# Patient Record
Sex: Male | Born: 1945 | Race: White | Hispanic: No | Marital: Single | Smoking: Former smoker
Health system: Southern US, Community
[De-identification: ages and names within clinical notes are randomized; demographics above are authoritative.]

## PROBLEM LIST (undated history)

## (undated) DIAGNOSIS — Z952 Presence of prosthetic heart valve: Secondary | ICD-10-CM

## (undated) DIAGNOSIS — R251 Tremor, unspecified: Secondary | ICD-10-CM

## (undated) DIAGNOSIS — IMO0002 Reserved for concepts with insufficient information to code with codable children: Secondary | ICD-10-CM

## (undated) DIAGNOSIS — E109 Type 1 diabetes mellitus without complications: Secondary | ICD-10-CM

## (undated) DIAGNOSIS — M1990 Unspecified osteoarthritis, unspecified site: Secondary | ICD-10-CM

## (undated) DIAGNOSIS — K635 Polyp of colon: Secondary | ICD-10-CM

## (undated) DIAGNOSIS — I4891 Unspecified atrial fibrillation: Secondary | ICD-10-CM

## (undated) DIAGNOSIS — F329 Major depressive disorder, single episode, unspecified: Secondary | ICD-10-CM

## (undated) DIAGNOSIS — N400 Enlarged prostate without lower urinary tract symptoms: Secondary | ICD-10-CM

## (undated) DIAGNOSIS — H269 Unspecified cataract: Secondary | ICD-10-CM

## (undated) DIAGNOSIS — E7800 Pure hypercholesterolemia, unspecified: Secondary | ICD-10-CM

## (undated) DIAGNOSIS — I10 Essential (primary) hypertension: Secondary | ICD-10-CM

## (undated) DIAGNOSIS — E890 Postprocedural hypothyroidism: Secondary | ICD-10-CM

## (undated) HISTORY — PX: APPENDECTOMY: SHX54

## (undated) HISTORY — DX: Reserved for concepts with insufficient information to code with codable children: IMO0002

## (undated) HISTORY — DX: Pure hypercholesterolemia, unspecified: E78.00

## (undated) HISTORY — DX: Benign prostatic hyperplasia without lower urinary tract symptoms: N40.0

## (undated) HISTORY — DX: Major depressive disorder, single episode, unspecified: F32.9

## (undated) HISTORY — DX: Presence of prosthetic heart valve: Z95.2

## (undated) HISTORY — DX: Unspecified cataract: H26.9

## (undated) HISTORY — PX: TONSILLECTOMY: SUR1361

## (undated) HISTORY — DX: Postprocedural hypothyroidism: E89.0

## (undated) HISTORY — DX: Essential (primary) hypertension: I10

## (undated) HISTORY — DX: Unspecified atrial fibrillation: I48.91

## (undated) HISTORY — PX: HEEL SPUR SURGERY: SHX665

## (undated) HISTORY — DX: Type 1 diabetes mellitus without complications: E10.9

## (undated) HISTORY — PX: OTHER SURGICAL HISTORY: SHX169

## (undated) HISTORY — PX: EYE SURGERY: SHX253

## (undated) HISTORY — DX: Morbid (severe) obesity due to excess calories: E66.01

## (undated) HISTORY — DX: Polyp of colon: K63.5

---

## 1998-08-27 ENCOUNTER — Observation Stay (HOSPITAL_COMMUNITY): Admission: EM | Admit: 1998-08-27 | Discharge: 1998-08-30 | Payer: Self-pay | Admitting: Emergency Medicine

## 1998-11-12 ENCOUNTER — Ambulatory Visit (HOSPITAL_COMMUNITY): Admission: RE | Admit: 1998-11-12 | Discharge: 1998-11-12 | Payer: Self-pay | Admitting: Gastroenterology

## 2000-06-26 ENCOUNTER — Ambulatory Visit (HOSPITAL_COMMUNITY): Admission: RE | Admit: 2000-06-26 | Discharge: 2000-06-26 | Payer: Self-pay | Admitting: Orthopedic Surgery

## 2000-06-26 ENCOUNTER — Encounter: Payer: Self-pay | Admitting: Orthopedic Surgery

## 2000-07-04 ENCOUNTER — Encounter (INDEPENDENT_AMBULATORY_CARE_PROVIDER_SITE_OTHER): Payer: Self-pay | Admitting: *Deleted

## 2000-07-04 ENCOUNTER — Encounter: Payer: Self-pay | Admitting: Orthopedic Surgery

## 2000-07-04 ENCOUNTER — Ambulatory Visit (HOSPITAL_COMMUNITY): Admission: RE | Admit: 2000-07-04 | Discharge: 2000-07-04 | Payer: Self-pay | Admitting: Orthopedic Surgery

## 2004-04-21 ENCOUNTER — Ambulatory Visit: Payer: Self-pay | Admitting: Cardiology

## 2004-05-13 ENCOUNTER — Ambulatory Visit: Payer: Self-pay | Admitting: Cardiology

## 2004-05-14 ENCOUNTER — Inpatient Hospital Stay (HOSPITAL_BASED_OUTPATIENT_CLINIC_OR_DEPARTMENT_OTHER): Admission: RE | Admit: 2004-05-14 | Discharge: 2004-05-14 | Payer: Self-pay | Admitting: Cardiology

## 2004-05-14 ENCOUNTER — Ambulatory Visit: Payer: Self-pay | Admitting: Cardiology

## 2004-05-19 ENCOUNTER — Encounter: Admission: RE | Admit: 2004-05-19 | Discharge: 2004-05-19 | Payer: Self-pay | Admitting: Dentistry

## 2004-05-19 ENCOUNTER — Ambulatory Visit: Payer: Self-pay | Admitting: Dentistry

## 2004-05-20 ENCOUNTER — Ambulatory Visit: Payer: Self-pay | Admitting: Dentistry

## 2004-05-20 ENCOUNTER — Ambulatory Visit (HOSPITAL_COMMUNITY): Admission: RE | Admit: 2004-05-20 | Discharge: 2004-05-20 | Payer: Self-pay | Admitting: Dentistry

## 2004-05-24 HISTORY — PX: DENTAL SURGERY: SHX609

## 2004-05-25 ENCOUNTER — Ambulatory Visit (HOSPITAL_COMMUNITY)
Admission: RE | Admit: 2004-05-25 | Discharge: 2004-05-25 | Payer: Self-pay | Admitting: Thoracic Surgery (Cardiothoracic Vascular Surgery)

## 2004-05-25 ENCOUNTER — Ambulatory Visit: Payer: Self-pay | Admitting: Cardiology

## 2004-05-25 ENCOUNTER — Encounter: Payer: Self-pay | Admitting: Cardiology

## 2004-05-26 ENCOUNTER — Ambulatory Visit: Payer: Self-pay | Admitting: Dentistry

## 2004-05-27 ENCOUNTER — Ambulatory Visit (HOSPITAL_COMMUNITY)
Admission: RE | Admit: 2004-05-27 | Discharge: 2004-05-27 | Payer: Self-pay | Admitting: Thoracic Surgery (Cardiothoracic Vascular Surgery)

## 2004-06-10 ENCOUNTER — Ambulatory Visit: Payer: Self-pay | Admitting: Cardiology

## 2004-07-13 ENCOUNTER — Ambulatory Visit: Payer: Self-pay | Admitting: Dentistry

## 2004-07-24 HISTORY — PX: AORTIC VALVE REPLACEMENT: SHX41

## 2004-08-05 ENCOUNTER — Ambulatory Visit: Payer: Self-pay | Admitting: Cardiology

## 2004-08-09 ENCOUNTER — Ambulatory Visit (HOSPITAL_COMMUNITY)
Admission: RE | Admit: 2004-08-09 | Discharge: 2004-08-09 | Payer: Self-pay | Admitting: Thoracic Surgery (Cardiothoracic Vascular Surgery)

## 2004-08-19 ENCOUNTER — Inpatient Hospital Stay (HOSPITAL_COMMUNITY)
Admission: RE | Admit: 2004-08-19 | Discharge: 2004-08-25 | Payer: Self-pay | Admitting: Thoracic Surgery (Cardiothoracic Vascular Surgery)

## 2004-08-19 ENCOUNTER — Encounter (INDEPENDENT_AMBULATORY_CARE_PROVIDER_SITE_OTHER): Payer: Self-pay | Admitting: *Deleted

## 2004-09-07 ENCOUNTER — Ambulatory Visit: Payer: Self-pay | Admitting: Cardiology

## 2004-09-13 ENCOUNTER — Ambulatory Visit: Payer: Self-pay | Admitting: Dentistry

## 2004-10-05 ENCOUNTER — Ambulatory Visit: Payer: Self-pay | Admitting: Dentistry

## 2004-10-26 ENCOUNTER — Ambulatory Visit: Payer: Self-pay | Admitting: Dentistry

## 2004-11-11 ENCOUNTER — Ambulatory Visit: Payer: Self-pay | Admitting: Cardiology

## 2004-11-30 ENCOUNTER — Ambulatory Visit: Payer: Self-pay | Admitting: Dentistry

## 2005-01-05 ENCOUNTER — Ambulatory Visit: Payer: Self-pay | Admitting: Cardiology

## 2005-07-13 ENCOUNTER — Ambulatory Visit: Payer: Self-pay | Admitting: Cardiology

## 2006-01-11 ENCOUNTER — Ambulatory Visit: Payer: Self-pay | Admitting: Cardiology

## 2006-01-13 ENCOUNTER — Ambulatory Visit: Payer: Self-pay

## 2006-02-06 ENCOUNTER — Ambulatory Visit (HOSPITAL_COMMUNITY): Admission: RE | Admit: 2006-02-06 | Discharge: 2006-02-06 | Payer: Self-pay | Admitting: Cardiology

## 2006-03-09 ENCOUNTER — Ambulatory Visit: Payer: Self-pay | Admitting: Endocrinology

## 2006-07-17 ENCOUNTER — Ambulatory Visit: Payer: Self-pay | Admitting: Cardiology

## 2007-04-16 ENCOUNTER — Ambulatory Visit: Payer: Self-pay | Admitting: Gastroenterology

## 2007-04-24 ENCOUNTER — Ambulatory Visit: Payer: Self-pay | Admitting: Gastroenterology

## 2007-04-24 HISTORY — PX: COLONOSCOPY: SHX174

## 2007-04-25 ENCOUNTER — Ambulatory Visit: Payer: Self-pay | Admitting: Cardiology

## 2007-06-27 ENCOUNTER — Ambulatory Visit: Payer: Self-pay | Admitting: Cardiology

## 2007-08-22 ENCOUNTER — Ambulatory Visit: Payer: Self-pay | Admitting: Cardiology

## 2007-10-18 ENCOUNTER — Telehealth: Payer: Self-pay | Admitting: Endocrinology

## 2008-01-15 ENCOUNTER — Ambulatory Visit: Payer: Self-pay

## 2008-04-30 ENCOUNTER — Ambulatory Visit: Payer: Self-pay | Admitting: Cardiology

## 2008-08-07 ENCOUNTER — Telehealth: Payer: Self-pay | Admitting: Endocrinology

## 2009-01-19 ENCOUNTER — Encounter: Payer: Self-pay | Admitting: Endocrinology

## 2009-01-22 ENCOUNTER — Ambulatory Visit: Payer: Self-pay | Admitting: Endocrinology

## 2009-01-22 DIAGNOSIS — E7800 Pure hypercholesterolemia, unspecified: Secondary | ICD-10-CM

## 2009-01-22 DIAGNOSIS — E109 Type 1 diabetes mellitus without complications: Secondary | ICD-10-CM | POA: Insufficient documentation

## 2009-01-22 DIAGNOSIS — F32A Depression, unspecified: Secondary | ICD-10-CM | POA: Insufficient documentation

## 2009-01-22 DIAGNOSIS — E785 Hyperlipidemia, unspecified: Secondary | ICD-10-CM | POA: Insufficient documentation

## 2009-01-22 DIAGNOSIS — I10 Essential (primary) hypertension: Secondary | ICD-10-CM

## 2009-01-22 DIAGNOSIS — F3289 Other specified depressive episodes: Secondary | ICD-10-CM

## 2009-01-22 DIAGNOSIS — E876 Hypokalemia: Secondary | ICD-10-CM | POA: Insufficient documentation

## 2009-01-22 DIAGNOSIS — F329 Major depressive disorder, single episode, unspecified: Secondary | ICD-10-CM

## 2009-01-22 DIAGNOSIS — E890 Postprocedural hypothyroidism: Secondary | ICD-10-CM

## 2009-01-22 HISTORY — DX: Other specified depressive episodes: F32.89

## 2009-01-22 HISTORY — DX: Pure hypercholesterolemia, unspecified: E78.00

## 2009-01-22 HISTORY — DX: Type 1 diabetes mellitus without complications: E10.9

## 2009-01-22 HISTORY — DX: Postprocedural hypothyroidism: E89.0

## 2009-01-22 HISTORY — DX: Essential (primary) hypertension: I10

## 2009-01-22 HISTORY — DX: Major depressive disorder, single episode, unspecified: F32.9

## 2009-01-30 ENCOUNTER — Telehealth: Payer: Self-pay | Admitting: Endocrinology

## 2009-04-03 ENCOUNTER — Ambulatory Visit: Payer: Self-pay | Admitting: Endocrinology

## 2009-04-07 ENCOUNTER — Telehealth: Payer: Self-pay | Admitting: Endocrinology

## 2009-04-10 ENCOUNTER — Telehealth: Payer: Self-pay | Admitting: Endocrinology

## 2009-05-01 ENCOUNTER — Telehealth: Payer: Self-pay | Admitting: Endocrinology

## 2009-05-27 ENCOUNTER — Ambulatory Visit: Payer: Self-pay | Admitting: Cardiology

## 2009-05-27 DIAGNOSIS — I359 Nonrheumatic aortic valve disorder, unspecified: Secondary | ICD-10-CM | POA: Insufficient documentation

## 2009-05-27 DIAGNOSIS — R011 Cardiac murmur, unspecified: Secondary | ICD-10-CM | POA: Insufficient documentation

## 2009-06-30 ENCOUNTER — Encounter: Payer: Self-pay | Admitting: Cardiology

## 2009-06-30 ENCOUNTER — Ambulatory Visit (HOSPITAL_COMMUNITY): Admission: RE | Admit: 2009-06-30 | Discharge: 2009-06-30 | Payer: Self-pay | Admitting: Cardiology

## 2009-06-30 ENCOUNTER — Ambulatory Visit: Payer: Self-pay | Admitting: Cardiovascular Disease

## 2009-06-30 ENCOUNTER — Ambulatory Visit: Payer: Self-pay

## 2009-06-30 ENCOUNTER — Encounter (INDEPENDENT_AMBULATORY_CARE_PROVIDER_SITE_OTHER): Payer: Self-pay

## 2010-01-19 ENCOUNTER — Telehealth: Payer: Self-pay | Admitting: Internal Medicine

## 2010-01-26 ENCOUNTER — Encounter: Payer: Self-pay | Admitting: Cardiology

## 2010-01-26 DIAGNOSIS — I6529 Occlusion and stenosis of unspecified carotid artery: Secondary | ICD-10-CM | POA: Insufficient documentation

## 2010-01-28 ENCOUNTER — Ambulatory Visit: Admission: RE | Admit: 2010-01-28 | Discharge: 2010-01-28 | Payer: Self-pay | Source: Home / Self Care

## 2010-01-28 ENCOUNTER — Encounter: Payer: Self-pay | Admitting: Cardiology

## 2010-02-13 ENCOUNTER — Encounter: Payer: Self-pay | Admitting: Family Medicine

## 2010-02-23 NOTE — Progress Notes (Signed)
Summary: elevated CBG  Phone Note Call from Patient Call back at Home Phone [phone]   Caller: Patient Summary of Call: pt called stating that since stopping Byetta his CBG have been elevated. pt states that CBG this am before breakfast 250 and 345 now. please advise Initial call taken by: Margaret Pyle, CMA,  April 07, 2009 10:12 AM  Follow-up for Phone Call        increase levemir to 120 units each am Follow-up by: Minus Breeding MD,  April 07, 2009 12:51 PM  Additional Follow-up for Phone Call Additional follow up Details #1::        pt informed Additional Follow-up by: Margaret Pyle, CMA,  April 07, 2009 1:42 PM    New/Updated Medications: LEVEMIR FLEXPEN 100 UNIT/ML SOLN (INSULIN DETEMIR) 120 units qam

## 2010-02-23 NOTE — Miscellaneous (Signed)
Summary: IV for Definity Echo  Clinical Lists Changes     IV 20G angiocath (R) AC for Definity Echo. Conner Muegge,RN.

## 2010-02-23 NOTE — Letter (Signed)
Summary: [facility]  [facility]   Imported By: Lester [place] 01/28/2009 10:48:08  _____________________________________________________________________  External Attachment:    Type:   Image     Comment:   External Document

## 2010-02-23 NOTE — Assessment & Plan Note (Signed)
Summary: FOLLOW UP-LB    Vital Signs:  Patient profile:   65 year old male Height:      73 inches (185.42 cm) Weight:      296.38 pounds (134.72 kg) O2 Sat:      96 % on Room air Temp:     96.5 degrees F (35.83 degrees C) oral Pulse rate:   84 / minute BP sitting:   116 / 60  (right arm) Cuff size:   large  Vitals Entered ByDebby Freiberg Christopher(April 03, 2009 9:53 AM)  O2 Flow:  Room air CC: 1 month F/U   Referring Provider:  Gerome Sam Primary Provider:  Gerome Sam  CC:  1 month F/U.  History of Present Illness: pt says he has increased the levemir to 82 units each am.  he brings a record of his cbg's which i have reviewed today.  it varies from 126-238.  it is in general higher as the day goes on.  he doesn't feel the byetta is helping.  Current Medications (verified): 1)  Furosemide 40 Mg Tabs (Furosemide) .Marland Kitchen.. 1 By Mouth Daily 2)  Klor-Con 10 10 Meq Cr-Tabs (Potassium Chloride) .Marland Kitchen.. 1 By Mouth Two Times A Day 3)  Levothyroxine 0.088 Mg .Marland Kitchen.. 1 Tab Daily 4)  Zocor 40 Mg Tabs (Simvastatin) .... Once Daily 5)  Acarbose 100mg  .... 1/2 Tab Daily 6)  Citalo Pram 20 Mg .... Once Daily 7)  Metoprolol Tartrate 12 1/2 Mg .Marland Kitchen.. 1 Tab Two Times A Day 8)  Lisinopril 20 Mg Tabs (Lisinopril) .Marland Kitchen.. 1 Tab Daily 9)  Byetta 10 Mcg Pen 10 Mcg/0.74ml Soln (Exenatide) .... Two Times A Day 10)  Metformin Ncl 1000mg  .... 2 Tab Daily 11)  Levemir Flexpen 100 Unit/ml Soln (Insulin Detemir) .... 70 Units Qam  Allergies (verified): 1)  ! 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    Physical Exam  General:  morbidly obese.   Extremities:  trace right pedal edema and trace left pedal edema.     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 90 units qam  Other Orders: Est. Patient Level III ([address])  Patient Instructions: 1)  check your blood sugar 2 times a day.  vary the time of day when you check, between before the 3 meals, and at bedtime.  also check if you have symptoms of your blood sugar being too high or too low.  please keep a record of the readings and bring it to your next appointment here.  please call us sooner if you are having low blood sugar episodes. 2)  increase levemir to 90 units each am. 3)  continue to increase levemir every few days, by 5 units at a time, until blood sugar at some time of day is approx 100.  call us if you have questions or problems with this. 4)  return 1 month. 5)  stop byetta.

## 2010-02-23 NOTE — Progress Notes (Signed)
Summary: Increased Sugar  Phone Note Call from Patient Call back at Home Phone [phone]   Summary of Call: Rtn patient call to triage re: DM. Per the patient he was seen last week and he was taken off a couple of his meds. He is having trouble with his levels being to high, this AM it was 212. Patient ? should he increase his Levemir or Acarbose? If so, by how much? Please advise. Initial call taken by: Lucious Groves,  January 30, 2009 8:52 AM  Follow-up for Phone Call        please verify that his levemir is currently 60 units qam.  if so, increase to 70 units qam. Follow-up by: Minus Breeding MD,  January 30, 2009 9:02 AM  Additional Follow-up for Phone Call Additional follow up Details #1::        Spoke with patient and he is taking 65units at night. Patient will increase it to 70 and call me with update on Monday. Additional Follow-up by: Lucious Groves,  January 30, 2009 9:14 AM    New/Updated Medications: LEVEMIR FLEXPEN 100 UNIT/ML SOLN (INSULIN DETEMIR) 70 units qam

## 2010-02-23 NOTE — Progress Notes (Signed)
Summary: CBGs  Phone Note Call from Patient Call back at Home Phone [phone]   Caller: Patient Summary of Call: pt called stating his CBGs are still elevated at 285 this morning (8a) before and after eating. pt report no recent cold, no new OTC meds but does have allergies but has not used any med fo it. pt is concerned that his discontinution of Byetta could be the reason he cannot control his CBGs. Pt advised that MD may need pt to make ROV to eval. Pt states that he may not be home and does not have an answering machine. If I am unable to contact him, he will call back Monday morning. Initial call taken by: Margaret Pyle, CMA,  April 10, 2009 9:35 AM  Follow-up for Phone Call        increase levemir to 140 units each am Follow-up by: Minus Breeding MD,  April 10, 2009 9:40 AM  Additional Follow-up for Phone Call Additional follow up Details #1::        pt informed and will folow MD instruction, re-check CBG and call abck if needed Additional Follow-up by: Margaret Pyle, CMA,  April 10, 2009 10:03 AM    New/Updated Medications: LEVEMIR FLEXPEN 100 UNIT/ML SOLN (INSULIN DETEMIR) 140 units qam

## 2010-02-23 NOTE — Progress Notes (Signed)
Summary: clarification  Phone Note From Pharmacy   Caller: [facility]* Summary of Call: i recd note from pharmacy stating that the patient told them the Levemir is supposed to be 160u once daily. The computer says 140u once daily. Please advise. Initial call taken by: Lucious Groves,  May 01, 2009 2:34 PM  Follow-up for Phone Call        emr is correct (140) Follow-up by: Minus Breeding MD,  May 01, 2009 2:42 PM    Prescriptions: LEVEMIR FLEXPEN 100 UNIT/ML SOLN (INSULIN DETEMIR) 140 units qam  #2 boxes x 5   Entered by:   Margaret Pyle, CMA   Authorized by:   Minus Breeding MD   Signed by:   Margaret Pyle, CMA on 05/01/2009   Method used:   Electronically to        [facility]* (retail)       [address]       [place], [place]  [address]       Ph: [phone]       Fax: [phone]   RxID:   [id number]

## 2010-02-23 NOTE — Assessment & Plan Note (Signed)
Summary: [facility]  Medications Added KLOR-CON 10 10 MEQ CR-TABS (POTASSIUM CHLORIDE) 1daily * ACARBOSE 100MG  1/2 tab three times a day METOPROLOL TARTRATE 25 MG TABS (METOPROLOL TARTRATE) 1/2 by mouth two times a day METFORMIN HCL 1000 MG TABS (METFORMIN HCL) 2 by mouth daily LEVEMIR FLEXPEN 100 UNIT/ML SOLN (INSULIN DETEMIR) 60 units qam ONGLYZA 5 MG TABS (SAXAGLIPTIN HCL) daily      Allergies Added:   Visit Type:  Follow-up Primary Provider:  Helene Kelp, PA  CC:  chest pain.  History of Present Illness: The patient presents for yearly followup of his aortic valve status post replacement.He has done well over the past year particularly after stopping Actos and losing 30 pounds. He said that this medicine was making him retain fluid and he has been breathing better off of it though his blood sugar was more difficult to control. He denies any PND or orthopnea. He's had no palpitations, presyncope or syncope. He denies any chest discomfort. He does do some walking for exercise.  Current Medications (verified): 1)  Furosemide 40 Mg Tabs (Furosemide) .Marland Kitchen.. 1 By Mouth Daily 2)  Klor-Con 10 10 Meq Cr-Tabs (Potassium Chloride) .Marland Kitchen.. 1daily 3)  Levothyroxine 0.088 Mg .Marland Kitchen.. 1 Tab Daily 4)  Zocor 40 Mg Tabs (Simvastatin) .... Once Daily 5)  Acarbose 100mg  .... 1/2 Tab Three Times A Day 6)  Citalo Pram 20 Mg .... Once Daily 7)  Metoprolol Tartrate 25 Mg Tabs (Metoprolol Tartrate) .... 1/2 By Mouth Two Times A Day 8)  Lisinopril 20 Mg Tabs (Lisinopril) .Marland Kitchen.. 1 Tab Daily 9)  Metformin Hcl 1000 Mg Tabs (Metformin Hcl) .... 2 By Mouth Daily 10)  Levemir Flexpen 100 Unit/ml Soln (Insulin Detemir) .... 60 Units Qam 11)  Onglyza 5 Mg Tabs (Saxagliptin Hcl) .... Daily  Allergies (verified): 1)  ! Actos (Pioglitazone Hcl)  Past History:  Past Medical History: Aortic stenosis, hypertension, dyslipidemia, diabetes mellitus, and morbid obesity.      Past Surgical History:  1.   Appendectomy.  2.  Resection of a heel spur.  3.  Dental extractions May 2006.  4.  Aortic valve replacement  (July 2006 , #20  stentless [place] porcine valve)  Review of Systems       As stated in the HPI and negative for all other systems.   Vital Signs:  Patient profile:   65 year old male Height:      73 inches Weight:      300 pounds BMI:     39.72 Pulse rate:   68 / minute Resp:     18 per minute BP sitting:   118 / 76  (right arm)  Vitals Entered By: Marrion Coy, CNA (May 27, 2009 10:36 AM)  Physical Exam  General:  Well developed, well nourished, in no acute distress. Head:  normocephalic and atraumatic Eyes:  PERRLA/EOM intact; conjunctiva and lids normal. Neck:  Neck supple, no JVD. No masses, thyromegaly or abnormal cervical nodes. Chest Wall:  Well-healed sternotomy scar Lungs:  Clear bilaterally to auscultation and percussion. Abdomen:  Bowel sounds positive; abdomen soft and non-tender without masses, organomegaly, or hernias noted. No hepatosplenomegaly, morbidly obese Msk:  Back normal, normal gait. Muscle strength and tone normal. Extremities:  mild bilateral edema Neurologic:  Alert and oriented x 3. Skin:  Intact without lesions or rashes. Cervical Nodes:  no significant adenopathy Axillary Nodes:  no significant adenopathy Inguinal Nodes:  no significant adenopathy Psych:  Normal affect.   Detailed Cardiovascular Exam  Neck    Carotids: Carotids full and equal bilaterally without bruits.      Neck Veins: Normal, no JVD.    Heart    Inspection: no deformities or lifts noted.      Palpation: normal PMI with no thrills palpable.      Auscultation: S1 and S2 within normal limits, no S3, no clicks, no rubs, 2/6 apical systolic murmur radiating slightly up the aortic outflow tract and early peaking, no diastolic murmurs  Vascular    Abdominal Aorta: no palpable masses, pulsations, or audible bruits.      Femoral Pulses: normal femoral pulses  bilaterally.      Pedal Pulses: normal pedal pulses bilaterally.      Radial Pulses: normal radial pulses bilaterally.      Peripheral Circulation: no clubbing, cyanosis, or edema noted with normal capillary refill.     EKG  Procedure date:  05/27/2009  Findings:      sinus rhythm, rate 68, leftward axis, nonspecific high lateral T-wave changes, poor anterior R-wave progression  Impression & Recommendations:  Problem # 1:  AORTIC VALVE REPLACEMENT, HX OF (ICD-V43.3) It has now been 5 years since his valve replacement. His murmur is slightly more pronounced than I recall. I will check an echocardiogram. Orders: Echocardiogram (Echo)  Problem # 2:  HYPERTENSION (ICD-401.9) His blood pressure is controlled and he will continue the meds as listed. Orders: EKG w/ Interpretation ([address])  Problem # 3:  HYPERCHOLESTEROLEMIA (ICD-272.0) This is followed by his primary physician with a goal LDL less than 100 and HDL greater than 40. Orders: EKG w/ Interpretation ([address])  Patient Instructions: 1)  Your physician recommends that you schedule a follow-up appointment in: 12 months with Dr Antoine Poche 2)  Your physician recommends that you continue on your current medications as directed. Please refer to the Current Medication list given to you today. 3)  Your physician has requested that you have an echocardiogram.  Echocardiography is a painless test that uses sound waves to create images of your heart. It provides your doctor with information about the size and shape of your heart and how well your heart's chambers and valves are working.  This procedure takes approximately one hour. There are no restrictions for this procedure.

## 2010-02-25 NOTE — Miscellaneous (Signed)
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 

## 2010-02-25 NOTE — Progress Notes (Signed)
Summary: low CBG/SAE pt  Phone Note Call from Patient Call back at Home Phone [phone]   Caller: Patient Summary of Call: Pt called stating he has been experiencing extremely low CBGs at 40-60 in the am. Pt says he has been taking Levemir 140u two times a day. I advised pt that per SAE he should only take insulin 140u once daily and Dr Hockrein's office lowered dosage to 60u once daily. Pt stated that he will take 140u once daily. I advised pt that he should make follow up appt with SAE but he declined stating he would call back. Initial call taken by: Margaret Pyle, CMA,  January 19, 2010 1:52 PM  Follow-up for Phone Call        ok - should keep ROV SAE as advised Follow-up by: Newt Lukes MD,  January 19, 2010 2:28 PM

## 2010-03-15 ENCOUNTER — Telehealth: Payer: Self-pay | Admitting: Endocrinology

## 2010-03-23 NOTE — Progress Notes (Signed)
Summary: increase in CBGs  Phone Note Call from Patient Call back at [facility] Phone [phone]   Summary of Call: Pt left vm - req a call back regarding his blood sugar.  Initial call taken by: Lamar Sprinkles, CMA,  March 15, 2010 1:55 PM  Follow-up for Phone Call        Pt called stating that his CBGs have been averaging 130s for the last couple weeks and his averages previously were in the 80-90s. Pt stats he was sick with sinus infection last week and is now getting over it. Pt is concerned about increase in sugars, please advise. Follow-up by: Margaret Pyle, CMA,  March 15, 2010 2:27 PM  Additional Follow-up for Phone Call Additional follow up Details #1::        ov is due.  let's address then. Additional Follow-up by: Minus Breeding MD,  March 15, 2010 2:28 PM    Additional Follow-up for Phone Call Additional follow up Details #2::    Pt advised per MD and will call back to schedule appt tomorrow Follow-up by: Margaret Pyle, CMA,  March 15, 2010 3:40 PM

## 2010-04-15 ENCOUNTER — Encounter: Payer: Self-pay | Admitting: Physician Assistant

## 2010-04-15 DIAGNOSIS — N400 Enlarged prostate without lower urinary tract symptoms: Secondary | ICD-10-CM | POA: Insufficient documentation

## 2010-04-15 HISTORY — DX: Benign prostatic hyperplasia without lower urinary tract symptoms: N40.0

## 2010-05-13 ENCOUNTER — Ambulatory Visit (INDEPENDENT_AMBULATORY_CARE_PROVIDER_SITE_OTHER): Payer: Medicare Other | Admitting: Endocrinology

## 2010-05-13 ENCOUNTER — Encounter: Payer: Self-pay | Admitting: Endocrinology

## 2010-05-13 VITALS — BP 114/64 | HR 82 | Temp 98.3°F | Ht 73.0 in | Wt 313.0 lb

## 2010-05-13 DIAGNOSIS — E109 Type 1 diabetes mellitus without complications: Secondary | ICD-10-CM

## 2010-05-13 DIAGNOSIS — L97909 Non-pressure chronic ulcer of unspecified part of unspecified lower leg with unspecified severity: Secondary | ICD-10-CM

## 2010-05-13 NOTE — Patient Instructions (Addendum)
check your blood sugar 3 times a day.  vary the time of day when you check, between before the 3 meals, and at bedtime.  also check if you have symptoms of your blood sugar being too high or too low.  please keep a record of the readings and bring it to your next appointment here.  please call us sooner if you are having low blood sugar episodes. Stop onglyza and metformin. Increase levemir to 73 units 2x a day For the area on your leg:  2x a day, gently wash it and pat dry.  Place antibiotic ointment and a large bandaid.  Do this until it heals, and call us if not.  Please make a follow-up appointment in 3 months

## 2010-05-13 NOTE — Progress Notes (Signed)
  Subjective:    Patient ID: [name], male    DOB: [birth year], 65 y.o.   MRN: [id number]  HPI Pt says his cbg's were well-controlled, until 3 months ago.  He is uncertain why it has increased since then.  he brings a record of his cbg's which i have reviewed today.  It varies form 170-210.  It is in general higher later in the day.   He reports 1 month of a slight ulcer at the right leg.  No assoc itching. Past Medical History  Diagnosis Date  . HYPERTENSION 01/22/2009  . 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 BPH (benign prostatic hypertrophy) 04/15/2010  . AORTIC VALVE REPLACEMENT, HX OF 05/27/2009  . DEPRESSION 01/22/2009  . HYPERCHOLESTEROLEMIA 01/22/2009  . 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 Aortic stenosis   . Morbid obesity    Past Surgical History  Procedure Date  . Appendectomy   . Aortic valve replacement July 2006    #20 stentless [place] porcine valve  . Dental surgery 05/2004    Dental extractions  . Heel spur surgery     resection of heel spur    reports that he has quit smoking. He does not have any smokeless tobacco history on file. He reports that he does not drink alcohol. His drug history not on file. family history is not on file. Allergies  Allergen Reactions  . Phenothiazines Anaphylaxis  . Pioglitazone     REACTION: edema    Review of Systems denies hypoglycemia and fever    Objective:   Physical Exam Pulses: dorsalis pedis intact bilat.   Feet: no deformity.  no ulcer on the feet.  feet are of normal color and temp.  There is 1+ bilat leg edema. There is a 1 cm shallow ulcer at the right anterior tibial area. Neuro: sensation is intact to touch on the feet     (pt says a1c was 7.6%, approx 3 weeks ago).   Assessment & Plan:  Dm, needs increased rx. Leg ulcer, new.

## 2010-05-17 ENCOUNTER — Telehealth: Payer: Self-pay

## 2010-05-17 NOTE — Telephone Encounter (Signed)
Increase to 80 units bid Call if it does not improve.

## 2010-05-17 NOTE — Telephone Encounter (Signed)
Pt advised and will monitor CBGs and call back in 1 week if not improved

## 2010-05-17 NOTE — Telephone Encounter (Signed)
Pt called stating his CBGs have been elevated since last OV, 300+ Saturday, 300 Sunday and 204 this morning. Pt says her spoke with his PCP who advised him to increase insulin to 75 units twice daily which he started today. Pt is requesting advisement.

## 2010-05-25 ENCOUNTER — Encounter: Payer: Self-pay | Admitting: Endocrinology

## 2010-05-26 ENCOUNTER — Encounter: Payer: Self-pay | Admitting: Endocrinology

## 2010-06-08 ENCOUNTER — Encounter: Payer: Self-pay | Admitting: Cardiology

## 2010-06-08 NOTE — Assessment & Plan Note (Signed)
[name]                            CARDIOLOGY OFFICE NOTE   NAME:[name], [name]                     MRN:          [id number]  DATE:06/27/2007                            DOB:          05/13/1945    PRIMARY CARE PHYSICIAN:  [name], M.D.   REASON FOR PRESENTATION:  A patient with dyspnea and aortic stenosis  status post valve replacement.   HISTORY OF PRESENT ILLNESS:  The patient is a 65 year old who I saw in  early April for increased dyspnea.  I did obtain the ultrasound that was  done in [place].  It demonstrated LVH with mild LV dysfunction and EF of  50%.  His bioprosthetic aortic valve seemed to be working well without  significant gradient.  I did get a BNP level that was slightly elevated  at 293.  I started him on 20 mg of Lasix and 10 of potassium.  He had a  followup BMET which was fine.  He said that since this time he has had a  little less dyspnea.  However, he still gets short of breath walking  moderate distance on level ground.  He is not describing any resting  shortness of breath and has no PND or orthopnea.  He has actually had a  weight gain though he says he is trying not to eat as much.  He denies  any chest pressure, neck or arm discomfort.  He has had no palpitations,  presyncope or syncope.   PAST MEDICAL HISTORY:  Aortic stenosis status post aortic valve  replacement (August 19, 2004, with a #20 [place] stentless porcine valve),  hypertension, hyperlipidemia, non-insulin-dependent diabetes mellitus,  morbid obesity.   ALLERGIES:  PHENERGAN.   MEDICATIONS:  1. Metoprolol 12.5 mg p.o. b.i.d.  2. Lisinopril 40 mg daily.  3. Glyburide 5 mg daily.  4. Januvia 100 mg daily.  5. Metformin 2000 mg daily.  6. Zocor 40 mg daily  7. Acarbose 50 mg daily.  8. Actos 45 mg daily.  9. Lasix 20 mg daily.  10.Potassium 10 mEq daily.   REVIEW OF SYSTEMS:  As stated in the HPI and otherwise negative for  other  systems.   PHYSICAL EXAMINATION:  GENERAL:  The patient is in no distress.  VITAL SIGNS:  Blood pressure 120/66, heart rate 80 and regular, weight  133 pounds.  HEENT:  Eyes unremarkable.  Pupils equal, round, reactive to light.  Fundi not visualized.  NECK:  No jugular vein distention at 45 degrees.  Carotid upstroke brisk  and symmetrical.  No bruits, thyromegaly.  LYMPHATICS:  No adenopathy.  LUNGS:  Clear to auscultation bilaterally.  BACK:  No costovertebral angle tenderness.  CHEST:  Well-healed sternotomy scar.  HEART:  PMI not displaced or sustained, S1-S2 within normal limits.  No  S3-S4, 2/6 apical systolic murmur radiating at the aortic outflow tract  best heard at the right upper sternal border, early peaking, no  diastolic murmurs.  ABDOMEN:  Morbidly obese, positive bowel sounds, normal in frequency and  pitch, no bruits, no rebound, no guarding, no midline pulsatile mass, no  organomegaly.  SKIN:  No rash or nodules.  EXTREMITIES:  2+ pulses, mild bilateral lower extremity edema.  NEURO:  Grossly intact.   ASSESSMENT AND PLAN:  1. Dyspnea.  The patient's dyspnea is probably multifactorial and      related to his weight.  He probably does have some component of      diastolic dysfunction and very slight systolic dysfunction.  I am      going to increase his Lasix to 40 mg a day.  I think he will do      well with the same amount of potassium.  We will check a BMET in 2      weeks.  He understands salt and fluid restriction.  2. Morbid obesity.  Again I think this is a significant problem.  We      discussed potential bariatric surgery.  He is going to begin to      investigate this.  3. Aortic valve replacement.  The patient seems to have a well-      functioning prosthesis.  No further evaluation is warranted.  4. Followup.  I will see him back in about 6 weeks or sooner if      needed.     Rollene Rotunda, MD, [name]  Electronically Signed    JH/MedQ  DD:  06/27/2007  DT: 06/27/2007  Job #: [id number]   cc:   [name], M.D.

## 2010-06-08 NOTE — Assessment & Plan Note (Signed)
[facility]                            CARDIOLOGY OFFICE NOTE   NAME:[name], [name] LEFKOWITZ                     MRN:          [id number]  DATE:07/17/2006                            DOB:          04/22/1945    PRIMARY CARE PHYSICIAN:  Lorin Picket Long, PAC   REASON FOR PRESENTATION:  Evaluate patient with aortic stenosis, status  post valve replacement.   HISTORY OF PRESENT ILLNESS:  The patient is 65 years old. He presents  for followup. He has done well since I last saw him. He has had no new  cardiac symptoms. He has had no chest discomfort, neck or arm  discomfort. He has had no palpitations, pre-syncope or syncope. He has  had no decrease in exercise tolerance. He has had some mild lower  extremity swelling which has been chronic. He says he cannot lose weight  despite dieting. He is also exercising.   PAST MEDICAL HISTORY:  1. Aortic stenosis, status post aortic valve replacement August 19, 2004      with a #21 [place] stentless porcine valve.  2. Hypertension.  3. Hyperlipidemia.  4. Non-insulin dependent diabetes mellitus.  5. Obesity.   ALLERGIES:  PHENERGAN.   CURRENT MEDICATIONS:  1. Metoprolol 12.5 mg b.i.d.  2. Lisinopril 40 mg daily.  3. Glyburide 5 mg daily.  4. Januvia.  5. Metformin.  6. Zocor 40 mg daily.  7. Acarbose 50 mg daily.  8. Actos 45 mg daily.  9. Potassium 20 mEq daily.   REVIEW OF SYSTEMS:  As stated in the HPI and otherwise negative for  other systems.   PHYSICAL EXAMINATION:  The patient is in no distress. Blood pressure is  138/80, heart rate 70 and regular. Weight is 321 pounds. Body Mass Index  is 41.  NECK: No jugular venous distention at 45 degrees. Carotid upstroke brisk  and symmetrical. No bruits, no thyromegaly.  LYMPHATICS: No lymphadenopathy.  LUNGS: Clear to auscultation bilaterally.  CHEST: Well-healed sternotomy scar.  HEART: PMI not displaced or sustained. S1, S2 within normal limits. No  S3. No  S4.  A 3/6 apical systolic murmur radiating out the aortic  outflow tract. No diastolic murmurs.  ABDOMEN: Obese, positive bowel sounds, normal in frequency and pitch. No  bruits. No rebound. No guarding. No midline pulsatile mass. No  organomegaly.  SKIN: No rashes, no nodules.  EXTREMITIES: 2+ pulses. Mild edema.  NEURO: Grossly intact.   EKG:  Sinus rhythm. Rate is 70. Axis within normal limits, intervals  within normal limits. No acute ST-T wave changes.   ASSESSMENT/PLAN:  1. Aortic stenosis. The patient is having no symptoms related to this.      He has done well since valve replacement. He will continue with      physical examination followup.  2. Obesity. We discussed the fact that he can lose weight, that he      just has to find the right balance of calories in to calories out.      I have suggested to him calorie counting.  3. Lower extremity swelling. He requests some compression stockings. I  have written him a prescription for this.  4. Carotid bruit. The patient actually probably had a transmitted      systolic murmur rather than carotid bruit. He had some mild plaque.      He is due to have another followup carotid in December of 2009.  5. Thyroid nodules. I will send an email to Dr.  Everardo All who knows of      his multi-nodular goiter to see if any further imaging is      warranted. He follows him for his thyroid problems and diabetes.  6. Followup. Will see the patient back in one year or sooner if      needed.     Rollene Rotunda, MD, [facility]  Electronically Signed    JH/MedQ  DD: 07/17/2006  DT: 07/17/2006  Job #: [id number]   cc:   Lindaann Pascal, PAC  Sean A. Everardo All, MD

## 2010-06-08 NOTE — Assessment & Plan Note (Signed)
[facility]                            CARDIOLOGY OFFICE NOTE   NAME:[name], [name]                     MRN:          [id number]  DATE:04/25/2007                            DOB:          07-16-1945    PRIMARY CARE PHYSICIAN:  Delaney Meigs, M.D.   REASON FOR PRESENTATION:  Evaluate patient with dyspnea on exertion and  aortic stenosis status post valve replacement.   HISTORY OF PRESENT ILLNESS:  The patient is now 65 years old.  He was  due to see me in May; however, he has had increasing dyspnea on  exertion.  He reports having a chest x-ray by Dr. Lysbeth Galas and being  referred here for follow up.  He also reports that he saw Dr. Kirstie Peri a couple of months ago.  He is no longer following him as his  primary care physician.  However, he did do an echocardiogram in his  office.   The patient says he is getting slowly more dyspneic with exertion.  He  says he walks halfway around the park in town and he has to  stop.  I am  not exactly sure how far this is.  He is not describing any resting  shortness breath.  He does not have any PND or orthopnea.  He has not  been having any palpitations, no presyncope or syncope.  He has not been  having any chest pain.  He said he is trying to lose weight, but he  continues actually to gained weight.  I am not sure that he is compliant  with diet.   PAST MEDICAL HISTORY:  1. Aortic stenosis status post aortic valve replacement (August 19, 2004      with a number 20 [place] stentless porcine valve).  2. Hypertension.  3. Hyperlipidemia.  4. Noninsulin-dependent diabetes mellitus.  5. Obesity.   ALLERGIES:  PHENERGAN.   MEDICATIONS:  1. Metoprolol 12.5 mg b.i.d.  2. Lisinopril 40 mg daily.  3. Glyburide 5 mg daily.  4. Januvia 100 mg daily.  5. Metformin 2000 mg daily.  6. Zocor 40 mg daily.  7. Acarbose 50 mg daily.  8. Actos 45 mg daily.  9. Klor-Con 20 mEq daily.   REVIEW OF SYSTEMS:  As  stated in the HPI and otherwise negative for  other systems.   PHYSICAL EXAMINATION:  GENERAL:  The patient is in no distress.  VITAL SIGNS:  Blood pressure 126/66, heart rate 53 and regular, weight  325 pounds, body mass index 41.  HEENT:  Eyes are unremarkable.  Pupils equal, round and reactive to  light.  Fundi not visualized.  Oral mucosa unremarkable.  NECK:  No jugular distention at 45 degrees.  Carotid upstroke brisk and  symmetric.  Positive bruits versus transmitted systolic murmurs.  No  thyromegaly.  LYMPHATICS:  No cervical, axillary, inguinal adenopathy.  LUNGS:  Clear to auscultation bilaterally.  BACK:  No costovertebral angle tenderness.  CHEST:  Well-healed sternotomy scar.  HEART:  PMI not displaced or sustained.  S1 and S2 within normal limits.  No S3, no S4.  A 2/6 apical systolic murmur radiating out the aortic  outflow tract and best heard in the right upper sternal border, early  peaking, no diastolic murmurs.  ABDOMEN:  Morbidly obese.  Positive bowel sounds normal in frequency and  pitch.  No bruits, rebound, guarding or midline pulsatile mass.  No  hepatomegaly, no splenomegaly.  SKIN:  No rashes, no nodules.  EXTREMITIES:  2+ pulses throughout.  Mild bilateral lower extremity  edema.  NEUROLOGIC:  Oriented to place, place and time.  Cranial nerves II-XII  grossly intact.  Motor grossly intact.   ELECTROCARDIOGRAM:  Sinus rhythm, rate 63.  Axis within normal limits,  intervals within normal limits, no acute ST-T wave changes.   ASSESSMENT/PLAN:  1. Dyspnea.  The patient's dyspnea is I think unlikely to be related      to his valve replacement.  I think it is more related to his morbid      obesity and deconditioning.  I am going to check a BNP level.  I am      going to get the results of the echo that he had done a couple of      months ago.  Provided that his BMP level is normal and his echo      shows no significant stenosis, I would not plan any  further cardiac      evaluation of his dyspnea.  2. Carotid stenosis.  The patient had some mild carotid plaquing in      the past.  He is due to have this followed up in the next 9-12      months.  3. Thyroid nodule.  This was picked up incidentally and is followed by      Dr. Everardo All.  The patient informs me that Dr. Everardo All thinks it is      benign.  4. Morbid obesity.  This is the patient's most significant problem.  I      am not sure that he complies with any dietary restrictions.  5. Diabetes.  He said his diabetes is not well controlled, but he has      had his medications adjusted recently.  6. Follow-up.  I would see him back in 1 year if the echocardiogram      looks okay and his BNP level is normal, unless he has any acute      change in his symptoms.     Rollene Rotunda, MD, [facility]  Electronically Signed    JH/MedQ  DD: 04/25/2007  DT: 04/25/2007  Job #: [id number]   cc:   Delaney Meigs, M.D.

## 2010-06-08 NOTE — Assessment & Plan Note (Signed)
[facility]                            CARDIOLOGY OFFICE NOTE   NAME:[name] [name] ATES                     MRN:          [id number]  DATE:04/30/2008                            DOB:          [birth year]    PRIMARY CARE PHYSICIAN:  [name] Kelp, PA, [facility].   REASON FOR PRESENTATION:  Evaluate the patient with aortic stenosis.   HISTORY OF PRESENT ILLNESS:  The patient returns for followup of the  above.  Since the last time I saw him, he has had no new complaints.  He  unfortunately continues to gain weight.  However, despite this he has  had no new shortness of breath.  He denies any PND or orthopnea.  He has  had no palpitation, presyncope, or syncope.  He denies any chest pain.  He says he walks once every 3-4 days.  He says he is limited by back  pain.  With this activity, he has no new symptoms.  He has started  Levemir since I last saw him.   PAST MEDICAL HISTORY:  Aortic stenosis (status post aortic valve  replacement, August 19, 2004, with #20 [place] Stentless Porcine Valve),  hypertension, dyslipidemia, diabetes mellitus, and morbid obesity.   ALLERGIES:  PHENERGAN.   MEDICATIONS:  1. Metoprolol 12.5 mg b.i.d.  2. Lisinopril 40 mg daily.  3. Januvia 100 mg daily.  4. Metformin 2000 mg daily.  5. Zocor 40 mg daily.  6. Acarbose 50 mg daily.  7. Lasix 40 mg daily.  8. Levothyroxine 0.88 mg daily.  9. Glyburide 10 mg daily.  10.Potassium 10 mEq b.i.d.  11.Citalopram.  12.Levemir 12 units daily.  13.Fish oil.   REVIEW OF SYSTEMS:  As stated in the HPI and otherwise negative for all  other systems.   PHYSICAL EXAMINATION:  GENERAL:  The patient is in no distress.  VITAL SIGNS:  Blood pressure 120/66, heart rate 72 and regular, and  weight 322 pounds.  HEENT:  Eyelids unremarkable, pupils equal, round, and reactive to  light, fundi not visualized, oral mucosa unremarkable.  NECK:  No jugular venous  distension at 45 degrees, carotid upstroke  brisk and symmetric, no bruits, no thyromegaly.  LYMPHATICS:  No cervical, axillary, or inguinal adenopathy.  LUNGS:  Clear to auscultation bilaterally.  BACK:  No costovertebral angle tenderness.  CHEST:  Well-healed sternotomy scar.  HEART:  PMI not displaced or sustained, S1 and S2 within normal limits,  no S3, no S4, 2/6 apical systolic murmur radiating up the aortic outflow  tract and audible in the right upper sternal border, no diastolic  murmurs.  ABDOMEN:  Morbidly obese, positive bowel sounds, normal in frequency and  pitch.  No bruits, no rebound, no guarding.  No midline pulsatile mass.  No hepatomegaly, no splenomegaly.  SKIN:  No rashes, no nodules.  EXTREMITIES:  2+ pulses throughout, no edema, no cyanosis, no clubbing.  NEUROLOGIC:  Oriented to person, place, and time.  Cranial nerves II  through XII grossly intact.  Motor grossly intact.   EKG, sinus rhythm, rate 72, axis within normal limits, intervals  within  normal limits, poor anterior R-wave progression, no acute ST-T wave  changes.   ASSESSMENT AND PLAN:  1. Aortic stenosis.  The patient is status post aortic valve      replacement.  He has no symptoms related to this.  No further      cardiovascular testing is suggested.  He understands endocarditis      prophylaxis.  2. Obesity.  We spent quite a bit of time discussing this.  He really      needs to count calories and restrict these.  I discussed increasing      his activity level.  Hopefully, he can comply.  3. Hypertension.  Blood pressure is well controlled and he will      continue the meds as listed.  4. Dyslipidemia.  I did review this.  He had a reasonable lipid      profile, although his HDL was slightly low at 34.  Again, increased      exercise should help with this.  His LDL was 62.  He will continue      on the meds as listed.  5. Follow up.  I will see him back in 1 year or sooner if he has any       problems.     Rollene Rotunda, MD, [facility]  Electronically Signed    JH/MedQ  DD: 04/30/2008  DT: 05/01/2008  Job #: [id number]   cc:   [name] Kelp, PA

## 2010-06-08 NOTE — Assessment & Plan Note (Signed)
[facility]                            CARDIOLOGY OFFICE NOTE   NAME:Wechter, EMRYS MCKAMIE                     MRN:          [id number]  DATE:08/22/2007                            DOB:          1945/11/30    PRIMARY CARE PHYSICIAN:  Delaney Meigs, M.D.   REASON FOR PRESENTATION:  Evaluate the patient with aortic stenosis and  recent treatment for dyspnea.   HISTORY OF PRESENT ILLNESS:  The patient is 65 years old.  At last visit  he had increased dyspnea.  I increased his Lasix.  He also since that  visit was found to have some hypothyroidism and has been started on  thyroid replacement.  He says that since all of this he has felt better.  He has dropped about 8 pounds.  He has less swelling in his legs.  He is  having less dyspnea with exertion.  He is not having any PND or  orthopnea.  He is not having any chest discomfort, neck or arm  discomfort.  He has had no palpitation, presyncope, or syncope.   PAST MEDICAL HISTORY:  Aortic stenosis, status post aortic valve  replacement (August 19, 2004, with a #20 [place] Stentless Porcine valve),  hypertension, hyperlipidemia, non-insulin-dependent diabetes mellitus,  and morbid obesity.   ALLERGIES:  PHENERGAN.   MEDICATIONS:  Metoprolol 12.5 mg b.i.d., lisinopril 40 mg daily, Januvia  100 mg daily, metformin 2000 mg daily, Zocor 4 mg daily, acarbose 50 mg  daily, Actos 45 mg daily, Lasix 40 mg daily, levothyroxine 0.5 mg daily,  glyburide 10 mg daily, potassium 20 mEq b.i.d., and citalopram 20 mg  daily.   REVIEW OF SYSTEMS:  As stated in the HPI and otherwise negative for  other systems.   PHYSICAL EXAMINATION:  GENERAL:  The patient is in no distress.  VITAL SIGNS:  Blood pressure 102/78, heart rate 75 and regular, and  weight 325 pounds (down 8 pounds).  NECK:  No jugular venous distention at 45 degrees.  Carotid upstroke  brisk and symmetric, no bruits, no thyromegaly, transmitted systolic  murmur.  LYMPHATICS:  No adenopathy.  LUNGS:  Clear to auscultation bilaterally.  BACK:  No costovertebral angle tenderness.  CHEST:  A well-healed sternotomy scar.  HEART:  PMI not displaced or sustained, S1 and S2 within normal limits,  no S3, no S4, 3/6 apical systolic murmur, early peaking and radiating at  the aortic outflow tract, no diastolic murmurs.  ABDOMEN:  Morbidly obese, positive bowel sounds, normal in frequency and  pitch, no bruits, no rebound, no guarding.  No midline pulsatile mass.  No organomegaly.  SKIN:  No rashes, no nodules.  EXTREMITIES:  2+ pulses, trace bilateral lower extremity edema.  NEUROLOGIC:  Grossly intact.   ASSESSMENT/PLAN:  1. Dyspnea.  This is improved.  I think he has got some diastolic      heart failure.  He understands salt and fluid restriction.  He is      better with the increased dose of diuretic and will continue this.  2. Aortic stenosis.  Valve seems to be functioning normally by  history.  The murmur is unchanged.  We will follow this clinically.  3. Morbid obesity.  He has lost 8 pounds, which may be fluid and may      be related to the hypothyroidism.  I encouraged much more of this.  4. Follow up.  We will see him back in about 6 months or sooner if      needed.     Rollene Rotunda, MD, [facility]  Electronically Signed    JH/MedQ  DD: 08/22/2007  DT: 08/23/2007  Job #: [id number]   cc:   Delaney Meigs, M.D.

## 2010-06-09 ENCOUNTER — Ambulatory Visit (INDEPENDENT_AMBULATORY_CARE_PROVIDER_SITE_OTHER): Payer: Medicare Other | Admitting: Cardiology

## 2010-06-09 DIAGNOSIS — R0989 Other specified symptoms and signs involving the circulatory and respiratory systems: Secondary | ICD-10-CM

## 2010-06-11 NOTE — H&P (Signed)
NAMEHAZAIAH, EDGECOMBE              ACCOUNT NO.:  [id number]   MEDICAL RECORD NO.:  [id number]          PATIENT TYPE:  INP   LOCATION:                               FACILITY:  [facility]   PHYSICIAN:  Salvatore Decent. Cornelius Moras, M.D. DATE OF BIRTH:  1946-01-10   DATE OF ADMISSION:  05/27/2004  DATE OF DISCHARGE:                                HISTORY & PHYSICAL   CHIEF COMPLAINT:  Exertional shortness of breath   HISTORY OF PRESENT ILLNESS:  Mr. 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 and referred for possible elective aortic valve  replacement. Mr. Trompeter reports that he was first told he had an abnormal  heart valve approximately 10 years ago. Over the last few years, he has been  followed by [name]. In 2002, he had an echocardiogram demonstrating  mild to moderate aortic stenosis with peak and mean transvalvular gradients  of 44 and 20 mmHg, respectively. His estimated aortic valve area was  approximately 1.5cm2 at that time, and there was normal left ventricular  size and normal left ventricular systolic function. He was followed  medically.   Mr. Rokosz returned to see [name] for further followup on March29 of  this year.  At that time he reported mild exertional shortness of breath. He  denied any other potentially associated symptoms associated with aortic  stenosis. He underwent a followup echocardiogram at that time. The reports  of this followup echocardiogram are not currently available, but apparently  they demonstrate significant progression of the patient's underlying aortic  stenosis. Subsequently, Mr. Robbins underwent elective left and right heart  catheterization on April21 by [name]. These confirmed the presence of  severe to critical aortic stenosis with an estimated aortic valve  area of  0.54 centimeters squared and peak and mean transvalvular gradients increased  to 84 and 64 mmHg, respectively. Coronary arteriography reveals no  significant coronary artery disease, and left ventricular function appears  preserved. Mr. Mayhall has been referred for elective surgical intervention.   REVIEW OF SYSTEMS:  GENERAL: The patient reports good appetite. His weight  has been stable at 289 pounds. He is 6 feet 1 inch tall. CARDIAC: The  patient specifically denies any history of substernal chest pain, chest  tightness, chest pressure or chest heaviness either with activity or at  rest. He denies any palpitations. Approximately 1 year ago he had a period  of time when he suffered from mild episodes of transient dizziness, but this  seemed to resolve, and he has not had any dizzy spells recently. He denies  any history of syncopal episodes. He reports mild exertional shortness of  breath. This does not seem to limit him much, although he lives a sedentary  lifestyle. He denies resting shortness of breath, PND, orthopnea, or lower  extremity edema. RESPIRATORY: Notable for intermittent dry cough. The  patient denies productive cough, hemoptysis, wheezing. GASTROINTESTINAL:  Negative. The patient denies difficulty swallowing. He reports  normal bowel  function. He denies history of recent hematochezia, hematemesis, or melena.  GENITOURINARY: Negative. The patient denies recent urinary urgency or  frequency. PERIPHERAL VASCULAR: Negative. The patient denies symptoms  suggestive of claudication. He denies history of lower extremity phlebitis  or deep vein thrombosis. NEUROLOGIC: Notable for a transient episode of  numbness involving his right arm and right lower leg approximately 1 year  ago. He states these symptoms resolved within less than 30 minutes, and he  never sought medical attention at that time. He is never had any other  similar symptoms since. The patient denies  history of seizures.  MUSCULOSKELETAL: Notable for mild joint pain in both knees which is chronic  and progressive. PSYCHIATRIC: Negative. HEENT: Negative. The patient does  report having somewhat poor dentition. He has not seen his dentist and more  than a year. He has been educated previously about dental prophylaxis  regarding heart valves. He gets antibiotics when he has dental work done.  However, he does have a single painful molar on the right upper jaw that is  bothering him presently.   PAST MEDICAL HISTORY:  1.  Aortic stenosis.  2.  Hypertension.  3.  Hyperlipidemia.  4.  Type 2 diabetes mellitus  5.  Obesity   PAST SURGICAL HISTORY:  1.  Appendectomy in the remote past.  2.  Resection of heel spur   FAMILY HISTORY:  Noncontributory and notable for the absence of family  members with valvular heart disease. There is no history of rheumatic heart  disease in his family.   SOCIAL HISTORY:  The patient is single and lives alone in [place]. He  works as a Engineer, maintenance. He has a previous history of tobacco use,  although he quit smoking 20 years ago. He denies history of significant  alcohol consumption.   CURRENT MEDICATIONS:  1.  Verapamil 240 mg daily.  2.  Lisinopril 10 mg daily.  3.  Acarbose 50 mg daily.  4.  Glyburide 5 mg daily  5.  Metformin 1000 mg twice daily  6.  Avandia 4 mg daily.  7.  Zocor 40 mg daily   ALLERGIES:  PHENOTHIAZINE.   PHYSICAL EXAMINATION:  GENERAL:  The patient is a well-appearing, moderately  obese white male who appears his stated age in no acute distress.  VITAL SIGNS: Blood pressure is 148/80, pulse 90 and regular. He is afebrile.  Oxygen saturation 97% on room air.  HEENT: Exam is notable for several missing teeth and relatively poor  dentition, but there are no obvious dental abscesses or other problems.  NECK:  The neck is supple. There is no palpable cervical or supraclavicular lymphadenopathy. There is no  jugular venous distension. No carotid bruits  are noted.  CHEST:  Auscultation of the chest reveals clear and symmetrical breath  sounds bilaterally. No wheezes or rhonchi demonstrated.  CARDIOVASCULAR: Exam is notable for regular rate and rhythm. There is a  grade 3-4/6 systolic murmur heard all along the sternal border with  radiation to the neck and across the precordium. No diastolic murmurs noted.  ABDOMEN: The abdomen is obese but soft, nontender. There are no palpable  masses. Bowel sounds are present. The liver edge is not palpable.  EXTREMITIES: Warm and well-perfused. There is no lower extremity edema.  Distal pulses are slightly diminished but palpable in both lower legs in the  dorsalis pedis position. There is no sign of significant venous  insufficiency.  RECTAL AND GU: Exams  are both deferred.  NEUROLOGIC:  Examination is grossly nonfocal and symmetrical throughout.   DIAGNOSTIC TESTS:  Cardiac catheterization performed Donnie Aho is reviewed.  This demonstrates severe aortic stenosis. There is right dominant coronary  circulation with insignificant coronary artery atherosclerosis. Left  ventricular systolic function appears normal. Pulmonary artery pressures  were mildly elevated and measured 40/18 with a pulmonary capillary wedge  pressure of 19. The peak transvalvular gradient across the aortic valve was  84 mmHg, and the mean transvalvular gradient is 64 mmHg. Resting cardiac  output is 4.9 liters per minute corresponding to a baseline cardiac index of  2.0. The patient's estimated aortic valve area was 0.54 centimeters squared.   IMPRESSION:  Severe aortic stenosis with mild symptoms (Class II) of  exertional shortness of breath and preserved left ventricular systolic  function. I believe that Mr. Kruckenberg would best be treated by elective  aortic valve replacement.   PLAN:  I have outlined options at length with Mr. Pagliarulo in the office  today. Alternative treatment  strategies have been discussed, in particular  and including continued close observation versus proceeding with surgery at  this juncture. All their questions have been addressed. We have also  discussed at length the relative risks and benefits of use of a mechanical  prosthesis to replace aortic valve with need for lifelong anticoagulation  with Coumadin. On the other hand, we have discussed the alternatives of the  use of a bioprosthetic tissue valve in effort to avoid anticoagulation with  Coumadin. Given his relatively young age and the absence of coronary artery  disease, it seems reasonably appropriate to use a mechanical prosthesis in  effort to decrease the likelihood of need for subsequent surgical  intervention in the future.. Mr. Kittleson seems like he would probably do  fine with long-term anticoagulation with Coumadin. He specifically  understands and accepts all associated risks of surgery including but not limited to risk of death, stroke, myocardial infarction, congestive heart  failure, respiratory failure, pneumonia, bleeding requiring blood  transfusion, arrhythmia, heart block with bradycardia requiring permanent  pacemaker, late complications related to mechanical prosthetic valve or late  complications related to anticoagulation with Coumadin. All their questions  have been addressed. We will have him seen by Dr. Dian Queen for a formal  dental consultation later this week. We will tentatively plan to proceed  with surgery on Thursday, May4, presuming that he has been cleared by Dr.  Kristin Bruins for surgery. All their questions have been addressed.      CHO/MEDQ  D:  05/17/2004  T:  05/17/2004  Job:  [id number]   cc:   Rollene Rotunda, M.D.   Charlynne Pander, D.D.S.  [facility]  [name]  [place]  [place] [address]  Fax: [phone]   Ernestina Penna, M.D.  [address] [place]  [place] [address]  Fax: [phone]

## 2010-06-11 NOTE — Assessment & Plan Note (Signed)
[facility]                            CARDIOLOGY OFFICE NOTE   NAME:[name], [name]                     MRN:          [id number]  DATE:01/11/2006                            DOB:          [birth year]    PRIMARY CARE PHYSICIAN:  Montey Hora.   REASON FOR VISIT:  Evaluate the patient's aortic valve replacement.   HISTORY OF PRESENT ILLNESS:  The patient is now 65 years old.  He  presents for followup of aortic valve replacement.  He says he has done  relatively well.  He does report that he gets short of breath if he does  not take his diuretic.  He gets dyspneic with exertion.  If he takes his  diuretic, however, he has no acute complaints.  He does not describe  chest pressure, neck discomfort, or arm discomfort.  He has had no PND  or orthopnea.  He has had no palpitations, presyncope or syncope.  He  has had no fevers or chills.   He has gained 75 pounds since 2000!  He blames some of this on fluid.  He is somewhat limited as he has left heel spurs.   PAST MEDICAL HISTORY:  Aortic stenosis status post aortic valve  replacement on August 19, 2004 with a No. 21 [place] stentless porcine  valve, hypertension, hyperlipidemia, non-insulin-dependent diabetes  mellitus, obesity.   ALLERGIES:  PHENERGAN.   MEDICATIONS:  1. Avandia 4 mg b.i.d.  2. Glucophage 1000 mg b.i.d.  3. Zocor 40 mg daily.  4. Acarbose 50 mg daily.  5. Metoprolol 12.5 mg b.i.d.  6. Aspirin.  7. Lisinopril 40 mg a day.  8. Glyburide 5 mg daily.  9. Januvia.  10.Furosemide 20 mg daily.  11.Klor-Con 20 mEq daily.   REVIEW OF SYSTEMS:  As stated in the HPI and otherwise negative for  other systems.   PHYSICAL EXAMINATION:  The patient is in no distress.  Blood pressure  156/78, heart rate 66 and regular, weight 315 pounds, body mass index  42!  HEENT:  Eyes unremarkable, pupils equal, round and reactive to light,  fundi not visualized, oral mucosa unremarkable.  NECK:   No jugular venous distention at 45%, carotid upstroke brisk and  symmetric.  Transmitted systolic murmur versus bruits, no thyromegaly.  LYMPHATICS:  No cervical, axillary, inguinal adenopathy.  LUNGS:  Clear to auscultation bilaterally.  BACK:  No costovertebral angle tenderness.  CHEST:  Well healed sternotomy scar.  HEART:  PMI nondisplaced or sustained, S1 and S2 within normal limits,  no S3, 3/6 apical systolic murmur radiating at the aortic outflow track,  early peaking, no diastolic murmurs.  ABDOMEN:  Morbidly obese, positive bowel sounds normal in frequency and  pitch, no bruits, rebound, guarding, no midline pulsatile mass,  hepatomegaly, splenomegaly.  SKIN:  No rashes, no nodules.  EXTREMITIES:  2+ pulses throughout, no edema, no cyanosis or clubbing.  NEURO:  Oriented to person, place, time, cranial nerves II-XII grossly  intact, motor grossly intact throughout.   EKG:  Sinus rhythm, rate 66, axis within normal limits, intervals within  normal limits, poor anterior  R-wave progression, no acute ST-T-wave  changes.   ASSESSMENT AND PLAN:  1. Aortic stenosis.  The patient is having no symptoms related to this      except for perhaps some mild diastolic dysfunction treated easily      with diuretic.  We discussed fluid and salt restriction.  2. Bruit.  The patient has bruit versus transmitted murmur.  It is      louder than I recall.  I went extensively through the chart and did      not find previous Dopplers and he does not recall these.      Therefore, we will go ahead and schedule carotid Doppler to exclude      obstructive atherosclerosis.  3. Obesity.  We had a long discussion (we had a half hour this      appointment) in the office.  He continues to have an increase in      his weight.  I re-educated him and he admits that this is not fluid      predominantly.  He may be retaining some fluid secondary to dietary      indiscretion and his Avandia.  It is predominantly  fat from his      sedentary lifestyle and eating what he wants.  He understands that      he is headed for significant morbidity related to this and      complications with his diabetes and hypertension.  Hopefully, he      can comply with calorie counting and trying to start to lose this      weight and reverse this dreadful trend.  4. Hypertension.  Blood pressure is elevated.  However, he just had      his lisinopril increased and I will defer to his primary care      giver.  5. Followup.  I will see the patient back in one year or sooner if he      has any problems.     Rollene Rotunda, MD, [facility]  Electronically Signed    JH/MedQ  DD: 01/11/2006  DT: 01/11/2006  Job #: [id number]   cc:   [facility]

## 2010-06-11 NOTE — H&P (Signed)
[name], [name]              ACCOUNT NO.:  [id number]   MEDICAL RECORD NO.:  [id number]          PATIENT TYPE:  INP   LOCATION:  NA                           FACILITY:  [facility]   PHYSICIAN:  Salvatore Decent. Cornelius Moras, M.D. DATE OF BIRTH:  1945/06/15   DATE OF ADMISSION:  08/11/2004  DATE OF DISCHARGE:                                HISTORY & PHYSICAL   CARDIOLOGIST:  Rollene Rotunda, M.D.   CHIEF COMPLAINT:  Exertional shortness of breath.   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 and was referred to [name] for possible elective  aortic valve replacement. He reports he was told he had an abnormal heart  valve approximately 10 years ago. Over the last years, he has been followed  by [name]. In 2002 he had echocardiogram demonstrating mild to  moderate aortic stenosis with peak and mean transvalvular gradients of 44  and 20 mmHg, respectively. At that time there was a normal left ventricular  size and normal left ventricular systolic function. He has been followed  medically. He returned to see [name] for followup on April 21, 2004.  At that time he reported mild exertional shortness of breath but denied any  other potentially associated symptoms associated with aortic stenosis. He  underwent a followup echocardiogram at that time which demonstrated  significant progression of the patient's underlying aortic stenosis. He  subsequently underwent elective left and right heart catheterization on  April 21 which confirmed the presence of severe to critical aortic stenosis  with an estimated aortic valve area of 0.54 cm2 and peak and mean  transvalvular gradients increased to 84 and 60 mmHg, respectively. Coronary  arteriography revealed no significant coronary artery  disease, and left  ventricular function appeared preserved. After examining the patient and  review of his previous workup, [name] did feel that Mr .[name] would  benefit from aortic valve replacement. Surgery was initially scheduled for  May 2006. In the meantime, he did get a dental consult by Dr. Dian Queen  and had several dental extractions. However, [name] ran into problems  with health insurance coverage. Apparently his [facility]  coverage was limited until August 11, 2004. Since his surgery with elective,  [name] was reluctant to proceed without medical coverage. [name] met  again with [name] and discussed risks, benefits of postponing surgery.  Ultimately his surgery was rescheduled for August 11, 2004. Today, August 09, 2004, he is at the [facility] office for preoperative History and Physical as well  as an office visit with [name] to further discuss surgery and the type of  aortic valve to be utilized. [name]'s symptoms remain primarily dyspnea  on exertion. He denies chest pain. He gets occasional shortness of breath.  He sleeps on one to two pillows but denies orthopnea. He denies cough. He  gets mild peripheral edema. He has  occasional  polyuria and does have  nocturia with voiding approximately four to five times a night. He denies  diaphoresis, TIA or stroke symptoms, reflux symptoms, palpitations, or  fatigue.   PAST MEDICAL HISTORY:  1.  Aortic stenosis.  2.  Hypertension.  3.  Hyperlipidemia.  4.  Type 2 diabetes mellitus.  5.  Obesity.  6.  Familial tremor.   PAST SURGICAL HISTORY:  1.  Appendectomy.  2.  Resection of a heel spur believed to be on the right.  3.  Dental extractions May 2006.   ALLERGIES:  He is allergic to PHENOTHIAZINE.   MEDICATIONS:  1.  Verapamil 240 milligrams p.o. daily.  2.  Lisinopril 10 milligrams p.o. daily.  3.  Acarbose 50 milligrams p.o. daily.  4.  Glyburide 5 milligrams p.o. daily.  5.   Metformin 1000 milligrams p.o. b.i.d.  6.  Avandia 4 milligrams p.o. daily.  7.  Zocor 40 milligrams p.o. daily.   REVIEW OF SYSTEMS:  Please refer to history of present illness for per  positives and negatives.  In addition, he denies hematochezia, hematuria,  fever, dysphagia, or joint pains. He does report approximately 10-pound  weight loss over the past 2-3 months secondary to stress.   SOCIAL HISTORY:  He is single with one child. He lives alone. He quit  smoking approximately 28 years ago. He denies any significant alcohol use.  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   FAMILY HISTORY:  His mother died at age 56 from hyponatremia.  His father  died at age 67 from what sounds like congestive heart failure.   PHYSICAL EXAMINATION:  VITAL SIGNS:  Blood pressure 120/80, heart rate 80,  respirations 20  GENERAL APPEARANCE:  This is a 65 year old Caucasian male who is alert,  cooperative and in no acute distress. He has an obese body habitus  HEENT:  His head is normocephalic and atraumatic. His pupils were equal,  round, reactive to light and accommodation. His oral mucosa is pink and  moist. He is status post upper dental extractions and has several missing  teeth on his lower jaw. Sclerae are nonicteric.  NECK:  His neck is supple. No JVD or lymphadenopathy is noted. He has  palpable carotid pulses and no bruits auscultated.  RESPIRATORY:  Lung sounds were clear, unlabored, and symmetrical on  inspiration  CARDIAC:  His heart has a regular rate and rhythm. He has a 3/6 holosystolic  murmur along the bilateral sternal borders. No rub or gallop was noted.  ABDOMEN:  His abdomen is soft, nontender, nondistended. It is obese with no  hepatomegaly noted. He has normoactive bowel sounds. He does have a right  lower quadrant scar just lateral to his umbilicus  GU/RECTAL:  These exams were deferred  EXTREMITIES:  His extremities showed trace edema without  ulceration. Extremities were warm with 2+ radial pulse bilaterally and 2+ dorsalis pedis  pulses bilaterally and 1+ posterior tibial pulses bilaterally.  NEUROLOGIC: His neurologic exam is grossly intact. He is alert and oriented  x 4. His gait is steady. Speech is clear. His muscle strength is 5/5 in his  upper and lower extremities and is grossly symmetrical.   ASSESSMENT:  Severe to critical aortic stenosis.   PLAN:  He will be admitted to [facility] on  August 11, 2004, to  undergo aortic valve replacement by Dr. Tressie Stalker. [name] is meeting  with Mr. Hamre today to further discuss options regarding a tissue  or  mechanical aortic valve.       AWZ/MEDQ  D:  08/09/2004  T:  08/09/2004  Job:  [id number]   cc:   Rollene Rotunda, M.D.  [address]  [address]  [place]  [place] [address]   Ernestina Penna, M.D.  [address] [place]  [place] [address]  Fax: [phone]

## 2010-06-11 NOTE — Op Note (Signed)
[name], [name]              ACCOUNT NO.:  [id number]   MEDICAL RECORD NO.:  [id number]          PATIENT TYPE:  AMB   LOCATION:  DAY                          FACILITY:  [facility]   PHYSICIAN:  Charlynne Pander, D.D.S.DATE OF BIRTH:  1945-07-31   DATE OF PROCEDURE:  05/20/2004  DATE OF DISCHARGE:                                 OPERATIVE REPORT   SURGEON:  Charlynne Pander, D.D.S.   PREOPERATIVE DIAGNOSES:  1.  Aortic stenosis.  2.  Preaortic valve replacement dental protocol.  3.  Chronic apical periodontitis.  4.  Chronic periodontitis.  5.  Accretions.  6.  Upper right quadrant buccal exostoses.  7.  Malocclusion.   POSTOPERATIVE DIAGNOSES:  1.  Aortic stenosis.  2.  Preaortic valve replacement dental protocol.  3.  Chronic apical periodontitis.  4.  Chronic periodontitis.  5.  Accretions.  6.  Upper right quadrant buccal exostoses.  7.  Malocclusion.   OPERATION:  1.  Dental examination.  2.  Extraction of tooth numbers 2, 4, 6, 13, 28 and 32.  3.  Three quadrants of alveoloplasty.  4.  Upper right quadrant buccal exostoses reduction.  5.  Adult prophylaxis of remaining dentition.   SURGEON:  Charlynne Pander, D.D.S.   ASSISTANT:  Elliot Dally (Sales executive).   ANESTHESIA:  Monitored anesthesia care per the anesthesia team.   MEDICATIONS:  1.  Ampicillin 2.0 Grams IV prior to invasive dental procedures.  2.  Gentamycin 80 mg IV prior to invasive dental procedures.  3.  Local anesthesia with total utilization of 6 carpules each containing 36      mg of Xylocaine with 0.018 mg of epinephrine as well as 2 carpules      containing 54 mg of Mepivicaine with no epinephrine.   SPECIMENS:  There were six teeth which were discarded.   CULTURES:  None.   DRAINS:  None.   COMPLICATIONS:  None.   ESTIMATED BLOOD LOSS:  100 mL.   FLUIDS:  200 mL of lactated Ringer's solution.   INDICATIONS FOR PROCEDURE:  The patient was recently diagnosed with severe  aortic stenosis. Patient with anticipated aortic valve replacement heart  surgery with [name]. A dental consultation was requested to rule out  dental infection which may affect the patient's systemic health and  anticipated heart valve surgery. The patient was evaluated and treatment  planned for multiple extractions with alveoloplasty, preprosthetic surgery  as indicated, and dental cleaning of the remaining dentition. This treatment  plan was formulated to decrease the risks and complications associated with  dental infection from affecting the patient's systemic health and  anticipated heart valve surgery.   FINDINGS:  The patient was examined in operating room #6. The teeth were  identified for extraction. The patient was noted to be affected by chronic  apical periodontitis, chronic periodontitis, accretions, upper right  quadrant buccal exostoses and malocclusion. The aforementioned necessitated  the removal of multiple teeth with alveoloplasty along with preprosthetic  surgery and dental cleaning of the remaining teeth.   DESCRIPTION OF PROCEDURE:  The patient was brought the main operating  room  #6. The patient was then placed in supine position on the operating room  table. Monitored anesthesia care was induced per the anesthesia team. The  patient was then prepped and draped in the usual manner for a dental  medicine procedure. The oral cavity was thoroughly examined with findings as  noted above. The patient was then ready for the dental medicine procedure as  follows:   Local anesthesia was administered sequentially over the 90 minute procedure  with a total utilization of six carpules each containing 36 mg of Xylocaine  with 0.018 mg of epinephrine as well as 2 carpules containing 54 mg of  Mepivicaine with no epinephrine.   The maxillary quadrants were first approached, anesthesia was delivered as  previously described. A 15 blade incision was made from the maxillary  right  tuberosity through the mesial of #8. The surgical flap was then carefully  reflected. Tooth #2, 4 and 6 were then removed with the 150 forceps without  complications. Alveoloplasty was then performed utilizing rongeurs and bone  file. The upper right quadrant buccal exostoses were then visualized and  removed with rongeurs and bone file appropriately. The surgical site was  then irrigated with copious amounts of sterile saline. The tissues were  approximated and trimmed appropriately. The surgical site was then closed  utilizing 3-0 chromic gut suture material in a continuous interrupted suture  technique from the maxillary right tuberosity through the mesial of tooth  #8. Two additional interrupted sutures were placed to further close the  surgical site.   The maxillary left quadrant was then approached, a 15 blade incision was  made from the distal of #14 through the mesial of #11. The surgical flap was  then carefully reflected, tooth #13 was subluxated and then removed with a  150 forceps without complications. Alveoloplasty was then performed  utilizing rongeurs and bone file. The surgical site was then irrigated with  copious amounts of sterile saline. The tissues were approximated and trimmed  appropriately. The surgical site was then closed from the distal of #14  through the mesial of #11 utilizing 3-0 chromic gut suture in a continuous  interrupted suture technique x1.   The mandibular right quadrant was then approached, anesthesia was delivered  as previously described. A 15 bladder incision was made from the distal of  #32 through the mesial of #27. A surgical flap was then carefully reflected.  Buccal and interseptal bone was removed around tooth #32. A surgical  handpiece and bur and copious amounts of sterile saline was utilized. Tooth #32 was then subluxated and then removed with the 23 forceps without  complications. Tooth #28 was then subluxated carefully and  removed with the  151 forceps without complications. Alveoloplasty was then performed  utilizing rongeurs and  bone file. The surgical site was then irrigated with  copious amounts of sterile saline. The tissues were approximated and trimmed  appropriately. The surgical site was then closed from the distal of #32  through the distal of #27 utilizing 3-0 chromic gut suture in a continuous  interrupted suture technique x1. A proximal interrupted suture was then  placed between tooth numbers 26 and 27 appropriately.   At this point in time, the remaining teeth were approached. The KaVo sonic  scaler was then utilized to remove the accretions around tooth #22 through  27.  A series of curettes were then utilized to remove further accretions.  The KaVo sonic scaler was then again utilized to remove further  accretions.  At this point in time, the entire mouth was irrigated with copious amounts  of sterile saline. The patient was examined for complications, seeing none,  the dental medicine procedure was deemed to be complete. A series of 4 x 4  gauzes were placed in the mouth to aid hemostasis. The patient was then  handed over to the anesthesia team for final disposition. After an  appropriate amount of time, the patient was taken to the post anesthesia  care unit with stable vital signs and good oxygenation level. All counts are  correct for the dental medicine procedure. The patient will be given  appropriate pain medication and will followup with dental medicine in  approximately one week. The patient is to contact dental medicine if  problems arise. Barring any complications, the patient is cleared for heart  valve surgery on next Thursday but I will confirm this early next week.      RFK/MEDQ  D:  05/20/2004  T:  05/20/2004  Job:  [id number]   cc:   Salvatore Decent. Cornelius Moras, M.D.  [address]  [place]  [place] [address]

## 2010-06-11 NOTE — Cardiovascular Report (Signed)
[name], [name]              ACCOUNT NO.:  [id number]   MEDICAL RECORD NO.:  [id number]          PATIENT TYPE:  OIB   LOCATION:  6501                         FACILITY:  [facility]   PHYSICIAN:  Rollene Rotunda, M.D.   DATE OF BIRTH:  [birth year]   DATE OF PROCEDURE:  05/14/2004  DATE OF DISCHARGE:                              CARDIAC CATHETERIZATION   PRIMARY CARE PHYSICIAN:  Ernestina Penna, M.D.   PROCEDURE:  Right and left heart catheterization, coronary arteriography.   INDICATION:  Evaluate patient with aortic stenosis.   PROCEDURE NOTE:  Left heart catheterization was performed via the right  femoral artery.  Right heart catheterization was performed via the right  femoral vein.  Both vessels were cannulated using an anterior wall puncture.  A 4 French arterial sheath and a 7 French venous sheath were inserted via  the modified Seldinger technique.  Preformed Judkins and a pigtail catheter  were utilized.  Swan-Ganz catheter was utilized.  Straight wire and a long  exchange wire were required to cross the tight aortic valve.  The patient  tolerated the procedure well and left the lab in stable condition.   RESULTS:   HEMODYNAMICS:  1.  RA mean 10, RV 59/9, PA 40/18 with mean of 28, pulmonary capillary wedge      mean 19, AO 135/79, LV 222/29.  Cardiac output/cardiac index (Fick)      4.9/2.0, aortic valve area/index 0.54/0.22 gradient (peak-to-peak/mean)      84/64.   CORONARIES:  The left main was normal.  The LAD was not a particularly large  vessel.  There were proximal luminal irregularities.  There was a large  first diagonal which was branching and normal.  The circumflex in the AV  groove was normal.  OM-1 through OM-3 were relatively small vessels and  normal.  The right coronary artery is very large and dominant.  There was a  large PDA and a very large posterior lateral which were normal.   LEFT VENTRICULOGRAM:  Left ventriculogram was obtained in the RAO  projection  with an EF of 65%.   CONCLUSION:  Severe to critical aortic stenosis.   PLAN:  The patient will need aortic valve replacement.      JH/MEDQ  D:  05/14/2004  T:  05/14/2004  Job:  [id number]   cc:   Ernestina Penna, M.D.  [address] [place]  [place] [address]  Fax: [phone]

## 2010-06-11 NOTE — Consult Note (Signed)
[name]                          ENDOCRINOLOGY CONSULTATION   NAME:[name], [name]                     MRN:          [id number]  DATE:03/09/2006                            DOB:          12-30-1945    REFERRING PHYSICIAN:  Alfredia Client, MD   REASON FOR REFERRAL:  Hyperthyroidism.   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  I treated him with Iodine-  131 therapy in 2000.  Despite this, he continued to require PTU to  control his hyperthyroidism, although it was improved.  However, he  states he has not required that medication in the past few years.  Symptomatically, he has gained 50 pounds in the past six months and he  has associated tremor of his hands and slight depression.   PAST MEDICAL HISTORY:  1. Aortic stenosis.  2. Type 2 diabetes.  3. Gout.   MEDICATIONS:  Lopressor, Zestril, glyburide, Avandia, Januvia,  metformin, Zocor and Acarbose.   SOCIAL HISTORY:  He is retired.  He is single.   FAMILY HISTORY:  Negative for thyroid disease.   REVIEW OF SYSTEMS:  Denies fever and denies palpitations.   PHYSICAL EXAMINATION:  Blood pressure 133/78, heart rate 72, temperature  97.6.  The weight is 317.  GENERAL:  Obese.  No distress.  SKIN:  Normal texture and temperature.  No rash.  Not diaphoretic.  HEENT:  No proptosis.  No periorbital swelling.  Pharynx is normal.  NECK:  Slightly enlarged thyroid with an irregular surface.  CHEST:  Clear to auscultation.  No respiratory distress.  CARDIOVASCULAR:  No JVD. No edema.  Regular rate and rhythm with a soft  systolic murmur.  NEUROLOGICAL:  Alert, well oriented.  Does not appear anxious nor  depressed and a postural tremor is present.   Laboratory studies forwarded by Dr. Morrie Sheldon on February 06, 2006:  He has a  thyroid ultrasound showing a small multinodular goiter.   On January 04, 2006, TSH normal at 2.18.   Other thyroid function  studies are normal.   IMPRESSION:  1. Multinodular goiter which is usually hereditary.  2. History of hyperthyroidism due to the multinodular goiter.  3. This appears to be resolved, at least for now, because he has not      recently required PTU.  4. Symptom complex of weight gain, tremor and depression of      nonthyroidal etiology.   PLAN:  1. We discussed the natural history of a multinodular goiter and I      have told him that there is a good chance his hyperthyroidism will      recur.  2. Return here six months or sooner if necessary.  3. Follow up your symptoms with Dr. Morrie Sheldon.  4. The patient asked me would I start him on an antidepressant.  I      have told him that I would do so if he would have a follow-up      appointment soon (within the next few weeks with Dr. Morrie Sheldon).  He  agrees to this, so I have given him a prescription for Celexa 20 mg      a day and I have told him that he needs to follow this up with Dr.      Morrie Sheldon to see if Dr. Morrie Sheldon agrees this is an appropriate therapy for      him.     Sean A. Everardo All, MD  Electronically Signed    SAE/MedQ  DD: 03/12/2006  DT: 03/12/2006  Job #: [id number]   cc:   Alfredia Client, MD

## 2010-06-11 NOTE — Discharge Summary (Signed)
[name], [name]              ACCOUNT NO.:  [id number]   MEDICAL RECORD NO.:  [id number]          PATIENT TYPE:  INP   LOCATION:  2028                         FACILITY:  [facility]   PHYSICIAN:  Salvatore Decent. Cornelius Moras, M.D. DATE OF BIRTH:  Oct 27, 1945   DATE OF ADMISSION:  08/19/2004  DATE OF DISCHARGE:  08/25/2004                                 DISCHARGE SUMMARY   PRIMARY ADMITTING DIAGNOSIS:  Shortness of breath.   ADDITIONAL/DISCHARGE DIAGNOSES:  1.  Severe aortic stenosis.  2.  Hypertension.  3.  Hyperlipidemia.  4.  Type 2 diabetes mellitus.  5.  Obesity.  6.  History of familial tremor.  7.  Status post treatment for an E. coli urinary tract infection, now      resolved.   PROCEDURES PERFORMED:  Aortic valve replacement with 21-mm [place] stentless  porcine valve.   HISTORY:  The patient is a 65 year old white male with a known history of  aortic stenosis, who has been followed by [name].  He has recently  experienced worsening exertional shortness of breath.  He underwent a  followup echocardiogram which showed significant progression of his  underlying aortic stenosis.  He subsequently underwent elective left and  right heart catheterization on May 14, 2004 which confirmed the presence  of severe critical aortic stenosis with an estimated aortic valve area of  0.54 sq cm and peak and mean transvalvular gradients increased to 84 and 60  mmHg, respectively.  Coronary angiography revealed no significant coronary  artery disease and his left ventricular function was well-preserved.  He was  referred to Dr. Tressie Stalker for consideration of aortic valve replacement.  [name] saw the patient and reviewed his films, and agreed that his best  course of action would be to proceed with surgery at this time, in light of  his escalating symptoms.  He underwent a complete dental workup including  dental extractions prior to surgery.  He was originally scheduled for  surgery  earlier in the summer, however, because of insurance benefit  changes, his surgery was postponed until August 19, 2004.  He remained stable  throughout that time, although he continued to have symptoms.   HOSPITAL COURSE:  The patient was admitted to [facility] on August 19, 2004.  He was taken to the operating room where he underwent an aortic  valve replacement as described in detail above, performed by [name].  He  tolerated the procedure well and was transferred to the SICU in stable  condition.  He was able to be extubated shortly after surgery.  He was  hemodynamically stable and doing well on postop day 1.  At that time, his  hemodynamic monitoring devices and his chest tubes were removed, and he was  slowly mobilized.  He was started on a beta blocker and a diuretic for mild  volume overload.  He was also started on his home diabetes medications.  Later in the day on postop day 1, he was able to be transferred to the  floor.  Postoperatively, he has made slow, but steady improvement.  He  has  progressed with cardiac rehab and at the present, is ambulating up to 400-  500 feet several times a day with assistance and a rolling walker.  He has  remained afebrile and all vital signs have been stable.  He is maintaining  normal sinus rhythm.  He has been diuresing very well and is back down to  within about 5 pounds of his preoperative weight.  His blood sugars have  been stable on his home medications.  His surgical incisions sites are all  healing well.  His most recent labs show a hemoglobin of 9.3, hematocrit  27.6, white count 9.4, platelets 106,000; sodium 133, potassium 4.6, BUN 15,  creatinine 1.1.  He is progressing well and it is felt at this time that he  is stable from a medical standpoint and ready for discharge.  He is to be  discharged to the care of a friend with whom he will stay for the next 1-2  weeks and home health has been arranged.   DISCHARGE  MEDICATIONS:  1.  Enteric-coated aspirin 325 mg daily.  2.  Lopressor 25 mg b.i.d.  3.  Lisinopril 5 mg daily.  4.  Zocor  40 mg daily.  5.  Precose 50 mg daily.  6.  Glyburide 5 mg daily.  7.  Metformin 1000 mg b.i.d.  8.  Avandia 4 mg daily.  9.  Lasix 40 mg daily x5 days.  10. K-Dur 20 mEq daily x5 days.  11. Tylox one to two q.4 h. p.r.n. for pain.   DISCHARGE INSTRUCTIONS:  He is asked to refrain from driving, heavy lifting  or strenuous activity.  He may continue ambulating daily and a rolling  walker has been obtained for home use.  He will continue a low-fat, low-  sodium diet with diabetic restrictions.  He is asked to shower daily and  clean his incisions with soap and water.   DISCHARGE FOLLOWUP:  He will see [name] back in the office on September 07, 2004 at 12:15 p.m.  He will have a chest x-ray at this visit.  He will  then see [name] on September 13, 2004 at 1:15 p.m.  He is asked to bring his  chest x-ray to this appointment for [name] to review.  He will call our  office in the interim if he experiences any problems or has questions.       GC/MEDQ  D:  08/25/2004  T:  08/26/2004  Job:  [id number]   cc:   Ernestina Penna, M.D.  [address] [place]  [place] [address]  Fax: [phone]   Rollene Rotunda, M.D.  [address]  [address]  [place]  [place] [address]

## 2010-06-11 NOTE — Op Note (Signed)
NAMEONIEL, MELESKI              ACCOUNT NO.:  [id number]   MEDICAL RECORD NO.:  [id number]          PATIENT TYPE:  INP   LOCATION:  2312                         FACILITY:  [facility]   PHYSICIAN:  Bedelia Person, M.D.        DATE OF BIRTH:  05-Jun-1945   DATE OF PROCEDURE:  08/19/2004  DATE OF DISCHARGE:                                 OPERATIVE REPORT   Mr. Oaxaca is a 65 year old male with known aortic stenosis.  TEE will be  used intraoperatively to assess the stent to be repaired.  After induction  of general anesthesia, the airway was secured with a oroendotracheal tube.  An oral gastric tube was passed down the oropharynx and the stomach  suctioned without difficulty.  The transesophageal probe was lubricated and  placed in a sleeve.  The sleeve was lubricated, and then the sleeve and  transducer were passed easily down the oropharynx to 40-45 cm position.  There it remained in a neutral and flexed position throughout the case.  Various Omniplane views were obtained during the procredure.  At the  completion of the  procedure, the probe was removed.  There was no evidence  of oral or pharyngeal damage.   Prebypass examination revealed the left ventricle to be moderately  thickened.  There were no segmental defect detected.  The mitral valve had  thin leaflets that opened and closed appropriately.  No calcification was  noted.  Color Doppler revealed trace central mitral regurgitant flow.  The  left atrium was normal size in appearance.  The appendage was clean.  The  aortic valve was heavily calcified.  There appeared to be three leaflets.  Diameter was measured at the annulus at 2.2 cm.  The color Doppler revealed  trace central aortic insufficiency flow jet.  There was marked opening of  any of the leaflets.  Right heart exam was normal.  The patient was placed  on cardiopulmonary bypass and underwent aortic valve replacement with a  stentless valve.  At the completion of the  procedure, there were numerous  air bubbles which were cleared with left ventricular vent.  The left  ventricle post bypass, again, showed no segmental defects.  Contractility  was good.  The mitral valve was unchanged in appearance.  There was slightly  additional filling pressures which resulted in 1+ central regurgitant flow.  The stentless aortic valve appeared to be seated well.  Leaflets were noted  to be opening and closing appropriately.  Color Doppler could detect no  aortic insufficiency flow.  Right heart exam, again, was also normal.       LK/MEDQ  D:  08/19/2004  T:  08/19/2004  Job:  [id number]

## 2010-06-11 NOTE — Discharge Summary (Signed)
[name], [name]              ACCOUNT NO.:  [id number]   MEDICAL RECORD NO.:  [id number]          PATIENT TYPE:  INP   LOCATION:  2028                         FACILITY:  [facility]   PHYSICIAN:  Salvatore Decent. Cornelius Moras, M.D. DATE OF BIRTH:  08/09/1945   DATE OF ADMISSION:  08/19/2004  DATE OF DISCHARGE:  08/25/2004                                 DISCHARGE SUMMARY   PRIMARY ADMITTING DIAGNOSIS:  Shortness of breath.   DISCHARGE DIAGNOSES:  1.  Severe aortic stenosis.  2.  Hypertension.  3.  Hyperlipidemia.  4.  Type 2 non-insulin-dependent diabetes mellitus.  5.  Obesity.  6.  History of familial tremor.   PROCEDURES PERFORMED:  Aortic valve replacement with 21 mm [place] stentless  porcine valve.   HISTORY:  The patient is a 65 year old white male with a history of aortic  stenosis who has been followed by Dr. Rollene Rotunda for several years. He  recently presented with worsening shortness of breath. He underwent an  echocardiogram in March2006 which showed significant progression of his  underlying aortic stenosis. He subsequently underwent elective left and  right heart catheterization on April21, 2006 which confirmed the presence of  severe to critical aortic stenosis.   Dictation ended at this point.       GC/MEDQ  D:  08/25/2004  T:  08/25/2004  Job:  [id number]

## 2010-06-11 NOTE — Op Note (Signed)
[name], [name]              ACCOUNT NO.:  [id number]   MEDICAL RECORD NO.:  [id number]          PATIENT TYPE:  INP   LOCATION:  2312                         FACILITY:  [facility]   PHYSICIAN:  Salvatore Decent. Cornelius Moras, M.D. DATE OF BIRTH:  [birth year]   DATE OF PROCEDURE:  08/19/2004  DATE OF DISCHARGE:                                 OPERATIVE REPORT   PREOPERATIVE DIAGNOSIS:  Severe aortic stenosis.   POSTOPERATIVE DIAGNOSIS:  Severe aortic stenosis.   PROCEDURE:  Median sternotomy for aortic valve replacement (21-mm [place]  stentless porcine valve).   SURGEON:  Salvatore Decent. Cornelius Moras, M.D.   ASSISTANT:  Mr. Gershon Crane, P.A.-C.   ANESTHESIA:  General.   BRIEF CLINICAL NOTE:  The patient is a 65 year old obese white male from  [place], [place] with history of aortic stenosis, hypertension,  hyperlipidemia, and type 2 diabetes mellitus.  The patient has been followed  by [name] for several years and recently developed symptoms of  progressive exertional shortness of breath.  Followup echocardiogram  confirmed the presence of severe aortic stenosis that had progressed  significantly in comparison with previous echocardiograms.  He underwent  left and right heart catheterization, also confirming the presence of aortic  stenosis with associated normal left ventricular function and no significant  coronary artery disease.  The patient was originally seen in April of 2006  and scheduled for elective aortic valve replacement.  However, his health  insurance company denied payment for planned hospitalization and procedure  until the patient had completed the grace period through August 11, 2004.  The  patient now returns for elective surgery.  Of note, prior to his planned  elective operation, the patient also developed symptomatic urinary tract  infection that was treated with oral antibiotics successfully.   OPERATIVE CONSENT:  The patient has been counseled at length regarding  the  indications and potential benefits of the aortic valve replacement.  Alternative treatment strategies have been discussed in detail.  In  particular, the choice of valve prosthesis was reviewed at length and the  relative risks and benefits of use of a mechanical prosthesis with secondary  need for lifelong anticoagulation versus use of a bioprosthetic tissue valve  with secondary potential for late valve degeneration and failure have all  been discussed.  After considerable thought, the patient specifically  desires that we use a bioprosthetic tissue valve such that he will avoid the  need for long-term anticoagulation.  He understands that there is some risk  that the valve may ultimately degenerate and fail during his lifetime.  He  also understands and accepts all associated risks of surgery including, but  not limited to, risk of death, stroke, myocardial infarction, congestive  heart failure, respiratory failure, pneumonia, bleeding requiring blood  transfusion, arrhythmia, infection, and recurrent valvular heart disease.  All of his questions have been addressed.   OPERATIVE NOTE IN DETAIL:  The patient is brought to the operating room on  the above-mentioned date and central monitoring is established by the  anesthesia service under the care and direction of Dr. Bedelia Person.  Specifically, a  Swan-Ganz catheter is placed through the right internal  jugular approach.  A radial arterial line is placed.  Intravenous  antibiotics are administered.  Following induction with general endotracheal  anesthesia, a Foley catheter is placed.  The patient's chest, abdomen, both  groins, and both lower extremities are prepared and draped in a sterile  manner.   Baseline transesophageal echocardiogram is performed by Dr. Gypsy Balsam.  This  confirms the presence of severe aortic stenosis.  The aortic valve appears  to be tricuspid.  It is heavily fibrosed and calcified, and the leaflets do  not  open much at all.  There is only trace aortic insufficiency.  The aortic  annulus is relatively small, measuring less than 22 mm in diameter.  Similarly, the sinotubular junction in the proximal aorta is relatively  small caliber.  There is normal left ventricular systolic function, but  moderate-to-severe left ventricular hypertrophy with likely significant  diastolic dysfunction.  There is trace mitral regurgitation.  No other  abnormalities are noted.   A median sternotomy incision is performed.  The pericardium is opened. The  patient is heparinized systemically.  The ascending aorta is normal in  appearance.  The ascending aorta is cannulated for cardiopulmonary bypass.  A venous cannula is placed through the tip of the right atrial appendage.  A  retrograde cardioplegic catheter is placed through the right atrium into the  coronary sinus.  Adequate heparinization is verified.   Cardiopulmonary bypass is begun.  A left ventricular vent is placed through  the right superior pulmonary vein.  The surface of the left ventricle is  inspected.  There is moderate-to-severe left ventricular hypertrophy.  The  epicardial coronary arteries appear normal.  No other abnormalities are  noted.  A cardioplegic catheter is placed in the ascending aorta.  A  temperature probe is placed in the left ventricular septum.   The patient is cooled to 30 degrees systemic temperature.  The aortic  crossclamp is applied and cardioplegia is delivered initially in an  antegrade fashion through the aortic root.  Supplemental cardioplegia is  administered retrograde through the coronary sinus catheter.  Iced saline  slush is applied for topical hypothermia.  The initial cardioplegic arrest  and myocardial cooling are felt to be excellent.  Repeat doses of  cardioplegia are administered intermittently throughout the crossclamp portion of the operation retrograde through the coronary sinus catheter to  maintain  left ventricular septal temperature of less than 15 degrees  centigrade.   A transverse aortotomy incision is performed.  The aortic valve is exposed.  Aortic valve is tricuspid and severely stenotic.  There is heavy  calcification in the valve leaflets and in the valve annulus.  The left main  coronary artery and the right coronary artery are both in the normal  anatomical location, although the right coronary artery is slightly  eccentric within the right sinus of Valsalva towards the commissure between  the right and noncoronary cusp of the aortic valve.  The aortic valve is  excised sharply.  The aortic annulus is decalcified.  This is fairly tedious  due to the degree of calcification in the aortic annulus, but ultimately  decalcification is satisfactorily completed without any significant issues  or complications.  The aortic annulus is notably relatively small caliber,  as is the proximal ascending thoracic aorta.  The aortic annulus and root  are sized carefully for aortic valve replacement.  Of note, a 21-mm Edwards  Magna series stented bioprosthetic sizer will  not fit in the aortic annulus.  Given the patient's large size and body habitus, and 19-mm stented  bioprosthetic valve is felt to be contraindicated.  A 21-mm stentless sizer  fits perfectly in the aortic annulus and at the sinotubular junction.  The  aortic root is irrigated with copious iced saline solution to rinse out any  possible residual particulate debris.   The aortic valve replacement is performed using a [employer] [place]  stentless porcine bioprosthetic tissue valve (model number [id number]-101-21,  serial number [id number]).  The proximal suture line is constructed with  interrupted 4-0 Ethibond simple sutures placed circumferentially around the  aortic annulus in a plane corresponding to the lowest level of the aortic  annulus in the middle portion of each of the cusps of the valve.  Once the  proximal  suture line is completed, the distal suture line is then performed  with running 4-0 Prolene suture circumferentially around the aortic root  beneath the left main coronary artery and the right coronary artery and in  the noncoronary sinus of Valsalva.  Prior to completing the distal suture  line, each of the 3 commissures of the stentless valve are temporarily  tacked to the aortic wall in appropriate location to ensure appropriate  spacing and geometry of the commissures of the valve.  Once the distal  suture line is completed, the valve is carefully inspected and appears to be  well-seated symmetrically with normal-appearing coaptation of the leaflets.  The distal suture line is well-below the left main coronary artery and the  right coronary artery, and the valve appears to function perfectly normally.   Rewarming is begun.  The aortotomy is closed using a two-layer closure of running 4-0 Prolene suture.  The patient is placed in Trendelenburg  position.  One final dose of warm retrograde hot-shot cardioplegia is  administered.  The lungs were ventilated and the heart allowed to fill while  all residual air is evacuated through the aortic root.  The aortic  crossclamp is removed after a total crossclamp time of 148 minutes.   The retrograde cardioplegic catheter is removed.  The heart begins to beat  spontaneously without need for cardioversion.  The aortotomy suture line is  carefully inspected for hemostasis.  Epicardial pacing wires are affixed to  the right ventricular outflow tract and to the right atrial appendage.  The  patient is rewarmed to 37 degrees centigrade temperature.  The left  ventricular vent is removed.  The lungs are ventilated and the heart allowed  to eject.  There is some residual air appreciated on transesophageal  echocardiogram and the patient is maintained in Trendelenburg position with  the aortic root vent in place until all the air has cleared  satisfactorily.  The patient is subsequently weaned from cardiopulmonary bypass without  difficulty.  The patient's rhythm at separation from bypass is a junctional  rhythm.  AV sequential pacing is employed.  Total cardiopulmonary bypass  time for the operation is 179 minutes.  No inotropic support is required.   Followup transesophageal echocardiogram performed by Dr. Gypsy Balsam after  separation from bypass demonstrates a normal-appearing aortic valve.  There  is no aortic insufficiency whatsoever and there is no sign of perivalvular  leak.  Left ventricular function appears preserved.  There is trivial mitral  regurgitation.  There is insignificant residual air.  No other abnormalities  are noted.   The aortic root vent is removed.  The venous and aortic cannula are removed.  Protamine is administered to reverse the anticoagulation.  The mediastinum  is irrigated with saline solution containing vancomycin.  Meticulous  surgical hemostasis is ascertained.  The mediastinum and right pleural space  are drained using 34 chest tubes exited through separate stab incisions  inferiorly.  The median sternotomy is closed with a double-strength sternal  wire.  The soft tissues anterior to the sternum are closed in multiple  layers and the skin is closed with a running subcuticular skin closure.   The patient tolerated the procedure well and is transported to the surgical  intensive care unit in stable condition.  There are no intraoperative  complications.  All sponge, instrument and needle counts are verified  correct at completion of the operation.  No blood products were  administered.       CHO/MEDQ  D:  08/19/2004  T:  08/20/2004  Job:  [id number]   cc:   Rollene Rotunda, M.D.  [address]  [address]  [place]  [place] [address]   Ernestina Penna, M.D.  [address] [place]  [place] [address]  Fax: [phone]

## 2010-08-04 ENCOUNTER — Encounter: Payer: Self-pay | Admitting: Cardiology

## 2010-08-04 ENCOUNTER — Ambulatory Visit (INDEPENDENT_AMBULATORY_CARE_PROVIDER_SITE_OTHER): Payer: Medicare Other | Admitting: Cardiology

## 2010-08-04 DIAGNOSIS — E669 Obesity, unspecified: Secondary | ICD-10-CM

## 2010-08-04 DIAGNOSIS — I6529 Occlusion and stenosis of unspecified carotid artery: Secondary | ICD-10-CM

## 2010-08-04 DIAGNOSIS — I10 Essential (primary) hypertension: Secondary | ICD-10-CM

## 2010-08-04 DIAGNOSIS — I359 Nonrheumatic aortic valve disorder, unspecified: Secondary | ICD-10-CM

## 2010-08-04 DIAGNOSIS — Z954 Presence of other heart-valve replacement: Secondary | ICD-10-CM

## 2010-08-04 NOTE — Assessment & Plan Note (Signed)
He had stable valve replacement last year on echo. No change in therapy is indicated.

## 2010-08-04 NOTE — Assessment & Plan Note (Signed)
The blood pressure is at target. No change in medications is indicated. We will continue with therapeutic lifestyle changes (TLC).  

## 2010-08-04 NOTE — Progress Notes (Signed)
HPI The patient presents for yearly followup. Since I last saw him he has had no new cardiovascular complaints. He does a little walking for exercise. He has no new symptoms related to this. He denies any fevers or chills. He has no cough PND or orthopnea. He denies any chest pressure, neck or arm discomfort. He has some mild lower extremity edema. This has been chronic.  Allergies  Allergen Reactions  . Phenothiazines Anaphylaxis  . Pioglitazone     REACTION: edema    Current Outpatient Prescriptions  Medication Sig Dispense Refill  . acarbose (PRECOSE) 100 MG tablet Take 100 mg by mouth 3 (three) times daily with meals.        Marland Kitchen aspirin 81 MG tablet Take 81 mg by mouth daily.        . citalopram (CELEXA) 20 MG tablet Take 20 mg by mouth daily.        . furosemide (LASIX) 40 MG tablet Take 40 mg by mouth daily.        Marland Kitchen glyBURIDE (DIABETA) 5 MG tablet Take 5 mg by mouth 2 (two) times daily with a meal.        . insulin detemir (LEVEMIR) 100 UNIT/ML injection Inject 80 Units into the skin 2 (two) times daily.       Marland Kitchen levothyroxine (SYNTHROID, LEVOTHROID) 88 MCG tablet Take 88 mcg by mouth daily.        Marland Kitchen lisinopril (PRINIVIL,ZESTRIL) 20 MG tablet Take 20 mg by mouth daily.        . metoprolol tartrate (LOPRESSOR) 25 MG tablet Half tab bid       . Multiple Vitamins-Minerals (MENS MULTI VITAMIN & MINERAL PO) Take 1 tablet by mouth daily.        . Omega-3 Fatty Acids (FISH OIL) 1200 MG CAPS 1 capsule by mouth q am, 2 capsules by mouth q pm       . potassium chloride (K-DUR,KLOR-CON) 10 MEQ tablet Take 10 mEq by mouth daily.        . simvastatin (ZOCOR) 40 MG tablet Take 40 mg by mouth at bedtime.          Past Medical History  Diagnosis Date  . HYPERTENSION 01/22/2009  . 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 BPH (benign prostatic hypertrophy) 04/15/2010  . AORTIC VALVE REPLACEMENT, HX OF 05/27/2009  . DEPRESSION 01/22/2009  . HYPERCHOLESTEROLEMIA 01/22/2009  . 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  Aortic stenosis   . Morbid obesity     Past Surgical History  Procedure Date  . Appendectomy   . Aortic valve replacement July 2006    #20 stentless [place] porcine valve  . Dental surgery 05/2004    Dental extractions  . Heel spur surgery     resection of heel spur    ROS:   Back pain.  Otherwise as stated in the HPI and negative for all other systems.   PHYSICAL EXAM BP 138/88  Pulse 65  Resp 16  Ht 6' (1.829 m)  Wt 315 lb (142.883 kg)  BMI 42.72 kg/m2 GENERAL:  Well appearing HEENT:  Pupils equal round and reactive, fundi not visualized, oral mucosa unremarkable NECK:  No jugular venous distention, waveform within normal limits, carotid upstroke brisk and symmetric, no bruits, no thyromegaly LYMPHATICS:  No cervical, inguinal adenopathy LUNGS:  Clear to auscultation bilaterally BACK:  No CVA tenderness CHEST:  Well healed sternotomy scar. HEART:  PMI not displaced or sustained,S1 and S2 within normal limits, no S3, no S4, no clicks,  no rubs, systolic murmur mid peaking  ABD:  Flat, positive bowel sounds normal in frequency in pitch, no bruits, no rebound, no guarding, no midline pulsatile mass, no hepatomegaly, no splenomegaly, obese EXT:  2 plus pulses throughout, no edema, no cyanosis no clubbing SKIN:  No rashes no nodules NEURO:  Cranial nerves II through XII grossly intact, motor grossly intact throughout PSYCH:  Cognitively intact, oriented to person place and time  EKG:  Sinus rhythm, rate 65 pounds birth date AV block, RBBB  ASSESSMENT AND PLAN

## 2010-08-04 NOTE — Assessment & Plan Note (Signed)
He had mild bilateral plaquing and reviewed the most recent Doppler's. He is due for repeat in January 2014

## 2010-08-04 NOTE — Assessment & Plan Note (Signed)
The patient understands the need to lose weight with diet and exercise. We have discussed specific strategies for this.  

## 2010-08-04 NOTE — Patient Instructions (Signed)
Follow up in 1 year with Dr Hochrein.  You will receive a letter in the mail 2 months before you are due.  Please call us when you receive this letter to schedule your follow up appointment. Continue your current medications as listed 

## 2010-10-29 ENCOUNTER — Ambulatory Visit: Payer: Medicare Other | Admitting: Endocrinology

## 2011-01-31 DIAGNOSIS — E1159 Type 2 diabetes mellitus with other circulatory complications: Secondary | ICD-10-CM | POA: Diagnosis not present

## 2011-01-31 DIAGNOSIS — E119 Type 2 diabetes mellitus without complications: Secondary | ICD-10-CM | POA: Diagnosis not present

## 2011-01-31 DIAGNOSIS — E1149 Type 2 diabetes mellitus with other diabetic neurological complication: Secondary | ICD-10-CM | POA: Diagnosis not present

## 2011-03-30 DIAGNOSIS — H402290 Chronic angle-closure glaucoma, unspecified eye, stage unspecified: Secondary | ICD-10-CM | POA: Diagnosis not present

## 2011-03-30 DIAGNOSIS — E1139 Type 2 diabetes mellitus with other diabetic ophthalmic complication: Secondary | ICD-10-CM | POA: Diagnosis not present

## 2011-03-30 DIAGNOSIS — H43399 Other vitreous opacities, unspecified eye: Secondary | ICD-10-CM | POA: Diagnosis not present

## 2011-03-30 DIAGNOSIS — E11319 Type 2 diabetes mellitus with unspecified diabetic retinopathy without macular edema: Secondary | ICD-10-CM | POA: Diagnosis not present

## 2011-03-30 DIAGNOSIS — H35039 Hypertensive retinopathy, unspecified eye: Secondary | ICD-10-CM | POA: Diagnosis not present

## 2011-04-05 DIAGNOSIS — R5383 Other fatigue: Secondary | ICD-10-CM | POA: Diagnosis not present

## 2011-04-05 DIAGNOSIS — E039 Hypothyroidism, unspecified: Secondary | ICD-10-CM | POA: Diagnosis not present

## 2011-04-05 DIAGNOSIS — R5381 Other malaise: Secondary | ICD-10-CM | POA: Diagnosis not present

## 2011-04-05 DIAGNOSIS — Z125 Encounter for screening for malignant neoplasm of prostate: Secondary | ICD-10-CM | POA: Diagnosis not present

## 2011-04-05 DIAGNOSIS — E559 Vitamin D deficiency, unspecified: Secondary | ICD-10-CM | POA: Diagnosis not present

## 2011-04-05 DIAGNOSIS — E785 Hyperlipidemia, unspecified: Secondary | ICD-10-CM | POA: Diagnosis not present

## 2011-04-05 DIAGNOSIS — IMO0001 Reserved for inherently not codable concepts without codable children: Secondary | ICD-10-CM | POA: Diagnosis not present

## 2011-04-05 DIAGNOSIS — I10 Essential (primary) hypertension: Secondary | ICD-10-CM | POA: Diagnosis not present

## 2011-04-26 DIAGNOSIS — R32 Unspecified urinary incontinence: Secondary | ICD-10-CM | POA: Diagnosis not present

## 2011-04-26 DIAGNOSIS — N400 Enlarged prostate without lower urinary tract symptoms: Secondary | ICD-10-CM | POA: Diagnosis not present

## 2011-04-26 DIAGNOSIS — N390 Urinary tract infection, site not specified: Secondary | ICD-10-CM | POA: Diagnosis not present

## 2011-04-26 DIAGNOSIS — N320 Bladder-neck obstruction: Secondary | ICD-10-CM | POA: Diagnosis not present

## 2011-05-02 DIAGNOSIS — E11339 Type 2 diabetes mellitus with moderate nonproliferative diabetic retinopathy without macular edema: Secondary | ICD-10-CM | POA: Diagnosis not present

## 2011-05-02 DIAGNOSIS — H2510 Age-related nuclear cataract, unspecified eye: Secondary | ICD-10-CM | POA: Diagnosis not present

## 2011-05-02 DIAGNOSIS — H43829 Vitreomacular adhesion, unspecified eye: Secondary | ICD-10-CM | POA: Diagnosis not present

## 2011-05-02 DIAGNOSIS — E1139 Type 2 diabetes mellitus with other diabetic ophthalmic complication: Secondary | ICD-10-CM | POA: Diagnosis not present

## 2011-05-20 DIAGNOSIS — H04209 Unspecified epiphora, unspecified lacrimal gland: Secondary | ICD-10-CM | POA: Diagnosis not present

## 2011-05-20 DIAGNOSIS — H402290 Chronic angle-closure glaucoma, unspecified eye, stage unspecified: Secondary | ICD-10-CM | POA: Diagnosis not present

## 2011-05-25 DIAGNOSIS — E1159 Type 2 diabetes mellitus with other circulatory complications: Secondary | ICD-10-CM | POA: Diagnosis not present

## 2011-05-25 DIAGNOSIS — E119 Type 2 diabetes mellitus without complications: Secondary | ICD-10-CM | POA: Diagnosis not present

## 2011-05-25 DIAGNOSIS — E1149 Type 2 diabetes mellitus with other diabetic neurological complication: Secondary | ICD-10-CM | POA: Diagnosis not present

## 2011-05-25 DIAGNOSIS — N400 Enlarged prostate without lower urinary tract symptoms: Secondary | ICD-10-CM | POA: Diagnosis not present

## 2011-08-11 DIAGNOSIS — E119 Type 2 diabetes mellitus without complications: Secondary | ICD-10-CM | POA: Diagnosis not present

## 2011-08-11 DIAGNOSIS — R5381 Other malaise: Secondary | ICD-10-CM | POA: Diagnosis not present

## 2011-08-11 DIAGNOSIS — E559 Vitamin D deficiency, unspecified: Secondary | ICD-10-CM | POA: Diagnosis not present

## 2011-08-11 DIAGNOSIS — IMO0001 Reserved for inherently not codable concepts without codable children: Secondary | ICD-10-CM | POA: Diagnosis not present

## 2011-08-15 DIAGNOSIS — E1149 Type 2 diabetes mellitus with other diabetic neurological complication: Secondary | ICD-10-CM | POA: Diagnosis not present

## 2011-08-15 DIAGNOSIS — E119 Type 2 diabetes mellitus without complications: Secondary | ICD-10-CM | POA: Diagnosis not present

## 2011-08-15 DIAGNOSIS — E1159 Type 2 diabetes mellitus with other circulatory complications: Secondary | ICD-10-CM | POA: Diagnosis not present

## 2011-09-21 ENCOUNTER — Encounter: Payer: Self-pay | Admitting: Cardiology

## 2011-09-21 ENCOUNTER — Ambulatory Visit (INDEPENDENT_AMBULATORY_CARE_PROVIDER_SITE_OTHER): Payer: Medicare Other | Admitting: Cardiology

## 2011-09-21 VITALS — BP 130/80 | HR 73 | Ht 73.0 in | Wt 313.0 lb

## 2011-09-21 DIAGNOSIS — I10 Essential (primary) hypertension: Secondary | ICD-10-CM | POA: Diagnosis not present

## 2011-09-21 DIAGNOSIS — E7800 Pure hypercholesterolemia, unspecified: Secondary | ICD-10-CM

## 2011-09-21 DIAGNOSIS — I6529 Occlusion and stenosis of unspecified carotid artery: Secondary | ICD-10-CM | POA: Diagnosis not present

## 2011-09-21 DIAGNOSIS — Z954 Presence of other heart-valve replacement: Secondary | ICD-10-CM | POA: Diagnosis not present

## 2011-09-21 NOTE — Patient Instructions (Addendum)
The current medical regimen is effective;  continue present plan and medications.  Follow up in 1 year with Dr Hochrein.  You will receive a letter in the mail 2 months before you are due.  Please call us when you receive this letter to schedule your follow up appointment.  

## 2011-09-21 NOTE — Progress Notes (Signed)
HPI The patient presents for yearly followup. Since I last saw him he has had no new cardiovascular complaints. He says that he is walking more for exercise. He denies any shortness of breath, PND or orthopnea. He's not been noticing any palpitations, presyncope or syncope. He has had no weight gain or edema. Unfortunately he's had no weight loss.  Allergies  Allergen Reactions  . Phenothiazines Anaphylaxis  . Pioglitazone     REACTION: edema    Current Outpatient Prescriptions  Medication Sig Dispense Refill  . acarbose (PRECOSE) 100 MG tablet Take 100 mg by mouth 3 (three) times daily with meals.        Marland Kitchen aspirin 81 MG tablet Take 81 mg by mouth daily.        . citalopram (CELEXA) 20 MG tablet Take 20 mg by mouth daily.        . furosemide (LASIX) 40 MG tablet Take 40 mg by mouth daily.        Marland Kitchen glyBURIDE (DIABETA) 5 MG tablet Take 5 mg by mouth 2 (two) times daily with a meal.        . insulin detemir (LEVEMIR) 100 UNIT/ML injection Inject 78 Units into the skin 2 (two) times daily.       Marland Kitchen levothyroxine (SYNTHROID, LEVOTHROID) 88 MCG tablet Take 88 mcg by mouth daily.        Marland Kitchen lisinopril (PRINIVIL,ZESTRIL) 20 MG tablet Take 20 mg by mouth daily.        . metoprolol tartrate (LOPRESSOR) 25 MG tablet Half tab bid       . Multiple Vitamins-Minerals (MENS MULTI VITAMIN & MINERAL PO) Take 1 tablet by mouth daily.        . Omega-3 Fatty Acids (FISH OIL) 1200 MG CAPS 1 capsule by mouth q am, 2 capsules by mouth q pm       . potassium chloride (K-DUR,KLOR-CON) 10 MEQ tablet Take 10 mEq by mouth daily.        . simvastatin (ZOCOR) 40 MG tablet Take 40 mg by mouth at bedtime.          Past Medical History  Diagnosis Date  . HYPERTENSION 01/22/2009  . 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 BPH (benign prostatic hypertrophy) 04/15/2010  . AORTIC VALVE REPLACEMENT, HX OF 05/27/2009  . DEPRESSION 01/22/2009  . HYPERCHOLESTEROLEMIA 01/22/2009  . 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 Aortic stenosis     . Morbid obesity     Past Surgical History  Procedure Date  . Appendectomy   . Aortic valve replacement July 2006    #20 stentless [place] porcine valve  . Dental surgery 05/2004    Dental extractions  . Heel spur surgery     resection of heel spur    ROS:   As stated in the HPI and negative for all other systems.   PHYSICAL EXAM BP 130/80  Pulse 73  Ht 6\' 1"  (1.854 m)  Wt 313 lb (141.976 kg)  BMI 41.30 kg/m2 GENERAL:  Well appearing HEENT:  Pupils equal round and reactive, fundi not visualized, oral mucosa unremarkable, poor dentition NECK:  No jugular venous distention, waveform within normal limits, carotid upstroke brisk and symmetric, no bruits, no thyromegaly LUNGS:  Clear to auscultation bilaterally BACK:  No CVA tenderness CHEST:  Well healed sternotomy scar. HEART:  PMI not displaced or sustained,S1 and S2 within normal limits, no S3, no S4 (very distant heart sounds) no clicks, no rubs, systolic murmur mid peaking and  radiating into the  carotids  ABD:  Flat, positive bowel sounds normal in frequency in pitch, no bruits, no rebound, no guarding, no midline pulsatile mass, no hepatomegaly, no splenomegaly, obese EXT:  2 plus pulses throughout, no edema, no cyanosis no clubbing   EKG:  Sinus rhythm, rate 73,  RBBB, no acute ST T wave changes.  09/21/2011  ASSESSMENT AND PLAN  AORTIC VALVE REPLACEMENT, HX OF - He had stable valve replacement on the last echo in 2011. No change in therapy is indicated.   I will consider repeat echo next year.  HYPERTENSION -  The blood pressure is at target. No change in medications is indicated. We will continue with therapeutic lifestyle changes (TLC).   CAROTID OCCLUSIVE DISEASE -  He had mild bilateral plaquing and reviewed the most recent Doppler's. He is due for repeat in January 2014  Obesity -  The patient understands the need to lose weight with diet and exercise. We have discussed specific strategies for this.

## 2011-09-29 DIAGNOSIS — M5137 Other intervertebral disc degeneration, lumbosacral region: Secondary | ICD-10-CM | POA: Diagnosis not present

## 2011-10-14 DIAGNOSIS — E11349 Type 2 diabetes mellitus with severe nonproliferative diabetic retinopathy without macular edema: Secondary | ICD-10-CM | POA: Diagnosis not present

## 2011-10-14 DIAGNOSIS — E1139 Type 2 diabetes mellitus with other diabetic ophthalmic complication: Secondary | ICD-10-CM | POA: Diagnosis not present

## 2011-10-14 DIAGNOSIS — H43829 Vitreomacular adhesion, unspecified eye: Secondary | ICD-10-CM | POA: Diagnosis not present

## 2011-10-21 DIAGNOSIS — N390 Urinary tract infection, site not specified: Secondary | ICD-10-CM | POA: Diagnosis not present

## 2011-10-25 ENCOUNTER — Ambulatory Visit: Payer: Medicare Other | Admitting: Orthopedic Surgery

## 2011-10-28 DIAGNOSIS — H402290 Chronic angle-closure glaucoma, unspecified eye, stage unspecified: Secondary | ICD-10-CM | POA: Diagnosis not present

## 2011-10-28 DIAGNOSIS — H5200 Hypermetropia, unspecified eye: Secondary | ICD-10-CM | POA: Diagnosis not present

## 2011-10-28 DIAGNOSIS — H538 Other visual disturbances: Secondary | ICD-10-CM | POA: Diagnosis not present

## 2011-10-28 DIAGNOSIS — H2510 Age-related nuclear cataract, unspecified eye: Secondary | ICD-10-CM | POA: Diagnosis not present

## 2011-10-28 DIAGNOSIS — H43399 Other vitreous opacities, unspecified eye: Secondary | ICD-10-CM | POA: Diagnosis not present

## 2011-10-30 DIAGNOSIS — K5900 Constipation, unspecified: Secondary | ICD-10-CM | POA: Diagnosis not present

## 2011-10-30 DIAGNOSIS — R109 Unspecified abdominal pain: Secondary | ICD-10-CM | POA: Diagnosis not present

## 2011-10-30 DIAGNOSIS — N390 Urinary tract infection, site not specified: Secondary | ICD-10-CM | POA: Diagnosis not present

## 2011-10-30 DIAGNOSIS — Z79899 Other long term (current) drug therapy: Secondary | ICD-10-CM | POA: Diagnosis not present

## 2011-10-30 DIAGNOSIS — E119 Type 2 diabetes mellitus without complications: Secondary | ICD-10-CM | POA: Diagnosis not present

## 2011-11-22 DIAGNOSIS — H269 Unspecified cataract: Secondary | ICD-10-CM | POA: Diagnosis not present

## 2011-11-22 DIAGNOSIS — H2510 Age-related nuclear cataract, unspecified eye: Secondary | ICD-10-CM | POA: Diagnosis not present

## 2011-12-05 DIAGNOSIS — E1149 Type 2 diabetes mellitus with other diabetic neurological complication: Secondary | ICD-10-CM | POA: Diagnosis not present

## 2011-12-05 DIAGNOSIS — E1159 Type 2 diabetes mellitus with other circulatory complications: Secondary | ICD-10-CM | POA: Diagnosis not present

## 2011-12-05 DIAGNOSIS — E119 Type 2 diabetes mellitus without complications: Secondary | ICD-10-CM | POA: Diagnosis not present

## 2011-12-15 DIAGNOSIS — H2510 Age-related nuclear cataract, unspecified eye: Secondary | ICD-10-CM | POA: Diagnosis not present

## 2011-12-27 DIAGNOSIS — E559 Vitamin D deficiency, unspecified: Secondary | ICD-10-CM | POA: Diagnosis not present

## 2011-12-27 DIAGNOSIS — E119 Type 2 diabetes mellitus without complications: Secondary | ICD-10-CM | POA: Diagnosis not present

## 2011-12-27 DIAGNOSIS — E039 Hypothyroidism, unspecified: Secondary | ICD-10-CM | POA: Diagnosis not present

## 2011-12-27 DIAGNOSIS — IMO0001 Reserved for inherently not codable concepts without codable children: Secondary | ICD-10-CM | POA: Diagnosis not present

## 2011-12-27 DIAGNOSIS — I10 Essential (primary) hypertension: Secondary | ICD-10-CM | POA: Diagnosis not present

## 2011-12-27 DIAGNOSIS — E785 Hyperlipidemia, unspecified: Secondary | ICD-10-CM | POA: Diagnosis not present

## 2011-12-29 DIAGNOSIS — Z23 Encounter for immunization: Secondary | ICD-10-CM | POA: Diagnosis not present

## 2012-01-03 DIAGNOSIS — R7989 Other specified abnormal findings of blood chemistry: Secondary | ICD-10-CM | POA: Diagnosis not present

## 2012-02-08 DIAGNOSIS — R070 Pain in throat: Secondary | ICD-10-CM | POA: Diagnosis not present

## 2012-02-08 DIAGNOSIS — R7309 Other abnormal glucose: Secondary | ICD-10-CM | POA: Diagnosis not present

## 2012-02-08 DIAGNOSIS — I10 Essential (primary) hypertension: Secondary | ICD-10-CM | POA: Diagnosis not present

## 2012-02-08 DIAGNOSIS — J019 Acute sinusitis, unspecified: Secondary | ICD-10-CM | POA: Diagnosis not present

## 2012-02-10 DIAGNOSIS — R7989 Other specified abnormal findings of blood chemistry: Secondary | ICD-10-CM | POA: Diagnosis not present

## 2012-02-10 DIAGNOSIS — E876 Hypokalemia: Secondary | ICD-10-CM | POA: Diagnosis not present

## 2012-02-12 DIAGNOSIS — E875 Hyperkalemia: Secondary | ICD-10-CM | POA: Diagnosis not present

## 2012-02-12 DIAGNOSIS — N289 Disorder of kidney and ureter, unspecified: Secondary | ICD-10-CM | POA: Diagnosis not present

## 2012-02-12 DIAGNOSIS — N189 Chronic kidney disease, unspecified: Secondary | ICD-10-CM | POA: Diagnosis not present

## 2012-02-14 DIAGNOSIS — H269 Unspecified cataract: Secondary | ICD-10-CM | POA: Diagnosis not present

## 2012-02-14 DIAGNOSIS — H2510 Age-related nuclear cataract, unspecified eye: Secondary | ICD-10-CM | POA: Diagnosis not present

## 2012-02-29 ENCOUNTER — Encounter: Payer: Self-pay | Admitting: Orthopedic Surgery

## 2012-02-29 ENCOUNTER — Ambulatory Visit (INDEPENDENT_AMBULATORY_CARE_PROVIDER_SITE_OTHER): Payer: Medicare Other | Admitting: Orthopedic Surgery

## 2012-02-29 VITALS — BP 122/60 | Ht 73.0 in | Wt 307.4 lb

## 2012-02-29 DIAGNOSIS — M720 Palmar fascial fibromatosis [Dupuytren]: Secondary | ICD-10-CM | POA: Diagnosis not present

## 2012-02-29 NOTE — Patient Instructions (Signed)
Patient will call hand surgeon

## 2012-02-29 NOTE — Progress Notes (Signed)
Patient ID: [name], male   DOB: Oct 27, 1945, 67 y.o.   MRN: [id number] Chief Complaint  Patient presents with  . Hand Pain    ring finger right hand pain     History the patient had a left Dupuytren's release in [facility] presents now with Dupuytren's contracture of the right side and catching and locking of his right ring finger no pain seems to be worsening came on gradually  Medical history as noted  14 system review reveals positive findings of history of heart murmur some joint pain tremors and easy bleeding all other systems were normal  Exam shows a Dupuytren's contracture of the right hand mainly involving the ring finger with some involvement of the long and small finger in approximately 45 contracture at the PIP joint of the right long finger no neurovascular deficits at this time. There are characteristic contractures and dimpling in the palm as well. Can flex down to a full fist but cannot open to full extension. He is otherwise well-developed well-nourished grooming and hygiene are normal mood and affect are normal he walks with assistive device  Impression.diag 1. Dupuytren's contracture of both hands      He would like to make the referral to the hand surgeon who did his last hand surgery he did not want [name] to make the phone call.  Medical decision-making new problem no further workup plan patient will make self-referral no x-rays

## 2012-03-02 DIAGNOSIS — N189 Chronic kidney disease, unspecified: Secondary | ICD-10-CM | POA: Diagnosis not present

## 2012-03-02 DIAGNOSIS — IMO0001 Reserved for inherently not codable concepts without codable children: Secondary | ICD-10-CM | POA: Diagnosis not present

## 2012-03-05 DIAGNOSIS — Z1212 Encounter for screening for malignant neoplasm of rectum: Secondary | ICD-10-CM | POA: Diagnosis not present

## 2012-03-29 DIAGNOSIS — M5137 Other intervertebral disc degeneration, lumbosacral region: Secondary | ICD-10-CM | POA: Diagnosis not present

## 2012-03-29 DIAGNOSIS — M6530 Trigger finger, unspecified finger: Secondary | ICD-10-CM | POA: Diagnosis not present

## 2012-03-29 DIAGNOSIS — M720 Palmar fascial fibromatosis [Dupuytren]: Secondary | ICD-10-CM | POA: Diagnosis not present

## 2012-04-02 ENCOUNTER — Encounter: Payer: Self-pay | Admitting: Urgent Care

## 2012-04-02 ENCOUNTER — Ambulatory Visit (INDEPENDENT_AMBULATORY_CARE_PROVIDER_SITE_OTHER): Payer: Medicare Other | Admitting: Urgent Care

## 2012-04-02 VITALS — BP 126/78 | HR 68 | Temp 97.6°F | Ht 73.0 in | Wt 304.6 lb

## 2012-04-02 DIAGNOSIS — D649 Anemia, unspecified: Secondary | ICD-10-CM | POA: Diagnosis not present

## 2012-04-02 DIAGNOSIS — K921 Melena: Secondary | ICD-10-CM | POA: Diagnosis not present

## 2012-04-02 DIAGNOSIS — R195 Other fecal abnormalities: Secondary | ICD-10-CM | POA: Insufficient documentation

## 2012-04-02 MED ORDER — PEG 3350-KCL-NA BICARB-NACL 420 G PO SOLR: 4000.0000 mL | ORAL | Status: DC

## 2012-04-02 NOTE — Progress Notes (Signed)
Referring Lelend Heinecke: Lauralyn Primes Primary Care Physician:  Horald Pollen., PA-C Primary Gastroenterologist:  Dr. Jena Gauss  Chief Complaint  Patient presents with  . GI Bleeding    Heme positive    HPI:  [name] is a 67 y.o. male here as a referral from [facility] for hemoccult positive stool, anemia & melena.  Pt returned 2 cards to PCP.  Stool was hemoccult positive.  He had a single episode of melena a couple weeks.  Last Hgb 12.7 in 02/2012.  He denies any upper GI symptoms including heartburn, indigestion, nausea, vomiting, dysphagia, odynophagia or anorexia.  He has been trying to lose weight by cutting portion size.  He reports difficulty with ambulating due to a "bad back".  He was taking tylenol for pain, denies NSAIDS.   Denies constipation, diarrhea.  Intentional 15# weight loss in past few months.  Last colonoscopy was normal by Dr Christella Hartigan in March 2009.  He gives hx of colon polyps prior to that colonoscopy.  Past Medical History  Diagnosis Date  . HYPERTENSION 01/22/2009  . 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 BPH (benign prostatic hypertrophy) 04/15/2010  . DEPRESSION 01/22/2009  . HYPERCHOLESTEROLEMIA 01/22/2009  . 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 Morbid obesity   . Heart valve replaced   . Colon polyps     Past Surgical History  Procedure Laterality Date  . Appendectomy    . Aortic valve replacement  July 2006    #20 stentless [place] porcine valve  . Dental surgery  05/2004    Dental extractions  . Heel spur surgery      resection of heel spur  . Colonoscopy  04/24/2007    Christella Hartigan: normal    Current Outpatient Prescriptions  Medication Sig Dispense Refill  . acarbose (PRECOSE) 100 MG tablet Take 100 mg by mouth 3 (three) times daily with meals.        Marland Kitchen aspirin 81 MG tablet Take 81 mg by mouth daily.        . citalopram (CELEXA) 20 MG tablet Take 20 mg by mouth daily.        . furosemide (LASIX) 40 MG tablet Take 40 mg by mouth daily.        .  insulin detemir (LEVEMIR) 100 UNIT/ML injection Inject 80 Units into the skin 2 (two) times daily.       Marland Kitchen levothyroxine (SYNTHROID, LEVOTHROID) 88 MCG tablet Take 88 mcg by mouth daily.        Marland Kitchen lisinopril (PRINIVIL,ZESTRIL) 20 MG tablet Take 20 mg by mouth daily.        . metoprolol tartrate (LOPRESSOR) 25 MG tablet Take 12.5 mg by mouth 2 (two) times daily.       . Multiple Vitamins-Minerals (MENS MULTI VITAMIN & MINERAL PO) Take 1 tablet by mouth daily.        . Omega-3 Fatty Acids (FISH OIL) 1200 MG CAPS Take 1-2 capsules by mouth 2 (two) times daily. 1 capsule by mouth q am, 2 capsules by mouth q pm      . simvastatin (ZOCOR) 40 MG tablet Take 40 mg by mouth at bedtime.        Marland Kitchen glyBURIDE (DIABETA) 5 MG tablet Take 5 mg by mouth 2 (two) times daily.      . polyethylene glycol-electrolytes (TRILYTE) 420 G solution Take 4,000 mLs by mouth as directed.  4000 mL  0   No current facility-administered medications for this visit.    Allergies as of 04/02/2012 -  Review Complete 04/02/2012  Allergen Reaction Noted  . Phenothiazines Anaphylaxis 04/15/2010  . Pioglitazone  04/03/2009    Family History  Problem Relation Age of Onset  . Diabetes      History   Social History  . Marital Status: Divorced    Spouse Name: N/A    Number of Children: 1  . Years of Education: N/A   Occupational History  . Truck [employer] doctor, retired    Social History Main Topics  . Smoking status: Former Smoker    Quit date: 09/20/1961  . Smokeless tobacco: Not on file  . Alcohol Use: No  . Drug Use: Yes    Special: Marijuana  . Sexually Active: Not on file   Other Topics Concern  . Not on file   Social History Narrative   Lives alone.   Quit smoking approximately 28 years ago.   Long haul truck driver, lives in [place].    Review of Systems: Gen: Denies any fever, chills, sweats, anorexia, fatigue, weakness, malaise, weight loss, and sleep disorder CV: Denies chest pain, angina, palpitations,  syncope, orthopnea, PND, peripheral edema, and claudication. Resp: Denies dyspnea at rest, dyspnea with exercise, cough, sputum, wheezing, coughing up blood, and pleurisy. GI: Denies vomiting blood, jaundice, and fecal incontinence.   Denies dysphagia or odynophagia. GU : Denies urinary burning, blood in urine, urinary frequency, urinary hesitancy, nocturnal urination. MS: See HPI.  Derm: Denies rash, itching, dry skin, hives, moles, warts, or unhealing ulcers.  Psych: Denies depression, anxiety, memory loss, suicidal ideation, hallucinations, paranoia, and confusion. Heme: Denies bruising, bleeding, and enlarged lymph nodes. Neuro:  Denies any headaches, dizziness, paresthesias. Endo:  Denies any problems with DM, thyroid, adrenal function.  Physical Exam: BP 126/78  Pulse 68  Temp(Src) 97.6 F (36.4 C) (Oral)  Ht 6\' 1"  (1.854 m)  Wt 304 lb 9.6 oz (138.166 kg)  BMI 40.2 kg/m2 No LMP for male patient. General:   Alert,  Well-developed, obese, pleasant and cooperative in NAD Head:  Normocephalic and atraumatic. Eyes:  Sclera clear, no icterus.   Conjunctiva pink. Ears:  Normal auditory acuity. Nose:  No deformity, discharge, or lesions. Mouth:  No deformity or lesions,oropharynx pink & moist. Neck:  Supple; no masses or thyromegaly. Lungs:  Clear throughout to auscultation.   No wheezes, crackles, or rhonchi. No acute distress. Heart:  Regular rate and rhythm; no murmurs, clicks, rubs,  or gallops. Abdomen:  Protuberant.  Normal bowel sounds.  No bruits.  Soft, non-tender and non-distended without masses, hepatosplenomegaly or hernias noted.  No guarding or rebound tenderness.  Exam limited due to body habitus. Rectal:  Deferred.  Msk:  Symmetrical without gross deformities. Normal posture. Pulses:  Normal pulses noted. Extremities:  No edema. Neurologic:  Alert and oriented x4;  grossly normal neurologically. Skin:  Intact without significant lesions or rashes. Lymph Nodes:  No  significant cervical adenopathy. Psych:  Alert and cooperative. Normal mood and affect.

## 2012-04-02 NOTE — Patient Instructions (Addendum)
Colonoscopy & EGD with Dr Jena Gauss Take half of your diabetes medications (PRECOSE & DIABETA)the day prior to your procedure Hold diabetes medications day of procedure Bring all your medications and/or any insulin to the hospital the day of the procedure. Follow blood sugars, call us or your PCP if any problems. To ER if weakness, heart racing, bleeding or trouble breathing.

## 2012-04-03 ENCOUNTER — Encounter (HOSPITAL_COMMUNITY): Payer: Self-pay | Admitting: Pharmacy Technician

## 2012-04-03 ENCOUNTER — Encounter: Payer: Self-pay | Admitting: Urgent Care

## 2012-04-03 NOTE — Assessment & Plan Note (Signed)
Hgb 12.7.  Further evaluation as above to determine source of GI bleeding.

## 2012-04-03 NOTE — Assessment & Plan Note (Signed)
[name] is a pleasant 67 y.o. male with a single episode of melena, mild anemia & hemoccult positive stool.  EGD with Dr Jena Gauss for further evaluation to look for source of GI bleed.  I have discussed risks & benefits which include, but are not limited to, bleeding, infection, perforation & drug reaction.  The patient agrees with this plan & written consent will be obtained.  Differentials include PUD, gastritis, or small bowel etiology.  Take half of PRECOSE & DIABETA the day prior to your procedure Hold diabetes medications day of procedure Bring all medications and/or any insulin to the hospital the day of the procedure. Follow blood sugars, call us or PCP if any problems. To ER if weakness, heart racing, bleeding or trouble breathing.

## 2012-04-03 NOTE — Assessment & Plan Note (Signed)
Hemoccult positive stool on card.  Report of single episode of melena recently.  Hx colon polyps.  Last colonoscopy 5 years ago by Dr Christella Hartigan normal.  Colonoscopy with Dr Jena Gauss. I have discussed risks & benefits which include, but are not limited to, bleeding, infection, perforation & drug reaction.  The patient agrees with this plan & written consent will be obtained.

## 2012-04-04 ENCOUNTER — Telehealth: Payer: Self-pay | Admitting: Urgent Care

## 2012-04-04 NOTE — Telephone Encounter (Signed)
Open in Error.

## 2012-04-04 NOTE — Progress Notes (Signed)
Faxed to PCP

## 2012-04-05 ENCOUNTER — Encounter (HOSPITAL_COMMUNITY): Payer: Self-pay | Admitting: *Deleted

## 2012-04-05 ENCOUNTER — Ambulatory Visit (HOSPITAL_COMMUNITY)
Admission: RE | Admit: 2012-04-05 | Discharge: 2012-04-05 | Disposition: A | Discharge status: Home / Self Care | Payer: Medicare Other | Source: Ambulatory Visit | Attending: Internal Medicine | Admitting: Internal Medicine

## 2012-04-05 ENCOUNTER — Encounter (HOSPITAL_COMMUNITY)
Admission: RE | Disposition: A | Discharge status: Home / Self Care | Payer: Self-pay | Source: Ambulatory Visit | Attending: Internal Medicine

## 2012-04-05 DIAGNOSIS — Z01812 Encounter for preprocedural laboratory examination: Secondary | ICD-10-CM | POA: Diagnosis not present

## 2012-04-05 DIAGNOSIS — K2970 Gastritis, unspecified, without bleeding: Secondary | ICD-10-CM | POA: Insufficient documentation

## 2012-04-05 DIAGNOSIS — K2990 Gastroduodenitis, unspecified, without bleeding: Secondary | ICD-10-CM | POA: Diagnosis not present

## 2012-04-05 DIAGNOSIS — I10 Essential (primary) hypertension: Secondary | ICD-10-CM | POA: Diagnosis not present

## 2012-04-05 DIAGNOSIS — K921 Melena: Secondary | ICD-10-CM | POA: Insufficient documentation

## 2012-04-05 DIAGNOSIS — D649 Anemia, unspecified: Secondary | ICD-10-CM

## 2012-04-05 DIAGNOSIS — K269 Duodenal ulcer, unspecified as acute or chronic, without hemorrhage or perforation: Secondary | ICD-10-CM

## 2012-04-05 DIAGNOSIS — E119 Type 2 diabetes mellitus without complications: Secondary | ICD-10-CM | POA: Diagnosis not present

## 2012-04-05 DIAGNOSIS — R195 Other fecal abnormalities: Secondary | ICD-10-CM

## 2012-04-05 DIAGNOSIS — Z86010 Personal history of colonic polyps: Secondary | ICD-10-CM

## 2012-04-05 HISTORY — PX: COLONOSCOPY WITH ESOPHAGOGASTRODUODENOSCOPY (EGD): SHX5779

## 2012-04-05 LAB — GLUCOSE, CAPILLARY: Glucose-Capillary: 116 mg/dL — ABNORMAL HIGH (ref 70–99)

## 2012-04-05 SURGERY — COLONOSCOPY WITH ESOPHAGOGASTRODUODENOSCOPY (EGD)
Anesthesia: Moderate Sedation

## 2012-04-05 MED ORDER — ONDANSETRON HCL 4 MG/2ML IJ SOLN
INTRAMUSCULAR | Status: AC
  Filled 2012-04-05: qty 2

## 2012-04-05 MED ORDER — MIDAZOLAM HCL 5 MG/5ML IJ SOLN
INTRAMUSCULAR | Status: DC | PRN
  Administered 2012-04-05 (×2): 2 mg via INTRAVENOUS

## 2012-04-05 MED ORDER — ONDANSETRON HCL 4 MG/2ML IJ SOLN
INTRAMUSCULAR | Status: DC | PRN
  Administered 2012-04-05: 4 mg via INTRAVENOUS

## 2012-04-05 MED ORDER — STERILE WATER FOR IRRIGATION IR SOLN
Status: DC | PRN
  Administered 2012-04-05: 11:00:00

## 2012-04-05 MED ORDER — MEPERIDINE HCL 100 MG/ML IJ SOLN
INTRAMUSCULAR | Status: DC | PRN
  Administered 2012-04-05: 25 mg via INTRAVENOUS

## 2012-04-05 MED ORDER — MEPERIDINE HCL 100 MG/ML IJ SOLN
INTRAMUSCULAR | Status: AC
  Filled 2012-04-05: qty 2

## 2012-04-05 MED ORDER — SODIUM CHLORIDE 0.45 % IV SOLN
INTRAVENOUS | Status: DC
  Administered 2012-04-05: 1000 mL via INTRAVENOUS

## 2012-04-05 MED ORDER — MIDAZOLAM HCL 5 MG/5ML IJ SOLN
INTRAMUSCULAR | Status: AC
  Filled 2012-04-05: qty 10

## 2012-04-05 NOTE — Interval H&P Note (Signed)
History and Physical Interval Note:  04/05/2012 10:53 AM  [name]  has presented today for surgery, with the diagnosis of HEME POSITIVE STOOL, MELENA  AND ANEMIA  The various methods of treatment have been discussed with the patient and family. After consideration of risks, benefits and other options for treatment, the patient has consented to  Procedure(s) with comments: COLONOSCOPY WITH ESOPHAGOGASTRODUODENOSCOPY (EGD) (N/A) - 10;15 as a surgical intervention .  The patient's history has been reviewed, patient examined, no change in status, stable for surgery.  I have reviewed the patient's chart and labs.  Questions were answered to the patient's satisfaction.     Robert Rourk  EGD and colonoscopy per plan.The risks, benefits, limitations, imponderables and alternatives regarding both EGD and colonoscopy have been reviewed with the patient. Questions have been answered. All parties agreeable.

## 2012-04-05 NOTE — Op Note (Signed)
[facility] [address] [place], [address]   ENDOSCOPY PROCEDURE REPORT  PATIENT: [name], [name]  MR#: [id number] BIRTHDATE: Feb 13, 1945 , 66  yrs. old GENDER: Male ENDOSCOPIST: R.  Roetta Sessions, MD [facility] REFERRED BY:  Helene Kelp, PA PROCEDURE DATE:  04/05/2012 PROCEDURE:     EGD with gastric biopsy  INDICATIONS:     Anemia; reported episode of melena. Hemoccult-positive stool  INFORMED CONSENT:   The risks, benefits, limitations, alternatives and imponderables have been discussed.  The potential for biopsy, esophogeal dilation, etc. have also been reviewed.  Questions have been answered.  All parties agreeable.  Please see the history and physical in the medical record for more information.  MEDICATIONS:   Versed 4 mg IV and Demerol 75 mg IV in divided doses. Cetacaine spray. Zofran 4 mg IV  DESCRIPTION OF PROCEDURE:   The Pentax Gastroscope [id number] endoscope was introduced through the mouth and advanced to the second portion of the duodenum without difficulty or limitations. The mucosal surfaces were surveyed very carefully during advancement of the scope and upon withdrawal.  Retroflexion view of the proximal stomach and esophagogastric junction was performed.      FINDINGS: Normal esophagus. Bile-stained gastric mucosa. Diffuse patchy gastric erythema; No ulcer or infiltrating process. Pylorus patent. Examination of the duodenal bulb and second portion revealed a 4 mm ulcer in the bulb with surrounding erosions.  THERAPEUTIC / DIAGNOSTIC MANEUVERS PERFORMED:  Biopsies of the gastric mucosa taken for histology.   COMPLICATIONS:  None  IMPRESSION:  Normal esophagus. Bile-stained gastric mucosa; inflamed-appearing gastric mucosa -  status post biopsy. Small duodenal bulbar ulcer with associated erosions.  RECOMMENDATIONS:  Avoid NSAIDs. PPI therapy. Followup on pathology. See colonoscopy  report.    _______________________________ R. Roetta Sessions, MD FACP [facility] eSigned:  R. Roetta Sessions, MD FACP [facility] 04/05/2012 11:29 AM     CC:

## 2012-04-05 NOTE — H&P (View-Only) (Signed)
Referring Provider: Maier, Andrew C, PA-C Primary Care Physician:  MAIER,ANDREW C., PA-C Primary Gastroenterologist:  Dr. Rourk  Chief Complaint  Patient presents with  . GI Bleeding    Heme positive    HPI:  [name] is a 67 y.o. male here as a referral from Andrew Maier,PAC for hemoccult positive stool, anemia & melena.  Pt returned 2 cards to PCP.  Stool was hemoccult positive.  He had a single episode of melena a couple weeks.  Last Hgb 12.7 in 02/2012.  He denies any upper GI symptoms including heartburn, indigestion, nausea, vomiting, dysphagia, odynophagia or anorexia.  He has been trying to lose weight by cutting portion size.  He reports difficulty with ambulating due to a "bad back".  He was taking tylenol for pain, denies NSAIDS.   Denies constipation, diarrhea.  Intentional 15# weight loss in past few months.  Last colonoscopy was normal by Dr Jacobs in March 2009.  He gives hx of colon polyps prior to that colonoscopy.  Past Medical History  Diagnosis Date  . HYPERTENSION 01/22/2009  . 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 BPH (benign prostatic hypertrophy) 04/15/2010  . DEPRESSION 01/22/2009  . HYPERCHOLESTEROLEMIA 01/22/2009  . 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 Morbid obesity   . Heart valve replaced   . Colon polyps     Past Surgical History  Procedure Laterality Date  . Appendectomy    . Aortic valve replacement  July 2006    #20 stentless [place] porcine valve  . Dental surgery  05/2004    Dental extractions  . Heel spur surgery      resection of heel spur  . Colonoscopy  04/24/2007    Jacobs: normal    Current Outpatient Prescriptions  Medication Sig Dispense Refill  . acarbose (PRECOSE) 100 MG tablet Take 100 mg by mouth 3 (three) times daily with meals.        . aspirin 81 MG tablet Take 81 mg by mouth daily.        . citalopram (CELEXA) 20 MG tablet Take 20 mg by mouth daily.        . furosemide (LASIX) 40 MG tablet Take 40 mg by mouth daily.        .  insulin detemir (LEVEMIR) 100 UNIT/ML injection Inject 80 Units into the skin 2 (two) times daily.       . levothyroxine (SYNTHROID, LEVOTHROID) 88 MCG tablet Take 88 mcg by mouth daily.        . lisinopril (PRINIVIL,ZESTRIL) 20 MG tablet Take 20 mg by mouth daily.        . metoprolol tartrate (LOPRESSOR) 25 MG tablet Take 12.5 mg by mouth 2 (two) times daily.       . Multiple Vitamins-Minerals (MENS MULTI VITAMIN & MINERAL PO) Take 1 tablet by mouth daily.        . Omega-3 Fatty Acids (FISH OIL) 1200 MG CAPS Take 1-2 capsules by mouth 2 (two) times daily. 1 capsule by mouth q am, 2 capsules by mouth q pm      . simvastatin (ZOCOR) 40 MG tablet Take 40 mg by mouth at bedtime.        . glyBURIDE (DIABETA) 5 MG tablet Take 5 mg by mouth 2 (two) times daily.      . polyethylene glycol-electrolytes (TRILYTE) 420 G solution Take 4,000 mLs by mouth as directed.  4000 mL  0   No current facility-administered medications for this visit.    Allergies as of 04/02/2012 -   Review Complete 04/02/2012  Allergen Reaction Noted  . Phenothiazines Anaphylaxis 04/15/2010  . Pioglitazone  04/03/2009    Family History  Problem Relation Age of Onset  . Diabetes      History   Social History  . Marital Status: Divorced    Spouse Name: N/A    Number of Children: 1  . Years of Education: N/A   Occupational History  . Truck Driver, retired    Social History Main Topics  . Smoking status: Former Smoker    Quit date: 09/20/1961  . Smokeless tobacco: Not on file  . Alcohol Use: No  . Drug Use: Yes    Special: Marijuana  . Sexually Active: Not on file   Other Topics Concern  . Not on file   Social History Narrative   Lives alone.   Quit smoking approximately 28 years ago.   Long haul truck driver, lives in [place].    Review of Systems: Gen: Denies any fever, chills, sweats, anorexia, fatigue, weakness, malaise, weight loss, and sleep disorder CV: Denies chest pain, angina, palpitations,  syncope, orthopnea, PND, peripheral edema, and claudication. Resp: Denies dyspnea at rest, dyspnea with exercise, cough, sputum, wheezing, coughing up blood, and pleurisy. GI: Denies vomiting blood, jaundice, and fecal incontinence.   Denies dysphagia or odynophagia. GU : Denies urinary burning, blood in urine, urinary frequency, urinary hesitancy, nocturnal urination. MS: See HPI.  Derm: Denies rash, itching, dry skin, hives, moles, warts, or unhealing ulcers.  Psych: Denies depression, anxiety, memory loss, suicidal ideation, hallucinations, paranoia, and confusion. Heme: Denies bruising, bleeding, and enlarged lymph nodes. Neuro:  Denies any headaches, dizziness, paresthesias. Endo:  Denies any problems with DM, thyroid, adrenal function.  Physical Exam: BP 126/78  Pulse 68  Temp(Src) 97.6 F (36.4 C) (Oral)  Ht 6' 1" (1.854 m)  Wt 304 lb 9.6 oz (138.166 kg)  BMI 40.2 kg/m2 No LMP for male patient. General:   Alert,  Well-developed, obese, pleasant and cooperative in NAD Head:  Normocephalic and atraumatic. Eyes:  Sclera clear, no icterus.   Conjunctiva pink. Ears:  Normal auditory acuity. Nose:  No deformity, discharge, or lesions. Mouth:  No deformity or lesions,oropharynx pink & moist. Neck:  Supple; no masses or thyromegaly. Lungs:  Clear throughout to auscultation.   No wheezes, crackles, or rhonchi. No acute distress. Heart:  Regular rate and rhythm; no murmurs, clicks, rubs,  or gallops. Abdomen:  Protuberant.  Normal bowel sounds.  No bruits.  Soft, non-tender and non-distended without masses, hepatosplenomegaly or hernias noted.  No guarding or rebound tenderness.  Exam limited due to body habitus. Rectal:  Deferred.  Msk:  Symmetrical without gross deformities. Normal posture. Pulses:  Normal pulses noted. Extremities:  No edema. Neurologic:  Alert and oriented x4;  grossly normal neurologically. Skin:  Intact without significant lesions or rashes. Lymph Nodes:  No  significant cervical adenopathy. Psych:  Alert and cooperative. Normal mood and affect.  

## 2012-04-05 NOTE — Op Note (Signed)
[facility] [address] [place], [address]   COLONOSCOPY PROCEDURE REPORT  PATIENT: [name], [name]  MR#:         [id number] BIRTHDATE: 1945/09/20 , 66  yrs. old GENDER: Male ENDOSCOPIST: R.  Roetta Sessions, MD [facility] REFERRED BY:  Helene Kelp, PA PROCEDURE DATE:  04/05/2012 PROCEDURE:     Diagnostic colonoscopy  INDICATIONS: Hemoccult-positive stool; distant history of colonic polyps  INFORMED CONSENT:  The risks, benefits, alternatives and imponderables including but not limited to bleeding, perforation as well as the possibility of a missed lesion have been reviewed.  The potential for biopsy, lesion removal, etc. have also been discussed.  Questions have been answered.  All parties agreeable. Please see the history and physical in the medical record for more information.  MEDICATIONS: Versed 4 mg IV and Demerol 75 mg IV in divided doses. Zofran 4 mg IV  DESCRIPTION OF PROCEDURE:  After a digital rectal exam was performed, the EC-[id number] ([id number])  colonoscope was advanced from the anus through the rectum and colon to the area of the cecum, ileocecal valve and appendiceal orifice.  The cecum was deeply intubated.  These structures were well-seen and photographed for the record.  From the level of the cecum and ileocecal valve, the scope was slowly and cautiously withdrawn.  The mucosal surfaces were carefully surveyed utilizing scope tip deflection to facilitate fold flattening as needed.  The scope was pulled down into the rectum where a thorough examination including retroflexion was performed.    FINDINGS:  Adequate preparation. Normal rectum. Somewhat elongated and redundant, but otherwise normal-appearing, colon.  THERAPEUTIC / DIAGNOSTIC MANEUVERS PERFORMED:  None  COMPLICATIONS: None  CECAL WITHDRAWAL TIME:  11 minutes  IMPRESSION:  Normal rectum and colon  RECOMMENDATIONS: Repeat colonoscopy in 5 years. See EGD  report   _______________________________ eSigned:  R. Roetta Sessions, MD FACP [facility] 04/05/2012 12:01 PM   CC:

## 2012-04-08 ENCOUNTER — Encounter: Payer: Self-pay | Admitting: Internal Medicine

## 2012-04-09 ENCOUNTER — Telehealth: Payer: Self-pay

## 2012-04-09 ENCOUNTER — Encounter: Payer: Self-pay | Admitting: *Deleted

## 2012-04-09 DIAGNOSIS — E1159 Type 2 diabetes mellitus with other circulatory complications: Secondary | ICD-10-CM | POA: Diagnosis not present

## 2012-04-09 DIAGNOSIS — D649 Anemia, unspecified: Secondary | ICD-10-CM

## 2012-04-09 DIAGNOSIS — K921 Melena: Secondary | ICD-10-CM

## 2012-04-09 DIAGNOSIS — E119 Type 2 diabetes mellitus without complications: Secondary | ICD-10-CM | POA: Diagnosis not present

## 2012-04-09 DIAGNOSIS — E1149 Type 2 diabetes mellitus with other diabetic neurological complication: Secondary | ICD-10-CM | POA: Diagnosis not present

## 2012-04-09 NOTE — Telephone Encounter (Signed)
Letter from: Corbin Ade   Reason for Letter: Results Review   Send letter to patient.  Send copy of letter with path to referring provider and PCP.  Raynelle Fanning, let do HP serologies to double check his status

## 2012-04-09 NOTE — Telephone Encounter (Signed)
Tried to call pt- NA 

## 2012-04-09 NOTE — Telephone Encounter (Signed)
Path and letter faxed to PCP, letter mailed to pt

## 2012-04-10 ENCOUNTER — Telehealth: Payer: Self-pay | Admitting: Physician Assistant

## 2012-04-10 ENCOUNTER — Other Ambulatory Visit: Payer: Self-pay

## 2012-04-10 DIAGNOSIS — K921 Melena: Secondary | ICD-10-CM

## 2012-04-10 DIAGNOSIS — D649 Anemia, unspecified: Secondary | ICD-10-CM

## 2012-04-10 NOTE — Telephone Encounter (Signed)
PLEASE CALL ABOUT HIS MEDICINE. ITS A NEW MED.  HE HAS BEEN TAKING TRADJENTA SAMPLES. WHAT TO DO? CONTINUE? RX OR SAMPLES?

## 2012-04-10 NOTE — Telephone Encounter (Signed)
Pt is aware. He has an appointment at [facility] on 04/17/12 and would like to have it done there. I have faxed the lab order to their office.

## 2012-04-11 ENCOUNTER — Telehealth: Payer: Self-pay | Admitting: Physician Assistant

## 2012-04-11 ENCOUNTER — Encounter (HOSPITAL_COMMUNITY): Payer: Self-pay | Admitting: Internal Medicine

## 2012-04-11 ENCOUNTER — Telehealth: Payer: Self-pay | Admitting: *Deleted

## 2012-04-11 MED ORDER — DEXLANSOPRAZOLE 60 MG PO CPDR: 60.0000 mg | DELAYED_RELEASE_CAPSULE | Freq: Every day | ORAL | Status: DC

## 2012-04-11 NOTE — Telephone Encounter (Signed)
[name] called today stating that he has the "GI's".  I asked him to tell me what that was, and he said he has diarrhea from the medication that Dr Jena Gauss prescribed him. He would like a call back. Thank you.

## 2012-04-11 NOTE — Telephone Encounter (Signed)
Spoke with pt- he has had diarrhea ever since starting the protonix. He is requesting a new ppi called in to [facility].

## 2012-04-11 NOTE — Telephone Encounter (Signed)
Returning call about what diabetes medication [name] wants him to start taking

## 2012-04-11 NOTE — Telephone Encounter (Signed)
I sent in Dexilant. nexium interacted with his meds.

## 2012-04-12 ENCOUNTER — Telehealth: Payer: Self-pay | Admitting: Physician Assistant

## 2012-04-12 ENCOUNTER — Telehealth: Payer: Self-pay | Admitting: Pharmacist

## 2012-04-12 MED ORDER — LANSOPRAZOLE 30 MG PO CPDR: 30.0000 mg | DELAYED_RELEASE_CAPSULE | Freq: Every day | ORAL | Status: DC

## 2012-04-12 NOTE — Telephone Encounter (Signed)
Spoke with pt.  He was given samples for Tradgenta 5mg  by Helene Kelp, PAC on 02/07/214.  He is out of samples and next visit is not until 04/17/2012.   I wrote rx - sent rx along with 30 day free coupon to [facility] by fax.

## 2012-04-12 NOTE — Telephone Encounter (Signed)
Taken care of by [employer].  See previous note.

## 2012-04-12 NOTE — Telephone Encounter (Signed)
Returned call but no answer.  No machine to leave message on.

## 2012-04-12 NOTE — Telephone Encounter (Signed)
Pt called- his copay is $95.00 for the dexilant. Pt cannot afford this and he cannot use a copay card because he has medicare. Pt has not tried prevacid and wants to know if this can be sent in for him? Please advise  He said to make sure you knew that he was on a new DM medication called tradgenta.

## 2012-04-12 NOTE — Telephone Encounter (Signed)
Yes. I just changed it to Prevacid.

## 2012-04-12 NOTE — Telephone Encounter (Signed)
Wants to talked to tammy he was a little upset with new system

## 2012-04-12 NOTE — Addendum Note (Signed)
Addended by: Nira Retort on: 04/12/2012 03:48 PM   Modules accepted: Orders

## 2012-04-12 NOTE — Telephone Encounter (Signed)
Has been calling every day for three days but no one calls back. Needs to know about his sugar pill.

## 2012-04-16 ENCOUNTER — Encounter: Payer: Self-pay | Admitting: Physician Assistant

## 2012-04-16 ENCOUNTER — Ambulatory Visit (INDEPENDENT_AMBULATORY_CARE_PROVIDER_SITE_OTHER): Payer: Medicare Other | Admitting: Physician Assistant

## 2012-04-16 VITALS — BP 128/79 | HR 79 | Temp 96.7°F | Ht 73.0 in | Wt 306.6 lb

## 2012-04-16 DIAGNOSIS — N184 Chronic kidney disease, stage 4 (severe): Secondary | ICD-10-CM | POA: Diagnosis not present

## 2012-04-16 DIAGNOSIS — E119 Type 2 diabetes mellitus without complications: Secondary | ICD-10-CM

## 2012-04-16 DIAGNOSIS — E039 Hypothyroidism, unspecified: Secondary | ICD-10-CM

## 2012-04-16 DIAGNOSIS — F32A Depression, unspecified: Secondary | ICD-10-CM

## 2012-04-16 DIAGNOSIS — I10 Essential (primary) hypertension: Secondary | ICD-10-CM

## 2012-04-16 DIAGNOSIS — F329 Major depressive disorder, single episode, unspecified: Secondary | ICD-10-CM

## 2012-04-16 DIAGNOSIS — E785 Hyperlipidemia, unspecified: Secondary | ICD-10-CM

## 2012-04-16 DIAGNOSIS — K219 Gastro-esophageal reflux disease without esophagitis: Secondary | ICD-10-CM

## 2012-04-16 LAB — COMPREHENSIVE METABOLIC PANEL
AST: 25 U/L (ref 0–37)
Alkaline Phosphatase: 59 U/L (ref 39–117)
BUN: 19 mg/dL (ref 6–23)
Calcium: 9.1 mg/dL (ref 8.4–10.5)
Creat: 1.18 mg/dL (ref 0.50–1.35)

## 2012-04-16 LAB — POCT CBC
Lymph, poc: 1.9 (ref 0.6–3.4)
MCH, POC: 31.5 pg — AB (ref 27–31.2)
MCHC: 32.7 g/dL (ref 31.8–35.4)
MCV: 96.3 fL (ref 80–97)
Platelet Count, POC: 150 10*3/uL (ref 142–424)
RDW, POC: 15.1 %
WBC: 8.7 10*3/uL (ref 4.6–10.2)

## 2012-04-16 LAB — POCT UA - MICROALBUMIN

## 2012-04-16 LAB — POCT GLYCOSYLATED HEMOGLOBIN (HGB A1C): Hemoglobin A1C: 8

## 2012-04-16 LAB — MICROALBUMIN, URINE: Microalb, Ur: 1.5 mg/dL (ref 0.00–1.89)

## 2012-04-16 MED ORDER — GLYBURIDE 5 MG PO TABS: 5.0000 mg | ORAL_TABLET | Freq: Two times a day (BID) | ORAL | Status: DC

## 2012-04-16 MED ORDER — SIMVASTATIN 40 MG PO TABS: 40.0000 mg | ORAL_TABLET | Freq: Every day | ORAL | Status: DC

## 2012-04-16 MED ORDER — METOPROLOL TARTRATE 25 MG PO TABS: 12.5000 mg | ORAL_TABLET | Freq: Two times a day (BID) | ORAL | Status: DC

## 2012-04-16 MED ORDER — FUROSEMIDE 40 MG PO TABS: 40.0000 mg | ORAL_TABLET | Freq: Every day | ORAL | Status: DC

## 2012-04-16 MED ORDER — CITALOPRAM HYDROBROMIDE 20 MG PO TABS: 20.0000 mg | ORAL_TABLET | Freq: Every day | ORAL | Status: DC

## 2012-04-16 MED ORDER — LEVOTHYROXINE SODIUM 88 MCG PO TABS: 88.0000 ug | ORAL_TABLET | Freq: Every day | ORAL | Status: DC

## 2012-04-16 MED ORDER — LISINOPRIL 20 MG PO TABS: 20.0000 mg | ORAL_TABLET | Freq: Every day | ORAL | Status: DC

## 2012-04-16 MED ORDER — ACARBOSE 100 MG PO TABS: 100.0000 mg | ORAL_TABLET | Freq: Three times a day (TID) | ORAL | Status: DC

## 2012-04-16 MED ORDER — DEXLANSOPRAZOLE 60 MG PO CPDR: 60.0000 mg | DELAYED_RELEASE_CAPSULE | Freq: Every day | ORAL | Status: DC

## 2012-04-16 MED ORDER — INSULIN DETEMIR 100 UNIT/ML ~~LOC~~ SOLN: 80.0000 [IU] | Freq: Two times a day (BID) | SUBCUTANEOUS | Status: DC

## 2012-04-16 NOTE — Progress Notes (Signed)
Subjective:    Patient ID: [name], male    DOB: [birth year], 67 y.o.   MRN: [id number]  HPI Regularly scheduled follow up on diabetes, HTN, hyperlipidemia,  Recent colonoscopy showed a slight ulceration, return in 5 years     Review of Systems  All other systems reviewed and are negative.       Objective:   Physical Exam  Constitutional: He is oriented to person, place, and time. He appears well-developed and well-nourished.  Morbid, truncal obesity  HENT:  Head: Normocephalic and atraumatic.  Right Ear: External ear normal.  Left Ear: External ear normal.  Nose: Nose normal.  Mouth/Throat: Oropharynx is clear and moist.  Eyes: Conjunctivae and EOM are normal. Pupils are equal, round, and reactive to light.  Neck: Normal range of motion. Neck supple. No thyromegaly present.  Cardiovascular: Normal rate, regular rhythm, normal heart sounds and intact distal pulses.   No murmur heard. Pulmonary/Chest: Effort normal and breath sounds normal.  Abdominal: Soft. Bowel sounds are normal.  Musculoskeletal: Normal range of motion.  Neurological: He is alert and oriented to person, place, and time.  Skin: Skin is warm and dry.  Psychiatric: He has a normal mood and affect. His behavior is normal. Judgment and thought content normal.          Assessment & Plan:  Diabetes HTN Hyperlipidemia GERD Obesity CKD depresiion Orders Placed This Encounter  Procedures  . Comprehensive metabolic panel  . TSH  . NMR, lipoprofile  . Microalbumin, urine  . POCT CBC  . POCT glycosylated hemoglobin (Hb A1C)   Meds ordered this encounter  Medications  . acarbose (PRECOSE) 100 MG tablet    Sig: Take 1 tablet (100 mg total) by mouth 3 (three) times daily with meals.    Dispense:  90 tablet    Refill:  5    Order Specific Question:  Supervising Provider    Answer:  Ernestina Penna [[id number]]  . citalopram (CELEXA) 20 MG tablet    Sig: Take 1 tablet (20 mg total) by mouth  daily.    Dispense:  30 tablet    Refill:  5    Order Specific Question:  Supervising Provider    Answer:  Ernestina Penna [[id number]]  . dexlansoprazole (DEXILANT) 60 MG capsule    Sig: Take 1 capsule (60 mg total) by mouth daily.    Dispense:  30 capsule    Refill:  5    Order Specific Question:  Supervising Provider    Answer:  Corbin Ade [[id number]]  . furosemide (LASIX) 40 MG tablet    Sig: Take 1 tablet (40 mg total) by mouth daily.    Dispense:  30 tablet    Refill:  5    Order Specific Question:  Supervising Provider    Answer:  Ernestina Penna [[id number]]  . glyBURIDE (DIABETA) 5 MG tablet    Sig: Take 1 tablet (5 mg total) by mouth 2 (two) times daily.    Dispense:  60 tablet    Refill:  5    Order Specific Question:  Supervising Provider    Answer:  Ernestina Penna [[id number]]  . insulin detemir (LEVEMIR) 100 UNIT/ML injection    Sig: Inject 0.8 mLs (80 Units total) into the skin 2 (two) times daily.    Dispense:  10 mL    Refill:  5    Order Specific Question:  Supervising Provider    Answer:  Ernestina Penna [[id number]]  .  levothyroxine (SYNTHROID, LEVOTHROID) 88 MCG tablet    Sig: Take 1 tablet (88 mcg total) by mouth daily.    Dispense:  30 tablet    Refill:  5    Order Specific Question:  Supervising Provider    Answer:  Ernestina Penna [[id number]]  . lisinopril (PRINIVIL,ZESTRIL) 20 MG tablet    Sig: Take 1 tablet (20 mg total) by mouth daily.    Dispense:  30 tablet    Refill:  5    Order Specific Question:  Supervising Provider    Answer:  Ernestina Penna [[id number]]  . metoprolol tartrate (LOPRESSOR) 25 MG tablet    Sig: Take 0.5 tablets (12.5 mg total) by mouth 2 (two) times daily.    Dispense:  60 tablet    Refill:  5    Order Specific Question:  Supervising Provider    Answer:  Ernestina Penna [[id number]]  . simvastatin (ZOCOR) 40 MG tablet    Sig: Take 1 tablet (40 mg total) by mouth at bedtime.    Dispense:  30 tablet    Refill:  5    Order Specific Question:  Supervising  Provider    Answer:  Ernestina Penna [phone]

## 2012-04-17 ENCOUNTER — Telehealth: Payer: Self-pay | Admitting: Physician Assistant

## 2012-04-17 NOTE — Progress Notes (Signed)
Quick Note:  Labs were within normal limits ______ 

## 2012-04-17 NOTE — Telephone Encounter (Signed)
Told patient to continue taking Tradjenta and to f/u as planned.  Patient understood plan.

## 2012-04-17 NOTE — Telephone Encounter (Signed)
Calla bout labs.

## 2012-04-17 NOTE — Telephone Encounter (Signed)
Message copied by Gwenith Daily on Tue Apr 17, 2012  5:12 PM ------      Message from: Horald Pollen      Created: Tue Apr 17, 2012  4:42 PM       A1C 8.0; continue to use Tradjenta ------

## 2012-05-01 DIAGNOSIS — M5137 Other intervertebral disc degeneration, lumbosacral region: Secondary | ICD-10-CM | POA: Diagnosis not present

## 2012-05-02 ENCOUNTER — Encounter: Payer: Self-pay | Admitting: Cardiology

## 2012-05-02 ENCOUNTER — Ambulatory Visit (INDEPENDENT_AMBULATORY_CARE_PROVIDER_SITE_OTHER): Payer: Medicare Other | Admitting: Cardiology

## 2012-05-02 DIAGNOSIS — I6529 Occlusion and stenosis of unspecified carotid artery: Secondary | ICD-10-CM

## 2012-05-02 DIAGNOSIS — I658 Occlusion and stenosis of other precerebral arteries: Secondary | ICD-10-CM | POA: Diagnosis not present

## 2012-05-02 DIAGNOSIS — I6523 Occlusion and stenosis of bilateral carotid arteries: Secondary | ICD-10-CM

## 2012-05-02 NOTE — Patient Instructions (Addendum)
The current medical regimen is effective;  continue present plan and medications.  Your physician has requested that you have a carotid duplex. This test is an ultrasound of the carotid arteries in your neck. It looks at blood flow through these arteries that supply the brain with blood. Allow one hour for this exam. There are no restrictions or special instructions.  Follow up in 1 year with Dr Hochrein.  You will receive a letter in the mail 2 months before you are due.  Please call us when you receive this letter to schedule your follow up appointment.  

## 2012-05-02 NOTE — Progress Notes (Signed)
HPI The patient presents for yearly followup. Since I last saw him he has had no new cardiovascular complaints. He denies any shortness of breath, PND or orthopnea. He's not been noticing any palpitations, presyncope or syncope. He has had no weight gain or edema. He is somewhat limited by back pain.    Allergies  Allergen Reactions  . Phenothiazines Anaphylaxis  . Pioglitazone Swelling    Current Outpatient Prescriptions  Medication Sig Dispense Refill  . acarbose (PRECOSE) 100 MG tablet Take 1 tablet (100 mg total) by mouth 3 (three) times daily with meals.  90 tablet  5  . aspirin 81 MG tablet Take 81 mg by mouth daily.        . citalopram (CELEXA) 20 MG tablet Take 1 tablet (20 mg total) by mouth daily.  30 tablet  5  . dexlansoprazole (DEXILANT) 60 MG capsule Take 1 capsule (60 mg total) by mouth daily.  30 capsule  5  . furosemide (LASIX) 40 MG tablet Take 1 tablet (40 mg total) by mouth daily.  30 tablet  5  . glyBURIDE (DIABETA) 5 MG tablet Take 1 tablet (5 mg total) by mouth 2 (two) times daily.  60 tablet  5  . insulin detemir (LEVEMIR) 100 UNIT/ML injection Inject 0.8 mLs (80 Units total) into the skin 2 (two) times daily.  10 mL  5  . lansoprazole (PREVACID) 30 MG capsule Take 1 capsule (30 mg total) by mouth daily.  90 capsule  3  . levothyroxine (SYNTHROID, LEVOTHROID) 88 MCG tablet Take 1 tablet (88 mcg total) by mouth daily.  30 tablet  5  . lisinopril (PRINIVIL,ZESTRIL) 20 MG tablet Take 1 tablet (20 mg total) by mouth daily.  30 tablet  5  . metoprolol tartrate (LOPRESSOR) 25 MG tablet Take 0.5 tablets (12.5 mg total) by mouth 2 (two) times daily.  60 tablet  5  . Multiple Vitamins-Minerals (MENS MULTI VITAMIN & MINERAL PO) Take 1 tablet by mouth daily.        . NON FORMULARY trejenta 5 mg daily      . Omega-3 Fatty Acids (FISH OIL) 1200 MG CAPS Take 1-2 capsules by mouth 2 (two) times daily. 1 capsule by mouth q am, 2 capsules by mouth q pm      . polyethylene  glycol-electrolytes (TRILYTE) 420 G solution Take 4,000 mLs by mouth as directed.  4000 mL  0  . simvastatin (ZOCOR) 40 MG tablet Take 1 tablet (40 mg total) by mouth at bedtime.  30 tablet  5   No current facility-administered medications for this visit.    Past Medical History  Diagnosis Date  . HYPERTENSION 01/22/2009  . 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 BPH (benign prostatic hypertrophy) 04/15/2010  . DEPRESSION 01/22/2009  . HYPERCHOLESTEROLEMIA 01/22/2009  . 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 Morbid obesity   . Heart valve replaced   . Colon polyps   . Ulcer     Past Surgical History  Procedure Laterality Date  . Appendectomy    . Aortic valve replacement  July 2006    #20 stentless [place] porcine valve  . Dental surgery  05/2004    Dental extractions  . Heel spur surgery      resection of heel spur  . Colonoscopy  04/24/2007    Christella Hartigan: normal  . Tonsillectomy    . Bilateral cataract surg    . Colonoscopy with esophagogastroduodenoscopy (egd) N/A 04/05/2012    Procedure: COLONOSCOPY WITH ESOPHAGOGASTRODUODENOSCOPY (EGD);  Surgeon: Molly Maduro  Sonnie Alamo, MD;  Location: [facility];  Service: Endoscopy;  Laterality: N/A;  10;15    ROS:   As stated in the HPI and negative for all other systems.   PHYSICAL EXAM BP 128/73  Pulse 65  Ht 6\' 1"  (1.854 m)  Wt 307 lb (139.254 kg)  BMI 40.51 kg/m2 GENERAL:  Well appearing NECK:  No jugular venous distention, waveform within normal limits, carotid upstroke brisk and symmetric, no bruits, no thyromegaly LUNGS:  Clear to auscultation bilaterally CHEST:  Well healed sternotomy scar. HEART:  PMI not displaced or sustained,S1 and S2 within normal limits, no S3, no S4 no clicks, no rubs, systolic murmur mid peaking and  radiating into the carotids  ABD:  Flat, positive bowel sounds normal in frequency in pitch, no bruits, no rebound, no guarding, no midline pulsatile mass, no hepatomegaly, no splenomegaly, obese EXT:  2 plus pulses  throughout, no edema, no cyanosis no clubbing   EKG:  Sinus rhythm, rate 65,  inclomplete RBBB, LAFB, no acute ST T wave changes.  05/02/2012  ASSESSMENT AND PLAN  AORTIC VALVE REPLACEMENT, HX OF - He had stable valve replacement on the last echo in 2011. No change in therapy is indicated.    HYPERTENSION -  The blood pressure is at target. No change in medications is indicated. We will continue with therapeutic lifestyle changes (TLC).   CAROTID OCCLUSIVE DISEASE -  He had mild bilateral plaquing and reviewed the most recent Doppler's. He is overdue for follow up and I will arrange this.   Obesity -  The patient understands the need to lose weight with diet and exercise.

## 2012-05-09 ENCOUNTER — Telehealth: Payer: Self-pay

## 2012-05-09 ENCOUNTER — Other Ambulatory Visit: Payer: Self-pay

## 2012-05-09 DIAGNOSIS — K921 Melena: Secondary | ICD-10-CM

## 2012-05-09 DIAGNOSIS — D649 Anemia, unspecified: Secondary | ICD-10-CM

## 2012-05-09 NOTE — Telephone Encounter (Signed)
Pt would like to know how long he has to take the PPI. He said that he does not have heartburn and he was fine before he started the PPI. Please advise. He would like to talk to you but I told him you was seeing patients.

## 2012-05-09 NOTE — Telephone Encounter (Signed)
He had inflammation in the first part of his intestines, the duodenum. Biopsies were negative for H.pylori; however, we need to check his serology status. We had sent orders to do this to him, but it looks like it wasn't completed.   I recommend continuing PPI. Even though he may not have typical GERD symptoms, there was evidence of inflammation in his upper GI tract.  Let's get H.pylori serologies as previously ordered.

## 2012-05-09 NOTE — Telephone Encounter (Signed)
Pt is aware of what AS said. I am going to mail him the order for the blood work. He is going to [facility] to have the blood work done.

## 2012-05-09 NOTE — Telephone Encounter (Signed)
Tried to call with no answer  

## 2012-05-14 DIAGNOSIS — K921 Melena: Secondary | ICD-10-CM | POA: Diagnosis not present

## 2012-05-14 DIAGNOSIS — D649 Anemia, unspecified: Secondary | ICD-10-CM | POA: Diagnosis not present

## 2012-05-16 NOTE — Progress Notes (Signed)
Pt aware of what AS said  

## 2012-05-16 NOTE — Progress Notes (Signed)
Negative H.pylori serology.  If no reflux symptoms, may trial without PPI. AVOID ALL NSAIDS.  Resume PPI if any issues.

## 2012-05-23 ENCOUNTER — Encounter: Payer: Self-pay | Admitting: Gastroenterology

## 2012-05-31 ENCOUNTER — Other Ambulatory Visit: Payer: Self-pay

## 2012-05-31 MED ORDER — GLUCOSE BLOOD VI STRP: ORAL_STRIP | Status: DC

## 2012-06-01 ENCOUNTER — Telehealth: Payer: Self-pay | Admitting: Family Medicine

## 2012-06-04 NOTE — Telephone Encounter (Signed)
Please advise 

## 2012-06-05 NOTE — Telephone Encounter (Signed)
done

## 2012-06-06 NOTE — Telephone Encounter (Signed)
Pt was notified on 04/12/12 by Elvin So Phd and given an rx for Tradgenta 5mg 

## 2012-06-14 ENCOUNTER — Telehealth: Payer: Self-pay | Admitting: *Deleted

## 2012-06-15 NOTE — Telephone Encounter (Signed)
Samples up front patient aware  

## 2012-06-19 ENCOUNTER — Telehealth: Payer: Self-pay | Admitting: Pharmacist

## 2012-06-20 ENCOUNTER — Telehealth: Payer: Self-pay | Admitting: Family Medicine

## 2012-06-20 MED ORDER — LINAGLIPTIN 5 MG PO TABS: 5.0000 mg | ORAL_TABLET | Freq: Every day | ORAL | Status: DC

## 2012-06-20 NOTE — Telephone Encounter (Signed)
Patient requesting samples of Tradjenta 5mg  tablets. Needs to establish care with new provider since [name] is no longer at [facility]. Left #56 samples at front desk and made appt with Dr. Mod[place] [name] for 08/02/12

## 2012-06-22 NOTE — Telephone Encounter (Signed)
Lmtcb on 06/21/12 and 06/22/12

## 2012-07-02 ENCOUNTER — Encounter (INDEPENDENT_AMBULATORY_CARE_PROVIDER_SITE_OTHER): Payer: Medicare Other

## 2012-07-02 DIAGNOSIS — I6529 Occlusion and stenosis of unspecified carotid artery: Secondary | ICD-10-CM | POA: Diagnosis not present

## 2012-07-02 DIAGNOSIS — E11349 Type 2 diabetes mellitus with severe nonproliferative diabetic retinopathy without macular edema: Secondary | ICD-10-CM | POA: Diagnosis not present

## 2012-07-02 DIAGNOSIS — I6523 Occlusion and stenosis of bilateral carotid arteries: Secondary | ICD-10-CM

## 2012-07-02 DIAGNOSIS — E1139 Type 2 diabetes mellitus with other diabetic ophthalmic complication: Secondary | ICD-10-CM | POA: Diagnosis not present

## 2012-07-02 DIAGNOSIS — E11339 Type 2 diabetes mellitus with moderate nonproliferative diabetic retinopathy without macular edema: Secondary | ICD-10-CM | POA: Diagnosis not present

## 2012-07-06 ENCOUNTER — Telehealth: Payer: Self-pay | Admitting: Family Medicine

## 2012-07-06 NOTE — Telephone Encounter (Signed)
Patient states that he will wait and see how things turn out and will call us back if he gets worse

## 2012-07-12 ENCOUNTER — Other Ambulatory Visit: Payer: Self-pay | Admitting: *Deleted

## 2012-07-12 DIAGNOSIS — E0789 Other specified disorders of thyroid: Secondary | ICD-10-CM

## 2012-07-30 ENCOUNTER — Ambulatory Visit (HOSPITAL_COMMUNITY)
Admission: RE | Admit: 2012-07-30 | Discharge: 2012-07-30 | Disposition: A | Discharge status: Home / Self Care | Payer: Medicare Other | Source: Ambulatory Visit | Attending: Cardiology | Admitting: Cardiology

## 2012-07-30 DIAGNOSIS — E041 Nontoxic single thyroid nodule: Secondary | ICD-10-CM | POA: Diagnosis not present

## 2012-07-30 DIAGNOSIS — E0789 Other specified disorders of thyroid: Secondary | ICD-10-CM

## 2012-07-30 DIAGNOSIS — E042 Nontoxic multinodular goiter: Secondary | ICD-10-CM | POA: Diagnosis not present

## 2012-07-31 DIAGNOSIS — E1159 Type 2 diabetes mellitus with other circulatory complications: Secondary | ICD-10-CM | POA: Diagnosis not present

## 2012-07-31 DIAGNOSIS — E1149 Type 2 diabetes mellitus with other diabetic neurological complication: Secondary | ICD-10-CM | POA: Diagnosis not present

## 2012-07-31 DIAGNOSIS — E119 Type 2 diabetes mellitus without complications: Secondary | ICD-10-CM | POA: Diagnosis not present

## 2012-08-02 ENCOUNTER — Encounter: Payer: Self-pay | Admitting: Family Medicine

## 2012-08-02 ENCOUNTER — Ambulatory Visit (INDEPENDENT_AMBULATORY_CARE_PROVIDER_SITE_OTHER): Payer: Medicare Other | Admitting: Family Medicine

## 2012-08-02 VITALS — BP 92/55 | HR 74 | Temp 97.8°F | Wt 304.6 lb

## 2012-08-02 DIAGNOSIS — R011 Cardiac murmur, unspecified: Secondary | ICD-10-CM

## 2012-08-02 DIAGNOSIS — N410 Acute prostatitis: Secondary | ICD-10-CM

## 2012-08-02 DIAGNOSIS — R300 Dysuria: Secondary | ICD-10-CM | POA: Diagnosis not present

## 2012-08-02 DIAGNOSIS — E042 Nontoxic multinodular goiter: Secondary | ICD-10-CM | POA: Diagnosis not present

## 2012-08-02 DIAGNOSIS — I10 Essential (primary) hypertension: Secondary | ICD-10-CM

## 2012-08-02 DIAGNOSIS — I6529 Occlusion and stenosis of unspecified carotid artery: Secondary | ICD-10-CM | POA: Diagnosis not present

## 2012-08-02 DIAGNOSIS — R635 Abnormal weight gain: Secondary | ICD-10-CM | POA: Diagnosis not present

## 2012-08-02 DIAGNOSIS — E669 Obesity, unspecified: Secondary | ICD-10-CM

## 2012-08-02 DIAGNOSIS — E109 Type 1 diabetes mellitus without complications: Secondary | ICD-10-CM | POA: Diagnosis not present

## 2012-08-02 DIAGNOSIS — IMO0001 Reserved for inherently not codable concepts without codable children: Secondary | ICD-10-CM

## 2012-08-02 DIAGNOSIS — D649 Anemia, unspecified: Secondary | ICD-10-CM

## 2012-08-02 DIAGNOSIS — Z954 Presence of other heart-valve replacement: Secondary | ICD-10-CM

## 2012-08-02 DIAGNOSIS — E7800 Pure hypercholesterolemia, unspecified: Secondary | ICD-10-CM

## 2012-08-02 LAB — POCT UA - MICROSCOPIC ONLY
Casts, Ur, LPF, POC: NEGATIVE
Crystals, Ur, HPF, POC: NEGATIVE
Mucus, UA: NEGATIVE
Yeast, UA: NEGATIVE

## 2012-08-02 LAB — COMPLETE METABOLIC PANEL WITH GFR
ALT: 26 U/L (ref 0–53)
AST: 21 U/L (ref 0–37)
Albumin: 3.8 g/dL (ref 3.5–5.2)
Alkaline Phosphatase: 53 U/L (ref 39–117)
BUN: 18 mg/dL (ref 6–23)
CO2: 26 mEq/L (ref 19–32)
Calcium: 9.4 mg/dL (ref 8.4–10.5)
Chloride: 101 mEq/L (ref 96–112)
Creat: 1.16 mg/dL (ref 0.50–1.35)
GFR, Est African American: 74 mL/min
GFR, Est Non African American: 64 mL/min
Glucose, Bld: 216 mg/dL — ABNORMAL HIGH (ref 70–99)
Potassium: 5 mEq/L (ref 3.5–5.3)
Sodium: 135 mEq/L (ref 135–145)
Total Bilirubin: 0.5 mg/dL (ref 0.3–1.2)
Total Protein: 6.9 g/dL (ref 6.0–8.3)

## 2012-08-02 LAB — POCT URINALYSIS DIPSTICK
Bilirubin, UA: NEGATIVE
Glucose, UA: 250
Ketones, UA: NEGATIVE
Nitrite, UA: POSITIVE
Protein, UA: 200
Spec Grav, UA: 1.01
Urobilinogen, UA: NEGATIVE
pH, UA: 6.5

## 2012-08-02 LAB — TSH: TSH: 2.985 u[IU]/mL (ref 0.350–4.500)

## 2012-08-02 LAB — POCT GLYCOSYLATED HEMOGLOBIN (HGB A1C): Hemoglobin A1C: 8

## 2012-08-02 MED ORDER — CIPROFLOXACIN HCL 500 MG PO TABS: 500.0000 mg | ORAL_TABLET | Freq: Two times a day (BID) | ORAL | Status: DC

## 2012-08-02 NOTE — Patient Instructions (Addendum)
      Dr Woodroe Mode Recommendations  Diet and Exercise discussed with patient.  For nutrition information, I recommend books:  1).Eat to Live by Dr Monico Hoar. 2).Prevent and Reverse Heart Disease by Dr Suzzette Righter. 3) Dr Katherina Right Book:  Program to Reverse Diabetes  Exercise recommendations are:  If unable to walk, then the patient can exercise in a chair 3 times a day. By flapping arms like a bird gently and raising legs outwards to the front.  If ambulatory, the patient can go for walks for 30 minutes 3 times a week. Then increase the intensity and duration as tolerated.  Goal is to try to attain exercise frequency to 5 times a week.  If applicable: Best to perform resistance exercises (machines or weights) 2 days a week and cardio type exercises 3 days per week.  Reduce your lisinopril to 10 mg daily.  Check your sugars closely because the antibiotic can cause your sugar to go low. May need to reduce the insulinm.  Return in 2 weeks to recheck Blood pressure and urine.

## 2012-08-02 NOTE — Progress Notes (Signed)
Patient ID: [name], male   DOB: 05/16/1945, 67 y.o.   MRN: [id number] SUBJECTIVE: CC: Chief Complaint  Patient presents with  . Follow-up    3 month ck up  c/o burning with urination    HPI: Patient is here for follow up of Diabetes Mellitus/Aortic valve  Replacement/hypertension/hypercholesterolemia: Symptoms of DM: Denies Nocturia ,Denies Urinary Frequency , denies Blurred vision ,deniesDizziness,denies.Dysuria,denies paresthesias, denies extremity pain or ulcers.Marland Kitchendenies chest pain. has had an annual eye exam. And cataract surgery. do check the feet. Had feet checked yesterda at podiatry Does check CBGs. Average CBG:185 this morning but yesterday 126. Denies episodes of hypoglycemia. Does have an emergency hypoglycemic plan. admits toCompliance with medications.On [place] Denies Problems with medications.  Breakfast: cereal: cherrios or corn flakes, glass of milk Lunch: country style  Steak, green beans and squash Supper: chicken salad sandwich. Or beans ,etc.  On and off dysuria. No blood in the urine, no malodor.urine  Stream is not as  Strong as before.  Past Medical History  Diagnosis Date  . HYPERTENSION 01/22/2009  . 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 BPH (benign prostatic hypertrophy) 04/15/2010  . DEPRESSION 01/22/2009  . HYPERCHOLESTEROLEMIA 01/22/2009  . 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 Morbid obesity   . Heart valve replaced   . Colon polyps   . Ulcer    Past Surgical History  Procedure Laterality Date  . Appendectomy    . Aortic valve replacement  July 2006    #20 stentless [place] porcine valve  . Dental surgery  05/2004    Dental extractions  . Heel spur surgery      resection of heel spur  . Colonoscopy  04/24/2007    Christella Hartigan: normal  . Tonsillectomy    . Bilateral cataract surg    . Colonoscopy with esophagogastroduodenoscopy (egd) N/A 04/05/2012    Procedure: COLONOSCOPY WITH ESOPHAGOGASTRODUODENOSCOPY (EGD);  Surgeon: Corbin Ade, MD;   Location: [facility];  Service: Endoscopy;  Laterality: N/A;  10;15   History   Social History  . Marital Status: Divorced    Spouse Name: N/A    Number of Children: 1  . Years of Education: N/A   Occupational History  . [employer] doctor, retired    Social History Main Topics  . Smoking status: Former Smoker    Quit date: 09/20/1961  . Smokeless tobacco: Not on file  . Alcohol Use: No  . Drug Use: Yes    Special: Marijuana  . Sexually Active: Not on file   Other Topics Concern  . Not on file   Social History Narrative   Lives alone.   Quit smoking approximately 28 years ago.   Long haul truck driver, lives in [place].   Family History  Problem Relation Age of Onset  . Diabetes     Current Outpatient Prescriptions on File Prior to Visit  Medication Sig Dispense Refill  . acarbose (PRECOSE) 100 MG tablet Take 1 tablet (100 mg total) by mouth 3 (three) times daily with meals.  90 tablet  5  . aspirin 81 MG tablet Take 81 mg by mouth daily.        . citalopram (CELEXA) 20 MG tablet Take 1 tablet (20 mg total) by mouth daily.  30 tablet  5  . dexlansoprazole (DEXILANT) 60 MG capsule Take 1 capsule (60 mg total) by mouth daily.  30 capsule  5  . furosemide (LASIX) 40 MG tablet Take 1 tablet (40 mg total) by mouth daily.  30 tablet  5  .  glucose blood test strip Use as instructed  100 each  0  . glyBURIDE (DIABETA) 5 MG tablet Take 1 tablet (5 mg total) by mouth 2 (two) times daily.  60 tablet  5  . insulin detemir (LEVEMIR) 100 UNIT/ML injection Inject 0.8 mLs (80 Units total) into the skin 2 (two) times daily.  10 mL  5  . lansoprazole (PREVACID) 30 MG capsule Take 1 capsule (30 mg total) by mouth daily.  90 capsule  3  . levothyroxine (SYNTHROID, LEVOTHROID) 88 MCG tablet Take 1 tablet (88 mcg total) by mouth daily.  30 tablet  5  . linagliptin (TRADJENTA) 5 MG TABS tablet Take 1 tablet (5 mg total) by mouth daily.  56 tablet  0  . lisinopril (PRINIVIL,ZESTRIL) 20 MG  tablet Take 1 tablet (20 mg total) by mouth daily.  30 tablet  5  . metoprolol tartrate (LOPRESSOR) 25 MG tablet Take 0.5 tablets (12.5 mg total) by mouth 2 (two) times daily.  60 tablet  5  . Multiple Vitamins-Minerals (MENS MULTI VITAMIN & MINERAL PO) Take 1 tablet by mouth daily.        . Omega-3 Fatty Acids (FISH OIL) 1200 MG CAPS Take 1-2 capsules by mouth 2 (two) times daily. 1 capsule by mouth q am, 2 capsules by mouth q pm      . polyethylene glycol-electrolytes (TRILYTE) 420 G solution Take 4,000 mLs by mouth as directed.  4000 mL  0  . simvastatin (ZOCOR) 40 MG tablet Take 1 tablet (40 mg total) by mouth at bedtime.  30 tablet  5   No current facility-administered medications on file prior to visit.   Allergies  Allergen Reactions  . Phenothiazines Anaphylaxis  . Pioglitazone Swelling   Immunization History  Administered Date(s) Administered  . Influenza Whole 09/24/2008   Prior to Admission medications   Medication Sig Start Date End Date Taking? Authorizing Provider  acarbose (PRECOSE) 100 MG tablet Take 1 tablet (100 mg total) by mouth 3 (three) times daily with meals. 04/16/12   Horald Pollen, PA-C  aspirin 81 MG tablet Take 81 mg by mouth daily.      Historical Provider, MD  citalopram (CELEXA) 20 MG tablet Take 1 tablet (20 mg total) by mouth daily. 04/16/12   Horald Pollen, PA-C  dexlansoprazole (DEXILANT) 60 MG capsule Take 1 capsule (60 mg total) by mouth daily. 04/16/12   Horald Pollen, PA-C  furosemide (LASIX) 40 MG tablet Take 1 tablet (40 mg total) by mouth daily. 04/16/12   Horald Pollen, PA-C  glucose blood test strip Use as instructed 05/31/12   Ernestina Penna, MD  glyBURIDE (DIABETA) 5 MG tablet Take 1 tablet (5 mg total) by mouth 2 (two) times daily. 04/16/12   Horald Pollen, PA-C  insulin detemir (LEVEMIR) 100 UNIT/ML injection Inject 0.8 mLs (80 Units total) into the skin 2 (two) times daily. 04/16/12   Horald Pollen, PA-C  lansoprazole (PREVACID) 30 MG capsule  Take 1 capsule (30 mg total) by mouth daily. 04/12/12   Nira Retort, NP  levothyroxine (SYNTHROID, LEVOTHROID) 88 MCG tablet Take 1 tablet (88 mcg total) by mouth daily. 04/16/12   Horald Pollen, PA-C  linagliptin (TRADJENTA) 5 MG TABS tablet Take 1 tablet (5 mg total) by mouth daily. 06/20/12   Tammy Eckard, PHARMD  lisinopril (PRINIVIL,ZESTRIL) 20 MG tablet Take 1 tablet (20 mg total) by mouth daily. 04/16/12   Horald Pollen, PA-C  metoprolol tartrate (  LOPRESSOR) 25 MG tablet Take 0.5 tablets (12.5 mg total) by mouth 2 (two) times daily. 04/16/12   Horald Pollen, PA-C  Multiple Vitamins-Minerals (MENS MULTI VITAMIN & MINERAL PO) Take 1 tablet by mouth daily.      Historical Provider, MD  Omega-3 Fatty Acids (FISH OIL) 1200 MG CAPS Take 1-2 capsules by mouth 2 (two) times daily. 1 capsule by mouth q am, 2 capsules by mouth q pm    Historical Provider, MD  polyethylene glycol-electrolytes (TRILYTE) 420 G solution Take 4,000 mLs by mouth as directed. 04/02/12   Corbin Ade, MD  simvastatin (ZOCOR) 40 MG tablet Take 1 tablet (40 mg total) by mouth at bedtime. 04/16/12   Horald Pollen, PA-C     ROS: As above in the HPI. All other systems are stable or negative.  OBJECTIVE: APPEARANCE:  Patient in no acute distress.The patient appeared well nourished and normally developed. Acyanotic. Waist:50.5 inches VITAL SIGNS:BP 92/55  Pulse 74  Temp(Src) 97.8 F (36.6 C) (Oral)  Wt 304 lb 9.6 oz (138.166 kg)  BMI 40.2 kg/m2 Recheck BP 105/70  WM Obese  SKIN: warm and  Dry Sternotomy scar  HEAD and Neck: without JVD, Head and scalp: normal Eyes:No scleral icterus. Fundi normal, eye movements normal. Ears: Auricle normal, canal normal, Tympanic membranes normal, insufflation normal. Nose: normal Throat: normal Neck & thyroid: normal  CHEST & LUNGS: Chest wall: sternotomy scar Lungs: Clear  CVS: Reveals the PMI to be normally located. Regular rhythm, First and Second Heart sounds are  normal,  2/6 murmur at the Aortic area, rubs or gallops. Peripheral vasculature: Radial pulses: normal Dorsal pedis pulses: reduced Posterior pulses: reduced  ABDOMEN:  Appearance: morbidly obese Benign, no organomegaly, no masses, no Abdominal Aortic enlargement. No Guarding , no rebound. No Bruits. Bowel sounds: normal  RECTAL: N/A GU: N/A  EXTREMETIES: nonedematous. Pedal pulses are reduced  MUSCULOSKELETAL:  Spine: normal Joints: intact  NEUROLOGIC: oriented to time,place and person; nonfocal. Strength is normal Sensory is normal Reflexes are normal Cranial Nerves are normal.  Results for orders placed in visit on 08/02/12  POCT URINALYSIS DIPSTICK      Result Value Range   Color, UA yellow     Clarity, UA cloudy     Glucose, UA 250     Bilirubin, UA neg     Ketones, UA neg     Spec Grav, UA 1.010     Blood, UA mod     pH, UA 6.5     Protein, UA 200     Urobilinogen, UA negative     Nitrite, UA pos     Leukocytes, UA moderate (2+)    POCT UA - MICROSCOPIC ONLY      Result Value Range   WBC, Ur, HPF, POC 75-100     RBC, urine, microscopic 10-15     Bacteria, U Microscopic mod     Mucus, UA neg     Epithelial cells, urine per micros occ     Crystals, Ur, HPF, POC neg     Casts, Ur, LPF, POC neg     Yeast, UA neg    POCT GLYCOSYLATED HEMOGLOBIN (HGB A1C)      Result Value Range   Hemoglobin A1C 8.0      ASSESSMENT:  Burning with urination - Plan: POCT urinalysis dipstick, POCT UA - Microscopic Only, Urine culture, ciprofloxacin (CIPRO) 500 MG tablet  MURMUR  Obesity  IDDM - Plan: POCT glycosylated hemoglobin (Hb A1C),  COMPLETE METABOLIC PANEL WITH GFR  HYPERCHOLESTEROLEMIA - Plan: COMPLETE METABOLIC PANEL WITH GFR, NMR Lipoprofile with Lipids  HYPERTENSION - Plan: lisinopril (PRINIVIL,ZESTRIL) 20 MG tablet  GOITER, MULTINODULAR - Plan: TSH  Occlusion and stenosis of carotid artery without mention of cerebral infarction, unspecified  laterality  AORTIC VALVE REPLACEMENT, HX OF  Anemia  HTN (hypertension) - Plan: lisinopril (PRINIVIL,ZESTRIL) 20 MG tablet  Acute prostatitis - Plan: ciprofloxacin (CIPRO) 500 MG tablet suspected  PLAN: Reduce the lisinopril in 1/2. Orders Placed This Encounter  Procedures  . Urine culture  . COMPLETE METABOLIC PANEL WITH GFR  . NMR Lipoprofile with Lipids  . TSH  . POCT urinalysis dipstick  . POCT UA - Microscopic Only  . POCT glycosylated hemoglobin (Hb A1C)   Meds ordered this encounter  Medications  . lisinopril (PRINIVIL,ZESTRIL) 20 MG tablet    Sig: Take 0.5 tablets (10 mg total) by mouth daily.    Dispense:  30 tablet    Refill:  5    Order Specific Question:  Supervising Provider    Answer:  Ernestina Penna [[id number]]  . ciprofloxacin (CIPRO) 500 MG tablet    Sig: Take 1 tablet (500 mg total) by mouth 2 (two) times daily.    Dispense:  28 tablet    Refill:  0        Dr Woodroe Mode Recommendations  Diet and Exercise discussed with patient.  For nutrition information, I recommend books:  1).Eat to Live by Dr Monico Hoar. 2).Prevent and Reverse Heart Disease by Dr Suzzette Righter. 3) Dr Katherina Right Book:  Program to Reverse Diabetes  Exercise recommendations are:  If unable to walk, then the patient can exercise in a chair 3 times a day. By flapping arms like a bird gently and raising legs outwards to the front.  If ambulatory, the patient can go for walks for 30 minutes 3 times a week. Then increase the intensity and duration as tolerated.  Goal is to try to attain exercise frequency to 5 times a week.  If applicable: Best to perform resistance exercises (machines or weights) 2 days a week and cardio type exercises 3 days per week.  LTC discussed needed to lose weight. Discussed the  Dietary changes needed. And his misconceptions about food.  Return in about 2 weeks (around 08/16/2012) for Recheck medical problems. Recheck the UTI  Meldrick Buttery P.  [place] Charon, M.D.

## 2012-08-03 ENCOUNTER — Telehealth: Payer: Self-pay | Admitting: Family Medicine

## 2012-08-03 LAB — NMR LIPOPROFILE WITH LIPIDS
Cholesterol, Total: 136 mg/dL (ref <200)
HDL Particle Number: 29.3 umol/L — ABNORMAL LOW (ref >=30.5)
HDL Size: 8.5 nm — ABNORMAL LOW (ref >=9.2)
HDL-C: 36 mg/dL — ABNORMAL LOW (ref >=40)
LDL (calc): 78 mg/dL (ref <100)
LDL Particle Number: 1478 nmol/L — ABNORMAL HIGH (ref <1000)
LDL Size: 20.1 nm — ABNORMAL LOW (ref >20.5)
LP-IR Score: 64 — ABNORMAL HIGH (ref <=45)
Large HDL-P: 1.8 umol/L — ABNORMAL LOW (ref >=4.8)
Large VLDL-P: 2 nmol/L (ref <=2.7)
Small LDL Particle Number: 977 nmol/L — ABNORMAL HIGH (ref <=527)
Triglycerides: 108 mg/dL (ref <150)
VLDL Size: 43.3 nm (ref <=46.6)

## 2012-08-03 NOTE — Telephone Encounter (Signed)
Pt aware all labs not back and will call after Dr Modesto Charon reviews.

## 2012-08-05 ENCOUNTER — Other Ambulatory Visit: Payer: Self-pay | Admitting: Family Medicine

## 2012-08-05 DIAGNOSIS — N419 Inflammatory disease of prostate, unspecified: Secondary | ICD-10-CM

## 2012-08-05 LAB — URINE CULTURE: Colony Count: >=100000

## 2012-08-05 MED ORDER — DOXYCYCLINE HYCLATE 100 MG PO TABS: 100.0000 mg | ORAL_TABLET | Freq: Two times a day (BID) | ORAL | Status: DC

## 2012-08-05 NOTE — Progress Notes (Signed)
Quick Note:  Labs abnormal. The urine culture grew a staph species Resistant to the cipro. Patient needs to change antibiotics to doxycycline. Ordered in EPIC. The DM is not controlled.. Patient needs to see the Pharmacist to adjust DM meds and review with patient. Keep follow up as planned. ______

## 2012-08-20 ENCOUNTER — Encounter: Payer: Self-pay | Admitting: Family Medicine

## 2012-08-20 ENCOUNTER — Ambulatory Visit (INDEPENDENT_AMBULATORY_CARE_PROVIDER_SITE_OTHER): Payer: Medicare Other | Admitting: Family Medicine

## 2012-08-20 ENCOUNTER — Telehealth: Payer: Self-pay | Admitting: Family Medicine

## 2012-08-20 VITALS — BP 136/61 | HR 70 | Temp 97.3°F | Wt 308.6 lb

## 2012-08-20 DIAGNOSIS — E669 Obesity, unspecified: Secondary | ICD-10-CM

## 2012-08-20 DIAGNOSIS — E109 Type 1 diabetes mellitus without complications: Secondary | ICD-10-CM | POA: Diagnosis not present

## 2012-08-20 DIAGNOSIS — I6529 Occlusion and stenosis of unspecified carotid artery: Secondary | ICD-10-CM

## 2012-08-20 DIAGNOSIS — IMO0001 Reserved for inherently not codable concepts without codable children: Secondary | ICD-10-CM | POA: Insufficient documentation

## 2012-08-20 DIAGNOSIS — E7800 Pure hypercholesterolemia, unspecified: Secondary | ICD-10-CM

## 2012-08-20 DIAGNOSIS — R350 Frequency of micturition: Secondary | ICD-10-CM | POA: Diagnosis not present

## 2012-08-20 DIAGNOSIS — I10 Essential (primary) hypertension: Secondary | ICD-10-CM

## 2012-08-20 DIAGNOSIS — N390 Urinary tract infection, site not specified: Secondary | ICD-10-CM | POA: Diagnosis not present

## 2012-08-20 NOTE — Progress Notes (Signed)
Patient ID: [name], male   DOB: 1945/07/19, 67 y.o.   MRN: [id number] SUBJECTIVE: CC: Chief Complaint  Patient presents with  . Follow-up    2 week follow up BP     HPI: Patient is here for follow up of hypertension/UTI: he actually had a low BP the last time denies Headache;deniesChest Pain;denies weakness;denies Shortness of Breath or Orthopnea;denies Visual changes;denies palpitations;denies cough;denies pedal edema;denies symptoms of TIA or stroke; admits to Compliance with medications. denies Problems with medications.  UTI/Prostatitis: feel fine.  Past Medical History  Diagnosis Date  . HYPERTENSION 01/22/2009  . 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 BPH (benign prostatic hypertrophy) 04/15/2010  . DEPRESSION 01/22/2009  . HYPERCHOLESTEROLEMIA 01/22/2009  . 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 Morbid obesity   . Heart valve replaced   . Colon polyps   . Ulcer    Past Surgical History  Procedure Laterality Date  . Appendectomy    . Aortic valve replacement  July 2006    #20 stentless [place] porcine valve  . Dental surgery  05/2004    Dental extractions  . Heel spur surgery      resection of heel spur  . Colonoscopy  04/24/2007    Christella Hartigan: normal  . Tonsillectomy    . Bilateral cataract surg    . Colonoscopy with esophagogastroduodenoscopy (egd) N/A 04/05/2012    Procedure: COLONOSCOPY WITH ESOPHAGOGASTRODUODENOSCOPY (EGD);  Surgeon: Corbin Ade, MD;  Location: [facility];  Service: Endoscopy;  Laterality: N/A;  10;15   History   Social History  . Marital Status: Divorced    Spouse Name: N/A    Number of Children: 1  . Years of Education: N/A   Occupational History  . [employer] doctor, retired    Social History Main Topics  . Smoking status: Former Smoker    Quit date: 09/20/1961  . Smokeless tobacco: Not on file  . Alcohol Use: No  . Drug Use: Yes    Special: Marijuana  . Sexually Active: Not on file   Other Topics Concern  . Not on file   Social  History Narrative   Lives alone.   Quit smoking approximately 28 years ago.   Long haul truck driver, lives in [place].   Family History  Problem Relation Age of Onset  . Diabetes     Current Outpatient Prescriptions on File Prior to Visit  Medication Sig Dispense Refill  . acarbose (PRECOSE) 100 MG tablet Take 1 tablet (100 mg total) by mouth 3 (three) times daily with meals.  90 tablet  5  . aspirin 81 MG tablet Take 81 mg by mouth daily.        . citalopram (CELEXA) 20 MG tablet Take 1 tablet (20 mg total) by mouth daily.  30 tablet  5  . dexlansoprazole (DEXILANT) 60 MG capsule Take 1 capsule (60 mg total) by mouth daily.  30 capsule  5  . doxycycline (VIBRA-TABS) 100 MG tablet Take 1 tablet (100 mg total) by mouth 2 (two) times daily.  28 tablet  0  . furosemide (LASIX) 40 MG tablet Take 1 tablet (40 mg total) by mouth daily.  30 tablet  5  . glucose blood test strip Use as instructed  100 each  0  . glyBURIDE (DIABETA) 5 MG tablet Take 1 tablet (5 mg total) by mouth 2 (two) times daily.  60 tablet  5  . insulin detemir (LEVEMIR) 100 UNIT/ML injection Inject 0.8 mLs (80 Units total) into the skin 2 (two)  times daily.  10 mL  5  . lansoprazole (PREVACID) 30 MG capsule Take 1 capsule (30 mg total) by mouth daily.  90 capsule  3  . levothyroxine (SYNTHROID, LEVOTHROID) 88 MCG tablet Take 1 tablet (88 mcg total) by mouth daily.  30 tablet  5  . linagliptin (TRADJENTA) 5 MG TABS tablet Take 1 tablet (5 mg total) by mouth daily.  56 tablet  0  . lisinopril (PRINIVIL,ZESTRIL) 20 MG tablet Take 0.5 tablets (10 mg total) by mouth daily.  30 tablet  5  . metoprolol tartrate (LOPRESSOR) 25 MG tablet Take 0.5 tablets (12.5 mg total) by mouth 2 (two) times daily.  60 tablet  5  . Multiple Vitamins-Minerals (MENS MULTI VITAMIN & MINERAL PO) Take 1 tablet by mouth daily.        . Omega-3 Fatty Acids (FISH OIL) 1200 MG CAPS Take 1-2 capsules by mouth 2 (two) times daily. 1 capsule by mouth q am, 2  capsules by mouth q pm      . polyethylene glycol-electrolytes (TRILYTE) 420 G solution Take 4,000 mLs by mouth as directed.  4000 mL  0  . simvastatin (ZOCOR) 40 MG tablet Take 1 tablet (40 mg total) by mouth at bedtime.  30 tablet  5   No current facility-administered medications on file prior to visit.   Allergies  Allergen Reactions  . Phenothiazines Anaphylaxis  . Pioglitazone Swelling   Immunization History  Administered Date(s) Administered  . Influenza Whole 09/24/2008   Prior to Admission medications   Medication Sig Start Date End Date Taking? Authorizing Provider  acarbose (PRECOSE) 100 MG tablet Take 1 tablet (100 mg total) by mouth 3 (three) times daily with meals. 04/16/12   Horald Pollen, PA-C  aspirin 81 MG tablet Take 81 mg by mouth daily.      Historical Provider, MD  citalopram (CELEXA) 20 MG tablet Take 1 tablet (20 mg total) by mouth daily. 04/16/12   Horald Pollen, PA-C  dexlansoprazole (DEXILANT) 60 MG capsule Take 1 capsule (60 mg total) by mouth daily. 04/16/12   Horald Pollen, PA-C  doxycycline (VIBRA-TABS) 100 MG tablet Take 1 tablet (100 mg total) by mouth 2 (two) times daily. 08/05/12   Ileana Ladd, MD  furosemide (LASIX) 40 MG tablet Take 1 tablet (40 mg total) by mouth daily. 04/16/12   Horald Pollen, PA-C  glucose blood test strip Use as instructed 05/31/12   Ernestina Penna, MD  glyBURIDE (DIABETA) 5 MG tablet Take 1 tablet (5 mg total) by mouth 2 (two) times daily. 04/16/12   Horald Pollen, PA-C  insulin detemir (LEVEMIR) 100 UNIT/ML injection Inject 0.8 mLs (80 Units total) into the skin 2 (two) times daily. 04/16/12   Horald Pollen, PA-C  lansoprazole (PREVACID) 30 MG capsule Take 1 capsule (30 mg total) by mouth daily. 04/12/12   Nira Retort, NP  levothyroxine (SYNTHROID, LEVOTHROID) 88 MCG tablet Take 1 tablet (88 mcg total) by mouth daily. 04/16/12   Horald Pollen, PA-C  linagliptin (TRADJENTA) 5 MG TABS tablet Take 1 tablet (5 mg total) by mouth  daily. 06/20/12   Tammy Eckard, PHARMD  lisinopril (PRINIVIL,ZESTRIL) 20 MG tablet Take 0.5 tablets (10 mg total) by mouth daily. 08/02/12   Ileana Ladd, MD  metoprolol tartrate (LOPRESSOR) 25 MG tablet Take 0.5 tablets (12.5 mg total) by mouth 2 (two) times daily. 04/16/12   Horald Pollen, PA-C  Multiple Vitamins-Minerals (MENS MULTI VITAMIN &  MINERAL PO) Take 1 tablet by mouth daily.      Historical Provider, MD  Omega-3 Fatty Acids (FISH OIL) 1200 MG CAPS Take 1-2 capsules by mouth 2 (two) times daily. 1 capsule by mouth q am, 2 capsules by mouth q pm    Historical Provider, MD  polyethylene glycol-electrolytes (TRILYTE) 420 G solution Take 4,000 mLs by mouth as directed. 04/02/12   Corbin Ade, MD  simvastatin (ZOCOR) 40 MG tablet Take 1 tablet (40 mg total) by mouth at bedtime. 04/16/12   Horald Pollen, PA-C    ROS: As above in the HPI. All other systems are stable or negative.  OBJECTIVE: APPEARANCE:  Patient in no acute distress.The patient appeared well nourished and normally developed. Acyanotic. Waist:51 inches VITAL SIGNS:BP 136/61  Pulse 70  Temp(Src) 97.3 F (36.3 C) (Oral)  Wt 308 lb 9.6 oz (139.98 kg)  BMI 40.72 kg/m2 WM Weight and waist increase  SKIN: warm and  Dry without overt rashes, tattoos and scars  HEAD and Neck: without JVD, Head and scalp: normal Eyes:No scleral icterus. Fundi normal, eye movements normal. Ears: Auricle normal, canal normal, Tympanic membranes normal, insufflation normal. Nose: normal Throat: normal Neck & thyroid: normal Bilateral carotid Bruits  CHEST & LUNGS: Chest wall: normal Lungs: Clear  CVS: Reveals the PMI to be normally located. Regular rhythm, First and Second Heart sounds are normal,  absence of murmurs, rubs or gallops. Peripheral vasculature: Radial pulses: normal Dorsal pedis pulses: normal Posterior pulses: normal  ABDOMEN:  Appearance: obese Benign, no organomegaly, no masses, no Abdominal Aortic  enlargement. No Guarding , no rebound. No Bruits. Bowel sounds: normal  RECTAL: N/A GU: N/A  EXTREMETIES: nonedematous. Both Femoral and Pedal pulses are normal.  MUSCULOSKELETAL:  Spine: normal Joints: intact  NEUROLOGIC: oriented to time,place and person; nonfocal. Strength is normal Sensory is normal Reflexes are normal Cranial Nerves are normal.  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 Urine culture  . POCT UA - Microscopic Only  . POCT urinalysis dipstick    Await labs  Results for orders placed in visit on 08/02/12  URINE CULTURE      Result Value Range   Culture STAPHYLOCOCCUS SPECIES (COAGULASE NEGATIVE)     Colony Count >=100,000 COLONIES/ML     Organism ID, Bacteria STAPHYLOCOCCUS SPECIES (COAGULASE NEGATIVE)    COMPLETE METABOLIC PANEL WITH GFR      Result Value Range   Sodium 135  135 - 145 mEq/L   Potassium 5.0  3.5 - 5.3 mEq/L   Chloride 101  96 - 112 mEq/L   CO2 26  19 - 32 mEq/L   Glucose, Bld 216 (*) 70 - 99 mg/dL   BUN 18  6 - 23 mg/dL   Creat 1.61  0.96 - 0.45 mg/dL   Total Bilirubin 0.5  0.3 - 1.2 mg/dL   Alkaline Phosphatase 53  39 - 117 U/L   AST 21  0 - 37 U/L   ALT 26  0 - 53 U/L   Total Protein 6.9  6.0 - 8.3 g/dL   Albumin 3.8  3.5 - 5.2 g/dL   Calcium 9.4  8.4 - 40.9 mg/dL   GFR, Est African  American 74     GFR, Est Non African American 64    NMR LIPOPROFILE WITH LIPIDS      Result Value Range   LDL Particle Number [id number] (*) <1000 nmol/L   LDL (calc) 78  <100 mg/dL   HDL-C 36 (*) >=21 mg/dL   Triglycerides 308  <657 mg/dL   Cholesterol, Total 846  <200 mg/dL   HDL Particle Number 96.2 (*) >=30.5 umol/L    Large HDL-P 1.8 (*) >=4.8 umol/L   Large VLDL-P 2.0  <=2.7 nmol/L   Small LDL Particle Number [id number] (*) <=527 nmol/L   LDL Size 20.1 (*) >20.5 nm   HDL Size 8.5 (*) >=9.2 nm   VLDL Size 43.3  <=46.6 nm   LP-IR Score 64 (*) <=45  TSH      Result Value Range   TSH 2.985  0.350 - 4.500 uIU/mL  POCT URINALYSIS DIPSTICK      Result Value Range   Color, UA yellow     Clarity, UA cloudy     Glucose, UA 250     Bilirubin, UA neg     Ketones, UA neg     Spec Grav, UA 1.010     Blood, UA mod     pH, UA 6.5     Protein, UA 200     Urobilinogen, UA negative     Nitrite, UA pos     Leukocytes, UA moderate (2+)    POCT UA - MICROSCOPIC ONLY      Result Value Range   WBC, Ur, HPF, POC 75-100     RBC, urine, microscopic 10-15     Bacteria, U Microscopic mod     Mucus, UA neg     Epithelial cells, urine per micros occ     Crystals, Ur, HPF, POC neg     Casts, Ur, LPF, POC neg     Yeast, UA neg    POCT GLYCOSYLATED HEMOGLOBIN (HGB A1C)      Result Value Range   Hemoglobin A1C 8.0     Reviewed labs with patient.  Return in about 3 months (around 11/20/2012) for Recheck medical problems.  Darla Mcdonald P. [place] Charon, M.D.

## 2012-08-21 NOTE — Telephone Encounter (Signed)
Spoke with pt he stated he did leave a specimen but spoke with girls in lab and they found no urine specimen  Advised pt to come by at his convience to leave specimen

## 2012-08-27 ENCOUNTER — Telehealth: Payer: Self-pay | Admitting: Family Medicine

## 2012-08-27 NOTE — Telephone Encounter (Signed)
appt made

## 2012-08-28 ENCOUNTER — Encounter: Payer: Self-pay | Admitting: Family Medicine

## 2012-08-28 ENCOUNTER — Ambulatory Visit (INDEPENDENT_AMBULATORY_CARE_PROVIDER_SITE_OTHER): Payer: Medicare Other | Admitting: Family Medicine

## 2012-08-28 VITALS — BP 102/62 | HR 77 | Temp 97.2°F | Wt 306.8 lb

## 2012-08-28 DIAGNOSIS — R350 Frequency of micturition: Secondary | ICD-10-CM | POA: Diagnosis not present

## 2012-08-28 DIAGNOSIS — IMO0001 Reserved for inherently not codable concepts without codable children: Secondary | ICD-10-CM

## 2012-08-28 DIAGNOSIS — E7800 Pure hypercholesterolemia, unspecified: Secondary | ICD-10-CM | POA: Diagnosis not present

## 2012-08-28 DIAGNOSIS — R251 Tremor, unspecified: Secondary | ICD-10-CM

## 2012-08-28 DIAGNOSIS — R300 Dysuria: Secondary | ICD-10-CM

## 2012-08-28 DIAGNOSIS — R259 Unspecified abnormal involuntary movements: Secondary | ICD-10-CM

## 2012-08-28 DIAGNOSIS — E669 Obesity, unspecified: Secondary | ICD-10-CM

## 2012-08-28 DIAGNOSIS — I10 Essential (primary) hypertension: Secondary | ICD-10-CM | POA: Diagnosis not present

## 2012-08-28 LAB — POCT URINALYSIS DIPSTICK
Bilirubin, UA: NEGATIVE
Glucose, UA: NEGATIVE
Ketones, UA: NEGATIVE
Nitrite, UA: NEGATIVE
Protein, UA: NEGATIVE
Spec Grav, UA: 1.02
Urobilinogen, UA: NEGATIVE
pH, UA: 6

## 2012-08-28 LAB — POCT UA - MICROSCOPIC ONLY
Crystals, Ur, HPF, POC: NEGATIVE
Yeast, UA: NEGATIVE

## 2012-08-28 NOTE — Patient Instructions (Signed)
      Dr Mikayela Deats's Recommendations  Diet and Exercise discussed with patient.  For nutrition information, I recommend books:  1).Eat to Live by Dr Joel Fuhrman. 2).Prevent and Reverse Heart Disease by Dr Caldwell Esselstyn. 3) Dr Neal Barnard's Book:  Program to Reverse Diabetes  Exercise recommendations are:  If unable to walk, then the patient can exercise in a chair 3 times a day. By flapping arms like a bird gently and raising legs outwards to the front.  If ambulatory, the patient can go for walks for 30 minutes 3 times a week. Then increase the intensity and duration as tolerated.  Goal is to try to attain exercise frequency to 5 times a week.  If applicable: Best to perform resistance exercises (machines or weights) 2 days a week and cardio type exercises 3 days per week.  

## 2012-08-28 NOTE — Progress Notes (Signed)
Patient ID: [name], male   DOB: 04-Apr-1945, 67 y.o.   MRN: [id number] SUBJECTIVE: CC: Chief Complaint  Patient presents with  . Urinary Tract Infection    REPEATED URINE  PERSONAL PROBLEM    HPI: Had a UTI with Staph aureus in the UCx came for follow up Urinalysis and culture.  No dysuria but whenever he is holding his abdomen his scrotal sac is quivering. No pain.  Past Medical History  Diagnosis Date  . HYPERTENSION 01/22/2009  . 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 BPH (benign prostatic hypertrophy) 04/15/2010  . DEPRESSION 01/22/2009  . HYPERCHOLESTEROLEMIA 01/22/2009  . 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 Morbid obesity   . Heart valve replaced   . Colon polyps   . Ulcer    Past Surgical History  Procedure Laterality Date  . Appendectomy    . Aortic valve replacement  July 2006    #20 stentless [place] porcine valve  . Dental surgery  05/2004    Dental extractions  . Heel spur surgery      resection of heel spur  . Colonoscopy  04/24/2007    Christella Hartigan: normal  . Tonsillectomy    . Bilateral cataract surg    . Colonoscopy with esophagogastroduodenoscopy (egd) N/A 04/05/2012    Procedure: COLONOSCOPY WITH ESOPHAGOGASTRODUODENOSCOPY (EGD);  Surgeon: Corbin Ade, MD;  Location: [facility];  Service: Endoscopy;  Laterality: N/A;  10;15   History   Social History  . Marital Status: Divorced    Spouse Name: N/A    Number of Children: 1  . Years of Education: N/A   Occupational History  . [employer] doctor, retired    Social History Main Topics  . Smoking status: Former Smoker    Quit date: 09/20/1961  . Smokeless tobacco: Not on file  . Alcohol Use: No  . Drug Use: Yes    Special: Marijuana  . Sexually Active: Not on file   Other Topics Concern  . Not on file   Social History Narrative   Lives alone.   Quit smoking approximately 28 years ago.   Long haul truck driver, lives in [place].   Family History  Problem Relation Age of Onset  . Diabetes      Current Outpatient Prescriptions on File Prior to Visit  Medication Sig Dispense Refill  . acarbose (PRECOSE) 100 MG tablet Take 1 tablet (100 mg total) by mouth 3 (three) times daily with meals.  90 tablet  5  . aspirin 81 MG tablet Take 81 mg by mouth daily.        . citalopram (CELEXA) 20 MG tablet Take 1 tablet (20 mg total) by mouth daily.  30 tablet  5  . dexlansoprazole (DEXILANT) 60 MG capsule Take 1 capsule (60 mg total) by mouth daily.  30 capsule  5  . doxycycline (VIBRA-TABS) 100 MG tablet Take 1 tablet (100 mg total) by mouth 2 (two) times daily.  28 tablet  0  . furosemide (LASIX) 40 MG tablet Take 1 tablet (40 mg total) by mouth daily.  30 tablet  5  . glucose blood test strip Use as instructed  100 each  0  . glyBURIDE (DIABETA) 5 MG tablet Take 1 tablet (5 mg total) by mouth 2 (two) times daily.  60 tablet  5  . insulin detemir (LEVEMIR) 100 UNIT/ML injection Inject 0.8 mLs (80 Units total) into the skin 2 (two) times daily.  10 mL  5  . lansoprazole (PREVACID) 30 MG capsule Take 1  capsule (30 mg total) by mouth daily.  90 capsule  3  . levothyroxine (SYNTHROID, LEVOTHROID) 88 MCG tablet Take 1 tablet (88 mcg total) by mouth daily.  30 tablet  5  . linagliptin (TRADJENTA) 5 MG TABS tablet Take 1 tablet (5 mg total) by mouth daily.  56 tablet  0  . lisinopril (PRINIVIL,ZESTRIL) 20 MG tablet Take 0.5 tablets (10 mg total) by mouth daily.  30 tablet  5  . metoprolol tartrate (LOPRESSOR) 25 MG tablet Take 0.5 tablets (12.5 mg total) by mouth 2 (two) times daily.  60 tablet  5  . Multiple Vitamins-Minerals (MENS MULTI VITAMIN & MINERAL PO) Take 1 tablet by mouth daily.        . Omega-3 Fatty Acids (FISH OIL) 1200 MG CAPS Take 1-2 capsules by mouth 2 (two) times daily. 1 capsule by mouth q am, 2 capsules by mouth q pm      . polyethylene glycol-electrolytes (TRILYTE) 420 G solution Take 4,000 mLs by mouth as directed.  4000 mL  0  . simvastatin (ZOCOR) 40 MG tablet Take 1 tablet (40  mg total) by mouth at bedtime.  30 tablet  5   No current facility-administered medications on file prior to visit.   Allergies  Allergen Reactions  . Phenothiazines Anaphylaxis  . Pioglitazone Swelling   Immunization History  Administered Date(s) Administered  . Influenza Whole 09/24/2008   Prior to Admission medications   Medication Sig Start Date End Date Taking? Authorizing Provider  acarbose (PRECOSE) 100 MG tablet Take 1 tablet (100 mg total) by mouth 3 (three) times daily with meals. 04/16/12  Yes Horald Pollen, PA-C  aspirin 81 MG tablet Take 81 mg by mouth daily.     Yes Historical Provider, MD  citalopram (CELEXA) 20 MG tablet Take 1 tablet (20 mg total) by mouth daily. 04/16/12  Yes Horald Pollen, PA-C  dexlansoprazole (DEXILANT) 60 MG capsule Take 1 capsule (60 mg total) by mouth daily. 04/16/12  Yes Horald Pollen, PA-C  doxycycline (VIBRA-TABS) 100 MG tablet Take 1 tablet (100 mg total) by mouth 2 (two) times daily. 08/05/12  Yes Ileana Ladd, MD  furosemide (LASIX) 40 MG tablet Take 1 tablet (40 mg total) by mouth daily. 04/16/12  Yes Horald Pollen, PA-C  glucose blood test strip Use as instructed 05/31/12  Yes Ernestina Penna, MD  glyBURIDE (DIABETA) 5 MG tablet Take 1 tablet (5 mg total) by mouth 2 (two) times daily. 04/16/12  Yes Horald Pollen, PA-C  insulin detemir (LEVEMIR) 100 UNIT/ML injection Inject 0.8 mLs (80 Units total) into the skin 2 (two) times daily. 04/16/12  Yes Horald Pollen, PA-C  lansoprazole (PREVACID) 30 MG capsule Take 1 capsule (30 mg total) by mouth daily. 04/12/12  Yes Nira Retort, NP  levothyroxine (SYNTHROID, LEVOTHROID) 88 MCG tablet Take 1 tablet (88 mcg total) by mouth daily. 04/16/12  Yes Horald Pollen, PA-C  linagliptin (TRADJENTA) 5 MG TABS tablet Take 1 tablet (5 mg total) by mouth daily. 06/20/12  Yes Tammy Eckard, PHARMD  lisinopril (PRINIVIL,ZESTRIL) 20 MG tablet Take 0.5 tablets (10 mg total) by mouth daily. 08/02/12  Yes Ileana Ladd, MD   metoprolol tartrate (LOPRESSOR) 25 MG tablet Take 0.5 tablets (12.5 mg total) by mouth 2 (two) times daily. 04/16/12  Yes Horald Pollen, PA-C  Multiple Vitamins-Minerals (MENS MULTI VITAMIN & MINERAL PO) Take 1 tablet by mouth daily.     Yes Historical Provider, MD  Omega-3 Fatty Acids (FISH OIL) 1200 MG CAPS Take 1-2 capsules by mouth 2 (two) times daily. 1 capsule by mouth q am, 2 capsules by mouth q pm   Yes Historical Provider, MD  polyethylene glycol-electrolytes (TRILYTE) 420 G solution Take 4,000 mLs by mouth as directed. 04/02/12  Yes Corbin Ade, MD  simvastatin (ZOCOR) 40 MG tablet Take 1 tablet (40 mg total) by mouth at bedtime. 04/16/12  Yes Horald Pollen, PA-C     ROS: As above in the HPI. All other systems are stable or negative.  OBJECTIVE: APPEARANCE:  Patient in no acute distress.The patient appeared well nourished and normally developed. Acyanotic. Waist: VITAL SIGNS:BP 102/62  Pulse 77  Temp(Src) 97.2 F (36.2 C) (Oral)  Wt 306 lb 12.8 oz (139.164 kg)  BMI 40.49 kg/m2 Morbidly obese WM   SKIN: warm and  Dry without overt rashes, tattoos and scars  HEAD and Neck: without JVD, Head and scalp: normal Eyes:No scleral icterus. Fundi normal, eye movements normal. Ears: Auricle normal, canal normal, Tympanic membranes normal, insufflation normal. Nose: normal Throat: normal Neck & thyroid: normal  CHEST & LUNGS: Chest wall: normal Lungs: Clear  CVS: Reveals the PMI to be normally located. Regular rhythm, First and Second Heart sounds are normal,  absence of murmurs, rubs or gallops. Peripheral vasculature: Radial pulses: normal Dorsal pedis pulses: normal Posterior pulses: normal  ABDOMEN:  Appearance: obese Benign, no organomegaly, no masses, no Abdominal Aortic enlargement. No Guarding , no rebound. No Bruits. Bowel sounds: normal  RECTAL: N/A GU: short penis, no discharge Scrotum normal. Patient has significant tremors and when he holds is  large abdomen he causes transmitted tremors to ause the scrotum to quiver.   NEUROLOGIC: oriented to time,place and person  strength is normal Sensory is normal He has marked intention tremors with past pointing and very subtle pill rolling. No stiffness or shuffling gait obvious but patient admits to difficulty starting off to walk.  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 Urine culture  . Ambulatory referral to Neurology    Referral Priority:  Routine    Referral Type:  Consultation    Referral Reason:  Specialty Services Required    Requested Specialty:  Neurology    Number of Visits Requested:  1  . POCT urinalysis dipstick  . POCT UA - Microscopic Only    Results for orders placed in visit on 08/28/12  POCT URINALYSIS DIPSTICK      Result Value Range   Color, UA yellow     Clarity, UA clear     Glucose, UA neg     Bilirubin, UA neg     Ketones, UA neg     Spec Grav, UA 1.020     Blood, UA trace     pH, UA 6.0     Protein, UA neg     Urobilinogen, UA negative     Nitrite, UA neg     Leukocytes, UA Trace    POCT UA - MICROSCOPIC ONLY      Result Value Range   WBC, Ur, HPF, POC 5-10     RBC, urine, microscopic 10-15     Bacteria, U Microscopic few     Mucus, UA mod  Epithelial cells, urine per micros few     Crystals, Ur, HPF, POC neg     Casts, Ur, LPF, POC occ     Yeast, UA neg      Return if symptoms worsen or fail to improve, for Recheck medical problems.  Montrel Donahoe P. Modesto Charon, M.D.

## 2012-08-29 ENCOUNTER — Telehealth: Payer: Self-pay | Admitting: *Deleted

## 2012-08-29 NOTE — Telephone Encounter (Signed)
Patient called back about urinalysis that was done yesterday. Informed him that the culture is not back yet. U/A did show a trace amount of RBC, WBC, and bacteria.    Since he has dysuria do you want to treat this or wait for culture results?

## 2012-08-30 ENCOUNTER — Telehealth: Payer: Self-pay | Admitting: Family Medicine

## 2012-08-30 LAB — URINE CULTURE: Organism ID, Bacteria: NO GROWTH

## 2012-08-30 NOTE — Progress Notes (Signed)
Quick Note:  Call patient. Labs normal.Urine culture was negative. No further infection present. No change in plan. ______

## 2012-08-30 NOTE — Telephone Encounter (Signed)
Pt aware we are awaiting results.

## 2012-08-30 NOTE — Telephone Encounter (Signed)
Await UCx

## 2012-08-30 NOTE — Telephone Encounter (Signed)
Pt aware awaiting results

## 2012-08-31 ENCOUNTER — Telehealth: Payer: Self-pay | Admitting: Family Medicine

## 2012-08-31 NOTE — Telephone Encounter (Signed)
Pt aware of labs (  Urine culture )

## 2012-09-10 ENCOUNTER — Telehealth: Payer: Self-pay | Admitting: Family Medicine

## 2012-09-10 NOTE — Telephone Encounter (Signed)
Patient was called and appt made to discuss diabetes appt made for wed, august 20th.

## 2012-09-12 ENCOUNTER — Ambulatory Visit: Payer: Self-pay

## 2012-09-27 ENCOUNTER — Institutional Professional Consult (permissible substitution): Payer: Medicare Other | Admitting: Neurology

## 2012-10-15 ENCOUNTER — Telehealth: Payer: Self-pay | Admitting: Cardiology

## 2012-10-15 NOTE — Telephone Encounter (Signed)
I spoke with the patient. He had a thyroid ultrasound done on 07/30/12. I am not certain if [name] saw the results. I informed the patient that it appeared that a new nodule was identified. I explained I will forward to [name] and Elita Quick to review and call him back with recommendations. He is agreeable.

## 2012-10-15 NOTE — Telephone Encounter (Signed)
New Problem  Pt recently had a Thyroid scan, request results.

## 2012-10-15 NOTE — Telephone Encounter (Signed)
I had previously discussed this with Dr. Modesto Charon and we agreed that we would go ahead and order a needle aspirate on the thyroid nodule.  I did compare the results with 2008 and there were multiple nodules at that time as well.  However, given a possible new nodule we will go ahead and set him up for a needle biopsy.  I called to discuss this with the patient.

## 2012-10-16 ENCOUNTER — Other Ambulatory Visit: Payer: Self-pay | Admitting: Cardiology

## 2012-10-16 DIAGNOSIS — M5126 Other intervertebral disc displacement, lumbar region: Secondary | ICD-10-CM | POA: Diagnosis not present

## 2012-10-16 DIAGNOSIS — R221 Localized swelling, mass and lump, neck: Secondary | ICD-10-CM

## 2012-10-16 NOTE — Telephone Encounter (Signed)
Pt has been scheduled for needle biopsy of thyroid nodules 10/2 at 9:45 am at [facility].  He is to come off his ASA - OK per Dr Antoine Poche.  Pt is aware.

## 2012-10-17 ENCOUNTER — Other Ambulatory Visit: Payer: Self-pay | Admitting: *Deleted

## 2012-10-17 MED ORDER — GLYBURIDE 5 MG PO TABS: 5.0000 mg | ORAL_TABLET | Freq: Two times a day (BID) | ORAL | Status: DC

## 2012-10-18 ENCOUNTER — Other Ambulatory Visit: Payer: Self-pay

## 2012-10-18 DIAGNOSIS — E119 Type 2 diabetes mellitus without complications: Secondary | ICD-10-CM

## 2012-10-18 MED ORDER — ACARBOSE 100 MG PO TABS: 100.0000 mg | ORAL_TABLET | Freq: Three times a day (TID) | ORAL | Status: DC

## 2012-10-19 ENCOUNTER — Encounter: Payer: Self-pay | Admitting: Family Medicine

## 2012-10-19 ENCOUNTER — Ambulatory Visit (INDEPENDENT_AMBULATORY_CARE_PROVIDER_SITE_OTHER): Payer: Medicare Other

## 2012-10-19 ENCOUNTER — Ambulatory Visit (INDEPENDENT_AMBULATORY_CARE_PROVIDER_SITE_OTHER): Payer: Medicare Other | Admitting: Family Medicine

## 2012-10-19 VITALS — BP 100/57 | HR 83 | Temp 97.7°F | Wt 307.0 lb

## 2012-10-19 DIAGNOSIS — M1990 Unspecified osteoarthritis, unspecified site: Secondary | ICD-10-CM

## 2012-10-19 DIAGNOSIS — S66911A Strain of unspecified muscle, fascia and tendon at wrist and hand level, right hand, initial encounter: Secondary | ICD-10-CM

## 2012-10-19 DIAGNOSIS — S63509A Unspecified sprain of unspecified wrist, initial encounter: Secondary | ICD-10-CM | POA: Diagnosis not present

## 2012-10-19 DIAGNOSIS — M129 Arthropathy, unspecified: Secondary | ICD-10-CM

## 2012-10-19 DIAGNOSIS — R52 Pain, unspecified: Secondary | ICD-10-CM | POA: Diagnosis not present

## 2012-10-19 MED ORDER — DICLOFENAC SODIUM 1 % TD GEL: 2.0000 g | Freq: Four times a day (QID) | TRANSDERMAL | Status: DC

## 2012-10-19 NOTE — Progress Notes (Signed)
Patient ID: [name], male   DOB: Nov 15, 1945, 67 y.o.   MRN: [id number] SUBJECTIVE: CC: Chief Complaint  Patient presents with  . Follow-up    c/o wrist finger pain    HPI: Swelling and pain of the right wrist.3days ago was getting a strength evaluation and was pushing. Dr Channing Mutters the back specialist. Today the right wrist is swollen and tender. No known trauma.no fall.  Past Medical History  Diagnosis Date  . HYPERTENSION 01/22/2009  . 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 BPH (benign prostatic hypertrophy) 04/15/2010  . DEPRESSION 01/22/2009  . HYPERCHOLESTEROLEMIA 01/22/2009  . 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 Morbid obesity   . Heart valve replaced   . Colon polyps   . Ulcer    Past Surgical History  Procedure Laterality Date  . Appendectomy    . Aortic valve replacement  July 2006    #20 stentless [place] porcine valve  . Dental surgery  05/2004    Dental extractions  . Heel spur surgery      resection of heel spur  . Colonoscopy  04/24/2007    Christella Hartigan: normal  . Tonsillectomy    . Bilateral cataract surg    . Colonoscopy with esophagogastroduodenoscopy (egd) N/A 04/05/2012    Procedure: COLONOSCOPY WITH ESOPHAGOGASTRODUODENOSCOPY (EGD);  Surgeon: Corbin Ade, MD;  Location: [facility];  Service: Endoscopy;  Laterality: N/A;  10;15   History   Social History  . Marital Status: Divorced    Spouse Name: N/A    Number of Children: 1  . Years of Education: N/A   Occupational History  . [employer] doctor, retired    Social History Main Topics  . Smoking status: Former Smoker    Quit date: 09/20/1961  . Smokeless tobacco: Not on file  . Alcohol Use: No  . Drug Use: Yes    Special: Marijuana  . Sexual Activity: Not on file   Other Topics Concern  . Not on file   Social History Narrative   Lives alone.   Quit smoking approximately 28 years ago.   Long haul truck driver, lives in [place].   Family History  Problem Relation Age of Onset  . Diabetes      Current Outpatient Prescriptions on File Prior to Visit  Medication Sig Dispense Refill  . acarbose (PRECOSE) 100 MG tablet Take 1 tablet (100 mg total) by mouth 3 (three) times daily with meals.  90 tablet  4  . aspirin 81 MG tablet Take 81 mg by mouth daily.        . citalopram (CELEXA) 20 MG tablet Take 1 tablet (20 mg total) by mouth daily.  30 tablet  5  . dexlansoprazole (DEXILANT) 60 MG capsule Take 1 capsule (60 mg total) by mouth daily.  30 capsule  5  . doxycycline (VIBRA-TABS) 100 MG tablet Take 1 tablet (100 mg total) by mouth 2 (two) times daily.  28 tablet  0  . furosemide (LASIX) 40 MG tablet Take 1 tablet (40 mg total) by mouth daily.  30 tablet  5  . glucose blood test strip Use as instructed  100 each  0  . glyBURIDE (DIABETA) 5 MG tablet Take 1 tablet (5 mg total) by mouth 2 (two) times daily.  60 tablet  1  . insulin detemir (LEVEMIR) 100 UNIT/ML injection Inject 0.8 mLs (80 Units total) into the skin 2 (two) times daily.  10 mL  5  . lansoprazole (PREVACID) 30 MG capsule Take 1 capsule (30 mg  total) by mouth daily.  90 capsule  3  . levothyroxine (SYNTHROID, LEVOTHROID) 88 MCG tablet Take 1 tablet (88 mcg total) by mouth daily.  30 tablet  5  . linagliptin (TRADJENTA) 5 MG TABS tablet Take 1 tablet (5 mg total) by mouth daily.  56 tablet  0  . lisinopril (PRINIVIL,ZESTRIL) 20 MG tablet Take 0.5 tablets (10 mg total) by mouth daily.  30 tablet  5  . metoprolol tartrate (LOPRESSOR) 25 MG tablet Take 0.5 tablets (12.5 mg total) by mouth 2 (two) times daily.  60 tablet  5  . Multiple Vitamins-Minerals (MENS MULTI VITAMIN & MINERAL PO) Take 1 tablet by mouth daily.        . Omega-3 Fatty Acids (FISH OIL) 1200 MG CAPS Take 1-2 capsules by mouth 2 (two) times daily. 1 capsule by mouth q am, 2 capsules by mouth q pm      . polyethylene glycol-electrolytes (TRILYTE) 420 G solution Take 4,000 mLs by mouth as directed.  4000 mL  0  . simvastatin (ZOCOR) 40 MG tablet Take 1 tablet (40  mg total) by mouth at bedtime.  30 tablet  5   No current facility-administered medications on file prior to visit.   Allergies  Allergen Reactions  . Phenothiazines Anaphylaxis  . Pioglitazone Swelling   Immunization History  Administered Date(s) Administered  . Influenza Whole 09/24/2008   Prior to Admission medications   Medication Sig Start Date End Date Taking? Authorizing Provider  acarbose (PRECOSE) 100 MG tablet Take 1 tablet (100 mg total) by mouth 3 (three) times daily with meals. 10/18/12  Yes Ileana Ladd, MD  aspirin 81 MG tablet Take 81 mg by mouth daily.     Yes Historical Provider, MD  citalopram (CELEXA) 20 MG tablet Take 1 tablet (20 mg total) by mouth daily. 04/16/12  Yes Horald Pollen, PA-C  dexlansoprazole (DEXILANT) 60 MG capsule Take 1 capsule (60 mg total) by mouth daily. 04/16/12  Yes Horald Pollen, PA-C  doxycycline (VIBRA-TABS) 100 MG tablet Take 1 tablet (100 mg total) by mouth 2 (two) times daily. 08/05/12  Yes Ileana Ladd, MD  furosemide (LASIX) 40 MG tablet Take 1 tablet (40 mg total) by mouth daily. 04/16/12  Yes Horald Pollen, PA-C  glucose blood test strip Use as instructed 05/31/12  Yes Ernestina Penna, MD  glyBURIDE (DIABETA) 5 MG tablet Take 1 tablet (5 mg total) by mouth 2 (two) times daily. 10/17/12  Yes Ernestina Penna, MD  insulin detemir (LEVEMIR) 100 UNIT/ML injection Inject 0.8 mLs (80 Units total) into the skin 2 (two) times daily. 04/16/12  Yes Horald Pollen, PA-C  lansoprazole (PREVACID) 30 MG capsule Take 1 capsule (30 mg total) by mouth daily. 04/12/12  Yes Nira Retort, NP  levothyroxine (SYNTHROID, LEVOTHROID) 88 MCG tablet Take 1 tablet (88 mcg total) by mouth daily. 04/16/12  Yes Horald Pollen, PA-C  linagliptin (TRADJENTA) 5 MG TABS tablet Take 1 tablet (5 mg total) by mouth daily. 06/20/12  Yes Tammy Eckard, PHARMD  lisinopril (PRINIVIL,ZESTRIL) 20 MG tablet Take 0.5 tablets (10 mg total) by mouth daily. 08/02/12  Yes Ileana Ladd, MD   metoprolol tartrate (LOPRESSOR) 25 MG tablet Take 0.5 tablets (12.5 mg total) by mouth 2 (two) times daily. 04/16/12  Yes Horald Pollen, PA-C  Multiple Vitamins-Minerals (MENS MULTI VITAMIN & MINERAL PO) Take 1 tablet by mouth daily.     Yes Historical Provider, MD  Omega-3 Fatty  Acids (FISH OIL) 1200 MG CAPS Take 1-2 capsules by mouth 2 (two) times daily. 1 capsule by mouth q am, 2 capsules by mouth q pm   Yes Historical Provider, MD  polyethylene glycol-electrolytes (TRILYTE) 420 G solution Take 4,000 mLs by mouth as directed. 04/02/12  Yes Corbin Ade, MD  simvastatin (ZOCOR) 40 MG tablet Take 1 tablet (40 mg total) by mouth at bedtime. 04/16/12  Yes Horald Pollen, PA-C     ROS: As above in the HPI. All other systems are stable or negative.  OBJECTIVE: APPEARANCE:  Patient in no acute distress.The patient appeared well nourished and normally developed. Acyanotic. Waist: VITAL SIGNS:BP 100/57  Pulse 83  Temp(Src) 97.7 F (36.5 C) (Oral)  Wt 307 lb (139.254 kg)  BMI 40.51 kg/m2 Morbidly obese WM  SKIN: warm and  Dry without overt rashes, tattoos and scars  HEAD and Neck: without JVD, Head and scalp: normal Eyes:No scleral icterus. Fundi normal, eye movements normal. Ears: Auricle normal, canal normal, Tympanic membranes normal, insufflation normal. Nose: normal Throat: normal Neck & thyroid: normal  CHEST & LUNGS: Chest wall: normal Lungs: Clear  CVS: Reveals the PMI to be normally located. Regular rhythm, First and Second Heart sounds are normal,  absence of murmurs, rubs or gallops. Peripheral vasculature: Radial pulses: normal Dorsal pedis pulses: normal Posterior pulses: normal  ABDOMEN:  Appearance: normal Benign, no organomegaly, no masses, no Abdominal Aortic enlargement. No Guarding , no rebound. No Bruits. Bowel sounds: normal  RECTAL: N/A GU: N/A  EXTREMETIES: nonedematous.  MUSCULOSKELETAL:  Spine: normal Joints: intact  NEUROLOGIC:  oriented to time,place and person; nonfocal. Strength is normal Sensory is normal Reflexes are normal Cranial Nerves are normal.  ASSESSMENT: Pain - Plan: DG Wrist Complete Right, Arthritis Panel  Arthritis - Plan: Arthritis Panel  Strain of wrist, right, initial encounter - Plan: diclofenac sodium (VOLTAREN) 1 % GEL   PLAN: Orders Placed This Encounter  Procedures  . DG Wrist Complete Right    Standing Status: Future     Number of Occurrences: 1     Standing Expiration Date: 12/19/2013    Order Specific Question:  Reason for Exam (SYMPTOM  OR DIAGNOSIS REQUIRED)    Answer:  pain in right wrist    Order Specific Question:  Preferred imaging location?    Answer:  Internal  . Arthritis Panel   [facility] reading (PRIMARY) by  Dr. Modesto Charon: degenerative changes. No acute changes.  Meds ordered this encounter  Medications  . diclofenac sodium (VOLTAREN) 1 % GEL    Sig: Apply 2 g topically 4 (four) times daily.    Dispense:  100 g    Refill:  1    Return in about 2 weeks (around 11/02/2012) for Recheck medical problems.  Ice packs every 2 hours for 20 minutes.  Jonny Dearden P. Modesto Charon, M.D.

## 2012-10-20 LAB — ARTHRITIS PANEL
Anti Nuclear Antibody(ANA): NEGATIVE
Rhuematoid fact SerPl-aCnc: <7 IU/mL (ref 0.0–13.9)
Sed Rate: 22 mm/hr (ref 0–30)
Uric Acid: 5.8 mg/dL (ref 3.7–8.6)

## 2012-10-21 NOTE — Progress Notes (Signed)
Quick Note:  Call patient. 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 No change in plan. ______

## 2012-10-24 DIAGNOSIS — E1149 Type 2 diabetes mellitus with other diabetic neurological complication: Secondary | ICD-10-CM | POA: Diagnosis not present

## 2012-10-24 DIAGNOSIS — E1159 Type 2 diabetes mellitus with other circulatory complications: Secondary | ICD-10-CM | POA: Diagnosis not present

## 2012-10-24 DIAGNOSIS — E119 Type 2 diabetes mellitus without complications: Secondary | ICD-10-CM | POA: Diagnosis not present

## 2012-10-25 ENCOUNTER — Ambulatory Visit (HOSPITAL_COMMUNITY)
Admission: RE | Admit: 2012-10-25 | Discharge: 2012-10-25 | Disposition: A | Discharge status: Home / Self Care | Payer: Medicare Other | Source: Ambulatory Visit | Attending: Cardiology | Admitting: Cardiology

## 2012-10-25 ENCOUNTER — Encounter (HOSPITAL_COMMUNITY): Payer: Self-pay

## 2012-10-25 DIAGNOSIS — D449 Neoplasm of uncertain behavior of unspecified endocrine gland: Secondary | ICD-10-CM | POA: Diagnosis not present

## 2012-10-25 DIAGNOSIS — E041 Nontoxic single thyroid nodule: Secondary | ICD-10-CM | POA: Insufficient documentation

## 2012-10-25 DIAGNOSIS — R221 Localized swelling, mass and lump, neck: Secondary | ICD-10-CM

## 2012-10-25 MED ORDER — LIDOCAINE HCL (PF) 2 % IJ SOLN
10.0000 mL | Freq: Once | INTRAMUSCULAR | Status: AC
  Administered 2012-10-25: 10 mL
  Filled 2012-10-25: qty 10

## 2012-10-25 MED ORDER — LIDOCAINE HCL (PF) 2 % IJ SOLN
INTRAMUSCULAR | Status: AC
  Administered 2012-10-25: 10 mL
  Filled 2012-10-25: qty 10

## 2012-10-25 NOTE — Progress Notes (Signed)
Biopsy complete no signs of distress  

## 2012-10-25 NOTE — Procedures (Signed)
PreOperative Dx: Right thyroid nodule Postoperative Dx: Right thyroid nodule Procedure:   US guided FNA of RIGHT thyroid nodule Radiologist:  Tyron Russell Anesthesia:  2 ml of 1 lidocaine Specimen:  FNA x 3  EBL:   None Complications: None

## 2012-10-26 ENCOUNTER — Telehealth: Payer: Self-pay | Admitting: *Deleted

## 2012-10-26 NOTE — Telephone Encounter (Signed)
Patient is unsure if he administered his Levemir this morning.  Blood sugar was 167 at 8:00am. He has eaten breakfast. He normally checks his blood sugar in the morning and evening. Next does of Levemir is due this evening. He also takes glyburide. Instructed him to not use Levemir again this morning but to check his blood sugar at lunch and report if it is elevated. Take glyburide and evening dose of Levemir as prescribed.  Patient states understanding and agreement to plan.

## 2012-10-27 ENCOUNTER — Other Ambulatory Visit: Payer: Self-pay | Admitting: Nurse Practitioner

## 2012-10-30 DIAGNOSIS — E1139 Type 2 diabetes mellitus with other diabetic ophthalmic complication: Secondary | ICD-10-CM | POA: Diagnosis not present

## 2012-10-30 DIAGNOSIS — E11349 Type 2 diabetes mellitus with severe nonproliferative diabetic retinopathy without macular edema: Secondary | ICD-10-CM | POA: Diagnosis not present

## 2012-10-31 ENCOUNTER — Telehealth: Payer: Self-pay | Admitting: Cardiology

## 2012-10-31 NOTE — Telephone Encounter (Signed)
New Problem  Pt calling thyroid test results.

## 2012-10-31 NOTE — Telephone Encounter (Signed)
Pt aware Dr Antoine Poche to further discuss results with another opinion as to follow up

## 2012-11-02 ENCOUNTER — Encounter: Payer: Self-pay | Admitting: Family Medicine

## 2012-11-02 ENCOUNTER — Ambulatory Visit (INDEPENDENT_AMBULATORY_CARE_PROVIDER_SITE_OTHER): Payer: Medicare Other | Admitting: Family Medicine

## 2012-11-02 VITALS — BP 108/64 | HR 68 | Temp 98.0°F | Ht 73.0 in | Wt 306.6 lb

## 2012-11-02 DIAGNOSIS — Z23 Encounter for immunization: Secondary | ICD-10-CM

## 2012-11-02 DIAGNOSIS — E109 Type 1 diabetes mellitus without complications: Secondary | ICD-10-CM

## 2012-11-02 DIAGNOSIS — K219 Gastro-esophageal reflux disease without esophagitis: Secondary | ICD-10-CM

## 2012-11-02 DIAGNOSIS — E890 Postprocedural hypothyroidism: Secondary | ICD-10-CM | POA: Diagnosis not present

## 2012-11-02 DIAGNOSIS — E042 Nontoxic multinodular goiter: Secondary | ICD-10-CM

## 2012-11-02 DIAGNOSIS — IMO0001 Reserved for inherently not codable concepts without codable children: Secondary | ICD-10-CM

## 2012-11-02 DIAGNOSIS — E7800 Pure hypercholesterolemia, unspecified: Secondary | ICD-10-CM | POA: Diagnosis not present

## 2012-11-02 DIAGNOSIS — Z954 Presence of other heart-valve replacement: Secondary | ICD-10-CM

## 2012-11-02 DIAGNOSIS — N400 Enlarged prostate without lower urinary tract symptoms: Secondary | ICD-10-CM

## 2012-11-02 DIAGNOSIS — I10 Essential (primary) hypertension: Secondary | ICD-10-CM

## 2012-11-02 DIAGNOSIS — F3289 Other specified depressive episodes: Secondary | ICD-10-CM | POA: Diagnosis not present

## 2012-11-02 DIAGNOSIS — M720 Palmar fascial fibromatosis [Dupuytren]: Secondary | ICD-10-CM

## 2012-11-02 DIAGNOSIS — F329 Major depressive disorder, single episode, unspecified: Secondary | ICD-10-CM | POA: Diagnosis not present

## 2012-11-02 DIAGNOSIS — E669 Obesity, unspecified: Secondary | ICD-10-CM

## 2012-11-02 LAB — POCT GLYCOSYLATED HEMOGLOBIN (HGB A1C): Hemoglobin A1C: 8

## 2012-11-02 MED ORDER — PANTOPRAZOLE SODIUM 40 MG PO TBEC: 40.0000 mg | DELAYED_RELEASE_TABLET | Freq: Every day | ORAL | Status: DC

## 2012-11-02 NOTE — Patient Instructions (Signed)
      Dr Triva Hueber's Recommendations  For nutrition information, I recommend books:  1).Eat to Live by Dr Joel Fuhrman. 2).Prevent and Reverse Heart Disease by Dr Caldwell Esselstyn. 3) Dr Neal Barnard's Book:  Program to Reverse Diabetes  Exercise recommendations are:  If unable to walk, then the patient can exercise in a chair 3 times a day. By flapping arms like a bird gently and raising legs outwards to the front.  If ambulatory, the patient can go for walks for 30 minutes 3 times a week. Then increase the intensity and duration as tolerated.  Goal is to try to attain exercise frequency to 5 times a week.  If applicable: Best to perform resistance exercises (machines or weights) 2 days a week and cardio type exercises 3 days per week.  

## 2012-11-02 NOTE — Progress Notes (Signed)
Patient ID: [name], male   DOB: [birth year], 67 y.o.   MRN: [id number] SUBJECTIVE: CC: Chief Complaint  Patient presents with  . Follow-up    2 week reck  seeing dr Laurian Brim on oct 19    HPI: Used ice on the wrist and it was fine.didn't have to use the voltaren gel.  Patient is here for follow up of Diabetes Mellitus/HTN/HLD: Symptoms evaluated: Denies Nocturia ,Denies Urinary Frequency , denies Blurred vision ,deniesDizziness,denies.Dysuria,denies paresthesias, denies extremity pain or ulcers.Marland Kitchendenies chest pain. has had an annual eye exam. do check the feet. Does check CBGs. Average CBG: Denies episodes of hypoglycemia. Does have an emergency hypoglycemic plan. admits toCompliance with medications. Denies Problems with medications.  No change in Dupuytren's contractures.  Recently had a Thyroid nodule Bx awaiting the pathology. Reviewed in EPIC  Past Medical History  Diagnosis Date  . HYPERTENSION 01/22/2009  . 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 BPH (benign prostatic hypertrophy) 04/15/2010  . DEPRESSION 01/22/2009  . HYPERCHOLESTEROLEMIA 01/22/2009  . 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 Morbid obesity   . Heart valve replaced   . Colon polyps   . Ulcer    Past Surgical History  Procedure Laterality Date  . Appendectomy    . Aortic valve replacement  July 2006    #20 stentless [place] porcine valve  . Dental surgery  05/2004    Dental extractions  . Heel spur surgery      resection of heel spur  . Colonoscopy  04/24/2007    Christella Hartigan: normal  . Tonsillectomy    . Bilateral cataract surg    . Colonoscopy with esophagogastroduodenoscopy (egd) N/A 04/05/2012    Procedure: COLONOSCOPY WITH ESOPHAGOGASTRODUODENOSCOPY (EGD);  Surgeon: Corbin Ade, MD;  Location: [facility];  Service: Endoscopy;  Laterality: N/A;  10;15   History   Social History  . Marital Status: Single    Spouse Name: N/A    Number of Children: 1  . Years of Education: N/A   Occupational  History  . [employer] doctor, retired    Social History Main Topics  . Smoking status: Former Smoker    Quit date: 09/20/1961  . Smokeless tobacco: Not on file  . Alcohol Use: No  . Drug Use: Yes    Special: Marijuana  . Sexual Activity: Not on file   Other Topics Concern  . Not on file   Social History Narrative   Lives alone.   Quit smoking approximately 28 years ago.   Long haul truck driver, lives in [place].   Family History  Problem Relation Age of Onset  . Diabetes     Current Outpatient Prescriptions on File Prior to Visit  Medication Sig Dispense Refill  . acarbose (PRECOSE) 100 MG tablet Take 1 tablet (100 mg total) by mouth 3 (three) times daily with meals.  90 tablet  4  . aspirin 81 MG tablet Take 81 mg by mouth daily.        . citalopram (CELEXA) 20 MG tablet Take 1 tablet (20 mg total) by mouth daily.  30 tablet  5  . diclofenac sodium (VOLTAREN) 1 % GEL Apply 2 g topically 4 (four) times daily.  100 g  1  . FREESTYLE LITE test strip TEST FOUR TIMES DAILY  100 each  2  . furosemide (LASIX) 40 MG tablet Take 1 tablet (40 mg total) by mouth daily.  30 tablet  5  . glyBURIDE (DIABETA) 5 MG tablet Take 1 tablet (5 mg total) by mouth  2 (two) times daily.  60 tablet  1  . insulin detemir (LEVEMIR) 100 UNIT/ML injection Inject 0.8 mLs (80 Units total) into the skin 2 (two) times daily.  10 mL  5  . levothyroxine (SYNTHROID, LEVOTHROID) 88 MCG tablet Take 1 tablet (88 mcg total) by mouth daily.  30 tablet  5  . linagliptin (TRADJENTA) 5 MG TABS tablet Take 1 tablet (5 mg total) by mouth daily.  56 tablet  0  . lisinopril (PRINIVIL,ZESTRIL) 20 MG tablet Take 0.5 tablets (10 mg total) by mouth daily.  30 tablet  5  . metoprolol tartrate (LOPRESSOR) 25 MG tablet Take 0.5 tablets (12.5 mg total) by mouth 2 (two) times daily.  60 tablet  5  . Multiple Vitamins-Minerals (MENS MULTI VITAMIN & MINERAL PO) Take 1 tablet by mouth daily.        . Omega-3 Fatty Acids (FISH OIL) 1200  MG CAPS Take 1-2 capsules by mouth 2 (two) times daily. 1 capsule by mouth q am, 2 capsules by mouth q pm      . polyethylene glycol-electrolytes (TRILYTE) 420 G solution Take 4,000 mLs by mouth as directed.  4000 mL  0  . simvastatin (ZOCOR) 40 MG tablet Take 1 tablet (40 mg total) by mouth at bedtime.  30 tablet  5   No current facility-administered medications on file prior to visit.   Allergies  Allergen Reactions  . Phenothiazines Anaphylaxis  . Pioglitazone Swelling   Immunization History  Administered Date(s) Administered  . Influenza Whole 09/24/2008  . Influenza,inj,Quad PF,36+ Mos 11/02/2012   Prior to Admission medications   Medication Sig Start Date End Date Taking? Authorizing Provider  acarbose (PRECOSE) 100 MG tablet Take 1 tablet (100 mg total) by mouth 3 (three) times daily with meals. 10/18/12  Yes Ileana Ladd, MD  aspirin 81 MG tablet Take 81 mg by mouth daily.     Yes Historical Provider, MD  citalopram (CELEXA) 20 MG tablet Take 1 tablet (20 mg total) by mouth daily. 04/16/12  Yes Horald Pollen, PA-C  diclofenac sodium (VOLTAREN) 1 % GEL Apply 2 g topically 4 (four) times daily. 10/19/12  Yes Ileana Ladd, MD  FREESTYLE LITE test strip TEST FOUR TIMES DAILY 10/27/12  Yes Ernestina Penna, MD  furosemide (LASIX) 40 MG tablet Take 1 tablet (40 mg total) by mouth daily. 04/16/12  Yes Horald Pollen, PA-C  glyBURIDE (DIABETA) 5 MG tablet Take 1 tablet (5 mg total) by mouth 2 (two) times daily. 10/17/12  Yes Ernestina Penna, MD  insulin detemir (LEVEMIR) 100 UNIT/ML injection Inject 0.8 mLs (80 Units total) into the skin 2 (two) times daily. 04/16/12  Yes Horald Pollen, PA-C  levothyroxine (SYNTHROID, LEVOTHROID) 88 MCG tablet Take 1 tablet (88 mcg total) by mouth daily. 04/16/12  Yes Horald Pollen, PA-C  linagliptin (TRADJENTA) 5 MG TABS tablet Take 1 tablet (5 mg total) by mouth daily. 06/20/12  Yes Tammy Eckard, PHARMD  lisinopril (PRINIVIL,ZESTRIL) 20 MG tablet Take 0.5  tablets (10 mg total) by mouth daily. 08/02/12  Yes Ileana Ladd, MD  metoprolol tartrate (LOPRESSOR) 25 MG tablet Take 0.5 tablets (12.5 mg total) by mouth 2 (two) times daily. 04/16/12  Yes Horald Pollen, PA-C  Multiple Vitamins-Minerals (MENS MULTI VITAMIN & MINERAL PO) Take 1 tablet by mouth daily.     Yes Historical Provider, MD  Omega-3 Fatty Acids (FISH OIL) 1200 MG CAPS Take 1-2 capsules by mouth 2 (two) times  daily. 1 capsule by mouth q am, 2 capsules by mouth q pm   Yes Historical Provider, MD  polyethylene glycol-electrolytes (TRILYTE) 420 G solution Take 4,000 mLs by mouth as directed. 04/02/12  Yes Corbin Ade, MD  simvastatin (ZOCOR) 40 MG tablet Take 1 tablet (40 mg total) by mouth at bedtime. 04/16/12  Yes Horald Pollen, PA-C  traMADol Janean Sark) 50 MG tablet  10/17/12  Yes Historical Provider, MD  pantoprazole (PROTONIX) 40 MG tablet Take 1 tablet (40 mg total) by mouth daily. 11/02/12   Ileana Ladd, MD     ROS: As above in the HPI. All other systems are stable or negative.  OBJECTIVE: APPEARANCE:  Patient in no acute distress.The patient appeared well nourished and normally developed. Acyanotic. Waist: VITAL SIGNS:BP 108/64  Pulse 68  Temp(Src) 98 F (36.7 C) (Oral)  Ht 6\' 1"  (1.854 m)  Wt 306 lb 9.6 oz (139.073 kg)  BMI 40.46 kg/m2 Obese WM   SKIN: warm and  Dry without overt rashes, tattoos and scars  HEAD and Neck: without JVD, Head and scalp: normal Eyes:No scleral icterus. Fundi normal, eye movements normal. Ears: Auricle normal, canal normal, Tympanic membranes normal, insufflation normal. Nose: normal Throat: normal Neck & thyroid: normal  CHEST & LUNGS: Chest wall: normal Lungs: Clear  CVS: Reveals the PMI to be normally located. Regular rhythm, First and Second Heart sounds are normal,  absence of murmurs, rubs or gallops. Peripheral vasculature: Radial pulses: normal Dorsal pedis pulses: normal Posterior pulses: normal  ABDOMEN:   Appearance: obese Benign, no organomegaly, no masses, no Abdominal Aortic enlargement. No Guarding , no rebound. No Bruits. Bowel sounds: normal  RECTAL: N/A GU: N/A  EXTREMETIES: nonedematous.  MUSCULOSKELETAL:  Spine: reduced ROM. Joints: dupuytren's contractures of the hands.  NEUROLOGIC: oriented to time,place and person; nonfocal.  ASSESSMENT: HYPERCHOLESTEROLEMIA - Plan: CMP14+EGFR, NMR, lipoprofile  HYPERTENSION  IDDM - Plan: POCT glycosylated hemoglobin (Hb A1C), CMP14+EGFR  Need for prophylactic vaccination and inoculation against influenza  AORTIC VALVE REPLACEMENT, HX OF  BPH (benign prostatic hypertrophy)  DEPRESSION  Dupuytren's contracture of both hands  GOITER, MULTINODULAR  HYPOTHYROIDISM, POST-RADIATION - Plan: TSH  Obesity  GERD (gastroesophageal reflux disease) - Plan: pantoprazole (PROTONIX) 40 MG tablet   PLAN:  Orders Placed This Encounter  Procedures  . CMP14+EGFR  . NMR, lipoprofile  . TSH  . POCT glycosylated hemoglobin (Hb A1C)    Meds ordered this encounter  Medications  . traMADol (ULTRAM) 50 MG tablet    Sig:   . pantoprazole (PROTONIX) 40 MG tablet    Sig: Take 1 tablet (40 mg total) by mouth daily.    Dispense:  30 tablet    Refill:  3         Dr Woodroe Mode Recommendations  For nutrition information, I recommend books:  1).Eat to Live by Dr Monico Hoar. 2).Prevent and Reverse Heart Disease by Dr Suzzette Righter. 3) Dr Katherina Right Book:  Program to Reverse Diabetes  Exercise recommendations are:  If unable to walk, then the patient can exercise in a chair 3 times a day. By flapping arms like a bird gently and raising legs outwards to the front.  If ambulatory, the patient can go for walks for 30 minutes 3 times a week. Then increase the intensity and duration as tolerated.  Goal is to try to attain exercise frequency to 5 times a week.  If applicable: Best to perform resistance exercises (machines  or weights) 2 days a  week and cardio type exercises 3 days per week.  Return in about 3 months (around 02/02/2013) for Recheck medical problems.  Sorren Vallier P. Modesto Charon, M.D.

## 2012-11-05 LAB — CMP14+EGFR
ALT: 22 IU/L (ref 0–44)
AST: 20 IU/L (ref 0–40)
Albumin/Globulin Ratio: 1.3 (ref 1.1–2.5)
Albumin: 3.9 g/dL (ref 3.6–4.8)
Alkaline Phosphatase: 66 IU/L (ref 39–117)
BUN/Creatinine Ratio: 15 (ref 10–22)
BUN: 18 mg/dL (ref 8–27)
CO2: 19 mmol/L (ref 18–29)
Calcium: 9.9 mg/dL (ref 8.6–10.2)
Chloride: 97 mmol/L (ref 97–108)
Creatinine, Ser: 1.18 mg/dL (ref 0.76–1.27)
GFR calc Af Amer: 72 mL/min/{1.73_m2} (ref >59)
GFR calc non Af Amer: 63 mL/min/{1.73_m2} (ref >59)
Globulin, Total: 3.1 g/dL (ref 1.5–4.5)
Glucose: 161 mg/dL — ABNORMAL HIGH (ref 65–99)
Potassium: 5.2 mmol/L (ref 3.5–5.2)
Sodium: 138 mmol/L (ref 134–144)
Total Bilirubin: 0.3 mg/dL (ref 0.0–1.2)
Total Protein: 7 g/dL (ref 6.0–8.5)

## 2012-11-05 LAB — TSH: TSH: 4.03 u[IU]/mL (ref 0.450–4.500)

## 2012-11-05 LAB — NMR, LIPOPROFILE
Cholesterol: 141 mg/dL (ref <200)
HDL Cholesterol by NMR: 40 mg/dL (ref >=40)
HDL Particle Number: 30.9 umol/L (ref >=30.5)
LDL Particle Number: 1516 nmol/L — ABNORMAL HIGH (ref <1000)
LDL Size: 20.2 nm — ABNORMAL LOW (ref >20.5)
LDLC SERPL CALC-MCNC: 70 mg/dL (ref <100)
LP-IR Score: 62 — ABNORMAL HIGH (ref <=45)
Small LDL Particle Number: 918 nmol/L — ABNORMAL HIGH (ref <=527)
Triglycerides by NMR: 156 mg/dL — ABNORMAL HIGH (ref <150)

## 2012-11-06 ENCOUNTER — Telehealth: Payer: Self-pay | Admitting: Cardiology

## 2012-11-06 ENCOUNTER — Telehealth: Payer: Self-pay | Admitting: Family Medicine

## 2012-11-06 NOTE — Telephone Encounter (Signed)
Pt aware Dr Nash Dimmer office to be setting up an appt for further evaluation of thyroid biopsy.

## 2012-11-06 NOTE — Telephone Encounter (Signed)
Follow Up  No details... Request a call back.. States his nurse is aware of his concern.

## 2012-11-07 ENCOUNTER — Other Ambulatory Visit: Payer: Self-pay | Admitting: Family Medicine

## 2012-11-07 ENCOUNTER — Telehealth: Payer: Self-pay | Admitting: Family Medicine

## 2012-11-07 DIAGNOSIS — E042 Nontoxic multinodular goiter: Secondary | ICD-10-CM

## 2012-11-07 NOTE — Progress Notes (Signed)
Quick Note:  Call Patient Labs abnormal: Dr Antoine Poche had discussed this and I had reviewed. There were new nodules that was Biopsied and there were abnormal cells. However, after review , I would prefer patient to see DR. Gerkin who is a Careers adviser who specializes in thyroid nodules.  Recommendations: Refer to surgeon Dr Gerrit Friends. Done in EPIC   ______

## 2012-11-09 ENCOUNTER — Telehealth: Payer: Self-pay | Admitting: Family Medicine

## 2012-11-09 NOTE — Telephone Encounter (Signed)
Left message for pt to return call.

## 2012-11-12 ENCOUNTER — Encounter: Payer: Self-pay | Admitting: Neurology

## 2012-11-12 ENCOUNTER — Ambulatory Visit (INDEPENDENT_AMBULATORY_CARE_PROVIDER_SITE_OTHER): Payer: Medicare Other | Admitting: Neurology

## 2012-11-12 VITALS — BP 98/61 | HR 76 | Ht 73.0 in | Wt 307.5 lb

## 2012-11-12 DIAGNOSIS — R0609 Other forms of dyspnea: Secondary | ICD-10-CM

## 2012-11-12 DIAGNOSIS — E11349 Type 2 diabetes mellitus with severe nonproliferative diabetic retinopathy without macular edema: Secondary | ICD-10-CM | POA: Diagnosis not present

## 2012-11-12 DIAGNOSIS — E1165 Type 2 diabetes mellitus with hyperglycemia: Secondary | ICD-10-CM | POA: Diagnosis not present

## 2012-11-12 DIAGNOSIS — E669 Obesity, unspecified: Secondary | ICD-10-CM | POA: Diagnosis not present

## 2012-11-12 DIAGNOSIS — E1139 Type 2 diabetes mellitus with other diabetic ophthalmic complication: Secondary | ICD-10-CM | POA: Diagnosis not present

## 2012-11-12 DIAGNOSIS — R609 Edema, unspecified: Secondary | ICD-10-CM | POA: Diagnosis not present

## 2012-11-12 DIAGNOSIS — IMO0002 Reserved for concepts with insufficient information to code with codable children: Secondary | ICD-10-CM | POA: Diagnosis not present

## 2012-11-12 DIAGNOSIS — G250 Essential tremor: Secondary | ICD-10-CM

## 2012-11-12 DIAGNOSIS — R0683 Snoring: Secondary | ICD-10-CM

## 2012-11-12 DIAGNOSIS — E11311 Type 2 diabetes mellitus with unspecified diabetic retinopathy with macular edema: Secondary | ICD-10-CM | POA: Diagnosis not present

## 2012-11-12 MED ORDER — PRIMIDONE 50 MG PO TABS: ORAL_TABLET | ORAL | Status: DC

## 2012-11-12 NOTE — Telephone Encounter (Signed)
Duplicate message Left mess for pt to return call

## 2012-11-12 NOTE — Patient Instructions (Signed)
  Please remember, that any kind of tremor may be exacerbated by anxiety, anger, nervousness, excitement, dehydration, sleep deprivation, by caffeine, and low blood sugar values or blood sugar fluctuations. Some medications, especially some antidepressants and lithium can cause or exacerbate tremors. Tremors may temporarily calm down her subside with the use of a benzodiazepine such as Valium or related medications and with alcohol. Be aware however that drinking alcohol is not an approved treatment or appropriate treatment for tremor control and long-term use of benzodiazepines such as Valium, lorazepam, alprazolam, or clonazepam can cause habit formation, physical and psychological addiction.  Your tremor may respond to a medication, called: Mysoline (primidone) 50 mg strength: Take 1/2 pill each bedtime for 2 weeks, then 1 pill each bedtime for 2 weeks, then 1 1/2 pills each bedtime for 2 weeks, then 2 pills each bedtime thereafter. Common side effects reported are: Sleepiness, drowsiness, balance problems, confusion, and GI related symptoms.

## 2012-11-12 NOTE — Progress Notes (Signed)
Subjective:    Patient ID: [name] is a 67 y.o. male.  HPI  Huston Foley, MD, PhD [facility] [address] [address] [place], [place] [address]  Dear Dr. Modesto Charon,  I saw your patient, [name], upon your kind request in my neurologic clinic today for initial  consultation of her tremors in particular, concern for Parkinson's disease. The patient is unaccompanied today. As you know, [name] is a very pleasant 67 year old right-handed gentleman with an underlying medical history of hyperlipidemia, morbid obesity, diabetes, hypertension, BPH, depression, and hypothyroidism, status post aortic valve replacement surgery, tonsillectomy, colonoscopy, who reports a b/l hand tremor for the past few years, more than worse in the last year. He endorses a FHx of tremor in his younger brother (age 35), his mother, his MGF. They all had or have hand tremors. He has also noted an occasional head tremor. His hand tremor is mildly bothersome. He has occasionally spilled a food or drink. He denies balance problems, but sometimes misses a step. He has not fallen. He denies numbness or tingling in his hands or feet. His last A1c was 8.   His Past Medical History Is Significant For: Past Medical History  Diagnosis Date  . HYPERTENSION 01/22/2009  . 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 BPH (benign prostatic hypertrophy) 04/15/2010  . DEPRESSION 01/22/2009  . HYPERCHOLESTEROLEMIA 01/22/2009  . 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 Morbid obesity   . Heart valve replaced   . Colon polyps   . Ulcer     His Past Surgical History Is Significant For: Past Surgical History  Procedure Laterality Date  . Appendectomy    . Aortic valve replacement  July 2006    #20 stentless [place] porcine valve  . Dental surgery  05/2004    Dental extractions  . Heel spur surgery      resection of heel spur  . Colonoscopy  04/24/2007    [name]: normal  . Tonsillectomy    . Bilateral  cataract surg    . Colonoscopy with esophagogastroduodenoscopy (egd) N/A 04/05/2012    Procedure: COLONOSCOPY WITH ESOPHAGOGASTRODUODENOSCOPY (EGD);  Surgeon: Corbin Ade, MD;  Location[facility];  Service: Endoscopy;  Laterality: N/A;  10;15    His Family History Is Significant For: Family History  Problem Relation Age of Onset  . Diabetes      His Social History Is Significant For: History   Social History  . Marital Status: Single    Spouse Name: N/A    Number of Children: 1  . Years of Education: HS   Occupational History  . [employer] doctor, retired    Social History Main Topics  . Smoking status: Former Smoker    Quit date: 09/20/1961  . Smokeless tobacco: Never Used  . Alcohol Use: No  . Drug Use: Yes    Special: Marijuana  . Sexual Activity: None   Other Topics Concern  . None   Social History Narrative   Lives alone.   Quit smoking approximately 28 years ago.   Long haul truck driver, lives in [place].    His Allergies Are:  Allergies  Allergen Reactions  . Phenothiazines Anaphylaxis  . Pioglitazone Swelling  :   His Current Medications Are:  Outpatient Encounter Prescriptions as of 11/12/2012  Medication Sig Dispense Refill  . acarbose (PRECOSE) 100 MG tablet Take 1 tablet (100 mg total) by mouth 3 (three) times daily with meals.  90 tablet  4  . aspirin 81  MG tablet Take 81 mg by mouth daily.        . citalopram (CELEXA) 20 MG tablet Take 1 tablet (20 mg total) by mouth daily.  30 tablet  5  . diclofenac sodium (VOLTAREN) 1 % GEL Apply 2 g topically 4 (four) times daily.  100 g  1  . FREESTYLE LITE test strip TEST FOUR TIMES DAILY  100 each  2  . furosemide (LASIX) 40 MG tablet Take 1 tablet (40 mg total) by mouth daily.  30 tablet  5  . glyBURIDE (DIABETA) 5 MG tablet Take 1 tablet (5 mg total) by mouth 2 (two) times daily.  60 tablet  1  . insulin detemir (LEVEMIR) 100 UNIT/ML injection Inject 0.8 mLs (80 Units total) into the skin 2 (two)  times daily.  10 mL  5  . levothyroxine (SYNTHROID, LEVOTHROID) 88 MCG tablet Take 1 tablet (88 mcg total) by mouth daily.  30 tablet  5  . linagliptin (TRADJENTA) 5 MG TABS tablet Take 1 tablet (5 mg total) by mouth daily.  56 tablet  0  . lisinopril (PRINIVIL,ZESTRIL) 20 MG tablet Take 0.5 tablets (10 mg total) by mouth daily.  30 tablet  5  . metoprolol tartrate (LOPRESSOR) 25 MG tablet Take 0.5 tablets (12.5 mg total) by mouth 2 (two) times daily.  60 tablet  5  . Multiple Vitamins-Minerals (MENS MULTI VITAMIN & MINERAL PO) Take 1 tablet by mouth daily.        . Omega-3 Fatty Acids (FISH OIL) 1200 MG CAPS Take 1-2 capsules by mouth 2 (two) times daily. 1 capsule by mouth q am, 2 capsules by mouth q pm      . polyethylene glycol-electrolytes (TRILYTE) 420 G solution Take 4,000 mLs by mouth as directed.  4000 mL  0  . simvastatin (ZOCOR) 40 MG tablet Take 1 tablet (40 mg total) by mouth at bedtime.  30 tablet  5  . [DISCONTINUED] pantoprazole (PROTONIX) 40 MG tablet Take 1 tablet (40 mg total) by mouth daily.  30 tablet  3  . [DISCONTINUED] traMADol (ULTRAM) 50 MG tablet       . primidone (MYSOLINE) 50 MG tablet 1/2 pill each bedtime x 2 weeks, then 1 pill nightly x 2 weeks, then 1 1/2 pills nightly x 2 weeks, then 2 pills nightly thereafter.  60 tablet  3   No facility-administered encounter medications on file as of 11/12/2012.   Review of Systems:  Out of a complete 14 point review of systems, all are reviewed and negative with the exception of these symptoms as listed below:   Review of Systems  HENT: Positive for hearing loss.   Respiratory:       Snoring  Cardiovascular:       Murmur  Genitourinary:       Impotence    Objective:  Neurologic Exam  Physical Exam Physical Examination:   Filed Vitals:   11/12/12 1401  BP: 98/61  Pulse: 76    General Examination: The patient is a very pleasant 67 y.o. male in no acute distress. He appears well-developed and well-nourished  and adequately groomed. He is obese.  HEENT: Normocephalic, atraumatic, pupils are equal, round and reactive to light and accommodation. Extraocular tracking is good without limitation to gaze excursion or nystagmus noted. He is s/p b/l cataract repairs. Normal smooth pursuit is noted. Hearing is grossly impaired. Face is symmetric with normal facial animation and normal facial sensation. Speech is clear with no dysarthria noted.  There is no hypophonia. There is no lip, neck/head, jaw or voice tremor. Neck is supple with full range of passive and active motion. There are no carotid bruits on auscultation. Oropharynx exam reveals: mild mouth dryness, adequate dental hygiene and mild airway crowding, due to narrow airway and floppy soft palate. Mallampati is class II. Tongue protrudes centrally and palate elevates symmetrically.    Chest: Clear to auscultation without wheezing, rhonchi or crackles noted.  Heart: S1+S2+0, regular and normal with a 2/6 systolic murmurs, but no rubs or gallops noted.   Abdomen: Soft, non-tender and non-distended with normal bowel sounds appreciated on auscultation.  Extremities: There is 1+ pitting edema in the distal lower extremities bilaterally. He has changes c/w Dupuytren's contracture b/l.  Skin: Warm and dry without trophic changes noted. There are no varicose veins.  Musculoskeletal: exam reveals no obvious joint deformities, but he reports b/l hip pain and LBP.    Neurologically:  Mental status: The patient is awake, alert and oriented in all 4 spheres. His memory, attention, language and knowledge are appropriate. There is no aphasia, agnosia, apraxia or anomia. Speech is clear with normal prosody and enunciation. Thought process is linear. Mood is congruent and affect is normal.  Cranial nerves are as described above under HEENT exam. In addition, shoulder shrug is normal with equal shoulder height noted. Motor exam: Normal bulk, strength and tone is noted.  There is no drift, or rebound. There is no resting tremor. There is a bilateral upper extremity postural and action tremor, which is mild in degree. There tremor frequency is fairly fast and the amplitude is small. On Archimedes spiral drawing there is moderate tremulousness noted. Handwriting is moderately tremulousness, but legible. There is no evidence of micrographia.  Romberg is negative. Reflexes are 1+ throughout. Toes are downgoing bilaterally. Fine motor skills are mildly impaired without decrement in amplitude.  Cerebellar testing shows no dysmetria or intention tremor on finger to nose testing. Heel to shin is unremarkable bilaterally, with the exception of back and hip pain. There is no truncal or gait ataxia.  Sensory exam is intact to light touch, pinprick, vibration, temperature sense and proprioception in the upper and lower extremities.  Gait, station and balance: He stands up with mild difficulty and reports low back pain. His posture is moderately stooped in the lower back. No veering to one side is noted. No leaning to one side is noted. Stance is narrow based. No problems turning are noted. He turns en bloc. He has preserved arm swing while walking.  Assessment and Plan:   In summary, MARVELOUS BOUWENS is a very pleasant 67 y.o.-year old male with a history of hyperlipidemia, morbid obesity, diabetes, hypertension, BPH, depression, and hypothyroidism, status post aortic valve replacement surgery, tonsillectomy, and colonoscopy, who presents with a several year history of bilateral upper extremity tremors as well as an intermittent head tremor. He also endorses a positive family history of tremors. His history and physical exam are most consistent with essential tremor. 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 Otherwise his physical exam is noteworthy for significant obesity; while he endorses snoring he denies any witnessed apneas or  choking sensations while asleep and reports only mild snoring. I talked to him at length about his tremor diagnosis, its prognosis and treatment options. I explained to him that there is most likely a significant genetic component to his tremor. I also explained to him that his antidepressant,  Celexa may be a contributor to the trembling but if it works well for his mood disorder there is no reason to change it. He is mildly affected by the tremor. I explained to him that there is no cure for tremor or and that it will most likely progress. For symptomatic treatment we can try him on Mysoline. He is already on a beta blocker and had recent blood work done which I reviewed. This included TSH. I would like for him to try Mysoline at 50 mg strength. We will build this up in 25 mg increments per week to up to 100 mg at night. I talked to him about potential side effects of this medication. I do not detect any signs and symptoms of parkinsonism. I would like to see him back in about 3 months, sooner if the need arises and have encouraged him to call with any interim questions, concerns, problems, update ordered refill requests. He was in agreement.  Thank you very much for allowing me to participate in the care of this nice patient. If I can be of any further assistance to you please do not hesitate to call me at [phone].  Sincerely,   Huston Foley, MD, PhD

## 2012-11-13 ENCOUNTER — Telehealth: Payer: Self-pay | Admitting: Family Medicine

## 2012-11-13 NOTE — Telephone Encounter (Signed)
Pt notified and states he was given 20mg  of lasix from the [place] and he will call them to let them know he is on lsaix 40mg  States he will see Tammy Oct 30 .

## 2012-11-13 NOTE — Telephone Encounter (Signed)
Pt aware of labs  

## 2012-11-14 ENCOUNTER — Ambulatory Visit (INDEPENDENT_AMBULATORY_CARE_PROVIDER_SITE_OTHER): Payer: No Typology Code available for payment source | Admitting: Surgery

## 2012-11-15 ENCOUNTER — Telehealth: Payer: Self-pay | Admitting: Neurology

## 2012-11-15 NOTE — Telephone Encounter (Signed)
Patient said that he does not have enough pills to last, called pharmacy and they said that it's all he gets

## 2012-11-19 ENCOUNTER — Ambulatory Visit (INDEPENDENT_AMBULATORY_CARE_PROVIDER_SITE_OTHER): Payer: No Typology Code available for payment source | Admitting: Surgery

## 2012-11-19 ENCOUNTER — Telehealth: Payer: Self-pay | Admitting: *Deleted

## 2012-11-19 DIAGNOSIS — M5137 Other intervertebral disc degeneration, lumbosacral region: Secondary | ICD-10-CM | POA: Diagnosis not present

## 2012-11-19 DIAGNOSIS — M47817 Spondylosis without myelopathy or radiculopathy, lumbosacral region: Secondary | ICD-10-CM | POA: Diagnosis not present

## 2012-11-19 DIAGNOSIS — M5380 Other specified dorsopathies, site unspecified: Secondary | ICD-10-CM | POA: Diagnosis not present

## 2012-11-19 NOTE — Telephone Encounter (Signed)
Pt called and LMVM for me to call him back  (11-16-12 at 0953).   I called and LMVM for pt return call if needed.

## 2012-11-22 ENCOUNTER — Encounter: Payer: Self-pay | Admitting: Pharmacist

## 2012-11-22 ENCOUNTER — Telehealth: Payer: Self-pay | Admitting: Pharmacist

## 2012-11-22 ENCOUNTER — Ambulatory Visit (INDEPENDENT_AMBULATORY_CARE_PROVIDER_SITE_OTHER): Payer: Medicare Other | Admitting: Pharmacist

## 2012-11-22 VITALS — BP 120/70 | HR 77 | Ht 73.0 in | Wt 305.0 lb

## 2012-11-22 DIAGNOSIS — R799 Abnormal finding of blood chemistry, unspecified: Secondary | ICD-10-CM

## 2012-11-22 DIAGNOSIS — E119 Type 2 diabetes mellitus without complications: Secondary | ICD-10-CM | POA: Diagnosis not present

## 2012-11-22 DIAGNOSIS — E785 Hyperlipidemia, unspecified: Secondary | ICD-10-CM

## 2012-11-22 DIAGNOSIS — R7989 Other specified abnormal findings of blood chemistry: Secondary | ICD-10-CM

## 2012-11-22 MED ORDER — LINAGLIPTIN-METFORMIN HCL 2.5-500 MG PO TABS: 1.0000 | ORAL_TABLET | Freq: Two times a day (BID) | ORAL | Status: DC

## 2012-11-22 NOTE — Telephone Encounter (Signed)
OK for him for start with dinner tonight - patient notified.

## 2012-11-22 NOTE — Progress Notes (Signed)
Diabetes Follow-Up Visit Chief Complaint:  No chief complaint on file.    Filed Vitals:   11/22/12 1708  BP: 120/70  Pulse: 77   Filed Weights   11/22/12 1708  Weight: 305 lb (138.347 kg)     HPI: Long standing type 2DM with family history of DM.  Patient's last 2 A1c's have been 8.0%.  He previously had well controlled DM with A1C's in the 6 to 7 range.  Began to get worse when metformin stopped in January 2014 due to Scr of 1.45.  Scr since has been normal.  Last Scr was 1.18 on 11/02/2012.    Current Diabetes Medications:  gluburide 5mg  bid, acarbose (Precose) 100mg  tid prior to meals, Tradgenta 5mg  daily and Levemir 80 units bid  Low fat/carbohydrate diet?  No Nicotine Abuse?  No Medication Compliance?  Yes Exercise?  No Alcohol Abuse?  No  Home BG Monitoring:  Checking 2 times a day. High: 240 (highest in the evenings but also high in the mornings)   Low:  100 (had hypoglycemic symptoms)   Exam Edema:  Trace bilaterally  Polyuria:  negative  Polydipsia:  ngative Polyphagia:  negative  BMI:  Body mass index is 40.25 kg/(m^2).    General Appearance:  alert, oriented, no acute distress and obese Mood/Affect:  normal    Lab Results  Component Value Date   HGBA1C 8.0% 11/02/2012    Lab Results  Component Value Date   MICROALBUR 1.50 04/16/2012    LIPID Panel from 11/02/2012:  LDL-P was 1516, TG was 156, HDL was 40 and LDL-C was 70.  TSH = WNL  Microalbumin negative 04/16/2012  Assessment: 1.  Diabetes.  uncontrolled 2.  Blood Pressure.  controlled 3.  Lipids.  Elevated LDL - poor diet 4. History of elevated Scr  Recommendations: 1.  Medication recommendations at this time are as follows:    Discontinue Tradjenta  Start Jentadueta 2.5/500mg  1 tablet BID with food  Consider changing acarbose to incretin mimetic in future but need to see what is going on with increased thyroid nodules.  Patient has appt to see Dr Gerrit Friends about this 2.  Reviewed HBG goals:   Fasting 80-130 and 1-2 hour post prandial <180.  Patient is instructed to check BG 2 times per day.    3.  BP goal < 140/80. 4.  LDL goal of < 100, HDL > 40 and TG < 150. 5.  Eye Exam yearly and Dental Exam every 6 months. 6.  Dietary recommendations:  Discusses ADA carb counting diet and about limiting fats in depth 7.  Physical Activity recommendations:  Increase activity as able - limited by back pain 8.  Return to clinic in 4-6 wks - will check BG and BMET   Time spent counseling patient:  60 minutes  Referring provider:  Pam Drown, PharmD, CPP

## 2012-11-27 ENCOUNTER — Ambulatory Visit: Payer: Self-pay | Admitting: Neurology

## 2012-11-28 ENCOUNTER — Encounter (INDEPENDENT_AMBULATORY_CARE_PROVIDER_SITE_OTHER): Payer: Self-pay | Admitting: Surgery

## 2012-11-28 ENCOUNTER — Ambulatory Visit (INDEPENDENT_AMBULATORY_CARE_PROVIDER_SITE_OTHER): Payer: Medicare Other | Admitting: Surgery

## 2012-11-28 ENCOUNTER — Encounter (INDEPENDENT_AMBULATORY_CARE_PROVIDER_SITE_OTHER): Payer: Self-pay

## 2012-11-28 VITALS — BP 130/76 | HR 68 | Temp 98.0°F | Resp 15 | Ht 73.0 in | Wt 306.1 lb

## 2012-11-28 DIAGNOSIS — D440 Neoplasm of uncertain behavior of thyroid gland: Secondary | ICD-10-CM | POA: Insufficient documentation

## 2012-11-28 DIAGNOSIS — D449 Neoplasm of uncertain behavior of unspecified endocrine gland: Secondary | ICD-10-CM | POA: Diagnosis not present

## 2012-11-28 NOTE — Progress Notes (Signed)
General Surgery [facility]  Chief Complaint  Patient presents with  . New Evaluation    eval multiple thyroid nodules with atypia - referral from Dr. Leodis Sias    HISTORY: Patient is a 67 year old male referred by his primary care physician for evaluation of multinodular thyroid goiter with atypia. Patient has a history of hyperthyroidism dating back many years. He was seen and evaluated by an endocrinologist. He underwent treatment with radioactive iodine. He has been on thyroid hormone supplementation since that time and currently takes levothyroxine 88 mcg daily.   In July 2014 the patient underwent a thyroid ultrasound. This showed bilateral thyroid nodules ranging in size from 2.0-3.0 cm. In October 2014, the patient underwent biopsy of a right-sided thyroid nodule which contained microcalcifications. Cytologic atypia was noted including nuclear grooves and nuclear overlap. A follicular neoplasm could not be excluded. Patient is referred at this time for consideration for excision for definitive diagnosis.  Patient has no prior history of head or neck surgery. There is no family history of thyroid disease and specifically no family history of thyroid cancer. There is no family history of other endocrine neoplasm.  Past Medical History  Diagnosis Date  . HYPERTENSION 01/22/2009  . 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 BPH (benign prostatic hypertrophy) 04/15/2010  . DEPRESSION 01/22/2009  . HYPERCHOLESTEROLEMIA 01/22/2009  . 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 Morbid obesity   . Heart valve replaced   . Colon polyps   . Ulcer   . Heart murmur     Current Outpatient Prescriptions  Medication Sig Dispense Refill  . acarbose (PRECOSE) 100 MG tablet Take 1 tablet (100 mg total) by mouth 3 (three) times daily with meals.  90 tablet  4  . aspirin 81 MG tablet Take 81 mg by mouth daily.        . citalopram (CELEXA) 20 MG tablet Take 1 tablet (20 mg total) by mouth daily.   30 tablet  5  . FREESTYLE LITE test strip TEST FOUR TIMES DAILY  100 each  2  . furosemide (LASIX) 40 MG tablet Take 1 tablet (40 mg total) by mouth daily.  30 tablet  5  . glyBURIDE (DIABETA) 5 MG tablet Take 1 tablet (5 mg total) by mouth 2 (two) times daily.  60 tablet  1  . insulin detemir (LEVEMIR) 100 UNIT/ML injection Inject 0.8 mLs (80 Units total) into the skin 2 (two) times daily.  10 mL  5  . levothyroxine (SYNTHROID, LEVOTHROID) 88 MCG tablet Take 1 tablet (88 mcg total) by mouth daily.  30 tablet  5  . Linagliptin-Metformin HCl 2.5-500 MG TABS Take 1 tablet by mouth 2 (two) times daily.  60 tablet  0  . lisinopril (PRINIVIL,ZESTRIL) 20 MG tablet Take 0.5 tablets (10 mg total) by mouth daily.  30 tablet  5  . Multiple Vitamins-Minerals (MENS MULTI VITAMIN & MINERAL PO) Take 1 tablet by mouth daily.        . Omega-3 Fatty Acids (FISH OIL) 1200 MG CAPS Take 1-2 capsules by mouth 2 (two) times daily. 1 capsule by mouth q am, 2 capsules by mouth q pm      . primidone (MYSOLINE) 50 MG tablet 1/2 pill each bedtime x 2 weeks, then 1 pill nightly x 2 weeks, then 1 1/2 pills nightly x 2 weeks, then 2 pills nightly thereafter.  60 tablet  3  . simvastatin (ZOCOR) 40 MG tablet Take 1 tablet (40 mg total) by mouth at bedtime.  30 tablet  5   No current facility-administered medications for this visit.    Allergies  Allergen Reactions  . Phenothiazines Anaphylaxis  . Pioglitazone Swelling    Family History  Problem Relation Age of Onset  . Diabetes      History   Social History  . Marital Status: Single    Spouse Name: N/A    Number of Children: 1  . Years of Education: HS   Occupational History  . [employer] doctor, retired    Social History Main Topics  . Smoking status: Former Smoker    Quit date: 09/20/1961  . Smokeless tobacco: Never Used  . Alcohol Use: No  . Drug Use: No  . Sexual Activity: None   Other Topics Concern  . None   Social History Narrative   Lives  alone.   Quit smoking approximately 28 years ago.   Long haul truck driver, lives in [place].    REVIEW OF SYSTEMS - PERTINENT POSITIVES ONLY: Denies compressive symptoms. Denies tremor. Denies palpitations.  EXAM: Filed Vitals:   11/28/12 1019  BP: 130/76  Pulse: 68  Temp: 98 F (36.7 C)  Resp: 15    HEENT: normocephalic; pupils equal and reactive; sclerae clear; dentition fair; mucous membranes moist NECK:  Thyroid gland is not enlarged to; subtle nodularity but examination is limited due to body habitus; symmetric on extension; no palpable anterior or posterior cervical lymphadenopathy; no supraclavicular masses; no tenderness CHEST: clear to auscultation bilaterally without rales, rhonchi, or wheezes CARDIAC: regular rate and rhythm with significant grade III murmur; peripheral pulses are full EXT:  non-tender without edema; no deformity NEURO: no gross focal deficits; no sign of tremor   LABORATORY RESULTS: See [facility] ([facility]L-Epic) for most recent results  RADIOLOGY RESULTS: See [facility] (CHL-Epic) for most recent results  IMPRESSION: #1 multinodular thyroid with cytologic atypia on fine needle aspiration biopsy, right thyroid nodule #2 personal history of hyperthyroidism, status post treatment with radioactive iodine #3 personal cardiac history with aortic valve replacement #4 diabetes mellitus, on insulin and oral hypoglycemic agents #5 morbid obesity  PLAN: I discussed with the patient the indications for thyroidectomy. I recommended total thyroidectomy due to the presence of bilateral thyroid nodules, history of radiation exposure, and ultrasound findings showing microcalcifications. Biopsy showed nuclear atypia. Patient's risk of malignancy is approximately 30%.  We discussed total thyroidectomy. We discussed the hospital stay to be anticipated. We discussed potential complications including injury to parathyroid glands and injury to recurrent  laryngeal nerves. We discussed the need for lifelong thyroid hormone replacement. Patient understands and wishes to proceed.  Patient will require cardiac clearance prior to surgery. He will also require evaluation by anesthesiology. We will make arrangements for these consultations.  The risks and benefits of the procedure have been discussed at length with the patient.  The patient understands the proposed procedure, potential alternative treatments, and the course of recovery to be expected.  All of the patient's questions have been answered at this time.  The patient wishes to proceed with surgery.  Velora Heckler, MD, FACS General & Endocrine Surgery [facility]  Primary Care Physician: Redmond Baseman, MD

## 2012-11-28 NOTE — Patient Instructions (Signed)
   THYROID & PARATHYROID SURGERY - POST OP INSTRUCTIONS  Always review your discharge instruction sheet from the facility where your surgery was performed.  A prescription for pain medication may be given to you upon discharge.  Take your pain medication as prescribed.  If narcotic pain medicine is not needed, then you may take acetaminophen (Tylenol) or ibuprofen (Advil) as needed.  Take your usually prescribed medications unless otherwise directed.  If you need a refill on your pain medication, please contact your pharmacy. They will contact our office to request authorization.  Prescriptions will not be processed after 5 pm or on weekends.  Start with a light diet upon arrival home, such as soup and crackers or toast.  Be sure to drink plenty of fluids daily.  Resume your normal diet the day after surgery.  Most patients will experience some swelling and bruising on the chest and neck area.  Ice packs will help.  Swelling and bruising can take several days to resolve.   It is common to experience some constipation if taking pain medication after surgery.  Increasing fluid intake and taking a stool softener will usually help or prevent this problem.  A mild laxative (Milk of Magnesia or Miralax) should be taken according to package directions if there are no bowel movements after 48 hours.  You may remove your bandages 24-48 hours after surgery, and you may shower at that time.  You have steri-strips (small skin tapes) in place directly over the incision.  These strips should be left on the skin for 7-10 days and then removed.  You may resume regular (light) daily activities beginning the next day-such as daily self-care, walking, climbing stairs-gradually increasing activities as tolerated.  You may have sexual intercourse when it is comfortable.  Refrain from any heavy lifting or straining until approved by your doctor.  You may drive when you no longer are taking prescription pain medication,  you can comfortably wear a seatbelt, and you can safely maneuver your car and apply brakes.  You should see your doctor in the office for a follow-up appointment approximately two to three weeks after your surgery.  Make sure that you call for this appointment within a day or two after you arrive home to insure a convenient appointment time.  WHEN TO CALL YOUR DOCTOR: -- Fever greater than 101.5 -- Inability to urinate -- Nausea and/or vomiting - persistent -- Extreme swelling or bruising -- Continued bleeding from incision -- Increased pain, redness, or drainage from the incision -- Difficulty swallowing or breathing -- Muscle cramping or spasms -- Numbness or tingling in hands or around lips  The clinic staff is available to answer your questions during regular business hours.  Please don't hesitate to call and ask to speak to one of the nurses if you have concerns.  Macil Crady M. Saloma Cadena, MD, FACS General & Endocrine Surgery [facility] Office: [phone]  

## 2012-12-05 ENCOUNTER — Encounter: Payer: Self-pay | Admitting: Cardiology

## 2012-12-05 ENCOUNTER — Ambulatory Visit (INDEPENDENT_AMBULATORY_CARE_PROVIDER_SITE_OTHER): Payer: Medicare Other | Admitting: Cardiology

## 2012-12-05 VITALS — BP 123/70 | HR 79 | Ht 73.0 in | Wt 314.0 lb

## 2012-12-05 DIAGNOSIS — I10 Essential (primary) hypertension: Secondary | ICD-10-CM

## 2012-12-05 DIAGNOSIS — E11339 Type 2 diabetes mellitus with moderate nonproliferative diabetic retinopathy without macular edema: Secondary | ICD-10-CM | POA: Diagnosis not present

## 2012-12-05 DIAGNOSIS — E1139 Type 2 diabetes mellitus with other diabetic ophthalmic complication: Secondary | ICD-10-CM | POA: Diagnosis not present

## 2012-12-05 DIAGNOSIS — E11311 Type 2 diabetes mellitus with unspecified diabetic retinopathy with macular edema: Secondary | ICD-10-CM | POA: Diagnosis not present

## 2012-12-05 DIAGNOSIS — E11349 Type 2 diabetes mellitus with severe nonproliferative diabetic retinopathy without macular edema: Secondary | ICD-10-CM | POA: Diagnosis not present

## 2012-12-05 DIAGNOSIS — R011 Cardiac murmur, unspecified: Secondary | ICD-10-CM

## 2012-12-05 NOTE — Progress Notes (Signed)
HPI The patient presents for yearly followup. Since I last saw him he has had no new cardiovascular complaints. He denies any shortness of breath, PND or orthopnea. He's not been noticing any palpitations, presyncope or syncope. He has had no weight gain or edema. He is somewhat limited by back pain.  However, he can still walk a half mile without any chest pain or shortness of breath. He has been found to have thyroid nodules when we did carotid ultrasound and. These have been biopsied and malignancy cannot be excluded. He's due to have surgery to resect these and this is preoperative clearance.  Allergies  Allergen Reactions  . Phenothiazines Anaphylaxis  . Pioglitazone Swelling    Current Outpatient Prescriptions  Medication Sig Dispense Refill  . acarbose (PRECOSE) 100 MG tablet Take 1 tablet (100 mg total) by mouth 3 (three) times daily with meals.  90 tablet  4  . aspirin 81 MG tablet Take 81 mg by mouth daily.        . citalopram (CELEXA) 20 MG tablet Take 1 tablet (20 mg total) by mouth daily.  30 tablet  5  . furosemide (LASIX) 40 MG tablet Take 1 tablet (40 mg total) by mouth daily.  30 tablet  5  . glyBURIDE (DIABETA) 5 MG tablet Take 1 tablet (5 mg total) by mouth 2 (two) times daily.  60 tablet  1  . insulin detemir (LEVEMIR) 100 UNIT/ML injection Inject 0.8 mLs (80 Units total) into the skin 2 (two) times daily.  10 mL  5  . levothyroxine (SYNTHROID, LEVOTHROID) 88 MCG tablet Take 1 tablet (88 mcg total) by mouth daily.  30 tablet  5  . Linagliptin-Metformin HCl 2.5-500 MG TABS Take 1 tablet by mouth 2 (two) times daily.  60 tablet  0  . lisinopril (PRINIVIL,ZESTRIL) 20 MG tablet Take 0.5 tablets (10 mg total) by mouth daily.  30 tablet  5  . Multiple Vitamins-Minerals (MENS MULTI VITAMIN & MINERAL PO) Take 1 tablet by mouth daily.        . Omega-3 Fatty Acids (FISH OIL) 1200 MG CAPS Take 1-2 capsules by mouth 2 (two) times daily. 1 capsule by mouth q am, 2 capsules by mouth q  pm      . primidone (MYSOLINE) 50 MG tablet 1/2 pill each bedtime x 2 weeks, then 1 pill nightly x 2 weeks, then 1 1/2 pills nightly x 2 weeks, then 2 pills nightly thereafter.  60 tablet  3  . simvastatin (ZOCOR) 40 MG tablet Take 1 tablet (40 mg total) by mouth at bedtime.  30 tablet  5  . FREESTYLE LITE test strip TEST FOUR TIMES DAILY  100 each  2   No current facility-administered medications for this visit.    Past Medical History  Diagnosis Date  . HYPERTENSION 01/22/2009  . 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 BPH (benign prostatic hypertrophy) 04/15/2010  . DEPRESSION 01/22/2009  . HYPERCHOLESTEROLEMIA 01/22/2009  . 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 Morbid obesity   . Heart valve replaced   . Colon polyps   . Ulcer   . Heart murmur     Past Surgical History  Procedure Laterality Date  . Appendectomy    . Aortic valve replacement  July 2006    #20 stentless [place] porcine valve  . Dental surgery  05/2004    Dental extractions  . Heel spur surgery      resection of heel spur  . Colonoscopy  04/24/2007    Christella Hartigan:  normal  . Tonsillectomy    . Bilateral cataract surg    . Colonoscopy with esophagogastroduodenoscopy (egd) N/A 04/05/2012    Procedure: COLONOSCOPY WITH ESOPHAGOGASTRODUODENOSCOPY (EGD);  Surgeon: Corbin Ade, MD;  Location: [facility];  Service: Endoscopy;  Laterality: N/A;  10;15   ROS:   As stated in the HPI and negative for all other systems.  PHYSICAL EXAM BP 123/70  Pulse 79  Ht 6\' 1"  (1.854 m)  Wt 314 lb (142.429 kg)  BMI 41.44 kg/m2 GENERAL:  Well appearing NECK:  No jugular venous distention, waveform within normal limits, carotid upstroke brisk and symmetric, no bruits, no thyromegaly LUNGS:  Clear to auscultation bilaterally CHEST:  Well healed sternotomy scar. HEART:  PMI not displaced or sustained,S1 and S2 within normal limits, no S3, no S4 no clicks, no rubs, systolic murmur mid peaking and  radiating into the carotids  ABD:  Flat,  positive bowel sounds normal in frequency in pitch, no bruits, no rebound, no guarding, no midline pulsatile mass, no hepatomegaly, no splenomegaly, obese EXT:  2 plus pulses throughout, trace edema, no cyanosis no clubbing   EKG:  Sinus rhythm, rate 73,  LAFB, no acute ST T wave changes.  12/05/2012  ASSESSMENT AND PLAN  PREOP- The patient has no high-risk symptoms or findings. He is going for a surgery that is not high risk from a cardiovascular standpoint. He has a high functional level. (greater than 5 METS).  Therefore, based on ACC/AHA guidelines, the patient would be at acceptable risk for the planned procedure without further cardiovascular testing.  AORTIC VALVE REPLACEMENT, HX OF - He had stable valve replacement on the last echo in 2011. No change in therapy is indicated.  No further imaging is indicated.   HYPERTENSION -  The blood pressure is at target. No change in medications is indicated. We will continue with therapeutic lifestyle changes (TLC).   CAROTID OCCLUSIVE DISEASE -  He has less than 50% plaque on the right and probably in the spring with another Doppler

## 2012-12-05 NOTE — Patient Instructions (Signed)
The current medical regimen is effective;  continue present plan and medications.  Follow up in 1 year with Dr Hochrein.  You will receive a letter in the mail 2 months before you are due.  Please call us when you receive this letter to schedule your follow up appointment.  

## 2012-12-06 ENCOUNTER — Telehealth: Payer: Self-pay | Admitting: Pharmacist

## 2012-12-06 MED ORDER — LINAGLIPTIN-METFORMIN HCL 2.5-850 MG PO TABS: 1.0000 | ORAL_TABLET | Freq: Two times a day (BID) | ORAL | Status: DC

## 2012-12-06 NOTE — Telephone Encounter (Signed)
Patient is tolerating jentadueto 2.5/500mg  bid well.  BG is 120 to 130 in am.  He would like enough samples to get to appt 12/24/12.   I didn't have any 2.5/500mg  samples so will increase to 2.5/850mg  1 tablet bid with food Patient notified.

## 2012-12-17 ENCOUNTER — Telehealth: Payer: Self-pay | Admitting: Family Medicine

## 2012-12-17 ENCOUNTER — Other Ambulatory Visit: Payer: Self-pay | Admitting: *Deleted

## 2012-12-17 DIAGNOSIS — I10 Essential (primary) hypertension: Secondary | ICD-10-CM

## 2012-12-17 MED ORDER — FUROSEMIDE 40 MG PO TABS: 40.0000 mg | ORAL_TABLET | Freq: Every day | ORAL | Status: DC

## 2012-12-17 MED ORDER — LISINOPRIL 20 MG PO TABS: 20.0000 mg | ORAL_TABLET | Freq: Every day | ORAL | Status: DC

## 2012-12-17 MED ORDER — GLUCOSE BLOOD VI STRP: ORAL_STRIP | Status: DC

## 2012-12-24 ENCOUNTER — Ambulatory Visit (INDEPENDENT_AMBULATORY_CARE_PROVIDER_SITE_OTHER): Payer: Medicare Other | Admitting: Pharmacist

## 2012-12-24 ENCOUNTER — Encounter: Payer: Self-pay | Admitting: Pharmacist

## 2012-12-24 VITALS — BP 128/68 | HR 78 | Ht 73.0 in | Wt 309.0 lb

## 2012-12-24 DIAGNOSIS — E109 Type 1 diabetes mellitus without complications: Secondary | ICD-10-CM | POA: Diagnosis not present

## 2012-12-24 DIAGNOSIS — I10 Essential (primary) hypertension: Secondary | ICD-10-CM

## 2012-12-24 NOTE — Progress Notes (Signed)
Diabetes Follow-Up Visit Chief Complaint:   Chief Complaint  Patient presents with  . Diabetes     Filed Vitals:   12/24/12 1053  BP: 128/68  Pulse: 78   Filed Weights   12/24/12 1053  Weight: 309 lb (140.161 kg)     HPI: Long standing type 2DM with family history of DM.  Patient's last 2 A1c's have been 8.0%.  He previously had well controlled DM with A1C's in the 6 to 7 range.  Began to get worse when metformin stopped in January 2014 due to Scr of 1.45.  Scr since has been normal.  Last Scr was 1.18 on 11/02/2012.  Patient was seen about 1 month ago by clinical pharmacist and Ramon Dredge was   Current Diabetes Medications:  Glyburide 5mg  bid, acarbose (Precose) 100mg  tid prior to meals, Jentadueto 2.5/850mg  BID and Levemir 80 units bid  Low fat/carbohydrate diet?  No Nicotine Abuse?  No Medication Compliance?  Yes Exercise?  No Alcohol Abuse?  No  Home BG Monitoring:  Checking 2 times a day. High: 150   Low:  105   Exam Edema:  Trace bilaterally  Polyuria:  negative  Polydipsia:  ngative Polyphagia:  negative  BMI:  Body mass index is 40.78 kg/(m^2).    General Appearance:  alert, oriented, no acute distress and obese Mood/Affect:  normal    Lab Results  Component Value Date   HGBA1C 8.0% 11/02/2012    Lab Results  Component Value Date   MICROALBUR 1.50 04/16/2012    LIPID Panel from 11/02/2012:  LDL-P was 1516, TG was 156, HDL was 40 and LDL-C was 70.  TSH = WNL  Microalbumin negative 04/16/2012  Assessment: 1.  Diabetes.  Uncontrolled but much improved since started Jentadueta 2.5/500mg  bid 2.  Blood Pressure.  controlled 3.  Lipids.  Elevated LDL - poor diet 4. History of elevated Scr  Recommendations: 1.  Medication recommendations at this time are as follows:    Continue Jentadueta 2.5/850mg  1 tablet BID with food - checking BMET / SCr today - pending  Consider changing acarbose to incretin mimetic in future but need to see what is going on  with increased thyroid nodules.  Patient has appt to see Dr Gerrit Friends about this 2.  Reviewed HBG goals:  Fasting 80-130 and 1-2 hour post prandial <180.  Patient is instructed to check BG 2 times per day.    3.  BP goal < 140/80. 4.  LDL goal of < 100, HDL > 40 and TG < 150. 5.  Eye Exam yearly and Dental Exam every 6 months. 6.  Dietary recommendations:  Discusses ADA carb counting diet and about limiting fats in depth 7.  Physical Activity recommendations:  Increase activity as able - limited by back pain    Time spent counseling patient:  20 minutes  Referring provider:  Pam Drown, PharmD, CPP

## 2012-12-25 ENCOUNTER — Telehealth: Payer: Self-pay | Admitting: Neurology

## 2012-12-25 LAB — BMP8+EGFR
CO2: 29 mmol/L (ref 18–29)
Calcium: 9.2 mg/dL (ref 8.6–10.2)
Creatinine, Ser: 1.18 mg/dL (ref 0.76–1.27)
Glucose: 244 mg/dL — ABNORMAL HIGH (ref 65–99)
Potassium: 5.1 mmol/L (ref 3.5–5.2)
Sodium: 137 mmol/L (ref 134–144)

## 2012-12-25 NOTE — Telephone Encounter (Addendum)
Shared information with patient per Dr Caro Laroche that  he is taking medication according to instructions, and has not made any medication changes lately. Patient said that he will continue taking and will call back if any other concerns,questions.

## 2012-12-25 NOTE — Telephone Encounter (Signed)
Patient said that he has been having unusual dreams since starting the primidone medicine, happens about twice nightly. Is this normal?

## 2012-12-25 NOTE — Telephone Encounter (Signed)
Please advise patient that any medication that affects the central nervous system can cause changes in sleep and and dreaming. This is not a sinister side effect. Unless this is really bothering [name] we do not have to change anything. Please ask how much the dose of primidone is at this time. According to the titration schedule he should be on 100 mg each night. Also has he had any other medication changes lately?

## 2012-12-26 ENCOUNTER — Telehealth: Payer: Self-pay | Admitting: Family Medicine

## 2012-12-26 MED ORDER — LINAGLIPTIN-METFORMIN HCL 2.5-1000 MG PO TABS: 1.0000 | ORAL_TABLET | Freq: Two times a day (BID) | ORAL | Status: DC

## 2012-12-26 NOTE — Telephone Encounter (Signed)
Patient called about recent labs.  BG was elevated - increased linagluptin/metformin to 2.5/1000mg  1 tablet bid

## 2012-12-27 NOTE — Telephone Encounter (Signed)
Okay to reduce primidone to 1-1/2 pills each night. If no improvement in possible nightmares or palpitations, reduce further to one pill each night.

## 2012-12-27 NOTE — Telephone Encounter (Signed)
Shared Dr Teofilo Pod message with patient, he verbalized understanding

## 2012-12-27 NOTE — Telephone Encounter (Signed)
Patient says last night he did not have any nightmares but he woke up out of his sleep with a racing heartbeat. He wanted to notify Dr. Frances Furbish of this new side effect and ask if Primidone can be decreased to back  1.5 tablet daily rather than 2 tablets.

## 2012-12-27 NOTE — Telephone Encounter (Signed)
Patient returning call. Attempted to get in contact with Ammie to transfer him but was unable to reach her. Please call the patient back.

## 2013-01-01 DIAGNOSIS — N4889 Other specified disorders of penis: Secondary | ICD-10-CM | POA: Diagnosis not present

## 2013-01-01 DIAGNOSIS — R351 Nocturia: Secondary | ICD-10-CM | POA: Diagnosis not present

## 2013-01-01 DIAGNOSIS — R3915 Urgency of urination: Secondary | ICD-10-CM | POA: Diagnosis not present

## 2013-01-01 DIAGNOSIS — R39198 Other difficulties with micturition: Secondary | ICD-10-CM | POA: Diagnosis not present

## 2013-01-03 DIAGNOSIS — R609 Edema, unspecified: Secondary | ICD-10-CM | POA: Diagnosis not present

## 2013-01-03 DIAGNOSIS — G250 Essential tremor: Secondary | ICD-10-CM | POA: Diagnosis not present

## 2013-01-03 DIAGNOSIS — M5137 Other intervertebral disc degeneration, lumbosacral region: Secondary | ICD-10-CM | POA: Diagnosis not present

## 2013-01-03 DIAGNOSIS — E039 Hypothyroidism, unspecified: Secondary | ICD-10-CM | POA: Diagnosis not present

## 2013-01-03 DIAGNOSIS — I10 Essential (primary) hypertension: Secondary | ICD-10-CM | POA: Diagnosis not present

## 2013-01-03 DIAGNOSIS — Z794 Long term (current) use of insulin: Secondary | ICD-10-CM | POA: Diagnosis not present

## 2013-01-03 DIAGNOSIS — E785 Hyperlipidemia, unspecified: Secondary | ICD-10-CM | POA: Diagnosis not present

## 2013-01-03 DIAGNOSIS — E119 Type 2 diabetes mellitus without complications: Secondary | ICD-10-CM | POA: Diagnosis not present

## 2013-01-03 DIAGNOSIS — M47817 Spondylosis without myelopathy or radiculopathy, lumbosacral region: Secondary | ICD-10-CM | POA: Diagnosis not present

## 2013-01-03 DIAGNOSIS — F329 Major depressive disorder, single episode, unspecified: Secondary | ICD-10-CM | POA: Diagnosis not present

## 2013-01-03 DIAGNOSIS — Z79899 Other long term (current) drug therapy: Secondary | ICD-10-CM | POA: Diagnosis not present

## 2013-01-03 DIAGNOSIS — F3289 Other specified depressive episodes: Secondary | ICD-10-CM | POA: Diagnosis not present

## 2013-01-03 DIAGNOSIS — Z888 Allergy status to other drugs, medicaments and biological substances status: Secondary | ICD-10-CM | POA: Diagnosis not present

## 2013-01-03 DIAGNOSIS — Z7982 Long term (current) use of aspirin: Secondary | ICD-10-CM | POA: Diagnosis not present

## 2013-01-03 DIAGNOSIS — Z954 Presence of other heart-valve replacement: Secondary | ICD-10-CM | POA: Diagnosis not present

## 2013-01-07 ENCOUNTER — Telehealth: Payer: Self-pay | Admitting: Neurology

## 2013-01-07 ENCOUNTER — Telehealth (INDEPENDENT_AMBULATORY_CARE_PROVIDER_SITE_OTHER): Payer: Self-pay

## 2013-01-07 NOTE — Telephone Encounter (Signed)
Message copied by Joanette Gula on Mon Jan 07, 2013 10:32 AM ------      Message from: Zacarias Pontes      Created: Mon Jan 07, 2013  9:43 AM       Pt called to ask if you have received cardiac clearance.[name] KitchenHas been over a  month and he hasnt heard from anyone.[name] KitchenMarland KitchenMarland [phone] ------

## 2013-01-07 NOTE — Telephone Encounter (Signed)
Pt advised clearance is in epic note from cardiologist and orders are with surgery schedulers to call pt to set up date. Pt advised to d/c aspirin and fish oil 5 days pre op.

## 2013-01-07 NOTE — Telephone Encounter (Signed)
Informed patient of Dr Teofilo Pod note, patient verbalized understanding and said that he will if it works for him

## 2013-01-07 NOTE — Telephone Encounter (Signed)
Patient said that he is suppose to call when he has taken his last dosage of the premidone

## 2013-01-07 NOTE — Telephone Encounter (Signed)
If he is able to tolerate one pill each night of primidone we can continue with that if it helps his tremors - please advise patient.

## 2013-01-14 ENCOUNTER — Telehealth: Payer: Self-pay | Admitting: Family Medicine

## 2013-01-14 NOTE — Telephone Encounter (Signed)
Patient's BG has been 80's to 120's.   Recommend decrease Levemir 70 units twice.  Continue to check BG 2-3 times daily.  Reviewed how to treat hypoglycemia.

## 2013-01-15 ENCOUNTER — Other Ambulatory Visit: Payer: Self-pay | Admitting: Pharmacist Clinician (PhC)/ Clinical Pharmacy Specialist

## 2013-01-15 DIAGNOSIS — E119 Type 2 diabetes mellitus without complications: Secondary | ICD-10-CM

## 2013-01-15 MED ORDER — INSULIN DETEMIR 100 UNIT/ML ~~LOC~~ SOLN: 50.0000 [IU] | Freq: Two times a day (BID) | SUBCUTANEOUS | Status: DC

## 2013-01-15 NOTE — Telephone Encounter (Signed)
Pt having drops in blood sugar Has med change x 1 mth Please review and advise

## 2013-01-15 NOTE — Telephone Encounter (Signed)
Michelle spoke with pt.

## 2013-01-15 NOTE — Telephone Encounter (Signed)
Patient called with hypoglycemia since starting Jentudueta.  BG fasting readings are 50-89mg /dL.  Patient was instructed to cut Levemir to 50 units bid since the decrease from 80-70units did not produce much benefit.  He will call tomorrow with any problems.

## 2013-01-25 DIAGNOSIS — R3915 Urgency of urination: Secondary | ICD-10-CM | POA: Diagnosis not present

## 2013-01-25 DIAGNOSIS — B356 Tinea cruris: Secondary | ICD-10-CM | POA: Diagnosis not present

## 2013-01-25 DIAGNOSIS — R39198 Other difficulties with micturition: Secondary | ICD-10-CM | POA: Diagnosis not present

## 2013-01-25 DIAGNOSIS — N4889 Other specified disorders of penis: Secondary | ICD-10-CM | POA: Diagnosis not present

## 2013-01-28 ENCOUNTER — Other Ambulatory Visit: Payer: Self-pay | Admitting: *Deleted

## 2013-01-28 DIAGNOSIS — E1159 Type 2 diabetes mellitus with other circulatory complications: Secondary | ICD-10-CM | POA: Diagnosis not present

## 2013-01-28 DIAGNOSIS — E785 Hyperlipidemia, unspecified: Secondary | ICD-10-CM

## 2013-01-28 DIAGNOSIS — E119 Type 2 diabetes mellitus without complications: Secondary | ICD-10-CM | POA: Diagnosis not present

## 2013-01-28 DIAGNOSIS — E1149 Type 2 diabetes mellitus with other diabetic neurological complication: Secondary | ICD-10-CM | POA: Diagnosis not present

## 2013-01-28 MED ORDER — SIMVASTATIN 40 MG PO TABS
40.0000 mg | ORAL_TABLET | Freq: Every day | ORAL | Status: DC
Start: 2013-01-28 — End: 2013-05-08

## 2013-01-29 ENCOUNTER — Telehealth: Payer: Self-pay | Admitting: Family Medicine

## 2013-01-30 ENCOUNTER — Other Ambulatory Visit (HOSPITAL_COMMUNITY): Payer: Medicare Other

## 2013-01-30 MED ORDER — GLUCOSE BLOOD VI STRP: ORAL_STRIP | Status: DC

## 2013-01-30 NOTE — Telephone Encounter (Signed)
Ok to increase blood glucose testing to qid if needed.  BG was running low but is better now since adjusted insulin.  BG in am now is around 110 and about 120 in pm.

## 2013-01-30 NOTE — Telephone Encounter (Signed)
Pt says he has increased his daily blood sugar testing to qid and needs a new order sent

## 2013-01-31 ENCOUNTER — Telehealth: Payer: Self-pay | Admitting: Family Medicine

## 2013-01-31 DIAGNOSIS — E119 Type 2 diabetes mellitus without complications: Secondary | ICD-10-CM

## 2013-01-31 MED ORDER — GLUCOSE BLOOD VI STRP: ORAL_STRIP | Status: DC

## 2013-01-31 NOTE — Telephone Encounter (Signed)
rx has been given to Dr Jacelyn Grip to sign and then his nurse if going to fax to [facility] - they will not take an erx for glucose testing supplies

## 2013-02-01 DIAGNOSIS — E11349 Type 2 diabetes mellitus with severe nonproliferative diabetic retinopathy without macular edema: Secondary | ICD-10-CM | POA: Diagnosis not present

## 2013-02-01 DIAGNOSIS — E1139 Type 2 diabetes mellitus with other diabetic ophthalmic complication: Secondary | ICD-10-CM | POA: Diagnosis not present

## 2013-02-06 MED ORDER — ACARBOSE 100 MG PO TABS: 100.0000 mg | ORAL_TABLET | Freq: Two times a day (BID) | ORAL | Status: DC

## 2013-02-07 ENCOUNTER — Telehealth: Payer: Self-pay | Admitting: Family Medicine

## 2013-02-07 DIAGNOSIS — M5126 Other intervertebral disc displacement, lumbar region: Secondary | ICD-10-CM | POA: Diagnosis not present

## 2013-02-07 NOTE — Telephone Encounter (Signed)
Patient has question about change in acarbose dose - explained that we received a letter from insurance requesting decrease to 2 tablet per day - his BG is doing well so we will try 2 per day and see how things go.

## 2013-02-14 ENCOUNTER — Telehealth: Payer: Self-pay | Admitting: Family Medicine

## 2013-02-14 NOTE — Telephone Encounter (Signed)
Pt c/o SOB with walking and dizziness x 3 weeks. I advised pt we needed to see him today and he refused to be seen today. I advised if he developed chest pain or got worse go to ER. Pt verbalized understanding. appt made for tom.

## 2013-02-15 ENCOUNTER — Ambulatory Visit (INDEPENDENT_AMBULATORY_CARE_PROVIDER_SITE_OTHER): Payer: Medicare Other | Admitting: Family Medicine

## 2013-02-15 ENCOUNTER — Telehealth (INDEPENDENT_AMBULATORY_CARE_PROVIDER_SITE_OTHER): Payer: Self-pay

## 2013-02-15 ENCOUNTER — Encounter: Payer: Self-pay | Admitting: Family Medicine

## 2013-02-15 VITALS — BP 131/62 | HR 75 | Temp 97.7°F | Ht 73.0 in | Wt 306.6 lb

## 2013-02-15 DIAGNOSIS — J069 Acute upper respiratory infection, unspecified: Secondary | ICD-10-CM | POA: Diagnosis not present

## 2013-02-15 MED ORDER — METHYLPREDNISOLONE (PAK) 4 MG PO TABS: ORAL_TABLET | ORAL | Status: DC

## 2013-02-15 MED ORDER — AZITHROMYCIN 250 MG PO TABS: ORAL_TABLET | ORAL | Status: DC

## 2013-02-15 NOTE — Telephone Encounter (Signed)
LM for pt to call. Pt surgery was moved to Feb but his old PO appt for 1-28 is still in system. We need to cx that appt and r/s for after is Feb surgery.

## 2013-02-15 NOTE — Progress Notes (Signed)
   Subjective:    Patient ID: [name], male    DOB: 01/12/1946, 68 y.o.   MRN: [id number]  HPI This 68 y.o. male presents for evaluation of URI sx's and shortness of breath on exertion.   Review of Systems C/o uri sx's No chest pain, SOB, HA, dizziness, vision change, N/V, diarrhea, constipation, dysuria, urinary urgency or frequency, myalgias, arthralgias or rash.     Objective:   Physical Exam Vital signs noted  Well developed well nourished male.  HEENT - Head atraumatic Normocephalic                Eyes - PERRLA, Conjuctiva - clear Sclera- Clear EOMI                Ears - EAC's Wnl TM's Wnl Gross Hearing WNL Respiratory - Lungs CTA bilateral Cardiac - RRR S1 and S2 without murmur GI - Abdomen soft Nontender and bowel sounds active x 4 Extremities - No edema. Neuro - Grossly intact.       Assessment & Plan:  URI (upper respiratory infection) - Plan: azithromycin (ZITHROMAX) 250 MG tablet, methylPREDNIsolone (MEDROL DOSPACK) 4 MG tablet Push po fluids, rest, tylenol and motrin otc prn as directed for fever, arthralgias, and myalgias.  Follow up prn if sx's continue or persist.  Lysbeth Penner FNP

## 2013-02-18 ENCOUNTER — Telehealth: Payer: Self-pay | Admitting: Family Medicine

## 2013-02-18 NOTE — Telephone Encounter (Signed)
Pt notified that steroid medication will increase blood glucose level Try to increase the amount of water and be mindful of diet while on this medication, check bs regularly

## 2013-02-20 ENCOUNTER — Encounter (INDEPENDENT_AMBULATORY_CARE_PROVIDER_SITE_OTHER): Payer: Medicare Other | Admitting: Surgery

## 2013-02-22 ENCOUNTER — Telehealth: Payer: Self-pay | Admitting: Neurology

## 2013-02-22 NOTE — Telephone Encounter (Signed)
Please ask patient to take primidone one pill each night for one week then half a pill each night for one week then stop.

## 2013-02-22 NOTE — Telephone Encounter (Signed)
Would like to come off of Primidone and would like some direction on how to come off of it

## 2013-02-22 NOTE — Telephone Encounter (Signed)
Called patient and explained to the patient about his medication. Dr. Rexene Alberts stated that she wanted the patient to take 1 pill at night for one week, then a half of pill at night for 1 week, then stop. I advised the patient that if he has any other problems, questions or concerns to call the office. Patient verbalized understanding.

## 2013-02-22 NOTE — Telephone Encounter (Signed)
Patient requesting to discontinue his medication of Primidone and would like some advice on how to do this. Please advise.

## 2013-03-01 ENCOUNTER — Ambulatory Visit: Payer: Medicare Other | Admitting: Neurology

## 2013-03-06 ENCOUNTER — Telehealth: Payer: Self-pay | Admitting: Pharmacist

## 2013-03-06 DIAGNOSIS — E119 Type 2 diabetes mellitus without complications: Secondary | ICD-10-CM

## 2013-03-06 NOTE — Telephone Encounter (Signed)
Patient states that BG is still elevated.  He is unsure of when or who told him to decrease levemir from 80 bid to only 50 units bid.  Instructed to increase Levemir back to 80 units bid and check BG 2-3 times daily.  Call office if not improvement or if experiences hypoglycemia more than once per week

## 2013-03-07 ENCOUNTER — Encounter (HOSPITAL_COMMUNITY): Payer: Self-pay | Admitting: Pharmacy Technician

## 2013-03-13 NOTE — Pre-Procedure Instructions (Signed)
[name]  03/13/2013   Your procedure is scheduled on:  Thursday, March 21, 2013 at 7:30 AM  Report to [facility] (use Main Entrance "A'') at 5;30 AM.  Call this number if you have problems the morning of surgery: [phone]   Remember:   Do not eat food or drink liquids after midnight.   Take these medicines the morning of surgery with A SIP OF WATER: citalopram (CELEXA),  levothyroxine (SYNTHROID, LEVOTHROID), metoprolol tartrate (LOPRESSOR),  Stop taking Aspirin, Multivitamins and herbal medications (Omega-3 Fatty Acids (FISH OIL)  Do not take any NSAIDs ie: Ibuprofen, Advil, Naproxen or any medication containing Aspirin. Stop 5 days prior to procedure.  Do not wear jewelry, make-up or nail polish.  Do not wear lotions, powders, or perfumes. You may wear deodorant.  Do not shave 48 hours prior to surgery. Men may shave face and neck.  Do not bring valuables to the hospital.  [facility] is not responsible for any belongings or valuables.               Contacts, dentures or bridgework may not be worn into surgery.  Leave suitcase in the car. After surgery it may be brought to your room.  For patients admitted to the hospital, discharge time is determined by your treatment team.               Patients discharged the day of surgery will not be allowed to drive home.  Name and phone number of your driver:   Special Instructions:  Special Instructions:Special Instructions: [facility] - Preparing for Surgery  Before surgery, you can play an important role.  Because skin is not sterile, your skin needs to be as free of germs as possible.  You can reduce the number of germs on you skin by washing with CHG (chlorahexidine gluconate) soap before surgery.  CHG is an antiseptic cleaner which kills germs and bonds with the skin to continue killing germs even after washing.  Please DO NOT use if you have an allergy to CHG or antibacterial soaps.  If your skin becomes  reddened/irritated stop using the CHG and inform your nurse when you arrive at Short Stay.  Do not shave (including legs and underarms) for at least 48 hours prior to the first CHG shower.  You may shave your face.  Please follow these instructions carefully:   1.  Shower with CHG Soap the night before surgery and the morning of Surgery.  2.  If you choose to wash your hair, wash your hair first as usual with your normal shampoo.  3.  After you shampoo, rinse your hair and body thoroughly to remove the Shampoo.  4.  Use CHG as you would any other liquid soap.  You can apply chg directly  to the skin and wash gently with scrungie or a clean washcloth.  5.  Apply the CHG Soap to your body ONLY FROM THE NECK DOWN.  Do not use on open wounds or open sores.  Avoid contact with your eyes, ears, mouth and genitals (private parts).  Wash genitals (private parts) with your normal soap.  6.  Wash thoroughly, paying special attention to the area where your surgery will be performed.  7.  Thoroughly rinse your body with warm water from the neck down.  8.  DO NOT shower/wash with your normal soap after using and rinsing off the CHG Soap.  9.  Pat yourself dry with a clean towel.  10.  Wear clean pajamas.            11.  Place clean sheets on your bed the night of your first shower and do not sleep with pets.  Day of Surgery  Do not apply any lotions the morning of surgery.  Please wear clean clothes to the hospital/surgery center.   Please read over the following fact sheets that you were given: Pain Booklet and Surgical Site Infection Prevention

## 2013-03-14 ENCOUNTER — Encounter (HOSPITAL_COMMUNITY)
Admission: RE | Admit: 2013-03-14 | Discharge: 2013-03-14 | Disposition: A | Discharge status: Home / Self Care | Payer: Medicare Other | Source: Ambulatory Visit | Attending: Surgery | Admitting: Surgery

## 2013-03-14 ENCOUNTER — Encounter (HOSPITAL_COMMUNITY): Payer: Self-pay

## 2013-03-14 ENCOUNTER — Other Ambulatory Visit (INDEPENDENT_AMBULATORY_CARE_PROVIDER_SITE_OTHER): Payer: Self-pay | Admitting: Surgery

## 2013-03-14 DIAGNOSIS — Z01818 Encounter for other preprocedural examination: Secondary | ICD-10-CM | POA: Insufficient documentation

## 2013-03-14 DIAGNOSIS — Z01812 Encounter for preprocedural laboratory examination: Secondary | ICD-10-CM | POA: Insufficient documentation

## 2013-03-14 HISTORY — DX: Unspecified osteoarthritis, unspecified site: M19.90

## 2013-03-14 HISTORY — DX: Tremor, unspecified: R25.1

## 2013-03-14 LAB — BASIC METABOLIC PANEL
BUN: 26 mg/dL — ABNORMAL HIGH (ref 6–23)
CO2: 24 mEq/L (ref 19–32)
Calcium: 9.1 mg/dL (ref 8.4–10.5)
Chloride: 105 mEq/L (ref 96–112)
Creatinine, Ser: 1.34 mg/dL (ref 0.50–1.35)
GFR calc Af Amer: 61 mL/min — ABNORMAL LOW (ref >90)
GFR calc non Af Amer: 52 mL/min — ABNORMAL LOW (ref >90)
GLUCOSE: 158 mg/dL — AB (ref 70–99)
Potassium: 4.9 mEq/L (ref 3.7–5.3)
Sodium: 145 mEq/L (ref 137–147)

## 2013-03-14 LAB — CBC
HCT: 41 % (ref 39.0–52.0)
HEMOGLOBIN: 13.8 g/dL (ref 13.0–17.0)
MCH: 33.5 pg (ref 26.0–34.0)
MCHC: 33.7 g/dL (ref 30.0–36.0)
MCV: 99.5 fL (ref 78.0–100.0)
Platelets: 146 10*3/uL — ABNORMAL LOW (ref 150–400)
RBC: 4.12 MIL/uL — ABNORMAL LOW (ref 4.22–5.81)
RDW: 14.9 % (ref 11.5–15.5)
WBC: 7.1 10*3/uL (ref 4.0–10.5)

## 2013-03-14 NOTE — Progress Notes (Signed)
Quick Note:  These results are acceptable for scheduled surgery.  Jameil Whitmoyer M. Sumedh Shinsato, MD, FACS [facility] Office: [phone]   ______ 

## 2013-03-15 ENCOUNTER — Encounter: Payer: Self-pay | Admitting: Family Medicine

## 2013-03-15 ENCOUNTER — Ambulatory Visit (INDEPENDENT_AMBULATORY_CARE_PROVIDER_SITE_OTHER): Payer: Medicare Other | Admitting: Family Medicine

## 2013-03-15 VITALS — BP 113/59 | HR 71 | Temp 97.0°F | Ht 73.0 in | Wt 307.0 lb

## 2013-03-15 DIAGNOSIS — E890 Postprocedural hypothyroidism: Secondary | ICD-10-CM | POA: Diagnosis not present

## 2013-03-15 DIAGNOSIS — D449 Neoplasm of uncertain behavior of unspecified endocrine gland: Secondary | ICD-10-CM

## 2013-03-15 DIAGNOSIS — E042 Nontoxic multinodular goiter: Secondary | ICD-10-CM

## 2013-03-15 DIAGNOSIS — N400 Enlarged prostate without lower urinary tract symptoms: Secondary | ICD-10-CM

## 2013-03-15 DIAGNOSIS — E109 Type 1 diabetes mellitus without complications: Secondary | ICD-10-CM | POA: Diagnosis not present

## 2013-03-15 DIAGNOSIS — F3289 Other specified depressive episodes: Secondary | ICD-10-CM

## 2013-03-15 DIAGNOSIS — I10 Essential (primary) hypertension: Secondary | ICD-10-CM | POA: Diagnosis not present

## 2013-03-15 DIAGNOSIS — E7800 Pure hypercholesterolemia, unspecified: Secondary | ICD-10-CM | POA: Diagnosis not present

## 2013-03-15 DIAGNOSIS — D649 Anemia, unspecified: Secondary | ICD-10-CM

## 2013-03-15 DIAGNOSIS — K219 Gastro-esophageal reflux disease without esophagitis: Secondary | ICD-10-CM

## 2013-03-15 DIAGNOSIS — D440 Neoplasm of uncertain behavior of thyroid gland: Secondary | ICD-10-CM

## 2013-03-15 DIAGNOSIS — I6529 Occlusion and stenosis of unspecified carotid artery: Secondary | ICD-10-CM | POA: Diagnosis not present

## 2013-03-15 DIAGNOSIS — E669 Obesity, unspecified: Secondary | ICD-10-CM

## 2013-03-15 DIAGNOSIS — Z954 Presence of other heart-valve replacement: Secondary | ICD-10-CM

## 2013-03-15 DIAGNOSIS — F329 Major depressive disorder, single episode, unspecified: Secondary | ICD-10-CM

## 2013-03-15 DIAGNOSIS — R011 Cardiac murmur, unspecified: Secondary | ICD-10-CM

## 2013-03-15 DIAGNOSIS — M720 Palmar fascial fibromatosis [Dupuytren]: Secondary | ICD-10-CM

## 2013-03-15 LAB — POCT GLYCOSYLATED HEMOGLOBIN (HGB A1C): Hemoglobin A1C: 7.3

## 2013-03-15 NOTE — Patient Instructions (Signed)
      Dr Lashann Hagg's Recommendations  For nutrition information, I recommend books:  1).Eat to Live by Dr Joel Fuhrman. 2).Prevent and Reverse Heart Disease by Dr Caldwell Esselstyn. 3) Dr Neal Barnard's Book:  Program to Reverse Diabetes  Exercise recommendations are:  If unable to walk, then the patient can exercise in a chair 3 times a day. By flapping arms like a bird gently and raising legs outwards to the front.  If ambulatory, the patient can go for walks for 30 minutes 3 times a week. Then increase the intensity and duration as tolerated.  Goal is to try to attain exercise frequency to 5 times a week.  If applicable: Best to perform resistance exercises (machines or weights) 2 days a week and cardio type exercises 3 days per week.  

## 2013-03-15 NOTE — Progress Notes (Signed)
Patient ID: [name], male   DOB: [birth year], 68 y.o.   MRN: [id number] SUBJECTIVE: CC: Chief Complaint  Patient presents with  . Follow-up    3 month follow up fasting    HPI: Patient is here for follow up of Diabetes Mellitu/HTN/Hypercholesterolemias: Symptoms evaluated: Denies Nocturia ,Denies Urinary Frequency , denies Blurred vision ,deniesDizziness,denies.Dysuria,denies paresthesias, denies extremity pain or ulcers.Marland Kitchendenies chest pain. has had an annual eye exam. do check the feet. Does check CBGs. Average CBG:130 this am. Denies episodes of hypoglycemia. Does have an emergency hypoglycemic plan. admits toCompliance with medications. Denies Problems with medications.   Scheduled for thyroid surgery with Dr [name] in 6 days.  Other problems stable.  Past Medical History  Diagnosis Date  . 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 BPH (benign prostatic hypertrophy) 04/15/2010  . DEPRESSION 01/22/2009  . HYPERCHOLESTEROLEMIA 01/22/2009  . 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 Morbid obesity   . Heart valve replaced   . Colon polyps   . Ulcer   . Heart murmur   . HYPERTENSION 01/22/2009    dr Casimer Lanius  . Arthritis   . Tremors of nervous system     ?ptsd   Past Surgical History  Procedure Laterality Date  . Appendectomy    . Aortic valve replacement  July 2006    #20 stentless [place] porcine valve  . Dental surgery  05/2004    Dental extractions  . Heel spur surgery      resection of heel spur  . Colonoscopy  04/24/2007    [name]: normal  . Tonsillectomy    . Bilateral cataract surg    . Colonoscopy with esophagogastroduodenoscopy (egd) N/A 04/05/2012    Procedure: COLONOSCOPY WITH ESOPHAGOGASTRODUODENOSCOPY (EGD);  Surgeon: Daneil Dolin, MD;  Location: [facility];  Service: Endoscopy;  Laterality: N/A;  10;15   History   Social History  . Marital Status: Single    Spouse Name: N/A    Number of Children: 1  . Years of Education: HS   Occupational History   . Truck Geophysicist/field seismologist, retired    Social History Main Topics  . Smoking status: Former Smoker    Quit date: 09/20/1961  . Smokeless tobacco: Never Used  . Alcohol Use: No  . Drug Use: No  . Sexual Activity: Not on file   Other Topics Concern  . Not on file   Social History Narrative   Lives alone.   Quit smoking approximately 28 years ago.   Long haul truck driver, lives in [place].   Family History  Problem Relation Age of Onset  . Diabetes     Current Outpatient Prescriptions on File Prior to Visit  Medication Sig Dispense Refill  . acarbose (PRECOSE) 100 MG tablet Take 1 tablet (100 mg total) by mouth 2 (two) times daily.  180 tablet  1  . aspirin 81 MG tablet Take 81 mg by mouth daily.        . citalopram (CELEXA) 20 MG tablet Take 1 tablet (20 mg total) by mouth daily.  30 tablet  5  . furosemide (LASIX) 40 MG tablet Take 1 tablet (40 mg total) by mouth daily.  30 tablet  4  . glyBURIDE (DIABETA) 5 MG tablet Take 1 tablet (5 mg total) by mouth 2 (two) times daily.  60 tablet  1  . insulin detemir (LEVEMIR) 100 UNIT/ML injection Inject 0.8 mLs (80 Units total) into the skin 2 (two) times daily.  10 mL  5  . levothyroxine (SYNTHROID, LEVOTHROID) 88  MCG tablet Take 1 tablet (88 mcg total) by mouth daily.  30 tablet  5  . Linagliptin-Metformin HCl 2.05-998 MG TABS Take 1 tablet by mouth 2 (two) times daily.  70 tablet  0  . lisinopril (PRINIVIL,ZESTRIL) 20 MG tablet Take 1 tablet (20 mg total) by mouth daily.  30 tablet  4  . metoprolol tartrate (LOPRESSOR) 25 MG tablet Take 12.5 mg by mouth 2 (two) times daily.       . Multiple Vitamins-Minerals (MENS MULTI VITAMIN & MINERAL PO) Take 1 tablet by mouth daily.        Marland Kitchen nystatin (MYCOSTATIN) powder Apply 1 g topically daily as needed (rash).       . Omega-3 Fatty Acids (FISH OIL) 1200 MG CAPS Take 1-2 capsules by mouth 2 (two) times daily. 1 capsule by mouth q am, 2 capsules by mouth q pm      . simvastatin (ZOCOR) 40 MG tablet Take  1 tablet (40 mg total) by mouth at bedtime.  30 tablet  4   No current facility-administered medications on file prior to visit.   Allergies  Allergen Reactions  . Phenothiazines Anaphylaxis  . Pioglitazone Swelling   Immunization History  Administered Date(s) Administered  . Influenza Whole 09/24/2008  . Influenza,inj,Quad PF,36+ Mos 11/02/2012   Prior to Admission medications   Medication Sig Start Date End Date Taking? Authorizing Provider  acarbose (PRECOSE) 100 MG tablet Take 1 tablet (100 mg total) by mouth 2 (two) times daily. 02/06/13  Yes Vernie Shanks, MD  aspirin 81 MG tablet Take 81 mg by mouth daily.     Yes Historical Provider, MD  citalopram (CELEXA) 20 MG tablet Take 1 tablet (20 mg total) by mouth daily. 04/16/12  Yes Gearlean Alf, PA-C  furosemide (LASIX) 40 MG tablet Take 1 tablet (40 mg total) by mouth daily. 12/17/12  Yes Vernie Shanks, MD  glyBURIDE (DIABETA) 5 MG tablet Take 1 tablet (5 mg total) by mouth 2 (two) times daily. 10/17/12  Yes Chipper Herb, MD  insulin detemir (LEVEMIR) 100 UNIT/ML injection Inject 0.8 mLs (80 Units total) into the skin 2 (two) times daily. 03/06/13  Yes Tammy Eckard, PHARMD  levothyroxine (SYNTHROID, LEVOTHROID) 88 MCG tablet Take 1 tablet (88 mcg total) by mouth daily. 04/16/12  Yes Gearlean Alf, PA-C  Linagliptin-Metformin HCl 2.05-998 MG TABS Take 1 tablet by mouth 2 (two) times daily. 12/26/12  Yes Tammy Eckard, PHARMD  lisinopril (PRINIVIL,ZESTRIL) 20 MG tablet Take 1 tablet (20 mg total) by mouth daily. 12/17/12  Yes Vernie Shanks, MD  metoprolol tartrate (LOPRESSOR) 25 MG tablet Take 12.5 mg by mouth 2 (two) times daily.  01/25/13  Yes Historical Provider, MD  Multiple Vitamins-Minerals (MENS MULTI VITAMIN & MINERAL PO) Take 1 tablet by mouth daily.     Yes Historical Provider, MD  nystatin (MYCOSTATIN) powder Apply 1 g topically daily as needed (rash).  01/25/13  Yes Historical Provider, MD  Omega-3 Fatty Acids (FISH OIL) 1200  MG CAPS Take 1-2 capsules by mouth 2 (two) times daily. 1 capsule by mouth q am, 2 capsules by mouth q pm   Yes Historical Provider, MD  simvastatin (ZOCOR) 40 MG tablet Take 1 tablet (40 mg total) by mouth at bedtime. 01/28/13  Yes Vernie Shanks, MD     ROS: As above in the HPI. All other systems are stable or negative.  OBJECTIVE: APPEARANCE:  Patient in no acute distress.The patient appeared well nourished and  normally developed. Acyanotic. Waist: VITAL SIGNS:BP 113/59  Pulse 71  Temp(Src) 97 F (36.1 C) (Oral)  Ht 6\' 1"  (1.854 m)  Wt 307 lb (139.254 kg)  BMI 40.51 kg/m2  Obese WM   SKIN: warm and  Dry without overt rashes, tattoos and scars  HEAD and Neck: without JVD, Head and scalp: normal Eyes:No scleral icterus. Fundi normal, eye movements normal. Ears: Auricle normal, canal normal, Tympanic membranes normal, insufflation normal. Nose: normal Throat: normal Neck: short neck supple Thyroid: enlarged Goiter.  CHEST & LUNGS: Chest wall: normal Lungs: Clear  CVS: Reveals the PMI to be normally located. Regular rhythm, First and Second Heart sounds are normal,  absence of murmurs, rubs or gallops. Peripheral vasculature: Radial pulses: normal Dorsal pedis pulses: normal Posterior pulses: normal  ABDOMEN:  Appearance: Obese Benign, no organomegaly, no masses, no Abdominal Aortic enlargement. No Guarding , no rebound. No Bruits. Bowel sounds: normal  RECTAL: N/A GU: N/A  EXTREMETIES: nonedematous.  MUSCULOSKELETAL:  Spine: normal Joints: dupuytren's contractures of the hands  NEUROLOGIC: oriented to time,place and person; nonfocal. Strength is normal Sensory is normal Reflexes are normal Cranial Nerves are normal. Results for orders placed in visit on 03/15/13  POCT GLYCOSYLATED HEMOGLOBIN (HGB A1C)      Result Value Ref Range   Hemoglobin A1C 7.3      ASSESSMENT: Obesity  IDDM - Plan: POCT glycosylated hemoglobin (Hb A1C)  Neoplasm of  uncertain behavior of thyroid gland, right lobe  HYPOTHYROIDISM, POST-RADIATION  HYPERTENSION  HYPERCHOLESTEROLEMIA - Plan: Hepatic function panel, Lipid panel  CAROTID OCCLUSIVE DISEASE  AORTIC VALVE REPLACEMENT, HX OF  MURMUR  GOITER, MULTINODULAR  GERD (gastroesophageal reflux disease)  Dupuytren's contracture of both hands  DEPRESSION  BPH (benign prostatic hypertrophy)  Anemia  PLAN:      Dr Paula Libra Recommendations  For nutrition information, I recommend books:  1).Eat to Live by Dr Excell Seltzer. 2).Prevent and Reverse Heart Disease by Dr Karl Luke. 3) Dr Janene Harvey Book:  Program to Reverse Diabetes  Exercise recommendations are:  If unable to walk, then the patient can exercise in a chair 3 times a day. By flapping arms like a bird gently and raising legs outwards to the front.  If ambulatory, the patient can go for walks for 30 minutes 3 times a week. Then increase the intensity and duration as tolerated.  Goal is to try to attain exercise frequency to 5 times a week.  If applicable: Best to perform resistance exercises (machines or weights) 2 days a week and cardio type exercises 3 days per week.  Orders Placed This Encounter  Procedures  . Hepatic function panel  . Lipid panel  . POCT glycosylated hemoglobin (Hb A1C)  advised him on core strengthening exercise,ie, crunches. Diet and exercise. No orders of the defined types were placed in this encounter.   There are no discontinued medications. Return in about 3 months (around 06/12/2013) for Recheck medical problems.  Heywood Tokunaga P. Jacelyn Grip, M.D.

## 2013-03-15 NOTE — Progress Notes (Signed)
Anesthesia Chart Review:  Patient is a 68 year old male scheduled for thyroidectomy on 03/21/13 by Dr. [name].  He has multiple thyroid nodules with evidence of atypia.  Other history includes severe AS s/p AVR (21 mm [place] stentless porcine) 08/19/04, former smoker, DM2, BPH, depression, tremors, HTN, hypothyroidism post radioactive iodine for hyperthyroidism.  BMI is 40.51 consistent with morbid obesity. Cardiologist is Dr. [name] who saw patient in 11/2012 for preoperative clearance and yearly follow-up. He was felt to be at acceptable risk. PCP is Dr. Yaakov Guthrie.    EKG on 11/05/12 showed: NSR, pulmonary disease pattern, LAFB, ST/T wave abnormality, consider lateral ischemia.  Echo on 06/30/09 showed: - Left ventricle: With contrast LV function appears normal The cavity size was normal. Wall thickness was normal. Systolic function was normal. The estimated ejection fraction was in the range of 55% to 60%. Wall motion was normal; there were no regional wall motion abnormalities. - Aortic valve: AVR poorly visualized. No obvious abnormal gradient by doppler and no AR. LVOT is small A mechanical prosthesis was present. - Trivial pulmonic regurgitation. Mild tricuspid regurgitation.  Cardiac cath on 05/14/04 showed: CORONARIES: The left main was normal. The LAD was not a particularly large vessel. There were proximal luminal irregularities. There was a large first diagonal which was branching and normal. The circumflex in the AV groove was normal. OM-1 through OM-3 were relatively small vessels and normal. The right coronary artery is very large and dominant. There was a  large PDA and a very large posterior lateral which were normal. LEFT VENTRICULOGRAM: Left ventriculogram was obtained in the RAO projection with an EF of 65%. CONCLUSION: Severe to critical aortic stenosis.   Carotid duplex on 07/03/12 showed: 40-59% RICA stenosis no LICA stenosis.  CXR on 03/14/13 showed no active cardiopulmonary  disease.  Preoperative labs noted. A1C today was 7.3 (down from 8.0).    If no acute changes then I anticipate that he can proceed as planned.  George [facility]/Anesthesiology Phone [phone] 03/15/2013 11:43 AM

## 2013-03-16 LAB — LIPID PANEL
Chol/HDL Ratio: 3.7 ratio units (ref 0.0–5.0)
Cholesterol, Total: 152 mg/dL (ref 100–199)
HDL: 41 mg/dL (ref >39)
LDL Calculated: 90 mg/dL (ref 0–99)
Triglycerides: 105 mg/dL (ref 0–149)
VLDL Cholesterol Cal: 21 mg/dL (ref 5–40)

## 2013-03-16 LAB — HEPATIC FUNCTION PANEL
ALT: 24 IU/L (ref 0–44)
AST: 24 IU/L (ref 0–40)
Albumin: 4.1 g/dL (ref 3.6–4.8)
Alkaline Phosphatase: 51 IU/L (ref 39–117)
Bilirubin, Direct: 0.14 mg/dL (ref 0.00–0.40)
Total Bilirubin: 0.3 mg/dL (ref 0.0–1.2)
Total Protein: 6.6 g/dL (ref 6.0–8.5)

## 2013-03-20 MED ORDER — DEXTROSE 5 % IV SOLN
3.0000 g | INTRAVENOUS | Status: AC
  Administered 2013-03-21: 3 g via INTRAVENOUS
  Filled 2013-03-20: qty 3000

## 2013-03-21 ENCOUNTER — Encounter (HOSPITAL_COMMUNITY): Payer: Medicare Other | Admitting: Vascular Surgery

## 2013-03-21 ENCOUNTER — Ambulatory Visit (HOSPITAL_COMMUNITY): Payer: Medicare Other | Admitting: Certified Registered Nurse Anesthetist

## 2013-03-21 ENCOUNTER — Ambulatory Visit (HOSPITAL_COMMUNITY)
Admission: RE | Admit: 2013-03-21 | Discharge: 2013-03-22 | Disposition: A | Discharge status: Home / Self Care | Payer: Medicare Other | Source: Ambulatory Visit | Attending: Surgery | Admitting: Surgery

## 2013-03-21 ENCOUNTER — Encounter (HOSPITAL_COMMUNITY)
Admission: RE | Disposition: A | Discharge status: Home / Self Care | Payer: Self-pay | Source: Ambulatory Visit | Attending: Surgery

## 2013-03-21 ENCOUNTER — Encounter (HOSPITAL_COMMUNITY): Payer: Self-pay | Admitting: Certified Registered Nurse Anesthetist

## 2013-03-21 DIAGNOSIS — I10 Essential (primary) hypertension: Secondary | ICD-10-CM | POA: Insufficient documentation

## 2013-03-21 DIAGNOSIS — E119 Type 2 diabetes mellitus without complications: Secondary | ICD-10-CM | POA: Insufficient documentation

## 2013-03-21 DIAGNOSIS — Z7982 Long term (current) use of aspirin: Secondary | ICD-10-CM | POA: Insufficient documentation

## 2013-03-21 DIAGNOSIS — R011 Cardiac murmur, unspecified: Secondary | ICD-10-CM | POA: Insufficient documentation

## 2013-03-21 DIAGNOSIS — Z87891 Personal history of nicotine dependence: Secondary | ICD-10-CM | POA: Insufficient documentation

## 2013-03-21 DIAGNOSIS — F329 Major depressive disorder, single episode, unspecified: Secondary | ICD-10-CM | POA: Insufficient documentation

## 2013-03-21 DIAGNOSIS — E039 Hypothyroidism, unspecified: Secondary | ICD-10-CM | POA: Diagnosis not present

## 2013-03-21 DIAGNOSIS — E041 Nontoxic single thyroid nodule: Secondary | ICD-10-CM | POA: Diagnosis not present

## 2013-03-21 DIAGNOSIS — D440 Neoplasm of uncertain behavior of thyroid gland: Secondary | ICD-10-CM | POA: Diagnosis present

## 2013-03-21 DIAGNOSIS — F3289 Other specified depressive episodes: Secondary | ICD-10-CM | POA: Insufficient documentation

## 2013-03-21 DIAGNOSIS — Z888 Allergy status to other drugs, medicaments and biological substances status: Secondary | ICD-10-CM | POA: Insufficient documentation

## 2013-03-21 DIAGNOSIS — E042 Nontoxic multinodular goiter: Secondary | ICD-10-CM | POA: Diagnosis not present

## 2013-03-21 DIAGNOSIS — E7800 Pure hypercholesterolemia, unspecified: Secondary | ICD-10-CM | POA: Diagnosis not present

## 2013-03-21 DIAGNOSIS — Z794 Long term (current) use of insulin: Secondary | ICD-10-CM | POA: Diagnosis not present

## 2013-03-21 DIAGNOSIS — Z954 Presence of other heart-valve replacement: Secondary | ICD-10-CM | POA: Diagnosis not present

## 2013-03-21 HISTORY — PX: THYROIDECTOMY: SHX17

## 2013-03-21 LAB — GLUCOSE, CAPILLARY
GLUCOSE-CAPILLARY: 200 mg/dL — AB (ref 70–99)
Glucose-Capillary: 152 mg/dL — ABNORMAL HIGH (ref 70–99)
Glucose-Capillary: 148 mg/dL — ABNORMAL HIGH (ref 70–99)
Glucose-Capillary: 216 mg/dL — ABNORMAL HIGH (ref 70–99)
Glucose-Capillary: 202 mg/dL — ABNORMAL HIGH (ref 70–99)

## 2013-03-21 SURGERY — THYROIDECTOMY
Anesthesia: General | Site: Neck

## 2013-03-21 MED ORDER — PROPOFOL 10 MG/ML IV BOLUS
INTRAVENOUS | Status: AC
  Filled 2013-03-21: qty 20

## 2013-03-21 MED ORDER — GLYCOPYRROLATE 0.2 MG/ML IJ SOLN
INTRAMUSCULAR | Status: AC
  Filled 2013-03-21: qty 4

## 2013-03-21 MED ORDER — FENTANYL CITRATE 0.05 MG/ML IJ SOLN
INTRAMUSCULAR | Status: AC
  Filled 2013-03-21: qty 2

## 2013-03-21 MED ORDER — LIDOCAINE HCL (CARDIAC) 20 MG/ML IV SOLN
INTRAVENOUS | Status: DC | PRN
  Administered 2013-03-21: 70 mg via INTRAVENOUS

## 2013-03-21 MED ORDER — ACETAMINOPHEN 325 MG PO TABS: 650.0000 mg | ORAL_TABLET | ORAL | Status: DC | PRN

## 2013-03-21 MED ORDER — ONDANSETRON HCL 4 MG/2ML IJ SOLN
INTRAMUSCULAR | Status: DC | PRN
  Administered 2013-03-21: 4 mg via INTRAVENOUS

## 2013-03-21 MED ORDER — 0.9 % SODIUM CHLORIDE (POUR BTL) OPTIME
TOPICAL | Status: DC | PRN
  Administered 2013-03-21: 1000 mL

## 2013-03-21 MED ORDER — METOPROLOL TARTRATE 12.5 MG HALF TABLET
12.5000 mg | ORAL_TABLET | Freq: Two times a day (BID) | ORAL | Status: DC
  Administered 2013-03-22: 12.5 mg via ORAL
  Filled 2013-03-21 (×3): qty 1

## 2013-03-21 MED ORDER — EPHEDRINE SULFATE 50 MG/ML IJ SOLN
INTRAMUSCULAR | Status: AC
  Filled 2013-03-21: qty 1

## 2013-03-21 MED ORDER — FUROSEMIDE 40 MG PO TABS
40.0000 mg | ORAL_TABLET | Freq: Every day | ORAL | Status: DC
  Administered 2013-03-21 – 2013-03-22 (×2): 40 mg via ORAL
  Filled 2013-03-21 (×2): qty 1

## 2013-03-21 MED ORDER — LISINOPRIL 20 MG PO TABS
20.0000 mg | ORAL_TABLET | Freq: Every day | ORAL | Status: DC
  Administered 2013-03-22: 20 mg via ORAL
  Filled 2013-03-21: qty 1

## 2013-03-21 MED ORDER — ONDANSETRON HCL 4 MG/2ML IJ SOLN: 4.0000 mg | Freq: Four times a day (QID) | INTRAMUSCULAR | Status: DC | PRN

## 2013-03-21 MED ORDER — MENTHOL 3 MG MT LOZG
1.0000 | LOZENGE | OROMUCOSAL | Status: DC | PRN
  Filled 2013-03-21: qty 9

## 2013-03-21 MED ORDER — EPHEDRINE SULFATE 50 MG/ML IJ SOLN
INTRAMUSCULAR | Status: DC | PRN
  Administered 2013-03-21 (×2): 10 mg via INTRAVENOUS
  Administered 2013-03-21: 5 mg via INTRAVENOUS
  Administered 2013-03-21 (×2): 10 mg via INTRAVENOUS

## 2013-03-21 MED ORDER — KCL IN DEXTROSE-NACL 20-5-0.45 MEQ/L-%-% IV SOLN
INTRAVENOUS | Status: AC
  Filled 2013-03-21: qty 1000

## 2013-03-21 MED ORDER — LEVOTHYROXINE SODIUM 88 MCG PO TABS
88.0000 ug | ORAL_TABLET | Freq: Every day | ORAL | Status: DC
  Administered 2013-03-22: 88 ug via ORAL
  Filled 2013-03-21 (×2): qty 1

## 2013-03-21 MED ORDER — HYDROCODONE-ACETAMINOPHEN 5-325 MG PO TABS
ORAL_TABLET | ORAL | Status: AC
  Filled 2013-03-21: qty 1

## 2013-03-21 MED ORDER — INSULIN ASPART 100 UNIT/ML ~~LOC~~ SOLN
0.0000 [IU] | Freq: Three times a day (TID) | SUBCUTANEOUS | Status: DC
  Administered 2013-03-21: 7 [IU] via SUBCUTANEOUS
  Administered 2013-03-22: 11 [IU] via SUBCUTANEOUS

## 2013-03-21 MED ORDER — HYDROMORPHONE HCL PF 1 MG/ML IJ SOLN
1.0000 mg | INTRAMUSCULAR | Status: DC | PRN
  Administered 2013-03-21: 1 mg via INTRAVENOUS
  Filled 2013-03-21: qty 1

## 2013-03-21 MED ORDER — ONDANSETRON HCL 4 MG/2ML IJ SOLN
INTRAMUSCULAR | Status: AC
  Filled 2013-03-21: qty 2

## 2013-03-21 MED ORDER — PROPOFOL 10 MG/ML IV BOLUS
INTRAVENOUS | Status: DC | PRN
  Administered 2013-03-21: 160 mg via INTRAVENOUS

## 2013-03-21 MED ORDER — ACARBOSE 100 MG PO TABS
100.0000 mg | ORAL_TABLET | Freq: Two times a day (BID) | ORAL | Status: DC
  Administered 2013-03-22: 100 mg via ORAL
  Filled 2013-03-21 (×4): qty 1

## 2013-03-21 MED ORDER — NEOSTIGMINE METHYLSULFATE 1 MG/ML IJ SOLN
INTRAMUSCULAR | Status: DC | PRN
  Administered 2013-03-21: 5 mg via INTRAVENOUS

## 2013-03-21 MED ORDER — GLYCOPYRROLATE 0.2 MG/ML IJ SOLN
INTRAMUSCULAR | Status: DC | PRN
  Administered 2013-03-21: .8 mg via INTRAVENOUS

## 2013-03-21 MED ORDER — FENTANYL CITRATE 0.05 MG/ML IJ SOLN: 25.0000 ug | INTRAMUSCULAR | Status: DC | PRN

## 2013-03-21 MED ORDER — SUCCINYLCHOLINE CHLORIDE 20 MG/ML IJ SOLN
INTRAMUSCULAR | Status: AC
  Filled 2013-03-21: qty 1

## 2013-03-21 MED ORDER — LIDOCAINE HCL 4 % MT SOLN
OROMUCOSAL | Status: DC | PRN
  Administered 2013-03-21: 4 mL via TOPICAL

## 2013-03-21 MED ORDER — LACTATED RINGERS IV SOLN
INTRAVENOUS | Status: DC | PRN
  Administered 2013-03-21 (×2): via INTRAVENOUS

## 2013-03-21 MED ORDER — ONDANSETRON HCL 4 MG PO TABS: 4.0000 mg | ORAL_TABLET | Freq: Four times a day (QID) | ORAL | Status: DC | PRN

## 2013-03-21 MED ORDER — NEOSTIGMINE METHYLSULFATE 1 MG/ML IJ SOLN
INTRAMUSCULAR | Status: AC
  Filled 2013-03-21: qty 10

## 2013-03-21 MED ORDER — ROCURONIUM BROMIDE 50 MG/5ML IV SOLN
INTRAVENOUS | Status: AC
  Filled 2013-03-21: qty 1

## 2013-03-21 MED ORDER — FENTANYL CITRATE 0.05 MG/ML IJ SOLN
INTRAMUSCULAR | Status: DC | PRN
  Administered 2013-03-21: 100 ug via INTRAVENOUS
  Administered 2013-03-21: 50 ug via INTRAVENOUS

## 2013-03-21 MED ORDER — FENTANYL CITRATE 0.05 MG/ML IJ SOLN
25.0000 ug | INTRAMUSCULAR | Status: DC | PRN
  Administered 2013-03-21 (×4): 50 ug via INTRAVENOUS

## 2013-03-21 MED ORDER — ROCURONIUM BROMIDE 100 MG/10ML IV SOLN
INTRAVENOUS | Status: DC | PRN
  Administered 2013-03-21: 30 mg via INTRAVENOUS

## 2013-03-21 MED ORDER — KCL IN DEXTROSE-NACL 20-5-0.45 MEQ/L-%-% IV SOLN
INTRAVENOUS | Status: DC
  Administered 2013-03-21: 50 mL via INTRAVENOUS
  Filled 2013-03-21 (×3): qty 1000

## 2013-03-21 MED ORDER — STERILE WATER FOR INJECTION IJ SOLN
INTRAMUSCULAR | Status: AC
  Filled 2013-03-21: qty 10

## 2013-03-21 MED ORDER — MIDAZOLAM HCL 2 MG/2ML IJ SOLN
INTRAMUSCULAR | Status: AC
  Filled 2013-03-21: qty 2

## 2013-03-21 MED ORDER — FENTANYL CITRATE 0.05 MG/ML IJ SOLN
INTRAMUSCULAR | Status: AC
  Filled 2013-03-21: qty 5

## 2013-03-21 MED ORDER — CALCIUM CARBONATE-VITAMIN D 500-200 MG-UNIT PO TABS
2.0000 | ORAL_TABLET | Freq: Three times a day (TID) | ORAL | Status: DC
  Administered 2013-03-21 – 2013-03-22 (×3): 2 via ORAL
  Filled 2013-03-21 (×5): qty 2

## 2013-03-21 MED ORDER — LIDOCAINE HCL (CARDIAC) 20 MG/ML IV SOLN
INTRAVENOUS | Status: AC
  Filled 2013-03-21: qty 10

## 2013-03-21 MED ORDER — MIDAZOLAM HCL 5 MG/5ML IJ SOLN
INTRAMUSCULAR | Status: DC | PRN
  Administered 2013-03-21 (×2): 1 mg via INTRAVENOUS

## 2013-03-21 MED ORDER — HYDROCODONE-ACETAMINOPHEN 5-325 MG PO TABS
1.0000 | ORAL_TABLET | ORAL | Status: DC | PRN
  Administered 2013-03-21: 1 via ORAL

## 2013-03-21 MED ORDER — GLYBURIDE 5 MG PO TABS
5.0000 mg | ORAL_TABLET | Freq: Two times a day (BID) | ORAL | Status: DC
  Administered 2013-03-22: 5 mg via ORAL
  Filled 2013-03-21 (×4): qty 1

## 2013-03-21 MED ORDER — CITALOPRAM HYDROBROMIDE 20 MG PO TABS
20.0000 mg | ORAL_TABLET | Freq: Every day | ORAL | Status: DC
  Administered 2013-03-22: 20 mg via ORAL
  Filled 2013-03-21: qty 1

## 2013-03-21 MED ORDER — SUCCINYLCHOLINE CHLORIDE 20 MG/ML IJ SOLN
INTRAMUSCULAR | Status: DC | PRN
  Administered 2013-03-21: 100 mg via INTRAVENOUS

## 2013-03-21 SURGICAL SUPPLY — 47 items
APL SKNCLS STERI-STRIP NONHPOA (GAUZE/BANDAGES/DRESSINGS) ×1
BENZOIN TINCTURE PRP APPL 2/3 (GAUZE/BANDAGES/DRESSINGS) ×2 IMPLANT
BLADE SURG 10 STRL SS (BLADE) ×2 IMPLANT
BLADE SURG 15 STRL LF DISP TIS (BLADE) ×1 IMPLANT
BLADE SURG 15 STRL SS (BLADE) ×2
CANISTER SUCTION 2500CC (MISCELLANEOUS) ×2 IMPLANT
CLIP TI MEDIUM 24 (CLIP) ×2 IMPLANT
CLIP TI WIDE RED SMALL 24 (CLIP) ×3 IMPLANT
CLSR STERI-STRIP ANTIMIC 1/2X4 (GAUZE/BANDAGES/DRESSINGS) ×1 IMPLANT
CONT SPEC 4OZ CLIKSEAL STRL BL (MISCELLANEOUS) IMPLANT
COVER SURGICAL LIGHT HANDLE (MISCELLANEOUS) ×2 IMPLANT
CRADLE DONUT ADULT HEAD (MISCELLANEOUS) ×2 IMPLANT
DRAPE PED LAPAROTOMY (DRAPES) ×2 IMPLANT
DRAPE UTILITY 15X26 W/TAPE STR (DRAPE) ×4 IMPLANT
ELECT CAUTERY BLADE 6.4 (BLADE) ×2 IMPLANT
ELECT REM PT RETURN 9FT ADLT (ELECTROSURGICAL) ×2
ELECTRODE REM PT RTRN 9FT ADLT (ELECTROSURGICAL) ×1 IMPLANT
GAUZE SPONGE 4X4 16PLY XRAY LF (GAUZE/BANDAGES/DRESSINGS) ×3 IMPLANT
GLOVE BIOGEL PI IND STRL 7.0 (GLOVE) IMPLANT
GLOVE BIOGEL PI INDICATOR 7.0 (GLOVE) ×1
GLOVE SS BIOGEL STRL SZ 6.5 (GLOVE) IMPLANT
GLOVE SUPERSENSE BIOGEL SZ 6.5 (GLOVE) ×1
GLOVE SURG ORTHO 8.0 STRL STRW (GLOVE) ×2 IMPLANT
GLOVE SURG SS PI 7.0 STRL IVOR (GLOVE) ×1 IMPLANT
GOWN STRL NON-REIN LRG LVL3 (GOWN DISPOSABLE) ×4 IMPLANT
GOWN STRL REIN XL XLG (GOWN DISPOSABLE) ×2 IMPLANT
HEMOSTAT SURGICEL 2X4 FIBR (HEMOSTASIS) ×2 IMPLANT
KIT BASIN OR (CUSTOM PROCEDURE TRAY) ×2 IMPLANT
KIT ROOM TURNOVER OR (KITS) ×2 IMPLANT
NS IRRIG 1000ML POUR BTL (IV SOLUTION) ×2 IMPLANT
PACK SURGICAL SETUP 50X90 (CUSTOM PROCEDURE TRAY) ×2 IMPLANT
PAD ARMBOARD 7.5X6 YLW CONV (MISCELLANEOUS) ×2 IMPLANT
PENCIL BUTTON HOLSTER BLD 10FT (ELECTRODE) ×2 IMPLANT
SHEARS HARMONIC 9CM CVD (BLADE) ×2 IMPLANT
SPECIMEN JAR MEDIUM (MISCELLANEOUS) IMPLANT
SPONGE GAUZE 4X4 12PLY (GAUZE/BANDAGES/DRESSINGS) ×2 IMPLANT
SPONGE INTESTINAL PEANUT (DISPOSABLE) ×2 IMPLANT
STRIP CLOSURE SKIN 1/2X4 (GAUZE/BANDAGES/DRESSINGS) ×2 IMPLANT
SUT MNCRL AB 4-0 PS2 18 (SUTURE) ×2 IMPLANT
SUT SILK 2 0 (SUTURE) ×2
SUT SILK 2-0 18XBRD TIE 12 (SUTURE) ×1 IMPLANT
SUT VIC AB 3-0 SH 18 (SUTURE) ×3 IMPLANT
SYR BULB 3OZ (MISCELLANEOUS) ×2 IMPLANT
TAPE CLOTH SOFT 2X10 (GAUZE/BANDAGES/DRESSINGS) ×1 IMPLANT
TOWEL OR 17X24 6PK STRL BLUE (TOWEL DISPOSABLE) ×2 IMPLANT
TOWEL OR 17X26 10 PK STRL BLUE (TOWEL DISPOSABLE) ×2 IMPLANT
TUBE CONNECTING 12X1/4 (SUCTIONS) ×2 IMPLANT

## 2013-03-21 NOTE — Brief Op Note (Signed)
03/21/2013  9:53 AM  PATIENT:  [name]  68 y.o. male  PRE-OPERATIVE DIAGNOSIS:  THYROID NODULES WITH ATYPIA   POST-OPERATIVE DIAGNOSIS:  THYROID NODULES WITH ATYPIA  PROCEDURE:  Procedure(s): THYROIDECTOMY (N/A)  SURGEON:  Surgeon(s) and Role:    * [name], MD - Primary  ANESTHESIA:   general  EBL:  Total I/O In: 1000 [I.V.:1000] Out: 50 [Blood:50]  BLOOD ADMINISTERED:none  DRAINS: none   LOCAL MEDICATIONS USED:  NONE  SPECIMEN:  Excision  DISPOSITION OF SPECIMEN:  PATHOLOGY  COUNTS:  YES  TOURNIQUET:  * No tourniquets in log *  DICTATION: .Other Dictation: Dictation Number [id number]  PLAN OF CARE: Admit for overnight observation  PATIENT DISPOSITION:  PACU - hemodynamically stable.   Delay start of Pharmacological VTE agent (>24hrs) due to surgical blood loss or risk of bleeding: yes  [name], MD, [facility] Office: [phone]

## 2013-03-21 NOTE — Transfer of Care (Signed)
Immediate Anesthesia Transfer of Care Note  Patient: [name]  Procedure(s) Performed: Procedure(s): THYROIDECTOMY (N/A)  Patient Location: PACU  Anesthesia Type:General  Level of Consciousness: awake and alert   Airway & Oxygen Therapy: Patient Spontanous Breathing and Patient connected to nasal cannula oxygen  Post-op Assessment: Report given to PACU RN, Post -op Vital signs reviewed and stable and Patient moving all extremities X 4  Post vital signs: Reviewed and stable  Complications: No apparent anesthesia complications

## 2013-03-21 NOTE — Anesthesia Postprocedure Evaluation (Signed)
  Anesthesia Post-op Note  Patient: [name]  Procedure(s) Performed: Procedure(s): THYROIDECTOMY (N/A)  Patient Location: PACU  Anesthesia Type:General  Level of Consciousness: awake  Airway and Oxygen Therapy: Patient Spontanous Breathing  Post-op Pain: mild  Post-op Assessment: Post-op Vital signs reviewed  Post-op Vital Signs: Reviewed  Complications: No apparent anesthesia complications

## 2013-03-21 NOTE — Op Note (Signed)
[name], [name]              ACCOUNT NO.:  [id number]  MEDICAL RECORD NO.:  [id number]  LOCATION:  6N26C                        FACILITY:  [facility]  PHYSICIAN:  [name], MD      DATE OF BIRTH:  17-May-1945  DATE OF PROCEDURE:  03/21/2013                              OPERATIVE REPORT   PREOPERATIVE DIAGNOSIS:  Bilateral thyroid nodules with atypia.  POSTOPERATIVE DIAGNOSIS:  Bilateral thyroid nodules with atypia.  PROCEDURE:  Total thyroidectomy.  SURGEON:  [name], MD, FACS  ANESTHESIA:  General per Finis Bud, MD  ESTIMATED BLOOD LOSS:  Minimal.  PREPARATION:  ChloraPrep.  COMPLICATIONS:  None.  INDICATIONS:  The patient is a 68 year old male referred by his primary care physician for evaluation of multinodular thyroid goiter with atypia.  The patient has a history of hyperthyroidism dating back many years.  He had undergone previous treatment with radioactive iodine.  He has now been on thyroid hormone supplementation, taking levothyroxine 88 mcg daily.  In July 2014, an ultrasound showed bilateral thyroid nodules with the largest nodules measuring between 2 and 3 cm in size.  The patient underwent biopsy of right-sided thyroid nodule, which contained microcalcifications.  This showed atypia with nuclear grooves and nuclear overlap.  The patient now comes to surgery for thyroidectomy for definitive diagnosis.  BODY OF REPORT:  Procedures done in OR #2 at the [facility].  The patient was brought to the operating room, placed in a supine position on the operating room table.  Following administration of general anesthesia, the patient is positioned and then prepped and draped in the usual aseptic fashion.  After ascertaining that an adequate level of anesthesia been achieved, a Kocher incision is made with a #15 blade.  Dissection is carried through subcutaneous tissues and platysma.  Hemostasis is achieved with the  electrocautery.  Skin flaps are developed cephalad and caudad from the thyroid notch to the sternal notch.  A Mahorner self-retaining retractor is placed for exposure.  Strap muscles are incised in the midline.  Dissection is carried down to the thyroid isthmus.  Strap muscles are reflected laterally to the left exposing the left thyroid lobe.  There is a dense inflammatory process causing the thyroid tissue to be adherent to the surrounding structures.  This may represent radiation change, although the possibility of locally advanced, neoplasm is not excluded.  Thyroid gland is multinodular, and is quite hard to palpation.  With some difficulty, strap muscles are reflected off of the gland.  Venous tributaries are divided between Ligaclips with the Harmonic scalpel. Dissection is continued allowing for mobilization of the superior pole. Superior pole vessels are divided individually between small and medium Ligaclips with the Harmonic scalpel.  Parathyroid tissue is not easily identified.  Gland is gently mobilized and rolled anteriorly.  There is a large firm mass in the inferior portion of the left thyroid lobe, which is enlarged.  Vascular structures are dissected out, divided between small Ligaclips with the Harmonic scalpel.  Gland is mobilized anteriorly and the ligament of Gwenlyn Found is released with the electrocautery.  Recurrent nerve is at least partially identified and an attempt is made at its preservation.  Ligament of Gwenlyn Found is released and the gland is mobilized onto the anterior trachea.  There is a small pyramidal lobe which is resected with the isthmus.  Isthmus is mobilized across the midline.  Dry pack is placed in the left neck.  Next, we turned our attention to the right thyroid lobe.  Again strap muscles are reflected laterally.  Right lobe is multinodular.  There are firm nodules which appear calcified.  It is gently mobilized with blunt dissection.  Venous tributaries  were divided between small and medium Ligaclips.  Superior pole vessels are dissected out and divided between small and medium Ligaclips with the Harmonic scalpel.  There is either thyroid tissue or tumor extending into the right thyrothymic tract which is also resected en bloc with the right lobe.  Ligament of Gwenlyn Found is released with the electrocautery and the gland is mobilized onto the anterior trachea from which it is completely excised with the electrocautery.  Remaining inferior venous tributaries are divided between Ligaclips with the Harmonic scalpel and the specimen is removed. Suture was used to mark the left superior pole.  The entire thyroid gland was submitted to Pathology for review.  The neck is irrigated with warm saline.  Good hemostasis is achieved with Ligaclips bilaterally.  Fibrillar is placed throughout the operative field.  Strap muscles are reapproximated in the midline with interrupted 3-0 Vicryl sutures.  Platysma is closed with interrupted 3-0 Vicryl sutures.  Skin is closed with a running 4-0 Monocryl subcuticular suture.  Wound was washed and dried and benzoin and Steri-Strips were applied.  Sterile dressings were applied.  The patient is awakened from anesthesia and brought to the recovery room.  The patient tolerated the procedure well.   [name], MD, [facility] Office: [phone]   TMG/MEDQ  D:  03/21/2013  T:  03/21/2013  Job:  [id number]

## 2013-03-21 NOTE — Preoperative (Signed)
Beta Blockers   Reason not to administer Beta Blockers:Not Applicable, patient took Metoprolol 03/21/13.

## 2013-03-21 NOTE — H&P (Signed)
[name] is an 68 y.o. male.    General Surgery [facility]  Chief Complaint: thyroid nodules with atypia, hx of radiation to thyroid  HPI: Patient is a 68 year old male referred by his primary care physician for evaluation of multinodular thyroid goiter with atypia. Patient has a history of hyperthyroidism dating back many years. He was seen and evaluated by an endocrinologist. He underwent treatment with radioactive iodine. He has been on thyroid hormone supplementation since that time and currently takes levothyroxine 88 mcg daily. In July 2014 the patient underwent a thyroid ultrasound. This showed bilateral thyroid nodules ranging in size from 2.0-3.0 cm. In October 2014, the patient underwent biopsy of a right-sided thyroid nodule which contained microcalcifications. Cytologic atypia was noted including nuclear grooves and nuclear overlap. A follicular neoplasm could not be excluded. Patient is referred at this time for consideration for excision for definitive diagnosis. Patient has no prior history of head or neck surgery. There is no family history of thyroid disease and specifically no family history of thyroid cancer. There is no family history of other endocrine neoplasm.   Past Medical History  Diagnosis Date  . 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 BPH (benign prostatic hypertrophy) 04/15/2010  . DEPRESSION 01/22/2009  . HYPERCHOLESTEROLEMIA 01/22/2009  . 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 Morbid obesity   . Heart valve replaced   . Colon polyps   . Ulcer   . Heart murmur   . HYPERTENSION 01/22/2009    dr Casimer Lanius  . Arthritis   . Tremors of nervous system     ?ptsd    Past Surgical History  Procedure Laterality Date  . Appendectomy    . Aortic valve replacement  July 2006    #20 stentless [place] porcine valve  . Dental surgery  05/2004    Dental extractions  . Heel spur surgery      resection of heel spur  . Colonoscopy  04/24/2007    [name]:  normal  . Tonsillectomy    . Bilateral cataract surg    . Colonoscopy with esophagogastroduodenoscopy (egd) N/A 04/05/2012    Procedure: COLONOSCOPY WITH ESOPHAGOGASTRODUODENOSCOPY (EGD);  Surgeon: Daneil Dolin, MD;  Location: [facility];  Service: Endoscopy;  Laterality: N/A;  10;15    Family History  Problem Relation Age of Onset  . Diabetes     Social History:  reports that he quit smoking about 51 years ago. He has never used smokeless tobacco. He reports that he does not drink alcohol or use illicit drugs.  Allergies:  Allergies  Allergen Reactions  . Phenothiazines Anaphylaxis  . Pioglitazone Swelling    Medications Prior to Admission  Medication Sig Dispense Refill  . acarbose (PRECOSE) 100 MG tablet Take 1 tablet (100 mg total) by mouth 2 (two) times daily.  180 tablet  1  . aspirin 81 MG tablet Take 81 mg by mouth daily.        . citalopram (CELEXA) 20 MG tablet Take 1 tablet (20 mg total) by mouth daily.  30 tablet  5  . furosemide (LASIX) 40 MG tablet Take 1 tablet (40 mg total) by mouth daily.  30 tablet  4  . glyBURIDE (DIABETA) 5 MG tablet Take 1 tablet (5 mg total) by mouth 2 (two) times daily.  60 tablet  1  . insulin detemir (LEVEMIR) 100 UNIT/ML injection Inject 0.8 mLs (80 Units total) into the skin 2 (two) times daily.  10 mL  5  . levothyroxine (SYNTHROID,  LEVOTHROID) 88 MCG tablet Take 1 tablet (88 mcg total) by mouth daily.  30 tablet  5  . Linagliptin-Metformin HCl 2.05-998 MG TABS Take 1 tablet by mouth 2 (two) times daily.  70 tablet  0  . lisinopril (PRINIVIL,ZESTRIL) 20 MG tablet Take 1 tablet (20 mg total) by mouth daily.  30 tablet  4  . metoprolol tartrate (LOPRESSOR) 25 MG tablet Take 12.5 mg by mouth 2 (two) times daily.       . Multiple Vitamins-Minerals (MENS MULTI VITAMIN & MINERAL PO) Take 1 tablet by mouth daily.        Marland Kitchen nystatin (MYCOSTATIN) powder Apply 1 g topically daily as needed (rash).       . Omega-3 Fatty Acids (FISH OIL) 1200 MG  CAPS Take 1-2 capsules by mouth 2 (two) times daily. 1 capsule by mouth q am, 2 capsules by mouth q pm      . simvastatin (ZOCOR) 40 MG tablet Take 1 tablet (40 mg total) by mouth at bedtime.  30 tablet  4    Results for orders placed during the hospital encounter of 03/21/13 (from the past 48 hour(s))  GLUCOSE, CAPILLARY     Status: Abnormal   Collection Time    03/21/13  6:37 AM      Result Value Ref Range   Glucose-Capillary 152 (*) 70 - 99 mg/dL   No results found.  Review of Systems  Constitutional: Negative.   HENT: Negative.   Eyes: Negative.   Respiratory: Positive for shortness of breath.   Cardiovascular: Positive for palpitations.  Gastrointestinal: Negative.   Genitourinary: Negative.   Musculoskeletal: Negative.   Skin: Negative.   Neurological: Negative.   Endo/Heme/Allergies: Bruises/bleeds easily.  Psychiatric/Behavioral: Negative.     Blood pressure 144/62, pulse 69, temperature 98.1 F (36.7 C), temperature source Oral, SpO2 96.00%. Physical Exam  Constitutional: He is oriented to person, place, and time. He appears well-developed and well-nourished. No distress.  HENT:  Head: Normocephalic and atraumatic.  Right Ear: External ear normal.  Left Ear: External ear normal.  Eyes: Conjunctivae are normal. Pupils are equal, round, and reactive to light. No scleral icterus.  Neck: Normal range of motion. Neck supple. No tracheal deviation present. Thyromegaly present.  Cardiovascular: Normal rate and regular rhythm.   Murmur heard. Respiratory: Effort normal and breath sounds normal. He has no wheezes.  GI: Soft. Bowel sounds are normal. He exhibits no distension. There is no tenderness.  Musculoskeletal: Normal range of motion. He exhibits no edema.  Lymphadenopathy:    He has no cervical adenopathy.  Neurological: He is alert and oriented to person, place, and time.  Skin: Skin is warm and dry.  Psychiatric: He has a normal mood and affect. His behavior is  normal.     Assessment/Plan Bilateral thyroid nodules with atypia, history of radioactive iodine treatment  Plan total thyroidectomy  The risks and benefits of the procedure have been discussed at length with the patient.  The patient understands the proposed procedure, potential alternative treatments, and the course of recovery to be expected.  All of the patient's questions have been answered at this time.  The patient wishes to proceed with surgery.  [name], MD, [facility] Office: [place] 03/21/2013, 7:15 AM

## 2013-03-21 NOTE — Anesthesia Procedure Notes (Signed)
Procedure Name: Intubation Date/Time: 03/21/2013 7:38 AM Performed by: Jacob Moores Pre-anesthesia Checklist: Patient identified, Emergency Drugs available, Suction available and Patient being monitored Patient Re-evaluated:Patient Re-evaluated prior to inductionOxygen Delivery Method: Circle system utilized Preoxygenation: Pre-oxygenation with 100% oxygen Intubation Type: IV induction and Rapid sequence Ventilation: Oral airway inserted - appropriate to patient size and Two handed mask ventilation required Grade View: Grade I Tube type: Oral Tube size: 7.5 mm Number of attempts: 1 Airway Equipment and Method: Stylet,  Video-laryngoscopy and Oral airway Placement Confirmation: ETT inserted through vocal cords under direct vision,  positive ETCO2 and breath sounds checked- equal and bilateral Secured at: 23 cm Tube secured with: Tape Dental Injury: Teeth and Oropharynx as per pre-operative assessment

## 2013-03-21 NOTE — Anesthesia Preprocedure Evaluation (Addendum)
Anesthesia Evaluation  Patient identified by MRN, date of birth, ID band Patient awake    Reviewed: Allergy & Precautions, H&P , NPO status , Patient's Chart, lab work & pertinent test results, reviewed documented beta blocker date and time   Airway Mallampati: III TM Distance: >3 FB   Mouth opening: Limited Mouth Opening  Dental  (+) Dental Advisory Given, Edentulous Upper, Partial Lower, Poor Dentition   Pulmonary former smoker,  breath sounds clear to auscultation        Cardiovascular hypertension, Pt. on medications and Pt. on home beta blockers + Peripheral Vascular Disease + Valvular Problems/Murmurs Rhythm:Regular Rate:Normal  S/p AVR July 2006  06/30/09 2D Echo:    Study Conclusions  - Left ventricle: With contrast LV function appears normal The cavity size was normal. Wall thickness was normal. Systolic function was normal. The estimated ejection fraction was in the range of 55% to 60%. Wall motion was normal; there were no regional wall motion abnormalities. - Aortic valve: AVR poorly visualized. No obvious abnormal gradient by doppler and no AR. LVOT is small A mechanical prosthesis was present.   Neuro/Psych PSYCHIATRIC DISORDERS Depression    GI/Hepatic GERD-  ,  Endo/Other  diabetes, Insulin Dependent, Oral Hypoglycemic AgentsHypothyroidism Morbid obesity  Renal/GU      Musculoskeletal  (+) Arthritis -,   Abdominal   Peds  Hematology   Anesthesia Other Findings   Reproductive/Obstetrics                      Anesthesia Physical Anesthesia Plan  ASA: III  Anesthesia Plan: General   Post-op Pain Management:    Induction: Intravenous  Airway Management Planned: Oral ETT and Video Laryngoscope Planned  Additional Equipment:   Intra-op Plan:   Post-operative Plan: Extubation in OR  Informed Consent: I have reviewed the patients History and Physical, chart, labs and discussed the  procedure including the risks, benefits and alternatives for the proposed anesthesia with the patient or authorized representative who has indicated his/her understanding and acceptance.   Dental advisory given  Plan Discussed with: CRNA, Anesthesiologist and Surgeon  Anesthesia Plan Comments:        Anesthesia Quick Evaluation

## 2013-03-22 ENCOUNTER — Other Ambulatory Visit (INDEPENDENT_AMBULATORY_CARE_PROVIDER_SITE_OTHER): Payer: Self-pay

## 2013-03-22 ENCOUNTER — Telehealth (INDEPENDENT_AMBULATORY_CARE_PROVIDER_SITE_OTHER): Payer: Self-pay

## 2013-03-22 ENCOUNTER — Encounter (HOSPITAL_COMMUNITY): Payer: Self-pay | Admitting: Surgery

## 2013-03-22 LAB — BASIC METABOLIC PANEL
BUN: 24 mg/dL — ABNORMAL HIGH (ref 6–23)
CHLORIDE: 102 meq/L (ref 96–112)
CO2: 27 mEq/L (ref 19–32)
Calcium: 8.7 mg/dL (ref 8.4–10.5)
Creatinine, Ser: 1.3 mg/dL (ref 0.50–1.35)
GFR calc Af Amer: 63 mL/min — ABNORMAL LOW (ref >90)
GFR, EST NON AFRICAN AMERICAN: 54 mL/min — AB (ref >90)
GLUCOSE: 246 mg/dL — AB (ref 70–99)
POTASSIUM: 4.8 meq/L (ref 3.7–5.3)
Sodium: 139 mEq/L (ref 137–147)

## 2013-03-22 LAB — GLUCOSE, CAPILLARY: Glucose-Capillary: 254 mg/dL — ABNORMAL HIGH (ref 70–99)

## 2013-03-22 MED ORDER — HYDROCODONE-ACETAMINOPHEN 5-325 MG PO TABS: 1.0000 | ORAL_TABLET | ORAL | Status: DC | PRN

## 2013-03-22 MED ORDER — CALCIUM CARBONATE-VITAMIN D 500-200 MG-UNIT PO TABS: 2.0000 | ORAL_TABLET | Freq: Two times a day (BID) | ORAL | Status: DC

## 2013-03-22 NOTE — Discharge Summary (Signed)
Physician Discharge Summary [facility]  Patient ID: [name] MRN: [id number] DOB/AGE: 1945/09/08 68 y.o.  Admit date: 03/21/2013 Discharge date: 03/22/2013  Admission Diagnoses:  Multinodular thyroid with atypia  Discharge Diagnoses:  Principal Problem:   Neoplasm of uncertain behavior of thyroid gland, right lobe Active Problems:   GOITER, MULTINODULAR   HYPOTHYROIDISM, POST-RADIATION   Discharged Condition: good  Hospital Course: Patient was admitted for observation following thyroid surgery.  Post op course was uncomplicated.  Pain was well controlled.  Tolerated diet.  Post op calcium level on morning following surgery was 8.7 mg/dl.  Patient was prepared for discharge home on POD#1.  Consults: None  Significant Diagnostic Studies: labs: calcium  Treatments: surgery: total thyroidectomy  Discharge Exam: Blood pressure 114/45, pulse 75, temperature 98.5 F (36.9 C), temperature source Oral, resp. rate 18, SpO2 96.00%. HEENT - clear Neck - mild STS; wound clear and dry and intact; voice with slight hoarseness Chest - clear bilaterally Cor - RRR   Disposition: Home with family  Discharge Orders   Future Orders Complete By Expires   Apply dressing  As directed    Scheduling Instructions:     Apply light gauze dressing to wound before discharge home today.   Diet - low sodium heart healthy  As directed    Discharge instructions  As directed    Comments:     THYROID & PARATHYROID SURGERY - POST OP INSTRUCTIONS  Always review your discharge instruction sheet from the facility where your surgery was performed.  A prescription for pain medication may be given to you upon discharge.  Take your pain medication as prescribed.  If narcotic pain medicine is not needed, then you may take acetaminophen (Tylenol) or ibuprofen (Advil) as needed.  Take your usually prescribed medications unless otherwise directed.  If you need a refill on your pain  medication, please contact your pharmacy. They will contact our office to request authorization.  Prescriptions will not be processed after 5 pm or on weekends.  Start with a light diet upon arrival home, such as soup and crackers or toast.  Be sure to drink plenty of fluids daily.  Resume your normal diet the day after surgery.  Most patients will experience some swelling and bruising on the chest and neck area.  Ice packs will help.  Swelling and bruising can take several days to resolve.   It is common to experience some constipation if taking pain medication after surgery.  Increasing fluid intake and taking a stool softener will usually help or prevent this problem.  A mild laxative (Milk of Magnesia or Miralax) should be taken according to package directions if there are no bowel movements after 48 hours.  You may remove your bandages 24-48 hours after surgery, and you may shower at that time.  You have steri-strips (small skin tapes) in place directly over the incision.  These strips should be left on the skin for 7-10 days and then removed.  You may resume regular (light) daily activities beginning the next day-such as daily self-care, walking, climbing stairs-gradually increasing activities as tolerated.  You may have sexual intercourse when it is comfortable.  Refrain from any heavy lifting or straining until approved by your doctor.  You may drive when you no longer are taking prescription pain medication, you can comfortably wear a seatbelt, and you can safely maneuver your car and apply brakes.  You should see your doctor in the office for a follow-up appointment approximately  two to three weeks after your surgery.  Make sure that you call for this appointment within a day or two after you arrive home to insure a convenient appointment time.  WHEN TO CALL YOUR DOCTOR: -- Fever greater than 101.5 -- Inability to urinate -- Nausea and/or vomiting - persistent -- Extreme swelling or  bruising -- Continued bleeding from incision -- Increased pain, redness, or drainage from the incision -- Difficulty swallowing or breathing -- Muscle cramping or spasms -- Numbness or tingling in hands or around lips  The clinic staff is available to answer your questions during regular business hours.  Please don't hesitate to call and ask to speak to one of the nurses if you have concerns.  [name], MD, [facility] Office: [phone]   Increase activity slowly  As directed    Remove dressing in 24 hours  As directed        Medication List         acarbose 100 MG tablet  Commonly known as:  PRECOSE  Take 1 tablet (100 mg total) by mouth 2 (two) times daily.     aspirin 81 MG tablet  Take 81 mg by mouth daily.     calcium-vitamin D 500-200 MG-UNIT per tablet  Commonly known as:  OSCAL WITH D  Take 2 tablets by mouth 2 (two) times daily.     citalopram 20 MG tablet  Commonly known as:  CELEXA  Take 1 tablet (20 mg total) by mouth daily.     Fish Oil 1200 MG Caps  Take 1-2 capsules by mouth 2 (two) times daily. 1 capsule by mouth q am, 2 capsules by mouth q pm     furosemide 40 MG tablet  Commonly known as:  LASIX  Take 1 tablet (40 mg total) by mouth daily.     glyBURIDE 5 MG tablet  Commonly known as:  DIABETA  Take 1 tablet (5 mg total) by mouth 2 (two) times daily.     HYDROcodone-acetaminophen 5-325 MG per tablet  Commonly known as:  NORCO/VICODIN  Take 1-2 tablets by mouth every 4 (four) hours as needed for moderate pain.     insulin detemir 100 UNIT/ML injection  Commonly known as:  LEVEMIR  Inject 0.8 mLs (80 Units total) into the skin 2 (two) times daily.     levothyroxine 88 MCG tablet  Commonly known as:  SYNTHROID, LEVOTHROID  Take 1 tablet (88 mcg total) by mouth daily.     Linagliptin-Metformin HCl 2.05-998 MG Tabs  Take 1 tablet by mouth 2 (two) times daily.     lisinopril 20 MG  tablet  Commonly known as:  PRINIVIL,ZESTRIL  Take 1 tablet (20 mg total) by mouth daily.     MENS MULTI VITAMIN & MINERAL PO  Take 1 tablet by mouth daily.     metoprolol tartrate 25 MG tablet  Commonly known as:  LOPRESSOR  Take 12.5 mg by mouth 2 (two) times daily.     nystatin powder  Commonly known as:  MYCOSTATIN  Apply 1 g topically daily as needed (rash).     simvastatin 40 MG tablet  Commonly known as:  ZOCOR  Take 1 tablet (40 mg total) by mouth at bedtime.           Follow-up Information   Follow up with [name], MD. Schedule an appointment as soon as possible for a visit in 3 weeks.   Specialty:  General Surgery   Contact information:   [address] [place] [address] [phone]       [name], MD, [facility] Office: [phone]   Signed: [name] 03/22/2013, 8:29 AM

## 2013-03-22 NOTE — Progress Notes (Signed)
Discharge home. Home discharge instruction given, no questions verbalized. 

## 2013-03-22 NOTE — Progress Notes (Signed)
Quick Note:  Please contact patient and notify of benign pathology results.  Sahory Nordling M. Luara Faye, MD, FACS [facility] Office: [phone]   ______ 

## 2013-03-22 NOTE — Telephone Encounter (Signed)
Lab slip mailed to pt to get labs prior to ov. Lm with pts brother to have pt call to confirm appt.

## 2013-03-25 ENCOUNTER — Telehealth (INDEPENDENT_AMBULATORY_CARE_PROVIDER_SITE_OTHER): Payer: Self-pay

## 2013-03-25 NOTE — Telephone Encounter (Signed)
Pt called stating he has decreased appetite. He states he swallows well without difficulty. Wd is not red or swollen. I advised him to push fluids even if he does not feel like eating much he needs to hydrate. Pt to call if any other symptoms occur.

## 2013-03-29 ENCOUNTER — Telehealth (INDEPENDENT_AMBULATORY_CARE_PROVIDER_SITE_OTHER): Payer: Self-pay | Admitting: General Surgery

## 2013-03-29 NOTE — Telephone Encounter (Signed)
Pt called to report itching at the incision/ steri strips site.  Asking permission to remove strips.  Advised pt to leave them in place until at least Monday, then carefully remove them.  Do not apply any creams or lotions there.  May take Benadryl for the itching (he asked it okay to try this.)  Pt understands and will comply.

## 2013-04-01 ENCOUNTER — Telehealth (INDEPENDENT_AMBULATORY_CARE_PROVIDER_SITE_OTHER): Payer: Self-pay

## 2013-04-01 LAB — CALCIUM: Calcium: 9.1 mg/dL (ref 8.4–10.5)

## 2013-04-01 NOTE — Telephone Encounter (Signed)
[facility] calling to see if patient can have labs drawn today.  Order slip indicates 04/08/13, I spoke to [place], LPN for Dr. [name] and she said it's okay to proceed with labs today.

## 2013-04-03 ENCOUNTER — Telehealth (INDEPENDENT_AMBULATORY_CARE_PROVIDER_SITE_OTHER): Payer: Self-pay | Admitting: General Surgery

## 2013-04-03 NOTE — Telephone Encounter (Signed)
Pt called to ask if okay to remove steri-strips now.  Surgery was on 03/21/13.  Incision is healing well and strips are itchy.  Explained to slowly pull the ends towards the incision and rock the adhesive off the incision.  No ointments or dressing required afterward.  He understands.

## 2013-04-10 ENCOUNTER — Encounter (INDEPENDENT_AMBULATORY_CARE_PROVIDER_SITE_OTHER): Payer: Self-pay | Admitting: Surgery

## 2013-04-10 ENCOUNTER — Ambulatory Visit (INDEPENDENT_AMBULATORY_CARE_PROVIDER_SITE_OTHER): Payer: Medicare Other | Admitting: Surgery

## 2013-04-10 ENCOUNTER — Telehealth (INDEPENDENT_AMBULATORY_CARE_PROVIDER_SITE_OTHER): Payer: Self-pay

## 2013-04-10 VITALS — BP 120/60 | HR 80 | Temp 98.5°F | Resp 14 | Ht 73.0 in | Wt 302.6 lb

## 2013-04-10 DIAGNOSIS — D449 Neoplasm of uncertain behavior of unspecified endocrine gland: Secondary | ICD-10-CM

## 2013-04-10 DIAGNOSIS — E042 Nontoxic multinodular goiter: Secondary | ICD-10-CM

## 2013-04-10 DIAGNOSIS — E890 Postprocedural hypothyroidism: Secondary | ICD-10-CM

## 2013-04-10 DIAGNOSIS — D440 Neoplasm of uncertain behavior of thyroid gland: Secondary | ICD-10-CM

## 2013-04-10 NOTE — Progress Notes (Signed)
General Surgery [facility]  Chief Complaint  Patient presents with  . Routine Post Op    total thyroidectomy 03/21/2013    HISTORY: Patient is a 68 year old male who underwent total thyroidectomy on 03/21/2013. Final pathology shows benign follicular nodules which were extensively hyalinized and fibrotic with calcifications compatible with previous radioactive iodine treatment. There was no evidence of malignancy.  Postoperative serum calcium level is normal at 9.1. Patient is currently taking Synthroid 88 mcg daily.  EXAM: Surgical incision is nicely healed. Mild soft tissue swelling. No sign of seroma. No sign of infection. Voice quality is normal.  IMPRESSION: Status post total thyroidectomy with benign final pathology  PLAN: Patient will begin applying topical creams to his incision. We will check a TSH level before his next office visit in 6 weeks.  [name], MD, [facility]   Visit Diagnoses: 1. GOITER, MULTINODULAR   2. 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

## 2013-04-10 NOTE — Telephone Encounter (Signed)
Lab slip given to pt for tsh in 6 weeks prior to next office visit. Pt states understanding.

## 2013-04-10 NOTE — Patient Instructions (Signed)
  COCOA BUTTER & VITAMIN E CREAM  (Palmer[employer] or other brand)  Apply cocoa butter/vitamin E cream to your incision 2 - 3 times daily.  Massage cream into incision for one minute with each application.  Use sunscreen (50 SPF or higher) for first 6 months after surgery if area is exposed to sun.  You may substitute Mederma or other scar reducing creams as desired.   

## 2013-04-22 ENCOUNTER — Telehealth: Payer: Self-pay | Admitting: Pharmacist

## 2013-04-22 MED ORDER — LINAGLIPTIN-METFORMIN HCL 2.5-1000 MG PO TABS: 1.0000 | ORAL_TABLET | Freq: Two times a day (BID) | ORAL | Status: DC

## 2013-04-22 NOTE — Telephone Encounter (Signed)
#  70 sample of Jentudueto 2.5/1000mg  tablet left at front desk and patient notified

## 2013-04-23 ENCOUNTER — Telehealth: Payer: Self-pay | Admitting: Family Medicine

## 2013-04-25 ENCOUNTER — Telehealth: Payer: Self-pay | Admitting: Family Medicine

## 2013-04-25 DIAGNOSIS — F32A Depression, unspecified: Secondary | ICD-10-CM

## 2013-04-25 DIAGNOSIS — F329 Major depressive disorder, single episode, unspecified: Secondary | ICD-10-CM

## 2013-04-29 MED ORDER — METOPROLOL TARTRATE 25 MG PO TABS: 12.5000 mg | ORAL_TABLET | Freq: Two times a day (BID) | ORAL | Status: DC

## 2013-04-29 MED ORDER — CITALOPRAM HYDROBROMIDE 20 MG PO TABS: 20.0000 mg | ORAL_TABLET | Freq: Every day | ORAL | Status: DC

## 2013-04-29 NOTE — Telephone Encounter (Signed)
done

## 2013-05-02 ENCOUNTER — Encounter: Payer: Self-pay | Admitting: *Deleted

## 2013-05-06 DIAGNOSIS — E1149 Type 2 diabetes mellitus with other diabetic neurological complication: Secondary | ICD-10-CM | POA: Diagnosis not present

## 2013-05-06 DIAGNOSIS — E119 Type 2 diabetes mellitus without complications: Secondary | ICD-10-CM | POA: Diagnosis not present

## 2013-05-06 DIAGNOSIS — E1159 Type 2 diabetes mellitus with other circulatory complications: Secondary | ICD-10-CM | POA: Diagnosis not present

## 2013-05-08 ENCOUNTER — Other Ambulatory Visit: Payer: Self-pay | Admitting: *Deleted

## 2013-05-08 ENCOUNTER — Telehealth: Payer: Self-pay | Admitting: Family Medicine

## 2013-05-08 DIAGNOSIS — E039 Hypothyroidism, unspecified: Secondary | ICD-10-CM

## 2013-05-08 DIAGNOSIS — E785 Hyperlipidemia, unspecified: Secondary | ICD-10-CM

## 2013-05-08 MED ORDER — GLYBURIDE 5 MG PO TABS: 5.0000 mg | ORAL_TABLET | Freq: Two times a day (BID) | ORAL | Status: DC

## 2013-05-08 NOTE — Telephone Encounter (Signed)
Pt last labs has note about thyroid surgery so I am routing med refill to you to review.

## 2013-05-08 NOTE — Telephone Encounter (Signed)
done

## 2013-05-09 MED ORDER — LEVOTHYROXINE SODIUM 88 MCG PO TABS: 88.0000 ug | ORAL_TABLET | Freq: Every day | ORAL | Status: DC

## 2013-05-09 MED ORDER — SIMVASTATIN 80 MG PO TABS: ORAL_TABLET | ORAL | Status: DC

## 2013-05-09 NOTE — Telephone Encounter (Signed)
Call patient : Prescription refilled & sent to pharmacy in [place]. 

## 2013-05-17 ENCOUNTER — Telehealth: Payer: Self-pay | Admitting: Family Medicine

## 2013-05-17 NOTE — Telephone Encounter (Signed)
Forwarded to Dr. Jacelyn Grip for recommendations.

## 2013-05-17 NOTE — Telephone Encounter (Signed)
Blood sugar has been running high over the past couple weeks. CBG was 196 this morning and 346 last night.

## 2013-05-18 NOTE — Telephone Encounter (Signed)
Patient has an appointment with the clinical pharmacist

## 2013-05-20 ENCOUNTER — Ambulatory Visit: Payer: Medicare Other | Admitting: Family Medicine

## 2013-05-20 NOTE — Telephone Encounter (Signed)
Patient called and appt's offered for tomorrow or Wednesday.  Patient wanted appt 05/21/13 at 10:20am.

## 2013-05-21 ENCOUNTER — Encounter: Payer: Self-pay | Admitting: Pharmacist Clinician (PhC)/ Clinical Pharmacy Specialist

## 2013-05-21 ENCOUNTER — Ambulatory Visit (INDEPENDENT_AMBULATORY_CARE_PROVIDER_SITE_OTHER): Payer: Medicare Other | Admitting: Pharmacist Clinician (PhC)/ Clinical Pharmacy Specialist

## 2013-05-21 ENCOUNTER — Telehealth: Payer: Self-pay | Admitting: Family Medicine

## 2013-05-21 DIAGNOSIS — E1065 Type 1 diabetes mellitus with hyperglycemia: Secondary | ICD-10-CM | POA: Diagnosis not present

## 2013-05-21 DIAGNOSIS — E108 Type 1 diabetes mellitus with unspecified complications: Principal | ICD-10-CM

## 2013-05-21 DIAGNOSIS — IMO0002 Reserved for concepts with insufficient information to code with codable children: Secondary | ICD-10-CM | POA: Diagnosis not present

## 2013-05-21 DIAGNOSIS — I6529 Occlusion and stenosis of unspecified carotid artery: Secondary | ICD-10-CM | POA: Diagnosis not present

## 2013-05-21 MED ORDER — ALBIGLUTIDE 30 MG ~~LOC~~ PEN: 30.0000 mg | PEN_INJECTOR | SUBCUTANEOUS | Status: DC

## 2013-05-21 NOTE — Progress Notes (Signed)
   Subjective:    Patient ID: JEDAIAH RATHBUN, male    DOB: [birth year], 68 y.o.   MRN: [id number]  HPI    Review of Systems  Constitutional: Negative.   Eyes: Negative.   Endocrine: Positive for polyuria.  Musculoskeletal: Positive for back pain.  Skin: Negative.   Allergic/Immunologic: Negative.   Neurological: Negative.   Psychiatric/Behavioral: Negative.        Objective:   Physical Exam  Constitutional: He is oriented to person, place, and time. He appears well-developed and well-nourished.  Neurological: He is alert and oriented to person, place, and time.          Assessment & Plan:   Diabetes Follow-Up Visit Chief Complaint:  No chief complaint on file.    Exam Regularity:  RRR Edema:  neg  Polyuria:  pos  Polydipsia:  neg Polyphagia:  neg  BMI:  There is no weight on file to calculate BMI.   Weight changes:  316lb General Appearance:  alert, oriented, no acute distress, well nourished and obese Mood/Affect:  normal  HPI:  Long standing type 2 diabetes with previous good control  Low fat/carbohydrate diet?  No Nicotine Abuse?  No Medication Compliance?  Yes Exercise?  No Alcohol Abuse?  No  Home BG Monitoring:  Checking 2 times a day. Average:  220  High: 390  Low:  186   Lab Results  Component Value Date   HGBA1C 7.3 03/15/2013    Lab Results  Component Value Date   MICROALBUR 1.50 04/16/2012    Lab Results  Component Value Date   CHOL 141 11/02/2012   HDL 41 03/15/2013   LDLCALC 90 03/15/2013   TRIG 105 03/15/2013   CHOLHDL 3.7 03/15/2013      Assessment: 1.  Diabetes. Worsening control over the past two weeks with BG ranging from 186-390 2.  Blood Pressure.  At goal of < 140/80 3.  Lipids.  Reviewed goals with patient 4.  Foot Care.  Reviewed daily care 5.  Dental Care.  annual 6.  Eye Care/Exam.  annual  Recommendations: 1.  Patient is counseled on appropriate foot care. 2.  BP goal < 130/80. 3.  LDL goal of < 100, HDL > 40 and  TG < 150. 4.  Eye Exam yearly and Dental Exam every 6 months. 5.  Dietary recommendations:  1700 calorie diet extensively reviewed carbs with patient and meal planning 6.  Physical Activity recommendations:  Chair exercises and walking as tolerated 7.  Medication recommendations at this time are as follows:  Start Tanzeum weekly 30mg .  Showed patient in office how to mix and inject medication.  Reviewed side effects.  Patient has no thyroid.  Plan to cut back on insulin if response is good. 8.  Return to clinic in 2 wks   Time spent counseling patient:  45 minutes  Physician time spent with patient:  0 Referring provider:  Jacelyn Grip   PharmD:  Memory Argue, [facility]

## 2013-05-22 ENCOUNTER — Telehealth: Payer: Self-pay | Admitting: Pharmacist

## 2013-05-22 ENCOUNTER — Telehealth: Payer: Self-pay | Admitting: Family Medicine

## 2013-05-22 MED ORDER — GLUCOSE BLOOD VI STRP: ORAL_STRIP | Status: DC

## 2013-05-22 NOTE — Telephone Encounter (Signed)
Patient has questions about mixing and injecting Tanzeum that he was started on yesterday.  I talked patient through process over phone and he injected first dose.  He will call if he has further questions.

## 2013-05-22 NOTE — Telephone Encounter (Signed)
Patient was unsure if he gave injeciton of Tandzem this morning after all.   Since he is unsure - I recommended patient come in next week and we will do first injection together in office.

## 2013-05-22 NOTE — Telephone Encounter (Signed)
done

## 2013-05-23 DIAGNOSIS — E890 Postprocedural hypothyroidism: Secondary | ICD-10-CM | POA: Diagnosis not present

## 2013-05-24 LAB — TSH: TSH: 36.885 u[IU]/mL — AB (ref 0.350–4.500)

## 2013-05-29 ENCOUNTER — Encounter (INDEPENDENT_AMBULATORY_CARE_PROVIDER_SITE_OTHER): Payer: Self-pay | Admitting: Surgery

## 2013-05-29 ENCOUNTER — Ambulatory Visit (INDEPENDENT_AMBULATORY_CARE_PROVIDER_SITE_OTHER): Payer: Medicare Other | Admitting: Surgery

## 2013-05-29 VITALS — BP 130/70 | HR 76 | Temp 97.7°F | Resp 16 | Ht 73.0 in | Wt 310.8 lb

## 2013-05-29 DIAGNOSIS — E039 Hypothyroidism, unspecified: Secondary | ICD-10-CM

## 2013-05-29 DIAGNOSIS — E890 Postprocedural hypothyroidism: Secondary | ICD-10-CM

## 2013-05-29 MED ORDER — LEVOTHYROXINE SODIUM 88 MCG PO TABS: 125.0000 ug | ORAL_TABLET | Freq: Every day | ORAL | Status: DC

## 2013-05-29 NOTE — Progress Notes (Signed)
General Surgery [facility]  Chief Complaint  Patient presents with  . Routine Post Op    total thyroidectomy in Feb 2015    HISTORY: Patient is a 68 year old male who underwent total thyroidectomy in February 2015. Postoperatively he has done very well. He returns today for final wound check. Recent laboratory studies however show an elevated TSH of 36.885 on his present dose of Synthroid 88 mcg daily.  EXAM: Surgical wound is healed nicely with a good cosmetic result. No palpable masses in the thyroid bed. Voice quality is normal.  IMPRESSION: #1 status post total thyroidectomy #2 iatrogenic hypothyroidism  PLAN: I discussed the laboratory studies with the patient. I have asked him to begin taking 1-1/2 tablets daily of his current prescription. I have changed his prescription to levothyroxin 125 mcg daily. I will ask his primary care physician to check his TSH level in 6 weeks and continue to adjust his medication accordingly until his TSH is back within the normal range.  Patient will return for surgical care as needed.  [name], MD, [facility]   Visit Diagnoses: 1. HYPOTHYROIDISM, POST-RADIATION   2. Unspecified hypothyroidism

## 2013-05-29 NOTE — Patient Instructions (Signed)
Take 1-1/2 tablet of levothyroxine 88 mcg daily.  New prescription for levothyroxin will be 125 mcg daily.  Go to primary care physician for TSH level in 6 weeks.  [name], MD, [facility] Office: [phone]

## 2013-05-31 ENCOUNTER — Telehealth: Payer: Self-pay | Admitting: Nurse Practitioner

## 2013-05-31 DIAGNOSIS — E785 Hyperlipidemia, unspecified: Secondary | ICD-10-CM

## 2013-06-04 MED ORDER — SIMVASTATIN 40 MG PO TABS: 40.0000 mg | ORAL_TABLET | Freq: Every day | ORAL | Status: DC

## 2013-06-04 NOTE — Telephone Encounter (Signed)
done

## 2013-06-14 ENCOUNTER — Ambulatory Visit: Payer: Medicare Other | Admitting: General Practice

## 2013-06-14 ENCOUNTER — Ambulatory Visit (INDEPENDENT_AMBULATORY_CARE_PROVIDER_SITE_OTHER): Payer: Medicare Other | Admitting: Nurse Practitioner

## 2013-06-14 ENCOUNTER — Encounter: Payer: Self-pay | Admitting: Nurse Practitioner

## 2013-06-14 VITALS — BP 135/73 | HR 76 | Temp 97.9°F | Ht 73.0 in | Wt 310.0 lb

## 2013-06-14 DIAGNOSIS — I10 Essential (primary) hypertension: Secondary | ICD-10-CM

## 2013-06-14 DIAGNOSIS — E039 Hypothyroidism, unspecified: Secondary | ICD-10-CM | POA: Diagnosis not present

## 2013-06-14 DIAGNOSIS — E876 Hypokalemia: Secondary | ICD-10-CM

## 2013-06-14 DIAGNOSIS — Z954 Presence of other heart-valve replacement: Secondary | ICD-10-CM

## 2013-06-14 DIAGNOSIS — E109 Type 1 diabetes mellitus without complications: Secondary | ICD-10-CM

## 2013-06-14 DIAGNOSIS — E890 Postprocedural hypothyroidism: Secondary | ICD-10-CM

## 2013-06-14 DIAGNOSIS — D649 Anemia, unspecified: Secondary | ICD-10-CM

## 2013-06-14 DIAGNOSIS — F3289 Other specified depressive episodes: Secondary | ICD-10-CM

## 2013-06-14 DIAGNOSIS — I6529 Occlusion and stenosis of unspecified carotid artery: Secondary | ICD-10-CM

## 2013-06-14 DIAGNOSIS — E7800 Pure hypercholesterolemia, unspecified: Secondary | ICD-10-CM

## 2013-06-14 DIAGNOSIS — N400 Enlarged prostate without lower urinary tract symptoms: Secondary | ICD-10-CM

## 2013-06-14 DIAGNOSIS — Z125 Encounter for screening for malignant neoplasm of prostate: Secondary | ICD-10-CM | POA: Diagnosis not present

## 2013-06-14 DIAGNOSIS — F329 Major depressive disorder, single episode, unspecified: Secondary | ICD-10-CM

## 2013-06-14 DIAGNOSIS — K219 Gastro-esophageal reflux disease without esophagitis: Secondary | ICD-10-CM

## 2013-06-14 LAB — POCT UA - MICROALBUMIN: Microalbumin Ur, POC: NEGATIVE mg/L

## 2013-06-14 LAB — POCT GLYCOSYLATED HEMOGLOBIN (HGB A1C): Hemoglobin A1C: 7.4

## 2013-06-14 NOTE — Progress Notes (Signed)
Subjective:    Patient ID: [name], male    DOB: 11/23/1945, 68 y.o.   MRN: [id number]  Hypertension This is a chronic problem. The current episode started more than 1 year ago. The problem is unchanged. The problem is controlled. Pertinent negatives include no anxiety, chest pain, headaches, neck pain, palpitations, PND or sweats. There are no associated agents to hypertension. Risk factors for coronary artery disease include diabetes mellitus, dyslipidemia, family history, obesity and male gender. Past treatments include beta blockers, ACE inhibitors and diuretics. The current treatment provides moderate improvement. Compliance problems include diet and exercise.  Hypertensive end-organ damage includes CAD/MI and a thyroid problem.  Hyperlipidemia This is a chronic problem. The current episode started more than 1 year ago. The problem is uncontrolled. Recent lipid tests were reviewed and are high. Exacerbating diseases include diabetes, hypothyroidism and obesity. Pertinent negatives include no chest pain. Current antihyperlipidemic treatment includes statins. The current treatment provides moderate improvement of lipids. Compliance problems include adherence to diet and adherence to exercise.  Risk factors for coronary artery disease include diabetes mellitus, dyslipidemia, family history, male sex, hypertension and obesity.  Diabetes He presents for his follow-up diabetic visit. He has type 2 diabetes mellitus. No MedicAlert identification noted. His disease course has been stable. Pertinent negatives for hypoglycemia include no headaches or sweats. Pertinent negatives for diabetes include no chest pain, no visual change and no weight loss. Current diabetic treatment includes insulin injections and oral agent (triple therapy). His weight is stable. When asked about meal planning, he reported none. He has not had a previous visit with a dietician. He participates in exercise weekly. His home  blood glucose trend is fluctuating minimally. His breakfast blood glucose is taken between 8-9 am. His breakfast blood glucose range is generally 180-200 mg/dl. His dinner blood glucose range is generally >200 mg/dl. His overall blood glucose range is 180-200 mg/dl. An ACE inhibitor/angiotensin II receptor blocker is being taken. He sees a podiatrist.Eye exam is current.  Thyroid Problem Presents for follow-up (medication was just increased to 166mg daily.) visit. Patient reports no cold intolerance, diaphoresis, diarrhea, dry skin, palpitations, visual change, weight gain or weight loss. The symptoms have been stable. His past medical history is significant for diabetes and hyperlipidemia.  depression Patient currently on celexa- which is working well to keep hi from worrying so  Much.   Review of Systems  Constitutional: Negative for weight loss, weight gain and diaphoresis.  HENT: Negative.   Respiratory: Negative.   Cardiovascular: Negative for chest pain, palpitations and PND.  Gastrointestinal: Negative for diarrhea.  Endocrine: Negative for cold intolerance.  Genitourinary: Negative.   Musculoskeletal: Negative for neck pain.  Neurological: Negative for headaches.  Psychiatric/Behavioral: Negative.        Objective:   Physical Exam  Constitutional: He is oriented to person, place, and time. He appears well-developed and well-nourished.  HENT:  Head: Normocephalic.  Right Ear: External ear normal.  Left Ear: External ear normal.  Nose: Nose normal.  Mouth/Throat: Oropharynx is clear and moist.  Eyes: EOM are normal. Pupils are equal, round, and reactive to light.  Neck: Normal range of motion. Neck supple. No JVD present. No thyromegaly present.  Cardiovascular: Normal rate, regular rhythm and intact distal pulses.  Exam reveals no gallop and no friction rub.   Murmur (2/6 systolic) heard. Pulmonary/Chest: Effort normal and breath sounds normal. No respiratory distress. He  has no wheezes. He has no rales. He exhibits no tenderness.  Abdominal:  Soft. Bowel sounds are normal. He exhibits no mass. There is no tenderness.  Musculoskeletal: Normal range of motion. He exhibits no edema.  Lymphadenopathy:    He has no cervical adenopathy.  Neurological: He is alert and oriented to person, place, and time. No cranial nerve deficit.  Skin: Skin is warm and dry.  Psychiatric: He has a normal mood and affect. His behavior is normal. Judgment and thought content normal.    BP 135/73  Pulse 76  Temp(Src) 97.9 F (36.6 C) (Oral)  Ht '6\' 1"'  (1.854 m)  Wt 310 lb (140.615 kg)  BMI 40.91 kg/m2  Results for orders placed in visit on 06/14/13  POCT GLYCOSYLATED HEMOGLOBIN (HGB A1C)      Result Value Ref Range   Hemoglobin A1C 7.4%          Assessment & Plan:   1. HYPERCHOLESTEROLEMIA   2. HYPERTENSION   3. IDDM   4. Unspecified hypothyroidism   5. HYPOKALEMIA   6. HYPOTHYROIDISM, POST-RADIATION   7. GERD (gastroesophageal reflux disease)   8. DEPRESSION   9. AORTIC VALVE REPLACEMENT, HX OF   10. BPH (benign prostatic hypertrophy)   11. Anemia    Orders Placed This Encounter  Procedures  . CMP14+EGFR  . NMR, lipoprofile  . TSH  . PSA, total and free  . Thyroid Panel With TSH  . POCT glycosylated hemoglobin (Hb A1C)    Encouraged carb counting Labs pending Health maintenance reviewed Diet and exercise encouraged Continue all meds Follow up  In 3 months   [place], FNP

## 2013-06-14 NOTE — Patient Instructions (Signed)

## 2013-06-14 NOTE — Addendum Note (Signed)
Addended by: Chevis Pretty on: 06/14/2013 09:53 AM   Modules accepted: Orders

## 2013-06-15 LAB — CMP14+EGFR
ALT: 25 IU/L (ref 0–44)
AST: 22 IU/L (ref 0–40)
Albumin/Globulin Ratio: 1.8 (ref 1.1–2.5)
Albumin: 4.1 g/dL (ref 3.6–4.8)
Alkaline Phosphatase: 52 IU/L (ref 39–117)
BUN/Creatinine Ratio: 15 (ref 10–22)
BUN: 20 mg/dL (ref 8–27)
CO2: 24 mmol/L (ref 18–29)
Calcium: 9.4 mg/dL (ref 8.6–10.2)
Chloride: 96 mmol/L — ABNORMAL LOW (ref 97–108)
Creatinine, Ser: 1.3 mg/dL — ABNORMAL HIGH (ref 0.76–1.27)
GFR calc Af Amer: 64 mL/min/{1.73_m2} (ref >59)
GFR calc non Af Amer: 56 mL/min/{1.73_m2} — ABNORMAL LOW (ref >59)
Globulin, Total: 2.3 g/dL (ref 1.5–4.5)
Glucose: 230 mg/dL — ABNORMAL HIGH (ref 65–99)
Potassium: 5.5 mmol/L — ABNORMAL HIGH (ref 3.5–5.2)
Sodium: 137 mmol/L (ref 134–144)
Total Bilirubin: 0.4 mg/dL (ref 0.0–1.2)
Total Protein: 6.4 g/dL (ref 6.0–8.5)

## 2013-06-15 LAB — NMR, LIPOPROFILE
Cholesterol: 144 mg/dL (ref 100–199)
HDL Cholesterol by NMR: 45 mg/dL (ref >39)
HDL Particle Number: 34.6 umol/L (ref >=30.5)
LDL PARTICLE NUMBER: 1025 nmol/L — AB (ref <1000)
LDL Size: 20.5 nm (ref >20.5)
LDLC SERPL CALC-MCNC: 79 mg/dL (ref 0–99)
LP-IR Score: 52 — ABNORMAL HIGH (ref <=45)
Small LDL Particle Number: 463 nmol/L (ref <=527)
Triglycerides by NMR: 101 mg/dL (ref 0–149)

## 2013-06-15 LAB — PSA, TOTAL AND FREE
PSA, Free Pct: 50 %
PSA, Free: 0.1 ng/mL
PSA: 0.2 ng/mL (ref 0.0–4.0)

## 2013-06-15 LAB — TSH: TSH: 24.36 u[IU]/mL — AB (ref 0.450–4.500)

## 2013-06-18 ENCOUNTER — Telehealth: Payer: Self-pay | Admitting: Nurse Practitioner

## 2013-06-19 ENCOUNTER — Other Ambulatory Visit: Payer: Self-pay | Admitting: Nurse Practitioner

## 2013-06-19 DIAGNOSIS — E039 Hypothyroidism, unspecified: Secondary | ICD-10-CM

## 2013-06-19 MED ORDER — LEVOTHYROXINE SODIUM 112 MCG PO TABS: 112.0000 ug | ORAL_TABLET | Freq: Every day | ORAL | Status: DC

## 2013-06-19 NOTE — Telephone Encounter (Signed)
Please review labs for patient

## 2013-07-02 ENCOUNTER — Ambulatory Visit: Payer: Medicare Other | Admitting: Endocrinology

## 2013-07-05 ENCOUNTER — Telehealth: Payer: Self-pay | Admitting: Physician Assistant

## 2013-07-05 NOTE — Telephone Encounter (Signed)
Patient called wanting to know what a seizure was. I explained to patient what a seizure is.

## 2013-07-22 ENCOUNTER — Other Ambulatory Visit (HOSPITAL_COMMUNITY): Payer: Self-pay | Admitting: Cardiology

## 2013-07-22 DIAGNOSIS — I6529 Occlusion and stenosis of unspecified carotid artery: Secondary | ICD-10-CM

## 2013-07-30 ENCOUNTER — Ambulatory Visit (HOSPITAL_COMMUNITY): Payer: Medicare Other | Attending: Cardiology | Admitting: Cardiology

## 2013-07-30 ENCOUNTER — Encounter (HOSPITAL_COMMUNITY): Payer: Medicare Other

## 2013-07-30 ENCOUNTER — Encounter: Payer: Self-pay | Admitting: Endocrinology

## 2013-07-30 ENCOUNTER — Ambulatory Visit (INDEPENDENT_AMBULATORY_CARE_PROVIDER_SITE_OTHER): Payer: Medicare Other | Admitting: Endocrinology

## 2013-07-30 VITALS — BP 128/74 | HR 77 | Temp 98.5°F | Ht 73.0 in | Wt 312.0 lb

## 2013-07-30 DIAGNOSIS — E890 Postprocedural hypothyroidism: Secondary | ICD-10-CM | POA: Insufficient documentation

## 2013-07-30 DIAGNOSIS — I10 Essential (primary) hypertension: Secondary | ICD-10-CM | POA: Insufficient documentation

## 2013-07-30 DIAGNOSIS — E785 Hyperlipidemia, unspecified: Secondary | ICD-10-CM | POA: Insufficient documentation

## 2013-07-30 DIAGNOSIS — E119 Type 2 diabetes mellitus without complications: Secondary | ICD-10-CM | POA: Diagnosis not present

## 2013-07-30 DIAGNOSIS — I6529 Occlusion and stenosis of unspecified carotid artery: Secondary | ICD-10-CM | POA: Diagnosis not present

## 2013-07-30 LAB — TSH: TSH: 8.48 u[IU]/mL — ABNORMAL HIGH (ref 0.35–4.50)

## 2013-07-30 MED ORDER — LEVOTHYROXINE SODIUM 137 MCG PO TABS: 137.0000 ug | ORAL_TABLET | Freq: Every day | ORAL | Status: DC

## 2013-07-30 NOTE — Progress Notes (Signed)
Subjective:    Patient ID: [name], male    DOB: Feb 22, 1945, 68 y.o.   MRN: [id number]  HPI i treated pt for hyperthyroidism (due to a multinodular goiter) some years ago, with i-131 therapy.   He had thyroidectomy in 2015.  He takes synthroid as rx'ed.  pt states he feels well in general. Past Medical History  Diagnosis Date  . 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 BPH (benign prostatic hypertrophy) 04/15/2010  . DEPRESSION 01/22/2009  . HYPERCHOLESTEROLEMIA 01/22/2009  . 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 Morbid obesity   . Heart valve replaced   . Colon polyps   . Ulcer   . Heart murmur   . HYPERTENSION 01/22/2009    dr Casimer Lanius  . Arthritis   . Tremors of nervous system     ?ptsd    Past Surgical History  Procedure Laterality Date  . Appendectomy    . Aortic valve replacement  July 2006    #20 stentless [place] porcine valve  . Dental surgery  05/2004    Dental extractions  . Heel spur surgery      resection of heel spur  . Colonoscopy  04/24/2007    [name]: normal  . Tonsillectomy    . Bilateral cataract surg    . Colonoscopy with esophagogastroduodenoscopy (egd) N/A 04/05/2012    Procedure: COLONOSCOPY WITH ESOPHAGOGASTRODUODENOSCOPY (EGD);  Surgeon: Daneil Dolin, MD;  Location: [facility];  Service: Endoscopy;  Laterality: N/A;  10;15  . Thyroidectomy  03/21/2013    DR [name]  . Thyroidectomy N/A 03/21/2013    Procedure: THYROIDECTOMY;  Surgeon: [name], MD;  Location: [facility];  Service: General;  Laterality: N/A;    History   Social History  . Marital Status: Single    Spouse Name: N/A    Number of Children: 1  . Years of Education: HS   Occupational History  . Truck Geophysicist/field seismologist, retired    Social History Main Topics  . Smoking status: Former Smoker    Quit date: 09/20/1961  . Smokeless tobacco: Never Used  . Alcohol Use: No  . Drug Use: No  . Sexual Activity: Not on file   Other Topics Concern  . Not on file   Social History Narrative   Lives alone.   Quit smoking approximately 28 years ago.   Long haul truck driver, lives in [place].    Current Outpatient Prescriptions on File Prior to Visit  Medication Sig Dispense Refill  . acarbose (PRECOSE) 100 MG tablet Take 1 tablet (100 mg total) by mouth 2 (two) times daily.  180 tablet  1  . Albiglutide (TANZEUM) 30 MG PEN Inject 30 mg into the skin once a week.  2 each  0  . aspirin 81 MG tablet Take 81 mg by mouth daily.        . calcium-vitamin D (OSCAL WITH D) 500-200 MG-UNIT per tablet Take 2 tablets by mouth 2 (two) times daily.  60 tablet  1  . citalopram (CELEXA) 20 MG tablet Take 1 tablet (20 mg total) by mouth daily.  30 tablet  1  . furosemide (LASIX) 40 MG tablet Take 1 tablet (40 mg total) by mouth daily.  30 tablet  4  . glucose blood (BAYER CONTOUR NEXT TEST) test strip Test qid Dx 250.93 EZ strips  100 each  2  . glyBURIDE (DIABETA) 5 MG tablet Take 1 tablet (5 mg total) by mouth 2 (two) times daily.  180 tablet  0  .  insulin detemir (LEVEMIR) 100 UNIT/ML injection Inject 0.8 mLs (80 Units total) into the skin 2 (two) times daily.  10 mL  5  . Linagliptin-Metformin HCl 2.05-998 MG TABS Take 1 tablet by mouth 2 (two) times daily.  70 tablet  0  . lisinopril (PRINIVIL,ZESTRIL) 20 MG tablet Take 1 tablet (20 mg total) by mouth daily.  30 tablet  4  . metoprolol tartrate (LOPRESSOR) 25 MG tablet Take 0.5 tablets (12.5 mg total) by mouth 2 (two) times daily.  60 tablet  1  . Multiple Vitamins-Minerals (MENS MULTI VITAMIN & MINERAL PO) Take 1 tablet by mouth daily.        Marland Kitchen nystatin (MYCOSTATIN) powder Apply 1 g topically daily as needed (rash).       . Omega-3 Fatty Acids (FISH OIL) 1200 MG CAPS Take 1-2 capsules by mouth 2 (two) times daily. 1 capsule by mouth q am, 2 capsules by mouth q pm      . simvastatin (ZOCOR) 40 MG tablet Take 1 tablet (40 mg total) by mouth daily at 6 PM.  90 tablet  0   No current facility-administered medications on file prior to visit.     Allergies  Allergen Reactions  . Phenothiazines Anaphylaxis  . Pioglitazone Swelling    Family History  Problem Relation Age of Onset  . Diabetes      BP 128/74  Pulse 77  Temp(Src) 98.5 F (36.9 C) (Oral)  Ht 6\' 1"  (1.854 m)  Wt 312 lb (141.522 kg)  BMI 41.17 kg/m2  SpO2 96%  Review of Systems He has chronic weight gain.     Objective:   Physical Exam VITAL SIGNS:  See vs page GENERAL: no distress.  Morbid obesity. Neck: a healed scar is present.  i do not appreciate a nodule in the thyroid or elsewhere in the neck.     Lab Results  Component Value Date   TSH 8.48* 07/30/2013      Assessment & Plan:  Postsurgical hypothyroidism: he needs increased rx   Patient is advised the following: Patient Instructions  blood tests are being requested for you today.  We'll contact you with results.   I would be happy to see you back here whenever you want.

## 2013-07-30 NOTE — Patient Instructions (Addendum)
blood tests are being requested for you today.  We'll contact you with results. I would be happy to see you back here whenever you want.   

## 2013-07-30 NOTE — Progress Notes (Signed)
Carotid duplex performed 

## 2013-07-31 ENCOUNTER — Telehealth: Payer: Self-pay

## 2013-07-31 ENCOUNTER — Other Ambulatory Visit: Payer: Self-pay

## 2013-07-31 MED ORDER — LEVOTHYROXINE SODIUM 137 MCG PO TABS: 137.0000 ug | ORAL_TABLET | Freq: Every day | ORAL | Status: DC

## 2013-07-31 NOTE — Telephone Encounter (Signed)
Pt advised of lab results from 7/72015.

## 2013-08-01 ENCOUNTER — Telehealth: Payer: Self-pay | Admitting: Endocrinology

## 2013-08-01 DIAGNOSIS — E109 Type 1 diabetes mellitus without complications: Secondary | ICD-10-CM

## 2013-08-01 NOTE — Telephone Encounter (Signed)
See below, should this be referred to PCP? Thanks!

## 2013-08-01 NOTE — Telephone Encounter (Signed)
Pt would like call back regarding weight loss surgery

## 2013-08-01 NOTE — Addendum Note (Signed)
Addended by: Renato Shin on: 08/01/2013 12:10 PM   Modules accepted: Orders

## 2013-08-01 NOTE — Telephone Encounter (Signed)
Patient stated he would like to talk to the nurse please.  Thank you

## 2013-08-01 NOTE — Telephone Encounter (Signed)
Unable to reach pt. Will try again at a later time.  

## 2013-08-01 NOTE — Telephone Encounter (Signed)
i made the referral.  you will receive a phone call, about a day and time for an informational meeting.

## 2013-08-02 ENCOUNTER — Telehealth: Payer: Self-pay | Admitting: Endocrinology

## 2013-08-02 NOTE — Telephone Encounter (Signed)
Unable to reach pt will try again at a later time.  

## 2013-08-02 NOTE — Telephone Encounter (Signed)
Please call patient back regarding hs medication   Call back:[phone]  Thank You

## 2013-08-05 NOTE — Telephone Encounter (Signed)
Unable to reach pt will try again at a later time.  

## 2013-08-06 ENCOUNTER — Telehealth: Payer: Self-pay | Admitting: Cardiology

## 2013-08-06 NOTE — Telephone Encounter (Signed)
Contacted pt and discussed Thyroid medication. Pt wanted to verify dosages. Pt instructed that 137 mcg is the correct dosage that he should be taking.

## 2013-08-06 NOTE — Telephone Encounter (Signed)
RN notified patient. Patient verbalized understanding.

## 2013-08-06 NOTE — Telephone Encounter (Signed)
Patient wants to know from Dr [name] if he can have Bariartic surgery.  Patient states he is aware of bariatric seminar available  Informed patient will defer to Dr [name].

## 2013-08-06 NOTE — Telephone Encounter (Signed)
New Prob    Pt has some questions regarding a weight loss surgeries he is interested in and if he qualifies. Please call.

## 2013-08-06 NOTE — Telephone Encounter (Signed)
He would be OK for this.  I would like to make sure to have a preop appt with him before the actual procedure.

## 2013-08-09 ENCOUNTER — Telehealth: Payer: Self-pay | Admitting: Cardiology

## 2013-08-09 NOTE — Telephone Encounter (Signed)
New message ° ° ° ° ° ° ° ° ° °Pt returning nurses call °

## 2013-08-12 ENCOUNTER — Telehealth: Payer: Self-pay | Admitting: Nurse Practitioner

## 2013-08-12 MED ORDER — GLYBURIDE 5 MG PO TABS: 5.0000 mg | ORAL_TABLET | Freq: Two times a day (BID) | ORAL | Status: DC

## 2013-08-12 NOTE — Telephone Encounter (Signed)
done

## 2013-08-13 ENCOUNTER — Telehealth: Payer: Self-pay | Admitting: Cardiology

## 2013-08-13 ENCOUNTER — Telehealth (HOSPITAL_COMMUNITY): Payer: Self-pay | Admitting: *Deleted

## 2013-08-13 DIAGNOSIS — I6529 Occlusion and stenosis of unspecified carotid artery: Secondary | ICD-10-CM

## 2013-08-13 NOTE — Telephone Encounter (Signed)
Follow Up    Pt is calling following up on his most recent test results. Please call.

## 2013-08-13 NOTE — Telephone Encounter (Signed)
Returned call to patient 07/30/13 carotid dopplers stable.Dr.Hochrein advised to repeat in 1 year.

## 2013-08-19 ENCOUNTER — Telehealth: Payer: Self-pay | Admitting: Family Medicine

## 2013-08-19 NOTE — Telephone Encounter (Signed)
NTBS seen to discuss nad have lab work done

## 2013-08-19 NOTE — Telephone Encounter (Signed)
Wants to know with all the new medications out now if he can come off of his insulin. 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

## 2013-08-20 ENCOUNTER — Other Ambulatory Visit: Payer: Self-pay | Admitting: *Deleted

## 2013-08-20 MED ORDER — METOPROLOL TARTRATE 25 MG PO TABS: 12.5000 mg | ORAL_TABLET | Freq: Two times a day (BID) | ORAL | Status: DC

## 2013-08-20 NOTE — Telephone Encounter (Signed)
Appointment made

## 2013-08-29 DIAGNOSIS — E11349 Type 2 diabetes mellitus with severe nonproliferative diabetic retinopathy without macular edema: Secondary | ICD-10-CM | POA: Diagnosis not present

## 2013-08-29 DIAGNOSIS — E11311 Type 2 diabetes mellitus with unspecified diabetic retinopathy with macular edema: Secondary | ICD-10-CM | POA: Diagnosis not present

## 2013-08-29 DIAGNOSIS — E1139 Type 2 diabetes mellitus with other diabetic ophthalmic complication: Secondary | ICD-10-CM | POA: Diagnosis not present

## 2013-08-29 LAB — HM DIABETES EYE EXAM

## 2013-08-30 ENCOUNTER — Telehealth: Payer: Self-pay | Admitting: Nurse Practitioner

## 2013-08-30 ENCOUNTER — Encounter: Payer: Self-pay | Admitting: Nurse Practitioner

## 2013-08-30 ENCOUNTER — Ambulatory Visit (INDEPENDENT_AMBULATORY_CARE_PROVIDER_SITE_OTHER): Payer: Medicare Other | Admitting: Nurse Practitioner

## 2013-08-30 VITALS — BP 134/75 | HR 73 | Temp 97.0°F | Ht 73.0 in | Wt 309.0 lb

## 2013-08-30 DIAGNOSIS — R011 Cardiac murmur, unspecified: Secondary | ICD-10-CM

## 2013-08-30 DIAGNOSIS — E109 Type 1 diabetes mellitus without complications: Secondary | ICD-10-CM | POA: Diagnosis not present

## 2013-08-30 DIAGNOSIS — E785 Hyperlipidemia, unspecified: Secondary | ICD-10-CM | POA: Diagnosis not present

## 2013-08-30 DIAGNOSIS — E876 Hypokalemia: Secondary | ICD-10-CM | POA: Diagnosis not present

## 2013-08-30 DIAGNOSIS — K219 Gastro-esophageal reflux disease without esophagitis: Secondary | ICD-10-CM

## 2013-08-30 DIAGNOSIS — E669 Obesity, unspecified: Secondary | ICD-10-CM

## 2013-08-30 DIAGNOSIS — E890 Postprocedural hypothyroidism: Secondary | ICD-10-CM

## 2013-08-30 DIAGNOSIS — Z713 Dietary counseling and surveillance: Secondary | ICD-10-CM

## 2013-08-30 DIAGNOSIS — I6529 Occlusion and stenosis of unspecified carotid artery: Secondary | ICD-10-CM | POA: Diagnosis not present

## 2013-08-30 DIAGNOSIS — F3289 Other specified depressive episodes: Secondary | ICD-10-CM

## 2013-08-30 DIAGNOSIS — R635 Abnormal weight gain: Secondary | ICD-10-CM

## 2013-08-30 DIAGNOSIS — E1049 Type 1 diabetes mellitus with other diabetic neurological complication: Secondary | ICD-10-CM

## 2013-08-30 DIAGNOSIS — Z954 Presence of other heart-valve replacement: Secondary | ICD-10-CM

## 2013-08-30 DIAGNOSIS — I10 Essential (primary) hypertension: Secondary | ICD-10-CM

## 2013-08-30 DIAGNOSIS — F329 Major depressive disorder, single episode, unspecified: Secondary | ICD-10-CM

## 2013-08-30 DIAGNOSIS — E10610 Type 1 diabetes mellitus with diabetic neuropathic arthropathy: Secondary | ICD-10-CM

## 2013-08-30 LAB — POCT GLYCOSYLATED HEMOGLOBIN (HGB A1C): HEMOGLOBIN A1C: 7.7

## 2013-08-30 MED ORDER — LINAGLIPTIN-METFORMIN HCL 2.5-1000 MG PO TABS: 1.0000 | ORAL_TABLET | Freq: Two times a day (BID) | ORAL | Status: DC

## 2013-08-30 MED ORDER — INSULIN DETEMIR 100 UNIT/ML ~~LOC~~ SOLN: SUBCUTANEOUS | Status: DC

## 2013-08-30 NOTE — Addendum Note (Signed)
Addended by: Chevis Pretty on: 08/30/2013 09:40 AM   Modules accepted: Orders

## 2013-08-30 NOTE — Telephone Encounter (Signed)
Patient wanted to know where is rx went he is aware [facility] in [place]

## 2013-08-30 NOTE — Progress Notes (Signed)
Subjective:    Patient ID: [name], male    DOB: 10-31-1945, 68 y.o.   MRN: [id number]  Patient here today for follow up of chronic medical problems.  Hypertension This is a chronic problem. The current episode started more than 1 year ago. The problem is unchanged. The problem is controlled. Pertinent negatives include no anxiety, chest pain, headaches, neck pain, palpitations, PND or sweats. There are no associated agents to hypertension. Risk factors for coronary artery disease include diabetes mellitus, dyslipidemia, family history, obesity and male gender. Past treatments include beta blockers, ACE inhibitors and diuretics. The current treatment provides moderate improvement. Compliance problems include diet and exercise.  Hypertensive end-organ damage includes CAD/MI and a thyroid problem.  Hyperlipidemia This is a chronic problem. The current episode started more than 1 year ago. The problem is uncontrolled. Recent lipid tests were reviewed and are high. Exacerbating diseases include diabetes, hypothyroidism and obesity. Pertinent negatives include no chest pain. Current antihyperlipidemic treatment includes statins. The current treatment provides moderate improvement of lipids. Compliance problems include adherence to diet and adherence to exercise.  Risk factors for coronary artery disease include diabetes mellitus, dyslipidemia, family history, male sex, hypertension and obesity.  Diabetes He presents for his follow-up diabetic visit. He has type 2 diabetes mellitus. No MedicAlert identification noted. His disease course has been stable. Pertinent negatives for hypoglycemia include no headaches or sweats. Pertinent negatives for diabetes include no chest pain, no visual change and no weight loss. Current diabetic treatment includes insulin injections and oral agent (triple therapy). His weight is stable. When asked about meal planning, he reported none. He has not had a previous visit  with a dietician. He participates in exercise weekly. His home blood glucose trend is fluctuating minimally. His breakfast blood glucose is taken between 8-9 am. His breakfast blood glucose range is generally 140-180 mg/dl. His dinner blood glucose range is generally >200 mg/dl. His overall blood glucose range is 140-180 mg/dl. An ACE inhibitor/angiotensin II receptor blocker is being taken. He sees a podiatrist.Eye exam is current.  Thyroid Problem Presents for follow-up (medication was just increased to 125mg daily.) visit. Patient reports no cold intolerance, diaphoresis, diarrhea, dry skin, palpitations, visual change, weight gain or weight loss. The symptoms have been stable. His past medical history is significant for diabetes and hyperlipidemia.  depression Patient currently on celexa- which is working well to keep hi from worrying so  Much. Aortic valve replacement Sees cardiologist 1 x a year- doing well- no c/o chest pain Carotid bruits/occlusion U/s done 1 month ago and showed about 45 % occlusioin- Dr. HWarren Lacyis suppose to discuss with patient at visit in September.  * Cut his right wrist on trash dumpster about 3 weeks ago and is slowly healing- has  Not had tetanus shot.   Review of Systems  Constitutional: Negative for weight loss, weight gain and diaphoresis.  HENT: Negative.   Respiratory: Negative.   Cardiovascular: Negative for chest pain, palpitations and PND.  Gastrointestinal: Negative for diarrhea.  Endocrine: Negative for cold intolerance.  Genitourinary: Negative.   Musculoskeletal: Negative for neck pain.  Neurological: Negative for headaches.  Psychiatric/Behavioral: Negative.        Objective:   Physical Exam  Constitutional: He is oriented to person, place, and time. He appears well-developed and well-nourished.  HENT:  Head: Normocephalic.  Right Ear: External ear normal.  Left Ear: External ear normal.  Nose: Nose normal.  Mouth/Throat: Oropharynx  is clear and moist.  Eyes:  EOM are normal. Pupils are equal, round, and reactive to light.  Neck: Normal range of motion. Neck supple. No JVD present. No thyromegaly present.  Cardiovascular: Normal rate, regular rhythm and intact distal pulses.  Exam reveals no gallop and no friction rub.   Murmur (2/6 systolic) heard. Pulmonary/Chest: Effort normal and breath sounds normal. No respiratory distress. He has no wheezes. He has no rales. He exhibits no tenderness.  Abdominal: Soft. Bowel sounds are normal. He exhibits no mass. There is no tenderness.  Musculoskeletal: Normal range of motion. He exhibits no edema.  Lymphadenopathy:    He has no cervical adenopathy.  Neurological: He is alert and oriented to person, place, and time. No cranial nerve deficit.  Skin: Skin is warm and dry.  3cm x2cm superficial laceration to right wrist- slight surrounding erythema- no drainage  Psychiatric: He has a normal mood and affect. His behavior is normal. Judgment and thought content normal.    BP 134/75  Pulse 73  Temp(Src) 97 F (36.1 C) (Oral)  Ht _0  (1.854 m)  Wt 309 lb (140.161 kg)  BMI 40.78 kg/m2  Results for orders placed in visit on 08/30/13  POCT GLYCOSYLATED HEMOGLOBIN (HGB A1C)      Result Value Ref Range   Hemoglobin A1C 7.7           Assessment & Plan:   1. Hyperlipidemia with target LDL less than 100   2. HYPERTENSION   3. IDDM   4. HYPOKALEMIA   5. AORTIC VALVE REPLACEMENT, HX OF   6. CAROTID OCCLUSIVE DISEASE   7. DEPRESSION   8. Postsurgical hypothyroidism   9. MURMUR   10. Obesity   11. Gastroesophageal reflux disease, esophagitis presence not specified   12. 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 CMP14+EGFR  . NMR, lipoprofile  . POCT glycosylated hemoglobin (Hb A1C)   Meds ordered this encounter  Medications  . insulin detemir (LEVEMIR)  100 UNIT/ML injection    Sig: 45 u BID    Dispense:  10 mL    Refill:  5    Order Specific Question:  Supervising Provider    Answer:  Chipper Herb [[id number]]  . Linagliptin-Metformin HCl 2.05-998 MG TABS    Sig: Take 1 tablet by mouth 2 (two) times daily.    Dispense:  60 tablet    Refill:  5    Order Specific Question:  Supervising Provider    Answer:  Chipper Herb [[id number]]  wound to right arm cleaned and dressed- watch for signs of infection- rto if not healing Increase levemir to 45u BID Labs pending Health maintenance reviewed Diet and exercise encouraged Continue all meds Follow up  In 3 month   [place], FNP

## 2013-08-30 NOTE — Patient Instructions (Signed)
Open Wound, Forearm An open wound is a break in the skin because of an injury. An open wound can be a scrape, cut, or puncture to the skin. Good wound care will help to:   Reduce pain.  Prevent infection.  Reduce scaring. HOME CARE  Rest and raise (elevate) the injured arm on a pillow. This will help keep the puffiness (swelling) down.  Wash all dirt off the wound.  Clean the wounds daily with gentle soap and water.  Apply medicated cream after the wound has been cleaned or as told by your doctor.  Apply a clean bandage (dressing) daily if necessary. GET HELP RIGHT AWAY IF:   There is increased redness or puffiness in or around the wound.  Your pain increases.  You or your child has a temperature by mouth above 102 F (38.9 C), not controlled by medicine.  Your baby is older than 3 months with a rectal temperature of 102 F (38.9 C) or higher.  Your baby is 3 months old or younger with a rectal temperature of 100.4 F (38 C) or higher.  A yellowish white fluid (pus) comes from the wound.  Your pain is not controlled with pain relievers.  You cannot move or feel your fingers.  There is red streaking of the skin that goes above or below the wound. MAKE SURE YOU:   Understand these instructions.  Will watch your condition.  Will get help right away if you are not doing well or get worse. Document Released: 04/08/2008 Document Revised: 04/04/2011 Document Reviewed: 04/08/2008 ExitCare Patient Information 2015 [employer]. This information is not intended to replace advice given to you by your health care provider. Make sure you discuss any questions you have with your health care provider.  

## 2013-08-30 NOTE — Progress Notes (Deleted)
   Subjective:    Patient ID: [name], male    DOB: Jan 31, 1945, 68 y.o.   MRN: [id number]  HPI    Review of Systems     Objective:   Physical Exam        Assessment & Plan:

## 2013-08-31 ENCOUNTER — Other Ambulatory Visit: Payer: Self-pay | Admitting: Family Medicine

## 2013-08-31 ENCOUNTER — Other Ambulatory Visit: Payer: Self-pay | Admitting: Nurse Practitioner

## 2013-08-31 ENCOUNTER — Telehealth: Payer: Self-pay | Admitting: Nurse Practitioner

## 2013-08-31 LAB — CMP14+EGFR
ALK PHOS: 54 IU/L (ref 39–117)
ALT: 23 IU/L (ref 0–44)
AST: 20 IU/L (ref 0–40)
Albumin/Globulin Ratio: 1.6 (ref 1.1–2.5)
Albumin: 4 g/dL (ref 3.5–4.8)
BUN / CREAT RATIO: 15 (ref 10–22)
BUN: 19 mg/dL (ref 8–27)
CO2: 25 mmol/L (ref 18–29)
CREATININE: 1.27 mg/dL (ref 0.76–1.27)
Calcium: 9.2 mg/dL (ref 8.6–10.2)
Chloride: 96 mmol/L — ABNORMAL LOW (ref 97–108)
GFR calc Af Amer: 66 mL/min/{1.73_m2} (ref >59)
GFR calc non Af Amer: 57 mL/min/{1.73_m2} — ABNORMAL LOW (ref >59)
GLOBULIN, TOTAL: 2.5 g/dL (ref 1.5–4.5)
Glucose: 213 mg/dL — ABNORMAL HIGH (ref 65–99)
POTASSIUM: 5.4 mmol/L — AB (ref 3.5–5.2)
SODIUM: 136 mmol/L (ref 134–144)
Total Bilirubin: 0.3 mg/dL (ref 0.0–1.2)
Total Protein: 6.5 g/dL (ref 6.0–8.5)

## 2013-08-31 LAB — NMR, LIPOPROFILE
Cholesterol: 162 mg/dL (ref 100–199)
HDL Cholesterol by NMR: 42 mg/dL (ref >39)
HDL Particle Number: 32.4 umol/L (ref >=30.5)
LDL Particle Number: 1355 nmol/L — ABNORMAL HIGH (ref <1000)
LDL Size: 20.5 nm (ref >20.5)
LDLC SERPL CALC-MCNC: 95 mg/dL (ref 0–99)
LP-IR SCORE: 50 — AB (ref <=45)
SMALL LDL PARTICLE NUMBER: 670 nmol/L — AB (ref <=527)
Triglycerides by NMR: 124 mg/dL (ref 0–149)

## 2013-08-31 LAB — THYROID PANEL WITH TSH
Free Thyroxine Index: 2.2 (ref 1.2–4.9)
T3 Uptake Ratio: 31 % (ref 24–39)
T4 TOTAL: 7.2 ug/dL (ref 4.5–12.0)
TSH: 16.52 u[IU]/mL — AB (ref 0.450–4.500)

## 2013-08-31 MED ORDER — LINAGLIPTIN-METFORMIN HCL 2.5-1000 MG PO TABS: 1.0000 | ORAL_TABLET | Freq: Two times a day (BID) | ORAL | Status: DC

## 2013-08-31 MED ORDER — LEVOTHYROXINE SODIUM 150 MCG PO TABS: 150.0000 ug | ORAL_TABLET | Freq: Every day | ORAL | Status: DC

## 2013-08-31 NOTE — Telephone Encounter (Signed)
I sent the Rx to the pharmacy.

## 2013-09-02 ENCOUNTER — Encounter: Payer: Self-pay | Admitting: *Deleted

## 2013-09-02 ENCOUNTER — Telehealth: Payer: Self-pay | Admitting: Nurse Practitioner

## 2013-09-03 NOTE — Telephone Encounter (Signed)
Patient aware.

## 2013-09-11 DIAGNOSIS — E1159 Type 2 diabetes mellitus with other circulatory complications: Secondary | ICD-10-CM | POA: Diagnosis not present

## 2013-09-11 DIAGNOSIS — E119 Type 2 diabetes mellitus without complications: Secondary | ICD-10-CM | POA: Diagnosis not present

## 2013-09-11 DIAGNOSIS — E1149 Type 2 diabetes mellitus with other diabetic neurological complication: Secondary | ICD-10-CM | POA: Diagnosis not present

## 2013-09-16 ENCOUNTER — Telehealth: Payer: Self-pay | Admitting: Nurse Practitioner

## 2013-09-16 NOTE — Telephone Encounter (Signed)
Patient wanted to know if we had any samples of janumet. Patient aware we do not have this sample

## 2013-10-02 ENCOUNTER — Ambulatory Visit: Payer: Medicare Other | Admitting: Family Medicine

## 2013-10-03 ENCOUNTER — Other Ambulatory Visit: Payer: Self-pay | Admitting: *Deleted

## 2013-10-03 MED ORDER — ACARBOSE 100 MG PO TABS: 100.0000 mg | ORAL_TABLET | Freq: Two times a day (BID) | ORAL | Status: DC

## 2013-10-04 ENCOUNTER — Telehealth: Payer: Self-pay | Admitting: Nurse Practitioner

## 2013-10-04 DIAGNOSIS — I10 Essential (primary) hypertension: Secondary | ICD-10-CM

## 2013-10-04 MED ORDER — LISINOPRIL 20 MG PO TABS: 20.0000 mg | ORAL_TABLET | Freq: Every day | ORAL | Status: DC

## 2013-10-04 NOTE — Telephone Encounter (Signed)
done

## 2013-10-07 ENCOUNTER — Ambulatory Visit: Payer: Medicare Other | Admitting: Nurse Practitioner

## 2013-10-15 ENCOUNTER — Encounter: Payer: Self-pay | Admitting: Family Medicine

## 2013-10-15 ENCOUNTER — Ambulatory Visit (INDEPENDENT_AMBULATORY_CARE_PROVIDER_SITE_OTHER): Payer: Medicare Other | Admitting: Family Medicine

## 2013-10-15 VITALS — BP 162/79 | HR 73 | Temp 97.7°F | Ht 73.0 in | Wt 311.4 lb

## 2013-10-15 DIAGNOSIS — R059 Cough, unspecified: Secondary | ICD-10-CM

## 2013-10-15 DIAGNOSIS — J069 Acute upper respiratory infection, unspecified: Secondary | ICD-10-CM

## 2013-10-15 DIAGNOSIS — I6529 Occlusion and stenosis of unspecified carotid artery: Secondary | ICD-10-CM | POA: Diagnosis not present

## 2013-10-15 DIAGNOSIS — R05 Cough: Secondary | ICD-10-CM | POA: Diagnosis not present

## 2013-10-15 DIAGNOSIS — J029 Acute pharyngitis, unspecified: Secondary | ICD-10-CM

## 2013-10-15 LAB — POCT INFLUENZA A/B
Influenza A, POC: NEGATIVE
Influenza B, POC: NEGATIVE

## 2013-10-15 MED ORDER — AZITHROMYCIN 250 MG PO TABS: ORAL_TABLET | ORAL | Status: DC

## 2013-10-15 MED ORDER — BENZONATATE 100 MG PO CAPS: 100.0000 mg | ORAL_CAPSULE | Freq: Three times a day (TID) | ORAL | Status: DC | PRN

## 2013-10-15 MED ORDER — METHYLPREDNISOLONE ACETATE 80 MG/ML IJ SUSP
80.0000 mg | Freq: Once | INTRAMUSCULAR | Status: AC
Start: 2013-10-15 — End: 2013-10-15
  Administered 2013-10-15: 80 mg via INTRAMUSCULAR

## 2013-10-15 NOTE — Progress Notes (Signed)
   Subjective:    Patient ID: [name], male    DOB: Feb 07, 1945, 68 y.o.   MRN: [id number]  HPI This 68 y.o. male presents for evaluation of uri.   Review of Systems    No chest pain, SOB, HA, dizziness, vision change, N/V, diarrhea, constipation, dysuria, urinary urgency or frequency, myalgias, arthralgias or rash.  Objective:   Physical Exam Vital signs noted  Well developed well nourished male.  HEENT - Head atraumatic Normocephalic                Eyes - PERRLA, Conjuctiva - clear Sclera- Clear EOMI                Ears - EAC's Wnl TM's Wnl Gross Hearing WNL                Nose - Nares patent                 Throat - oropharanx wnl Respiratory - Lungs CTA bilateral Cardiac - RRR S1 and S2 without murmur GI - Abdomen soft Nontender and bowel sounds active x 4 Extremities - No edema. Neuro - Grossly intact.       Assessment & Plan:  Sore throat - Plan: POCT Influenza A/B, POCT rapid strep A, azithromycin (ZITHROMAX) 250 MG tablet, methylPREDNISolone acetate (DEPO-MEDROL) injection 80 mg, benzonatate (TESSALON PERLES) 100 MG capsule  Cough - Plan: POCT Influenza A/B, POCT rapid strep A, azithromycin (ZITHROMAX) 250 MG tablet, methylPREDNISolone acetate (DEPO-MEDROL) injection 80 mg, benzonatate (TESSALON PERLES) 100 MG capsule  URI (upper respiratory infection) - Plan: azithromycin (ZITHROMAX) 250 MG tablet, methylPREDNISolone acetate (DEPO-MEDROL) injection 80 mg, benzonatate (TESSALON PERLES) 100 MG capsule  Push po fluids, rest, tylenol and motrin otc prn as directed for fever, arthralgias, and myalgias.  Follow up prn if sx's continue or persist.  Lysbeth Penner FNP

## 2013-10-18 ENCOUNTER — Encounter: Payer: Self-pay | Admitting: *Deleted

## 2013-10-25 ENCOUNTER — Telehealth: Payer: Self-pay | Admitting: Nurse Practitioner

## 2013-10-28 ENCOUNTER — Other Ambulatory Visit: Payer: Self-pay | Admitting: *Deleted

## 2013-10-28 MED ORDER — GLUCOSE BLOOD VI STRP: ORAL_STRIP | Status: DC

## 2013-10-29 NOTE — Telephone Encounter (Signed)
Done 10/28/13

## 2013-10-30 DIAGNOSIS — Z23 Encounter for immunization: Secondary | ICD-10-CM | POA: Diagnosis not present

## 2013-11-06 ENCOUNTER — Other Ambulatory Visit: Payer: Self-pay | Admitting: Nurse Practitioner

## 2013-11-07 ENCOUNTER — Telehealth: Payer: Self-pay | Admitting: Nurse Practitioner

## 2013-11-07 NOTE — Telephone Encounter (Signed)
Spoke with patient and he wanted to know if diastolic reading of 72 was a good number. Patient advised this was a good reading for the diastolic

## 2013-11-14 ENCOUNTER — Telehealth: Payer: Self-pay | Admitting: Nurse Practitioner

## 2013-11-14 NOTE — Telephone Encounter (Signed)
No voice mail.  There are no  Jentadueto samples.  Discount card will not work for patients on medicare.

## 2013-11-28 ENCOUNTER — Ambulatory Visit: Payer: Medicare Other | Admitting: Nurse Practitioner

## 2013-11-29 ENCOUNTER — Telehealth: Payer: Self-pay | Admitting: *Deleted

## 2013-11-29 NOTE — Telephone Encounter (Signed)
Patient aware and verbalizes understanding. 

## 2013-11-29 NOTE — Telephone Encounter (Signed)
Patient thinks he is taking too much levemir. He takes 90units BId and last night before he ate 156. He ate then took 90 units levemir and then when he went to bed around 8:30 and he felt that his blood sugar was dropping and this am blood sugar was 116. Does he need to take 90 units this am

## 2013-11-29 NOTE — Telephone Encounter (Signed)
Yes continue 90 units BID- 116 fasting blood sugar is excellent- if below 50 that is when we need to be concerned

## 2013-12-03 ENCOUNTER — Telehealth: Payer: Self-pay | Admitting: Nurse Practitioner

## 2013-12-03 NOTE — Telephone Encounter (Signed)
Spoke with patient.

## 2013-12-04 ENCOUNTER — Telehealth (INDEPENDENT_AMBULATORY_CARE_PROVIDER_SITE_OTHER): Payer: Self-pay

## 2013-12-04 NOTE — Telephone Encounter (Signed)
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 [name].  He is calling today c/o of hoarseness and a "knot" in his throat.  He states the hoarseness has been there since surgery and is now a little worse.  When he swallows he feels as if there is a lump/knot in his throat.  No sore throat and no cough.  Pt is post radiation.  He has seen his PCP, but states, "they don't know what I am talking about".  Pt would like to be seen.  Please advise.

## 2013-12-05 NOTE — Telephone Encounter (Signed)
Notified the patient we will schedule him to come in to see Dr. [name].  Will notify his nurse to find a time.

## 2013-12-05 NOTE — Telephone Encounter (Signed)
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 [name]. He is calling today c/o of hoarseness and a "knot" in his throat. He states the hoarseness has been there since surgery and is now a little worse. When he swallows he feels as if there is a lump/knot in his throat. No sore throat and no cough. Pt is post radiation. He has seen his PCP, but states, "they don't know what I am talking about". Pt would like to be seen. Please advise.      Please schedule regular appt with tmg when time available.  Will assess and discuss in office.  tmg  [name], MD, [facility] Office: [phone]

## 2013-12-06 NOTE — Telephone Encounter (Signed)
Pt given appt for Dec..

## 2013-12-09 ENCOUNTER — Encounter: Payer: Self-pay | Admitting: Nurse Practitioner

## 2013-12-09 ENCOUNTER — Ambulatory Visit (INDEPENDENT_AMBULATORY_CARE_PROVIDER_SITE_OTHER): Payer: Medicare Other | Admitting: Nurse Practitioner

## 2013-12-09 VITALS — BP 123/71 | HR 79 | Temp 97.5°F | Ht 73.0 in | Wt 316.0 lb

## 2013-12-09 DIAGNOSIS — E876 Hypokalemia: Secondary | ICD-10-CM

## 2013-12-09 DIAGNOSIS — E669 Obesity, unspecified: Secondary | ICD-10-CM

## 2013-12-09 DIAGNOSIS — E890 Postprocedural hypothyroidism: Secondary | ICD-10-CM

## 2013-12-09 DIAGNOSIS — E785 Hyperlipidemia, unspecified: Secondary | ICD-10-CM | POA: Diagnosis not present

## 2013-12-09 DIAGNOSIS — I6529 Occlusion and stenosis of unspecified carotid artery: Secondary | ICD-10-CM

## 2013-12-09 DIAGNOSIS — M720 Palmar fascial fibromatosis [Dupuytren]: Secondary | ICD-10-CM

## 2013-12-09 DIAGNOSIS — N400 Enlarged prostate without lower urinary tract symptoms: Secondary | ICD-10-CM | POA: Diagnosis not present

## 2013-12-09 DIAGNOSIS — D649 Anemia, unspecified: Secondary | ICD-10-CM | POA: Diagnosis not present

## 2013-12-09 DIAGNOSIS — F32A Depression, unspecified: Secondary | ICD-10-CM

## 2013-12-09 DIAGNOSIS — K219 Gastro-esophageal reflux disease without esophagitis: Secondary | ICD-10-CM | POA: Diagnosis not present

## 2013-12-09 DIAGNOSIS — E1040 Type 1 diabetes mellitus with diabetic neuropathy, unspecified: Secondary | ICD-10-CM | POA: Diagnosis not present

## 2013-12-09 DIAGNOSIS — E10610 Type 1 diabetes mellitus with diabetic neuropathic arthropathy: Secondary | ICD-10-CM

## 2013-12-09 DIAGNOSIS — I10 Essential (primary) hypertension: Secondary | ICD-10-CM

## 2013-12-09 DIAGNOSIS — F329 Major depressive disorder, single episode, unspecified: Secondary | ICD-10-CM

## 2013-12-09 DIAGNOSIS — M1460 Charcot's joint, unspecified site: Secondary | ICD-10-CM | POA: Diagnosis not present

## 2013-12-09 LAB — POCT GLYCOSYLATED HEMOGLOBIN (HGB A1C)
Hemoglobin A1C: 7
Hemoglobin A1C: 7

## 2013-12-09 LAB — POCT UA - MICROALBUMIN: Microalbumin Ur, POC: 20 mg/L

## 2013-12-09 NOTE — Patient Instructions (Signed)
Diabetes and Exercise Exercising regularly is important. It is not just about losing weight. It has many health benefits, such as:  Improving your overall fitness, flexibility, and endurance.  Increasing your bone density.  Helping with weight control.  Decreasing your body fat.  Increasing your muscle strength.  Reducing stress and tension.  Improving your overall health. People with diabetes who exercise gain additional benefits because exercise:  Reduces appetite.  Improves the body's use of blood sugar (glucose).  Helps lower or control blood glucose.  Decreases blood pressure.  Helps control blood lipids (such as cholesterol and triglycerides).  Improves the body's use of the hormone insulin by:  Increasing the body's insulin sensitivity.  Reducing the body's insulin needs.  Decreases the risk for heart disease because exercising:  Lowers cholesterol and triglycerides levels.  Increases the levels of good cholesterol (such as high-density lipoproteins [HDL]) in the body.  Lowers blood glucose levels. YOUR ACTIVITY PLAN  Choose an activity that you enjoy and set realistic goals. Your health care provider or diabetes educator can help you make an activity plan that works for you. Exercise regularly as directed by your health care provider. This includes:  Performing resistance training twice a week such as push-ups, sit-ups, lifting weights, or using resistance bands.  Performing 150 minutes of cardio exercises each week such as walking, running, or playing sports.  Staying active and spending no more than 90 minutes at one time being inactive. Even short bursts of exercise are good for you. Three 10-minute sessions spread throughout the day are just as beneficial as a single 30-minute session. Some exercise ideas include:  Taking the dog for a walk.  Taking the stairs instead of the elevator.  Dancing to your favorite song.  Doing an exercise  video.  Doing your favorite exercise with a friend. RECOMMENDATIONS FOR EXERCISING WITH TYPE 1 OR TYPE 2 DIABETES   Check your blood glucose before exercising. If blood glucose levels are greater than 240 mg/dL, check for urine ketones. Do not exercise if ketones are present.  Avoid injecting insulin into areas of the body that are going to be exercised. For example, avoid injecting insulin into:  The arms when playing tennis.  The legs when jogging.  Keep a record of:  Food intake before and after you exercise.  Expected peak times of insulin action.  Blood glucose levels before and after you exercise.  The type and amount of exercise you have done.  Review your records with your health care provider. Your health care provider will help you to develop guidelines for adjusting food intake and insulin amounts before and after exercising.  If you take insulin or oral hypoglycemic agents, watch for signs and symptoms of hypoglycemia. They include:  Dizziness.  Shaking.  Sweating.  Chills.  Confusion.  Drink plenty of water while you exercise to prevent dehydration or heat stroke. Body water is lost during exercise and must be replaced.  Talk to your health care provider before starting an exercise program to make sure it is safe for you. Remember, almost any type of activity is better than none. Document Released: 04/02/2003 Document Revised: 05/27/2013 Document Reviewed: 06/19/2012 ExitCare Patient Information 2015 [employer]. This information is not intended to replace advice given to you by your health care provider. Make sure you discuss any questions you have with your health care provider.  

## 2013-12-09 NOTE — Progress Notes (Signed)
Subjective:    Patient ID: [name], male    DOB: 07-29-1945, 68 y.o.   MRN: [id number]  Patient here today for follow up of chronic medical problems.  Hypertension This is a chronic problem. The current episode started more than 1 year ago. The problem is unchanged. The problem is controlled. Pertinent negatives include no anxiety, chest pain, headaches, neck pain or palpitations. Risk factors for coronary artery disease include dyslipidemia, diabetes mellitus and post-menopausal state. Past treatments include diuretics. There are no compliance problems.  Hypertensive end-organ damage includes a thyroid problem.  Hyperlipidemia This is a chronic problem. The current episode started more than 1 year ago. Pertinent negatives include no chest pain.  Diabetes He presents for his follow-up diabetic visit. Pertinent negatives for hypoglycemia include no headaches. Pertinent negatives for diabetes include no chest pain and no visual change. Risk factors for coronary artery disease include diabetes mellitus, hypertension and dyslipidemia. He sees a podiatrist.Eye exam is current.  Thyroid Problem Patient reports no cold intolerance, diaphoresis, diarrhea, palpitations or visual change. His past medical history is significant for hyperlipidemia.  depression Patient currently on celexa- which is working well to keep hi from worrying so  Much. Aortic valve replacement Sees cardiologist 1 x a year- doing well- no c/o chest pain Carotid bruits/occlusion U/s done 1 month ago and showed about 45 % occlusioin- Dr. Warren Lacy is suppose to discuss with patient at visit in September.  * Cut his right wrist on trash dumpster about 3 weeks ago and is slowly healing- has  Not had tetanus shot.   Review of Systems  Constitutional: Negative for diaphoresis.  HENT: Negative.   Respiratory: Negative.   Cardiovascular: Negative for chest pain and palpitations.  Gastrointestinal: Negative for diarrhea.    Endocrine: Negative for cold intolerance.  Genitourinary: Negative.   Musculoskeletal: Negative for neck pain.  Neurological: Negative for headaches.  Psychiatric/Behavioral: Negative.   All other systems reviewed and are negative.      Objective:   Physical Exam  Constitutional: He is oriented to person, place, and time. He appears well-developed and well-nourished.  HENT:  Head: Normocephalic.  Eyes: Pupils are equal, round, and reactive to light.  Neck: Normal range of motion.  Cardiovascular: Normal rate and regular rhythm.   Murmur heard. Pulmonary/Chest: Effort normal and breath sounds normal.  Musculoskeletal: Normal range of motion.  Neurological: He is alert and oriented to person, place, and time. He has normal reflexes.  Skin: Skin is warm.  Psychiatric: He has a normal mood and affect.    BP 123/71 mmHg  Pulse 79  Temp(Src) 97.5 F (36.4 C) (Oral)  Ht '6\' 1"'  (1.854 m)  Wt 316 lb (143.337 kg)  BMI 41.70 kg/m2  Results for orders placed or performed in visit on 12/09/13  POCT glycosylated hemoglobin (Hb A1C)  Result Value Ref Range   Hemoglobin A1C 7.0%   POCT glycosylated hemoglobin (Hb A1C)  Result Value Ref Range   Hemoglobin A1C 7.0   POCT UA - Microalbumin  Result Value Ref Range   Microalbumin Ur, POC 20 mg/L         Assessment & Plan:   1. 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 Hyperlipidemia with target LDL less than 100 Low fat diet - NMR, lipoprofile  3. Postsurgical hypothyroidism  4. Gastroesophageal reflux disease, esophagitis presence  not specified Avoid spicy and fatty foods  5. Anemia, unspecified anemia type  6. Dupuytren's contracture of both hands  7. Obesity Discussed diet and exercise for person with BMI >25 Will recheck weight in 3-6 months   8. BPH  (benign prostatic hypertrophy) Keep follow up with urologist  9. Depression Stress management  10. Essential hypertension Low Na diet  11. HYPOKALEMIA     Labs pending Health maintenance reviewed Diet and exercise encouraged Continue all meds Follow up  In 3 month   [place], FNP

## 2013-12-10 ENCOUNTER — Telehealth: Payer: Self-pay | Admitting: Nurse Practitioner

## 2013-12-10 DIAGNOSIS — I10 Essential (primary) hypertension: Secondary | ICD-10-CM

## 2013-12-10 LAB — CMP14+EGFR
ALK PHOS: 56 IU/L (ref 39–117)
ALT: 19 IU/L (ref 0–44)
AST: 20 IU/L (ref 0–40)
Albumin/Globulin Ratio: 1.8 (ref 1.1–2.5)
Albumin: 4.2 g/dL (ref 3.5–4.8)
BILIRUBIN TOTAL: 0.3 mg/dL (ref 0.0–1.2)
BUN / CREAT RATIO: 16 (ref 10–22)
BUN: 20 mg/dL (ref 8–27)
CHLORIDE: 99 mmol/L (ref 97–108)
CO2: 26 mmol/L (ref 18–29)
Calcium: 9.5 mg/dL (ref 8.6–10.2)
Creatinine, Ser: 1.24 mg/dL (ref 0.76–1.27)
GFR, EST AFRICAN AMERICAN: 68 mL/min/{1.73_m2} (ref >59)
GFR, EST NON AFRICAN AMERICAN: 59 mL/min/{1.73_m2} — AB (ref >59)
Globulin, Total: 2.3 g/dL (ref 1.5–4.5)
Glucose: 140 mg/dL — ABNORMAL HIGH (ref 65–99)
POTASSIUM: 5.6 mmol/L — AB (ref 3.5–5.2)
SODIUM: 140 mmol/L (ref 134–144)
Total Protein: 6.5 g/dL (ref 6.0–8.5)

## 2013-12-10 LAB — NMR, LIPOPROFILE
Cholesterol: 154 mg/dL (ref 100–199)
HDL Cholesterol by NMR: 49 mg/dL (ref >39)
HDL Particle Number: 33.6 umol/L (ref >=30.5)
LDL PARTICLE NUMBER: 1164 nmol/L — AB (ref <1000)
LDL SIZE: 20.4 nm (ref >20.5)
LDL-C: 85 mg/dL (ref 0–99)
LP-IR SCORE: 44 (ref <=45)
SMALL LDL PARTICLE NUMBER: 608 nmol/L — AB (ref <=527)
Triglycerides by NMR: 98 mg/dL (ref 0–149)

## 2013-12-10 LAB — MICROALBUMIN, URINE: MICROALBUM., U, RANDOM: 4.6 ug/mL (ref 0.0–17.0)

## 2013-12-10 MED ORDER — FUROSEMIDE 40 MG PO TABS: 40.0000 mg | ORAL_TABLET | Freq: Every day | ORAL | Status: DC

## 2013-12-10 NOTE — Telephone Encounter (Signed)
We will call after provider reviews.

## 2013-12-10 NOTE — Telephone Encounter (Signed)
Lasix rx sent to pharmacy

## 2014-01-01 ENCOUNTER — Telehealth: Payer: Self-pay | Admitting: Nurse Practitioner

## 2014-01-01 NOTE — Telephone Encounter (Signed)
Called pt, he will bring forms tomorrow

## 2014-01-02 ENCOUNTER — Other Ambulatory Visit (INDEPENDENT_AMBULATORY_CARE_PROVIDER_SITE_OTHER): Payer: Medicare Other

## 2014-01-02 DIAGNOSIS — E875 Hyperkalemia: Secondary | ICD-10-CM

## 2014-01-02 NOTE — Progress Notes (Signed)
Lab only 

## 2014-01-03 ENCOUNTER — Telehealth: Payer: Self-pay

## 2014-01-03 LAB — POTASSIUM: POTASSIUM: 4.9 mmol/L (ref 3.5–5.2)

## 2014-01-03 NOTE — Telephone Encounter (Signed)
Called pt.

## 2014-01-03 NOTE — Telephone Encounter (Signed)
-----   Message from Chevis Pretty, [place] sent at 01/03/2014  8:17 AM EST ----- K normal

## 2014-01-03 NOTE — Telephone Encounter (Signed)
Letter sent with results

## 2014-01-07 ENCOUNTER — Telehealth: Payer: Self-pay | Admitting: Nurse Practitioner

## 2014-01-07 NOTE — Telephone Encounter (Signed)
Patient aware of potassium results.

## 2014-01-08 DIAGNOSIS — R490 Dysphonia: Secondary | ICD-10-CM | POA: Diagnosis not present

## 2014-01-09 DIAGNOSIS — L609 Nail disorder, unspecified: Secondary | ICD-10-CM | POA: Diagnosis not present

## 2014-01-09 DIAGNOSIS — E1140 Type 2 diabetes mellitus with diabetic neuropathy, unspecified: Secondary | ICD-10-CM | POA: Diagnosis not present

## 2014-01-09 DIAGNOSIS — E1151 Type 2 diabetes mellitus with diabetic peripheral angiopathy without gangrene: Secondary | ICD-10-CM | POA: Diagnosis not present

## 2014-01-09 DIAGNOSIS — L110 Acquired keratosis follicularis: Secondary | ICD-10-CM | POA: Diagnosis not present

## 2014-01-13 ENCOUNTER — Telehealth: Payer: Self-pay | Admitting: Nurse Practitioner

## 2014-01-14 NOTE — Telephone Encounter (Signed)
MMM says pt doesn't qualify for veterens assist, he is not home bound. Pt aware

## 2014-01-20 ENCOUNTER — Ambulatory Visit (INDEPENDENT_AMBULATORY_CARE_PROVIDER_SITE_OTHER): Payer: Medicare Other | Admitting: Physician Assistant

## 2014-01-20 ENCOUNTER — Encounter: Payer: Self-pay | Admitting: Physician Assistant

## 2014-01-20 VITALS — BP 151/82 | HR 83 | Temp 97.0°F | Ht 73.0 in | Wt 313.2 lb

## 2014-01-20 DIAGNOSIS — R300 Dysuria: Secondary | ICD-10-CM | POA: Diagnosis not present

## 2014-01-20 DIAGNOSIS — I6529 Occlusion and stenosis of unspecified carotid artery: Secondary | ICD-10-CM

## 2014-01-20 LAB — POCT UA - MICROSCOPIC ONLY
Bacteria, U Microscopic: NEGATIVE
Casts, Ur, LPF, POC: NEGATIVE
Crystals, Ur, HPF, POC: NEGATIVE
MUCUS UA: NEGATIVE
RBC, urine, microscopic: NEGATIVE
Yeast, UA: NEGATIVE

## 2014-01-20 LAB — POCT URINALYSIS DIPSTICK
BILIRUBIN UA: NEGATIVE
Blood, UA: NEGATIVE
Glucose, UA: 250
Ketones, UA: NEGATIVE
NITRITE UA: NEGATIVE
PH UA: 5
Protein, UA: NEGATIVE
Spec Grav, UA: <=1.005
Urobilinogen, UA: NEGATIVE

## 2014-01-20 MED ORDER — SULFAMETHOXAZOLE-TRIMETHOPRIM 800-160 MG PO TABS: 1.0000 | ORAL_TABLET | Freq: Two times a day (BID) | ORAL | Status: DC

## 2014-01-20 NOTE — Patient Instructions (Signed)

## 2014-01-20 NOTE — Progress Notes (Signed)
Subjective:     Patient ID: [name], male   DOB: 03-20-1945, 68 y.o.   MRN: [id number]  HPI  Pt with polyuria and dysuria Some scrotal pain as well He is also have issues with the fact he cannot retract the foreskin Still able to urinate  Review of Systems  Constitutional: Negative for chills, activity change, appetite change and fatigue.  Endocrine: Positive for polyuria.  Genitourinary: Positive for urgency and testicular pain. Negative for hematuria, discharge, penile swelling, scrotal swelling and penile pain.       Objective:   Physical Exam  Constitutional: He appears well-developed and well-nourished.  Cardiovascular: Normal rate and regular rhythm.   Murmur heard. Pulmonary/Chest: Effort normal and breath sounds normal.  Genitourinary:  Unable to retract foreskin to eval penis  Vitals reviewed.      Assessment:     Dysuria Scrotal pain    Plan:     Septra DS 1 po bid x 10 days Referral to Urol for further eval F/U prn

## 2014-01-21 ENCOUNTER — Telehealth: Payer: Self-pay | Admitting: Cardiology

## 2014-01-21 NOTE — Telephone Encounter (Signed)
New problem   Pt want to know if it would be safe for him to have bariatric surgery. Please advise

## 2014-01-21 NOTE — Telephone Encounter (Signed)
Will forward to Dr Hochrein for review and recommendations  

## 2014-01-22 NOTE — Telephone Encounter (Signed)
Patient calling back about having procedure done.

## 2014-01-22 NOTE — Telephone Encounter (Signed)
Pt. Asking if its ok for him to have bariactric surgery, pt. Informed that a message has been sent to Dr. [name] and we are waiting for a response, pt. Stated understanding

## 2014-01-23 NOTE — Telephone Encounter (Signed)
We have not seen the patient in over one year. He would need to have an appt for medical clearance.

## 2014-01-27 NOTE — Telephone Encounter (Signed)
Have tried to call pt. Several times without success and not able to leave a message

## 2014-02-04 ENCOUNTER — Encounter: Payer: Self-pay | Admitting: Orthopedic Surgery

## 2014-02-04 ENCOUNTER — Ambulatory Visit (INDEPENDENT_AMBULATORY_CARE_PROVIDER_SITE_OTHER): Payer: Medicare Other | Admitting: Orthopedic Surgery

## 2014-02-04 ENCOUNTER — Other Ambulatory Visit: Payer: Self-pay | Admitting: Orthopedic Surgery

## 2014-02-04 ENCOUNTER — Ambulatory Visit (HOSPITAL_COMMUNITY)
Admission: RE | Admit: 2014-02-04 | Discharge: 2014-02-04 | Disposition: A | Discharge status: Home / Self Care | Payer: Medicare Other | Source: Ambulatory Visit | Attending: Orthopedic Surgery | Admitting: Orthopedic Surgery

## 2014-02-04 VITALS — BP 106/47 | Ht 73.0 in | Wt 313.2 lb

## 2014-02-04 DIAGNOSIS — M25561 Pain in right knee: Secondary | ICD-10-CM | POA: Diagnosis not present

## 2014-02-04 DIAGNOSIS — M25469 Effusion, unspecified knee: Secondary | ICD-10-CM | POA: Insufficient documentation

## 2014-02-04 DIAGNOSIS — M25461 Effusion, right knee: Secondary | ICD-10-CM | POA: Diagnosis not present

## 2014-02-04 NOTE — Progress Notes (Signed)
Patient ID: [name], male   DOB: [birth year], 69 y.o.   MRN: [id number]  Chief Complaint  Patient presents with  . Knee Problem    Right knee injury, DOI 12/24/13 slipped on wet floor    69 year old male multiple [facility] right knee 4 weeks ago complains of 6 out of 10 throbbing pain with stiffness. No previous treatment. X-rays negative for fracture.  System review tremors heart valve replacement shortness of breath hearing loss otherwise normal  Past Medical History  Diagnosis Date  . 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 BPH (benign prostatic hypertrophy) 04/15/2010  . DEPRESSION 01/22/2009  . HYPERCHOLESTEROLEMIA 01/22/2009  . 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 Morbid obesity   . Heart valve replaced   . Colon polyps   . Ulcer   . Heart murmur   . HYPERTENSION 01/22/2009    dr Casimer Lanius  . Arthritis   . Tremors of nervous system     ?ptsd    Past Surgical History  Procedure Laterality Date  . Appendectomy    . Aortic valve replacement  July 2006    #20 stentless [place] porcine valve  . Dental surgery  05/2004    Dental extractions  . Heel spur surgery      resection of heel spur  . Colonoscopy  04/24/2007    [name]: normal  . Tonsillectomy    . Bilateral cataract surg    . Colonoscopy with esophagogastroduodenoscopy (egd) N/A 04/05/2012    Procedure: COLONOSCOPY WITH ESOPHAGOGASTRODUODENOSCOPY (EGD);  Surgeon: Daneil Dolin, MD;  [facility];  Service: Endoscopy;  Laterality: N/A;  10;15  . Thyroidectomy  03/21/2013    DR [name]  . Thyroidectomy N/A 03/21/2013    Procedure: THYROIDECTOMY;  Surgeon: [name], MD;  Location: [place];  Service: General;  Laterality: N/A;    VS BP 106/47 mmHg  Ht 6\' 1"  (1.854 m)  Wt 313 lb 3.2 oz (142.067 kg)  BMI 41.33 kg/m2 Gen. appearance is normal The patient is alert and oriented person place and time Mood is normal affect is normal Ambulatory status normal No  gross deformities are noted.  Right knee appears to have large effusion although the left knee does 2. Knee is stable collateral ligaments are normal motor exams intact no palpable defect scans normal McMurray's negative distal dorsalis pedis pulses 2+ symmetric sensation the right lower extremity normal skin intact without redness lymph nodes negative in the right eye exhibits a laying groin  X-rays show arthritis with effusion  Diagnosis effusion  Recommend aspiration  I aspirated the nail knee got back about 7 mL seemed like there is more in their than that may be loculated  Recommend Ace wrap injection follow-up with me if no improvement.  Procedure note injection and aspiration right knee joint  Verbal consent was obtained to aspirate and inject the right knee joint   Timeout was completed to confirm the site of aspiration and injection  An 18-gauge needle was used to aspirate the knee joint from a suprapatellar lateral approach.  The medications used were 40 mg of Depo-Medrol and 1% lidocaine 3 cc  Anesthesia was provided by ethyl chloride and the skin was prepped with alcohol.  After cleaning the skin with alcohol an 18-gauge needle was used to aspirate the right knee joint.  We obtained 7  cc of fluid  We follow this by injection of 40 mg of Depo-Medrol and 3 cc 1% lidocaine.  There  were no complications. A sterile bandage was applied.

## 2014-02-05 ENCOUNTER — Ambulatory Visit (INDEPENDENT_AMBULATORY_CARE_PROVIDER_SITE_OTHER): Payer: Medicare Other | Admitting: Cardiology

## 2014-02-05 ENCOUNTER — Encounter: Payer: Self-pay | Admitting: Cardiology

## 2014-02-05 VITALS — BP 98/48 | HR 72 | Ht 73.0 in | Wt 313.0 lb

## 2014-02-05 DIAGNOSIS — I10 Essential (primary) hypertension: Secondary | ICD-10-CM

## 2014-02-05 DIAGNOSIS — I359 Nonrheumatic aortic valve disorder, unspecified: Secondary | ICD-10-CM

## 2014-02-05 NOTE — Progress Notes (Signed)
HPI The patient presents for followup of aortic valve replacement. He is considering having gastric surgery for weight loss and also might need back surgery.. Since I last saw him he has had no new cardiovascular complaints. However, he is limited by his back pain. He does have some mild shortness of breath with activity. He denies PND or orthopnea. He's not been noticing any palpitations, presyncope or syncope.  He's had no new edema.  Allergies  Allergen Reactions  . Phenothiazines Anaphylaxis  . Pioglitazone Swelling    Current Outpatient Prescriptions  Medication Sig Dispense Refill  . acarbose (PRECOSE) 100 MG tablet Take 1 tablet (100 mg total) by mouth 2 (two) times daily. 180 tablet 1  . aspirin 81 MG tablet Take 81 mg by mouth daily.      . calcium-vitamin D (OSCAL WITH D) 500-200 MG-UNIT per tablet Take 2 tablets by mouth 2 (two) times daily. 60 tablet 1  . citalopram (CELEXA) 20 MG tablet Take 1 tablet (20 mg total) by mouth daily. 30 tablet 1  . furosemide (LASIX) 40 MG tablet Take 1 tablet (40 mg total) by mouth daily. 30 tablet 4  . glyBURIDE (DIABETA) 5 MG tablet TAKE ONE TABLET BY MOUTH TWICE DAILY 180 tablet 0  . insulin detemir (LEVEMIR) 100 UNIT/ML injection 45 u BID 10 mL 5  . levothyroxine (SYNTHROID) 150 MCG tablet Take 1 tablet (150 mcg total) by mouth daily before breakfast. 30 tablet 5  . Linagliptin-Metformin HCl 2.05-998 MG TABS Take 1 tablet by mouth 2 (two) times daily. 60 tablet 5  . lisinopril (PRINIVIL,ZESTRIL) 20 MG tablet Take 1 tablet (20 mg total) by mouth daily. 30 tablet 4  . metoprolol tartrate (LOPRESSOR) 25 MG tablet Take 0.5 tablets (12.5 mg total) by mouth 2 (two) times daily. 60 tablet 4  . Multiple Vitamins-Minerals (MENS MULTI VITAMIN & MINERAL PO) Take 1 tablet by mouth daily.      . Omega-3 Fatty Acids (FISH OIL) 1200 MG CAPS Take 1-2 capsules by mouth 2 (two) times daily. 1 capsule by mouth q am, 2 capsules by mouth q pm    . simvastatin  (ZOCOR) 40 MG tablet Take 1 tablet (40 mg total) by mouth daily at 6 PM. 90 tablet 0  . glucose blood (BAYER CONTOUR NEXT TEST) test strip Test qid Dx E10.9 EZ strips 100 each 2   No current facility-administered medications for this visit.    Past Medical History  Diagnosis Date  . 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 BPH (benign prostatic hypertrophy) 04/15/2010  . DEPRESSION 01/22/2009  . HYPERCHOLESTEROLEMIA 01/22/2009  . 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 Morbid obesity   . Heart valve replaced   . Colon polyps   . Ulcer   . Heart murmur   . HYPERTENSION 01/22/2009    dr Casimer Lanius  . Arthritis   . Tremors of nervous system     ?ptsd    Past Surgical History  Procedure Laterality Date  . Appendectomy    . Aortic valve replacement  July 2006    #20 stentless [place] porcine valve  . Dental surgery  05/2004    Dental extractions  . Heel spur surgery      resection of heel spur  . Colonoscopy  04/24/2007    [name]: normal  . Tonsillectomy    . Bilateral cataract surg    . Colonoscopy with esophagogastroduodenoscopy (egd) N/A 04/05/2012    Procedure: COLONOSCOPY WITH ESOPHAGOGASTRODUODENOSCOPY (EGD);  Surgeon: Daneil Dolin, MD;  [facility];  Service: Endoscopy;  Laterality: N/A;  10;15  . Thyroidectomy  03/21/2013    DR [name]  . Thyroidectomy N/A 03/21/2013    Procedure: THYROIDECTOMY;  Surgeon: [name], MD;  Location: [place];  Service: General;  Laterality: N/A;   ROS:   As stated in the HPI and negative for all other systems.  PHYSICAL EXAM BP 98/48 mmHg  Pulse 72  Ht 6\' 1"  (1.854 m)  Wt 313 lb (141.976 kg)  BMI 41.30 kg/m2 GENERAL:  Well appearing NECK:  No jugular venous distention, waveform within normal limits, carotid upstroke brisk and symmetric, no bruits, no thyromegaly LUNGS:  Clear to auscultation bilaterally CHEST:  Well healed sternotomy scar. HEART:  PMI not displaced or sustained,S1 and S2 within normal limits, no S3, no S4 no clicks,  no rubs, systolic murmur mid peaking and  radiating into the carotids  ABD:  Flat, positive bowel sounds normal in frequency in pitch, no bruits, no rebound, no guarding, no midline pulsatile mass, no hepatomegaly, no splenomegaly, obese EXT:  2 plus pulses throughout, trace edema, no cyanosis no clubbing   EKG:  Sinus rhythm, rate 72,  right bundle branch block, LAFB, no acute ST T wave changes. The right bundle branch block is new since previous. 02/05/2014  ASSESSMENT AND PLAN  PREOP- The patient would be at low risk from an ischemia standpoint as he has no angina and he had no significant coronary disease on cath previously. I do not think further ischemia workup would be indicated. I would like to get the results of the echo prior to approving him for either back or stomach surgery.  AORTIC VALVE REPLACEMENT, HX OF - I will check an echocardiogram. No change in medications indicated at this point however.  HYPERTENSION -  His blood pressure was actually running low. He checks occasionally and says it's in the 120s. He understands that if he has any lower blood pressures we will and that reducing his medications.  CAROTID OCCLUSIVE DISEASE -  He has less than 50% plaque on the right and you will have a followup study in July.

## 2014-02-05 NOTE — Patient Instructions (Signed)
The current medical regimen is effective;  continue present plan and medications.  Your physician has requested that you have an echocardiogram. Echocardiography is a painless test that uses sound waves to create images of your heart. It provides your doctor with information about the size and shape of your heart and how well your heart's chambers and valves are working. This procedure takes approximately one hour. There are no restrictions for this procedure.  Follow up in 1 year with Dr. [name] in [place].  You will receive a letter in the mail 2 months before you are due.  Please call us when you receive this letter to schedule your follow up appointment.  Thank you for choosing [facility]!!

## 2014-02-06 ENCOUNTER — Telehealth: Payer: Self-pay | Admitting: Nurse Practitioner

## 2014-02-06 NOTE — Telephone Encounter (Signed)
Stp he was seen by Cardiologist yesterday and his BP was running on the lower side. In his notes it says if it gets lower may consider lowering the dosage of his meds. Pt has no complaints of dizziness or being lightheaded. Pt advised to CB if BP cont to be low or if he starts to experience the before mentioned symptoms. Pt voiced understanding.

## 2014-02-07 ENCOUNTER — Other Ambulatory Visit: Payer: Self-pay | Admitting: Nurse Practitioner

## 2014-02-08 ENCOUNTER — Other Ambulatory Visit: Payer: Self-pay | Admitting: Nurse Practitioner

## 2014-02-08 NOTE — Telephone Encounter (Signed)
rx called into pharmacy

## 2014-02-11 ENCOUNTER — Telehealth: Payer: Self-pay | Admitting: Family Medicine

## 2014-02-11 NOTE — Telephone Encounter (Signed)
Phone just kept ringing.  No answering machine to leave a message or alternate phone number

## 2014-02-12 ENCOUNTER — Telehealth: Payer: Self-pay | Admitting: Family Medicine

## 2014-02-12 ENCOUNTER — Telehealth: Payer: Self-pay | Admitting: Pharmacist

## 2014-02-12 MED ORDER — LINAGLIPTIN-METFORMIN HCL 2.5-1000 MG PO TABS: 1.0000 | ORAL_TABLET | Freq: Two times a day (BID) | ORAL | Status: DC

## 2014-02-12 NOTE — Telephone Encounter (Signed)
Please see notes for call from earlier this am

## 2014-02-12 NOTE — Telephone Encounter (Signed)
[facility] verified that patient has to meet deductible.  Therefore the first fill of Jentadueto will be $357.00.  They are not sure what copay will be for the rest of the year.  Patient is going to bring me his 2016 formulary so I can verify if there might be cheaper alternative or if maybe splitting Jantadueto into Tradjenta and metformin would be better option.  Patient states he has about 1 month supply so we have a little time to figure out best option for patient.

## 2014-02-12 NOTE — Telephone Encounter (Signed)
Called [facility] to get clarification on cost of Rx to patient.  They needed new rx to process - phone in.  They will call back with information about copay.

## 2014-02-17 ENCOUNTER — Telehealth: Payer: Self-pay | Admitting: Nurse Practitioner

## 2014-02-17 NOTE — Telephone Encounter (Signed)
Last week patient told me that he would bring by his 2016 drug formulary so that I could determine alternative for Jentadueto.  I called patient and reminded him to do this.  He states he will bring by office tomorrow.  I also confirmed that he has about 3 weeks of Jentadueto currently.

## 2014-02-17 NOTE — Telephone Encounter (Signed)
Patient aware we do not have any samples and he states that he can not afford the jentavueto. Please advise

## 2014-02-21 ENCOUNTER — Other Ambulatory Visit: Payer: Self-pay | Admitting: Nurse Practitioner

## 2014-02-25 DIAGNOSIS — M5126 Other intervertebral disc displacement, lumbar region: Secondary | ICD-10-CM | POA: Diagnosis not present

## 2014-02-26 ENCOUNTER — Other Ambulatory Visit: Payer: Self-pay | Admitting: Nurse Practitioner

## 2014-02-26 MED ORDER — GLIPIZIDE 5 MG PO TABS: 5.0000 mg | ORAL_TABLET | Freq: Two times a day (BID) | ORAL | Status: DC

## 2014-02-27 ENCOUNTER — Telehealth: Payer: Self-pay | Admitting: Family Medicine

## 2014-02-27 ENCOUNTER — Telehealth: Payer: Self-pay | Admitting: Orthopedic Surgery

## 2014-02-27 NOTE — Telephone Encounter (Signed)
Please see notes from previous call.

## 2014-02-27 NOTE — Telephone Encounter (Signed)
Patient must meet deductible for 2016 and Jentodueto would cost $300 this month.  He also is concern that it would be about $100 per month once deductible met because it is tier 3.  All alternatives to [facility] / Tradjenta are tier 3. Jentadueto discontinued and patient to start metformin 1059m 1 tabelt BID with food.  Patient states that he has [facility].  Patient is advised to call office if BG begins to rise and is consistently over 150 fasting.  Might need to adjust insulin if future.

## 2014-02-27 NOTE — Telephone Encounter (Signed)
Patient called to relay that he had been seen 02/04/14 for knee aspiration, and states that he now is having knee stiffness and swelling.  His ph# is [phone]; please advise.

## 2014-03-03 DIAGNOSIS — N4883 Acquired buried penis: Secondary | ICD-10-CM | POA: Diagnosis not present

## 2014-03-03 DIAGNOSIS — R490 Dysphonia: Secondary | ICD-10-CM | POA: Diagnosis not present

## 2014-03-03 DIAGNOSIS — N3941 Urge incontinence: Secondary | ICD-10-CM | POA: Diagnosis not present

## 2014-03-03 DIAGNOSIS — N471 Phimosis: Secondary | ICD-10-CM | POA: Diagnosis not present

## 2014-03-03 NOTE — Telephone Encounter (Signed)
As of 02/27/14, patient has been scheduled for appointment (03/06/14); aware.

## 2014-03-04 ENCOUNTER — Encounter: Payer: Self-pay | Admitting: Orthopedic Surgery

## 2014-03-04 ENCOUNTER — Ambulatory Visit (INDEPENDENT_AMBULATORY_CARE_PROVIDER_SITE_OTHER): Payer: Medicare Other | Admitting: Orthopedic Surgery

## 2014-03-04 VITALS — BP 115/50 | Ht 73.0 in | Wt 313.0 lb

## 2014-03-04 DIAGNOSIS — M25461 Effusion, right knee: Secondary | ICD-10-CM

## 2014-03-04 DIAGNOSIS — S83241D Other tear of medial meniscus, current injury, right knee, subsequent encounter: Secondary | ICD-10-CM

## 2014-03-04 NOTE — Progress Notes (Signed)
Patient ID: [name], male   DOB: 10/15/1945, 69 y.o.   MRN: [id number] Chief Complaint  Patient presents with  . Follow-up    Recheck right knee swelling, last seen on 02-04-14.    Prior history 69 year old male [facility] 4 weeks ago complains of 6 out of 10 throbbing pain with stiffness. No previous treatment. X-rays negative for fracture.   Previous treatments  I aspirated the nail knee got back about 7 mL seemed like there is more in their than that may be loculated  Recommend Ace wrap injection follow-up with me if no improvement.  Procedure note injection and aspiration right knee joint  The patient presents back with no improvement in his overall symptoms. After that fall we did aspirate his knee would put him on medication but now he is complaining of repeat swelling pain loss of motion catching locking and giving way  System review no changes but specifically no numbness or tingling in the leg he does have some pain radiating down into his foot no vascular symptoms of claudication and no history of fever  BP 115/50 mmHg  Ht 6\' 1"  (1.854 m)  Wt 313 lb (141.976 kg)  BMI 41.30 kg/m2 His overall grooming hygiene appearance is normal is oriented 3 his mood is pleasant his affect is neutral his ambulatory pattern shows a slight limp  He has tenderness over the medial joint line with large effusion is knee flexion is limited to 110 flexion arc. In the coronal and sagittal plane the ligaments are stable he has good extension power motor grade 5 skin is intact without rash pulses are normal and there is normal sensation in the leg  Previous films did show some arthritic changes in the knee nothing acute  Impression.dx  Encounter Diagnoses  Name Primary?  . Torn medial meniscus, right, subsequent encounter Yes  . Effusion of knee joint, right      I did repeat his aspiration injection  Procedure note  injection and aspiration right knee joint  Verbal consent was obtained to aspirate and inject the right knee joint   Timeout was completed to confirm the site of aspiration and injection  An 18-gauge needle was used to aspirate the knee joint from a suprapatellar lateral approach.  The medications used were 40 mg of Depo-Medrol and 1% lidocaine 3 cc  Anesthesia was provided by ethyl chloride and the skin was prepped with alcohol.  After cleaning the skin with alcohol an 18-gauge needle was used to aspirate the right knee joint.  We obtained 30  cc of fluid  We follow this by injection of 40 mg of Depo-Medrol and 3 cc 1% lidocaine.  There were no complications. A sterile bandage was applied.  We will have to do some surgery on this knee. At first though I think we should scan the knee with MRI and then we can proceed based on that

## 2014-03-04 NOTE — Patient Instructions (Addendum)
We will schedule MRI for you and call you with results 

## 2014-03-04 NOTE — Progress Notes (Signed)
Patient aware.

## 2014-03-05 DIAGNOSIS — M47817 Spondylosis without myelopathy or radiculopathy, lumbosacral region: Secondary | ICD-10-CM | POA: Diagnosis not present

## 2014-03-05 DIAGNOSIS — M5127 Other intervertebral disc displacement, lumbosacral region: Secondary | ICD-10-CM | POA: Diagnosis not present

## 2014-03-06 ENCOUNTER — Other Ambulatory Visit (INDEPENDENT_AMBULATORY_CARE_PROVIDER_SITE_OTHER): Payer: Medicare Other

## 2014-03-06 DIAGNOSIS — I359 Nonrheumatic aortic valve disorder, unspecified: Secondary | ICD-10-CM

## 2014-03-06 DIAGNOSIS — I10 Essential (primary) hypertension: Secondary | ICD-10-CM

## 2014-03-07 ENCOUNTER — Telehealth: Payer: Self-pay | Admitting: Nurse Practitioner

## 2014-03-07 ENCOUNTER — Telehealth: Payer: Self-pay | Admitting: *Deleted

## 2014-03-07 MED ORDER — SITAGLIPTIN PHOSPHATE 100 MG PO TABS: 100.0000 mg | ORAL_TABLET | Freq: Every day | ORAL | Status: DC

## 2014-03-07 MED ORDER — METFORMIN HCL 1000 MG PO TABS: 1000.0000 mg | ORAL_TABLET | Freq: Two times a day (BID) | ORAL | Status: DC

## 2014-03-07 NOTE — Telephone Encounter (Signed)
   Patient states his knee is still hurting after aspiration/injection 3 days ago. States he is thinking of going to ER. Advised patient rest, Tylenol, and ice. Patient has MRI scheduled for 03/14/14 and will advise of further treatment plan once results are received. Advised ER only if pain is SEVERE.

## 2014-03-07 NOTE — Telephone Encounter (Signed)
Chart says glipizide noy gylburide?

## 2014-03-07 NOTE — Telephone Encounter (Signed)
Patient aware medications sent to pharmacy and that he is suppose to be on glipizide

## 2014-03-11 ENCOUNTER — Telehealth: Payer: Self-pay | Admitting: Nurse Practitioner

## 2014-03-12 ENCOUNTER — Encounter: Payer: Self-pay | Admitting: Family Medicine

## 2014-03-12 ENCOUNTER — Ambulatory Visit: Payer: Medicare Other | Admitting: Family Medicine

## 2014-03-12 ENCOUNTER — Ambulatory Visit (INDEPENDENT_AMBULATORY_CARE_PROVIDER_SITE_OTHER): Payer: Medicare Other | Admitting: Family Medicine

## 2014-03-12 VITALS — BP 110/63 | HR 82 | Temp 98.1°F | Ht 72.5 in | Wt 314.0 lb

## 2014-03-12 DIAGNOSIS — E1140 Type 2 diabetes mellitus with diabetic neuropathy, unspecified: Secondary | ICD-10-CM | POA: Insufficient documentation

## 2014-03-12 DIAGNOSIS — E785 Hyperlipidemia, unspecified: Secondary | ICD-10-CM | POA: Diagnosis not present

## 2014-03-12 DIAGNOSIS — E119 Type 2 diabetes mellitus without complications: Secondary | ICD-10-CM

## 2014-03-12 DIAGNOSIS — E890 Postprocedural hypothyroidism: Secondary | ICD-10-CM | POA: Diagnosis not present

## 2014-03-12 LAB — POCT GLYCOSYLATED HEMOGLOBIN (HGB A1C): Hemoglobin A1C: 7.1

## 2014-03-12 NOTE — Patient Instructions (Signed)
Diabetes and Exercise Exercising regularly is important. It is not just about losing weight. It has many health benefits, such as:  Improving your overall fitness, flexibility, and endurance.  Increasing your bone density.  Helping with weight control.  Decreasing your body fat.  Increasing your muscle strength.  Reducing stress and tension.  Improving your overall health. People with diabetes who exercise gain additional benefits because exercise:  Reduces appetite.  Improves the body's use of blood sugar (glucose).  Helps lower or control blood glucose.  Decreases blood pressure.  Helps control blood lipids (such as cholesterol and triglycerides).  Improves the body's use of the hormone insulin by:  Increasing the body's insulin sensitivity.  Reducing the body's insulin needs.  Decreases the risk for heart disease because exercising:  Lowers cholesterol and triglycerides levels.  Increases the levels of good cholesterol (such as high-density lipoproteins [HDL]) in the body.  Lowers blood glucose levels. YOUR ACTIVITY PLAN  Choose an activity that you enjoy and set realistic goals. Your health care provider or diabetes educator can help you make an activity plan that works for you. Exercise regularly as directed by your health care provider. This includes:  Performing resistance training twice a week such as push-ups, sit-ups, lifting weights, or using resistance bands.  Performing 150 minutes of cardio exercises each week such as walking, running, or playing sports.  Staying active and spending no more than 90 minutes at one time being inactive. Even short bursts of exercise are good for you. Three 10-minute sessions spread throughout the day are just as beneficial as a single 30-minute session. Some exercise ideas include:  Taking the dog for a walk.  Taking the stairs instead of the elevator.  Dancing to your favorite song.  Doing an exercise  video.  Doing your favorite exercise with a friend. RECOMMENDATIONS FOR EXERCISING WITH TYPE 1 OR TYPE 2 DIABETES   Check your blood glucose before exercising. If blood glucose levels are greater than 240 mg/dL, check for urine ketones. Do not exercise if ketones are present.  Avoid injecting insulin into areas of the body that are going to be exercised. For example, avoid injecting insulin into:  The arms when playing tennis.  The legs when jogging.  Keep a record of:  Food intake before and after you exercise.  Expected peak times of insulin action.  Blood glucose levels before and after you exercise.  The type and amount of exercise you have done.  Review your records with your health care provider. Your health care provider will help you to develop guidelines for adjusting food intake and insulin amounts before and after exercising.  If you take insulin or oral hypoglycemic agents, watch for signs and symptoms of hypoglycemia. They include:  Dizziness.  Shaking.  Sweating.  Chills.  Confusion.  Drink plenty of water while you exercise to prevent dehydration or heat stroke. Body water is lost during exercise and must be replaced.  Talk to your health care provider before starting an exercise program to make sure it is safe for you. Remember, almost any type of activity is better than none. Document Released: 04/02/2003 Document Revised: 05/27/2013 Document Reviewed: 06/19/2012 [facility] Patient Information 2015 [place], [place]. This information is not intended to replace advice given to you by your health care provider. Make sure you discuss any questions you have with your health care provider. Diabetes and Foot Care Diabetes may cause you to have problems because of poor blood supply (circulation) to your feet and  legs. This may cause the skin on your feet to become thinner, break easier, and heal more slowly. Your skin may become dry, and the skin may peel and crack.  You may also have nerve damage in your legs and feet causing decreased feeling in them. You may not notice minor injuries to your feet that could lead to infections or more serious problems. Taking care of your feet is one of the most important things you can do for yourself.  HOME CARE INSTRUCTIONS  Wear shoes at all times, even in the house. Do not go barefoot. Bare feet are easily injured.  Check your feet daily for blisters, cuts, and redness. If you cannot see the bottom of your feet, use a mirror or ask someone for help.  Wash your feet with warm water (do not use hot water) and mild soap. Then pat your feet and the areas between your toes until they are completely dry. Do not soak your feet as this can dry your skin.  Apply a moisturizing lotion or petroleum jelly (that does not contain alcohol and is unscented) to the skin on your feet and to dry, brittle toenails. Do not apply lotion between your toes.  Trim your toenails straight across. Do not dig under them or around the cuticle. File the edges of your nails with an emery board or nail file.  Do not cut corns or calluses or try to remove them with medicine.  Wear clean socks or stockings every day. Make sure they are not too tight. Do not wear knee-high stockings since they may decrease blood flow to your legs.  Wear shoes that fit properly and have enough cushioning. To break in new shoes, wear them for just a few hours a day. This prevents you from injuring your feet. Always look in your shoes before you put them on to be sure there are no objects inside.  Do not cross your legs. This may decrease the blood flow to your feet.  If you find a minor scrape, cut, or break in the skin on your feet, keep it and the skin around it clean and dry. These areas may be cleansed with mild soap and water. Do not cleanse the area with peroxide, alcohol, or iodine.  When you remove an adhesive bandage, be sure not to damage the skin around  it.  If you have a wound, look at it several times a day to make sure it is healing.  Do not use heating pads or hot water bottles. They may burn your skin. If you have lost feeling in your feet or legs, you may not know it is happening until it is too late.  Make sure your health care provider performs a complete foot exam at least annually or more often if you have foot problems. Report any cuts, sores, or bruises to your health care provider immediately. SEEK MEDICAL CARE IF:   You have an injury that is not healing.  You have cuts or breaks in the skin.  You have an ingrown nail.  You notice redness on your legs or feet.  You feel burning or tingling in your legs or feet.  You have pain or cramps in your legs and feet.  Your legs or feet are numb.  Your feet always feel cold. SEEK IMMEDIATE MEDICAL CARE IF:   There is increasing redness, swelling, or pain in or around a wound.  There is a red line that goes up your leg.  Pus is coming from a wound.  You develop a fever or as directed by your health care provider.  You notice a bad smell coming from an ulcer or wound. Document Released: 01/08/2000 Document Revised: 09/12/2012 Document Reviewed: 06/19/2012 [facility] Patient Information 2015 [place], [place]. This information is not intended to replace advice given to you by your health care provider. Make sure you discuss any questions you have with your health care provider. Basic Carbohydrate Counting for Diabetes Mellitus Carbohydrate counting is a method for keeping track of the amount of carbohydrates you eat. Eating carbohydrates naturally increases the level of sugar (glucose) in your blood, so it is important for you to know the amount that is okay for you to have in every meal. Carbohydrate counting helps keep the level of glucose in your blood within normal limits. The amount of carbohydrates allowed is different for every person. A dietitian can help you calculate the  amount that is right for you. Once you know the amount of carbohydrates you can have, you can count the carbohydrates in the foods you want to eat. Carbohydrates are found in the following foods:  Grains, such as breads and cereals.  Dried beans and soy products.  Starchy vegetables, such as potatoes, peas, and corn.  Fruit and fruit juices.  Milk and yogurt.  Sweets and snack foods, such as cake, cookies, candy, chips, soft drinks, and fruit drinks. CARBOHYDRATE COUNTING There are two ways to count the carbohydrates in your food. You can use either of the methods or a combination of both. Reading the "Nutrition Facts" on [place] The "Nutrition Facts" is an area that is included on the labels of almost all packaged food and beverages in the [place]. It includes the serving size of that food or beverage and information about the nutrients in each serving of the food, including the grams (g) of carbohydrate per serving.  Decide the number of servings of this food or beverage that you will be able to eat or drink. Multiply that number of servings by the number of grams of carbohydrate that is listed on the label for that serving. The total will be the amount of carbohydrates you will be having when you eat or drink this food or beverage. Learning Standard Serving Sizes of Food When you eat food that is not packaged or does not include "Nutrition Facts" on the label, you need to measure the servings in order to count the amount of carbohydrates.A serving of most carbohydrate-rich foods contains about 15 g of carbohydrates. The following list includes serving sizes of carbohydrate-rich foods that provide 15 g ofcarbohydrate per serving:   1 slice of bread (1 oz) or 1 six-inch tortilla.    of a hamburger bun or English muffin.  4-6 crackers.   cup unsweetened dry cereal.    cup hot cereal.   cup rice or pasta.    cup mashed potatoes or  of a large baked potato.  1  cup fresh fruit or one small piece of fruit.    cup canned or frozen fruit or fruit juice.  1 cup milk.   cup plain fat-free yogurt or yogurt sweetened with artificial sweeteners.   cup cooked dried beans or starchy vegetable, such as peas, corn, or potatoes.  Decide the number of standard-size servings that you will eat. Multiply that number of servings by 15 (the grams of carbohydrates in that serving). For example, if you eat 2 cups of strawberries, you will have eaten  2 servings and 30 g of carbohydrates (2 servings x 15 g = 30 g). For foods such as soups and casseroles, in which more than one food is mixed in, you will need to count the carbohydrates in each food that is included. EXAMPLE OF CARBOHYDRATE COUNTING Sample Dinner  3 oz chicken breast.   cup of brown rice.   cup of corn.  1 cup milk.   1 cup strawberries with sugar-free whipped topping.  Carbohydrate Calculation Step 1: Identify the foods that contain carbohydrates:   Rice.   Corn.   Milk.   Strawberries. Step 2:Calculate the number of servings eaten of each:   2 servings of rice.   1 serving of corn.   1 serving of milk.   1 serving of strawberries. Step 3: Multiply each of those number of servings by 15 g:   2 servings of rice x 15 g = 30 g.   1 serving of corn x 15 g = 15 g.   1 serving of milk x 15 g = 15 g.   1 serving of strawberries x 15 g = 15 g. Step 4: Add together all of the amounts to find the total grams of carbohydrates eaten: 30 g + 15 g + 15 g + 15 g = 75 g. Document Released: 01/10/2005 Document Revised: 05/27/2013 Document Reviewed: 12/07/2012 [employer] Patient Information 2015 [employer]. This information is not intended to replace advice given to you by your health care provider. Make sure you discuss any questions you have with your health care provider.  

## 2014-03-12 NOTE — Progress Notes (Signed)
Subjective:  Patient ID: [name], male    DOB: May 09, 1945  Age: 69 y.o. MRN: [id number]  CC: Diabetes; Hypertension; and Hyperlipidemia   HPI [name] presents for Patient in for follow-up of hypertension. Patient has no history of headache chest pain or shortness of breath or recent cough. Patient also denies symptoms of TIA such as numbness weakness lateralizing. Patient checks  blood pressure at home and has not had any elevated readings recently. Patient denies side effects from his medication. States taking it regularly. Patient in for follow-up of elevated cholesterol. Doing well without complaints on current medication. Denies side effects of statin including myalgia and arthralgia and nausea. Also in today for liver function testing. Currently no chest pain, shortness of breath or other cardiovascular related symptoms noted. Patient does not check blood sugar at home Patient denies symptoms such as polyuria, polydipsia, excessive hunger, nausea No significant hypoglycemic spells noted. Medications as noted below. Taking them regularly without complication/adverse reaction being reported today.   Patient presents for follow-up on  thyroid. She has a history of hypothyroidism for many years. It has been stable recently. Pt. denies any change in  voice, loss of hair, heat or cold intolerance. Energy level has been adequate to good. She denies constipation and diarrhea. No myxedema. Medication is as noted below. Verified that pt is taking it daily on an empty stomach. Well tolerated.  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   His family history includes Diabetes in an other family member.He reports that he quit smoking about 52 years ago. He has never used smokeless tobacco. He reports that he does not drink alcohol or use illicit drugs.  Current Outpatient Prescriptions on File Prior to Visit  Medication Sig Dispense Refill  . acarbose (PRECOSE) 100 MG tablet Take 1 tablet (100 mg total) by mouth 2 (two) times daily. 180 tablet 1  . aspirin 81 MG tablet Take 81 mg by mouth daily.      . calcium-vitamin D (OSCAL WITH D) 500-200 MG-UNIT per tablet Take 2 tablets by mouth 2 (two) times daily. 60 tablet 1  . citalopram (CELEXA) 20 MG tablet Take 1 tablet (20 mg total) by mouth daily. 30 tablet 1  . furosemide (LASIX) 40 MG tablet Take 1 tablet (40 mg total) by mouth daily. 30 tablet 4  . glipiZIDE (GLUCOTROL) 5 MG tablet Take 1 tablet (5 mg total) by mouth 2 (two) times daily before a meal. 180 tablet 1  . glucose blood (BAYER CONTOUR NEXT TEST) test strip Test qid Dx E10.9 EZ strips 100 each 2  . insulin detemir (LEVEMIR) 100 UNIT/ML injection 45 u BID 10 mL 5  . lisinopril (PRINIVIL,ZESTRIL) 20 MG tablet Take 1 tablet (20 mg total) by mouth daily. (Patient taking differently: Take 20 mg by mouth daily. Take 1/2 tablet daily) 30 tablet 4  . metFORMIN (GLUCOPHAGE) 1000 MG tablet Take 1 tablet (1,000 mg total) by mouth 2 (two) times daily with a meal. 180 tablet 3  . metoprolol tartrate (LOPRESSOR) 25 MG tablet Take 0.5  tablets (12.5 mg total) by mouth 2 (two) times daily. 60 tablet 4  . Multiple Vitamins-Minerals (MENS MULTI VITAMIN & MINERAL PO) Take 1 tablet by mouth daily.      . Omega-3 Fatty Acids (FISH OIL) 1200 MG  CAPS Take 1-2 capsules by mouth 2 (two) times daily. 1 capsule by mouth q am, 2 capsules by mouth q pm    . simvastatin (ZOCOR) 40 MG tablet Take 1 tablet (40 mg total) by mouth daily at 6 PM. 90 tablet 0  . sitaGLIPtin (JANUVIA) 100 MG tablet Take 1 tablet (100 mg total) by mouth daily. 30 tablet 3   No current facility-administered medications on file prior to visit.    ROS Review of Systems  Constitutional: Negative for fever, chills, diaphoresis and unexpected weight change.  HENT: Negative for congestion, hearing loss, rhinorrhea, sore throat and trouble swallowing.   Respiratory: Negative for cough, chest tightness, shortness of breath and wheezing.   Gastrointestinal: Negative for nausea, vomiting, abdominal pain, diarrhea, constipation and abdominal distention.  Endocrine: Negative for cold intolerance and heat intolerance.  Genitourinary: Negative for dysuria, hematuria and flank pain.  Musculoskeletal: Negative for joint swelling and arthralgias.  Skin: Negative for rash.  Neurological: Negative for dizziness and headaches.  Psychiatric/Behavioral: Negative for dysphoric mood, decreased concentration and agitation. The patient is not nervous/anxious.     Objective:  BP 110/63 mmHg  Pulse 82  Temp(Src) 98.1 F (36.7 C) (Oral)  Ht 6' 0.5" (1.842 m)  Wt 314 lb (142.429 kg)  BMI 41.98 kg/m2  Physical Exam  Constitutional: He is oriented to person, place, and time. He appears well-developed and well-nourished. No distress.  HENT:  Head: Normocephalic and atraumatic.  Right Ear: External ear normal.  Left Ear: External ear normal.  Nose: Nose normal.  Mouth/Throat: Oropharynx is clear and moist.  Eyes: Conjunctivae and EOM are normal. Pupils are equal, round, and reactive to light.  Neck: Normal range of motion. Neck supple. No thyromegaly present.  Cardiovascular: Normal rate, regular rhythm and normal heart sounds.   No murmur heard. Pulmonary/Chest: Effort normal and  breath sounds normal. No respiratory distress. He has no wheezes. He has no rales.  Abdominal: Soft. Bowel sounds are normal. He exhibits no distension. There is no tenderness.  Lymphadenopathy:    He has no cervical adenopathy.  Neurological: He is alert and oriented to person, place, and time. He has normal reflexes.  Skin: Skin is warm and dry.  Psychiatric: He has a normal mood and affect. His behavior is normal. Judgment and thought content normal.    Assessment & Plan:   Dontee was seen today for diabetes, hypertension and hyperlipidemia.  Diagnoses and all orders for this visit:  DM type 2, not at goal Orders: -     POCT glycosylated hemoglobin (Hb A1C) -     CMP14+EGFR  Postsurgical hypothyroidism Orders: -     TSH -     T3, Free -     T4, Free -     CMP14+EGFR  Hyperlipidemia with target LDL less than 100 Orders: -     CMP14+EGFR   I have discontinued Mr. Lynk levothyroxine. I am also having him maintain his Fish Oil, aspirin, Multiple Vitamins-Minerals (MENS MULTI VITAMIN & MINERAL PO), calcium-vitamin D, citalopram, simvastatin, metoprolol tartrate, insulin detemir, acarbose, lisinopril, glucose blood, furosemide, glipiZIDE, metFORMIN, and sitaGLIPtin.  No orders of the defined types were placed in this encounter.  Follow-up: Return in about 3 months (around 06/10/2014) for  diabetes, cholesterol.  Claretta Fraise, M.D.

## 2014-03-12 NOTE — Telephone Encounter (Signed)
[name], his Januvia went up to $362. [name] asked me to send this to you to see what you wanted her to do, MMM isnt here. He has been out for 3 days

## 2014-03-12 NOTE — Telephone Encounter (Signed)
[name], PHARMD at 02/27/2014 3:19 PM     Status: Signed       Expand All Collapse All   Patient must meet deductible for 2016 and Jentodueto would cost $300 this month. He also is concern that it would be about $100 per month once deductible met because it is tier 3. All alternatives to [facility] / Tradjenta are tier 3. Jentadueto discontinued and patient to start metformin 1058m 1 tabelt BID with food. Patient states that he has [facility]  Patient is advised to call office if BG begins to rise and is consistently over 150 fasting. Might need to adjust insulin if future.        Above are notes from phone conversation I had with patient 02/27/2014.  I called again and discuseed with patient that there is no generic alternative to [place] Jentadueto.  All similar therapeutic agents are tier 3 on his insurance.  He is not start just metfomin 10064m1 tablet bid with food.  Continue to monitor BG - is he starts to notice BG readings over 150 he is to contact me at office for insulin adjustment.  This is the least expensive way I can find for his diabetes medications at this time.

## 2014-03-13 LAB — CMP14+EGFR
A/G RATIO: 1.8 (ref 1.1–2.5)
ALT: 15 IU/L (ref 0–44)
AST: 16 IU/L (ref 0–40)
Albumin: 4.1 g/dL (ref 3.5–4.8)
Alkaline Phosphatase: 48 IU/L (ref 39–117)
BUN/Creatinine Ratio: 20 (ref 10–22)
BUN: 28 mg/dL — ABNORMAL HIGH (ref 8–27)
Bilirubin Total: 0.3 mg/dL (ref 0.0–1.2)
CO2: 26 mmol/L (ref 18–29)
CREATININE: 1.4 mg/dL — AB (ref 0.76–1.27)
Calcium: 9.4 mg/dL (ref 8.6–10.2)
Chloride: 98 mmol/L (ref 97–108)
GFR calc Af Amer: 58 mL/min/{1.73_m2} — ABNORMAL LOW (ref >59)
GFR calc non Af Amer: 51 mL/min/{1.73_m2} — ABNORMAL LOW (ref >59)
GLOBULIN, TOTAL: 2.3 g/dL (ref 1.5–4.5)
Glucose: 135 mg/dL — ABNORMAL HIGH (ref 65–99)
Potassium: 5.5 mmol/L — ABNORMAL HIGH (ref 3.5–5.2)
SODIUM: 138 mmol/L (ref 134–144)
Total Protein: 6.4 g/dL (ref 6.0–8.5)

## 2014-03-13 LAB — T4, FREE: FREE T4: 1.27 ng/dL (ref 0.82–1.77)

## 2014-03-13 LAB — TSH: TSH: 14.42 u[IU]/mL — ABNORMAL HIGH (ref 0.450–4.500)

## 2014-03-13 LAB — T3, FREE: T3 FREE: 2 pg/mL (ref 2.0–4.4)

## 2014-03-14 ENCOUNTER — Other Ambulatory Visit: Payer: Self-pay | Admitting: Family Medicine

## 2014-03-14 ENCOUNTER — Ambulatory Visit (HOSPITAL_COMMUNITY)
Admission: RE | Admit: 2014-03-14 | Discharge: 2014-03-14 | Disposition: A | Discharge status: Home / Self Care | Payer: Medicare Other | Source: Ambulatory Visit | Attending: Orthopedic Surgery | Admitting: Orthopedic Surgery

## 2014-03-14 ENCOUNTER — Telehealth: Payer: Self-pay | Admitting: Family Medicine

## 2014-03-14 DIAGNOSIS — Y9389 Activity, other specified: Secondary | ICD-10-CM | POA: Insufficient documentation

## 2014-03-14 DIAGNOSIS — R200 Anesthesia of skin: Secondary | ICD-10-CM | POA: Diagnosis not present

## 2014-03-14 DIAGNOSIS — S83281A Other tear of lateral meniscus, current injury, right knee, initial encounter: Secondary | ICD-10-CM | POA: Insufficient documentation

## 2014-03-14 DIAGNOSIS — R531 Weakness: Secondary | ICD-10-CM | POA: Diagnosis not present

## 2014-03-14 DIAGNOSIS — S83241D Other tear of medial meniscus, current injury, right knee, subsequent encounter: Secondary | ICD-10-CM

## 2014-03-14 DIAGNOSIS — M25561 Pain in right knee: Secondary | ICD-10-CM | POA: Diagnosis not present

## 2014-03-14 MED ORDER — LEVOTHYROXINE SODIUM 200 MCG PO TABS: 200.0000 ug | ORAL_TABLET | Freq: Every day | ORAL | Status: DC

## 2014-03-14 NOTE — Telephone Encounter (Signed)
Patient called.  He wanted to clarify when to take glipizide - advised to take glipizide 5mg  before breakfast and before supper.

## 2014-03-17 NOTE — Progress Notes (Signed)
Patient aware.

## 2014-03-17 NOTE — Progress Notes (Signed)
i tried to call him   See if anyone can get him to come in for a preop visit weds am  He has a torn meniscus (cartilage) and needs surgery

## 2014-03-19 ENCOUNTER — Encounter: Payer: Self-pay | Admitting: Orthopedic Surgery

## 2014-03-19 ENCOUNTER — Ambulatory Visit (INDEPENDENT_AMBULATORY_CARE_PROVIDER_SITE_OTHER): Payer: Medicare Other | Admitting: Orthopedic Surgery

## 2014-03-19 ENCOUNTER — Telehealth: Payer: Self-pay | Admitting: Family Medicine

## 2014-03-19 ENCOUNTER — Other Ambulatory Visit: Payer: Self-pay | Admitting: *Deleted

## 2014-03-19 VITALS — BP 88/55 | Ht 73.0 in | Wt 313.0 lb

## 2014-03-19 DIAGNOSIS — S83281D Other tear of lateral meniscus, current injury, right knee, subsequent encounter: Secondary | ICD-10-CM | POA: Diagnosis not present

## 2014-03-19 DIAGNOSIS — M1711 Unilateral primary osteoarthritis, right knee: Secondary | ICD-10-CM | POA: Diagnosis not present

## 2014-03-19 NOTE — Progress Notes (Signed)
Preop evaluation  Patient ID: [name], male   DOB: 11-17-1945, 69 y.o.   MRN: [id number]  Chief Complaint  Patient presents with  . Follow-up    Recheck on right knee and discuss surgery.    HPI [name] is a 69 y.o. male.  Who presents for preop evaluation regarding his right knee. The patient was treated and evaluated for pain in his right knee thought to have meniscal tear his MRI shows severe arthritis of the medial compartment and a torn lateral meniscus with displaced fragment. He has an aortic pig valve and we will discuss this with his cardiologist regarding preop antibiotics but Ancef his usually the course. His pain is somewhat diffuse but can be localized to the lateral and medial joint lines and has a dull throbbing quality with occasional stabbing pain of moderate severity. It is worse with activities and is somewhat better with rest but not completely relieved   HPI  Past Medical History  Diagnosis Date  . 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 BPH (benign prostatic hypertrophy) 04/15/2010  . DEPRESSION 01/22/2009  . HYPERCHOLESTEROLEMIA 01/22/2009  . 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 Morbid obesity   . Heart valve replaced   . Colon polyps   . Ulcer   . Heart murmur   . HYPERTENSION 01/22/2009    dr Casimer Lanius  . Arthritis   . Tremors of nervous system     ?ptsd    Past Surgical History  Procedure Laterality Date  . Appendectomy    . Aortic valve replacement  July 2006    #20 stentless [place] porcine valve  . Dental surgery  05/2004    Dental extractions  . Heel spur surgery      resection of heel spur  . Colonoscopy  04/24/2007    [name]: normal  . Tonsillectomy    . Bilateral cataract surg    . Colonoscopy with esophagogastroduodenoscopy (egd) N/A 04/05/2012    Procedure: COLONOSCOPY WITH ESOPHAGOGASTRODUODENOSCOPY (EGD);  Surgeon: Daneil Dolin, MD;  Location: [facility];  Service: Endoscopy;  Laterality: N/A;  10;15  . Thyroidectomy   03/21/2013    DR [name]  . Thyroidectomy N/A 03/21/2013    Procedure: THYROIDECTOMY;  Surgeon: [name], MD;  Location: [facility] OR;  Service: General;  Laterality: N/A;    Family History  Problem Relation Age of Onset  . Diabetes      Social History History  Substance Use Topics  . Smoking status: Former Smoker    Quit date: 09/20/1961  . Smokeless tobacco: Never Used  . Alcohol Use: No    Allergies  Allergen Reactions  . Phenothiazines Anaphylaxis  . Pioglitazone Swelling    Current Outpatient Prescriptions  Medication Sig Dispense Refill  . acarbose (PRECOSE) 100 MG tablet Take 1 tablet (100 mg total) by mouth 2 (two) times daily. 180 tablet 1  . aspirin 81 MG tablet Take 81 mg by mouth daily.      . calcium-vitamin D (OSCAL WITH D) 500-200 MG-UNIT per tablet Take 2 tablets by mouth 2 (two) times daily. 60 tablet 1  . citalopram (CELEXA) 20 MG tablet Take 1 tablet (20 mg total) by mouth daily. 30 tablet 1  . furosemide (LASIX) 40 MG tablet Take 1 tablet (40 mg total) by mouth daily. 30 tablet 4  . glipiZIDE (GLUCOTROL) 5 MG tablet Take 1 tablet (5 mg total) by mouth 2 (two) times daily before a meal. 180 tablet 1  . glucose blood (BAYER CONTOUR  NEXT TEST) test strip Test qid Dx E10.9 EZ strips 100 each 2  . insulin detemir (LEVEMIR) 100 UNIT/ML injection 45 u BID 10 mL 5  . levothyroxine (SYNTHROID, LEVOTHROID) 200 MCG tablet Take 1 tablet (200 mcg total) by mouth daily before breakfast. 30 tablet 2  . lisinopril (PRINIVIL,ZESTRIL) 20 MG tablet Take 1 tablet (20 mg total) by mouth daily. (Patient taking differently: Take 20 mg by mouth daily. Take 1/2 tablet daily) 30 tablet 4  . metFORMIN (GLUCOPHAGE) 1000 MG tablet Take 1 tablet (1,000 mg total) by mouth 2 (two) times daily with a meal. 180 tablet 3  . metoprolol tartrate (LOPRESSOR) 25 MG tablet Take 0.5 tablets (12.5 mg total) by mouth 2 (two) times daily. 60 tablet 4  . Multiple Vitamins-Minerals (MENS MULTI VITAMIN &  MINERAL PO) Take 1 tablet by mouth daily.      . Omega-3 Fatty Acids (FISH OIL) 1200 MG CAPS Take 1-2 capsules by mouth 2 (two) times daily. 1 capsule by mouth q am, 2 capsules by mouth q pm    . simvastatin (ZOCOR) 40 MG tablet Take 1 tablet (40 mg total) by mouth daily at 6 PM. 90 tablet 0  . sitaGLIPtin (JANUVIA) 100 MG tablet Take 1 tablet (100 mg total) by mouth daily. 30 tablet 3   No current facility-administered medications for this visit.    Review of Systems Review of Systems He denies chest pain shortness of breath wheezing heartburn. He has some occasional frequency urgency. Skin changes are mild. Remaining system review currently negative. Blood pressure 88/55, height 6\' 1"  (1.854 m), weight 313 lb (141.976 kg).  Physical Exam Physical Exam Blood pressure was a little low today but he has no symptoms of hypo-tension. He is oriented 3 his appearance is normal his mood is pleasant his affect is normal his gait is without assistive device and although slow is not associated with a limp  Currently he has functional range of motion in his upper extremities with no evidence of joint subluxation atrophy weakness tremor or deformity. Skin changes noted include rash chronic and some ecchymosis under the subcutaneous tissue with intact pulses and normal sensation and no evidence of lymph node enlargement  His right knee has diffuse tenderness with localized joint line medial lateral tenderness it remains stable strength is normal his range of motion is somewhat restricted there is small joint effusion skin is intact his distal pulses and temperature are normal he has mild peripheral edema with minimal swelling and varicosities normal sensation no pathologic reflexes  Overall balance normal  Left leg and knee currently not symptomatic and benign.   Data Reviewed   Assessment    Torn lateral meniscus and degenerative arthritis right knee Encounter Diagnoses  Name Primary?  .  Lateral meniscal tear, right, subsequent encounter Yes  . Primary osteoarthritis of right knee        Plan    The plan is to proceed with arthroscopic lateral meniscectomy for the torn lateral meniscus with the understanding and the patient has demonstrated his understanding that his arthritic symptoms worsen status same or improve.  Plan arthroscopy right knee partial lateral meniscectomy       Arther Abbott 03/19/2014, 9:58 AM  Plain films IMPRESSION: Mild degenerative changes and osseous demineralization.   No acute abnormalities.  MRI scan IMPRESSION: 1. Large radial tear involving the body of the lateral meniscus. There is probable peripheral displacement of meniscal tissue into the meniscotibial recess. 2. Underlying lateral compartment degenerative changes  with full-thickness chondral defect over the central portion of the lateral femoral condyle. 3. Intact medial meniscus, cruciate and collateral ligaments. 4. Moderate-sized joint effusion with evidence of synovitis.

## 2014-03-19 NOTE — Telephone Encounter (Signed)
Patient states that he is going to urinate more at night.  He denies any increase in BG.  Readings are around 120 to 150.  He wants to know if this is releted to metformin.  I am doubtful that metformin is causing increased urination.  At that point in the conversation patient states that he has bben drinking more water lately.  I recommended that he not drink more than a few sips water within 2 hours of bedtime and see if this helps.

## 2014-03-19 NOTE — Patient Instructions (Signed)
Surgery 04/04/14 Right knee arthroscopy    You have decided to proceed with operative arthroscopy of the knee. You have decided not to continue with nonoperative measures such as but not limited to oral medication, weight loss, activity modification, physical therapy, bracing, or injection.  We will perform operative arthroscopy of the knee. Some of the risks associated with arthroscopic surgery of the knee include but are not limited to Bleeding Infection Swelling Stiffness Blood clot Pain  If you're not comfortable with these risks and would like to continue with nonoperative treatment please let Dr. Aline Brochure know prior to your surgery.

## 2014-03-21 ENCOUNTER — Telehealth: Payer: Self-pay | Admitting: *Deleted

## 2014-03-21 NOTE — Telephone Encounter (Signed)
Pt notified of results Verbalizes understanding 

## 2014-03-21 NOTE — Telephone Encounter (Signed)
-----   Message from [facility] sent at 03/19/2014  2:38 PM EST ----- Can you call this patient since its dealing with the thyroid.

## 2014-03-25 ENCOUNTER — Other Ambulatory Visit: Payer: Self-pay | Admitting: Nurse Practitioner

## 2014-03-26 ENCOUNTER — Telehealth: Payer: Self-pay | Admitting: Orthopedic Surgery

## 2014-03-26 NOTE — Telephone Encounter (Signed)
Regarding out-patient surgery scheduled at [facility] 04/04/14, CPT [phone], 29880 - no pre-authorization required per primary insurer, Medicare guidelines; secondary [place] follows Medicare guidelines.

## 2014-03-27 ENCOUNTER — Telehealth: Payer: Self-pay | Admitting: Orthopedic Surgery

## 2014-03-28 ENCOUNTER — Other Ambulatory Visit: Payer: Self-pay | Admitting: Nurse Practitioner

## 2014-03-28 NOTE — Telephone Encounter (Signed)
Called patient, no answer 

## 2014-03-28 NOTE — Patient Instructions (Signed)
[place]  03/28/2014   Your procedure is scheduled on:  Friday, 04/04/14  Report to [facility] at [facility] AM.  Call this number if you have problems the morning of surgery: [phone]   Remember:   Do not eat food or drink liquids after midnight.   Take these medicines the morning of surgery with A SIP OF WATER: celexa, levothyroxine, lisinopril, metoprolol, 1/2 your usual dose of Levemir (45 units) Thursday night. DO NOT take your pills that lower your blood sugar. If you have a hypoglycemic episode the morning of surgery, you may drink 4oz of apple juice.    Do not wear jewelry, make-up or nail polish.  Do not wear lotions, powders, or perfumes. You may wear deodorant.  Do not shave 48 hours prior to surgery. Men may shave face and neck.  Do not bring valuables to the hospital.  [facility] is not responsible                  for any belongings or valuables.               Contacts, dentures or bridgework may not be worn into surgery.  Leave suitcase in the car. After surgery it may be brought to your room.  For patients admitted to the hospital, discharge time is determined by your                treatment team.               Patients discharged the day of surgery will not be allowed to drive  home.  Name and phone number of your driver: family  Special Instructions: Incentive Spirometry - Practice and bring it with you on the day of surgery. Shower using CHG 2 nights before surgery and the night before surgery.  If you shower the day of surgery use CHG.  Use special wash - you have one bottle of CHG for all showers.  You should use approximately 1/3 of the bottle for each shower.   Please read over the following fact sheets that you were given: Anesthesia Post-op Instructions and Care and Recovery After Surgery  Arthroscopy, With Meniscus Injury, Care After Refer to this sheet in the next few weeks. These instructions provide you with general information on caring for yourself  after your procedure. Your health care provider may also give you specific instructions. Your treatment has been planned according to the current medical practices, but problems sometimes occur. Call your health care provider if you have any problems or questions after your procedure. WHAT TO EXPECT AFTER THE PROCEDURE After your procedure, it is typical to have the following:  Pain and swelling in your knee.  Constipation.  Difficulty walking. HOME CARE INSTRUCTIONS   Use crutches and do knee exercises as directed by your health care provider.  Apply ice to the injured area:  Put ice in a plastic bag.  Place a towel between your skin and the bag.  Leave the ice on for 15-20 minutes, 3-4 times a day while awake. Do this for the first 2 days.  Rest and raise (elevate) your knee.  Change bandages (dressings) as directed by your health care provider.  Keep the wound dry and clean. The wound may be washed gently with soap and water. Gently blot or dab the wound dry. It is okay to take showers 24-48 hours after surgery. Do not take baths, use swimming pools, or use hot tubs for 14 days,  or as directed by your health care provider.  Only take over-the-counter or prescription medicines for pain, discomfort, or fever as directed by your health care provider.  Continue your normal diet as directed by your health care provider.  Do not lift anything more than 10 pounds or play contact sports for 3 weeks, or as directed by your health care provider.  If a brace was applied, use as directed by your health care provider.  Your health care provider will help with instructions for rehabilitation of your knee. SEEK MEDICAL CARE IF:   You have increased bleeding (more than a small spot) from the wound.  You have redness, swelling, or increasing pain in the wound.  Yellowish-white fluid (pus) is coming from your wound. SEEK IMMEDIATE MEDICAL CARE IF:   You develop a rash.  You have a  fever or persistent symptoms for more than 2-3 days.  You have difficulty breathing.  You have increasing pain with movement of the knee. MAKE SURE YOU:   Understand these instructions.  Will watch your condition.  Will get help right away if you are not doing well or get worse. Document Released: 07/30/2004 Document Revised: 09/12/2012 Document Reviewed: 06/19/2012 [facility] Patient Information 2015 [place], [place]. This information is not intended to replace advice given to you by your health care provider. Make sure you discuss any questions you have with your health care provider. General Anesthesia, Care After Refer to this sheet in the next few weeks. These instructions provide you with information on caring for yourself after your procedure. Your health care provider may also give you more specific instructions. Your treatment has been planned according to current medical practices, but problems sometimes occur. Call your health care provider if you have any problems or questions after your procedure. WHAT TO EXPECT AFTER THE PROCEDURE After the procedure, it is typical to experience:  Sleepiness.  Nausea and vomiting. HOME CARE INSTRUCTIONS  For the first 24 hours after general anesthesia:  Have a responsible person with you.  Do not drive a car. If you are alone, do not take public transportation.  Do not drink alcohol.  Do not take medicine that has not been prescribed by your health care provider.  Do not sign important papers or make important decisions.  You may resume a normal diet and activities as directed by your health care provider.  Change bandages (dressings) as directed.  If you have questions or problems that seem related to general anesthesia, call the hospital and ask for the anesthetist or anesthesiologist on call. SEEK MEDICAL CARE IF:  You have nausea and vomiting that continue the day after anesthesia.  You develop a rash. SEEK IMMEDIATE  MEDICAL CARE IF:   You have difficulty breathing.  You have chest pain.  You have any allergic problems. Document Released: 04/18/2000 Document Revised: 01/15/2013 Document Reviewed: 07/26/2012 [facility] Patient Information 2015 [place], [place]. This information is not intended to replace advice given to you by your health care provider. Make sure you discuss any questions you have with your health care provider.

## 2014-03-31 ENCOUNTER — Encounter (HOSPITAL_COMMUNITY)
Admission: RE | Admit: 2014-03-31 | Discharge: 2014-03-31 | Disposition: A | Discharge status: Home / Self Care | Payer: Medicare Other | Source: Ambulatory Visit | Attending: Orthopedic Surgery | Admitting: Orthopedic Surgery

## 2014-03-31 ENCOUNTER — Encounter (HOSPITAL_COMMUNITY): Payer: Self-pay

## 2014-03-31 ENCOUNTER — Telehealth: Payer: Self-pay | Admitting: Orthopedic Surgery

## 2014-03-31 DIAGNOSIS — Z01818 Encounter for other preprocedural examination: Secondary | ICD-10-CM | POA: Diagnosis not present

## 2014-03-31 DIAGNOSIS — S83281D Other tear of lateral meniscus, current injury, right knee, subsequent encounter: Secondary | ICD-10-CM | POA: Diagnosis not present

## 2014-03-31 DIAGNOSIS — M1711 Unilateral primary osteoarthritis, right knee: Secondary | ICD-10-CM | POA: Diagnosis not present

## 2014-03-31 LAB — BASIC METABOLIC PANEL
Anion gap: 9 (ref 5–15)
BUN: 25 mg/dL — AB (ref 6–23)
CHLORIDE: 100 mmol/L (ref 96–112)
CO2: 26 mmol/L (ref 19–32)
Calcium: 8.6 mg/dL (ref 8.4–10.5)
Creatinine, Ser: 1.24 mg/dL (ref 0.50–1.35)
GFR calc Af Amer: 66 mL/min — ABNORMAL LOW (ref >90)
GFR calc non Af Amer: 57 mL/min — ABNORMAL LOW (ref >90)
GLUCOSE: 301 mg/dL — AB (ref 70–99)
POTASSIUM: 5.3 mmol/L — AB (ref 3.5–5.1)
Sodium: 135 mmol/L (ref 135–145)

## 2014-03-31 LAB — CBC
HCT: 39 % (ref 39.0–52.0)
Hemoglobin: 12.7 g/dL — ABNORMAL LOW (ref 13.0–17.0)
MCH: 33.2 pg (ref 26.0–34.0)
MCHC: 32.6 g/dL (ref 30.0–36.0)
MCV: 102.1 fL — AB (ref 78.0–100.0)
PLATELETS: 145 10*3/uL — AB (ref 150–400)
RBC: 3.82 MIL/uL — ABNORMAL LOW (ref 4.22–5.81)
RDW: 15.4 % (ref 11.5–15.5)
WBC: 8.5 10*3/uL (ref 4.0–10.5)

## 2014-03-31 NOTE — Progress Notes (Signed)
   03/31/14 1113  OBSTRUCTIVE SLEEP APNEA  Have you ever been diagnosed with sleep apnea through a sleep study? No  Do you snore loudly (loud enough to be heard through closed doors)?  0  Do you often feel tired, fatigued, or sleepy during the daytime? 0  Has anyone observed you stop breathing during your sleep? 0  Do you have, or are you being treated for high blood pressure? 1  BMI more than 35 kg/m2? 1  Age over 71 years old? 1  Neck circumference greater than 40 cm/16 inches? 1  Gender: 1

## 2014-03-31 NOTE — Telephone Encounter (Signed)
Call received from [facility] out-patient surgery, [name], asking if there has been a cardiac clearance for surgery scheduled 04/04/14.  Her direct ph# is [phone].

## 2014-03-31 NOTE — Pre-Procedure Instructions (Signed)
Dr Patsey Berthold aware of extensive heart history including aortic valve replacement. He is aware that patient was seen in cardiology 01/2014 and that echo was ok. No formal note in epic about cardiac clearance but note found on top of echo report that states patient is cleared for surgery and  Dr Patsey Berthold is aware of this and is ok with this clearing patient.

## 2014-03-31 NOTE — Pre-Procedure Instructions (Signed)
Patient given information to sign up for my chart at home. 

## 2014-03-31 NOTE — Telephone Encounter (Signed)
Notes Recorded by Dolan Amen, LPN on 0/90/3014 at 9:96 PM Pt. Informed about his echo , pt. Michela Pitcher he is was needing to have back and knee surgery  ------  Notes Recorded by Minus Breeding, MD on 03/09/2014 at 12:53 PM Poor windows but the aortic valve appears to be working well.  No contraindication if he is to have back or stomach surgery.  Please call the patient and send results to Redge Gainer, MD.  Please check to see if this result needs to be forwarded to any of the surgeons.

## 2014-04-01 NOTE — Telephone Encounter (Signed)
Called patient, he could not recall why he wanted a call

## 2014-04-01 NOTE — Pre-Procedure Instructions (Signed)
Labs from PAT, K+ 5.3 and Glucose of 301 shown to Dr Patsey Berthold. No further orders given. Will do fasting CBG am of surgery.

## 2014-04-03 DIAGNOSIS — N4883 Acquired buried penis: Secondary | ICD-10-CM | POA: Insufficient documentation

## 2014-04-03 DIAGNOSIS — N3941 Urge incontinence: Secondary | ICD-10-CM | POA: Diagnosis not present

## 2014-04-03 DIAGNOSIS — E119 Type 2 diabetes mellitus without complications: Secondary | ICD-10-CM | POA: Diagnosis not present

## 2014-04-03 NOTE — H&P (Addendum)
Preop evaluation  Patient ID: [name], male   DOB: 06/13/1945, 69 y.o.   MRN: [id number]    Chief Complaint   Patient presents with   .  Follow-up       Recheck on right knee and discuss surgery.     HPI [name] is a 69 y.o. male.  Who presents for preop evaluation regarding his right knee. The patient was treated and evaluated for pain in his right knee thought to have meniscal tear his MRI shows severe arthritis of the medial compartment and a torn lateral meniscus with displaced fragment. He has an aortic pig valve and we will discuss this with his cardiologist regarding preop antibiotics but Ancef his usually the course. His pain is somewhat diffuse but can be localized to the lateral and medial joint lines and has a dull throbbing quality with occasional stabbing pain of moderate severity. It is worse with activities and is somewhat better with rest but not completely relieved   HPI    Past Medical History   Diagnosis  Date   .  IDDM  01/22/2009   .  BPH (benign prostatic hypertrophy)  04/15/2010   .  DEPRESSION  01/22/2009   .  HYPERCHOLESTEROLEMIA  01/22/2009   .  HYPOTHYROIDISM, POST-RADIATION  01/22/2009   .  Morbid obesity     .  Heart valve replaced     .  Colon polyps     .  Ulcer     .  Heart murmur     .  HYPERTENSION  01/22/2009       dr Casimer Lanius   .  Arthritis     .  Tremors of nervous system         ?ptsd       Past Surgical History   Procedure  Laterality  Date   .  Appendectomy       .  Aortic valve replacement    July 2006       #20 stentless [place] porcine valve   .  Dental surgery    05/2004       Dental extractions   .  Heel spur surgery           resection of heel spur   .  Colonoscopy    04/24/2007       [name]: normal   .  Tonsillectomy       .  Bilateral cataract surg       .  Colonoscopy with esophagogastroduodenoscopy (egd)  N/A  04/05/2012       Procedure: COLONOSCOPY WITH ESOPHAGOGASTRODUODENOSCOPY (EGD);  Surgeon: Daneil Dolin, MD;  Location: [facility];  Service: Endoscopy;  Laterality: N/A;  10;15   .  Thyroidectomy    03/21/2013       DR [name]   .  Thyroidectomy  N/A  03/21/2013       Procedure: THYROIDECTOMY;  Surgeon: [name], MD;  Location: [facility] OR;  Service: General;  Laterality: N/A;       Family History   Problem  Relation  Age of Onset   .  Diabetes         Social History History   Substance Use Topics   .  Smoking status:  Former Smoker       Quit date:  09/20/1961   .  Smokeless tobacco:  Never Used   .  Alcohol Use:  No  Allergies   Allergen  Reactions   .  Phenothiazines  Anaphylaxis   .  Pioglitazone  Swelling       Current Outpatient Prescriptions   Medication  Sig  Dispense  Refill   .  acarbose (PRECOSE) 100 MG tablet  Take 1 tablet (100 mg total) by mouth 2 (two) times daily.  180 tablet  1   .  aspirin 81 MG tablet  Take 81 mg by mouth daily.         .  calcium-vitamin D (OSCAL WITH D) 500-200 MG-UNIT per tablet  Take 2 tablets by mouth 2 (two) times daily.  60 tablet  1   .  citalopram (CELEXA) 20 MG tablet  Take 1 tablet (20 mg total) by mouth daily.  30 tablet  1   .  furosemide (LASIX) 40 MG tablet  Take 1 tablet (40 mg total) by mouth daily.  30 tablet  4   .  glipiZIDE (GLUCOTROL) 5 MG tablet  Take 1 tablet (5 mg total) by mouth 2 (two) times daily before a meal.  180 tablet  1   .  glucose blood (BAYER CONTOUR NEXT TEST) test strip  Test qid Dx E10.9 EZ strips  100 each  2   .  insulin detemir (LEVEMIR) 100 UNIT/ML injection  45 u BID  10 mL  5   .  levothyroxine (SYNTHROID, LEVOTHROID) 200 MCG tablet  Take 1 tablet (200 mcg total) by mouth daily before breakfast.  30 tablet  2   .  lisinopril (PRINIVIL,ZESTRIL) 20 MG tablet  Take 1 tablet (20 mg total) by mouth daily. (Patient taking differently: Take 20 mg by mouth daily. Take 1/2 tablet daily)  30 tablet  4   .  metFORMIN (GLUCOPHAGE) 1000 MG tablet  Take 1 tablet (1,000 mg total) by mouth 2 (two) times  daily with a meal.  180 tablet  3   .  metoprolol tartrate (LOPRESSOR) 25 MG tablet  Take 0.5 tablets (12.5 mg total) by mouth 2 (two) times daily.  60 tablet  4   .  Multiple Vitamins-Minerals (MENS MULTI VITAMIN & MINERAL PO)  Take 1 tablet by mouth daily.         .  Omega-3 Fatty Acids (FISH OIL) 1200 MG CAPS  Take 1-2 capsules by mouth 2 (two) times daily. 1 capsule by mouth q am, 2 capsules by mouth q pm       .  simvastatin (ZOCOR) 40 MG tablet  Take 1 tablet (40 mg total) by mouth daily at 6 PM.  90 tablet  0   .  sitaGLIPtin (JANUVIA) 100 MG tablet  Take 1 tablet (100 mg total) by mouth daily.  30 tablet  3      No current facility-administered medications for this visit.     Review of Systems Review of Systems He denies chest pain shortness of breath wheezing heartburn. He has some occasional frequency urgency. Skin changes are mild. Remaining system review currently negative.  Blood pressure 88/55, height 6\' 1"  (1.854 m), weight 313 lb (141.976 kg).  Physical Exam Physical Exam Blood pressure was a little low today but he has no symptoms of hypo-tension. He is oriented 3 his appearance is normal his mood is pleasant his affect is normal his gait is without assistive device and although slow is not associated with a limp  Currently he has functional range of motion in his upper extremities with no evidence of joint subluxation  atrophy weakness tremor or deformity. Skin changes noted include rash chronic and some ecchymosis under the subcutaneous tissue with intact pulses and normal sensation and no evidence of lymph node enlargement  His right knee has diffuse tenderness with localized joint line medial lateral tenderness it remains stable strength is normal his range of motion is somewhat restricted there is small joint effusion skin is intact his distal pulses and temperature are normal he has mild peripheral edema with minimal swelling and varicosities normal sensation no  pathologic reflexes  Overall balance normal  Left leg and knee currently not symptomatic and benign.   Data Reviewed   Assessment    Torn lateral meniscus and degenerative arthritis right knee Encounter Diagnoses   Name  Primary?   .  Lateral meniscal tear, right, subsequent encounter  Yes   .  Primary osteoarthritis of right knee          Plan    The plan is to proceed with arthroscopic lateral meniscectomy for the torn lateral meniscus with the understanding and the patient has demonstrated his understanding that his arthritic symptoms worsen status same or improve.  Plan arthroscopy right knee partial lateral meniscectomy       Arther Abbott 03/19/2014, 9:58 AM  Plain films IMPRESSION: Mild degenerative changes and osseous demineralization.   No acute abnormalities.  MRI scan IMPRESSION: 1. Large radial tear involving the body of the lateral meniscus. There is probable peripheral displacement of meniscal tissue into the meniscotibial recess. 2. Underlying lateral compartment degenerative changes with full-thickness chondral defect over the central portion of the lateral femoral condyle. 3. Intact medial meniscus, cruciate and collateral ligaments. 4. Moderate-sized joint effusion with evidence of synovitis.

## 2014-04-04 ENCOUNTER — Ambulatory Visit (HOSPITAL_BASED_OUTPATIENT_CLINIC_OR_DEPARTMENT_OTHER)
Admission: RE | Admit: 2014-04-04 | Discharge: 2014-04-04 | Disposition: A | Discharge status: Home / Self Care | Payer: Medicare Other | Source: Ambulatory Visit | Attending: Orthopedic Surgery | Admitting: Orthopedic Surgery

## 2014-04-04 ENCOUNTER — Encounter (HOSPITAL_COMMUNITY)
Admission: RE | Disposition: A | Discharge status: Home / Self Care | Payer: Self-pay | Source: Ambulatory Visit | Attending: Orthopedic Surgery

## 2014-04-04 ENCOUNTER — Ambulatory Visit (HOSPITAL_COMMUNITY): Payer: Medicare Other | Admitting: Anesthesiology

## 2014-04-04 ENCOUNTER — Encounter (HOSPITAL_COMMUNITY): Payer: Self-pay

## 2014-04-04 DIAGNOSIS — Z87891 Personal history of nicotine dependence: Secondary | ICD-10-CM

## 2014-04-04 DIAGNOSIS — Y999 Unspecified external cause status: Secondary | ICD-10-CM

## 2014-04-04 DIAGNOSIS — G8929 Other chronic pain: Secondary | ICD-10-CM | POA: Diagnosis present

## 2014-04-04 DIAGNOSIS — E780 Pure hypercholesterolemia: Secondary | ICD-10-CM

## 2014-04-04 DIAGNOSIS — S83281A Other tear of lateral meniscus, current injury, right knee, initial encounter: Secondary | ICD-10-CM

## 2014-04-04 DIAGNOSIS — M1990 Unspecified osteoarthritis, unspecified site: Secondary | ICD-10-CM | POA: Diagnosis present

## 2014-04-04 DIAGNOSIS — F329 Major depressive disorder, single episode, unspecified: Secondary | ICD-10-CM

## 2014-04-04 DIAGNOSIS — Z79899 Other long term (current) drug therapy: Secondary | ICD-10-CM

## 2014-04-04 DIAGNOSIS — Z833 Family history of diabetes mellitus: Secondary | ICD-10-CM

## 2014-04-04 DIAGNOSIS — E119 Type 2 diabetes mellitus without complications: Secondary | ICD-10-CM | POA: Insufficient documentation

## 2014-04-04 DIAGNOSIS — K219 Gastro-esophageal reflux disease without esophagitis: Secondary | ICD-10-CM | POA: Insufficient documentation

## 2014-04-04 DIAGNOSIS — Z7982 Long term (current) use of aspirin: Secondary | ICD-10-CM | POA: Insufficient documentation

## 2014-04-04 DIAGNOSIS — B9689 Other specified bacterial agents as the cause of diseases classified elsewhere: Secondary | ICD-10-CM | POA: Diagnosis not present

## 2014-04-04 DIAGNOSIS — Y939 Activity, unspecified: Secondary | ICD-10-CM

## 2014-04-04 DIAGNOSIS — J986 Disorders of diaphragm: Secondary | ICD-10-CM | POA: Diagnosis not present

## 2014-04-04 DIAGNOSIS — Z6841 Body Mass Index (BMI) 40.0 and over, adult: Secondary | ICD-10-CM | POA: Insufficient documentation

## 2014-04-04 DIAGNOSIS — Z86010 Personal history of colonic polyps: Secondary | ICD-10-CM

## 2014-04-04 DIAGNOSIS — M545 Low back pain: Secondary | ICD-10-CM | POA: Insufficient documentation

## 2014-04-04 DIAGNOSIS — E538 Deficiency of other specified B group vitamins: Secondary | ICD-10-CM | POA: Diagnosis present

## 2014-04-04 DIAGNOSIS — Y929 Unspecified place or not applicable: Secondary | ICD-10-CM

## 2014-04-04 DIAGNOSIS — E11649 Type 2 diabetes mellitus with hypoglycemia without coma: Secondary | ICD-10-CM | POA: Diagnosis not present

## 2014-04-04 DIAGNOSIS — Z888 Allergy status to other drugs, medicaments and biological substances status: Secondary | ICD-10-CM

## 2014-04-04 DIAGNOSIS — M179 Osteoarthritis of knee, unspecified: Secondary | ICD-10-CM | POA: Insufficient documentation

## 2014-04-04 DIAGNOSIS — R531 Weakness: Secondary | ICD-10-CM | POA: Diagnosis not present

## 2014-04-04 DIAGNOSIS — I739 Peripheral vascular disease, unspecified: Secondary | ICD-10-CM | POA: Diagnosis present

## 2014-04-04 DIAGNOSIS — E890 Postprocedural hypothyroidism: Secondary | ICD-10-CM | POA: Diagnosis present

## 2014-04-04 DIAGNOSIS — E039 Hypothyroidism, unspecified: Secondary | ICD-10-CM

## 2014-04-04 DIAGNOSIS — X58XXXA Exposure to other specified factors, initial encounter: Secondary | ICD-10-CM | POA: Insufficient documentation

## 2014-04-04 DIAGNOSIS — N400 Enlarged prostate without lower urinary tract symptoms: Secondary | ICD-10-CM | POA: Insufficient documentation

## 2014-04-04 DIAGNOSIS — Z794 Long term (current) use of insulin: Secondary | ICD-10-CM | POA: Insufficient documentation

## 2014-04-04 DIAGNOSIS — N401 Enlarged prostate with lower urinary tract symptoms: Secondary | ICD-10-CM | POA: Diagnosis present

## 2014-04-04 DIAGNOSIS — F17200 Nicotine dependence, unspecified, uncomplicated: Secondary | ICD-10-CM | POA: Diagnosis not present

## 2014-04-04 DIAGNOSIS — I359 Nonrheumatic aortic valve disorder, unspecified: Secondary | ICD-10-CM | POA: Diagnosis present

## 2014-04-04 DIAGNOSIS — Z952 Presence of prosthetic heart valve: Secondary | ICD-10-CM

## 2014-04-04 DIAGNOSIS — M659 Synovitis and tenosynovitis, unspecified: Secondary | ICD-10-CM | POA: Insufficient documentation

## 2014-04-04 DIAGNOSIS — S83206A Unspecified tear of unspecified meniscus, current injury, right knee, initial encounter: Secondary | ICD-10-CM | POA: Diagnosis not present

## 2014-04-04 DIAGNOSIS — E785 Hyperlipidemia, unspecified: Secondary | ICD-10-CM | POA: Diagnosis present

## 2014-04-04 DIAGNOSIS — N390 Urinary tract infection, site not specified: Principal | ICD-10-CM | POA: Diagnosis present

## 2014-04-04 DIAGNOSIS — I10 Essential (primary) hypertension: Secondary | ICD-10-CM | POA: Diagnosis not present

## 2014-04-04 DIAGNOSIS — Z9841 Cataract extraction status, right eye: Secondary | ICD-10-CM

## 2014-04-04 DIAGNOSIS — Z9842 Cataract extraction status, left eye: Secondary | ICD-10-CM

## 2014-04-04 DIAGNOSIS — S83289A Other tear of lateral meniscus, current injury, unspecified knee, initial encounter: Secondary | ICD-10-CM | POA: Insufficient documentation

## 2014-04-04 HISTORY — PX: KNEE ARTHROSCOPY WITH LATERAL MENISECTOMY: SHX6193

## 2014-04-04 LAB — POCT I-STAT 4, (NA,K, GLUC, HGB,HCT)
GLUCOSE: 119 mg/dL — AB (ref 70–99)
HEMATOCRIT: 42 % (ref 39.0–52.0)
Hemoglobin: 14.3 g/dL (ref 13.0–17.0)
POTASSIUM: 4.8 mmol/L (ref 3.5–5.1)
SODIUM: 139 mmol/L (ref 135–145)

## 2014-04-04 LAB — GLUCOSE, CAPILLARY: Glucose-Capillary: 147 mg/dL — ABNORMAL HIGH (ref 70–99)

## 2014-04-04 SURGERY — ARTHROSCOPY, KNEE, WITH LATERAL MENISCECTOMY
Anesthesia: General | Site: Knee | Laterality: Right

## 2014-04-04 MED ORDER — FENTANYL CITRATE 0.05 MG/ML IJ SOLN
INTRAMUSCULAR | Status: AC
  Filled 2014-04-04: qty 5

## 2014-04-04 MED ORDER — MIDAZOLAM HCL 2 MG/2ML IJ SOLN
1.0000 mg | INTRAMUSCULAR | Status: DC | PRN
  Administered 2014-04-04: 2 mg via INTRAVENOUS

## 2014-04-04 MED ORDER — SODIUM CHLORIDE 0.9 % IJ SOLN
INTRAMUSCULAR | Status: AC
  Filled 2014-04-04: qty 10

## 2014-04-04 MED ORDER — EPHEDRINE SULFATE 50 MG/ML IJ SOLN
INTRAMUSCULAR | Status: AC
  Filled 2014-04-04: qty 1

## 2014-04-04 MED ORDER — SUCCINYLCHOLINE CHLORIDE 20 MG/ML IJ SOLN
INTRAMUSCULAR | Status: AC
  Filled 2014-04-04: qty 1

## 2014-04-04 MED ORDER — ONDANSETRON HCL 4 MG/2ML IJ SOLN: 4.0000 mg | Freq: Once | INTRAMUSCULAR | Status: DC

## 2014-04-04 MED ORDER — PROMETHAZINE HCL 12.5 MG PO TABS: 12.5000 mg | ORAL_TABLET | Freq: Four times a day (QID) | ORAL | Status: DC | PRN

## 2014-04-04 MED ORDER — PROPOFOL 10 MG/ML IV BOLUS
INTRAVENOUS | Status: DC | PRN
  Administered 2014-04-04: 160 mg via INTRAVENOUS

## 2014-04-04 MED ORDER — BUPIVACAINE-EPINEPHRINE (PF) 0.5% -1:200000 IJ SOLN
INTRAMUSCULAR | Status: DC | PRN
  Administered 2014-04-04: 601 mL

## 2014-04-04 MED ORDER — EPINEPHRINE HCL 1 MG/ML IJ SOLN
INTRAMUSCULAR | Status: AC
  Filled 2014-04-04: qty 4

## 2014-04-04 MED ORDER — PROPOFOL 10 MG/ML IV BOLUS
INTRAVENOUS | Status: AC
  Filled 2014-04-04: qty 20

## 2014-04-04 MED ORDER — SUCCINYLCHOLINE CHLORIDE 20 MG/ML IJ SOLN: INTRAMUSCULAR | Status: DC | PRN

## 2014-04-04 MED ORDER — CEFAZOLIN SODIUM-DEXTROSE 2-3 GM-% IV SOLR
INTRAVENOUS | Status: AC
  Filled 2014-04-04: qty 50

## 2014-04-04 MED ORDER — CEFAZOLIN SODIUM 1-5 GM-% IV SOLN
INTRAVENOUS | Status: AC
  Filled 2014-04-04: qty 50

## 2014-04-04 MED ORDER — ROCURONIUM BROMIDE 100 MG/10ML IV SOLN
INTRAVENOUS | Status: DC | PRN
  Administered 2014-04-04: 8 mg via INTRAVENOUS

## 2014-04-04 MED ORDER — ONDANSETRON HCL 4 MG/2ML IJ SOLN: 4.0000 mg | Freq: Once | INTRAMUSCULAR | Status: DC | PRN

## 2014-04-04 MED ORDER — FENTANYL CITRATE 0.05 MG/ML IJ SOLN: 25.0000 ug | INTRAMUSCULAR | Status: DC | PRN

## 2014-04-04 MED ORDER — DEXAMETHASONE SODIUM PHOSPHATE 4 MG/ML IJ SOLN
4.0000 mg | Freq: Once | INTRAMUSCULAR | Status: AC
  Administered 2014-04-04: 4 mg via INTRAVENOUS

## 2014-04-04 MED ORDER — HYDROCODONE-ACETAMINOPHEN 10-325 MG PO TABS: 1.0000 | ORAL_TABLET | Freq: Four times a day (QID) | ORAL | Status: DC | PRN

## 2014-04-04 MED ORDER — HYDROCODONE-ACETAMINOPHEN 5-325 MG PO TABS
2.0000 | ORAL_TABLET | Freq: Once | ORAL | Status: AC
  Administered 2014-04-04: 2 via ORAL
  Filled 2014-04-04: qty 2

## 2014-04-04 MED ORDER — ROCURONIUM BROMIDE 50 MG/5ML IV SOLN
INTRAVENOUS | Status: AC
  Filled 2014-04-04: qty 1

## 2014-04-04 MED ORDER — FENTANYL CITRATE 0.05 MG/ML IJ SOLN
INTRAMUSCULAR | Status: AC
  Filled 2014-04-04: qty 2

## 2014-04-04 MED ORDER — BUPIVACAINE-EPINEPHRINE (PF) 0.5% -1:200000 IJ SOLN
INTRAMUSCULAR | Status: AC
  Filled 2014-04-04: qty 60

## 2014-04-04 MED ORDER — EPHEDRINE SULFATE 50 MG/ML IJ SOLN
INTRAMUSCULAR | Status: DC | PRN
  Administered 2014-04-04 (×3): 10 mg via INTRAVENOUS

## 2014-04-04 MED ORDER — ONDANSETRON HCL 4 MG/2ML IJ SOLN
INTRAMUSCULAR | Status: AC
  Filled 2014-04-04: qty 2

## 2014-04-04 MED ORDER — DEXTROSE 5 % IV SOLN
3.0000 g | INTRAVENOUS | Status: AC
  Administered 2014-04-04: 3 g via INTRAVENOUS
  Filled 2014-04-04: qty 3000

## 2014-04-04 MED ORDER — LACTATED RINGERS IV SOLN
INTRAVENOUS | Status: DC
  Administered 2014-04-04: 07:00:00 via INTRAVENOUS

## 2014-04-04 MED ORDER — CHLORHEXIDINE GLUCONATE 4 % EX LIQD: 60.0000 mL | Freq: Once | CUTANEOUS | Status: DC

## 2014-04-04 MED ORDER — MIDAZOLAM HCL 2 MG/2ML IJ SOLN
INTRAMUSCULAR | Status: AC
  Filled 2014-04-04: qty 2

## 2014-04-04 MED ORDER — LIDOCAINE HCL 1 % IJ SOLN
INTRAMUSCULAR | Status: DC | PRN
  Administered 2014-04-04: 40 mg via INTRADERMAL

## 2014-04-04 MED ORDER — SODIUM CHLORIDE 0.9 % IR SOLN
Status: DC | PRN
  Administered 2014-04-04 (×2): 3000 mL

## 2014-04-04 MED ORDER — SUCCINYLCHOLINE CHLORIDE 20 MG/ML IJ SOLN
INTRAMUSCULAR | Status: DC | PRN
  Administered 2014-04-04: 120 mg via INTRAVENOUS

## 2014-04-04 MED ORDER — FENTANYL CITRATE 0.05 MG/ML IJ SOLN
INTRAMUSCULAR | Status: DC | PRN
  Administered 2014-04-04 (×3): 50 ug via INTRAVENOUS

## 2014-04-04 MED ORDER — LIDOCAINE HCL (PF) 1 % IJ SOLN
INTRAMUSCULAR | Status: AC
  Filled 2014-04-04: qty 5

## 2014-04-04 MED ORDER — FENTANYL CITRATE 0.05 MG/ML IJ SOLN
25.0000 ug | INTRAMUSCULAR | Status: AC
  Administered 2014-04-04: 25 ug via INTRAVENOUS

## 2014-04-04 MED ORDER — DEXAMETHASONE SODIUM PHOSPHATE 4 MG/ML IJ SOLN
INTRAMUSCULAR | Status: AC
  Filled 2014-04-04: qty 1

## 2014-04-04 SURGICAL SUPPLY — 62 items
ARTHROWAND PARAGON T2 (SURGICAL WAND)
BAG HAMPER (MISCELLANEOUS) ×2 IMPLANT
BANDAGE ELASTIC 6 VELCRO NS (GAUZE/BANDAGES/DRESSINGS) ×2 IMPLANT
BIT DRILL 2.0MX128MM (BIT) ×2 IMPLANT
BLADE 11 SAFETY STRL DISP (BLADE) ×2 IMPLANT
BLADE AGGRESSIVE PLUS 4.0 (BLADE) ×2 IMPLANT
CHLORAPREP W/TINT 26ML (MISCELLANEOUS) ×3 IMPLANT
CLOTH BEACON ORANGE TIMEOUT ST (SAFETY) ×2 IMPLANT
COOLER CRYO IC GRAV AND TUBE (ORTHOPEDIC SUPPLIES) ×2 IMPLANT
COVER PROBE W GEL 5X96 (DRAPES) ×2 IMPLANT
CUFF CRYO KNEE LG 20X31 COOLER (ORTHOPEDIC SUPPLIES) IMPLANT
CUFF CRYO KNEE18X23 MED (MISCELLANEOUS) ×1 IMPLANT
CUFF TOURNIQUET SINGLE 34IN LL (TOURNIQUET CUFF) ×1 IMPLANT
CUFF TOURNIQUET SINGLE 44IN (TOURNIQUET CUFF) IMPLANT
CUTTER ANGLED DBL BITE 4.5 (BURR) IMPLANT
DECANTER SPIKE VIAL GLASS SM (MISCELLANEOUS) ×4 IMPLANT
ELECT REM PT RETURN 9FT ADLT (ELECTROSURGICAL)
ELECTRODE REM PT RTRN 9FT ADLT (ELECTROSURGICAL) IMPLANT
GAUZE SPONGE 4X4 12PLY STRL (GAUZE/BANDAGES/DRESSINGS) ×2 IMPLANT
GAUZE SPONGE 4X4 16PLY XRAY LF (GAUZE/BANDAGES/DRESSINGS) ×2 IMPLANT
GAUZE XEROFORM 5X9 LF (GAUZE/BANDAGES/DRESSINGS) ×2 IMPLANT
GLOVE BIOGEL PI IND STRL 7.5 (GLOVE) IMPLANT
GLOVE BIOGEL PI INDICATOR 7.5 (GLOVE) ×1
GLOVE EXAM NITRILE MD LF STRL (GLOVE) ×1 IMPLANT
GLOVE SKINSENSE NS SZ8.0 LF (GLOVE) ×1
GLOVE SKINSENSE STRL SZ8.0 LF (GLOVE) ×1 IMPLANT
GLOVE SS N UNI LF 8.5 STRL (GLOVE) ×2 IMPLANT
GLOVE SURG SS PI 7.5 STRL IVOR (GLOVE) ×1 IMPLANT
GOWN PREVENTION PLUS XLARGE (GOWN DISPOSABLE) ×2 IMPLANT
GOWN STRL REUS W/TWL LRG LVL3 (GOWN DISPOSABLE) ×4 IMPLANT
GOWN STRL REUS W/TWL XL LVL3 (GOWN DISPOSABLE) ×1 IMPLANT
HLDR LEG FOAM (MISCELLANEOUS) ×1 IMPLANT
IV NS IRRIG 3000ML ARTHROMATIC (IV SOLUTION) ×5 IMPLANT
KIT BLADEGUARD II DBL (SET/KITS/TRAYS/PACK) ×2 IMPLANT
KIT ROOM TURNOVER AP CYSTO (KITS) ×2 IMPLANT
LEG HOLDER FOAM (MISCELLANEOUS) ×1
MANIFOLD NEPTUNE II (INSTRUMENTS) ×2 IMPLANT
MARKER SKIN DUAL TIP RULER LAB (MISCELLANEOUS) ×2 IMPLANT
NDL HYPO 18GX1.5 BLUNT FILL (NEEDLE) ×1 IMPLANT
NDL HYPO 21X1.5 SAFETY (NEEDLE) ×1 IMPLANT
NDL SPNL 18GX3.5 QUINCKE PK (NEEDLE) ×1 IMPLANT
NEEDLE HYPO 18GX1.5 BLUNT FILL (NEEDLE) ×2 IMPLANT
NEEDLE HYPO 21X1.5 SAFETY (NEEDLE) ×2 IMPLANT
NEEDLE SPNL 18GX3.5 QUINCKE PK (NEEDLE) ×2 IMPLANT
NS IRRIG 1000ML POUR BTL (IV SOLUTION) ×2 IMPLANT
PACK ARTHRO LIMB DRAPE STRL (MISCELLANEOUS) ×2 IMPLANT
PAD ABD 5X9 TENDERSORB (GAUZE/BANDAGES/DRESSINGS) ×2 IMPLANT
PAD ARMBOARD 7.5X6 YLW CONV (MISCELLANEOUS) ×2 IMPLANT
PADDING CAST COTTON 6X4 STRL (CAST SUPPLIES) ×2 IMPLANT
PADDING WEBRIL 6 STERILE (GAUZE/BANDAGES/DRESSINGS) ×1 IMPLANT
SET ARTHROSCOPY INST (INSTRUMENTS) ×2 IMPLANT
SET ARTHROSCOPY PUMP TUBE (IRRIGATION / IRRIGATOR) ×2 IMPLANT
SET BASIN LINEN APH (SET/KITS/TRAYS/PACK) ×2 IMPLANT
SPONGE GAUZE 4X4 12PLY (GAUZE/BANDAGES/DRESSINGS) ×1 IMPLANT
SUT ETHILON 3 0 FSL (SUTURE) ×2 IMPLANT
SYR 30ML LL (SYRINGE) ×2 IMPLANT
SYRINGE 10CC LL (SYRINGE) ×2 IMPLANT
WAND 50 DEG COVAC W/CORD (SURGICAL WAND) IMPLANT
WAND 90 DEG TURBOVAC W/CORD (SURGICAL WAND) IMPLANT
WAND ARTHRO PARAGON T2 (SURGICAL WAND) IMPLANT
WATER STERILE IRR 1000ML POUR (IV SOLUTION) ×2 IMPLANT
YANKAUER SUCT BULB TIP 10FT TU (MISCELLANEOUS) ×6 IMPLANT

## 2014-04-04 NOTE — Brief Op Note (Signed)
04/04/2014  8:38 AM  PATIENT:  [name]  69 y.o. male  PRE-OPERATIVE DIAGNOSIS:  lateral meniscus tear right knee  POST-OPERATIVE DIAGNOSIS:  lateral meniscus tear right knee, degenerative arthritis  Surgical findings torn lateral meniscus, grade 2 chondral lesions lateral femoral condyle, trochlea, medial compartment normal, anterior cruciate ligament PCL normal, large amount of synovitis throughout the joint  PROCEDURE:  Procedure(s): KNEE ARTHROSCOPY WITH LATERAL MENISECTOMY (Right)   Knee arthroscopy dictation  The patient was identified in the preoperative holding area using 2 approved identification mechanisms. The chart was reviewed and updated. The surgical site was confirmed and marked with an indelible marker.  The patient was taken to the operating room for anesthesia. After successful  general anesthesia, 3 g Ancef was used as IV antibiotics.  The patient was placed in the supine position with the (right lower extremity) operative extremity in an arthroscopic leg holder and the opposite extremity in a padded leg holder.  The timeout was executed.  A lateral portal was established with an 11 blade and the scope was introduced into the joint. A diagnostic arthroscopy was performed in circumferential manner examining the entire knee joint. A medial portal was established and the diagnostic arthroscopy was repeated using a probe to palpate intra-articular structures as they were encountered.     The lateral meniscus was resected using a shaver. The remaining meniscal fragments were removed with a motorized shaver. The meniscus was balanced with a combination of a motorized shaver and a 90 ArthroCare wand until a stable rim was obtained.  The arthroscopic pump was placed on the wash mode and any excess debris was removed from the joint using suction.  60 cc of Marcaine with epinephrine was injected through the arthroscope.  The portals were closed with 3-0 nylon  suture.  A sterile bandage, Ace wrap and Cryo/Cuff was placed and the Cryo/Cuff was activated. The patient was taken to the recovery room in stable condition.   SURGEON:  Surgeon(s) and Role:    * Carole Civil, MD - Primary  PHYSICIAN ASSISTANT:   ASSISTANTS: none   ANESTHESIA:   general  EBL:  Total I/O In: 700 [I.V.:700] Out: 10 [Blood:10]  BLOOD ADMINISTERED:none  DRAINS: none   LOCAL MEDICATIONS USED:  MARCAINE     SPECIMEN:  No Specimen  DISPOSITION OF SPECIMEN:  N/A  COUNTS:  YES  TOURNIQUET:    DICTATION: .Dragon Dictation  PLAN OF CARE: Discharge to home after PACU  PATIENT DISPOSITION:  PACU - hemodynamically stable.   Delay start of Pharmacological VTE agent (>24hrs) due to surgical blood loss or risk of bleeding: not applicable

## 2014-04-04 NOTE — Anesthesia Postprocedure Evaluation (Signed)
  Anesthesia Post-op Note  Patient: [name]  Procedure(s) Performed: Procedure(s): KNEE ARTHROSCOPY WITH LATERAL MENISECTOMY (Right)  Patient Location: PACU  Anesthesia Type:General  Level of Consciousness: awake, alert  and oriented  Airway and Oxygen Therapy: Patient Spontanous Breathing and Patient connected to face mask oxygen  Post-op Pain: none  Post-op Assessment: Post-op Vital signs reviewed, Patient's Cardiovascular Status Stable, Respiratory Function Stable, Patent Airway and No signs of Nausea or vomiting  Post-op Vital Signs: Reviewed and stable  Last Vitals:  Filed Vitals:   04/04/14 0725  BP: 140/70  Pulse:   Temp:   Resp: 13    Complications: No apparent anesthesia complications

## 2014-04-04 NOTE — Addendum Note (Signed)
Addendum  created 04/04/14 1009 by Ollen Bowl, CRNA   Modules edited: Anesthesia Blocks and Procedures, Anesthesia Flowsheet, Clinical Notes   Clinical Notes:  File: [id number]

## 2014-04-04 NOTE — Anesthesia Procedure Notes (Signed)
Procedure Name: Intubation Date/Time: 04/04/2014 7:45 AM Performed by: Tressie Stalker E Pre-anesthesia Checklist: Patient identified, Patient being monitored, Timeout performed, Emergency Drugs available and Suction available Patient Re-evaluated:Patient Re-evaluated prior to inductionOxygen Delivery Method: Circle System Utilized Preoxygenation: Pre-oxygenation with 100% oxygen Intubation Type: IV induction, Rapid sequence and Cricoid Pressure applied Ventilation: Mask ventilation without difficulty Laryngoscope Size: Mac and 4 Grade View: Grade II Tube type: Oral Tube size: 8.0 mm Number of attempts: 1 Airway Equipment and Method: Stylet Placement Confirmation: ETT inserted through vocal cords under direct vision,  positive ETCO2 and breath sounds checked- equal and bilateral Secured at: 21 cm Tube secured with: Tape Dental Injury: Teeth and Oropharynx as per pre-operative assessment

## 2014-04-04 NOTE — Anesthesia Preprocedure Evaluation (Addendum)
Anesthesia Evaluation  Patient identified by MRN, date of birth, ID band Patient awake    Reviewed: Allergy & Precautions, H&P , NPO status , Patient's Chart, lab work & pertinent test results, reviewed documented beta blocker date and time   Airway Mallampati: III  TM Distance: >3 FB   Mouth opening: Limited Mouth Opening  Dental  (+) Dental Advisory Given, Edentulous Upper, Partial Lower, Poor Dentition   Pulmonary former smoker,  breath sounds clear to auscultation        Cardiovascular hypertension, Pt. on medications and Pt. 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 Wall thickness was normal. Systolic function was normal. The estimated ejection fraction was in the range of 55% to 60%. Wall motion was normal; there were no regional wall motion abnormalities. - Aortic valve: AVR poorly visualized. No obvious abnormal gradient by doppler and no AR. LVOT is small A mechanical prosthesis was present.   Neuro/Psych PSYCHIATRIC DISORDERS Depression    GI/Hepatic GERD-  Medicated,  Endo/Other  diabetes, Type 2, Insulin DependentHypothyroidism Morbid obesity  Renal/GU      Musculoskeletal  (+) Arthritis - (chronic LBP),   Abdominal   Peds  Hematology   Anesthesia Other Findings   Reproductive/Obstetrics                            Anesthesia Physical Anesthesia Plan  ASA: III  Anesthesia Plan: General   Post-op Pain Management:    Induction: Intravenous, Rapid sequence and Cricoid pressure planned  Airway Management Planned: Oral ETT  Additional Equipment:   Intra-op Plan:   Post-operative Plan: Extubation in OR  Informed Consent: I have reviewed the  patients History and Physical, chart, labs and discussed the procedure including the risks, benefits and alternatives for the proposed anesthesia with the patient or authorized representative who has indicated his/her understanding and acceptance.   Dental advisory given  Plan Discussed with: CRNA, Anesthesiologist and Surgeon  Anesthesia Plan Comments:        Anesthesia Quick Evaluation

## 2014-04-04 NOTE — Transfer of Care (Signed)
Immediate Anesthesia Transfer of Care Note  Patient: [name]  Procedure(s) Performed: Procedure(s): KNEE ARTHROSCOPY WITH LATERAL MENISECTOMY (Right)  Patient Location: PACU  Anesthesia Type:General  Level of Consciousness: awake  Airway & Oxygen Therapy: Patient Spontanous Breathing and Patient connected to face mask oxygen  Post-op Assessment: Report given to RN  Post vital signs: Reviewed and stable  Last Vitals:  Filed Vitals:   04/04/14 0725  BP: 140/70  Pulse:   Temp:   Resp: 13    Complications: No apparent anesthesia complications

## 2014-04-04 NOTE — Op Note (Signed)
04/04/2014  8:38 AM  PATIENT:  [name]  69 y.o. male  PRE-OPERATIVE DIAGNOSIS:  lateral meniscus tear right knee  POST-OPERATIVE DIAGNOSIS:  lateral meniscus tear right knee, degenerative arthritis  Surgical findings torn lateral meniscus, grade 2 chondral lesions lateral femoral condyle, trochlea, medial compartment normal, anterior cruciate ligament PCL normal, large amount of synovitis throughout the joint  PROCEDURE:  Procedure(s): KNEE ARTHROSCOPY WITH LATERAL MENISECTOMY (Right)   Knee arthroscopy dictation  The patient was identified in the preoperative holding area using 2 approved identification mechanisms. The chart was reviewed and updated. The surgical site was confirmed and marked with an indelible marker.  The patient was taken to the operating room for anesthesia. After successful  general anesthesia, 3 g Ancef was used as IV antibiotics.  The patient was placed in the supine position with the (right lower extremity) operative extremity in an arthroscopic leg holder and the opposite extremity in a padded leg holder.  The timeout was executed.  A lateral portal was established with an 11 blade and the scope was introduced into the joint. A diagnostic arthroscopy was performed in circumferential manner examining the entire knee joint. A medial portal was established and the diagnostic arthroscopy was repeated using a probe to palpate intra-articular structures as they were encountered.     The lateral meniscus was resected using a shaver. The remaining meniscal fragments were removed with a motorized shaver. The meniscus was balanced with a combination of a motorized shaver and a 90 ArthroCare wand until a stable rim was obtained.  The arthroscopic pump was placed on the wash mode and any excess debris was removed from the joint using suction.  60 cc of Marcaine with epinephrine was injected through the arthroscope.  The portals were closed with 3-0 nylon  suture.  A sterile bandage, Ace wrap and Cryo/Cuff was placed and the Cryo/Cuff was activated. The patient was taken to the recovery room in stable condition.   SURGEON:  Surgeon(s) and Role:    * Carole Civil, MD - Primary  PHYSICIAN ASSISTANT:   ASSISTANTS: none   ANESTHESIA:   general  EBL:  Total I/O In: 700 [I.V.:700] Out: 10 [Blood:10]  BLOOD ADMINISTERED:none  DRAINS: none   LOCAL MEDICATIONS USED:  MARCAINE     SPECIMEN:  No Specimen  DISPOSITION OF SPECIMEN:  N/A  COUNTS:  YES  TOURNIQUET:    DICTATION: .Dragon Dictation  PLAN OF CARE: Discharge to home after PACU  PATIENT DISPOSITION:  PACU - hemodynamically stable.   Delay start of Pharmacological VTE agent (>24hrs) due to surgical blood loss or risk of bleeding: not applicable

## 2014-04-04 NOTE — Interval H&P Note (Signed)
History and Physical Interval Note:  04/04/2014 7:08 AM  [name]  has presented today for surgery, with the diagnosis of lateral meniscus tear right knee  The various methods of treatment have been discussed with the patient and family. After consideration of risks, benefits and other options for treatment, the patient has consented to  Procedure(s): RIGHT KNEE ARTHROSCOPY WITH LATERAL MENISECTOMY (Right) as a surgical intervention .  The patient's history has been reviewed, patient examined, no change in status, stable for surgery.  I have reviewed the patient's chart and labs.  Questions were answered to the patient's satisfaction.     Arther Abbott

## 2014-04-06 ENCOUNTER — Encounter (HOSPITAL_COMMUNITY): Payer: Self-pay | Admitting: Emergency Medicine

## 2014-04-06 ENCOUNTER — Inpatient Hospital Stay (HOSPITAL_COMMUNITY)
Admission: EM | Admit: 2014-04-06 | Discharge: 2014-04-09 | DRG: 988 | Disposition: A | Discharge status: Skilled Nursing Facility | Payer: Medicare Other | Attending: Internal Medicine | Admitting: Internal Medicine

## 2014-04-06 ENCOUNTER — Emergency Department (HOSPITAL_COMMUNITY): Payer: Medicare Other

## 2014-04-06 DIAGNOSIS — Z87891 Personal history of nicotine dependence: Secondary | ICD-10-CM | POA: Diagnosis not present

## 2014-04-06 DIAGNOSIS — Z86010 Personal history of colonic polyps: Secondary | ICD-10-CM | POA: Diagnosis not present

## 2014-04-06 DIAGNOSIS — E538 Deficiency of other specified B group vitamins: Secondary | ICD-10-CM | POA: Diagnosis not present

## 2014-04-06 DIAGNOSIS — I359 Nonrheumatic aortic valve disorder, unspecified: Secondary | ICD-10-CM

## 2014-04-06 DIAGNOSIS — R41841 Cognitive communication deficit: Secondary | ICD-10-CM | POA: Diagnosis not present

## 2014-04-06 DIAGNOSIS — R531 Weakness: Secondary | ICD-10-CM

## 2014-04-06 DIAGNOSIS — E785 Hyperlipidemia, unspecified: Secondary | ICD-10-CM | POA: Diagnosis present

## 2014-04-06 DIAGNOSIS — E890 Postprocedural hypothyroidism: Secondary | ICD-10-CM | POA: Diagnosis present

## 2014-04-06 DIAGNOSIS — E11649 Type 2 diabetes mellitus with hypoglycemia without coma: Secondary | ICD-10-CM | POA: Diagnosis present

## 2014-04-06 DIAGNOSIS — G8929 Other chronic pain: Secondary | ICD-10-CM | POA: Diagnosis present

## 2014-04-06 DIAGNOSIS — M1990 Unspecified osteoarthritis, unspecified site: Secondary | ICD-10-CM | POA: Diagnosis present

## 2014-04-06 DIAGNOSIS — F17200 Nicotine dependence, unspecified, uncomplicated: Secondary | ICD-10-CM | POA: Diagnosis not present

## 2014-04-06 DIAGNOSIS — Z96651 Presence of right artificial knee joint: Secondary | ICD-10-CM | POA: Diagnosis not present

## 2014-04-06 DIAGNOSIS — B9689 Other specified bacterial agents as the cause of diseases classified elsewhere: Secondary | ICD-10-CM | POA: Diagnosis not present

## 2014-04-06 DIAGNOSIS — I10 Essential (primary) hypertension: Secondary | ICD-10-CM | POA: Diagnosis not present

## 2014-04-06 DIAGNOSIS — Z794 Long term (current) use of insulin: Secondary | ICD-10-CM | POA: Diagnosis not present

## 2014-04-06 DIAGNOSIS — R278 Other lack of coordination: Secondary | ICD-10-CM | POA: Diagnosis not present

## 2014-04-06 DIAGNOSIS — Z954 Presence of other heart-valve replacement: Secondary | ICD-10-CM | POA: Diagnosis not present

## 2014-04-06 DIAGNOSIS — Z6841 Body Mass Index (BMI) 40.0 and over, adult: Secondary | ICD-10-CM | POA: Diagnosis not present

## 2014-04-06 DIAGNOSIS — S83281A Other tear of lateral meniscus, current injury, right knee, initial encounter: Secondary | ICD-10-CM | POA: Diagnosis present

## 2014-04-06 DIAGNOSIS — F329 Major depressive disorder, single episode, unspecified: Secondary | ICD-10-CM | POA: Diagnosis present

## 2014-04-06 DIAGNOSIS — Z833 Family history of diabetes mellitus: Secondary | ICD-10-CM | POA: Diagnosis not present

## 2014-04-06 DIAGNOSIS — J986 Disorders of diaphragm: Secondary | ICD-10-CM | POA: Diagnosis not present

## 2014-04-06 DIAGNOSIS — N401 Enlarged prostate with lower urinary tract symptoms: Secondary | ICD-10-CM | POA: Diagnosis present

## 2014-04-06 DIAGNOSIS — Z7982 Long term (current) use of aspirin: Secondary | ICD-10-CM | POA: Diagnosis not present

## 2014-04-06 DIAGNOSIS — E119 Type 2 diabetes mellitus without complications: Secondary | ICD-10-CM | POA: Diagnosis not present

## 2014-04-06 DIAGNOSIS — K219 Gastro-esophageal reflux disease without esophagitis: Secondary | ICD-10-CM | POA: Diagnosis present

## 2014-04-06 DIAGNOSIS — Z888 Allergy status to other drugs, medicaments and biological substances status: Secondary | ICD-10-CM | POA: Diagnosis not present

## 2014-04-06 DIAGNOSIS — Z9842 Cataract extraction status, left eye: Secondary | ICD-10-CM | POA: Diagnosis not present

## 2014-04-06 DIAGNOSIS — R262 Difficulty in walking, not elsewhere classified: Secondary | ICD-10-CM | POA: Diagnosis not present

## 2014-04-06 DIAGNOSIS — R030 Elevated blood-pressure reading, without diagnosis of hypertension: Secondary | ICD-10-CM | POA: Diagnosis not present

## 2014-04-06 DIAGNOSIS — I739 Peripheral vascular disease, unspecified: Secondary | ICD-10-CM | POA: Diagnosis present

## 2014-04-06 DIAGNOSIS — S83289A Other tear of lateral meniscus, current injury, unspecified knee, initial encounter: Secondary | ICD-10-CM | POA: Diagnosis present

## 2014-04-06 DIAGNOSIS — M545 Low back pain: Secondary | ICD-10-CM | POA: Diagnosis present

## 2014-04-06 DIAGNOSIS — X58XXXA Exposure to other specified factors, initial encounter: Secondary | ICD-10-CM | POA: Diagnosis present

## 2014-04-06 DIAGNOSIS — M179 Osteoarthritis of knee, unspecified: Secondary | ICD-10-CM | POA: Diagnosis present

## 2014-04-06 DIAGNOSIS — E1142 Type 2 diabetes mellitus with diabetic polyneuropathy: Secondary | ICD-10-CM | POA: Diagnosis not present

## 2014-04-06 DIAGNOSIS — Z9841 Cataract extraction status, right eye: Secondary | ICD-10-CM | POA: Diagnosis not present

## 2014-04-06 DIAGNOSIS — R269 Unspecified abnormalities of gait and mobility: Secondary | ICD-10-CM | POA: Diagnosis not present

## 2014-04-06 DIAGNOSIS — E1140 Type 2 diabetes mellitus with diabetic neuropathy, unspecified: Secondary | ICD-10-CM

## 2014-04-06 DIAGNOSIS — M6281 Muscle weakness (generalized): Secondary | ICD-10-CM | POA: Diagnosis not present

## 2014-04-06 DIAGNOSIS — E038 Other specified hypothyroidism: Secondary | ICD-10-CM | POA: Diagnosis not present

## 2014-04-06 DIAGNOSIS — E780 Pure hypercholesterolemia: Secondary | ICD-10-CM | POA: Diagnosis present

## 2014-04-06 DIAGNOSIS — R358 Other polyuria: Secondary | ICD-10-CM | POA: Diagnosis not present

## 2014-04-06 DIAGNOSIS — N390 Urinary tract infection, site not specified: Principal | ICD-10-CM | POA: Diagnosis present

## 2014-04-06 DIAGNOSIS — Z79899 Other long term (current) drug therapy: Secondary | ICD-10-CM | POA: Diagnosis not present

## 2014-04-06 DIAGNOSIS — Z952 Presence of prosthetic heart valve: Secondary | ICD-10-CM | POA: Diagnosis not present

## 2014-04-06 DIAGNOSIS — M659 Synovitis and tenosynovitis, unspecified: Secondary | ICD-10-CM | POA: Diagnosis present

## 2014-04-06 LAB — URINALYSIS, ROUTINE W REFLEX MICROSCOPIC
Bilirubin Urine: NEGATIVE
GLUCOSE, UA: NEGATIVE mg/dL
Ketones, ur: 40 mg/dL — AB
Nitrite: NEGATIVE
Protein, ur: NEGATIVE mg/dL
SPECIFIC GRAVITY, URINE: 1.02 (ref 1.005–1.030)
UROBILINOGEN UA: 0.2 mg/dL (ref 0.0–1.0)
pH: 5.5 (ref 5.0–8.0)

## 2014-04-06 LAB — GLUCOSE, CAPILLARY
Glucose-Capillary: 230 mg/dL — ABNORMAL HIGH (ref 70–99)
Glucose-Capillary: 229 mg/dL — ABNORMAL HIGH (ref 70–99)

## 2014-04-06 LAB — BASIC METABOLIC PANEL
Anion gap: 8 (ref 5–15)
BUN: 30 mg/dL — ABNORMAL HIGH (ref 6–23)
CHLORIDE: 102 mmol/L (ref 96–112)
CO2: 27 mmol/L (ref 19–32)
Calcium: 9 mg/dL (ref 8.4–10.5)
Creatinine, Ser: 1.28 mg/dL (ref 0.50–1.35)
GFR, EST AFRICAN AMERICAN: 64 mL/min — AB (ref >90)
GFR, EST NON AFRICAN AMERICAN: 55 mL/min — AB (ref >90)
GLUCOSE: 151 mg/dL — AB (ref 70–99)
POTASSIUM: 4.9 mmol/L (ref 3.5–5.1)
Sodium: 137 mmol/L (ref 135–145)

## 2014-04-06 LAB — CBC
HEMATOCRIT: 38 % — AB (ref 39.0–52.0)
Hemoglobin: 12.5 g/dL — ABNORMAL LOW (ref 13.0–17.0)
MCH: 32.7 pg (ref 26.0–34.0)
MCHC: 32.9 g/dL (ref 30.0–36.0)
MCV: 99.5 fL (ref 78.0–100.0)
Platelets: 146 10*3/uL — ABNORMAL LOW (ref 150–400)
RBC: 3.82 MIL/uL — ABNORMAL LOW (ref 4.22–5.81)
RDW: 15.5 % (ref 11.5–15.5)
WBC: 10.3 10*3/uL (ref 4.0–10.5)

## 2014-04-06 LAB — URINE MICROSCOPIC-ADD ON

## 2014-04-06 LAB — TSH: TSH: 1.563 u[IU]/mL (ref 0.350–4.500)

## 2014-04-06 MED ORDER — ADULT MULTIVITAMIN W/MINERALS CH
1.0000 | ORAL_TABLET | Freq: Every day | ORAL | Status: DC
  Administered 2014-04-06 – 2014-04-09 (×4): 1 via ORAL
  Filled 2014-04-06 (×4): qty 1

## 2014-04-06 MED ORDER — SODIUM CHLORIDE 0.9 % IV BOLUS (SEPSIS)
1000.0000 mL | Freq: Once | INTRAVENOUS | Status: AC
  Administered 2014-04-06: 1000 mL via INTRAVENOUS

## 2014-04-06 MED ORDER — SODIUM CHLORIDE 0.9 % IV SOLN
INTRAVENOUS | Status: DC
  Administered 2014-04-06 – 2014-04-09 (×4): via INTRAVENOUS

## 2014-04-06 MED ORDER — SODIUM CHLORIDE 0.9 % IV SOLN
Freq: Once | INTRAVENOUS | Status: AC
  Administered 2014-04-06: 1000 mL via INTRAVENOUS

## 2014-04-06 MED ORDER — ONDANSETRON HCL 4 MG/2ML IJ SOLN: 4.0000 mg | Freq: Four times a day (QID) | INTRAMUSCULAR | Status: DC | PRN

## 2014-04-06 MED ORDER — MENS MULTI VITAMIN & MINERAL PO TABS: ORAL_TABLET | Freq: Every day | ORAL | Status: DC

## 2014-04-06 MED ORDER — INSULIN ASPART 100 UNIT/ML ~~LOC~~ SOLN
4.0000 [IU] | Freq: Three times a day (TID) | SUBCUTANEOUS | Status: DC
  Administered 2014-04-07 – 2014-04-09 (×5): 4 [IU] via SUBCUTANEOUS

## 2014-04-06 MED ORDER — METOPROLOL TARTRATE 25 MG PO TABS
12.5000 mg | ORAL_TABLET | Freq: Two times a day (BID) | ORAL | Status: DC
  Administered 2014-04-06 – 2014-04-09 (×6): 12.5 mg via ORAL
  Filled 2014-04-06 (×6): qty 1

## 2014-04-06 MED ORDER — INSULIN ASPART 100 UNIT/ML ~~LOC~~ SOLN
0.0000 [IU] | Freq: Three times a day (TID) | SUBCUTANEOUS | Status: DC
  Administered 2014-04-07: 2 [IU] via SUBCUTANEOUS
  Administered 2014-04-07 – 2014-04-08 (×2): 3 [IU] via SUBCUTANEOUS
  Administered 2014-04-09: 5 [IU] via SUBCUTANEOUS

## 2014-04-06 MED ORDER — LEVOTHYROXINE SODIUM 100 MCG PO TABS
200.0000 ug | ORAL_TABLET | Freq: Every day | ORAL | Status: DC
  Administered 2014-04-07 – 2014-04-09 (×3): 200 ug via ORAL
  Filled 2014-04-06 (×3): qty 2

## 2014-04-06 MED ORDER — OMEGA-3-ACID ETHYL ESTERS 1 G PO CAPS
1.0000 g | ORAL_CAPSULE | Freq: Two times a day (BID) | ORAL | Status: DC
  Administered 2014-04-06 – 2014-04-09 (×6): 1 g via ORAL
  Filled 2014-04-06 (×6): qty 1

## 2014-04-06 MED ORDER — SENNOSIDES-DOCUSATE SODIUM 8.6-50 MG PO TABS: 1.0000 | ORAL_TABLET | Freq: Every evening | ORAL | Status: DC | PRN

## 2014-04-06 MED ORDER — ASPIRIN EC 81 MG PO TBEC
81.0000 mg | DELAYED_RELEASE_TABLET | Freq: Every day | ORAL | Status: DC
  Administered 2014-04-06 – 2014-04-09 (×4): 81 mg via ORAL
  Filled 2014-04-06 (×4): qty 1

## 2014-04-06 MED ORDER — ACETAMINOPHEN 650 MG RE SUPP: 650.0000 mg | Freq: Four times a day (QID) | RECTAL | Status: DC | PRN

## 2014-04-06 MED ORDER — FUROSEMIDE 40 MG PO TABS
40.0000 mg | ORAL_TABLET | Freq: Every day | ORAL | Status: DC
  Administered 2014-04-06 – 2014-04-09 (×4): 40 mg via ORAL
  Filled 2014-04-06 (×4): qty 1

## 2014-04-06 MED ORDER — CEFTRIAXONE SODIUM IN DEXTROSE 20 MG/ML IV SOLN
1.0000 g | INTRAVENOUS | Status: DC
  Filled 2014-04-06: qty 50

## 2014-04-06 MED ORDER — INSULIN DETEMIR 100 UNIT/ML ~~LOC~~ SOLN
90.0000 [IU] | Freq: Two times a day (BID) | SUBCUTANEOUS | Status: DC
  Administered 2014-04-06 – 2014-04-08 (×3): 90 [IU] via SUBCUTANEOUS
  Filled 2014-04-06 (×6): qty 0.9

## 2014-04-06 MED ORDER — OXYCODONE HCL 5 MG PO TABS
5.0000 mg | ORAL_TABLET | ORAL | Status: DC | PRN
  Administered 2014-04-06 – 2014-04-09 (×5): 5 mg via ORAL
  Filled 2014-04-06 (×5): qty 1

## 2014-04-06 MED ORDER — ACETAMINOPHEN 325 MG PO TABS: 650.0000 mg | ORAL_TABLET | Freq: Four times a day (QID) | ORAL | Status: DC | PRN

## 2014-04-06 MED ORDER — ATORVASTATIN CALCIUM 40 MG PO TABS
40.0000 mg | ORAL_TABLET | Freq: Every day | ORAL | Status: DC
  Administered 2014-04-06 – 2014-04-07 (×2): 40 mg via ORAL
  Filled 2014-04-06 (×2): qty 1

## 2014-04-06 MED ORDER — GLIPIZIDE 5 MG PO TABS
5.0000 mg | ORAL_TABLET | Freq: Two times a day (BID) | ORAL | Status: DC
  Administered 2014-04-06 – 2014-04-09 (×5): 5 mg via ORAL
  Filled 2014-04-06 (×5): qty 1

## 2014-04-06 MED ORDER — HEPARIN SODIUM (PORCINE) 5000 UNIT/ML IJ SOLN
5000.0000 [IU] | Freq: Three times a day (TID) | INTRAMUSCULAR | Status: DC
  Administered 2014-04-06 – 2014-04-09 (×9): 5000 [IU] via SUBCUTANEOUS
  Filled 2014-04-06 (×9): qty 1

## 2014-04-06 MED ORDER — ACARBOSE 50 MG PO TABS
100.0000 mg | ORAL_TABLET | Freq: Two times a day (BID) | ORAL | Status: DC
  Administered 2014-04-06 – 2014-04-09 (×5): 100 mg via ORAL
  Filled 2014-04-06 (×8): qty 2
  Filled 2014-04-06 (×2): qty 1

## 2014-04-06 MED ORDER — CITALOPRAM HYDROBROMIDE 20 MG PO TABS
20.0000 mg | ORAL_TABLET | Freq: Every day | ORAL | Status: DC
  Administered 2014-04-06 – 2014-04-09 (×4): 20 mg via ORAL
  Filled 2014-04-06 (×4): qty 1

## 2014-04-06 MED ORDER — CEFTRIAXONE SODIUM 1 G IJ SOLR
1.0000 g | Freq: Once | INTRAMUSCULAR | Status: AC
  Administered 2014-04-06: 1 g via INTRAVENOUS
  Filled 2014-04-06: qty 10

## 2014-04-06 MED ORDER — LISINOPRIL 10 MG PO TABS
20.0000 mg | ORAL_TABLET | Freq: Every day | ORAL | Status: DC
  Administered 2014-04-06 – 2014-04-09 (×4): 20 mg via ORAL
  Filled 2014-04-06 (×4): qty 2

## 2014-04-06 MED ORDER — SODIUM CHLORIDE 0.9 % IV SOLN: INTRAVENOUS | Status: AC

## 2014-04-06 MED ORDER — ONDANSETRON HCL 4 MG PO TABS: 4.0000 mg | ORAL_TABLET | Freq: Four times a day (QID) | ORAL | Status: DC | PRN

## 2014-04-06 NOTE — ED Notes (Signed)
Pt states that he is unable to provide a UA at this time and does not want to have in and out cath done.  States he will continue to try.

## 2014-04-06 NOTE — ED Notes (Signed)
Pt states that he had sx on his right knee on Friday and last night slipped in the floor and could not get up.  States slept in the floor and then this morning crawled to the phone to call 911 because he could not get up.  Pt c/o generalized weakness.

## 2014-04-06 NOTE — ED Notes (Signed)
Pt states he still cannot give urine sample at this time.

## 2014-04-06 NOTE — ED Notes (Signed)
In and out cath attempted unsuccessfully.  Pt has retracted meatus with very tight foreskin that is not able to be retracted.  Pt states he is scheduled to go to [facility] to have this fixed in 2 months.  MD aware.

## 2014-04-06 NOTE — ED Provider Notes (Signed)
CSN: [id number]     Arrival date & time 04/06/14  2633 History  This chart was scribed for Nat Christen, MD by Stephania Fragmin, ED Scribe. This patient was seen in room APA19/APA19 and the patient's care was started at 10:13 AM.    Chief Complaint  Patient presents with  . Weakness   The history is provided by the patient. No language interpreter was used.     HPI Comments: [name] is a 69 y.o. male with a history of DM who presents to the Emergency Department complaining of constant, moderate weakness that began last night. He states that he could not get up into his bed and slept on the floor because he did not have enough energy to get up. Patient complains of associated chills and urinating more frequently. He denies any falls last night. Patient had right knee surgery 2 days ago by Dr. Arther Abbott after tearing a ligament from tripping and falling recently. He is normally able to ambulate around the house, where he lives alone. He denies any recent URI. He denies loss of appetite, cough, sore throat, fever.   Patient used to be a Arboriculturist.  PCP: Claretta Fraise, MD    Past Medical History  Diagnosis Date  . 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 BPH (benign prostatic hypertrophy) 04/15/2010  . DEPRESSION 01/22/2009  . HYPERCHOLESTEROLEMIA 01/22/2009  . 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 Morbid obesity   . Heart valve replaced   . Colon polyps   . Ulcer   . Heart murmur   . HYPERTENSION 01/22/2009    dr Casimer Lanius  . Arthritis   . Tremors of nervous system     ?ptsd   Past Surgical History  Procedure Laterality Date  . Appendectomy    . Aortic valve replacement  July 2006    #20 stentless [place] porcine valve  . Dental surgery  05/2004    Dental extractions  . Heel spur surgery Bilateral     resection of heel spur  . Colonoscopy  04/24/2007    [name]: normal  . Tonsillectomy    . Bilateral cataract surg    . Colonoscopy with esophagogastroduodenoscopy (egd)  N/A 04/05/2012    Procedure: COLONOSCOPY WITH ESOPHAGOGASTRODUODENOSCOPY (EGD);  Surgeon: Daneil Dolin, MD;  [facility];  Service: Endoscopy;  Laterality: N/A;  10;15  . Thyroidectomy  03/21/2013    DR [name]  . Thyroidectomy N/A 03/21/2013    Procedure: THYROIDECTOMY;  Surgeon: [name], MD;  Location: [facility] OR;  Service: General;  Laterality: N/A;   Family History  Problem Relation Age of Onset  . Diabetes     History  Substance Use Topics  . Smoking status: Former Smoker -- 0.50 packs/day for 12 years    Types: Cigarettes    Quit date: 09/20/1961  . Smokeless tobacco: Never Used  . Alcohol Use: No    Review of Systems  Constitutional: Positive for chills. Negative for fever and appetite change.  HENT: Negative for sore throat.   Respiratory: Negative for cough.   Endocrine: Positive for polyuria.  Genitourinary: Negative for dysuria.  Neurological: Positive for weakness.    Allergies  Phenothiazines and Pioglitazone  Home Medications   Prior to Admission medications   Medication Sig Start Date End Date Taking? Authorizing Provider  acarbose (PRECOSE) 100 MG tablet TAKE ONE TABLET BY MOUTH TWICE DAILY 03/28/14  Yes Claretta Fraise, MD  aspirin EC 81 MG tablet Take 81 mg by mouth daily.  Yes Historical Provider, MD  calcium-vitamin D (OSCAL WITH D) 500-200 MG-UNIT per tablet Take 2 tablets by mouth 2 (two) times daily. Patient taking differently: Take 1 tablet by mouth 2 (two) times daily.  03/22/13  Yes Armandina Gemma, MD  citalopram (CELEXA) 40 MG tablet Take 20 mg by mouth daily.   Yes Historical Provider, MD  furosemide (LASIX) 40 MG tablet Take 1 tablet (40 mg total) by mouth daily. 12/10/13  Yes Mary-Margaret Hassell Done, FNP  glipiZIDE (GLUCOTROL) 5 MG tablet Take 1 tablet (5 mg total) by mouth 2 (two) times daily before a meal. 02/26/14  Yes Mary-Margaret Hassell Done, FNP  HYDROcodone-acetaminophen (NORCO) 10-325 MG per tablet Take 1 tablet by mouth every 6 (six) hours  as needed. Patient taking differently: Take 1 tablet by mouth every 6 (six) hours as needed (pain).  04/04/14  Yes Carole Civil, MD  insulin detemir (LEVEMIR) 100 UNIT/ML injection 45 u BID Patient taking differently: Inject 90 Units into the skin 2 (two) times daily.  08/30/13  Yes Mary-Margaret Hassell Done, FNP  levothyroxine (SYNTHROID, LEVOTHROID) 200 MCG tablet Take 1 tablet (200 mcg total) by mouth daily before breakfast. 03/14/14  Yes Claretta Fraise, MD  lisinopril (PRINIVIL,ZESTRIL) 40 MG tablet Take 20 mg by mouth daily.   Yes Historical Provider, MD  metFORMIN (GLUCOPHAGE) 1000 MG tablet Take 1 tablet (1,000 mg total) by mouth 2 (two) times daily with a meal. 03/07/14  Yes Mary-Margaret Hassell Done, FNP  metoprolol tartrate (LOPRESSOR) 25 MG tablet Take 0.5 tablets (12.5 mg total) by mouth 2 (two) times daily. 08/20/13  Yes Mary-Margaret Hassell Done, FNP  Multiple Vitamins-Minerals (MENS MULTI VITAMIN & MINERAL PO) Take 1 tablet by mouth daily.     Yes Historical Provider, MD  Omega-3 Fatty Acids (FISH OIL) 1200 MG CAPS Take 1-2 capsules by mouth 2 (two) times daily. 1 capsule by mouth q am, 2 capsules by mouth q pm   Yes Historical Provider, MD  simvastatin (ZOCOR) 40 MG tablet Take 1 tablet (40 mg total) by mouth daily at 6 PM. 06/04/13  Yes Mary-Margaret Hassell Done, FNP  BAYER CONTOUR NEXT TEST test strip USE ONE STRIP TO CHECK GLUCOSE 4 TIMES DAILY 03/26/14   Claretta Fraise, MD  citalopram (CELEXA) 20 MG tablet Take 1 tablet (20 mg total) by mouth daily. Patient not taking: Reported on 04/06/2014 04/29/13   Vernie Shanks, MD  lisinopril (PRINIVIL,ZESTRIL) 20 MG tablet Take 1 tablet (20 mg total) by mouth daily. Patient not taking: Reported on 04/06/2014 10/04/13   Mary-Margaret Hassell Done, FNP   BP 135/60 mmHg  Pulse 85  Temp(Src) 98 F (36.7 C) (Axillary)  Resp 15  Ht 6\' 1"  (1.854 m)  Wt 315 lb (142.883 kg)  BMI 41.57 kg/m2  SpO2 98% Physical Exam  Constitutional: He is oriented to person, place, and time.  He appears well-developed and well-nourished.  Alert. Lips are parched and dry. Obese.  HENT:  Head: Normocephalic and atraumatic.  Eyes: Conjunctivae and EOM are normal. Pupils are equal, round, and reactive to light.  Neck: Normal range of motion. Neck supple.  Cardiovascular: Normal rate and regular rhythm.   Pulmonary/Chest: Effort normal and breath sounds normal.  Abdominal: Soft. Bowel sounds are normal.  Musculoskeletal: Normal range of motion.  Right knee: slightly erythematous on the anterior aspect of the knee. There are two punctures holes that are sutured.  Neurological: He is alert and oriented to person, place, and time.  Skin: Skin is warm and dry.  Psychiatric: He has a  normal mood and affect. His behavior is normal.  Nursing note and vitals reviewed.   ED Course  Procedures (including critical care time)  DIAGNOSTIC STUDIES: Oxygen Saturation is 95% on room air, normal by my interpretation.    COORDINATION OF CARE: 10:16 AM - Suspect infection or elevated blood sugar, and will run tests. Discussed treatment plan with pt at bedside which includes blood tests, UA, CXR, and IV fluids, and pt agreed to plan.   Labs Review Labs Reviewed  CBC - Abnormal; Notable for the following:    RBC 3.82 (*)    Hemoglobin 12.5 (*)    HCT 38.0 (*)    Platelets 146 (*)    All other components within normal limits  BASIC METABOLIC PANEL - Abnormal; Notable for the following:    Glucose, Bld 151 (*)    BUN 30 (*)    GFR calc non Af Amer 55 (*)    GFR calc Af Amer 64 (*)    All other components within normal limits  URINALYSIS, ROUTINE W REFLEX MICROSCOPIC - Abnormal; Notable for the following:    APPearance CLOUDY (*)    Hgb urine dipstick LARGE (*)    Ketones, ur 40 (*)    Leukocytes, UA MODERATE (*)    All other components within normal limits  URINE MICROSCOPIC-ADD ON - Abnormal; Notable for the following:    Bacteria, UA MANY (*)    All other components within normal  limits  URINE CULTURE   Imaging Review Dg Chest 1 View  04/06/2014   CLINICAL DATA:  Acute onset of severe generalized weakness, chills, urinary frequency. Postop ligament repair in the right knee 2 days ago. Current history of diabetes. Former smoker.  EXAM: CHEST  1 VIEW  COMPARISON:  03/14/2013 and earlier.  FINDINGS: Prior sternotomy. Cardiac silhouette upper normal in size to slightly enlarged for technique. Lungs clear. Bronchovascular markings normal. Pulmonary vascularity normal. No visible pleural effusions. No pneumothorax. Stable mild elevation of the right hemidiaphragm.  IMPRESSION: Borderline to mild cardiomegaly.  No acute cardiopulmonary disease.   Electronically Signed   By: Evangeline Dakin M.D.   On: 04/06/2014 11:14     EKG Interpretation   Date/Time:  Sunday April 06 2014 09:59:16 EDT Ventricular Rate:  86 PR Interval:  195 QRS Duration: 130 QT Interval:  410 QTC Calculation: 490 R Axis:   -72 Text Interpretation:  Sinus rhythm RBBB and LAFB Confirmed by Kieanna Rollo  MD,  Shayra Anton ([id number]) on 04/06/2014 11:03:06 AM      MDM   Final diagnoses:  UTI (lower urinary tract infection)   Patient complains of generalized weakness. He felt so bad last night that he was unable to get off from the floor. He did not have a syncopal spell. He is alert and oriented. Urinalysis shows evidence of infection. IV Rocephin. IV fluids. Admit.  I personally performed the services described in this documentation, which was scribed in my presence. The recorded information has been reviewed and is accurate.     Nat Christen, MD 04/06/14 [phone]

## 2014-04-06 NOTE — H&P (Signed)
[facility]          History and Physical    PCP:   Claretta Fraise, MD   Chief Complaint:  Weakness, inability to walk  HPI: Patient is a 69 year old [name] with past medical history significant for hypertension, diabetes, hyperlipidemia, morbid obesity, aortic valve replacement, hypothyroidism who presents with the above-mentioned complaints. On March 11 patient had a right knee arthroscopy with lateral meniscectomy for a diagnosis of a lateral meniscus tear of the right knee by Dr. Aline Brochure. He was discharged home. He states that he has never really gotten out of bed since. He admits to urinary frequency, however denies dysuria, cough, fever, myalgias. He was unable to get out of bed last night and in fact has a scrape over his left knee where he was pushing off against the bed trying to get up. He called the ambulance who brought him into the hospital today. In the emergency department he was found to have what appears to be a urinary tract infection. He has been referred to [name] for admission for further evaluation and management.  Allergies:   Allergies  Allergen Reactions  . Phenothiazines Anaphylaxis  . Pioglitazone Swelling      Past Medical History  Diagnosis Date  . 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 BPH (benign prostatic hypertrophy) 04/15/2010  . DEPRESSION 01/22/2009  . HYPERCHOLESTEROLEMIA 01/22/2009  . 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 Morbid obesity   . Heart valve replaced   . Colon polyps   . Ulcer   . Heart murmur   . HYPERTENSION 01/22/2009    dr Casimer Lanius  . Arthritis   . Tremors of nervous system     ?ptsd    Past Surgical History  Procedure Laterality Date  . Appendectomy    . Aortic valve replacement  July 2006    #20 stentless [place] porcine valve  . Dental surgery  05/2004    Dental extractions  . Heel spur surgery Bilateral     resection of heel spur  . Colonoscopy  04/24/2007    [name]: normal  . Tonsillectomy    . Bilateral  cataract surg    . Colonoscopy with esophagogastroduodenoscopy (egd) N/A 04/05/2012    Procedure: COLONOSCOPY WITH ESOPHAGOGASTRODUODENOSCOPY (EGD);  Surgeon: Daneil Dolin, MD;  Location: [facility];  Service: Endoscopy;  Laterality: N/A;  10;15  . Thyroidectomy  03/21/2013    DR [name]  . Thyroidectomy N/A 03/21/2013    Procedure: THYROIDECTOMY;  Surgeon: [name], MD;  Location: [place];  Service: General;  Laterality: N/A;    Prior to Admission medications   Medication Sig Start Date End Date Taking? Authorizing Provider  acarbose (PRECOSE) 100 MG tablet TAKE ONE TABLET BY MOUTH TWICE DAILY 03/28/14  Yes Claretta Fraise, MD  aspirin EC 81 MG tablet Take 81 mg by mouth daily.   Yes Historical Provider, MD  calcium-vitamin D (OSCAL WITH D) 500-200 MG-UNIT per tablet Take 2 tablets by mouth 2 (two) times daily. Patient taking differently: Take 1 tablet by mouth 2 (two) times daily.  03/22/13  Yes Armandina Gemma, MD  citalopram (CELEXA) 40 MG tablet Take 20 mg by mouth daily.   Yes Historical Provider, MD  furosemide (LASIX) 40 MG tablet Take 1 tablet (40 mg total) by mouth daily. 12/10/13  Yes Mary-Margaret Hassell Done, FNP  glipiZIDE (GLUCOTROL) 5 MG tablet Take 1 tablet (5 mg total) by mouth 2 (two) times daily before  a meal. 02/26/14  Yes Mary-Margaret Hassell Done, FNP  HYDROcodone-acetaminophen (NORCO) 10-325 MG per tablet Take 1 tablet by mouth every 6 (six) hours as needed. Patient taking differently: Take 1 tablet by mouth every 6 (six) hours as needed (pain).  04/04/14  Yes Carole Civil, MD  insulin detemir (LEVEMIR) 100 UNIT/ML injection 45 u BID Patient taking differently: Inject 90 Units into the skin 2 (two) times daily.  08/30/13  Yes Mary-Margaret Hassell Done, FNP  levothyroxine (SYNTHROID, LEVOTHROID) 200 MCG tablet Take 1 tablet (200 mcg total) by mouth daily before breakfast. 03/14/14  Yes Claretta Fraise, MD  lisinopril (PRINIVIL,ZESTRIL) 40 MG tablet Take 20 mg by mouth daily.   Yes Historical  Provider, MD  metFORMIN (GLUCOPHAGE) 1000 MG tablet Take 1 tablet (1,000 mg total) by mouth 2 (two) times daily with a meal. 03/07/14  Yes Mary-Margaret Hassell Done, FNP  metoprolol tartrate (LOPRESSOR) 25 MG tablet Take 0.5 tablets (12.5 mg total) by mouth 2 (two) times daily. 08/20/13  Yes Mary-Margaret Hassell Done, FNP  Multiple Vitamins-Minerals (MENS MULTI VITAMIN & MINERAL PO) Take 1 tablet by mouth daily.     Yes Historical Provider, MD  Omega-3 Fatty Acids (FISH OIL) 1200 MG CAPS Take 1-2 capsules by mouth 2 (two) times daily. 1 capsule by mouth q am, 2 capsules by mouth q pm   Yes Historical Provider, MD  simvastatin (ZOCOR) 40 MG tablet Take 1 tablet (40 mg total) by mouth daily at 6 PM. 06/04/13  Yes Mary-Margaret Hassell Done, FNP  BAYER CONTOUR NEXT TEST test strip USE ONE STRIP TO CHECK GLUCOSE 4 TIMES DAILY 03/26/14   Claretta Fraise, MD  citalopram (CELEXA) 20 MG tablet Take 1 tablet (20 mg total) by mouth daily. Patient not taking: Reported on 04/06/2014 04/29/13   Vernie Shanks, MD  lisinopril (PRINIVIL,ZESTRIL) 20 MG tablet Take 1 tablet (20 mg total) by mouth daily. Patient not taking: Reported on 04/06/2014 10/04/13   Mary-Margaret Hassell Done, FNP    Social History:  reports that he quit smoking about 52 years ago. His smoking use included Cigarettes. He has a 6 pack-year smoking history. He has never used smokeless tobacco. He reports that he does not drink alcohol or use illicit drugs.  Family History  Problem Relation Age of Onset  . Diabetes      Review of Systems:  Constitutional: Denies fever, chills, diaphoresis, appetite change and fatigue.  HEENT: Denies photophobia, eye pain, redness, hearing loss, ear pain, congestion, sore throat, rhinorrhea, sneezing, mouth sores, trouble swallowing, neck pain, neck stiffness and tinnitus.   Respiratory: Denies SOB, DOE, cough, chest tightness,  and wheezing.   Cardiovascular: Denies chest pain, palpitations and leg swelling.  Gastrointestinal: Denies  nausea, vomiting, abdominal pain, diarrhea, constipation, blood in stool and abdominal distention.  Genitourinary: Denies dysuria, urgency, frequency, hematuria, flank pain and difficulty urinating.  Endocrine: Denies: hot or cold intolerance, sweats, changes in hair or nails, polyuria, polydipsia. Musculoskeletal: Denies myalgias, back pain. Skin: Denies pallor, rash and wound.  Neurological: Denies dizziness, seizures, syncope, , light-headedness, numbness and headaches.  Hematological: Denies adenopathy. Easy bruising, personal or family bleeding history  Psychiatric/Behavioral: Denies suicidal ideation, mood changes, confusion, nervousness, sleep disturbance and agitation   Physical Exam: Blood pressure 134/67, pulse 91, temperature 98 F (36.7 C), temperature source Oral, resp. rate 18, height '6\' 1"'  (1.854 m), weight 138.1 kg (304 lb 7.3 oz), SpO2 97 %. General: AA Ox3, NAD, tremelous HEENT: Normocephalic, atraumatic, pupils equal round and reactive, wears corrective lenses, dry mucous membranes  with cracked lips and tongue, mild pharyngeal erythema. Neck: Supple, no JVD, no lymphadenopathy, no bruits, no goiter. Cardiovascular: Regular rate and rhythm, prominent systolic ejection murmur. Lungs: Clear to auscultation bilaterally. Abdomen: Soft, nontender, nondistended, positive bowel sounds, no masses or organomegaly noted, morbidly obese. Extremities: 1+ pitting edema bilaterally no clubbing, or cyanosis. Neurologic: Grossly intact and nonfocal other than tremors.   Labs on Admission:  Results for orders placed or performed during the hospital encounter of 04/06/14 (from the past 48 hour(s))  CBC  (at [facility])     Status: Abnormal   Collection Time: 04/06/14 11:22 AM  Result Value Ref Range   WBC 10.3 4.0 - 10.5 K/uL   RBC 3.82 (L) 4.22 - 5.81 MIL/uL   Hemoglobin 12.5 (L) 13.0 - 17.0 g/dL   HCT [name].0 (L) 39.0 - 52.0 %   MCV 99.5 78.0 - 100.0 fL   MCH 32.7 26.0 - 34.0  pg   MCHC 32.9 30.0 - 36.0 g/dL   RDW 15.5 11.5 - 15.5 %   Platelets 146 (L) 150 - 400 K/uL  Basic metabolic panel  (at [facility])     Status: Abnormal   Collection Time: 04/06/14 11:22 AM  Result Value Ref Range   Sodium 137 135 - 145 mmol/L   Potassium 4.9 3.5 - 5.1 mmol/L   Chloride 102 96 - 112 mmol/L   CO2 27 19 - 32 mmol/L   Glucose, Bld 151 (H) 70 - 99 mg/dL   BUN 30 (H) 6 - 23 mg/dL   Creatinine, Ser 1.28 0.50 - 1.35 mg/dL   Calcium 9.0 8.4 - 10.5 mg/dL   GFR calc non Af Amer 55 (L) >90 mL/min   GFR calc Af Amer 64 (L) >90 mL/min    Comment: (NOTE) The eGFR has been calculated using the CKD EPI equation. This calculation has not been validated in all clinical situations. eGFR's persistently <90 mL/min signify possible Chronic Kidney Disease.    Anion gap 8 5 - 15  Urinalysis, Routine w reflex microscopic     Status: Abnormal   Collection Time: 04/06/14  2:10 PM  Result Value Ref Range   Color, Urine YELLOW YELLOW   APPearance CLOUDY (A) CLEAR   Specific Gravity, Urine 1.020 1.005 - 1.030   pH 5.5 5.0 - 8.0   Glucose, UA NEGATIVE NEGATIVE mg/dL   Hgb urine dipstick LARGE (A) NEGATIVE   Bilirubin Urine NEGATIVE NEGATIVE   Ketones, ur 40 (A) NEGATIVE mg/dL   Protein, ur NEGATIVE NEGATIVE mg/dL   Urobilinogen, UA 0.2 0.0 - 1.0 mg/dL   Nitrite NEGATIVE NEGATIVE   Leukocytes, UA MODERATE (A) NEGATIVE  Urine microscopic-add on     Status: Abnormal   Collection Time: 04/06/14  2:10 PM  Result Value Ref Range   WBC, UA 11-20 <3 WBC/hpf   RBC / HPF 11-20 <3 RBC/hpf   Bacteria, UA MANY (A) RARE  Glucose, capillary     Status: Abnormal   Collection Time: 04/06/14  4:49 PM  Result Value Ref Range   Glucose-Capillary 230 (H) 70 - 99 mg/dL   Comment 1 Notify RN    Comment 2 Document in Chart     Radiological Exams on Admission: Dg Chest 1 View  04/06/2014   CLINICAL DATA:  Acute onset of severe generalized weakness, chills, urinary frequency. Postop ligament  repair in the right knee 2 days ago. Current history of diabetes. Former smoker.  EXAM: CHEST  1 VIEW  COMPARISON:  03/14/2013 and earlier.  FINDINGS: Prior sternotomy. Cardiac silhouette upper normal in size to slightly enlarged for technique. Lungs clear. Bronchovascular markings normal. Pulmonary vascularity normal. No visible pleural effusions. No pneumothorax. Stable mild elevation of the right hemidiaphragm.  IMPRESSION: Borderline to mild cardiomegaly.  No acute cardiopulmonary disease.   Electronically Signed   By: Evangeline Dakin M.D.   On: 04/06/2014 11:14    Assessment/Plan Principal Problem:   UTI (lower urinary tract infection) Active Problems:   Generalized weakness   Hyperlipidemia with target LDL less than 100   Essential hypertension   Aortic valve disease   Postsurgical hypothyroidism   DM type 2, not at goal   Lateral meniscal tear    Generalized weakness -Suspect related to UTI. -Check TSH, vitamin B 12. -Obtain PT evaluation. -Suspect will likely need SNF placement after discharge given inability to walk and the fact that he lives by himself.  UTI -Continue Rocephin pending culture data.  Hyperlipidemia -Continue statin and fish oil.  Hypertension -Continue home medications.  Aortic valve disease -Status post tissue aortic valve replacement.  Hypothyroidism -Continue Synthroid at home dose. -Check TSH.  Type 2 diabetes -Check hemoglobin A1c. -Placed on sliding scale insulin and hold metformin while in the hospital. -Continue glipizide and acarbose.  DVT prophylaxis -Subcutaneous heparin  CODE STATUS -Full code    Time Spent on Admission: 80 minutes  HERNANDEZ ACOSTA,ESTELA [facility] Pager: [phone] 04/06/2014, 5:22 PM

## 2014-04-07 ENCOUNTER — Ambulatory Visit: Payer: Medicare Other | Admitting: Orthopedic Surgery

## 2014-04-07 ENCOUNTER — Encounter (HOSPITAL_COMMUNITY): Payer: Self-pay | Admitting: Orthopedic Surgery

## 2014-04-07 LAB — BASIC METABOLIC PANEL
ANION GAP: 6 (ref 5–15)
BUN: 20 mg/dL (ref 6–23)
CHLORIDE: 103 mmol/L (ref 96–112)
CO2: 29 mmol/L (ref 19–32)
Calcium: 8.5 mg/dL (ref 8.4–10.5)
Creatinine, Ser: 1.06 mg/dL (ref 0.50–1.35)
GFR calc Af Amer: 80 mL/min — ABNORMAL LOW (ref >90)
GFR calc non Af Amer: 69 mL/min — ABNORMAL LOW (ref >90)
Glucose, Bld: 151 mg/dL — ABNORMAL HIGH (ref 70–99)
Potassium: 4.2 mmol/L (ref 3.5–5.1)
Sodium: 138 mmol/L (ref 135–145)

## 2014-04-07 LAB — CBC
HCT: 37.2 % — ABNORMAL LOW (ref 39.0–52.0)
Hemoglobin: 12 g/dL — ABNORMAL LOW (ref 13.0–17.0)
MCH: 32.5 pg (ref 26.0–34.0)
MCHC: 32.3 g/dL (ref 30.0–36.0)
MCV: 100.8 fL — ABNORMAL HIGH (ref 78.0–100.0)
Platelets: 157 10*3/uL (ref 150–400)
RBC: 3.69 MIL/uL — AB (ref 4.22–5.81)
RDW: 15.9 % — ABNORMAL HIGH (ref 11.5–15.5)
WBC: 8.2 10*3/uL (ref 4.0–10.5)

## 2014-04-07 LAB — VITAMIN B12: Vitamin B-12: 211 pg/mL (ref 211–911)

## 2014-04-07 LAB — GLUCOSE, CAPILLARY
GLUCOSE-CAPILLARY: 48 mg/dL — AB (ref 70–99)
Glucose-Capillary: 143 mg/dL — ABNORMAL HIGH (ref 70–99)
Glucose-Capillary: 183 mg/dL — ABNORMAL HIGH (ref 70–99)
Glucose-Capillary: 104 mg/dL — ABNORMAL HIGH (ref 70–99)
Glucose-Capillary: 109 mg/dL — ABNORMAL HIGH (ref 70–99)

## 2014-04-07 LAB — URINE CULTURE
Colony Count: NO GROWTH
Culture: NO GROWTH

## 2014-04-07 MED ORDER — DEXTROSE 5 % IV SOLN
1.0000 g | INTRAVENOUS | Status: DC
  Administered 2014-04-07 – 2014-04-08 (×2): 1 g via INTRAVENOUS
  Filled 2014-04-07 (×3): qty 10

## 2014-04-07 MED ORDER — INSULIN DETEMIR 100 UNIT/ML ~~LOC~~ SOLN
45.0000 [IU] | Freq: Once | SUBCUTANEOUS | Status: AC
  Administered 2014-04-07: 45 [IU] via SUBCUTANEOUS
  Filled 2014-04-07: qty 0.45

## 2014-04-07 NOTE — Clinical Social Work Placement (Signed)
Clinical Social Work Department CLINICAL SOCIAL WORK PLACEMENT NOTE 04/07/2014  Patient:  [name], [name]  Account Number:  [id number] Admit date:  04/06/2014  Clinical Social Worker:  Benay Pike, LCSW  Date/time:  04/07/2014 02:47 PM  Clinical Social Work is seeking post-discharge placement for this patient at the following level of care:   [place]   (*CSW will update this form in Epic as items are completed)   04/07/2014  Patient/family provided with [facility] Department of Clinical Social Work's list of facilities offering this level of care within the geographic area requested by the patient (or if unable, by the patient's family).  04/07/2014  Patient/family informed of their freedom to choose among providers that offer the needed level of care, that participate in Medicare, Medicaid or managed care program needed by the patient, have an available bed and are willing to accept the patient.  04/07/2014  Patient/family informed of [facility]' ownership interest in [facility], as well as of the fact that they are under no obligation to receive care at this facility.  PASARR submitted to EDS on 04/07/2014 PASARR number received on 04/07/2014  FL2 transmitted to all facilities in geographic area requested by pt/family on  04/07/2014 FL2 transmitted to all facilities within larger geographic area on   Patient informed that his/her managed care company has contracts with or will negotiate with  certain facilities, including the following:     Patient/family informed of bed offers received:   Patient chooses bed at  Physician recommends and patient chooses bed at    Patient to be transferred to  on   Patient to be transferred to facility by  Patient and family notified of transfer on  Name of family member notified:    The following physician request were entered in Epic:   Additional Comments:  Benay Pike, [place]

## 2014-04-07 NOTE — Care Management Note (Addendum)
    Page 1 of 1   04/09/2014     2:36:27 PM CARE MANAGEMENT NOTE 04/09/2014  Patient:  [name], [name]   Account Number:  [id number]  Date Initiated:  04/07/2014  Documentation initiated by:  Jolene Provost  Subjective/Objective Assessment:   Pt admitted from home with UTI. Pt recently had knee surgery. Pt from home alone and independent with ADL's prior to surgery with no HH services or DME's. Pt uses [facility] and the [facility]. Pt has recommended SNF at discharge.     Action/Plan:   [facility] notified of admission and refusal to transfer form has been faxed back. Spoke with Scientist, research (life sciences) Vaughan Basta) at [facility]. Pt agreabel to SNF at discharge. CSW is aware and will arrange for placement. No CM needs identified.   Anticipated DC Date:  04/09/2014   Anticipated DC Plan:  SKILLED NURSING FACILITY  In-house referral  Clinical Social Worker      DC Planning Services  CM consult      Choice offered to / List presented to:             Status of service:  Completed, signed off Medicare Important Message given?  YES (If response is "NO", the following Medicare IM given date fields will be blank) Date Medicare IM given:  04/09/2014 Medicare IM given by:  Jolene Provost Date Additional Medicare IM given:   Additional Medicare IM given by:    Discharge Disposition:  [place]  Per UR Regulation:  Reviewed for med. necessity/level of care/duration of stay  If discussed at [place] of Stay Meetings, dates discussed:    Comments:  04/09/2014 [place], RN, MSN, CM Pt discharged to SNF.  04/07/2014 [place], RN, MSN, CM

## 2014-04-07 NOTE — Progress Notes (Signed)
[facility] PROGRESS NOTE  [name] HWE:[id number] DOB: 09/23/1945 DOA: 04/06/2014 PCP: Claretta Fraise, MD  Assessment/Plan: Generalized Weakness -Suspect related to UTI. -TSH WNL. -Check B12. -PT has recommended SNF.  UTI -Continue rocephin pending cx data.  Hyperlipidemia -Continue statin and fish oil.  HTN -Fair control. -Continue home meds.  Aortic Valve Disease -s/p tissue AVR.  Hypothyroidism -TSH WNL at 1.563. -Continue home dose of synthroid.  DM II -Fair control. -Continue current regimen.  Recent Right Knee Surgery -Follow up with Dr. Aline Brochure as scheduled.  Code Status: Full Code Family Communication: Patient only  Disposition Plan: To SNF once medically ready.   Consultants:  None   Antibiotics:  Rocephin   Subjective: No complaints.  Objective: Filed Vitals:   04/06/14 1619 04/06/14 2100 04/07/14 0631 04/07/14 1526  BP: 134/67 172/72 158/65 141/61  Pulse: 91 97 81 80  Temp: 98 F (36.7 C) 98.9 F (37.2 C) 98.5 F (36.9 C) 98.6 F (37 C)  TempSrc:  Oral Oral Oral  Resp: 18 18 18 20   Height: 6\' 1"  (1.854 m)     Weight: 138.1 kg (304 lb 7.3 oz)     SpO2: 97% 99% 98% 96%    Intake/Output Summary (Last 24 hours) at 04/07/14 1622 Last data filed at 04/07/14 1300  Gross per 24 hour  Intake    720 ml  Output    350 ml  Net    370 ml   Filed Weights   04/06/14 1002 04/06/14 1619  Weight: 142.883 kg (315 lb) 138.1 kg (304 lb 7.3 oz)    Exam:   General:  AA Ox3  Cardiovascular: RRR, SEM.  Respiratory: CTA B  Abdomen: S/NT/ND/+BS  Extremities: 1+ edema bilaterrly   Neurologic:  Non-focal, tremulous.  Data Reviewed: Basic Metabolic Panel:  Recent Labs Lab 04/04/14 0647 04/06/14 1122 04/07/14 0655  NA 139 137 138  K 4.8 4.9 4.2  CL  --  102 103  CO2  --  27 29  GLUCOSE 119* 151* 151*  BUN  --  30* 20  CREATININE  --  1.28 1.06  CALCIUM  --  9.0 8.5   Liver Function Tests: No results for  input(s): AST, ALT, ALKPHOS, BILITOT, PROT, ALBUMIN in the last 168 hours. No results for input(s): LIPASE, AMYLASE in the last 168 hours. No results for input(s): AMMONIA in the last 168 hours. CBC:  Recent Labs Lab 04/04/14 0647 04/06/14 1122 04/07/14 0655  WBC  --  10.3 8.2  HGB 14.3 12.5* 12.0*  HCT 42.0 38.0* 37.2*  MCV  --  99.5 100.8*  PLT  --  146* 157   Cardiac Enzymes: No results for input(s): CKTOTAL, CKMB, CKMBINDEX, TROPONINI in the last 168 hours. BNP (last 3 results) No results for input(s): BNP in the last 8760 hours.  ProBNP (last 3 results) No results for input(s): PROBNP in the last 8760 hours.  CBG:  Recent Labs Lab 04/04/14 0858 04/06/14 1649 04/06/14 2127 04/07/14 0735 04/07/14 1217  GLUCAP 147* 230* 229* 143* 183*    Recent Results (from the past 240 hour(s))  Urine culture     Status: None (Preliminary result)   Collection Time: 04/06/14  5:48 PM  Result Value Ref Range Status   Specimen Description URINE, CLEAN CATCH  Final   Special Requests NONE  Final   Colony Count PENDING  Incomplete   Culture PENDING  Incomplete   Report Status PENDING  Incomplete     Studies: Dg  Chest 1 View  04/06/2014   CLINICAL DATA:  Acute onset of severe generalized weakness, chills, urinary frequency. Postop ligament repair in the right knee 2 days ago. Current history of diabetes. Former smoker.  EXAM: CHEST  1 VIEW  COMPARISON:  03/14/2013 and earlier.  FINDINGS: Prior sternotomy. Cardiac silhouette upper normal in size to slightly enlarged for technique. Lungs clear. Bronchovascular markings normal. Pulmonary vascularity normal. No visible pleural effusions. No pneumothorax. Stable mild elevation of the right hemidiaphragm.  IMPRESSION: Borderline to mild cardiomegaly.  No acute cardiopulmonary disease.   Electronically Signed   By: Evangeline Dakin M.D.   On: 04/06/2014 11:14    Scheduled Meds: . acarbose  100 mg Oral BID WC  . aspirin EC  81 mg Oral  Daily  . atorvastatin  40 mg Oral q1800  . cefTRIAXone (ROCEPHIN)  IV  1 g Intravenous Q24H  . citalopram  20 mg Oral Daily  . furosemide  40 mg Oral Daily  . glipiZIDE  5 mg Oral BID AC  . heparin  5,000 Units Subcutaneous 3 times per day  . insulin aspart  0-15 Units Subcutaneous TID WC  . insulin aspart  4 Units Subcutaneous TID WC  . insulin detemir  90 Units Subcutaneous BID  . levothyroxine  200 mcg Oral QAC breakfast  . lisinopril  20 mg Oral Daily  . metoprolol tartrate  12.5 mg Oral BID  . multivitamin with minerals  1 tablet Oral Daily  . omega-3 acid ethyl esters  1 g Oral BID   Continuous Infusions: . sodium chloride 75 mL/hr at 04/07/14 0737    Principal Problem:   UTI (lower urinary tract infection) Active Problems:   Generalized weakness   Hyperlipidemia with target LDL less than 100   Essential hypertension   Aortic valve disease   Postsurgical hypothyroidism   DM type 2, not at goal   Lateral meniscal tear    Time spent: 25 minutes. Greater than 50% of this time was spent in direct contact with the patient coordinating care.    Lelon Frohlich  [facility] Pager [phone]  If 7PM-7AM, please contact night-coverage at [email or web address], password [facility] 04/07/2014, 4:22 PM  LOS: 1 day

## 2014-04-07 NOTE — Evaluation (Signed)
Physical Therapy Evaluation Patient Details Name: [name] MRN: [id number] DOB: 1945-08-27 Today's Date: 04/07/2014   History of Present Illness  Patient is a 69 year old man with past medical history significant for hypertension, diabetes, hyperlipidemia, morbid obesity, aortic valve replacement, hypothyroidism who presents with the above-mentioned complaints. On March 11 patient had a right knee arthroscopy with lateral meniscectomy for a diagnosis of a lateral meniscus tear of the right knee by Dr. Aline Brochure. He was discharged home. He states that he has never really gotten out of bed since. He admits to urinary frequency, however denies dysuria, cough, fever, myalgias. He was unable to get out of bed last night and in fact has a scrape over his left knee where he was pushing off against the bed trying to get up. He called the ambulance who brought him into the hospital today. In the emergency department he was found to have what appears to be a urinary tract infection. He has been referred to [name] for admission for further evaluation and management.  Clinical Impression  Pt is a very pleasant gentleman who lives alone and normally independent with all ADLs.  His right knee has an Ace wrap present and there is a foam dressing over the left patella secondary to falling out of the bed when he could not stand on his feet due to weakness.  He continues to have generalized weakness and has mild bilateral knee flexion contractures of about 10 degrees.  He required mod to max assist with transfers today and was only able to ambulate about 5' with a walker.  He should regain prior functional level and will need SNF to achieve this.  Pt is agreeable.    Follow Up Recommendations SNF    Equipment Recommendations  Rolling walker with 5" wheels    Recommendations for Other Services   OT    Precautions / Restrictions Precautions Precautions: Fall Restrictions Weight Bearing Restrictions: No       Mobility  Bed Mobility Overal bed mobility: Needs Assistance Bed Mobility: Supine to Sit     Supine to sit: HOB elevated;Mod assist     General bed mobility comments: needed assist to move both LEs to EOB as well as to move trunk on the bed  Transfers Overall transfer level: Needs assistance Equipment used: Rolling walker (2 wheeled) Transfers: Sit to/from Stand Sit to Stand: Max assist;From elevated surface         General transfer comment: extreme difficulty standing from sitting  Ambulation/Gait Ambulation/Gait assistance: Min guard Ambulation Distance (Feet): 5 Feet Assistive device: Rolling walker (2 wheeled) Gait Pattern/deviations: Shuffle   Gait velocity interpretation: Below normal speed for age/gender    Stairs            Wheelchair Mobility    Modified Rankin (Stroke Patients Only)       Balance Overall balance assessment: No apparent balance deficits (not formally assessed)                                           Pertinent Vitals/Pain Pain Assessment: No/denies pain    Home Living Family/patient expects to be discharged to:: Skilled nursing facility                      Prior Function Level of Independence: Independent  Hand Dominance        Extremity/Trunk Assessment   Upper Extremity Assessment: Defer to OT evaluation           Lower Extremity Assessment: Generalized weakness (lacks about 10 degrees of knee extension bilaterally)         Communication   Communication: No difficulties  Cognition Arousal/Alertness: Awake/alert Behavior During Therapy: WFL for tasks assessed/performed Overall Cognitive Status: Within Functional Limits for tasks assessed                      General Comments      Exercises General Exercises - Lower Extremity Quad Sets: AROM;Both;5 reps;Supine Heel Slides: AAROM;Both;5 reps;Supine      Assessment/Plan    PT Assessment  Patient needs continued PT services  PT Diagnosis Difficulty walking;Generalized weakness   PT Problem List Decreased strength;Decreased range of motion;Decreased activity tolerance;Decreased mobility;Decreased knowledge of use of DME;Obesity  PT Treatment Interventions Gait training;Functional mobility training;Therapeutic exercise   PT Goals (Current goals can be found in the Care Plan section) Acute Rehab PT Goals Patient Stated Goal: none stated PT Goal Formulation: With patient Time For Goal Achievement: 04/07/14 Potential to Achieve Goals: Good    Frequency Min 3X/week   Barriers to discharge Decreased caregiver support;Inaccessible home environment 4 steps to enter home    Co-evaluation               End of Session Equipment Utilized During Treatment: Gait belt Activity Tolerance: Patient limited by fatigue Patient left: in chair;with call bell/phone within reach           Time: 1010-1037 PT Time Calculation (min) (ACUTE ONLY): 27 min   Charges:   PT Evaluation $Initial PT Evaluation Tier I: 1 Procedure     PT G CodesDemetrios Isaacs L 04/07/2014, 10:45 AM

## 2014-04-07 NOTE — Progress Notes (Signed)
Doreena, NT notified Nurse of CBG 45. Pt sweating, but is A&O. Gave pt Juice and Graham crackers. Will re-check blood glucose in 30 minutes to re-assess. Dr Hilbert Bible notified. Will continue to monitor pt throughout night. Pt laying in bed at lowest position and call bell is within reach.

## 2014-04-07 NOTE — Care Management Utilization Note (Signed)
UR completed 

## 2014-04-07 NOTE — Clinical Social Work Psychosocial (Signed)
Clinical Social Work Department BRIEF PSYCHOSOCIAL ASSESSMENT 04/07/2014  Patient:  [name], [name]     Account Number:  [id number]     Admit date:  04/06/2014  Clinical Social Worker:  Wyatt Haste  Date/Time:  04/07/2014 02:50 PM  Referred by:  CSW  Date Referred:  04/07/2014 Referred for  SNF Placement   Other Referral:   Interview type:  Patient Other interview type:    PSYCHOSOCIAL DATA Living Status:  ALONE Admitted from facility:   Level of care:   Primary support name:  Inez Catalina Primary support relationship to patient:  FRIEND Degree of support available:   adequate    CURRENT CONCERNS Current Concerns  Post-Acute Placement   Other Concerns:    SOCIAL WORK ASSESSMENT / PLAN CSW met with pt at bedside. Pt alert and oriented and reports he lives alone. He had outpatient knee surgery Friday. Pt states that he was very weak and fell this weekend. He was able to scoot on his back to get to the phone. Pt has a brother, [name], but does not describe this as a close relationship. He feels his best support is a friend, Inez Catalina. At baseline, pt is usually independent and still drives. Admitted due to UTI. PT evaluated pt today and recommendation is for SNF. CSW discussed placement process, including Medicare coverage/criteria. Pt appeared to be very open to idea if he could go to [facility] as he knows several people there. CSW agreed to send referral to [facility].   Assessment/plan status:  Psychosocial Support/Ongoing Assessment of Needs Other assessment/ plan:   Information/referral to community resources:   SNF list    PATIENT'S/FAMILY'S RESPONSE TO PLAN OF CARE: Pt agrees that he will require SNF prior to return home as he was unable to stand ealier without several person assist. CSW will follow up.       Benay Pike, [place]

## 2014-04-08 DIAGNOSIS — E538 Deficiency of other specified B group vitamins: Secondary | ICD-10-CM

## 2014-04-08 LAB — URINE CULTURE

## 2014-04-08 LAB — GLUCOSE, CAPILLARY
GLUCOSE-CAPILLARY: 175 mg/dL — AB (ref 70–99)
GLUCOSE-CAPILLARY: 95 mg/dL (ref 70–99)
Glucose-Capillary: 75 mg/dL (ref 70–99)
Glucose-Capillary: 59 mg/dL — ABNORMAL LOW (ref 70–99)
Glucose-Capillary: 57 mg/dL — ABNORMAL LOW (ref 70–99)
Glucose-Capillary: 178 mg/dL — ABNORMAL HIGH (ref 70–99)

## 2014-04-08 LAB — VITAMIN B12: VITAMIN B 12: 236 pg/mL (ref 211–911)

## 2014-04-08 MED ORDER — INSULIN DETEMIR 100 UNIT/ML ~~LOC~~ SOLN
45.0000 [IU] | Freq: Two times a day (BID) | SUBCUTANEOUS | Status: DC
  Administered 2014-04-08 – 2014-04-09 (×2): 45 [IU] via SUBCUTANEOUS
  Filled 2014-04-08 (×4): qty 0.45

## 2014-04-08 MED ORDER — CYANOCOBALAMIN 1000 MCG/ML IJ SOLN
1000.0000 ug | Freq: Every day | INTRAMUSCULAR | Status: DC
  Administered 2014-04-08 – 2014-04-09 (×2): 1000 ug via INTRAMUSCULAR
  Filled 2014-04-08 (×2): qty 1

## 2014-04-08 NOTE — Clinical Social Work Placement (Signed)
Clinical Social Work Department CLINICAL SOCIAL WORK PLACEMENT NOTE 04/08/2014  Patient:  FREDRIC, SLABACH  Account Number:  [id number] Admit date:  04/06/2014  Clinical Social Worker:  Benay Pike, LCSW  Date/time:  04/07/2014 02:47 PM  Clinical Social Work is seeking post-discharge placement for this patient at the following level of care:   [place]   (*CSW will update this form in Epic as items are completed)   04/07/2014  Patient/family provided with [place] Department of Clinical Social Work's list of facilities offering this level of care within the geographic area requested by the patient (or if unable, by the patient's family).  04/07/2014  Patient/family informed of their freedom to choose among providers that offer the needed level of care, that participate in Medicare, Medicaid or managed care program needed by the patient, have an available bed and are willing to accept the patient.  04/07/2014  Patient/family informed of [facility]' ownership interest in [facility], as well as of the fact that they are under no obligation to receive care at this facility.  PASARR submitted to EDS on 04/07/2014 PASARR number received on 04/07/2014  FL2 transmitted to all facilities in geographic area requested by pt/family on  04/07/2014 FL2 transmitted to all facilities within larger geographic area on   Patient informed that his/her managed care company has contracts with or will negotiate with  certain facilities, including the following:     Patient/family informed of bed offers received:  04/08/2014 Patient chooses bed at [facility] Physician recommends and patient chooses bed at    Patient to be transferred to  on   Patient to be transferred to facility by  Patient and family notified of transfer on  Name of family member notified:    The following physician request were entered in Epic:   Additional Comments:  Benay Pike,  [place]

## 2014-04-08 NOTE — Progress Notes (Addendum)
[facility] PROGRESS NOTE  WIN GUAJARDO MHD:[id number] DOB: [birth year] DOA: 04/06/2014 PCP: Claretta Fraise, MD  Assessment/Plan: Generalized Weakness -Suspect related to UTI. -TSH WNL. -B12 low at 211. -PT has recommended SNF.  B12 deficiency -May explain some of his weakness. -B12 injections daily.  UTI -Continue rocephin pending cx data.  Hyperlipidemia -Continue statin and fish oil.  HTN -Fair control. -Continue home meds.  Aortic Valve Disease -s/p tissue AVR.  Hypothyroidism -TSH WNL at 1.563. -Continue home dose of synthroid.  DM II -Has been hypoglycemic today. -Will decrease levemir from 90 BId to 45 units BID.  Recent Right Knee Surgery -Follow up with Dr. Aline Brochure as scheduled.  Code Status: Full Code Family Communication: Patient only  Disposition Plan: To SNF once medically ready.   Consultants:  None   Antibiotics:  Rocephin   Subjective: No complaints. Feels a little stronger today.  Objective: Filed Vitals:   04/07/14 2209 04/08/14 0637 04/08/14 0949 04/08/14 1437  BP: 141/64 160/77 133/46 118/61  Pulse: 80 84  74  Temp: 98.1 F (36.7 C) 98.4 F (36.9 C)  98.2 F (36.8 C)  TempSrc: Oral Oral  Oral  Resp: 20 18  18   Height:      Weight:      SpO2: 98% 97%  95%    Intake/Output Summary (Last 24 hours) at 04/08/14 1641 Last data filed at 04/08/14 1355  Gross per 24 hour  Intake   1795 ml  Output   2500 ml  Net   -705 ml   Filed Weights   04/06/14 1002 04/06/14 1619  Weight: 142.883 kg (315 lb) 138.1 kg (304 lb 7.3 oz)    Exam:   General:  AA Ox3  Cardiovascular: RRR, SEM.  Respiratory: CTA B  Abdomen: S/NT/ND/+BS  Extremities: 1+ edema bilaterrly   Neurologic:  Non-focal, tremulous.  Data Reviewed: Basic Metabolic Panel:  Recent Labs Lab 04/04/14 0647 04/06/14 1122 04/07/14 0655  NA 139 137 138  K 4.8 4.9 4.2  CL  --  102 103  CO2  --  27 29  GLUCOSE 119* 151* 151*  BUN  --  30*  20  CREATININE  --  1.28 1.06  CALCIUM  --  9.0 8.5   Liver Function Tests: No results for input(s): AST, ALT, ALKPHOS, BILITOT, PROT, ALBUMIN in the last 168 hours. No results for input(s): LIPASE, AMYLASE in the last 168 hours. No results for input(s): AMMONIA in the last 168 hours. CBC:  Recent Labs Lab 04/04/14 0647 04/06/14 1122 04/07/14 0655  WBC  --  10.3 8.2  HGB 14.3 12.5* 12.0*  HCT 42.0 38.0* 37.2*  MCV  --  99.5 100.8*  PLT  --  146* 157   Cardiac Enzymes: No results for input(s): CKTOTAL, CKMB, CKMBINDEX, TROPONINI in the last 168 hours. BNP (last 3 results) No results for input(s): BNP in the last 8760 hours.  ProBNP (last 3 results) No results for input(s): PROBNP in the last 8760 hours.  CBG:  Recent Labs Lab 04/07/14 1658 04/07/14 2105 04/07/14 2200 04/08/14 0753 04/08/14 1208  GLUCAP 104* 48* 109* 75 175*    Recent Results (from the past 240 hour(s))  Urine culture     Status: None   Collection Time: 04/06/14  2:11 PM  Result Value Ref Range Status   Specimen Description URINE, CLEAN CATCH  Final   Special Requests Immunocompromised  Final   Colony Count NO GROWTH Performed at [facility]   Final  Culture NO GROWTH Performed at [facility]   Final   Report Status 04/07/2014 FINAL  Final  Urine culture     Status: None   Collection Time: 04/06/14  5:48 PM  Result Value Ref Range Status   Specimen Description URINE, CLEAN CATCH  Final   Special Requests NONE  Final   Colony Count   Final    3,000 COLONIES/ML Performed at [facility]    Culture   Final    INSIGNIFICANT GROWTH Performed at [facility]    Report Status 04/08/2014 FINAL  Final     Studies: No results found.  Scheduled Meds: . acarbose  100 mg Oral BID WC  . aspirin EC  81 mg Oral Daily  . atorvastatin  40 mg Oral q1800  . cefTRIAXone (ROCEPHIN)  IV  1 g Intravenous Q24H  . citalopram  20 mg Oral Daily  . cyanocobalamin   1,000 mcg Intramuscular Daily  . furosemide  40 mg Oral Daily  . glipiZIDE  5 mg Oral BID AC  . heparin  5,000 Units Subcutaneous 3 times per day  . insulin aspart  0-15 Units Subcutaneous TID WC  . insulin aspart  4 Units Subcutaneous TID WC  . insulin detemir  90 Units Subcutaneous BID  . levothyroxine  200 mcg Oral QAC breakfast  . lisinopril  20 mg Oral Daily  . metoprolol tartrate  12.5 mg Oral BID  . multivitamin with minerals  1 tablet Oral Daily  . omega-3 acid ethyl esters  1 g Oral BID   Continuous Infusions: . sodium chloride 75 mL/hr at 04/08/14 1130    Principal Problem:   UTI (lower urinary tract infection) Active Problems:   Generalized weakness   Hyperlipidemia with target LDL less than 100   Essential hypertension   Aortic valve disease   Postsurgical hypothyroidism   DM type 2, not at goal   Lateral meniscal tear   B12 deficiency    Time spent: 25 minutes. Greater than 50% of this time was spent in direct contact with the patient coordinating care.    Lelon Frohlich  [facility] Pager [phone]  If 7PM-7AM, please contact night-coverage at [email or web address], password [facility] 04/08/2014, 4:41 PM  LOS: 2 days

## 2014-04-08 NOTE — Progress Notes (Signed)
Hypoglycemic Event  CBG: 57  Treatment: 1 Cup of Apple Juice, @ [employer] Crackers with Peanut butter.   Symptoms: None  Follow-up CBG: Time: 1720 CBG Result:95  Possible Reasons for Event: Unknown  Comments/MD notified: yes     Efrat Zuidema  Remember to initiate Hypoglycemia Order Set & complete

## 2014-04-08 NOTE — Progress Notes (Signed)
Physical Therapy Treatment Patient Details Name: [name] MRN: [id number] DOB: April 14, 1945 Today's Date: 04/08/2014    History of Present Illness Patient is a 69 year old man with past medical history significant for hypertension, diabetes, hyperlipidemia, morbid obesity, aortic valve replacement, hypothyroidism who presents with the above-mentioned complaints. On March 11 patient had a right knee arthroscopy with lateral meniscectomy for a diagnosis of a lateral meniscus tear of the right knee by Dr. Aline Brochure. He was discharged home. He states that he has never really gotten out of bed since. He admits to urinary frequency, however denies dysuria, cough, fever, myalgias. He was unable to get out of bed last night and in fact has a scrape over his left knee where he was pushing off against the bed trying to get up. He called the ambulance who brought him into the hospital today. In the emergency department he was found to have what appears to be a urinary tract infection. He has been referred to [name] for admission for further evaluation and management.    PT Comments    Pt is much more alert and cheerful today, reports feeling much better.  He demonstrates good strength with active exercise in the bed but still has significant difficulty transferring from sit to stand and is only able to ambulate 20' with a walker before total fatigue.  His right knee is healing well but he does have a mild knee flexion contracture remaining.  He is still appropriate to transfer to SNF at discharge.  Follow Up Recommendations  SNF     Equipment Recommendations  Rolling walker with 5" wheels    Recommendations for Other Services  none     Precautions / Restrictions Precautions Precautions: Fall Restrictions Weight Bearing Restrictions: No    Mobility  Bed Mobility Overal bed mobility: Needs Assistance Bed Mobility: Supine to Sit     Supine to sit: HOB elevated;Min guard     General bed  mobility comments: just verbal cues needed for bed mobility  Transfers Overall transfer level: Needs assistance Equipment used: Rolling walker (2 wheeled) Transfers: Sit to/from Stand Sit to Stand: Min assist;From elevated surface         General transfer comment: pt rocks back and forth in order to assist standing  Ambulation/Gait Ambulation/Gait assistance: Min assist Ambulation Distance (Feet): 20 Feet Assistive device: Rolling walker (2 wheeled) Gait Pattern/deviations: Trunk flexed;Shuffle   Gait velocity interpretation: Below normal speed for age/gender General Gait Details: trunk is hyperflexed over the walker and pt is unable to stand fully erect   Stairs            Wheelchair Mobility    Modified Rankin (Stroke Patients Only)       Balance Overall balance assessment: Needs assistance Sitting-balance support: No upper extremity supported;Feet supported Sitting balance-Leahy Scale: Good     Standing balance support: No upper extremity supported Standing balance-Leahy Scale: Fair Standing balance comment: pt continues to need a walker to maintain stance                    Cognition Arousal/Alertness: Awake/alert Behavior During Therapy: WFL for tasks assessed/performed Overall Cognitive Status: Within Functional Limits for tasks assessed                      Exercises General Exercises - Lower Extremity Ankle Circles/Pumps: AROM;Both;10 reps;Supine Quad Sets: AROM;Both;10 reps;Supine Gluteal Sets: AROM;Both;10 reps;Supine Short Arc Quad: AROM;Both;10 reps;Supine Heel Slides: AROM;Both;10 reps;Supine Hip ABduction/ADduction: AROM;Both;10 reps;Supine  General Comments        Pertinent Vitals/Pain Pain Assessment: 0-10 Pain Score: 5  Pain Location: knees Pain Descriptors / Indicators: Aching Pain Intervention(s): RN gave pain meds during session    Home Living                      Prior Function             PT Goals (current goals can now be found in the care plan section) Progress towards PT goals: Progressing toward goals    Frequency  Min 3X/week    PT Plan Current plan remains appropriate    Co-evaluation             End of Session Equipment Utilized During Treatment: Gait belt Activity Tolerance: Patient tolerated treatment well Patient left: in chair (pt refused to sit next to the call bell...he was given a phone and numbers to call for assist...aide is aware and will check on him)     Time: 8341-9622 PT Time Calculation (min) (ACUTE ONLY): 34 min  Charges:  $Gait Training: 8-22 mins $Therapeutic Exercise: 8-22 mins                    G Codes:      Sable Feil 05-03-2014, 12:16 PM

## 2014-04-08 NOTE — Progress Notes (Signed)
Inpatient Diabetes Program Recommendations  AACE/ADA: New Consensus Statement on Inpatient Glycemic Control (2013)  Target Ranges:  Prepandial:   less than 140 mg/dL      Peak postprandial:   less than 180 mg/dL (1-2 hours)      Critically ill patients:  140 - 180 mg/dL   Results for [name], [name] (MRN [id number]) as of 04/08/2014 09:11  Ref. Range 04/07/2014 07:35 04/07/2014 12:17 04/07/2014 16:58 04/07/2014 21:05 04/07/2014 22:00 04/08/2014 07:53  Glucose-Capillary Latest Range: 70-99 mg/dL 143 (H) 183 (H) 104 (H) 48 (L) 109 (H) 75   Diabetes history: DM2 Outpatient Diabetes medications: Levemir 90 units BID, Glipizide 5 mg BID, Preccose 100 mg BID Current orders for Inpatient glycemic control: Levemir 90 units BID, Novolog 0-15 units TID with meals, Novolog 4 units TID with meals for meal coverage, Precose 100 mg BID, Glipizide 5 mg BID  Inpatient Diabetes Program Recommendations Insulin - Basal: Noted patient received Levemir 45 units last night due to CBG down to 48 mg/dl at 21:05. Fasting glucose is 75 mg/dl this morning. Currently ordered Levemir 90 units BID. Please consider decreasing Levemir to at least 65 units BID.  Thanks, Barnie Alderman, RN, MSN, CCRN, CDE Diabetes Coordinator Inpatient Diabetes Program [phone] (Team Pager) [phone] ([facility] office) [phone] [facility] office)

## 2014-04-08 NOTE — Progress Notes (Signed)
Hypoglycemic Event  CBG: 59  Treatment: 15 GM carbohydrate snack  Symptoms: Shaky  Follow-up CBG: [id number] CBG Result:57  Possible Reasons for Event: Unknown  Comments/MD notified:yes    [name]  Remember to initiate Hypoglycemia Order Set & complete

## 2014-04-08 NOTE — Clinical Social Work Note (Signed)
Pt accepts bed offer at [facility]. Facility notified. Awaiting stability for d/c.   Benay Pike, [place]

## 2014-04-09 ENCOUNTER — Non-Acute Institutional Stay (SKILLED_NURSING_FACILITY): Payer: Medicare Other | Admitting: Internal Medicine

## 2014-04-09 DIAGNOSIS — E119 Type 2 diabetes mellitus without complications: Secondary | ICD-10-CM | POA: Diagnosis not present

## 2014-04-09 DIAGNOSIS — R262 Difficulty in walking, not elsewhere classified: Secondary | ICD-10-CM | POA: Diagnosis not present

## 2014-04-09 DIAGNOSIS — E039 Hypothyroidism, unspecified: Secondary | ICD-10-CM | POA: Diagnosis not present

## 2014-04-09 DIAGNOSIS — R41841 Cognitive communication deficit: Secondary | ICD-10-CM | POA: Diagnosis not present

## 2014-04-09 DIAGNOSIS — E538 Deficiency of other specified B group vitamins: Secondary | ICD-10-CM

## 2014-04-09 DIAGNOSIS — I10 Essential (primary) hypertension: Secondary | ICD-10-CM | POA: Diagnosis not present

## 2014-04-09 DIAGNOSIS — R358 Other polyuria: Secondary | ICD-10-CM | POA: Diagnosis not present

## 2014-04-09 DIAGNOSIS — E1142 Type 2 diabetes mellitus with diabetic polyneuropathy: Secondary | ICD-10-CM

## 2014-04-09 DIAGNOSIS — R269 Unspecified abnormalities of gait and mobility: Secondary | ICD-10-CM

## 2014-04-09 DIAGNOSIS — F329 Major depressive disorder, single episode, unspecified: Secondary | ICD-10-CM | POA: Diagnosis not present

## 2014-04-09 DIAGNOSIS — M6281 Muscle weakness (generalized): Secondary | ICD-10-CM | POA: Diagnosis not present

## 2014-04-09 DIAGNOSIS — I359 Nonrheumatic aortic valve disorder, unspecified: Secondary | ICD-10-CM | POA: Diagnosis not present

## 2014-04-09 DIAGNOSIS — Z954 Presence of other heart-valve replacement: Secondary | ICD-10-CM | POA: Diagnosis not present

## 2014-04-09 DIAGNOSIS — E780 Pure hypercholesterolemia: Secondary | ICD-10-CM | POA: Diagnosis not present

## 2014-04-09 DIAGNOSIS — Z96651 Presence of right artificial knee joint: Secondary | ICD-10-CM | POA: Diagnosis not present

## 2014-04-09 DIAGNOSIS — E038 Other specified hypothyroidism: Secondary | ICD-10-CM | POA: Diagnosis not present

## 2014-04-09 DIAGNOSIS — N390 Urinary tract infection, site not specified: Secondary | ICD-10-CM | POA: Diagnosis not present

## 2014-04-09 DIAGNOSIS — E782 Mixed hyperlipidemia: Secondary | ICD-10-CM | POA: Diagnosis not present

## 2014-04-09 DIAGNOSIS — Z79899 Other long term (current) drug therapy: Secondary | ICD-10-CM | POA: Diagnosis not present

## 2014-04-09 DIAGNOSIS — R278 Other lack of coordination: Secondary | ICD-10-CM | POA: Diagnosis not present

## 2014-04-09 DIAGNOSIS — D649 Anemia, unspecified: Secondary | ICD-10-CM | POA: Diagnosis not present

## 2014-04-09 LAB — GLUCOSE, CAPILLARY
GLUCOSE-CAPILLARY: 77 mg/dL (ref 70–99)
Glucose-Capillary: 211 mg/dL — ABNORMAL HIGH (ref 70–99)

## 2014-04-09 MED ORDER — HYDROCODONE-ACETAMINOPHEN 10-325 MG PO TABS: 1.0000 | ORAL_TABLET | Freq: Four times a day (QID) | ORAL | Status: DC | PRN

## 2014-04-09 MED ORDER — INSULIN DETEMIR 100 UNIT/ML ~~LOC~~ SOLN: 45.0000 [IU] | Freq: Two times a day (BID) | SUBCUTANEOUS | Status: DC

## 2014-04-09 MED ORDER — CIPROFLOXACIN HCL 500 MG PO TABS: 500.0000 mg | ORAL_TABLET | Freq: Two times a day (BID) | ORAL | Status: DC

## 2014-04-09 MED ORDER — CYANOCOBALAMIN 1000 MCG/ML IJ SOLN: 1000.0000 ug | Freq: Every day | INTRAMUSCULAR | Status: DC

## 2014-04-09 NOTE — Clinical Social Work Note (Signed)
Pt d/c today to SNF. Pt, pt's brother [name] and facility aware and agreeable. D/C summary faxed. Facility [facility] to provide transport.  Benay Pike, [place]

## 2014-04-09 NOTE — Progress Notes (Signed)
NURSING PROGRESS NOTE  [name] [id number] Discharge Data: 04/09/2014 12:40 PM Attending Provider: No att. providers found PQD:IYMEBR,AXENMM, MD   [name] to be D/C'd Nursing Home per MD order.    All IV's discontinued and monitored for bleeding.  All belongings returned to patient for patient to take home.  Packet prepared by SW given to transporter.  Patient left floor via wheelchair, escorted by NT.  Last Documented Vital Signs:  Blood pressure 132/68, pulse 87, temperature 97.9 F (36.6 C), temperature source Oral, resp. rate 18, height 6\' 1"  (1.854 m), weight 138.1 kg (304 lb 7.3 oz), SpO2 95 %.  Cecilie Kicks D

## 2014-04-09 NOTE — Clinical Social Work Placement (Signed)
Clinical Social Work Department CLINICAL SOCIAL WORK PLACEMENT NOTE 04/09/2014  Patient:  [name], [name]  Account Number:  [id number] Admit date:  04/06/2014  Clinical Social Worker:  Benay Pike, LCSW  Date/time:  04/07/2014 02:47 PM  Clinical Social Work is seeking post-discharge placement for this patient at the following level of care:   [place]   (*CSW will update this form in Epic as items are completed)   04/07/2014  Patient/family provided with [place] Department of Clinical Social Work's list of facilities offering this level of care within the geographic area requested by the patient (or if unable, by the patient's family).  04/07/2014  Patient/family informed of their freedom to choose among providers that offer the needed level of care, that participate in Medicare, Medicaid or managed care program needed by the patient, have an available bed and are willing to accept the patient.  04/07/2014  Patient/family informed of [facility]' ownership interest in [facility], as well as of the fact that they are under no obligation to receive care at this facility.  PASARR submitted to EDS on 04/07/2014 PASARR number received on 04/07/2014  FL2 transmitted to all facilities in geographic area requested by pt/family on  04/07/2014 FL2 transmitted to all facilities within larger geographic area on   Patient informed that his/her managed care company has contracts with or will negotiate with  certain facilities, including the following:     Patient/family informed of bed offers received:  04/08/2014 Patient chooses bed at [facility] Physician recommends and patient chooses bed at    Patient to be transferred to [facility] on  04/09/2014 Patient to be transferred to facility by facility [facility] Patient and family notified of transfer on 04/09/2014 Name of family member notified:  [name]- brother  The following physician  request were entered in Epic:   Additional Comments:  Benay Pike, [place]
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
## 2014-04-14 ENCOUNTER — Ambulatory Visit (INDEPENDENT_AMBULATORY_CARE_PROVIDER_SITE_OTHER): Payer: Self-pay | Admitting: Orthopedic Surgery

## 2014-04-14 ENCOUNTER — Encounter: Payer: Self-pay | Admitting: Orthopedic Surgery

## 2014-04-14 VITALS — BP 97/53 | Ht 73.0 in | Wt 304.5 lb

## 2014-04-14 DIAGNOSIS — M23303 Other meniscus derangements, unspecified medial meniscus, right knee: Secondary | ICD-10-CM

## 2014-04-14 DIAGNOSIS — Z9889 Other specified postprocedural states: Secondary | ICD-10-CM

## 2014-04-14 DIAGNOSIS — M1711 Unilateral primary osteoarthritis, right knee: Secondary | ICD-10-CM

## 2014-04-14 DIAGNOSIS — M23300 Other meniscus derangements, unspecified lateral meniscus, right knee: Secondary | ICD-10-CM

## 2014-04-14 DIAGNOSIS — M23306 Other meniscus derangements, unspecified meniscus, right knee: Secondary | ICD-10-CM

## 2014-04-14 LAB — HEMOGLOBIN A1C
Hgb A1c MFr Bld: 7.5 % — ABNORMAL HIGH (ref 4.8–5.6)
MEAN PLASMA GLUCOSE: 169 mg/dL
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
## 2014-04-14 NOTE — Progress Notes (Signed)
Postoperative visit  Chief Complaint  Patient presents with  . Follow-up    post op SARK W/ MM 04/04/14    The patient had arthroscopy of his knee with a lateral meniscal tear meniscectomy with degenerative arthritis on March 11 he was admitted to the hospital for urinary retention and infection is now a [place] home. He is receiving therapies ambulatory including walking on a treadmill  His knee looks good his portals are clean his knee flexion arc is about 115 he has a chronic 5 flexion contracture.  Encounter Diagnoses  Name Primary?  . S/P right knee arthroscopy Yes  . Primary osteoarthritis of right knee   . Meniscus, lateral, derangement, right      He can return home at their discretion but should continue physical therapy and follow-up with me in 5 weeks

## 2014-04-14 NOTE — Addendum Note (Signed)
Addended by: Baldomero Lamy B on: 04/14/2014 05:00 PM   Modules accepted: Medications

## 2014-04-16 ENCOUNTER — Non-Acute Institutional Stay (SKILLED_NURSING_FACILITY): Payer: Medicare Other | Admitting: Internal Medicine

## 2014-04-16 DIAGNOSIS — E538 Deficiency of other specified B group vitamins: Secondary | ICD-10-CM | POA: Diagnosis not present

## 2014-04-16 DIAGNOSIS — E1142 Type 2 diabetes mellitus with diabetic polyneuropathy: Secondary | ICD-10-CM

## 2014-04-16 DIAGNOSIS — R269 Unspecified abnormalities of gait and mobility: Secondary | ICD-10-CM

## 2014-04-23 ENCOUNTER — Telehealth: Payer: Self-pay | Admitting: Family Medicine

## 2014-04-24 DIAGNOSIS — F329 Major depressive disorder, single episode, unspecified: Secondary | ICD-10-CM | POA: Diagnosis not present

## 2014-04-24 DIAGNOSIS — Z794 Long term (current) use of insulin: Secondary | ICD-10-CM | POA: Diagnosis not present

## 2014-04-24 DIAGNOSIS — I10 Essential (primary) hypertension: Secondary | ICD-10-CM | POA: Diagnosis not present

## 2014-04-24 DIAGNOSIS — E119 Type 2 diabetes mellitus without complications: Secondary | ICD-10-CM | POA: Diagnosis not present

## 2014-04-24 DIAGNOSIS — Z87440 Personal history of urinary (tract) infections: Secondary | ICD-10-CM | POA: Diagnosis not present

## 2014-04-24 DIAGNOSIS — Z4789 Encounter for other orthopedic aftercare: Secondary | ICD-10-CM | POA: Diagnosis not present

## 2014-04-24 NOTE — Telephone Encounter (Signed)
Spoke with patient and advised that I didn't see any paperwork here that needed to be sent. Patient verbalized understanding

## 2014-04-24 NOTE — Telephone Encounter (Signed)
error 

## 2014-04-25 DIAGNOSIS — Z87440 Personal history of urinary (tract) infections: Secondary | ICD-10-CM | POA: Diagnosis not present

## 2014-04-25 DIAGNOSIS — F329 Major depressive disorder, single episode, unspecified: Secondary | ICD-10-CM | POA: Diagnosis not present

## 2014-04-25 DIAGNOSIS — I10 Essential (primary) hypertension: Secondary | ICD-10-CM | POA: Diagnosis not present

## 2014-04-25 DIAGNOSIS — E119 Type 2 diabetes mellitus without complications: Secondary | ICD-10-CM | POA: Diagnosis not present

## 2014-04-25 DIAGNOSIS — Z4789 Encounter for other orthopedic aftercare: Secondary | ICD-10-CM | POA: Diagnosis not present

## 2014-04-28 ENCOUNTER — Other Ambulatory Visit (INDEPENDENT_AMBULATORY_CARE_PROVIDER_SITE_OTHER): Payer: Medicare Other

## 2014-04-28 DIAGNOSIS — E890 Postprocedural hypothyroidism: Secondary | ICD-10-CM

## 2014-04-29 ENCOUNTER — Ambulatory Visit: Payer: Medicare Other | Admitting: Family Medicine

## 2014-04-29 LAB — THYROID PANEL WITH TSH
FREE THYROXINE INDEX: 2.6 (ref 1.2–4.9)
T3 Uptake Ratio: 36 % (ref 24–39)
T4, Total: 7.3 ug/dL (ref 4.5–12.0)
TSH: 0.902 u[IU]/mL (ref 0.450–4.500)
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
## 2014-05-01 ENCOUNTER — Telehealth: Payer: Self-pay | Admitting: *Deleted

## 2014-05-01 ENCOUNTER — Ambulatory Visit (INDEPENDENT_AMBULATORY_CARE_PROVIDER_SITE_OTHER): Payer: Medicare Other | Admitting: Family Medicine

## 2014-05-01 ENCOUNTER — Encounter: Payer: Self-pay | Admitting: Family Medicine

## 2014-05-01 VITALS — BP 121/65 | HR 78 | Temp 98.0°F | Ht 73.0 in | Wt 300.4 lb

## 2014-05-01 DIAGNOSIS — Z794 Long term (current) use of insulin: Secondary | ICD-10-CM

## 2014-05-01 DIAGNOSIS — M2391 Unspecified internal derangement of right knee: Secondary | ICD-10-CM

## 2014-05-01 DIAGNOSIS — E042 Nontoxic multinodular goiter: Secondary | ICD-10-CM

## 2014-05-01 DIAGNOSIS — E119 Type 2 diabetes mellitus without complications: Secondary | ICD-10-CM | POA: Diagnosis not present

## 2014-05-01 MED ORDER — INSULIN DETEMIR 100 UNIT/ML ~~LOC~~ SOLN: 70.0000 [IU] | Freq: Every day | SUBCUTANEOUS | Status: DC

## 2014-05-01 NOTE — Progress Notes (Signed)
Pt notified of results
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
## 2014-05-01 NOTE — Progress Notes (Signed)
Subjective:  Patient ID: [name], male    DOB: Apr 12, 1945  Age: 69 y.o. MRN: [id number]  CC: Hypothyroidism   HPI [name] presents for f/u bladder Infection post op. On levemir 64 recently had knee surgery and subsequently was diagnosed with bladder infection he recently finished his antibiotic and is in today for recheck of his diabetes as well as the infection.  Follow-up of diabetes. Patient does check blood sugar at home area running in the 150 range fasting and postprandial Patient denies symptoms such as polyuria, polydipsia, excessive hunger, nausea No significant hypoglycemic spells noted. Medications as noted below. Taking them regularly without complication/adverse reaction being reported today.   Patient presents for follow-up on  thyroid. He has a history of hypothyroidism for many years. It has been stable recently. Pt. denies any change in  voice, loss of hair, heat or cold intolerance. Energy level has been adequate to good. He denies constipation and diarrhea. No myxedema. Medication is as noted below. Verified that pt is taking it daily on an empty stomach. Well tolerated.   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   He has past surgical history that includes Appendectomy; Aortic valve replacement (July 2006); Dental surgery (05/2004); Heel spur surgery (Bilateral); Colonoscopy (04/24/2007); Tonsillectomy; bilateral cataract surg; Colonoscopy with esophagogastroduodenoscopy (egd) (N/A, 04/05/2012); Thyroidectomy (03/21/2013); Thyroidectomy (N/A, 03/21/2013); and Knee arthroscopy with lateral menisectomy (Right, 04/04/2014).   His family history  includes Diabetes in an other family member.He reports that he quit smoking about 52 years ago. His smoking use included Cigarettes. He has a 6 pack-year smoking history. He has never used smokeless tobacco. He reports that he does not drink alcohol or use illicit drugs.  Current Outpatient Prescriptions on File Prior to Visit  Medication Sig Dispense Refill  . acarbose (PRECOSE) 100 MG tablet TAKE ONE TABLET BY MOUTH TWICE DAILY 180 tablet 1  . aspirin EC 81 MG tablet Take 81 mg by mouth daily.    . calcium-vitamin D (OSCAL WITH D) 500-200 MG-UNIT per tablet Take 2 tablets by mouth 2 (two) times daily. (Patient taking differently: Take 1 tablet by mouth 2 (two) times daily. ) 60 tablet 1  . citalopram (CELEXA) 40 MG tablet Take 20 mg by mouth daily.    . cyanocobalamin (,VITAMIN B-12,) 1000 MCG/ML injection Inject 1 mL (1,000 mcg total) into the muscle daily. Inject daily for 6 more days, and then monthly. 1 mL 0  . furosemide (LASIX) 40 MG tablet Take 1 tablet (40 mg total) by mouth daily. 30 tablet 4  . HYDROcodone-acetaminophen (NORCO) 10-325 MG per tablet Take 1 tablet by mouth every 6 (six) hours as needed. 30 tablet 0  . levothyroxine (SYNTHROID, LEVOTHROID) 200 MCG tablet Take 1 tablet (200 mcg total) by mouth daily before breakfast. 30 tablet 2  . lisinopril (PRINIVIL,ZESTRIL) 40 MG tablet Take 20 mg by mouth daily.    . metFORMIN (GLUCOPHAGE) 1000 MG tablet Take 1 tablet (1,000 mg total) by mouth 2 (two) times daily with a meal. 180 tablet 3  . metoprolol tartrate (LOPRESSOR) 25 MG tablet Take 0.5 tablets (12.5 mg total) by mouth 2 (two) times daily. 60 tablet 4  . Multiple Vitamins-Minerals (MENS MULTI VITAMIN & MINERAL PO) Take 1 tablet by mouth daily.      Marland Kitchen  Omega-3 Fatty Acids (FISH OIL) 1200 MG CAPS Take 1-2 capsules by mouth 2 (two) times daily. 1 capsule by mouth q am, 2 capsules by mouth q pm    . simvastatin (ZOCOR) 40 MG tablet Take 1 tablet (40 mg total) by mouth daily at 6 PM.  90 tablet 0   No current facility-administered medications on file prior to visit.    ROS Review of Systems  Constitutional: Negative for fever, chills, diaphoresis and unexpected weight change.  HENT: Negative for congestion, hearing loss, rhinorrhea, sore throat and trouble swallowing.   Respiratory: Negative for cough, chest tightness, shortness of breath and wheezing.   Gastrointestinal: Negative for nausea, vomiting, abdominal pain, diarrhea, constipation and abdominal distention.  Endocrine: Negative for cold intolerance and heat intolerance.  Genitourinary: Negative for dysuria, hematuria and flank pain.  Musculoskeletal: Negative for joint swelling and arthralgias.  Skin: Negative for rash.  Neurological: Negative for dizziness and headaches.  Psychiatric/Behavioral: Negative for dysphoric mood, decreased concentration and agitation. The patient is not nervous/anxious.     Objective:  BP 121/65 mmHg  Pulse 78  Temp(Src) 98 F (36.7 C) (Oral)  Ht 6\' 1"  (1.854 m)  Wt 300 lb 6.4 oz (136.261 kg)  BMI 39.64 kg/m2  BP Readings from Last 3 Encounters:  05/01/14 121/65  04/14/14 97/53  04/04/14 129/66    Wt Readings from Last 3 Encounters:  05/01/14 300 lb 6.4 oz (136.261 kg)  04/14/14 304 lb 7.4 oz (138.102 kg)  04/04/14 315 lb (142.883 kg)     Physical Exam  Constitutional: He is oriented to person, place, and time. He appears well-developed and well-nourished. No distress.  HENT:  Head: Normocephalic and atraumatic.  Right Ear: External ear normal.  Left Ear: External ear normal.  Nose: Nose normal.  Mouth/Throat: Oropharynx is clear and moist.  Eyes: Conjunctivae and EOM are normal. Pupils are equal, round, and reactive to light.  Neck: Normal range of motion. Neck supple. No thyromegaly present.  Cardiovascular: Normal rate, regular rhythm and normal heart sounds.   No murmur heard. Pulmonary/Chest: Effort normal and breath sounds normal. No respiratory  distress. He has no wheezes. He has no rales.  Abdominal: Soft. Bowel sounds are normal. He exhibits no distension. There is no tenderness.  Lymphadenopathy:    He has no cervical adenopathy.  Neurological: He is alert and oriented to person, place, and time. He has normal reflexes.  Skin: Skin is warm and dry.  Psychiatric: He has a normal mood and affect. His behavior is normal. Judgment and thought content normal.    Lab Results  Component Value Date   HGBA1C 7.5* 04/06/2014   HGBA1C 7.1 % 03/12/2014   HGBA1C 7.0 12/09/2013    Lab Results  Component Value Date   WBC 8.2 04/07/2014   HGB 12.0* 04/07/2014   HCT 37.2* 04/07/2014   PLT 157 04/07/2014   GLUCOSE 151* 04/07/2014   CHOL 154 12/09/2013   TRIG 98 12/09/2013   HDL 49 12/09/2013   LDLCALC 95 08/30/2013   ALT 15 03/12/2014   AST 16 03/12/2014   NA 138 04/07/2014   K 4.2 04/07/2014   CL 103 04/07/2014   CREATININE 1.06 04/07/2014   BUN 20 04/07/2014   CO2 29 04/07/2014   TSH 0.902 04/28/2014   PSA 0.2 06/14/2013   HGBA1C 7.5* 04/06/2014   MICROALBUR 1.50 04/16/2012    Dg Chest 1 View  04/06/2014   CLINICAL DATA:  Acute onset of severe generalized weakness, chills, urinary  frequency. Postop ligament repair in the right knee 2 days ago. Current history of diabetes. Former smoker.  EXAM: CHEST  1 VIEW  COMPARISON:  03/14/2013 and earlier.  FINDINGS: Prior sternotomy. Cardiac silhouette upper normal in size to slightly enlarged for technique. Lungs clear. Bronchovascular markings normal. Pulmonary vascularity normal. No visible pleural effusions. No pneumothorax. Stable mild elevation of the right hemidiaphragm.  IMPRESSION: Borderline to mild cardiomegaly.  No acute cardiopulmonary disease.   Electronically Signed   By: Evangeline Dakin M.D.   On: 04/06/2014 11:14    Assessment & Plan:   Chidi was seen today for hypothyroidism.  Diagnoses and all orders for this visit:  Derangement of right knee Orders: -      Ambulatory referral to Physical Therapy  DM type 2, not at goal  Long term current use of insulin  GOITER, MULTINODULAR  Other orders -     insulin detemir (LEVEMIR) 100 UNIT/ML injection; Inject 0.7 mLs (70 Units total) into the skin at bedtime.  I have discontinued Mr. Rase's ciprofloxacin. I have also changed his insulin detemir. Additionally, I am having him maintain his Fish Oil, Multiple Vitamins-Minerals (MENS MULTI VITAMIN & MINERAL PO), calcium-vitamin D, simvastatin, metoprolol tartrate, furosemide, metFORMIN, levothyroxine, acarbose, citalopram, lisinopril, aspirin EC, cyanocobalamin, and HYDROcodone-acetaminophen.  Meds ordered this encounter  Medications  . DISCONTD: lisinopril (PRINIVIL,ZESTRIL) 20 MG tablet    Sig: Take 1 tablet by mouth daily.  . insulin detemir (LEVEMIR) 100 UNIT/ML injection    Sig: Inject 0.7 mLs (70 Units total) into the skin at bedtime.    Dispense:  30 mL    Refill:  11     Follow-up: Return in about 6 weeks (around 06/12/2014).  Claretta Fraise, M.D.

## 2014-05-02 ENCOUNTER — Telehealth: Payer: Self-pay | Admitting: Family Medicine

## 2014-05-02 DIAGNOSIS — M25561 Pain in right knee: Secondary | ICD-10-CM

## 2014-05-02 NOTE — Telephone Encounter (Signed)
Referral made patient aware.

## 2014-05-05 ENCOUNTER — Other Ambulatory Visit: Payer: Self-pay | Admitting: *Deleted

## 2014-05-05 DIAGNOSIS — Z9889 Other specified postprocedural states: Secondary | ICD-10-CM

## 2014-05-07 NOTE — Telephone Encounter (Signed)
OK to hold ASA.  Based on ACC/AHA guidelines, the patient would be at acceptable risk for the planned procedure without further cardiovascular testing.

## 2014-05-08 DIAGNOSIS — R269 Unspecified abnormalities of gait and mobility: Secondary | ICD-10-CM | POA: Diagnosis not present

## 2014-05-08 DIAGNOSIS — M25561 Pain in right knee: Secondary | ICD-10-CM | POA: Diagnosis not present

## 2014-05-08 DIAGNOSIS — Z9889 Other specified postprocedural states: Secondary | ICD-10-CM | POA: Diagnosis not present

## 2014-05-08 NOTE — Telephone Encounter (Signed)
Clearance faxed to surgeons office. Patient notified

## 2014-05-13 ENCOUNTER — Telehealth: Payer: Self-pay | Admitting: Family Medicine

## 2014-05-13 DIAGNOSIS — R269 Unspecified abnormalities of gait and mobility: Secondary | ICD-10-CM | POA: Diagnosis not present

## 2014-05-13 DIAGNOSIS — Z9889 Other specified postprocedural states: Secondary | ICD-10-CM | POA: Diagnosis not present

## 2014-05-13 DIAGNOSIS — M25561 Pain in right knee: Secondary | ICD-10-CM | POA: Diagnosis not present

## 2014-05-13 NOTE — Telephone Encounter (Signed)
Appointment given for tomorrow with [place].

## 2014-05-14 ENCOUNTER — Encounter: Payer: Self-pay | Admitting: Family

## 2014-05-14 ENCOUNTER — Ambulatory Visit (INDEPENDENT_AMBULATORY_CARE_PROVIDER_SITE_OTHER): Payer: Medicare Other | Admitting: Family

## 2014-05-14 VITALS — BP 121/60 | HR 75 | Temp 97.0°F | Ht 73.0 in | Wt 299.4 lb

## 2014-05-14 DIAGNOSIS — R300 Dysuria: Secondary | ICD-10-CM

## 2014-05-14 DIAGNOSIS — N390 Urinary tract infection, site not specified: Secondary | ICD-10-CM

## 2014-05-14 DIAGNOSIS — E538 Deficiency of other specified B group vitamins: Secondary | ICD-10-CM

## 2014-05-14 LAB — POCT URINALYSIS DIPSTICK
Bilirubin, UA: NEGATIVE
Glucose, UA: 500
KETONES UA: NEGATIVE
Nitrite, UA: NEGATIVE
PROTEIN UA: NEGATIVE
RBC UA: NEGATIVE
SPEC GRAV UA: 1.015
UROBILINOGEN UA: NEGATIVE
pH, UA: 6

## 2014-05-14 LAB — POCT UA - MICROSCOPIC ONLY
CRYSTALS, UR, HPF, POC: NEGATIVE
Casts, Ur, LPF, POC: NEGATIVE
Mucus, UA: NEGATIVE
RBC, URINE, MICROSCOPIC: NEGATIVE
Yeast, UA: NEGATIVE

## 2014-05-14 MED ORDER — CIPROFLOXACIN HCL 500 MG PO TABS: 500.0000 mg | ORAL_TABLET | Freq: Two times a day (BID) | ORAL | Status: DC

## 2014-05-14 MED ORDER — CYANOCOBALAMIN 1000 MCG/ML IJ SOLN
1000.0000 ug | INTRAMUSCULAR | Status: DC
  Administered 2014-05-14: 1000 ug via INTRAMUSCULAR

## 2014-05-14 NOTE — Patient Instructions (Signed)

## 2014-05-14 NOTE — Progress Notes (Signed)
   Subjective:    Patient ID: [name], male    DOB: 04/10/1945, 69 y.o.   MRN: [id number]  Dysuria  This is a new problem. The current episode started in the past 7 days. The problem occurs every urination. The problem has been unchanged. The quality of the pain is described as burning. The pain is at a severity of 3/10. The pain is mild. There has been no fever. Associated symptoms include frequency and urgency. Pertinent negatives include no chills, discharge, flank pain, hematuria, hesitancy, nausea or vomiting. He has tried increased fluids for the symptoms. The treatment provided mild relief.      Review of Systems  Constitutional: Negative.  Negative for chills.  HENT: Negative.   Respiratory: Negative.   Cardiovascular: Negative.   Gastrointestinal: Negative.  Negative for nausea and vomiting.  Endocrine: Negative.   Genitourinary: Positive for dysuria, urgency and frequency. Negative for hesitancy, hematuria and flank pain.  Musculoskeletal: Negative.   Neurological: Negative.   Hematological: Negative.   Psychiatric/Behavioral: Negative.   All other systems reviewed and are negative.      Objective:   Physical Exam  Constitutional: He is oriented to person, place, and time. He appears well-developed and well-nourished. No distress.  HENT:  Head: Normocephalic.  Eyes: Pupils are equal, round, and reactive to light. Right eye exhibits no discharge. Left eye exhibits no discharge.  Neck: Normal range of motion. Neck supple. No thyromegaly present.  Cardiovascular: Normal rate, regular rhythm, normal heart sounds and intact distal pulses.   No murmur heard. Pulmonary/Chest: Effort normal and breath sounds normal. No respiratory distress. He has no wheezes.  Abdominal: Soft. Bowel sounds are normal. He exhibits no distension. There is no tenderness.  Musculoskeletal: Normal range of motion. He exhibits no edema or tenderness.  Negative for CVA tenderness     Neurological: He is alert and oriented to person, place, and time. He has normal reflexes. No cranial nerve deficit.  Skin: Skin is warm and dry. No rash noted. No erythema.  Psychiatric: He has a normal mood and affect. His behavior is normal. Judgment and thought content normal.  Vitals reviewed.     BP 121/60 mmHg  Pulse 75  Temp(Src) 97 F (36.1 C) (Oral)  Ht 6\' 1"  (1.854 m)  Wt 299 lb 6.4 oz (135.807 kg)  BMI 39.51 kg/m2     Assessment & Plan:  1. Dysuria - POCT urinalysis dipstick - POCT UA - Microscopic Only  2. Low vitamin B12 level - cyanocobalamin ((VITAMIN B-12)) injection 1,000 mcg; Inject 1 mL (1,000 mcg total) into the muscle every 30 (thirty) days.  3. Urinary tract infection without hematuria, site unspecified -Force fluids AZO over the counter X2 days RTO 2 weeks Culture pending - ciprofloxacin (CIPRO) 500 MG tablet; Take 1 tablet (500 mg total) by mouth 2 (two) times daily.  Dispense: 20 tablet; Refill: 0  Evelina Dun, FNP

## 2014-05-15 DIAGNOSIS — E1140 Type 2 diabetes mellitus with diabetic neuropathy, unspecified: Secondary | ICD-10-CM | POA: Diagnosis not present

## 2014-05-15 DIAGNOSIS — Z9889 Other specified postprocedural states: Secondary | ICD-10-CM | POA: Diagnosis not present

## 2014-05-15 DIAGNOSIS — R269 Unspecified abnormalities of gait and mobility: Secondary | ICD-10-CM | POA: Diagnosis not present

## 2014-05-15 DIAGNOSIS — L609 Nail disorder, unspecified: Secondary | ICD-10-CM | POA: Diagnosis not present

## 2014-05-15 DIAGNOSIS — L110 Acquired keratosis follicularis: Secondary | ICD-10-CM | POA: Diagnosis not present

## 2014-05-15 DIAGNOSIS — E1151 Type 2 diabetes mellitus with diabetic peripheral angiopathy without gangrene: Secondary | ICD-10-CM | POA: Diagnosis not present

## 2014-05-15 DIAGNOSIS — M25561 Pain in right knee: Secondary | ICD-10-CM | POA: Diagnosis not present

## 2014-05-16 ENCOUNTER — Other Ambulatory Visit: Payer: Self-pay | Admitting: Family

## 2014-05-16 LAB — URINE CULTURE

## 2014-05-16 MED ORDER — NITROFURANTOIN MONOHYD MACRO 100 MG PO CAPS: 100.0000 mg | ORAL_CAPSULE | Freq: Two times a day (BID) | ORAL | Status: DC

## 2014-05-19 ENCOUNTER — Encounter: Payer: Self-pay | Admitting: Orthopedic Surgery

## 2014-05-19 ENCOUNTER — Ambulatory Visit (INDEPENDENT_AMBULATORY_CARE_PROVIDER_SITE_OTHER): Payer: Self-pay | Admitting: Orthopedic Surgery

## 2014-05-19 ENCOUNTER — Telehealth: Payer: Self-pay | Admitting: Family

## 2014-05-19 VITALS — BP 132/55 | Ht 73.0 in | Wt 299.4 lb

## 2014-05-19 DIAGNOSIS — Z9889 Other specified postprocedural states: Secondary | ICD-10-CM

## 2014-05-19 NOTE — Telephone Encounter (Signed)
Aware to start new script for urine infection.

## 2014-05-19 NOTE — Progress Notes (Signed)
Chief Complaint  Patient presents with  . Follow-up    post op 2, SARK W/MM, DOS 04/04/14   The patient comes in now approximate 6 weeks postop from his arthroscopy with medial meniscectomy and other pathology. He's doing well with some posterior knee pain. He's regaining knee flexion and extension and he will come back to see [name] in a month  We found on physical findings today full extension flexion 115 no swelling  Ambulating without assistance  Come  back in a month continue working on strength

## 2014-05-20 DIAGNOSIS — R269 Unspecified abnormalities of gait and mobility: Secondary | ICD-10-CM | POA: Diagnosis not present

## 2014-05-20 DIAGNOSIS — Z9889 Other specified postprocedural states: Secondary | ICD-10-CM | POA: Diagnosis not present

## 2014-05-20 DIAGNOSIS — M25561 Pain in right knee: Secondary | ICD-10-CM | POA: Diagnosis not present

## 2014-05-22 DIAGNOSIS — R269 Unspecified abnormalities of gait and mobility: Secondary | ICD-10-CM | POA: Diagnosis not present

## 2014-05-22 DIAGNOSIS — M25561 Pain in right knee: Secondary | ICD-10-CM | POA: Diagnosis not present

## 2014-05-22 DIAGNOSIS — Z9889 Other specified postprocedural states: Secondary | ICD-10-CM | POA: Diagnosis not present

## 2014-05-26 ENCOUNTER — Telehealth: Payer: Self-pay | Admitting: Family Medicine

## 2014-05-27 DIAGNOSIS — Z9889 Other specified postprocedural states: Secondary | ICD-10-CM | POA: Diagnosis not present

## 2014-05-27 DIAGNOSIS — R269 Unspecified abnormalities of gait and mobility: Secondary | ICD-10-CM | POA: Diagnosis not present

## 2014-05-27 DIAGNOSIS — M25561 Pain in right knee: Secondary | ICD-10-CM | POA: Diagnosis not present

## 2014-05-27 MED ORDER — LEVOTHYROXINE SODIUM 200 MCG PO TABS: 200.0000 ug | ORAL_TABLET | Freq: Every day | ORAL | Status: DC

## 2014-05-27 NOTE — Telephone Encounter (Signed)
done

## 2014-05-29 DIAGNOSIS — R269 Unspecified abnormalities of gait and mobility: Secondary | ICD-10-CM | POA: Diagnosis not present

## 2014-05-29 DIAGNOSIS — Z9889 Other specified postprocedural states: Secondary | ICD-10-CM | POA: Diagnosis not present

## 2014-05-29 DIAGNOSIS — M25561 Pain in right knee: Secondary | ICD-10-CM | POA: Diagnosis not present

## 2014-06-02 ENCOUNTER — Telehealth: Payer: Self-pay | Admitting: Family Medicine

## 2014-06-03 DIAGNOSIS — M25561 Pain in right knee: Secondary | ICD-10-CM | POA: Diagnosis not present

## 2014-06-03 DIAGNOSIS — R269 Unspecified abnormalities of gait and mobility: Secondary | ICD-10-CM | POA: Diagnosis not present

## 2014-06-03 DIAGNOSIS — Z9889 Other specified postprocedural states: Secondary | ICD-10-CM | POA: Diagnosis not present

## 2014-06-05 DIAGNOSIS — N390 Urinary tract infection, site not specified: Secondary | ICD-10-CM | POA: Diagnosis not present

## 2014-06-05 DIAGNOSIS — R350 Frequency of micturition: Secondary | ICD-10-CM | POA: Diagnosis not present

## 2014-06-05 DIAGNOSIS — J310 Chronic rhinitis: Secondary | ICD-10-CM | POA: Diagnosis not present

## 2014-06-10 DIAGNOSIS — R269 Unspecified abnormalities of gait and mobility: Secondary | ICD-10-CM | POA: Diagnosis not present

## 2014-06-10 DIAGNOSIS — M25561 Pain in right knee: Secondary | ICD-10-CM | POA: Diagnosis not present

## 2014-06-10 DIAGNOSIS — Z9889 Other specified postprocedural states: Secondary | ICD-10-CM | POA: Diagnosis not present

## 2014-06-13 ENCOUNTER — Telehealth: Payer: Self-pay | Admitting: Family

## 2014-06-18 ENCOUNTER — Telehealth: Payer: Self-pay | Admitting: Family Medicine

## 2014-06-19 ENCOUNTER — Other Ambulatory Visit: Payer: Self-pay | Admitting: Family Medicine

## 2014-06-19 NOTE — Telephone Encounter (Signed)
?   Not on med list, we have never ordered

## 2014-06-20 ENCOUNTER — Other Ambulatory Visit: Payer: Self-pay | Admitting: *Deleted

## 2014-06-20 MED ORDER — CYANOCOBALAMIN 1000 MCG/ML IJ SOLN: 1000.0000 ug | INTRAMUSCULAR | Status: DC

## 2014-06-20 NOTE — Progress Notes (Signed)
Pt notified & Rx sent in to pharmacy 

## 2014-06-24 ENCOUNTER — Ambulatory Visit (INDEPENDENT_AMBULATORY_CARE_PROVIDER_SITE_OTHER): Payer: Self-pay | Admitting: Orthopedic Surgery

## 2014-06-24 VITALS — BP 110/46 | Ht 73.0 in | Wt 299.0 lb

## 2014-06-24 DIAGNOSIS — Z9889 Other specified postprocedural states: Secondary | ICD-10-CM

## 2014-06-24 MED ORDER — CYANOCOBALAMIN 1000 MCG/ML IJ SOLN: 1000.0000 ug | INTRAMUSCULAR | Status: DC

## 2014-06-24 NOTE — Telephone Encounter (Signed)
Patient wanted [facility] to give him B-12 injections. He was advised to get injections here for good documentation and rechecks for labs.  Insurance may not cover to have injections at pharmacy since it is not an immunization. He says it is to far to drive.

## 2014-06-24 NOTE — Telephone Encounter (Signed)
B12 INJ SENT INTO [facility] PT NOTIFIED

## 2014-06-24 NOTE — Progress Notes (Signed)
Patient ID: [name], male   DOB: Jun 04, 1945, 69 y.o.   MRN: [id number] Chief Complaint  Patient presents with  . Follow-up    Recheck on right knee, DOS 04-04-14.    Patient had knee surgery March 11. Doing well has occasional knee pain is walking well he has no significant symptoms his knee looks good no effusion just minimal joint line tenderness is ambulatory walking well follow-up as needed

## 2014-07-01 ENCOUNTER — Ambulatory Visit (INDEPENDENT_AMBULATORY_CARE_PROVIDER_SITE_OTHER): Payer: Medicare Other

## 2014-07-01 ENCOUNTER — Encounter: Payer: Self-pay | Admitting: Family Medicine

## 2014-07-01 ENCOUNTER — Ambulatory Visit: Payer: Medicare Other | Admitting: Family Medicine

## 2014-07-01 ENCOUNTER — Other Ambulatory Visit: Payer: Self-pay | Admitting: Nurse Practitioner

## 2014-07-01 ENCOUNTER — Ambulatory Visit (INDEPENDENT_AMBULATORY_CARE_PROVIDER_SITE_OTHER): Payer: Medicare Other | Admitting: Family Medicine

## 2014-07-01 VITALS — BP 130/62 | HR 74 | Temp 97.5°F | Ht 73.0 in | Wt 300.0 lb

## 2014-07-01 DIAGNOSIS — R251 Tremor, unspecified: Secondary | ICD-10-CM

## 2014-07-01 DIAGNOSIS — R079 Chest pain, unspecified: Secondary | ICD-10-CM

## 2014-07-01 NOTE — Progress Notes (Signed)
Subjective:  Patient ID: [name], male    DOB: 1945/11/28  Age: 69 y.o. MRN: [id number]  CC: Forms for [facility] and Chest hurting at times   HPI [name] presents for need for evaluation for home attendant's due to disability. Primarily is having problems with tremor in the hands that keeps him from performing multiple activities of daily living. Primarily he can't or do housework. He can do most personal grooming without assistance. He is low back pain that prevents him from walking more than several feet at a time. He is having central chest pain for the last 2-3 days it is episodic it is a dull ache with some pressure component it does not radiate it is not precordial it is substernal. It is not related to activity/exertion. It is not relieved by rest. It is random fasting from a few minutes to several hours at a time. He has been treated with multiple medications by the [place] none of which have relieved the tremor. He sister and several trials but she does not remember the names of any medications.  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   He has past surgical history that includes Appendectomy; Aortic valve replacement (July 2006); Dental surgery (05/2004); Heel spur surgery (Bilateral); Colonoscopy (04/24/2007); Tonsillectomy; bilateral cataract surg; Colonoscopy with esophagogastroduodenoscopy (egd) (N/A, 04/05/2012); Thyroidectomy (03/21/2013); Thyroidectomy (N/A, 03/21/2013); and Knee arthroscopy with lateral menisectomy (Right, 04/04/2014).   His family history includes Diabetes in an other family member.He reports that he quit smoking about 52 years ago. His smoking use  included Cigarettes. He has a 6 pack-year smoking history. He has never used smokeless tobacco. He reports that he does not drink alcohol or use illicit drugs.  Outpatient Prescriptions Prior to Visit  Medication Sig Dispense Refill  . acarbose (PRECOSE) 100 MG tablet TAKE ONE TABLET BY MOUTH TWICE DAILY 180 tablet 1  . aspirin EC 81 MG tablet Take 81 mg by mouth daily.    . calcium-vitamin D (OSCAL WITH D) 500-200 MG-UNIT per tablet Take 2 tablets by mouth 2 (two) times daily. (Patient taking differently: Take 1 tablet by mouth 2 (two) times daily. ) 60 tablet 1  . citalopram (CELEXA) 40 MG tablet Take 20 mg by mouth daily.    . cyanocobalamin (,VITAMIN B-12,) 1000 MCG/ML injection Inject 1 mL (1,000 mcg total) into the muscle every 30 (thirty) days. 1 mL 11  . furosemide (LASIX) 40 MG tablet Take 1 tablet (40 mg total) by mouth daily. 30 tablet 4  . insulin detemir (LEVEMIR) 100 UNIT/ML injection Inject 0.7 mLs (70 Units total) into the skin at bedtime. 30 mL 11  . levothyroxine (SYNTHROID, LEVOTHROID) 200 MCG tablet Take 1 tablet (200 mcg total) by mouth daily before breakfast. 90 tablet 3  . lisinopril (PRINIVIL,ZESTRIL) 40 MG tablet Take 20 mg by mouth daily.    . metFORMIN (GLUCOPHAGE) 1000 MG tablet Take 1 tablet (1,000 mg total) by mouth 2 (two) times daily with a meal. 180 tablet 3  . metoprolol tartrate (LOPRESSOR) 25 MG tablet Take 0.5 tablets (12.5 mg total) by mouth 2 (two) times daily. 60 tablet 4  . Multiple Vitamins-Minerals (MENS MULTI VITAMIN & MINERAL PO) Take 1 tablet by mouth daily.      . Omega-3 Fatty Acids (FISH OIL)  1200 MG CAPS Take 1-2 capsules by mouth 2 (two) times daily. 1 capsule by mouth q am, 2 capsules by mouth q pm    . simvastatin (ZOCOR) 40 MG tablet Take 1 tablet (40 mg total) by mouth daily at 6 PM. 90 tablet 0  . HYDROcodone-acetaminophen (NORCO) 10-325 MG per tablet Take 1 tablet by mouth every 6 (six) hours as needed. (Patient not taking: Reported on  05/19/2014) 30 tablet 0  . nitrofurantoin, macrocrystal-monohydrate, (MACROBID) 100 MG capsule Take 1 capsule (100 mg total) by mouth 2 (two) times daily. 14 capsule 0   No facility-administered medications prior to visit.    ROS Review of Systems  Constitutional: Negative for fever, chills and diaphoresis.  HENT: Negative for congestion, rhinorrhea and sore throat.   Respiratory: Negative for cough, shortness of breath and wheezing.   Cardiovascular: Positive for chest pain.  Gastrointestinal: Negative for nausea, vomiting, abdominal pain, diarrhea, constipation and abdominal distention.  Genitourinary: Negative for dysuria and frequency.  Musculoskeletal: Negative for joint swelling and arthralgias.  Skin: Negative for rash.  Neurological: Positive for tremors (both hands). Negative for headaches.    Objective:  BP 130/62 mmHg  Pulse 74  Temp(Src) 97.5 F (36.4 C) (Oral)  Ht 6\' 1"  (1.854 m)  Wt 300 lb (136.079 kg)  BMI 39.59 kg/m2  BP Readings from Last 3 Encounters:  07/01/14 130/62  06/24/14 110/46  05/19/14 132/55    Wt Readings from Last 3 Encounters:  07/01/14 300 lb (136.079 kg)  06/24/14 299 lb (135.626 kg)  05/19/14 299 lb 6.4 oz (135.807 kg)     Physical Exam  Constitutional: He is oriented to person, place, and time. He appears well-developed and well-nourished. No distress.  HENT:  Head: Normocephalic and atraumatic.  Right Ear: External ear normal.  Left Ear: External ear normal.  Nose: Nose normal.  Mouth/Throat: Oropharynx is clear and moist.  Eyes: Conjunctivae and EOM are normal. Pupils are equal, round, and reactive to light.  Neck: Normal range of motion. Neck supple. No thyromegaly present.  Cardiovascular: Normal rate, regular rhythm and normal heart sounds.   No murmur heard. Pulmonary/Chest: Effort normal and breath sounds normal. No respiratory distress. He has no wheezes. He has no rales.  Abdominal: Soft. Bowel sounds are normal. He  exhibits no distension. There is no tenderness.  Lymphadenopathy:    He has no cervical adenopathy.  Neurological: He is alert and oriented to person, place, and time. He has normal reflexes.  Skin: Skin is warm and dry.  Psychiatric: He has a normal mood and affect. His behavior is normal. Judgment and thought content normal.    Lab Results  Component Value Date   HGBA1C 7.5* 04/06/2014   HGBA1C 7.1 % 03/12/2014   HGBA1C 7.0 12/09/2013    Lab Results  Component Value Date   WBC 8.2 04/07/2014   HGB 12.0* 04/07/2014   HCT 37.2* 04/07/2014   PLT 157 04/07/2014   GLUCOSE 151* 04/07/2014   CHOL 154 12/09/2013   TRIG 98 12/09/2013   HDL 49 12/09/2013   LDLCALC 95 08/30/2013   ALT 15 03/12/2014   AST 16 03/12/2014   NA 138 04/07/2014   K 4.2 04/07/2014   CL 103 04/07/2014   CREATININE 1.06 04/07/2014   BUN 20 04/07/2014   CO2 29 04/07/2014   TSH 0.902 04/28/2014   PSA 0.2 06/14/2013   HGBA1C 7.5* 04/06/2014   MICROALBUR 1.50 04/16/2012    Dg Chest 1 View  04/06/2014  CLINICAL DATA:  Acute onset of severe generalized weakness, chills, urinary frequency. Postop ligament repair in the right knee 2 days ago. Current history of diabetes. Former smoker.  EXAM: CHEST  1 VIEW  COMPARISON:  03/14/2013 and earlier.  FINDINGS: Prior sternotomy. Cardiac silhouette upper normal in size to slightly enlarged for technique. Lungs clear. Bronchovascular markings normal. Pulmonary vascularity normal. No visible pleural effusions. No pneumothorax. Stable mild elevation of the right hemidiaphragm.  IMPRESSION: Borderline to mild cardiomegaly.  No acute cardiopulmonary disease.   Electronically Signed   By: Evangeline Dakin M.D.   On: 04/06/2014 11:14    Assessment & Plan:   [name] was seen today for forms for [facility] and chest hurting at times.  Diagnoses and all orders for this visit:  Chest pain, unspecified chest pain type Orders: -     EKG 12-Lead -     DG Lumbar Spine 2-3 Views;  Future  Tremor of both hands   I have discontinued Mr. Meno HYDROcodone-acetaminophen and nitrofurantoin (macrocrystal-monohydrate). I am also having him maintain his Fish Oil, Multiple Vitamins-Minerals (MENS MULTI VITAMIN & MINERAL PO), calcium-vitamin D, simvastatin, metoprolol tartrate, furosemide, metFORMIN, acarbose, citalopram, lisinopril, aspirin EC, insulin detemir, levothyroxine, and cyanocobalamin.  No orders of the defined types were placed in this encounter.     Follow-up: Return in about 3 months (around 10/01/2014).  Claretta Fraise, M.D.

## 2014-07-02 ENCOUNTER — Telehealth: Payer: Self-pay | Admitting: Family Medicine

## 2014-07-11 ENCOUNTER — Telehealth: Payer: Self-pay | Admitting: Family Medicine

## 2014-07-22 DIAGNOSIS — N3000 Acute cystitis without hematuria: Secondary | ICD-10-CM | POA: Diagnosis not present

## 2014-07-22 DIAGNOSIS — R300 Dysuria: Secondary | ICD-10-CM | POA: Diagnosis not present

## 2014-08-06 ENCOUNTER — Ambulatory Visit (INDEPENDENT_AMBULATORY_CARE_PROVIDER_SITE_OTHER): Payer: Medicare Other | Admitting: Family Medicine

## 2014-08-06 ENCOUNTER — Encounter: Payer: Self-pay | Admitting: Family Medicine

## 2014-08-06 VITALS — BP 136/66 | HR 70 | Temp 97.3°F | Ht 73.0 in | Wt 301.4 lb

## 2014-08-06 DIAGNOSIS — Z139 Encounter for screening, unspecified: Secondary | ICD-10-CM | POA: Diagnosis not present

## 2014-08-06 DIAGNOSIS — D509 Iron deficiency anemia, unspecified: Secondary | ICD-10-CM

## 2014-08-06 DIAGNOSIS — I10 Essential (primary) hypertension: Secondary | ICD-10-CM | POA: Diagnosis not present

## 2014-08-06 DIAGNOSIS — E119 Type 2 diabetes mellitus without complications: Secondary | ICD-10-CM | POA: Diagnosis not present

## 2014-08-06 DIAGNOSIS — E785 Hyperlipidemia, unspecified: Secondary | ICD-10-CM | POA: Diagnosis not present

## 2014-08-06 DIAGNOSIS — Z125 Encounter for screening for malignant neoplasm of prostate: Secondary | ICD-10-CM | POA: Diagnosis not present

## 2014-08-06 LAB — POCT CBC
GRANULOCYTE PERCENT: 69.6 % (ref 37–80)
HCT, POC: 40.8 % — AB (ref 43.5–53.7)
Hemoglobin: 12.9 g/dL — AB (ref 14.1–18.1)
Lymph, poc: 1.8 (ref 0.6–3.4)
MCH: 30.7 pg (ref 27–31.2)
MCHC: 31.6 g/dL — AB (ref 31.8–35.4)
MCV: 97.1 fL — AB (ref 80–97)
MPV: 8.5 fL (ref 0–99.8)
POC Granulocyte: 5.4 (ref 2–6.9)
POC LYMPH %: 23.1 % (ref 10–50)
Platelet Count, POC: 163 10*3/uL (ref 142–424)
RBC: 4.2 M/uL — AB (ref 4.69–6.13)
RDW, POC: 16.2 %
WBC: 7.8 10*3/uL (ref 4.6–10.2)

## 2014-08-06 LAB — POCT GLYCOSYLATED HEMOGLOBIN (HGB A1C): HEMOGLOBIN A1C: 7.4

## 2014-08-06 MED ORDER — INSULIN DETEMIR 100 UNIT/ML ~~LOC~~ SOLN: 90.0000 [IU] | Freq: Two times a day (BID) | SUBCUTANEOUS | Status: DC

## 2014-08-06 NOTE — Patient Outreach (Signed)
[facility]) Care Management  08/06/2014  ANGAD NABERS 05-04-1945 [id number]   Referral from MD, assigned [name], RN to outreach.  [name]. [place], [place] Management [place] Assistant Phone: [phone] Fax: [phone]

## 2014-08-06 NOTE — Progress Notes (Signed)
Subjective:  Patient ID: [name], male    DOB: July 17, 1945  Age: 69 y.o. MRN: [id number]  CC: Diabetes; Hypertension; Hyperlipidemia; and Anemia   HPI [name] presents for  follow-up of hypertension. Patient has no history of headache chest pain or shortness of breath or recent cough. Patient also denies symptoms of TIA such as numbness weakness lateralizing. Patient checks  blood pressure at home and has not had any elevated readings recently. Patient denies side effects from his medication. States taking it regularly.  Patient also  in for follow-up of elevated cholesterol. Doing well without complaints on current medication. Denies side effects of statin including myalgia and arthralgia and nausea. Also in today for liver function testing. Currently no chest pain, shortness of breath or other cardiovascular related symptoms noted.  Follow-up of diabetes. Patient does check blood sugar at home. Readings run between 150 and 200 Patient denies symptoms such as polyuria, polydipsia, excessive hunger, nausea No significant hypoglycemic spells noted. Medications as noted below. Taking them regularly without complication/adverse reaction being reported today. He did decrease his Levemir due to misunderstanding. He was concerned about lows, but never got under 100.  Not using iron currently. No melena. Energy is poor, but relates this to his arthritis pain in the knees. Desires handicap sticker today. 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   He has past surgical history that includes Appendectomy; Aortic valve replacement (July  2006); Dental surgery (05/2004); Heel spur surgery (Bilateral); Colonoscopy (04/24/2007); Tonsillectomy; bilateral cataract surg; Colonoscopy with esophagogastroduodenoscopy (egd) (N/A, 04/05/2012); Thyroidectomy (03/21/2013); Thyroidectomy (N/A, 03/21/2013); and Knee arthroscopy with lateral menisectomy (Right, 04/04/2014).   His family history includes Diabetes in an other family member.He reports that he quit smoking about 52 years ago. His smoking use included Cigarettes. He has a 6 pack-year smoking history. He has never used smokeless tobacco. He reports that he does not drink alcohol or use illicit drugs.  Current Outpatient Prescriptions on File Prior to Visit  Medication Sig Dispense Refill  . acarbose (PRECOSE) 100 MG tablet TAKE ONE TABLET BY MOUTH TWICE DAILY 180 tablet 1  . aspirin EC 81 MG tablet Take 81 mg by mouth daily.    . calcium-vitamin D (OSCAL WITH D) 500-200 MG-UNIT per tablet Take 2 tablets by mouth 2 (two) times daily. (Patient taking differently: Take 1 tablet by mouth 2 (two) times daily. ) 60 tablet 1  . citalopram (CELEXA) 40 MG tablet Take 20 mg by mouth daily.    . cyanocobalamin (,VITAMIN B-12,) 1000 MCG/ML injection Inject 1 mL (1,000 mcg total) into the muscle every 30 (thirty) days. 1 mL 11  . furosemide (LASIX) 40 MG tablet Take 1 tablet (40 mg total) by mouth daily. 30 tablet 4  . levothyroxine (SYNTHROID, LEVOTHROID) 200 MCG tablet Take 1 tablet (200 mcg total) by mouth daily before breakfast. 90 tablet 3  . metFORMIN (GLUCOPHAGE) 1000 MG tablet Take 1 tablet (1,000 mg total) by mouth 2 (two) times daily with a meal. 180 tablet 3  . metoprolol tartrate (LOPRESSOR) 25 MG tablet TAKE ONE-HALF TABLET BY MOUTH TWICE DAILY 60 tablet 5  . Multiple Vitamins-Minerals (MENS MULTI VITAMIN & MINERAL PO) Take 1 tablet by mouth daily.      Marland Kitchen  Omega-3 Fatty Acids (FISH OIL) 1200 MG CAPS Take 1-2 capsules by mouth 2 (two) times daily. 1 capsule by mouth q am, 2 capsules by mouth q  pm     No current facility-administered medications on file prior to visit.    ROS Review of Systems  Constitutional: Negative for fever, chills and diaphoresis.  HENT: Negative for congestion, rhinorrhea and sore throat.   Respiratory: Negative for cough, shortness of breath and wheezing.   Cardiovascular: Negative for chest pain.  Gastrointestinal: Negative for nausea, vomiting, abdominal pain, diarrhea, constipation and abdominal distention.  Genitourinary: Negative for dysuria and frequency.  Musculoskeletal: Positive for myalgias and arthralgias. Negative for joint swelling.  Skin: Negative for rash.  Neurological: Negative for headaches.    Objective:  BP 136/66 mmHg  Pulse 70  Temp(Src) 97.3 F (36.3 C) (Oral)  Ht '6\' 1"'  (1.854 m)  Wt 301 lb 6.4 oz (136.714 kg)  BMI 39.77 kg/m2  BP Readings from Last 3 Encounters:  08/06/14 136/66  07/01/14 130/62  06/24/14 110/46    Wt Readings from Last 3 Encounters:  08/06/14 301 lb 6.4 oz (136.714 kg)  07/01/14 300 lb (136.079 kg)  06/24/14 299 lb (135.626 kg)     Physical Exam  Constitutional: He is oriented to person, place, and time. He appears well-developed and well-nourished. No distress.  HENT:  Head: Normocephalic and atraumatic.  Right Ear: External ear normal.  Left Ear: External ear normal.  Nose: Nose normal.  Mouth/Throat: Oropharynx is clear and moist.  Eyes: Conjunctivae and EOM are normal. Pupils are equal, round, and reactive to light.  Neck: Normal range of motion. Neck supple. No thyromegaly present.  Cardiovascular: Normal rate, regular rhythm and normal heart sounds.   No murmur heard. Pulmonary/Chest: Effort normal and breath sounds normal. No respiratory distress. He has no wheezes. He has no rales.  Abdominal: Soft. Bowel sounds are normal. He exhibits no distension. There is no tenderness.  Musculoskeletal: He exhibits no tenderness.  Lymphadenopathy:    He has no cervical adenopathy.    Neurological: He is alert and oriented to person, place, and time. He has normal reflexes.  Skin: Skin is warm and dry.  Psychiatric: He has a normal mood and affect. His behavior is normal. Judgment and thought content normal.    Lab Results  Component Value Date   HGBA1C 7.4 08/06/2014   HGBA1C 7.5* 04/06/2014   HGBA1C 7.1 % 03/12/2014    Lab Results  Component Value Date   WBC 7.8 08/06/2014   HGB 12.9* 08/06/2014   HCT 40.8* 08/06/2014   PLT 157 04/07/2014   GLUCOSE 141* 08/06/2014   CHOL 138 08/06/2014   TRIG 88 08/06/2014   HDL 44 08/06/2014   LDLCALC 76 08/06/2014   ALT 16 08/06/2014   AST 20 08/06/2014   NA 140 08/06/2014   K 5.3* 08/06/2014   CL 100 08/06/2014   CREATININE 1.04 08/06/2014   BUN 18 08/06/2014   CO2 22 08/06/2014   TSH 0.902 04/28/2014   PSA 0.2 08/06/2014   HGBA1C 7.4 08/06/2014   MICROALBUR 1.50 04/16/2012    Dg Chest 1 View  04/06/2014   CLINICAL DATA:  Acute onset of severe generalized weakness, chills, urinary frequency. Postop ligament repair in the right knee 2 days ago. Current history of diabetes. Former smoker.  EXAM: CHEST  1 VIEW  COMPARISON:  03/14/2013 and earlier.  FINDINGS: Prior sternotomy. Cardiac silhouette upper normal in size to slightly enlarged for technique. Lungs clear.  Bronchovascular markings normal. Pulmonary vascularity normal. No visible pleural effusions. No pneumothorax. Stable mild elevation of the right hemidiaphragm.  IMPRESSION: Borderline to mild cardiomegaly.  No acute cardiopulmonary disease.   Electronically Signed   By: Evangeline Dakin M.D.   On: 04/06/2014 11:14    Assessment & Plan:   Cristopher was seen today for diabetes, hypertension, hyperlipidemia and anemia.  Diagnoses and all orders for this visit:  Hyperlipidemia with target LDL less than 100 Orders: -     CMP14+EGFR; Standing -     Lipid panel; Standing -     CMP14+EGFR -     Lipid panel -     AMB Referral to [place]  Management  Essential hypertension Orders: -     POCT CBC; Standing -     CMP14+EGFR; Standing -     POCT CBC -     CMP14+EGFR -     AMB Referral to [place] Management  DM type 2, not at goal Orders: -     POCT glycosylated hemoglobin (Hb A1C); Standing -     CMP14+EGFR; Standing -     POCT glycosylated hemoglobin (Hb A1C) -     CMP14+EGFR -     AMB Referral to [place] Management  Iron deficiency anemia Orders: -     POCT CBC; Standing -     POCT CBC -     AMB Referral to [place] Management  Screening Orders: -     PSA, total and free -     AMB Referral to [place] Management  Other orders -     insulin detemir (LEVEMIR) 100 UNIT/ML injection; Inject 0.9 mLs (90 Units total) into the skin 2 (two) times daily.   I have changed Mr. Labarge's insulin detemir. I am also having him maintain his Fish Oil, Multiple Vitamins-Minerals (MENS MULTI VITAMIN & MINERAL PO), calcium-vitamin D, furosemide, metFORMIN, acarbose, citalopram, aspirin EC, levothyroxine, cyanocobalamin, metoprolol tartrate, glipiZIDE, and simvastatin.  Meds ordered this encounter  Medications  . glipiZIDE (GLUCOTROL) 5 MG tablet    Sig:   . simvastatin (ZOCOR) 80 MG tablet    Sig:   . insulin detemir (LEVEMIR) 100 UNIT/ML injection    Sig: Inject 0.9 mLs (90 Units total) into the skin 2 (two) times daily.    Dispense:  60 mL    Refill:  11     Follow-up: Return in about 3 months (around 11/06/2014).  Claretta Fraise, M.D.

## 2014-08-07 ENCOUNTER — Other Ambulatory Visit: Payer: Self-pay | Admitting: Family Medicine

## 2014-08-07 ENCOUNTER — Telehealth: Payer: Self-pay | Admitting: *Deleted

## 2014-08-07 LAB — CMP14+EGFR
A/G RATIO: 1.5 (ref 1.1–2.5)
ALK PHOS: 53 IU/L (ref 39–117)
ALT: 16 IU/L (ref 0–44)
AST: 20 IU/L (ref 0–40)
Albumin: 3.8 g/dL (ref 3.5–4.8)
BUN / CREAT RATIO: 17 (ref 10–22)
BUN: 18 mg/dL (ref 8–27)
Bilirubin Total: 0.3 mg/dL (ref 0.0–1.2)
CO2: 22 mmol/L (ref 18–29)
Calcium: 9.1 mg/dL (ref 8.6–10.2)
Chloride: 100 mmol/L (ref 97–108)
Creatinine, Ser: 1.04 mg/dL (ref 0.76–1.27)
GFR calc Af Amer: 84 mL/min/{1.73_m2} (ref >59)
GFR, EST NON AFRICAN AMERICAN: 72 mL/min/{1.73_m2} (ref >59)
GLOBULIN, TOTAL: 2.6 g/dL (ref 1.5–4.5)
GLUCOSE: 141 mg/dL — AB (ref 65–99)
Potassium: 5.3 mmol/L — ABNORMAL HIGH (ref 3.5–5.2)
Sodium: 140 mmol/L (ref 134–144)
TOTAL PROTEIN: 6.4 g/dL (ref 6.0–8.5)

## 2014-08-07 LAB — LIPID PANEL
CHOLESTEROL TOTAL: 138 mg/dL (ref 100–199)
Chol/HDL Ratio: 3.1 ratio units (ref 0.0–5.0)
HDL: 44 mg/dL (ref >39)
LDL Calculated: 76 mg/dL (ref 0–99)
TRIGLYCERIDES: 88 mg/dL (ref 0–149)
VLDL Cholesterol Cal: 18 mg/dL (ref 5–40)

## 2014-08-07 LAB — PSA, TOTAL AND FREE
PROSTATE SPECIFIC AG, SERUM: 0.2 ng/mL (ref 0.0–4.0)
PSA FREE PCT: 50 %
PSA, Free: 0.1 ng/mL

## 2014-08-07 MED ORDER — LISINOPRIL 10 MG PO TABS: 10.0000 mg | ORAL_TABLET | Freq: Every day | ORAL | Status: DC

## 2014-08-07 NOTE — Telephone Encounter (Signed)
-----   Message from Claretta Fraise, MD sent at 08/07/2014  2:16 PM EDT ----- Potassium is high. Decrease lisinopril to 10 mg a day. Scrip for 10 mg strength sent in to pharmacy.

## 2014-08-07 NOTE — Telephone Encounter (Signed)
Pt notified of results Verbalizes understanding 

## 2014-08-11 ENCOUNTER — Other Ambulatory Visit: Payer: Self-pay | Admitting: *Deleted

## 2014-08-11 DIAGNOSIS — E1110 Type 2 diabetes mellitus with ketoacidosis without coma: Secondary | ICD-10-CM

## 2014-08-14 ENCOUNTER — Telehealth: Payer: Self-pay | Admitting: Family Medicine

## 2014-08-14 NOTE — Telephone Encounter (Signed)
Pt wanted copy of current med list List mailed to pt

## 2014-08-19 ENCOUNTER — Other Ambulatory Visit: Payer: Self-pay | Admitting: Family Medicine

## 2014-08-19 NOTE — Telephone Encounter (Signed)
Patient wanted a copy of his medication list.  Copy was mailed.

## 2014-09-01 DIAGNOSIS — N4883 Acquired buried penis: Secondary | ICD-10-CM | POA: Diagnosis not present

## 2014-09-02 ENCOUNTER — Telehealth: Payer: Self-pay | Admitting: Family Medicine

## 2014-09-02 NOTE — Telephone Encounter (Signed)
Last B12 was given on 07/01/14

## 2014-09-02 NOTE — Telephone Encounter (Signed)
No VM to leave mssg

## 2014-09-05 ENCOUNTER — Encounter: Payer: Self-pay | Admitting: *Deleted

## 2014-09-05 ENCOUNTER — Other Ambulatory Visit: Payer: Self-pay | Admitting: *Deleted

## 2014-09-05 DIAGNOSIS — N390 Urinary tract infection, site not specified: Secondary | ICD-10-CM | POA: Diagnosis not present

## 2014-09-05 DIAGNOSIS — R3915 Urgency of urination: Secondary | ICD-10-CM | POA: Diagnosis not present

## 2014-09-05 DIAGNOSIS — N4883 Acquired buried penis: Secondary | ICD-10-CM | POA: Diagnosis not present

## 2014-09-05 NOTE — Patient Outreach (Signed)
[facility]) Care Management  09/05/2014  [name] Jun 28, 1945 [id number]   MD referral: Telephone call to patient who was advised of reason for call and of [facility] care management services.   States he has diabetes and takes insulin.  States last hemoglobin A1c level was 7.2 and goal is 6.0.. Patient offered referral to RN health Coach for periodic telephone calls to help with disease management of his diabetic condition.   Patient voices that he does not need [facility] services at this time. States he has had diabetes for 40 years and feels he has good working knowledge of self care for his condition.   Plan: patient given contact information for [employer] care management services. Will close case and send MD closure letter.  [name], RN BSN [place] Management Coordinator [facility] Care Management  [phone]

## 2014-09-09 NOTE — Patient Outreach (Signed)
[facility]) Care Management  09/05/2014  [name] 18-Nov-1945 [id number]   Notification from [name], RN to close case due to patient refused [place] Management services.  Thanks, [name]. [place], [place] Assistant Phone: [phone] Fax: [phone]

## 2014-09-10 DIAGNOSIS — E1140 Type 2 diabetes mellitus with diabetic neuropathy, unspecified: Secondary | ICD-10-CM | POA: Diagnosis not present

## 2014-09-10 DIAGNOSIS — L110 Acquired keratosis follicularis: Secondary | ICD-10-CM | POA: Diagnosis not present

## 2014-09-10 DIAGNOSIS — E1151 Type 2 diabetes mellitus with diabetic peripheral angiopathy without gangrene: Secondary | ICD-10-CM | POA: Diagnosis not present

## 2014-09-10 DIAGNOSIS — L609 Nail disorder, unspecified: Secondary | ICD-10-CM | POA: Diagnosis not present

## 2014-09-17 ENCOUNTER — Encounter: Payer: Self-pay | Admitting: Cardiology

## 2014-09-17 ENCOUNTER — Other Ambulatory Visit (INDEPENDENT_AMBULATORY_CARE_PROVIDER_SITE_OTHER): Payer: Medicare Other

## 2014-09-17 ENCOUNTER — Ambulatory Visit (INDEPENDENT_AMBULATORY_CARE_PROVIDER_SITE_OTHER): Payer: Medicare Other | Admitting: Cardiology

## 2014-09-17 VITALS — BP 110/62 | HR 80 | Ht 73.0 in | Wt 302.0 lb

## 2014-09-17 DIAGNOSIS — I10 Essential (primary) hypertension: Secondary | ICD-10-CM

## 2014-09-17 DIAGNOSIS — R0602 Shortness of breath: Secondary | ICD-10-CM | POA: Diagnosis not present

## 2014-09-17 MED ORDER — FUROSEMIDE 20 MG PO TABS: 40.0000 mg | ORAL_TABLET | Freq: Every day | ORAL | Status: DC

## 2014-09-17 NOTE — Progress Notes (Signed)
HPI The patient presents for followup of aortic valve replacement. Since I last saw him he was hospitalized in the spring with urinary tract infection. I looked through these records. I don't see any acute cardiac issues at that time. He has good rehabilitation afterward. His biggest complaint is that he is very weak. He is short of breath but this seems to be chronic. He's not having any new PND or orthopnea. He's not having any chest pressure, neck or arm discomfort. He does describe some lightheadedness upon standing dizziness.   Allergies  Allergen Reactions  . Phenothiazines Anaphylaxis  . Pioglitazone Swelling    Current Outpatient Prescriptions  Medication Sig Dispense Refill  . acarbose (PRECOSE) 100 MG tablet TAKE ONE TABLET BY MOUTH TWICE DAILY 180 tablet 1  . aspirin EC 81 MG tablet Take 81 mg by mouth daily.    . calcium-vitamin D (OSCAL WITH D) 500-200 MG-UNIT per tablet Take 2 tablets by mouth 2 (two) times daily. (Patient taking differently: Take 1 tablet by mouth 2 (two) times daily. ) 60 tablet 1  . citalopram (CELEXA) 40 MG tablet Take 20 mg by mouth daily.    . cyanocobalamin (,VITAMIN B-12,) 1000 MCG/ML injection Inject 1 mL (1,000 mcg total) into the muscle every 30 (thirty) days. 1 mL 11  . furosemide (LASIX) 40 MG tablet Take 1 tablet (40 mg total) by mouth daily. 30 tablet 4  . glipiZIDE (GLUCOTROL) 5 MG tablet Take 5 mg by mouth 2 (two) times daily before a meal.     . insulin detemir (LEVEMIR) 100 UNIT/ML injection Inject 0.9 mLs (90 Units total) into the skin 2 (two) times daily. 60 mL 11  . levothyroxine (SYNTHROID, LEVOTHROID) 200 MCG tablet Take 1 tablet (200 mcg total) by mouth daily before breakfast. 90 tablet 3  . lisinopril (PRINIVIL,ZESTRIL) 10 MG tablet Take 1 tablet (10 mg total) by mouth daily. 30 tablet 2  . metFORMIN (GLUCOPHAGE) 1000 MG tablet Take 1 tablet (1,000 mg total) by mouth 2 (two) times daily with a meal. 180 tablet 3  . metoprolol tartrate  (LOPRESSOR) 25 MG tablet TAKE ONE-HALF TABLET BY MOUTH TWICE DAILY 60 tablet 5  . Multiple Vitamins-Minerals (MENS MULTI VITAMIN & MINERAL PO) Take 1 tablet by mouth daily.      . Omega-3 Fatty Acids (FISH OIL) 1200 MG CAPS Take 1-2 capsules by mouth 2 (two) times daily. 1 capsule by mouth q am, 2 capsules by mouth q pm    . simvastatin (ZOCOR) 80 MG tablet Take 80 mg by mouth at bedtime.      No current facility-administered medications for this visit.    Past Medical History  Diagnosis Date  . 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 BPH (benign prostatic hypertrophy) 04/15/2010  . DEPRESSION 01/22/2009  . HYPERCHOLESTEROLEMIA 01/22/2009  . 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 Morbid obesity   . Heart valve replaced   . Colon polyps   . Ulcer   . Heart murmur   . HYPERTENSION 01/22/2009    dr Casimer Lanius  . Arthritis   . Tremors of nervous system     ?ptsd    Past Surgical History  Procedure Laterality Date  . Appendectomy    . Aortic valve replacement  July 2006    #20 stentless [place] porcine valve  . Dental surgery  05/2004    Dental extractions  . Heel spur surgery Bilateral     resection of heel spur  . Colonoscopy  04/24/2007    [name]:  normal  . Tonsillectomy    . Bilateral cataract surg    . Colonoscopy with esophagogastroduodenoscopy (egd) N/A 04/05/2012    Procedure: COLONOSCOPY WITH ESOPHAGOGASTRODUODENOSCOPY (EGD);  Surgeon: Daneil Dolin, MD;  Location: [facility];  Service: Endoscopy;  Laterality: N/A;  10;15  . Thyroidectomy  03/21/2013    DR [name]  . Thyroidectomy N/A 03/21/2013    Procedure: THYROIDECTOMY;  Surgeon: [name], MD;  Location: [facility];  Service: General;  Laterality: N/A;  . Knee arthroscopy with lateral menisectomy Right 04/04/2014    Procedure: KNEE ARTHROSCOPY WITH LATERAL MENISECTOMY;  Surgeon: Carole Civil, MD;  Location: [facility];  Service: Orthopedics;  Laterality: Right;   ROS:   As stated in the HPI and negative for all other  systems.  PHYSICAL EXAM BP 110/62 mmHg  Pulse 80  Ht 6\' 1"  (1.854 m)  Wt 302 lb (136.986 kg)  BMI 39.85 kg/m2 GENERAL:  No acute distress NECK:  No jugular venous distention, waveform within normal limits, carotid upstroke brisk and symmetric, no bruits, no thyromegaly LUNGS:  Clear to auscultation bilaterally CHEST:  Well healed sternotomy scar. HEART:  PMI not displaced or sustained,S1 and S2 within normal limits, no S3, no S4 no clicks, no rubs, systolic murmur mid peaking and  radiating into the carotids  ABD:  Flat, positive bowel sounds normal in frequency in pitch, no bruits, no rebound, no guarding, no midline pulsatile mass, no hepatomegaly, no splenomegaly, obese EXT:  2 plus pulses throughout, trace edema, no cyanosis no clubbing NEURO:  Nonfocal    ASSESSMENT AND PLAN  WEAKNESS:   I suspect this is multifactorial. He does have some symptoms consistent with orthostasis. I'm going to slightly reduce his diuretic but we discussed when to take the full dose if he has any symptoms or edema or weight gain. I will be checking a BNP level given the dyspnea as well.  AORTIC VALVE REPLACEMENT, HX OF - His echocardiogram earlier this year demonstrated porous acoustic windows but no evidence of valvular dysfunction. Again I will be checking a BNP level.  HYPERTENSION -  His blood pressure was actually running low. He did have his lisinopril reduced several weeks ago. If his blood pressure continues to run low on the reduced dose of diuretic I will manage this further.   CAROTID OCCLUSIVE DISEASE -  He has less than 50% plaque on the right.  I will schedule him for follow up when I see him next.     Hospital charts reviewed.

## 2014-09-17 NOTE — Progress Notes (Signed)
Lab only for Dr [name] BNP Dx: R06.02

## 2014-09-17 NOTE — Patient Instructions (Signed)
Medication Instructions:  Please decrease Furosemide to 20 mg a day. Continue all other medications as listed.  Labwork: Please have blood work today at [facility]  (BNP)  Follow-Up: Follow up in 4 months with Dr [name].  Thank you for choosing [facility]!!

## 2014-09-18 LAB — BRAIN NATRIURETIC PEPTIDE: BNP: 119.5 pg/mL — AB (ref 0.0–100.0)

## 2014-09-22 ENCOUNTER — Other Ambulatory Visit: Payer: Self-pay | Admitting: Nurse Practitioner

## 2014-09-23 ENCOUNTER — Telehealth: Payer: Self-pay | Admitting: Orthopedic Surgery

## 2014-09-23 NOTE — Telephone Encounter (Signed)
Patient is asking if the nurse could please call him he has questions about his Right Leg at surgery site.

## 2014-09-24 NOTE — Telephone Encounter (Signed)
PATIENT STATES HE IS HAVING PAIN WITH OPERATIVE KNEE, ASKING FOR XRAY, ADVISED PATIENT NEEDS APPT FOR EVAL AND HE STATES HE WILL CALL BACK

## 2014-10-02 DIAGNOSIS — F329 Major depressive disorder, single episode, unspecified: Secondary | ICD-10-CM | POA: Diagnosis not present

## 2014-10-02 DIAGNOSIS — Z7982 Long term (current) use of aspirin: Secondary | ICD-10-CM | POA: Diagnosis not present

## 2014-10-02 DIAGNOSIS — R0789 Other chest pain: Secondary | ICD-10-CM | POA: Diagnosis not present

## 2014-10-02 DIAGNOSIS — Z794 Long term (current) use of insulin: Secondary | ICD-10-CM | POA: Diagnosis not present

## 2014-10-02 DIAGNOSIS — R079 Chest pain, unspecified: Secondary | ICD-10-CM | POA: Diagnosis not present

## 2014-10-02 DIAGNOSIS — E780 Pure hypercholesterolemia: Secondary | ICD-10-CM | POA: Diagnosis not present

## 2014-10-02 DIAGNOSIS — E118 Type 2 diabetes mellitus with unspecified complications: Secondary | ICD-10-CM | POA: Diagnosis not present

## 2014-10-02 DIAGNOSIS — R0602 Shortness of breath: Secondary | ICD-10-CM | POA: Diagnosis not present

## 2014-10-02 DIAGNOSIS — Z952 Presence of prosthetic heart valve: Secondary | ICD-10-CM | POA: Diagnosis not present

## 2014-10-03 DIAGNOSIS — R0789 Other chest pain: Secondary | ICD-10-CM | POA: Diagnosis not present

## 2014-10-03 DIAGNOSIS — E118 Type 2 diabetes mellitus with unspecified complications: Secondary | ICD-10-CM | POA: Diagnosis not present

## 2014-10-03 DIAGNOSIS — Z952 Presence of prosthetic heart valve: Secondary | ICD-10-CM | POA: Diagnosis not present

## 2014-10-07 ENCOUNTER — Ambulatory Visit: Payer: Medicare Other | Admitting: Family Medicine

## 2014-10-11 ENCOUNTER — Encounter: Payer: Medicare Other | Admitting: *Deleted

## 2014-10-13 ENCOUNTER — Telehealth: Payer: Self-pay | Admitting: Family Medicine

## 2014-10-13 NOTE — Telephone Encounter (Signed)
Spoke with pt regarding B12 injections informed pt that in order for inj to help he will need to be consistent with inj and should get them from the same facility Pt verbalizes understanding

## 2014-10-14 ENCOUNTER — Ambulatory Visit (INDEPENDENT_AMBULATORY_CARE_PROVIDER_SITE_OTHER): Payer: Medicare Other | Admitting: *Deleted

## 2014-10-14 DIAGNOSIS — E538 Deficiency of other specified B group vitamins: Secondary | ICD-10-CM

## 2014-10-14 MED ORDER — CYANOCOBALAMIN 1000 MCG/ML IJ SOLN
1000.0000 ug | Freq: Once | INTRAMUSCULAR | Status: AC
  Administered 2014-10-14: 1000 ug via INTRAMUSCULAR

## 2014-10-14 NOTE — Patient Instructions (Signed)

## 2014-10-14 NOTE — Progress Notes (Signed)
Pt given B12 injection IM right deltoid and tolerated well. 

## 2014-10-17 ENCOUNTER — Telehealth: Payer: Self-pay | Admitting: Cardiology

## 2014-10-17 NOTE — Telephone Encounter (Signed)
Returned call to patient, clarified recommended dosing of furosemide from last OV. Pt was taking correct dose, just wanted to confirm.

## 2014-10-17 NOTE — Telephone Encounter (Signed)
Pt called in wanting to speak with the nurse about his dosage on his Furosemide prescription. Please f/u with the pt  Thanks

## 2014-10-20 DIAGNOSIS — N4883 Acquired buried penis: Secondary | ICD-10-CM | POA: Diagnosis not present

## 2014-10-20 DIAGNOSIS — R300 Dysuria: Secondary | ICD-10-CM | POA: Diagnosis not present

## 2014-10-21 ENCOUNTER — Telehealth: Payer: Self-pay | Admitting: Family Medicine

## 2014-10-21 MED ORDER — GLUCOSE BLOOD VI STRP
ORAL_STRIP | Status: DC
Start: 2014-10-21 — End: 2014-10-22

## 2014-10-21 NOTE — Telephone Encounter (Signed)
rx for test strips sent into [facility]

## 2014-10-22 ENCOUNTER — Other Ambulatory Visit: Payer: Self-pay | Admitting: *Deleted

## 2014-10-22 ENCOUNTER — Telehealth: Payer: Self-pay | Admitting: Family Medicine

## 2014-10-22 MED ORDER — GLUCOSE BLOOD VI STRP: ORAL_STRIP | Status: DC

## 2014-10-22 NOTE — Telephone Encounter (Signed)
RX for test strips sent into [facility] per Dr Livia Snellen

## 2014-10-22 NOTE — Progress Notes (Signed)
Refill on test strips requested to [facility] per Dr Livia Snellen

## 2014-10-22 NOTE — Telephone Encounter (Signed)
done

## 2014-10-28 ENCOUNTER — Ambulatory Visit (INDEPENDENT_AMBULATORY_CARE_PROVIDER_SITE_OTHER): Payer: Medicare Other | Admitting: Orthopedic Surgery

## 2014-10-28 ENCOUNTER — Encounter: Payer: Self-pay | Admitting: Orthopedic Surgery

## 2014-10-28 VITALS — BP 131/45 | Ht 73.0 in | Wt 302.0 lb

## 2014-10-28 DIAGNOSIS — M5126 Other intervertebral disc displacement, lumbar region: Secondary | ICD-10-CM | POA: Diagnosis not present

## 2014-10-28 NOTE — Patient Instructions (Signed)
We will schedule MRI for you and call you with appt and results 

## 2014-10-28 NOTE — Progress Notes (Signed)
Patient ID: [name], male   DOB: Sep 13, 1945, 69 y.o.   MRN: [id number]  Follow up visit  Chief Complaint  Patient presents with  . Follow-up    Recheck right knee, SARK, DOS 04-04-14.    BP 131/45 mmHg  Ht 6\' 1"  (1.854 m)  Wt 302 lb (136.986 kg)  BMI 39.85 kg/m2  Encounter Diagnosis  Name Primary?  . Lumbar herniated disc Yes    The patient had a right knee arthroscopy lateral meniscectomy did well except since surgery he complains of weakness in his right leg and pain in his lateral lower leg with decreased sensation in the dorsum of his right foot  No prominent  Bowel and bladder function remain intact  His knee is looking very good is a portals healed nicely has no effusion is knee remains emotional return to normal he has weakness in his right foot dorsiflexor he has decreased sensation on the dorsum of the foot his knee remained stable pulses remain intact  X-rays show degenerative disc disease which she's had for several years previously seen by Dr. Carloyn Manner told he had arthritis in his back  Recommend MRI L-spine to image the L4-5 disc space and then perform or at least referred for epidural injection and return to Dr. Carloyn Manner if needed  Call results

## 2014-10-31 ENCOUNTER — Other Ambulatory Visit: Payer: Self-pay | Admitting: Family Medicine

## 2014-11-04 ENCOUNTER — Other Ambulatory Visit: Payer: Self-pay | Admitting: Family Medicine

## 2014-11-05 ENCOUNTER — Telehealth: Payer: Self-pay | Admitting: Family Medicine
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
## 2014-11-07 ENCOUNTER — Telehealth: Payer: Self-pay | Admitting: Family Medicine

## 2014-11-07 NOTE — Telephone Encounter (Signed)
Spoke with patient and appointment given for in the am 10/15 to see if he has a UTI.

## 2014-11-08 ENCOUNTER — Encounter: Payer: Self-pay | Admitting: Pediatrics

## 2014-11-08 ENCOUNTER — Ambulatory Visit (INDEPENDENT_AMBULATORY_CARE_PROVIDER_SITE_OTHER): Payer: Medicare Other | Admitting: Pediatrics

## 2014-11-08 VITALS — BP 130/65 | HR 69 | Temp 97.1°F | Ht 73.0 in | Wt 310.4 lb

## 2014-11-08 DIAGNOSIS — R739 Hyperglycemia, unspecified: Secondary | ICD-10-CM | POA: Diagnosis not present

## 2014-11-08 DIAGNOSIS — N390 Urinary tract infection, site not specified: Secondary | ICD-10-CM

## 2014-11-08 DIAGNOSIS — R399 Unspecified symptoms and signs involving the genitourinary system: Secondary | ICD-10-CM | POA: Diagnosis not present

## 2014-11-08 DIAGNOSIS — R443 Hallucinations, unspecified: Secondary | ICD-10-CM | POA: Diagnosis not present

## 2014-11-08 DIAGNOSIS — R8281 Pyuria: Secondary | ICD-10-CM

## 2014-11-08 LAB — POCT UA - MICROSCOPIC ONLY
Casts, Ur, LPF, POC: NEGATIVE
Crystals, Ur, HPF, POC: NEGATIVE
MUCUS UA: NEGATIVE
Yeast, UA: NEGATIVE

## 2014-11-08 LAB — POCT URINALYSIS DIPSTICK
Bilirubin, UA: NEGATIVE
GLUCOSE UA: NEGATIVE
Ketones, UA: NEGATIVE
NITRITE UA: NEGATIVE
PROTEIN UA: NEGATIVE
Spec Grav, UA: 1.01
UROBILINOGEN UA: NEGATIVE
pH, UA: 5

## 2014-11-08 LAB — GLUCOSE, POCT (MANUAL RESULT ENTRY): POC Glucose: 125 mg/dl — AB (ref 70–99)

## 2014-11-08 MED ORDER — SULFAMETHOXAZOLE-TRIMETHOPRIM 800-160 MG PO TABS
1.0000 | ORAL_TABLET | Freq: Two times a day (BID) | ORAL | Status: DC
Start: 2014-11-08 — End: 2014-12-01

## 2014-11-08 MED ORDER — CEFTRIAXONE SODIUM 1 G IJ SOLR
1.0000 g | Freq: Once | INTRAMUSCULAR | Status: AC
  Administered 2014-11-08: 1 g via INTRAMUSCULAR

## 2014-11-08 NOTE — Progress Notes (Signed)
Patient tolerated injection well.

## 2014-11-08 NOTE — Progress Notes (Signed)
Subjective:    Patient ID: [name], male    DOB: 1946/01/12, 69 y.o.   MRN: [id number]  CC: hallucinations  HPI: [name] is a 69 y.o. male presenting on 11/08/2014 for Hallucinations  Ongoing occasionally for past three weeks.  Has woken up twice to a man beside his bed then he turns on the light and the man disappears. One time the man talked to him, he isnt sure what he said.  He called the police because he thought there was a man smoking under his house, but no one was there.  He has not heard any voices in his head. Has never had this before Former smoker, over 40 years. Used marijuana in 1960s, none recently. No other drugs, EtOH or substances other than his prescribed medicines. Has had 4 UTIs since knee replacement in May 2016. Usually has burning when he has an infection Just treated 3 weeks ago for UTI by urologist Called yesterday worried he had a UTI because he was told the hallucinations could be a symptom of infection. He has not had any dysuria, frequency or urgency. He has not had any trouble emptying his bladder since he was started on a medicine for his prostate by his urologist several weeks ago, he is not sure what that medicine is. Has been "running hot on his face" for a couple of days, not sure if having any fevers No headaches, no vision changes, thinks he is due for new glasses but that has been ongoing over months No weakness one side of body or other, no slurred speech. No chest pain.  Has been on celexa for some years, feels his mood symptoms are very well controlled. No recent depressed mood, has been doing his normal activities. No family history of hallucinations that he knows of.   Relevant past medical, surgical, family and social history reviewed and updated as indicated. Interim medical history since our last visit reviewed. Allergies and medications reviewed and updated.  ROS: Per HPI unless specifically indicated above  Past  Medical History Patient Active Problem List   Diagnosis Date Noted  . B12 deficiency 04/08/2014  . UTI (lower urinary tract infection) 04/06/2014  . Generalized weakness 04/06/2014  . Lateral meniscal tear   . DM type 2, not at goal [facility]) 03/12/2014  . 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 GERD (gastroesophageal reflux disease) 11/02/2012  . Anemia 04/02/2012  . Melena 04/02/2012  . Dupuytren's contracture of both hands 02/29/2012  . Obesity 08/04/2010  . BPH (benign prostatic hypertrophy) 04/15/2010  . CAROTID OCCLUSIVE DISEASE 01/26/2010  . MURMUR 05/27/2009  . Aortic valve disease 05/27/2009  . Hyperlipidemia with target LDL less than 100 01/22/2009  . Depression 01/22/2009  . Essential hypertension 01/22/2009    Current Outpatient Prescriptions  Medication Sig Dispense Refill  . acarbose (PRECOSE) 100 MG tablet TAKE ONE TABLET BY MOUTH TWICE DAILY 180 tablet 0  . aspirin EC 81 MG tablet Take 81 mg by mouth daily.    . calcium-vitamin D (OSCAL WITH D) 500-200 MG-UNIT per tablet Take 2 tablets by mouth 2 (two) times daily. (Patient taking differently: Take 1 tablet by mouth 2 (two) times daily. ) 60 tablet 1  . citalopram (CELEXA) 40 MG tablet Take 20 mg by mouth daily.    . cyanocobalamin (,VITAMIN B-12,) 1000 MCG/ML injection Inject 1 mL (1,000 mcg total) into the muscle every 30 (thirty) days. 1 mL 11  . furosemide (LASIX) 20 MG tablet Take 2  tablets (40 mg total) by mouth daily. 30 tablet 4  . glipiZIDE (GLUCOTROL) 5 MG tablet Take 5 mg by mouth 2 (two) times daily before a meal.     . glipiZIDE (GLUCOTROL) 5 MG tablet TAKE ONE TABLET BY MOUTH TWICE DAILY BEFORE MEAL(S) 180 tablet 0  . glucose blood test strip Test blood sugar bid. DX E11.9 100 each 12  . insulin detemir (LEVEMIR) 100 UNIT/ML injection Inject 0.9 mLs (90 Units total) into the skin 2 (two) times daily. 60 mL 11  . levothyroxine (SYNTHROID, LEVOTHROID) 200 MCG tablet Take 1 tablet (200 mcg total) by mouth  daily before breakfast. 90 tablet 3  . lisinopril (PRINIVIL,ZESTRIL) 10 MG tablet Take 1 tablet (10 mg total) by mouth daily. 30 tablet 2  . metFORMIN (GLUCOPHAGE) 1000 MG tablet Take 1 tablet (1,000 mg total) by mouth 2 (two) times daily with a meal. 180 tablet 3  . metoprolol tartrate (LOPRESSOR) 25 MG tablet TAKE ONE-HALF TABLET BY MOUTH TWICE DAILY 60 tablet 5  . Multiple Vitamins-Minerals (MENS MULTI VITAMIN & MINERAL PO) Take 1 tablet by mouth daily.      . Omega-3 Fatty Acids (FISH OIL) 1200 MG CAPS Take 1-2 capsules by mouth 2 (two) times daily. 1 capsule by mouth q am, 2 capsules by mouth q pm    . simvastatin (ZOCOR) 80 MG tablet Take 80 mg by mouth at bedtime.     . sulfamethoxazole-trimethoprim (BACTRIM DS) 800-160 MG tablet Take 1 tablet by mouth 2 (two) times daily. 14 tablet 0   No current facility-administered medications for this visit.       Objective:    BP 130/65 mmHg  Pulse 69  Temp(Src) 97.1 F (36.2 C) (Oral)  Ht _0  (1.854 m)  Wt 310 lb 6.4 oz (140.797 kg)  BMI 40.96 kg/m2  Wt Readings from Last 3 Encounters:  11/08/14 310 lb 6.4 oz (140.797 kg)  10/28/14 302 lb (136.986 kg)  09/17/14 302 lb (136.986 kg)     Gen: NAD, alert, cooperative with exam, NCAT EYES: EOMI, no scleral injection or icterus ENT:  TMs pearly gray b/l, OP crowded, without erythema LYMPH: no cervical LAD CV: NRRR, normal V4/M0, systolic ejection murmur at RUSB, distal pulses 2+ b/l Resp: CTABL, no wheezes, normal WOB Abd: +BS, soft, NTND. no guarding or organomegaly. No CVA tenderness Ext: No edema, warm Neuro: Alert and oriented to person, place and time, strength equal b/l UE and LE, sensation intact, speech normal     Assessment & Plan:   Jeris was seen today for hallucinations. Diabetes has been fairly well controlled last HgA1c 7.4. Ddx includes infection, he does have pyruia on UA today, metabolic cause including elevated CO2 levels from sleep apnea, he has never had sleep  apnea test and body habitus and history of snoring does put him at risk for OSA or obesity hypoventilation syndrome, and medications including recent antibiotics for UTI. Not currently on any obvious medications that cause hallucinations now. As he does have pyuria on his UA,  will go ahead and treat with 1gm CTX IM, bactrim BID until we get culture data back. Will also get a CBC, BMP. Pt feels safe at home, will call if symptoms getting worse. He already has f/u appt in two days.  Diagnoses and all orders for this visit:  UTI symptoms -     POCT urinalysis dipstick -     POCT UA - Microscopic Only  Hallucination -  POCT glucose (manual entry) -     CBC with Differential -     BMP8+EGFR  Hyperglycemia -     POCT glucose (manual entry)  Pyuria -     Urine culture -     sulfamethoxazole-trimethoprim (BACTRIM DS) 800-160 MG tablet; Take 1 tablet by mouth 2 (two) times daily.  Follow up plan: Return as scheduled on Monday.  Assunta Found, MD [facility] 11/08/2014, 9:41 AM

## 2014-11-09 LAB — BMP8+EGFR
BUN/Creatinine Ratio: 22 (ref 10–22)
BUN: 26 mg/dL (ref 8–27)
CO2: 25 mmol/L (ref 18–29)
Calcium: 9.1 mg/dL (ref 8.6–10.2)
Chloride: 98 mmol/L (ref 97–108)
Creatinine, Ser: 1.17 mg/dL (ref 0.76–1.27)
GFR calc Af Amer: 72 mL/min/{1.73_m2} (ref >59)
GFR calc non Af Amer: 62 mL/min/{1.73_m2} (ref >59)
GLUCOSE: 118 mg/dL — AB (ref 65–99)
POTASSIUM: 5.2 mmol/L (ref 3.5–5.2)
SODIUM: 140 mmol/L (ref 134–144)

## 2014-11-09 LAB — CBC WITH DIFFERENTIAL/PLATELET
Basophils Absolute: 0 10*3/uL (ref 0.0–0.2)
Basos: 0 %
EOS (ABSOLUTE): 0.2 10*3/uL (ref 0.0–0.4)
Eos: 3 %
Hematocrit: 38.7 % (ref 37.5–51.0)
Hemoglobin: 12.2 g/dL — ABNORMAL LOW (ref 12.6–17.7)
IMMATURE GRANS (ABS): 0 10*3/uL (ref 0.0–0.1)
IMMATURE GRANULOCYTES: 0 %
LYMPHS: 23 %
Lymphocytes Absolute: 1.7 10*3/uL (ref 0.7–3.1)
MCH: 31 pg (ref 26.6–33.0)
MCHC: 31.5 g/dL (ref 31.5–35.7)
MCV: 98 fL — ABNORMAL HIGH (ref 79–97)
MONOS ABS: 0.6 10*3/uL (ref 0.1–0.9)
Monocytes: 8 %
NEUTROS PCT: 66 %
Neutrophils Absolute: 4.9 10*3/uL (ref 1.4–7.0)
PLATELETS: 173 10*3/uL (ref 150–379)
RBC: 3.94 x10E6/uL — AB (ref 4.14–5.80)
RDW: 15.7 % — AB (ref 12.3–15.4)
WBC: 7.5 10*3/uL (ref 3.4–10.8)

## 2014-11-10 ENCOUNTER — Encounter: Payer: Self-pay | Admitting: Family Medicine

## 2014-11-10 ENCOUNTER — Ambulatory Visit (INDEPENDENT_AMBULATORY_CARE_PROVIDER_SITE_OTHER): Payer: Medicare Other | Admitting: Family Medicine

## 2014-11-10 VITALS — BP 106/62 | HR 67 | Temp 97.0°F | Ht 73.0 in | Wt 308.2 lb

## 2014-11-10 DIAGNOSIS — E119 Type 2 diabetes mellitus without complications: Secondary | ICD-10-CM | POA: Diagnosis not present

## 2014-11-10 DIAGNOSIS — I10 Essential (primary) hypertension: Secondary | ICD-10-CM

## 2014-11-10 DIAGNOSIS — R0683 Snoring: Secondary | ICD-10-CM

## 2014-11-10 DIAGNOSIS — E785 Hyperlipidemia, unspecified: Secondary | ICD-10-CM

## 2014-11-10 DIAGNOSIS — Z23 Encounter for immunization: Secondary | ICD-10-CM | POA: Diagnosis not present

## 2014-11-10 DIAGNOSIS — D509 Iron deficiency anemia, unspecified: Secondary | ICD-10-CM | POA: Diagnosis not present

## 2014-11-10 LAB — POCT GLYCOSYLATED HEMOGLOBIN (HGB A1C): Hemoglobin A1C: 6.4

## 2014-11-10 LAB — URINE CULTURE: ORGANISM ID, BACTERIA: NO GROWTH

## 2014-11-10 NOTE — Progress Notes (Signed)
Subjective:  Patient ID: [name], male    DOB: [birth year]  Age: 69 y.o. MRN: [id number]  CC: Diabetes; Hyperlipidemia; Hypertension; and Hypothyroidism   HPI [name] presents for  follow-up of hypertension. Patient has no history of headache chest pain or shortness of breath or recent cough. Patient also denies symptoms of TIA such as numbness weakness lateralizing. Patient checks  blood pressure at home and has not had any elevated readings recently. Patient denies side effects from his medication. States taking it regularly.  Patient also  in for follow-up of elevated cholesterol. Doing well without complaints on current medication. Denies side effects of statin including myalgia and arthralgia and nausea. Also in today for liver function testing. Currently no chest pain, shortness of breath or other cardiovascular related symptoms noted.  Follow-up of diabetes. Patient does check blood sugar at home. Readings run between 78 and 123 Rarely higher Patient denies symptoms such as polyuria, polydipsia, excessive hunger, nausea No significant hypoglycemic spells noted. Had fasting of 56. Had sugar. Was shaking real bad. Relief with sugar. Medications as noted below. Taking them regularly without complication/adverse reaction being reported today.   Pt. Snoring. Wonders if adenoids causing.   History [name] has a past medical history of IDDM (01/22/2009); BPH (benign prostatic hypertrophy) (04/15/2010); DEPRESSION (01/22/2009); HYPERCHOLESTEROLEMIA (01/22/2009); HYPOTHYROIDISM, POST-RADIATION (01/22/2009); Morbid obesity ([place]); Heart valve replaced; Colon polyps; Ulcer; Heart murmur; HYPERTENSION (01/22/2009); Arthritis; and Tremors of nervous system.   He has past surgical history that includes Appendectomy; Aortic valve replacement (July 2006); Dental surgery (05/2004); Heel spur surgery (Bilateral); Colonoscopy (04/24/2007); Tonsillectomy; bilateral cataract surg; Colonoscopy  with esophagogastroduodenoscopy (egd) (N/A, 04/05/2012); Thyroidectomy (03/21/2013); Thyroidectomy (N/A, 03/21/2013); and Knee arthroscopy with lateral menisectomy (Right, 04/04/2014).   His family history includes Diabetes in an other family member.He reports that he quit smoking about 53 years ago. His smoking use included Cigarettes. He has a 6 pack-year smoking history. He has never used smokeless tobacco. He reports that he does not drink alcohol or use illicit drugs.  Current Outpatient Prescriptions on File Prior to Visit  Medication Sig Dispense Refill  . acarbose (PRECOSE) 100 MG tablet TAKE ONE TABLET BY MOUTH TWICE DAILY 180 tablet 0  . aspirin EC 81 MG tablet Take 81 mg by mouth daily.    . calcium-vitamin D (OSCAL WITH D) 500-200 MG-UNIT per tablet Take 2 tablets by mouth 2 (two) times daily. (Patient taking differently: Take 1 tablet by mouth 2 (two) times daily. ) 60 tablet 1  . citalopram (CELEXA) 40 MG tablet Take 20 mg by mouth daily.    . cyanocobalamin (,VITAMIN B-12,) 1000 MCG/ML injection Inject 1 mL (1,000 mcg total) into the muscle every 30 (thirty) days. 1 mL 11  . furosemide (LASIX) 20 MG tablet Take 2 tablets (40 mg total) by mouth daily. 30 tablet 4  . glipiZIDE (GLUCOTROL) 5 MG tablet Take 5 mg by mouth 2 (two) times daily before a meal.     . glucose blood test strip Test blood sugar bid. DX E11.9 100 each 12  . insulin detemir (LEVEMIR) 100 UNIT/ML injection Inject 0.9 mLs (90 Units total) into the skin 2 (two) times daily. 60 mL 11  . levothyroxine (SYNTHROID, LEVOTHROID) 200 MCG tablet Take 1 tablet (200 mcg total) by mouth daily before breakfast. 90 tablet 3  . lisinopril (PRINIVIL,ZESTRIL) 10 MG tablet Take 1 tablet (10 mg total) by mouth daily. 30 tablet 2  . metFORMIN (GLUCOPHAGE) 1000 MG tablet Take 1  tablet (1,000 mg total) by mouth 2 (two) times daily with a meal. 180 tablet 3  . metoprolol tartrate (LOPRESSOR) 25 MG tablet TAKE ONE-HALF TABLET BY MOUTH TWICE  DAILY 60 tablet 5  . Multiple Vitamins-Minerals (MENS MULTI VITAMIN & MINERAL PO) Take 1 tablet by mouth daily.      . Omega-3 Fatty Acids (FISH OIL) 1200 MG CAPS Take 1-2 capsules by mouth 2 (two) times daily. 1 capsule by mouth q am, 2 capsules by mouth q pm    . simvastatin (ZOCOR) 80 MG tablet Take 80 mg by mouth at bedtime.     . sulfamethoxazole-trimethoprim (BACTRIM DS) 800-160 MG tablet Take 1 tablet by mouth 2 (two) times daily. 14 tablet 0   No current facility-administered medications on file prior to visit.    ROS Review of Systems  Constitutional: Negative for fever, chills and diaphoresis.  HENT: Negative for congestion, rhinorrhea and sore throat.   Respiratory: Negative for cough, shortness of breath and wheezing.   Cardiovascular: Negative for chest pain.  Gastrointestinal: Negative for nausea, vomiting, abdominal pain, diarrhea, constipation and abdominal distention.  Genitourinary: Negative for dysuria and frequency.  Musculoskeletal: Negative for joint swelling and arthralgias.  Skin: Negative for rash.  Neurological: Negative for headaches.    Objective:  BP 106/62 mmHg  Pulse 67  Temp(Src) 97 F (36.1 C) (Oral)  Ht '6\' 1"'  (1.854 m)  Wt 308 lb 3.2 oz (139.799 kg)  BMI 40.67 kg/m2  BP Readings from Last 3 Encounters:  11/10/14 106/62  11/08/14 130/65  10/28/14 131/45    Wt Readings from Last 3 Encounters:  11/10/14 308 lb 3.2 oz (139.799 kg)  11/08/14 310 lb 6.4 oz (140.797 kg)  10/28/14 302 lb (136.986 kg)     Physical Exam  Constitutional: He is oriented to person, place, and time. He appears well-developed and well-nourished. No distress.  HENT:  Head: Normocephalic and atraumatic.  Right Ear: External ear normal.  Left Ear: External ear normal.  Nose: Nose normal.  Mouth/Throat: Oropharynx is clear and moist.  Eyes: Conjunctivae and EOM are normal. Pupils are equal, round, and reactive to light.  Neck: Normal range of motion. Neck supple. No  thyromegaly present.  Cardiovascular: Normal rate, regular rhythm and normal heart sounds.   No murmur heard. Pulmonary/Chest: Effort normal and breath sounds normal. No respiratory distress. He has no wheezes. He has no rales.  Abdominal: Soft. Bowel sounds are normal. He exhibits no distension. There is no tenderness.  Lymphadenopathy:    He has no cervical adenopathy.  Neurological: He is alert and oriented to person, place, and time. He has normal reflexes.  Skin: Skin is warm and dry.  Psychiatric: He has a normal mood and affect. His behavior is normal. Judgment and thought content normal.    Lab Results  Component Value Date   HGBA1C 6.4 11/10/2014   HGBA1C 7.4 08/06/2014   HGBA1C 7.5* 04/06/2014    Lab Results  Component Value Date   WBC 7.5 11/08/2014   HGB 12.9* 08/06/2014   HCT 38.7 11/08/2014   PLT 157 04/07/2014   GLUCOSE 118* 11/08/2014   CHOL 138 08/06/2014   TRIG 88 08/06/2014   HDL 44 08/06/2014   LDLCALC 76 08/06/2014   ALT 16 08/06/2014   AST 20 08/06/2014   NA 140 11/08/2014   K 5.2 11/08/2014   CL 98 11/08/2014   CREATININE 1.17 11/08/2014   BUN 26 11/08/2014   CO2 25 11/08/2014   TSH 0.902  04/28/2014   PSA 0.2 08/06/2014   HGBA1C 6.4 11/10/2014   MICROALBUR 1.50 04/16/2012    Dg Chest 1 View  04/06/2014  CLINICAL DATA:  Acute onset of severe generalized weakness, chills, urinary frequency. Postop ligament repair in the right knee 2 days ago. Current history of diabetes. Former smoker. EXAM: CHEST  1 VIEW COMPARISON:  03/14/2013 and earlier. FINDINGS: Prior sternotomy. Cardiac silhouette upper normal in size to slightly enlarged for technique. Lungs clear. Bronchovascular markings normal. Pulmonary vascularity normal. No visible pleural effusions. No pneumothorax. Stable mild elevation of the right hemidiaphragm. IMPRESSION: Borderline to mild cardiomegaly.  No acute cardiopulmonary disease. Electronically Signed   By: Evangeline Dakin M.D.   On:  04/06/2014 11:14    Assessment & Plan:   [name] was seen today for diabetes, hyperlipidemia, hypertension and hypothyroidism.  Diagnoses and all orders for this visit:  Snoring -     Ambulatory referral to ENT  Essential hypertension -     Cancel: POCT CBC -     CMP14+EGFR  Iron deficiency anemia -     Cancel: POCT CBC -     CBC with Differential/Platelet  DM type 2, not at goal [facility]) -     POCT glycosylated hemoglobin (Hb A1C) -     CMP14+EGFR  Hyperlipidemia with target LDL less than 100 -     CMP14+EGFR -     Lipid panel -     CBC with Differential/Platelet   I am having Mr. Benincasa maintain his Fish Oil, Multiple Vitamins-Minerals (MENS MULTI VITAMIN & MINERAL PO), calcium-vitamin D, metFORMIN, citalopram, aspirin EC, levothyroxine, cyanocobalamin, metoprolol tartrate, glipiZIDE, simvastatin, insulin detemir, lisinopril, furosemide, glucose blood, acarbose, and sulfamethoxazole-trimethoprim.  No orders of the defined types were placed in this encounter.     Follow-up: Return in about 3 months (around 02/10/2015) for diabetes, hypertension.  Claretta Fraise, M.D.

## 2014-11-11 ENCOUNTER — Ambulatory Visit (HOSPITAL_COMMUNITY)
Admission: RE | Admit: 2014-11-11 | Discharge: 2014-11-11 | Disposition: A | Discharge status: Home / Self Care | Payer: Medicare Other | Source: Ambulatory Visit | Attending: Orthopedic Surgery | Admitting: Orthopedic Surgery

## 2014-11-11 ENCOUNTER — Telehealth: Payer: Self-pay | Admitting: Family Medicine

## 2014-11-11 DIAGNOSIS — M5126 Other intervertebral disc displacement, lumbar region: Secondary | ICD-10-CM

## 2014-11-11 LAB — CMP14+EGFR
A/G RATIO: 1.7 (ref 1.1–2.5)
ALK PHOS: 53 IU/L (ref 39–117)
ALT: 14 IU/L (ref 0–44)
AST: 18 IU/L (ref 0–40)
Albumin: 4.2 g/dL (ref 3.5–4.8)
BILIRUBIN TOTAL: 0.3 mg/dL (ref 0.0–1.2)
BUN/Creatinine Ratio: 18 (ref 10–22)
BUN: 26 mg/dL (ref 8–27)
CALCIUM: 8.9 mg/dL (ref 8.6–10.2)
CHLORIDE: 98 mmol/L (ref 97–106)
CO2: 25 mmol/L (ref 18–29)
Creatinine, Ser: 1.43 mg/dL — ABNORMAL HIGH (ref 0.76–1.27)
GFR calc Af Amer: 57 mL/min/{1.73_m2} — ABNORMAL LOW (ref >59)
GFR calc non Af Amer: 49 mL/min/{1.73_m2} — ABNORMAL LOW (ref >59)
Globulin, Total: 2.5 g/dL (ref 1.5–4.5)
Glucose: 116 mg/dL — ABNORMAL HIGH (ref 65–99)
POTASSIUM: 5.1 mmol/L (ref 3.5–5.2)
Sodium: 138 mmol/L (ref 136–144)
Total Protein: 6.7 g/dL (ref 6.0–8.5)

## 2014-11-11 LAB — LIPID PANEL
CHOLESTEROL TOTAL: 127 mg/dL (ref 100–199)
Chol/HDL Ratio: 2.8 ratio units (ref 0.0–5.0)
HDL: 46 mg/dL (ref >39)
LDL Calculated: 67 mg/dL (ref 0–99)
TRIGLYCERIDES: 72 mg/dL (ref 0–149)
VLDL Cholesterol Cal: 14 mg/dL (ref 5–40)

## 2014-11-11 LAB — CBC WITH DIFFERENTIAL/PLATELET
Basophils Absolute: 0 10*3/uL (ref 0.0–0.2)
Basos: 1 %
EOS (ABSOLUTE): 0.3 10*3/uL (ref 0.0–0.4)
EOS: 3 %
Hematocrit: 38 % (ref 37.5–51.0)
Hemoglobin: 12.2 g/dL — ABNORMAL LOW (ref 12.6–17.7)
IMMATURE GRANS (ABS): 0 10*3/uL (ref 0.0–0.1)
Immature Granulocytes: 0 %
LYMPHS ABS: 1.9 10*3/uL (ref 0.7–3.1)
LYMPHS: 24 %
MCH: 31.2 pg (ref 26.6–33.0)
MCHC: 32.1 g/dL (ref 31.5–35.7)
MCV: 97 fL (ref 79–97)
MONOCYTES: 8 %
Monocytes Absolute: 0.6 10*3/uL (ref 0.1–0.9)
NEUTROS ABS: 5 10*3/uL (ref 1.4–7.0)
Neutrophils: 64 %
PLATELETS: 181 10*3/uL (ref 150–379)
RBC: 3.91 x10E6/uL — ABNORMAL LOW (ref 4.14–5.80)
RDW: 15.6 % — AB (ref 12.3–15.4)
WBC: 7.9 10*3/uL (ref 3.4–10.8)

## 2014-11-14 ENCOUNTER — Ambulatory Visit: Payer: Medicare Other

## 2014-11-18 ENCOUNTER — Ambulatory Visit (INDEPENDENT_AMBULATORY_CARE_PROVIDER_SITE_OTHER): Payer: Medicare Other

## 2014-11-18 ENCOUNTER — Ambulatory Visit (INDEPENDENT_AMBULATORY_CARE_PROVIDER_SITE_OTHER): Payer: Medicare Other | Admitting: Family Medicine

## 2014-11-18 ENCOUNTER — Encounter: Payer: Self-pay | Admitting: Family Medicine

## 2014-11-18 VITALS — BP 125/58 | HR 75 | Temp 97.2°F | Ht 73.0 in | Wt 309.0 lb

## 2014-11-18 DIAGNOSIS — I10 Essential (primary) hypertension: Secondary | ICD-10-CM | POA: Diagnosis not present

## 2014-11-18 DIAGNOSIS — R300 Dysuria: Secondary | ICD-10-CM

## 2014-11-18 DIAGNOSIS — J208 Acute bronchitis due to other specified organisms: Secondary | ICD-10-CM

## 2014-11-18 DIAGNOSIS — R441 Visual hallucinations: Secondary | ICD-10-CM

## 2014-11-18 DIAGNOSIS — F329 Major depressive disorder, single episode, unspecified: Secondary | ICD-10-CM

## 2014-11-18 DIAGNOSIS — F32A Depression, unspecified: Secondary | ICD-10-CM

## 2014-11-18 LAB — POCT UA - MICROSCOPIC ONLY
BACTERIA, U MICROSCOPIC: NEGATIVE
Casts, Ur, LPF, POC: NEGATIVE
Crystals, Ur, HPF, POC: NEGATIVE
MUCUS UA: NEGATIVE
RBC, urine, microscopic: NEGATIVE
WBC, UR, HPF, POC: NEGATIVE
YEAST UA: NEGATIVE

## 2014-11-18 LAB — POCT URINALYSIS DIPSTICK
BILIRUBIN UA: NEGATIVE
Blood, UA: NEGATIVE
GLUCOSE UA: NEGATIVE
Ketones, UA: NEGATIVE
LEUKOCYTES UA: NEGATIVE
NITRITE UA: NEGATIVE
PH UA: 6
Protein, UA: NEGATIVE
Spec Grav, UA: 1.015
Urobilinogen, UA: NEGATIVE

## 2014-11-18 MED ORDER — CYANOCOBALAMIN 1000 MCG/ML IJ SOLN
1000.0000 ug | INTRAMUSCULAR | Status: DC
  Administered 2014-11-18 – 2014-12-23 (×2): 1000 ug via INTRAMUSCULAR

## 2014-11-18 MED ORDER — AMOXICILLIN-POT CLAVULANATE 875-125 MG PO TABS
1.0000 | ORAL_TABLET | Freq: Two times a day (BID) | ORAL | Status: DC
Start: 2014-11-18 — End: 2014-11-18

## 2014-11-18 MED ORDER — AMOXICILLIN-POT CLAVULANATE 875-125 MG PO TABS: 1.0000 | ORAL_TABLET | Freq: Two times a day (BID) | ORAL | Status: DC

## 2014-11-18 MED ORDER — QUETIAPINE FUMARATE ER 200 MG PO TB24: 200.0000 mg | ORAL_TABLET | Freq: Every day | ORAL | Status: DC

## 2014-11-18 MED ORDER — BETAMETHASONE SOD PHOS & ACET 6 (3-3) MG/ML IJ SUSP
6.0000 mg | Freq: Once | INTRAMUSCULAR | Status: AC
  Administered 2014-11-18: 6 mg via INTRAMUSCULAR

## 2014-11-18 NOTE — Progress Notes (Signed)
Subjective:  Patient ID: [name], male    DOB: July 11, 1945  Age: 69 y.o. MRN: [id number]  CC: URI; Hallucinations; and Urinary Tract Infection   HPI [name] presents for cough congestion wheezing shortness of breath. Symptoms primarily with cough and productivity have been going on for several days. Of note is that he has been having hallucinations. He describes different men moving around in his bedroom in the dark. One set down in his bed. They were talking but she couldn't understand what they were saying. As soon as he turned on the light, they disappeared. This has happened on 4 or 5 occasions in the last few weeks. He is very nervous and worried and anxious as well. He is not sleeping well. Wakes several times during the night. Symptoms seem to be increasing recently. He is concerned about urinary tract infection but he just finished his antibiotics and is asymptomatic with regard to frequency urgency dysuria or flank pain nausea etc.  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   He has past surgical history that includes Appendectomy; Aortic valve replacement (July 2006); Dental surgery (05/2004); Heel spur surgery (Bilateral); Colonoscopy (04/24/2007); Tonsillectomy; bilateral cataract surg; Colonoscopy with esophagogastroduodenoscopy (egd) (N/A, 04/05/2012); Thyroidectomy (03/21/2013); Thyroidectomy (N/A, 03/21/2013); and Knee arthroscopy with lateral menisectomy (Right, 04/04/2014).   His family history includes Diabetes in an other family member.He reports that he quit smoking about 53 years ago. His smoking use included Cigarettes. He has a 6 pack-year smoking  history. He has never used smokeless tobacco. He reports that he does not drink alcohol or use illicit drugs.  Outpatient Prescriptions Prior to Visit  Medication Sig Dispense Refill  . acarbose (PRECOSE) 100 MG tablet TAKE ONE TABLET BY MOUTH TWICE DAILY 180 tablet 0  . aspirin EC 81 MG tablet Take 81 mg by mouth daily.    . calcium-vitamin D (OSCAL WITH D) 500-200 MG-UNIT per tablet Take 2 tablets by mouth 2 (two) times daily. (Patient taking differently: Take 1 tablet by mouth 2 (two) times daily. ) 60 tablet 1  . citalopram (CELEXA) 40 MG tablet Take 20 mg by mouth daily.    . furosemide (LASIX) 20 MG tablet Take 2 tablets (40 mg total) by mouth daily. 30 tablet 4  . glipiZIDE (GLUCOTROL) 5 MG tablet Take 5 mg by mouth 2 (two) times daily before a meal.     . glucose blood test strip Test blood sugar bid. DX E11.9 100 each 12  . insulin detemir (LEVEMIR) 100 UNIT/ML injection Inject 0.9 mLs (90 Units total) into the skin 2 (two) times daily. 60 mL 11  . levothyroxine (SYNTHROID, LEVOTHROID) 200 MCG tablet Take 1 tablet (200 mcg total) by mouth daily before breakfast. 90 tablet 3  . lisinopril (PRINIVIL,ZESTRIL) 10 MG tablet Take 1 tablet (10 mg total) by mouth daily. 30 tablet 2  . metFORMIN (GLUCOPHAGE) 1000 MG tablet Take 1 tablet (1,000 mg total) by mouth 2 (two) times daily with a meal. 180 tablet 3  . metoprolol tartrate (LOPRESSOR) 25 MG tablet TAKE ONE-HALF TABLET BY MOUTH TWICE DAILY 60 tablet 5  . Multiple Vitamins-Minerals (MENS MULTI VITAMIN & MINERAL PO) Take 1 tablet by mouth daily.      . Omega-3 Fatty Acids (FISH OIL) 1200 MG CAPS Take  1-2 capsules by mouth 2 (two) times daily. 1 capsule by mouth q am, 2 capsules by mouth q pm    . simvastatin (ZOCOR) 80 MG tablet Take 80 mg by mouth at bedtime.     . sulfamethoxazole-trimethoprim (BACTRIM DS) 800-160 MG tablet Take 1 tablet by mouth 2 (two) times daily. 14 tablet 0  . cyanocobalamin (,VITAMIN B-12,) 1000 MCG/ML injection  Inject 1 mL (1,000 mcg total) into the muscle every 30 (thirty) days. 1 mL 11   No facility-administered medications prior to visit.    ROS Review of Systems  Constitutional: Negative for fever, chills and diaphoresis.  HENT: Negative for congestion, rhinorrhea and sore throat.   Respiratory: Positive for cough and chest tightness. Negative for shortness of breath and wheezing.   Cardiovascular: Negative for chest pain.  Gastrointestinal: Negative for nausea, vomiting, abdominal pain, diarrhea, constipation and abdominal distention.  Genitourinary: Negative for dysuria and frequency.  Musculoskeletal: Negative for joint swelling and arthralgias.  Skin: Negative for rash.  Neurological: Negative for headaches.  Psychiatric/Behavioral: Positive for hallucinations, confusion, sleep disturbance, dysphoric mood and agitation. The patient is nervous/anxious.     Objective:  BP 125/58 mmHg  Pulse 75  Temp(Src) 97.2 F (36.2 C) (Oral)  Ht '6\' 1"'  (1.854 m)  Wt 309 lb (140.161 kg)  BMI 40.78 kg/m2  BP Readings from Last 3 Encounters:  11/18/14 125/58  11/10/14 106/62  11/08/14 130/65    Wt Readings from Last 3 Encounters:  11/18/14 309 lb (140.161 kg)  11/10/14 308 lb 3.2 oz (139.799 kg)  11/08/14 310 lb 6.4 oz (140.797 kg)     Physical Exam  Constitutional: He is oriented to person, place, and time. He appears well-developed and well-nourished. He appears distressed.  HENT:  Head: Normocephalic and atraumatic.  Right Ear: External ear normal.  Left Ear: External ear normal.  Nose: Nose normal.  Mouth/Throat: Oropharynx is clear and moist.  Eyes: Conjunctivae and EOM are normal. Pupils are equal, round, and reactive to light.  Neck: Normal range of motion. Neck supple. No thyromegaly present.  Cardiovascular: Normal rate, regular rhythm and normal heart sounds.   No murmur heard. Pulmonary/Chest: Effort normal. No respiratory distress. He has wheezes. He has no rales.    Abdominal: Soft. Bowel sounds are normal. He exhibits no distension. There is no tenderness.  Musculoskeletal: Normal range of motion.  Lymphadenopathy:    He has no cervical adenopathy.  Neurological: He is alert and oriented to person, place, and time. He has normal reflexes.  Skin: Skin is warm and dry.  Psychiatric: His speech is normal. His mood appears anxious. He is agitated and slowed. Thought content is delusional. Cognition and memory are impaired. He expresses inappropriate judgment.    Lab Results  Component Value Date   HGBA1C 6.4 11/10/2014   HGBA1C 7.4 08/06/2014   HGBA1C 7.5* 04/06/2014    Lab Results  Component Value Date   WBC 7.9 11/10/2014   HGB 12.9* 08/06/2014   HCT 38.0 11/10/2014   PLT 157 04/07/2014   GLUCOSE 116* 11/10/2014   CHOL 127 11/10/2014   TRIG 72 11/10/2014   HDL 46 11/10/2014   LDLCALC 67 11/10/2014   ALT 14 11/10/2014   AST 18 11/10/2014   NA 138 11/10/2014   K 5.1 11/10/2014   CL 98 11/10/2014   CREATININE 1.43* 11/10/2014   BUN 26 11/10/2014   CO2 25 11/10/2014   TSH 0.902 04/28/2014   PSA 0.2 08/06/2014   HGBA1C 6.4 11/10/2014  MICROALBUR 1.50 04/16/2012    No results found.  Assessment & Plan:   Gautam was seen today for uri, hallucinations and urinary tract infection.  Diagnoses and all orders for this visit:  Acute bronchitis due to other specified organisms -     DG Chest 2 View; Future -     CBC with Differential/Platelet -     CMP14+EGFR -     betamethasone acetate-betamethasone sodium phosphate (CELESTONE) injection 6 mg; Inject 1 mL (6 mg total) into the muscle once. -     cyanocobalamin ((VITAMIN B-12)) injection 1,000 mcg; Inject 1 mL (1,000 mcg total) into the muscle every 30 (thirty) days. -     Discontinue: amoxicillin-clavulanate (AUGMENTIN) 875-125 MG per tablet 1 tablet; Take 1 tablet by mouth every 12 (twelve) hours.  Dysuria -     POCT UA - Microscopic Only -     POCT urinalysis dipstick -     DG  Chest 2 View; Future -     CBC with Differential/Platelet -     CMP14+EGFR -     betamethasone acetate-betamethasone sodium phosphate (CELESTONE) injection 6 mg; Inject 1 mL (6 mg total) into the muscle once. -     cyanocobalamin ((VITAMIN B-12)) injection 1,000 mcg; Inject 1 mL (1,000 mcg total) into the muscle every 30 (thirty) days. -     Discontinue: amoxicillin-clavulanate (AUGMENTIN) 875-125 MG per tablet 1 tablet; Take 1 tablet by mouth every 12 (twelve) hours.  Hallucination, visual -     DG Chest 2 View; Future -     CBC with Differential/Platelet -     CMP14+EGFR -     betamethasone acetate-betamethasone sodium phosphate (CELESTONE) injection 6 mg; Inject 1 mL (6 mg total) into the muscle once. -     cyanocobalamin ((VITAMIN B-12)) injection 1,000 mcg; Inject 1 mL (1,000 mcg total) into the muscle every 30 (thirty) days. -     Discontinue: amoxicillin-clavulanate (AUGMENTIN) 875-125 MG per tablet 1 tablet; Take 1 tablet by mouth every 12 (twelve) hours.  Essential hypertension  Depression  Other orders -     QUEtiapine (SEROQUEL XR) 200 MG 24 hr tablet; Take 1 tablet (200 mg total) by mouth at bedtime. -     amoxicillin-clavulanate (AUGMENTIN) 875-125 MG tablet; Take 1 tablet by mouth 2 (two) times daily.   I have discontinued Mr. Heyer cyanocobalamin. I am also having him start on QUEtiapine and amoxicillin-clavulanate. Additionally, I am having him maintain his Fish Oil, Multiple Vitamins-Minerals (MENS MULTI VITAMIN & MINERAL PO), calcium-vitamin D, metFORMIN, citalopram, aspirin EC, levothyroxine, metoprolol tartrate, glipiZIDE, simvastatin, insulin detemir, lisinopril, furosemide, glucose blood, acarbose, and sulfamethoxazole-trimethoprim. We administered betamethasone acetate-betamethasone sodium phosphate and cyanocobalamin. We will continue to administer cyanocobalamin.  Meds ordered this encounter  Medications  . betamethasone acetate-betamethasone sodium phosphate  (CELESTONE) injection 6 mg    Sig:   . cyanocobalamin ((VITAMIN B-12)) injection 1,000 mcg    Sig:   . DISCONTD: amoxicillin-clavulanate (AUGMENTIN) 875-125 MG per tablet 1 tablet    Sig:   . QUEtiapine (SEROQUEL XR) 200 MG 24 hr tablet    Sig: Take 1 tablet (200 mg total) by mouth at bedtime.    Dispense:  30 tablet    Refill:  2  . amoxicillin-clavulanate (AUGMENTIN) 875-125 MG tablet    Sig: Take 1 tablet by mouth 2 (two) times daily.    Dispense:  20 tablet    Refill:  0     Follow-up: Return in about 2 weeks (around  12/02/2014) for diabetes, Depression.  Claretta Fraise, M.D.

## 2014-11-19 ENCOUNTER — Other Ambulatory Visit (INDEPENDENT_AMBULATORY_CARE_PROVIDER_SITE_OTHER): Payer: Medicare Other

## 2014-11-19 ENCOUNTER — Telehealth: Payer: Self-pay | Admitting: Family Medicine

## 2014-11-19 ENCOUNTER — Telehealth: Payer: Self-pay | Admitting: *Deleted

## 2014-11-19 DIAGNOSIS — E875 Hyperkalemia: Secondary | ICD-10-CM

## 2014-11-19 LAB — CBC WITH DIFFERENTIAL/PLATELET
BASOS ABS: 0 10*3/uL (ref 0.0–0.2)
Basos: 0 %
EOS (ABSOLUTE): 0.4 10*3/uL (ref 0.0–0.4)
EOS: 4 %
Hematocrit: 38.7 % (ref 37.5–51.0)
Hemoglobin: 12.5 g/dL — ABNORMAL LOW (ref 12.6–17.7)
IMMATURE GRANULOCYTES: 0 %
Immature Grans (Abs): 0 10*3/uL (ref 0.0–0.1)
Lymphocytes Absolute: 2.3 10*3/uL (ref 0.7–3.1)
Lymphs: 25 %
MCH: 31.1 pg (ref 26.6–33.0)
MCHC: 32.3 g/dL (ref 31.5–35.7)
MCV: 96 fL (ref 79–97)
MONOS ABS: 0.8 10*3/uL (ref 0.1–0.9)
Monocytes: 8 %
NEUTROS PCT: 63 %
Neutrophils Absolute: 5.8 10*3/uL (ref 1.4–7.0)
PLATELETS: 190 10*3/uL (ref 150–379)
RBC: 4.02 x10E6/uL — AB (ref 4.14–5.80)
RDW: 15.6 % — AB (ref 12.3–15.4)
WBC: 9.3 10*3/uL (ref 3.4–10.8)

## 2014-11-19 LAB — CMP14+EGFR
A/G RATIO: 1.8 (ref 1.1–2.5)
ALT: 16 IU/L (ref 0–44)
AST: 17 IU/L (ref 0–40)
Albumin: 4.3 g/dL (ref 3.5–4.8)
Alkaline Phosphatase: 58 IU/L (ref 39–117)
BUN / CREAT RATIO: 18 (ref 10–22)
BUN: 25 mg/dL (ref 8–27)
Bilirubin Total: <0.2 mg/dL (ref 0.0–1.2)
CALCIUM: 9.3 mg/dL (ref 8.6–10.2)
CO2: 23 mmol/L (ref 18–29)
Chloride: 100 mmol/L (ref 97–106)
Creatinine, Ser: 1.42 mg/dL — ABNORMAL HIGH (ref 0.76–1.27)
GFR, EST AFRICAN AMERICAN: 57 mL/min/{1.73_m2} — AB (ref >59)
GFR, EST NON AFRICAN AMERICAN: 49 mL/min/{1.73_m2} — AB (ref >59)
GLOBULIN, TOTAL: 2.4 g/dL (ref 1.5–4.5)
Glucose: 134 mg/dL — ABNORMAL HIGH (ref 65–99)
POTASSIUM: 6.2 mmol/L — AB (ref 3.5–5.2)
SODIUM: 139 mmol/L (ref 136–144)
TOTAL PROTEIN: 6.7 g/dL (ref 6.0–8.5)

## 2014-11-19 NOTE — Progress Notes (Signed)
LAB ONLY 

## 2014-11-19 NOTE — Telephone Encounter (Signed)
Returned call, wants a letter sent to the [place] on why we changed his meds.

## 2014-11-19 NOTE — Telephone Encounter (Signed)
Needs something typed up as to why his meds were changed. (citalopram). Needs this faxed to the [place] at [phone]

## 2014-11-20 ENCOUNTER — Telehealth: Payer: Self-pay | Admitting: *Deleted

## 2014-11-20 ENCOUNTER — Other Ambulatory Visit: Payer: Self-pay | Admitting: *Deleted

## 2014-11-20 ENCOUNTER — Encounter: Payer: Self-pay | Admitting: *Deleted

## 2014-11-20 ENCOUNTER — Other Ambulatory Visit (INDEPENDENT_AMBULATORY_CARE_PROVIDER_SITE_OTHER): Payer: Medicare Other

## 2014-11-20 DIAGNOSIS — E875 Hyperkalemia: Secondary | ICD-10-CM | POA: Diagnosis not present

## 2014-11-20 LAB — CMP14+EGFR
ALBUMIN: 4.2 g/dL (ref 3.5–4.8)
ALBUMIN: 4.3 g/dL (ref 3.5–4.8)
ALK PHOS: 56 IU/L (ref 39–117)
ALT: 16 IU/L (ref 0–44)
ALT: 18 IU/L (ref 0–44)
AST: 14 IU/L (ref 0–40)
AST: 17 IU/L (ref 0–40)
Albumin/Globulin Ratio: 1.5 (ref 1.1–2.5)
Albumin/Globulin Ratio: 1.7 (ref 1.1–2.5)
Alkaline Phosphatase: 59 IU/L (ref 39–117)
BUN / CREAT RATIO: 16 (ref 10–22)
BUN / CREAT RATIO: 22 (ref 10–22)
BUN: 24 mg/dL (ref 8–27)
BUN: 24 mg/dL (ref 8–27)
Bilirubin Total: 0.3 mg/dL (ref 0.0–1.2)
Bilirubin Total: 0.3 mg/dL (ref 0.0–1.2)
CO2: 21 mmol/L (ref 18–29)
CO2: 24 mmol/L (ref 18–29)
CREATININE: 1.47 mg/dL — AB (ref 0.76–1.27)
CREATININE: 1.07 mg/dL (ref 0.76–1.27)
Calcium: 9.3 mg/dL (ref 8.6–10.2)
Calcium: 9.4 mg/dL (ref 8.6–10.2)
Chloride: 99 mmol/L (ref 97–106)
Chloride: 98 mmol/L (ref 97–106)
GFR calc Af Amer: 55 mL/min/{1.73_m2} — ABNORMAL LOW (ref >59)
GFR calc non Af Amer: 69 mL/min/{1.73_m2} (ref >59)
GFR, EST AFRICAN AMERICAN: 80 mL/min/{1.73_m2} (ref >59)
GFR, EST NON AFRICAN AMERICAN: 47 mL/min/{1.73_m2} — AB (ref >59)
GLOBULIN, TOTAL: 2.8 g/dL (ref 1.5–4.5)
GLOBULIN, TOTAL: 2.5 g/dL (ref 1.5–4.5)
Glucose: 183 mg/dL — ABNORMAL HIGH (ref 65–99)
Glucose: 128 mg/dL — ABNORMAL HIGH (ref 65–99)
Potassium: 6.1 mmol/L (ref 3.5–5.2)
Potassium: 5.6 mmol/L — ABNORMAL HIGH (ref 3.5–5.2)
SODIUM: 136 mmol/L (ref 136–144)
SODIUM: 138 mmol/L (ref 136–144)
TOTAL PROTEIN: 6.8 g/dL (ref 6.0–8.5)
Total Protein: 7 g/dL (ref 6.0–8.5)

## 2014-11-20 NOTE — Telephone Encounter (Signed)
Pt notified letter has been written but will not go thru on fax number given Copy of letter to front for pt pick up

## 2014-11-20 NOTE — Telephone Encounter (Signed)
Pt notified of results and Dr Livia Snellen recommendation Pt verbalizes understanding

## 2014-11-20 NOTE — Progress Notes (Signed)
Lab only 

## 2014-11-20 NOTE — Telephone Encounter (Signed)
Letter was faxed to [facility] as requested by pt Confirmation recieved

## 2014-11-20 NOTE — Telephone Encounter (Signed)
-----   Message from Claretta Fraise, MD sent at 11/20/2014  2:20 PM EDT ----- Mr. lonzy labs look stable with the exception of an incredibly high potassium that was confirmed. He needs to take 2 extra Lasix tablets today. He needs to discontinue his lisinopril. And his potassium needs to be repeated tomorrow.

## 2014-11-21 ENCOUNTER — Telehealth: Payer: Self-pay | Admitting: Family Medicine

## 2014-11-21 NOTE — Telephone Encounter (Signed)
Pt reminded to stay off Lisinopril and take extra Furosemide Recheck K+ on Monday Verbalizes understanding

## 2014-11-23 DIAGNOSIS — R69 Illness, unspecified: Secondary | ICD-10-CM | POA: Diagnosis not present

## 2014-11-24 ENCOUNTER — Other Ambulatory Visit (INDEPENDENT_AMBULATORY_CARE_PROVIDER_SITE_OTHER): Payer: Medicare Other

## 2014-11-24 ENCOUNTER — Other Ambulatory Visit: Payer: Self-pay | Admitting: *Deleted

## 2014-11-24 ENCOUNTER — Telehealth: Payer: Self-pay | Admitting: Family Medicine

## 2014-11-24 DIAGNOSIS — E875 Hyperkalemia: Secondary | ICD-10-CM

## 2014-11-24 LAB — CMP14+EGFR
A/G RATIO: 1.7 (ref 1.1–2.5)
ALBUMIN: 4.3 g/dL (ref 3.5–4.8)
ALT: 17 IU/L (ref 0–44)
AST: 17 IU/L (ref 0–40)
Alkaline Phosphatase: 64 IU/L (ref 39–117)
BUN / CREAT RATIO: 26 — AB (ref 10–22)
BUN: 33 mg/dL — ABNORMAL HIGH (ref 8–27)
Bilirubin Total: 0.3 mg/dL (ref 0.0–1.2)
CALCIUM: 9.4 mg/dL (ref 8.6–10.2)
CO2: 30 mmol/L — ABNORMAL HIGH (ref 18–29)
Chloride: 99 mmol/L (ref 97–106)
Creatinine, Ser: 1.27 mg/dL (ref 0.76–1.27)
GFR, EST AFRICAN AMERICAN: 65 mL/min/{1.73_m2} (ref >59)
GFR, EST NON AFRICAN AMERICAN: 56 mL/min/{1.73_m2} — AB (ref >59)
GLOBULIN, TOTAL: 2.5 g/dL (ref 1.5–4.5)
Glucose: 184 mg/dL — ABNORMAL HIGH (ref 65–99)
POTASSIUM: 5.1 mmol/L (ref 3.5–5.2)
SODIUM: 136 mmol/L (ref 136–144)
TOTAL PROTEIN: 6.8 g/dL (ref 6.0–8.5)

## 2014-11-24 MED ORDER — GLIPIZIDE 5 MG PO TABS: 5.0000 mg | ORAL_TABLET | Freq: Two times a day (BID) | ORAL | Status: DC

## 2014-11-24 MED ORDER — LISINOPRIL 10 MG PO TABS: 10.0000 mg | ORAL_TABLET | Freq: Every day | ORAL | Status: DC

## 2014-11-24 MED ORDER — ARIPIPRAZOLE 5 MG PO TABS: 5.0000 mg | ORAL_TABLET | Freq: Every day | ORAL | Status: DC

## 2014-11-24 NOTE — Telephone Encounter (Signed)
Spoke with pt regarding change in medication Pt also notified of nml K+ results

## 2014-11-24 NOTE — Progress Notes (Signed)
Lab only 

## 2014-11-24 NOTE — Telephone Encounter (Signed)
Pt took Seroquel Friday night On Saturday AM was still very lethargic and disoriented Does not want to take this med Please change to something else

## 2014-11-24 NOTE — Telephone Encounter (Signed)
Switch to Abilify. 5 mg once a day sent to pharmacy. DC Seroquel.

## 2014-11-25 ENCOUNTER — Telehealth: Payer: Self-pay | Admitting: Pharmacist

## 2014-11-27 ENCOUNTER — Telehealth: Payer: Self-pay | Admitting: Family Medicine

## 2014-11-27 NOTE — Telephone Encounter (Signed)
Pt wanted to change pharmacy to [facility] in [place]

## 2014-11-28 ENCOUNTER — Telehealth: Payer: Self-pay | Admitting: Family Medicine

## 2014-11-28 DIAGNOSIS — E113493 Type 2 diabetes mellitus with severe nonproliferative diabetic retinopathy without macular edema, bilateral: Secondary | ICD-10-CM | POA: Diagnosis not present

## 2014-11-28 DIAGNOSIS — Z961 Presence of intraocular lens: Secondary | ICD-10-CM | POA: Diagnosis not present

## 2014-11-28 DIAGNOSIS — H43813 Vitreous degeneration, bilateral: Secondary | ICD-10-CM | POA: Diagnosis not present

## 2014-11-28 DIAGNOSIS — H35033 Hypertensive retinopathy, bilateral: Secondary | ICD-10-CM | POA: Diagnosis not present

## 2014-11-28 LAB — HM DIABETES EYE EXAM

## 2014-12-01 ENCOUNTER — Encounter: Payer: Self-pay | Admitting: Family Medicine

## 2014-12-01 ENCOUNTER — Ambulatory Visit (INDEPENDENT_AMBULATORY_CARE_PROVIDER_SITE_OTHER): Payer: Medicare Other | Admitting: Family Medicine

## 2014-12-01 ENCOUNTER — Encounter: Payer: Self-pay | Admitting: *Deleted

## 2014-12-01 ENCOUNTER — Telehealth: Payer: Self-pay | Admitting: Family Medicine

## 2014-12-01 VITALS — BP 123/61 | HR 80 | Temp 97.6°F | Ht 73.0 in | Wt 303.2 lb

## 2014-12-01 DIAGNOSIS — E875 Hyperkalemia: Secondary | ICD-10-CM

## 2014-12-01 DIAGNOSIS — F329 Major depressive disorder, single episode, unspecified: Secondary | ICD-10-CM | POA: Diagnosis not present

## 2014-12-01 DIAGNOSIS — I10 Essential (primary) hypertension: Secondary | ICD-10-CM

## 2014-12-01 DIAGNOSIS — F32A Depression, unspecified: Secondary | ICD-10-CM

## 2014-12-01 DIAGNOSIS — D509 Iron deficiency anemia, unspecified: Secondary | ICD-10-CM | POA: Diagnosis not present

## 2014-12-01 DIAGNOSIS — E119 Type 2 diabetes mellitus without complications: Secondary | ICD-10-CM

## 2014-12-01 DIAGNOSIS — E785 Hyperlipidemia, unspecified: Secondary | ICD-10-CM | POA: Diagnosis not present

## 2014-12-01 DIAGNOSIS — R443 Hallucinations, unspecified: Secondary | ICD-10-CM | POA: Diagnosis not present

## 2014-12-01 LAB — POCT GLYCOSYLATED HEMOGLOBIN (HGB A1C): HEMOGLOBIN A1C: 6.5

## 2014-12-01 NOTE — Telephone Encounter (Signed)
I have tried to call patient 3 times with no answer and not able to LM.

## 2014-12-01 NOTE — Progress Notes (Signed)
Subjective:  Patient ID: [name], male    DOB: 10-Sep-1945  Age: 69 y.o. MRN: [id number]  CC: discuss medications   HPI LELAN CUSH presents for recheck of potassium level as well as to report that the hallucinations have resolved. He Did not try abilify after DC of seroquel. He did DC the citalopram. He also DCed the lisinopril. He continues lasix 20 mg BID.  Hypertension stable so far without the ACE. Also in for DM check. States glucose has been 100-150 usually. Got off track checking due to recent concerns for potassium and hallucinations. Follows a diabetic diet generally. Walking for exercise. Denies hypoglycemic spells.   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   He has past surgical history that includes Appendectomy; Aortic valve replacement (July 2006); Dental surgery (05/2004); Heel spur surgery (Bilateral); Colonoscopy (04/24/2007); Tonsillectomy; bilateral cataract surg; Colonoscopy with esophagogastroduodenoscopy (egd) (N/A, 04/05/2012); Thyroidectomy (03/21/2013); Thyroidectomy (N/A, 03/21/2013); and Knee arthroscopy with lateral menisectomy (Right, 04/04/2014).   His family history includes Diabetes in an other family member.He reports that he quit smoking about 53 years ago. His smoking use included Cigarettes. He has a 6 pack-year smoking history. He has never used smokeless tobacco. He reports that he does not drink alcohol or use illicit drugs.  Outpatient Prescriptions Prior to Visit  Medication Sig Dispense Refill  . acarbose (PRECOSE) 100 MG tablet TAKE ONE TABLET BY MOUTH TWICE DAILY 180 tablet 0  . aspirin EC 81 MG tablet Take 81 mg by mouth  daily.    . calcium-vitamin D (OSCAL WITH D) 500-200 MG-UNIT per tablet Take 2 tablets by mouth 2 (two) times daily. (Patient taking differently: Take 1 tablet by mouth 2 (two) times daily. ) 60 tablet 1  . furosemide (LASIX) 20 MG tablet Take 2 tablets (40 mg total) by mouth daily. 30 tablet 4  . glucose blood test strip Test blood sugar bid. DX E11.9 100 each 12  . insulin detemir (LEVEMIR) 100 UNIT/ML injection Inject 0.9 mLs (90 Units total) into the skin 2 (two) times daily. 60 mL 11  . levothyroxine (SYNTHROID, LEVOTHROID) 200 MCG tablet Take 1 tablet (200 mcg total) by mouth daily before breakfast. 90 tablet 3  . metFORMIN (GLUCOPHAGE) 1000 MG tablet Take 1 tablet (1,000 mg total) by mouth 2 (two) times daily with a meal. 180 tablet 3  . metoprolol tartrate (LOPRESSOR) 25 MG tablet TAKE ONE-HALF TABLET BY MOUTH TWICE DAILY 60 tablet 5  . Multiple Vitamins-Minerals (MENS MULTI VITAMIN & MINERAL PO) Take 1 tablet by mouth daily.      . Omega-3 Fatty Acids (FISH OIL) 1200 MG CAPS Take 1-2 capsules by mouth 2 (two) times daily. 1 capsule by mouth q am, 2 capsules by mouth q pm    . simvastatin (ZOCOR) 80 MG tablet Take 80 mg by mouth at bedtime.     Marland Kitchen lisinopril (PRINIVIL,ZESTRIL) 10 MG tablet Take 1 tablet (10 mg total) by mouth daily. 30 tablet 5  . citalopram (CELEXA) 40 MG tablet Take 20 mg by mouth daily.    Marland Kitchen glipiZIDE (GLUCOTROL) 5 MG tablet Take 1 tablet (5 mg total) by mouth 2 (two) times daily before a meal. 180 tablet 1  . amoxicillin-clavulanate (AUGMENTIN) 875-125 MG tablet Take 1 tablet by mouth 2 (two)  times daily. (Patient not taking: Reported on 12/01/2014) 20 tablet 0  . ARIPiprazole (ABILIFY) 5 MG tablet Take 1 tablet (5 mg total) by mouth daily. (Patient not taking: Reported on 12/01/2014) 30 tablet 2  . sulfamethoxazole-trimethoprim (BACTRIM DS) 800-160 MG tablet Take 1 tablet by mouth 2 (two) times daily. (Patient not taking: Reported on 12/01/2014) 14 tablet 0    Facility-Administered Medications Prior to Visit  Medication Dose Route Frequency Provider Last Rate Last Dose  . cyanocobalamin ((VITAMIN B-12)) injection 1,000 mcg  1,000 mcg Intramuscular Q30 days Claretta Fraise, MD   1,000 mcg at 11/18/14 1654    ROS Review of Systems  Constitutional: Negative for fever, chills and diaphoresis.  HENT: Negative for congestion, rhinorrhea and sore throat.   Respiratory: Negative for cough and shortness of breath.   Cardiovascular: Negative for chest pain.  Gastrointestinal: Negative for nausea, vomiting and abdominal pain.  Musculoskeletal: Negative for arthralgias.  Skin: Negative for rash.  Neurological: Negative for headaches.    Objective:  BP 123/61 mmHg  Pulse 80  Temp(Src) 97.6 F (36.4 C) (Oral)  Ht '6\' 1"'  (1.854 m)  Wt 303 lb 3.2 oz (137.531 kg)  BMI 40.01 kg/m2  SpO2 97%  BP Readings from Last 3 Encounters:  12/01/14 123/61  11/18/14 125/58  11/10/14 106/62    Wt Readings from Last 3 Encounters:  12/01/14 303 lb 3.2 oz (137.531 kg)  11/18/14 309 lb (140.161 kg)  11/10/14 308 lb 3.2 oz (139.799 kg)     Physical Exam  Constitutional: He is oriented to person, place, and time. He appears well-developed and well-nourished. No distress.  HENT:  Head: Normocephalic and atraumatic.  Right Ear: External ear normal.  Left Ear: External ear normal.  Nose: Nose normal.  Mouth/Throat: Oropharynx is clear and moist.  Eyes: Conjunctivae and EOM are normal. Pupils are equal, round, and reactive to light.  Neck: Normal range of motion. Neck supple. No thyromegaly present.  Cardiovascular: Normal rate, regular rhythm and normal heart sounds.   No murmur heard. Pulmonary/Chest: Effort normal and breath sounds normal. No respiratory distress. He has no wheezes. He has no rales.  Abdominal: Soft. Bowel sounds are normal. He exhibits no distension. There is no tenderness.  Lymphadenopathy:    He has no cervical adenopathy.   Neurological: He is alert and oriented to person, place, and time. He has normal reflexes.  Skin: Skin is warm and dry.  Psychiatric: He has a normal mood and affect. His behavior is normal. Judgment and thought content normal.    Lab Results  Component Value Date   HGBA1C 6.5 12/01/2014   HGBA1C 6.4 11/10/2014   HGBA1C 7.4 08/06/2014    Lab Results  Component Value Date   WBC 9.3 11/18/2014   HGB 12.9* 08/06/2014   HCT 38.7 11/18/2014   PLT 157 04/07/2014   GLUCOSE 184* 11/24/2014   CHOL 127 11/10/2014   TRIG 72 11/10/2014   HDL 46 11/10/2014   LDLCALC 67 11/10/2014   ALT 17 11/24/2014   AST 17 11/24/2014   NA 136 11/24/2014   K 5.1 11/24/2014   CL 99 11/24/2014   CREATININE 1.27 11/24/2014   BUN 33* 11/24/2014   CO2 30* 11/24/2014   TSH 0.902 04/28/2014   PSA 0.2 08/06/2014   HGBA1C 6.5 12/01/2014   MICROALBUR 1.50 04/16/2012    No results found.  Assessment & Plan:   Quillan was seen today for discuss medications.  Diagnoses and all orders for this visit:  DM type 2, not at goal [facility]) -     POCT glycosylated hemoglobin (Hb A1C) -     CMP14+EGFR  Essential hypertension -     Cancel: POCT CBC -     CMP14+EGFR  Hallucination  Depression  Hyperkalemia  Iron deficiency anemia -     Cancel: POCT CBC  Hyperlipidemia with target LDL less than 100 -     CMP14+EGFR -     Lipid panel   I have discontinued Mr. Racca's sulfamethoxazole-trimethoprim, amoxicillin-clavulanate, ARIPiprazole, and lisinopril. I am also having him maintain his Fish Oil, Multiple Vitamins-Minerals (MENS MULTI VITAMIN & MINERAL PO), calcium-vitamin D, metFORMIN, citalopram, aspirin EC, levothyroxine, metoprolol tartrate, simvastatin, insulin detemir, furosemide, glucose blood, acarbose, and glipiZIDE. We will continue to administer cyanocobalamin.  No orders of the defined types were placed in this encounter.     Follow-up: Return in about 3 months (around 03/03/2015), or if  symptoms worsen or fail to improve, for diabetes, hypertension, Depression.  Claretta Fraise, M.D.

## 2014-12-01 NOTE — Telephone Encounter (Signed)
Stp and he already has an appt scheduled this afternoon.

## 2014-12-01 NOTE — Telephone Encounter (Signed)
Stp and he has an appt scheduled for this afternoon.

## 2014-12-02 ENCOUNTER — Telehealth: Payer: Self-pay | Admitting: Family Medicine

## 2014-12-02 ENCOUNTER — Other Ambulatory Visit: Payer: Self-pay | Admitting: *Deleted

## 2014-12-02 LAB — CBC WITH DIFFERENTIAL/PLATELET
BASOS ABS: 0 10*3/uL (ref 0.0–0.2)
BASOS: 0 %
EOS (ABSOLUTE): 0.2 10*3/uL (ref 0.0–0.4)
EOS: 2 %
HEMATOCRIT: 36.6 % — AB (ref 37.5–51.0)
HEMOGLOBIN: 12 g/dL — AB (ref 12.6–17.7)
IMMATURE GRANS (ABS): 0 10*3/uL (ref 0.0–0.1)
Immature Granulocytes: 0 %
Lymphocytes Absolute: 2.1 10*3/uL (ref 0.7–3.1)
Lymphs: 21 %
MCH: 31.3 pg (ref 26.6–33.0)
MCHC: 32.8 g/dL (ref 31.5–35.7)
MCV: 95 fL (ref 79–97)
MONOCYTES: 8 %
Monocytes Absolute: 0.8 10*3/uL (ref 0.1–0.9)
NEUTROS ABS: 7.1 10*3/uL — AB (ref 1.4–7.0)
Neutrophils: 69 %
Platelets: 163 10*3/uL (ref 150–379)
RBC: 3.84 x10E6/uL — AB (ref 4.14–5.80)
RDW: 15.8 % — ABNORMAL HIGH (ref 12.3–15.4)
WBC: 10.2 10*3/uL (ref 3.4–10.8)

## 2014-12-02 LAB — LIPID PANEL
CHOLESTEROL TOTAL: 129 mg/dL (ref 100–199)
Chol/HDL Ratio: 3.4 ratio units (ref 0.0–5.0)
HDL: 38 mg/dL — AB (ref >39)
LDL Calculated: 61 mg/dL (ref 0–99)
TRIGLYCERIDES: 152 mg/dL — AB (ref 0–149)
VLDL CHOLESTEROL CAL: 30 mg/dL (ref 5–40)

## 2014-12-02 LAB — CMP14+EGFR
A/G RATIO: 1.7 (ref 1.1–2.5)
ALT: 15 IU/L (ref 0–44)
AST: 21 IU/L (ref 0–40)
Albumin: 4 g/dL (ref 3.5–4.8)
Alkaline Phosphatase: 59 IU/L (ref 39–117)
BILIRUBIN TOTAL: 0.2 mg/dL (ref 0.0–1.2)
BUN/Creatinine Ratio: 22 (ref 10–22)
BUN: 26 mg/dL (ref 8–27)
CHLORIDE: 98 mmol/L (ref 97–106)
CO2: 23 mmol/L (ref 18–29)
Calcium: 8.8 mg/dL (ref 8.6–10.2)
Creatinine, Ser: 1.17 mg/dL (ref 0.76–1.27)
GFR calc Af Amer: 72 mL/min/{1.73_m2} (ref >59)
GFR calc non Af Amer: 62 mL/min/{1.73_m2} (ref >59)
GLUCOSE: 87 mg/dL (ref 65–99)
Globulin, Total: 2.4 g/dL (ref 1.5–4.5)
POTASSIUM: 4.9 mmol/L (ref 3.5–5.2)
Sodium: 142 mmol/L (ref 136–144)
TOTAL PROTEIN: 6.4 g/dL (ref 6.0–8.5)

## 2014-12-02 MED ORDER — GLUCOSE BLOOD VI STRP: ORAL_STRIP | Status: DC

## 2014-12-02 NOTE — Telephone Encounter (Signed)
done

## 2014-12-03 ENCOUNTER — Ambulatory Visit: Payer: Medicare Other | Admitting: Family Medicine

## 2014-12-03 DIAGNOSIS — L110 Acquired keratosis follicularis: Secondary | ICD-10-CM | POA: Diagnosis not present

## 2014-12-03 DIAGNOSIS — E1151 Type 2 diabetes mellitus with diabetic peripheral angiopathy without gangrene: Secondary | ICD-10-CM | POA: Diagnosis not present

## 2014-12-03 DIAGNOSIS — L609 Nail disorder, unspecified: Secondary | ICD-10-CM | POA: Diagnosis not present

## 2014-12-03 DIAGNOSIS — E1140 Type 2 diabetes mellitus with diabetic neuropathy, unspecified: Secondary | ICD-10-CM | POA: Diagnosis not present

## 2014-12-09 ENCOUNTER — Telehealth: Payer: Self-pay | Admitting: *Deleted

## 2014-12-09 NOTE — Telephone Encounter (Signed)
Patient called stating [name] wanted him to go to [facility] for his MRI and he did not go, Patient stated he has told Dr. Aline Brochure 1000 times that he does not need a MRI on his back it is his Right Knee, I made him aware that Dr. Aline Brochure ordered the MRI for his back at [facility], Patient stated he does not want the MRI on his back, he wants a MRI on his right knee. I made patient aware that I would send a message to Dr. Ruthe Mannan nurse. Please advise

## 2014-12-10 ENCOUNTER — Other Ambulatory Visit: Payer: Self-pay | Admitting: *Deleted

## 2014-12-10 DIAGNOSIS — M48061 Spinal stenosis, lumbar region without neurogenic claudication: Secondary | ICD-10-CM

## 2014-12-10 NOTE — Telephone Encounter (Signed)
Explained to patient that doctor ordered MRI of back and gave reasoning, patient agrees but states he cannot fit in machine at [facility], will need order for open MRI, new order entered

## 2014-12-10 NOTE — Telephone Encounter (Signed)
Called patient, no answer 

## 2014-12-11 NOTE — Telephone Encounter (Signed)
Noted  

## 2014-12-13 ENCOUNTER — Telehealth: Payer: Self-pay | Admitting: Family Medicine

## 2014-12-13 NOTE — Telephone Encounter (Signed)
TC back to pt, who states he is still having hallucinations on the Citalopram, he is going to go pick up the Abilify that Dr. Livia Snellen prescribed but he had not started yet. He took his last dose of Citalopram yesterday, he will start the Abilify today.

## 2014-12-15 MED ORDER — ARIPIPRAZOLE 5 MG PO TABS: 5.0000 mg | ORAL_TABLET | Freq: Every day | ORAL | Status: DC

## 2014-12-15 NOTE — Telephone Encounter (Signed)
Pt is calling back this afternoon to let us know the [place] info, to send abilify

## 2014-12-15 NOTE — Telephone Encounter (Signed)
Printed and faxed to [place] in [place]

## 2014-12-15 NOTE — Telephone Encounter (Signed)
Stp and he voiced understanding but asked for [name] to send the rx to the [place] and that Barnett Applebaum knew what to do.

## 2014-12-15 NOTE — Addendum Note (Signed)
Addended byCarrolyn Leigh on: 12/15/2014 03:47 PM   Modules accepted: Orders

## 2014-12-15 NOTE — Telephone Encounter (Signed)
Please contact the patient to let him know that I think the change is a great idea. Have him follow up with me after he has been on the abilify for 2 weeks. Meanwhile he should reduce the citalopram to 1/2 dose daily for 1 week. Then 1/2 dose qod X 1 week, then DC.

## 2014-12-19 ENCOUNTER — Telehealth: Payer: Self-pay | Admitting: Family Medicine

## 2014-12-19 NOTE — Telephone Encounter (Signed)
Stp and he had questions about how to take his citalopram to get off of it and switch to the Abilify. Reviewed how to take the citalopram and pt voiced understanding.

## 2014-12-20 ENCOUNTER — Telehealth: Payer: Self-pay | Admitting: Family Medicine

## 2014-12-20 NOTE — Telephone Encounter (Signed)
Patient contacted and says that the hallucinations have been an issue for some time. He denies any suicidal ideations. Instructed him that he may want to follow up sooner and we will try to get an appointment for him on either Monday or Tuesday with Dr. Livia Snellen to reevaluate his medications. He feels like they are not working sufficiently for his hallucinations. Caryl Pina, MD [facility] 12/20/2014, 10:14 AM

## 2014-12-22 ENCOUNTER — Ambulatory Visit: Payer: Medicare Other | Admitting: Nurse Practitioner

## 2014-12-22 NOTE — Telephone Encounter (Signed)
Pt has appt scheduled 11/30 at 10:55 with Dr.Stacks.

## 2014-12-23 ENCOUNTER — Encounter: Payer: Self-pay | Admitting: Family Medicine

## 2014-12-23 ENCOUNTER — Ambulatory Visit (INDEPENDENT_AMBULATORY_CARE_PROVIDER_SITE_OTHER): Payer: Medicare Other | Admitting: Family Medicine

## 2014-12-23 ENCOUNTER — Ambulatory Visit (INDEPENDENT_AMBULATORY_CARE_PROVIDER_SITE_OTHER): Payer: Medicare Other

## 2014-12-23 VITALS — BP 152/69 | HR 69 | Temp 97.5°F | Ht 73.0 in | Wt 317.0 lb

## 2014-12-23 DIAGNOSIS — E119 Type 2 diabetes mellitus without complications: Secondary | ICD-10-CM | POA: Diagnosis not present

## 2014-12-23 DIAGNOSIS — R441 Visual hallucinations: Secondary | ICD-10-CM

## 2014-12-23 DIAGNOSIS — R0600 Dyspnea, unspecified: Secondary | ICD-10-CM

## 2014-12-23 DIAGNOSIS — R300 Dysuria: Secondary | ICD-10-CM | POA: Diagnosis not present

## 2014-12-23 DIAGNOSIS — F329 Major depressive disorder, single episode, unspecified: Secondary | ICD-10-CM

## 2014-12-23 DIAGNOSIS — E538 Deficiency of other specified B group vitamins: Secondary | ICD-10-CM

## 2014-12-23 DIAGNOSIS — D519 Vitamin B12 deficiency anemia, unspecified: Secondary | ICD-10-CM | POA: Diagnosis not present

## 2014-12-23 DIAGNOSIS — I359 Nonrheumatic aortic valve disorder, unspecified: Secondary | ICD-10-CM | POA: Diagnosis not present

## 2014-12-23 DIAGNOSIS — J208 Acute bronchitis due to other specified organisms: Secondary | ICD-10-CM | POA: Diagnosis not present

## 2014-12-23 DIAGNOSIS — F32A Depression, unspecified: Secondary | ICD-10-CM

## 2014-12-23 NOTE — Progress Notes (Addendum)
Subjective:  Patient ID: [name], male    DOB: [birth year]  Age: 69 y.o. MRN: [id number]  CC: Shortness of Breath   HPI [name] presents for Problems since starting seroquel. It has not gone away even though he discontinued it and has been taking the Abilify. Dyspnea, wheezing, weak. Hallucinations better. Did see a woman watching TV in his house yesterday. He has had a history of B12 deficiency in the remote past he's wondering if that might be the source of his concerns. He is tolerating the Abilify better. However, the dyspnea has not completely resolved.  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   He has past surgical history that includes Appendectomy; Aortic valve replacement (July 2006); Dental surgery (05/2004); Heel spur surgery (Bilateral); Colonoscopy (04/24/2007); Tonsillectomy; bilateral cataract surg; Colonoscopy with esophagogastroduodenoscopy (egd) (N/A, 04/05/2012); Thyroidectomy (03/21/2013); Thyroidectomy (N/A, 03/21/2013); and Knee arthroscopy with lateral menisectomy (Right, 04/04/2014).   His family history includes Diabetes in an other family member.He reports that he quit smoking about 53 years ago. His smoking use included Cigarettes. He has a 6 pack-year smoking history. He has never used smokeless tobacco. He reports that he does not drink alcohol or use illicit drugs.  Outpatient Prescriptions Prior to Visit  Medication Sig Dispense Refill  . acarbose (PRECOSE) 100 MG tablet TAKE ONE TABLET BY MOUTH TWICE DAILY 180 tablet 0  . ARIPiprazole (ABILIFY) 5 MG tablet Take 1 tablet (5 mg total) by mouth daily. 90 tablet 0  . aspirin EC 81 MG  tablet Take 81 mg by mouth daily.    . calcium-vitamin D (OSCAL WITH D) 500-200 MG-UNIT per tablet Take 2 tablets by mouth 2 (two) times daily. (Patient taking differently: Take 1 tablet by mouth 2 (two) times daily. ) 60 tablet 1  . citalopram (CELEXA) 40 MG tablet Take 20 mg by mouth daily.    . furosemide (LASIX) 20 MG tablet Take 2 tablets (40 mg total) by mouth daily. 30 tablet 4  . glipiZIDE (GLUCOTROL) 5 MG tablet Take 1 tablet (5 mg total) by mouth 2 (two) times daily before a meal. 180 tablet 1  . glucose blood test strip Test blood sugar bid. DX E11.9 100 each 5  . insulin detemir (LEVEMIR) 100 UNIT/ML injection Inject 0.9 mLs (90 Units total) into the skin 2 (two) times daily. 60 mL 11  . levothyroxine (SYNTHROID, LEVOTHROID) 200 MCG tablet Take 1 tablet (200 mcg total) by mouth daily before breakfast. 90 tablet 3  . metFORMIN (GLUCOPHAGE) 1000 MG tablet Take 1 tablet (1,000 mg total) by mouth 2 (two) times daily with a meal. 180 tablet 3  . metoprolol tartrate (LOPRESSOR) 25 MG tablet TAKE ONE-HALF TABLET BY MOUTH TWICE DAILY 60 tablet 5  . Multiple Vitamins-Minerals (MENS MULTI VITAMIN & MINERAL PO) Take 1 tablet by mouth daily.      . Omega-3 Fatty Acids (FISH OIL) 1200 MG CAPS Take 1-2 capsules by mouth 2 (two) times daily. 1 capsule by mouth q am, 2 capsules by mouth q pm    . simvastatin (ZOCOR) 80 MG tablet Take 80 mg by mouth at bedtime.      Facility-Administered Medications Prior to Visit  Medication Dose Route Frequency Provider Last Rate Last Dose  . cyanocobalamin ((VITAMIN B-12)) injection 1,000  mcg  1,000 mcg Intramuscular Q30 days Claretta Fraise, MD   1,000 mcg at 12/23/14 1110    ROS Review of Systems  Constitutional: Negative for fever, chills, diaphoresis and unexpected weight change.  HENT: Negative for congestion, hearing loss, rhinorrhea and sore throat.   Eyes: Negative for visual disturbance.  Respiratory: Negative for apnea, cough, chest tightness and  wheezing.   Cardiovascular: Negative for chest pain and leg swelling.  Gastrointestinal: Negative for abdominal pain, diarrhea and constipation.  Genitourinary: Negative for dysuria and flank pain.  Musculoskeletal: Negative for joint swelling and arthralgias.  Skin: Negative for rash.  Neurological: Negative for dizziness and headaches.  Psychiatric/Behavioral: Negative for sleep disturbance and dysphoric mood.    Objective:  BP 152/69 mmHg  Pulse 69  Temp(Src) 97.5 F (36.4 C) (Oral)  Ht '6\' 1"'  (1.854 m)  Wt 317 lb (143.79 kg)  BMI 41.83 kg/m2  SpO2 96%  BP Readings from Last 3 Encounters:  12/23/14 152/69  12/01/14 123/61  11/18/14 125/58    Wt Readings from Last 3 Encounters:  12/23/14 317 lb (143.79 kg)  12/01/14 303 lb 3.2 oz (137.531 kg)  11/18/14 309 lb (140.161 kg)     Physical Exam  Constitutional: He is oriented to person, place, and time. He appears well-developed and well-nourished. No distress.  HENT:  Head: Normocephalic and atraumatic.  Right Ear: External ear normal.  Left Ear: External ear normal.  Nose: Nose normal.  Mouth/Throat: Oropharynx is clear and moist.  Eyes: Conjunctivae and EOM are normal. Pupils are equal, round, and reactive to light.  Neck: Normal range of motion. Neck supple. No thyromegaly present.  Cardiovascular: Normal rate, regular rhythm and normal heart sounds.   No murmur heard. Pulmonary/Chest: Effort normal and breath sounds normal. No respiratory distress. He has no wheezes. He has no rales.  Abdominal: Soft. Bowel sounds are normal. He exhibits no distension. There is no tenderness.  Lymphadenopathy:    He has no cervical adenopathy.  Neurological: He is alert and oriented to person, place, and time. He has normal reflexes.  Skin: Skin is warm and dry.  Psychiatric: He has a normal mood and affect. His behavior is normal. Judgment and thought content normal.    Lab Results  Component Value Date   HGBA1C 6.5  12/01/2014   HGBA1C 6.4 11/10/2014   HGBA1C 7.4 08/06/2014    Lab Results  Component Value Date   WBC 6.9 12/23/2014   HGB 12.9* 08/06/2014   HCT 34.5* 12/23/2014   PLT 157 04/07/2014   GLUCOSE 116* 12/23/2014   CHOL 129 12/01/2014   TRIG 152* 12/01/2014   HDL 38* 12/01/2014   LDLCALC 61 12/01/2014   ALT 12 12/23/2014   AST 18 12/23/2014   NA 138 12/23/2014   K 5.3* 12/23/2014   CL 98 12/23/2014   CREATININE 1.00 12/23/2014   BUN 19 12/23/2014   CO2 27 12/23/2014   TSH 0.902 04/28/2014   PSA 0.2 08/06/2014   HGBA1C 6.5 12/01/2014   MICROALBUR 1.50 04/16/2012     Assessment & Plan:   [name] was seen today for shortness of breath.  Diagnoses and all orders for this visit:  Dyspnea -     CBC with Differential/Platelet -     CMP14+EGFR -     DG Chest 2 View; Future -     PR BREATHING CAPACITY TEST  DM type 2, not at goal [facility])  Depression  Aortic valve disease  Anemia due to vitamin B12 deficiency  B12 deficiency -     Vitamin B12     await results of blood work. Pulmonary function reveals no significant abnormality nor distressed x-ray. There may be an element of psychogenic concern here as well. Follow closely particularly if symptoms worsen.   Normal chest xray- no masses , no infiltrates, no effusions or edema heart not enlarged- Preliminary reading by Claretta Fraise, MD Follow-up: Return in about 2 weeks (around 01/06/2015), or if symptoms worsen or fail to improve.  Claretta Fraise, M.D.

## 2014-12-24 ENCOUNTER — Telehealth: Payer: Self-pay | Admitting: Family Medicine

## 2014-12-24 ENCOUNTER — Ambulatory Visit: Payer: Medicare Other | Admitting: Family Medicine

## 2014-12-24 LAB — CMP14+EGFR
A/G RATIO: 1.7 (ref 1.1–2.5)
ALT: 12 IU/L (ref 0–44)
AST: 18 IU/L (ref 0–40)
Albumin: 4 g/dL (ref 3.5–4.8)
Alkaline Phosphatase: 59 IU/L (ref 39–117)
BILIRUBIN TOTAL: 0.3 mg/dL (ref 0.0–1.2)
BUN/Creatinine Ratio: 19 (ref 10–22)
BUN: 19 mg/dL (ref 8–27)
CALCIUM: 8.9 mg/dL (ref 8.6–10.2)
CHLORIDE: 98 mmol/L (ref 97–106)
CO2: 27 mmol/L (ref 18–29)
Creatinine, Ser: 1 mg/dL (ref 0.76–1.27)
GFR calc Af Amer: 87 mL/min/{1.73_m2} (ref >59)
GFR calc non Af Amer: 75 mL/min/{1.73_m2} (ref >59)
GLUCOSE: 116 mg/dL — AB (ref 65–99)
Globulin, Total: 2.3 g/dL (ref 1.5–4.5)
POTASSIUM: 5.3 mmol/L — AB (ref 3.5–5.2)
Sodium: 138 mmol/L (ref 136–144)
Total Protein: 6.3 g/dL (ref 6.0–8.5)

## 2014-12-24 LAB — CBC WITH DIFFERENTIAL/PLATELET
Basophils Absolute: 0 10*3/uL (ref 0.0–0.2)
Basos: 0 %
EOS (ABSOLUTE): 0.2 10*3/uL (ref 0.0–0.4)
EOS: 2 %
Hematocrit: 34.5 % — ABNORMAL LOW (ref 37.5–51.0)
Hemoglobin: 11.1 g/dL — ABNORMAL LOW (ref 12.6–17.7)
Immature Grans (Abs): 0 10*3/uL (ref 0.0–0.1)
Immature Granulocytes: 1 %
LYMPHS ABS: 1.3 10*3/uL (ref 0.7–3.1)
Lymphs: 18 %
MCH: 30.8 pg (ref 26.6–33.0)
MCHC: 32.2 g/dL (ref 31.5–35.7)
MCV: 96 fL (ref 79–97)
MONOCYTES: 8 %
Monocytes Absolute: 0.6 10*3/uL (ref 0.1–0.9)
NEUTROS ABS: 4.9 10*3/uL (ref 1.4–7.0)
Neutrophils: 71 %
PLATELETS: 184 10*3/uL (ref 150–379)
RBC: 3.6 x10E6/uL — AB (ref 4.14–5.80)
RDW: 16 % — AB (ref 12.3–15.4)
WBC: 6.9 10*3/uL (ref 3.4–10.8)

## 2014-12-24 LAB — VITAMIN B12: Vitamin B-12: >2000 pg/mL — ABNORMAL HIGH (ref 211–946)

## 2014-12-28 ENCOUNTER — Encounter (HOSPITAL_COMMUNITY): Payer: Self-pay | Admitting: Emergency Medicine

## 2014-12-28 ENCOUNTER — Emergency Department (HOSPITAL_COMMUNITY)
Admission: EM | Admit: 2014-12-28 | Discharge: 2014-12-28 | Disposition: A | Discharge status: Home / Self Care | Payer: Medicare Other | Attending: Emergency Medicine | Admitting: Emergency Medicine

## 2014-12-28 ENCOUNTER — Emergency Department (HOSPITAL_COMMUNITY): Payer: Medicare Other

## 2014-12-28 DIAGNOSIS — Z79899 Other long term (current) drug therapy: Secondary | ICD-10-CM | POA: Diagnosis not present

## 2014-12-28 DIAGNOSIS — I10 Essential (primary) hypertension: Secondary | ICD-10-CM | POA: Diagnosis not present

## 2014-12-28 DIAGNOSIS — Z87430 Personal history of prostatic dysplasia: Secondary | ICD-10-CM | POA: Diagnosis not present

## 2014-12-28 DIAGNOSIS — E039 Hypothyroidism, unspecified: Secondary | ICD-10-CM | POA: Diagnosis not present

## 2014-12-28 DIAGNOSIS — R443 Hallucinations, unspecified: Secondary | ICD-10-CM

## 2014-12-28 DIAGNOSIS — Z794 Long term (current) use of insulin: Secondary | ICD-10-CM | POA: Insufficient documentation

## 2014-12-28 DIAGNOSIS — E7800 Pure hypercholesterolemia, unspecified: Secondary | ICD-10-CM | POA: Diagnosis not present

## 2014-12-28 DIAGNOSIS — E109 Type 1 diabetes mellitus without complications: Secondary | ICD-10-CM | POA: Diagnosis not present

## 2014-12-28 DIAGNOSIS — R0600 Dyspnea, unspecified: Secondary | ICD-10-CM | POA: Diagnosis not present

## 2014-12-28 DIAGNOSIS — R600 Localized edema: Secondary | ICD-10-CM | POA: Insufficient documentation

## 2014-12-28 DIAGNOSIS — Z86010 Personal history of colonic polyps: Secondary | ICD-10-CM | POA: Insufficient documentation

## 2014-12-28 DIAGNOSIS — F329 Major depressive disorder, single episode, unspecified: Secondary | ICD-10-CM | POA: Diagnosis not present

## 2014-12-28 DIAGNOSIS — R0602 Shortness of breath: Secondary | ICD-10-CM | POA: Diagnosis not present

## 2014-12-28 DIAGNOSIS — R05 Cough: Secondary | ICD-10-CM | POA: Diagnosis not present

## 2014-12-28 DIAGNOSIS — R441 Visual hallucinations: Secondary | ICD-10-CM | POA: Diagnosis not present

## 2014-12-28 DIAGNOSIS — Z87891 Personal history of nicotine dependence: Secondary | ICD-10-CM | POA: Diagnosis not present

## 2014-12-28 DIAGNOSIS — R1032 Left lower quadrant pain: Secondary | ICD-10-CM | POA: Diagnosis not present

## 2014-12-28 DIAGNOSIS — M1990 Unspecified osteoarthritis, unspecified site: Secondary | ICD-10-CM | POA: Insufficient documentation

## 2014-12-28 DIAGNOSIS — Z7982 Long term (current) use of aspirin: Secondary | ICD-10-CM | POA: Insufficient documentation

## 2014-12-28 DIAGNOSIS — R011 Cardiac murmur, unspecified: Secondary | ICD-10-CM | POA: Insufficient documentation

## 2014-12-28 LAB — URINALYSIS, ROUTINE W REFLEX MICROSCOPIC
Bilirubin Urine: NEGATIVE
Glucose, UA: NEGATIVE mg/dL
Ketones, ur: NEGATIVE mg/dL
Nitrite: NEGATIVE
Protein, ur: NEGATIVE mg/dL
Specific Gravity, Urine: 1.01 (ref 1.005–1.030)
pH: 6 (ref 5.0–8.0)

## 2014-12-28 LAB — CBC WITH DIFFERENTIAL/PLATELET
Basophils Absolute: 0 10*3/uL (ref 0.0–0.1)
Basophils Relative: 0 %
Eosinophils Absolute: 0.2 10*3/uL (ref 0.0–0.7)
Eosinophils Relative: 2 %
HCT: 34.1 % — ABNORMAL LOW (ref 39.0–52.0)
Hemoglobin: 10.9 g/dL — ABNORMAL LOW (ref 13.0–17.0)
Lymphocytes Relative: 17 %
Lymphs Abs: 1.3 10*3/uL (ref 0.7–4.0)
MCH: 31.1 pg (ref 26.0–34.0)
MCHC: 32 g/dL (ref 30.0–36.0)
MCV: 97.4 fL (ref 78.0–100.0)
Monocytes Absolute: 0.5 10*3/uL (ref 0.1–1.0)
Monocytes Relative: 6 %
Neutro Abs: 5.8 10*3/uL (ref 1.7–7.7)
Neutrophils Relative %: 75 %
Platelets: 170 10*3/uL (ref 150–400)
RBC: 3.5 MIL/uL — ABNORMAL LOW (ref 4.22–5.81)
RDW: 16 % — ABNORMAL HIGH (ref 11.5–15.5)
WBC: 7.8 10*3/uL (ref 4.0–10.5)

## 2014-12-28 LAB — BASIC METABOLIC PANEL WITH GFR
Anion gap: 10 (ref 5–15)
BUN: 24 mg/dL — ABNORMAL HIGH (ref 6–20)
CO2: 29 mmol/L (ref 22–32)
Calcium: 8.8 mg/dL — ABNORMAL LOW (ref 8.9–10.3)
Chloride: 97 mmol/L — ABNORMAL LOW (ref 101–111)
Creatinine, Ser: 1.13 mg/dL (ref 0.61–1.24)
GFR calc Af Amer: >60 mL/min (ref >60)
GFR calc non Af Amer: >60 mL/min (ref >60)
Glucose, Bld: 131 mg/dL — ABNORMAL HIGH (ref 65–99)
Potassium: 4.8 mmol/L (ref 3.5–5.1)
Sodium: 136 mmol/L (ref 135–145)

## 2014-12-28 LAB — TROPONIN I: Troponin I: <0.03 ng/mL (ref <0.031)

## 2014-12-28 LAB — URINE MICROSCOPIC-ADD ON

## 2014-12-28 LAB — BRAIN NATRIURETIC PEPTIDE: B Natriuretic Peptide: 237 pg/mL — ABNORMAL HIGH (ref 0.0–100.0)

## 2014-12-28 NOTE — ED Provider Notes (Signed)
CSN: [id number]     Arrival date & time 12/28/14  1159 History  By signing my name below, I, [name], attest that this documentation has been prepared under the direction and in the presence of [name] Manifold, MD. Electronically Signed: [name], ED Scribe. 12/28/2014. 12:33 PM.   Chief Complaint  Patient presents with  . Shortness of Breath  . Hallucinations   The history is provided by the patient. No language interpreter was used.     HPI Comments: [name] is a 69 y.o. male who presents to the Emergency Department complaining of visual hallucinations and worsening shortness of breath x 3 weeks. Pt reports that approximately 3 weeks ago his PCP took him off of Celexa and placed him on Seroquel. Pt cannot say why he was taken off of the Celexa.  After he began taking the Seroquel he felt like he was having difficulty getting his speech out so his PCP placed him on Abilify instead. Pt states that shortly after starting the Abilify he began having visual hallucinations. He mentions that a little boy visited him in his house and then today a man got into his truck and was talking to him. Pt cannot say what the people are saying to him. Pt also complains of LLQ abdominal pain where he injects his insulin and a dry cough. Denies chest pain, back pain, fever, chills, leg swelling, or any other associated symptoms.    Past Medical History  Diagnosis Date  . 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 BPH (benign prostatic hypertrophy) 04/15/2010  . DEPRESSION 01/22/2009  . HYPERCHOLESTEROLEMIA 01/22/2009  . 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 Morbid obesity ([place])   . Heart valve replaced   . Colon polyps   . Ulcer   . Heart murmur   . HYPERTENSION 01/22/2009    dr Casimer Lanius  . Arthritis   . Tremors of nervous system     ?ptsd   Past Surgical History  Procedure Laterality Date  . Appendectomy    . Aortic valve replacement  July 2006    #20 stentless [place] porcine valve  . Dental  surgery  05/2004    Dental extractions  . Heel spur surgery Bilateral     resection of heel spur  . Colonoscopy  04/24/2007    [name]: normal  . Tonsillectomy    . Bilateral cataract surg    . Colonoscopy with esophagogastroduodenoscopy (egd) N/A 04/05/2012    Procedure: COLONOSCOPY WITH ESOPHAGOGASTRODUODENOSCOPY (EGD);  Surgeon: Daneil Dolin, MD;  Location: [facility];  Service: Endoscopy;  Laterality: N/A;  10;15  . Thyroidectomy  03/21/2013    DR [name]  . Thyroidectomy N/A 03/21/2013    Procedure: THYROIDECTOMY;  Surgeon: [name], MD;  Location: [place];  Service: General;  Laterality: N/A;  . Knee arthroscopy with lateral menisectomy Right 04/04/2014    Procedure: KNEE ARTHROSCOPY WITH LATERAL MENISECTOMY;  Surgeon: Carole Civil, MD;  Location: [facility];  Service: Orthopedics;  Laterality: Right;   Family History  Problem Relation Age of Onset  . Diabetes     Social History  Substance Use Topics  . Smoking status: Former Smoker -- 0.50 packs/day for 12 years    Types: Cigarettes    Quit date: 09/20/1961  . Smokeless tobacco: Never Used  . Alcohol Use: No    Review of Systems  Constitutional: Negative for fever and chills.  Respiratory: Positive for cough and shortness of breath.   Cardiovascular: Negative for chest pain and leg  swelling.  Musculoskeletal: Negative for back pain.  Psychiatric/Behavioral: Positive for hallucinations.  All other systems reviewed and are negative.   Allergies  Phenothiazines and Pioglitazone  Home Medications   Prior to Admission medications   Medication Sig Start Date End Date Taking? Authorizing Provider  acarbose (PRECOSE) 100 MG tablet TAKE ONE TABLET BY MOUTH TWICE DAILY 11/04/14   [name] Fraise, MD  ARIPiprazole (ABILIFY) 5 MG tablet Take 1 tablet (5 mg total) by mouth daily. 12/15/14   [name] Fraise, MD  aspirin EC 81 MG tablet Take 81 mg by mouth daily.    Historical Provider, MD  calcium-vitamin D (OSCAL WITH D)  500-200 MG-UNIT per tablet Take 2 tablets by mouth 2 (two) times daily. Patient taking differently: Take 1 tablet by mouth 2 (two) times daily.  03/22/13   [name] Gemma, MD  citalopram (CELEXA) 40 MG tablet Take 20 mg by mouth daily.    Historical Provider, MD  furosemide (LASIX) 20 MG tablet Take 2 tablets (40 mg total) by mouth daily. 09/17/14   Minus Breeding, MD  glipiZIDE (GLUCOTROL) 5 MG tablet Take 1 tablet (5 mg total) by mouth 2 (two) times daily before a meal. 11/24/14   [name] Fraise, MD  glucose blood test strip Test blood sugar bid. DX E11.9 12/02/14   [name] Fraise, MD  insulin detemir (LEVEMIR) 100 UNIT/ML injection Inject 0.9 mLs (90 Units total) into the skin 2 (two) times daily. 08/06/14   [name] Fraise, MD  levothyroxine (SYNTHROID, LEVOTHROID) 200 MCG tablet Take 1 tablet (200 mcg total) by mouth daily before breakfast. 05/27/14   [name] Done, FNP  metFORMIN (GLUCOPHAGE) 1000 MG tablet Take 1 tablet (1,000 mg total) by mouth 2 (two) times daily with a meal. 03/07/14   [name] Done, FNP  metoprolol tartrate (LOPRESSOR) 25 MG tablet TAKE ONE-HALF TABLET BY MOUTH TWICE DAILY 07/02/14   [name] Fraise, MD  Multiple Vitamins-Minerals (MENS MULTI VITAMIN & MINERAL PO) Take 1 tablet by mouth daily.      Historical Provider, MD  Omega-3 Fatty Acids (FISH OIL) 1200 MG CAPS Take 1-2 capsules by mouth 2 (two) times daily. 1 capsule by mouth q am, 2 capsules by mouth q pm    Historical Provider, MD  simvastatin (ZOCOR) 80 MG tablet Take 80 mg by mouth at bedtime.  07/10/14   Historical Provider, MD   Triage Vitals: BP 139/68 mmHg  Pulse 67  Temp(Src) 97.6 F (36.4 C) (Oral)  Resp 24  Ht 6\' 1"  (1.854 m)  Wt 298 lb (135.172 kg)  BMI 39.32 kg/m2  SpO2 97%   Physical Exam  Constitutional: He is oriented to person, place, and time. He appears well-developed and well-nourished.  HENT:  Head: Normocephalic and atraumatic.  Eyes: EOM are normal.  Neck: Normal range of motion.   Cardiovascular: Normal rate, regular rhythm and intact distal pulses.   Murmur heard. Systolic murmur  Pulmonary/Chest: Effort normal. No respiratory distress. He has no wheezes. He has no rales.  Breath sounds diminished bilaterally   Abdominal: Soft. He exhibits no distension. There is tenderness.  Multiple areas with mild induration and erythema at insulin injection sites; minimally tender  Musculoskeletal: Normal range of motion. He exhibits edema.  Mild pitting lower extremity edema  Neurological: He is alert and oriented to person, place, and time.  Skin: Skin is warm and dry.  Nursing note and vitals reviewed.   ED Course  Procedures (including critical care time)  DIAGNOSTIC STUDIES: Oxygen Saturation is 97% on RA,  normal by my interpretation.    COORDINATION OF CARE: 12:14 PM-Discussed treatment plan with pt at bedside and pt agreed to plan.   Labs Review Labs Reviewed  CBC WITH DIFFERENTIAL/PLATELET - Abnormal; Notable for the following:    RBC 3.50 (*)    Hemoglobin 10.9 (*)    HCT 34.1 (*)    RDW 16.0 (*)    All other components within normal limits  BASIC METABOLIC PANEL - Abnormal; Notable for the following:    Chloride 97 (*)    Glucose, Bld 131 (*)    BUN 24 (*)    Calcium 8.8 (*)    All other components within normal limits  BRAIN NATRIURETIC PEPTIDE - Abnormal; Notable for the following:    B Natriuretic Peptide 237.0 (*)    All other components within normal limits  URINALYSIS, ROUTINE W REFLEX MICROSCOPIC (NOT AT [facility]) - Abnormal; Notable for the following:    APPearance HAZY (*)    Hgb urine dipstick SMALL (*)    Leukocytes, UA MODERATE (*)    All other components within normal limits  URINE MICROSCOPIC-ADD ON - Abnormal; Notable for the following:    Squamous Epithelial / LPF 0-5 (*)    Bacteria, UA FEW (*)    All other components within normal limits  TROPONIN I    Imaging Review No results found.   Dg Chest 2 View  01/01/2015  CLINICAL  DATA:  Shortness of breath for 4 days EXAM: CHEST - 2 VIEW COMPARISON:  12/28/2014 FINDINGS: Cardiac shadow is stable. Postoperative changes are again seen. The lungs are well-aerated without focal infiltrate or sizable effusion. Mild vascular prominence is noted centrally. IMPRESSION: Mild vascular congestion.  No acute infiltrate is seen. Electronically Signed   By: Inez Catalina M.D.   On: 01/01/2015 18:04   Dg Chest 2 View  12/28/2014  CLINICAL DATA:  Dyspnea, shortness of breath for 3 weeks with cough. History of diabetes, hypertension, valve replacement, heart murmur. EXAM: CHEST  2 VIEW COMPARISON:  Chest x-rays dated 12/23/2014 and 10/02/2014. FINDINGS: Heart size is upper normal, unchanged. Overall cardiomediastinal silhouette is stable in size and configuration. Median sternotomy wires appear intact and stable in alignment. Mildly prominent interstitial markings are again noted bilaterally, unchanged, presumably chronic. No new lung findings. No evidence of pneumonia. No pleural effusion. No pneumothorax. IMPRESSION: Stable chest x-ray. Chronic mild prominence of the interstitial markings. No evidence of acute cardiopulmonary abnormality. Electronically Signed   By: Franki Cabot M.D.   On: 12/28/2014 14:38   Dg Chest 2 View  12/23/2014  CLINICAL DATA:  Shortness of Breath EXAM: CHEST  2 VIEW COMPARISON:  November 18, 2014 FINDINGS: There is no edema or consolidation. Heart is upper normal in size with pulmonary vascularity within normal limits. No adenopathy. Patient is status post median sternotomy. There are surgical clips in the cervicothoracic junction region midline. There is degenerative change in the thoracic spine. IMPRESSION: No edema or consolidation. Electronically Signed   By: Lowella Grip III M.D.   On: 12/23/2014 14:20     EKG Interpretation   Date/Time:  Sunday December 28 2014 12:10:24 EST Ventricular Rate:  68 PR Interval:    QRS Duration: 129 QT Interval:  442 QTC  Calculation: 470 R Axis:   -73 Text Interpretation:  Junctional rhythm RBBB and LAFB Confirmed by Wilson Singer   MD, Yatzary Merriweather ([id number]) on 12/28/2014 1:35:15 PM      MDM   Final diagnoses:  Hallucination  Dytspnea  69 year old  male with hallucinations which I feel may be secondary to recent medication changes. Patient doesn't appear to be actively responding to internal stimuli on my exam. He is pleasant and cooperative. Advised him to follow up with prescribing physician to discuss the symptoms further. His also complaining of dyspnea. Clinically he does have some mild lower extremity edema. He appears to be in no acute distress though. Chest x-ray without overt edema. Oxygen saturations are normal on room air. He has no significant increased work of breathing. It has been determined that no acute conditions requiring further emergency intervention are present at this time. The patient has been advised of the diagnosis and plan. I reviewed any labs and imaging including any potential incidental findings. We have discussed signs and symptoms that warrant return to the ED and they are listed in the discharge instructions.    I personally preformed the services scribed in my presence. The recorded information has been reviewed is accurate. [name] Manifold, MD.     [name] Manifold, MD 01/02/15 0100

## 2014-12-28 NOTE — ED Notes (Signed)
Patient c/o shortness of breath x3 weeks with cough. Cough dry and nonproductive per patient. Denies any pain or fevers. Patient also reports having hallucinations of people. Denies hearing any voices. Per patient taken off of his Celxa and given pain medication x2-3 weeks ago (unsure of the name). Patient states "I woke up the next day with stroke like symptoms and was unable to move." Patient denies taking any since.

## 2014-12-28 NOTE — Discharge Instructions (Signed)
The hallucinations you have her having may be medication related. Advised that he stop the abilify at this time and discuss with your prescribing doctor as soon as your can.

## 2014-12-29 ENCOUNTER — Telehealth: Payer: Self-pay | Admitting: Family Medicine

## 2014-12-29 NOTE — Telephone Encounter (Signed)
Patient wants to know if you would start seeing him. He doesn't want to see stacks anymore

## 2014-12-29 NOTE — Telephone Encounter (Signed)
Stp and he was seen in the ER this weekend at [facility]. Pt states they took him off Abilify and said to go back on the citalopram. Offered pt an appt to be seen but declined stating he would CB.

## 2014-12-30 ENCOUNTER — Telehealth: Payer: Self-pay | Admitting: Nurse Practitioner

## 2014-12-30 NOTE — Telephone Encounter (Signed)
Yes that is fine

## 2014-12-30 NOTE — Telephone Encounter (Signed)
Stp and advised MMM is booked and the earliest we had with her was Friday so he decided to keep that appt.

## 2014-12-30 NOTE — Telephone Encounter (Signed)
Patient aware and appointment scheduled with Ronnald Collum, FNP.

## 2015-01-01 ENCOUNTER — Encounter (HOSPITAL_COMMUNITY): Payer: Self-pay | Admitting: *Deleted

## 2015-01-01 ENCOUNTER — Encounter: Payer: Self-pay | Admitting: Nurse Practitioner

## 2015-01-01 ENCOUNTER — Inpatient Hospital Stay (HOSPITAL_COMMUNITY)
Admission: EM | Admit: 2015-01-01 | Discharge: 2015-01-03 | DRG: 292 | Disposition: A | Discharge status: Home / Self Care | Payer: Medicare Other | Attending: Internal Medicine | Admitting: Internal Medicine

## 2015-01-01 ENCOUNTER — Ambulatory Visit (INDEPENDENT_AMBULATORY_CARE_PROVIDER_SITE_OTHER): Payer: Medicare Other | Admitting: Nurse Practitioner

## 2015-01-01 ENCOUNTER — Emergency Department (HOSPITAL_COMMUNITY): Payer: Medicare Other

## 2015-01-01 VITALS — BP 146/71 | HR 69 | Temp 96.9°F | Ht 73.0 in | Wt 324.0 lb

## 2015-01-01 DIAGNOSIS — Z794 Long term (current) use of insulin: Secondary | ICD-10-CM | POA: Diagnosis not present

## 2015-01-01 DIAGNOSIS — R140 Abdominal distension (gaseous): Secondary | ICD-10-CM | POA: Diagnosis not present

## 2015-01-01 DIAGNOSIS — R0600 Dyspnea, unspecified: Secondary | ICD-10-CM | POA: Diagnosis present

## 2015-01-01 DIAGNOSIS — Z7984 Long term (current) use of oral hypoglycemic drugs: Secondary | ICD-10-CM

## 2015-01-01 DIAGNOSIS — I5033 Acute on chronic diastolic (congestive) heart failure: Secondary | ICD-10-CM | POA: Diagnosis present

## 2015-01-01 DIAGNOSIS — R635 Abnormal weight gain: Secondary | ICD-10-CM | POA: Diagnosis not present

## 2015-01-01 DIAGNOSIS — Z7982 Long term (current) use of aspirin: Secondary | ICD-10-CM | POA: Diagnosis not present

## 2015-01-01 DIAGNOSIS — R799 Abnormal finding of blood chemistry, unspecified: Secondary | ICD-10-CM

## 2015-01-01 DIAGNOSIS — R7989 Other specified abnormal findings of blood chemistry: Secondary | ICD-10-CM

## 2015-01-01 DIAGNOSIS — R0602 Shortness of breath: Secondary | ICD-10-CM | POA: Diagnosis not present

## 2015-01-01 DIAGNOSIS — M1990 Unspecified osteoarthritis, unspecified site: Secondary | ICD-10-CM | POA: Diagnosis present

## 2015-01-01 DIAGNOSIS — I110 Hypertensive heart disease with heart failure: Secondary | ICD-10-CM | POA: Diagnosis not present

## 2015-01-01 DIAGNOSIS — I509 Heart failure, unspecified: Secondary | ICD-10-CM | POA: Diagnosis present

## 2015-01-01 DIAGNOSIS — Z87891 Personal history of nicotine dependence: Secondary | ICD-10-CM | POA: Diagnosis not present

## 2015-01-01 DIAGNOSIS — D638 Anemia in other chronic diseases classified elsewhere: Secondary | ICD-10-CM | POA: Diagnosis present

## 2015-01-01 DIAGNOSIS — E1140 Type 2 diabetes mellitus with diabetic neuropathy, unspecified: Secondary | ICD-10-CM

## 2015-01-01 DIAGNOSIS — Z79899 Other long term (current) drug therapy: Secondary | ICD-10-CM | POA: Diagnosis not present

## 2015-01-01 DIAGNOSIS — F329 Major depressive disorder, single episode, unspecified: Secondary | ICD-10-CM | POA: Diagnosis present

## 2015-01-01 DIAGNOSIS — R443 Hallucinations, unspecified: Secondary | ICD-10-CM | POA: Insufficient documentation

## 2015-01-01 DIAGNOSIS — Z952 Presence of prosthetic heart valve: Secondary | ICD-10-CM | POA: Diagnosis not present

## 2015-01-01 DIAGNOSIS — R0902 Hypoxemia: Secondary | ICD-10-CM | POA: Diagnosis not present

## 2015-01-01 DIAGNOSIS — Z6839 Body mass index (BMI) 39.0-39.9, adult: Secondary | ICD-10-CM

## 2015-01-01 DIAGNOSIS — Z9842 Cataract extraction status, left eye: Secondary | ICD-10-CM | POA: Diagnosis not present

## 2015-01-01 DIAGNOSIS — Z9841 Cataract extraction status, right eye: Secondary | ICD-10-CM | POA: Diagnosis not present

## 2015-01-01 DIAGNOSIS — N400 Enlarged prostate without lower urinary tract symptoms: Secondary | ICD-10-CM | POA: Diagnosis present

## 2015-01-01 DIAGNOSIS — E039 Hypothyroidism, unspecified: Secondary | ICD-10-CM | POA: Diagnosis present

## 2015-01-01 DIAGNOSIS — R069 Unspecified abnormalities of breathing: Secondary | ICD-10-CM | POA: Diagnosis not present

## 2015-01-01 DIAGNOSIS — I5030 Unspecified diastolic (congestive) heart failure: Secondary | ICD-10-CM

## 2015-01-01 DIAGNOSIS — E7800 Pure hypercholesterolemia, unspecified: Secondary | ICD-10-CM | POA: Diagnosis present

## 2015-01-01 DIAGNOSIS — I5043 Acute on chronic combined systolic (congestive) and diastolic (congestive) heart failure: Secondary | ICD-10-CM | POA: Diagnosis not present

## 2015-01-01 DIAGNOSIS — E119 Type 2 diabetes mellitus without complications: Secondary | ICD-10-CM | POA: Diagnosis not present

## 2015-01-01 DIAGNOSIS — I10 Essential (primary) hypertension: Secondary | ICD-10-CM

## 2015-01-01 LAB — CBC
HCT: 33.3 % — ABNORMAL LOW (ref 39.0–52.0)
HEMOGLOBIN: 10.7 g/dL — AB (ref 13.0–17.0)
MCH: 30.8 pg (ref 26.0–34.0)
MCHC: 32.1 g/dL (ref 30.0–36.0)
MCV: 96 fL (ref 78.0–100.0)
Platelets: 167 10*3/uL (ref 150–400)
RBC: 3.47 MIL/uL — ABNORMAL LOW (ref 4.22–5.81)
RDW: 15.8 % — ABNORMAL HIGH (ref 11.5–15.5)
WBC: 8.3 10*3/uL (ref 4.0–10.5)

## 2015-01-01 LAB — BASIC METABOLIC PANEL
Anion gap: 7 (ref 5–15)
BUN: 20 mg/dL (ref 6–20)
CALCIUM: 8.6 mg/dL — AB (ref 8.9–10.3)
CHLORIDE: 95 mmol/L — AB (ref 101–111)
CO2: 29 mmol/L (ref 22–32)
CREATININE: 1.08 mg/dL (ref 0.61–1.24)
GFR calc Af Amer: >60 mL/min (ref >60)
GFR calc non Af Amer: >60 mL/min (ref >60)
GLUCOSE: 83 mg/dL (ref 65–99)
Potassium: 4.6 mmol/L (ref 3.5–5.1)
Sodium: 131 mmol/L — ABNORMAL LOW (ref 135–145)

## 2015-01-01 LAB — BRAIN NATRIURETIC PEPTIDE: B Natriuretic Peptide: 185.9 pg/mL — ABNORMAL HIGH (ref 0.0–100.0)

## 2015-01-01 LAB — I-STAT TROPONIN, ED: TROPONIN I, POC: 0.01 ng/mL (ref 0.00–0.08)

## 2015-01-01 LAB — GLUCOSE, CAPILLARY
Glucose-Capillary: 55 mg/dL — ABNORMAL LOW (ref 65–99)
Glucose-Capillary: 147 mg/dL — ABNORMAL HIGH (ref 65–99)

## 2015-01-01 MED ORDER — INSULIN DETEMIR 100 UNIT/ML ~~LOC~~ SOLN
40.0000 [IU] | Freq: Two times a day (BID) | SUBCUTANEOUS | Status: DC
  Administered 2015-01-01 – 2015-01-02 (×2): 40 [IU] via SUBCUTANEOUS
  Filled 2015-01-01 (×3): qty 0.4

## 2015-01-01 MED ORDER — ASPIRIN EC 81 MG PO TBEC
81.0000 mg | DELAYED_RELEASE_TABLET | Freq: Every day | ORAL | Status: DC
  Administered 2015-01-01 – 2015-01-03 (×3): 81 mg via ORAL
  Filled 2015-01-01 (×3): qty 1

## 2015-01-01 MED ORDER — FUROSEMIDE 10 MG/ML IJ SOLN
60.0000 mg | Freq: Two times a day (BID) | INTRAMUSCULAR | Status: DC
  Administered 2015-01-02 (×2): 60 mg via INTRAVENOUS
  Filled 2015-01-01 (×2): qty 6

## 2015-01-01 MED ORDER — METOPROLOL TARTRATE 12.5 MG HALF TABLET
12.5000 mg | ORAL_TABLET | Freq: Two times a day (BID) | ORAL | Status: DC
Start: 2015-01-01 — End: 2015-01-03
  Administered 2015-01-01 – 2015-01-03 (×4): 12.5 mg via ORAL
  Filled 2015-01-01 (×4): qty 1

## 2015-01-01 MED ORDER — CITALOPRAM HYDROBROMIDE 20 MG PO TABS
20.0000 mg | ORAL_TABLET | Freq: Every day | ORAL | Status: DC
  Administered 2015-01-02 – 2015-01-03 (×2): 20 mg via ORAL
  Filled 2015-01-01 (×2): qty 1

## 2015-01-01 MED ORDER — CALCIUM CARBONATE-VITAMIN D 500-200 MG-UNIT PO TABS
2.0000 | ORAL_TABLET | Freq: Two times a day (BID) | ORAL | Status: DC
  Administered 2015-01-01 – 2015-01-03 (×4): 2 via ORAL
  Filled 2015-01-01: qty 2
  Filled 2015-01-01: qty 1
  Filled 2015-01-01 (×3): qty 2

## 2015-01-01 MED ORDER — FUROSEMIDE 10 MG/ML IJ SOLN
40.0000 mg | Freq: Once | INTRAMUSCULAR | Status: AC
  Administered 2015-01-01: 40 mg via INTRAVENOUS
  Filled 2015-01-01: qty 4

## 2015-01-01 MED ORDER — HEPARIN SODIUM (PORCINE) 5000 UNIT/ML IJ SOLN
5000.0000 [IU] | Freq: Three times a day (TID) | INTRAMUSCULAR | Status: DC
  Administered 2015-01-01 – 2015-01-03 (×4): 5000 [IU] via SUBCUTANEOUS
  Filled 2015-01-01 (×4): qty 1

## 2015-01-01 MED ORDER — LEVOTHYROXINE SODIUM 200 MCG PO TABS
200.0000 ug | ORAL_TABLET | Freq: Every day | ORAL | Status: DC
  Administered 2015-01-02: 200 ug via ORAL
  Filled 2015-01-01: qty 1
  Filled 2015-01-01: qty 2
  Filled 2015-01-01: qty 1

## 2015-01-01 MED ORDER — ATORVASTATIN CALCIUM 40 MG PO TABS
40.0000 mg | ORAL_TABLET | Freq: Every day | ORAL | Status: DC
  Administered 2015-01-02: 40 mg via ORAL
  Filled 2015-01-01: qty 1

## 2015-01-01 MED ORDER — TAMSULOSIN HCL 0.4 MG PO CAPS
0.4000 mg | ORAL_CAPSULE | Freq: Every day | ORAL | Status: DC
  Administered 2015-01-01 – 2015-01-03 (×3): 0.4 mg via ORAL
  Filled 2015-01-01 (×3): qty 1

## 2015-01-01 NOTE — ED Notes (Signed)
Attempted to call report

## 2015-01-01 NOTE — Progress Notes (Signed)
Attempted to get report. 

## 2015-01-01 NOTE — Progress Notes (Signed)
Pt admitted from ED with CHF, pt a/o, no c/o pain, pt on 2L nasal canula, upon arrival CBG was 55, pt given snack and CBG came up to 147, VSS, pt resting comfortably

## 2015-01-01 NOTE — H&P (Signed)
Date: 01/01/2015               Patient Name:  [name] MRN: [id number]  DOB: [birth year] Age / Sex: 69 y.o., male   PCP: Claretta Fraise, MD         Medical Service: Internal Medicine Teaching Service         Attending Physician: Dr. Gilles Chiquito    First Contact: Dr. Maryellen Pile Pager: [phone]  Second Contact: Dr. Randell Patient Pager: [phone]       After Hours (After 5p/  First Contact Pager: [phone]  weekends / holidays): Second Contact Pager: [phone]   Chief Complaint: shortness of breath and hallucinations   History of Present Illness:   This is a 69 yo man with HFpEF, aortic valve replacement, T2DM, depression, normocytic anemia, who came in with 3 week history of shortness of breath and fluid retention. He says he normally weighs 280 lbs but has noticed progressive fluid retention and 25-30 lbs weight gain. He was seen earlier today by a family NP who sent him here who noticed these things.  He says he has dry non productive cough, and he can walk 10-15 feet before getting short of breath but this is not new. The acute HF has not happened before and he has a cardiologist who he saw 6 months back.  He has a stable 2 pillow orthopnea, but says he mostly lays up in a chair even at night. He denies PND.  He lives by himself, and tries to both salt restrict and fluid restrict his diet.  He had an echo done in February which showed EF of 55-50%  He denies fevers, chills, headaches, abd pain, hematochezia, melena, dysuria, hematuria, leg pains. He denies chest pain, or pressure or lightheadedness or dizziness.  In August his cardiologist dr [name] decreased his lasix to 20 mg bid.   His other concern was the hallucinations ongoing for last 2 months or so. He sees different men during darkness but not during daytime. He has not had them today. Per chart review, he is having them for 2 months or so. He says 3 weeks ago, his PCP started him on seroquel and he only took it once, then  felt like he had a stroke  As he was not able to talk- did not see anyone and then was fine after that day (maybe he had a TIA? As he has a lot of risk factors). Then his PCP put him on Abilify which made him feel "funny" so he did not take it so now he is only on celexa. He said he did not have hallucinations today. Normally he has them twice a day, mainly at night when he sees people.   In the ER, his weight was 319, he was on 2L oxygen stting 97%. Labs were notable for a BNP of 185, and 237 4 days ago. cxr showing mild vascular congestion   FH: diabetes and heart disease SH: nonsmoker and non-drinker, lives by himself   Meds: Current Facility-Administered Medications  Medication Dose Route Frequency Provider Last Rate Last Dose  . cyanocobalamin ((VITAMIN B-12)) injection 1,000 mcg  1,000 mcg Intramuscular Q30 days Claretta Fraise, MD   1,000 mcg at 12/23/14 1110  . heparin injection 5,000 Units  5,000 Units Subcutaneous 3 times per day Norman Herrlich, MD       Current Outpatient Prescriptions  Medication Sig Dispense Refill  . acarbose (PRECOSE) 100 MG tablet TAKE ONE TABLET BY MOUTH  TWICE DAILY 180 tablet 0  . aspirin EC 81 MG tablet Take 81 mg by mouth daily.    . calcium-vitamin D (OSCAL WITH D) 500-200 MG-UNIT per tablet Take 2 tablets by mouth 2 (two) times daily. (Patient taking differently: Take 1 tablet by mouth 2 (two) times daily. ) 60 tablet 1  . furosemide (LASIX) 20 MG tablet Take 2 tablets (40 mg total) by mouth daily. (Patient taking differently: Take 20 mg by mouth 2 (two) times daily. ) 30 tablet 4  . glipiZIDE (GLUCOTROL) 5 MG tablet Take 1 tablet (5 mg total) by mouth 2 (two) times daily before a meal. 180 tablet 1  . insulin detemir (LEVEMIR) 100 UNIT/ML injection Inject 0.9 mLs (90 Units total) into the skin 2 (two) times daily. 60 mL 11  . levothyroxine (SYNTHROID, LEVOTHROID) 200 MCG tablet Take 1 tablet (200 mcg total) by mouth daily before breakfast. 90 tablet 3  .  metFORMIN (GLUCOPHAGE) 1000 MG tablet Take 1 tablet (1,000 mg total) by mouth 2 (two) times daily with a meal. 180 tablet 3  . metoprolol tartrate (LOPRESSOR) 25 MG tablet TAKE ONE-HALF TABLET BY MOUTH TWICE DAILY 60 tablet 5  . Multiple Vitamins-Minerals (MENS MULTI VITAMIN & MINERAL PO) Take 1 tablet by mouth daily.      . Omega-3 Fatty Acids (FISH OIL) 1200 MG CAPS Take 1-2 capsules by mouth 2 (two) times daily. 1 capsule by mouth q am, 2 capsules by mouth q pm    . simvastatin (ZOCOR) 80 MG tablet Take 40 mg by mouth at bedtime.     . tamsulosin (FLOMAX) 0.4 MG CAPS capsule Take 0.4 mg by mouth daily.    Marland Kitchen glucose blood test strip Test blood sugar bid. DX E11.9 100 each 5    Allergies: Allergies as of 01/01/2015 - Review Complete 01/01/2015  Allergen Reaction Noted  . Phenothiazines Anaphylaxis 04/15/2010  . Pioglitazone Swelling 04/03/2009   Past Medical History  Diagnosis Date  . 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 BPH (benign prostatic hypertrophy) 04/15/2010  . DEPRESSION 01/22/2009  . HYPERCHOLESTEROLEMIA 01/22/2009  . 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 Morbid obesity ([place])   . Heart valve replaced   . Colon polyps   . Ulcer   . Heart murmur   . HYPERTENSION 01/22/2009    dr Casimer Lanius  . Arthritis   . Tremors of nervous system     ?ptsd   Past Surgical History  Procedure Laterality Date  . Appendectomy    . Aortic valve replacement  July 2006    #20 stentless [place] porcine valve  . Dental surgery  05/2004    Dental extractions  . Heel spur surgery Bilateral     resection of heel spur  . Colonoscopy  04/24/2007    [name]: normal  . Tonsillectomy    . Bilateral cataract surg    . Colonoscopy with esophagogastroduodenoscopy (egd) N/A 04/05/2012    Procedure: COLONOSCOPY WITH ESOPHAGOGASTRODUODENOSCOPY (EGD);  Surgeon: Daneil Dolin, MD;  [facility];  Service: Endoscopy;  Laterality: N/A;  10;15  . Thyroidectomy  03/21/2013    DR [name]  . Thyroidectomy N/A  03/21/2013    Procedure: THYROIDECTOMY;  Surgeon: [name], MD;  Location: [place] [facility];  Service: General;  Laterality: N/A;  . Knee arthroscopy with lateral menisectomy Right 04/04/2014    Procedure: KNEE ARTHROSCOPY WITH LATERAL MENISECTOMY;  Surgeon: Carole Civil, MD;  Location: [facility];  Service: Orthopedics;  Laterality: Right;   Family History  Problem Relation Age of Onset  . Diabetes     Social History   Social History  . Marital Status: Single    Spouse Name: N/A  . Number of Children: 1  . Years of Education: HS   Occupational History  . Truck Geophysicist/field seismologist, retired    Social History Main Topics  . Smoking status: Former Smoker -- 0.50 packs/day for 12 years    Types: Cigarettes    Quit date: 09/20/1961  . Smokeless tobacco: Never Used  . Alcohol Use: No  . Drug Use: No  . Sexual Activity: No     Comment: Has not smoked in 20 years   Other Topics Concern  . Not on file   Social History Narrative   Lives alone.   Quit smoking approximately 28 years ago.   Long haul truck driver, lives in [place].    Review of Systems: Comprehensive ROS listed in HPI, others negative.  Physical Exam: Blood pressure 167/147, pulse 66, temperature 97.9 F (36.6 C), temperature source Oral, resp. rate 20, height 6\' 1"  (1.854 m), weight 319 lb 6.4 oz (144.879 kg), SpO2 98 %.  General: A&O, in NAD, satting 97% on 2L oxygen HEENT: EOMI, PERRLA, sclera white, conjunctiva pink, MMM, no ear/nose/throat erythema Neck: obese,  CV: RRR, normal s1, s2, no m/r/g, no JVD appreciated Resp: distant breath sounds due to morbid obesity, poor inspiratory effort, unable to hear crackles or wheezing Abdomen: obese, nontender, distended, +BS in all 4 quadrants, Skin: warm, dry, intact, has chronic venous statis changes in his legs  Extremities: 2+ pitting edema in legs, normal pulses  Neurologic: no focal neuro deficits- patient is alert to name, time, date and year  CN II-XII grossly  intact Normal sensory and motor exam in all extremities  Lab results: Results for orders placed or performed during the hospital encounter of 01/01/15 (from the past 24 hour(s))  Basic metabolic panel     Status: Abnormal   Collection Time: 01/01/15  5:23 PM  Result Value Ref Range   Sodium 131 (L) 135 - 145 mmol/L   Potassium 4.6 3.5 - 5.1 mmol/L   Chloride 95 (L) 101 - 111 mmol/L   CO2 29 22 - 32 mmol/L   Glucose, Bld 83 65 - 99 mg/dL   BUN 20 6 - 20 mg/dL   Creatinine, Ser 1.08 0.61 - 1.24 mg/dL   Calcium 8.6 (L) 8.9 - 10.3 mg/dL   GFR calc non Af Amer >60 >60 mL/min   GFR calc Af Amer >60 >60 mL/min   Anion gap 7 5 - 15  CBC     Status: Abnormal   Collection Time: 01/01/15  5:23 PM  Result Value Ref Range   WBC 8.3 4.0 - 10.5 K/uL   RBC 3.47 (L) 4.22 - 5.81 MIL/uL   Hemoglobin 10.7 (L) 13.0 - 17.0 g/dL   HCT 33.3 (L) 39.0 - 52.0 %   MCV 96.0 78.0 - 100.0 fL   MCH 30.8 26.0 - 34.0 pg   MCHC 32.1 30.0 - 36.0 g/dL   RDW 15.8 (H) 11.5 - 15.5 %   Platelets 167 150 - 400 K/uL  Brain natriuretic peptide     Status: Abnormal   Collection Time: 01/01/15  5:23 PM  Result Value Ref Range   B Natriuretic Peptide 185.9 (H) 0.0 - 100.0 pg/mL  I-stat troponin, ED (not at [facility], [facility])     Status: None   Collection Time: 01/01/15  5:25 PM  Result Value  Ref Range   Troponin i, poc 0.01 0.00 - 0.08 ng/mL   Comment 3             Imaging results:  Dg Chest 2 View  01/01/2015  CLINICAL DATA:  Shortness of breath for 4 days EXAM: CHEST - 2 VIEW COMPARISON:  12/28/2014 FINDINGS: Cardiac shadow is stable. Postoperative changes are again seen. The lungs are well-aerated without focal infiltrate or sizable effusion. Mild vascular prominence is noted centrally. IMPRESSION: Mild vascular congestion.  No acute infiltrate is seen. Electronically Signed   By: Inez Catalina M.D.   On: 01/01/2015 18:04    Other results: EKG: normal EKG, normal sinus rhythm, unchanged from previous  tracings.  Assessment & Plan by Problem: Active Problems:   Diastolic CHF, acute on chronic (HCC)   CHF exacerbation (HCC)  HFpEF exacerbation: Pt with 3 week history of shortness of breath, some abdominal distension,  25-30 lbs chronic weight gain and baseline weight of around 280, CXR showing mild vascular congestion, and BNP of around 200. This seems to be a chronic issue than an acute shortness of breath,   -recd lasix 40 mg iv in ER, lasix 60 mg iv bid daily - lopressor to 12.5 mg bid  -repeat echo -strict I&O -daily weights -repeat TSH -repeat BMET  Complex Visual Hallucinations: Unclear etiology, normal neurological exam and a&o x4. Pt is aware that they are hallucinations. Per chart review, he is having them for 2 months or so. He says 3 weeks ago, his PCP started him on seroquel and he only took it once, then felt like he had a stroke  As he was not able to talk- did not see anyone and then was fine after that day (maybe he had a TIA? As he has a lot of risk factors). Then his PCP put him on Abilify which made him feel "funny" so he did not take it so now he is only on celexa, as he thinks the abilify caused hallucinations. He said he did not have hallucinations today. Normally he has them twice a day, mainly at night when he sees people. Differentials for hallucinations include infection/UTI, but his UA is clear though he had UTI in the past, polypharmacy- as he had several antidepressants/antipsychotics meds started and stopped, migraine, neurodegenerative disease, alcohol and drug use, psychiatric illness, and toxic-metabolic encephalopathy, or his sleep apnea He possible could have dementia with lewy body, or PD.     His B12 is normal "high". His liver enzymes were normal in November   -repeat TSH -TSH was high at around 5.3 so ordered free t4 -consider head CT -he will need outpatient sleep study for sleep apnea evaluation -celexa daily for depression   Asymptomatic  normocytic anemia: Pt with no symptoms of lightheadedness or dizziness. Hemoglobin appears to be chronically low around 10.5 with normal MCV.   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 Anticipated discharge in approximately 2 day(s).   The patient does have a current PCP Claretta Fraise, MD) and does need an  [facility] hospital follow-up appointment after discharge.  The patient does not have transportation limitations that hinder transportation to clinic appointments.  Signed: Burgess Estelle, MD 01/01/2015, 9:15 PM

## 2015-01-01 NOTE — ED Notes (Signed)
Pt presents via [facility] from  [facility] for increasing SOB.  Pt has gained 26 lbs in the last 4 days per EMS.  Pt reports abdominal distension, abdomen seems distended and taut.  Rhochi in the bases, hx CHF, takes lasik daily.  Pt denies pain, a x 4, speaks in full sentences.  EKG unremarkable, O2-98% on 2L.

## 2015-01-01 NOTE — Progress Notes (Signed)
   Subjective:    Patient ID: [name], male    DOB: [birth year], 69 y.o.   MRN: [id number]  HPI Patient in today with c/o hallucinations- Dr. Livia Snellen put him seroquel- he took one dose of it and he woke up the next morning and said he couldn't talk and felt like he had a stroke. He did not take anymore seroquel and came back in and saw Dr. Livia Snellen and he then put him on abilify which made heim feel funny. He put hisself back on celexa. He said at night he says he is seeing people. Has been going on every since he took that one dose of seroquel. He says he knows something ain't right.   * He went to the ER Sunday because he felt so bad- weak, dizziness and unsteady on his feet- he said all they did was a chest x ray.    Review of Systems  Constitutional: Positive for appetite change (decreased) and fatigue.  HENT: Negative.   Respiratory: Negative.   Cardiovascular: Negative.   Gastrointestinal: Negative.   Genitourinary: Negative.   Musculoskeletal: Negative.   Neurological: Positive for speech difficulty and weakness. Negative for light-headedness.  Psychiatric/Behavioral: Positive for hallucinations. The patient is nervous/anxious.   All other systems reviewed and are negative.      Objective:   Physical Exam  Constitutional: He is oriented to person, place, and time. He appears well-developed and well-nourished.  Cardiovascular: Normal rate, regular rhythm and normal heart sounds.   Pulmonary/Chest: Effort normal. He has rales (bil bases).  Neurological: He is alert and oriented to person, place, and time. He has normal reflexes. A cranial nerve deficit is present.  Speech slurred Unsteady gait Patient just does not seem like hisself  Skin: Skin is warm and dry.  Psychiatric: He has a normal mood and affect. His behavior is normal. Judgment and thought content normal.   BP 146/71 mmHg  Pulse 69  Temp(Src) 96.9 F (36.1 C) (Oral)  Ht 6\' 1"  (1.854 m)  Wt 324 lb (146.965  kg)  BMI 42.76 kg/m2  SpO2 98%       Assessment & Plan:   1. Dyspnea   2. Elevated brain natriuretic peptide (BNP) level   3. Congestive heart failure, unspecified congestive heart failure chronicity, unspecified congestive heart failure type ([place])   4. Weight gain    26lb weight gain in 4 days Needs echo Needs to be diuresed To ER via EMS  5. Hallucinations - not sure what is causing this- should be evaluated while at hospital  [place], [place]

## 2015-01-01 NOTE — ED Provider Notes (Signed)
CSN: [id number]     Arrival date & time 01/01/15  1653 History   First MD Initiated Contact with Patient 01/01/15 1703     Chief Complaint  Patient presents with  . Shortness of Breath  . Congestive Heart Failure     (Consider location/radiation/quality/duration/timing/severity/associated sxs/prior Treatment) HPI Comments: Pt comes in from PCP clinic via EMS with cc of dib. Pt has hx of diastoic CHF, Valvular disorder, IDDM. Pt reports that over the past 3 weeks he has been having increased dib - now with minimal exertion. He also has noticed significant weight gain - upwards of 25-30 lbs (weight 286 lbs) over the past month. Pt has been taking his meds as prescribed. He has no chest pain with the dyspnea. No new cough.   ROS 10 Systems reviewed and are negative for acute change except as noted in the HPI.     The history is provided by the patient.    Past Medical History  Diagnosis Date  . 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 BPH (benign prostatic hypertrophy) 04/15/2010  . DEPRESSION 01/22/2009  . HYPERCHOLESTEROLEMIA 01/22/2009  . 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 Morbid obesity ([place])   . Heart valve replaced   . Colon polyps   . Ulcer   . Heart murmur   . HYPERTENSION 01/22/2009    dr Casimer Lanius  . Arthritis   . Tremors of nervous system     ?ptsd   Past Surgical History  Procedure Laterality Date  . Appendectomy    . Aortic valve replacement  July 2006    #20 stentless [place] porcine valve  . Dental surgery  05/2004    Dental extractions  . Heel spur surgery Bilateral     resection of heel spur  . Colonoscopy  04/24/2007    [name]: normal  . Tonsillectomy    . Bilateral cataract surg    . Colonoscopy with esophagogastroduodenoscopy (egd) N/A 04/05/2012    Procedure: COLONOSCOPY WITH ESOPHAGOGASTRODUODENOSCOPY (EGD);  Surgeon: Daneil Dolin, MD;  [facility];  Service: Endoscopy;  Laterality: N/A;  10;15  . Thyroidectomy  03/21/2013    DR [name]  .  Thyroidectomy N/A 03/21/2013    Procedure: THYROIDECTOMY;  Surgeon: [name], MD;  Location: [facility];  Service: General;  Laterality: N/A;  . Knee arthroscopy with lateral menisectomy Right 04/04/2014    Procedure: KNEE ARTHROSCOPY WITH LATERAL MENISECTOMY;  Surgeon: Carole Civil, MD;  Location: [facility];  Service: Orthopedics;  Laterality: Right;   Family History  Problem Relation Age of Onset  . Diabetes     Social History  Substance Use Topics  . Smoking status: Former Smoker -- 0.50 packs/day for 12 years    Types: Cigarettes    Quit date: 09/20/1961  . Smokeless tobacco: Never Used  . Alcohol Use: No    Review of Systems  Respiratory: Positive for shortness of breath.   All other systems reviewed and are negative.     Allergies  Phenothiazines and Pioglitazone  Home Medications   Prior to Admission medications   Medication Sig Start Date End Date Taking? Authorizing Provider  acarbose (PRECOSE) 100 MG tablet TAKE ONE TABLET BY MOUTH TWICE DAILY 11/04/14  Yes Claretta Fraise, MD  aspirin EC 81 MG tablet Take 81 mg by mouth daily.   Yes Historical Provider, MD  calcium-vitamin D (OSCAL WITH D) 500-200 MG-UNIT per tablet Take 2 tablets by mouth 2 (two) times daily. Patient taking differently: Take 1 tablet by mouth 2 (  two) times daily.  03/22/13  Yes Armandina Gemma, MD  furosemide (LASIX) 20 MG tablet Take 2 tablets (40 mg total) by mouth daily. Patient taking differently: Take 20 mg by mouth 2 (two) times daily.  09/17/14  Yes Minus Breeding, MD  glipiZIDE (GLUCOTROL) 5 MG tablet Take 1 tablet (5 mg total) by mouth 2 (two) times daily before a meal. 11/24/14  Yes Claretta Fraise, MD  insulin detemir (LEVEMIR) 100 UNIT/ML injection Inject 0.9 mLs (90 Units total) into the skin 2 (two) times daily. 08/06/14  Yes Claretta Fraise, MD  levothyroxine (SYNTHROID, LEVOTHROID) 200 MCG tablet Take 1 tablet (200 mcg total) by mouth daily before breakfast. 05/27/14  Yes Mary-Margaret Hassell Done,  FNP  metFORMIN (GLUCOPHAGE) 1000 MG tablet Take 1 tablet (1,000 mg total) by mouth 2 (two) times daily with a meal. 03/07/14  Yes Mary-Margaret Hassell Done, FNP  metoprolol tartrate (LOPRESSOR) 25 MG tablet TAKE ONE-HALF TABLET BY MOUTH TWICE DAILY 07/02/14  Yes Claretta Fraise, MD  Multiple Vitamins-Minerals (MENS MULTI VITAMIN & MINERAL PO) Take 1 tablet by mouth daily.     Yes Historical Provider, MD  Omega-3 Fatty Acids (FISH OIL) 1200 MG CAPS Take 1-2 capsules by mouth 2 (two) times daily. 1 capsule by mouth q am, 2 capsules by mouth q pm   Yes Historical Provider, MD  simvastatin (ZOCOR) 80 MG tablet Take 40 mg by mouth at bedtime.  07/10/14  Yes Historical Provider, MD  tamsulosin (FLOMAX) 0.4 MG CAPS capsule Take 0.4 mg by mouth daily.   Yes Historical Provider, MD  glucose blood test strip Test blood sugar bid. DX E11.9 12/02/14   Claretta Fraise, MD   BP 138/63 mmHg  Pulse 64  Temp(Src) 97.9 F (36.6 C) (Oral)  Resp 17  Ht 6\' 1"  (1.854 m)  Wt 319 lb 6.4 oz (144.879 kg)  BMI 42.15 kg/m2  SpO2 100% Physical Exam  Constitutional: He is oriented to person, place, and time. He appears well-developed.  HENT:  Head: Normocephalic and atraumatic.  Eyes: Conjunctivae and EOM are normal. Pupils are equal, round, and reactive to light.  Neck: Normal range of motion. Neck supple.  Cardiovascular: Normal rate and regular rhythm.   Pulmonary/Chest: Effort normal and breath sounds normal. No respiratory distress.  Bibasilar rales  Abdominal: Soft. Bowel sounds are normal. He exhibits no distension. There is no tenderness. There is no rebound and no guarding.  Musculoskeletal: He exhibits edema.  2+ pitting edema  Neurological: He is alert and oriented to person, place, and time.  Skin: Skin is warm.  Nursing note and vitals reviewed.   ED Course  Procedures (including critical care time) Labs Review Labs Reviewed  BASIC METABOLIC PANEL - Abnormal; Notable for the following:    Sodium 131 (*)     Chloride 95 (*)    Calcium 8.6 (*)    All other components within normal limits  CBC - Abnormal; Notable for the following:    RBC 3.47 (*)    Hemoglobin 10.7 (*)    HCT 33.3 (*)    RDW 15.8 (*)    All other components within normal limits  BRAIN NATRIURETIC PEPTIDE  I-STAT TROPOININ, ED    Imaging Review Dg Chest 2 View  01/01/2015  CLINICAL DATA:  Shortness of breath for 4 days EXAM: CHEST - 2 VIEW COMPARISON:  12/28/2014 FINDINGS: Cardiac shadow is stable. Postoperative changes are again seen. The lungs are well-aerated without focal infiltrate or sizable effusion. Mild vascular prominence is noted centrally.  IMPRESSION: Mild vascular congestion.  No acute infiltrate is seen. Electronically Signed   By: Inez Catalina M.D.   On: 01/01/2015 18:04   I have personally reviewed and evaluated these images and lab results as part of my medical decision-making.   EKG Interpretation   Date/Time:  Thursday January 01 2015 16:56:51 EST Ventricular Rate:  63 PR Interval:    QRS Duration: 132 QT Interval:  466 QTC Calculation: 477 R Axis:   -65 Text Interpretation:  Junctional rhythm RBBB and LAFB No acute changes  Confirmed by Kathrynn Humble, MD, Thelma Comp [phone]) on 01/01/2015 8:01:27 PM      MDM   Final diagnoses:  Acute on chronic combined systolic and diastolic congestive heart failure ([place])    Pt comes in with cc of dib. Pt has hx of diastolic CHF, exertional dyspnea, and rales on exam with significant weight gain. Initial clinical concern is that pt indeed has acute CHF. He was sent here to be admitted by the PCP team. Pt's CXR shows mild congestion and there is no hypoxia, but given the amount of weight gain we will admit him for optimization.     Varney Biles, MD 01/01/15 2003

## 2015-01-01 NOTE — ED Notes (Signed)
MD at bedside. 

## 2015-01-02 ENCOUNTER — Ambulatory Visit: Payer: Medicare Other | Admitting: Nurse Practitioner

## 2015-01-02 ENCOUNTER — Inpatient Hospital Stay (HOSPITAL_COMMUNITY): Payer: Medicare Other

## 2015-01-02 ENCOUNTER — Other Ambulatory Visit: Payer: Self-pay | Admitting: Nurse Practitioner

## 2015-01-02 DIAGNOSIS — E119 Type 2 diabetes mellitus without complications: Secondary | ICD-10-CM

## 2015-01-02 DIAGNOSIS — R0600 Dyspnea, unspecified: Secondary | ICD-10-CM

## 2015-01-02 DIAGNOSIS — I5033 Acute on chronic diastolic (congestive) heart failure: Secondary | ICD-10-CM

## 2015-01-02 DIAGNOSIS — I10 Essential (primary) hypertension: Secondary | ICD-10-CM

## 2015-01-02 DIAGNOSIS — R443 Hallucinations, unspecified: Secondary | ICD-10-CM

## 2015-01-02 LAB — BASIC METABOLIC PANEL
Anion gap: 6 (ref 5–15)
BUN: 19 mg/dL (ref 6–20)
CHLORIDE: 95 mmol/L — AB (ref 101–111)
CO2: 31 mmol/L (ref 22–32)
CREATININE: 1.1 mg/dL (ref 0.61–1.24)
Calcium: 9 mg/dL (ref 8.9–10.3)
GFR calc Af Amer: >60 mL/min (ref >60)
GFR calc non Af Amer: >60 mL/min (ref >60)
GLUCOSE: 130 mg/dL — AB (ref 65–99)
POTASSIUM: 4.5 mmol/L (ref 3.5–5.1)
SODIUM: 132 mmol/L — AB (ref 135–145)

## 2015-01-02 LAB — GLUCOSE, CAPILLARY
GLUCOSE-CAPILLARY: 107 mg/dL — AB (ref 65–99)
GLUCOSE-CAPILLARY: 92 mg/dL (ref 65–99)
GLUCOSE-CAPILLARY: 210 mg/dL — AB (ref 65–99)
GLUCOSE-CAPILLARY: 153 mg/dL — AB (ref 65–99)
Glucose-Capillary: 57 mg/dL — ABNORMAL LOW (ref 65–99)

## 2015-01-02 LAB — TSH: TSH: 5.237 u[IU]/mL — ABNORMAL HIGH (ref 0.350–4.500)

## 2015-01-02 LAB — FERRITIN: Ferritin: 411 ng/mL — ABNORMAL HIGH (ref 24–336)

## 2015-01-02 LAB — T4, FREE: FREE T4: 0.96 ng/dL (ref 0.61–1.12)

## 2015-01-02 MED ORDER — LEVOTHYROXINE SODIUM 50 MCG PO TABS: 225.0000 ug | ORAL_TABLET | Freq: Every day | ORAL | Status: DC

## 2015-01-02 MED ORDER — INSULIN DETEMIR 100 UNIT/ML ~~LOC~~ SOLN
35.0000 [IU] | Freq: Two times a day (BID) | SUBCUTANEOUS | Status: DC
  Administered 2015-01-02: 35 [IU] via SUBCUTANEOUS
  Filled 2015-01-02 (×2): qty 0.35

## 2015-01-02 MED ORDER — INSULIN ASPART 100 UNIT/ML ~~LOC~~ SOLN
0.0000 [IU] | Freq: Three times a day (TID) | SUBCUTANEOUS | Status: DC
Start: 2015-01-02 — End: 2015-01-03
  Administered 2015-01-02: 3 [IU] via SUBCUTANEOUS
  Administered 2015-01-03: 2 [IU] via SUBCUTANEOUS

## 2015-01-02 NOTE — Progress Notes (Signed)
Hypoglycemic Event  CBG: 57  Treatment: JUICE  Symptoms: N/A  Follow-up CBG: Time: CBG Result:  Possible Reasons for Event:   Comments/MD notified:    Tresa Endo

## 2015-01-02 NOTE — Progress Notes (Signed)
Subjective: No complaints this morning. Denies any further shortness of breath, orthopnea, PND, CP. Denies any increased LE edema. Does not he has been having increased dyspnea with exertion. Only able to walk several feet before becoming winded. Reports continued hallucinations this morning. Reports seeing someone dressed as a physician come in his room this morning and disappear when he turned around. Denies any auditory hallucinations. Reports these symptoms have been ongoing for 3 weeks but chart review documents ongoing issue for 2 months. Symptoms appear to have worsened when started on Seroquel and Abilify. States he could not speak for a day after starting Seroquel. He does report resting tremors. Reports some word finding difficulty but speech is fluent.   Objective: Vital signs in last 24 hours: Filed Vitals:   01/01/15 2159 01/02/15 0022 01/02/15 0458 01/02/15 1310  BP:  138/89 132/61 121/54  Pulse: 70 73 64 64  Temp: 97.5 F (36.4 C)  97.4 F (36.3 C) 98.3 F (36.8 C)  TempSrc: Oral  Oral Oral  Resp: 18 18 18 18   Height: 6\' 1"  (1.854 m)     Weight: 303 lb 3.2 oz (137.531 kg)  303 lb 12.8 oz (137.803 kg)   SpO2: 96% 94% 98% 96%   Weight change:   Intake/Output Summary (Last 24 hours) at 01/02/15 1755 Last data filed at 01/02/15 0858  Gross per 24 hour  Intake    600 ml  Output   1100 ml  Net   -500 ml    Physical Exam GENERAL- alert, co-operative, obese male sitting up in bed not in any distress HEENT- Atraumatic, normocephalic, oral mucosa appears moist CARDIAC- RRR, + systolic murmur, no rubs or gallops. Unable to assess JVD 2/2 body habitus. RESP- Moving equal volumes of air, and clear to auscultation bilaterally, no wheezes or crackles. ABDOMEN- Soft, nontender, bowel sounds present. NEURO- No obvious Cr N abnormality. No cogwheel rigidity though some rigidity in left arm, action tremors bilaterally. No focal weakness.  EXTREMITIES- pulse 2+, symmetric, 1+  pitting edema to knees bilaterally SKIN- Warm, dry, No rash or lesion.  Medications: I have reviewed the patient's current medications. Scheduled Meds: . aspirin EC  81 mg Oral Daily  . atorvastatin  40 mg Oral q1800  . calcium-vitamin D  2 tablet Oral BID  . citalopram  20 mg Oral Daily  . furosemide  60 mg Intravenous BID  . heparin  5,000 Units Subcutaneous 3 times per day  . insulin aspart  0-9 Units Subcutaneous TID WC  . insulin detemir  35 Units Subcutaneous BID  . levothyroxine  200 mcg Oral QAC breakfast  . metoprolol tartrate  12.5 mg Oral BID  . tamsulosin  0.4 mg Oral Daily   Continuous Infusions:  PRN Meds:. Assessment/Plan: HFpEF exacerbation: Reports improvement in his symptoms today. Denies any further SOB. Weight improved from 319 lbs on admission to 303 lb this morning although difficult to ascertain true weight difference given different scales from ED to floor. I/O document only 760 mL net output overnight.  - Continue Lasix 60 mg IV bid - Strict I&O and daily weights  - Lopressor 12.5 mg bid  - Repeat ECHO - BMET in am  Complex Visual Hallucinations: Per chart review, he is having them for 2 months or so. Reports taking seroquel and then felt like he had a stroke. Was not able to talk for ~ a day. PCP changed to Abilify which made him feel "funny" so he did not take it so  now he is only on celexa, as he thinks the abilify caused hallucinations. He reports hallucinations this morning. Symptoms worse at night when he sees people. Does report he has been having difficulty remembering things the past 3 weeks. CT head today with mild intracranial atherosclerosis and mild chronic small vessel ischemic white matter change. Mini Cog today 2/5. 0/3 on word recall but was able to accurately draw a clock with the correct time. Does report some resting tremors (though none seen today but he did have tremors with movement). No cogwheel rigidity. Possible Lewy Body dementia vs  vascular dementia. B12 >2000. TSH 5.3.  -Continue home dose Celexa  Hypothyroidism: TSH 5.2. Free T4 wnl. On Synthroid 200 mcg at home.  - Increase Synthroid to 225 mcg daily though unsure if he will be able to manage this dosing as an outpatient as it would require 1 200 mcg pill and 1 25 mcg pill daily - Recheck TSH in 6 weeks outpatient  Asymptomatic normocytic anemia: Pt with no symptoms of lightheadedness or dizziness. Hemoglobin appears to be chronically low around 10.5 with normal MCV. Ferritin 411.  -Most likely this is ACD- may need to investigate this further in outpatient setting   BPH: -Continue home dose tamsulosin  T2DM: CBG 57 this morning.  Pt with home dose of levemit 90 units daily, glipizide, acarbose, metformin - a1c of 6.5 last month  -Decrease levemir to 35 units qhs -SSI-S -CBG monitoring  HLD -atorvastatin daily 40 mg  DVT PPx:   Dispo: Disposition is deferred at this time, awaiting improvement of current medical problems.    The patient does have a current PCP Claretta Fraise, MD) and does need an [facility] hospital follow-up appointment after discharge.  The patient does not have transportation limitations that hinder transportation to clinic appointments.  .Services Needed at time of discharge: Y = Yes, Blank = No PT:   OT:   RN:   Equipment:   Other:     LOS: 1 day   Maryellen Pile, MD IMTS PGY-1 [phone] 01/02/2015, 5:55 PM

## 2015-01-02 NOTE — Progress Notes (Signed)
Inpatient Diabetes Program Recommendations  AACE/ADA: New Consensus Statement on Inpatient Glycemic Control (2015)  Target Ranges:  Prepandial:   less than 140 mg/dL      Peak postprandial:   less than 180 mg/dL (1-2 hours)      Critically ill patients:  140 - 180 mg/dL  Results for DELOYD, BRUNKE (MRN [id number]) as of 01/02/2015 10:02  Ref. Range 01/01/2015 22:02 01/01/2015 22:40 01/02/2015 06:14 01/02/2015 06:55  Glucose-Capillary Latest Ref Range: 65-99 mg/dL 55 (L) 147 (H) 57 (L) 107 (H)   Review of Glycemic Control  Diabetes history: DM2 Outpatient Diabetes medications: Glipizide 5 mg BID, Metformin 1000 mg BID, Levemir 90 units BID, Acarbose 100 mg BID Current orders for Inpatient glycemic control: Levemir 40 units BID  Inpatient Diabetes Program Recommendations: Insulin - Basal: Noted fasting glucose 57 mg/dl this morning. Hypoglycemia likely due to patient taking his Levemir 90 units and oral DM medications yesterday prior to coming to the hospital and recevied Levemir 40 units last night. Patient is ordered less than half of Levemir dosage taken as an outpatient. Correction (SSI): While inpatient please order CBGs ACHS with Novolog sensitive correction scale.  Thanks, Barnie Alderman, RN, MSN, CDE Diabetes Coordinator Inpatient Diabetes Program [phone] (Team Pager from [place] to [place]) [phone] ([place] office) [phone] [facility] office) [phone] [facility] office)

## 2015-01-03 ENCOUNTER — Inpatient Hospital Stay (HOSPITAL_COMMUNITY): Payer: Medicare Other

## 2015-01-03 DIAGNOSIS — I509 Heart failure, unspecified: Secondary | ICD-10-CM

## 2015-01-03 LAB — CBC
HCT: 32.2 % — ABNORMAL LOW (ref 39.0–52.0)
HEMOGLOBIN: 10.4 g/dL — AB (ref 13.0–17.0)
MCH: 31 pg (ref 26.0–34.0)
MCHC: 32.3 g/dL (ref 30.0–36.0)
MCV: 95.8 fL (ref 78.0–100.0)
Platelets: 170 10*3/uL (ref 150–400)
RBC: 3.36 MIL/uL — AB (ref 4.22–5.81)
RDW: 15.6 % — ABNORMAL HIGH (ref 11.5–15.5)
WBC: 6.6 10*3/uL (ref 4.0–10.5)

## 2015-01-03 LAB — BASIC METABOLIC PANEL
ANION GAP: 8 (ref 5–15)
BUN: 19 mg/dL (ref 6–20)
CALCIUM: 9.3 mg/dL (ref 8.9–10.3)
CO2: 35 mmol/L — ABNORMAL HIGH (ref 22–32)
Chloride: 94 mmol/L — ABNORMAL LOW (ref 101–111)
Creatinine, Ser: 1.2 mg/dL (ref 0.61–1.24)
GFR, EST NON AFRICAN AMERICAN: 59 mL/min — AB (ref >60)
GLUCOSE: 121 mg/dL — AB (ref 65–99)
Potassium: 4.3 mmol/L (ref 3.5–5.1)
SODIUM: 137 mmol/L (ref 135–145)

## 2015-01-03 LAB — PHOSPHORUS: PHOSPHORUS: 4.9 mg/dL — AB (ref 2.5–4.6)

## 2015-01-03 LAB — GLUCOSE, CAPILLARY
GLUCOSE-CAPILLARY: 153 mg/dL — AB (ref 65–99)
Glucose-Capillary: 49 mg/dL — ABNORMAL LOW (ref 65–99)
Glucose-Capillary: 148 mg/dL — ABNORMAL HIGH (ref 65–99)

## 2015-01-03 LAB — MAGNESIUM: MAGNESIUM: 2.2 mg/dL (ref 1.7–2.4)

## 2015-01-03 MED ORDER — SODIUM CHLORIDE 0.9 % IJ SOLN
3.0000 mL | Freq: Two times a day (BID) | INTRAMUSCULAR | Status: DC
  Administered 2015-01-03: 3 mL via INTRAVENOUS

## 2015-01-03 MED ORDER — FUROSEMIDE 40 MG PO TABS
40.0000 mg | ORAL_TABLET | Freq: Two times a day (BID) | ORAL | Status: DC
  Administered 2015-01-03: 40 mg via ORAL
  Filled 2015-01-03: qty 1

## 2015-01-03 MED ORDER — SODIUM CHLORIDE 0.9 % IJ SOLN: 3.0000 mL | INTRAMUSCULAR | Status: DC | PRN

## 2015-01-03 MED ORDER — CITALOPRAM HYDROBROMIDE 20 MG PO TABS: 20.0000 mg | ORAL_TABLET | Freq: Every day | ORAL | Status: DC

## 2015-01-03 MED ORDER — LEVOTHYROXINE SODIUM 50 MCG PO TABS
225.0000 ug | ORAL_TABLET | Freq: Every day | ORAL | Status: DC
  Administered 2015-01-03: 225 ug via ORAL
  Filled 2015-01-03: qty 1

## 2015-01-03 MED ORDER — INSULIN DETEMIR 100 UNIT/ML ~~LOC~~ SOLN: 30.0000 [IU] | Freq: Two times a day (BID) | SUBCUTANEOUS | Status: DC

## 2015-01-03 MED ORDER — PERFLUTREN LIPID MICROSPHERE
1.0000 mL | INTRAVENOUS | Status: AC | PRN
  Filled 2015-01-03: qty 10

## 2015-01-03 MED ORDER — FUROSEMIDE 20 MG PO TABS: 40.0000 mg | ORAL_TABLET | Freq: Two times a day (BID) | ORAL | Status: DC

## 2015-01-03 MED ORDER — LEVOTHYROXINE SODIUM 100 MCG PO TABS
200.0000 ug | ORAL_TABLET | Freq: Every day | ORAL | Status: DC
  Administered 2015-01-03: 200 ug via ORAL
  Filled 2015-01-03: qty 2

## 2015-01-03 MED ORDER — INSULIN DETEMIR 100 UNIT/ML ~~LOC~~ SOLN
30.0000 [IU] | Freq: Two times a day (BID) | SUBCUTANEOUS | Status: DC
  Administered 2015-01-03: 30 [IU] via SUBCUTANEOUS
  Filled 2015-01-03 (×2): qty 0.3

## 2015-01-03 NOTE — Discharge Summary (Signed)
Name: [name] MRN: [id number] DOB: Apr 10, 1945 69 y.o. PCP: Claretta Fraise, MD  Date of Admission: 01/01/2015  4:53 PM Date of Discharge: 01/03/2015 Attending Physician: Gilles Chiquito, MD  Discharge Diagnosis: Active Problems:   Essential hypertension   DM type 2, not at goal [facility])   Dyspnea   Diastolic CHF, acute on chronic (HCC)   CHF exacerbation ([place])   Hallucination  Discharge Medications:   Medication List    TAKE these medications        acarbose 100 MG tablet  Commonly known as:  PRECOSE  TAKE ONE TABLET BY MOUTH TWICE DAILY     aspirin EC 81 MG tablet  Take 81 mg by mouth daily.     calcium-vitamin D 500-200 MG-UNIT tablet  Commonly known as:  OSCAL WITH D  Take 2 tablets by mouth 2 (two) times daily.     citalopram 20 MG tablet  Commonly known as:  CELEXA  Take 1 tablet (20 mg total) by mouth daily.     Fish Oil 1200 MG Caps  Take 1-2 capsules by mouth 2 (two) times daily. 1 capsule by mouth q am, 2 capsules by mouth q pm     furosemide 20 MG tablet  Commonly known as:  LASIX  Take 2 tablets (40 mg total) by mouth 2 (two) times daily.     glipiZIDE 5 MG tablet  Commonly known as:  GLUCOTROL  Take 1 tablet (5 mg total) by mouth 2 (two) times daily before a meal.     glucose blood test strip  Test blood sugar bid. DX E11.9     insulin detemir 100 UNIT/ML injection  Commonly known as:  LEVEMIR  Inject 0.3 mLs (30 Units total) into the skin 2 (two) times daily.     levothyroxine 200 MCG tablet  Commonly known as:  SYNTHROID, LEVOTHROID  Take 1 tablet (200 mcg total) by mouth daily before breakfast.     MENS MULTI VITAMIN & MINERAL PO  Take 1 tablet by mouth daily.     metFORMIN 1000 MG tablet  Commonly known as:  GLUCOPHAGE  Take 1 tablet (1,000 mg total) by mouth 2 (two) times daily with a meal.     metoprolol tartrate 25 MG tablet  Commonly known as:  LOPRESSOR  TAKE ONE-HALF TABLET BY MOUTH TWICE DAILY     simvastatin 80 MG tablet   Commonly known as:  ZOCOR  Take 40 mg by mouth at bedtime.     tamsulosin 0.4 MG Caps capsule  Commonly known as:  FLOMAX  Take 0.4 mg by mouth daily.        Disposition and follow-up:   [name] was discharged from [facility] in Stable condition.  At the hospital follow up visit please address:  1.  HFpEF exacerbation: Weight 303 to 319 on admission down to 300 lb by discharge. He was switched to PO Lasix 40mg  BID.   Complex Visual Hallucinations 2/2 Possible Lewy Body Dementia: Continued home dose Celexa. Would recommend outpatient neurocognitive evaluation.   Hypothyroidism: TSH 5.2. Free T4 wnl. Continue home synthroid 23mcg daily.  T2DM: He had a low CBG to 49. Hgb A1c 6.5% 12/01/2014. His Levemir kept at 30 units BID. He will need to follow up with his PCP for further titration.  2.  Labs / imaging needed at time of follow-up: none  3.  Pending labs/ test needing follow-up: none  Follow-up Appointments:     Follow-up  Information    Follow up with STACKS,WARREN, MD. Schedule an appointment as soon as possible for a visit in 1 week.   Specialty:  Family Medicine   Why:  For hospital follow up   Contact information:   [place]  [id number] [phone]       Discharge Instructions: Discharge Instructions    Call MD for:  difficulty breathing, headache or visual disturbances    Complete by:  As directed      Call MD for:  persistant dizziness or light-headedness    Complete by:  As directed      Call MD for:  persistant nausea and vomiting    Complete by:  As directed      Call MD for:  temperature >100.4    Complete by:  As directed      Diet - low sodium heart healthy    Complete by:  As directed      Increase activity slowly    Complete by:  As directed            Consultations: None  Procedures Performed:  Dg Chest 2 View  01/01/2015  CLINICAL DATA:  Shortness of breath for 4 days EXAM: CHEST - 2 VIEW  COMPARISON:  12/28/2014 FINDINGS: Cardiac shadow is stable. Postoperative changes are again seen. The lungs are well-aerated without focal infiltrate or sizable effusion. Mild vascular prominence is noted centrally. IMPRESSION: Mild vascular congestion.  No acute infiltrate is seen. Electronically Signed   By: Inez Catalina M.D.   On: 01/01/2015 18:04   Dg Chest 2 View  12/28/2014  CLINICAL DATA:  Dyspnea, shortness of breath for 3 weeks with cough. History of diabetes, hypertension, valve replacement, heart murmur. EXAM: CHEST  2 VIEW COMPARISON:  Chest x-rays dated 12/23/2014 and 10/02/2014. FINDINGS: Heart size is upper normal, unchanged. Overall cardiomediastinal silhouette is stable in size and configuration. Median sternotomy wires appear intact and stable in alignment. Mildly prominent interstitial markings are again noted bilaterally, unchanged, presumably chronic. No new lung findings. No evidence of pneumonia. No pleural effusion. No pneumothorax. IMPRESSION: Stable chest x-ray. Chronic mild prominence of the interstitial markings. No evidence of acute cardiopulmonary abnormality. Electronically Signed   By: Franki Cabot M.D.   On: 12/28/2014 14:38   Dg Chest 2 View  12/23/2014  CLINICAL DATA:  Shortness of Breath EXAM: CHEST  2 VIEW COMPARISON:  November 18, 2014 FINDINGS: There is no edema or consolidation. Heart is upper normal in size with pulmonary vascularity within normal limits. No adenopathy. Patient is status post median sternotomy. There are surgical clips in the cervicothoracic junction region midline. There is degenerative change in the thoracic spine. IMPRESSION: No edema or consolidation. Electronically Signed   By: Lowella Grip III M.D.   On: 12/23/2014 14:20   Ct Head Wo Contrast  01/02/2015  CLINICAL DATA:  Inpatient with hallucinations. EXAM: CT HEAD WITHOUT CONTRAST TECHNIQUE: Contiguous axial images were obtained from the base of the skull through the vertex without  intravenous contrast. COMPARISON:  None. FINDINGS: No evidence of parenchymal hemorrhage or extra-axial fluid collection. No mass lesion, mass effect, or midline shift. No CT evidence of acute infarction. Mild intracranial atherosclerosis. Nonspecific subcortical and periventricular white matter hypodensity, most in keeping with chronic small vessel ischemic change. Cerebral volume is age appropriate. No ventriculomegaly. The visualized paranasal sinuses are essentially clear. The mastoid air cells are unopacified. No evidence of calvarial fracture. IMPRESSION: 1.  No evidence of acute intracranial abnormality.  2. Mild intracranial atherosclerosis and mild chronic small vessel ischemic white matter change. Electronically Signed   By: Ilona Sorrel M.D.   On: 01/02/2015 14:09    2D Echo:   Study Conclusions  - Left ventricle: The cavity size was normal. Wall thickness was increased in a pattern of moderate LVH. Systolic function was normal. The estimated ejection fraction was in the range of 60% to 65%. Wall motion was normal; there were no regional wall motion abnormalities. - Aortic valve: There was moderate stenosis.  ------------------------------------------------------------------- Labs, prior tests, procedures, and surgery: Aortic valve replacement.  Transthoracic echocardiography. M-mode, complete 2D, spectral Doppler, and color Doppler. Birthdate: Patient birthdate: 1945-08-14. Age: Patient is 69 yr old. Sex: Gender: male. BMI: 39.6 kg/m^2. Blood pressure:   125/82 Patient status: Inpatient. Study date: Study date: 01/03/2015. Study time: 02:24 PM. Location: Bedside.  Admission HPI:  This is a 69 yo man with HFpEF, aortic valve replacement, T2DM, depression, normocytic anemia, who came in with 3 week history of shortness of breath and fluid retention. He says he normally weighs 280 lbs but has noticed progressive fluid retention and 25-30 lbs weight gain. He  was seen earlier today by a family NP who sent him here who noticed these things.  He says he has dry non productive cough, and he can walk 10-15 feet before getting short of breath but this is not new. The acute HF has not happened before and he has a cardiologist who he saw 6 months back.  He has a stable 2 pillow orthopnea, but says he mostly lays up in a chair even at night. He denies PND.  He lives by himself, and tries to both salt restrict and fluid restrict his diet.  He had an echo done in February which showed EF of 55-50%  He denies fevers, chills, headaches, abd pain, hematochezia, melena, dysuria, hematuria, leg pains. He denies chest pain, or pressure or lightheadedness or dizziness.  In August his cardiologist dr [name] decreased his lasix to 20 mg bid.   His other concern was the hallucinations ongoing for last 2 months or so. He sees different men during darkness but not during daytime. He has not had them today. Per chart review, he is having them for 2 months or so. He says 3 weeks ago, his PCP started him on seroquel and he only took it once, then felt like he had a stroke As he was not able to talk- did not see anyone and then was fine after that day (maybe he had a TIA? As he has a lot of risk factors). Then his PCP put him on Abilify which made him feel "funny" so he did not take it so now he is only on celexa. He said he did not have hallucinations today. Normally he has them twice a day, mainly at night when he sees people.  In the ER, his weight was 319, he was on 2L oxygen stting 97%. Labs were notable for a BNP of 185, and 237 4 days ago. cxr showing mild vascular congestion  Hospital Course by problem list: Active Problems:   Essential hypertension   DM type 2, not at goal [facility])   Dyspnea   Diastolic CHF, acute on chronic (HCC)   CHF exacerbation (HCC)   Hallucination   1. HFpEF exacerbation: On admission, CXR showing mild vascular congestion, and BNP of  around 200. His weight was 317 on 11/29 and 303 on 11/7. 303 appears to be around his  dry weight. He received Lasix 40mg  IV in the ED and was started on Lasix 60mg  IV BID as inpatient. Weight 303 to 319 on admission down to 300 lb by discharge. He was switched to PO Lasix 40mg  BID. Echo LV EF 60-65% with normal wall motion and no regional wall motion abnormalities.  2. Complex Visual Hallucinations 2/2 Possible Lewy Body Dementia: Has tried Seroquel and Abilify as outpatient. CT head with mild intracranial atherosclerosis and mild chronic small vessel ischemic white matter change. Mini Cog 2/5. 0/3 on word recall but was able to accurately draw a clock with the correct time. Continue home dose Celexa. Would recommend outpatient neurocognitive evaluation.   3. Hypothyroidism: TSH 5.2. Free T4 wnl. Continue home synthroid 266mcg daily.  4. T2DM: He had a low CBG to 49. Hgb A1c 6.5% 12/01/2014. His Levemir kept at 30 units BID. He will need to follow up with his PCP for further titration.  Discharge Vitals:   BP 110/55 mmHg  Pulse 66  Temp(Src) 97.4 F (36.3 C) (Oral)  Resp 18  Ht 6\' 1"  (1.854 m)  Wt 300 lb 9.6 oz (136.351 kg)  BMI 39.67 kg/m2  SpO2 96%  Discharge Labs:  Results for orders placed or performed during the hospital encounter of 01/01/15 (from the past 24 hour(s))  Glucose, capillary     Status: Abnormal   Collection Time: 01/02/15  5:07 PM  Result Value Ref Range   Glucose-Capillary 210 (H) 65 - 99 mg/dL  Glucose, capillary     Status: Abnormal   Collection Time: 01/02/15  9:03 PM  Result Value Ref Range   Glucose-Capillary 153 (H) 65 - 99 mg/dL  Basic metabolic panel     Status: Abnormal   Collection Time: 01/03/15  2:40 AM  Result Value Ref Range   Sodium 137 135 - 145 mmol/L   Potassium 4.3 3.5 - 5.1 mmol/L   Chloride 94 (L) 101 - 111 mmol/L   CO2 35 (H) 22 - 32 mmol/L   Glucose, Bld 121 (H) 65 - 99 mg/dL   BUN 19 6 - 20 mg/dL   Creatinine, Ser 1.20 0.61 - 1.24  mg/dL   Calcium 9.3 8.9 - 10.3 mg/dL   GFR calc non Af Amer 59 (L) >60 mL/min   GFR calc Af Amer >60 >60 mL/min   Anion gap 8 5 - 15  CBC     Status: Abnormal   Collection Time: 01/03/15  2:40 AM  Result Value Ref Range   WBC 6.6 4.0 - 10.5 K/uL   RBC 3.36 (L) 4.22 - 5.81 MIL/uL   Hemoglobin 10.4 (L) 13.0 - 17.0 g/dL   HCT 32.2 (L) 39.0 - 52.0 %   MCV 95.8 78.0 - 100.0 fL   MCH 31.0 26.0 - 34.0 pg   MCHC 32.3 30.0 - 36.0 g/dL   RDW 15.6 (H) 11.5 - 15.5 %   Platelets 170 150 - 400 K/uL  Magnesium     Status: None   Collection Time: 01/03/15  2:40 AM  Result Value Ref Range   Magnesium 2.2 1.7 - 2.4 mg/dL  Phosphorus     Status: Abnormal   Collection Time: 01/03/15  2:40 AM  Result Value Ref Range   Phosphorus 4.9 (H) 2.5 - 4.6 mg/dL  Glucose, capillary     Status: Abnormal   Collection Time: 01/03/15  5:21 AM  Result Value Ref Range   Glucose-Capillary 49 (L) 65 - 99 mg/dL  Glucose, capillary  Status: Abnormal   Collection Time: 01/03/15  6:11 AM  Result Value Ref Range   Glucose-Capillary 148 (H) 65 - 99 mg/dL  Glucose, capillary     Status: Abnormal   Collection Time: 01/03/15 11:48 AM  Result Value Ref Range   Glucose-Capillary 153 (H) 65 - 99 mg/dL   Comment 1 Notify RN    Comment 2 Document in Chart     Signed: Milagros Loll, MD 01/03/2015, 4:17 PM    Services Ordered on Discharge: none Equipment Ordered on Discharge: none

## 2015-01-03 NOTE — Progress Notes (Signed)
Patient alert oriented, denies pain, no shortness of breath.Discharge instruction explain and given to the patient, patient verbalized understanding. Iv and tele d/c. Patient d/c home per order.

## 2015-01-03 NOTE — Progress Notes (Signed)
Subjective: Doing well this morning. Reports his dyspnea has resolved. LE edema improved. Denies chest pain.  Objective: Vital signs in last 24 hours: Filed Vitals:   01/02/15 0458 01/02/15 1310 01/02/15 2020 01/03/15 0442  BP: 132/61 121/54 142/62 125/82  Pulse: 64 64 65 61  Temp: 97.4 F (36.3 C) 98.3 F (36.8 C) 97.4 F (36.3 C) 97.3 F (36.3 C)  TempSrc: Oral Oral Oral Oral  Resp: 18 18 18 18   Height:      Weight: 303 lb 12.8 oz (137.803 kg)   300 lb 9.6 oz (136.351 kg)  SpO2: 98% 96% 92% 91%   Weight change: -18 lb 12.8 oz (-8.528 kg)  Intake/Output Summary (Last 24 hours) at 01/03/15 0740 Last data filed at 01/03/15 0100  Gross per 24 hour  Intake    360 ml  Output   1050 ml  Net   -690 ml   General Apperance: NAD HEENT: Normocephalic, atraumatic, PERRL, EOMI, anicteric sclera Neck: Supple, trachea midline Lungs: Clear to auscultation bilaterally. No wheezes, rhonchi or rales. Breathing comfortably Heart: Regular rate and rhythm Abdomen: Soft, nontender, nondistended, no rebound/guarding Extremities: Normal, atraumatic, warm and well perfused, 1+ pitting edema Pulses: 2+ throughout Skin: No rashes or lesions Neurologic: No focal deficits  Lab Results: Basic Metabolic Panel:  Recent Labs Lab 01/02/15 0336 01/03/15 0240  NA 132* 137  K 4.5 4.3  CL 95* 94*  CO2 31 35*  GLUCOSE 130* 121*  BUN 19 19  CREATININE 1.10 1.20  CALCIUM 9.0 9.3  MG  --  2.2  PHOS  --  4.9*   CBC:  Recent Labs Lab 12/28/14 1243 01/01/15 1723 01/03/15 0240  WBC 7.8 8.3 6.6  NEUTROABS 5.8  --   --   HGB 10.9* 10.7* 10.4*  HCT 34.1* 33.3* 32.2*  MCV 97.4 96.0 95.8  PLT 170 167 170   Cardiac Enzymes:  Recent Labs Lab 12/28/14 1243  TROPONINI <0.03   CBG:  Recent Labs Lab 01/02/15 0655 01/02/15 1214 01/02/15 1707 01/02/15 2103 01/03/15 0521 01/03/15 0611  GLUCAP 107* 92 210* 153* 49* 148*   Thyroid Function Tests:  Recent Labs Lab 01/01/15 2314  01/02/15 0336  TSH 5.237*  --   FREET4  --  0.96   Anemia Panel:  Recent Labs Lab 01/02/15 0336  FERRITIN 411*   Urinalysis:  Recent Labs Lab 12/28/14 1408  COLORURINE YELLOW  LABSPEC 1.010  PHURINE 6.0  GLUCOSEU NEGATIVE  HGBUR SMALL*  BILIRUBINUR NEGATIVE  KETONESUR NEGATIVE  PROTEINUR NEGATIVE  NITRITE NEGATIVE  LEUKOCYTESUR MODERATE*    Micro Results: No results found for this or any previous visit (from the past 240 hour(s)).   Studies/Results: Dg Chest 2 View  01/01/2015  CLINICAL DATA:  Shortness of breath for 4 days EXAM: CHEST - 2 VIEW COMPARISON:  12/28/2014 FINDINGS: Cardiac shadow is stable. Postoperative changes are again seen. The lungs are well-aerated without focal infiltrate or sizable effusion. Mild vascular prominence is noted centrally. IMPRESSION: Mild vascular congestion.  No acute infiltrate is seen. Electronically Signed   By: Inez Catalina M.D.   On: 01/01/2015 18:04   Ct Head Wo Contrast  01/02/2015  CLINICAL DATA:  Inpatient with hallucinations. EXAM: CT HEAD WITHOUT CONTRAST TECHNIQUE: Contiguous axial images were obtained from the base of the skull through the vertex without intravenous contrast. COMPARISON:  None. FINDINGS: No evidence of parenchymal hemorrhage or extra-axial fluid collection. No mass lesion, mass effect, or midline shift. No CT evidence of acute  infarction. Mild intracranial atherosclerosis. Nonspecific subcortical and periventricular white matter hypodensity, most in keeping with chronic small vessel ischemic change. Cerebral volume is age appropriate. No ventriculomegaly. The visualized paranasal sinuses are essentially clear. The mastoid air cells are unopacified. No evidence of calvarial fracture. IMPRESSION: 1.  No evidence of acute intracranial abnormality. 2. Mild intracranial atherosclerosis and mild chronic small vessel ischemic white matter change. Electronically Signed   By: Ilona Sorrel M.D.   On: 01/02/2015 14:09    Medications: I have reviewed the patient's current medications. Scheduled Meds: . aspirin EC  81 mg Oral Daily  . atorvastatin  40 mg Oral q1800  . calcium-vitamin D  2 tablet Oral BID  . citalopram  20 mg Oral Daily  . furosemide  60 mg Intravenous BID  . heparin  5,000 Units Subcutaneous 3 times per day  . insulin aspart  0-9 Units Subcutaneous TID WC  . insulin detemir  35 Units Subcutaneous BID  . levothyroxine  225 mcg Oral QAC breakfast  . metoprolol tartrate  12.5 mg Oral BID  . tamsulosin  0.4 mg Oral Daily   Continuous Infusions:  PRN Meds:. Assessment/Plan: 69 year old man with history of diastolic CHF, hypothyroidism, T2DM presenting with dyspnea and increased weight, found to have diastolic CHF exacerbation.  HFpEF exacerbation: His weight was 317 on 11/29 and 303 on 11/7. 303 appears to be around his dry weight. 1.1L UOP and -658ml over last 24 hours. Net neg 1.5L since admission. Weight 303 to 319 on admission now down to 300.  - Switch to PO Lasix 40mg  BID - Strict I&O and daily weights  - Lopressor 12.5 mg bid  - Echo completed.  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 CT head today with mild intracranial atherosclerosis and mild chronic small vessel ischemic white matter change. Mini Cog 2/5. 0/3 on word recall but was able to accurately draw a clock with the correct time.  -Continue home dose Celexa -Would recommend outpatient neurocognitive evaluation  Hypothyroidism: TSH 5.2. Free T4 wnl. On Synthroid 200 mcg at home.  - Continue home synthroid 266mcg daily  Asymptomatic normocytic anemia: Pt with no symptoms of lightheadedness or dizziness. Hemoglobin appears to be chronically low around 10.5 with normal MCV. Ferritin 411. Likely anemia of chronic disease.  BPH: Continue home dose tamsulosin  T2DM: CBG 49 this morning. On levemir 90 units BID, glipizide, acarbose, metformin at home. Hgb A1c  6.5% 12/01/2014.  -Levemir 30 units BID -SSI-S -CBG monitoring  HLD: Continue home atorvastatin daily 40 mg  DVT PPx: subq hep  Dispo: Likely home today.  The patient does have a current PCP Claretta Fraise, MD) and does not need an [facility] hospital follow-up appointment after discharge.  The patient does not have transportation limitations that hinder transportation to clinic appointments.  .Services Needed at time of discharge: Y = Yes, Blank = No PT:   OT:   RN:   Equipment:   Other:     LOS: 2 days   Milagros Loll, MD 01/03/2015, 7:40 AM

## 2015-01-03 NOTE — Discharge Instructions (Signed)
Heart Failure  Heart failure means your heart has trouble pumping blood. This makes it hard for your body to work well. Heart failure is usually a long-term (chronic) condition. You must take good care of yourself and follow your doctor's treatment plan.  HOME CARE   Take your heart medicine as told by your doctor.    Do not stop taking medicine unless your doctor tells you to.    Do not skip any dose of medicine.    Refill your medicines before they run out.    Take other medicines only as told by your doctor or pharmacist.   Stay active if told by your doctor. The elderly and people with severe heart failure should talk with a doctor about physical activity.   Eat heart-healthy foods. Choose foods that are without trans fat and are low in saturated fat, cholesterol, and salt (sodium). This includes fresh or frozen fruits and vegetables, fish, lean meats, fat-free or low-fat dairy foods, whole grains, and high-fiber foods. Lentils and dried peas and beans (legumes) are also good choices.   Limit salt if told by your doctor.   Cook in a healthy way. Roast, grill, broil, bake, poach, steam, or stir-fry foods.   Limit fluids as told by your doctor.   Weigh yourself every morning. Do this after you pee (urinate) and before you eat breakfast. Write down your weight to give to your doctor.   Take your blood pressure and write it down if your doctor tells you to.   Ask your doctor how to check your pulse. Check your pulse as told.   Lose weight if told by your doctor.   Stop smoking or chewing tobacco. Do not use gum or patches that help you quit without your doctor's approval.   Schedule and go to doctor visits as told.   Nonpregnant women should have no more than 1 drink a day. Men should have no more than 2 drinks a day. Talk to your doctor about drinking alcohol.   Stop illegal drug use.   Stay current with shots (immunizations).   Manage your health conditions as told by your doctor.   Learn to  manage your stress.   Rest when you are tired.   If it is really hot outside:    Avoid intense activities.    Use air conditioning or fans, or get in a cooler place.    Avoid caffeine and alcohol.    Wear loose-fitting, lightweight, and light-colored clothing.   If it is really cold outside:    Avoid intense activities.    Layer your clothing.    Wear mittens or gloves, a hat, and a scarf when going outside.    Avoid alcohol.   Learn about heart failure and get support as needed.   Get help to maintain or improve your quality of life and your ability to care for yourself as needed.  GET HELP IF:    You gain weight quickly.   You are more short of breath than usual.   You cannot do your normal activities.   You tire easily.   You cough more than normal, especially with activity.   You have any or more puffiness (swelling) in areas such as your hands, feet, ankles, or belly (abdomen).   You cannot sleep because it is hard to breathe.   You feel like your heart is beating fast (palpitations).   You get dizzy or light-headed when you stand up.  GET HELP   RIGHT AWAY IF:    You have trouble breathing.   There is a change in mental status, such as becoming less alert or not being able to focus.   You have chest pain or discomfort.   You faint.  MAKE SURE YOU:    Understand these instructions.   Will watch your condition.   Will get help right away if you are not doing well or get worse.     This information is not intended to replace advice given to you by your health care provider. Make sure you discuss any questions you have with your health care provider.     Document Released: 10/20/2007 Document Revised: 01/31/2014 Document Reviewed: 02/27/2012  Elsevier Interactive Patient Education 2016 [employer]

## 2015-01-03 NOTE — Progress Notes (Addendum)
  Echocardiogram 2D Echocardiogram with Definity has been performed.  Jennette Dubin 01/03/2015, 3:01 PM

## 2015-01-05 ENCOUNTER — Telehealth: Payer: Self-pay | Admitting: Family Medicine

## 2015-01-05 MED FILL — Perflutren Lipid Microsphere IV Susp 1.1 MG/ML: INTRAVENOUS | Qty: 10 | Status: AC

## 2015-01-05 NOTE — Telephone Encounter (Signed)
Call Completed and Appointment Scheduled: Yes, Date: 01/06/15 with Chevis Pretty, [place] INFORMATION Date of Discharge:01/03/15   Discharge Facility: [facility]   Principal Discharge Diagnosis: CHF  Patient and/or caregiver is knowledgeable of his/her condition(s) and treatment: Yes   MEDICATION RECONCILIATION Medication list reviewed with patient: Yes  Patient is able to obtain needed medications: Yes   ACTIVITIES OF DAILY LIVING  Is the patient able to perform his/her own ADLs: Yes  Patient is receiving home health services: no   PATIENT EDUCATION Questions/Concerns Discussed: No concerns at this time

## 2015-01-06 ENCOUNTER — Ambulatory Visit (INDEPENDENT_AMBULATORY_CARE_PROVIDER_SITE_OTHER): Payer: Medicare Other | Admitting: Nurse Practitioner

## 2015-01-06 ENCOUNTER — Encounter: Payer: Self-pay | Admitting: Nurse Practitioner

## 2015-01-06 VITALS — BP 138/78 | HR 70 | Temp 97.0°F | Ht 73.0 in | Wt 313.0 lb

## 2015-01-06 DIAGNOSIS — R0600 Dyspnea, unspecified: Secondary | ICD-10-CM | POA: Diagnosis not present

## 2015-01-06 DIAGNOSIS — I509 Heart failure, unspecified: Secondary | ICD-10-CM | POA: Diagnosis not present

## 2015-01-06 MED ORDER — METOLAZONE 2.5 MG PO TABS: ORAL_TABLET | ORAL | Status: DC

## 2015-01-06 NOTE — Progress Notes (Signed)
   Subjective:    Patient ID: [name], male    DOB: 1945-12-30, 69 y.o.   MRN: [id number]  HPI Patient came into office on 12/8014 with dyspnea and >20 lb weight gain in 4 days- he was sent to ER via EMS- was admitted for CHF- lost Patient weight today was  317 which is 17 lb increase from when he was discharged from hospital- he weighed here with shoes and clothes on- had him reweigh and it was 313. Which is actually a weight gain of 313 since discharged from hospital. He has some SOB but not as bad as when he was in hospital.   Review of Systems  Constitutional: Negative.   HENT: Negative.   Respiratory: Negative.   Cardiovascular: Negative.   Genitourinary: Negative.   Neurological: Negative.   Psychiatric/Behavioral: Negative.   All other systems reviewed and are negative.      Objective:   Physical Exam  Constitutional: He appears well-developed and well-nourished.  Cardiovascular: Normal rate, regular rhythm and normal heart sounds.   Pulmonary/Chest: Effort normal and breath sounds normal. He has no wheezes. He has no rales.  Dyspnea on exertion  Neurological: He is alert.  Skin: Skin is warm.  Psychiatric: He has a normal mood and affect. His behavior is normal. Judgment and thought content normal.    BP 138/78 mmHg  Pulse 70  Temp(Src) 97 F (36.1 C) (Oral)  Ht 6\' 1"  (1.854 m)  Wt 313 lb (141.976 kg)  BMI 41.30 kg/m2        Assessment & Plan:   1. Dyspnea  2. Congestive heart failure, unspecified congestive heart failure chronicity, unspecified congestive heart failure type ([place]) Meds ordered this encounter  Medications  . metolazone (ZAROXOLYN) 2.5 MG tablet    Sig: 1 po qd x 2days then hold    Dispense:  20 tablet    Refill:  0    Order Specific Question:  Supervising Provider    Answer:  Chipper Herb [[id number]]   patient to see dr. Warren Lacy Friday and will walk over and lets [name] weigh him will decide what to do then hospital records and labs  reviewed [place], FNP

## 2015-01-07 ENCOUNTER — Ambulatory Visit (INDEPENDENT_AMBULATORY_CARE_PROVIDER_SITE_OTHER): Payer: Medicare Other | Admitting: Cardiology

## 2015-01-07 ENCOUNTER — Encounter: Payer: Self-pay | Admitting: Cardiology

## 2015-01-07 VITALS — BP 129/65 | HR 65 | Ht 73.0 in | Wt 313.0 lb

## 2015-01-07 DIAGNOSIS — I359 Nonrheumatic aortic valve disorder, unspecified: Secondary | ICD-10-CM | POA: Diagnosis not present

## 2015-01-07 DIAGNOSIS — I5022 Chronic systolic (congestive) heart failure: Secondary | ICD-10-CM

## 2015-01-07 DIAGNOSIS — I10 Essential (primary) hypertension: Secondary | ICD-10-CM

## 2015-01-07 MED ORDER — METOLAZONE 2.5 MG PO TABS: 2.5000 mg | ORAL_TABLET | Freq: Every day | ORAL | Status: DC

## 2015-01-07 NOTE — Progress Notes (Signed)
HPI The patient presents for followup of aortic valve replacement.  He was discharged from the hospital 4 days ago after being admitted with volume overload. There is no discharge summary done yet but reviewing each the progress notes it looks like he started a weight of 319 pounds and was diuresed to a weight of 300 pounds. It's not clear to me that he is weighing himself at home and his weight yesterday was 313 pounds.  He's not having any acute shortness of breath. He reports that previously his symptoms started and progressed over 3 weeks. He did see his primary provider yesterday who added Zaroxolyn. It's clear that the patient's not entirely understanding what dose of diuretic he supposed to be taking. It appears he was sent home on 40 mg twice a day which should've been twice his previous dose.  He did not bring his medications. He apparently lives by himself in [place]. 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  Neurocognitive testing was suggested as an outpatient. Also during hospitalization he had his dose of Synthroid slightly increased.  The patient has chronic dyspnea. He's clearly volume overloaded. He's not in any distress however. He's not describing new PND. He's had no new palpitations, presyncope or syncope.  Allergies  Allergen Reactions  . Phenothiazines Anaphylaxis  . Pioglitazone Swelling    Current Outpatient Prescriptions  Medication Sig Dispense Refill  . acarbose (PRECOSE) 100 MG tablet TAKE ONE TABLET BY MOUTH TWICE DAILY 180 tablet 0  . aspirin EC 81 MG tablet Take 81 mg by mouth daily.    . calcium-vitamin D (OSCAL WITH D) 500-200 MG-UNIT per tablet Take 2 tablets by mouth 2 (two) times daily. (Patient taking differently: Take 1 tablet by mouth 2 (two) times daily. ) 60 tablet 1  . citalopram (CELEXA) 20 MG tablet Take 1 tablet (20 mg total) by mouth daily. 30 tablet 0  .  furosemide (LASIX) 20 MG tablet Take 2 tablets (40 mg total) by mouth 2 (two) times daily. 30 tablet 4  . glipiZIDE (GLUCOTROL) 5 MG tablet Take 1 tablet (5 mg total) by mouth 2 (two) times daily before a meal. 180 tablet 1  . insulin detemir (LEVEMIR) 100 UNIT/ML injection Inject 0.3 mLs (30 Units total) into the skin 2 (two) times daily. 60 mL 11  . levothyroxine (SYNTHROID, LEVOTHROID) 200 MCG tablet Take 1 tablet (200 mcg total) by mouth daily before breakfast. 90 tablet 3  . metFORMIN (GLUCOPHAGE) 1000 MG tablet Take 1 tablet (1,000 mg total) by mouth 2 (two) times daily with a meal. 180 tablet 3  . metoprolol tartrate (LOPRESSOR) 25 MG tablet TAKE ONE-HALF TABLET BY MOUTH TWICE DAILY 60 tablet 5  . Multiple Vitamins-Minerals (MENS MULTI VITAMIN & MINERAL PO) Take 1 tablet by mouth daily.      . Omega-3 Fatty Acids (FISH OIL) 1200 MG CAPS Take 1-2 capsules by mouth 2 (two) times daily. 1 capsule by mouth q am, 2 capsules by mouth q pm    . simvastatin (ZOCOR) 80 MG tablet Take 40 mg by mouth at bedtime.     . tamsulosin (FLOMAX) 0.4 MG CAPS capsule Take 0.4 mg by mouth daily.    Marland Kitchen glucose blood test strip Test blood sugar bid. DX E11.9 100 each 5  . metolazone (ZAROXOLYN) 2.5 MG tablet Take 1 tablet (2.5 mg total) by mouth daily. As directed 20 tablet 0  Current Facility-Administered Medications  Medication Dose Route Frequency Provider Last Rate Last Dose  . cyanocobalamin ((VITAMIN B-12)) injection 1,000 mcg  1,000 mcg Intramuscular Q30 days Claretta Fraise, MD   1,000 mcg at 12/23/14 1110    Past Medical History  Diagnosis Date  . 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 BPH (benign prostatic hypertrophy) 04/15/2010  . DEPRESSION 01/22/2009  . HYPERCHOLESTEROLEMIA 01/22/2009  . 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 Morbid obesity ([place])   . Heart valve replaced   . Colon polyps   . Ulcer   . Heart murmur   . HYPERTENSION 01/22/2009    dr Casimer Lanius  . Arthritis   . Tremors of nervous system      ?ptsd    Past Surgical History  Procedure Laterality Date  . Appendectomy    . Aortic valve replacement  July 2006    #20 stentless [place] porcine valve  . Dental surgery  05/2004    Dental extractions  . Heel spur surgery Bilateral     resection of heel spur  . Colonoscopy  04/24/2007    [name]: normal  . Tonsillectomy    . Bilateral cataract surg    . Colonoscopy with esophagogastroduodenoscopy (egd) N/A 04/05/2012    Procedure: COLONOSCOPY WITH ESOPHAGOGASTRODUODENOSCOPY (EGD);  Surgeon: Daneil Dolin, MD;  Location: [facility];  Service: Endoscopy;  Laterality: N/A;  10;15  . Thyroidectomy  03/21/2013    DR [name]  . Thyroidectomy N/A 03/21/2013    Procedure: THYROIDECTOMY;  Surgeon: [name], MD;  Location: [facility];  Service: General;  Laterality: N/A;  . Knee arthroscopy with lateral menisectomy Right 04/04/2014    Procedure: KNEE ARTHROSCOPY WITH LATERAL MENISECTOMY;  Surgeon: Carole Civil, MD;  Location: [facility];  Service: Orthopedics;  Laterality: Right;   ROS:   As stated in the HPI and negative for all other systems.  PHYSICAL EXAM BP 129/65 mmHg  Pulse 65  Ht 6\' 1"  (1.854 m)  Wt 313 lb (141.976 kg)  BMI 41.30 kg/m2 GENERAL:  No acute distress NECK:  No jugular venous distention, waveform within normal limits, carotid upstroke brisk and symmetric, no bruits, no thyromegaly LUNGS:  Clear to auscultation bilaterally CHEST:  Well healed sternotomy scar. HEART:  PMI not displaced or sustained,S1 and S2 within normal limits, no S3, no S4 no clicks, no rubs, systolic murmur mid peaking and  radiating into the carotids  ABD:  Flat, positive bowel sounds normal in frequency in pitch, no bruits, no rebound, no guarding, no midline pulsatile mass, no hepatomegaly, no splenomegaly, obese EXT:  2 plus pulses throughout, moderate edema, no cyanosis no clubbing NEURO:  Nonfocal    ASSESSMENT AND PLAN  ACUTE ON CHRONIC SYSTOLIC AND DIASTOLIC HF:   The patient still  is volume overloaded. I can't tell because the discharge summary.  I'm going to have him continue to take his Zaroxolyn 2.5 mg daily. He didn't need to come back with his medications to be seen by his primary care provider and have a basic metabolic profile on Friday. He should be taking 40 mg of Lasix twice daily. We'll going to look into getting some type of home health heart failure evaluation.  AORTIC VALVE REPLACEMENT, HX OF - I did review his echo results from this hospitalization. 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 No change in therapy is indicated.  HYPERTENSION -  His blood pressure is currently controlled. He will continue the meds as listed.  CAROTID OCCLUSIVE DISEASE -  He has less  than 50% plaque on the right.  I will schedule him for follow up when I see him next.     Hospital charts reviewed.

## 2015-01-07 NOTE — Patient Instructions (Addendum)
Medication Instructions:  Please take Furosemide 40 mg twice a day. Take Zaroxolyn 2.5 mg everyday until you see Shelah Lewandowsky to get further instruction. Continue all other medications as listed. MAKE SURE TO BRING ALL MEDICATIONS WITH YOU TO YOUR APPOINTMENTS.  Labwork: Please have blood work on Friday at [facility])  Follow-Up: See Mary-Margaret Hassell Done on Friday. Follow up with Dr [name] in January 2017 as scheduled.  If you need a refill on your cardiac medications before your next appointment, please call your pharmacy.  Thank you for choosing [facility]!!

## 2015-01-09 ENCOUNTER — Encounter: Payer: Self-pay | Admitting: Nurse Practitioner

## 2015-01-09 ENCOUNTER — Ambulatory Visit (INDEPENDENT_AMBULATORY_CARE_PROVIDER_SITE_OTHER): Payer: Medicare Other | Admitting: Nurse Practitioner

## 2015-01-09 VITALS — BP 128/66 | HR 78 | Temp 97.2°F | Ht 73.0 in | Wt 302.0 lb

## 2015-01-09 DIAGNOSIS — I509 Heart failure, unspecified: Secondary | ICD-10-CM | POA: Diagnosis not present

## 2015-01-09 NOTE — Progress Notes (Signed)
   Subjective:    Patient ID: [name], male    DOB: Aug 14, 1945, 69 y.o.   MRN: [id number]  HPI Patient is in today for CHF recheck- he saw dr. Warren Lacy Wednesday and at that time his weight was down 2lbs from visit on Monday- He was given zaroxlyn to take only 2 days. He says that he has been voiding a lot.    Review of Systems  Constitutional: Negative.   HENT: Negative.   Respiratory: Positive for shortness of breath (some).   Cardiovascular: Negative.   Genitourinary: Negative.   Neurological: Negative.   Psychiatric/Behavioral: Negative.   All other systems reviewed and are negative.      Objective:   Physical Exam  Constitutional: He appears well-developed and well-nourished.  Cardiovascular: Normal rate, regular rhythm and normal heart sounds.   Pulmonary/Chest: Effort normal and breath sounds normal. He has no rales.  Neurological: He is alert.  Skin: Skin is warm and dry.  Psychiatric: He has a normal mood and affect. His behavior is normal. Judgment and thought content normal.    BP 128/66 mmHg  Pulse 78  Temp(Src) 97.2 F (36.2 C) (Oral)  Ht '6\' 1"'$  (1.854 m)  Wt 302 lb (136.986 kg)  BMI 39.85 kg/m2  Weight down 11lbs since Wednesday      Assessment & Plan:  1. Congestive heart failure, unspecified congestive heart failure chronicity, unspecified congestive heart failure type ([place]) Daily weights If weight shoots up and become SOB then take 1 zaroxlyn and call and let me know. - BMP8+EGFR - Brain natriuretic peptide  Mary-Margaret Hassell Done, FNP

## 2015-01-09 NOTE — Patient Instructions (Signed)

## 2015-01-10 LAB — BMP8+EGFR
BUN / CREAT RATIO: 20 (ref 10–22)
BUN: 24 mg/dL (ref 8–27)
CO2: 29 mmol/L (ref 18–29)
CREATININE: 1.19 mg/dL (ref 0.76–1.27)
Calcium: 9.4 mg/dL (ref 8.6–10.2)
Chloride: 87 mmol/L — ABNORMAL LOW (ref 96–106)
GFR calc Af Amer: 71 mL/min/{1.73_m2} (ref >59)
GFR, EST NON AFRICAN AMERICAN: 61 mL/min/{1.73_m2} (ref >59)
GLUCOSE: 99 mg/dL (ref 65–99)
Potassium: 4.6 mmol/L (ref 3.5–5.2)
SODIUM: 132 mmol/L — AB (ref 134–144)

## 2015-01-10 LAB — BRAIN NATRIURETIC PEPTIDE: BNP: 94.8 pg/mL (ref 0.0–100.0)

## 2015-01-13 ENCOUNTER — Ambulatory Visit (INDEPENDENT_AMBULATORY_CARE_PROVIDER_SITE_OTHER): Payer: Medicare Other | Admitting: Pediatrics

## 2015-01-13 VITALS — BP 117/66 | HR 76 | Temp 97.0°F | Ht 73.0 in | Wt 294.6 lb

## 2015-01-13 DIAGNOSIS — R3989 Other symptoms and signs involving the genitourinary system: Secondary | ICD-10-CM

## 2015-01-13 DIAGNOSIS — L03311 Cellulitis of abdominal wall: Secondary | ICD-10-CM | POA: Diagnosis not present

## 2015-01-13 DIAGNOSIS — B372 Candidiasis of skin and nail: Secondary | ICD-10-CM

## 2015-01-13 MED ORDER — NYSTATIN 100000 UNIT/GM EX POWD: CUTANEOUS | Status: DC

## 2015-01-13 MED ORDER — FLUCONAZOLE 100 MG PO TABS: 100.0000 mg | ORAL_TABLET | Freq: Once | ORAL | Status: DC

## 2015-01-13 MED ORDER — DOXYCYCLINE HYCLATE 100 MG PO TABS: 100.0000 mg | ORAL_TABLET | Freq: Two times a day (BID) | ORAL | Status: DC

## 2015-01-13 NOTE — Patient Instructions (Addendum)
Fluconazole once a day for 14 days for skin yeast infection  Use powder in affected area to help with drying  Keep groin as dry as possible, change immediately with any incontinence  Bring back urine sample   Take doxycycline twice a day for 10 days for skin infection  Return to clinic for any worsening   Come back in 7 days for re-evaluation

## 2015-01-13 NOTE — Progress Notes (Signed)
Subjective:    Patient ID: [name], male    DOB: [birth year], 69 y.o.   MRN: [id number]  CC: Urine Discoloration and Rash   HPI: [name] is a 69 y.o. male presenting for Urine Discoloration and Rash  3-4 days ago started having redness around groin Now with weeping areas Burning some with urination sometimes, not every time Goes to bathroom 4 times a night, usual for him Having some urinary frequency, doesn't seem any worse than usual per pt Has had some incontinence for the past few months, not something new. Is in depends-type undergarments most of the time No fevers Swelling much improved since recent hospitalization  Depression screen [facility] 2/9 01/13/2015 01/06/2015 12/01/2014 11/08/2014 08/06/2014  Decreased Interest 0 0 0 0 0  Down, Depressed, Hopeless 0 0 0 0 0  PHQ - 2 Score 0 0 0 0 0  Altered sleeping - - - - -  Tired, decreased energy - - - - -  Change in appetite - - - - -  Feeling bad or failure about yourself  - - - - -  Trouble concentrating - - - - -  Moving slowly or fidgety/restless - - - - -  Suicidal thoughts - - - - -  PHQ-9 Score - - - - -     Relevant past medical, surgical, family and social history reviewed and updated as indicated. Interim medical history since our last visit reviewed. Allergies and medications reviewed and updated.    ROS: Per HPI unless specifically indicated above  History  Smoking status  . Former Smoker -- 0.50 packs/day for 12 years  . Types: Cigarettes  . Quit date: 09/20/1961  Smokeless tobacco  . Never Used    Past Medical History Patient Active Problem List   Diagnosis Date Noted  . Hallucination   . Diastolic CHF, acute on chronic (HCC) 01/01/2015  . CHF exacerbation ([place]) 01/01/2015  . Dyspnea 12/23/2014  . B12 deficiency 04/08/2014  . Lateral meniscal tear   . DM type 2, not at goal [facility]) 03/12/2014  . 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 GERD (gastroesophageal reflux disease)  11/02/2012  . Anemia 04/02/2012  . Obesity 08/04/2010  . BPH (benign prostatic hypertrophy) 04/15/2010  . CAROTID OCCLUSIVE DISEASE 01/26/2010  . MURMUR 05/27/2009  . Aortic valve disease 05/27/2009  . Hyperlipidemia with target LDL less than 100 01/22/2009  . Depression 01/22/2009  . Essential hypertension 01/22/2009        Objective:    BP 117/66 mmHg  Pulse 76  Temp(Src) 97 F (36.1 C) (Oral)  Ht 6\' 1"  (1.854 m)  Wt 294 lb 9.6 oz (133.63 kg)  BMI 38.88 kg/m2  Wt Readings from Last 3 Encounters:  01/13/15 294 lb 9.6 oz (133.63 kg)  01/09/15 302 lb (136.986 kg)  01/07/15 313 lb (141.976 kg)     Gen: NAD, alert, cooperative with exam, NCAT EYES: EOMI, no scleral injection or icterus ENT:  TMs pearly gray b/l, OP without erythema LYMPH: no cervical LAD CV: NRRR, normal [id number], II/VI systolic ejection murmur, distal pulses 2+ b/l Resp: CTABL, no wheezes, normal WOB Abd: +BS, soft, NTND. no guarding or organomegaly Ext: No edema, warm Neuro: Alert and oriented, strength equal b/l UE and LE, coordination grossly normal MSK: normal muscle bulk Skin: weeping red areas in b/l inguinal folds, no tenderness with palpation, no induration in groin. Redness over scrotum, no swelling or tenderness. Abd wall with some redness and induration in areas  he places SQ insulin    Assessment & Plan:    Braylenn was seen today for urine discoloration and rash, found to have intertrigal candidia, some induration abd wall, concerned for secondary infection, will start diflucan and doxycycline as below. Pt not able to leave urine sample today after waiting for over an hour. Gave him sample cup to get urine sample at home and bring back.  Diagnoses and all orders for this visit:  Intertriginous candidiasis -     fluconazole (DIFLUCAN) 100 MG tablet; Take 1 tablet (100 mg total) by mouth once. -     nystatin (MYCOSTATIN/NYSTOP) 100000 UNIT/GM POWD; Apply to affected  Urine discoloration -      POCT urinalysis dipstick -     POCT UA - Microscopic Only  Cellulitis of abdominal wall -     doxycycline (VIBRA-TABS) 100 MG tablet; Take 1 tablet (100 mg total) by mouth 2 (two) times daily.   Patient Instructions Fluconazole once a day for 14 days for skin yeast infection  Use powder in affected area to help with drying  Keep groin as dry as possible, change immediately with any incontinence  Bring back urine sample   Take doxycycline twice a day for 10 days for skin infection  Return to clinic for any worsening   Come back in 7 days for re-evaluation    Follow up plan: Return in about 7 days (around 01/20/2015).  Assunta Found, MD [facility] 01/13/2015, 4:12 PM

## 2015-01-15 DIAGNOSIS — R21 Rash and other nonspecific skin eruption: Secondary | ICD-10-CM | POA: Diagnosis not present

## 2015-01-15 DIAGNOSIS — Z952 Presence of prosthetic heart valve: Secondary | ICD-10-CM | POA: Diagnosis not present

## 2015-01-15 DIAGNOSIS — Z888 Allergy status to other drugs, medicaments and biological substances status: Secondary | ICD-10-CM | POA: Diagnosis not present

## 2015-01-15 DIAGNOSIS — Z794 Long term (current) use of insulin: Secondary | ICD-10-CM | POA: Diagnosis not present

## 2015-01-15 DIAGNOSIS — N138 Other obstructive and reflux uropathy: Secondary | ICD-10-CM | POA: Diagnosis not present

## 2015-01-15 DIAGNOSIS — E039 Hypothyroidism, unspecified: Secondary | ICD-10-CM | POA: Diagnosis present

## 2015-01-15 DIAGNOSIS — N5089 Other specified disorders of the male genital organs: Secondary | ICD-10-CM | POA: Diagnosis not present

## 2015-01-15 DIAGNOSIS — I110 Hypertensive heart disease with heart failure: Secondary | ICD-10-CM | POA: Diagnosis not present

## 2015-01-15 DIAGNOSIS — N200 Calculus of kidney: Secondary | ICD-10-CM | POA: Diagnosis not present

## 2015-01-15 DIAGNOSIS — R208 Other disturbances of skin sensation: Secondary | ICD-10-CM | POA: Diagnosis not present

## 2015-01-15 DIAGNOSIS — N401 Enlarged prostate with lower urinary tract symptoms: Secondary | ICD-10-CM | POA: Diagnosis present

## 2015-01-15 DIAGNOSIS — L089 Local infection of the skin and subcutaneous tissue, unspecified: Secondary | ICD-10-CM | POA: Diagnosis not present

## 2015-01-15 DIAGNOSIS — A483 Toxic shock syndrome: Secondary | ICD-10-CM | POA: Diagnosis not present

## 2015-01-15 DIAGNOSIS — E118 Type 2 diabetes mellitus with unspecified complications: Secondary | ICD-10-CM | POA: Diagnosis not present

## 2015-01-15 DIAGNOSIS — N359 Urethral stricture, unspecified: Secondary | ICD-10-CM | POA: Diagnosis not present

## 2015-01-15 DIAGNOSIS — E1165 Type 2 diabetes mellitus with hyperglycemia: Secondary | ICD-10-CM | POA: Diagnosis not present

## 2015-01-15 DIAGNOSIS — I509 Heart failure, unspecified: Secondary | ICD-10-CM | POA: Diagnosis not present

## 2015-01-15 DIAGNOSIS — E038 Other specified hypothyroidism: Secondary | ICD-10-CM | POA: Diagnosis not present

## 2015-01-15 DIAGNOSIS — N35919 Unspecified urethral stricture, male, unspecified site: Secondary | ICD-10-CM | POA: Insufficient documentation

## 2015-01-15 DIAGNOSIS — L538 Other specified erythematous conditions: Secondary | ICD-10-CM | POA: Diagnosis not present

## 2015-01-15 DIAGNOSIS — Z7409 Other reduced mobility: Secondary | ICD-10-CM | POA: Diagnosis not present

## 2015-01-15 DIAGNOSIS — E785 Hyperlipidemia, unspecified: Secondary | ICD-10-CM | POA: Diagnosis not present

## 2015-01-15 DIAGNOSIS — L03314 Cellulitis of groin: Secondary | ICD-10-CM | POA: Diagnosis not present

## 2015-01-15 DIAGNOSIS — J449 Chronic obstructive pulmonary disease, unspecified: Secondary | ICD-10-CM | POA: Diagnosis present

## 2015-01-15 DIAGNOSIS — T7840XA Allergy, unspecified, initial encounter: Secondary | ICD-10-CM | POA: Diagnosis not present

## 2015-01-15 DIAGNOSIS — N471 Phimosis: Secondary | ICD-10-CM | POA: Insufficient documentation

## 2015-01-15 DIAGNOSIS — N400 Enlarged prostate without lower urinary tract symptoms: Secondary | ICD-10-CM | POA: Diagnosis not present

## 2015-01-15 DIAGNOSIS — M6281 Muscle weakness (generalized): Secondary | ICD-10-CM | POA: Diagnosis not present

## 2015-01-15 DIAGNOSIS — L0390 Cellulitis, unspecified: Secondary | ICD-10-CM | POA: Diagnosis not present

## 2015-01-15 DIAGNOSIS — Z7984 Long term (current) use of oral hypoglycemic drugs: Secondary | ICD-10-CM | POA: Diagnosis not present

## 2015-01-15 DIAGNOSIS — B950 Streptococcus, group A, as the cause of diseases classified elsewhere: Secondary | ICD-10-CM | POA: Diagnosis not present

## 2015-01-15 DIAGNOSIS — N4883 Acquired buried penis: Secondary | ICD-10-CM | POA: Diagnosis not present

## 2015-01-15 DIAGNOSIS — R609 Edema, unspecified: Secondary | ICD-10-CM | POA: Diagnosis not present

## 2015-01-15 DIAGNOSIS — B356 Tinea cruris: Secondary | ICD-10-CM | POA: Diagnosis not present

## 2015-01-15 DIAGNOSIS — Q5564 Hidden penis: Secondary | ICD-10-CM | POA: Diagnosis not present

## 2015-01-15 DIAGNOSIS — R262 Difficulty in walking, not elsewhere classified: Secondary | ICD-10-CM | POA: Diagnosis not present

## 2015-01-15 DIAGNOSIS — R279 Unspecified lack of coordination: Secondary | ICD-10-CM | POA: Diagnosis not present

## 2015-01-15 DIAGNOSIS — T887XXA Unspecified adverse effect of drug or medicament, initial encounter: Secondary | ICD-10-CM | POA: Diagnosis not present

## 2015-01-15 DIAGNOSIS — T3695XA Adverse effect of unspecified systemic antibiotic, initial encounter: Secondary | ICD-10-CM | POA: Diagnosis present

## 2015-01-15 DIAGNOSIS — Z79899 Other long term (current) drug therapy: Secondary | ICD-10-CM | POA: Diagnosis not present

## 2015-01-15 DIAGNOSIS — I10 Essential (primary) hypertension: Secondary | ICD-10-CM | POA: Diagnosis not present

## 2015-01-15 DIAGNOSIS — E119 Type 2 diabetes mellitus without complications: Secondary | ICD-10-CM | POA: Diagnosis not present

## 2015-01-15 DIAGNOSIS — A491 Streptococcal infection, unspecified site: Secondary | ICD-10-CM | POA: Diagnosis not present

## 2015-01-20 DIAGNOSIS — Q5564 Hidden penis: Secondary | ICD-10-CM | POA: Diagnosis not present

## 2015-01-20 DIAGNOSIS — E119 Type 2 diabetes mellitus without complications: Secondary | ICD-10-CM | POA: Diagnosis not present

## 2015-01-20 DIAGNOSIS — T887XXA Unspecified adverse effect of drug or medicament, initial encounter: Secondary | ICD-10-CM | POA: Diagnosis not present

## 2015-01-20 DIAGNOSIS — E785 Hyperlipidemia, unspecified: Secondary | ICD-10-CM | POA: Diagnosis not present

## 2015-01-20 DIAGNOSIS — R609 Edema, unspecified: Secondary | ICD-10-CM | POA: Diagnosis not present

## 2015-01-20 DIAGNOSIS — B950 Streptococcus, group A, as the cause of diseases classified elsewhere: Secondary | ICD-10-CM | POA: Diagnosis not present

## 2015-01-20 DIAGNOSIS — I509 Heart failure, unspecified: Secondary | ICD-10-CM | POA: Diagnosis not present

## 2015-01-20 DIAGNOSIS — N359 Urethral stricture, unspecified: Secondary | ICD-10-CM | POA: Diagnosis not present

## 2015-01-20 DIAGNOSIS — R279 Unspecified lack of coordination: Secondary | ICD-10-CM | POA: Diagnosis not present

## 2015-01-20 DIAGNOSIS — N400 Enlarged prostate without lower urinary tract symptoms: Secondary | ICD-10-CM | POA: Diagnosis not present

## 2015-01-20 DIAGNOSIS — L03818 Cellulitis of other sites: Secondary | ICD-10-CM | POA: Diagnosis not present

## 2015-01-20 DIAGNOSIS — K5229 Other allergic and dietetic gastroenteritis and colitis: Secondary | ICD-10-CM | POA: Diagnosis not present

## 2015-01-20 DIAGNOSIS — J441 Chronic obstructive pulmonary disease with (acute) exacerbation: Secondary | ICD-10-CM | POA: Diagnosis not present

## 2015-01-20 DIAGNOSIS — A491 Streptococcal infection, unspecified site: Secondary | ICD-10-CM | POA: Diagnosis not present

## 2015-01-20 DIAGNOSIS — I10 Essential (primary) hypertension: Secondary | ICD-10-CM | POA: Diagnosis not present

## 2015-01-20 DIAGNOSIS — E118 Type 2 diabetes mellitus with unspecified complications: Secondary | ICD-10-CM | POA: Diagnosis not present

## 2015-01-20 DIAGNOSIS — Z7409 Other reduced mobility: Secondary | ICD-10-CM | POA: Diagnosis not present

## 2015-01-20 DIAGNOSIS — L538 Other specified erythematous conditions: Secondary | ICD-10-CM | POA: Diagnosis not present

## 2015-01-20 DIAGNOSIS — L03314 Cellulitis of groin: Secondary | ICD-10-CM | POA: Diagnosis not present

## 2015-01-20 DIAGNOSIS — E039 Hypothyroidism, unspecified: Secondary | ICD-10-CM | POA: Diagnosis not present

## 2015-01-20 DIAGNOSIS — J449 Chronic obstructive pulmonary disease, unspecified: Secondary | ICD-10-CM | POA: Diagnosis not present

## 2015-01-20 DIAGNOSIS — R262 Difficulty in walking, not elsewhere classified: Secondary | ICD-10-CM | POA: Diagnosis not present

## 2015-01-20 DIAGNOSIS — M6281 Muscle weakness (generalized): Secondary | ICD-10-CM | POA: Diagnosis not present

## 2015-01-21 DIAGNOSIS — K5229 Other allergic and dietetic gastroenteritis and colitis: Secondary | ICD-10-CM | POA: Diagnosis not present

## 2015-01-21 DIAGNOSIS — J441 Chronic obstructive pulmonary disease with (acute) exacerbation: Secondary | ICD-10-CM | POA: Diagnosis not present

## 2015-01-21 DIAGNOSIS — E119 Type 2 diabetes mellitus without complications: Secondary | ICD-10-CM | POA: Diagnosis not present

## 2015-01-21 DIAGNOSIS — L03818 Cellulitis of other sites: Secondary | ICD-10-CM | POA: Diagnosis not present

## 2015-02-03 ENCOUNTER — Encounter: Payer: Self-pay | Admitting: Nurse Practitioner

## 2015-02-03 ENCOUNTER — Ambulatory Visit (INDEPENDENT_AMBULATORY_CARE_PROVIDER_SITE_OTHER): Payer: Medicare Other | Admitting: Nurse Practitioner

## 2015-02-03 VITALS — BP 105/62 | HR 81 | Temp 96.8°F | Ht 73.0 in | Wt 290.0 lb

## 2015-02-03 DIAGNOSIS — L03115 Cellulitis of right lower limb: Secondary | ICD-10-CM | POA: Diagnosis not present

## 2015-02-03 MED ORDER — CIPROFLOXACIN HCL 500 MG PO TABS: 500.0000 mg | ORAL_TABLET | Freq: Two times a day (BID) | ORAL | Status: DC

## 2015-02-03 NOTE — Progress Notes (Signed)
   Subjective:    Patient ID: [name], male    DOB: 02/20/1945, 70 y.o.   MRN: [id number]  HPI Patient in today for hospital follow up. He was in hospital at [facility]- He was given doxycycline for cellulitis and diflucan for yeast- Roosvelt Maser told him he had bad cellulitis of groin area.- he had rash and and skin started peeling in groin area. He was in hospital for 3 days [facility]. 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  * He had a blister on right lower leg in which the nurse at [facility] center popped and dressed- it is now red and sore with some drainage.    Review of Systems  Constitutional: Negative.   HENT: Negative.   Respiratory: Negative.   Cardiovascular: Negative.   Gastrointestinal: Negative.   Genitourinary: Negative.   Psychiatric/Behavioral: Negative.   All other systems reviewed and are negative.      Objective:   Physical Exam  Constitutional: He is oriented to person, place, and time. He appears well-developed and well-nourished.  Cardiovascular: Normal rate, regular rhythm and normal heart sounds.   Pulmonary/Chest: Effort normal and breath sounds normal.  Neurological: He is alert and oriented to person, place, and time.  Skin: Skin is warm.  Erythematous 6cm annular lesion with surrounding erythema and heat to touch on right  Lower leg. Moist appearing.   BP 105/62 mmHg  Pulse 81  Temp(Src) 96.8 F (36 C) (Oral)  Ht 6\' 1"  (1.854 m)  Wt 290 lb (131.543 kg)  BMI 38.27 kg/m2        Assessment & Plan:  1. Cellulitis of right lower extremity Clean with antibacterial soap daily Keep covered clean and dry Meds ordered this encounter  Medications  . ciprofloxacin (CIPRO) 500 MG tablet    Sig: Take 1 tablet (500 mg total) by mouth 2 (two) times daily.    Dispense:  20 tablet    Refill:  0    Order Specific Question:  Supervising Provider      Answer:  Chipper Herb [[id number]]   [place], FNP

## 2015-02-03 NOTE — Patient Instructions (Signed)

## 2015-02-05 ENCOUNTER — Encounter: Payer: Self-pay | Admitting: Nurse Practitioner

## 2015-02-05 ENCOUNTER — Ambulatory Visit: Payer: Medicare Other | Admitting: Pharmacist

## 2015-02-05 ENCOUNTER — Ambulatory Visit (INDEPENDENT_AMBULATORY_CARE_PROVIDER_SITE_OTHER): Payer: Medicare Other | Admitting: Nurse Practitioner

## 2015-02-05 VITALS — BP 138/72 | HR 78 | Temp 98.2°F | Ht 73.0 in | Wt 292.0 lb

## 2015-02-05 DIAGNOSIS — L03115 Cellulitis of right lower limb: Secondary | ICD-10-CM

## 2015-02-05 NOTE — Progress Notes (Signed)
   Subjective:    Patient ID: [name], male    DOB: 07-26-1945, 70 y.o.   MRN: [id number]  HPI Patient was seen 2 days ago with cellulitic lesion on right lower leg- we cleaned wound and dressed it and started him on antibiotic- he comes back today for recheck and to determine if we need to send hi to wound care. He has not gotten cipro rx filled that was sent in 2 days ago.    Review of Systems  Constitutional: Negative.   HENT: Negative.   Respiratory: Negative.   Cardiovascular: Negative.   Genitourinary: Negative.   Neurological: Negative.   Psychiatric/Behavioral: Negative.   All other systems reviewed and are negative.      Objective:   Physical Exam  Constitutional: He appears well-developed and well-nourished.  Cardiovascular: Normal rate and normal heart sounds.   Pulmonary/Chest: Effort normal and breath sounds normal.  Skin: Skin is warm.  Wound actually has more surrounding erythema and is moist appearing. Slight yellowish excudate from central area of lesion.    BP 138/72 mmHg  Pulse 78  Temp(Src) 98.2 F (36.8 C) (Oral)  Ht 6\' 1"  (1.854 m)  Wt 292 lb (132.45 kg)  BMI 38.53 kg/m2       Assessment & Plan:  1. Cellulitis of right lower extremity Wound care will be discussed at wound care appointment - AMB referral to wound care center  [place], FNP

## 2015-02-05 NOTE — Patient Instructions (Signed)
Wound Care °Taking care of your wound properly can help to prevent pain and infection. It can also help your wound to heal more quickly.  °HOW TO CARE FOR YOUR WOUND  °· Take or apply over-the-counter and prescription medicines only as told by your health care provider. °· If you were prescribed antibiotic medicine, take or apply it as told by your health care provider. Do not stop using the antibiotic even if your condition improves. °· Clean the wound each day or as told by your health care provider. °¨ Wash the wound with mild soap and water. °¨ Rinse the wound with water to remove all soap. °¨ Pat the wound dry with a clean towel. Do not rub it. °· There are many different ways to close and cover a wound. For example, a wound can be covered with stitches (sutures), skin glue, or adhesive strips. Follow instructions from your health care provider about: °¨ How to take care of your wound. °¨ When and how you should change your bandage (dressing). °¨ When you should remove your dressing. °¨ Removing whatever was used to close your wound. °· Check your wound every day for signs of infection. Watch for: °¨ Redness, swelling, or pain. °¨ Fluid, blood, or pus. °· Keep the dressing dry until your health care provider says it can be removed. Do not take baths, swim, use a hot tub, or do anything that would put your wound underwater until your health care provider approves. °· Raise (elevate) the injured area above the level of your heart while you are sitting or lying down. °· Do not scratch or pick at the wound. °· Keep all follow-up visits as told by your health care provider. This is important. °SEEK MEDICAL CARE IF: °· You received a tetanus shot and you have swelling, severe pain, redness, or bleeding at the injection site. °· You have a fever. °· Your pain is not controlled with medicine. °· You have increased redness, swelling, or pain at the site of your wound. °· You have fluid, blood, or pus coming from your  wound. °· You notice a bad smell coming from your wound or your dressing. °SEEK IMMEDIATE MEDICAL CARE IF: °· You have a red streak going away from your wound. °  °This information is not intended to replace advice given to you by your health care provider. Make sure you discuss any questions you have with your health care provider. °  °Document Released: 10/20/2007 Document Revised: 05/27/2014 Document Reviewed: 01/06/2014 °Elsevier Interactive Patient Education ©2016 Elsevier Inc. ° °

## 2015-02-06 DIAGNOSIS — Z794 Long term (current) use of insulin: Secondary | ICD-10-CM | POA: Diagnosis not present

## 2015-02-06 DIAGNOSIS — L03115 Cellulitis of right lower limb: Secondary | ICD-10-CM | POA: Diagnosis not present

## 2015-02-06 DIAGNOSIS — E119 Type 2 diabetes mellitus without complications: Secondary | ICD-10-CM | POA: Diagnosis not present

## 2015-02-06 DIAGNOSIS — E039 Hypothyroidism, unspecified: Secondary | ICD-10-CM | POA: Diagnosis not present

## 2015-02-06 DIAGNOSIS — I110 Hypertensive heart disease with heart failure: Secondary | ICD-10-CM | POA: Diagnosis not present

## 2015-02-06 DIAGNOSIS — Z7984 Long term (current) use of oral hypoglycemic drugs: Secondary | ICD-10-CM | POA: Diagnosis not present

## 2015-02-06 DIAGNOSIS — I509 Heart failure, unspecified: Secondary | ICD-10-CM | POA: Diagnosis not present

## 2015-02-06 DIAGNOSIS — J449 Chronic obstructive pulmonary disease, unspecified: Secondary | ICD-10-CM | POA: Diagnosis not present

## 2015-02-09 DIAGNOSIS — E119 Type 2 diabetes mellitus without complications: Secondary | ICD-10-CM | POA: Diagnosis not present

## 2015-02-09 DIAGNOSIS — E039 Hypothyroidism, unspecified: Secondary | ICD-10-CM | POA: Diagnosis not present

## 2015-02-09 DIAGNOSIS — J449 Chronic obstructive pulmonary disease, unspecified: Secondary | ICD-10-CM | POA: Diagnosis not present

## 2015-02-09 DIAGNOSIS — I509 Heart failure, unspecified: Secondary | ICD-10-CM | POA: Diagnosis not present

## 2015-02-09 DIAGNOSIS — L03115 Cellulitis of right lower limb: Secondary | ICD-10-CM | POA: Diagnosis not present

## 2015-02-09 DIAGNOSIS — I110 Hypertensive heart disease with heart failure: Secondary | ICD-10-CM | POA: Diagnosis not present

## 2015-02-10 DIAGNOSIS — I110 Hypertensive heart disease with heart failure: Secondary | ICD-10-CM | POA: Diagnosis not present

## 2015-02-10 DIAGNOSIS — L03115 Cellulitis of right lower limb: Secondary | ICD-10-CM | POA: Diagnosis not present

## 2015-02-10 DIAGNOSIS — E119 Type 2 diabetes mellitus without complications: Secondary | ICD-10-CM | POA: Diagnosis not present

## 2015-02-10 DIAGNOSIS — E039 Hypothyroidism, unspecified: Secondary | ICD-10-CM | POA: Diagnosis not present

## 2015-02-10 DIAGNOSIS — I509 Heart failure, unspecified: Secondary | ICD-10-CM | POA: Diagnosis not present

## 2015-02-10 DIAGNOSIS — J449 Chronic obstructive pulmonary disease, unspecified: Secondary | ICD-10-CM | POA: Diagnosis not present

## 2015-02-11 ENCOUNTER — Ambulatory Visit: Payer: Medicare Other | Admitting: Family Medicine

## 2015-02-12 DIAGNOSIS — I509 Heart failure, unspecified: Secondary | ICD-10-CM | POA: Diagnosis not present

## 2015-02-12 DIAGNOSIS — E119 Type 2 diabetes mellitus without complications: Secondary | ICD-10-CM | POA: Diagnosis not present

## 2015-02-12 DIAGNOSIS — J449 Chronic obstructive pulmonary disease, unspecified: Secondary | ICD-10-CM | POA: Diagnosis not present

## 2015-02-12 DIAGNOSIS — I110 Hypertensive heart disease with heart failure: Secondary | ICD-10-CM | POA: Diagnosis not present

## 2015-02-12 DIAGNOSIS — E039 Hypothyroidism, unspecified: Secondary | ICD-10-CM | POA: Diagnosis not present

## 2015-02-12 DIAGNOSIS — L03115 Cellulitis of right lower limb: Secondary | ICD-10-CM | POA: Diagnosis not present

## 2015-02-16 DIAGNOSIS — J449 Chronic obstructive pulmonary disease, unspecified: Secondary | ICD-10-CM | POA: Diagnosis not present

## 2015-02-16 DIAGNOSIS — I509 Heart failure, unspecified: Secondary | ICD-10-CM | POA: Diagnosis not present

## 2015-02-16 DIAGNOSIS — L03115 Cellulitis of right lower limb: Secondary | ICD-10-CM | POA: Diagnosis not present

## 2015-02-16 DIAGNOSIS — E119 Type 2 diabetes mellitus without complications: Secondary | ICD-10-CM | POA: Diagnosis not present

## 2015-02-16 DIAGNOSIS — I110 Hypertensive heart disease with heart failure: Secondary | ICD-10-CM | POA: Diagnosis not present

## 2015-02-16 DIAGNOSIS — E039 Hypothyroidism, unspecified: Secondary | ICD-10-CM | POA: Diagnosis not present

## 2015-02-18 ENCOUNTER — Encounter: Payer: Medicare Other | Admitting: Cardiology

## 2015-02-18 NOTE — Progress Notes (Signed)
No show for

## 2015-02-19 DIAGNOSIS — I110 Hypertensive heart disease with heart failure: Secondary | ICD-10-CM | POA: Diagnosis not present

## 2015-02-19 DIAGNOSIS — J449 Chronic obstructive pulmonary disease, unspecified: Secondary | ICD-10-CM | POA: Diagnosis not present

## 2015-02-19 DIAGNOSIS — E1151 Type 2 diabetes mellitus with diabetic peripheral angiopathy without gangrene: Secondary | ICD-10-CM | POA: Diagnosis not present

## 2015-02-19 DIAGNOSIS — L03115 Cellulitis of right lower limb: Secondary | ICD-10-CM | POA: Diagnosis not present

## 2015-02-19 DIAGNOSIS — L110 Acquired keratosis follicularis: Secondary | ICD-10-CM | POA: Diagnosis not present

## 2015-02-19 DIAGNOSIS — I509 Heart failure, unspecified: Secondary | ICD-10-CM | POA: Diagnosis not present

## 2015-02-19 DIAGNOSIS — L609 Nail disorder, unspecified: Secondary | ICD-10-CM | POA: Diagnosis not present

## 2015-02-19 DIAGNOSIS — E039 Hypothyroidism, unspecified: Secondary | ICD-10-CM | POA: Diagnosis not present

## 2015-02-19 DIAGNOSIS — E1140 Type 2 diabetes mellitus with diabetic neuropathy, unspecified: Secondary | ICD-10-CM | POA: Diagnosis not present

## 2015-02-19 DIAGNOSIS — E119 Type 2 diabetes mellitus without complications: Secondary | ICD-10-CM | POA: Diagnosis not present

## 2015-02-21 ENCOUNTER — Ambulatory Visit (INDEPENDENT_AMBULATORY_CARE_PROVIDER_SITE_OTHER): Payer: Medicare Other | Admitting: Nurse Practitioner

## 2015-02-21 ENCOUNTER — Encounter: Payer: Self-pay | Admitting: Nurse Practitioner

## 2015-02-21 VITALS — BP 136/73 | HR 76 | Temp 97.6°F | Ht 73.0 in | Wt 297.0 lb

## 2015-02-21 DIAGNOSIS — B356 Tinea cruris: Secondary | ICD-10-CM | POA: Diagnosis not present

## 2015-02-21 MED ORDER — FLUCONAZOLE 150 MG PO TABS: ORAL_TABLET | ORAL | Status: DC

## 2015-02-21 MED ORDER — NYSTATIN 100000 UNIT/GM EX CREA: 1.0000 "application " | TOPICAL_CREAM | Freq: Two times a day (BID) | CUTANEOUS | Status: DC

## 2015-02-21 NOTE — Progress Notes (Signed)
   Subjective:    Patient ID: [name], male    DOB: 07/03/1945, 70 y.o.   MRN: [id number]  HPI Patient comes in today c/o rash- has been there for awhile- he has been using powder on it- burning and itching.    Review of Systems  Constitutional: Negative.   HENT: Negative.   Respiratory: Negative.   Cardiovascular: Negative.   Genitourinary: Negative.   Neurological: Negative.   Psychiatric/Behavioral: Negative.   All other systems reviewed and are negative.      Objective:   Physical Exam  Constitutional: He appears well-developed and well-nourished.  Cardiovascular: Normal rate, regular rhythm and normal heart sounds.   Pulmonary/Chest: Effort normal and breath sounds normal.  Skin: Skin is warm.  Erythematous moist rash in bil groin area  Psychiatric: He has a normal mood and affect. His behavior is normal. Judgment and thought content normal.   BP 136/73 mmHg  Pulse 76  Temp(Src) 97.6 F (36.4 C) (Oral)  Ht 6\' 1"  (1.854 m)  Wt 297 lb (134.718 kg)  BMI 39.19 kg/m2        Assessment & Plan:  1. Jock itch Keep area clean and dry Avoid scratching RTO prn - nystatin cream (MYCOSTATIN); Apply 1 application topically 2 (two) times daily.  Dispense: 30 g; Refill: 0 - fluconazole (DIFLUCAN) 150 MG tablet; 1 po q week x 4 weeks  Dispense: 4 tablet; Refill: 0   Mary-Margaret Hassell Done, FNP

## 2015-02-21 NOTE — Patient Instructions (Signed)
Jock Itch  Jock itch (tinea cruris) is a fungal infection of the skin in the groin area. It is sometimes called ringworm, even though it is not caused by worms. It is caused by a fungus, which is a type of germ that thrives in dark, damp places. Jock itch causes a rash and itching in the groin and upper thigh area. It usually goes away in 2-3 weeks with treatment.  CAUSES  The fungus that causes jock itch may be spread by:  · Touching a fungus infection elsewhere on your body--such as athlete's foot--and then touching your groin area.  · Sharing towels or clothing with an infected person.  RISK FACTORS  Jock itch is most common in men and adolescent boys. This condition is more likely to develop from:  · Being in hot, humid climates.  · Wearing tight-fitting clothing or wet bathing suits for long periods of time.  · Participating in sports.  · Being overweight.  · Having diabetes.  SYMPTOMS  Symptoms of jock itch may include:  · A red, pink, or brown rash in the groin area. The rash may spread to the thighs, anus, and buttocks.  · Dry and scaly skin on or around the rash.  · Itchiness.  DIAGNOSIS  Most often, a health care provider can make the diagnosis by looking at your rash. Sometimes, a scraping of the infected skin will be taken. This sample may be tested by looking at it under a microscope or by trying to grow the fungus from the sample (culture).   TREATMENT  Treatment for this condition may include:  · Antifungal medicine to kill the fungus. This may be in various forms:    Skin cream or ointment.    Medicine taken by mouth.  · Skin cream or ointment to reduce the itching.  · Compresses or medicated powders to dry the infected skin.  HOME CARE INSTRUCTIONS  · Take medicines only as directed by your health care provider. Apply skin creams or ointments exactly as directed.  · Wear loose-fitting clothing.    Men should wear cotton boxer shorts.    Women should wear cotton underwear.  · Change your underwear  every day to keep your groin dry.  · Avoid hot baths.  · Dry your groin area well after bathing.    Use a separate towel to dry your groin area. This will help to prevent a spreading of the infection to other areas of your body.  · Do not scratch the affected area.  · Do not share towels with other people.  SEEK MEDICAL CARE IF:  · Your rash does not improve or it gets worse after 2 weeks of treatment.  · Your rash is spreading.  · Your rash returns after treatment is finished.  · You have a fever.  · You have redness, swelling, or pain in the area around your rash.  · You have fluid, blood, or pus coming from your rash.  · Your have your rash for more than 4 weeks.     This information is not intended to replace advice given to you by your health care provider. Make sure you discuss any questions you have with your health care provider.     Document Released: 12/31/2001 Document Revised: 01/31/2014 Document Reviewed: 10/22/2013  Elsevier Interactive Patient Education ©2016 Elsevier Inc.

## 2015-02-24 DIAGNOSIS — J449 Chronic obstructive pulmonary disease, unspecified: Secondary | ICD-10-CM | POA: Diagnosis not present

## 2015-02-24 DIAGNOSIS — I509 Heart failure, unspecified: Secondary | ICD-10-CM | POA: Diagnosis not present

## 2015-02-24 DIAGNOSIS — E119 Type 2 diabetes mellitus without complications: Secondary | ICD-10-CM | POA: Diagnosis not present

## 2015-02-24 DIAGNOSIS — L03115 Cellulitis of right lower limb: Secondary | ICD-10-CM | POA: Diagnosis not present

## 2015-02-24 DIAGNOSIS — I110 Hypertensive heart disease with heart failure: Secondary | ICD-10-CM | POA: Diagnosis not present

## 2015-02-24 DIAGNOSIS — E039 Hypothyroidism, unspecified: Secondary | ICD-10-CM | POA: Diagnosis not present

## 2015-02-26 DIAGNOSIS — I110 Hypertensive heart disease with heart failure: Secondary | ICD-10-CM | POA: Diagnosis not present

## 2015-02-26 DIAGNOSIS — J449 Chronic obstructive pulmonary disease, unspecified: Secondary | ICD-10-CM | POA: Diagnosis not present

## 2015-02-26 DIAGNOSIS — L03115 Cellulitis of right lower limb: Secondary | ICD-10-CM | POA: Diagnosis not present

## 2015-02-26 DIAGNOSIS — E039 Hypothyroidism, unspecified: Secondary | ICD-10-CM | POA: Diagnosis not present

## 2015-02-26 DIAGNOSIS — E119 Type 2 diabetes mellitus without complications: Secondary | ICD-10-CM | POA: Diagnosis not present

## 2015-02-26 DIAGNOSIS — I509 Heart failure, unspecified: Secondary | ICD-10-CM | POA: Diagnosis not present

## 2015-02-28 DIAGNOSIS — B356 Tinea cruris: Secondary | ICD-10-CM | POA: Diagnosis not present

## 2015-02-28 DIAGNOSIS — R21 Rash and other nonspecific skin eruption: Secondary | ICD-10-CM | POA: Diagnosis not present

## 2015-03-02 ENCOUNTER — Ambulatory Visit (INDEPENDENT_AMBULATORY_CARE_PROVIDER_SITE_OTHER): Payer: Medicare Other | Admitting: Pediatrics

## 2015-03-02 ENCOUNTER — Telehealth: Payer: Self-pay | Admitting: Pediatrics

## 2015-03-02 ENCOUNTER — Encounter: Payer: Self-pay | Admitting: Pediatrics

## 2015-03-02 VITALS — BP 158/81 | HR 69 | Temp 96.2°F | Ht 73.0 in | Wt 290.0 lb

## 2015-03-02 DIAGNOSIS — R21 Rash and other nonspecific skin eruption: Secondary | ICD-10-CM | POA: Diagnosis not present

## 2015-03-02 DIAGNOSIS — B354 Tinea corporis: Secondary | ICD-10-CM

## 2015-03-02 MED ORDER — TERBINAFINE HCL 250 MG PO TABS: 250.0000 mg | ORAL_TABLET | Freq: Every day | ORAL | Status: DC

## 2015-03-02 NOTE — Telephone Encounter (Signed)
Attempted to contact patient to see what medications he is currently taking per Dr. Evette Doffing

## 2015-03-02 NOTE — Progress Notes (Signed)
Subjective:    Patient ID: [name], male    DOB: December 13, 1945, 70 y.o.   MRN: [id number]  CC: Rash   HPI: [name] is a 70 y.o. male presenting for Rash  Stayed 3.5 days at [facility] for rash all over body recently. Seen two days ago at urgent for return of rash. Now rash has been present for over a week per pt, though pt not a good historian for dates or timeline.  No fevers. Rash itches minimally, does not hurt. Started on betamethasone-clotrimazole cream, fluconazole PO for rash at urgent care.  Has had multiple courses of fluconazole now for groin rash and new skin rash. At admission in mid-Dec skin rash described as red papular rash, possible skin rash.    Depression screen [facility] 2/9 03/02/2015 01/13/2015 01/06/2015 12/01/2014 11/08/2014  Decreased Interest 0 0 0 0 0  Down, Depressed, Hopeless 0 0 0 0 0  PHQ - 2 Score 0 0 0 0 0  Altered sleeping - - - - -  Tired, decreased energy - - - - -  Change in appetite - - - - -  Feeling bad or failure about yourself  - - - - -  Trouble concentrating - - - - -  Moving slowly or fidgety/restless - - - - -  Suicidal thoughts - - - - -  PHQ-9 Score - - - - -     Relevant past medical, surgical, family and social history reviewed and updated as indicated. Interim medical history since our last visit reviewed. Allergies and medications reviewed and updated.    ROS: Per HPI unless specifically indicated above  History  Smoking status  . Former Smoker -- 0.50 packs/day for 12 years  . Types: Cigarettes  . Quit date: 09/20/1961  Smokeless tobacco  . Never Used    Past Medical History Patient Active Problem List   Diagnosis Date Noted  . Hallucination   . Diastolic CHF, acute on chronic (HCC) 01/01/2015  . CHF exacerbation ([place]) 01/01/2015  . Dyspnea 12/23/2014  . B12 deficiency 04/08/2014  . Lateral meniscal tear   . DM type 2, not at goal [facility]) 03/12/2014  . 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 GERD  (gastroesophageal reflux disease) 11/02/2012  . Anemia 04/02/2012  . Obesity 08/04/2010  . BPH (benign prostatic hypertrophy) 04/15/2010  . CAROTID OCCLUSIVE DISEASE 01/26/2010  . MURMUR 05/27/2009  . Aortic valve disease 05/27/2009  . Hyperlipidemia with target LDL less than 100 01/22/2009  . Depression 01/22/2009  . Essential hypertension 01/22/2009    Current Outpatient Prescriptions  Medication Sig Dispense Refill  . acarbose (PRECOSE) 100 MG tablet TAKE ONE TABLET BY MOUTH TWICE DAILY 180 tablet 0  . aspirin EC 81 MG tablet Take 81 mg by mouth daily.    . citalopram (CELEXA) 20 MG tablet Take 1 tablet (20 mg total) by mouth daily. 30 tablet 0  . furosemide (LASIX) 20 MG tablet Take 2 tablets (40 mg total) by mouth 2 (two) times daily. 30 tablet 4  . glipiZIDE (GLUCOTROL) 5 MG tablet Take 1 tablet (5 mg total) by mouth 2 (two) times daily before a meal. 180 tablet 1  . glucose blood test strip Test blood sugar bid. DX E11.9 100 each 5  . insulin detemir (LEVEMIR) 100 UNIT/ML injection Inject 0.3 mLs (30 Units total) into the skin 2 (two) times daily. 60 mL 11  . levothyroxine (SYNTHROID, LEVOTHROID) 200 MCG tablet Take 1 tablet (200 mcg total)  by mouth daily before breakfast. 90 tablet 3  . metFORMIN (GLUCOPHAGE) 1000 MG tablet Take 1 tablet (1,000 mg total) by mouth 2 (two) times daily with a meal. 180 tablet 3  . metoprolol tartrate (LOPRESSOR) 25 MG tablet TAKE ONE-HALF TABLET BY MOUTH TWICE DAILY 60 tablet 5  . Multiple Vitamins-Minerals (MENS MULTI VITAMIN & MINERAL PO) Take 1 tablet by mouth daily.      Marland Kitchen nystatin (MYCOSTATIN/NYSTOP) 100000 UNIT/GM POWD Apply to affected 30 g 0  . simvastatin (ZOCOR) 80 MG tablet Take 40 mg by mouth at bedtime.     . terbinafine (LAMISIL) 250 MG tablet Take 1 tablet (250 mg total) by mouth daily. 14 tablet 0   Current Facility-Administered Medications  Medication Dose Route Frequency Provider Last Rate Last Dose  . cyanocobalamin ((VITAMIN  B-12)) injection 1,000 mcg  1,000 mcg Intramuscular Q30 days Claretta Fraise, MD   1,000 mcg at 12/23/14 1110       Objective:    BP 158/81 mmHg  Pulse 69  Temp(Src) 96.2 F (35.7 C) (Oral)  Ht 6\' 1"  (1.854 m)  Wt 290 lb (131.543 kg)  BMI 38.27 kg/m2  Wt Readings from Last 3 Encounters:  03/02/15 290 lb (131.543 kg)  02/21/15 297 lb (134.718 kg)  02/05/15 292 lb (132.45 kg)     Gen: NAD, alert, cooperative with exam, NCAT EYES: EOMI, no scleral injection or icterus CV: NRRR, normal S1/S2, no murmur, distal pulses 2+ b/l Resp: CTABL, no wheezes, normal WOB Abd: +BS, soft, NTND.  Neuro: Alert and oriented, strength equal b/l UE and LE, coordination grossly normal Skin: abdomen, upper legs, upper arms, with 1 cm to 3 cm red raised plaque, some with scale, well-delineated  Skin scraping: with KOH positive for hyphae from plaque on abd    Assessment & Plan:    Jacori was seen today for rash, positive +KOH skin scraping for hyphae. Has had multiple courses of fluconazole over past 6 weeks. Switch to terbinafine. Referral to derm.   Diagnoses and all orders for this visit:  Tinea corporis -     terbinafine (LAMISIL) 250 MG tablet; Take 1 tablet (250 mg total) by mouth daily.  Rash and nonspecific skin eruption -     Ambulatory referral to Dermatology   Follow up plan: 3 weeks  Assunta Found, MD [facility] 03/02/2015, 3:29 PM

## 2015-03-02 NOTE — Telephone Encounter (Signed)
Patient aware to stop taking fluconazole and creams from urgent care. He is to start taking Lamisil and nystatin powder in groin area.  Per Dr. Evette Doffing

## 2015-03-03 DIAGNOSIS — I509 Heart failure, unspecified: Secondary | ICD-10-CM | POA: Diagnosis not present

## 2015-03-03 DIAGNOSIS — E119 Type 2 diabetes mellitus without complications: Secondary | ICD-10-CM | POA: Diagnosis not present

## 2015-03-03 DIAGNOSIS — J449 Chronic obstructive pulmonary disease, unspecified: Secondary | ICD-10-CM | POA: Diagnosis not present

## 2015-03-03 DIAGNOSIS — L03115 Cellulitis of right lower limb: Secondary | ICD-10-CM | POA: Diagnosis not present

## 2015-03-03 DIAGNOSIS — E039 Hypothyroidism, unspecified: Secondary | ICD-10-CM | POA: Diagnosis not present

## 2015-03-03 DIAGNOSIS — I110 Hypertensive heart disease with heart failure: Secondary | ICD-10-CM | POA: Diagnosis not present

## 2015-03-05 DIAGNOSIS — E119 Type 2 diabetes mellitus without complications: Secondary | ICD-10-CM | POA: Diagnosis not present

## 2015-03-05 DIAGNOSIS — I110 Hypertensive heart disease with heart failure: Secondary | ICD-10-CM | POA: Diagnosis not present

## 2015-03-05 DIAGNOSIS — I509 Heart failure, unspecified: Secondary | ICD-10-CM | POA: Diagnosis not present

## 2015-03-05 DIAGNOSIS — E039 Hypothyroidism, unspecified: Secondary | ICD-10-CM | POA: Diagnosis not present

## 2015-03-05 DIAGNOSIS — L03115 Cellulitis of right lower limb: Secondary | ICD-10-CM | POA: Diagnosis not present

## 2015-03-05 DIAGNOSIS — J449 Chronic obstructive pulmonary disease, unspecified: Secondary | ICD-10-CM | POA: Diagnosis not present

## 2015-03-06 ENCOUNTER — Encounter: Payer: Self-pay | Admitting: Family

## 2015-03-06 ENCOUNTER — Ambulatory Visit (INDEPENDENT_AMBULATORY_CARE_PROVIDER_SITE_OTHER): Payer: Medicare Other | Admitting: Family

## 2015-03-06 VITALS — BP 140/64 | HR 75 | Temp 97.5°F | Ht 73.0 in | Wt 286.0 lb

## 2015-03-06 DIAGNOSIS — R300 Dysuria: Secondary | ICD-10-CM | POA: Diagnosis not present

## 2015-03-06 DIAGNOSIS — N3001 Acute cystitis with hematuria: Secondary | ICD-10-CM

## 2015-03-06 LAB — POCT URINALYSIS DIPSTICK
Bilirubin, UA: NEGATIVE
GLUCOSE UA: 250
KETONES UA: NEGATIVE
Nitrite, UA: NEGATIVE
Protein, UA: NEGATIVE
SPEC GRAV UA: 1.01
Urobilinogen, UA: NEGATIVE
pH, UA: 6

## 2015-03-06 LAB — POCT UA - MICROSCOPIC ONLY
CASTS, UR, LPF, POC: NEGATIVE
Crystals, Ur, HPF, POC: NEGATIVE
MUCUS UA: NEGATIVE
YEAST UA: NEGATIVE

## 2015-03-06 MED ORDER — CIPROFLOXACIN HCL 500 MG PO TABS: 500.0000 mg | ORAL_TABLET | Freq: Two times a day (BID) | ORAL | Status: DC

## 2015-03-06 NOTE — Patient Instructions (Signed)

## 2015-03-06 NOTE — Progress Notes (Signed)
   Subjective:    Patient ID: [name], male    DOB: [birth year], 70 y.o.   MRN: [id number]  Dysuria  This is a new problem. The current episode started in the past 7 days. The problem has been gradually worsening. The quality of the pain is described as aching. The pain is at a severity of 3/10. The pain is mild. There has been no fever. Associated symptoms include flank pain, frequency and urgency. Pertinent negatives include no chills, discharge, hematuria, nausea or vomiting. He has tried increased fluids for the symptoms. The treatment provided mild relief.      Review of Systems  Constitutional: Negative.  Negative for chills.  HENT: Negative.   Respiratory: Negative.   Cardiovascular: Negative.   Gastrointestinal: Negative.  Negative for nausea and vomiting.  Endocrine: Negative.   Genitourinary: Positive for dysuria, urgency, frequency and flank pain. Negative for hematuria.  Musculoskeletal: Negative.   Neurological: Negative.   Hematological: Negative.   Psychiatric/Behavioral: Negative.   All other systems reviewed and are negative.      Objective:   Physical Exam  Constitutional: He is oriented to person, place, and time. He appears well-developed and well-nourished. No distress.  HENT:  Head: Normocephalic.  Right Ear: External ear normal.  Left Ear: External ear normal.  Nose: Nose normal.  Mouth/Throat: Oropharynx is clear and moist.  Eyes: Pupils are equal, round, and reactive to light. Right eye exhibits no discharge. Left eye exhibits no discharge.  Neck: Normal range of motion. Neck supple. No thyromegaly present.  Cardiovascular: Normal rate, regular rhythm, normal heart sounds and intact distal pulses.   No murmur heard. Pulmonary/Chest: Effort normal and breath sounds normal. No respiratory distress. He has no wheezes.  Abdominal: Soft. Bowel sounds are normal. He exhibits no distension. There is no tenderness.  Musculoskeletal: Normal range of  motion. He exhibits no edema or tenderness.  Negative for CVA tenderness   Neurological: He is alert and oriented to person, place, and time. He has normal reflexes. No cranial nerve deficit.  Skin: Skin is warm and dry. No rash noted. No erythema.  Psychiatric: He has a normal mood and affect. His behavior is normal. Judgment and thought content normal.  Vitals reviewed.     BP 140/64 mmHg  Pulse 75  Temp(Src) 97.5 F (36.4 C) (Oral)  Ht 6\' 1"  (1.854 m)  Wt 286 lb (129.729 kg)  BMI 37.74 kg/m2     Assessment & Plan:  1. Dysuria - POCT urinalysis dipstick - POCT UA - Microscopic Only  2. Acute cystitis with hematuria -Force fluids RTO prn Culture pending - ciprofloxacin (CIPRO) 500 MG tablet; Take 1 tablet (500 mg total) by mouth 2 (two) times daily.  Dispense: 20 tablet; Refill: 0  Evelina Dun, FNP

## 2015-03-11 ENCOUNTER — Other Ambulatory Visit: Payer: Self-pay | Admitting: Family

## 2015-03-11 LAB — URINE CULTURE

## 2015-03-11 MED ORDER — SULFAMETHOXAZOLE-TRIMETHOPRIM 800-160 MG PO TABS: 1.0000 | ORAL_TABLET | Freq: Two times a day (BID) | ORAL | Status: DC

## 2015-03-12 ENCOUNTER — Telehealth: Payer: Self-pay | Admitting: Nurse Practitioner

## 2015-03-12 NOTE — Telephone Encounter (Signed)
Pt called- aware of how to take new med

## 2015-03-25 ENCOUNTER — Ambulatory Visit (INDEPENDENT_AMBULATORY_CARE_PROVIDER_SITE_OTHER): Payer: Medicare Other | Admitting: Pediatrics

## 2015-03-25 ENCOUNTER — Encounter: Payer: Self-pay | Admitting: Pediatrics

## 2015-03-25 VITALS — BP 144/71 | HR 68 | Temp 96.8°F | Ht 73.0 in | Wt 288.2 lb

## 2015-03-25 DIAGNOSIS — E119 Type 2 diabetes mellitus without complications: Secondary | ICD-10-CM

## 2015-03-25 DIAGNOSIS — F329 Major depressive disorder, single episode, unspecified: Secondary | ICD-10-CM

## 2015-03-25 DIAGNOSIS — E039 Hypothyroidism, unspecified: Secondary | ICD-10-CM | POA: Diagnosis not present

## 2015-03-25 DIAGNOSIS — Z1159 Encounter for screening for other viral diseases: Secondary | ICD-10-CM

## 2015-03-25 DIAGNOSIS — Z794 Long term (current) use of insulin: Secondary | ICD-10-CM

## 2015-03-25 DIAGNOSIS — I10 Essential (primary) hypertension: Secondary | ICD-10-CM

## 2015-03-25 DIAGNOSIS — N400 Enlarged prostate without lower urinary tract symptoms: Secondary | ICD-10-CM

## 2015-03-25 DIAGNOSIS — I5032 Chronic diastolic (congestive) heart failure: Secondary | ICD-10-CM

## 2015-03-25 DIAGNOSIS — F32A Depression, unspecified: Secondary | ICD-10-CM

## 2015-03-25 LAB — POCT GLYCOSYLATED HEMOGLOBIN (HGB A1C): Hemoglobin A1C: 7.3

## 2015-03-25 NOTE — Progress Notes (Addendum)
Subjective:    Patient ID: [name], male    DOB: [birth year], 70 y.o.   MRN: [id number]  CC: Follow-up multiple med problems  HPI: [name] is a 70 y.o. male presenting for Follow-up  DM2: taking meds daily. Taking BGLs daily, getting up to 200s, usually control is better at home  Mood has been fine, denies hallucinations  Having some incontinence, has follow up with urology. H/o BPH.   Rash completely gone, finished with antifungals.  Appetite is fine  No swelling legs or feet No trouble with breathing No hx of heart attack or stroke, does have CAD    Depression screen [facility] 2/9 03/25/2015 03/02/2015 01/13/2015 01/06/2015 12/01/2014  Decreased Interest 0 0 0 0 0  Down, Depressed, Hopeless 0 0 0 0 0  PHQ - 2 Score 0 0 0 0 0  Altered sleeping - - - - -  Tired, decreased energy - - - - -  Change in appetite - - - - -  Feeling bad or failure about yourself  - - - - -  Trouble concentrating - - - - -  Moving slowly or fidgety/restless - - - - -  Suicidal thoughts - - - - -  PHQ-9 Score - - - - -     Relevant past medical, surgical, family and social history reviewed and updated as indicated. Interim medical history since our last visit reviewed. Allergies and medications reviewed and updated.    ROS: Per HPI unless specifically indicated above  History  Smoking status  . Former Smoker -- 0.50 packs/day for 12 years  . Types: Cigarettes  . Quit date: 09/20/1961  Smokeless tobacco  . Never Used    Past Medical History Patient Active Problem List   Diagnosis Date Noted  . Hypothyroidism 03/25/2015  . Diastolic CHF, acute on chronic (HCC) 01/01/2015  . CHF exacerbation ([place]) 01/01/2015  . B12 deficiency 04/08/2014  . Lateral meniscal tear   . DM type 2, not at goal [facility]) 03/12/2014  . 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 GERD (gastroesophageal reflux disease) 11/02/2012  . Anemia 04/02/2012  . Obesity 08/04/2010  . BPH (benign prostatic  hypertrophy) 04/15/2010  . CAROTID OCCLUSIVE DISEASE 01/26/2010  . MURMUR 05/27/2009  . Aortic valve disease 05/27/2009  . Hyperlipidemia with target LDL less than 100 01/22/2009  . Depression 01/22/2009  . Essential hypertension 01/22/2009        Objective:    BP 144/71 mmHg  Pulse 68  Temp(Src) 96.8 F (36 C) (Oral)  Ht _0  (1.854 m)  Wt 288 lb 3.2 oz (130.727 kg)  BMI 38.03 kg/m2  Wt Readings from Last 3 Encounters:  03/25/15 288 lb 3.2 oz (130.727 kg)  03/06/15 286 lb (129.729 kg)  03/02/15 290 lb (131.543 kg)     Gen: NAD, alert, cooperative with exam, NCAT EYES: EOMI, no scleral injection or icterus ENT:  TMs pearly gray b/l, OP without erythema LYMPH: no cervical LAD CV: NRRR, normal S1/S2, no murmur, distal pulses 2+ b/l Resp: CTABL, no wheezes, normal WOB Abd: +BS, soft, NTND. no guarding or organomegaly Ext: No edema, warm Neuro: Alert and oriented, strength equal b/l UE and LE, coordination grossly normal MSK: normal muscle bulk Skin: rash improved, not visibile on trunk     Assessment & Plan:    Sirr was seen today for follow-up.  Diagnoses and all orders for this visit:  Essential hypertension Adequate control, cont curren tmeds  Type 2 diabetes  mellitus without complication, with long-term current use of insulin (HCC) HgA1c 7.3, if goes up anymore consider GLP-1 Completed foot exam today. -     POCT glycosylated hemoglobin (Hb A1C) -     CMP14+EGFR -     Microalbumin / creatinine urine ratio  BPH (benign prostatic hypertrophy) Has f/u with urology  Depression Well controlled at this time  Diastolic CHF, chronic ([place]) Euvolemic on exam  Hypothyroidism, unspecified hypothyroidism type TSH continues to rise, now 10, was 5 last check. Will add 61mg so pt to take 2258m daily. Recheck in 2 months. 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  CaAssunta FoundMD [facility] 03/25/2015, 10:35 AM

## 2015-03-26 LAB — CMP14+EGFR
ALT: 16 IU/L (ref 0–44)
AST: 18 IU/L (ref 0–40)
Albumin/Globulin Ratio: 1.4 (ref 1.1–2.5)
Albumin: 4 g/dL (ref 3.5–4.8)
Alkaline Phosphatase: 52 IU/L (ref 39–117)
BUN/Creatinine Ratio: 20 (ref 10–22)
BUN: 26 mg/dL (ref 8–27)
Bilirubin Total: 0.3 mg/dL (ref 0.0–1.2)
CALCIUM: 9.2 mg/dL (ref 8.6–10.2)
CHLORIDE: 97 mmol/L (ref 96–106)
CO2: 24 mmol/L (ref 18–29)
CREATININE: 1.28 mg/dL — AB (ref 0.76–1.27)
GFR, EST AFRICAN AMERICAN: 65 mL/min/{1.73_m2} (ref >59)
GFR, EST NON AFRICAN AMERICAN: 56 mL/min/{1.73_m2} — AB (ref >59)
GLUCOSE: 260 mg/dL — AB (ref 65–99)
Globulin, Total: 2.9 g/dL (ref 1.5–4.5)
Potassium: 4.8 mmol/L (ref 3.5–5.2)
Sodium: 136 mmol/L (ref 134–144)
TOTAL PROTEIN: 6.9 g/dL (ref 6.0–8.5)

## 2015-03-26 LAB — HEPATITIS C ANTIBODY

## 2015-03-26 LAB — TSH: TSH: 10.13 u[IU]/mL — AB (ref 0.450–4.500)

## 2015-03-26 LAB — MICROALBUMIN / CREATININE URINE RATIO
Creatinine, Urine: 43.2 mg/dL
MICROALB/CREAT RATIO: 18.1 mg/g{creat} (ref 0.0–30.0)
Microalbumin, Urine: 7.8 ug/mL

## 2015-03-27 MED ORDER — LEVOTHYROXINE SODIUM 25 MCG PO TABS: 25.0000 ug | ORAL_TABLET | Freq: Every day | ORAL | Status: DC

## 2015-03-27 NOTE — Addendum Note (Signed)
Addended by: Eustaquio Maize on: 03/27/2015 08:59 AM   Modules accepted: Orders

## 2015-03-27 NOTE — Addendum Note (Signed)
Addended by: Eustaquio Maize on: 03/27/2015 09:11 AM   Modules accepted: Orders

## 2015-04-01 ENCOUNTER — Telehealth: Payer: Self-pay | Admitting: Nurse Practitioner

## 2015-04-01 DIAGNOSIS — H3563 Retinal hemorrhage, bilateral: Secondary | ICD-10-CM | POA: Diagnosis not present

## 2015-04-01 DIAGNOSIS — H35043 Retinal micro-aneurysms, unspecified, bilateral: Secondary | ICD-10-CM | POA: Diagnosis not present

## 2015-04-01 DIAGNOSIS — H43811 Vitreous degeneration, right eye: Secondary | ICD-10-CM | POA: Diagnosis not present

## 2015-04-01 DIAGNOSIS — E113493 Type 2 diabetes mellitus with severe nonproliferative diabetic retinopathy without macular edema, bilateral: Secondary | ICD-10-CM | POA: Diagnosis not present

## 2015-04-01 LAB — HM DIABETES EYE EXAM

## 2015-04-02 NOTE — Telephone Encounter (Signed)
Patient complains of urinary frequency. No dysuria or hematuria. Volume is normal and he doesn't have any difficulty with voiding. He has a urology follow up next week. His blood sugars have been running a little higher than normal. This morning it was 195. He is prescribed Lantus 30u BID. He increased it to 32 units BID. He takes his morning dose before breakfast and his evening dose around 6:30. He asked if we could give him something to decrease his urination. Advised that if the frequency is due to elev blood sugar that it is best to try and control the blood sugar instead of giving him something to decrease his urinary frequency.  Will forward to his PCP for review.

## 2015-04-02 NOTE — Telephone Encounter (Signed)
Just continue what he is doing and keep appointment with urologist

## 2015-04-02 NOTE — Telephone Encounter (Signed)
Pt aware and understanding.

## 2015-04-06 DIAGNOSIS — N4883 Acquired buried penis: Secondary | ICD-10-CM | POA: Diagnosis not present

## 2015-04-06 DIAGNOSIS — R300 Dysuria: Secondary | ICD-10-CM | POA: Diagnosis not present

## 2015-04-06 DIAGNOSIS — N390 Urinary tract infection, site not specified: Secondary | ICD-10-CM | POA: Diagnosis not present

## 2015-04-07 DIAGNOSIS — M25561 Pain in right knee: Secondary | ICD-10-CM | POA: Diagnosis not present

## 2015-04-14 DIAGNOSIS — M25561 Pain in right knee: Secondary | ICD-10-CM | POA: Diagnosis not present

## 2015-04-14 DIAGNOSIS — G8929 Other chronic pain: Secondary | ICD-10-CM | POA: Diagnosis not present

## 2015-04-20 DIAGNOSIS — M25561 Pain in right knee: Secondary | ICD-10-CM | POA: Diagnosis not present

## 2015-04-22 ENCOUNTER — Other Ambulatory Visit: Payer: Self-pay | Admitting: Orthopedic Surgery

## 2015-04-22 DIAGNOSIS — M47817 Spondylosis without myelopathy or radiculopathy, lumbosacral region: Secondary | ICD-10-CM

## 2015-04-24 ENCOUNTER — Telehealth: Payer: Self-pay | Admitting: *Deleted

## 2015-04-24 NOTE — Telephone Encounter (Signed)
Patient aware to increase insulin by 3 units.  Per Dr. Evette Doffing

## 2015-04-30 NOTE — Progress Notes (Signed)
HPI The patient presents for followup of aortic valve replacement.  He was in the hospital last year with volume overload.  He was quite severely volume overloaded at bedtime and is now down about 40 pounds. Of note during that hospitalization he had a workup for some altered mental status.  There  was an issue of progressive dementia with a question of vascular versus Lewy body raised in the hospital.  Neurocognitive testing was suggested as an outpatient. However, I don't see that this was pursued and he actually seems quite clear and ruled himself down here today. He is denying any new symptoms. The patient denies any new symptoms such as chest discomfort, neck or arm discomfort. There has been no new shortness of breath, PND or orthopnea. There have been no reported palpitations, presyncope or syncope.    Allergies  Allergen Reactions  . Phenothiazines Anaphylaxis  . Pioglitazone Swelling    Current Outpatient Prescriptions  Medication Sig Dispense Refill  . acarbose (PRECOSE) 100 MG tablet TAKE ONE TABLET BY MOUTH TWICE DAILY 180 tablet 0  . aspirin EC 81 MG tablet Take 81 mg by mouth daily.    . citalopram (CELEXA) 20 MG tablet Take 1 tablet (20 mg total) by mouth daily. 30 tablet 0  . furosemide (LASIX) 20 MG tablet Take 2 tablets (40 mg total) by mouth 2 (two) times daily. (Patient taking differently: Take 20 mg by mouth daily. ) 30 tablet 4  . glipiZIDE (GLUCOTROL) 5 MG tablet Take 1 tablet (5 mg total) by mouth 2 (two) times daily before a meal. 180 tablet 1  . glucose blood test strip Test blood sugar bid. DX E11.9 100 each 5  . insulin detemir (LEVEMIR) 100 UNIT/ML injection Inject 0.3 mLs (30 Units total) into the skin 2 (two) times daily. (Patient taking differently: Inject 38 Units into the skin 2 (two) times daily. ) 60 mL 11  . levothyroxine (LEVOTHROID) 25 MCG tablet Take 1 tablet (25 mcg total) by mouth daily before breakfast. 90 tablet 0  . levothyroxine (SYNTHROID,  LEVOTHROID) 200 MCG tablet Take 1 tablet (200 mcg total) by mouth daily before breakfast. 90 tablet 3  . metFORMIN (GLUCOPHAGE) 1000 MG tablet Take 1 tablet (1,000 mg total) by mouth 2 (two) times daily with a meal. 180 tablet 3  . metoprolol tartrate (LOPRESSOR) 25 MG tablet TAKE ONE-HALF TABLET BY MOUTH TWICE DAILY 60 tablet 5  . Multiple Vitamins-Minerals (MENS MULTI VITAMIN & MINERAL PO) Take 1 tablet by mouth daily.      . simvastatin (ZOCOR) 80 MG tablet Take 40 mg by mouth at bedtime.      Current Facility-Administered Medications  Medication Dose Route Frequency Provider Last Rate Last Dose  . cyanocobalamin ((VITAMIN B-12)) injection 1,000 mcg  1,000 mcg Intramuscular Q30 days Claretta Fraise, MD   1,000 mcg at 12/23/14 1110    Past Medical History  Diagnosis Date  . 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 BPH (benign prostatic hypertrophy) 04/15/2010  . DEPRESSION 01/22/2009  . HYPERCHOLESTEROLEMIA 01/22/2009  . 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 Morbid obesity ([place])   . Heart valve replaced   . Colon polyps   . Ulcer   . Heart murmur   . HYPERTENSION 01/22/2009    dr Casimer Lanius  . Arthritis   . Tremors of nervous system     ?ptsd    Past Surgical History  Procedure Laterality Date  . Appendectomy    . Aortic valve replacement  July 2006    #  20 stentless [place] porcine valve  . Dental surgery  05/2004    Dental extractions  . Heel spur surgery Bilateral     resection of heel spur  . Colonoscopy  04/24/2007    [name]: normal  . Tonsillectomy    . Bilateral cataract surg    . Colonoscopy with esophagogastroduodenoscopy (egd) N/A 04/05/2012    Procedure: COLONOSCOPY WITH ESOPHAGOGASTRODUODENOSCOPY (EGD);  Surgeon: Daneil Dolin, MD;  Location: [facility];  Service: Endoscopy;  Laterality: N/A;  10;15  . Thyroidectomy  03/21/2013    DR [name]  . Thyroidectomy N/A 03/21/2013    Procedure: THYROIDECTOMY;  Surgeon: [name], MD;  Location: [place];  Service: General;   Laterality: N/A;  . Knee arthroscopy with lateral menisectomy Right 04/04/2014    Procedure: KNEE ARTHROSCOPY WITH LATERAL MENISECTOMY;  Surgeon: Carole Civil, MD;  Location: [facility];  Service: Orthopedics;  Laterality: Right;   ROS:   As stated in the HPI and negative for all other systems.  PHYSICAL EXAM BP 112/66 mmHg  Pulse 70  Ht 6\' 2"  (1.88 m)  Wt 287 lb 12.8 oz (130.545 kg)  BMI 36.94 kg/m2 GENERAL:  No acute distress NECK:  No jugular venous distention, waveform within normal limits, carotid upstroke brisk and symmetric, no bruits, no thyromegaly LUNGS:  Clear to auscultation bilaterally CHEST:  Well healed sternotomy scar. HEART:  PMI not displaced or sustained,S1 and S2 within normal limits, no S3, no S4 no clicks, no rubs, systolic murmur mid peaking and  radiating into the carotids  ABD:  Flat, positive bowel sounds normal in frequency in pitch, no bruits, no rebound, no guarding, no midline pulsatile mass, no hepatomegaly, no splenomegaly, obese EXT:  2 plus pulses throughout, moderate edema, no cyanosis no clubbing NEURO:  Nonfocal    ASSESSMENT AND PLAN  CHRONIC SYSTOLIC AND DIASTOLIC HF:   The patient's had great volume control. He is watching his salt and fluid intake. He has daily weights. No change in therapy is indicated.  AORTIC VALVE REPLACEMENT, HX OF - He had stable valve prosthesis in the hospital on echocardiography. No change in therapy is indicated.   HYPERTENSION -  His blood pressure is currently controlled. He will continue the meds as listed.  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

## 2015-05-01 ENCOUNTER — Ambulatory Visit (INDEPENDENT_AMBULATORY_CARE_PROVIDER_SITE_OTHER): Payer: Medicare Other | Admitting: Cardiology

## 2015-05-01 ENCOUNTER — Encounter: Payer: Self-pay | Admitting: Cardiology

## 2015-05-01 VITALS — BP 112/66 | HR 70 | Ht 74.0 in | Wt 287.8 lb

## 2015-05-01 DIAGNOSIS — I779 Disorder of arteries and arterioles, unspecified: Secondary | ICD-10-CM

## 2015-05-01 DIAGNOSIS — I739 Peripheral vascular disease, unspecified: Principal | ICD-10-CM

## 2015-05-01 NOTE — Patient Instructions (Signed)
Your physician wants you to follow-up in: 6 Months in [place] will receive a reminder letter in the mail two months in advance. If you don't receive a letter, please call our office to schedule the follow-up appointment.  Your physician has requested that you have a carotid duplex. This test is an ultrasound of the carotid arteries in your neck. It looks at blood flow through these arteries that supply the brain with blood. Allow one hour for this exam. There are no restrictions or special instructions.

## 2015-05-06 ENCOUNTER — Inpatient Hospital Stay
Admission: RE | Admit: 2015-05-06 | Discharge: 2015-05-06 | Disposition: A | Discharge status: Home / Self Care | Payer: Medicare Other | Source: Ambulatory Visit | Attending: Orthopedic Surgery | Admitting: Orthopedic Surgery

## 2015-05-06 ENCOUNTER — Other Ambulatory Visit: Payer: Medicare Other

## 2015-05-06 DIAGNOSIS — Z952 Presence of prosthetic heart valve: Secondary | ICD-10-CM | POA: Diagnosis not present

## 2015-05-06 DIAGNOSIS — R030 Elevated blood-pressure reading, without diagnosis of hypertension: Secondary | ICD-10-CM | POA: Diagnosis not present

## 2015-05-06 DIAGNOSIS — H8110 Benign paroxysmal vertigo, unspecified ear: Secondary | ICD-10-CM | POA: Diagnosis not present

## 2015-05-06 DIAGNOSIS — Z79899 Other long term (current) drug therapy: Secondary | ICD-10-CM | POA: Diagnosis not present

## 2015-05-06 DIAGNOSIS — R42 Dizziness and giddiness: Secondary | ICD-10-CM | POA: Diagnosis not present

## 2015-05-06 DIAGNOSIS — R404 Transient alteration of awareness: Secondary | ICD-10-CM | POA: Diagnosis not present

## 2015-05-06 DIAGNOSIS — Z794 Long term (current) use of insulin: Secondary | ICD-10-CM | POA: Diagnosis not present

## 2015-05-06 DIAGNOSIS — E119 Type 2 diabetes mellitus without complications: Secondary | ICD-10-CM | POA: Diagnosis not present

## 2015-05-06 DIAGNOSIS — Z87891 Personal history of nicotine dependence: Secondary | ICD-10-CM | POA: Diagnosis not present

## 2015-05-06 DIAGNOSIS — I509 Heart failure, unspecified: Secondary | ICD-10-CM | POA: Diagnosis not present

## 2015-05-06 DIAGNOSIS — J449 Chronic obstructive pulmonary disease, unspecified: Secondary | ICD-10-CM | POA: Diagnosis not present

## 2015-05-06 NOTE — Discharge Instructions (Signed)
Myelogram Discharge Instructions  1. Go home and rest quietly for the next 24 hours.  It is important to lie flat for the next 24 hours.  Get up only to go to the restroom.  You may lie in the bed or on a couch on your back, your stomach, your left side or your right side.  You may have one pillow under your head.  You may have pillows between your knees while you are on your side or under your knees while you are on your back.  2. DO NOT drive today.  Recline the seat as far back as it will go, while still wearing your seat belt, on the way home.  3. You may get up to go to the bathroom as needed.  You may sit up for 10 minutes to eat.  You may resume your normal diet and medications unless otherwise indicated.  Drink lots of extra fluids today and tomorrow.  4. The incidence of headache, nausea, or vomiting is about 5% (one in 20 patients).  If you develop a headache, lie flat and drink plenty of fluids until the headache goes away.  Caffeinated beverages may be helpful.  If you develop severe nausea and vomiting or a headache that does not go away with flat bed rest, call [phone].  5. You may resume normal activities after your 24 hours of bed rest is over; however, do not exert yourself strongly or do any heavy lifting tomorrow. If when you get up you have a headache when standing, go back to bed and force fluids for another 24 hours.  6. Call your physician for a follow-up appointment.  The results of your myelogram will be sent directly to your physician by the following day.  7. If you have any questions or if complications develop after you arrive home, please call [phone].  Discharge instructions have been explained to the patient.  The patient, or the person responsible for the patient, fully understands these instructions.       May resume Celexa on May 07, 2015, after 1:00 pm.

## 2015-05-07 ENCOUNTER — Telehealth: Payer: Self-pay | Admitting: Nurse Practitioner

## 2015-05-13 DIAGNOSIS — E1151 Type 2 diabetes mellitus with diabetic peripheral angiopathy without gangrene: Secondary | ICD-10-CM | POA: Diagnosis not present

## 2015-05-13 DIAGNOSIS — L110 Acquired keratosis follicularis: Secondary | ICD-10-CM | POA: Diagnosis not present

## 2015-05-13 DIAGNOSIS — E1140 Type 2 diabetes mellitus with diabetic neuropathy, unspecified: Secondary | ICD-10-CM | POA: Diagnosis not present

## 2015-05-13 DIAGNOSIS — L609 Nail disorder, unspecified: Secondary | ICD-10-CM | POA: Diagnosis not present

## 2015-05-14 NOTE — Telephone Encounter (Signed)
Labs 03-25-15 faxed to Dr. Rosana Hoes [phone]

## 2015-05-15 ENCOUNTER — Encounter: Payer: Self-pay | Admitting: Family

## 2015-05-15 ENCOUNTER — Ambulatory Visit (INDEPENDENT_AMBULATORY_CARE_PROVIDER_SITE_OTHER): Payer: Medicare Other | Admitting: Family

## 2015-05-15 VITALS — BP 106/57 | HR 88 | Temp 97.7°F | Ht 74.0 in | Wt 284.0 lb

## 2015-05-15 DIAGNOSIS — I499 Cardiac arrhythmia, unspecified: Secondary | ICD-10-CM | POA: Diagnosis not present

## 2015-05-15 DIAGNOSIS — R531 Weakness: Secondary | ICD-10-CM | POA: Diagnosis not present

## 2015-05-15 DIAGNOSIS — Z7984 Long term (current) use of oral hypoglycemic drugs: Secondary | ICD-10-CM | POA: Diagnosis not present

## 2015-05-15 DIAGNOSIS — R300 Dysuria: Secondary | ICD-10-CM

## 2015-05-15 DIAGNOSIS — N3001 Acute cystitis with hematuria: Secondary | ICD-10-CM

## 2015-05-15 DIAGNOSIS — E119 Type 2 diabetes mellitus without complications: Secondary | ICD-10-CM | POA: Diagnosis not present

## 2015-05-15 DIAGNOSIS — Z952 Presence of prosthetic heart valve: Secondary | ICD-10-CM | POA: Diagnosis not present

## 2015-05-15 DIAGNOSIS — I509 Heart failure, unspecified: Secondary | ICD-10-CM | POA: Diagnosis not present

## 2015-05-15 DIAGNOSIS — R5381 Other malaise: Secondary | ICD-10-CM | POA: Diagnosis not present

## 2015-05-15 DIAGNOSIS — R110 Nausea: Secondary | ICD-10-CM | POA: Diagnosis not present

## 2015-05-15 DIAGNOSIS — W1839XA Other fall on same level, initial encounter: Secondary | ICD-10-CM | POA: Diagnosis not present

## 2015-05-15 DIAGNOSIS — J449 Chronic obstructive pulmonary disease, unspecified: Secondary | ICD-10-CM | POA: Diagnosis not present

## 2015-05-15 DIAGNOSIS — N390 Urinary tract infection, site not specified: Secondary | ICD-10-CM | POA: Diagnosis not present

## 2015-05-15 DIAGNOSIS — Z794 Long term (current) use of insulin: Secondary | ICD-10-CM | POA: Diagnosis not present

## 2015-05-15 DIAGNOSIS — Z79899 Other long term (current) drug therapy: Secondary | ICD-10-CM | POA: Diagnosis not present

## 2015-05-15 LAB — GLUCOSE HEMOCUE WAIVED: Glu Hemocue Waived: 246 mg/dL — ABNORMAL HIGH (ref 65–99)

## 2015-05-15 MED ORDER — CEFTRIAXONE SODIUM 1 G IJ SOLR
1.0000 g | Freq: Once | INTRAMUSCULAR | Status: AC
  Administered 2015-05-15: 1 g via INTRAMUSCULAR

## 2015-05-15 MED ORDER — CIPROFLOXACIN HCL 500 MG PO TABS: 500.0000 mg | ORAL_TABLET | Freq: Two times a day (BID) | ORAL | Status: DC

## 2015-05-15 NOTE — Progress Notes (Addendum)
   Subjective:    Patient ID: [name], male    DOB: 10/09/1945, 70 y.o.   MRN: [id number]  Dysuria  This is a new problem. The current episode started 1 to 4 weeks ago. The problem has been gradually worsening. The quality of the pain is described as burning. The pain is at a severity of 3/10. There has been no fever. Associated symptoms include flank pain, frequency and urgency. Pertinent negatives include no discharge, hematuria, hesitancy, nausea or vomiting. He has tried increased fluids for the symptoms. The treatment provided mild relief.      Review of Systems  Constitutional: Negative.   HENT: Negative.   Respiratory: Negative.   Cardiovascular: Negative.   Gastrointestinal: Negative.  Negative for nausea and vomiting.  Endocrine: Negative.   Genitourinary: Positive for dysuria, urgency, frequency and flank pain. Negative for hesitancy and hematuria.  Musculoskeletal: Negative.   Neurological: Negative.   Hematological: Negative.   Psychiatric/Behavioral: Negative.   All other systems reviewed and are negative.      Objective:   Physical Exam  Constitutional: He is oriented to person, place, and time. He appears well-developed and well-nourished. No distress.  HENT:  Head: Normocephalic.  Eyes: Pupils are equal, round, and reactive to light. Right eye exhibits no discharge. Left eye exhibits no discharge.  Neck: Normal range of motion. Neck supple. No thyromegaly present.  Cardiovascular: Normal rate, regular rhythm, normal heart sounds and intact distal pulses.   No murmur heard. Pulmonary/Chest: Effort normal and breath sounds normal. No respiratory distress. He has no wheezes.  Abdominal: Soft. Bowel sounds are normal. He exhibits no distension. There is no tenderness.  Musculoskeletal: Normal range of motion. He exhibits no edema or tenderness.  Negative for CVA tenderness   Neurological: He is alert and oriented to person, place, and time. He has normal  reflexes. No cranial nerve deficit.  Skin: Skin is warm and dry. No rash noted. No erythema. There is pallor.  Psychiatric: He has a normal mood and affect. His behavior is normal. Judgment and thought content normal.  Vitals reviewed.     BP 130/85 mmHg  Pulse 80  Temp(Src) 97.7 F (36.5 C) (Oral)  Ht 6\' 2"  (1.88 m)  Wt 284 lb (128.822 kg)  BMI 36.45 kg/m2     Assessment & Plan:  1. Dysuria - Urinalysis, Complete  2. Acute cystitis with hematuria -Force fluids AZO over the counter X2 days RTO 2 weeks Culture pending - Urine culture - cefTRIAXone (ROCEPHIN) injection 1 g; Inject 1 g into the muscle once. - ciprofloxacin (CIPRO) 500 MG tablet; Take 1 tablet (500 mg total) by mouth 2 (two) times daily.  Dispense: 20 tablet; Refill: 0  Evelina Dun, FNP  *Pt fell outside getting into car, because he was weak. PT reports feeling weak, but improved. PT's VS 112/61, 94% RA, 88 pulse. Glucose 246. PT states he is trembling.

## 2015-05-15 NOTE — Progress Notes (Signed)
We were called to the parking lot and notified that patient was sitting on ground beside his truck. Patient was picked up, placed in wheel chair and assisted back into the building. Patient's vital signs were BP 106/57 mmHg  Pulse 88  Temp(Src) 97.7 F (36.5 C) (Oral)  Ht 6\' 2"  (1.88 m)  Wt 284 lb (128.822 kg)  BMI 36.45 kg/m2  SpO2 95%  Patient is real weak and shaking. Bs was checked and it was 246. Per Marie 911 was called and patient will be transported by EMS for evaluation.

## 2015-05-15 NOTE — Patient Instructions (Signed)

## 2015-05-15 NOTE — Addendum Note (Signed)
Addended by: Shelbie Ammons on: 05/15/2015 02:27 PM   Modules accepted: Orders

## 2015-05-17 LAB — URINE CULTURE

## 2015-05-18 ENCOUNTER — Telehealth: Payer: Self-pay | Admitting: Nurse Practitioner

## 2015-05-19 ENCOUNTER — Telehealth: Payer: Self-pay | Admitting: Family

## 2015-05-19 NOTE — Telephone Encounter (Signed)
Pt has already spoken with Evelina Dun about 15 minutes ago per pt. He has no further questions or concerns.

## 2015-05-19 NOTE — Telephone Encounter (Signed)
Pt called and told to take force fluids and continue with cipro. RTO next week

## 2015-05-20 ENCOUNTER — Other Ambulatory Visit: Payer: Self-pay | Admitting: Family

## 2015-05-20 LAB — URINALYSIS, COMPLETE
Bilirubin, UA: NEGATIVE
KETONES UA: NEGATIVE
Nitrite, UA: NEGATIVE
Protein, UA: NEGATIVE
SPEC GRAV UA: 1.015 (ref 1.005–1.030)
Urobilinogen, Ur: 0.2 mg/dL (ref 0.2–1.0)
pH, UA: 6 (ref 5.0–7.5)

## 2015-05-20 LAB — MICROSCOPIC EXAMINATION: WBC, UA: >30 /hpf — AB (ref 0–?)

## 2015-05-20 MED ORDER — AMOXICILLIN 875 MG PO TABS: 875.0000 mg | ORAL_TABLET | Freq: Two times a day (BID) | ORAL | Status: DC

## 2015-05-21 ENCOUNTER — Emergency Department (HOSPITAL_COMMUNITY)
Admission: EM | Admit: 2015-05-21 | Discharge: 2015-05-21 | Disposition: A | Discharge status: Home / Self Care | Payer: Medicare Other | Source: Home / Self Care | Attending: Emergency Medicine | Admitting: Emergency Medicine

## 2015-05-21 ENCOUNTER — Ambulatory Visit: Payer: Medicare Other | Admitting: Family Medicine

## 2015-05-21 ENCOUNTER — Encounter (HOSPITAL_COMMUNITY): Payer: Self-pay | Admitting: *Deleted

## 2015-05-21 DIAGNOSIS — Z7982 Long term (current) use of aspirin: Secondary | ICD-10-CM

## 2015-05-21 DIAGNOSIS — I5033 Acute on chronic diastolic (congestive) heart failure: Secondary | ICD-10-CM | POA: Diagnosis not present

## 2015-05-21 DIAGNOSIS — Z79899 Other long term (current) drug therapy: Secondary | ICD-10-CM | POA: Insufficient documentation

## 2015-05-21 DIAGNOSIS — I10 Essential (primary) hypertension: Secondary | ICD-10-CM | POA: Insufficient documentation

## 2015-05-21 DIAGNOSIS — Z7984 Long term (current) use of oral hypoglycemic drugs: Secondary | ICD-10-CM | POA: Insufficient documentation

## 2015-05-21 DIAGNOSIS — I6789 Other cerebrovascular disease: Secondary | ICD-10-CM | POA: Diagnosis not present

## 2015-05-21 DIAGNOSIS — Z87891 Personal history of nicotine dependence: Secondary | ICD-10-CM

## 2015-05-21 DIAGNOSIS — F329 Major depressive disorder, single episode, unspecified: Secondary | ICD-10-CM | POA: Insufficient documentation

## 2015-05-21 DIAGNOSIS — N390 Urinary tract infection, site not specified: Secondary | ICD-10-CM | POA: Diagnosis not present

## 2015-05-21 DIAGNOSIS — R251 Tremor, unspecified: Secondary | ICD-10-CM

## 2015-05-21 DIAGNOSIS — N410 Acute prostatitis: Secondary | ICD-10-CM | POA: Diagnosis not present

## 2015-05-21 DIAGNOSIS — R1030 Lower abdominal pain, unspecified: Secondary | ICD-10-CM | POA: Diagnosis not present

## 2015-05-21 DIAGNOSIS — J180 Bronchopneumonia, unspecified organism: Secondary | ICD-10-CM | POA: Diagnosis not present

## 2015-05-21 DIAGNOSIS — M1990 Unspecified osteoarthritis, unspecified site: Secondary | ICD-10-CM

## 2015-05-21 DIAGNOSIS — Z794 Long term (current) use of insulin: Secondary | ICD-10-CM | POA: Insufficient documentation

## 2015-05-21 DIAGNOSIS — E119 Type 2 diabetes mellitus without complications: Secondary | ICD-10-CM | POA: Diagnosis not present

## 2015-05-21 DIAGNOSIS — R531 Weakness: Secondary | ICD-10-CM | POA: Diagnosis not present

## 2015-05-21 DIAGNOSIS — I110 Hypertensive heart disease with heart failure: Secondary | ICD-10-CM | POA: Diagnosis not present

## 2015-05-21 DIAGNOSIS — N503 Cyst of epididymis: Secondary | ICD-10-CM | POA: Diagnosis not present

## 2015-05-21 LAB — BASIC METABOLIC PANEL
Anion gap: 13 (ref 5–15)
BUN: 24 mg/dL — ABNORMAL HIGH (ref 6–20)
CHLORIDE: 98 mmol/L — AB (ref 101–111)
CO2: 23 mmol/L (ref 22–32)
Calcium: 9 mg/dL (ref 8.9–10.3)
Creatinine, Ser: 1.75 mg/dL — ABNORMAL HIGH (ref 0.61–1.24)
GFR calc Af Amer: 43 mL/min — ABNORMAL LOW (ref >60)
GFR calc non Af Amer: 37 mL/min — ABNORMAL LOW (ref >60)
Glucose, Bld: 194 mg/dL — ABNORMAL HIGH (ref 65–99)
POTASSIUM: 4.5 mmol/L (ref 3.5–5.1)
Sodium: 134 mmol/L — ABNORMAL LOW (ref 135–145)

## 2015-05-21 LAB — CBC WITH DIFFERENTIAL/PLATELET
Basophils Absolute: 0 10*3/uL (ref 0.0–0.1)
Basophils Relative: 0 %
Eosinophils Absolute: 0 10*3/uL (ref 0.0–0.7)
Eosinophils Relative: 0 %
HCT: 35.1 % — ABNORMAL LOW (ref 39.0–52.0)
HEMOGLOBIN: 12 g/dL — AB (ref 13.0–17.0)
LYMPHS ABS: 1.5 10*3/uL (ref 0.7–4.0)
LYMPHS PCT: 10 %
MCH: 31.9 pg (ref 26.0–34.0)
MCHC: 34.2 g/dL (ref 30.0–36.0)
MCV: 93.4 fL (ref 78.0–100.0)
MONOS PCT: 9 %
Monocytes Absolute: 1.4 10*3/uL — ABNORMAL HIGH (ref 0.1–1.0)
NEUTROS PCT: 81 %
Neutro Abs: 12.6 10*3/uL — ABNORMAL HIGH (ref 1.7–7.7)
Platelets: 175 10*3/uL (ref 150–400)
RBC: 3.76 MIL/uL — AB (ref 4.22–5.81)
RDW: 15.1 % (ref 11.5–15.5)
WBC: 15.6 10*3/uL — AB (ref 4.0–10.5)

## 2015-05-21 LAB — URINE MICROSCOPIC-ADD ON: RBC / HPF: NONE SEEN RBC/hpf (ref 0–5)

## 2015-05-21 LAB — RAPID URINE DRUG SCREEN, HOSP PERFORMED
AMPHETAMINES: NOT DETECTED
BENZODIAZEPINES: NOT DETECTED
Barbiturates: NOT DETECTED
COCAINE: NOT DETECTED
OPIATES: NOT DETECTED
TETRAHYDROCANNABINOL: NOT DETECTED

## 2015-05-21 LAB — URINALYSIS, ROUTINE W REFLEX MICROSCOPIC
BILIRUBIN URINE: NEGATIVE
Glucose, UA: NEGATIVE mg/dL
Hgb urine dipstick: NEGATIVE
NITRITE: NEGATIVE
Protein, ur: NEGATIVE mg/dL
pH: 5.5 (ref 5.0–8.0)

## 2015-05-21 NOTE — Discharge Instructions (Signed)
Call neurology for outpatient appointment regarding your trouble. Use walker at all times to help with balance and prevent falls   Tremor A tremor is trembling or shaking that you cannot control. Most tremors affect the hands or arms. Tremors can also affect the head, vocal cords, face, and other parts of the body. There are many types of tremors. Common types include:   Essential tremor. These usually occur in people over the age of 74. It may run in families and can happen in otherwise healthy people.   Resting tremor. These occur when the muscles are at rest, such as when your hands are resting in your lap. People with Parkinson disease often have resting tremors.   Postural tremor. These occur when you try to hold a pose, such as keeping your hands outstretched.   Kinetic tremor. These occur during purposeful movement, such as trying to touch a finger to your nose.   Task-specific tremor. These may occur when you perform tasks such as handwriting, speaking, or standing.   Psychogenic tremor. These dramatically lessen or disappear when you are distracted. They can happen in people of all ages.  Some types of tremors have no known cause. Tremors can also be a symptom of nervous system problems (neurological disorders) that may occur with aging. Some tremors go away with treatment while others do not.  HOME CARE INSTRUCTIONS Watch your tremor for any changes. The following actions may help to lessen any discomfort you are feeling:  Take medicines only as directed by your health care provider.   Limit alcohol intake to no more than 1 drink per day for nonpregnant women and 2 drinks per day for men. One drink equals 12 oz of beer, 5 oz of wine, or 1 oz of hard liquor.  Do not use any tobacco products, including cigarettes, chewing tobacco, or electronic cigarettes. If you need help quitting, ask your health care provider.   Avoid extreme heat or cold.   Limit the amount of  caffeine you consumeas directed by your health care provider.   Try to get 8 hours of sleep each night.  Find ways to manage your stress, such as meditation or yoga.  Keep all follow-up visits as directed by your health care provider. This is important. SEEK MEDICAL CARE IF:  You start having a tremor after starting a new medicine.  You have tremor with other symptoms such as:  Numbness.  Tingling.  Pain.  Weakness.  Your tremor gets worse.  Your tremor interferes with your day-to-day life.   This information is not intended to replace advice given to you by your health care provider. Make sure you discuss any questions you have with your health care provider.   Document Released: 12/31/2001 Document Revised: 01/31/2014 Document Reviewed: 07/08/2013 Elsevier Interactive Patient Education [employer].

## 2015-05-21 NOTE — ED Notes (Signed)
Pt being seen here for tremors, cbg 179

## 2015-05-21 NOTE — ED Provider Notes (Signed)
CSN: [id number]     Arrival date & time 05/21/15  1251 History   First MD Initiated Contact with Patient 05/21/15 1454     Chief Complaint  Patient presents with  . Tremors      HPI  Patient presented for evaluation of a resting tremor.  His ACE inhibitor over 40 years.  He states it flashes become worse.  Is now affecting his balance and coordination at home.  He's not had fall or injury.  He states he was seen and evaluated for this at [facility] last week and was given about recurrent infection.  He is asymptomatic for any symptoms.  No headache.  Developed fall months ago.  States his CT scan that was normal at 1. he was seen by a neurologist.  He was placed on medication.  He does not recall the medication, or his diagnosis.  He states that it "made me have bad dreams".  Past Medical History  Diagnosis Date  . 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 BPH (benign prostatic hypertrophy) 04/15/2010  . DEPRESSION 01/22/2009  . HYPERCHOLESTEROLEMIA 01/22/2009  . 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 Morbid obesity ([place])   . Heart valve replaced   . Colon polyps   . Ulcer   . Heart murmur   . HYPERTENSION 01/22/2009    dr Casimer Lanius  . Arthritis   . Tremors of nervous system     ?ptsd   Past Surgical History  Procedure Laterality Date  . Appendectomy    . Aortic valve replacement  July 2006    #20 stentless [place] porcine valve  . Dental surgery  05/2004    Dental extractions  . Heel spur surgery Bilateral     resection of heel spur  . Colonoscopy  04/24/2007    [name]: normal  . Tonsillectomy    . Bilateral cataract surg    . Colonoscopy with esophagogastroduodenoscopy (egd) N/A 04/05/2012    Procedure: COLONOSCOPY WITH ESOPHAGOGASTRODUODENOSCOPY (EGD);  Surgeon: Daneil Dolin, MD;  [facility];  Service: Endoscopy;  Laterality: N/A;  10;15  . Thyroidectomy  03/21/2013    DR [name]  . Thyroidectomy N/A 03/21/2013    Procedure: THYROIDECTOMY;  Surgeon: [name], MD;   Location: [place];  Service: General;  Laterality: N/A;  . Knee arthroscopy with lateral menisectomy Right 04/04/2014    Procedure: KNEE ARTHROSCOPY WITH LATERAL MENISECTOMY;  Surgeon: Carole Civil, MD;  Location: [facility];  Service: Orthopedics;  Laterality: Right;   Family History  Problem Relation Age of Onset  . Diabetes     Social History  Substance Use Topics  . Smoking status: Former Smoker -- 0.00 packs/day for 12 years    Types: Cigarettes    Quit date: 09/20/1961  . Smokeless tobacco: Never Used  . Alcohol Use: No    Review of Systems  Constitutional: Negative for fever, chills, diaphoresis, appetite change and fatigue.  HENT: Negative for mouth sores, sore throat and trouble swallowing.   Eyes: Negative for visual disturbance.  Respiratory: Negative for cough, chest tightness, shortness of breath and wheezing.   Cardiovascular: Negative for chest pain.  Gastrointestinal: Negative for nausea, vomiting, abdominal pain, diarrhea and abdominal distention.  Endocrine: Negative for polydipsia, polyphagia and polyuria.  Genitourinary: Negative for dysuria, frequency and hematuria.  Musculoskeletal: Negative for gait problem.  Skin: Negative for color change, pallor and rash.  Neurological: Positive for tremors. Negative for dizziness, syncope, light-headedness and headaches.  Hematological: Does not bruise/bleed easily.  Psychiatric/Behavioral: Negative for  behavioral problems and confusion.      Allergies  Phenothiazines and Actos  Home Medications   Prior to Admission medications   Medication Sig Start Date End Date Taking? Authorizing Provider  acarbose (PRECOSE) 100 MG tablet TAKE ONE TABLET BY MOUTH TWICE DAILY 11/04/14  Yes Claretta Fraise, MD  amoxicillin (AMOXIL) 875 MG tablet Take 1 tablet (875 mg total) by mouth 2 (two) times daily. 1 po BID 05/20/15  Yes Sharion Balloon, FNP  aspirin EC 81 MG tablet Take 81 mg by mouth daily.   Yes Historical Provider, MD   citalopram (CELEXA) 20 MG tablet Take 1 tablet (20 mg total) by mouth daily. 01/03/15  Yes Milagros Loll, MD  furosemide (LASIX) 20 MG tablet Take 2 tablets (40 mg total) by mouth 2 (two) times daily. Patient taking differently: Take 20 mg by mouth daily.  01/03/15  Yes Milagros Loll, MD  glipiZIDE (GLUCOTROL) 5 MG tablet Take 1 tablet (5 mg total) by mouth 2 (two) times daily before a meal. 11/24/14  Yes Claretta Fraise, MD  glucose blood test strip Test blood sugar bid. DX E11.9 12/02/14  Yes Claretta Fraise, MD  insulin detemir (LEVEMIR) 100 UNIT/ML injection Inject 0.3 mLs (30 Units total) into the skin 2 (two) times daily. Patient taking differently: Inject 38 Units into the skin 2 (two) times daily.  01/03/15  Yes Milagros Loll, MD  levothyroxine (LEVOTHROID) 25 MCG tablet Take 1 tablet (25 mcg total) by mouth daily before breakfast. 03/27/15  Yes Eustaquio Maize, MD  levothyroxine (SYNTHROID, LEVOTHROID) 200 MCG tablet Take 1 tablet (200 mcg total) by mouth daily before breakfast. 05/27/14  Yes Mary-Margaret Hassell Done, FNP  metFORMIN (GLUCOPHAGE) 1000 MG tablet Take 1 tablet (1,000 mg total) by mouth 2 (two) times daily with a meal. 03/07/14  Yes Mary-Margaret Hassell Done, FNP  metoprolol tartrate (LOPRESSOR) 25 MG tablet TAKE ONE-HALF TABLET BY MOUTH TWICE DAILY 07/02/14  Yes Claretta Fraise, MD  Multiple Vitamins-Minerals (MENS MULTI VITAMIN & MINERAL PO) Take 1 tablet by mouth daily.     Yes Historical Provider, MD  simvastatin (ZOCOR) 80 MG tablet Take 40 mg by mouth at bedtime.  07/10/14  Yes Historical Provider, MD   BP 146/68 mmHg  Pulse 50  Temp(Src) 98.5 F (36.9 C) (Oral)  Resp 20  Ht 6\' 1"  (1.854 m)  Wt 287 lb (130.182 kg)  BMI 37.87 kg/m2  SpO2 93% Physical Exam  Constitutional: He is oriented to person, place, and time. He appears well-developed and well-nourished. No distress.  HENT:  Head: Normocephalic.  Eyes: Conjunctivae are normal. Pupils are equal, round, and reactive to  light. No scleral icterus.  Neck: Normal range of motion. Neck supple. No thyromegaly present.  Cardiovascular: Normal rate and regular rhythm.  Exam reveals no gallop and no friction rub.   No murmur heard. Pulmonary/Chest: Effort normal and breath sounds normal. No respiratory distress. He has no wheezes. He has no rales.  Abdominal: Soft. Bowel sounds are normal. He exhibits no distension. There is no tenderness. There is no rebound.  Musculoskeletal: Normal range of motion.  Neurological: He is alert and oriented to person, place, and time.  Fine resting tremor.  Pill-rolling tremor.  Cogwheeling of his extremities.  Short shuffling gait.  Skin: Skin is warm and dry. No rash noted.  Psychiatric: He has a normal mood and affect. His behavior is normal.    ED Course  Procedures (including critical care time) Labs Review Labs Reviewed  CBC WITH DIFFERENTIAL/PLATELET - Abnormal; Notable for the following:    WBC 15.6 (*)    RBC 3.76 (*)    Hemoglobin 12.0 (*)    HCT 35.1 (*)    Neutro Abs 12.6 (*)    Monocytes Absolute 1.4 (*)    All other components within normal limits  BASIC METABOLIC PANEL - Abnormal; Notable for the following:    Sodium 134 (*)    Chloride 98 (*)    Glucose, Bld 194 (*)    BUN 24 (*)    Creatinine, Ser 1.75 (*)    GFR calc non Af Amer 37 (*)    GFR calc Af Amer 43 (*)    All other components within normal limits  URINALYSIS, ROUTINE W REFLEX MICROSCOPIC (NOT AT [facility]) - Abnormal; Notable for the following:    Specific Gravity, Urine <1.005 (*)    Ketones, ur TRACE (*)    Leukocytes, UA TRACE (*)    All other components within normal limits  URINE MICROSCOPIC-ADD ON - Abnormal; Notable for the following:    Squamous Epithelial / LPF 0-5 (*)    Bacteria, UA RARE (*)    All other components within normal limits  URINE RAPID DRUG SCREEN, HOSP PERFORMED    Imaging Review No results found. I have personally reviewed and evaluated these images and lab  results as part of my medical decision-making.   EKG Interpretation None      MDM   Final diagnoses:  Tremor    Solution symptoms and findings consistent with probable Parkinson's disease previously clear mental status and no mention clinically here.  Refer him to a local neurology.  He does have a walker at home that he used before after a previous injury.  I've asked him to use a walker for balance, and safety.    Tanna Furry, MD 05/21/15 [phone]

## 2015-05-21 NOTE — ED Notes (Signed)
Pt seen at [facility] recently and dx with UTI, pt currently taking antibiotics

## 2015-05-23 ENCOUNTER — Inpatient Hospital Stay (HOSPITAL_COMMUNITY)
Admission: EM | Admit: 2015-05-23 | Discharge: 2015-05-25 | DRG: 291 | Disposition: A | Discharge status: Home / Self Care | Payer: Medicare Other | Attending: Internal Medicine | Admitting: Internal Medicine

## 2015-05-23 ENCOUNTER — Emergency Department (HOSPITAL_COMMUNITY): Payer: Medicare Other

## 2015-05-23 ENCOUNTER — Telehealth: Payer: Self-pay | Admitting: Nurse Practitioner

## 2015-05-23 ENCOUNTER — Encounter (HOSPITAL_COMMUNITY): Payer: Self-pay | Admitting: Nurse Practitioner

## 2015-05-23 DIAGNOSIS — Z79899 Other long term (current) drug therapy: Secondary | ICD-10-CM

## 2015-05-23 DIAGNOSIS — J189 Pneumonia, unspecified organism: Secondary | ICD-10-CM | POA: Diagnosis not present

## 2015-05-23 DIAGNOSIS — Z6836 Body mass index (BMI) 36.0-36.9, adult: Secondary | ICD-10-CM

## 2015-05-23 DIAGNOSIS — Z888 Allergy status to other drugs, medicaments and biological substances status: Secondary | ICD-10-CM | POA: Diagnosis not present

## 2015-05-23 DIAGNOSIS — I951 Orthostatic hypotension: Secondary | ICD-10-CM | POA: Diagnosis present

## 2015-05-23 DIAGNOSIS — Z952 Presence of prosthetic heart valve: Secondary | ICD-10-CM

## 2015-05-23 DIAGNOSIS — I10 Essential (primary) hypertension: Secondary | ICD-10-CM | POA: Diagnosis not present

## 2015-05-23 DIAGNOSIS — R1030 Lower abdominal pain, unspecified: Secondary | ICD-10-CM | POA: Diagnosis not present

## 2015-05-23 DIAGNOSIS — I5033 Acute on chronic diastolic (congestive) heart failure: Secondary | ICD-10-CM | POA: Diagnosis not present

## 2015-05-23 DIAGNOSIS — R531 Weakness: Secondary | ICD-10-CM | POA: Diagnosis not present

## 2015-05-23 DIAGNOSIS — E039 Hypothyroidism, unspecified: Secondary | ICD-10-CM | POA: Diagnosis present

## 2015-05-23 DIAGNOSIS — J180 Bronchopneumonia, unspecified organism: Secondary | ICD-10-CM | POA: Diagnosis present

## 2015-05-23 DIAGNOSIS — N400 Enlarged prostate without lower urinary tract symptoms: Secondary | ICD-10-CM | POA: Diagnosis present

## 2015-05-23 DIAGNOSIS — Z7984 Long term (current) use of oral hypoglycemic drugs: Secondary | ICD-10-CM

## 2015-05-23 DIAGNOSIS — Z87891 Personal history of nicotine dependence: Secondary | ICD-10-CM | POA: Diagnosis not present

## 2015-05-23 DIAGNOSIS — Z792 Long term (current) use of antibiotics: Secondary | ICD-10-CM | POA: Diagnosis not present

## 2015-05-23 DIAGNOSIS — Z833 Family history of diabetes mellitus: Secondary | ICD-10-CM

## 2015-05-23 DIAGNOSIS — Z9841 Cataract extraction status, right eye: Secondary | ICD-10-CM | POA: Diagnosis not present

## 2015-05-23 DIAGNOSIS — N50812 Left testicular pain: Secondary | ICD-10-CM | POA: Diagnosis present

## 2015-05-23 DIAGNOSIS — Z7982 Long term (current) use of aspirin: Secondary | ICD-10-CM | POA: Diagnosis not present

## 2015-05-23 DIAGNOSIS — I110 Hypertensive heart disease with heart failure: Secondary | ICD-10-CM | POA: Diagnosis not present

## 2015-05-23 DIAGNOSIS — Z9842 Cataract extraction status, left eye: Secondary | ICD-10-CM | POA: Diagnosis not present

## 2015-05-23 DIAGNOSIS — Z794 Long term (current) use of insulin: Secondary | ICD-10-CM | POA: Diagnosis not present

## 2015-05-23 DIAGNOSIS — I5030 Unspecified diastolic (congestive) heart failure: Secondary | ICD-10-CM

## 2015-05-23 DIAGNOSIS — N390 Urinary tract infection, site not specified: Secondary | ICD-10-CM | POA: Diagnosis present

## 2015-05-23 DIAGNOSIS — E119 Type 2 diabetes mellitus without complications: Secondary | ICD-10-CM | POA: Diagnosis present

## 2015-05-23 DIAGNOSIS — G250 Essential tremor: Secondary | ICD-10-CM | POA: Diagnosis present

## 2015-05-23 DIAGNOSIS — E669 Obesity, unspecified: Secondary | ICD-10-CM

## 2015-05-23 DIAGNOSIS — N410 Acute prostatitis: Secondary | ICD-10-CM | POA: Diagnosis present

## 2015-05-23 DIAGNOSIS — N503 Cyst of epididymis: Secondary | ICD-10-CM | POA: Diagnosis not present

## 2015-05-23 DIAGNOSIS — Z86010 Personal history of colonic polyps: Secondary | ICD-10-CM

## 2015-05-23 DIAGNOSIS — I509 Heart failure, unspecified: Secondary | ICD-10-CM | POA: Diagnosis not present

## 2015-05-23 DIAGNOSIS — E785 Hyperlipidemia, unspecified: Secondary | ICD-10-CM

## 2015-05-23 LAB — CBC
HEMATOCRIT: 34.7 % — AB (ref 39.0–52.0)
HEMOGLOBIN: 11.5 g/dL — AB (ref 13.0–17.0)
MCH: 31.6 pg (ref 26.0–34.0)
MCHC: 33.1 g/dL (ref 30.0–36.0)
MCV: 95.3 fL (ref 78.0–100.0)
Platelets: 201 10*3/uL (ref 150–400)
RBC: 3.64 MIL/uL — AB (ref 4.22–5.81)
RDW: 14.8 % (ref 11.5–15.5)
WBC: 10.6 10*3/uL — ABNORMAL HIGH (ref 4.0–10.5)

## 2015-05-23 LAB — COMPREHENSIVE METABOLIC PANEL
ALT: 23 U/L (ref 17–63)
ANION GAP: 13 (ref 5–15)
AST: 29 U/L (ref 15–41)
Albumin: 2.9 g/dL — ABNORMAL LOW (ref 3.5–5.0)
Alkaline Phosphatase: 70 U/L (ref 38–126)
BILIRUBIN TOTAL: 1.2 mg/dL (ref 0.3–1.2)
BUN: 24 mg/dL — ABNORMAL HIGH (ref 6–20)
CO2: 24 mmol/L (ref 22–32)
Calcium: 9 mg/dL (ref 8.9–10.3)
Chloride: 99 mmol/L — ABNORMAL LOW (ref 101–111)
Creatinine, Ser: 1.64 mg/dL — ABNORMAL HIGH (ref 0.61–1.24)
GFR calc non Af Amer: 40 mL/min — ABNORMAL LOW (ref >60)
GFR, EST AFRICAN AMERICAN: 47 mL/min — AB (ref >60)
GLUCOSE: 180 mg/dL — AB (ref 65–99)
POTASSIUM: 4.4 mmol/L (ref 3.5–5.1)
Sodium: 136 mmol/L (ref 135–145)
TOTAL PROTEIN: 7.3 g/dL (ref 6.5–8.1)

## 2015-05-23 LAB — URINALYSIS, ROUTINE W REFLEX MICROSCOPIC
BILIRUBIN URINE: NEGATIVE
Glucose, UA: NEGATIVE mg/dL
KETONES UR: 15 mg/dL — AB
NITRITE: NEGATIVE
PH: 6 (ref 5.0–8.0)
Protein, ur: NEGATIVE mg/dL
SPECIFIC GRAVITY, URINE: 1.014 (ref 1.005–1.030)

## 2015-05-23 LAB — BRAIN NATRIURETIC PEPTIDE: B Natriuretic Peptide: 613 pg/mL — ABNORMAL HIGH (ref 0.0–100.0)

## 2015-05-23 LAB — URINE MICROSCOPIC-ADD ON

## 2015-05-23 LAB — GLUCOSE, CAPILLARY: Glucose-Capillary: 228 mg/dL — ABNORMAL HIGH (ref 65–99)

## 2015-05-23 MED ORDER — SODIUM CHLORIDE 0.9 % IV SOLN: 250.0000 mL | INTRAVENOUS | Status: DC | PRN

## 2015-05-23 MED ORDER — LEVOFLOXACIN IN D5W 750 MG/150ML IV SOLN
750.0000 mg | INTRAVENOUS | Status: DC
Start: 2015-05-23 — End: 2015-05-25
  Administered 2015-05-23 – 2015-05-24 (×2): 750 mg via INTRAVENOUS
  Filled 2015-05-23 (×2): qty 150

## 2015-05-23 MED ORDER — ATORVASTATIN CALCIUM 40 MG PO TABS
40.0000 mg | ORAL_TABLET | Freq: Every day | ORAL | Status: DC
  Administered 2015-05-24: 40 mg via ORAL
  Filled 2015-05-23: qty 1

## 2015-05-23 MED ORDER — INSULIN DETEMIR 100 UNIT/ML ~~LOC~~ SOLN
38.0000 [IU] | Freq: Two times a day (BID) | SUBCUTANEOUS | Status: DC
  Administered 2015-05-23 – 2015-05-25 (×4): 38 [IU] via SUBCUTANEOUS
  Filled 2015-05-23 (×5): qty 0.38

## 2015-05-23 MED ORDER — INSULIN ASPART 100 UNIT/ML ~~LOC~~ SOLN
0.0000 [IU] | Freq: Three times a day (TID) | SUBCUTANEOUS | Status: DC
  Administered 2015-05-24 (×2): 2 [IU] via SUBCUTANEOUS
  Administered 2015-05-24: 3 [IU] via SUBCUTANEOUS

## 2015-05-23 MED ORDER — ONDANSETRON HCL 4 MG/2ML IJ SOLN: 4.0000 mg | Freq: Four times a day (QID) | INTRAMUSCULAR | Status: DC | PRN

## 2015-05-23 MED ORDER — ENOXAPARIN SODIUM 40 MG/0.4ML ~~LOC~~ SOLN
40.0000 mg | Freq: Every day | SUBCUTANEOUS | Status: DC
  Administered 2015-05-24 – 2015-05-25 (×2): 40 mg via SUBCUTANEOUS
  Filled 2015-05-23 (×2): qty 0.4

## 2015-05-23 MED ORDER — CALCIUM CARBONATE-VITAMIN D 500-200 MG-UNIT PO TABS
1.0000 | ORAL_TABLET | Freq: Every day | ORAL | Status: DC
  Administered 2015-05-24 – 2015-05-25 (×2): 1 via ORAL
  Filled 2015-05-23 (×2): qty 1

## 2015-05-23 MED ORDER — SODIUM CHLORIDE 0.9% FLUSH: 3.0000 mL | INTRAVENOUS | Status: DC | PRN

## 2015-05-23 MED ORDER — TAMSULOSIN HCL 0.4 MG PO CAPS
0.4000 mg | ORAL_CAPSULE | Freq: Every day | ORAL | Status: DC
  Administered 2015-05-23 – 2015-05-24 (×2): 0.4 mg via ORAL
  Filled 2015-05-23 (×2): qty 1

## 2015-05-23 MED ORDER — ACETAMINOPHEN 650 MG RE SUPP
650.0000 mg | Freq: Four times a day (QID) | RECTAL | Status: DC | PRN
Start: 2015-05-23 — End: 2015-05-25

## 2015-05-23 MED ORDER — INSULIN ASPART 100 UNIT/ML ~~LOC~~ SOLN
0.0000 [IU] | Freq: Every day | SUBCUTANEOUS | Status: DC
  Administered 2015-05-23: 2 [IU] via SUBCUTANEOUS

## 2015-05-23 MED ORDER — FUROSEMIDE 10 MG/ML IJ SOLN
40.0000 mg | Freq: Once | INTRAMUSCULAR | Status: AC
  Administered 2015-05-23: 40 mg via INTRAVENOUS
  Filled 2015-05-23: qty 4

## 2015-05-23 MED ORDER — LEVOTHYROXINE SODIUM 100 MCG PO TABS
200.0000 ug | ORAL_TABLET | Freq: Every day | ORAL | Status: DC
  Administered 2015-05-24 – 2015-05-25 (×2): 200 ug via ORAL
  Filled 2015-05-23 (×2): qty 2

## 2015-05-23 MED ORDER — ASPIRIN EC 81 MG PO TBEC
81.0000 mg | DELAYED_RELEASE_TABLET | Freq: Every day | ORAL | Status: DC
  Administered 2015-05-23 – 2015-05-25 (×3): 81 mg via ORAL
  Filled 2015-05-23 (×3): qty 1

## 2015-05-23 MED ORDER — CITALOPRAM HYDROBROMIDE 10 MG PO TABS
10.0000 mg | ORAL_TABLET | Freq: Every day | ORAL | Status: DC
  Administered 2015-05-24: 10 mg via ORAL

## 2015-05-23 MED ORDER — OXYCODONE HCL 5 MG PO TABS: 5.0000 mg | ORAL_TABLET | ORAL | Status: DC | PRN

## 2015-05-23 MED ORDER — METOPROLOL TARTRATE 12.5 MG HALF TABLET
12.5000 mg | ORAL_TABLET | Freq: Two times a day (BID) | ORAL | Status: DC
  Administered 2015-05-23 – 2015-05-25 (×4): 12.5 mg via ORAL
  Filled 2015-05-23 (×4): qty 1

## 2015-05-23 MED ORDER — SODIUM CHLORIDE 0.9% FLUSH
3.0000 mL | Freq: Two times a day (BID) | INTRAVENOUS | Status: DC
  Administered 2015-05-24 – 2015-05-25 (×2): 3 mL via INTRAVENOUS

## 2015-05-23 MED ORDER — OMEGA-3-ACID ETHYL ESTERS 1 G PO CAPS
1.0000 g | ORAL_CAPSULE | Freq: Every day | ORAL | Status: DC
  Administered 2015-05-24 – 2015-05-25 (×2): 1 g via ORAL
  Filled 2015-05-23 (×2): qty 1

## 2015-05-23 MED ORDER — SODIUM CHLORIDE 0.9% FLUSH
3.0000 mL | Freq: Two times a day (BID) | INTRAVENOUS | Status: DC
Start: 2015-05-23 — End: 2015-05-25
  Administered 2015-05-23 – 2015-05-24 (×3): 3 mL via INTRAVENOUS

## 2015-05-23 MED ORDER — ACETAMINOPHEN 325 MG PO TABS: 650.0000 mg | ORAL_TABLET | Freq: Four times a day (QID) | ORAL | Status: DC | PRN

## 2015-05-23 MED ORDER — ONDANSETRON HCL 4 MG PO TABS: 4.0000 mg | ORAL_TABLET | Freq: Four times a day (QID) | ORAL | Status: DC | PRN

## 2015-05-23 MED ORDER — FUROSEMIDE 10 MG/ML IJ SOLN
40.0000 mg | Freq: Two times a day (BID) | INTRAMUSCULAR | Status: DC
  Administered 2015-05-24: 40 mg via INTRAVENOUS
  Filled 2015-05-23: qty 4

## 2015-05-23 MED ORDER — POTASSIUM CHLORIDE CRYS ER 10 MEQ PO TBCR
10.0000 meq | EXTENDED_RELEASE_TABLET | Freq: Two times a day (BID) | ORAL | Status: DC
  Administered 2015-05-24 – 2015-05-25 (×3): 10 meq via ORAL
  Filled 2015-05-23 (×3): qty 1

## 2015-05-23 MED ORDER — SODIUM CHLORIDE 0.9% FLUSH
3.0000 mL | Freq: Two times a day (BID) | INTRAVENOUS | Status: DC
  Administered 2015-05-23 – 2015-05-24 (×2): 3 mL via INTRAVENOUS

## 2015-05-23 NOTE — Telephone Encounter (Signed)
Patient called stating that he is feeling dizzy and has slurred speech.  Patient states that he is on his way to [facility] emergency room.  Informed patient that he does not need to be driving and should call ems or have someone else take him.  Patient verbalized understanding.

## 2015-05-23 NOTE — ED Notes (Signed)
Per EMT bladder scanned showed 225 mLs.

## 2015-05-23 NOTE — ED Provider Notes (Signed)
CSN: [id number]     Arrival date & time 05/23/15  1340 History   First MD Initiated Contact with Patient 05/23/15 1546     Chief Complaint  Patient presents with  . Groin Pain     (Consider location/radiation/quality/duration/timing/severity/associated sxs/prior Treatment) HPI Comments: Patient is a 70 year old male with history of BPH who presents with dysuria and groin pain. Patient states this is rated an ongoing problem for the past 3 weeks and was seen by his primary care provider who prescribed Cipro. Patient has been taking Cipro since 4/21 and continues to have symptoms. The patient has had some associated suprapubic pain. Patient has had some episodes of nausea recently, but denies them now. The patient continues to complain of tremor, off balance, no appetite, and dizziness on standing that he was seen for 2 days ago. Patient was referred to neurology for workup for Parkinson's, but patient has not contacted neurology yet. Patient wants to be admitted to figure out what's going on. I explained to the patient that further workup of those symptoms would be done by an outpatient neurologist if his workup today made him safe to send home. Patient reports increased shortness of breath with exertion.  Patient is a 70 y.o. male presenting with groin pain. The history is provided by the patient.  Groin Pain Pertinent negatives include no abdominal pain, chest pain, chills, fever, headaches, nausea, rash, sore throat or vomiting.    Past Medical History  Diagnosis Date  . 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 BPH (benign prostatic hypertrophy) 04/15/2010  . DEPRESSION 01/22/2009  . HYPERCHOLESTEROLEMIA 01/22/2009  . 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 Morbid obesity ([place])   . Heart valve replaced   . Colon polyps   . Ulcer   . Heart murmur   . HYPERTENSION 01/22/2009    dr Casimer Lanius  . Arthritis   . Tremors of nervous system     ?ptsd   Past Surgical History  Procedure Laterality Date  .  Appendectomy    . Aortic valve replacement  July 2006    #20 stentless [place] porcine valve  . Dental surgery  05/2004    Dental extractions  . Heel spur surgery Bilateral     resection of heel spur  . Colonoscopy  04/24/2007    [name]: normal  . Tonsillectomy    . Bilateral cataract surg    . Colonoscopy with esophagogastroduodenoscopy (egd) N/A 04/05/2012    Procedure: COLONOSCOPY WITH ESOPHAGOGASTRODUODENOSCOPY (EGD);  Surgeon: Daneil Dolin, MD;  [facility];  Service: Endoscopy;  Laterality: N/A;  10;15  . Thyroidectomy  03/21/2013    DR [name]  . Thyroidectomy N/A 03/21/2013    Procedure: THYROIDECTOMY;  Surgeon: [name], MD;  Location: [place];  Service: General;  Laterality: N/A;  . Knee arthroscopy with lateral menisectomy Right 04/04/2014    Procedure: KNEE ARTHROSCOPY WITH LATERAL MENISECTOMY;  Surgeon: Carole Civil, MD;  Location: [facility];  Service: Orthopedics;  Laterality: Right;   Family History  Problem Relation Age of Onset  . Diabetes     Social History  Substance Use Topics  . Smoking status: Former Smoker -- 0.00 packs/day for 12 years    Types: Cigarettes    Quit date: 09/20/1961  . Smokeless tobacco: Never Used  . Alcohol Use: No    Review of Systems  Constitutional: Negative for fever and chills.  HENT: Negative for facial swelling and sore throat.   Respiratory: Positive for shortness of breath (on exertion).  Cardiovascular: Negative for chest pain.  Gastrointestinal: Negative for nausea, vomiting and abdominal pain.  Genitourinary: Positive for dysuria.  Musculoskeletal: Negative for back pain.  Skin: Negative for rash and wound.  Neurological: Negative for headaches.  Psychiatric/Behavioral: The patient is not nervous/anxious.       Allergies  Phenothiazines and Actos  Home Medications   Prior to Admission medications   Medication Sig Start Date End Date Taking? Authorizing Provider  acarbose (PRECOSE) 100 MG tablet  TAKE ONE TABLET BY MOUTH TWICE DAILY 11/04/14  Yes Claretta Fraise, MD  acetaminophen (TYLENOL) 500 MG tablet Take 1,000 mg by mouth every 6 (six) hours as needed (pain).   Yes Historical Provider, MD  amoxicillin (AMOXIL) 875 MG tablet Take 1 tablet (875 mg total) by mouth 2 (two) times daily. 1 po BID Patient taking differently: Take 875 mg by mouth 2 (two) times daily. 10 day course filled 05/20/15 05/20/15  Yes Sharion Balloon, FNP  aspirin EC 81 MG tablet Take 81 mg by mouth daily.   Yes Historical Provider, MD  Calcium Citrate-Vitamin D (CALCIUM + D PO) Take 1 tablet by mouth daily at 12 noon.   Yes Historical Provider, MD  citalopram (CELEXA) 20 MG tablet Take 1 tablet (20 mg total) by mouth daily. Patient taking differently: Take 10 mg by mouth daily at 12 noon.  01/03/15  Yes Milagros Loll, MD  furosemide (LASIX) 20 MG tablet Take 2 tablets (40 mg total) by mouth 2 (two) times daily. Patient taking differently: Take 20 mg by mouth daily.  01/03/15  Yes Milagros Loll, MD  glipiZIDE (GLUCOTROL) 5 MG tablet Take 1 tablet (5 mg total) by mouth 2 (two) times daily before a meal. 11/24/14  Yes Claretta Fraise, MD  insulin detemir (LEVEMIR) 100 UNIT/ML injection Inject 0.3 mLs (30 Units total) into the skin 2 (two) times daily. Patient taking differently: Inject 38 Units into the skin 2 (two) times daily.  01/03/15  Yes Milagros Loll, MD  levothyroxine (SYNTHROID, LEVOTHROID) 125 MCG tablet Take 125 mcg by mouth daily before breakfast. Take with a 200 mcg tablet for a 325 mcg dose   Yes Historical Provider, MD  levothyroxine (SYNTHROID, LEVOTHROID) 200 MCG tablet Take 1 tablet (200 mcg total) by mouth daily before breakfast. Patient taking differently: Take 200 mcg by mouth daily before breakfast. Take with a 125 mcg tablet for a 325 mcg dose 05/27/14  Yes Mary-Margaret Hassell Done, FNP  metFORMIN (GLUCOPHAGE) 1000 MG tablet Take 1 tablet (1,000 mg total) by mouth 2 (two) times daily with a meal.  03/07/14  Yes Mary-Margaret Hassell Done, FNP  metoprolol tartrate (LOPRESSOR) 25 MG tablet TAKE ONE-HALF TABLET BY MOUTH TWICE DAILY 07/02/14  Yes Claretta Fraise, MD  Multiple Vitamins-Minerals (MENS MULTI VITAMIN & MINERAL PO) Take 1 tablet by mouth daily.     Yes Historical Provider, MD  Omega-3 Fatty Acids (FISH OIL) 1200 MG CAPS Take 1,200-2,400 mg by mouth 2 (two) times daily. Take 1 capsule (1200 mg) by mouth daily at noon and 2 capsules (2400 mg) daily at bedtime   Yes Historical Provider, MD  simvastatin (ZOCOR) 80 MG tablet Take 40 mg by mouth at bedtime.  07/10/14  Yes Historical Provider, MD  tamsulosin (FLOMAX) 0.4 MG CAPS capsule Take 0.4 mg by mouth at bedtime.   Yes Historical Provider, MD  glucose blood test strip Test blood sugar bid. DX E11.9 12/02/14   Claretta Fraise, MD  levothyroxine (LEVOTHROID) 25 MCG tablet Take 1 tablet (  25 mcg total) by mouth daily before breakfast. Patient not taking: Reported on 05/23/2015 03/27/15   Eustaquio Maize, MD   BP 148/60 mmHg  Pulse 55  Temp(Src) 98 F (36.7 C) (Oral)  Resp 24  SpO2 94% Physical Exam  Constitutional: He appears well-developed and well-nourished. No distress.  HENT:  Head: Normocephalic and atraumatic.  Eyes: Conjunctivae are normal. Pupils are equal, round, and reactive to light. Right eye exhibits no discharge. Left eye exhibits no discharge. No scleral icterus.  Neck: Normal range of motion. Neck supple. No thyromegaly present.  Cardiovascular: Normal rate, regular rhythm and intact distal pulses.  Exam reveals no gallop and no friction rub.   Murmur heard. Pulmonary/Chest: Effort normal and breath sounds normal. No stridor. No respiratory distress. He has no wheezes. He has no rales.  Abdominal: Soft. Bowel sounds are normal. He exhibits no distension. There is tenderness in the suprapubic area. There is no rebound and no guarding.    Genitourinary: Right testis shows no mass, no swelling and no tenderness. Left testis shows  tenderness. Left testis shows no mass and no swelling. No discharge found.  Penis barely visible on exam; no tenderness; no perineal tenderness  Musculoskeletal: He exhibits no edema.  Lymphadenopathy:    He has no cervical adenopathy.  Neurological: He is alert. Coordination normal.  Skin: Skin is warm and dry. No rash noted. He is not diaphoretic. No pallor.  Psychiatric: He has a normal mood and affect.  Nursing note and vitals reviewed.   ED Course  Procedures (including critical care time) Labs Review Labs Reviewed  COMPREHENSIVE METABOLIC PANEL - Abnormal; Notable for the following:    Chloride 99 (*)    Glucose, Bld 180 (*)    BUN 24 (*)    Creatinine, Ser 1.64 (*)    Albumin 2.9 (*)    GFR calc non Af Amer 40 (*)    GFR calc Af Amer 47 (*)    All other components within normal limits  CBC - Abnormal; Notable for the following:    WBC 10.6 (*)    RBC 3.64 (*)    Hemoglobin 11.5 (*)    HCT 34.7 (*)    All other components within normal limits  BRAIN NATRIURETIC PEPTIDE - Abnormal; Notable for the following:    B Natriuretic Peptide 613.0 (*)    All other components within normal limits  URINALYSIS, ROUTINE W REFLEX MICROSCOPIC (NOT AT [facility])    Imaging Review Dg Chest 2 View  05/23/2015  CLINICAL DATA:  70 year old male with dizziness and weakness for the past 2 weeks. Recently diagnosed with urinary tract infection. EXAM: CHEST  2 VIEW COMPARISON:  Chest x-ray 05/15/2015. FINDINGS: Diffuse peribronchial cuffing. Patchy ill-defined opacities in the left lung base. No pleural effusions. No evidence of pulmonary edema. Heart size is borderline enlarged. Upper mediastinal contours are within normal limits. Atherosclerotic calcifications in the thoracic aorta. Status post median sternotomy. Postoperative changes in the lower cervical region likely from prior thyroidectomy. IMPRESSION: 1. The appearance the chest suggests severe bronchitis, likely with left lower lobe  bronchopneumonia. Followup PA and lateral chest X-ray is recommended in 3-4 weeks following trial of antibiotic therapy to ensure resolution and exclude underlying malignancy. 2. Atherosclerosis. Electronically Signed   By: Vinnie Langton M.D.   On: 05/23/2015 17:04   US Scrotum  05/23/2015  CLINICAL DATA:  Left testicular pain, dysuria. EXAM: ULTRASOUND OF SCROTUM TECHNIQUE: Complete ultrasound examination of the testicles, epididymis, and other scrotal  structures was performed. COMPARISON:  None. FINDINGS: Right testicle Measurements: 4.1 x 2.9 x 2.3 cm. No mass or microlithiasis visualized. Left testicle Measurements: 3.5 x 3.1 x 2.2 cm. No mass or microlithiasis visualized. Right epididymis:  6 mm cyst is noted. Left epididymis:  1.1 cm cyst is noted. Hydrocele:  None visualized. Varicocele:  None visualized. IMPRESSION: Small bilateral epididymal cysts. No evidence of testicular torsion or mass. Electronically Signed   By: Marijo Conception, M.D.   On: 05/23/2015 18:48   I have personally reviewed and evaluated these images and lab results as part of my medical decision-making.   EKG Interpretation None      MDM   Bradycardic. CBC shows WBC 10.6, which is decreased from 2 days ago; hemoglobin unchanged from recent past. CMP shows persistently elevated BUN/creatinine; albumin 2.9, chloride 99; glucose 180. BNP 613, elevated from December 2016. Patient unable to void at this time, 3 nurses unable to catheterize patient for urine sample. Will continue to try to get urine sample. CXR shows possible severe bronchitis likely with left lower lobe bronchopneumonia. It is possible this is edema versus bronchitis. Patient does have orthostatic hypotension, may be associated to other likely Parkinson's symptoms. Patient also evaluated by Dr. Laneta Simmers who is in agreement with plan. Dr. Laneta Simmers consulted medicine who would like to admit the patient for CHF exacerbation. Care transferred to medicine for further  evaluation and treatment.  Final diagnoses:  Diastolic CHF, acute on chronic [facility])  Groin pain, unspecified laterality        Frederica Kuster, PA-C 05/23/15 2017  Leo Grosser, MD 05/24/15 [id number]

## 2015-05-23 NOTE — ED Notes (Signed)
He c/o 2 week history of not feeling well, shaking, feeling off balance and having groin pain. He has been to 3 hospitals with no diagnosis. He also reports constipation. He is A&Ox4, breathing easily.

## 2015-05-23 NOTE — ED Notes (Signed)
Pt states he is unable to void. He last tried approx 15 min ago.

## 2015-05-23 NOTE — H&P (Signed)
[facility] Admission History and Physical       ARMONI GALANTE [id number] DOB: 11/11/1945 DOA: 05/23/2015  Referring physician: EDP PCP: Chevis Pretty, FNP  Specialists:   Chief Complaint: Weakness and Dizziness  HPI: [name] is a 70 y.o. male with a history of IDDM, HTN who presents to the ED with multiple complaints of SOB, Weakness, Dizziness,subjective fevers and chills, dysuria  x 2-3 weeks.  He had been seen in the ED during the past week and placed on treatment with Cipro for a UTI 1 week ago, and was seen at the ED  3 days and placed on Augmentin.   He reports also having Left Testicular pain and groin pain today.   He was seen in ED and a Chest X-ray was performed which revealed a LLL Bronchopneumonia, and his UA revealed improvement, and his BNP was found at 613.    He was administered lasix 40 mg IV x 1, and an US of the scrotum was performed to rule out torsion or acute pathology and was negative .       Review of Systems:  Constitutional: No Weight Loss, No Weight Gain, Night Sweats, Fevers, Chills, +Dizziness, Light Headedness, Fatigue,  +Generalized Weakness HEENT: No Headaches, Difficulty Swallowing,Tooth/Dental Problems,Sore Throat,  No Sneezing, Rhinitis, Ear Ache, Nasal Congestion, or Post Nasal Drip,  Cardio-vascular:  No Chest pain, Orthopnea, PND, Edema in Lower Extremities, Anasarca, Dizziness, Palpitations  Resp: No Dyspnea, No DOE, No Productive Cough, No Non-Productive Cough, No Hemoptysis, No Wheezing.    GI: No Heartburn, Indigestion, Abdominal Pain, Nausea, Vomiting, Diarrhea, Constipation, Hematemesis, Hematochezia, Melena, Change in Bowel Habits,  Loss of Appetite  GU:  +Dysuria, No Change in Color of Urine, No Urgency or Urinary Frequency, No Flank pain.  Musculoskeletal: No Joint Pain or Swelling, No Decreased Range of Motion, No Back Pain.  Neurologic: No Syncope, No Seizures, Muscle Weakness, Paresthesia, Vision Disturbance or  Loss, No Diplopia, No Vertigo, No Difficulty Walking,  Skin: No Rash or Lesions. Psych: No Change in Mood or Affect, No Depression or Anxiety, No Memory loss, No Confusion, or Hallucinations   Past Medical History  Diagnosis Date  . 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 BPH (benign prostatic hypertrophy) 04/15/2010  . DEPRESSION 01/22/2009  . HYPERCHOLESTEROLEMIA 01/22/2009  . 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 Morbid obesity ([place])   . Heart valve replaced   . Colon polyps   . Ulcer   . Heart murmur   . HYPERTENSION 01/22/2009    dr Casimer Lanius  . Arthritis   . Tremors of nervous system     ?ptsd     Past Surgical History  Procedure Laterality Date  . Appendectomy    . Aortic valve replacement  July 2006    #20 stentless [place] porcine valve  . Dental surgery  05/2004    Dental extractions  . Heel spur surgery Bilateral     resection of heel spur  . Colonoscopy  04/24/2007    [name]: normal  . Tonsillectomy    . Bilateral cataract surg    . Colonoscopy with esophagogastroduodenoscopy (egd) N/A 04/05/2012    Procedure: COLONOSCOPY WITH ESOPHAGOGASTRODUODENOSCOPY (EGD);  Surgeon: Daneil Dolin, MD;  Location: [facility];  Service: Endoscopy;  Laterality: N/A;  10;15  . Thyroidectomy  03/21/2013    DR [name]  . Thyroidectomy N/A 03/21/2013    Procedure: THYROIDECTOMY;  Surgeon: [name], MD;  Location: [facility];  Service: General;  Laterality: N/A;  . Knee arthroscopy  with lateral menisectomy Right 04/04/2014    Procedure: KNEE ARTHROSCOPY WITH LATERAL MENISECTOMY;  Surgeon: Carole Civil, MD;  Location: [facility];  Service: Orthopedics;  Laterality: Right;      Prior to Admission medications   Medication Sig Start Date End Date Taking? Authorizing Provider  acarbose (PRECOSE) 100 MG tablet TAKE ONE TABLET BY MOUTH TWICE DAILY 11/04/14  Yes Claretta Fraise, MD  citalopram (CELEXA) 20 MG tablet Take 1 tablet (20 mg total) by mouth daily. Patient taking differently: Take 10 mg  by mouth daily at 12 noon.  01/03/15  Yes Milagros Loll, MD  glipiZIDE (GLUCOTROL) 5 MG tablet Take 1 tablet (5 mg total) by mouth 2 (two) times daily before a meal. 11/24/14  Yes Claretta Fraise, MD  metFORMIN (GLUCOPHAGE) 1000 MG tablet Take 1 tablet (1,000 mg total) by mouth 2 (two) times daily with a meal. 03/07/14  Yes Mary-Margaret Hassell Done, FNP  metoprolol tartrate (LOPRESSOR) 25 MG tablet TAKE ONE-HALF TABLET BY MOUTH TWICE DAILY 07/02/14  Yes Claretta Fraise, MD  simvastatin (ZOCOR) 80 MG tablet Take 40 mg by mouth at bedtime.  07/10/14  Yes Historical Provider, MD  amoxicillin (AMOXIL) 875 MG tablet Take 1 tablet (875 mg total) by mouth 2 (two) times daily. 1 po BID 05/20/15   Sharion Balloon, FNP  aspirin EC 81 MG tablet Take 81 mg by mouth daily.    Historical Provider, MD  furosemide (LASIX) 20 MG tablet Take 2 tablets (40 mg total) by mouth 2 (two) times daily. Patient taking differently: Take 20 mg by mouth daily.  01/03/15   Milagros Loll, MD  glucose blood test strip Test blood sugar bid. DX E11.9 12/02/14   Claretta Fraise, MD  insulin detemir (LEVEMIR) 100 UNIT/ML injection Inject 0.3 mLs (30 Units total) into the skin 2 (two) times daily. Patient taking differently: Inject 38 Units into the skin 2 (two) times daily.  01/03/15   Milagros Loll, MD  levothyroxine (LEVOTHROID) 25 MCG tablet Take 1 tablet (25 mcg total) by mouth daily before breakfast. 03/27/15   Eustaquio Maize, MD  levothyroxine (SYNTHROID, LEVOTHROID) 200 MCG tablet Take 1 tablet (200 mcg total) by mouth daily before breakfast. 05/27/14   Mary-Margaret Hassell Done, FNP  Multiple Vitamins-Minerals (MENS MULTI VITAMIN & MINERAL PO) Take 1 tablet by mouth daily.      Historical Provider, MD     Allergies  Allergen Reactions  . Phenothiazines Anaphylaxis  . Actos [Pioglitazone] Swelling    Social History:  reports that he quit smoking about 53 years ago. His smoking use included Cigarettes. He smoked 0.00 packs per day for 12  years. He has never used smokeless tobacco. He reports that he does not drink alcohol or use illicit drugs.    Family History  Problem Relation Age of Onset  . Diabetes         Physical Exam:  GEN:  Pleasant Obese 70 y.o. Caucasian male examined and in no acute distress; cooperative with exam Filed Vitals:   05/23/15 1600 05/23/15 1615 05/23/15 1630 05/23/15 1712  BP: 147/58 138/54 144/54 148/60  Pulse: 50 51 50 55  Temp:    98 F (36.7 C)  TempSrc:    Oral  Resp:    24  SpO2: 95% 95% 94% 94%   Blood pressure 148/60, pulse 55, temperature 98 F (36.7 C), temperature source Oral, resp. rate 24, SpO2 94 %. PSYCH: He is alert and oriented x4; does not appear anxious does  not appear depressed; affect is normal HEENT: Normocephalic and Atraumatic, Mucous membranes pink; PERRLA; EOM intact; Fundi:  Benign;  No scleral icterus, Nares: Patent, Oropharynx: Clear,  Fair Dentition,    Neck:  FROM, No Cervical Lymphadenopathy nor Thyromegaly or Carotid Bruit; No JVD; Breasts:: Not examined CHEST WALL: No tenderness CHEST: Normal respiration, clear to auscultation bilaterally HEART: Regular rate and rhythm; no murmurs rubs or gallops BACK: No kyphosis or scoliosis; No CVA tenderness ABDOMEN: Positive Bowel Sounds, Obese, Soft Non-Tender, No Rebound or Guarding; No Masses, No Organomegaly, No Pannus; No Intertriginous candida. Rectal Exam: Not done EXTREMITIES: No Cyanosis, Clubbing, or Edema; No Ulcerations. Genitalia: not examined PULSES: 2+ and symmetric SKIN: Normal hydration no rash or ulceration CNS:  Alert and Oriented x 4, No Focal Deficits Vascular: pulses palpable throughout    Labs on Admission:  Basic Metabolic Panel:  Recent Labs Lab 05/21/15 1510 05/23/15 1409  NA 134* 136  K 4.5 4.4  CL 98* 99*  CO2 23 24  GLUCOSE 194* 180*  BUN 24* 24*  CREATININE 1.75* 1.64*  CALCIUM 9.0 9.0   Liver Function Tests:  Recent Labs Lab 05/23/15 1409  AST 29  ALT 23    ALKPHOS 70  BILITOT 1.2  PROT 7.3  ALBUMIN 2.9*   No results for input(s): LIPASE, AMYLASE in the last 168 hours. No results for input(s): AMMONIA in the last 168 hours. CBC:  Recent Labs Lab 05/21/15 1510 05/23/15 1409  WBC 15.6* 10.6*  NEUTROABS 12.6*  --   HGB 12.0* 11.5*  HCT 35.1* 34.7*  MCV 93.4 95.3  PLT 175 201   Cardiac Enzymes: No results for input(s): CKTOTAL, CKMB, CKMBINDEX, TROPONINI in the last 168 hours.  BNP (last 3 results)  Recent Labs  01/01/15 1723 01/09/15 0923 05/23/15 1409  BNP 185.9* 94.8 613.0*    ProBNP (last 3 results) No results for input(s): PROBNP in the last 8760 hours.  CBG: No results for input(s): GLUCAP in the last 168 hours.  Radiological Exams on Admission: Dg Chest 2 View  05/23/2015  CLINICAL DATA:  70 year old male with dizziness and weakness for the past 2 weeks. Recently diagnosed with urinary tract infection. EXAM: CHEST  2 VIEW COMPARISON:  Chest x-ray 05/15/2015. FINDINGS: Diffuse peribronchial cuffing. Patchy ill-defined opacities in the left lung base. No pleural effusions. No evidence of pulmonary edema. Heart size is borderline enlarged. Upper mediastinal contours are within normal limits. Atherosclerotic calcifications in the thoracic aorta. Status post median sternotomy. Postoperative changes in the lower cervical region likely from prior thyroidectomy. IMPRESSION: 1. The appearance the chest suggests severe bronchitis, likely with left lower lobe bronchopneumonia. Followup PA and lateral chest X-ray is recommended in 3-4 weeks following trial of antibiotic therapy to ensure resolution and exclude underlying malignancy. 2. Atherosclerosis. Electronically Signed   By: Vinnie Langton M.D.   On: 05/23/2015 17:04   US Scrotum  05/23/2015  CLINICAL DATA:  Left testicular pain, dysuria. EXAM: ULTRASOUND OF SCROTUM TECHNIQUE: Complete ultrasound examination of the testicles, epididymis, and other scrotal structures was  performed. COMPARISON:  None. FINDINGS: Right testicle Measurements: 4.1 x 2.9 x 2.3 cm. No mass or microlithiasis visualized. Left testicle Measurements: 3.5 x 3.1 x 2.2 cm. No mass or microlithiasis visualized. Right epididymis:  6 mm cyst is noted. Left epididymis:  1.1 cm cyst is noted. Hydrocele:  None visualized. Varicocele:  None visualized. IMPRESSION: Small bilateral epididymal cysts. No evidence of testicular torsion or mass. Electronically Signed   By: Jeneen Rinks  Murlean Caller, M.D.   On: 05/23/2015 18:48     EKG: Independently reviewed.      Assessment/Plan:   70 y.o. male with  Active Problems:    Diastolic CHF, acute on chronic (HCC)   CHF Protocol with IV Lasix         CAP (community acquired pneumonia)   CAP Protocol   IV Levaquin      UTI (lower urinary tract infection)   Urine C+S ordered     Hyperlipidemia with target LDL less than 100           Essential hypertension   Continue Metoprolol      Hypothyroidism   Levothyroxine    IDDM   Continue Levemir   SSI coverage PRN    DVT Prophylaxis   Lovenox    Code Status:     FULL CODE   Family Communication: No Family Present    Disposition Plan:    Inpatient Status        Time spent: [place] [facility] Pager [phone]   If [place] Please Contact the Day Rounding Team MD for [facility]  If 7PM-7AM, Please Contact Night-Floor Coverage  [email or web address] Password [email or web address] 05/23/2015, 8:02 PM     ADDENDUM:   Patient was seen and examined on 05/23/2015

## 2015-05-24 ENCOUNTER — Encounter (HOSPITAL_COMMUNITY): Payer: Self-pay | Admitting: *Deleted

## 2015-05-24 DIAGNOSIS — J189 Pneumonia, unspecified organism: Secondary | ICD-10-CM

## 2015-05-24 DIAGNOSIS — N390 Urinary tract infection, site not specified: Secondary | ICD-10-CM

## 2015-05-24 DIAGNOSIS — I5033 Acute on chronic diastolic (congestive) heart failure: Secondary | ICD-10-CM

## 2015-05-24 DIAGNOSIS — I10 Essential (primary) hypertension: Secondary | ICD-10-CM

## 2015-05-24 DIAGNOSIS — R1030 Lower abdominal pain, unspecified: Secondary | ICD-10-CM

## 2015-05-24 LAB — BASIC METABOLIC PANEL
Anion gap: 7 (ref 5–15)
BUN: 28 mg/dL — ABNORMAL HIGH (ref 6–20)
CHLORIDE: 100 mmol/L — AB (ref 101–111)
CO2: 27 mmol/L (ref 22–32)
Calcium: 8.2 mg/dL — ABNORMAL LOW (ref 8.9–10.3)
Creatinine, Ser: 1.56 mg/dL — ABNORMAL HIGH (ref 0.61–1.24)
GFR, EST AFRICAN AMERICAN: 50 mL/min — AB (ref >60)
GFR, EST NON AFRICAN AMERICAN: 43 mL/min — AB (ref >60)
Glucose, Bld: 229 mg/dL — ABNORMAL HIGH (ref 65–99)
POTASSIUM: 4 mmol/L (ref 3.5–5.1)
Sodium: 134 mmol/L — ABNORMAL LOW (ref 135–145)

## 2015-05-24 LAB — CBC
HEMATOCRIT: 31.1 % — AB (ref 39.0–52.0)
Hemoglobin: 10.5 g/dL — ABNORMAL LOW (ref 13.0–17.0)
MCH: 31.7 pg (ref 26.0–34.0)
MCHC: 33.8 g/dL (ref 30.0–36.0)
MCV: 94 fL (ref 78.0–100.0)
PLATELETS: 178 10*3/uL (ref 150–400)
RBC: 3.31 MIL/uL — AB (ref 4.22–5.81)
RDW: 14.8 % (ref 11.5–15.5)
WBC: 7.3 10*3/uL (ref 4.0–10.5)

## 2015-05-24 LAB — GLUCOSE, CAPILLARY
GLUCOSE-CAPILLARY: 155 mg/dL — AB (ref 65–99)
GLUCOSE-CAPILLARY: 97 mg/dL (ref 65–99)
Glucose-Capillary: 183 mg/dL — ABNORMAL HIGH (ref 65–99)
Glucose-Capillary: 220 mg/dL — ABNORMAL HIGH (ref 65–99)

## 2015-05-24 MED ORDER — FUROSEMIDE 40 MG PO TABS
40.0000 mg | ORAL_TABLET | Freq: Two times a day (BID) | ORAL | Status: DC
  Administered 2015-05-25: 40 mg via ORAL
  Filled 2015-05-24: qty 1

## 2015-05-24 NOTE — Evaluation (Signed)
Physical Therapy Evaluation Patient Details Name: [name] MRN: [id number] DOB: August 24, 1945 Today's Date: 05/24/2015   History of Present Illness  Pt is a 70 y/o M who presented w/ c/o SOB, weakness, dizziness.  ED workup significant for UTI and bronchopneumonia.  Pt's PMH includes depression, morbid obesity, tremors, Rt knee arthroscopy w/ lateral menisectomy.    Clinical Impression  Pt admitted with above diagnosis. Pt currently with functional limitations due to the deficits listed below (see PT Problem List). Limited evaluation as pt c/o dizziness while ambulating (BP 142/39 and HR in 50s-60s).  Encouraged pt to contact brother and/or neighbor to have someone stay w/ him temporarily at d/c.  He currently requires supervision>min guard assist for safe transfers and ambulation and lives alone. Pt will benefit from skilled PT to increase their independence and safety with mobility to allow discharge to the venue listed below.      Follow Up Recommendations Outpatient PT;Supervision for mobility/OOB    Equipment Recommendations  None recommended by PT    Recommendations for Other Services       Precautions / Restrictions Precautions Precautions: Fall Precaution Comments: watch low HR Restrictions Weight Bearing Restrictions: No      Mobility  Bed Mobility               General bed mobility comments: Pt sitting in recliner chair upon PT arrival  Transfers Overall transfer level: Needs assistance Equipment used: None Transfers: Sit to/from Stand Sit to Stand: Supervision         General transfer comment: Supervision for safety  Ambulation/Gait Ambulation/Gait assistance: Min guard Ambulation Distance (Feet): 80 Feet Assistive device: None Gait Pattern/deviations: Step-through pattern;Drifts right/left;Decreased stride length   Gait velocity interpretation: Below normal speed for age/gender General Gait Details: Initially at supervision level but then pt  begins drifting Lt and Rt and c/o dizziness.  Pt sat in chair nearby and BP 142/39.  Required several minutes of sitting before dizziness cleared.  Of note, HR in the 50s and 60s while ambulating  Stairs            Wheelchair Mobility    Modified Rankin (Stroke Patients Only)       Balance Overall balance assessment: Needs assistance;History of Falls Sitting-balance support: No upper extremity supported;Feet supported Sitting balance-Leahy Scale: Good     Standing balance support: No upper extremity supported;During functional activity Standing balance-Leahy Scale: Fair                               Pertinent Vitals/Pain Pain Assessment: No/denies pain    Home Living Family/patient expects to be discharged to:: Private residence Living Arrangements: Alone Available Help at Discharge: Neighbor;Family (his brother or neighbor might be able to stay w/ him at d/c) Type of Home: Mobile home Home Access: Stairs to enter Entrance Stairs-Rails: Can reach both;Left;Right Entrance Stairs-Number of Steps: 4 Home Layout: One level Home Equipment: Walker - 2 wheels      Prior Function Level of Independence: Independent         Comments: Retired Administrator for 40 years.  Said he fell ~2 wks ago, unclear reason.     Hand Dominance        Extremity/Trunk Assessment   Upper Extremity Assessment: Overall WFL for tasks assessed           Lower Extremity Assessment: Overall WFL for tasks assessed  Communication   Communication: HOH  Cognition Arousal/Alertness: Awake/alert Behavior During Therapy: WFL for tasks assessed/performed Overall Cognitive Status: No family/caregiver present to determine baseline cognitive functioning Area of Impairment: Safety/judgement;Problem solving     Memory: Decreased short-term memory   Safety/Judgement: Decreased awareness of safety   Problem Solving: Slow processing General Comments: Pt asked  multiple time for 3 extra pairs of socks so he could wear them around his home.      General Comments General comments (skin integrity, edema, etc.): Pt encouraged to call brother and/or neighbor to arrange someone to stay w/ him temporarily upon d/c, pt verbalized understanding.    Exercises        Assessment/Plan    PT Assessment Patient needs continued PT services  PT Diagnosis Difficulty walking   PT Problem List Decreased activity tolerance;Decreased balance;Decreased safety awareness;Cardiopulmonary status limiting activity;Obesity  PT Treatment Interventions DME instruction;Gait training;Stair training;Functional mobility training;Therapeutic activities;Therapeutic exercise;Balance training;Cognitive remediation;Patient/family education   PT Goals (Current goals can be found in the Care Plan section) Acute Rehab PT Goals Patient Stated Goal: none stated PT Goal Formulation: With patient Time For Goal Achievement: 06/07/15 Potential to Achieve Goals: Good    Frequency Min 3X/week   Barriers to discharge Inaccessible home environment;Decreased caregiver support lives alone w/ several steps to enter home    Co-evaluation               End of Session Equipment Utilized During Treatment: Gait belt Activity Tolerance: Treatment limited secondary to medical complications (Comment) (dizziness) Patient left: in chair;with call bell/phone within reach;with chair alarm set Nurse Communication: Mobility status         Time: [id number] PT Time Calculation (min) (ACUTE ONLY): 22 min   Charges:   PT Evaluation $PT Eval Low Complexity: 1 Procedure     PT G Codes:       Collie Siad PT, DPT  Pager: [phone] Phone: [phone] 05/24/2015, 4:55 PM

## 2015-05-24 NOTE — Progress Notes (Signed)
PROGRESS NOTE                                                                                                                                                                                                             Patient Demographics:    [name], is a 70 y.o. male, DOB - 01-25-1946, [id number]  Admit date - 05/23/2015   Admitting Physician Theressa Millard, MD  Outpatient Primary MD for the patient is Chevis Pretty, [place]  LOS - 1  Outpatient Specialists  Chief Complaint  Patient presents with  . Groin Pain       Brief Narrative   70 y.o. male with a history of IDDM, HTN who presents to the ED with multiple complaints of SOB, Weakness, Dizziness,subjective fevers and chills, dysuria x 2-3 weeks, Treated with Cipro initially, and Augmentin as an outpatient, in ED workup significant for UTI and bronchopneumonia.  Subjective:    [name] today has, No headache, No chest pain, A shunt reported groin pain- No Nausea, poor generalized weakness, and tremors(chronically)  Assessment  & Plan :    Active Problems:   Hyperlipidemia with target LDL less than 100   Essential hypertension   Diastolic CHF, acute on chronic (HCC)   Hypothyroidism   CAP (community acquired pneumonia)   UTI (lower urinary tract infection)   Groin pain  UTI with groin pain - Ultrasound scrotum with no significant finding - Recent complains more of pelvic pain and actual groin pain, consider prone prostatitis as a diagnosis - Continue IV levofloxacin, follow on urine cultures  CAP - Continue with IV levofloxacin - Follow on blood cultures, follow sputum cultures  Generalized weakness/tremors - Patient physical exam consistent with essential tremors, already on beta blockers - We'll consult PT - Generalized weakness most likely related to infectious process  Diastolic CHF - Currently appears to be euvolemic , IV Lasix to home dose by mouth  Lasix  Diabetes mellitus - January Levemir and insulin sliding scale  Hypothyroidism - Continue with Synthroid  Hypertension  - Continue with metoprolol    Code Status : Full  Family Communication  : none at bedside  Disposition Plan  : pending PT  Barriers For Discharge :   Consults  :  none  Procedures  : none  DVT Prophylaxis  :  Lovenox  Lab Results  Component Value Date   PLT 178 05/24/2015    Antibiotics  :    Anti-infectives    Start     Dose/Rate Route Frequency Ordered Stop   05/23/15 2200  levofloxacin (LEVAQUIN) IVPB 750 mg     750 mg 100 mL/hr over 90 Minutes Intravenous Every 24 hours 05/23/15 2128 05/28/15 2159        Objective:   Filed Vitals:   05/23/15 2030 05/23/15 2121 05/24/15 0601 05/24/15 1144  BP: 136/51 131/47 131/54 113/55  Pulse: 55 60 46 46  Temp:  97.5 F (36.4 C) 97.8 F (36.6 C) 98 F (36.7 C)  TempSrc:  Oral Oral Oral  Resp: 22 20 20 18   Height:  6\' 1"  (1.854 m)    Weight:  123.469 kg (272 lb 3.2 oz) 123.152 kg (271 lb 8 oz)   SpO2: 82% 97% 96% 93%    Wt Readings from Last 3 Encounters:  05/24/15 123.152 kg (271 lb 8 oz)  05/21/15 130.182 kg (287 lb)  05/15/15 128.822 kg (284 lb)     Intake/Output Summary (Last 24 hours) at 05/24/15 1157 Last data filed at 05/24/15 0900  Gross per 24 hour  Intake    220 ml  Output      0 ml  Net    220 ml     Physical Exam  Awake Alert, Oriented X 3,  [place].AT,PERRAL Supple Neck,No JVD, No cervical lymphadenopathy appriciated.  Symmetrical Chest wall movement, Good air movement bilaterally, CTAB RRR,No Gallops,Rubs or new Murmurs, No Parasternal Heave +ve B.Sounds, Abd Soft, Minimal tenderness in suprapubic area, No organomegaly appriciated, No rebound - guarding or rigidity. No Cyanosis, Clubbing or edema, No new Rash or bruise     Data Review:    CBC  Recent Labs Lab 05/21/15 1510 05/23/15 1409 05/24/15 0405  WBC 15.6* 10.6* 7.3  HGB 12.0* 11.5* 10.5*  HCT  35.1* 34.7* 31.1*  PLT 175 201 178  MCV 93.4 95.3 94.0  MCH 31.9 31.6 31.7  MCHC 34.2 33.1 33.8  RDW 15.1 14.8 14.8  LYMPHSABS 1.5  --   --   MONOABS 1.4*  --   --   EOSABS 0.0  --   --   BASOSABS 0.0  --   --     Chemistries   Recent Labs Lab 05/21/15 1510 05/23/15 1409 05/24/15 0405  NA 134* 136 134*  K 4.5 4.4 4.0  CL 98* 99* 100*  CO2 23 24 27   GLUCOSE 194* 180* 229*  BUN 24* 24* 28*  CREATININE 1.75* 1.64* 1.56*  CALCIUM 9.0 9.0 8.2*  AST  --  29  --   ALT  --  23  --   ALKPHOS  --  70  --   BILITOT  --  1.2  --    ------------------------------------------------------------------------------------------------------------------ No results for input(s): CHOL, HDL, LDLCALC, TRIG, CHOLHDL, LDLDIRECT in the last 72 hours.  Lab Results  Component Value Date   HGBA1C 7.3 03/25/2015   ------------------------------------------------------------------------------------------------------------------ No results for input(s): TSH, T4TOTAL, T3FREE, THYROIDAB in the last 72 hours.  Invalid input(s): FREET3 ------------------------------------------------------------------------------------------------------------------ No results for input(s): VITAMINB12, FOLATE, FERRITIN, TIBC, IRON, RETICCTPCT in the last 72 hours.  Coagulation profile No results for input(s): INR, PROTIME in the last 168 hours.  No results for input(s): DDIMER in the last 72 hours.  Cardiac Enzymes No results for input(s): CKMB, TROPONINI, MYOGLOBIN in the last 168 hours.  Invalid input(s): CK ------------------------------------------------------------------------------------------------------------------    Component  Value Date/Time   BNP 613.0* 05/23/2015 1409   BNP 94.8 01/09/2015 0923    Inpatient Medications  Scheduled Meds: . aspirin EC  81 mg Oral Daily  . atorvastatin  40 mg Oral q1800  . calcium-vitamin D  1 tablet Oral Q breakfast  . citalopram  10 mg Oral Q1200  . enoxaparin  (LOVENOX) injection  40 mg Subcutaneous Daily  . furosemide  40 mg Intravenous Q12H  . insulin aspart  0-5 Units Subcutaneous QHS  . insulin aspart  0-9 Units Subcutaneous TID WC  . insulin detemir  38 Units Subcutaneous BID  . levofloxacin (LEVAQUIN) IV  750 mg Intravenous Q24H  . levothyroxine  200 mcg Oral QAC breakfast  . metoprolol tartrate  12.5 mg Oral BID  . omega-3 acid ethyl esters  1 g Oral Daily  . potassium chloride  10 mEq Oral BID  . sodium chloride flush  3 mL Intravenous Q12H  . sodium chloride flush  3 mL Intravenous Q12H  . sodium chloride flush  3 mL Intravenous Q12H  . tamsulosin  0.4 mg Oral QHS   Continuous Infusions:  PRN Meds:.sodium chloride, sodium chloride, acetaminophen **OR** acetaminophen, ondansetron **OR** ondansetron (ZOFRAN) IV, oxyCODONE, sodium chloride flush, sodium chloride flush  Micro Results Recent Results (from the past 240 hour(s))  Microscopic Examination     Status: Abnormal   Collection Time: 05/15/15 11:35 AM  Result Value Ref Range Status   WBC, UA >30 (A) 0 -  5 /hpf Final   RBC, UA 3-10 (A) 0 -  2 /hpf Final   Epithelial Cells (non renal) 0-10 0 - 10 /hpf Final   Renal Epithel, UA 0-10 (A) None seen /hpf Final   Bacteria, UA Moderate (A) None seen/Few Final  Urine culture     Status: Abnormal   Collection Time: 05/15/15 12:12 PM  Result Value Ref Range Status   Urine Culture, Routine Final report (A)  Final   Urine Culture result 1 Comment (A)  Final    Comment: Beta hemolytic Streptococcus, group B Greater than 100,000 colony forming units per mL Penicillin and ampicillin are drugs of choice for treatment of beta-hemolytic streptococcal infections. Susceptibility testing of penicillins and other beta-lactam agents approved by the FDA for treatment of beta-hemolytic streptococcal infections need not be performed routinely because nonsusceptible isolates are extremely rare in any beta-hemolytic streptococcus and have not been  reported for Streptococcus pyogenes (group A). (CLSI 2011)     Radiology Reports Dg Chest 2 View  05/23/2015  CLINICAL DATA:  70 year old male with dizziness and weakness for the past 2 weeks. Recently diagnosed with urinary tract infection. EXAM: CHEST  2 VIEW COMPARISON:  Chest x-ray 05/15/2015. FINDINGS: Diffuse peribronchial cuffing. Patchy ill-defined opacities in the left lung base. No pleural effusions. No evidence of pulmonary edema. Heart size is borderline enlarged. Upper mediastinal contours are within normal limits. Atherosclerotic calcifications in the thoracic aorta. Status post median sternotomy. Postoperative changes in the lower cervical region likely from prior thyroidectomy. IMPRESSION: 1. The appearance the chest suggests severe bronchitis, likely with left lower lobe bronchopneumonia. Followup PA and lateral chest X-ray is recommended in 3-4 weeks following trial of antibiotic therapy to ensure resolution and exclude underlying malignancy. 2. Atherosclerosis. Electronically Signed   By: Vinnie Langton M.D.   On: 05/23/2015 17:04   US Scrotum  05/23/2015  CLINICAL DATA:  Left testicular pain, dysuria. EXAM: ULTRASOUND OF SCROTUM TECHNIQUE: Complete ultrasound examination of the testicles, epididymis, and other scrotal structures  was performed. COMPARISON:  None. FINDINGS: Right testicle Measurements: 4.1 x 2.9 x 2.3 cm. No mass or microlithiasis visualized. Left testicle Measurements: 3.5 x 3.1 x 2.2 cm. No mass or microlithiasis visualized. Right epididymis:  6 mm cyst is noted. Left epididymis:  1.1 cm cyst is noted. Hydrocele:  None visualized. Varicocele:  None visualized. IMPRESSION: Small bilateral epididymal cysts. No evidence of testicular torsion or mass. Electronically Signed   By: Marijo Conception, M.D.   On: 05/23/2015 18:48    Time Spent in minutes  25 minutes   Jadon Harbaugh M.D on 05/24/2015 at 11:57 AM  Between 7am to 7pm - Pager - [phone]  After 7pm  go to [email or web address] - password [facility]  [facility] -  Office  [phone]

## 2015-05-24 NOTE — Progress Notes (Signed)
Utilization review completed.  

## 2015-05-25 ENCOUNTER — Inpatient Hospital Stay (HOSPITAL_COMMUNITY): Payer: Medicare Other

## 2015-05-25 ENCOUNTER — Inpatient Hospital Stay: Admission: RE | Admit: 2015-05-25 | Payer: Medicare Other | Source: Ambulatory Visit

## 2015-05-25 ENCOUNTER — Other Ambulatory Visit: Payer: Medicare Other

## 2015-05-25 ENCOUNTER — Telehealth: Payer: Self-pay | Admitting: Nurse Practitioner

## 2015-05-25 ENCOUNTER — Other Ambulatory Visit: Payer: Self-pay

## 2015-05-25 DIAGNOSIS — N410 Acute prostatitis: Secondary | ICD-10-CM

## 2015-05-25 DIAGNOSIS — E039 Hypothyroidism, unspecified: Secondary | ICD-10-CM

## 2015-05-25 DIAGNOSIS — I509 Heart failure, unspecified: Secondary | ICD-10-CM

## 2015-05-25 LAB — BASIC METABOLIC PANEL
Anion gap: 10 (ref 5–15)
BUN: 26 mg/dL — ABNORMAL HIGH (ref 6–20)
CHLORIDE: 102 mmol/L (ref 101–111)
CO2: 27 mmol/L (ref 22–32)
CREATININE: 1.49 mg/dL — AB (ref 0.61–1.24)
Calcium: 8.4 mg/dL — ABNORMAL LOW (ref 8.9–10.3)
GFR calc non Af Amer: 45 mL/min — ABNORMAL LOW (ref >60)
GFR, EST AFRICAN AMERICAN: 53 mL/min — AB (ref >60)
Glucose, Bld: 111 mg/dL — ABNORMAL HIGH (ref 65–99)
POTASSIUM: 3.9 mmol/L (ref 3.5–5.1)
Sodium: 139 mmol/L (ref 135–145)

## 2015-05-25 LAB — CBC
HEMATOCRIT: 30.5 % — AB (ref 39.0–52.0)
HEMOGLOBIN: 10.3 g/dL — AB (ref 13.0–17.0)
MCH: 31.9 pg (ref 26.0–34.0)
MCHC: 33.8 g/dL (ref 30.0–36.0)
MCV: 94.4 fL (ref 78.0–100.0)
Platelets: 185 10*3/uL (ref 150–400)
RBC: 3.23 MIL/uL — ABNORMAL LOW (ref 4.22–5.81)
RDW: 14.8 % (ref 11.5–15.5)
WBC: 6.8 10*3/uL (ref 4.0–10.5)

## 2015-05-25 LAB — URINE CULTURE

## 2015-05-25 LAB — GLUCOSE, CAPILLARY
GLUCOSE-CAPILLARY: 69 mg/dL (ref 65–99)
GLUCOSE-CAPILLARY: 135 mg/dL — AB (ref 65–99)

## 2015-05-25 LAB — ECHOCARDIOGRAM COMPLETE
Height: 73 in
Weight: 4360 oz

## 2015-05-25 MED ORDER — SACCHAROMYCES BOULARDII 250 MG PO CAPS: 250.0000 mg | ORAL_CAPSULE | Freq: Two times a day (BID) | ORAL | Status: DC

## 2015-05-25 MED ORDER — LEVOFLOXACIN 500 MG PO TABS: 500.0000 mg | ORAL_TABLET | Freq: Every day | ORAL | Status: DC

## 2015-05-25 MED ORDER — LEVOFLOXACIN 750 MG PO TABS: 750.0000 mg | ORAL_TABLET | Freq: Every day | ORAL | Status: DC

## 2015-05-25 NOTE — Telephone Encounter (Signed)
Appointment scheduled for May 9th with Evelina Dun, FNP for hospital follow up.

## 2015-05-25 NOTE — Progress Notes (Signed)
*  PRELIMINARY RESULTS* Echocardiogram 2D Echocardiogram has been performed.  Leavy Cella 05/25/2015, 12:27 PM

## 2015-05-25 NOTE — Consult Note (Signed)
   [facility] CM Inpatient Consult   05/25/2015  [name] Dec 09, 1945 [id number] Patient evaluated for community based chronic disease management services with [facility] as a benefit of patient's Medicare Insurance. Spoke with patient at bedside to explain [name] services.Patient is a 70 y.o. male with a history of IDDM, HTN with  multiple complaints of SOB, Weakness, Dizziness.  Admitted with HF.  Patient states he does not weigh himself.  He gets his medications from the [facility].  Patient has had 2 hospitalizations with 2ED visits in the past 6 months.   Patient's primary care provider is [name] Hassell Done, NP.  Consent form signed.   Patient will receive post hospital discharge call and will be evaluated for monthly home visits for assessments and disease process education.  Left contact information and [facility] literature at bedside. Made Inpatient Case Manager aware that [facility] following. Of note, [facility] Care Management services does not replace or interfere with any services that are arranged by inpatient case management or social work.  For additional questions or referrals please contact:    Natividad Brood, RN BSN [facility] Liaison  [phone] business mobile phone Toll free office [phone]

## 2015-05-25 NOTE — Discharge Instructions (Signed)
Follow with Primary MD Chevis Pretty, FNP in 7 days   Get CBC, CMP, UA, Urine culture , checked  by Primary MD next visit.    Activity: As tolerated with Full fall precautions use walker/cane & assistance as needed   Disposition Home    Diet: Heart Healthy  , with feeding assistance and aspiration precautions.  For Heart failure patients - Check your Weight same time everyday, if you gain over 2 pounds, or you develop in leg swelling, experience more shortness of breath or chest pain, call your Primary MD immediately. Follow Cardiac Low Salt Diet and 1.5 lit/day fluid restriction.   On your next visit with your primary care physician please Get Medicines reviewed and adjusted.   Please request your Prim.MD to go over all Hospital Tests and Procedure/Radiological results at the follow up, please get all Hospital records sent to your Prim MD by signing hospital release before you go home.   If you experience worsening of your admission symptoms, develop shortness of breath, life threatening emergency, suicidal or homicidal thoughts you must seek medical attention immediately by calling 911 or calling your MD immediately  if symptoms less severe.  You Must read complete instructions/literature along with all the possible adverse reactions/side effects for all the Medicines you take and that have been prescribed to you. Take any new Medicines after you have completely understood and accpet all the possible adverse reactions/side effects.   Do not drive, operating heavy machinery, perform activities at heights, swimming or participation in water activities or provide baby sitting services if your were admitted for syncope or siezures until you have seen by Primary MD or a Neurologist and advised to do so again.  Do not drive when taking Pain medications.    Do not take more than prescribed Pain, Sleep and Anxiety Medications  Special Instructions: If you have smoked or chewed  Tobacco  in the last 2 yrs please stop smoking, stop any regular Alcohol  and or any Recreational drug use.  Wear Seat belts while driving.   Please note  You were cared for by a hospitalist during your hospital stay. If you have any questions about your discharge medications or the care you received while you were in the hospital after you are discharged, you can call the unit and asked to speak with the hospitalist on call if the hospitalist that took care of you is not available. Once you are discharged, your primary care physician will handle any further medical issues. Please note that NO REFILLS for any discharge medications will be authorized once you are discharged, as it is imperative that you return to your primary care physician (or establish a relationship with a primary care physician if you do not have one) for your aftercare needs so that they can reassess your need for medications and monitor your lab values.

## 2015-05-25 NOTE — Discharge Summary (Signed)
[name], is a 70 y.o. male  DOB May 07, 1945  MRN [id number].  Admission date:  05/23/2015  Admitting Physician  Theressa Millard, MD  Discharge Date:  05/25/2015   Primary MD  Chevis Pretty, FNP  Recommendations for primary care physician for things to follow:  - Please check CBC, BMP, urinalysis, urine culture during next visit - To finish another 16 days of by mouth levofloxacin, total of 28 days of antibiotics for acute bacterial prostatitis.   Admission Diagnosis  Diastolic CHF, acute on chronic (HCC) [I50.33] Groin pain, unspecified laterality [R10.30]   Discharge Diagnosis  Diastolic CHF, acute on chronic (HCC) [I50.33] Groin pain, unspecified laterality [R10.30]    Active Problems:   Hyperlipidemia with target LDL less than 100   Essential hypertension   Diastolic CHF, acute on chronic (HCC)   Hypothyroidism   CAP (community acquired pneumonia)   UTI (lower urinary tract infection)   Groin pain      Past Medical History  Diagnosis Date  . 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 BPH (benign prostatic hypertrophy) 04/15/2010  . DEPRESSION 01/22/2009  . HYPERCHOLESTEROLEMIA 01/22/2009  . 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 Morbid obesity ([place])   . Heart valve replaced   . Colon polyps   . Ulcer   . Heart murmur   . HYPERTENSION 01/22/2009    dr Casimer Lanius  . Arthritis   . Tremors of nervous system     ?ptsd    Past Surgical History  Procedure Laterality Date  . Appendectomy    . Aortic valve replacement  July 2006    #20 stentless [place] porcine valve  . Dental surgery  05/2004    Dental extractions  . Heel spur surgery Bilateral     resection of heel spur  . Colonoscopy  04/24/2007    [name]: normal  . Tonsillectomy    . Bilateral cataract surg    . Colonoscopy with esophagogastroduodenoscopy (egd) N/A 04/05/2012    Procedure: COLONOSCOPY WITH  ESOPHAGOGASTRODUODENOSCOPY (EGD);  Surgeon: Daneil Dolin, MD;  [facility];  Service: Endoscopy;  Laterality: N/A;  10;15  . Thyroidectomy  03/21/2013    DR [name]  . Thyroidectomy N/A 03/21/2013    Procedure: THYROIDECTOMY;  Surgeon: [name], MD;  Location: [facility];  Service: General;  Laterality: N/A;  . Knee arthroscopy with lateral menisectomy Right 04/04/2014    Procedure: KNEE ARTHROSCOPY WITH LATERAL MENISECTOMY;  Surgeon: Carole Civil, MD;  Location: [facility];  Service: Orthopedics;  Laterality: Right;       History of present illness and  Hospital Course:     Kindly see H&P for history of present illness and admission details, please review complete Labs, Consult reports and Test reports for all details in brief  HPI  from the history and physical done on the day of admission 05/23/2015 HPI: [name] is a 70 y.o. male with a history of IDDM, HTN who presents to the ED with multiple complaints of SOB, Weakness, Dizziness,subjective fevers and  chills, dysuria x 2-3 weeks. He had been seen in the ED during the past week and placed on treatment with Cipro for a UTI 1 week ago, and was seen at the ED 3 days and placed on Augmentin. He reports also having Left Testicular pain and groin pain today. He was seen in ED and a Chest X-ray was performed which revealed a LLL Bronchopneumonia, and his UA revealed improvement, and his BNP was found at 613.  He was administered lasix 40 mg IV x 1, and an US of the scrotum was performed to rule out torsion or acute pathology and was negative .    Hospital Course  70 y.o. male with a history of IDDM, HTN who presents to the ED with multiple complaints of SOB, Weakness, Dizziness,subjective fevers and chills, dysuria x 2-3 weeks, Treated with Cipro initially, and Augmentin as an outpatient, in ED workup significant for UTI and bronchopneumonia, patient reports cough, nonproductive, and endorses groin pain,  ultrasound scrotum with no acute findings, symptoms most likely related to acute prostatitis given edematous and tender prostate on physical exam.  Acute bacterial prostatitis - Patient presents with dysuria and groin pain , positive urinalysis, initially thought to be secondary to UTI , but patient with edematous and tender prostate on physical exam, will be treated for acute bacterial prostatitis, C5 2 days of IV levofloxacin as an inpatient, to finish another 16 days of by mouth levofloxacin as an outpatient , she will be total of 28 days for treatment . - Urine culture growing multiple species, not helpful - Ultrasound scrotum with no significant finding   CAP - Should be covered with levofloxacin - Follow on blood cultures, follow sputum cultures with no growth to date at time of discharge  Generalized weakness/tremors - Patient physical exam consistent with essential tremors, already on beta blockers - Outpatient PT - Generalized weakness most likely related to infectious process  Diastolic CHF - Currently appears to be euvolemic , resume home medication.  Diabetes mellitus - Resume home medication on discharge  Hypothyroidism - Continue with Synthroid  Hypertension  - Continue with metoprolol   Discharge Condition:  stable  Follow UP  Follow-up Information    Follow up with Chevis Pretty, FNP. Schedule an appointment as soon as possible for a visit in 1 week.   Specialty:  Family Medicine   Contact information:   [address] [place] [address] [phone]         Discharge Instructions  and  Discharge Medications         Discharge Instructions    AMB Referral to [facility]    Complete by:  As directed   Reason for consult:  Post hospital monitoring.  Diagnoses of:   Heart Failure Diabetes    Expected date of contact:  1-3 days (reserved for hospital discharges)  Please assign to community nurse for transition of care calls  and assess for home visits. Questions please call:   Natividad Brood, RN BSN [facility] Liaison  [phone] business mobile phone Toll free office [phone]     Discharge instructions    Complete by:  As directed   Follow with Primary MD Chevis Pretty, FNP in 7 days   Get CBC, CMP, UA, Urine culture , checked  by Primary MD next visit.    Activity: As tolerated with Full fall precautions use walker/cane & assistance as needed   Disposition Home    Diet: Heart Healthy, carbohydrate  modified  , with feeding assistance and aspiration precautions.  For Heart failure patients - Check your Weight same time everyday, if you gain over 2 pounds, or you develop in leg swelling, experience more shortness of breath or chest pain, call your Primary MD immediately. Follow Cardiac Low Salt Diet and 1.5 lit/day fluid restriction.   On your next visit with your primary care physician please Get Medicines reviewed and adjusted.   Please request your Prim.MD to go over all Hospital Tests and Procedure/Radiological results at the follow up, please get all Hospital records sent to your Prim MD by signing hospital release before you go home.   If you experience worsening of your admission symptoms, develop shortness of breath, life threatening emergency, suicidal or homicidal thoughts you must seek medical attention immediately by calling 911 or calling your MD immediately  if symptoms less severe.  You Must read complete instructions/literature along with all the possible adverse reactions/side effects for all the Medicines you take and that have been prescribed to you. Take any new Medicines after you have completely understood and accpet all the possible adverse reactions/side effects.   Do not drive, operating heavy machinery, perform activities at heights, swimming or participation in water activities or provide baby sitting services if your were admitted for syncope  or siezures until you have seen by Primary MD or a Neurologist and advised to do so again.  Do not drive when taking Pain medications.    Do not take more than prescribed Pain, Sleep and Anxiety Medications  Special Instructions: If you have smoked or chewed Tobacco  in the last 2 yrs please stop smoking, stop any regular Alcohol  and or any Recreational drug use.  Wear Seat belts while driving.   Please note  You were cared for by a hospitalist during your hospital stay. If you have any questions about your discharge medications or the care you received while you were in the hospital after you are discharged, you can call the unit and asked to speak with the hospitalist on call if the hospitalist that took care of you is not available. Once you are discharged, your primary care physician will handle any further medical issues. Please note that NO REFILLS for any discharge medications will be authorized once you are discharged, as it is imperative that you return to your primary care physician (or establish a relationship with a primary care physician if you do not have one) for your aftercare needs so that they can reassess your need for medications and monitor your lab values.     Increase activity slowly    Complete by:  As directed             Medication List    STOP taking these medications        amoxicillin 875 MG tablet  Commonly known as:  AMOXIL      TAKE these medications        acarbose 100 MG tablet  Commonly known as:  PRECOSE  TAKE ONE TABLET BY MOUTH TWICE DAILY     acetaminophen 500 MG tablet  Commonly known as:  TYLENOL  Take 1,000 mg by mouth every 6 (six) hours as needed (pain).     aspirin EC 81 MG tablet  Take 81 mg by mouth daily.     CALCIUM + D PO  Take 1 tablet by mouth daily at 12 noon.     citalopram 20 MG tablet  Commonly known as:  CELEXA  Take 1 tablet (20 mg total) by mouth daily.     Fish Oil 1200 MG Caps  Take 1,200-2,400 mg by  mouth 2 (two) times daily. Take 1 capsule (1200 mg) by mouth daily at noon and 2 capsules (2400 mg) daily at bedtime     furosemide 20 MG tablet  Commonly known as:  LASIX  Take 2 tablets (40 mg total) by mouth 2 (two) times daily.     glipiZIDE 5 MG tablet  Commonly known as:  GLUCOTROL  Take 1 tablet (5 mg total) by mouth 2 (two) times daily before a meal.     glucose blood test strip  Test blood sugar bid. DX E11.9     insulin detemir 100 UNIT/ML injection  Commonly known as:  LEVEMIR  Inject 0.3 mLs (30 Units total) into the skin 2 (two) times daily.     levofloxacin 500 MG tablet  Commonly known as:  LEVAQUIN  Take 1 tablet (500 mg total) by mouth daily.     levothyroxine 125 MCG tablet  Commonly known as:  SYNTHROID, LEVOTHROID  Take 125 mcg by mouth daily before breakfast. Take with a 200 mcg tablet for a 325 mcg dose     levothyroxine 200 MCG tablet  Commonly known as:  SYNTHROID, LEVOTHROID  Take 1 tablet (200 mcg total) by mouth daily before breakfast.     levothyroxine 25 MCG tablet  Commonly known as:  LEVOTHROID  Take 1 tablet (25 mcg total) by mouth daily before breakfast.     MENS MULTI VITAMIN & MINERAL PO  Take 1 tablet by mouth daily.     metFORMIN 1000 MG tablet  Commonly known as:  GLUCOPHAGE  Take 1 tablet (1,000 mg total) by mouth 2 (two) times daily with a meal.     metoprolol tartrate 25 MG tablet  Commonly known as:  LOPRESSOR  TAKE ONE-HALF TABLET BY MOUTH TWICE DAILY     saccharomyces boulardii 250 MG capsule  Commonly known as:  FLORASTOR  Take 1 capsule (250 mg total) by mouth 2 (two) times daily.     simvastatin 80 MG tablet  Commonly known as:  ZOCOR  Take 40 mg by mouth at bedtime.     tamsulosin 0.4 MG Caps capsule  Commonly known as:  FLOMAX  Take 0.4 mg by mouth at bedtime.          Diet and Activity recommendation: See Discharge Instructions above   Consults obtained -    Major procedures and Radiology Reports -  PLEASE review detailed and final reports for all details, in brief -      Dg Chest 2 View  05/23/2015  CLINICAL DATA:  70 year old male with dizziness and weakness for the past 2 weeks. Recently diagnosed with urinary tract infection. EXAM: CHEST  2 VIEW COMPARISON:  Chest x-ray 05/15/2015. FINDINGS: Diffuse peribronchial cuffing. Patchy ill-defined opacities in the left lung base. No pleural effusions. No evidence of pulmonary edema. Heart size is borderline enlarged. Upper mediastinal contours are within normal limits. Atherosclerotic calcifications in the thoracic aorta. Status post median sternotomy. Postoperative changes in the lower cervical region likely from prior thyroidectomy. IMPRESSION: 1. The appearance the chest suggests severe bronchitis, likely with left lower lobe bronchopneumonia. Followup PA and lateral chest X-ray is recommended in 3-4 weeks following trial of antibiotic therapy to ensure resolution and exclude underlying malignancy. 2. Atherosclerosis. Electronically Signed   By: Vinnie Langton M.D.   On: 05/23/2015 17:04  US Scrotum  05/23/2015  CLINICAL DATA:  Left testicular pain, dysuria. EXAM: ULTRASOUND OF SCROTUM TECHNIQUE: Complete ultrasound examination of the testicles, epididymis, and other scrotal structures was performed. COMPARISON:  None. FINDINGS: Right testicle Measurements: 4.1 x 2.9 x 2.3 cm. No mass or microlithiasis visualized. Left testicle Measurements: 3.5 x 3.1 x 2.2 cm. No mass or microlithiasis visualized. Right epididymis:  6 mm cyst is noted. Left epididymis:  1.1 cm cyst is noted. Hydrocele:  None visualized. Varicocele:  None visualized. IMPRESSION: Small bilateral epididymal cysts. No evidence of testicular torsion or mass. Electronically Signed   By: Marijo Conception, M.D.   On: 05/23/2015 18:48    Micro Results     Recent Results (from the past 240 hour(s))  Microscopic Examination     Status: Abnormal   Collection Time: 05/15/15 11:35 AM    Result Value Ref Range Status   WBC, UA >30 (A) 0 -  5 /hpf Final   RBC, UA 3-10 (A) 0 -  2 /hpf Final   Epithelial Cells (non renal) 0-10 0 - 10 /hpf Final   Renal Epithel, UA 0-10 (A) None seen /hpf Final   Bacteria, UA Moderate (A) None seen/Few Final  Urine culture     Status: Abnormal   Collection Time: 05/15/15 12:12 PM  Result Value Ref Range Status   Urine Culture, Routine Final report (A)  Final   Urine Culture result 1 Comment (A)  Final    Comment: Beta hemolytic Streptococcus, group B Greater than 100,000 colony forming units per mL Penicillin and ampicillin are drugs of choice for treatment of beta-hemolytic streptococcal infections. Susceptibility testing of penicillins and other beta-lactam agents approved by the FDA for treatment of beta-hemolytic streptococcal infections need not be performed routinely because nonsusceptible isolates are extremely rare in any beta-hemolytic streptococcus and have not been reported for Streptococcus pyogenes (group A). (CLSI 2011)   Culture, Urine     Status: Abnormal   Collection Time: 05/23/15  8:12 PM  Result Value Ref Range Status   Specimen Description URINE, RANDOM  Final   Special Requests ADDED 2317  Final   Culture MULTIPLE SPECIES PRESENT, SUGGEST RECOLLECTION (A)  Final   Report Status 05/25/2015 FINAL  Final  Culture, blood (routine x 2) Call MD if unable to obtain prior to antibiotics being given     Status: None (Preliminary result)   Collection Time: 05/23/15 10:21 PM  Result Value Ref Range Status   Specimen Description BLOOD RIGHT HAND  Final   Special Requests BOTTLES DRAWN AEROBIC AND ANAEROBIC 5CC EA  Final   Culture NO GROWTH < 12 HOURS  Final   Report Status PENDING  Incomplete  Culture, blood (routine x 2) Call MD if unable to obtain prior to antibiotics being given     Status: None (Preliminary result)   Collection Time: 05/23/15 10:32 PM  Result Value Ref Range Status   Specimen Description BLOOD LEFT  ARM  Final   Special Requests BOTTLES DRAWN AEROBIC AND ANAEROBIC 10CC EA  Final   Culture NO GROWTH < 12 HOURS  Final   Report Status PENDING  Incomplete       Today   Subjective:   Taden Stile today has no headache,no chest or  abdominal pain,no new weakness tingling or numbness, feels much better wants to go home today.   Objective:   Blood pressure 126/56, pulse 49, temperature 97 F (36.1 C), temperature source Oral, resp. rate 19, height 6\' 1"  (  1.854 m), weight 123.605 kg (272 lb 8 oz), SpO2 94 %.   Intake/Output Summary (Last 24 hours) at 05/25/15 1123 Last data filed at 05/25/15 0945  Gross per 24 hour  Intake   1380 ml  Output      0 ml  Net   1380 ml    Exam Awake Alert, Oriented x 3, [place].AT,PERRAL Supple Neck,No JVD, No cervical lymphadenopathy appriciated.  Symmetrical Chest wall movement, Good air movement bilaterally, CTAB RRR,No Gallops,Rubs or new Murmurs, No Parasternal Heave +ve B.Sounds, Abd Soft, Non tender, No rebound -guarding or rigidity, genital exam with nontender scrotum, no masses felt, prostatic exam significant for edematous and tender prostate. No Cyanosis, Clubbing or edema, No new Rash or bruise  Data Review   CBC w Diff:  Lab Results  Component Value Date   WBC 6.8 05/25/2015   WBC 6.9 12/23/2014   WBC 7.8 08/06/2014   HGB 10.3* 05/25/2015   HGB 12.9* 08/06/2014   HCT 30.5* 05/25/2015   HCT 34.5* 12/23/2014   HCT 40.8* 08/06/2014   PLT 185 05/25/2015   PLT 184 12/23/2014   LYMPHOPCT 10 05/21/2015   MONOPCT 9 05/21/2015   EOSPCT 0 05/21/2015   BASOPCT 0 05/21/2015    CMP:  Lab Results  Component Value Date   NA 139 05/25/2015   NA 136 03/25/2015   K 3.9 05/25/2015   CL 102 05/25/2015   CO2 27 05/25/2015   BUN 26* 05/25/2015   BUN 26 03/25/2015   CREATININE 1.49* 05/25/2015   CREATININE 1.16 08/02/2012   PROT 7.3 05/23/2015   PROT 6.9 03/25/2015   ALBUMIN 2.9* 05/23/2015   ALBUMIN 4.0 03/25/2015   BILITOT 1.2  05/23/2015   BILITOT 0.3 03/25/2015   ALKPHOS 70 05/23/2015   AST 29 05/23/2015   ALT 23 05/23/2015  .   Total Time in preparing paper work, data evaluation and todays exam - 35 minutes  Jerre Vandrunen M.D on 05/25/2015 at 11:23 AM  [facility]   Office  [phone]

## 2015-05-26 ENCOUNTER — Encounter: Payer: Self-pay | Admitting: *Deleted

## 2015-05-26 ENCOUNTER — Other Ambulatory Visit: Payer: Self-pay | Admitting: *Deleted

## 2015-05-26 NOTE — Patient Outreach (Addendum)
05/26/15- Telephone call to patient for transition of care week 1 (pt in hospital 05/23/15-05/25/15 for acute bacterial prostatitis), pt states he is getting antibiotic filled today and has all medications on hand and taking as prescribed, pt reports fasting CBG 56 and then ate food and drank, pt did not want to recheck CBG while on the phone per RN CM request.  Pt to call and make post hospital follow up appointment with primary MD. Scales to be drop shipped to patient's home supplied by [facility]. RN CM faxed barrier letter and transition of care note to primary MD office- Ronnald Collum FNP (Medications in system for primary care provider to view)  [place] Problem One        Most Recent Value   Care Plan Problem One  Knowledge deficit related to CHF   Role Documenting the Problem One  Care Management Portal for Problem One  Active   [facility] Long Term Goal (31-90 days)  pt will have no admissions realted to heart failure within 90 days   [facility] Long Term Goal Start Date  05/26/15   Interventions for Problem One Long Term Goal  RN CM reviwed CHF action plan with pt, sent In Basket to Freda Jackson requesting scales be drop shipped to patient's home,  RN CM explained to pt the importance of daily weights.   [facility] CM Short Term Goal #1 (0-30 days)  pt will call today and make appointment for primary MD post hospital follow up visit to be seen within one week   [facility] CM Short Term Goal #1 Start Date  05/26/15   Interventions for Short Term Goal #1  RN CM reinforced importance of seeing primary MD within one week, pt states he is able to make the appointment himself      PLAN Follow up with home visit 05/29/15 for initial home visit  Jacqlyn Larsen [facility], BSN [place] Coordinator [phone]

## 2015-05-28 ENCOUNTER — Telehealth: Payer: Self-pay | Admitting: *Deleted

## 2015-05-28 LAB — CULTURE, BLOOD (ROUTINE X 2)
Culture: NO GROWTH
Culture: NO GROWTH

## 2015-05-28 NOTE — Telephone Encounter (Signed)
Call Completed on 05/27/15 and Appointment Scheduled: Yes, Date: 06/02/15 with Evelina Dun, [place].   DISCHARGE INFORMATION Date of Discharge:05/25/15   Discharge Facility: [facility]  Principal Discharge Diagnosis: Heart failure, prostatitis  Patient and/or caregiver is knowledgeable of his/her condition(s) and treatment: Yes  MEDICATION RECONCILIATION Medication list reviewed with patient:Yes Discharge Medications reconciled with home medications.   Patient is able to obtain needed medications:Yes  ACTIVITIES OF DAILY LIVING  Is the patient able to perform his/her own ADLs: Yes.    Patient is receiving home health services: No.  PATIENT EDUCATION Questions/Concerns Discussed: Patient states that he is feeling much better and that he is taking his antibiotic as prescribed. He has no questions or concerns at this time.

## 2015-05-29 ENCOUNTER — Other Ambulatory Visit: Payer: Self-pay | Admitting: *Deleted

## 2015-05-29 ENCOUNTER — Encounter: Payer: Self-pay | Admitting: *Deleted

## 2015-05-29 ENCOUNTER — Telehealth: Payer: Self-pay

## 2015-05-29 NOTE — Patient Outreach (Signed)
[facility]) Care Management   05/29/2015  [name] January 23, 1946 [id number]  [name] is an 70 y.o. male  Subjective:  Initial home visit with pt, HIPAA verified, pt states he does not have scales yet that have been shipped (supplied by [facility]), pt states he will set up scales and start weighing once he receives them.  Pt reports he does not feel diabetes is an issue with Hgb AIC 7.3 on 03/25/15 pt states he has no goal he wants to work towards, checks CBG daily and does not record.  Pt reports he feels "tired most of the time now"  Pt states he eats a lot of cereal and sandwiches, lives alone, does not have caregiver, has son that he does not see.  Pt looks after his own medication and refuses prefilled med box.  Pt has walker but does not use often.  Objective:   Filed Vitals:   05/29/15 1048  BP: 120/58  Pulse: 40  Resp: 18  Height: 1.854 m ('6\' 1"' )  Weight: 270 lb (122.471 kg)  SpO2: 98%   ROS  Physical Exam  Constitutional: He is oriented to person, place, and time. He appears well-developed and well-nourished.  HENT:  Head: Normocephalic.  Neck: Normal range of motion. Neck supple.  Cardiovascular:  Heart rate 40  Respiratory: Effort normal and breath sounds normal.  GI: Soft. Bowel sounds are normal.  Musculoskeletal: Normal range of motion. He exhibits edema.  Dependent edema LE BL  Neurological: He is alert and oriented to person, place, and time.  Skin: Skin is warm and dry.  Psychiatric: He has a normal mood and affect. His behavior is normal. Judgment and thought content normal.    Encounter Medications:   Outpatient Encounter Prescriptions as of 05/29/2015  Medication Sig  . acarbose (PRECOSE) 100 MG tablet TAKE ONE TABLET BY MOUTH TWICE DAILY  . acetaminophen (TYLENOL) 500 MG tablet Take 1,000 mg by mouth every 6 (six) hours as needed (pain).  Marland Kitchen amoxicillin (AMOXIL) 875 MG tablet Take 875 mg by mouth 2 (two) times daily.  Marland Kitchen aspirin EC 81  MG tablet Take 81 mg by mouth daily.  . Calcium Citrate-Vitamin D (CALCIUM + D PO) Take 1 tablet by mouth daily at 12 noon.  . furosemide (LASIX) 20 MG tablet Take 2 tablets (40 mg total) by mouth 2 (two) times daily. (Patient taking differently: Take 20 mg by mouth daily. )  . glipiZIDE (GLUCOTROL) 5 MG tablet Take 1 tablet (5 mg total) by mouth 2 (two) times daily before a meal.  . glucose blood test strip Test blood sugar bid. DX E11.9  . insulin detemir (LEVEMIR) 100 UNIT/ML injection Inject 0.3 mLs (30 Units total) into the skin 2 (two) times daily.  Marland Kitchen levofloxacin (LEVAQUIN) 500 MG tablet Take 1 tablet (500 mg total) by mouth daily.  Marland Kitchen levothyroxine (LEVOTHROID) 25 MCG tablet Take 1 tablet (25 mcg total) by mouth daily before breakfast.  . levothyroxine (SYNTHROID, LEVOTHROID) 200 MCG tablet Take 1 tablet (200 mcg total) by mouth daily before breakfast. (Patient taking differently: Take 200 mcg by mouth daily before breakfast. Take with a 125 mcg tablet for a 325 mcg dose)  . metFORMIN (GLUCOPHAGE) 1000 MG tablet Take 1 tablet (1,000 mg total) by mouth 2 (two) times daily with a meal.  . metoprolol tartrate (LOPRESSOR) 25 MG tablet TAKE ONE-HALF TABLET BY MOUTH TWICE DAILY  . Multiple Vitamins-Minerals (MENS MULTI VITAMIN & MINERAL PO) Take 1 tablet  by mouth daily.    . Omega-3 Fatty Acids (FISH OIL) 1200 MG CAPS Take 1,200-2,400 mg by mouth 2 (two) times daily. Take 1 capsule (1200 mg) by mouth daily at noon and 2 capsules (2400 mg) daily at bedtime  . saccharomyces boulardii (FLORASTOR) 250 MG capsule Take 1 capsule (250 mg total) by mouth 2 (two) times daily.  . simvastatin (ZOCOR) 80 MG tablet Take 40 mg by mouth at bedtime.   . tamsulosin (FLOMAX) 0.4 MG CAPS capsule Take 0.4 mg by mouth at bedtime.  . citalopram (CELEXA) 20 MG tablet Take 1 tablet (20 mg total) by mouth daily. (Patient not taking: Reported on 05/29/2015)  . levothyroxine (SYNTHROID, LEVOTHROID) 125 MCG tablet Take 125 mcg  by mouth daily before breakfast. Reported on 05/29/2015   Facility-Administered Encounter Medications as of 05/29/2015  Medication  . cyanocobalamin ((VITAMIN B-12)) injection 1,000 mcg    Functional Status:   In your present state of health, do you have any difficulty performing the following activities: 05/29/2015 05/24/2015  Hearing? Y N  Vision? N N  Difficulty concentrating or making decisions? N N  Walking or climbing stairs? N N  Dressing or bathing? N N  Doing errands, shopping? N N  Preparing Food and eating ? N -  Using the Toilet? N -  In the past six months, have you accidently leaked urine? N -  Do you have problems with loss of bowel control? N -  Managing your Medications? N -  Managing your Finances? N -  Housekeeping or managing your Housekeeping? N -    Fall/Depression Screening:    PHQ 2/9 Scores 05/29/2015 03/25/2015 03/02/2015 01/13/2015 01/06/2015 12/01/2014 11/08/2014  PHQ - 2 Score 0 0 0 0 0 0 0  PHQ- 9 Score - - - - - - -   Fall Risk  05/29/2015 03/25/2015 03/02/2015 01/13/2015 01/06/2015  Falls in the past year? Yes No No No No  Number falls in past yr: 1 - - - -  Injury with Fall? No - - - -  Risk for fall due to : Medication side effect - - - -  Follow up Education provided;Falls evaluation completed - - - -  Patient was recently discharged from hospital and all medications have been reviewed.  Assessment:  RN CM called primary MD office and spoke with Marco Collie nurse and reported today's heart rate 40 and pt does have fatigue.  Barnett Applebaum will report to MD and they will call pt back.  Pt able to check his own radial pulse and plans to do this daily.  RN CM reviewed EMMI HF and diabetes material, gave [facility] calendar and pt has 24 hour nurse line magnet, pt does not want to make any changes to consent form on file.  RN CM faxed initial home visit and barrier letter to primary MD office to Ronnald Collum NP.  Focus of teaching will be on CHF and symptom management. Mandy from  primary MD office Ronnald Collum NP) called back and states pt is to stop metoprolol and she will notify pt.  RN CM is to call pt next week for transition of care.  THN CM Care Plan Problem One        Most Recent Value   Care Plan Problem One  Knowledge deficit related to CHF   Role Documenting the Problem One  Care Management [place] for Problem One  Active   THN Long Term Goal (31-90 days)  pt  will have no admissions realted to heart failure within 90 days   THN Long Term Goal Start Date  05/26/15   Interventions for Problem One Long Term Goal  RN CM gave pt CHF booklet  " LIving Better with HF", reviewed importance of low salt diet, taking medicaitons as prescribed, being active, daily weights.   THN CM Short Term Goal #1 (0-30 days)  pt will call today and make appointment for primary MD post hospital follow up visit to be seen within one week   [facility] CM Short Term Goal #1 Start Date  05/26/15   [facility] CM Short Term Goal #1 Met Date  05/29/15   Interventions for Short Term Goal #1  reviewed appointment date for next week 06/02/15   THN CM Short Term Goal #2 (0-30 days)  pt will weigh daily and record within 30 days   THN CM Short Term Goal #2 Start Date  05/29/15   Interventions for Short Term Goal #2  RN CM reviewed importance of daily weights and recording, instructed pt to weigh in morning on hard surface after emptying bladder, before eating, ask pt to call MD for 3 pound weight gain in one day or 5 pounds in one week   THN CM Short Term Goal #3 (0-30 days)  pt will verbalize CHF zones/ action plan within 30 days.   THN CM Short Term Goal #3 Start Date  05/29/15   Interventions for Short Tern Goal #3  RN CM reviewed CHF zones/ action plan with patient and ask pt to call early for change in health status and symptoms.      Plan: continue weekly transition of care calls Follow up with home visit on 06/25/15 Continue CHF teaching/ action plan Assess heart rate  Jacqlyn Larsen [facility],  BSN [place] Coordinator [phone]

## 2015-05-29 NOTE — Telephone Encounter (Signed)
Home Health nurse here at office received call from Jacqlyn Larsen with [facility] concerning patient this am. She had been out to visit with patient and his pulse was 40. He states he feels weak but other than that no complaints. Advised we would discuss with MD here and contact her and patient with further instructions.

## 2015-05-29 NOTE — Telephone Encounter (Signed)
Notified patient to stop metoprolol per Dr Wendi Snipes and monitor pulse for the next few days. Advised to contact office if pulse gets lower or does not improved. Advised that [facility] nurse would call him next week to check on him and advised patient to contact office with any further needs

## 2015-05-29 NOTE — Telephone Encounter (Signed)
Spoke with Dr Wendi Snipes who reviewed patients chart and he advised to hold  Metoprolol and continue to monitor pulse daily. Nurse called and advised Jacqlyn Larsen of orders and she stated that patient is able to check his pulse and she had advised him to do so and also what the normal pulse ranges are in an adult. Brantley Fling that we would call patient with same instructions and have him to monitor pulse. She will follow up with him by phone next week. Attempted to notify patient but NA at home phone.

## 2015-05-30 ENCOUNTER — Telehealth: Payer: Self-pay | Admitting: *Deleted

## 2015-05-30 DIAGNOSIS — E785 Hyperlipidemia, unspecified: Secondary | ICD-10-CM | POA: Diagnosis present

## 2015-05-30 DIAGNOSIS — I110 Hypertensive heart disease with heart failure: Secondary | ICD-10-CM | POA: Diagnosis not present

## 2015-05-30 DIAGNOSIS — J440 Chronic obstructive pulmonary disease with acute lower respiratory infection: Secondary | ICD-10-CM | POA: Diagnosis not present

## 2015-05-30 DIAGNOSIS — I440 Atrioventricular block, first degree: Secondary | ICD-10-CM | POA: Diagnosis not present

## 2015-05-30 DIAGNOSIS — I5033 Acute on chronic diastolic (congestive) heart failure: Secondary | ICD-10-CM | POA: Diagnosis not present

## 2015-05-30 DIAGNOSIS — Z888 Allergy status to other drugs, medicaments and biological substances status: Secondary | ICD-10-CM | POA: Diagnosis not present

## 2015-05-30 DIAGNOSIS — Z6835 Body mass index (BMI) 35.0-35.9, adult: Secondary | ICD-10-CM | POA: Diagnosis not present

## 2015-05-30 DIAGNOSIS — R918 Other nonspecific abnormal finding of lung field: Secondary | ICD-10-CM | POA: Diagnosis not present

## 2015-05-30 DIAGNOSIS — E119 Type 2 diabetes mellitus without complications: Secondary | ICD-10-CM | POA: Diagnosis not present

## 2015-05-30 DIAGNOSIS — J189 Pneumonia, unspecified organism: Secondary | ICD-10-CM | POA: Diagnosis not present

## 2015-05-30 DIAGNOSIS — I4891 Unspecified atrial fibrillation: Secondary | ICD-10-CM | POA: Diagnosis not present

## 2015-05-30 DIAGNOSIS — Z952 Presence of prosthetic heart valve: Secondary | ICD-10-CM | POA: Diagnosis not present

## 2015-05-30 DIAGNOSIS — R609 Edema, unspecified: Secondary | ICD-10-CM | POA: Diagnosis present

## 2015-05-30 DIAGNOSIS — Z87891 Personal history of nicotine dependence: Secondary | ICD-10-CM | POA: Diagnosis not present

## 2015-05-30 DIAGNOSIS — I10 Essential (primary) hypertension: Secondary | ICD-10-CM | POA: Diagnosis not present

## 2015-05-30 DIAGNOSIS — T447X5A Adverse effect of beta-adrenoreceptor antagonists, initial encounter: Secondary | ICD-10-CM | POA: Diagnosis not present

## 2015-05-30 DIAGNOSIS — R001 Bradycardia, unspecified: Secondary | ICD-10-CM | POA: Diagnosis not present

## 2015-05-30 DIAGNOSIS — E039 Hypothyroidism, unspecified: Secondary | ICD-10-CM | POA: Diagnosis not present

## 2015-05-30 DIAGNOSIS — E669 Obesity, unspecified: Secondary | ICD-10-CM | POA: Diagnosis present

## 2015-05-30 DIAGNOSIS — I058 Other rheumatic mitral valve diseases: Secondary | ICD-10-CM | POA: Diagnosis not present

## 2015-05-30 DIAGNOSIS — I517 Cardiomegaly: Secondary | ICD-10-CM | POA: Diagnosis not present

## 2015-05-30 DIAGNOSIS — I509 Heart failure, unspecified: Secondary | ICD-10-CM | POA: Diagnosis not present

## 2015-05-30 DIAGNOSIS — Z794 Long term (current) use of insulin: Secondary | ICD-10-CM | POA: Diagnosis not present

## 2015-05-30 DIAGNOSIS — J181 Lobar pneumonia, unspecified organism: Secondary | ICD-10-CM | POA: Diagnosis not present

## 2015-05-30 DIAGNOSIS — Z79899 Other long term (current) drug therapy: Secondary | ICD-10-CM | POA: Diagnosis not present

## 2015-05-30 NOTE — Telephone Encounter (Signed)
Call from pt stating he is bradycardic with rate as low as 40 Pt also complaining of dizziness Pt informed to go to ED Verbalizes understanding

## 2015-06-01 ENCOUNTER — Telehealth: Payer: Self-pay | Admitting: Cardiology

## 2015-06-01 ENCOUNTER — Inpatient Hospital Stay (HOSPITAL_COMMUNITY): Admission: RE | Admit: 2015-06-01 | Payer: Medicare Other | Source: Ambulatory Visit

## 2015-06-01 ENCOUNTER — Telehealth: Payer: Self-pay | Admitting: Family Medicine

## 2015-06-01 ENCOUNTER — Other Ambulatory Visit: Payer: Self-pay | Admitting: *Deleted

## 2015-06-01 NOTE — Patient Outreach (Signed)
06/01/15- Telephone call for follow up Red EMMI flag 05/30/15 on "worsening shortness of breath" .  No answer to patient's phone and no option to leave voicemail.  PLAN Call pt this week for transition of care week 2  Ask about heart rate/ changes by MD  Jacqlyn Larsen [facility], [place] Coordinator [phone]

## 2015-06-01 NOTE — Telephone Encounter (Signed)
Called number available, could not be connected to room.  No answer at listed number.

## 2015-06-01 NOTE — Telephone Encounter (Signed)
New MEssage  P is admitted in [place]- requested to speak w/ RN- would not specify nature of call. .plae

## 2015-06-02 ENCOUNTER — Ambulatory Visit: Payer: Medicare Other | Admitting: Family

## 2015-06-03 ENCOUNTER — Encounter: Payer: Self-pay | Admitting: Nurse Practitioner

## 2015-06-03 ENCOUNTER — Ambulatory Visit: Payer: Self-pay | Admitting: *Deleted

## 2015-06-03 ENCOUNTER — Other Ambulatory Visit: Payer: Self-pay | Admitting: *Deleted

## 2015-06-03 ENCOUNTER — Telehealth: Payer: Self-pay | Admitting: *Deleted

## 2015-06-03 NOTE — Telephone Encounter (Signed)
Returned call to pt. He was calling to schedule a post-hospitalization appt.  He was admitted to [facility] on 05/30/15 and was cleared for discharge home today, 06/03/15. Pt stated he went into the hospital for pneumonia and palpitations. Denies having any palpitations, SOB, CP, N/V, dizziness or sweating at this time. Pt is scheduled to see Dr [name] on 06/08/15.

## 2015-06-03 NOTE — Telephone Encounter (Signed)
New message    Pt calling for rn   Pt DID NOT WANT TO ANSWER SMART PHRASE QUESTIONS:    Patient c/o Palpitations:  High priority if patient c/o lightheadedness and shortness of breath.  1. How long have you been having palpitations? Since being in hospital  2. Are you currently experiencing lightheadedness and shortness of breath? no 3. Have you checked your BP and heart rate? (document readings) no  4. Are you experiencing any other symptoms? none

## 2015-06-03 NOTE — Patient Outreach (Addendum)
06/03/15- Telephone call to patient for transition of care week 2, no answer to telephone and no option to leave voicemail. Pt admitted to [facility] several days ago, RN CM called and pt is in room 205, spoke with pt who reports he went to hospital for " low heart rate and tiredness and now I have irregular heart beat and I'm to follow up with Dr. [name] when I get home"   Pt states he may discharge sometime today.  PLAN Call pt tomorrow for transition of care if discharges today  Jacqlyn Larsen [facility], BSN [place] Coordinator [phone]

## 2015-06-03 NOTE — Telephone Encounter (Signed)
Spoke with patient's family today.  Patient was admitted to [facility] on this past Saturday.     Advised to let patient know we were concerned.    Have him follow up with [name] after his discharge from the hospital.

## 2015-06-03 NOTE — Telephone Encounter (Signed)
Called patient on mobile # listed - this number goes to [facility] with Lovena Le who took this message, and she states patient requested a call back on his "mobile number" - which goes to [place] this # once more to make sure number was dialed correctly - number listed for mobile goes to [facility] to reach patient on home # listed

## 2015-06-03 NOTE — Telephone Encounter (Signed)
New message  Pt called states that he has had an irregular heart beat was recently in the hospital. Req ov. Made appt for 06/08/2015. Pt would still like to speak with a nurse.

## 2015-06-04 ENCOUNTER — Other Ambulatory Visit: Payer: Self-pay | Admitting: *Deleted

## 2015-06-04 ENCOUNTER — Encounter: Payer: Self-pay | Admitting: *Deleted

## 2015-06-04 NOTE — Patient Outreach (Addendum)
06/04/15- Pt discharged from [facility] 06/03/15 for issues with heart rate, telephone call to pt for transition of care week 1, spoke with pt, HIPAA verified, pt states " I'm off the metoprolol so my heart rate has been normal"  Pt able to check his pulse rate and did check for RN CM with reading of 68.  Pt reports he refused home health services citing he does not feel like he needs the service, pt does drive.  Pt verbalizes understanding of upcoming MD appointments.  Pt states he will start weighing this week.  Fasting CBG today 151.  Pt agreeable to weekly transition of care calls and RN CM to follow up with home visit 06/25/15.  RN CM faxed barrier letter and transition of care note primary care office-[facility] NP.  Outpatient Encounter Prescriptions as of 06/04/2015  Medication Sig  . acarbose (PRECOSE) 100 MG tablet TAKE ONE TABLET BY MOUTH TWICE DAILY  . acetaminophen (TYLENOL) 500 MG tablet Take 1,000 mg by mouth every 6 (six) hours as needed (pain).  Marland Kitchen amoxicillin (AMOXIL) 875 MG tablet Take 875 mg by mouth 2 (two) times daily. Reported on 06/04/2015  . aspirin EC 81 MG tablet Take 81 mg by mouth daily.  . Calcium Citrate-Vitamin D (CALCIUM + D PO) Take 1 tablet by mouth daily at 12 noon.  . citalopram (CELEXA) 20 MG tablet Take 1 tablet (20 mg total) by mouth daily.  . furosemide (LASIX) 20 MG tablet Take 2 tablets (40 mg total) by mouth 2 (two) times daily. (Patient taking differently: Take 20 mg by mouth daily. )  . glipiZIDE (GLUCOTROL) 5 MG tablet Take 1 tablet (5 mg total) by mouth 2 (two) times daily before a meal.  . glucose blood test strip Test blood sugar bid. DX E11.9  . insulin detemir (LEVEMIR) 100 UNIT/ML injection Inject 0.3 mLs (30 Units total) into the skin 2 (two) times daily.  Marland Kitchen levofloxacin (LEVAQUIN) 500 MG tablet Take 1 tablet (500 mg total) by mouth daily.  Marland Kitchen levothyroxine (LEVOTHROID) 25 MCG tablet Take 1 tablet (25 mcg total) by mouth daily before breakfast.  .  levothyroxine (SYNTHROID, LEVOTHROID) 200 MCG tablet Take 1 tablet (200 mcg total) by mouth daily before breakfast. (Patient taking differently: Take 200 mcg by mouth daily before breakfast. Take with a 125 mcg tablet for a 325 mcg dose)  . metFORMIN (GLUCOPHAGE) 1000 MG tablet Take 1 tablet (1,000 mg total) by mouth 2 (two) times daily with a meal.  . Multiple Vitamins-Minerals (MENS MULTI VITAMIN & MINERAL PO) Take 1 tablet by mouth daily.    . Omega-3 Fatty Acids (FISH OIL) 1200 MG CAPS Take 1,200-2,400 mg by mouth 2 (two) times daily. Take 1 capsule (1200 mg) by mouth daily at noon and 2 capsules (2400 mg) daily at bedtime  . saccharomyces boulardii (FLORASTOR) 250 MG capsule Take 1 capsule (250 mg total) by mouth 2 (two) times daily.  . simvastatin (ZOCOR) 80 MG tablet Take 40 mg by mouth at bedtime.   . tamsulosin (FLOMAX) 0.4 MG CAPS capsule Take 0.4 mg by mouth at bedtime.  Marland Kitchen levothyroxine (SYNTHROID, LEVOTHROID) 125 MCG tablet Take 125 mcg by mouth daily before breakfast. Reported on 06/04/2015  . metoprolol tartrate (LOPRESSOR) 25 MG tablet TAKE ONE-HALF TABLET BY MOUTH TWICE DAILY (Patient not taking: Reported on 06/04/2015)   Facility-Administered Encounter Medications as of 06/04/2015  Medication  . cyanocobalamin ((VITAMIN B-12)) injection 1,000 mcg    [facility] CM Care Plan Problem One  Most Recent Value   Care Plan Problem One  Knowledge deficit related to CHF   Role Documenting the Problem One  Care Management Coordinator   THN Long Term Goal (31-90 days)  pt will have no admissions realted to heart failure within 90 days   THN Long Term Goal Start Date  05/26/15   Interventions for Problem One Long Term Goal  Rn CM reviewed all upcoming appointments, reviewed medications over the phone.   THN CM Short Term Goal #1 (0-30 days)  pt will call today and make appointment for primary MD post hospital follow up visit to be seen within one week   [facility] CM Short Term Goal #1 Start Date   05/26/15   [facility] CM Short Term Goal #1 Met Date  06/04/15 [pt has appointment with primary MD 06/15/15]   Interventions for Short Term Goal #1  RN CM reviewed importance of attending all appointments   THN CM Short Term Goal #2 (0-30 days)  pt will weigh daily and record within 30 days   THN CM Short Term Goal #2 Start Date  05/29/15   Interventions for Short Term Goal #2  RN CM reviewed importance of setting up scales and weighing daily, pt verbalizes understanding.   THN CM Short Term Goal #3 (0-30 days)  pt will verbalize CHF zones/ action plan within 30 days.   THN CM Short Term Goal #3 Start Date  05/29/15   Interventions for Short Tern Goal #3  RN CM reviewed CHF zones/ action plan     PLAN Continue weekly transition of care calls Assess weight, CBG   Jacqlyn Larsen [facility], BSN [place] Coordinator [phone]

## 2015-06-04 NOTE — Telephone Encounter (Signed)
Patient has been spoken to since this current phone call

## 2015-06-07 NOTE — Progress Notes (Signed)
HPI The patient presents for followup of aortic valve replacement.  He was in the hospital twice since I last saw him.  Earlier this month he was at [facility] with acute on chronic diastolic heart failure, prostatitis and probable pneumonia.  I reviewed these records.  He was discharged but it sounds like he was home for a very short period before he went to [facility].  I don't have these records.  He apparently was treated further for pneumonia.  He also was noted to have bradycardia and was taken off of beta blockers.  He said that he had been light headed and presyncopal.  Over the last couple of days since discharge he denies any cardiovascular symptoms.  The patient denies any new symptoms such as chest discomfort, neck or arm discomfort. There has been no new shortness of breath, PND or orthopnea. There have been no reported palpitations, presyncope or syncope.   Allergies  Allergen Reactions  . Phenothiazines Anaphylaxis  . Actos [Pioglitazone] Swelling    Current Outpatient Prescriptions  Medication Sig Dispense Refill  . acarbose (PRECOSE) 100 MG tablet TAKE ONE TABLET BY MOUTH TWICE DAILY 180 tablet 0  . acetaminophen (TYLENOL) 500 MG tablet Take 1,000 mg by mouth every 6 (six) hours as needed (pain).    Marland Kitchen amoxicillin (AMOXIL) 875 MG tablet Take 875 mg by mouth 2 (two) times daily. Reported on 06/04/2015    . aspirin EC 81 MG tablet Take 81 mg by mouth daily.    . Calcium Citrate-Vitamin D (CALCIUM + D PO) Take 1 tablet by mouth daily at 12 noon.    . citalopram (CELEXA) 20 MG tablet Take 1 tablet (20 mg total) by mouth daily. 30 tablet 0  . furosemide (LASIX) 20 MG tablet Take 2 tablets (40 mg total) by mouth 2 (two) times daily. (Patient taking differently: Take 20 mg by mouth daily. ) 30 tablet 4  . glipiZIDE (GLUCOTROL) 5 MG tablet Take 1 tablet (5 mg total) by mouth 2 (two) times daily before a meal. 180 tablet 1  . glucose blood test strip Test blood sugar bid. DX E11.9 100  each 5  . insulin detemir (LEVEMIR) 100 UNIT/ML injection Inject 0.3 mLs (30 Units total) into the skin 2 (two) times daily. 60 mL 11  . levofloxacin (LEVAQUIN) 500 MG tablet Take 1 tablet (500 mg total) by mouth daily. 16 tablet 0  . levofloxacin (LEVAQUIN) 750 MG tablet Take 1 tablet by mouth daily. x 7 days    . levothyroxine (LEVOTHROID) 25 MCG tablet Take 1 tablet (25 mcg total) by mouth daily before breakfast. 90 tablet 0  . levothyroxine (SYNTHROID, LEVOTHROID) 125 MCG tablet Take 125 mcg by mouth daily before breakfast. Reported on 06/04/2015    . levothyroxine (SYNTHROID, LEVOTHROID) 200 MCG tablet Take 1 tablet (200 mcg total) by mouth daily before breakfast. (Patient taking differently: Take 200 mcg by mouth daily before breakfast. Take with a 125 mcg tablet for a 325 mcg dose) 90 tablet 3  . metFORMIN (GLUCOPHAGE) 1000 MG tablet Take 1 tablet (1,000 mg total) by mouth 2 (two) times daily with a meal. 180 tablet 3  . metoprolol tartrate (LOPRESSOR) 25 MG tablet TAKE ONE-HALF TABLET BY MOUTH TWICE DAILY 60 tablet 5  . Multiple Vitamins-Minerals (MENS MULTI VITAMIN & MINERAL PO) Take 1 tablet by mouth daily.      . Omega-3 Fatty Acids (FISH OIL) 1200 MG CAPS Take 1,200-2,400 mg by mouth 2 (two) times daily. Take  1 capsule (1200 mg) by mouth daily at noon and 2 capsules (2400 mg) daily at bedtime    . potassium chloride SA (K-DUR,KLOR-CON) 20 MEQ tablet Take 1 tablet by mouth 2 (two) times daily.    Marland Kitchen saccharomyces boulardii (FLORASTOR) 250 MG capsule Take 1 capsule (250 mg total) by mouth 2 (two) times daily. 60 capsule 1  . simvastatin (ZOCOR) 80 MG tablet Take 40 mg by mouth at bedtime.     . tamsulosin (FLOMAX) 0.4 MG CAPS capsule Take 0.4 mg by mouth at bedtime.    . VENTOLIN HFA 108 (90 Base) MCG/ACT inhaler Inhale 1-2 puffs into the lungs daily as needed.     Current Facility-Administered Medications  Medication Dose Route Frequency Provider Last Rate Last Dose  . cyanocobalamin  ((VITAMIN B-12)) injection 1,000 mcg  1,000 mcg Intramuscular Q30 days Claretta Fraise, MD   1,000 mcg at 12/23/14 1110    Past Medical History  Diagnosis Date  . 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 BPH (benign prostatic hypertrophy) 04/15/2010  . DEPRESSION 01/22/2009  . HYPERCHOLESTEROLEMIA 01/22/2009  . 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 Morbid obesity ([place])   . Heart valve replaced   . Colon polyps   . Ulcer   . Heart murmur   . HYPERTENSION 01/22/2009    dr Casimer Lanius  . Arthritis   . Tremors of nervous system     ?ptsd    Past Surgical History  Procedure Laterality Date  . Appendectomy    . Aortic valve replacement  July 2006    #20 stentless [place] porcine valve  . Dental surgery  05/2004    Dental extractions  . Heel spur surgery Bilateral     resection of heel spur  . Colonoscopy  04/24/2007    [name]: normal  . Tonsillectomy    . Bilateral cataract surg    . Colonoscopy with esophagogastroduodenoscopy (egd) N/A 04/05/2012    Procedure: COLONOSCOPY WITH ESOPHAGOGASTRODUODENOSCOPY (EGD);  Surgeon: Daneil Dolin, MD;  [facility];  Service: Endoscopy;  Laterality: N/A;  10;15  . Thyroidectomy  03/21/2013    DR [name]  . Thyroidectomy N/A 03/21/2013    Procedure: THYROIDECTOMY;  Surgeon: [name], MD;  Location: [place];  Service: General;  Laterality: N/A;  . Knee arthroscopy with lateral menisectomy Right 04/04/2014    Procedure: KNEE ARTHROSCOPY WITH LATERAL MENISECTOMY;  Surgeon: Carole Civil, MD;  Location: [facility];  Service: Orthopedics;  Laterality: Right;   ROS:   As stated in the HPI and negative for all other systems.  PHYSICAL EXAM BP 148/78 mmHg  Pulse 80  Ht 6\' 1"  (1.854 m)  Wt 276 lb 6.4 oz (125.374 kg)  BMI 36.47 kg/m2 GENERAL:  No acute distress NECK:  No jugular venous distention, waveform within normal limits, carotid upstroke brisk and symmetric, no bruits, no thyromegaly LUNGS:  Clear to auscultation bilaterally CHEST:  Well  healed sternotomy scar. HEART:  PMI not displaced or sustained,S1 and S2 within normal limits, no S3, no S4 no clicks, no rubs, systolic murmur mid peaking and  radiating into the carotids  ABD:  Flat, positive bowel sounds normal in frequency in pitch, no bruits, no rebound, no guarding, no midline pulsatile mass, no hepatomegaly, no splenomegaly, obese EXT:  2 plus pulses throughout, moderate edema, no cyanosis no clubbing NEURO:  Nonfocal   EKG:  Sinus rhythm, rate 80, right bundle branch block, left axis deviation, first-degree AV block profound, no acute ST-T wave changes.  06/08/2015  ASSESSMENT AND PLAN  CHRONIC SYSTOLIC AND DIASTOLIC HF:   He seems to be euvolemic.  No change in therapy is planned at this point.   AORTIC VALVE REPLACEMENT, HX OF - He had stable valve prosthesis in the hospital on echocardiography. No change in therapy is indicated.  I did review the echo report from this hospitalization.  He did appear to have a moderate gradient but no indication for TEE  HYPERTENSION -  His blood pressure is slightly high today. He will continue the meds as listed.  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  He missed a scheduled follow up.  BRADYCARDIA - He will remain off of the beta blockers.  I will obtain the [place] records.

## 2015-06-08 ENCOUNTER — Ambulatory Visit (INDEPENDENT_AMBULATORY_CARE_PROVIDER_SITE_OTHER): Payer: Medicare Other | Admitting: Cardiology

## 2015-06-08 ENCOUNTER — Encounter: Payer: Self-pay | Admitting: Cardiology

## 2015-06-08 VITALS — BP 148/78 | HR 80 | Ht 73.0 in | Wt 276.4 lb

## 2015-06-08 DIAGNOSIS — I359 Nonrheumatic aortic valve disorder, unspecified: Secondary | ICD-10-CM | POA: Diagnosis not present

## 2015-06-08 DIAGNOSIS — E785 Hyperlipidemia, unspecified: Secondary | ICD-10-CM

## 2015-06-08 NOTE — Patient Instructions (Signed)
Your physician recommends that you schedule a follow-up appointment in: 2 Months in [place]

## 2015-06-10 ENCOUNTER — Telehealth: Payer: Self-pay | Admitting: Cardiology

## 2015-06-10 ENCOUNTER — Other Ambulatory Visit: Payer: Self-pay | Admitting: *Deleted

## 2015-06-10 NOTE — Telephone Encounter (Signed)
Faxed Release signed by patient to [facility] to obtain records for 05/30/15 - 06/03/15 per Dr [name]. lp

## 2015-06-10 NOTE — Patient Outreach (Signed)
06/10/15- Telephone call to patient for transition of care week 2, spoke with pt, HIPAA verified, pt reports he has all medications and no changes reported, pt to follow up with primary care MD on 06/17/15. Pt states " I still haven't taken the scales out of the box, but I'm going to" so pt is not weighing daily.  Pt states he saw Dr. Hochrein and received "good report" pt states he has been checking pulse rate daily and most of time "runs around 80"  CBG today 76 and pt states most CBG readings in 70-80's range, denies edema, denies dyspnea.  THN CM Care Plan Problem One        Most Recent Value   Care Plan Problem One  Knowledge deficit related to CHF   Role Documenting the Problem One  Care Management Coordinator   THN Long Term Goal (31-90 days)  pt will have no admissions realted to heart failure within 90 days   THN Long Term Goal Start Date  05/26/15   Interventions for Problem One Long Term Goal  RN CM reviewed findings from most recent MD appointments.   THN CM Short Term Goal #1 Met Date  -- [pt has appointment with primary MD 06/15/15]   THN CM Short Term Goal #2 (0-30 days)  pt will weigh daily and record within 30 days   THN CM Short Term Goal #2 Start Date  05/29/15   Interventions for Short Term Goal #2  RN CM reinforced importance of setting up scales and weighing daily, pt verbalizes understanding.   THN CM Short Term Goal #3 (0-30 days)  pt will verbalize CHF zones/ action plan within 30 days.   THN CM Short Term Goal #3 Start Date  05/29/15   Interventions for Short Tern Goal #3  RN CM reiterated CHF zones/ action plan      PLAN Continue weekly transition of care calls    RNC, BSN [facility] Community Care Coordinator [phone]   

## 2015-06-11 ENCOUNTER — Telehealth: Payer: Self-pay | Admitting: Cardiology

## 2015-06-11 ENCOUNTER — Encounter: Payer: Self-pay | Admitting: Family

## 2015-06-11 ENCOUNTER — Ambulatory Visit (INDEPENDENT_AMBULATORY_CARE_PROVIDER_SITE_OTHER): Payer: Medicare Other | Admitting: Family

## 2015-06-11 VITALS — BP 133/72 | HR 107 | Temp 97.0°F | Ht 73.0 in | Wt 277.0 lb

## 2015-06-11 DIAGNOSIS — N3000 Acute cystitis without hematuria: Secondary | ICD-10-CM | POA: Diagnosis not present

## 2015-06-11 DIAGNOSIS — R350 Frequency of micturition: Secondary | ICD-10-CM | POA: Diagnosis not present

## 2015-06-11 LAB — URINALYSIS, COMPLETE
BILIRUBIN UA: NEGATIVE
Glucose, UA: NEGATIVE
KETONES UA: NEGATIVE
Nitrite, UA: NEGATIVE
PH UA: 5 (ref 5.0–7.5)
PROTEIN UA: NEGATIVE
RBC UA: NEGATIVE
SPEC GRAV UA: 1.015 (ref 1.005–1.030)
Urobilinogen, Ur: 0.2 mg/dL (ref 0.2–1.0)

## 2015-06-11 LAB — MICROSCOPIC EXAMINATION

## 2015-06-11 MED ORDER — CIPROFLOXACIN HCL 500 MG PO TABS: 500.0000 mg | ORAL_TABLET | Freq: Two times a day (BID) | ORAL | Status: DC

## 2015-06-11 MED ORDER — CEFTRIAXONE SODIUM 1 G IJ SOLR
1.0000 g | Freq: Once | INTRAMUSCULAR | Status: AC
  Administered 2015-06-11: 1 g via INTRAMUSCULAR

## 2015-06-11 NOTE — Patient Instructions (Signed)

## 2015-06-11 NOTE — Progress Notes (Signed)
   Subjective:    Patient ID: [name], male    DOB: Nov 29, 1945, 70 y.o.   MRN: [id number]  Dysuria  This is a new problem. The current episode started in the past 7 days. The problem occurs every urination. The problem has been unchanged. The quality of the pain is described as burning. The pain is at a severity of 2/10. The pain is mild. Associated symptoms include flank pain, frequency and urgency. Pertinent negatives include no discharge, hematuria, nausea or vomiting. He has tried increased fluids for the symptoms. The treatment provided mild relief.   *Pt has recently been hospitalized related to hypotension and UTI.    Review of Systems  Gastrointestinal: Negative for nausea and vomiting.  Genitourinary: Positive for dysuria, urgency, frequency and flank pain. Negative for hematuria.  All other systems reviewed and are negative.      Objective:   Physical Exam  Constitutional: He is oriented to person, place, and time. He appears well-developed and well-nourished. No distress.  HENT:  Head: Normocephalic.  Eyes: Pupils are equal, round, and reactive to light. Right eye exhibits no discharge. Left eye exhibits no discharge.  Neck: Normal range of motion. Neck supple. No thyromegaly present.  Cardiovascular: Normal rate, regular rhythm, normal heart sounds and intact distal pulses.   No murmur heard. Pulmonary/Chest: Effort normal and breath sounds normal. No respiratory distress. He has no wheezes.  Abdominal: Soft. Bowel sounds are normal. He exhibits no distension. There is no tenderness.  Musculoskeletal: Normal range of motion. He exhibits no edema or tenderness.  Negative for CVA tenderness  Neurological: He is alert and oriented to person, place, and time. He has normal reflexes. No cranial nerve deficit.  Skin: Skin is warm and dry. No rash noted. No erythema.  Psychiatric: He has a normal mood and affect. His behavior is normal. Judgment and thought content normal.   Vitals reviewed.    BP 133/72 mmHg  Pulse 107  Temp(Src) 97 F (36.1 C) (Oral)  Ht 6\' 1"  (1.854 m)  Wt 277 lb (125.646 kg)  BMI 36.55 kg/m2     Assessment & Plan:  1. Frequency of urination - Urinalysis, Complete  2. Acute cystitis without hematuria -Force fluids AZO over the counter X2 days RTO prn Culture pending - cefTRIAXone (ROCEPHIN) injection 1 g; Inject 1 g into the muscle once. - ciprofloxacin (CIPRO) 500 MG tablet; Take 1 tablet (500 mg total) by mouth 2 (two) times daily.  Dispense: 20 tablet; Refill: 0 - Urine culture    Evelina Dun, FNP

## 2015-06-11 NOTE — Telephone Encounter (Signed)
Received records from [facility] as requested by Dr [name].  Records given to Dr [name] to review.  (Patient also has upcoming appointment on 08/17/15. lp

## 2015-06-12 LAB — URINE CULTURE

## 2015-06-15 ENCOUNTER — Ambulatory Visit: Payer: Medicare Other | Admitting: Family

## 2015-06-16 ENCOUNTER — Other Ambulatory Visit: Payer: Self-pay | Admitting: *Deleted

## 2015-06-16 NOTE — Patient Outreach (Signed)
Transition of care call #3 attempted, pt was not home, and no answering machine picked up. I will try to reach him again tomorrow.  Deloria Lair [facility] [phone]

## 2015-06-17 ENCOUNTER — Encounter: Payer: Self-pay | Admitting: Family Medicine

## 2015-06-17 ENCOUNTER — Ambulatory Visit (INDEPENDENT_AMBULATORY_CARE_PROVIDER_SITE_OTHER): Payer: Medicare Other | Admitting: Family Medicine

## 2015-06-17 ENCOUNTER — Other Ambulatory Visit: Payer: Self-pay | Admitting: *Deleted

## 2015-06-17 VITALS — BP 132/71 | HR 80 | Temp 97.0°F | Ht 73.0 in | Wt 272.3 lb

## 2015-06-17 DIAGNOSIS — I10 Essential (primary) hypertension: Secondary | ICD-10-CM | POA: Diagnosis not present

## 2015-06-17 DIAGNOSIS — E119 Type 2 diabetes mellitus without complications: Secondary | ICD-10-CM

## 2015-06-17 DIAGNOSIS — I5032 Chronic diastolic (congestive) heart failure: Secondary | ICD-10-CM

## 2015-06-17 LAB — CBC WITH DIFFERENTIAL/PLATELET
BASOS ABS: 0 10*3/uL (ref 0.0–0.2)
Basos: 0 %
EOS (ABSOLUTE): 0.2 10*3/uL (ref 0.0–0.4)
Eos: 4 %
HEMOGLOBIN: 12.4 g/dL — AB (ref 12.6–17.7)
Hematocrit: 37.8 % (ref 37.5–51.0)
IMMATURE GRANS (ABS): 0 10*3/uL (ref 0.0–0.1)
IMMATURE GRANULOCYTES: 0 %
LYMPHS: 24 %
Lymphocytes Absolute: 1.5 10*3/uL (ref 0.7–3.1)
MCH: 32 pg (ref 26.6–33.0)
MCHC: 32.8 g/dL (ref 31.5–35.7)
MCV: 97 fL (ref 79–97)
MONOCYTES: 7 %
Monocytes Absolute: 0.5 10*3/uL (ref 0.1–0.9)
NEUTROS PCT: 65 %
Neutrophils Absolute: 4.2 10*3/uL (ref 1.4–7.0)
PLATELETS: 184 10*3/uL (ref 150–379)
RBC: 3.88 x10E6/uL — AB (ref 4.14–5.80)
RDW: 17 % — ABNORMAL HIGH (ref 12.3–15.4)
WBC: 6.4 10*3/uL (ref 3.4–10.8)

## 2015-06-17 LAB — CMP14+EGFR
ALK PHOS: 46 IU/L (ref 39–117)
ALT: 13 IU/L (ref 0–44)
AST: 18 IU/L (ref 0–40)
Albumin/Globulin Ratio: 1.6 (ref 1.2–2.2)
Albumin: 4.1 g/dL (ref 3.5–4.8)
BILIRUBIN TOTAL: 0.5 mg/dL (ref 0.0–1.2)
BUN/Creatinine Ratio: 19 (ref 10–24)
BUN: 25 mg/dL (ref 8–27)
CO2: 23 mmol/L (ref 18–29)
Calcium: 9.1 mg/dL (ref 8.6–10.2)
Chloride: 96 mmol/L (ref 96–106)
Creatinine, Ser: 1.34 mg/dL — ABNORMAL HIGH (ref 0.76–1.27)
GFR calc non Af Amer: 53 mL/min/{1.73_m2} — ABNORMAL LOW (ref >59)
GFR, EST AFRICAN AMERICAN: 61 mL/min/{1.73_m2} (ref >59)
GLUCOSE: 115 mg/dL — AB (ref 65–99)
Globulin, Total: 2.6 g/dL (ref 1.5–4.5)
Potassium: 4.8 mmol/L (ref 3.5–5.2)
Sodium: 138 mmol/L (ref 134–144)
TOTAL PROTEIN: 6.7 g/dL (ref 6.0–8.5)

## 2015-06-17 NOTE — Progress Notes (Addendum)
   HPI  Patient presents today here for follow-up.  Hospitalization with prostatitis +/- pneumonia and bradycardia @ [facility] They stopped beta blockers, and his heart rate has gotten better.  Diabetes Good medication compliance Has had several low blood sugars lately, some in the 30s, some in the 40s and 50s, frequently 60-80. Taking 30 units of Levemir twice daily, glipizide, and metformin.  He has had some dizziness when standing that last about 20-30 seconds and then resolves. He's also had some bilateral low back pain he describes as annoying type pain that does not limit him. He's worried that Tylenol would cause problems with his other medications.  PMH: Smoking status noted ROS: Per HPI  Objective: BP 132/71 mmHg  Pulse 80  Temp(Src) 97 F (36.1 C) (Oral)  Ht 6\' 1"  (1.854 m)  Wt 272 lb 4.8 oz (123.514 kg)  BMI 35.93 kg/m2 Gen: NAD, alert, cooperative with exam HEENT: NCAT CV: RRR, good S1/S2, no murmur Resp: CTABL, no wheezes, non-labored Ext: No edema, warm Neuro: Alert and oriented, No gross deficits  Assessment and plan:  # Type 2 diabetes Slightly overtreated with hypoglycemia Stop glipizide Continue metformin and Levemir, acarbose Follow-up one month  # Chronic diastolic CHF Appears euvolemic Continue Lasix twice a day, he does seem a little bit orthostatic with his dizziness, however given his recent exacerbation we discussed working through the mild dizziness, we will monitor this for another month.  #Low back pain Most likely musculoskeletal back pain, mild Tylenol   Requesting records from [place], MD [facility] 06/17/2015, 9:33 AM     Addendum review of records from [facility].  Patient was admitted with bradycardia and pneumonia diagnosed by x-ray. Treated with Levaquin and given 7 additional days of Levaquin on discharge. Initial heart rate was 48. ProBNP was 2177 Blood cultures were  negative TTE with ejection fraction of 55-60% and no, and bowel diastolic dysfunction, this was scanned in  Laroy Apple, MD [facility] 06/17/2015, 12:11 PM

## 2015-06-17 NOTE — Patient Instructions (Signed)
Great to see you!  Stop glipizide We may need to decrease your metformin, We will let you know when we call about lab results  I am hoping the dizziness will improve as you get back to normal  Come back in 1 month

## 2015-06-17 NOTE — Patient Outreach (Signed)
3rd Transtion of care call, 2 attempt, no answer. I will call tomorrow.  Deloria Lair [facility] [place] [phone]

## 2015-06-18 ENCOUNTER — Other Ambulatory Visit: Payer: Self-pay | Admitting: *Deleted

## 2015-06-18 ENCOUNTER — Other Ambulatory Visit: Payer: Self-pay | Admitting: Family Medicine

## 2015-06-18 ENCOUNTER — Telehealth: Payer: Self-pay | Admitting: Family Medicine

## 2015-06-18 NOTE — Telephone Encounter (Signed)
Patient aware of results.

## 2015-06-18 NOTE — Patient Outreach (Signed)
3rd attempt to make 3rd transition of care call. No answer at pt home #. 2nd # is at the hospital! I then call pt brother. I told him I have been trying to reach him and he says he has not seen him this week. I asked if he could check on him and he didn't act like he wanted to but he says he may go check on him this afternoon. I asked him if he would ask him to call me.  [name] [facility] [phone]

## 2015-06-18 NOTE — Patient Outreach (Signed)
Pt was contacted by his brother and he called me back. He is doing well. He saw his primary care provider yesterday. His glipizide was discontinued due to hypoglycemic episodes and his glucose levels are well controlled . His FBS today was 102. He also reports his weight is stable at 265 and he has no sxs of CHF exacerbation.   Jacqlyn Larsen, RN, [facility] care manager will be following up with him next week.  Deloria Lair [facility] [phone]

## 2015-06-25 ENCOUNTER — Other Ambulatory Visit: Payer: Self-pay | Admitting: *Deleted

## 2015-06-25 ENCOUNTER — Encounter: Payer: Self-pay | Admitting: *Deleted

## 2015-06-25 NOTE — Patient Outreach (Signed)
[facility]) Care Management   06/25/2015  [name] 1945/08/30 [id number]  ATTHEW COUTANT is an 70 y.o. male  Subjective: Routine home visit with pt, HIPAA verified, pt reports he continues to check his radial pulse and states " it's been good since I come off that medicine", pt reports blood sugars " real good" pt states he is weighing daily but not recording.  Objective:   Filed Vitals:   06/25/15 1139  BP: 126/68  Pulse: 75  Resp: 16  Weight: 264 lb (119.75 kg)  CBG range 112-135 ROS  Physical Exam  Constitutional: He is oriented to person, place, and time. He appears well-developed and well-nourished.  HENT:  Head: Normocephalic.  Neck: Normal range of motion. Neck supple.  Cardiovascular: Normal rate and regular rhythm.   Respiratory: Effort normal and breath sounds normal.  GI: Soft. Bowel sounds are normal.  Musculoskeletal: He exhibits edema.  1+ edema lower left extremity  Neurological: He is alert and oriented to person, place, and time.  Skin: Skin is warm and dry.  Psychiatric: He has a normal mood and affect. His behavior is normal. Judgment and thought content normal.    Encounter Medications:   Outpatient Encounter Prescriptions as of 06/25/2015  Medication Sig Note  . acarbose (PRECOSE) 100 MG tablet TAKE ONE TABLET BY MOUTH TWICE DAILY   . acetaminophen (TYLENOL) 500 MG tablet Take 1,000 mg by mouth every 6 (six) hours as needed (pain).   Marland Kitchen aspirin 325 MG tablet Take 325 mg by mouth daily.   . citalopram (CELEXA) 20 MG tablet Take 1 tablet (20 mg total) by mouth daily.   . furosemide (LASIX) 20 MG tablet Take 2 tablets (40 mg total) by mouth 2 (two) times daily. (Patient taking differently: Take 20 mg by mouth daily. )   . glucose blood test strip Test blood sugar bid. DX E11.9   . insulin detemir (LEVEMIR) 100 UNIT/ML injection Inject 0.3 mLs (30 Units total) into the skin 2 (two) times daily.   Marland Kitchen levothyroxine (LEVOTHROID) 25 MCG  tablet Take 1 tablet (25 mcg total) by mouth daily before breakfast.   . levothyroxine (SYNTHROID, LEVOTHROID) 200 MCG tablet Take 1 tablet (200 mcg total) by mouth daily before breakfast. (Patient taking differently: Take 200 mcg by mouth daily before breakfast. Take with a 125 mcg tablet for a 325 mcg dose)   . metFORMIN (GLUCOPHAGE) 1000 MG tablet Take 1 tablet (1,000 mg total) by mouth 2 (two) times daily with a meal.   . Multiple Vitamins-Minerals (MENS MULTI VITAMIN & MINERAL PO) Take 1 tablet by mouth daily.     . Omega-3 Fatty Acids (FISH OIL) 1200 MG CAPS Take 1,200-2,400 mg by mouth 2 (two) times daily. Take 1 capsule (1200 mg) by mouth daily at noon and 2 capsules (2400 mg) daily at bedtime   . potassium chloride SA (K-DUR,KLOR-CON) 20 MEQ tablet Take 1 tablet by mouth 2 (two) times daily. 06/25/2015: Pt taking once daily as per prescription bottle  . saccharomyces boulardii (FLORASTOR) 250 MG capsule Take 1 capsule (250 mg total) by mouth 2 (two) times daily.   . simvastatin (ZOCOR) 80 MG tablet Take 40 mg by mouth at bedtime.    . tamsulosin (FLOMAX) 0.4 MG CAPS capsule Take 0.4 mg by mouth at bedtime.   . VENTOLIN HFA 108 (90 Base) MCG/ACT inhaler Inhale 1-2 puffs into the lungs daily as needed. Reported on 06/25/2015   . [DISCONTINUED] ciprofloxacin (CIPRO) 500 MG  tablet Take 1 tablet (500 mg total) by mouth 2 (two) times daily. (Patient not taking: Reported on 06/25/2015)   . [DISCONTINUED] levothyroxine (SYNTHROID, LEVOTHROID) 125 MCG tablet Take 125 mcg by mouth daily before breakfast. Reported on 06/04/2015   . [DISCONTINUED] metoprolol tartrate (LOPRESSOR) 25 MG tablet TAKE ONE-HALF TABLET BY MOUTH TWICE DAILY    Facility-Administered Encounter Medications as of 06/25/2015  Medication  . cyanocobalamin ((VITAMIN B-12)) injection 1,000 mcg    Functional Status:   In your present state of health, do you have any difficulty performing the following activities: 05/29/2015 05/24/2015   Hearing? Y N  Vision? N N  Difficulty concentrating or making decisions? N N  Walking or climbing stairs? N N  Dressing or bathing? N N  Doing errands, shopping? N N  Preparing Food and eating ? N -  Using the Toilet? N -  In the past six months, have you accidently leaked urine? N -  Do you have problems with loss of bowel control? N -  Managing your Medications? N -  Managing your Finances? N -  Housekeeping or managing your Housekeeping? N -    Fall/Depression Screening:    PHQ 2/9 Scores 06/17/2015 05/29/2015 03/25/2015 03/02/2015 01/13/2015 01/06/2015 12/01/2014  PHQ - 2 Score 0 0 0 0 0 0 0  PHQ- 9 Score - - - - - - -    Assessment:  Pt goes out to eat a lot, RN CM reinforced low sodium diet. Pt trying to make positive improvements to health, such as beginning to weigh daily.  RN CM talked with pt about discharge plan for next month and will consider transfer to RN health coach if pt agreeable.  THN CM Care Plan Problem One        Most Recent Value   Care Plan Problem One  Knowledge deficit related to CHF   Role Documenting the Problem One  Care Management Coordinator   Care Plan for Problem One  Active   THN Long Term Goal (31-90 days)  pt will have no admissions realted to heart failure within 90 days   THN Long Term Goal Start Date  05/26/15   Interventions for Problem One Long Term Goal  RN CM reminded pt to call MD early for change in health status, symptoms   THN CM Short Term Goal #1 Met Date  -- [pt has appointment with primary MD 06/15/15]   THN CM Short Term Goal #2 (0-30 days)  pt will weigh daily and record within 30 days   THN CM Short Term Goal #2 Start Date  06/25/15 Barrie Folk restarted- pt weighing, not recording]   Interventions for Short Term Goal #2  RN CM praised and encouraged pt for setting up scales and weighing daily, RN CM ask pt to start recording weight daily   THN CM Short Term Goal #3 (0-30 days)  pt will verbalize CHF zones/ action plan within 30 days.    THN CM Short Term Goal #3 Start Date  06/25/15 [goal restarted- pt needs reinforcement]   Interventions for Short Tern Goal #3  RN CM reviewed CHF action plan with pt and importance of being mindful of how he feels each day      Plan: follow up with home visit 07/30/15 Assess weight, CBG Discharge from community/ transfer to health coach if pt agreeable  Jacqlyn Larsen [facility], [place] [phone]

## 2015-06-30 ENCOUNTER — Other Ambulatory Visit: Payer: Self-pay | Admitting: Pediatrics

## 2015-07-01 NOTE — Telephone Encounter (Signed)
See last TSH labs, [name] pt

## 2015-07-01 NOTE — Telephone Encounter (Signed)
Please review and advise.

## 2015-07-02 ENCOUNTER — Telehealth: Payer: Self-pay | Admitting: Family Medicine

## 2015-07-02 NOTE — Telephone Encounter (Signed)
Patient aware and understanding.

## 2015-07-02 NOTE — Telephone Encounter (Signed)
Blood sugar less than 200 is not too concearning acutely. Please check routinely and write it down. We may need to may long term medicine changes but will net have to do that for CBG of 175.   Laroy Apple, MD [facility] 07/02/2015, 4:50 PM

## 2015-07-02 NOTE — Telephone Encounter (Signed)
States his BS this AM was 175. It usually runs in the 130s. He is concerned about it. Please advise and route to pool A

## 2015-07-06 ENCOUNTER — Telehealth: Payer: Self-pay | Admitting: Family Medicine

## 2015-07-06 NOTE — Telephone Encounter (Signed)
PT needs to be seen

## 2015-07-07 DIAGNOSIS — Z87440 Personal history of urinary (tract) infections: Secondary | ICD-10-CM | POA: Diagnosis not present

## 2015-07-07 DIAGNOSIS — J449 Chronic obstructive pulmonary disease, unspecified: Secondary | ICD-10-CM | POA: Diagnosis not present

## 2015-07-07 DIAGNOSIS — E1165 Type 2 diabetes mellitus with hyperglycemia: Secondary | ICD-10-CM | POA: Diagnosis not present

## 2015-07-07 DIAGNOSIS — N390 Urinary tract infection, site not specified: Secondary | ICD-10-CM | POA: Diagnosis not present

## 2015-07-07 DIAGNOSIS — Z794 Long term (current) use of insulin: Secondary | ICD-10-CM | POA: Diagnosis not present

## 2015-07-07 DIAGNOSIS — I509 Heart failure, unspecified: Secondary | ICD-10-CM | POA: Diagnosis not present

## 2015-07-07 DIAGNOSIS — Z79899 Other long term (current) drug therapy: Secondary | ICD-10-CM | POA: Diagnosis not present

## 2015-07-07 DIAGNOSIS — N3001 Acute cystitis with hematuria: Secondary | ICD-10-CM | POA: Diagnosis not present

## 2015-07-16 NOTE — Telephone Encounter (Signed)
Detailed message left for patient that if he still needs to speak with [name] to please call us back.

## 2015-07-24 ENCOUNTER — Other Ambulatory Visit: Payer: Self-pay | Admitting: Nurse Practitioner

## 2015-07-30 ENCOUNTER — Telehealth: Payer: Self-pay | Admitting: Cardiology

## 2015-07-30 ENCOUNTER — Other Ambulatory Visit: Payer: Self-pay | Admitting: *Deleted

## 2015-07-30 NOTE — Patient Outreach (Signed)
07/30/15- RN CM drove to patient's home for scheduled home visit, no answer to locked door and no answer to telephone.  RN CM attempted several times to reach by telephone.  PLAN Attempt to reach pt by telephone next week  Jacqlyn Larsen [facility], [place] Coordinator [phone]

## 2015-07-30 NOTE — Telephone Encounter (Signed)
F/u Message  Pt states he received a call, no VM. Please call back to discuss

## 2015-07-30 NOTE — Telephone Encounter (Signed)
Returned call to patient.He stated he noticed on his phone heart care called him, but did not leave a message.After reviewing chart I cannot see who called you.Advised I will send message to Dr.Hochrein's CMA [place].

## 2015-07-31 ENCOUNTER — Ambulatory Visit (INDEPENDENT_AMBULATORY_CARE_PROVIDER_SITE_OTHER): Payer: Medicare Other | Admitting: Physician Assistant

## 2015-07-31 ENCOUNTER — Encounter: Payer: Self-pay | Admitting: Physician Assistant

## 2015-07-31 VITALS — BP 136/69 | HR 106 | Temp 97.2°F | Ht 73.0 in | Wt 271.0 lb

## 2015-07-31 DIAGNOSIS — R300 Dysuria: Secondary | ICD-10-CM | POA: Diagnosis not present

## 2015-07-31 LAB — URINALYSIS, COMPLETE
Bilirubin, UA: NEGATIVE
Nitrite, UA: NEGATIVE
PH UA: 5.5 (ref 5.0–7.5)
Protein, UA: NEGATIVE
Specific Gravity, UA: 1.01 (ref 1.005–1.030)
Urobilinogen, Ur: 0.2 mg/dL (ref 0.2–1.0)

## 2015-07-31 LAB — MICROSCOPIC EXAMINATION: WBC, UA: >30 /hpf — AB (ref 0–?)

## 2015-07-31 MED ORDER — SULFAMETHOXAZOLE-TRIMETHOPRIM 800-160 MG PO TABS: 1.0000 | ORAL_TABLET | Freq: Two times a day (BID) | ORAL | Status: DC

## 2015-07-31 NOTE — Patient Instructions (Signed)

## 2015-07-31 NOTE — Progress Notes (Signed)
Subjective:     Patient ID: [name], male   DOB: 12-14-1945, 70 y.o.   MRN: [id number]  HPI Pt with hx of IDDM and multiple UTI due to poor glucose control Here with dysuria today for several days  Review of Systems  Constitutional: Negative.   Genitourinary: Positive for dysuria and frequency. Negative for flank pain, enuresis and difficulty urinating.       Objective:   Physical Exam  Constitutional: He appears well-developed and well-nourished.  Abdominal: Soft. Bowel sounds are normal. He exhibits no distension and no mass. There is no tenderness. There is no rebound and no guarding.  No CVAT  Nursing note and vitals reviewed. UA- see labs, + glucose,+ leuk     Assessment:     Dysuria    Plan:     Bactrim DS 1 po bid # 20 Fluids Pt to f/u with regular provider regarding diabetes F/U  Regarding urine prn

## 2015-08-03 ENCOUNTER — Other Ambulatory Visit: Payer: Self-pay | Admitting: *Deleted

## 2015-08-03 NOTE — Patient Outreach (Signed)
08/03/15- Telephone call to pt for care coordination follow up after pt was not home for scheduled home visit on 07/30/15.  No answer to telephone and no option to leave voicemail.  PLAN Attempt to reach pt next week- if unsuccessful- mail letter to pt.  Jacqlyn Larsen [facility], [place] Coordinator [phone]

## 2015-08-03 NOTE — Telephone Encounter (Signed)
I didn't call pt, not sure who did

## 2015-08-12 DIAGNOSIS — E1151 Type 2 diabetes mellitus with diabetic peripheral angiopathy without gangrene: Secondary | ICD-10-CM | POA: Diagnosis not present

## 2015-08-12 DIAGNOSIS — E1140 Type 2 diabetes mellitus with diabetic neuropathy, unspecified: Secondary | ICD-10-CM | POA: Diagnosis not present

## 2015-08-12 DIAGNOSIS — L110 Acquired keratosis follicularis: Secondary | ICD-10-CM | POA: Diagnosis not present

## 2015-08-12 DIAGNOSIS — L609 Nail disorder, unspecified: Secondary | ICD-10-CM | POA: Diagnosis not present

## 2015-08-13 ENCOUNTER — Other Ambulatory Visit: Payer: Self-pay | Admitting: *Deleted

## 2015-08-13 ENCOUNTER — Telehealth: Payer: Self-pay | Admitting: Family Medicine

## 2015-08-13 ENCOUNTER — Encounter: Payer: Self-pay | Admitting: *Deleted

## 2015-08-13 NOTE — Telephone Encounter (Signed)
Pt aware and will call back to schedule an appt for next week.

## 2015-08-13 NOTE — Patient Outreach (Signed)
08/13/15- Telephone call to pt for outreach (3rd attempt), no answer to telephone and no option to leave voicemail.  RN CM mailed letter to pt requesting return phone call, will keep case open for 10 days.  Jacqlyn Larsen [facility], [place] Coordinator [phone]

## 2015-08-13 NOTE — Telephone Encounter (Signed)
Ok to increase to 34 units (give 2 additional units now) but probably needs to be seen as well.   Laroy Apple, MD [facility] 08/13/2015, 12:47 PM

## 2015-08-13 NOTE — Telephone Encounter (Signed)
Pt states his fasting BG was 336 and he is taking his insulin and metformin. Pt even states he is taking 32 units of insulin instead of 30. Please advise.

## 2015-08-16 NOTE — Progress Notes (Signed)
HPI The patient presents for followup of aortic valve replacement.  He was in the hospital at [facility] earlier this year.  He had bradycardia and was taken off of beta blockers.  He said that he had been light headed and presyncopal.   He denies any ongoing symptoms.  He has lost another 15 lbs and is down over 60 lbs from his peak a few years ago.  The patient denies any new symptoms such as chest discomfort, neck or arm discomfort. There has been no new shortness of breath, PND or orthopnea. There have been no reported palpitations, presyncope or syncope.  He is going to have circumcision   Allergies  Allergen Reactions  . Phenothiazines Anaphylaxis  . Actos [Pioglitazone] Swelling    Current Outpatient Prescriptions  Medication Sig Dispense Refill  . acarbose (PRECOSE) 100 MG tablet TAKE ONE TABLET BY MOUTH TWICE DAILY 180 tablet 0  . acetaminophen (TYLENOL) 500 MG tablet Take 1,000 mg by mouth every 6 (six) hours as needed (pain).    Marland Kitchen aspirin 325 MG tablet Take 325 mg by mouth daily.    . citalopram (CELEXA) 20 MG tablet Take 1 tablet (20 mg total) by mouth daily. 30 tablet 0  . furosemide (LASIX) 20 MG tablet Take 2 tablets (40 mg total) by mouth 2 (two) times daily. (Patient taking differently: Take 20 mg by mouth daily. ) 30 tablet 4  . glucose blood test strip Test blood sugar bid. DX E11.9 100 each 5  . insulin detemir (LEVEMIR) 100 UNIT/ML injection Inject 0.3 mLs (30 Units total) into the skin 2 (two) times daily. 60 mL 11  . levothyroxine (SYNTHROID, LEVOTHROID) 200 MCG tablet Take 1 tablet (200 mcg total) by mouth daily before breakfast. (Patient taking differently: Take 200 mcg by mouth daily before breakfast. Take with a 125 mcg tablet for a 325 mcg dose) 90 tablet 3  . levothyroxine (SYNTHROID, LEVOTHROID) 25 MCG tablet TAKE 1 TABLET BY MOUTH EVERY DAY BEFORE BREAKFAST 90 tablet 0  . metFORMIN (GLUCOPHAGE) 1000 MG tablet TAKE 1 TABLET BY MOUTH TWICE DAILY WITH A MEAL. 180  tablet 0  . Multiple Vitamins-Minerals (MENS MULTI VITAMIN & MINERAL PO) Take 1 tablet by mouth daily.      . Omega-3 Fatty Acids (FISH OIL) 1200 MG CAPS Take 1,200-2,400 mg by mouth 2 (two) times daily. Take 1 capsule (1200 mg) by mouth daily at noon and 2 capsules (2400 mg) daily at bedtime    . potassium chloride SA (K-DUR,KLOR-CON) 20 MEQ tablet Take 1 tablet by mouth 2 (two) times daily.    Marland Kitchen saccharomyces boulardii (FLORASTOR) 250 MG capsule Take 1 capsule (250 mg total) by mouth 2 (two) times daily. 60 capsule 1  . simvastatin (ZOCOR) 80 MG tablet Take 40 mg by mouth at bedtime.     . sulfamethoxazole-trimethoprim (BACTRIM DS,SEPTRA DS) 800-160 MG tablet Take 1 tablet by mouth 2 (two) times daily. 20 tablet 0  . tamsulosin (FLOMAX) 0.4 MG CAPS capsule Take 0.4 mg by mouth at bedtime.    . VENTOLIN HFA 108 (90 Base) MCG/ACT inhaler Inhale 1-2 puffs into the lungs daily as needed. Reported on 06/25/2015     Current Facility-Administered Medications  Medication Dose Route Frequency Provider Last Rate Last Dose  . cyanocobalamin ((VITAMIN B-12)) injection 1,000 mcg  1,000 mcg Intramuscular Q30 days Claretta Fraise, MD   1,000 mcg at 12/23/14 1110    Past Medical History:  Diagnosis Date  . Arthritis   .  BPH (benign prostatic hypertrophy) 04/15/2010  . Colon polyps   . DEPRESSION 01/22/2009  . Heart valve replaced   . HYPERCHOLESTEROLEMIA 01/22/2009  . HYPERTENSION 01/22/2009   Dr. [name]  . 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 Morbid obesity ([place])   . Tremors of nervous system    ?ptsd  . Ulcer     Past Surgical History:  Procedure Laterality Date  . 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 APPENDECTOMY    . bilateral cataract surg    . COLONOSCOPY  04/24/2007   [name]: normal  . COLONOSCOPY WITH ESOPHAGOGASTRODUODENOSCOPY (EGD) N/A 04/05/2012   Procedure: COLONOSCOPY WITH ESOPHAGOGASTRODUODENOSCOPY (EGD);   Surgeon: Daneil Dolin, MD;  [facility];  Service: Endoscopy;  Laterality: N/A;  10;15  . DENTAL SURGERY  05/2004   Dental extractions  . HEEL SPUR SURGERY Bilateral    resection of heel spur  . KNEE ARTHROSCOPY WITH LATERAL MENISECTOMY Right 04/04/2014   Procedure: KNEE ARTHROSCOPY WITH LATERAL MENISECTOMY;  Surgeon: Carole Civil, MD;  Location: [facility];  Service: Orthopedics;  Laterality: Right;  . THYROIDECTOMY  03/21/2013   DR [name]  . THYROIDECTOMY N/A 03/21/2013   Procedure: THYROIDECTOMY;  Surgeon: [name], MD;  Location: [place];  Service: General;  Laterality: N/A;  . TONSILLECTOMY     ROS:   As stated in the HPI and negative for all other systems.  PHYSICAL EXAM BP (!) 152/77 (BP Location: Right Arm, Patient Position: Sitting, Cuff Size: Large)   Pulse 87   Ht 6\' 1"  (1.854 m)   Wt 269 lb (122 kg)   SpO2 98%   BMI 35.49 kg/m  GENERAL:  No acute distress NECK:  No jugular venous distention, waveform within normal limits, carotid upstroke brisk and symmetric, no bruits, no thyromegaly LUNGS:  Clear to auscultation bilaterally CHEST:  Well healed sternotomy scar. HEART:  PMI not displaced or sustained,S1 and S2 within normal limits, no S3, no S4 no clicks, no rubs, systolic murmur mid peaking and  radiating into the carotids  ABD:  Flat, positive bowel sounds normal in frequency in pitch, no bruits, no rebound, no guarding, no midline pulsatile mass, no hepatomegaly, no splenomegaly, obese EXT:  2 plus pulses throughout, moderate edema, no cyanosis no clubbing NEURO:  Nonfocal   EKG:   None  Lab Results  Component Value Date   HGBA1C 7.3 03/25/2015   Lab Results  Component Value Date   CHOL 129 12/01/2014   TRIG 152 (H) 12/01/2014   HDL 38 (L) 12/01/2014   LDLCALC 61 12/01/2014    ASSESSMENT AND PLAN  CHRONIC SYSTOLIC AND DIASTOLIC HF:   He seems to be euvolemic.  No change in therapy is planned at this point.   AORTIC VALVE REPLACEMENT,  HX OF - He had stable valve prosthesis in the hospital on echocardiography in May at [place].  I was able to see this report.  No change in therapy is indicated.  I did review the echo report from this hospitalization.  He did appear to have a moderate gradient but no indication for TEE  HYPERTENSION -  His blood pressure is slightly high today. He will continue the meds as listed.  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  He missed a scheduled follow up.  BRADYCARDIA - He will remain off of the beta blockers.  I will  obtain the [place] records.    ATRIAL FIB - I have reviewed the previous hospital records. He was said to be in atrial fibrillation not started on anticoagulation. The EKGs are very difficult to evaluate. There may be one with atrial fibrillation. Tracings are poor quality.  He was not started on anticoagulation but this was deferred to follow-up. I'm going to place a 48-hour Holter. I'll see him back after this is done and consider whether he needs to be on Xarelto which she took previously  CAROTID STENOSIS - He is overdue for follow-up.  PREOP - He is at acceptable risk for the planned procedure. If necessary he can stop all of his anticoagulation for a short period.

## 2015-08-17 ENCOUNTER — Ambulatory Visit (INDEPENDENT_AMBULATORY_CARE_PROVIDER_SITE_OTHER): Payer: Medicare Other | Admitting: Cardiology

## 2015-08-17 ENCOUNTER — Encounter: Payer: Self-pay | Admitting: Cardiology

## 2015-08-17 ENCOUNTER — Telehealth: Payer: Self-pay | Admitting: Cardiology

## 2015-08-17 ENCOUNTER — Ambulatory Visit (HOSPITAL_COMMUNITY)
Admission: RE | Admit: 2015-08-17 | Discharge: 2015-08-17 | Disposition: A | Discharge status: Home / Self Care | Payer: Medicare Other | Source: Ambulatory Visit | Attending: Cardiovascular Disease | Admitting: Cardiovascular Disease

## 2015-08-17 VITALS — BP 152/77 | HR 87 | Ht 73.0 in | Wt 269.0 lb

## 2015-08-17 DIAGNOSIS — E119 Type 2 diabetes mellitus without complications: Secondary | ICD-10-CM | POA: Insufficient documentation

## 2015-08-17 DIAGNOSIS — I480 Paroxysmal atrial fibrillation: Secondary | ICD-10-CM

## 2015-08-17 DIAGNOSIS — R0602 Shortness of breath: Secondary | ICD-10-CM

## 2015-08-17 DIAGNOSIS — E785 Hyperlipidemia, unspecified: Secondary | ICD-10-CM | POA: Insufficient documentation

## 2015-08-17 DIAGNOSIS — I10 Essential (primary) hypertension: Secondary | ICD-10-CM | POA: Diagnosis not present

## 2015-08-17 DIAGNOSIS — I359 Nonrheumatic aortic valve disorder, unspecified: Secondary | ICD-10-CM

## 2015-08-17 DIAGNOSIS — Z951 Presence of aortocoronary bypass graft: Secondary | ICD-10-CM | POA: Insufficient documentation

## 2015-08-17 DIAGNOSIS — I6523 Occlusion and stenosis of bilateral carotid arteries: Secondary | ICD-10-CM | POA: Diagnosis not present

## 2015-08-17 DIAGNOSIS — I739 Peripheral vascular disease, unspecified: Principal | ICD-10-CM

## 2015-08-17 DIAGNOSIS — I779 Disorder of arteries and arterioles, unspecified: Secondary | ICD-10-CM

## 2015-08-17 NOTE — Telephone Encounter (Signed)
Faxed Records Release to [facility] to obtain records from [facility] per Dr Hochrein's request

## 2015-08-17 NOTE — Telephone Encounter (Signed)
Records received from [facility] as requested by Dr [name].  Records given to Dr [name] for review. lp

## 2015-08-17 NOTE — Patient Instructions (Signed)
Medication Instructions:  Continue current medications  Labwork: NONE  Testing/Procedures: Your physician has recommended that you wear a 48 hour holter monitor. Holter monitors are medical devices that record the heart's electrical activity. Doctors most often use these monitors to diagnose arrhythmias. Arrhythmias are problems with the speed or rhythm of the heartbeat. The monitor is a small, portable device. You can wear one while you do your normal daily activities. This is usually used to diagnose what is causing palpitations/syncope (passing out).  Your physician has requested that you have a carotid duplex. This test is an ultrasound of the carotid arteries in your neck. It looks at blood flow through these arteries that supply the brain with blood. Allow one hour for this exam. There are no restrictions or special instructions.  Follow-Up: Your physician recommends that you schedule a follow-up appointment in: [facility]   Any Other Special Instructions Will Be Listed Below (If Applicable).   If you need a refill on your cardiac medications before your next appointment, please call your pharmacy.

## 2015-08-20 ENCOUNTER — Ambulatory Visit (INDEPENDENT_AMBULATORY_CARE_PROVIDER_SITE_OTHER): Payer: Medicare Other

## 2015-08-20 DIAGNOSIS — I480 Paroxysmal atrial fibrillation: Secondary | ICD-10-CM | POA: Diagnosis not present

## 2015-08-24 ENCOUNTER — Encounter: Payer: Self-pay | Admitting: *Deleted

## 2015-08-24 ENCOUNTER — Other Ambulatory Visit: Payer: Self-pay | Admitting: *Deleted

## 2015-08-24 ENCOUNTER — Telehealth: Payer: Self-pay | Admitting: *Deleted

## 2015-08-24 DIAGNOSIS — I739 Peripheral vascular disease, unspecified: Principal | ICD-10-CM

## 2015-08-24 DIAGNOSIS — I779 Disorder of arteries and arterioles, unspecified: Secondary | ICD-10-CM

## 2015-08-24 NOTE — Patient Outreach (Signed)
08/24/15-Case closed due to unable to maintain contact with pt, faxed case closure letter to primary provider Ronnald Collum, mailed case closure letter to pt home.  Jacqlyn Larsen [facility], [place] Coordinator [phone]

## 2015-08-24 NOTE — Telephone Encounter (Signed)
-----   Message from Minus Breeding, MD sent at 08/21/2015  1:51 PM EDT ----- This is mild.  Follow up in two years.  Call Mr. Hellwege with the results and send results to Kenn File, MD

## 2015-08-24 NOTE — Telephone Encounter (Signed)
Carotid doppler order for 2 years, pt is aware of his doppler result and to recheck in 2 years.

## 2015-09-03 NOTE — Progress Notes (Signed)
HPI The patient presents for followup of aortic valve replacement.  He was in the hospital at [facility] earlier this year.  He had  bradycardia and was taken off of beta blockers.   There was a question of atrial fib which was not confirmed by the EKGs.  I placed a 48 Holter and he was not found to have atrial fib on this. At the last visit he was preop for a circumcision.  He did not have this done.  He returns for follow up is monitor did demonstrate some episodes of wide complex regular rhythm that could've been aberrancy or possibly an idioventricular rhythm. He was not having significant symptoms with this. He describes occasional palpitations. He's not had any presyncope or syncope. He says that with activity he is actually breathing better he's trying to walk most days. He's not having any jaw pressure, neck or arm discomfort. He's had no weight gain or edema.   Allergies  Allergen Reactions  . Phenothiazines Anaphylaxis  . Actos [Pioglitazone] Swelling    Current Outpatient Prescriptions  Medication Sig Dispense Refill  . acarbose (PRECOSE) 100 MG tablet TAKE ONE TABLET BY MOUTH TWICE DAILY 180 tablet 0  . acetaminophen (TYLENOL) 500 MG tablet Take 1,000 mg by mouth every 6 (six) hours as needed (pain).    Marland Kitchen aspirin 325 MG tablet Take 325 mg by mouth daily.    . citalopram (CELEXA) 20 MG tablet Take 1 tablet (20 mg total) by mouth daily. 30 tablet 0  . furosemide (LASIX) 20 MG tablet Take 2 tablets (40 mg total) by mouth 2 (two) times daily. (Patient taking differently: Take 20 mg by mouth daily. ) 30 tablet 4  . glucose blood test strip Test blood sugar bid. DX E11.9 100 each 5  . insulin detemir (LEVEMIR) 100 UNIT/ML injection Inject 0.3 mLs (30 Units total) into the skin 2 (two) times daily. 60 mL 11  . levothyroxine (SYNTHROID, LEVOTHROID) 200 MCG tablet Take 1 tablet (200 mcg total) by mouth daily before breakfast. (Patient taking differently: Take 200 mcg by mouth daily before  breakfast. Take with a 125 mcg tablet for a 325 mcg dose) 90 tablet 3  . metFORMIN (GLUCOPHAGE) 1000 MG tablet TAKE 1 TABLET BY MOUTH TWICE DAILY WITH A MEAL. 180 tablet 0  . Multiple Vitamins-Minerals (MENS MULTI VITAMIN & MINERAL PO) Take 1 tablet by mouth daily.      . Omega-3 Fatty Acids (FISH OIL) 1200 MG CAPS Take 1,200-2,400 mg by mouth 2 (two) times daily. Take 1 capsule (1200 mg) by mouth daily at noon and 2 capsules (2400 mg) daily at bedtime    . potassium chloride SA (K-DUR,KLOR-CON) 20 MEQ tablet Take 1 tablet by mouth 2 (two) times daily.    Marland Kitchen saccharomyces boulardii (FLORASTOR) 250 MG capsule Take 1 capsule (250 mg total) by mouth 2 (two) times daily. 60 capsule 1  . simvastatin (ZOCOR) 80 MG tablet Take 40 mg by mouth at bedtime.     . sulfamethoxazole-trimethoprim (BACTRIM DS,SEPTRA DS) 800-160 MG tablet Take 1 tablet by mouth 2 (two) times daily. 20 tablet 0  . tamsulosin (FLOMAX) 0.4 MG CAPS capsule Take 0.4 mg by mouth at bedtime.    . VENTOLIN HFA 108 (90 Base) MCG/ACT inhaler Inhale 1-2 puffs into the lungs daily as needed. Reported on 06/25/2015     Current Facility-Administered Medications  Medication Dose Route Frequency Provider Last Rate Last Dose  . cyanocobalamin ((VITAMIN B-12)) injection 1,000 mcg  1,000  mcg Intramuscular Q30 days Claretta Fraise, MD   1,000 mcg at 12/23/14 1110    Past Medical History:  Diagnosis Date  . Arthritis   . BPH (benign prostatic hypertrophy) 04/15/2010  . Colon polyps   . DEPRESSION 01/22/2009  . Heart valve replaced   . HYPERCHOLESTEROLEMIA 01/22/2009  . HYPERTENSION 01/22/2009   Dr. [name]  . 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 Morbid obesity ([place])   . Tremors of nervous system    ?ptsd  . Ulcer     Past Surgical History:  Procedure Laterality Date  . 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 APPENDECTOMY    . bilateral cataract surg    . COLONOSCOPY   04/24/2007   [name]: normal  . COLONOSCOPY WITH ESOPHAGOGASTRODUODENOSCOPY (EGD) N/A 04/05/2012   Procedure: COLONOSCOPY WITH ESOPHAGOGASTRODUODENOSCOPY (EGD);  Surgeon: Daneil Dolin, MD;  [facility];  Service: Endoscopy;  Laterality: N/A;  10;15  . DENTAL SURGERY  05/2004   Dental extractions  . HEEL SPUR SURGERY Bilateral    resection of heel spur  . KNEE ARTHROSCOPY WITH LATERAL MENISECTOMY Right 04/04/2014   Procedure: KNEE ARTHROSCOPY WITH LATERAL MENISECTOMY;  Surgeon: Carole Civil, MD;  Location: [facility];  Service: Orthopedics;  Laterality: Right;  . THYROIDECTOMY  03/21/2013   DR [name]  . THYROIDECTOMY N/A 03/21/2013   Procedure: THYROIDECTOMY;  Surgeon: [name], MD;  Location: [place];  Service: General;  Laterality: N/A;  . TONSILLECTOMY     ROS:   Positive for mild orthostatic symptoms. Otherwise as stated in the HPI and negative for all other systems.  PHYSICAL EXAM BP 130/80   Pulse 72   Ht 6\' 1"  (1.854 m)   Wt 265 lb (120.2 kg)   SpO2 97%   BMI 34.96 kg/m  GENERAL:  No acute distress NECK:  No jugular venous distention, waveform within normal limits, carotid upstroke brisk and symmetric, no bruits, no thyromegaly LUNGS:  Clear to auscultation bilaterally CHEST:  Well healed sternotomy scar. HEART:  PMI not displaced or sustained,S1 and S2 within normal limits, no S3, no S4 no clicks, no rubs, systolic murmur mid peaking and  radiating into the carotids  ABD:  Flat, positive bowel sounds normal in frequency in pitch, no bruits, no rebound, no guarding, no midline pulsatile mass, no hepatomegaly, no splenomegaly, obese EXT:  2 plus pulses throughout, moderate edema, no cyanosis no clubbing NEURO:  Nonfocal    Lab Results  Component Value Date   HGBA1C 7.3 03/25/2015   Lab Results  Component Value Date   CHOL 129 12/01/2014   TRIG 152 (H) 12/01/2014   HDL 38 (L) 12/01/2014   LDLCALC 61 12/01/2014    ASSESSMENT AND PLAN  DYSPNEA:     Given this complaint and his rhythm as described I would like to screen him with a stress test.  He would not be able to walk on a treadmill and will have a Lexiscan Myoview.  CHRONIC SYSTOLIC AND DIASTOLIC HF:   He seems to be euvolemic.  I will evaluate as above.   AORTIC VALVE REPLACEMENT, HX OF - He had stable valve prosthesis in the hospital on echocardiography in May at [place]. Marland Kitchen  He did appear to have a moderate gradient but no indication for TEE  HYPERTENSION -  His blood pressure is at target . He will continue the meds as listed.  CAROTID OCCLUSIVE DISEASE -  Carotid Doppler in July demonstrated only mild coronary plaque which will be followed again in two years.  BRADYCARDIA - He will remain off of the beta blockers.    ATRIAL FIB - I carefully the records from [facility] and could not identify atrial fibrillation and there was not on her Holter. Therefore, I will not commit him to anticoagulation.   ARRHYTHMIA- He did have some wide complex probably junctional or possibly idioventricular rhythm. He has normal LV function. I will follow-up with stress perfusion testing.

## 2015-09-04 ENCOUNTER — Encounter: Payer: Self-pay | Admitting: *Deleted

## 2015-09-04 ENCOUNTER — Ambulatory Visit (INDEPENDENT_AMBULATORY_CARE_PROVIDER_SITE_OTHER): Payer: Medicare Other | Admitting: Cardiology

## 2015-09-04 ENCOUNTER — Encounter: Payer: Self-pay | Admitting: Cardiology

## 2015-09-04 VITALS — BP 130/80 | HR 72 | Ht 73.0 in | Wt 265.0 lb

## 2015-09-04 DIAGNOSIS — I498 Other specified cardiac arrhythmias: Secondary | ICD-10-CM | POA: Diagnosis not present

## 2015-09-04 DIAGNOSIS — R0602 Shortness of breath: Secondary | ICD-10-CM | POA: Diagnosis not present

## 2015-09-04 NOTE — Patient Instructions (Signed)
Medication Instructions:   Your physician recommends that you continue on your current medications as directed. Please refer to the Current Medication list given to you today.  Labwork:  NONE  Testing/Procedures: Your physician has requested that you have a lexiscan myoview. For further information please visit [email or web address]. Please follow instruction sheet, as given.  Follow-Up:  Your physician recommends that you schedule a follow-up appointment in: 6 months. You will receive a reminder letter in the mail in about 4 months reminding you to call and schedule your appointment. If you don't receive this letter, please contact our office.  Any Other Special Instructions Will Be Listed Below (If Applicable).  If you need a refill on your cardiac medications before your next appointment, please call your pharmacy. 

## 2015-09-21 ENCOUNTER — Ambulatory Visit: Payer: Medicare Other | Admitting: Family Medicine

## 2015-10-02 ENCOUNTER — Encounter: Payer: Self-pay | Admitting: Family Medicine

## 2015-10-02 ENCOUNTER — Telehealth: Payer: Self-pay | Admitting: Family Medicine

## 2015-10-02 ENCOUNTER — Ambulatory Visit (INDEPENDENT_AMBULATORY_CARE_PROVIDER_SITE_OTHER): Payer: Medicare Other | Admitting: Family Medicine

## 2015-10-02 VITALS — BP 136/72 | HR 78 | Temp 97.6°F | Ht 73.0 in | Wt 267.6 lb

## 2015-10-02 DIAGNOSIS — E119 Type 2 diabetes mellitus without complications: Secondary | ICD-10-CM | POA: Diagnosis not present

## 2015-10-02 DIAGNOSIS — I10 Essential (primary) hypertension: Secondary | ICD-10-CM | POA: Diagnosis not present

## 2015-10-02 DIAGNOSIS — R300 Dysuria: Secondary | ICD-10-CM | POA: Diagnosis not present

## 2015-10-02 DIAGNOSIS — Z23 Encounter for immunization: Secondary | ICD-10-CM | POA: Diagnosis not present

## 2015-10-02 LAB — BAYER DCA HB A1C WAIVED: HB A1C (BAYER DCA - WAIVED): 10.6 % — ABNORMAL HIGH (ref <7.0)

## 2015-10-02 NOTE — Telephone Encounter (Signed)
Patient aware lab work needs to be reviewed by provider and we will call with recommendations.

## 2015-10-02 NOTE — Progress Notes (Signed)
   HPI  Patient presents today here for follow-up chronic medical conditions.  Type 2 diabetes Average fasting blood sugar has been 175-225 No hypoglycemia Patient has stopped glipizide as discussed. 35 units of Levemir twice daily.  Hypertension Not checking at home, at the drugstore occasionally it's 140-150 No chest pain, dyspnea, palpitations, leg edema.  Patient states that when he gets sweaty he feels weak in the legs and arms that this resolves after a few minutes of rest.   He would like a pneumonia shot today.  Describes mild dysuria once or twice a month, request urine test today. No fevers, chills, sweats, abdominal pain, or new back pain.   PMH: Smoking status noted ROS: Per HPI  Objective: BP 136/72   Pulse 78   Temp 97.6 F (36.4 C) (Oral)   Ht 6' 1" (1.854 m)   Wt 267 lb 9.6 oz (121.4 kg)   BMI 35.31 kg/m  Gen: NAD, alert, cooperative with exam HEENT: NCAT CV: RRR, good S1/S2, no murmur Resp: CTABL, no wheezes, non-labored Abd: SNTND, BS present, no guarding or organomegaly Ext: No edema, warm Neuro: Alert and oriented, No gross deficits  Diabetic Foot Exam - Simple   Simple Foot Form Visual Inspection No deformities, no ulcerations, no other skin breakdown bilaterally:  Yes Sensation Testing Intact to touch and monofilament testing bilaterally:  Yes Pulse Check Posterior Tibialis and Dorsalis pulse intact bilaterally:  Yes Comments      Assessment and plan:  # Type 2 diabetes 8 pending, suspect that he will be slightly uncontrolled Consider adding GLP-1 agonist- Victoza Continue carbose and metformin Every 3 months plan to check creatinine  # Hypertension Well-controlled Patient reports stopping lisinopril, is well controlled without this he had improvement with orthostatic symptoms after stopping.   # Dysuria Mild intermittent, visible exam is reassuring Defer urine checked for now  # Healthcare maintenance Pneumonia shot  given today    Orders Placed This Encounter  Procedures  . Bayer DCA Hb A1c Waived  . CBC with Differential  . CMP14+EGFR  . Urinalysis, Complete    Sam , MD [facility] 10/02/2015, 8:44 AM     

## 2015-10-02 NOTE — Addendum Note (Signed)
Addended by: Karle Plumber on: 10/02/2015 08:51 AM   Modules accepted: Orders

## 2015-10-02 NOTE — Patient Instructions (Signed)
Great to see you!  Come back in 3 months, we will consider adding victoza if we need additional medication

## 2015-10-03 LAB — CBC WITH DIFFERENTIAL/PLATELET
BASOS ABS: 0 10*3/uL (ref 0.0–0.2)
Basos: 0 %
EOS (ABSOLUTE): 0.2 10*3/uL (ref 0.0–0.4)
EOS: 3 %
HEMATOCRIT: 42.8 % (ref 37.5–51.0)
HEMOGLOBIN: 13.8 g/dL (ref 12.6–17.7)
IMMATURE GRANS (ABS): 0.1 10*3/uL (ref 0.0–0.1)
IMMATURE GRANULOCYTES: 1 %
LYMPHS ABS: 1.8 10*3/uL (ref 0.7–3.1)
LYMPHS: 25 %
MCH: 31.6 pg (ref 26.6–33.0)
MCHC: 32.2 g/dL (ref 31.5–35.7)
MCV: 98 fL — ABNORMAL HIGH (ref 79–97)
MONOCYTES: 7 %
Monocytes Absolute: 0.5 10*3/uL (ref 0.1–0.9)
NEUTROS PCT: 64 %
Neutrophils Absolute: 4.4 10*3/uL (ref 1.4–7.0)
Platelets: 182 10*3/uL (ref 150–379)
RBC: 4.37 x10E6/uL (ref 4.14–5.80)
RDW: 15.3 % (ref 12.3–15.4)
WBC: 7 10*3/uL (ref 3.4–10.8)

## 2015-10-03 LAB — CMP14+EGFR
ALBUMIN: 4 g/dL (ref 3.5–4.8)
ALT: 16 IU/L (ref 0–44)
AST: 19 IU/L (ref 0–40)
Albumin/Globulin Ratio: 1.5 (ref 1.2–2.2)
Alkaline Phosphatase: 64 IU/L (ref 39–117)
BUN / CREAT RATIO: 15 (ref 10–24)
BUN: 18 mg/dL (ref 8–27)
Bilirubin Total: 0.4 mg/dL (ref 0.0–1.2)
CALCIUM: 9.5 mg/dL (ref 8.6–10.2)
CO2: 26 mmol/L (ref 18–29)
CREATININE: 1.21 mg/dL (ref 0.76–1.27)
Chloride: 95 mmol/L — ABNORMAL LOW (ref 96–106)
GFR calc Af Amer: 69 mL/min/{1.73_m2} (ref >59)
GFR, EST NON AFRICAN AMERICAN: 59 mL/min/{1.73_m2} — AB (ref >59)
GLOBULIN, TOTAL: 2.7 g/dL (ref 1.5–4.5)
GLUCOSE: 293 mg/dL — AB (ref 65–99)
Potassium: 4.9 mmol/L (ref 3.5–5.2)
SODIUM: 138 mmol/L (ref 134–144)
TOTAL PROTEIN: 6.7 g/dL (ref 6.0–8.5)

## 2015-10-05 ENCOUNTER — Other Ambulatory Visit: Payer: Self-pay | Admitting: Family Medicine

## 2015-10-05 MED ORDER — LIRAGLUTIDE 18 MG/3ML ~~LOC~~ SOPN: 1.2000 mg | PEN_INJECTOR | Freq: Every day | SUBCUTANEOUS | 0 refills | Status: DC

## 2015-10-06 ENCOUNTER — Telehealth: Payer: Self-pay | Admitting: Family Medicine

## 2015-10-07 ENCOUNTER — Inpatient Hospital Stay (HOSPITAL_COMMUNITY): Admission: RE | Admit: 2015-10-07 | Payer: Medicare Other | Source: Ambulatory Visit

## 2015-10-07 ENCOUNTER — Encounter (HOSPITAL_COMMUNITY): Payer: Self-pay

## 2015-10-07 ENCOUNTER — Encounter (HOSPITAL_COMMUNITY)
Admission: RE | Admit: 2015-10-07 | Discharge: 2015-10-07 | Disposition: A | Discharge status: Home / Self Care | Payer: Medicare Other | Source: Ambulatory Visit | Attending: Cardiology | Admitting: Cardiology

## 2015-10-07 ENCOUNTER — Telehealth: Payer: Self-pay | Admitting: Family Medicine

## 2015-10-07 DIAGNOSIS — R0602 Shortness of breath: Secondary | ICD-10-CM | POA: Diagnosis not present

## 2015-10-07 LAB — NM MYOCAR MULTI W/SPECT W/WALL MOTION / EF
CHL CUP NUCLEAR SDS: 3
CSEPPHR: 90 {beats}/min
LV dias vol: 82 mL (ref 62–150)
LVSYSVOL: 34 mL
RATE: 0.33
Rest HR: 81 {beats}/min
SRS: 2
SSS: 5
TID: 1.02

## 2015-10-07 MED ORDER — REGADENOSON 0.4 MG/5ML IV SOLN
INTRAVENOUS | Status: AC
  Administered 2015-10-07: 0.4 mg via INTRAVENOUS
  Filled 2015-10-07: qty 5

## 2015-10-07 MED ORDER — TECHNETIUM TC 99M TETROFOSMIN IV KIT
30.0000 | PACK | Freq: Once | INTRAVENOUS | Status: AC | PRN
  Administered 2015-10-07: 30 via INTRAVENOUS

## 2015-10-07 MED ORDER — SODIUM CHLORIDE 0.9% FLUSH
INTRAVENOUS | Status: AC
  Filled 2015-10-07: qty 150

## 2015-10-07 MED ORDER — TECHNETIUM TC 99M TETROFOSMIN IV KIT
10.0000 | PACK | Freq: Once | INTRAVENOUS | Status: AC | PRN
  Administered 2015-10-07: 10 via INTRAVENOUS

## 2015-10-07 MED ORDER — SODIUM CHLORIDE 0.9% FLUSH
INTRAVENOUS | Status: AC
  Administered 2015-10-07: 10 mL via INTRAVENOUS
  Filled 2015-10-07: qty 10

## 2015-10-07 NOTE — Telephone Encounter (Signed)
Pt called to inform Victoza is > $300 Please change to something cheaper

## 2015-10-07 NOTE — Telephone Encounter (Signed)
Instead of adding victoza or other med I think he will ultimatley do best if he has meal-time insulin.   Will ask that he keep a careful log with fasting and at least 1 2 hour post prandial reading per day and see our clinical pharmacist in 3-4 weeks.   He is already on 60 units of levemir, consider changing to tresiba.   Consider continuous glucose monitor for 24 hours.   Laroy Apple, MD [facility] 10/07/2015, 4:01 PM

## 2015-10-08 ENCOUNTER — Telehealth: Payer: Self-pay | Admitting: Family Medicine

## 2015-10-08 NOTE — Telephone Encounter (Signed)
Scheduled appt with Tammy per Wendi Snipes

## 2015-10-12 ENCOUNTER — Telehealth: Payer: Self-pay | Admitting: Family Medicine

## 2015-10-12 NOTE — Telephone Encounter (Signed)
Fasting blood sugar of 150-200 is a good goal, continue at 40 units.   Pay close attention to be sure to not get hypoglycemia, if this develops reduce dos to 35 units.     Laroy Apple, MD [facility] 10/12/2015, 11:25 AM

## 2015-10-12 NOTE — Telephone Encounter (Signed)
Patient called stating that he increased his insulin from 30 units to 40 units and his blood sugar came down to 163. Patient would like to know if he needs to increase to 50 units or stay.

## 2015-10-12 NOTE — Telephone Encounter (Signed)
Patient aware.

## 2015-10-14 ENCOUNTER — Ambulatory Visit: Payer: Medicare Other | Admitting: Pharmacist

## 2015-10-14 ENCOUNTER — Ambulatory Visit (INDEPENDENT_AMBULATORY_CARE_PROVIDER_SITE_OTHER): Payer: Medicare Other | Admitting: Cardiology

## 2015-10-14 ENCOUNTER — Encounter: Payer: Self-pay | Admitting: Cardiology

## 2015-10-14 ENCOUNTER — Ambulatory Visit: Payer: Medicare Other | Admitting: Cardiology

## 2015-10-14 VITALS — BP 128/68 | HR 80 | Ht 73.0 in | Wt 271.0 lb

## 2015-10-14 DIAGNOSIS — I483 Typical atrial flutter: Secondary | ICD-10-CM | POA: Diagnosis not present

## 2015-10-14 DIAGNOSIS — I10 Essential (primary) hypertension: Secondary | ICD-10-CM | POA: Diagnosis not present

## 2015-10-14 NOTE — Progress Notes (Signed)
HPI The patient presents for followup of aortic valve replacement.  He was in the hospital at [facility] earlier this year.  He had  bradycardia and was taken off of beta blockers.   There was a question of atrial fib which was not confirmed by the EKGs.  I placed a 48 Holter and he was not found to have atrial fib on this. At the last visit he was preop for a circumcision.  He did not have this done.  He returns for follow up is monitor did demonstrate some episodes of wide complex regular rhythm that could've been aberrancy or possibly an idioventricular rhythm. He was not having significant symptoms with this. He describes occasional palpitations. He's not had any presyncope or syncope. He says that with activity he is actually breathing better he's trying to walk most days. He's not having any jaw pressure, neck or arm discomfort. He's had no weight gain or edema.   Allergies  Allergen Reactions  . Phenothiazines Anaphylaxis  . Actos [Pioglitazone] Swelling    Current Outpatient Prescriptions  Medication Sig Dispense Refill  . acarbose (PRECOSE) 100 MG tablet TAKE ONE TABLET BY MOUTH TWICE DAILY 180 tablet 0  . acetaminophen (TYLENOL) 500 MG tablet Take 1,000 mg by mouth every 6 (six) hours as needed (pain).    Marland Kitchen aspirin 325 MG tablet Take 325 mg by mouth daily.    . citalopram (CELEXA) 20 MG tablet Take 1 tablet (20 mg total) by mouth daily. 30 tablet 0  . furosemide (LASIX) 20 MG tablet Take 20 mg by mouth daily.    . insulin detemir (LEVEMIR) 100 UNIT/ML injection Inject 0.3 mLs (30 Units total) into the skin 2 (two) times daily. 60 mL 11  . levothyroxine (SYNTHROID, LEVOTHROID) 200 MCG tablet Take 1 tablet (200 mcg total) by mouth daily before breakfast. 90 tablet 3  . metFORMIN (GLUCOPHAGE) 1000 MG tablet TAKE 1 TABLET BY MOUTH TWICE DAILY WITH A MEAL. 180 tablet 0  . Multiple Vitamins-Minerals (MENS MULTI VITAMIN & MINERAL PO) Take 1 tablet by mouth daily.      . Omega-3 Fatty Acids  (FISH OIL) 1200 MG CAPS Take 1,200-2,400 mg by mouth 2 (two) times daily. Take 1 capsule (1200 mg) by mouth daily at noon and 2 capsules (2400 mg) daily at bedtime    . simvastatin (ZOCOR) 80 MG tablet Take 40 mg by mouth at bedtime.     . tamsulosin (FLOMAX) 0.4 MG CAPS capsule Take 0.4 mg by mouth at bedtime.    Marland Kitchen glucose blood test strip Test blood sugar bid. DX E11.9 100 each 5   Current Facility-Administered Medications  Medication Dose Route Frequency Provider Last Rate Last Dose  . cyanocobalamin ((VITAMIN B-12)) injection 1,000 mcg  1,000 mcg Intramuscular Q30 days Claretta Fraise, MD   1,000 mcg at 12/23/14 1110    Past Medical History:  Diagnosis Date  . Arthritis   . BPH (benign prostatic hypertrophy) 04/15/2010  . Colon polyps   . DEPRESSION 01/22/2009  . Heart valve replaced   . HYPERCHOLESTEROLEMIA 01/22/2009  . HYPERTENSION 01/22/2009   Dr. [name]  . 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 Morbid obesity ([place])   . Tremors of nervous system    ?ptsd  . Ulcer     Past Surgical History:  Procedure Laterality Date  . 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 APPENDECTOMY    . bilateral cataract surg    .  COLONOSCOPY  04/24/2007   [name]: normal  . COLONOSCOPY WITH ESOPHAGOGASTRODUODENOSCOPY (EGD) N/A 04/05/2012   Procedure: COLONOSCOPY WITH ESOPHAGOGASTRODUODENOSCOPY (EGD);  Surgeon: Daneil Dolin, MD;  [facility];  Service: Endoscopy;  Laterality: N/A;  10;15  . DENTAL SURGERY  05/2004   Dental extractions  . HEEL SPUR SURGERY Bilateral    resection of heel spur  . KNEE ARTHROSCOPY WITH LATERAL MENISECTOMY Right 04/04/2014   Procedure: KNEE ARTHROSCOPY WITH LATERAL MENISECTOMY;  Surgeon: Carole Civil, MD;  Location: [facility];  Service: Orthopedics;  Laterality: Right;  . THYROIDECTOMY  03/21/2013   DR [name]  . THYROIDECTOMY N/A 03/21/2013   Procedure: THYROIDECTOMY;  Surgeon: [name], MD;  Location: [place];  Service: General;  Laterality: N/A;  . TONSILLECTOMY     ROS:   Positive for mild orthostatic symptoms. Otherwise as stated in the HPI and negative for all other systems.  PHYSICAL EXAM BP 128/68   Pulse 80   Ht 6\' 1"  (1.854 m)   Wt 271 lb (122.9 kg)   BMI 35.75 kg/m  GENERAL:  No acute distress NECK:  No jugular venous distention, waveform within normal limits, carotid upstroke brisk and symmetric, no bruits, no thyromegaly LUNGS:  Clear to auscultation bilaterally CHEST:  Well healed sternotomy scar. HEART:  PMI not displaced or sustained,S1 and S2 within normal limits, no S3, no S4 no clicks, no rubs, systolic murmur mid peaking and  radiating into the carotids  ABD:  Flat, positive bowel sounds normal in frequency in pitch, no bruits, no rebound, no guarding, no midline pulsatile mass, no hepatomegaly, no splenomegaly, obese EXT:  2 plus pulses throughout, moderate edema, no cyanosis no clubbing NEURO:  Nonfocal    Lab Results  Component Value Date   HGBA1C 7.3 03/25/2015   Lab Results  Component Value Date   CHOL 129 12/01/2014   TRIG 152 (H) 12/01/2014   HDL 38 (L) 12/01/2014   LDLCALC 61 12/01/2014    ASSESSMENT AND PLAN  DYSPNEA:   Given this complaint and his rhythm as described I would like to screen him with a stress test.  He would not be able to walk on a treadmill and will have a Lexiscan Myoview.  CHRONIC SYSTOLIC AND DIASTOLIC HF:   He seems to be euvolemic.  I will evaluate as above.   AORTIC VALVE REPLACEMENT, HX OF - He had stable valve prosthesis in the hospital on echocardiography in May at [place]. Marland Kitchen  He did appear to have a moderate gradient but no indication for TEE  HYPERTENSION -  His blood pressure is at target . He will continue the meds as listed.  CAROTID OCCLUSIVE DISEASE -  Carotid Doppler in July demonstrated only mild coronary plaque which will be followed again in two years.  BRADYCARDIA - He will  remain off of the beta blockers.    ATRIAL FIB - I carefully the records from [facility] and could not identify atrial fibrillation and there was not on her Holter. Therefore, I will not commit him to anticoagulation.   ARRHYTHMIA- He did have some wide complex probably junctional or possibly idioventricular rhythm. He has normal LV function. I will follow-up with stress perfusion testing.

## 2015-10-14 NOTE — Progress Notes (Signed)
HPI The patient presents for followup of aortic valve replacement.  He was in the hospital at [facility] earlier this year.  He had  bradycardia and was taken off of beta blockers.   There was a question of atrial fib which was not confirmed by the EKGs.  I placed a 48 Holter and he was not found to have atrial fib on this. At a previous visit he was preop for a circumcision.  He did not have this done.  After the last visit I sent him for [facility].  This demonstrated an EF of 59% with no significant ischemia and was low risk.   Since I last saw him he has done well.  The patient denies any new symptoms such as chest discomfort, neck or arm discomfort. There has been no new shortness of breath, PND or orthopnea. There have been no reported palpitations, presyncope or syncope.  He is not having any of the orthstatic symptoms that he was having.  He has much less dyspnea and is walking a mile daly.  Allergies  Allergen Reactions  . Phenothiazines Anaphylaxis  . Actos [Pioglitazone] Swelling    Current Outpatient Prescriptions  Medication Sig Dispense Refill  . acarbose (PRECOSE) 100 MG tablet TAKE ONE TABLET BY MOUTH TWICE DAILY 180 tablet 0  . acetaminophen (TYLENOL) 500 MG tablet Take 1,000 mg by mouth every 6 (six) hours as needed (pain).    Marland Kitchen aspirin 325 MG tablet Take 325 mg by mouth daily.    . citalopram (CELEXA) 20 MG tablet Take 1 tablet (20 mg total) by mouth daily. 30 tablet 0  . furosemide (LASIX) 20 MG tablet Take 20 mg by mouth daily.    . insulin detemir (LEVEMIR) 100 UNIT/ML injection Inject 0.3 mLs (30 Units total) into the skin 2 (two) times daily. 60 mL 11  . levothyroxine (SYNTHROID, LEVOTHROID) 200 MCG tablet Take 1 tablet (200 mcg total) by mouth daily before breakfast. 90 tablet 3  . metFORMIN (GLUCOPHAGE) 1000 MG tablet TAKE 1 TABLET BY MOUTH TWICE DAILY WITH A MEAL. 180 tablet 0  . Multiple Vitamins-Minerals (MENS MULTI VITAMIN & MINERAL PO) Take 1 tablet by mouth  daily.      . Omega-3 Fatty Acids (FISH OIL) 1200 MG CAPS Take 1,200-2,400 mg by mouth 2 (two) times daily. Take 1 capsule (1200 mg) by mouth daily at noon and 2 capsules (2400 mg) daily at bedtime    . simvastatin (ZOCOR) 80 MG tablet Take 40 mg by mouth at bedtime.     . tamsulosin (FLOMAX) 0.4 MG CAPS capsule Take 0.4 mg by mouth at bedtime.    Marland Kitchen glucose blood test strip Test blood sugar bid. DX E11.9 100 each 5   Current Facility-Administered Medications  Medication Dose Route Frequency Provider Last Rate Last Dose  . cyanocobalamin ((VITAMIN B-12)) injection 1,000 mcg  1,000 mcg Intramuscular Q30 days Claretta Fraise, MD   1,000 mcg at 12/23/14 1110    Past Medical History:  Diagnosis Date  . Arthritis   . BPH (benign prostatic hypertrophy) 04/15/2010  . Colon polyps   . DEPRESSION 01/22/2009  . Heart valve replaced   . HYPERCHOLESTEROLEMIA 01/22/2009  . HYPERTENSION 01/22/2009   Dr. [name]  . 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 Morbid obesity ([place])   . Tremors of nervous system    ?ptsd  . Ulcer     Past Surgical History:  Procedure Laterality Date  . AORTIC VALVE REPLACEMENT  July  2006   #20 stentless [place] porcine valve  . APPENDECTOMY    . bilateral cataract surg    . COLONOSCOPY  04/24/2007   [name]: normal  . COLONOSCOPY WITH ESOPHAGOGASTRODUODENOSCOPY (EGD) N/A 04/05/2012   Procedure: COLONOSCOPY WITH ESOPHAGOGASTRODUODENOSCOPY (EGD);  Surgeon: Daneil Dolin, MD;  [facility];  Service: Endoscopy;  Laterality: N/A;  10;15  . DENTAL SURGERY  05/2004   Dental extractions  . HEEL SPUR SURGERY Bilateral    resection of heel spur  . KNEE ARTHROSCOPY WITH LATERAL MENISECTOMY Right 04/04/2014   Procedure: KNEE ARTHROSCOPY WITH LATERAL MENISECTOMY;  Surgeon: Carole Civil, MD;  Location: [facility];  Service: Orthopedics;  Laterality: Right;  . THYROIDECTOMY  03/21/2013   DR [name]  . THYROIDECTOMY N/A 03/21/2013   Procedure:  THYROIDECTOMY;  Surgeon: [name], MD;  Location: [place];  Service: General;  Laterality: N/A;  . TONSILLECTOMY     ROS:   As stated in the HPI and negative for all other systems.  PHYSICAL EXAM BP 128/68   Pulse 80   Ht 6\' 1"  (1.854 m)   Wt 271 lb (122.9 kg)   BMI 35.75 kg/m  GENERAL:  No acute distress NECK:  No jugular venous distention, waveform within normal limits, carotid upstroke brisk and symmetric, no bruits, no thyromegaly LUNGS:  Clear to auscultation bilaterally CHEST:  Well healed sternotomy scar. HEART:  PMI not displaced or sustained,S1 and S2 within normal limits, no S3, no S4 no clicks, no rubs, systolic murmur mid peaking and  radiating into the carotids  ABD:  Flat, positive bowel sounds normal in frequency in pitch, no bruits, no rebound, no guarding, no midline pulsatile mass, no hepatomegaly, no splenomegaly, obese EXT:  2 plus pulses throughout, moderate edema, no cyanosis no clubbing NEURO:  Nonfocal    Lab Results  Component Value Date   HGBA1C 7.3 03/25/2015   Lab Results  Component Value Date   CHOL 129 12/01/2014   TRIG 152 (H) 12/01/2014   HDL 38 (L) 12/01/2014   LDLCALC 61 12/01/2014   EKG:  Sinus rhythm, rate 80, left axis deviation, incomplete right bundle branch block, premature atrial contractions, no acute ST-T wave changes. 10/14/2015  ASSESSMENT AND PLAN  DYSPNEA:   This is improved. No further workup is planned.  CHRONIC SYSTOLIC AND DIASTOLIC HF:   He seems to be euvolemic.  I will evaluate as above.   AORTIC VALVE REPLACEMENT, HX OF - He had stable valve prosthesis in the hospital on echocardiography in May at [place].  There is some moderate stenosis and I will follow this clinically and with repeat echoes.  HYPERTENSION -  His blood pressure is at target . He will continue the meds as listed.  CAROTID OCCLUSIVE DISEASE -  Carotid Doppler in July demonstrated only mild coronary plaque which will be followed again in two  years.  BRADYCARDIA - He will remain off of the beta blockers.    ATRIAL FIB - I carefully the records from [facility] and could not identify atrial fibrillation and there was not on her Holter. Therefore, I will not commit him to anticoagulation.   ARRHYTHMIA- He's had no further symptomatic evidence of dysrhythmias as negative vision imaging and a preserved ejection fraction. Previous electrolytes are normal. No further workup is planned.

## 2015-10-14 NOTE — Patient Instructions (Signed)

## 2015-10-22 DIAGNOSIS — L609 Nail disorder, unspecified: Secondary | ICD-10-CM | POA: Diagnosis not present

## 2015-10-22 DIAGNOSIS — E1151 Type 2 diabetes mellitus with diabetic peripheral angiopathy without gangrene: Secondary | ICD-10-CM | POA: Diagnosis not present

## 2015-10-22 DIAGNOSIS — L110 Acquired keratosis follicularis: Secondary | ICD-10-CM | POA: Diagnosis not present

## 2015-10-22 DIAGNOSIS — E1140 Type 2 diabetes mellitus with diabetic neuropathy, unspecified: Secondary | ICD-10-CM | POA: Diagnosis not present

## 2015-11-10 ENCOUNTER — Telehealth: Payer: Self-pay

## 2015-11-10 ENCOUNTER — Ambulatory Visit (INDEPENDENT_AMBULATORY_CARE_PROVIDER_SITE_OTHER): Payer: Medicare Other

## 2015-11-10 DIAGNOSIS — Z794 Long term (current) use of insulin: Secondary | ICD-10-CM | POA: Diagnosis not present

## 2015-11-10 DIAGNOSIS — N4883 Acquired buried penis: Secondary | ICD-10-CM | POA: Diagnosis not present

## 2015-11-10 DIAGNOSIS — E119 Type 2 diabetes mellitus without complications: Secondary | ICD-10-CM | POA: Diagnosis not present

## 2015-11-10 DIAGNOSIS — N3941 Urge incontinence: Secondary | ICD-10-CM | POA: Insufficient documentation

## 2015-11-10 DIAGNOSIS — N529 Male erectile dysfunction, unspecified: Secondary | ICD-10-CM | POA: Insufficient documentation

## 2015-11-10 DIAGNOSIS — N528 Other male erectile dysfunction: Secondary | ICD-10-CM | POA: Diagnosis not present

## 2015-11-10 DIAGNOSIS — Z23 Encounter for immunization: Secondary | ICD-10-CM | POA: Diagnosis not present

## 2015-11-10 NOTE — Telephone Encounter (Signed)
Patient returned call about lab work.  I explained his results to him and he said he could not afford the Victoza, it was $300.00 per month.  I tried to schedule an appointment with Tammy to discuss his options and he states he will need to call back to make this appointment.

## 2015-11-11 ENCOUNTER — Telehealth: Payer: Self-pay | Admitting: Family Medicine

## 2015-11-16 ENCOUNTER — Telehealth: Payer: Self-pay | Admitting: Family Medicine

## 2015-11-17 MED ORDER — LIRAGLUTIDE 18 MG/3ML ~~LOC~~ SOPN: 1.2000 mg | PEN_INJECTOR | Freq: Every day | SUBCUTANEOUS | 3 refills | Status: DC

## 2015-11-17 MED ORDER — INSULIN PEN NEEDLE 31G X 8 MM MISC: 1.0000 | Freq: Every day | 3 refills | Status: DC

## 2015-11-17 NOTE — Telephone Encounter (Signed)
Filled victoza, will ask that he come in to see clinical pharmacist for first injection and discussion about reducing insulin doses.   Laroy Apple, MD [facility] 11/17/2015, 5:38 PM

## 2015-11-17 NOTE — Telephone Encounter (Signed)
duplicate

## 2015-11-17 NOTE — Telephone Encounter (Signed)
He says he was put on Victoza and it was too expensive, now he says he can get it from the [place], if we print out a script for that and the pens?

## 2015-11-18 NOTE — Telephone Encounter (Signed)
Patient aware and states you have the [place] fax number it is supposed to be faxed to. Do you have the number? Send back to pools

## 2015-11-18 NOTE — Telephone Encounter (Signed)
Yes I have number and will fax

## 2015-11-19 ENCOUNTER — Encounter: Payer: Self-pay | Admitting: Pharmacist

## 2015-11-19 ENCOUNTER — Ambulatory Visit (INDEPENDENT_AMBULATORY_CARE_PROVIDER_SITE_OTHER): Payer: Medicare Other | Admitting: Pharmacist

## 2015-11-19 ENCOUNTER — Telehealth: Payer: Self-pay | Admitting: Family Medicine

## 2015-11-19 VITALS — BP 132/70 | HR 68 | Ht 73.0 in | Wt 270.0 lb

## 2015-11-19 DIAGNOSIS — E1165 Type 2 diabetes mellitus with hyperglycemia: Secondary | ICD-10-CM

## 2015-11-19 DIAGNOSIS — Z794 Long term (current) use of insulin: Secondary | ICD-10-CM

## 2015-11-19 DIAGNOSIS — IMO0001 Reserved for inherently not codable concepts without codable children: Secondary | ICD-10-CM

## 2015-11-19 NOTE — Patient Instructions (Signed)
I gave you an injection of Trulicity 0.75mg  in the office today.  You have another injection to give next Thursday, November 2nd.  Once Victoza arrives  When you received Victoza you will start with 0.6mg  once a day on Thursday, November 9th.  Continue Victoza 0.6mg  daily for the first week Increase to 1.2mg  the second week  Increase to 1.8mg  the third week and continue at that dose.

## 2015-11-19 NOTE — Telephone Encounter (Signed)
Wanted to know if Trulicity would caused him to have to urinate more.  Let him know that it is not a usual side effect of Trulicity.

## 2015-11-19 NOTE — Progress Notes (Signed)
Patient ID: [name], male   DOB: 1945-03-02, 70 y.o.   MRN: [id number]   Subjective:    [name] is a 70 y.o. male who presents for an intital evaluation of Type 2 diabetes mellitus.  Current symptoms/problems include hyperglycemia.  Mr. Bors is planning to have buried penis repair surgery but this has been postponed until better control of type 2 DM.  He is currently taking Levemir 40 units BID and Metformin 1000mg  bid.  He has been prescribed Victoza however it was not covered by his Medicare plan but Rx was sent yesterday to [place] and he will hopefully received in the next week or two.  He is listed as   taking acarbose but he does not believe he is suppose to be taking this.    Known diabetic complications: peripheral neuropathy and cardiovascular disease Cardiovascular risk factors: advanced age (older than 87 for men, 41 for women), diabetes mellitus, dyslipidemia, hypertension, male gender and obesity (BMI >= 30 kg/m2)  Eye exam current (within one year): yes Weight trend: stable Prior visit with CDE: no Current diet: trying to follow low fat and CHO counting diet.  Most day he does well but has 2-3 times during the week that are high CHO or fat Current exercise: none  Current monitoring regimen: home blood tests - 2 times daily Home blood sugar records: ranges from 160's to 250's Any episodes of hypoglycemia? no  Is He on ACE inhibitor or angiotensin II receptor blocker?  No  Patient previously too lisinopril 10mg  qd but he stopped on his own around 12/01/2014     Objective:    BP 132/70   Pulse 68   Ht 6\' 1"  (1.854 m)   Wt 270 lb (122.5 kg)   BMI 35.62 kg/m   A1c = 9.8% (11/10/2015 at [facility]) A1c = 10.6% (10/02/2015)  Lab Review Glucose (mg/dL)  Date Value  10/02/2015 293 (H)  06/17/2015 115 (H)   Glucose, Bld (mg/dL)  Date Value  05/25/2015 111 (H)  05/24/2015 229 (H)  05/23/2015 180 (H)   CO2 (mmol/L)  Date Value  10/02/2015 26  06/17/2015  23  05/25/2015 27   BUN (mg/dL)  Date Value  10/02/2015 18  06/17/2015 25  05/25/2015 26 (H)  05/24/2015 28 (H)  05/23/2015 24 (H)  03/25/2015 26   Creat (mg/dL)  Date Value  08/02/2012 1.16  04/16/2012 1.18   Creatinine, Ser (mg/dL)  Date Value  10/02/2015 1.21  06/17/2015 1.34 (H)  05/25/2015 1.49 (H)     Assessment:    Diabetes Mellitus type II, under variable and  inadequate control.   Obesity - weight loss to help with both DM and decrease complications of surgery   Plan:    1.  Rx changes:     Patient was given injection of Trulicity 0.75mg  in office today and also #1 sample for next week(11/26/15).  [facility] is [facility] or [facility].  He will start Victoza 12/03/15.  I instructed patient on how to inject both Trulicity and Victoza.    Reviewed directions for Victoza - start with 0.6mg  daily for first week, then 1.2mg  daily for second week, then increase to 1.8mg  daily thereafter.  Reviewed possible side effects including nausea.    Remain off Acarbose 2.  Continuous Glucose monitor was placed today to try to get better understanding of BG trends.  Patient educated about proper care of CGM and site.  CGM was placed on back of upper left arm.  Patient will wear at least 5 days and up to 14 days.  He is to keep BG, diet and insulin administration records. He can engage in regular activities with special care when showering, changing clothes and swimming (only up to 3 feet and for 30 minutes at a time) 3. Reviewed s/s of hypoglucemia - patient is to call office if experienced hypoglycemia as we might need to adjust insulin dose with the addition of GLP1 agent 4. CHO counting diet discussed.  Reviewed CHO amount in various foods and how to read nutrition labels.  Discussed recommended serving sizes.  5.  Recommend check BG 2  times a day 6.  Recommended increase physical activity - goal is 150 minutes per week 7. Follow up: 2 weeks to review CMG results and make medication  adjustments as needed.

## 2015-11-20 ENCOUNTER — Other Ambulatory Visit: Payer: Self-pay | Admitting: *Deleted

## 2015-11-20 MED ORDER — LIRAGLUTIDE 18 MG/3ML ~~LOC~~ SOPN: 1.2000 mg | PEN_INJECTOR | Freq: Every day | SUBCUTANEOUS | 3 refills | Status: DC

## 2015-11-20 MED ORDER — INSULIN PEN NEEDLE 31G X 8 MM MISC: 5 refills | Status: DC

## 2015-11-20 MED ORDER — LIRAGLUTIDE 18 MG/3ML ~~LOC~~ SOPN: PEN_INJECTOR | SUBCUTANEOUS | 3 refills | Status: DC

## 2015-11-20 NOTE — Addendum Note (Signed)
Addended by: Thana Ates on: 11/20/2015 11:45 AM   Modules accepted: Orders

## 2015-11-20 NOTE — Addendum Note (Signed)
Addended by: Thana Ates on: 11/20/2015 11:39 AM   Modules accepted: Orders

## 2015-11-30 DIAGNOSIS — H3563 Retinal hemorrhage, bilateral: Secondary | ICD-10-CM | POA: Diagnosis not present

## 2015-11-30 DIAGNOSIS — E113493 Type 2 diabetes mellitus with severe nonproliferative diabetic retinopathy without macular edema, bilateral: Secondary | ICD-10-CM | POA: Diagnosis not present

## 2015-11-30 DIAGNOSIS — H35043 Retinal micro-aneurysms, unspecified, bilateral: Secondary | ICD-10-CM | POA: Diagnosis not present

## 2015-11-30 DIAGNOSIS — H43811 Vitreous degeneration, right eye: Secondary | ICD-10-CM | POA: Diagnosis not present

## 2015-11-30 LAB — HM DIABETES EYE EXAM

## 2015-12-03 ENCOUNTER — Ambulatory Visit (INDEPENDENT_AMBULATORY_CARE_PROVIDER_SITE_OTHER): Payer: Medicare Other | Admitting: Pharmacist

## 2015-12-03 ENCOUNTER — Telehealth: Payer: Self-pay | Admitting: Family Medicine

## 2015-12-03 ENCOUNTER — Encounter: Payer: Self-pay | Admitting: Pharmacist

## 2015-12-03 VITALS — BP 134/70 | HR 78 | Ht 73.0 in | Wt 272.0 lb

## 2015-12-03 DIAGNOSIS — E1165 Type 2 diabetes mellitus with hyperglycemia: Secondary | ICD-10-CM

## 2015-12-03 DIAGNOSIS — Z6835 Body mass index (BMI) 35.0-35.9, adult: Secondary | ICD-10-CM

## 2015-12-03 DIAGNOSIS — Z794 Long term (current) use of insulin: Secondary | ICD-10-CM | POA: Diagnosis not present

## 2015-12-03 DIAGNOSIS — IMO0001 Reserved for inherently not codable concepts without codable children: Secondary | ICD-10-CM

## 2015-12-03 DIAGNOSIS — E6609 Other obesity due to excess calories: Secondary | ICD-10-CM | POA: Diagnosis not present

## 2015-12-03 MED ORDER — DULAGLUTIDE 0.75 MG/0.5ML ~~LOC~~ SOAJ
0.7500 mg | SUBCUTANEOUS | 0 refills | Status: DC
Start: 2015-12-03 — End: 2015-12-10

## 2015-12-03 MED ORDER — INSULIN DETEMIR 100 UNIT/ML ~~LOC~~ SOLN: 45.0000 [IU] | Freq: Two times a day (BID) | SUBCUTANEOUS | Status: DC

## 2015-12-03 NOTE — Telephone Encounter (Signed)
Patient called to tell me he received Victoza in mail today.  He has given Trulicity in office today.  He will bring Victoza into office nexxt thursday and I will review how to administer.

## 2015-12-03 NOTE — Progress Notes (Signed)
Patient ID: [name], male   DOB: 06/09/1945, 70 y.o.   MRN: [id number]   Subjective:    [name] is a 70 y.o. male who presents for a follow up evaluation of Type 2 diabetes mellitus.    When he was in 2 weeks and a continuous glucose monitor was placed - Freestyle LIbra Pro.  CGM was removed today and report downloaded and reviewed. Report is detailed below and will be scanned into his chart.   [name] is planning to have buried penis repair surgery but this has been postponed until better control of type 2 DM.  He is currently taking Levemir 60munits BID and Metformin 1000mg  bid.  He has been prescribed Victoza however it was not covered by his Medicare plan but Rx was sent to [place].  He has not received Victoza yet.  At our last visit he received 1 dose of Trulicity in office and was given another dose to take home for last week.  He has tolerated Trulicity well.     Known diabetic complications: peripheral neuropathy and cardiovascular disease Cardiovascular risk factors: advanced age (older than 96 for men, 34 for women), diabetes mellitus, dyslipidemia, hypertension, male gender and obesity (BMI >= 30 kg/m2)  Eye exam current (within one year): yes Weight trend: stable Prior visit with CDE: yes Current diet: trying to follow low fat and CHO counting diet.  Most day he does well but has 2-3 times during the week that are high CHO or fat Current exercise: none  Average daily BG was 300 over the last 14 days.  However the tend is that avg BG is decreasing but avg BG of 200's the last 5 days.  BG tread from CGM report show BG decreases from high around 350 in the evenings to lowest of 210's between 2am and 6am.       Objective:    BP 134/70   Pulse 78   Ht 6\' 1"  (1.854 m)   Wt 272 lb (123.4 kg)   BMI 35.89 kg/m   A1c = 9.8% (11/10/2015 at [facility]) A1c = 10.6% (10/02/2015)  Lab Review Glucose (mg/dL)  Date Value  10/02/2015 293 (H)  06/17/2015 115 (H)    Glucose, Bld (mg/dL)  Date Value  05/25/2015 111 (H)  05/24/2015 229 (H)  05/23/2015 180 (H)   CO2 (mmol/L)  Date Value  10/02/2015 26  06/17/2015 23  05/25/2015 27   BUN (mg/dL)  Date Value  10/02/2015 18  06/17/2015 25  05/25/2015 26 (H)  05/24/2015 28 (H)  05/23/2015 24 (H)  03/25/2015 26   Creat (mg/dL)  Date Value  08/02/2012 1.16  04/16/2012 1.18   Creatinine, Ser (mg/dL)  Date Value  10/02/2015 1.21  06/17/2015 1.34 (H)  05/25/2015 1.49 (H)     Assessment:    Diabetes Mellitus type II, under variable and  inadequate control.   CGM shows rise of BG during the day likely related to inadequate insulin production in relation to meals.  Obesity - weight loss to help with both DM and decrease complications of surgery   Plan:    1.  Rx changes:     Patient was given injection of Trulicity 0.75mg  in office today and also #1 sample for next week(11/26/15).  [facility] is [facility] or [facility].   (if post prandial BG remains elevated then will add meal time insulin)  Increase Levemir to 45 units BID.  (we also discussed switch to [place] however per pt  he was prescribed [place] Aas earlier this year but was not covered by insurance - will check again in 2018 as he is switching Medicare plans).     2.  CHO counting diet discussed.  Reviewed CHO amount in various foods and how to read nutrition labels.  Discussed recommended serving sizes.  3.   Recommend check BG 2  times a day 4.    Recommended increase physical activity - goal is 150 minutes per week

## 2015-12-10 ENCOUNTER — Ambulatory Visit (INDEPENDENT_AMBULATORY_CARE_PROVIDER_SITE_OTHER): Payer: Medicare Other | Admitting: Pharmacist

## 2015-12-10 DIAGNOSIS — Z6836 Body mass index (BMI) 36.0-36.9, adult: Secondary | ICD-10-CM | POA: Diagnosis not present

## 2015-12-10 DIAGNOSIS — Z794 Long term (current) use of insulin: Secondary | ICD-10-CM

## 2015-12-10 DIAGNOSIS — IMO0001 Reserved for inherently not codable concepts without codable children: Secondary | ICD-10-CM

## 2015-12-10 DIAGNOSIS — E1165 Type 2 diabetes mellitus with hyperglycemia: Secondary | ICD-10-CM

## 2015-12-10 DIAGNOSIS — E6609 Other obesity due to excess calories: Secondary | ICD-10-CM | POA: Diagnosis not present

## 2015-12-10 NOTE — Progress Notes (Signed)
Patient ID: [name], male   DOB: 03-Nov-1945, 70 y.o.   MRN: [id number]   Subjective:    [name] is a 70 y.o. male who presents for instruction on Victoza injection.  Mr. Belzer has been taking Trulicity 0.75mg  weekly until Victoza was authorized by his insurance.  He received his last Trulicity dose 1 week ago and has now received 3 months supply of Victoza in mail last week.   He is also currently taking Levemir 42 units BID and Metformin 1000mg  bid.     Known diabetic complications: peripheral neuropathy and cardiovascular disease Cardiovascular risk factors: advanced age (older than 60 for men, 37 for women), diabetes mellitus, dyslipidemia, hypertension, male gender and obesity (BMI >= 30 kg/m2)    Objective:    There were no vitals taken for this visit.  A1c = 9.8% (11/10/2015 at [facility]) A1c = 10.6% (10/02/2015)  Lab Review Glucose (mg/dL)  Date Value  10/02/2015 293 (H)  06/17/2015 115 (H)   Glucose, Bld (mg/dL)  Date Value  05/25/2015 111 (H)  05/24/2015 229 (H)  05/23/2015 180 (H)   CO2 (mmol/L)  Date Value  10/02/2015 26  06/17/2015 23  05/25/2015 27   BUN (mg/dL)  Date Value  10/02/2015 18  06/17/2015 25  05/25/2015 26 (H)  05/24/2015 28 (H)  05/23/2015 24 (H)  03/25/2015 26   Creat (mg/dL)  Date Value  08/02/2012 1.16  04/16/2012 1.18   Creatinine, Ser (mg/dL)  Date Value  10/02/2015 1.21  06/17/2015 1.34 (H)  05/25/2015 1.49 (H)     Assessment:    Diabetes Mellitus type II, under variable and  inadequate control.   Obesity - weight loss to help with both DM and decrease complications of surgery   Plan:    1.  Patient is instrcted on administration of Victoza.  Also reviewed dosing instructions - start with 0.6mg  for the first week and then increase to 1.2mg  thereafter.  Reviewed possible side effects to monitor for and to call office if he had an abdominal pain or severe nausea.  2.   Recommend check BG 2  times a day 3.   Continue as planned to follow up with PCP in 2-3 weeks.   Patient ID: [name], male   DOB: August 08, 1945, 70 y.o.   MRN: [id number]

## 2015-12-21 ENCOUNTER — Ambulatory Visit (INDEPENDENT_AMBULATORY_CARE_PROVIDER_SITE_OTHER): Payer: Medicare Other | Admitting: Nurse Practitioner

## 2015-12-21 ENCOUNTER — Encounter: Payer: Self-pay | Admitting: Nurse Practitioner

## 2015-12-21 VITALS — BP 144/82 | HR 76 | Temp 96.8°F | Ht 73.0 in | Wt 273.0 lb

## 2015-12-21 DIAGNOSIS — N3000 Acute cystitis without hematuria: Secondary | ICD-10-CM

## 2015-12-21 DIAGNOSIS — R300 Dysuria: Secondary | ICD-10-CM

## 2015-12-21 DIAGNOSIS — E119 Type 2 diabetes mellitus without complications: Secondary | ICD-10-CM

## 2015-12-21 LAB — URINALYSIS, COMPLETE
BILIRUBIN UA: NEGATIVE
Glucose, UA: NEGATIVE
KETONES UA: NEGATIVE
NITRITE UA: NEGATIVE
Protein, UA: NEGATIVE
SPEC GRAV UA: 1.01 (ref 1.005–1.030)
UUROB: 0.2 mg/dL (ref 0.2–1.0)
pH, UA: 6 (ref 5.0–7.5)

## 2015-12-21 LAB — MICROSCOPIC EXAMINATION: Renal Epithel, UA: NONE SEEN /hpf

## 2015-12-21 LAB — BAYER DCA HB A1C WAIVED: HB A1C (BAYER DCA - WAIVED): 9.1 % — ABNORMAL HIGH (ref <7.0)

## 2015-12-21 MED ORDER — CIPROFLOXACIN HCL 500 MG PO TABS: 500.0000 mg | ORAL_TABLET | Freq: Two times a day (BID) | ORAL | 0 refills | Status: DC

## 2015-12-21 NOTE — Progress Notes (Signed)
   Subjective:    Patient ID: [name], male    DOB: Jun 19, 1945, 70 y.o.   MRN: [id number]  HPI Patient comes in: - C/O dysuria and frequency- started 2 nights ago- feels like he has to go to the bathroom all the time. Denies any fever or low back pain. - Patient saw clinical pharmacist a month ago for his diabetes which was out of control. She started him on victozia injections. Blood sugars have improved. Fasting blood sugars are down into the 100's.    Review of Systems  Constitutional: Negative for fatigue and fever.  HENT: Negative.   Gastrointestinal: Negative.   Genitourinary: Positive for dysuria, frequency and urgency.  Musculoskeletal: Negative.   Neurological: Negative.   Psychiatric/Behavioral: Negative.   All other systems reviewed and are negative.      Objective:   Physical Exam  Constitutional: He is oriented to person, place, and time. He appears well-developed and well-nourished.  Cardiovascular: Normal rate, regular rhythm and normal heart sounds.   Pulmonary/Chest: Effort normal and breath sounds normal.  Neurological: He is alert and oriented to person, place, and time.  Skin: Skin is warm.  Psychiatric: He has a normal mood and affect. His behavior is normal. Judgment and thought content normal.    BP (!) 144/82 (BP Location: Left Arm, Cuff Size: Large)   Pulse 76   Temp (!) 96.8 F (36 C) (Oral)   Ht 6\' 1"  (1.854 m)   Wt 273 lb (123.8 kg)   BMI 36.02 kg/m   Hgba1c 9.1%       Assessment & Plan:  1. Dysuria - Urinalysis, Complete  2. Acute cystitis without hematuria Take medication as prescribe Cotton underwear Take shower not bath Cranberry juice, yogurt Force fluids AZO over the counter X2 days Culture pending RTO prn  - ciprofloxacin (CIPRO) 500 MG tablet; Take 1 tablet (500 mg total) by mouth 2 (two) times daily.  Dispense: 10 tablet; Refill: 0  3. DM type 2, not at goal [facility]) Continue to watch carbs in diet and follow up  in 1 month - [facility] [place] Hb A1c [place], FNP

## 2015-12-23 LAB — URINE CULTURE

## 2016-01-04 ENCOUNTER — Encounter: Payer: Self-pay | Admitting: Family Medicine

## 2016-01-04 ENCOUNTER — Ambulatory Visit (INDEPENDENT_AMBULATORY_CARE_PROVIDER_SITE_OTHER): Payer: Medicare Other | Admitting: Family Medicine

## 2016-01-04 VITALS — BP 139/70 | HR 88 | Temp 96.7°F | Ht 73.0 in | Wt 274.2 lb

## 2016-01-04 DIAGNOSIS — E119 Type 2 diabetes mellitus without complications: Secondary | ICD-10-CM | POA: Diagnosis not present

## 2016-01-04 DIAGNOSIS — E785 Hyperlipidemia, unspecified: Secondary | ICD-10-CM | POA: Diagnosis not present

## 2016-01-04 DIAGNOSIS — I10 Essential (primary) hypertension: Secondary | ICD-10-CM

## 2016-01-04 DIAGNOSIS — E039 Hypothyroidism, unspecified: Secondary | ICD-10-CM

## 2016-01-04 NOTE — Progress Notes (Signed)
   HPI  Patient presents today for follow-up for chronic medical conditions  Type 2 diabetes Sugars improved with the toes and Reports average fasting of 130-180 Denies hypoglycemia Average postprandial 150-200, some readings up to 250 with dietary indiscretion.  Hypertension Good medication compliance, no chest pain. Hypothyroidism Takes Synthroid 30 minutes apart from other medication and food.  Hyperlipidemia No side effects or medication, fasting today, requesting labs  PMH: Smoking status noted ROS: Per HPI  Objective: BP 139/70   Pulse 88   Temp (!) 96.7 F (35.9 C) (Oral)   Ht '6\' 1"'$  (1.854 m)   Wt 274 lb 3.2 oz (124.4 kg)   BMI 36.18 kg/m  Gen: NAD, alert, cooperative with exam HEENT: NCAT CV: RRR, good S1/S2, no murmur Resp: CTABL, no wheezes, non-labored Ext: No edema, warm Neuro: Alert and oriented, No gross deficits  Diabetic Foot Exam - Simple   Simple Foot Form Visual Inspection No deformities, no ulcerations, no other skin breakdown bilaterally:  Yes Sensation Testing Intact to touch and monofilament testing bilaterally:  Yes Pulse Check Posterior Tibialis and Dorsalis pulse intact bilaterally:  Yes Comments      Assessment and plan:  # Type 2 diabetes Control improving, patient has been on Victoza for about one month with an improvement of his A1c of about 1% so far, no changes yet Continue therapeutic lifestyle changes Continue Levemir, metformin, Victoza  # Hyperlipidemia Repeating labs, continue simvastatin  # Hypothyroidism Repeating labs, continue Synthroid at 200 g  # Hypertension Well-controlled, no changes   Orders Placed This Encounter  Procedures  . Lipid panel  . CBC with Differential/Platelet  . CMP14+EGFR  . TSH    No orders of the defined types were placed in this encounter.   Laroy Apple, MD [facility] 01/04/2016, 8:29 AM

## 2016-01-05 ENCOUNTER — Telehealth: Payer: Self-pay

## 2016-01-05 ENCOUNTER — Telehealth: Payer: Self-pay | Admitting: Family Medicine

## 2016-01-05 LAB — LIPID PANEL
CHOL/HDL RATIO: 2.8 ratio (ref 0.0–5.0)
CHOLESTEROL TOTAL: 139 mg/dL (ref 100–199)
HDL: 50 mg/dL (ref >39)
LDL CALC: 73 mg/dL (ref 0–99)
Triglycerides: 79 mg/dL (ref 0–149)
VLDL Cholesterol Cal: 16 mg/dL (ref 5–40)

## 2016-01-05 LAB — CMP14+EGFR
ALT: 23 IU/L (ref 0–44)
AST: 28 IU/L (ref 0–40)
Albumin/Globulin Ratio: 1.6 (ref 1.2–2.2)
Albumin: 4.1 g/dL (ref 3.5–4.8)
Alkaline Phosphatase: 55 IU/L (ref 39–117)
BUN/Creatinine Ratio: 16 (ref 10–24)
BUN: 20 mg/dL (ref 8–27)
Bilirubin Total: 0.4 mg/dL (ref 0.0–1.2)
CALCIUM: 9.5 mg/dL (ref 8.6–10.2)
CO2: 24 mmol/L (ref 18–29)
CREATININE: 1.23 mg/dL (ref 0.76–1.27)
Chloride: 98 mmol/L (ref 96–106)
GFR, EST AFRICAN AMERICAN: 67 mL/min/{1.73_m2} (ref >59)
GFR, EST NON AFRICAN AMERICAN: 58 mL/min/{1.73_m2} — AB (ref >59)
GLUCOSE: 196 mg/dL — AB (ref 65–99)
Globulin, Total: 2.6 g/dL (ref 1.5–4.5)
Potassium: 4.9 mmol/L (ref 3.5–5.2)
Sodium: 139 mmol/L (ref 134–144)
TOTAL PROTEIN: 6.7 g/dL (ref 6.0–8.5)

## 2016-01-05 LAB — CBC WITH DIFFERENTIAL/PLATELET
BASOS ABS: 0 10*3/uL (ref 0.0–0.2)
BASOS: 1 %
EOS (ABSOLUTE): 0.2 10*3/uL (ref 0.0–0.4)
Eos: 3 %
HEMOGLOBIN: 13.5 g/dL (ref 13.0–17.7)
Hematocrit: 40.3 % (ref 37.5–51.0)
IMMATURE GRANS (ABS): 0 10*3/uL (ref 0.0–0.1)
IMMATURE GRANULOCYTES: 0 %
LYMPHS: 22 %
Lymphocytes Absolute: 1.6 10*3/uL (ref 0.7–3.1)
MCH: 32.7 pg (ref 26.6–33.0)
MCHC: 33.5 g/dL (ref 31.5–35.7)
MCV: 98 fL — ABNORMAL HIGH (ref 79–97)
MONOCYTES: 8 %
Monocytes Absolute: 0.6 10*3/uL (ref 0.1–0.9)
NEUTROS PCT: 66 %
Neutrophils Absolute: 4.8 10*3/uL (ref 1.4–7.0)
PLATELETS: 192 10*3/uL (ref 150–379)
RBC: 4.13 x10E6/uL — ABNORMAL LOW (ref 4.14–5.80)
RDW: 14.7 % (ref 12.3–15.4)
WBC: 7.3 10*3/uL (ref 3.4–10.8)

## 2016-01-05 LAB — TSH: TSH: 5.21 u[IU]/mL — ABNORMAL HIGH (ref 0.450–4.500)

## 2016-01-05 NOTE — Telephone Encounter (Signed)
Attempted to return patients call. Line busy x 2.

## 2016-01-05 NOTE — Telephone Encounter (Signed)
Lmtcb.

## 2016-01-05 NOTE — Telephone Encounter (Signed)
Patient requesting his labs to be sent to the [facility] fax number [phone] and also his last A1c sent. Labs faxed as patient requested.

## 2016-01-05 NOTE — Telephone Encounter (Signed)
Patient aware of lab results.

## 2016-01-06 NOTE — Telephone Encounter (Signed)
He said it was already taken care of

## 2016-01-07 DIAGNOSIS — H40033 Anatomical narrow angle, bilateral: Secondary | ICD-10-CM | POA: Diagnosis not present

## 2016-01-07 DIAGNOSIS — Z961 Presence of intraocular lens: Secondary | ICD-10-CM | POA: Diagnosis not present

## 2016-01-07 DIAGNOSIS — E113393 Type 2 diabetes mellitus with moderate nonproliferative diabetic retinopathy without macular edema, bilateral: Secondary | ICD-10-CM | POA: Diagnosis not present

## 2016-01-07 LAB — HM DIABETES EYE EXAM

## 2016-01-13 ENCOUNTER — Encounter: Payer: Self-pay | Admitting: Family

## 2016-01-13 ENCOUNTER — Ambulatory Visit (INDEPENDENT_AMBULATORY_CARE_PROVIDER_SITE_OTHER): Payer: Medicare Other | Admitting: Family

## 2016-01-13 VITALS — BP 128/69 | HR 80 | Temp 96.5°F | Ht 73.0 in | Wt 272.0 lb

## 2016-01-13 DIAGNOSIS — R300 Dysuria: Secondary | ICD-10-CM | POA: Diagnosis not present

## 2016-01-13 DIAGNOSIS — R32 Unspecified urinary incontinence: Secondary | ICD-10-CM | POA: Diagnosis not present

## 2016-01-13 DIAGNOSIS — N390 Urinary tract infection, site not specified: Secondary | ICD-10-CM

## 2016-01-13 LAB — URINALYSIS, COMPLETE
Bilirubin, UA: NEGATIVE
KETONES UA: NEGATIVE
NITRITE UA: NEGATIVE
Protein, UA: NEGATIVE
UUROB: 0.2 mg/dL (ref 0.2–1.0)
pH, UA: 5 (ref 5.0–7.5)

## 2016-01-13 LAB — MICROSCOPIC EXAMINATION

## 2016-01-13 MED ORDER — CIPROFLOXACIN HCL 500 MG PO TABS: 500.0000 mg | ORAL_TABLET | Freq: Two times a day (BID) | ORAL | 0 refills | Status: DC

## 2016-01-13 NOTE — Addendum Note (Signed)
Addended by: Evelina Dun A on: 01/13/2016 11:46 AM   Modules accepted: Orders

## 2016-01-13 NOTE — Patient Instructions (Signed)

## 2016-01-13 NOTE — Progress Notes (Signed)
   Subjective:    Patient ID: [name], male    DOB: 1945/12/29, 70 y.o.   MRN: [id number]  Dizziness  This is a new problem. The current episode started yesterday. The problem occurs intermittently. The problem has been unchanged. Associated symptoms include urinary symptoms. Pertinent negatives include no chills, congestion, joint swelling, nausea or vomiting.  Dysuria   This is a new problem. The current episode started in the past 7 days. The problem occurs intermittently. The problem has been unchanged. The quality of the pain is described as burning. The pain is at a severity of 2/10. The pain is mild. Associated symptoms include frequency and urgency. Pertinent negatives include no chills, hematuria, nausea or vomiting. He has tried increased fluids for the symptoms. The treatment provided mild relief.   Pt complaining of urinary incontinence that started that has been going for months.    Review of Systems  Constitutional: Negative for chills.  HENT: Negative for congestion.   Gastrointestinal: Negative for nausea and vomiting.  Genitourinary: Positive for dysuria, frequency and urgency. Negative for hematuria.  Musculoskeletal: Negative for joint swelling.  Neurological: Positive for dizziness.  All other systems reviewed and are negative.      Objective:   Physical Exam  Constitutional: He is oriented to person, place, and time. He appears well-developed and well-nourished. No distress.  HENT:  Head: Normocephalic.  Eyes: Pupils are equal, round, and reactive to light. Right eye exhibits no discharge. Left eye exhibits no discharge.  Neck: Normal range of motion. Neck supple. No thyromegaly present.  Cardiovascular: Normal rate, regular rhythm, normal heart sounds and intact distal pulses.   No murmur heard. Pulmonary/Chest: Effort normal and breath sounds normal. No respiratory distress. He has no wheezes.  Abdominal: Soft. Bowel sounds are normal. He exhibits no  distension. There is no tenderness.  Musculoskeletal: Normal range of motion. He exhibits no edema or tenderness.  Negative for CVA tenderness   Neurological: He is alert and oriented to person, place, and time.  Skin: Skin is warm and dry. No rash noted. No erythema.  Psychiatric: He has a normal mood and affect. His behavior is normal. Judgment and thought content normal.  Vitals reviewed.     BP 128/69   Pulse 80   Temp (!) 96.5 F (35.8 C) (Oral)   Ht 6\' 1"  (1.854 m)   Wt 272 lb (123.4 kg)   BMI 35.89 kg/m      Assessment & Plan:  1. Dysuria - Urinalysis, Complete  2. Urinary tract infection without hematuria, site unspecified Force fluids AZO over the counter X2 days RTO prn Culture pending - ciprofloxacin (CIPRO) 500 MG tablet; Take 1 tablet (500 mg total) by mouth 2 (two) times daily.  Dispense: 20 tablet; Refill: 0  3. Urinary incontinence, unspecified type - Ambulatory referral to Urology  Evelina Dun, FNP

## 2016-01-17 LAB — URINE CULTURE

## 2016-01-25 HISTORY — PX: PACEMAKER INSERTION: SHX728

## 2016-02-02 ENCOUNTER — Telehealth: Payer: Self-pay | Admitting: Family Medicine

## 2016-02-02 MED ORDER — LEVOTHYROXINE SODIUM 200 MCG PO TABS
200.0000 ug | ORAL_TABLET | Freq: Every day | ORAL | 3 refills | Status: DC
Start: 2016-02-02 — End: 2017-04-17

## 2016-02-02 NOTE — Telephone Encounter (Signed)
Returned Linda's phone call at [place] regarding patient's current dose of Levothyroxine.  Rx Sent to pharmacy with listed dosage of 241mcg

## 2016-02-03 DIAGNOSIS — E1140 Type 2 diabetes mellitus with diabetic neuropathy, unspecified: Secondary | ICD-10-CM | POA: Diagnosis not present

## 2016-02-03 DIAGNOSIS — E1151 Type 2 diabetes mellitus with diabetic peripheral angiopathy without gangrene: Secondary | ICD-10-CM | POA: Diagnosis not present

## 2016-02-03 DIAGNOSIS — L609 Nail disorder, unspecified: Secondary | ICD-10-CM | POA: Diagnosis not present

## 2016-02-03 DIAGNOSIS — L110 Acquired keratosis follicularis: Secondary | ICD-10-CM | POA: Diagnosis not present

## 2016-02-18 ENCOUNTER — Other Ambulatory Visit: Payer: Self-pay | Admitting: Nurse Practitioner

## 2016-03-17 ENCOUNTER — Telehealth: Payer: Self-pay | Admitting: Family Medicine

## 2016-03-17 NOTE — Telephone Encounter (Signed)
Patient states that BG has been low recently.  BG was 89 this AM.   AM usually less than 100.  BG has been in 50's around 3am.  He has been taking Levemir 54 units bid but our records show that he should have been taking 45 units BID.  Patient advised to decrease to 45 units bid.  Repeated instructions.  Made appt to f/u with PCP 04/05/16.

## 2016-03-18 ENCOUNTER — Telehealth: Payer: Self-pay | Admitting: Family Medicine

## 2016-03-18 ENCOUNTER — Ambulatory Visit: Payer: Medicare Other | Admitting: Urology

## 2016-03-18 NOTE — Telephone Encounter (Signed)
Pt cancelled appt with [facility]

## 2016-04-05 ENCOUNTER — Ambulatory Visit: Payer: Self-pay | Admitting: Family Medicine

## 2016-04-11 ENCOUNTER — Ambulatory Visit (INDEPENDENT_AMBULATORY_CARE_PROVIDER_SITE_OTHER): Payer: Medicare Other | Admitting: Family Medicine

## 2016-04-11 ENCOUNTER — Encounter: Payer: Self-pay | Admitting: Family Medicine

## 2016-04-11 VITALS — BP 145/67 | HR 83 | Temp 96.8°F | Ht 73.0 in | Wt 272.8 lb

## 2016-04-11 DIAGNOSIS — I10 Essential (primary) hypertension: Secondary | ICD-10-CM | POA: Diagnosis not present

## 2016-04-11 DIAGNOSIS — N401 Enlarged prostate with lower urinary tract symptoms: Secondary | ICD-10-CM | POA: Diagnosis not present

## 2016-04-11 DIAGNOSIS — E119 Type 2 diabetes mellitus without complications: Secondary | ICD-10-CM | POA: Diagnosis not present

## 2016-04-11 DIAGNOSIS — R350 Frequency of micturition: Secondary | ICD-10-CM

## 2016-04-11 LAB — URINALYSIS, COMPLETE
BILIRUBIN UA: NEGATIVE
KETONES UA: NEGATIVE
NITRITE UA: NEGATIVE
PH UA: 6 (ref 5.0–7.5)
PROTEIN UA: NEGATIVE
Specific Gravity, UA: 1.01 (ref 1.005–1.030)
UUROB: 0.2 mg/dL (ref 0.2–1.0)

## 2016-04-11 LAB — MICROSCOPIC EXAMINATION
Bacteria, UA: NONE SEEN
WBC, UA: >30 /hpf — AB (ref 0–?)

## 2016-04-11 LAB — BAYER DCA HB A1C WAIVED: HB A1C: 8.2 % — AB (ref <7.0)

## 2016-04-11 MED ORDER — CEPHALEXIN 500 MG PO CAPS: 500.0000 mg | ORAL_CAPSULE | Freq: Three times a day (TID) | ORAL | 0 refills | Status: DC

## 2016-04-11 NOTE — Progress Notes (Signed)
   HPI  Patient presents today here for follow-up chronic medical conditions.  Type 2 diabetes Patient is now taking the victoza and insulin. States that fasting blood sugars are generally 125-150. He has had many blood sugars in the 50s and 60s at night recently. He states these been eating peanut butter before bed to adjust this.  Frequent urination Has been going on persistently, not much improved on Flomax.  Boil Patient had a boil on his right upper back, about one week ago it burst having bloody and purulent discharge Denies fever, chills, sweats, or any pain in the area. He feels that it's healing well.  PMH: Smoking status noted ROS: Per HPI  Objective: BP (!) 145/67   Pulse 83   Temp (!) 96.8 F (36 C) (Oral)   Ht _0  (1.854 m)   Wt 272 lb 12.8 oz (123.7 kg)   BMI 35.99 kg/m  Gen: NAD, alert, cooperative with exam HEENT: NCAT, EOMI, PERRL CV: RRR, good A0/T6, 2/6 systolic murmur Resp: CTABL, no wheezes, non-labored Ext: No edema, warm Neuro: Alert and oriented Skin Approximately 1 cm in diameter roughly circular lesion with erythema and heme crusting consistent with ruptured cyst.  Assessment and plan:  # BPH, urinary frequency Worsening BPH versus UTI Urine culture, Keflex. Increase Flomax if no UTI  # Boil Improving, considering possible UTI of used Keflex to cover strep and staph, as well as most forms of Escherichia coli in the urine  # Type 2 diabetes A1c pending From his fasting blood sugars his control has improved quite a bit  # Hypertension Elevated today, however not severely elevated Continue Lasix only, monitor for need of more medication    Orders Placed This Encounter  Procedures  . Urine culture  . Urinalysis, Complete  . PSA  . CMP14+EGFR  . Bayer DCA Hb A1c Waived    Meds ordered this encounter  Medications  . cephALEXin (KEFLEX) 500 MG capsule    Sig: Take 1 capsule (500 mg total) by mouth 3 (three) times daily.   Dispense:  21 capsule    Refill:  Rico, MD [place] 04/11/2016, 8:23 AM

## 2016-04-11 NOTE — Patient Instructions (Signed)
Great to see you!  Come back in 3 month sunless you need [name] sooner to discuss diabetes  Cut your insulin dose back to 40 units at night, keep taking the 45 units in the morning.

## 2016-04-12 ENCOUNTER — Telehealth: Payer: Self-pay | Admitting: Family Medicine

## 2016-04-12 LAB — CMP14+EGFR
ALK PHOS: 57 IU/L (ref 39–117)
ALT: 23 IU/L (ref 0–44)
AST: 23 IU/L (ref 0–40)
Albumin/Globulin Ratio: 1.6 (ref 1.2–2.2)
Albumin: 4.1 g/dL (ref 3.5–4.8)
BUN/Creatinine Ratio: 20 (ref 10–24)
BUN: 20 mg/dL (ref 8–27)
Bilirubin Total: 0.4 mg/dL (ref 0.0–1.2)
CO2: 23 mmol/L (ref 18–29)
CREATININE: 1.02 mg/dL (ref 0.76–1.27)
Calcium: 8.8 mg/dL (ref 8.6–10.2)
Chloride: 100 mmol/L (ref 96–106)
GFR calc Af Amer: 84 mL/min/{1.73_m2} (ref >59)
GFR calc non Af Amer: 73 mL/min/{1.73_m2} (ref >59)
GLUCOSE: 231 mg/dL — AB (ref 65–99)
Globulin, Total: 2.6 g/dL (ref 1.5–4.5)
Potassium: 4.6 mmol/L (ref 3.5–5.2)
SODIUM: 137 mmol/L (ref 134–144)
Total Protein: 6.7 g/dL (ref 6.0–8.5)

## 2016-04-12 LAB — PSA: PROSTATE SPECIFIC AG, SERUM: 0.3 ng/mL (ref 0.0–4.0)

## 2016-04-12 NOTE — Telephone Encounter (Signed)
Aware of results. 

## 2016-04-13 LAB — URINE CULTURE

## 2016-04-14 NOTE — Progress Notes (Signed)
Patient says he is some better. Will come back if he gets worse.

## 2016-04-26 DIAGNOSIS — E113493 Type 2 diabetes mellitus with severe nonproliferative diabetic retinopathy without macular edema, bilateral: Secondary | ICD-10-CM | POA: Diagnosis not present

## 2016-04-26 DIAGNOSIS — H3563 Retinal hemorrhage, bilateral: Secondary | ICD-10-CM | POA: Diagnosis not present

## 2016-04-26 DIAGNOSIS — H35043 Retinal micro-aneurysms, unspecified, bilateral: Secondary | ICD-10-CM | POA: Diagnosis not present

## 2016-04-26 DIAGNOSIS — H43811 Vitreous degeneration, right eye: Secondary | ICD-10-CM | POA: Diagnosis not present

## 2016-04-26 LAB — HM DIABETES EYE EXAM

## 2016-04-29 ENCOUNTER — Ambulatory Visit (INDEPENDENT_AMBULATORY_CARE_PROVIDER_SITE_OTHER): Payer: Medicare Other

## 2016-04-29 ENCOUNTER — Ambulatory Visit (INDEPENDENT_AMBULATORY_CARE_PROVIDER_SITE_OTHER): Payer: Medicare Other | Admitting: Pediatrics

## 2016-04-29 ENCOUNTER — Encounter: Payer: Self-pay | Admitting: Pediatrics

## 2016-04-29 VITALS — BP 140/74 | HR 67 | Temp 97.1°F | Ht 73.0 in | Wt 273.0 lb

## 2016-04-29 DIAGNOSIS — W19XXXA Unspecified fall, initial encounter: Secondary | ICD-10-CM | POA: Diagnosis not present

## 2016-04-29 DIAGNOSIS — S61412A Laceration without foreign body of left hand, initial encounter: Secondary | ICD-10-CM

## 2016-04-29 DIAGNOSIS — M25532 Pain in left wrist: Secondary | ICD-10-CM | POA: Diagnosis not present

## 2016-04-29 DIAGNOSIS — R399 Unspecified symptoms and signs involving the genitourinary system: Secondary | ICD-10-CM

## 2016-04-29 DIAGNOSIS — T148XXA Other injury of unspecified body region, initial encounter: Secondary | ICD-10-CM

## 2016-04-29 DIAGNOSIS — R309 Painful micturition, unspecified: Secondary | ICD-10-CM | POA: Diagnosis not present

## 2016-04-29 LAB — URINALYSIS, COMPLETE
Bilirubin, UA: NEGATIVE
KETONES UA: NEGATIVE
NITRITE UA: NEGATIVE
Protein, UA: NEGATIVE
Specific Gravity, UA: 1.01 (ref 1.005–1.030)
UUROB: 0.2 mg/dL (ref 0.2–1.0)
pH, UA: 5 (ref 5.0–7.5)

## 2016-04-29 LAB — MICROSCOPIC EXAMINATION: Renal Epithel, UA: NONE SEEN /hpf

## 2016-04-29 MED ORDER — TRIPLE ANTIBIOTIC 5-400-5000 EX OINT: TOPICAL_OINTMENT | Freq: Two times a day (BID) | CUTANEOUS | 0 refills | Status: DC

## 2016-04-29 NOTE — Progress Notes (Signed)
  Subjective:   Patient ID: KYANDRE OKRAY, male    DOB: 1945/05/10, 71 y.o.   MRN: [id number] CC: Fall (3 days ago) and Painful urination  HPI: ZADRIAN MCCAULEY is a 71 y.o. male presenting for Fall (3 days ago) and Painful urination  Fall: 3 days ago slid off of porch without railings, fell on his bottom, has had some soreness R buttock since then L wrist has also been sore Has 3 skin tears-- on dorsum of L hand, and forearm Was not his porch, porch has since gotten railings  Feeling well otherwise No burning with urination Recently had UTI, was treated with keflex Multiple urine cultures with multiple organisims no fevers, no abd pain Has [facility]  Relevant past medical, surgical, family and social history reviewed. Allergies and medications reviewed and updated. History  Smoking Status  . Former Smoker  . Packs/day: 0.00  . Years: 12.00  . Types: Cigarettes  . Quit date: 09/20/1961  Smokeless Tobacco  . Never Used   ROS: Per HPI   Objective:    BP 140/74   Pulse 67   Temp 97.1 F (36.2 C) (Oral)   Ht 6\' 1"  (1.854 m)   Wt 273 lb (123.8 kg)   BMI 36.02 kg/m   Wt Readings from Last 3 Encounters:  04/29/16 273 lb (123.8 kg)  04/11/16 272 lb 12.8 oz (123.7 kg)  01/13/16 272 lb (123.4 kg)    Gen: NAD, alert, cooperative with exam, NCAT EYES: EOMI, no conjunctival injection, or no icterus CV: NRRR, normal S1/S2 Resp: CTABL, no wheezes, normal WOB Abd: +BS, soft, NTND. no guarding or organomegaly Ext: No edema, warm Neuro: Alert and oriented, strength equal b/l UE and LE, coordination grossly normal MSK: no ttp over spine, no snuffbox tenderness L wrist Tender with ROM though skin tear over wrist as well Skin: skin tear apprx 3cm by 2cm L dorsum of hand, pink base, no surrounding redness L forearm with smaller <1cm skin tear with dried edges, serous discharge, no surrounding redness, no tenderness around skin tears.    Third well healing skin tear Slight bruising present R upper buttock  Preliminary read by Assunta Found, MD:  No acute fracture, will f/u final read  Assessment & Plan:  [place] was seen today for fall and painful urination.  Diagnoses and all orders for this visit:  Left wrist pain Pain likely from skin tear, some joint tenderness with palpation but also with skin tear in area f/u wrist final read -     DG Wrist Complete Left; Future  Urinary symptom +UA, pt with difficult time getting clean catch and no current symptoms Will not send for culture or treat Discussed return precautions Has upcoming repeat circumcision -     Urinalysis, Complete  Fall, initial encounter [place] off of porch without railings Discussed fall precautions  Skin tear of left hand without complication, initial encounter -     neomycin-bacitracin-polymyxin (NEOSPORIN) [phone] ointment; Apply topically 2 (two) times daily.  Bruise Slight, On R buttock  Follow up plan: With pcp as scheduled Assunta Found, MD [place]

## 2016-05-06 ENCOUNTER — Telehealth: Payer: Self-pay | Admitting: Family Medicine

## 2016-05-09 ENCOUNTER — Telehealth: Payer: Self-pay | Admitting: Family Medicine

## 2016-05-09 MED ORDER — INSULIN DETEMIR 100 UNIT/ML ~~LOC~~ SOLN: 42.0000 [IU] | Freq: Two times a day (BID) | SUBCUTANEOUS | Status: DC

## 2016-05-09 MED ORDER — LIRAGLUTIDE 18 MG/3ML ~~LOC~~ SOPN: 1.8000 mg | PEN_INJECTOR | Freq: Every day | SUBCUTANEOUS | 0 refills | Status: DC

## 2016-05-09 NOTE — Telephone Encounter (Signed)
Patient states BG has increased to 300's  Recommended that he increase Victoza to 1.8mg  qd and also increase Levemir from 40 units bid to 42 units bid.  Continue to check BG at least twice daily.  He is to call office if BG does not start to decrease.

## 2016-05-14 DIAGNOSIS — R319 Hematuria, unspecified: Secondary | ICD-10-CM | POA: Diagnosis not present

## 2016-05-14 DIAGNOSIS — Z87442 Personal history of urinary calculi: Secondary | ICD-10-CM | POA: Diagnosis not present

## 2016-05-14 DIAGNOSIS — N3001 Acute cystitis with hematuria: Secondary | ICD-10-CM | POA: Diagnosis not present

## 2016-05-14 DIAGNOSIS — Z794 Long term (current) use of insulin: Secondary | ICD-10-CM | POA: Diagnosis not present

## 2016-05-14 DIAGNOSIS — J449 Chronic obstructive pulmonary disease, unspecified: Secondary | ICD-10-CM | POA: Diagnosis not present

## 2016-05-14 DIAGNOSIS — N3289 Other specified disorders of bladder: Secondary | ICD-10-CM | POA: Diagnosis not present

## 2016-05-14 DIAGNOSIS — Z87891 Personal history of nicotine dependence: Secondary | ICD-10-CM | POA: Diagnosis not present

## 2016-05-14 DIAGNOSIS — Z79899 Other long term (current) drug therapy: Secondary | ICD-10-CM | POA: Diagnosis not present

## 2016-05-14 DIAGNOSIS — Z952 Presence of prosthetic heart valve: Secondary | ICD-10-CM | POA: Diagnosis not present

## 2016-05-14 DIAGNOSIS — E1165 Type 2 diabetes mellitus with hyperglycemia: Secondary | ICD-10-CM | POA: Diagnosis not present

## 2016-05-14 DIAGNOSIS — N390 Urinary tract infection, site not specified: Secondary | ICD-10-CM | POA: Diagnosis not present

## 2016-05-14 DIAGNOSIS — I509 Heart failure, unspecified: Secondary | ICD-10-CM | POA: Diagnosis not present

## 2016-05-17 ENCOUNTER — Telehealth: Payer: Self-pay

## 2016-05-17 DIAGNOSIS — N390 Urinary tract infection, site not specified: Secondary | ICD-10-CM

## 2016-05-17 NOTE — Telephone Encounter (Signed)
Patient aware that referral to Urology has been placed and appt made for hospital follow up

## 2016-05-17 NOTE — Telephone Encounter (Signed)
Urology referral placed  [name] Apple, MD [facility] 05/17/2016, 9:01 AM

## 2016-05-18 ENCOUNTER — Ambulatory Visit (INDEPENDENT_AMBULATORY_CARE_PROVIDER_SITE_OTHER): Payer: Medicare Other | Admitting: Pharmacist

## 2016-05-18 ENCOUNTER — Encounter: Payer: Self-pay | Admitting: Pharmacist

## 2016-05-18 VITALS — BP 134/68 | HR 72 | Ht 73.0 in | Wt 271.0 lb

## 2016-05-18 DIAGNOSIS — E1165 Type 2 diabetes mellitus with hyperglycemia: Secondary | ICD-10-CM

## 2016-05-18 DIAGNOSIS — Z794 Long term (current) use of insulin: Secondary | ICD-10-CM

## 2016-05-18 MED ORDER — INSULIN DETEMIR 100 UNIT/ML ~~LOC~~ SOLN: 48.0000 [IU] | Freq: Two times a day (BID) | SUBCUTANEOUS | Status: DC

## 2016-05-18 NOTE — Patient Instructions (Signed)
Increase Levemir to 48 units twice a day.   Check into coverage for Tresiba with [facility] [employer]

## 2016-05-18 NOTE — Progress Notes (Signed)
Patient ID: [name], male   DOB: [birth year], 71 y.o.   MRN: [id number]   Subjective:    [name] is a 71 y.o. male who presents for type 2 DM requiring insulin.  Mr. Munday has questions about his medication and thinks that he might need adjustment in his diabetes medications.  He was recently started on Septra and reports that BG has been lower over the last week thought I am not sure this is related to Septra but rather increase in Victoza and levemir that occurred 04/16/218 via phone conversation.   He is currently taking Levemir 42 units BID, VIctoza 1.8mg  SQ qd and Metformin 1000mg  bid.     HBG readings: 156 - 278 Checking BG 2 times daily  Known diabetic complications: peripheral neuropathy and cardiovascular disease Cardiovascular risk factors: advanced age (older than 78 for men, 20 for women), diabetes mellitus, dyslipidemia, hypertension, male gender and obesity (BMI >= 30 kg/m2)  Diet -  Breakfast = 3 shredded wheat biscuits with 25 milk  Lunch = beans, hamburger with no bun Supper = vegetables - squash, beets, okra, beans + meat Drinks = water, milk, unsweet tea and diet soda.   Objective:    BP 134/68   Pulse 72   Ht 6\' 1"  (1.854 m)   Wt 271 lb (122.9 kg)   BMI 35.75 kg/m   A1c = 8.2% (04/11/2016) A1c = 9.8% (11/10/2015 at [facility]) A1c = 10.6% (10/02/2015)  Lab Review Glucose (mg/dL)  Date Value  04/11/2016 231 (H)  01/04/2016 196 (H)  10/02/2015 293 (H)   Glucose, Bld (mg/dL)  Date Value  05/25/2015 111 (H)  05/24/2015 229 (H)  05/23/2015 180 (H)   CO2 (mmol/L)  Date Value  04/11/2016 23  01/04/2016 24  10/02/2015 26   BUN (mg/dL)  Date Value  04/11/2016 20  01/04/2016 20  10/02/2015 18   Creat (mg/dL)  Date Value  08/02/2012 1.16  04/16/2012 1.18   Creatinine, Ser (mg/dL)  Date Value  04/11/2016 1.02  01/04/2016 1.23  10/02/2015 1.21     Assessment:    Diabetes Mellitus type II, under improving but still  inadequate  control.   Obesity - weight loss to help with both DM and decrease complications of surgery   Plan:    1.  Continue Victoza 1.8mg  SQ daily and metformin 1000mg  bid  Increase Levemir 48 units bid - patient is also to check coverage of Tresiba by his insurance.  2.   Recommend continue to check BG 2  times a day 3.  Continue as planned to follow up with PCP in 1 week.  Follow up with me in 6 weeks.

## 2016-05-19 ENCOUNTER — Telehealth: Payer: Self-pay | Admitting: Family Medicine

## 2016-05-19 NOTE — Telephone Encounter (Signed)
Tried to call patient - no ans / left message

## 2016-05-24 NOTE — Telephone Encounter (Signed)
Patient called [name] and they do not cover [place] yet without PA.  I also checked his Seltzer and they do not have [place] listed in formulary.  Will continue with Levemir 48 units BID.

## 2016-05-25 ENCOUNTER — Ambulatory Visit: Payer: Medicare Other | Admitting: Family Medicine

## 2016-06-03 ENCOUNTER — Ambulatory Visit (INDEPENDENT_AMBULATORY_CARE_PROVIDER_SITE_OTHER): Payer: Medicare Other | Admitting: Nurse Practitioner

## 2016-06-03 ENCOUNTER — Encounter: Payer: Self-pay | Admitting: Nurse Practitioner

## 2016-06-03 VITALS — BP 126/68 | HR 82 | Temp 96.8°F | Ht 73.0 in | Wt 274.0 lb

## 2016-06-03 DIAGNOSIS — N3001 Acute cystitis with hematuria: Secondary | ICD-10-CM | POA: Diagnosis not present

## 2016-06-03 DIAGNOSIS — R300 Dysuria: Secondary | ICD-10-CM | POA: Diagnosis not present

## 2016-06-03 LAB — URINALYSIS
Bilirubin, UA: NEGATIVE
Ketones, UA: NEGATIVE
NITRITE UA: NEGATIVE
PH UA: 6.5 (ref 5.0–7.5)
Protein, UA: NEGATIVE
Specific Gravity, UA: 1.01 (ref 1.005–1.030)
Urobilinogen, Ur: 0.2 mg/dL (ref 0.2–1.0)

## 2016-06-03 MED ORDER — CIPROFLOXACIN HCL 500 MG PO TABS: 500.0000 mg | ORAL_TABLET | Freq: Two times a day (BID) | ORAL | 0 refills | Status: DC

## 2016-06-03 NOTE — Progress Notes (Signed)
   Subjective:    Patient ID: [name], male    DOB: 25-Apr-1945, 71 y.o.   MRN: [id number]  HPI Patient comes in today c/o dysuria, frequency and urgency. He had a uti 2 weeks ago and was treated - culture grew 2 bacteria but no sensitivity is on report.     Review of Systems  Constitutional: Negative for chills and fever.  Respiratory: Negative.   Cardiovascular: Negative.   Genitourinary: Positive for dysuria, frequency and urgency.  Neurological: Negative.   Psychiatric/Behavioral: Negative.   All other systems reviewed and are negative.      Objective:   Physical Exam  Constitutional: He is oriented to person, place, and time. He appears well-developed and well-nourished. No distress.  Cardiovascular: Normal rate and regular rhythm.   Pulmonary/Chest: Effort normal and breath sounds normal.  Abdominal: There is no tenderness.  Genitourinary:  Genitourinary Comments: No cva tenderness  Neurological: He is alert and oriented to person, place, and time.  Skin: Skin is warm.  Psychiatric: He has a normal mood and affect. His behavior is normal. Judgment and thought content normal.   BP 126/68   Pulse 82   Temp (!) 96.8 F (36 C) (Oral)   Ht 6\' 1"  (1.854 m)   Wt 274 lb (124.3 kg)   BMI 36.15 kg/m   UA- (+) leuks trace of blood     Assessment & Plan:  1. Dysuria - Urinalysis  2. Acute cystitis with hematuria Take medication as prescribe Cotton underwear Take shower not bath Cranberry juice, yogurt Force fluids AZO over the counter X2 days Culture pending RTO prn  - ciprofloxacin (CIPRO) 500 MG tablet; Take 1 tablet (500 mg total) by mouth 2 (two) times daily.  Dispense: 20 tablet; Refill: 0 - Urine culture  Mary-Margaret Hassell Done, FNP

## 2016-06-03 NOTE — Patient Instructions (Addendum)
Urinary Frequency, Adult Urinary frequency means urinating more often than usual. People with urinary frequency urinate at least 8 times in 24 hours, even if they drink a normal amount of fluid. Although they urinate more often than normal, the total amount of urine produced in a day may be normal. Urinary frequency is also called pollakiuria. What are the causes? This condition may be caused by:  A urinary tract infection.  Obesity.  Bladder problems, such as bladder stones.  Caffeine or alcohol.  Eating food or drinking fluids that irritate the bladder. These include coffee, tea, soda, artificial sweeteners, citrus, tomato-based foods, and chocolate.  Certain medicines, such as medicines that help the body get rid of extra fluid (diuretics).  Muscle or nerve weakness.  Overactive bladder.  Chronic diabetes.  Interstitial cystitis.  In men, problems with the prostate, such as an enlarged prostate.  In women, pregnancy. In some cases, the cause may not be known. What increases the risk? This condition is more likely to develop in:  Women who have gone through menopause.  Men with prostate problems.  People with a disease or injury that affects the nerves or spinal cord.  People who have or have had a condition that affects the brain, such as a stroke. What are the signs or symptoms? Symptoms of this condition include:  Feeling an urgent need to urinate often. The stress and anxiety of needing to find a bathroom quickly can make this urge worse.  Urinating 8 or more times in 24 hours.  Urinating as often as every 1 to 2 hours. How is this diagnosed? This condition is diagnosed based on your symptoms, your medical history, and a physical exam. You may have tests, such as:  Blood tests.  Urine tests.  Imaging tests, such as X-rays or ultrasounds.  A bladder test.  A test of your neurological system. This is the body system that senses the need to urinate.  A  test to check for problems in the urethra and bladder called cystoscopy. You may also be asked to keep a bladder diary. A bladder diary is a record of what you eat and drink, how often you urinate, and how much you urinate. You may need to see a health care provider who specializes in conditions of the urinary tract (urologist) or kidneys (nephrologist). How is this treated? Treatment for this condition depends on the cause. Sometimes the condition goes away on its own and treatment is not necessary. If treatment is needed, it may include:  Taking medicine.  Learning exercises that strengthen the muscles that help control urination.  Following a bladder training program. This may include:  Learning to delay going to the bathroom.  Double urinating (voiding). This helps if you are not completely emptying your bladder.  Scheduled voiding.  Making diet changes, such as:  Avoiding caffeine.  Drinking fewer fluids, especially alcohol.  Not drinking in the evening.  Not having foods or drinks that may irritate the bladder.  Eating foods that help prevent or ease constipation. Constipation can make this condition worse.  Having the nerves in your bladder stimulated. There are two options for stimulating the nerves to your bladder:  Outpatient electrical nerve stimulation. This is done by your health care provider.  Surgery to implant a bladder pacemaker. The pacemaker helps to control the urge to urinate. Follow these instructions at home:  Keep a bladder diary if told to by your health care provider.  Take over-the-counter and prescription medicines only as   told by your health care provider.  Do any exercises as told by your health care provider.  Follow a bladder training program as told by your health care provider.  Make any recommended diet changes.  Keep all follow-up visits as told by your health care provider. This is important. Contact a health care provider  if:  You start urinating more often.  You feel pain or irritation when you urinate.  You notice blood in your urine.  Your urine looks cloudy.  You develop a fever.  You begin vomiting. Get help right away if:  You are unable to urinate. This information is not intended to replace advice given to you by your health care provider. Make sure you discuss any questions you have with your health care provider. Document Released: 11/06/2008 Document Revised: 02/11/2015 Document Reviewed: 08/06/2014 Elsevier Interactive Patient Education  2017 [employer]  

## 2016-06-05 LAB — URINE CULTURE

## 2016-06-10 ENCOUNTER — Ambulatory Visit (INDEPENDENT_AMBULATORY_CARE_PROVIDER_SITE_OTHER): Payer: Medicare Other | Admitting: Family Medicine

## 2016-06-10 ENCOUNTER — Encounter: Payer: Self-pay | Admitting: Family Medicine

## 2016-06-10 VITALS — BP 139/54 | HR 80 | Temp 96.8°F | Ht 73.0 in | Wt 275.4 lb

## 2016-06-10 DIAGNOSIS — R3129 Other microscopic hematuria: Secondary | ICD-10-CM

## 2016-06-10 DIAGNOSIS — R42 Dizziness and giddiness: Secondary | ICD-10-CM

## 2016-06-10 NOTE — Progress Notes (Signed)
   HPI  Patient presents today here with dizziness.  Patient's lines are last 3 days he's had unsteadiness type dizziness. Otherwise he feels well.  He states that he took Cipro for 7 days but states that he could not tell that it improved anything. He stopped taking it yesterday. Today he has dizziness seems to be slightly better.  He denies headache, earache, room spinning sensation, worsening with head turning, or dizziness with first standing up or getting out of bed.  No fever, chills, sweats.  PMH: Smoking status noted ROS: Per HPI  Objective: BP (!) 139/54   Pulse 80   Temp (!) 96.8 F (36 C) (Oral)   Ht 6\' 1"  (1.854 m)   Wt 275 lb 6.4 oz (124.9 kg)   BMI 36.33 kg/m  Gen: NAD, alert, cooperative with exam HEENT: NCAT, EOMI, PERRL, TMs normal bilaterally, oropharynx clear CV: RRR, good S1/S2, no murmur Resp: CTABL, no wheezes, non-labored Ext: No edema, warm Neuro: Alert and oriented,  Strength 5/5 and sensation intact in all 4 extremities, cranial nerves II through XII intact with very slight rightward deviation of the tongue on protrusion  Assessment and plan:  # Dizziness Unclear etiology, most likely side effect from recent ciprofloxacin use Patient has had improvement with stopping medication one day ago I discussed signs and symptoms of stroke, however I do not believe this is the etiology, I did recommend very low threshold for further medical evaluation if any of these occur. This is not sound like vertigo or orthostasis.  # Microscopic hematuria Patient with recent microscopic hematuria, urine culture showed makes urogenital flora Patient has urology follow-up scheduled in about 3 weeks   Laroy Apple, MD [facility] 06/10/2016, 1:28 PM

## 2016-06-10 NOTE — Patient Instructions (Addendum)
Great to see you!  I wonder if you dizziness is not due to the cipro. I think that you will get better now that you are off of it.   Be sure to get plenty of fluids and please let me know if your symptoms do not get better over the next few days.     Dizziness Dizziness is a common problem. It makes you feel unsteady or light-headed. You may feel like you are about to pass out (faint). Dizziness can lead to getting hurt if you stumble or fall. Dizziness can be caused by many things, including:  Medicines.  Not having enough water in your body (dehydration).  Illness. Follow these instructions at home: Eating and drinking   Drink enough fluid to keep your pee (urine) clear or pale yellow. This helps to keep you from getting dehydrated. Try to drink more clear fluids, such as water.  Do not drink alcohol.  Limit how much caffeine you drink or eat, if your doctor tells you to do that.  Limit how much salt (sodium) you drink or eat, if your doctor tells you to do that. Activity   Avoid making quick movements.  When you stand up from sitting in a chair, steady yourself until you feel okay.  In the morning, first sit up on the side of the bed. When you feel okay, stand slowly while you hold onto something. Do this until you know that your balance is fine.  If you need to stand in one place for a long time, move your legs often. Tighten and relax the muscles in your legs while you are standing.  Do not drive or use heavy machinery if you feel dizzy.  Avoid bending down if you feel dizzy. Place items in your home so you can reach them easily without leaning over. Lifestyle   Do not use any products that contain nicotine or tobacco, such as cigarettes and e-cigarettes. If you need help quitting, ask your doctor.  Try to lower your stress level. You can do this by using methods such as yoga or meditation. Talk with your doctor if you need help. General instructions   Watch your  dizziness for any changes.  Take over-the-counter and prescription medicines only as told by your doctor. Talk with your doctor if you think that you are dizzy because of a medicine that you are taking.  Tell a friend or a family member that you are feeling dizzy. If he or she notices any changes in your behavior, have this person call your doctor.  Keep all follow-up visits as told by your doctor. This is important. Contact a doctor if:  Your dizziness does not go away.  Your dizziness or light-headedness gets worse.  You feel sick to your stomach (nauseous).  You have trouble hearing.  You have new symptoms.  You are unsteady on your feet.  You feel like the room is spinning. Get help right away if:  You throw up (vomit) or have watery poop (diarrhea), and you cannot eat or drink anything.  You have trouble:  Talking.  Walking.  Swallowing.  Using your arms, hands, or legs.  You feel generally weak.  You are not thinking clearly, or you have trouble forming sentences. A friend or family member may notice this.  You have:  Chest pain.  Pain in your belly (abdomen).  Shortness of breath.  Sweating.  Your vision changes.  You are bleeding.  You have a very bad headache.  You have neck pain or a stiff neck.  You have a fever. These symptoms may be an emergency. Do not wait to see if the symptoms will go away. Get medical help right away. Call your local emergency services (911 in the [place] Do not drive yourself to the hospital. Summary  Dizziness makes you feel unsteady or light-headed. You may feel like you are about to pass out (faint).  Drink enough fluid to keep your pee (urine) clear or pale yellow. Do not drink alcohol.  Avoid making quick movements if you feel dizzy.  Watch your dizziness for any changes. This information is not intended to replace advice given to you by your health care provider. Make sure you discuss any questions you have  with your health care provider. Document Released: 12/30/2010 Document Revised: 01/28/2016 Document Reviewed: 01/28/2016 Elsevier Interactive Patient Education  2017 [employer].

## 2016-06-12 ENCOUNTER — Inpatient Hospital Stay (HOSPITAL_COMMUNITY)
Admission: EM | Admit: 2016-06-12 | Discharge: 2016-06-14 | DRG: 243 | Disposition: A | Discharge status: Home / Self Care | Payer: Medicare Other | Attending: Cardiovascular Disease | Admitting: Cardiovascular Disease

## 2016-06-12 ENCOUNTER — Encounter (HOSPITAL_COMMUNITY): Payer: Self-pay | Admitting: Emergency Medicine

## 2016-06-12 DIAGNOSIS — R42 Dizziness and giddiness: Secondary | ICD-10-CM | POA: Diagnosis not present

## 2016-06-12 DIAGNOSIS — Z6834 Body mass index (BMI) 34.0-34.9, adult: Secondary | ICD-10-CM

## 2016-06-12 DIAGNOSIS — R441 Visual hallucinations: Secondary | ICD-10-CM

## 2016-06-12 DIAGNOSIS — E890 Postprocedural hypothyroidism: Secondary | ICD-10-CM | POA: Diagnosis present

## 2016-06-12 DIAGNOSIS — Z952 Presence of prosthetic heart valve: Secondary | ICD-10-CM | POA: Diagnosis not present

## 2016-06-12 DIAGNOSIS — Z7982 Long term (current) use of aspirin: Secondary | ICD-10-CM | POA: Diagnosis not present

## 2016-06-12 DIAGNOSIS — Z953 Presence of xenogenic heart valve: Secondary | ICD-10-CM | POA: Diagnosis not present

## 2016-06-12 DIAGNOSIS — I444 Left anterior fascicular block: Secondary | ICD-10-CM | POA: Diagnosis present

## 2016-06-12 DIAGNOSIS — E785 Hyperlipidemia, unspecified: Secondary | ICD-10-CM | POA: Diagnosis present

## 2016-06-12 DIAGNOSIS — E1165 Type 2 diabetes mellitus with hyperglycemia: Secondary | ICD-10-CM | POA: Diagnosis not present

## 2016-06-12 DIAGNOSIS — I447 Left bundle-branch block, unspecified: Secondary | ICD-10-CM | POA: Diagnosis present

## 2016-06-12 DIAGNOSIS — Z87891 Personal history of nicotine dependence: Secondary | ICD-10-CM

## 2016-06-12 DIAGNOSIS — Z794 Long term (current) use of insulin: Secondary | ICD-10-CM | POA: Diagnosis not present

## 2016-06-12 DIAGNOSIS — Z954 Presence of other heart-valve replacement: Secondary | ICD-10-CM

## 2016-06-12 DIAGNOSIS — R300 Dysuria: Secondary | ICD-10-CM | POA: Diagnosis not present

## 2016-06-12 DIAGNOSIS — E119 Type 2 diabetes mellitus without complications: Secondary | ICD-10-CM | POA: Diagnosis present

## 2016-06-12 DIAGNOSIS — E7800 Pure hypercholesterolemia, unspecified: Secondary | ICD-10-CM

## 2016-06-12 DIAGNOSIS — R001 Bradycardia, unspecified: Secondary | ICD-10-CM | POA: Diagnosis not present

## 2016-06-12 DIAGNOSIS — E669 Obesity, unspecified: Secondary | ICD-10-CM | POA: Diagnosis not present

## 2016-06-12 DIAGNOSIS — E1122 Type 2 diabetes mellitus with diabetic chronic kidney disease: Secondary | ICD-10-CM | POA: Diagnosis not present

## 2016-06-12 DIAGNOSIS — N400 Enlarged prostate without lower urinary tract symptoms: Secondary | ICD-10-CM | POA: Diagnosis not present

## 2016-06-12 DIAGNOSIS — R079 Chest pain, unspecified: Secondary | ICD-10-CM | POA: Diagnosis not present

## 2016-06-12 DIAGNOSIS — I509 Heart failure, unspecified: Secondary | ICD-10-CM | POA: Diagnosis not present

## 2016-06-12 DIAGNOSIS — I213 ST elevation (STEMI) myocardial infarction of unspecified site: Secondary | ICD-10-CM | POA: Diagnosis not present

## 2016-06-12 DIAGNOSIS — I495 Sick sinus syndrome: Secondary | ICD-10-CM | POA: Diagnosis not present

## 2016-06-12 DIAGNOSIS — Z87892 Personal history of anaphylaxis: Secondary | ICD-10-CM | POA: Diagnosis not present

## 2016-06-12 DIAGNOSIS — I442 Atrioventricular block, complete: Secondary | ICD-10-CM | POA: Diagnosis present

## 2016-06-12 DIAGNOSIS — Z792 Long term (current) use of antibiotics: Secondary | ICD-10-CM

## 2016-06-12 DIAGNOSIS — J449 Chronic obstructive pulmonary disease, unspecified: Secondary | ICD-10-CM | POA: Diagnosis not present

## 2016-06-12 DIAGNOSIS — Z79899 Other long term (current) drug therapy: Secondary | ICD-10-CM | POA: Diagnosis not present

## 2016-06-12 DIAGNOSIS — Z950 Presence of cardiac pacemaker: Secondary | ICD-10-CM | POA: Diagnosis not present

## 2016-06-12 DIAGNOSIS — I10 Essential (primary) hypertension: Secondary | ICD-10-CM | POA: Diagnosis not present

## 2016-06-12 DIAGNOSIS — I4949 Other premature depolarization: Secondary | ICD-10-CM

## 2016-06-12 DIAGNOSIS — R404 Transient alteration of awareness: Secondary | ICD-10-CM | POA: Diagnosis not present

## 2016-06-12 DIAGNOSIS — I130 Hypertensive heart and chronic kidney disease with heart failure and stage 1 through stage 4 chronic kidney disease, or unspecified chronic kidney disease: Secondary | ICD-10-CM | POA: Diagnosis not present

## 2016-06-12 DIAGNOSIS — I499 Cardiac arrhythmia, unspecified: Secondary | ICD-10-CM | POA: Diagnosis not present

## 2016-06-12 DIAGNOSIS — Z888 Allergy status to other drugs, medicaments and biological substances status: Secondary | ICD-10-CM | POA: Diagnosis not present

## 2016-06-12 DIAGNOSIS — J208 Acute bronchitis due to other specified organisms: Secondary | ICD-10-CM

## 2016-06-12 DIAGNOSIS — N189 Chronic kidney disease, unspecified: Secondary | ICD-10-CM | POA: Diagnosis not present

## 2016-06-12 LAB — CBC WITH DIFFERENTIAL/PLATELET
Basophils Absolute: 0 10*3/uL (ref 0.0–0.1)
Basophils Relative: 0 %
EOS ABS: 0.2 10*3/uL (ref 0.0–0.7)
Eosinophils Relative: 2 %
HEMATOCRIT: 38.2 % — AB (ref 39.0–52.0)
HEMOGLOBIN: 12.7 g/dL — AB (ref 13.0–17.0)
LYMPHS ABS: 2.1 10*3/uL (ref 0.7–4.0)
Lymphocytes Relative: 24 %
MCH: 31.1 pg (ref 26.0–34.0)
MCHC: 33.2 g/dL (ref 30.0–36.0)
MCV: 93.6 fL (ref 78.0–100.0)
MONOS PCT: 10 %
Monocytes Absolute: 0.9 10*3/uL (ref 0.1–1.0)
NEUTROS ABS: 5.7 10*3/uL (ref 1.7–7.7)
Neutrophils Relative %: 64 %
Platelets: 152 10*3/uL (ref 150–400)
RBC: 4.08 MIL/uL — AB (ref 4.22–5.81)
RDW: 15.7 % — ABNORMAL HIGH (ref 11.5–15.5)
WBC: 9 10*3/uL (ref 4.0–10.5)

## 2016-06-12 LAB — COMPREHENSIVE METABOLIC PANEL
ALBUMIN: 3.4 g/dL — AB (ref 3.5–5.0)
ALK PHOS: 57 U/L (ref 38–126)
ALT: 18 U/L (ref 17–63)
ANION GAP: 10 (ref 5–15)
AST: 22 U/L (ref 15–41)
BILIRUBIN TOTAL: 0.6 mg/dL (ref 0.3–1.2)
BUN: 25 mg/dL — ABNORMAL HIGH (ref 6–20)
CALCIUM: 8.9 mg/dL (ref 8.9–10.3)
CO2: 23 mmol/L (ref 22–32)
Chloride: 102 mmol/L (ref 101–111)
Creatinine, Ser: 1.37 mg/dL — ABNORMAL HIGH (ref 0.61–1.24)
GFR calc Af Amer: 58 mL/min — ABNORMAL LOW (ref >60)
GFR calc non Af Amer: 50 mL/min — ABNORMAL LOW (ref >60)
GLUCOSE: 249 mg/dL — AB (ref 65–99)
Potassium: 4.3 mmol/L (ref 3.5–5.1)
SODIUM: 135 mmol/L (ref 135–145)
TOTAL PROTEIN: 6.5 g/dL (ref 6.5–8.1)

## 2016-06-12 LAB — CREATININE, SERUM
CREATININE: 1.37 mg/dL — AB (ref 0.61–1.24)
GFR calc Af Amer: 58 mL/min — ABNORMAL LOW (ref >60)
GFR calc non Af Amer: 50 mL/min — ABNORMAL LOW (ref >60)

## 2016-06-12 LAB — CBC
HEMATOCRIT: 36.1 % — AB (ref 39.0–52.0)
Hemoglobin: 12 g/dL — ABNORMAL LOW (ref 13.0–17.0)
MCH: 31.3 pg (ref 26.0–34.0)
MCHC: 33.2 g/dL (ref 30.0–36.0)
MCV: 94 fL (ref 78.0–100.0)
Platelets: 138 10*3/uL — ABNORMAL LOW (ref 150–400)
RBC: 3.84 MIL/uL — AB (ref 4.22–5.81)
RDW: 15.8 % — AB (ref 11.5–15.5)
WBC: 7.9 10*3/uL (ref 4.0–10.5)

## 2016-06-12 LAB — LIPID PANEL
Cholesterol: 118 mg/dL (ref 0–200)
HDL: 39 mg/dL — AB (ref >40)
LDL CALC: 51 mg/dL (ref 0–99)
TRIGLYCERIDES: 140 mg/dL (ref <150)
Total CHOL/HDL Ratio: 3 RATIO
VLDL: 28 mg/dL (ref 0–40)

## 2016-06-12 LAB — TSH: TSH: 2.132 u[IU]/mL (ref 0.350–4.500)

## 2016-06-12 LAB — GLUCOSE, CAPILLARY
GLUCOSE-CAPILLARY: 211 mg/dL — AB (ref 65–99)
GLUCOSE-CAPILLARY: 251 mg/dL — AB (ref 65–99)

## 2016-06-12 LAB — TROPONIN I: Troponin I: <0.03 ng/mL (ref <0.03)

## 2016-06-12 LAB — APTT: aPTT: 144 seconds — ABNORMAL HIGH (ref 24–36)

## 2016-06-12 LAB — PROTIME-INR
INR: 1.2
PROTHROMBIN TIME: 15.3 s — AB (ref 11.4–15.2)

## 2016-06-12 LAB — MRSA PCR SCREENING: MRSA by PCR: NEGATIVE

## 2016-06-12 MED ORDER — ACETAMINOPHEN 500 MG PO TABS: 1000.0000 mg | ORAL_TABLET | Freq: Four times a day (QID) | ORAL | Status: DC | PRN

## 2016-06-12 MED ORDER — INSULIN ASPART 100 UNIT/ML ~~LOC~~ SOLN
0.0000 [IU] | Freq: Three times a day (TID) | SUBCUTANEOUS | Status: DC
  Administered 2016-06-13: 5 [IU] via SUBCUTANEOUS
  Administered 2016-06-14: 2 [IU] via SUBCUTANEOUS

## 2016-06-12 MED ORDER — SODIUM CHLORIDE 0.9 % IV SOLN: INTRAVENOUS | Status: DC

## 2016-06-12 MED ORDER — SODIUM CHLORIDE 0.9 % IV SOLN
INTRAVENOUS | Status: DC
  Administered 2016-06-12: 19:00:00 via INTRAVENOUS

## 2016-06-12 MED ORDER — LIRAGLUTIDE 18 MG/3ML ~~LOC~~ SOPN: 1.8000 mg | PEN_INJECTOR | Freq: Every day | SUBCUTANEOUS | Status: DC

## 2016-06-12 MED ORDER — HEPARIN SODIUM (PORCINE) 5000 UNIT/ML IJ SOLN
5000.0000 [IU] | Freq: Three times a day (TID) | INTRAMUSCULAR | Status: DC
  Administered 2016-06-12: 5000 [IU] via SUBCUTANEOUS
  Filled 2016-06-12: qty 1

## 2016-06-12 MED ORDER — METFORMIN HCL 500 MG PO TABS
1000.0000 mg | ORAL_TABLET | Freq: Two times a day (BID) | ORAL | Status: DC
  Administered 2016-06-12 – 2016-06-14 (×2): 1000 mg via ORAL
  Filled 2016-06-12 (×2): qty 2

## 2016-06-12 MED ORDER — SODIUM CHLORIDE 0.9 % IR SOLN
80.0000 mg | Status: AC
  Administered 2016-06-13: 80 mg
  Filled 2016-06-12: qty 2

## 2016-06-12 MED ORDER — CHLORHEXIDINE GLUCONATE 4 % EX LIQD
60.0000 mL | Freq: Once | CUTANEOUS | Status: AC
  Administered 2016-06-13: 4 via TOPICAL
  Filled 2016-06-12: qty 60

## 2016-06-12 MED ORDER — CITALOPRAM HYDROBROMIDE 20 MG PO TABS
20.0000 mg | ORAL_TABLET | Freq: Every day | ORAL | Status: DC
  Administered 2016-06-13 – 2016-06-14 (×2): 20 mg via ORAL
  Filled 2016-06-12 (×2): qty 1

## 2016-06-12 MED ORDER — ASPIRIN 81 MG PO CHEW
81.0000 mg | CHEWABLE_TABLET | Freq: Every day | ORAL | Status: DC
  Administered 2016-06-13: 81 mg via ORAL
  Filled 2016-06-12 (×2): qty 1

## 2016-06-12 MED ORDER — INSULIN DETEMIR 100 UNIT/ML ~~LOC~~ SOLN
48.0000 [IU] | Freq: Two times a day (BID) | SUBCUTANEOUS | Status: DC
  Administered 2016-06-12 – 2016-06-14 (×4): 48 [IU] via SUBCUTANEOUS
  Filled 2016-06-12 (×5): qty 0.48

## 2016-06-12 MED ORDER — LEVOTHYROXINE SODIUM 100 MCG PO TABS
200.0000 ug | ORAL_TABLET | Freq: Every day | ORAL | Status: DC
  Administered 2016-06-13 – 2016-06-14 (×2): 200 ug via ORAL
  Filled 2016-06-12 (×2): qty 2

## 2016-06-12 MED ORDER — ATORVASTATIN CALCIUM 40 MG PO TABS
40.0000 mg | ORAL_TABLET | Freq: Every day | ORAL | Status: DC
  Administered 2016-06-12 – 2016-06-13 (×2): 40 mg via ORAL
  Filled 2016-06-12 (×3): qty 1

## 2016-06-12 MED ORDER — METFORMIN HCL 500 MG PO TABS: 1000.0000 mg | ORAL_TABLET | Freq: Two times a day (BID) | ORAL | Status: DC

## 2016-06-12 MED ORDER — TAMSULOSIN HCL 0.4 MG PO CAPS
0.4000 mg | ORAL_CAPSULE | Freq: Every day | ORAL | Status: DC
  Administered 2016-06-12 – 2016-06-13 (×2): 0.4 mg via ORAL
  Filled 2016-06-12 (×2): qty 1

## 2016-06-12 MED ORDER — CYANOCOBALAMIN 1000 MCG/ML IJ SOLN: 1000.0000 ug | INTRAMUSCULAR | Status: DC

## 2016-06-12 MED ORDER — DEXTROSE 5 % IV SOLN
3.0000 g | INTRAVENOUS | Status: AC
  Administered 2016-06-13: 3 g via INTRAVENOUS
  Filled 2016-06-12 (×2): qty 3000

## 2016-06-12 MED ORDER — SODIUM CHLORIDE 0.9% FLUSH
3.0000 mL | Freq: Two times a day (BID) | INTRAVENOUS | Status: DC
  Administered 2016-06-12 – 2016-06-13 (×2): 3 mL via INTRAVENOUS

## 2016-06-12 MED ORDER — INSULIN ASPART 100 UNIT/ML ~~LOC~~ SOLN
0.0000 [IU] | Freq: Every day | SUBCUTANEOUS | Status: DC
  Administered 2016-06-12 – 2016-06-13 (×2): 3 [IU] via SUBCUTANEOUS

## 2016-06-12 MED ORDER — CHLORHEXIDINE GLUCONATE 4 % EX LIQD
60.0000 mL | Freq: Once | CUTANEOUS | Status: AC
  Administered 2016-06-12: 4 via TOPICAL
  Filled 2016-06-12: qty 60

## 2016-06-12 MED ORDER — FUROSEMIDE 20 MG PO TABS
20.0000 mg | ORAL_TABLET | Freq: Every day | ORAL | Status: DC
  Administered 2016-06-13: 20 mg via ORAL
  Filled 2016-06-12 (×2): qty 1

## 2016-06-12 NOTE — ED Notes (Signed)
Pt eating lunch tray  

## 2016-06-12 NOTE — H&P (Signed)
Cardiology history and physical:   Patient ID: [name]; [id number]; February 09, 1945   Admit date: 06/12/2016 Date of Consult: 06/12/2016  Primary Care Provider: Timmothy Euler, MD Primary Cardiologist: Hochrein Primary Electrophysiologist:  new   Patient Profile:   [name] is a 71 y.o. male with a hx of Aortic valve replacement with bioprosthesis who is being seen today for the evaluation of bradycardia and new left bundle branch block.  History of Present Illness:   [name] presented to his primary care provider's office with complaints of weakness and dizziness. His symptoms were thought to be related to an antibiotic that he was taken, but when the symptoms persisted he went to the emergency room. At [facility] he was found to have a new left bundle branch block raising the concern for an acute myocardial infarction. He was transferred as a possible STEMI. He never had chest pain. He continues to deny any chest pain and appears to be quite comfortable as long as he is lying down. He denies dyspnea and has not had overt syncope. He is occasionally aware of palpitations. Denies focal neurological complaints. Has not had any edema or intermittent claudication. No other intercurrent illness.  His beta blocker was discontinued roughly a year ago due to bradycardia. A 48-hour Holter monitor in July 2017 did not show severe bradycardia or AV block or atrial fibrillation. Echocardiogram in May 2017 showed normal left ventricular systolic function, but there was LVH and grade II diastolic dysfunction. The aortic valve bioprosthesis had moderately elevated gradients: Peak 45, mean 21 mms Hg. These gradients were increased from the year before. The left atrium was severely dilated. Nuclear stress test in September 2017 was a low risk normal study, EF 59%.  He underwent aortic valve replacement with a stentless [place] valve in 2006. He has treated hypothyroidism and  reports good compliance with his levothyroxine supplement. TSH was borderline high at 5.2 in December 2017. He is well treated hyperlipidemia and hypertension.  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 He is not taking any medications with negative inotropic effect.  Past Medical History:  Diagnosis Date  . Arthritis   . BPH (benign prostatic hypertrophy) 04/15/2010  . Colon polyps   . DEPRESSION 01/22/2009  . Heart valve replaced   . HYPERCHOLESTEROLEMIA 01/22/2009  . HYPERTENSION 01/22/2009   Dr. [name]  . 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 Morbid obesity ([place])   . Tremors of nervous system    ?ptsd  . Ulcer     Past Surgical History:  Procedure Laterality Date  . 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 APPENDECTOMY    . bilateral cataract surg    . COLONOSCOPY  04/24/2007   [name]: normal  . COLONOSCOPY WITH ESOPHAGOGASTRODUODENOSCOPY (EGD) N/A 04/05/2012   Procedure: COLONOSCOPY WITH ESOPHAGOGASTRODUODENOSCOPY (EGD);  Surgeon: Daneil Dolin, MD;  Location: [facility] ENDO SUITE;  Service: Endoscopy;  Laterality: N/A;  10;15  . DENTAL SURGERY  05/2004   Dental extractions  . HEEL SPUR SURGERY Bilateral    resection of heel spur  . KNEE ARTHROSCOPY WITH LATERAL MENISECTOMY Right 04/04/2014   Procedure: KNEE ARTHROSCOPY WITH LATERAL MENISECTOMY;  Surgeon: Carole Civil, MD;  Location: [facility] ORS;  Service: Orthopedics;  Laterality: Right;  . THYROIDECTOMY  03/21/2013   DR [name]  . THYROIDECTOMY N/A 03/21/2013   Procedure: THYROIDECTOMY;  Surgeon: [name], MD;  Location: [facility] OR;  Service: General;  Laterality: N/A;  . TONSILLECTOMY       Inpatient Medications: Scheduled Meds:  Continuous Infusions:  PRN Meds:   Allergies:    Allergies  Allergen Reactions  . Phenothiazines Anaphylaxis  . Actos [Pioglitazone] Swelling    Social History:   Social History   Social  History  . Marital status: Single    Spouse name: N/A  . Number of children: 1  . Years of education: HS   Occupational History  . Truck Geophysicist/field seismologist, retired Retired   Social History Main Topics  . Smoking status: Former Smoker    Packs/day: 0.00    Years: 12.00    Types: Cigarettes    Quit date: 09/20/1961  . Smokeless tobacco: Never Used  . Alcohol use No  . Drug use: No  . Sexual activity: No     Comment: Has not smoked in 20 years   Other Topics Concern  . Not on file   Social History Narrative   Lives alone.   Quit smoking approximately 28 years ago.   Long haul truck driver, lives in [place].    Family History:   The patient's family history is not on file.  ROS:  Please see the history of present illness.  All other ROS reviewed and negative.     Physical Exam/Data:   Vitals:   06/12/16 1130 06/12/16 1134  BP:  (!) 161/57  Pulse:  (!) 41  Resp:  16  Temp:  98.4 F (36.9 C)  SpO2:  99%  Weight: 275 lb (124.7 kg)   Height: 6\' 1"  (1.854 m)    No intake or output data in the 24 hours ending 06/12/16 1157 Filed Weights   06/12/16 1130  Weight: 275 lb (124.7 kg)   Body mass index is 36.28 kg/m.  General:  Well nourished, well developed, in no acute distress. He is in the disposition and joking a lot. Obese. HEENT: normal Lymph: no adenopathy Neck: no JVD Endocrine:  No thryomegaly Vascular: No carotid bruits; FA pulses 2+ bilaterally without bruits  Cardiac:  normal S1, S2; RRR; no murmur  Lungs:  clear to auscultation bilaterally, no wheezing, rhonchi or rales  Abd: soft, nontender, no hepatomegaly  Ext: no edema Musculoskeletal:  No deformities, BUE and BLE strength normal and equal Skin: warm and dry  Neuro:  CNs 2-12 intact, no focal abnormalities noted Psych:  Normal affect    EKG:  The EKG was personally reviewed and demonstrates severe sinus bradycardia at 40 bpm, junctional escape rhythm with occasional sinus beats. Left bundle branch  block  Relevant CV Studies: Reviewed 48 hour Holter 09-01-2015 No evidence of atrial fib.  PACs.  Junctional rhythm.  No sustained bradyarrhythmias.  No symptoms reported.    Nuclear stress test 10/07/2015  There was no ST segment deviation noted during stress.  The study is normal.  This is a low risk study.  Nuclear stress EF: 59%.  Echo May 25, 2015 - Left ventricle: The cavity size was normal. There was mild   concentric hypertrophy. Systolic function was normal. The   estimated ejection fraction was in the range of 55% to 60%.   Although no diagnostic regional wall motion abnormality was   identified, this possibility cannot be completely excluded on the   basis of this study. Features are consistent with a pseudonormal   left ventricular filling pattern, with concomitant abnormal   relaxation and increased filling pressure (grade 2 diastolic  dysfunction). - Aortic valve: A bioprosthesis was present. Transvalvular velocity   was increased more than expected. There was mild to moderate   stenosis. - Mitral valve: There was mild regurgitation directed centrally. - Left atrium: The atrium was severely dilated.  Laboratory Data:  ChemistryNo results for input(s): NA, K, CL, CO2, GLUCOSE, BUN, CREATININE, CALCIUM, GFRNONAA, GFRAA, ANIONGAP in the last 168 hours.  No results for input(s): PROT, ALBUMIN, AST, ALT, ALKPHOS, BILITOT in the last 168 hours. Hematology Recent Labs Lab 06/12/16 1134  WBC 9.0  RBC 4.08*  HGB 12.7*  HCT 38.2*  MCV 93.6  MCH 31.1  MCHC 33.2  RDW 15.7*  PLT 152   Cardiac EnzymesNo results for input(s): TROPONINI in the last 168 hours. No results for input(s): TROPIPOC in the last 168 hours.  BNPNo results for input(s): BNP, PROBNP in the last 168 hours.  DDimer No results for input(s): DDIMER in the last 168 hours.  Radiology/Studies:  No results found.  Assessment and Plan:   1. Sinus node dysfunction - he has symptomatic  bradycardia and junctional escape rhythm. He has also developed a new left bundle branch block. He is not on any negative chronotropic agents. He will probably need pacemaker implantation. Recheck thyroid function, but he does not have any clinical features to suggest myxedema. Request EP consultation. Discussed pacemaker implantation with him in detail and tentatively scheduled for tomorrow. 2.S/P tissue AVR - [place] 20 mm valve in 2006, with increased gradients by echo in 2017. On physical exam the murmur is not impressive and is definitely early peaking. Repeat ECHO. They should have little impact on the decision to implant a pacemaker. 3. Hypothyroidism - he is on a high dose of levothyroxine and reports compliance with the medication. TSH was borderline elevated in December. He does not have clinical manifestations of hypothyroidism. 4. HTN - mildly elevated systolic blood pressure with increased pulse pressure, likely due to bradycardia. Avoid beta blockers diltiazem verapamil clonidine and other negative chronotropic agents 5. HLP - last lipid profile was good.   Signed, Sanda Klein, MD  06/12/2016 11:57 AM

## 2016-06-12 NOTE — ED Triage Notes (Signed)
Per [place], pt c/o UTI, slow HR, pt EKG shows right BBB, code stemi called. Pt denies CP. Pt has received 5000 heparin and 324 asa. Pt from [facility].

## 2016-06-12 NOTE — ED Provider Notes (Signed)
[facility] DEPT Provider Note   CSN: [id number] Arrival date & time: 06/12/16  1126     History   Chief Complaint Chief Complaint  Patient presents with  . Bradycardia    HPI [name] is a 71 y.o. male.  Pt presents as a CODE STEMI, another pt in cath lab.  Was transferred from [facility] ED.  PT states that he has had dizziness/lightheadedness for the last 3 days.  Denies any CP or SOB.  No n/v or diaphoresis.  No leg swelling.  No abd pain.  Noted HR to be low.  States that his HR has been low in the past and was taken off his metoprolol last year.        Past Medical History:  Diagnosis Date  . Arthritis   . BPH (benign prostatic hypertrophy) 04/15/2010  . Colon polyps   . DEPRESSION 01/22/2009  . Heart valve replaced   . HYPERCHOLESTEROLEMIA 01/22/2009  . HYPERTENSION 01/22/2009   Dr. [name]  . 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 Morbid obesity ([place])   . Tremors of nervous system    ?ptsd  . Ulcer     Patient Active Problem List   Diagnosis Date Noted  . Symptomatic bradycardia 06/12/2016  . Groin pain   . Hypothyroidism 03/25/2015  . Diastolic CHF ([place]) 61/44/3154  . B12 deficiency 04/08/2014  . Lateral meniscal tear   . DM type 2, not at goal [facility]) 03/12/2014  . 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 GERD (gastroesophageal reflux disease) 11/02/2012  . Anemia 04/02/2012  . Obesity 08/04/2010  . Benign prostatic hyperplasia 04/15/2010  . CAROTID OCCLUSIVE DISEASE 01/26/2010  . MURMUR 05/27/2009  . Aortic valve disease 05/27/2009  . Hyperlipidemia with target LDL less than 100 01/22/2009  . Depression 01/22/2009  . Essential hypertension 01/22/2009    Past Surgical History:  Procedure Laterality Date  . 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 APPENDECTOMY    . bilateral cataract surg    . COLONOSCOPY  04/24/2007   [name]: normal  . COLONOSCOPY WITH  ESOPHAGOGASTRODUODENOSCOPY (EGD) N/A 04/05/2012   Procedure: COLONOSCOPY WITH ESOPHAGOGASTRODUODENOSCOPY (EGD);  Surgeon: Daneil Dolin, MD;  Location: [facility] ENDO SUITE;  Service: Endoscopy;  Laterality: N/A;  10;15  . DENTAL SURGERY  05/2004   Dental extractions  . HEEL SPUR SURGERY Bilateral    resection of heel spur  . KNEE ARTHROSCOPY WITH LATERAL MENISECTOMY Right 04/04/2014   Procedure: KNEE ARTHROSCOPY WITH LATERAL MENISECTOMY;  Surgeon: Carole Civil, MD;  Location: [facility] ORS;  Service: Orthopedics;  Laterality: Right;  . THYROIDECTOMY  03/21/2013   DR [name]  . THYROIDECTOMY N/A 03/21/2013   Procedure: THYROIDECTOMY;  Surgeon: [name], MD;  Location: [facility];  Service: General;  Laterality: N/A;  . TONSILLECTOMY         Home Medications    Prior to Admission medications   Medication Sig Start Date End Date Taking? Authorizing Provider  acetaminophen (TYLENOL) 500 MG tablet Take 1,000 mg by mouth every 6 (six) hours as needed (pain).    [provider]  aspirin 325 MG tablet Take 325 mg by mouth daily.    [provider]  ciprofloxacin (CIPRO) 500 MG tablet Take 1 tablet (500 mg total) by mouth 2 (two) times daily. 06/03/16   Hassell Done, Mary-Margaret, FNP  citalopram (CELEXA) 20 MG tablet Take 1 tablet (20 mg total) by mouth daily. 01/03/15   Randell Patient,  Tobie Lords, MD  furosemide (LASIX) 20 MG tablet Take 20 mg by mouth daily.    [provider]  glucose blood test strip 1 strip by Does not apply route 3 (three) times daily. Uses true metrix strips (diabetic club) 05/12/16   [provider]  insulin detemir (LEVEMIR) 100 UNIT/ML injection Inject 0.48 mLs (48 Units total) into the skin 2 (two) times daily. 05/18/16   Cherre Robins, PharmD  Insulin Pen Needle (B-D ULTRAFINE III SHORT PEN) 31G X 8 MM MISC Use to inject Victoza qd 11/20/15   Timmothy Euler, MD  levothyroxine (SYNTHROID, LEVOTHROID) 200 MCG tablet Take 1 tablet (200 mcg total) by mouth  daily before breakfast. 02/02/16   Claretta Fraise, MD  liraglutide 18 MG/3ML SOPN Inject 0.3 mLs (1.8 mg total) into the skin daily. 05/09/16   Cherre Robins, PharmD  metFORMIN (GLUCOPHAGE) 1000 MG tablet TAKE 1 TABLET BY MOUTH TWICE DAILY WITH A MEAL. 07/24/15   Timmothy Euler, MD  Multiple Vitamins-Minerals (MENS MULTI VITAMIN & MINERAL PO) Take 1 tablet by mouth daily.      [provider]  Omega-3 Fatty Acids (FISH OIL) 1200 MG CAPS Take 1,200-2,400 mg by mouth 2 (two) times daily. Take 1 capsule (1200 mg) by mouth daily at noon and 2 capsules (2400 mg) daily at bedtime    [provider]  simvastatin (ZOCOR) 80 MG tablet Take 40 mg by mouth at bedtime.  07/10/14   [provider]  tamsulosin (FLOMAX) 0.4 MG CAPS capsule Take 0.4 mg by mouth at bedtime.    [provider]    Family History Family History  Problem Relation Age of Onset  . Diabetes Unknown     Social History Social History  Substance Use Topics  . Smoking status: Former Smoker    Packs/day: 0.00    Years: 12.00    Types: Cigarettes    Quit date: 09/20/1961  . Smokeless tobacco: Never Used  . Alcohol use No     Allergies   Phenothiazines and Actos [pioglitazone]   Review of Systems Review of Systems  Constitutional: Negative for chills, diaphoresis, fatigue and fever.  HENT: Negative for congestion, rhinorrhea and sneezing.   Eyes: Negative.   Respiratory: Negative for cough, chest tightness and shortness of breath.   Cardiovascular: Negative for chest pain and leg swelling.  Gastrointestinal: Negative for abdominal pain, blood in stool, diarrhea, nausea and vomiting.  Genitourinary: Negative for difficulty urinating, flank pain, frequency and hematuria.  Musculoskeletal: Negative for arthralgias and back pain.  Skin: Negative for rash.  Neurological: Positive for weakness and light-headedness. Negative for dizziness, speech difficulty, numbness and headaches.      Physical Exam Updated Vital Signs BP (!) 152/58   Pulse (!) 40   Temp 98.4 F (36.9 C)   Resp 11   Ht 6\' 1"  (1.854 m)   Wt 275 lb (124.7 kg)   SpO2 97%   BMI 36.28 kg/m   Physical Exam  Constitutional: He is oriented to person, place, and time. He appears well-developed and well-nourished.  HENT:  Head: Normocephalic and atraumatic.  Eyes: Pupils are equal, round, and reactive to light.  Neck: Normal range of motion. Neck supple.  Cardiovascular: Regular rhythm and normal heart sounds.  Bradycardia present.   Pulmonary/Chest: Effort normal and breath sounds normal. No respiratory distress. He has no wheezes. He has no rales. He exhibits no tenderness.  Abdominal: Soft. Bowel sounds are normal. There is no tenderness. There is  no rebound and no guarding.  Musculoskeletal: Normal range of motion. He exhibits no edema.  Lymphadenopathy:    He has no cervical adenopathy.  Neurological: He is alert and oriented to person, place, and time.  Skin: Skin is warm and dry. No rash noted.  Psychiatric: He has a normal mood and affect.     ED Treatments / Results  Labs (all labs ordered are listed, but only abnormal results are displayed) Labs Reviewed  CBC WITH DIFFERENTIAL/PLATELET - Abnormal; Notable for the following:       Result Value   RBC 4.08 (*)    Hemoglobin 12.7 (*)    HCT 38.2 (*)    RDW 15.7 (*)    All other components within normal limits  COMPREHENSIVE METABOLIC PANEL - Abnormal; Notable for the following:    Glucose, Bld 249 (*)    BUN 25 (*)    Creatinine, Ser 1.37 (*)    Albumin 3.4 (*)    GFR calc non Af Amer 50 (*)    GFR calc Af Amer 58 (*)    All other components within normal limits  TROPONIN I  PROTIME-INR  APTT  LIPID PANEL  TSH    EKG  EKG Interpretation  Date/Time:  Sunday Jun 12 2016 11:32:10 EDT Ventricular Rate:  41 PR Interval:    QRS Duration: 159 QT Interval:  585 QTC Calculation: 484 R Axis:   -4 Text Interpretation:   Junctional rhythm Left bundle branch block Baseline wander in lead(s) V2 V3 LBBB new from prior, HR slowed Confirmed by Ariaunna Longsworth  MD, [facility] ([id number]) on 06/12/2016 11:39:43 AM       Radiology No results found.  Procedures Procedures (including critical care time)  Medications Ordered in ED Medications - No data to display   Initial Impression / Assessment and Plan / ED Course  I have reviewed the triage vital signs and the nursing notes.  Pertinent labs & imaging results that were available during my care of the patient were reviewed by me and considered in my medical decision making (see chart for details).     Patient presents as a code STEMI. However he was delayed in the ED and when EKGs were reviewed by cardiology, he did not meet criteria for STEMI activation. Code STEMI was canceled. He is however and a junctional escape rhythm with a left bundle branch block. He is maintaining his blood pressure. Cardiology will admit the patient for further treatment and likely pacemaker placement.  Final Clinical Impressions(s) / ED Diagnoses   Final diagnoses:  Symptomatic bradycardia  Junctional escape rhythm    New Prescriptions New Prescriptions   No medications on file     Malvin Johns, MD 06/12/16 1228

## 2016-06-12 NOTE — ED Notes (Signed)
Attempted report 

## 2016-06-12 NOTE — Progress Notes (Addendum)
ED Bridge secretary Candace alerted chaplain to incoming STEMI from [facility], 30-40 minutes out. Chaplain checked back with ED bridge and encountered patient arriving.  Patient was jovial and chatty and making jokes.  Chaplain asked if any family members would be arriving and patient stated, "no".  Chaplain alerted patient to available support. Patient replied by asking if chaplain was married.  Chaplain deflected question and told patient that support was available to him at any time.    Please contact if patient needs or requests support.   Luana Shu Temporary: [phone] (after 8:30 PM today check [email or web address]    06/12/16 1100  Clinical Encounter Type  Visited With Patient  Visit Type Initial  Referral From (STEMI)  Consult/Referral To Chaplain  Stress Factors  Patient Stress Factors Lack of caregivers   Possible: loneliness?

## 2016-06-13 ENCOUNTER — Encounter (HOSPITAL_COMMUNITY)
Admission: EM | Disposition: A | Discharge status: Home / Self Care | Payer: Self-pay | Source: Home / Self Care | Attending: Cardiovascular Disease

## 2016-06-13 ENCOUNTER — Encounter (HOSPITAL_COMMUNITY): Payer: Self-pay | Admitting: Internal Medicine

## 2016-06-13 DIAGNOSIS — I442 Atrioventricular block, complete: Secondary | ICD-10-CM

## 2016-06-13 DIAGNOSIS — R001 Bradycardia, unspecified: Secondary | ICD-10-CM

## 2016-06-13 HISTORY — PX: PACEMAKER IMPLANT: EP1218

## 2016-06-13 LAB — GLUCOSE, CAPILLARY
GLUCOSE-CAPILLARY: 103 mg/dL — AB (ref 65–99)
GLUCOSE-CAPILLARY: 237 mg/dL — AB (ref 65–99)
GLUCOSE-CAPILLARY: 272 mg/dL — AB (ref 65–99)
Glucose-Capillary: 104 mg/dL — ABNORMAL HIGH (ref 65–99)

## 2016-06-13 LAB — SURGICAL PCR SCREEN
MRSA, PCR: NEGATIVE
Staphylococcus aureus: NEGATIVE

## 2016-06-13 SURGERY — PACEMAKER IMPLANT
Anesthesia: LOCAL

## 2016-06-13 MED ORDER — GENTAMICIN SULFATE 40 MG/ML IJ SOLN
INTRAMUSCULAR | Status: AC
  Filled 2016-06-13: qty 2

## 2016-06-13 MED ORDER — HEPARIN (PORCINE) IN NACL 2-0.9 UNIT/ML-% IJ SOLN
INTRAMUSCULAR | Status: AC | PRN
  Administered 2016-06-13: 500 mL

## 2016-06-13 MED ORDER — LIDOCAINE HCL (PF) 1 % IJ SOLN
INTRAMUSCULAR | Status: AC
  Filled 2016-06-13: qty 30

## 2016-06-13 MED ORDER — FENTANYL CITRATE (PF) 100 MCG/2ML IJ SOLN
INTRAMUSCULAR | Status: AC
Start: 2016-06-13 — End: 2016-06-13
  Filled 2016-06-13: qty 2

## 2016-06-13 MED ORDER — SODIUM CHLORIDE 0.9 % IR SOLN
Status: AC
  Filled 2016-06-13: qty 2

## 2016-06-13 MED ORDER — ONDANSETRON HCL 4 MG/2ML IJ SOLN: 4.0000 mg | Freq: Four times a day (QID) | INTRAMUSCULAR | Status: DC | PRN

## 2016-06-13 MED ORDER — ACETAMINOPHEN 325 MG PO TABS
325.0000 mg | ORAL_TABLET | ORAL | Status: DC | PRN
  Administered 2016-06-13 – 2016-06-14 (×2): 650 mg via ORAL
  Filled 2016-06-13 (×2): qty 2

## 2016-06-13 MED ORDER — MIDAZOLAM HCL 5 MG/5ML IJ SOLN
INTRAMUSCULAR | Status: AC
  Filled 2016-06-13: qty 5

## 2016-06-13 MED ORDER — HEPARIN (PORCINE) IN NACL 2-0.9 UNIT/ML-% IJ SOLN
INTRAMUSCULAR | Status: AC
Start: 2016-06-13 — End: 2016-06-13
  Filled 2016-06-13: qty 500

## 2016-06-13 MED ORDER — FENTANYL CITRATE (PF) 100 MCG/2ML IJ SOLN
INTRAMUSCULAR | Status: DC | PRN
  Administered 2016-06-13 (×2): 12.5 ug via INTRAVENOUS

## 2016-06-13 MED ORDER — MIDAZOLAM HCL 5 MG/5ML IJ SOLN
INTRAMUSCULAR | Status: DC | PRN
  Administered 2016-06-13 (×2): 1 mg via INTRAVENOUS

## 2016-06-13 MED ORDER — LIDOCAINE HCL (PF) 1 % IJ SOLN
INTRAMUSCULAR | Status: DC | PRN
  Administered 2016-06-13: 40 mL

## 2016-06-13 MED ORDER — CEFAZOLIN SODIUM-DEXTROSE 2-4 GM/100ML-% IV SOLN
2.0000 g | Freq: Four times a day (QID) | INTRAVENOUS | Status: AC
  Administered 2016-06-13 – 2016-06-14 (×3): 2 g via INTRAVENOUS
  Filled 2016-06-13 (×3): qty 100

## 2016-06-13 SURGICAL SUPPLY — 13 items
CABLE SURGICAL S-101-97-12 (CABLE) ×1 IMPLANT
CATH RIGHTSITE C315HIS02 (CATHETERS) ×1 IMPLANT
HOVERMATT SINGLE USE (MISCELLANEOUS) ×1 IMPLANT
IPG PACE AZUR XT DR MRI W1DR01 (Pacemaker) IMPLANT
LEAD CAPSURE NOVUS 5076-52CM (Lead) ×1 IMPLANT
LEAD SELECT SECURE 3830 383069 (Lead) IMPLANT
PACE AZURE XT DR MRI W1DR01 (Pacemaker) ×2 IMPLANT
PAD DEFIB LIFELINK (PAD) ×1 IMPLANT
SELECT SECURE 3830 383069 (Lead) ×2 IMPLANT
SHEATH CLASSIC 7F (SHEATH) ×2 IMPLANT
SLITTER 6232ADJ (MISCELLANEOUS) ×1 IMPLANT
TRAY PACEMAKER INSERTION (PACKS) ×1 IMPLANT
WIRE HI TORQ VERSACORE-J 145CM (WIRE) ×1 IMPLANT

## 2016-06-13 NOTE — Discharge Summary (Signed)
ELECTROPHYSIOLOGY PROCEDURE DISCHARGE SUMMARY    Patient ID: [name],  MRN: [id number], DOB/AGE: 1945-10-12 71 y.o.  Admit date: 06/12/2016 Discharge date: 06/14/2016  Primary Care Physician: Timmothy Euler, MD Primary Cardiologist: Hochrein Electrophysiologist: Lovena Le  Primary Discharge Diagnosis:  Symptomatic bradycardia status post pacemaker implantation this admission  Secondary Discharge Diagnosis:  1.  Prior AVR 2.  HTN 3.  Hypothyroidism 4.  Obesity  Allergies  Allergen Reactions  . Phenothiazines Anaphylaxis  . Actos [Pioglitazone] Swelling     Procedures This Admission:  1.  Implantation of a MDT dual chamber PPM on 06/13/16 by Dr Lovena Le.  The patient received a MDT model number Ad[place]a PPM with model number 5732 right atrial lead and 3830 right ventricular lead. There were no immediate post procedure complications. 2.  CXR on 06/14/16 demonstrated no pneumothorax status post device implantation.   Brief HPI/Hospital Course:  [name] is a 71 y.o. male with a past medical history as outlined above.  He was seen by his PCP for evaluation of weakness and dizziness. He was found to be bradycardic and advised to go to [facility] for further evaluation.  He was then transferred to [facility] as possible STEMI.  EKG was not consistent with STEMI, but he did have junctional bradycardia with a LBBB escape and rates in the 30's.  He is on no AVN blocking agents.  Echo 05/2015 demonstrated normal LVEF. Myoview 09/2015 was low risk with normal LVEF. He has had no functional limitations at home prior to onset of weakness and dizziness.  The patient was admitted and underwent implantation of a MDT dual chamber PPM with details as outlined above.  He  was monitored on telemetry overnight which demonstrated AV pacing.  Left chest was without hematoma or ecchymosis.  The device was interrogated and found to be functioning normally.  CXR was obtained and demonstrated no  pneumothorax status post device implantation.  Wound care, arm mobility, and restrictions were reviewed with the patient.  The patient was examined and considered stable for discharge to home.    Physical Exam: Vitals:   06/13/16 1639 06/13/16 2014 06/14/16 0007 06/14/16 0406  BP: (!) 163/70 (!) 158/66 105/61 (!) 153/62  Pulse: 85 84 73 95  Resp: 16  12 17   Temp: 98.6 F (37 C) 98.4 F (36.9 C) 97.9 F (36.6 C) 98 F (36.7 C)  TempSrc: Oral Oral Oral Oral  SpO2: 97% 98% 98%   Weight:    264 lb 9.6 oz (120 kg)  Height:        GEN- The patient is well appearing, alert and oriented x 3 today.   HEENT: normocephalic, atraumatic; sclera clear, conjunctiva pink; hearing intact; oropharynx clear; neck supple  Lungs- Clear to ausculation bilaterally, normal work of breathing.  No wheezes, rales, rhonchi Heart- Regular rate and rhythm, no murmurs, rubs or gallops  GI- soft, non-tender, non-distended, bowel sounds present  Extremities- no clubbing, cyanosis, or edema  MS- no significant deformity or atrophy Skin- warm and dry, no rash or lesion, left chest without hematoma/ecchymosis Psych- euthymic mood, full affect Neuro- strength and sensation are intact   Labs:   Lab Results  Component Value Date   WBC 7.9 06/12/2016   HGB 12.0 (L) 06/12/2016   HCT 36.1 (L) 06/12/2016   MCV 94.0 06/12/2016   PLT 138 (L) 06/12/2016     Recent Labs Lab 06/12/16 1134 06/12/16 1502  NA 135  --   K 4.3  --  CL 102  --   CO2 23  --   BUN 25*  --   CREATININE 1.37* 1.37*  CALCIUM 8.9  --   PROT 6.5  --   BILITOT 0.6  --   ALKPHOS 57  --   ALT 18  --   AST 22  --   GLUCOSE 249*  --     Discharge Medications:  Allergies as of 06/14/2016      Reactions   Phenothiazines Anaphylaxis   Actos [pioglitazone] Swelling      Medication List    STOP taking these medications   aspirin 325 MG tablet Replaced by:  aspirin 81 MG chewable tablet   ciprofloxacin 500 MG tablet Commonly  known as:  CIPRO     TAKE these medications   acetaminophen 500 MG tablet Commonly known as:  TYLENOL Take 1,000 mg by mouth every 6 (six) hours as needed (pain).   aspirin 81 MG chewable tablet Chew 1 tablet (81 mg total) by mouth daily. Replaces:  aspirin 325 MG tablet   cholecalciferol 1000 units tablet Commonly known as:  VITAMIN D Take 1,000 Units by mouth 2 (two) times daily.   citalopram 20 MG tablet Commonly known as:  CELEXA Take 1 tablet (20 mg total) by mouth daily.   Fish Oil 1200 MG Caps Take 1,200-2,400 mg by mouth 2 (two) times daily. Take 1 capsule (1200 mg) by mouth daily at noon and 2 capsules (2400 mg) daily at bedtime   furosemide 20 MG tablet Commonly known as:  LASIX Take 20 mg by mouth daily.   glucose blood test strip 1 strip by Does not apply route 3 (three) times daily. Uses true metrix strips (diabetic club)   insulin detemir 100 UNIT/ML injection Commonly known as:  LEVEMIR Inject 0.48 mLs (48 Units total) into the skin 2 (two) times daily.   Insulin Pen Needle 31G X 8 MM Misc Commonly known as:  B-D ULTRAFINE III SHORT PEN Use to inject Victoza qd   levothyroxine 200 MCG tablet Commonly known as:  SYNTHROID, LEVOTHROID Take 1 tablet (200 mcg total) by mouth daily before breakfast.   liraglutide 18 MG/3ML Sopn Inject 0.3 mLs (1.8 mg total) into the skin daily.   MENS MULTI VITAMIN & MINERAL PO Take 1 tablet by mouth daily.   metFORMIN 1000 MG tablet Commonly known as:  GLUCOPHAGE TAKE 1 TABLET BY MOUTH TWICE DAILY WITH A MEAL.   simvastatin 80 MG tablet Commonly known as:  ZOCOR Take 40 mg by mouth at bedtime.   tamsulosin 0.4 MG Caps capsule Commonly known as:  FLOMAX Take 0.4 mg by mouth at bedtime.       Disposition:  Discharge Instructions    Diet - low sodium heart healthy    Complete by:  As directed    Increase activity slowly    Complete by:  As directed      Follow-up Information    [facility]  Office Follow up on 06/30/2016.   Specialty:  Cardiology Why:  at [facility] for wound check  Contact information: [address], [address] [place]       Evans Lance, MD Follow up on 09/12/2016.   Specialty:  Cardiology Why:  at 8:15AM  Contact information: [place] [address] [phone]           Duration of Discharge Encounter: Greater than 30 minutes including physician time.  Signed, Museum/gallery conservator  Lynnell Jude, NP 06/14/2016 8:16 AM  EP Attending  Patient seen and examined. Agree with above. He is stable for DC. Interrogation of his PPM under my supervision demonstrates normal device function. DC with usual followup.  Mikle Bosworth.D.

## 2016-06-13 NOTE — Consult Note (Signed)
ELECTROPHYSIOLOGY CONSULT NOTE    Patient ID: [name] MRN: [id number], DOB/AGE: [birth year] 73 y.o.  Admit date: 06/12/2016 Date of Consult: @TODAY @  Primary Physician: Timmothy Euler, MD Primary Cardiologist: Hochrein  Reason for Consultation: symptomatic bradycardia  HPI:  [name] is a 71 y.o. male is referred by Dr Sallyanne Kuster for evaluation of bradycardia.  Past medical history is significant for prior AVR, HTN, hypothyroidism, obesity.  He was seen by his PCP for evaluation of weakness and dizziness. He was found to be bradycardic and advised to go to [facility] for further evaluation.  He was then transferred to [facility] as possible STEMI.  EKG was not consistent with STEMI, but he did have junctional bradycardia with a LBBB escape and rates in the 30's.  He is on no AVN blocking agents.  Echo 05/2015 demonstrated normal LVEF. Myoview 09/2015 was low risk with normal LVEF. He has had no functional limitations at home prior to onset of weakness and dizziness.    He currently denies chest pain, shortness of breath, LE edema, recent fevers, chills, nausea or vomiting.   Past Medical History:  Diagnosis Date  . Arthritis   . BPH (benign prostatic hypertrophy) 04/15/2010  . Colon polyps   . DEPRESSION 01/22/2009  . Heart valve replaced   . HYPERCHOLESTEROLEMIA 01/22/2009  . HYPERTENSION 01/22/2009   Dr. [name]  . 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 Morbid obesity ([place])   . Tremors of nervous system    ?ptsd  . Ulcer      Surgical History:  Past Surgical History:  Procedure Laterality Date  . 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 APPENDECTOMY    . bilateral cataract surg    . COLONOSCOPY  04/24/2007   [name]: normal  . COLONOSCOPY WITH ESOPHAGOGASTRODUODENOSCOPY (EGD) N/A 04/05/2012   Procedure: COLONOSCOPY WITH ESOPHAGOGASTRODUODENOSCOPY (EGD);  Surgeon: Daneil Dolin, MD;  Location: [facility];  Service: Endoscopy;  Laterality: N/A;  10;15  . DENTAL SURGERY  05/2004   Dental extractions  . HEEL SPUR SURGERY Bilateral    resection of heel spur  . KNEE ARTHROSCOPY WITH LATERAL MENISECTOMY Right 04/04/2014   Procedure: KNEE ARTHROSCOPY WITH LATERAL MENISECTOMY;  Surgeon: Carole Civil, MD;  Location: [facility] ORS;  Service: Orthopedics;  Laterality: Right;  . THYROIDECTOMY  03/21/2013   DR [name]  . THYROIDECTOMY N/A 03/21/2013   Procedure: THYROIDECTOMY;  Surgeon: [name], MD;  Location: [place];  Service: General;  Laterality: N/A;  . TONSILLECTOMY       Facility-Administered Medications Prior to Admission  Medication Dose Route Frequency Provider Last Rate Last Dose  . cyanocobalamin ((VITAMIN B-12)) injection 1,000 mcg  1,000 mcg Intramuscular Q30 days Claretta Fraise, MD   1,000 mcg at 12/23/14 1110   Prescriptions Prior to Admission  Medication Sig Dispense Refill Last Dose  . acetaminophen (TYLENOL) 500 MG tablet Take 1,000 mg by mouth every 6 (six) hours as needed (pain).   Past Month at Unknown time  . aspirin 325 MG tablet Take 325 mg by mouth daily.   06/12/2016 at Unknown time  . cholecalciferol (VITAMIN D) 1000 units tablet Take 1,000 Units by mouth 2 (two) times daily.   06/12/2016 at Unknown time  . ciprofloxacin (CIPRO) 500 MG tablet Take 1 tablet (500 mg total) by mouth 2 (two) times daily. 20 tablet 0 06/12/2016 at Unknown time  . citalopram (CELEXA) 20 MG  tablet Take 1 tablet (20 mg total) by mouth daily. 30 tablet 0 06/12/2016 at Unknown time  . furosemide (LASIX) 20 MG tablet Take 20 mg by mouth daily.   06/12/2016 at Unknown time  . glucose blood test strip 1 strip by Does not apply route 3 (three) times daily. Uses true metrix strips (diabetic club)  12 06/11/2016 at Unknown time  . insulin detemir (LEVEMIR) 100 UNIT/ML injection Inject 0.48 mLs (48 Units total) into the skin 2 (two) times daily. 10 mL  06/12/2016 at Unknown time  . Insulin Pen Needle (B-D  ULTRAFINE III SHORT PEN) 31G X 8 MM MISC Use to inject Victoza qd 100 each 5 06/12/2016 at Unknown time  . levothyroxine (SYNTHROID, LEVOTHROID) 200 MCG tablet Take 1 tablet (200 mcg total) by mouth daily before breakfast. 90 tablet 3 06/12/2016 at Unknown time  . liraglutide 18 MG/3ML SOPN Inject 0.3 mLs (1.8 mg total) into the skin daily. 1 pen 0 06/12/2016 at Unknown time  . metFORMIN (GLUCOPHAGE) 1000 MG tablet TAKE 1 TABLET BY MOUTH TWICE DAILY WITH A MEAL. 180 tablet 0 06/12/2016 at Unknown time  . Multiple Vitamins-Minerals (MENS MULTI VITAMIN & MINERAL PO) Take 1 tablet by mouth daily.     06/12/2016 at Unknown time  . Omega-3 Fatty Acids (FISH OIL) 1200 MG CAPS Take 1,200-2,400 mg by mouth 2 (two) times daily. Take 1 capsule (1200 mg) by mouth daily at noon and 2 capsules (2400 mg) daily at bedtime   06/12/2016 at Unknown time  . simvastatin (ZOCOR) 80 MG tablet Take 40 mg by mouth at bedtime.    06/11/2016 at Unknown time  . tamsulosin (FLOMAX) 0.4 MG CAPS capsule Take 0.4 mg by mouth at bedtime.   06/11/2016 at Unknown time    Inpatient Medications: . aspirin  81 mg Oral Daily  . atorvastatin  40 mg Oral q1800  . chlorhexidine  60 mL Topical Once  . citalopram  20 mg Oral Daily  . furosemide  20 mg Oral Daily  . gentamicin irrigation  80 mg Irrigation On Call  . heparin  5,000 Units Subcutaneous Q8H  . insulin aspart  0-15 Units Subcutaneous TID WC  . insulin aspart  0-5 Units Subcutaneous QHS  . insulin detemir  48 Units Subcutaneous BID  . levothyroxine  200 mcg Oral QAC breakfast  . liraglutide  1.8 mg Subcutaneous Daily  . metFORMIN  1,000 mg Oral BID WC  . sodium chloride flush  3 mL Intravenous Q12H  . tamsulosin  0.4 mg Oral QHS    Allergies:  Allergies  Allergen Reactions  . Phenothiazines Anaphylaxis  . Actos [Pioglitazone] Swelling    Social History   Social History  . Marital status: Single    Spouse name: N/A  . Number of children: 1  . Years of education: HS    Occupational History  . Truck Geophysicist/field seismologist, retired Retired   Social History Main Topics  . Smoking status: Former Smoker    Packs/day: 0.00    Years: 12.00    Types: Cigarettes    Quit date: 09/20/1961  . Smokeless tobacco: Never Used  . Alcohol use No  . Drug use: No  . Sexual activity: No     Comment: Has not smoked in 20 years   Other Topics Concern  . Not on file   Social History Narrative   Lives alone.   Quit smoking approximately 28 years ago.   Long haul truck driver, lives in [place].  Family History  Problem Relation Age of Onset  . Diabetes Unknown      Review of Systems: All other systems reviewed and are otherwise negative except as noted above.  Physical Exam: Vitals:   06/12/16 1717 06/12/16 2001 06/12/16 2348 06/13/16 0435  BP: (!) 142/56 (!) 127/44 (!) 119/59 127/70  Pulse: (!) 40 (!) 40  (!) 41  Resp: 13 17 14 16   Temp: 97.6 F (36.4 C) 97.9 F (36.6 C) 98.1 F (36.7 C) 97.4 F (36.3 C)  TempSrc: Oral Oral Oral Oral  SpO2: 99% 98% 99% 96%  Weight:    266 lb 9.6 oz (120.9 kg)  Height:        GEN- The patient is well appearing, alert and oriented x 3 today.   HEENT: normocephalic, atraumatic; sclera clear, conjunctiva pink; hearing intact; oropharynx clear; neck supple  Lungs- Clear to ausculation bilaterally, normal work of breathing.  No wheezes, rales, rhonchi Heart- Bradycardic regular rate and rhythm, no murmurs, rubs or gallops  GI- soft, non-tender, non-distended, bowel sounds present  Extremities- no clubbing, cyanosis, or edema  MS- no significant deformity or atrophy Skin- warm and dry, no rash or lesion Psych- euthymic mood, full affect Neuro- strength and sensation are intact  Labs:  Lab Results  Component Value Date   WBC 7.9 06/12/2016   HGB 12.0 (L) 06/12/2016   HCT 36.1 (L) 06/12/2016   MCV 94.0 06/12/2016   PLT 138 (L) 06/12/2016    Recent Labs Lab 06/12/16 1134 06/12/16 1502  NA 135  --   K 4.3  --   CL  102  --   CO2 23  --   BUN 25*  --   CREATININE 1.37* 1.37*  CALCIUM 8.9  --   PROT 6.5  --   BILITOT 0.6  --   ALKPHOS 57  --   ALT 18  --   AST 22  --   GLUCOSE 249*  --       Radiology/Studies: No results found.  OHY:WVPXTGGYIR rhythm, rate 41, LBBB escape (personally reviewed)  TELEMETRY: junctional rhythm, 30's (personally reviewed)   Assessment/Plan: 1.  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 Plan dual chamber PPM implant (will discuss with Dr Lovena Le His Bundle pacing as he may require a significant amount of RV pacing in the future with prior conduction system disease). Risks, benefits reviewed with patient who wishes to proceed. Will plan as the schedule allows  2.  S/p AVR Update echo   3.  HTN Stable No change required today  Dr Lovena Le to see later today    Signed, Chanetta Marshall, NP 06/13/2016 6:46 AM  EP Attending  Patient seen and examined. Agree with the findings as documented above. The patient has a h/o symptomatic bradycardia, and a h/o AVR, HTN, and a junctional escape. On exam the patient is a pleasant stable appearing 71 year old man no distress. Cardiovascular exam revealed a regular bradycardia. The lungs were clear. Abdominal sounds soft nontender. Extremities demonstrated no edema. Neurologic exam was nonfocal. I have discussed the treatment options with the patient. The risk, goals, benefits, and expectations of permanent dual-chamber pacemaker insertion were reviewed and he wishes to proceed.  Cristopher Peru, M.D.

## 2016-06-14 ENCOUNTER — Inpatient Hospital Stay (HOSPITAL_COMMUNITY): Payer: Medicare Other

## 2016-06-14 ENCOUNTER — Telehealth: Payer: Self-pay | Admitting: Family Medicine

## 2016-06-14 LAB — GLUCOSE, CAPILLARY: Glucose-Capillary: 129 mg/dL — ABNORMAL HIGH (ref 65–99)

## 2016-06-14 MED ORDER — ASPIRIN 81 MG PO CHEW: 81.0000 mg | CHEWABLE_TABLET | Freq: Every day | ORAL | Status: DC

## 2016-06-14 NOTE — Telephone Encounter (Signed)
lmtcb

## 2016-06-14 NOTE — Progress Notes (Addendum)
Reviewed discharge instructions with patient and he stated his understanding.  Reviewed arm limitations with patient also.  Discharged home with family via wheelchair. [name]  Belongings found in room after patient discharged home. I called home telephone number and left message.  [name]

## 2016-06-14 NOTE — Discharge Instructions (Signed)
Supplemental Discharge Instructions for  Pacemaker/Defibrillator Patients  Activity No heavy lifting or vigorous activity with your left/right arm for 6 to 8 weeks.  Do not raise your left/right arm above your head for one week.  Gradually raise your affected arm as drawn below.           __        06/17/16                      06/18/16                  06/19/16                  06/20/16  NO DRIVING for  1 week   ; you may begin driving on   8/91/69  .  WOUND CARE - Keep the wound area clean and dry.  Do not get this area wet for one week. No showers for one week; you may shower on  06/20/16   . - The tape/steri-strips on your wound will fall off; do not pull them off.  No bandage is needed on the site.  DO  NOT apply any creams, oils, or ointments to the wound area. - If you notice any drainage or discharge from the wound, any swelling or bruising at the site, or you develop a fever > 101? F after you are discharged home, call the office at once.  Special Instructions - You are still able to use cellular telephones; use the ear opposite the side where you have your pacemaker/defibrillator.  Avoid carrying your cellular phone near your device. - When traveling through airports, show security personnel your identification card to avoid being screened in the metal detectors.  Ask the security personnel to use the hand wand. - Avoid arc welding equipment, MRI testing (magnetic resonance imaging), TENS units (transcutaneous nerve stimulators).  Call the office for questions about other devices. - Avoid electrical appliances that are in poor condition or are not properly grounded. - Microwave ovens are safe to be near or to operate.      Bradycardia, Adult Bradycardia is a slower-than-normal heartbeat. A normal resting heart rate for an adult ranges from 60 to 100 beats per minute. With bradycardia, the resting heart rate is less than 60 beats per minute. Bradycardia can prevent enough  oxygen from reaching certain areas of your body when you are active. It can be serious if it keeps enough oxygen from reaching your brain and other parts of your body. Bradycardia is not a problem for everyone. For some healthy adults, a slow resting heart rate is normal. What are the causes? This condition may be caused by:  A problem with the heart, including:  A problem with the heart's electrical system, such as a heart block.  A problem with the heart's natural pacemaker (sinus node).  Heart disease.  A heart attack.  Heart damage.  A heart infection.  A heart condition that is present at birth (congenital heart defect).  Certain medicines that treat heart conditions.  Certain conditions, such as hypothyroidism and obstructive sleep apnea.  Problems with the balance of chemicals and other substances, like potassium, in the blood. What increases the risk? This condition is more likely to develop in adults who:  Are age 73 or older.  Have high blood pressure (hypertension), high cholesterol (hyperlipidemia), or diabetes.  Drink heavily, use tobacco or nicotine products, or use drugs.  Are stressed.  What are the signs or symptoms? Symptoms of this condition include:  Light-headedness.  Feeling faint or fainting.  Fatigue and weakness.  Shortness of breath.  Chest pain (angina).  Drowsiness.  Confusion.  Dizziness. How is this diagnosed? This condition may be diagnosed based on:  Your symptoms.  Your medical history.  A physical exam. During the exam, your health care provider will listen to your heartbeat and check your pulse. To confirm the diagnosis, your health care provider may order tests, such as:  Blood tests.  An electrocardiogram (ECG). This test records the heart's electrical activity. The test can show how fast your heart is beating and whether the heartbeat is steady.  A test in which you wear a portable device (event recorder or Holter  monitor) to record your heart's electrical activity while you go about your day.  Anexercise test. How is this treated? Treatment for this condition depends on the cause of the condition and how severe your symptoms are. Treatment may involve:  Treatment of the underlying condition.  Changing your medicines or how much medicine you take.  Having a small, battery-operated device called a pacemaker implanted under the skin. When bradycardia occurs, this device can be used to increase your heart rate and help your heart to beat in a regular rhythm. Follow these instructions at home: Lifestyle    Manage any health conditions that contribute to bradycardia as told by your health care provider.  Follow a heart-healthy diet. A nutrition specialist (dietitian) can help to educate you about healthy food options and changes.  Follow an exercise program that is approved by your health care provider.  Maintain a healthy weight.  Try to reduce or manage your stress, such as with yoga or meditation. If you need help reducing stress, ask your health care provider.  Do not use use any products that contain nicotine or tobacco, such as cigarettes and e-cigarettes. If you need help quitting, ask your health care provider.  Do not use illegal drugs.  Limit alcohol intake to no more than 1 drink per day for nonpregnant women and 2 drinks per day for men. One drink equals 12 oz of beer, 5 oz of wine, or 1 oz of hard liquor. General instructions   Take over-the-counter and prescription medicines only as told by your health care provider.  Keep all follow-up visits as directed by your health care provider. This is important. How is this prevented? In some cases, bradycardia may be prevented by:  Treating underlying medical problems.  Stopping behaviors or medicines that can trigger the condition. Contact a health care provider if:  You feel light-headed or dizzy.  You almost faint.  You  feel weak or are easily fatigued during physical activity.  You experience confusion or have memory problems. Get help right away if:  You faint.  You have an irregular heartbeat (palpitations).  You have chest pain.  You have trouble breathing. This information is not intended to replace advice given to you by your health care provider. Make sure you discuss any questions you have with your health care provider. Document Released: 10/02/2001 Document Revised: 09/08/2015 Document Reviewed: 07/02/2015 Elsevier Interactive Patient Education  2017 [employer].   Pacemaker Implantation, Adult, Care After This sheet gives you information about how to care for yourself after your procedure. Your health care provider may also give you more specific instructions. If you have problems or questions, contact your health care provider. What can I expect after the procedure? After  the procedure, it is common to have:  Mild pain.  Slight bruising.  Some swelling over the incision.  A slight bump over the skin where the device was placed. Sometimes, it is possible to feel the device under the skin. This is normal. Follow these instructions at home: Medicines   Take over-the-counter and prescription medicines only as told by your health care provider.  If you were prescribed an antibiotic medicine, take it as told by your health care provider. Do not stop taking the antibiotic even if you start to feel better. Wound care   Do not remove the bandage on your chest until directed to do so by your health care provider.  After your bandage is removed, you may see pieces of tape called skin adhesive strips over the area where the cut was made (incision site). Let them fall off on their own.  Check the incision site every day to make sure it is not infected, bleeding, or starting to pull apart.  Do not use lotions or ointments near the incision site unless directed to do so.  Keep the  incision area clean and dry for 2-3 days after the procedure or as directed by your health care provider. It takes several weeks for the incision site to completely heal.  Do not take baths, swim, or use a hot tub for 7-10 days or as otherwise directed by your health care provider. Activity   Do not drive or use heavy machinery while taking prescription pain medicine.  Do not drive for 24 hours if you were given a medicine to help you relax (sedative).  Check with your health care provider before you start to drive or play sports.  Avoid sudden jerking, pulling, or chopping movements that pull your upper arm far away from your body. Avoid these movements for at least 6 weeks or as long as told by your health care provider.  Do not lift your upper arm above your shoulders for at least 6 weeks or as long as told by your health care provider. This means no tennis, golf, or swimming.  You may go back to work when your health care provider says it is okay. Pacemaker care   You may be shown how to transfer data from your pacemaker through the phone to your health care provider.  Always let all health care providers know about your pacemaker before you have any medical procedures or tests.  Wear a medical ID bracelet or necklace stating that you have a pacemaker. Carry a pacemaker ID card with you at all times.  Your pacemaker battery will last for 5-15 years. Routine checks by your health care provider will let the health care provider know when the battery is starting to run down. The pacemaker will need to be replaced when the battery starts to run down.  Do not use amateur Chief of Staff. Other electrical devices are safe to use, including power tools, lawn mowers, and speakers. If you are unsure of whether something is safe to use, ask your health care provider.  When using your cell phone, hold it to the ear opposite the pacemaker. Do not leave your cell phone  in a pocket over the pacemaker.  Avoid places or objects that have a strong electric or magnetic field, including:  Engineer, maintenance. When at the airport, let officials know that you have a pacemaker.  Power plants.  Large electrical generators.  Radiofrequency transmission towers, such as cell phone  and radio towers. General instructions   Weigh yourself every day. If you suddenly gain weight, fluid may be building up in your body.  Keep all follow-up visits as told by your health care provider. This is important. Contact a health care provider if:  You gain weight suddenly.  Your legs or feet swell.  It feels like your heart is fluttering or skipping beats (heart palpitations).  You have chills or a fever.  You have more redness, swelling, or pain around your incisions.  You have more fluid or blood coming from your incisions.  Your incisions feel warm to the touch.  You have pus or a bad smell coming from your incisions. Get help right away if:  You have chest pain.  You have trouble breathing or are short of breath.  You become extremely tired.  You are light-headed or you faint. This information is not intended to replace advice given to you by your health care provider. Make sure you discuss any questions you have with your health care provider. Document Released: 07/30/2004 Document Revised: 10/23/2015 Document Reviewed: 10/23/2015 Elsevier Interactive Patient Education  2017 [employer].

## 2016-06-14 NOTE — Plan of Care (Signed)
Problem: Education: Goal: Knowledge of [facility] Education information/materials will improve Outcome: Progressing Patient continuously educated on his restriction post-pacemaker placement. Sling was incorrectly placed, RN showed patient correct way to have arm placed. Patient unable to lay on L side 24 hours post procedure.   Problem: Skin Integrity: Goal: Risk for impaired skin integrity will decrease Outcome: Progressing Dressing over pacemaker placement site CDI, no drainage noted.   Problem: Fluid Volume: Goal: Ability to maintain a balanced intake and output will improve Outcome: Progressing Unable to monitor output due to patient's inability to use urinal correctly. Condom cath attempted to be placed, unsuccessful due to anatomy. Patient unable to hold void to wait for assistance. Bedding has been changed twice due to incontinence.

## 2016-06-15 NOTE — Telephone Encounter (Signed)
Pt wanted to know his pulse rate from Friday's visit with Dr. Wendi Snipes which was 59 per records.  He woke up Saturday at 29 went to [facility] who sent him to [facility] where he had a pacemaker put in. He will call back to schedule his regular check up with Dr. Wendi Snipes

## 2016-06-16 ENCOUNTER — Telehealth: Payer: Self-pay | Admitting: Family Medicine

## 2016-06-16 MED ORDER — INSULIN DETEMIR 100 UNIT/ML ~~LOC~~ SOLN: 40.0000 [IU] | Freq: Two times a day (BID) | SUBCUTANEOUS | Status: DC

## 2016-06-16 NOTE — Telephone Encounter (Signed)
Taking 48 units twice a day of Levemir.  Had BG of 65 today and 72.   Recommended decrease Levemir to 40 unit twice a day.  Patient is to call office if continues to have lows less than 80 or if BG increased in am to over 150.

## 2016-06-21 ENCOUNTER — Telehealth: Payer: Self-pay | Admitting: Internal Medicine

## 2016-06-21 NOTE — Telephone Encounter (Signed)
Attempted to reach pt x 2, line busy

## 2016-06-21 NOTE — Telephone Encounter (Signed)
Per pt phone call--he's having some fluid build up and he's taking the furosemide (LASIX) 20 MG tablet [[id number]],would like to know what else he needs to do. Had a pacemaker placed on 06/07/16 by Dr. Noemi Chapel

## 2016-06-21 NOTE — Telephone Encounter (Signed)
Patient could not elaborate what fluid build up is.Denied swelling in ankles,abdomen, hands.Denies SOB.Stated his weight was 266 lbs at home and he took his usual lasix 20 mg and lost 6 lbs, wants to see Dr Lovena Le

## 2016-06-22 NOTE — Telephone Encounter (Addendum)
Apt made at pt request with Dr Lovena Le on 06/23/16 at 9 am, I called pt to inform him and he said cancel apt ,that was too early for him.i told him to call back for any further isuses,he agreed

## 2016-06-23 ENCOUNTER — Ambulatory Visit: Payer: Medicare Other | Admitting: Internal Medicine

## 2016-06-29 ENCOUNTER — Ambulatory Visit: Payer: Medicare Other

## 2016-06-30 ENCOUNTER — Ambulatory Visit: Payer: Medicare Other

## 2016-07-01 ENCOUNTER — Encounter: Payer: Self-pay | Admitting: Internal Medicine

## 2016-07-01 ENCOUNTER — Ambulatory Visit (INDEPENDENT_AMBULATORY_CARE_PROVIDER_SITE_OTHER): Payer: Medicare Other | Admitting: *Deleted

## 2016-07-01 DIAGNOSIS — I442 Atrioventricular block, complete: Secondary | ICD-10-CM

## 2016-07-01 NOTE — Progress Notes (Signed)
Wound check appointment. Steri-strips removed. Wound without redness or edema. Incision edges approximated, wound well healed. Normal device function. Thresholds, sensing, and impedances consistent with implant measurements. RV/HIS bundle pacing septal. Device programmed at 3.5V/auto capture programmed on for extra safety margin until 3 month visit. Histogram distribution appropriate for patient and level of activity. 20AT/AF~ EGMs appear FFRW. Patient educated about wound care, arm mobility, lifting restrictions. ROV 09/12/2016 w/ GT

## 2016-07-04 ENCOUNTER — Encounter: Payer: Self-pay | Admitting: Pharmacist

## 2016-07-04 ENCOUNTER — Ambulatory Visit (INDEPENDENT_AMBULATORY_CARE_PROVIDER_SITE_OTHER): Payer: Medicare Other | Admitting: Pharmacist

## 2016-07-04 VITALS — BP 126/70 | HR 80 | Ht 72.25 in | Wt 270.0 lb

## 2016-07-04 DIAGNOSIS — E119 Type 2 diabetes mellitus without complications: Secondary | ICD-10-CM

## 2016-07-04 DIAGNOSIS — Z0000 Encounter for general adult medical examination without abnormal findings: Secondary | ICD-10-CM | POA: Diagnosis not present

## 2016-07-04 LAB — BAYER DCA HB A1C WAIVED: HB A1C (BAYER DCA - WAIVED): 8.5 % — ABNORMAL HIGH (ref <7.0)

## 2016-07-04 MED ORDER — INSULIN DETEMIR 100 UNIT/ML ~~LOC~~ SOLN: 47.0000 [IU] | Freq: Two times a day (BID) | SUBCUTANEOUS | Status: DC

## 2016-07-04 NOTE — Patient Instructions (Addendum)
[name] , Thank you for taking time to come for your Medicare Wellness Visit. I appreciate your ongoing commitment to your health goals. Please review the following plan we discussed and let me know if I can assist you in the future.   These are the goals we discussed:  Increase non-starchy vegetables - carrots, green bean, squash, zucchini, tomatoes, onions, peppers, spinach and other green leafy vegetables, cabbage, lettuce, cucumbers, asparagus, okra (not fried), eggplant Limit sugar and processed foods (cakes, cookies, ice cream, crackers and chips) Increase fresh fruit but limit serving sizes 1/2 cup or about the size of tennis or baseball; 1/2 banana  Limit red meat to no more than 1-2 times per week (serving size about the size of your palm) Choose whole grains / lean proteins - whole wheat bread, quinoa, whole grain rice (1/2 cup), fish, chicken, [place] Avoid sugar and calorie containing beverages - soda, sweet tea and juice.  Choose water or unsweetened tea instead.  Continue to exercise daily - goal is to get 150 minutes or more of exercise per week.    This is a list of the screening recommended for you and due dates:  Health Maintenance  Topic Date Due  . Urine Protein Check  03/24/2016 - cup given to bring sample back  . Flu Shot  08/24/2016  . Pneumonia vaccines (2 of 2 - PPSV23) 10/01/2016  . Hemoglobin A1C  10/12/2016  . Colon Cancer Screening  04/05/2017  . Eye exam for diabetics  04/26/2017  . Complete foot exam   07/04/2017  . Tetanus Vaccine  01/24/2022  .  Hepatitis C: One time screening is recommended by Center for Disease Control  (CDC) for  adults born from [birth year] through 1967.   Completed   Fall Prevention in the Home Falls can cause injuries and can affect people from all age groups. There are many simple things that you can do to make your home safe and to help prevent falls. What can I do on the outside of my home?  Regularly repair the edges of  walkways and driveways and fix any cracks.  Remove high doorway thresholds.  Trim any shrubbery on the main path into your home.  Use bright outdoor lighting.  Clear walkways of debris and clutter, including tools and rocks.  Regularly check that handrails are securely fastened and in good repair. Both sides of any steps should have handrails.  Install guardrails along the edges of any raised decks or porches.  Have leaves, snow, and ice cleared regularly.  Use sand or salt on walkways during winter months.  In the garage, clean up any spills right away, including grease or oil spills. What can I do in the bathroom?  Use night lights.  Install grab bars by the toilet and in the tub and shower. Do not use towel bars as grab bars.  Use non-skid mats or decals on the floor of the tub or shower.  If you need to sit down while you are in the shower, use a plastic, non-slip stool.  Keep the floor dry. Immediately clean up any water that spills on the floor.  Remove soap buildup in the tub or shower on a regular basis.  Attach bath mats securely with double-sided non-slip rug tape.  Remove throw rugs and other tripping hazards from the floor. What can I do in the bedroom?  Use night lights.  Make sure that a bedside light is easy to reach.  Do not use oversized  bedding that drapes onto the floor.  Have a firm chair that has side arms to use for getting dressed.  Remove throw rugs and other tripping hazards from the floor. What can I do in the kitchen?  Clean up any spills right away.  Avoid walking on wet floors.  Place frequently used items in easy-to-reach places.  If you need to reach for something above you, use a sturdy step stool that has a grab bar.  Keep electrical cables out of the way.  Do not use floor polish or wax that makes floors slippery. If you have to use wax, make sure that it is non-skid floor wax.  Remove throw rugs and other tripping hazards  from the floor. What can I do in the stairways?  Do not leave any items on the stairs.  Make sure that there are handrails on both sides of the stairs. Fix handrails that are broken or loose. Make sure that handrails are as long as the stairways.  Check any carpeting to make sure that it is firmly attached to the stairs. Fix any carpet that is loose or worn.  Avoid having throw rugs at the top or bottom of stairways, or secure the rugs with carpet tape to prevent them from moving.  Make sure that you have a light switch at the top of the stairs and the bottom of the stairs. If you do not have them, have them installed. What are some other fall prevention tips?  Wear closed-toe shoes that fit well and support your feet. Wear shoes that have rubber soles or low heels.  When you use a stepladder, make sure that it is completely opened and that the sides are firmly locked. Have someone hold the ladder while you are using it. Do not climb a closed stepladder.  Add color or contrast paint or tape to grab bars and handrails in your home. Place contrasting color strips on the first and last steps.  Use mobility aids as needed, such as canes, walkers, scooters, and crutches.  Turn on lights if it is dark. Replace any light bulbs that burn out.  Set up furniture so that there are clear paths. Keep the furniture in the same spot.  Fix any uneven floor surfaces.  Choose a carpet design that does not hide the edge of steps of a stairway.  Be aware of any and all pets.  Review your medicines with your healthcare provider. Some medicines can cause dizziness or changes in blood pressure, which increase your risk of falling. Talk with your health care provider about other ways that you can decrease your risk of falls. This may include working with a physical therapist or trainer to improve your strength, balance, and endurance. This information is not intended to replace advice given to you by your  health care provider. Make sure you discuss any questions you have with your health care provider. Document Released: 12/31/2001 Document Revised: 06/09/2015 Document Reviewed: 02/14/2014 Elsevier Interactive Patient Education  2017 [employer].

## 2016-07-04 NOTE — Progress Notes (Signed)
Patient ID: [name], male   DOB: 01/22/1946, 71 y.o.   MRN: [id number]     Subjective:   [name] is a 71 y.o. male who presents for an Initial Medicare Annual Wellness Visit. He has recently has paceamake placed 06/12/2016 and states he feels so much better.   [name] is divorced.  He lives alone.  He has  43 yo son who is mentally handicapped.  His son lives with his mother.  [name] retired bout 8 years ago.  He served in the [employer] during [place] [employer] and [employer] benefits in [place], [place].  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   [name] has DM and checks BG twice daily.  I reviewed his glucometer records.  Noticed that date and time were off on glucometer and corrected.  7 day BG avg = 181 14 day BG avg = 193 AM readings = 199, 106, 134, 181, 149, 152, 197, 147 PM reading = 161. 208, 182, 197, 222, 259, 203, 164 We had decreased insulin / Levemir from 48 to 40 units bid because patient has been experiencing BG in 60's and 70's about 2 weeks ago.  Patient has increase Levemir some over last 1-2 weeks and is currently taking Levemir 45 units bid. Denied any hypoglycemic events within the last 2 weeks.  Current Medications (verified) Outpatient Encounter Prescriptions as of 07/04/2016  Medication Sig  . acetaminophen (TYLENOL) 500 MG tablet Take 1,000 mg by mouth every 6 (six) hours as needed (pain).  Marland Kitchen aspirin 81 MG chewable tablet Chew 1 tablet (81 mg total) by mouth daily.  . cholecalciferol (VITAMIN D) 1000 units tablet Take 1,000 Units by mouth 2 (two) times daily.  . citalopram (CELEXA) 20 MG tablet Take 1 tablet (20 mg total) by mouth daily.  . furosemide (LASIX) 20 MG tablet Take 20 mg by mouth daily.  Marland Kitchen glucose blood test strip 1 strip by Does not apply route 3 (three) times daily. Uses true metrix strips (diabetic club)  . glucose blood test strip DIABETIC CLUB  . insulin  detemir (LEVEMIR) 100 UNIT/ML injection Inject 0.47-0.48 mLs (47-48 Units total) into the skin 2 (two) times daily.  . Insulin Pen Needle (B-D ULTRAFINE III SHORT PEN) 31G X 8 MM MISC Use to inject Victoza qd  . levothyroxine (SYNTHROID, LEVOTHROID) 200 MCG tablet Take 1 tablet (200 mcg total) by mouth daily before breakfast.  . liraglutide 18 MG/3ML SOPN Inject 0.3 mLs (1.8 mg total) into the skin daily.  . metFORMIN (GLUCOPHAGE) 1000 MG tablet TAKE 1 TABLET BY MOUTH TWICE DAILY WITH A MEAL.  . Multiple Vitamins-Minerals (MENS MULTI VITAMIN & MINERAL PO) Take 1 tablet by mouth daily.    . Omega-3 Fatty Acids (FISH OIL) 1200 MG CAPS Take 1,200-2,400 mg by mouth 2 (two) times daily. Take 1 capsule (1200 mg) by mouth daily at noon and 2 capsules (2400 mg) daily at bedtime  . simvastatin (ZOCOR) 80 MG tablet Take 40 mg by mouth at bedtime.   . tamsulosin (FLOMAX) 0.4 MG CAPS capsule Take 0.4 mg by mouth at bedtime.  . [DISCONTINUED] insulin detemir (LEVEMIR) 100 UNIT/ML injection Inject 0.4 mLs (40 Units total) into the skin 2 (two) times daily.  . [DISCONTINUED] cyanocobalamin ((VITAMIN B-12)) injection 1,000 mcg    No facility-administered encounter medications on file as of 07/04/2016.  Allergies (verified) Phenothiazines and Actos [pioglitazone]   History: Past Medical History:  Diagnosis Date  . Arthritis   . BPH (benign prostatic hypertrophy) 04/15/2010  . Cataract   . Colon polyps   . DEPRESSION 01/22/2009  . Heart valve replaced   . HYPERCHOLESTEROLEMIA 01/22/2009  . HYPERTENSION 01/22/2009   Dr. [name]  . 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 Morbid obesity ([place])   . Tremors of nervous system    ?ptsd  . Ulcer    Past Surgical History:  Procedure Laterality Date  . 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 APPENDECTOMY    . bilateral cataract surg    . COLONOSCOPY  04/24/2007   [name]: normal  .  COLONOSCOPY WITH ESOPHAGOGASTRODUODENOSCOPY (EGD) N/A 04/05/2012   Procedure: COLONOSCOPY WITH ESOPHAGOGASTRODUODENOSCOPY (EGD);  Surgeon: Daneil Dolin, MD;  Location: [facility];  Service: Endoscopy;  Laterality: N/A;  10;15  . DENTAL SURGERY  05/2004   Dental extractions  . EYE SURGERY Bilateral    cataract  . HEEL SPUR SURGERY Bilateral    resection of heel spur  . KNEE ARTHROSCOPY WITH LATERAL MENISECTOMY Right 04/04/2014   Procedure: KNEE ARTHROSCOPY WITH LATERAL MENISECTOMY;  Surgeon: Carole Civil, MD;  Location: [facility];  Service: Orthopedics;  Laterality: Right;  . PACEMAKER IMPLANT N/A 06/13/2016   Procedure: Pacemaker Implant- Dual Chamber;  Surgeon: Evans Lance, MD;  Location: [facility];  Service: Cardiovascular;  Laterality: N/A;  . PACEMAKER INSERTION  2018  . THYROIDECTOMY  03/21/2013   DR [name]  . THYROIDECTOMY N/A 03/21/2013   Procedure: THYROIDECTOMY;  Surgeon: [name], MD;  Location: [facility];  Service: General;  Laterality: N/A;  . TONSILLECTOMY     Family History  Problem Relation Age of Onset  . Heart disease Father   . Congestive Heart Failure Father   . Arthritis Father   . Diabetes Unknown   . Benign prostatic hyperplasia Brother    Social History   Occupational History  . Truck Geophysicist/field seismologist, retired Retired   Social History Main Topics  . Smoking status: Former Smoker    Packs/day: 0.00    Years: 12.00    Types: Cigarettes    Quit date: 09/20/1961  . Smokeless tobacco: Never Used  . Alcohol use No  . Drug use: No  . Sexual activity: No     Comment: Has not smoked in 20 years    Do you feel safe at home?  Yes Are there smokers in your home (other than you)? No  Dietary issues and exercise activities discussed: Current Exercise Habits: Home exercise routine, Type of exercise: walking, Time (Minutes): 20, Frequency (Times/Week): 7, Weekly Exercise (Minutes/Week): 140, Intensity: Moderate  Current Dietary habits:  Eats out most  meal.   Cheeseburger + FF Cottage cheese No juice or soda.  Drinks water, unsweetened tea or diet soda   Objective:    Today's Vitals   07/04/16 1049  BP: 126/70  Pulse: 80  Weight: 270 lb (122.5 kg) - weight has decreased about 5# over last 2 months  Height: 6' 0.25" (1.835 m)   Body mass index is 36.37 kg/m.   A1c was 8.5% today (increased from 8.2% 3 months ago)  Activities of Daily Living In your present state of health, do you have any difficulty performing the following activities: 07/04/2016 06/12/2016  Hearing? Y N  Vision? N N  Difficulty concentrating or making  decisions? N N  Walking or climbing stairs? N N  Dressing or bathing? N N  Doing errands, shopping? N N  Preparing Food and eating ? N -  Using the Toilet? N -  In the past six months, have you accidently leaked urine? N -  Do you have problems with loss of bowel control? N -  Managing your Medications? N -  Managing your Finances? N -  Housekeeping or managing your Housekeeping? N -  Some recent data might be hidden     Depression Screen PHQ 2/9 Scores 07/04/2016 06/10/2016 06/03/2016 04/29/2016  PHQ - 2 Score 0 0 0 0  PHQ- 9 Score - - - -     Fall Risk Fall Risk  07/04/2016 06/10/2016 06/03/2016 04/29/2016 04/11/2016  Falls in the past year? Yes No No Yes No  Number falls in past yr: 1 - - 1 -  Injury with Fall? No - - Yes -  Risk for fall due to : - - - History of fall(s) -  Follow up Falls prevention discussed - - - -    Cognitive Function: MMSE - Mini Mental State Exam 07/04/2016  Orientation to time 5  Orientation to Place 5  Registration 3  Attention/ Calculation 4  Recall 2  Language- name 2 objects 2  Language- repeat 1  Language- follow 3 step command 3    Immunizations and Health Maintenance Immunization History  Administered Date(s) Administered  . Influenza Whole 09/24/2008  . Influenza, High Dose Seasonal PF 10/30/2013, 11/10/2015  . Influenza,inj,Quad PF,36+ Mos 11/02/2012,  11/10/2014  . Pneumococcal Conjugate-13 10/02/2015   Health Maintenance Due  Topic Date Due  . URINE MICROALBUMIN  03/24/2016   Diabetic Foot Form - Detailed   Diabetic Foot Exam - detailed Diabetic Foot exam was performed with the following findings:  Yes 07/04/2016 10:38 AM  Visual Foot Exam completed.:  Yes  Is there a history of foot ulcer?:  No (Comment: but he has had ulcer on right leg) Can the patient see the bottom of their feet?:  No Are the shoes appropriate in style and fit?:  Yes Is there swelling or and abnormal foot shape?:  No Are the toenails long?:  No Are the toenails thick?:  No Do you have pain in calf while walking?:  Yes Is there a claw toe deformity?:  No Is there elevated skin temparature?:  No Is there limited skin dorsiflexion?:  No Is there foot or ankle muscle weakness?:  No Are the toenails ingrown?:  No Normal Range of Motion:  Yes Pulse Foot Exam completed.:  Yes  Right posterior Tibialias:  Diminished Left posterior Tibialias:  Present  Right Dorsalis Pedis:  Present Left Dorsalis Pedis:  Diminished  Sensory Foot Exam Completed.:  Yes Swelling:  No Semmes-Weinstein Monofilament Test R Foot Test Control:  Pos L Foot Test Control:  Pos  R Site 1-Great Toe:  Pos L Site 1-Great Toe:  Neg  R Site 4:  Pos L Site 4:  Pos  R Site 5:  Pos L Site 5:  Pos        Patient Care Team: Timmothy Euler, MD as PCP - General (Family Medicine) Gala Romney, Cristopher Estimable, MD as Attending Physician (Gastroenterology)  Indicate any recent Medical Services you may have received from other than [facility] providers in the past year (date may be approximate).    Assessment:    Annual Wellness Visit  Uncontrolled type 2 DM    Screening Tests  Health Maintenance  Topic Date Due  . URINE MICROALBUMIN  03/24/2016  . INFLUENZA VACCINE  08/24/2016  . PNA vac Low Risk Adult (2 of 2 - PPSV23) 10/01/2016  . HEMOGLOBIN A1C  10/12/2016  . COLONOSCOPY  04/05/2017  . OPHTHALMOLOGY  EXAM  04/26/2017  . FOOT EXAM  07/04/2017  . TETANUS/TDAP  01/24/2022  . Hepatitis C Screening  Completed        Plan:   During the course of the visit Draken was educated and counseled about the following appropriate screening and preventive services:   Vaccines to include Pneumoccal, Influenza,  Td, and Shingles - patient is due Boostrix and Shingrix.  Checked price and would be $45 each.  Patient [facility] to get at [facility] where he can get for free.  Colorectal cancer screening - UTD, q5 years per Dr Guerry Minors last notes  Cardiovascular disease screening - UTD  Diabetes - increase Levmir to 37 units BID  Reviewed s/s for hypoglycemia and how to treat - he is to call if BG remain over 200 or if less than 70.   Reviewed CHO counting diet.  Reviewed recommended serving sizes.   Glaucoma screening / Diabetic Eye Exam - UTD  Foot exam performed today - no problems noted except a few areas of decreased sensation.  Patient educated about proper foot care and to check feet daily  Nutrition counseling - see above for DM  Advanced Directives - information provided  Physical Activity - increase as able to 30 minutes daily  Fall prevention discussed  Patient aware to f/u with PCP in July / August but he refused to make appt in office today.  States he will call office in July to make appt  Orders Placed This Encounter  Procedures  . Bayer DCA Hb A1c Waived  . Microalbumin / creatinine urine ratio   Pt was unable to void in office today but was given sample cup by lab to RTO   Patient Instructions (the written plan) were given to the patient.   Cherre Robins, PharmD   07/04/2016

## 2016-07-05 LAB — MICROALBUMIN / CREATININE URINE RATIO
CREATININE, UR: 42.8 mg/dL
MICROALB/CREAT RATIO: 28.7 mg/g{creat} (ref 0.0–30.0)
MICROALBUM., U, RANDOM: 12.3 ug/mL

## 2016-07-07 ENCOUNTER — Ambulatory Visit: Payer: Medicare Other | Admitting: Cardiology

## 2016-07-11 ENCOUNTER — Telehealth: Payer: Self-pay | Admitting: Family Medicine

## 2016-07-28 ENCOUNTER — Telehealth: Payer: Self-pay | Admitting: Family Medicine

## 2016-07-28 NOTE — Telephone Encounter (Signed)
Pt requesting Victoza samples Samples are ready for pick up

## 2016-08-05 ENCOUNTER — Ambulatory Visit: Payer: Medicare Other | Admitting: Urology

## 2016-08-08 ENCOUNTER — Telehealth: Payer: Self-pay | Admitting: Family Medicine

## 2016-08-08 MED ORDER — LIRAGLUTIDE 18 MG/3ML ~~LOC~~ SOPN: 1.8000 mg | PEN_INJECTOR | Freq: Every day | SUBCUTANEOUS | 1 refills | Status: DC

## 2016-08-08 NOTE — Telephone Encounter (Signed)
Patient needs Rx for Victoza sent to [facility] at fax number [phone].  Rx printed and faxed

## 2016-08-10 ENCOUNTER — Encounter: Payer: Self-pay | Admitting: Family Medicine

## 2016-08-10 ENCOUNTER — Ambulatory Visit (INDEPENDENT_AMBULATORY_CARE_PROVIDER_SITE_OTHER): Payer: Medicare Other | Admitting: Family Medicine

## 2016-08-10 VITALS — BP 124/61 | HR 86 | Temp 97.2°F | Ht 72.0 in | Wt 267.6 lb

## 2016-08-10 DIAGNOSIS — R350 Frequency of micturition: Secondary | ICD-10-CM | POA: Diagnosis not present

## 2016-08-10 DIAGNOSIS — R42 Dizziness and giddiness: Secondary | ICD-10-CM | POA: Diagnosis not present

## 2016-08-10 LAB — URINALYSIS, COMPLETE
BILIRUBIN UA: NEGATIVE
Ketones, UA: NEGATIVE
Nitrite, UA: NEGATIVE
PH UA: 5 (ref 5.0–7.5)
Protein, UA: NEGATIVE
Specific Gravity, UA: 1.01 (ref 1.005–1.030)
Urobilinogen, Ur: 0.2 mg/dL (ref 0.2–1.0)

## 2016-08-10 LAB — MICROSCOPIC EXAMINATION

## 2016-08-10 MED ORDER — MECLIZINE HCL 25 MG PO TABS: 25.0000 mg | ORAL_TABLET | Freq: Three times a day (TID) | ORAL | 0 refills | Status: DC | PRN

## 2016-08-10 MED ORDER — MECLIZINE HCL 32 MG PO TABS: 32.0000 mg | ORAL_TABLET | Freq: Three times a day (TID) | ORAL | 0 refills | Status: DC | PRN

## 2016-08-10 NOTE — Addendum Note (Signed)
Addended by: Thana Ates on: 08/10/2016 04:31 PM   Modules accepted: Orders

## 2016-08-10 NOTE — Addendum Note (Signed)
Addended by: Earlene Plater on: 08/10/2016 04:45 PM   Modules accepted: Miquel Dunn

## 2016-08-10 NOTE — Progress Notes (Signed)
Subjective:    Patient ID: [name], male    DOB: November 28, 1945, 71 y.o.   MRN: [id number]  HPI 71 year old gentleman with dizziness and not feeling well. Dizziness seems to be positional. He denies any hearing loss or tinnitus. He does have diabetes but tells me that his sugars recently have been pretty well controlled. He denies any chest pain. He did have pacemaker inserted about 3 months ago. There is no history of palpitations. There is also a history of carotid artery disease  Patient Active Problem List   Diagnosis Date Noted  . Symptomatic bradycardia 06/12/2016  . Groin pain   . Hypothyroidism 03/25/2015  . Diastolic CHF ([place]) 65/68/1275  . B12 deficiency 04/08/2014  . Lateral meniscal tear   . DM type 2, not at goal [facility]) 03/12/2014  . 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 GERD (gastroesophageal reflux disease) 11/02/2012  . Anemia 04/02/2012  . Obesity 08/04/2010  . Benign prostatic hyperplasia 04/15/2010  . CAROTID OCCLUSIVE DISEASE 01/26/2010  . MURMUR 05/27/2009  . Aortic valve disease 05/27/2009  . Hyperlipidemia with target LDL less than 100 01/22/2009  . Depression 01/22/2009  . Essential hypertension 01/22/2009   Outpatient Encounter Prescriptions as of 08/10/2016  Medication Sig  . acetaminophen (TYLENOL) 500 MG tablet Take 1,000 mg by mouth every 6 (six) hours as needed (pain).  Marland Kitchen aspirin 81 MG chewable tablet Chew 1 tablet (81 mg total) by mouth daily.  . cholecalciferol (VITAMIN D) 1000 units tablet Take 1,000 Units by mouth 2 (two) times daily.  . citalopram (CELEXA) 20 MG tablet Take 1 tablet (20 mg total) by mouth daily.  . furosemide (LASIX) 20 MG tablet Take 20 mg by mouth daily.  Marland Kitchen glucose blood test strip 1 strip by Does not apply route 3 (three) times daily. Uses true metrix strips (diabetic club)  . glucose blood test strip DIABETIC CLUB  . insulin detemir (LEVEMIR) 100 UNIT/ML injection Inject 0.47-0.48 mLs (47-48 Units total) into the skin  2 (two) times daily.  . Insulin Pen Needle (B-D ULTRAFINE III SHORT PEN) 31G X 8 MM MISC Use to inject Victoza qd  . levothyroxine (SYNTHROID, LEVOTHROID) 200 MCG tablet Take 1 tablet (200 mcg total) by mouth daily before breakfast.  . liraglutide 18 MG/3ML SOPN Inject 0.3 mLs (1.8 mg total) into the skin daily.  . metFORMIN (GLUCOPHAGE) 1000 MG tablet TAKE 1 TABLET BY MOUTH TWICE DAILY WITH A MEAL.  . Multiple Vitamins-Minerals (MENS MULTI VITAMIN & MINERAL PO) Take 1 tablet by mouth daily.    . Omega-3 Fatty Acids (FISH OIL) 1200 MG CAPS Take 1,200-2,400 mg by mouth 2 (two) times daily. Take 1 capsule (1200 mg) by mouth daily at noon and 2 capsules (2400 mg) daily at bedtime  . simvastatin (ZOCOR) 80 MG tablet Take 40 mg by mouth at bedtime.   . tamsulosin (FLOMAX) 0.4 MG CAPS capsule Take 0.4 mg by mouth at bedtime.   No facility-administered encounter medications on file as of 08/10/2016.       Review of Systems  Constitutional: Negative.   HENT: Negative.   Respiratory: Negative.   Cardiovascular: Negative.   Gastrointestinal: Negative.   Neurological: Positive for dizziness.  Psychiatric/Behavioral: Negative.        Objective:   Physical Exam  Constitutional: He is oriented to person, place, and time. He appears well-developed and well-nourished.  HENT:  Left tympanic membrane is dull without normal landmarks  Eyes: Pupils are equal, round, and reactive to light.  There is no nystagmus  Cardiovascular: Normal rate, regular rhythm and normal heart sounds.   Pulmonary/Chest: Effort normal.  Neurological: He is alert and oriented to person, place, and time. No cranial nerve deficit. Coordination normal.  Psychiatric: He has a normal mood and affect.   BP 124/61   Pulse 86   Temp (!) 97.2 F (36.2 C) (Oral)   Ht 6' (1.829 m)   Wt 267 lb 9.6 oz (121.4 kg)   BMI 36.29 kg/m         Assessment & Plan:  Dizziness. Could well be related to vascular disease in his neck.  There is no evidence to suggest Mnire's syndrome. We will check electrolytes and hemoglobin to see if there might be a correctable cause there. He also had a urine specimen today showed some pyuria. Plan to culture. Start meclizine for symptomatic relief of dizziness.

## 2016-08-11 ENCOUNTER — Ambulatory Visit: Payer: Medicare Other | Admitting: Family

## 2016-08-11 LAB — CBC WITH DIFFERENTIAL/PLATELET
BASOS: 1 %
Basophils Absolute: 0 10*3/uL (ref 0.0–0.2)
EOS (ABSOLUTE): 0.3 10*3/uL (ref 0.0–0.4)
EOS: 4 %
HEMOGLOBIN: 13.7 g/dL (ref 13.0–17.7)
Hematocrit: 41.7 % (ref 37.5–51.0)
IMMATURE GRANS (ABS): 0 10*3/uL (ref 0.0–0.1)
Immature Granulocytes: 0 %
LYMPHS ABS: 1.8 10*3/uL (ref 0.7–3.1)
LYMPHS: 26 %
MCH: 32.1 pg (ref 26.6–33.0)
MCHC: 32.9 g/dL (ref 31.5–35.7)
MCV: 98 fL — AB (ref 79–97)
MONOCYTES: 9 %
Monocytes Absolute: 0.6 10*3/uL (ref 0.1–0.9)
NEUTROS ABS: 4.2 10*3/uL (ref 1.4–7.0)
Neutrophils: 60 %
Platelets: 165 10*3/uL (ref 150–379)
RBC: 4.27 x10E6/uL (ref 4.14–5.80)
RDW: 15.7 % — ABNORMAL HIGH (ref 12.3–15.4)
WBC: 6.9 10*3/uL (ref 3.4–10.8)

## 2016-08-11 LAB — BMP8+EGFR
BUN / CREAT RATIO: 20 (ref 10–24)
BUN: 24 mg/dL (ref 8–27)
CALCIUM: 9.5 mg/dL (ref 8.6–10.2)
CHLORIDE: 99 mmol/L (ref 96–106)
CO2: 22 mmol/L (ref 20–29)
CREATININE: 1.19 mg/dL (ref 0.76–1.27)
GFR, EST AFRICAN AMERICAN: 70 mL/min/{1.73_m2} (ref >59)
GFR, EST NON AFRICAN AMERICAN: 61 mL/min/{1.73_m2} (ref >59)
Glucose: 213 mg/dL — ABNORMAL HIGH (ref 65–99)
Potassium: 4.9 mmol/L (ref 3.5–5.2)
Sodium: 138 mmol/L (ref 134–144)

## 2016-08-11 LAB — URINE CULTURE

## 2016-08-11 NOTE — Progress Notes (Signed)
Patient aware.

## 2016-08-18 ENCOUNTER — Other Ambulatory Visit: Payer: Self-pay | Admitting: Cardiology

## 2016-08-18 NOTE — Progress Notes (Signed)
Opened in error

## 2016-09-05 DIAGNOSIS — E113493 Type 2 diabetes mellitus with severe nonproliferative diabetic retinopathy without macular edema, bilateral: Secondary | ICD-10-CM | POA: Diagnosis not present

## 2016-09-05 DIAGNOSIS — H3563 Retinal hemorrhage, bilateral: Secondary | ICD-10-CM | POA: Diagnosis not present

## 2016-09-05 DIAGNOSIS — H35043 Retinal micro-aneurysms, unspecified, bilateral: Secondary | ICD-10-CM | POA: Diagnosis not present

## 2016-09-05 DIAGNOSIS — H43811 Vitreous degeneration, right eye: Secondary | ICD-10-CM | POA: Diagnosis not present

## 2016-09-09 ENCOUNTER — Encounter: Payer: Self-pay | Admitting: *Deleted

## 2016-09-12 ENCOUNTER — Ambulatory Visit (INDEPENDENT_AMBULATORY_CARE_PROVIDER_SITE_OTHER): Payer: Medicare Other | Admitting: Internal Medicine

## 2016-09-12 ENCOUNTER — Encounter: Payer: Self-pay | Admitting: Internal Medicine

## 2016-09-12 VITALS — BP 116/70 | HR 82 | Ht 73.0 in | Wt 267.0 lb

## 2016-09-12 DIAGNOSIS — I483 Typical atrial flutter: Secondary | ICD-10-CM

## 2016-09-12 DIAGNOSIS — I442 Atrioventricular block, complete: Secondary | ICD-10-CM

## 2016-09-12 LAB — CUP PACEART INCLINIC DEVICE CHECK
Battery Voltage: 3.17 V
Brady Statistic AP VP Percent: 0.71 %
Brady Statistic AS VS Percent: 0.29 %
Brady Statistic RA Percent Paced: 0.71 %
Brady Statistic RV Percent Paced: 99.63 %
Date Time Interrogation Session: 20180820093008
Implantable Lead Implant Date: 20180521
Implantable Lead Location: 753859
Implantable Lead Model: 3830
Implantable Pulse Generator Implant Date: 20180521
Lead Channel Impedance Value: 456 Ohm
Lead Channel Pacing Threshold Amplitude: 0.75 V
Lead Channel Pacing Threshold Amplitude: 1 V
Lead Channel Pacing Threshold Pulse Width: 0.4 ms
Lead Channel Sensing Intrinsic Amplitude: 1.625 mV
Lead Channel Setting Pacing Amplitude: 2.25 V
Lead Channel Setting Pacing Pulse Width: 0.4 ms
MDC IDC LEAD IMPLANT DT: 20180521
MDC IDC LEAD LOCATION: 753859
MDC IDC MSMT BATTERY REMAINING LONGEVITY: 133 mo
MDC IDC MSMT LEADCHNL RA IMPEDANCE VALUE: 323 Ohm
MDC IDC MSMT LEADCHNL RA IMPEDANCE VALUE: 532 Ohm
MDC IDC MSMT LEADCHNL RV IMPEDANCE VALUE: 342 Ohm
MDC IDC MSMT LEADCHNL RV PACING THRESHOLD PULSEWIDTH: 0.4 ms
MDC IDC MSMT LEADCHNL RV SENSING INTR AMPL: 18.5 mV
MDC IDC SET LEADCHNL RV PACING AMPLITUDE: 2.5 V
MDC IDC SET LEADCHNL RV SENSING SENSITIVITY: 2 mV
MDC IDC STAT BRADY AP VS PERCENT: 0.01 %
MDC IDC STAT BRADY AS VP PERCENT: 98.99 %

## 2016-09-12 NOTE — Patient Instructions (Signed)
Medication Instructions:  Your physician recommends that you continue on your current medications as directed. Please refer to the Current Medication list given to you today.   Labwork: NONE  Testing/Procedures: NONE  Follow-Up: Your physician wants you to follow-up in: 9 Months. You will receive a reminder letter in the mail two months in advance. If you don't receive a letter, please call our office to schedule the follow-up appointment. Remote monitoring is used to monitor your Pacemaker of ICD from home. This monitoring reduces the number of office visits required to check your device to one time per year. It allows [name] to keep an eye on the functioning of your device to ensure it is working properly.   You are scheduled for a device check from home on 12/12/16. You may send your transmission at any time that day. If you have a wireless device, the transmission will be sent automatically. After your physician reviews your transmission, you will receive a postcard with your next transmission date.    Any Other Special Instructions Will Be Listed Below (If Applicable).     If you need a refill on your cardiac medications before your next appointment, please call your pharmacy. Thank you for choosing [facility]!

## 2016-09-12 NOTE — Progress Notes (Signed)
HPI [name] returns today for followup. He is a pleasant 71 yo man with CHB, s/p PPM insertion, PAF, h/o AVR and syncope who underwent PPM insertion about 3 months ago. He still gets a little dizzy if he gets up quickly but has had no syncope. Dyspnea is improved.  Allergies  Allergen Reactions  . Phenothiazines Anaphylaxis  . Actos [Pioglitazone] Swelling     Current Outpatient Prescriptions  Medication Sig Dispense Refill  . acetaminophen (TYLENOL) 500 MG tablet Take 1,000 mg by mouth every 6 (six) hours as needed (pain).    Marland Kitchen aspirin 81 MG chewable tablet Chew 1 tablet (81 mg total) by mouth daily.    . cholecalciferol (VITAMIN D) 1000 units tablet Take 1,000 Units by mouth 2 (two) times daily.    . citalopram (CELEXA) 20 MG tablet Take 1 tablet (20 mg total) by mouth daily. 30 tablet 0  . furosemide (LASIX) 20 MG tablet Take 20 mg by mouth daily.    Marland Kitchen glucose blood test strip 1 strip by Does not apply route 3 (three) times daily. Uses true metrix strips (diabetic club)  12  . glucose blood test strip DIABETIC CLUB  12  . insulin detemir (LEVEMIR) 100 UNIT/ML injection Inject 0.47-0.48 mLs (47-48 Units total) into the skin 2 (two) times daily. 10 mL   . Insulin Pen Needle (B-D ULTRAFINE III SHORT PEN) 31G X 8 MM MISC Use to inject Victoza qd 100 each 5  . levothyroxine (SYNTHROID, LEVOTHROID) 200 MCG tablet Take 1 tablet (200 mcg total) by mouth daily before breakfast. 90 tablet 3  . liraglutide 18 MG/3ML SOPN Inject 0.3 mLs (1.8 mg total) into the skin daily. 9 pen 1  . metFORMIN (GLUCOPHAGE) 1000 MG tablet TAKE 1 TABLET BY MOUTH TWICE DAILY WITH A MEAL. 180 tablet 0  . Multiple Vitamins-Minerals (MENS MULTI VITAMIN & MINERAL PO) Take 1 tablet by mouth daily.      . Omega-3 Fatty Acids (FISH OIL) 1200 MG CAPS Take 1,200-2,400 mg by mouth 2 (two) times daily. Take 1 capsule (1200 mg) by mouth daily at noon and 2 capsules (2400 mg) daily at bedtime    . simvastatin (ZOCOR) 80  MG tablet Take 40 mg by mouth at bedtime.     . tamsulosin (FLOMAX) 0.4 MG CAPS capsule Take 0.4 mg by mouth at bedtime.     No current facility-administered medications for this visit.      Past Medical History:  Diagnosis Date  . Arthritis   . BPH (benign prostatic hypertrophy) 04/15/2010  . Cataract   . Colon polyps   . DEPRESSION 01/22/2009  . Heart valve replaced   . HYPERCHOLESTEROLEMIA 01/22/2009  . HYPERTENSION 01/22/2009   Dr. [name]  . 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 Morbid obesity ([place])   . Tremors of nervous system    ?ptsd  . Ulcer     ROS:   All systems reviewed and negative except as noted in the HPI.   Past Surgical History:  Procedure Laterality Date  . 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 APPENDECTOMY    . bilateral cataract surg    . COLONOSCOPY  04/24/2007   [name]: normal  . COLONOSCOPY WITH ESOPHAGOGASTRODUODENOSCOPY (EGD) N/A 04/05/2012   Procedure: COLONOSCOPY WITH ESOPHAGOGASTRODUODENOSCOPY (EGD);  Surgeon: Daneil Dolin, MD;  [facility];  Service: Endoscopy;  Laterality: N/A;  10;15  . DENTAL  SURGERY  05/2004   Dental extractions  . EYE SURGERY Bilateral    cataract  . HEEL SPUR SURGERY Bilateral    resection of heel spur  . KNEE ARTHROSCOPY WITH LATERAL MENISECTOMY Right 04/04/2014   Procedure: KNEE ARTHROSCOPY WITH LATERAL MENISECTOMY;  Surgeon: Carole Civil, MD;  Location: [facility];  Service: Orthopedics;  Laterality: Right;  . PACEMAKER IMPLANT N/A 06/13/2016   Procedure: Pacemaker Implant- Dual Chamber;  Surgeon: Evans Lance, MD;  Location: [facility];  Service: Cardiovascular;  Laterality: N/A;  . PACEMAKER INSERTION  2018  . THYROIDECTOMY  03/21/2013   DR [name]  . THYROIDECTOMY N/A 03/21/2013   Procedure: THYROIDECTOMY;  Surgeon: [name], MD;  Location: [facility];  Service: General;  Laterality: N/A;  . TONSILLECTOMY        Family History  Problem Relation Age of Onset  . Heart disease Father   . Congestive Heart Failure Father   . Arthritis Father   . Diabetes Unknown   . Benign prostatic hyperplasia Brother      Social History   Social History  . Marital status: Single    Spouse name: N/A  . Number of children: 1  . Years of education: HS   Occupational History  . Truck Geophysicist/field seismologist, retired Retired   Social History Main Topics  . Smoking status: Former Smoker    Packs/day: 0.00    Years: 12.00    Types: Cigarettes    Quit date: 09/20/1961  . Smokeless tobacco: Never Used  . Alcohol use No  . Drug use: No  . Sexual activity: No     Comment: Has not smoked in 20 years   Other Topics Concern  . Not on file   Social History Narrative   Lives alone.   Quit smoking approximately 28 years ago.   Long haul truck driver, lives in [place].     BP 116/70   Pulse 82   Ht 6\' 1"  (1.854 m)   Wt 267 lb (121.1 kg)   SpO2 96%   BMI 35.23 kg/m   Physical Exam:  obese appearing 71 yo man, NAD HEENT: Unremarkable Neck:  8 cm JVD, no thyromegally Lymphatics:  No adenopathy Back:  No CVA tenderness Lungs:  Clear except for basilar rales HEART:  Regular rate rhythm, no murmurs, no rubs, no clicks Abd:  soft, positive bowel sounds, no organomegally, no rebound, no guarding Ext:  2 plus pulses, no edema, no cyanosis, no clubbing Skin:  No rashes no nodules Neuro:  CN II through XII intact, motor grossly intact  EKG - none today  DEVICE  Normal device function.  See PaceArt for details. Underlying rhythm is atrial fib/flutter  Assess/Plan: 1. Persistent atrial fib/flutter - He is asymptomatic and he rate is controlled. He is on ASA. I have recommended systemic anticoagulation with either coumadin or an [place] and he refuses. He understands that he is at risk for stroke by not taking a blood thinner.  2. Chronic diastolic heart failure - his symptoms are class 2. He admits to some sodium  indiscretion and I have asked him to try and lose weight and eat less salt. 3. HTN - his blood pressure today is well controlled. No change in meds.  4. Obesity - I encouraged him to lose weight. He is asked to increase his activity and eat less.  Mikle Bosworth.D.

## 2016-09-27 ENCOUNTER — Telehealth: Payer: Self-pay | Admitting: Family Medicine

## 2016-09-27 ENCOUNTER — Ambulatory Visit (INDEPENDENT_AMBULATORY_CARE_PROVIDER_SITE_OTHER): Payer: Medicare Other | Admitting: Family Medicine

## 2016-09-27 ENCOUNTER — Encounter: Payer: Self-pay | Admitting: Family Medicine

## 2016-09-27 VITALS — BP 130/69 | HR 85 | Temp 97.5°F | Ht 73.0 in | Wt 265.6 lb

## 2016-09-27 DIAGNOSIS — I10 Essential (primary) hypertension: Secondary | ICD-10-CM | POA: Diagnosis not present

## 2016-09-27 DIAGNOSIS — E119 Type 2 diabetes mellitus without complications: Secondary | ICD-10-CM | POA: Diagnosis not present

## 2016-09-27 DIAGNOSIS — I481 Persistent atrial fibrillation: Secondary | ICD-10-CM | POA: Diagnosis not present

## 2016-09-27 DIAGNOSIS — I4891 Unspecified atrial fibrillation: Secondary | ICD-10-CM | POA: Insufficient documentation

## 2016-09-27 DIAGNOSIS — I4819 Other persistent atrial fibrillation: Secondary | ICD-10-CM

## 2016-09-27 MED ORDER — APIXABAN 5 MG PO TABS: 5.0000 mg | ORAL_TABLET | Freq: Two times a day (BID) | ORAL | 5 refills | Status: DC

## 2016-09-27 NOTE — Progress Notes (Signed)
   HPI  Patient presents today for follow-up of chronic medical.  Diabetes Very good medication compliance Using 47 units of Levemir daily. Patient reports one episode of hypoglycemia this was last night, his blood sugars 155 before he ate, he took his levemir and went down to CBG 45. He did experience symptoms with this including sweatiness and shaking feelings of weakness. He recovered with sugar. This is the first time.  Atrial fibrillation Patient has been considering blood thinners, his electrophysiologist recommended these. Denies any racing heart Denies any frequent falls or GI bleeding.  Hypertension No medications, not checking at home  PMH: Smoking status noted ROS: Per HPI  Objective: BP 130/69   Pulse 85   Temp (!) 97.5 F (36.4 C) (Oral)   Ht 6\' 1"  (1.854 m)   Wt 265 lb 9.6 oz (120.5 kg)   BMI 35.04 kg/m  Gen: NAD, alert, cooperative with exam HEENT: NCAT CV: irreg irreg, rate normal Resp: CTABL, no wheezes, non-labored Ext: No edema, warm Neuro: Alert and oriented, No gross deficits  Assessment and plan:  # Type 2 diabetes Patient is too early for A1c, however it sounds like his control has improved I have reduced his Levemir dose to 42 units based on his episode of hypoglycemia, I would like to change him to tresiba, but given difficulty with medication coverage we will leave this alone.  # Atrial fibrillation Discussed with patient starting blood thinners today, discussed reasons to seek emergency medical care and risks and benefits of treating with anticoagulation Starting eliquis today RTC in 3-4 weeks for repeat labs including A1C.   # HTN Diet controlled, no meds ( lasix can have some effect) No changes   Meds ordered this encounter  Medications  . apixaban (ELIQUIS) 5 MG TABS tablet    Sig: Take 1 tablet (5 mg total) by mouth 2 (two) times daily.    Dispense:  60 tablet    Refill:  [place], MD [facility] 09/27/2016, 9:40 AM

## 2016-09-27 NOTE — Patient Instructions (Signed)
Great to see you!  Start eliquis 1 pill twice daily and come back in 1 month for recheck.   Cut back to 42 units once daily for levemir.   We will do your labs in 1 month

## 2016-09-28 MED ORDER — APIXABAN 5 MG PO TABS: 5.0000 mg | ORAL_TABLET | Freq: Two times a day (BID) | ORAL | 3 refills | Status: DC

## 2016-09-28 NOTE — Telephone Encounter (Signed)
Patient aware. Faxed sent.

## 2016-09-28 NOTE — Telephone Encounter (Signed)
Pt had Eliquis filled yesterday at [facility] but would like written Rx for remaining refills faxed to the [facility] ([place]) at [phone] to be filled there.

## 2016-09-28 NOTE — Telephone Encounter (Signed)
Rx printed to send to [place], MD [facility] 09/28/2016, 12:05 PM

## 2016-10-03 ENCOUNTER — Telehealth: Payer: Self-pay | Admitting: Family Medicine

## 2016-10-03 NOTE — Telephone Encounter (Signed)
Pt aware - cups up front.

## 2016-10-06 ENCOUNTER — Other Ambulatory Visit: Payer: Self-pay | Admitting: Family Medicine

## 2016-10-07 ENCOUNTER — Ambulatory Visit: Payer: Medicare Other | Admitting: Urology

## 2016-10-07 ENCOUNTER — Encounter: Payer: Self-pay | Admitting: Emergency Medicine

## 2016-10-10 ENCOUNTER — Encounter: Payer: Self-pay | Admitting: Family Medicine

## 2016-10-10 ENCOUNTER — Ambulatory Visit (INDEPENDENT_AMBULATORY_CARE_PROVIDER_SITE_OTHER): Payer: Medicare Other | Admitting: Family Medicine

## 2016-10-10 VITALS — BP 114/62 | HR 83 | Temp 97.4°F | Ht 73.0 in | Wt 262.8 lb

## 2016-10-10 DIAGNOSIS — L570 Actinic keratosis: Secondary | ICD-10-CM | POA: Diagnosis not present

## 2016-10-10 DIAGNOSIS — R0982 Postnasal drip: Secondary | ICD-10-CM

## 2016-10-10 MED ORDER — FLUTICASONE PROPIONATE 50 MCG/ACT NA SUSP: 2.0000 | Freq: Every day | NASAL | 6 refills | Status: DC

## 2016-10-10 NOTE — Patient Instructions (Signed)
Great to see you!  Come back or call with any concerns  It is ok to take tylenol with your blood thinner.   Try flonase for your nasal congestion and cough  I would also recommend seeing a dermatologist ( skin doctor) at the [facility] if possible.

## 2016-10-10 NOTE — Progress Notes (Signed)
   HPI  Patient presents today here with cough and congestion.  Patient explains he's had about 2 weeks of nasal congestion and cough. He denies shortness of breath, fever, chills, sweats.  He is not taking any over-the-counter medications. He is unsure if he can take Tylenol.  He requests cortisone injection, however his blood sugars have been uncontrolled lately as he is temporarily out of Victoza. [facility]  PMH: Smoking status noted ROS: Per HPI  Objective: BP 114/62   Pulse 83   Temp (!) 97.4 F (36.3 C) (Oral)   Ht 6\' 1"  (1.854 m)   Wt 262 lb 12.8 oz (119.2 kg)   BMI 34.67 kg/m  Gen: NAD, alert, cooperative with exam HEENT: NCAT, EOMI, PERRL CV: RRR, good S1/S2, no murmur Resp: CTABL, no wheezes, non-labored Ext: No edema, warm Neuro: Alert and oriented, No gross deficits  Skin:  2 areas on the right year along the superior edge that are hyperkeratotic, approximately 1 mm x 3 mm each, one similar lesion on the left ear, consistent with actinic keratosis  Assessment and plan:  # Postnasaldrip Symptoms was consistent with postnasal drip and frequent throat clearing, upper airway cough Lung exam is benign, patient appears well. Recommended Flonase Low threshold for return if symptoms worsen   # Actinic keratosis 3 areas of concern on bilateral ears, recommended seeing a dermatologist at the [facility], I also offered a referral, I also offered cryotherapy today which she would like to wait on.    Meds ordered this encounter  Medications  . fluticasone (FLONASE) 50 MCG/ACT nasal spray    Sig: Place 2 sprays into both nostrils daily.    Dispense:  16 g    Refill:  [place], MD [facility] 10/10/2016, 11:33 AM

## 2016-10-17 ENCOUNTER — Telehealth: Payer: Self-pay | Admitting: Family Medicine

## 2016-10-17 NOTE — Telephone Encounter (Signed)
Ok with Rx, please print and fax.   5 mg BID, #180, R3 ok.   Laroy Apple, MD [facility] 10/17/2016, 5:24 PM

## 2016-10-17 NOTE — Telephone Encounter (Signed)
lmtcb

## 2016-10-17 NOTE — Telephone Encounter (Signed)
Patient is wanting a Rx of his Eliquis sent to the [facility]. Was last filled 9/5 #180 R3. Patient states he does not want to use [facility] since he can get it free at [facility]. If approved fax to:  [phone] Atten- Dr. Modena Nunnery

## 2016-10-18 MED ORDER — APIXABAN 5 MG PO TABS: 5.0000 mg | ORAL_TABLET | Freq: Two times a day (BID) | ORAL | 3 refills | Status: DC

## 2016-10-18 NOTE — Addendum Note (Signed)
Addended by: Karle Plumber on: 10/18/2016 03:27 PM   Modules accepted: Orders

## 2016-10-18 NOTE — Telephone Encounter (Signed)
Faxed. Patient aware.

## 2016-10-21 ENCOUNTER — Encounter: Payer: Self-pay | Admitting: Cardiology

## 2016-11-01 NOTE — Progress Notes (Signed)
HPI The patient presents for followup of aortic valve replacement.  Since I last saw him he had symptomatic bradycardia and he had dual chamber pacemaker placed.  Since then he has felt well.  The patient denies any new symptoms such as chest discomfort, neck or arm discomfort. There has been no new shortness of breath, PND or orthopnea. There have been no reported palpitations, presyncope or syncope.  He feels like "I could run 500 miles."     Allergies  Allergen Reactions  . Phenothiazines Anaphylaxis  . Actos [Pioglitazone] Swelling    Current Outpatient Prescriptions  Medication Sig Dispense Refill  . acetaminophen (TYLENOL) 500 MG tablet Take 1,000 mg by mouth every 6 (six) hours as needed (pain).    Marland Kitchen apixaban (ELIQUIS) 5 MG TABS tablet Take 1 tablet (5 mg total) by mouth 2 (two) times daily. 180 tablet 3  . cholecalciferol (VITAMIN D) 1000 units tablet Take 1,000 Units by mouth 2 (two) times daily.    . citalopram (CELEXA) 20 MG tablet Take 1 tablet (20 mg total) by mouth daily. 30 tablet 0  . fluticasone (FLONASE) 50 MCG/ACT nasal spray Place 2 sprays into both nostrils daily. 16 g 6  . furosemide (LASIX) 20 MG tablet Take 20 mg by mouth daily.    Marland Kitchen glucose blood test strip 1 strip by Does not apply route 3 (three) times daily. Uses true metrix strips (diabetic club)  12  . glucose blood test strip DIABETIC CLUB  12  . insulin detemir (LEVEMIR) 100 UNIT/ML injection Inject 0.47-0.48 mLs (47-48 Units total) into the skin 2 (two) times daily. 10 mL   . Insulin Pen Needle (B-D ULTRAFINE III SHORT PEN) 31G X 8 MM MISC Use to inject Victoza qd 100 each 5  . levothyroxine (SYNTHROID, LEVOTHROID) 200 MCG tablet Take 1 tablet (200 mcg total) by mouth daily before breakfast. 90 tablet 3  . metFORMIN (GLUCOPHAGE) 1000 MG tablet TAKE 1 TABLET BY MOUTH TWICE DAILY WITH A MEAL. 180 tablet 0  . Multiple Vitamins-Minerals (MENS MULTI VITAMIN & MINERAL PO) Take 1 tablet by mouth daily.      .  Omega-3 Fatty Acids (FISH OIL) 1200 MG CAPS Take 1,200-2,400 mg by mouth 2 (two) times daily. Take 1 capsule (1200 mg) by mouth daily at noon and 2 capsules (2400 mg) daily at bedtime    . simvastatin (ZOCOR) 80 MG tablet Take 40 mg by mouth at bedtime.     . tamsulosin (FLOMAX) 0.4 MG CAPS capsule Take 0.4 mg by mouth at bedtime.    Marland Kitchen VICTOZA 18 MG/3ML SOPN INJECT 1.2 MG INTO THE SKIN DAILY. START WITH 0.6 MG DAILY FOR 1 WEEK THEN INCREASE TO 1.2 MG 6 mL 2   No current facility-administered medications for this visit.     Past Medical History:  Diagnosis Date  . Arthritis   . BPH (benign prostatic hypertrophy) 04/15/2010  . Cataract   . Colon polyps   . DEPRESSION 01/22/2009  . Heart valve replaced   . HYPERCHOLESTEROLEMIA 01/22/2009  . HYPERTENSION 01/22/2009   Dr. [name]  . 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 Morbid obesity ([place])   . Tremors of nervous system    ?ptsd  . Ulcer     Past Surgical History:  Procedure Laterality Date  . 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 APPENDECTOMY    . bilateral cataract surg    . COLONOSCOPY  04/24/2007   [name]: normal  . COLONOSCOPY WITH ESOPHAGOGASTRODUODENOSCOPY (EGD) N/A 04/05/2012   Procedure: COLONOSCOPY WITH ESOPHAGOGASTRODUODENOSCOPY (EGD);  Surgeon: Daneil Dolin, MD;  [facility];  Service: Endoscopy;  Laterality: N/A;  10;15  . DENTAL SURGERY  05/2004   Dental extractions  . EYE SURGERY Bilateral    cataract  . HEEL SPUR SURGERY Bilateral    resection of heel spur  . KNEE ARTHROSCOPY WITH LATERAL MENISECTOMY Right 04/04/2014   Procedure: KNEE ARTHROSCOPY WITH LATERAL MENISECTOMY;  Surgeon: Carole Civil, MD;  Location: [facility];  Service: Orthopedics;  Laterality: Right;  . PACEMAKER IMPLANT N/A 06/13/2016   Procedure: Pacemaker Implant- Dual Chamber;  Surgeon: Evans Lance, MD;  Location: [facility];  Service: Cardiovascular;   Laterality: N/A;  . PACEMAKER INSERTION  2018  . THYROIDECTOMY  03/21/2013   DR [name]  . THYROIDECTOMY N/A 03/21/2013   Procedure: THYROIDECTOMY;  Surgeon: [name], MD;  Location: [facility];  Service: General;  Laterality: N/A;  . TONSILLECTOMY     ROS:   As stated in the HPI and negative for all other systems.  PHYSICAL EXAM BP 132/68   Pulse 88   Ht 6\' 1"  (1.854 m)   Wt 264 lb (119.7 kg)   SpO2 96%   BMI 34.83 kg/m   GENERAL:  Well appearing NECK:  No jugular venous distention, waveform within normal limits, carotid upstroke brisk and symmetric, no bruits, no thyromegaly LUNGS:  Clear to auscultation bilaterally CHEST:  Well healed pacemaker pocket, Well healed sternotomy scar. HEART:  PMI not displaced or sustained,S1 and S2 within normal limits, no S3, no S4, no clicks, no rubs, 3 out of 6 apical systolic murmur radiating slightly out the aortic outflow tract, no diastolic murmurs ABD:  Flat, positive bowel sounds normal in frequency in pitch, no bruits, no rebound, no guarding, no midline pulsatile mass, no hepatomegaly, no splenomegaly EXT:  2 plus pulses throughout, no edema, no cyanosis no clubbing     Lab Results  Component Value Date   HGBA1C 7.3 03/25/2015   Lab Results  Component Value Date   CHOL 118 06/12/2016   TRIG 140 06/12/2016   HDL 39 (L) 06/12/2016   LDLCALC 51 06/12/2016   EKG:  NA  ASSESSMENT AND PLAN  DYSPNEA:   This is much improved.  No change in therapy.  CHRONIC SYSTOLIC AND DIASTOLIC HF:   He seems to be euvolemic.  No change in therapy or further imaging will be done at this time.   AORTIC VALVE REPLACEMENT, HX OF - He has stable.  No further imaging is needed at this time.    HYPERTENSION -  The blood pressure is at target. No change in medications is indicated. We will continue with therapeutic lifestyle changes (TLC).  CAROTID OCCLUSIVE DISEASE -  Carotid Doppler in July of last year demonstrated only mild plaque.  He is to  have follow up of this next year.    PPM - He is up to date with follow up.   ATRIAL FIB - He has been in this intermittently.  He is tolerating anticoagulation.

## 2016-11-02 ENCOUNTER — Ambulatory Visit (INDEPENDENT_AMBULATORY_CARE_PROVIDER_SITE_OTHER): Payer: Medicare Other | Admitting: Cardiology

## 2016-11-02 ENCOUNTER — Encounter: Payer: Self-pay | Admitting: Cardiology

## 2016-11-02 VITALS — BP 132/68 | HR 88 | Ht 73.0 in | Wt 264.0 lb

## 2016-11-02 DIAGNOSIS — I480 Paroxysmal atrial fibrillation: Secondary | ICD-10-CM

## 2016-11-02 DIAGNOSIS — Z952 Presence of prosthetic heart valve: Secondary | ICD-10-CM | POA: Diagnosis not present

## 2016-11-02 DIAGNOSIS — I10 Essential (primary) hypertension: Secondary | ICD-10-CM

## 2016-11-02 NOTE — Patient Instructions (Signed)
Medication Instructions: Your physician recommends that you continue on your current medications as directed. Please refer to the Current Medication list given to you today. Labwork: None Ordered  Procedures/Testing: None Ordered  Follow-Up: Your physician wants you to follow-up in: 6 MONTHS with Dr. [name] in [place]. You will receive a reminder letter in the mail two months in advance. If you don't receive a letter, please call our office to schedule the follow-up appointment.   Any Additional Special Instructions Will Be Listed Below (If Applicable).     If you need a refill on your cardiac medications before your next appointment, please call your pharmacy.

## 2016-11-03 ENCOUNTER — Ambulatory Visit: Payer: Medicare Other | Admitting: Family

## 2016-11-04 ENCOUNTER — Ambulatory Visit (INDEPENDENT_AMBULATORY_CARE_PROVIDER_SITE_OTHER): Payer: Medicare Other | Admitting: Family Medicine

## 2016-11-04 ENCOUNTER — Encounter: Payer: Self-pay | Admitting: Family Medicine

## 2016-11-04 VITALS — BP 154/73 | HR 88 | Temp 97.0°F | Ht 73.0 in | Wt 264.0 lb

## 2016-11-04 DIAGNOSIS — N3000 Acute cystitis without hematuria: Secondary | ICD-10-CM | POA: Diagnosis not present

## 2016-11-04 DIAGNOSIS — Z952 Presence of prosthetic heart valve: Secondary | ICD-10-CM | POA: Insufficient documentation

## 2016-11-04 DIAGNOSIS — R399 Unspecified symptoms and signs involving the genitourinary system: Secondary | ICD-10-CM | POA: Diagnosis not present

## 2016-11-04 LAB — URINALYSIS, COMPLETE
BILIRUBIN UA: NEGATIVE
KETONES UA: NEGATIVE
NITRITE UA: NEGATIVE
PROTEIN UA: NEGATIVE
RBC UA: NEGATIVE
Specific Gravity, UA: 1.015 (ref 1.005–1.030)
Urobilinogen, Ur: 0.2 mg/dL (ref 0.2–1.0)
pH, UA: 8 — ABNORMAL HIGH (ref 5.0–7.5)

## 2016-11-04 LAB — MICROSCOPIC EXAMINATION: Renal Epithel, UA: NONE SEEN /hpf

## 2016-11-04 MED ORDER — CIPROFLOXACIN HCL 500 MG PO TABS: 500.0000 mg | ORAL_TABLET | Freq: Two times a day (BID) | ORAL | 0 refills | Status: DC

## 2016-11-04 NOTE — Patient Instructions (Signed)
Your urine does show bacteria and white blood cells. I have called in a prescription for you to take twice a day for the next 5 days. I do recommend that you call your urologist at the [facility] in [place] and schedule an appointment as soon as possible for follow-up.      Urinary Tract Infection, Adult A urinary tract infection (UTI) is an infection of any part of the urinary tract. The urinary tract includes the:  Kidneys.  Ureters.  Bladder.  Urethra.  These organs make, store, and get rid of pee (urine) in the body. Follow these instructions at home:  Take over-the-counter and prescription medicines only as told by your doctor.  If you were prescribed an antibiotic medicine, take it as told by your doctor. Do not stop taking the antibiotic even if you start to feel better.  Avoid the following drinks: ? Alcohol. ? Caffeine. ? Tea. ? Carbonated drinks.  Drink enough fluid to keep your pee clear or pale yellow.  Keep all follow-up visits as told by your doctor. This is important.  Make sure to: ? Empty your bladder often and completely. Do not to hold pee for long periods of time. ? Empty your bladder before and after sex. ? Wipe from front to back after a bowel movement if you are male. Use each tissue one time when you wipe. Contact a doctor if:  You have back pain.  You have a fever.  You feel sick to your stomach (nauseous).  You throw up (vomit).  Your symptoms do not get better after 3 days.  Your symptoms go away and then come back. Get help right away if:  You have very bad back pain.  You have very bad lower belly (abdominal) pain.  You are throwing up and cannot keep down any medicines or water. This information is not intended to replace advice given to you by your health care provider. Make sure you discuss any questions you have with your health care provider. Document Released: 06/29/2007 Document Revised: 06/18/2015 Document Reviewed:  12/01/2014 Elsevier Interactive Patient Education  [employer].

## 2016-11-04 NOTE — Progress Notes (Signed)
Subjective: CC:UTI PCP: Timmothy Euler, MD [id number] [name] is a 71 y.o. male presenting to clinic today for:  1. Urinary symptoms Patient with a history of urinary frequency. On his last urinalysis, pyuria was noted and therefore culture was sent. Urine culture showed mixed urogenital flora with insignificant growth. He was referred to urology, Dr. Jeffie Pollock, for further evaluation. He notes that he has had one week of pain with urination. He notes that it actually has gotten slightly better for last couple days but is still not base line. He reports a history of UTI several years ago. He did have some similar symptoms earlier this year. He was referred to a urologist. He notes that he is actually seeing a urologist at the [facility] in [place] and there is a planned urologic surgery in December. He thinks he may have a stricture of some sort as the urologist was unable to pass a cystoscope through the urethra normally. He denies any hematuria, fevers, chills, nausea, vomiting, abdominal pain or penile discharge. No new back pain. He does report a distant history of a renal stone that passed on its own.  Allergies  Allergen Reactions  . Phenothiazines Anaphylaxis  . Actos [Pioglitazone] Swelling   Past Medical History:  Diagnosis Date  . Arthritis   . BPH (benign prostatic hypertrophy) 04/15/2010  . Cataract   . Colon polyps   . DEPRESSION 01/22/2009  . Heart valve replaced   . HYPERCHOLESTEROLEMIA 01/22/2009  . HYPERTENSION 01/22/2009   Dr. [name]  . 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 Morbid obesity ([place])   . Tremors of nervous system    ?ptsd  . Ulcer    Family History  Problem Relation Age of Onset  . Heart disease Father   . Congestive Heart Failure Father   . Arthritis Father   . Diabetes Unknown   . Benign prostatic hyperplasia Brother    Social Hx: former smoker.Current medications reviewed.   ROS: Per HPI  Objective: Office vital signs  reviewed. BP (!) 154/73   Pulse 88   Temp (!) 97 F (36.1 C) (Oral)   Ht 6\' 1"  (1.854 m)   Wt 264 lb (119.7 kg)   BMI 34.83 kg/m   Physical Examination:  General: Awake, alert, nontoxic appearing, No acute distress Cardio: regular rate and rhythm, S1S2 heard Pulm: no wheeze, normal work of breathing on room air GU:no suprapubic TTP, no CVA TTP  No results found for this or any previous visit (from the past 24 hour([name])).   Assessment/ Plan: 71 y.o. male   1. Acute cystitis without hematuria Urinalysis with 3+ glucose, 1+ leukocytes. Urine micro-with 11-30 white blood cells per high-powered field, 0-2 red blood cells per high powered field, and few bacteria. Because both leukocytes and bacteria are seen on micro-and patient is demonstrating dysuria, will empirically treat with oral antibiotics. He is on a blood thinner and therefore, Septra was not chosen for this. Instead, ciprofloxacin 500 mg twice a day for the next 5 days was prescribed. Strict return precautions were reviewed with the patient. I did advise him to call his urologist for follow-up appointment regarding his symptoms. We will call with results of urine culture when available. Follow up as needed with primary care doctor. - Urinalysis, Complete - Urine Culture   Orders Placed This Encounter  Procedures  . Urine Culture  . Urinalysis, Complete   Meds ordered this encounter  Medications  . ciprofloxacin (CIPRO) 500 MG tablet  Sig: Take 1 tablet (500 mg total) by mouth 2 (two) times daily.    Dispense:  10 tablet    Refill:  0     [name] Nawrot Windell Moulding, DO [facility] [phone]

## 2016-11-07 ENCOUNTER — Telehealth: Payer: Self-pay | Admitting: *Deleted

## 2016-11-07 MED ORDER — AMOXICILLIN 500 MG PO CAPS: 2000.0000 mg | ORAL_CAPSULE | Freq: Once | ORAL | 0 refills | Status: AC

## 2016-11-07 NOTE — Telephone Encounter (Signed)
Rx sent  Laroy Apple, MD [facility] 11/07/2016, 11:49 AM

## 2016-11-07 NOTE — Telephone Encounter (Signed)
Pt notified of RX 

## 2016-11-07 NOTE — Telephone Encounter (Signed)
Pt having dental work on Friday Needs antibiotic due to heart valve replacement Please review and advise

## 2016-11-09 ENCOUNTER — Telehealth: Payer: Self-pay | Admitting: Family Medicine

## 2016-11-09 DIAGNOSIS — N3001 Acute cystitis with hematuria: Secondary | ICD-10-CM

## 2016-11-09 MED ORDER — NITROFURANTOIN MONOHYD MACRO 100 MG PO CAPS: 100.0000 mg | ORAL_CAPSULE | Freq: Two times a day (BID) | ORAL | 0 refills | Status: DC

## 2016-11-09 NOTE — Telephone Encounter (Signed)
Attempted to reach patient regarding his urine culture. He is growing a bacteria that is resistant to ciprofloxacin, which is what was prescribed for his urinary tract infection during our visit. I recommend that he discontinued the Cipro and start the nitrofurantoin which has been sent to his pharmacy. Please remind him to follow-up with his urologist at the [place].  Maila Dukes M. Lajuana Ripple, [facility]

## 2016-11-09 NOTE — Telephone Encounter (Signed)
Attempted to contact patient - NA °

## 2016-11-09 NOTE — Telephone Encounter (Signed)
Patient aware and verbalized understanding. °

## 2016-11-10 LAB — URINE CULTURE

## 2016-11-14 ENCOUNTER — Telehealth: Payer: Self-pay | Admitting: Family Medicine

## 2016-11-14 DIAGNOSIS — Z23 Encounter for immunization: Secondary | ICD-10-CM | POA: Diagnosis not present

## 2016-11-14 NOTE — Telephone Encounter (Signed)
Patient reported that he got his flu shot today at [facility]. Change made in chart.

## 2016-12-06 ENCOUNTER — Ambulatory Visit (INDEPENDENT_AMBULATORY_CARE_PROVIDER_SITE_OTHER): Payer: Medicare Other | Admitting: Family Medicine

## 2016-12-06 VITALS — BP 139/77 | HR 86 | Temp 97.1°F | Ht 73.0 in | Wt 262.6 lb

## 2016-12-06 DIAGNOSIS — G3184 Mild cognitive impairment, so stated: Secondary | ICD-10-CM | POA: Diagnosis not present

## 2016-12-06 DIAGNOSIS — N3001 Acute cystitis with hematuria: Secondary | ICD-10-CM

## 2016-12-06 DIAGNOSIS — E119 Type 2 diabetes mellitus without complications: Secondary | ICD-10-CM

## 2016-12-06 DIAGNOSIS — R350 Frequency of micturition: Secondary | ICD-10-CM | POA: Diagnosis not present

## 2016-12-06 LAB — URINALYSIS, COMPLETE
Bilirubin, UA: NEGATIVE
Glucose, UA: NEGATIVE
Ketones, UA: NEGATIVE
Nitrite, UA: NEGATIVE
Protein, UA: NEGATIVE
Specific Gravity, UA: 1.015 (ref 1.005–1.030)
Urobilinogen, Ur: 0.2 mg/dL (ref 0.2–1.0)
pH, UA: 7.5 (ref 5.0–7.5)

## 2016-12-06 LAB — MICROSCOPIC EXAMINATION
BACTERIA UA: NONE SEEN
Renal Epithel, UA: NONE SEEN /hpf

## 2016-12-06 LAB — BAYER DCA HB A1C WAIVED: HB A1C (BAYER DCA - WAIVED): 8 % — ABNORMAL HIGH (ref <7.0)

## 2016-12-06 MED ORDER — LEVOFLOXACIN 500 MG PO TABS: 500.0000 mg | ORAL_TABLET | Freq: Every day | ORAL | 0 refills | Status: DC

## 2016-12-06 NOTE — Progress Notes (Signed)
   HPI  Patient presents today here to follow-up for chronic medical conditions as well as concern for UTI and to discuss paperwork for the [place].  Type 2 diabetes Good medication compliance, no hypoglycemia.  Using 42 units of Levemir daily. Patient reports average fasting blood sugar is 120-140.  Increased urinary frequency for about 1 week.  No fever, chills, sweats. Patient was treated for UTI last month and had concerning resistant patterns  Paperwork- [facility]  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  He reports L knee OA making it difficult to get up from a sitting position. He has tremor that is worse with intention, he states that he is tried medication from neurology that did not work and is too strong.  PMH: Smoking status noted ROS: Per HPI  Objective: BP 139/77   Pulse 86   Temp (!) 97.1 F (36.2 C) (Oral)   Ht _0  (1.854 m)   Wt 262 lb 9.6 oz (119.1 kg)   BMI 34.65 kg/m  Gen: NAD, alert, cooperative with exam HEENT: NCAT, EOMI, PERRL CV: RRR, good S1/S2, no murmur Resp: CTABL, no wheezes, non-labored Abd: No CVA tenderness Ext: No edema, warm Neuro: Alert and oriented, No gross deficits  Assessment and plan:  #Type 2 diabetes Clinically doing well, A1c pending. No changes for now  #Mild cognitive impairment New diagnosis stone previous MMSE and report of memory loss. No changes for now, discussed with patient  #UTI Culture, treating with Levaquin given previous urine culture results No signs of urosepsis  Orders Placed This Encounter  Procedures  . Urine Culture  . Microscopic Examination  . Urinalysis, Complete  . Bayer DCA Hb A1c Waived  . CMP14+EGFR  . CBC with Differential/Platelet    Meds ordered this encounter  Medications  . levofloxacin (LEVAQUIN) 500 MG tablet    Sig: Take 1 tablet (500 mg total) daily by mouth.    Dispense:  7 tablet    Refill:  0    Laroy Apple, MD [facility] 12/06/2016, 10:35 AM

## 2016-12-06 NOTE — Patient Instructions (Signed)
Great to see you!  Prescribed Levaquin for UTI.  Please decrease your Celexa (citalopram) to 1/2 tablet daily while you are taking this medication.   Urinary Tract Infection, Adult A urinary tract infection (UTI) is an infection of any part of the urinary tract, which includes the kidneys, ureters, bladder, and urethra. These organs make, store, and get rid of urine in the body. UTI can be a bladder infection (cystitis) or kidney infection (pyelonephritis). What are the causes? This infection may be caused by fungi, viruses, or bacteria. Bacteria are the most common cause of UTIs. This condition can also be caused by repeated incomplete emptying of the bladder during urination. What increases the risk? This condition is more likely to develop if:  You ignore your need to urinate or hold urine for long periods of time.  You do not empty your bladder completely during urination.  You wipe back to front after urinating or having a bowel movement, if you are male.  You are uncircumcised, if you are male.  You are constipated.  You have a urinary catheter that stays in place (indwelling).  You have a weak defense (immune) system.  You have a medical condition that affects your bowels, kidneys, or bladder.  You have diabetes.  You take antibiotic medicines frequently or for long periods of time, and the antibiotics no longer work well against certain types of infections (antibiotic resistance).  You take medicines that irritate your urinary tract.  You are exposed to chemicals that irritate your urinary tract.  You are male.  What are the signs or symptoms? Symptoms of this condition include:  Fever.  Frequent urination or passing small amounts of urine frequently.  Needing to urinate urgently.  Pain or burning with urination.  Urine that smells bad or unusual.  Cloudy urine.  Pain in the lower abdomen or back.  Trouble urinating.  Blood in the  urine.  Vomiting or being less hungry than normal.  Diarrhea or abdominal pain.  Vaginal discharge, if you are male.  How is this diagnosed? This condition is diagnosed with a medical history and physical exam. You will also need to provide a urine sample to test your urine. Other tests may be done, including:  Blood tests.  Sexually transmitted disease (STD) testing.  If you have had more than one UTI, a cystoscopy or imaging studies may be done to determine the cause of the infections. How is this treated? Treatment for this condition often includes a combination of two or more of the following:  Antibiotic medicine.  Other medicines to treat less common causes of UTI.  Over-the-counter medicines to treat pain.  Drinking enough water to stay hydrated.  Follow these instructions at home:  Take over-the-counter and prescription medicines only as told by your health care provider.  If you were prescribed an antibiotic, take it as told by your health care provider. Do not stop taking the antibiotic even if you start to feel better.  Avoid alcohol, caffeine, tea, and carbonated beverages. They can irritate your bladder.  Drink enough fluid to keep your urine clear or pale yellow.  Keep all follow-up visits as told by your health care provider. This is important.  Make sure to: ? Empty your bladder often and completely. Do not hold urine for long periods of time. ? Empty your bladder before and after sex. ? Wipe from front to back after a bowel movement if you are male. Use each tissue one time when you wipe.  Contact a health care provider if:  You have back pain.  You have a fever.  You feel nauseous or vomit.  Your symptoms do not get better after 3 days.  Your symptoms go away and then return. Get help right away if:  You have severe back pain or lower abdominal pain.  You are vomiting and cannot keep down any medicines or water. This information is not  intended to replace advice given to you by your health care provider. Make sure you discuss any questions you have with your health care provider. Document Released: 10/20/2004 Document Revised: 06/24/2015 Document Reviewed: 12/01/2014 Elsevier Interactive Patient Education  2017 [employer].

## 2016-12-07 LAB — CMP14+EGFR
ALT: 24 IU/L (ref 0–44)
AST: 26 IU/L (ref 0–40)
Albumin/Globulin Ratio: 1.9 (ref 1.2–2.2)
Albumin: 4.3 g/dL (ref 3.5–4.8)
Alkaline Phosphatase: 70 IU/L (ref 39–117)
BUN/Creatinine Ratio: 15 (ref 10–24)
BUN: 18 mg/dL (ref 8–27)
Bilirubin Total: 0.4 mg/dL (ref 0.0–1.2)
CALCIUM: 9.2 mg/dL (ref 8.6–10.2)
CO2: 23 mmol/L (ref 20–29)
CREATININE: 1.22 mg/dL (ref 0.76–1.27)
Chloride: 97 mmol/L (ref 96–106)
GFR, EST AFRICAN AMERICAN: 68 mL/min/{1.73_m2} (ref >59)
GFR, EST NON AFRICAN AMERICAN: 58 mL/min/{1.73_m2} — AB (ref >59)
GLUCOSE: 202 mg/dL — AB (ref 65–99)
Globulin, Total: 2.3 g/dL (ref 1.5–4.5)
Potassium: 5.1 mmol/L (ref 3.5–5.2)
Sodium: 136 mmol/L (ref 134–144)
TOTAL PROTEIN: 6.6 g/dL (ref 6.0–8.5)

## 2016-12-07 LAB — CBC WITH DIFFERENTIAL/PLATELET
BASOS: 0 %
Basophils Absolute: 0 10*3/uL (ref 0.0–0.2)
EOS (ABSOLUTE): 0.3 10*3/uL (ref 0.0–0.4)
Eos: 3 %
HEMOGLOBIN: 13.7 g/dL (ref 13.0–17.7)
Hematocrit: 41.2 % (ref 37.5–51.0)
IMMATURE GRANS (ABS): 0 10*3/uL (ref 0.0–0.1)
IMMATURE GRANULOCYTES: 0 %
LYMPHS: 23 %
Lymphocytes Absolute: 1.8 10*3/uL (ref 0.7–3.1)
MCH: 32 pg (ref 26.6–33.0)
MCHC: 33.3 g/dL (ref 31.5–35.7)
MCV: 96 fL (ref 79–97)
MONOCYTES: 7 %
Monocytes Absolute: 0.6 10*3/uL (ref 0.1–0.9)
NEUTROS ABS: 5.1 10*3/uL (ref 1.4–7.0)
Neutrophils: 67 %
PLATELETS: 149 10*3/uL — AB (ref 150–379)
RBC: 4.28 x10E6/uL (ref 4.14–5.80)
RDW: 15.3 % (ref 12.3–15.4)
WBC: 7.8 10*3/uL (ref 3.4–10.8)

## 2016-12-07 LAB — URINE CULTURE

## 2016-12-09 ENCOUNTER — Telehealth: Payer: Self-pay | Admitting: Family Medicine

## 2016-12-09 NOTE — Telephone Encounter (Signed)
Just making sure labs were faxed to [facility] - they were sent yesterday by me

## 2016-12-12 ENCOUNTER — Ambulatory Visit (INDEPENDENT_AMBULATORY_CARE_PROVIDER_SITE_OTHER): Payer: Medicare Other | Admitting: *Deleted

## 2016-12-12 ENCOUNTER — Telehealth: Payer: Self-pay | Admitting: Cardiology

## 2016-12-12 DIAGNOSIS — I442 Atrioventricular block, complete: Secondary | ICD-10-CM | POA: Diagnosis not present

## 2016-12-12 NOTE — Telephone Encounter (Signed)
Spoke with pt and reminded pt of remote transmission that is due today. Pt verbalized understanding.   

## 2016-12-14 NOTE — Progress Notes (Signed)
Remote pacemaker transmission.   

## 2016-12-21 ENCOUNTER — Telehealth: Payer: Self-pay | Admitting: Family Medicine

## 2016-12-21 ENCOUNTER — Other Ambulatory Visit: Payer: Self-pay | Admitting: Family Medicine

## 2016-12-21 NOTE — Telephone Encounter (Signed)
lmtcb

## 2016-12-21 NOTE — Telephone Encounter (Signed)
Pt requesting additional antibiotics due to symptoms of UTI again.  His original urine culture did not show any significant bacterial growth.  I will decline additional antibiotics and recommend follow-up appointment to discuss his symptoms.  Laroy Apple, MD [facility] 12/21/2016, 12:18 PM

## 2016-12-21 NOTE — Telephone Encounter (Signed)
Patient states that he still has some infection and would like another round of antibiotics called in to [facility]. Please advise

## 2016-12-22 ENCOUNTER — Encounter: Payer: Self-pay | Admitting: Cardiology

## 2016-12-22 NOTE — Telephone Encounter (Signed)
Left detailed message per dpr. - patient ntbs

## 2017-01-04 LAB — CUP PACEART REMOTE DEVICE CHECK
Battery Remaining Longevity: 130 mo
Brady Statistic AP VP Percent: 1.3 %
Brady Statistic AP VS Percent: 0 %
Brady Statistic AS VS Percent: 0.12 %
Brady Statistic RA Percent Paced: 1.29 %
Brady Statistic RV Percent Paced: 99.87 %
Implantable Lead Implant Date: 20180521
Implantable Lead Location: 753859
Lead Channel Impedance Value: 418 Ohm
Lead Channel Impedance Value: 456 Ohm
Lead Channel Pacing Threshold Amplitude: 0.75 V
Lead Channel Pacing Threshold Pulse Width: 0.4 ms
Lead Channel Sensing Intrinsic Amplitude: 1.125 mV
Lead Channel Sensing Intrinsic Amplitude: 19.75 mV
Lead Channel Sensing Intrinsic Amplitude: 19.75 mV
Lead Channel Setting Pacing Amplitude: 2.5 V
Lead Channel Setting Sensing Sensitivity: 2 mV
MDC IDC LEAD IMPLANT DT: 20180521
MDC IDC LEAD LOCATION: 753859
MDC IDC MSMT BATTERY VOLTAGE: 3.13 V
MDC IDC MSMT LEADCHNL RA IMPEDANCE VALUE: 323 Ohm
MDC IDC MSMT LEADCHNL RA PACING THRESHOLD AMPLITUDE: 0.875 V
MDC IDC MSMT LEADCHNL RA SENSING INTR AMPL: 1.125 mV
MDC IDC MSMT LEADCHNL RV IMPEDANCE VALUE: 342 Ohm
MDC IDC MSMT LEADCHNL RV PACING THRESHOLD PULSEWIDTH: 0.4 ms
MDC IDC PG IMPLANT DT: 20180521
MDC IDC SESS DTM: 20181120195648
MDC IDC SET LEADCHNL RA PACING AMPLITUDE: 1.75 V
MDC IDC SET LEADCHNL RV PACING PULSEWIDTH: 0.4 ms
MDC IDC STAT BRADY AS VP PERCENT: 98.58 %

## 2017-01-09 ENCOUNTER — Encounter: Payer: Self-pay | Admitting: Family Medicine

## 2017-01-09 ENCOUNTER — Ambulatory Visit: Payer: Medicare Other | Admitting: Family Medicine

## 2017-01-09 ENCOUNTER — Ambulatory Visit (INDEPENDENT_AMBULATORY_CARE_PROVIDER_SITE_OTHER): Payer: Medicare Other | Admitting: Family Medicine

## 2017-01-09 VITALS — BP 117/72 | HR 98 | Temp 97.6°F | Ht 73.0 in | Wt 258.0 lb

## 2017-01-09 DIAGNOSIS — J069 Acute upper respiratory infection, unspecified: Secondary | ICD-10-CM

## 2017-01-09 NOTE — Patient Instructions (Addendum)
It appears that you have a viral upper respiratory infection (cold).  Cold symptoms can last up to 2 weeks.  I recommend that you only use cold medications that are safe in high blood pressure like Coricidin (generic is fine).  Other cold medications can increase your blood pressure.    - Get plenty of rest and drink plenty of fluids. - Try to breathe moist air. Use a cold mist humidifier. - Consume warm fluids (soup or tea) to provide relief for a stuffy nose and to loosen phlegm. - For nasal stuffiness, try saline nasal spray or a Neti Pot.  Afrin nasal spray can also be used but this product should not be used longer than 3 days or it will cause rebound nasal stuffiness (worsening nasal congestion). - For sore throat pain relief: suck on throat lozenges, hard candy or popsicles; gargle with warm salt water (1/4 tsp. salt per 8 oz. of water); and eat soft, bland foods. - Eat a well-balanced diet. If you cannot, ensure you are getting enough nutrients by taking a daily multivitamin. - Avoid dairy products, as they can thicken phlegm. - Avoid alcohol, as it impairs your body's immune system.  CONTACT YOUR DOCTOR IF YOU EXPERIENCE ANY OF THE FOLLOWING: - High fever - Ear pain - Sinus-type headache - Unusually severe cold symptoms - Cough that gets worse while other cold symptoms improve - Flare up of any chronic lung problem, such as asthma - Your symptoms persist longer than 2 weeks  Upper Respiratory Infection, Adult Most upper respiratory infections (URIs) are caused by a virus. A URI affects the nose, throat, and upper air passages. The most common type of URI is often called "the common cold." Follow these instructions at home:  Take medicines only as told by your doctor.  Gargle warm saltwater or take cough drops to comfort your throat as told by your doctor.  Use a warm mist humidifier or inhale steam from a shower to increase air moisture. This may make it easier to  breathe.  Drink enough fluid to keep your pee (urine) clear or pale yellow.  Eat soups and other clear broths.  Have a healthy diet.  Rest as needed.  Go back to work when your fever is gone or your doctor says it is okay. ? You may need to stay home longer to avoid giving your URI to others. ? You can also wear a face mask and wash your hands often to prevent spread of the virus.  Use your inhaler more if you have asthma.  Do not use any tobacco products, including cigarettes, chewing tobacco, or electronic cigarettes. If you need help quitting, ask your doctor. Contact a doctor if:  You are getting worse, not better.  Your symptoms are not helped by medicine.  You have chills.  You are getting more short of breath.  You have brown or red mucus.  You have yellow or brown discharge from your nose.  You have pain in your face, especially when you bend forward.  You have a fever.  You have puffy (swollen) neck glands.  You have pain while swallowing.  You have white areas in the back of your throat. Get help right away if:  You have very bad or constant: ? Headache. ? Ear pain. ? Pain in your forehead, behind your eyes, and over your cheekbones (sinus pain). ? Chest pain.  You have long-lasting (chronic) lung disease and any of the following: ? Wheezing. ? Long-lasting cough. ?  Coughing up blood. ? A change in your usual mucus.  You have a stiff neck.  You have changes in your: ? Vision. ? Hearing. ? Thinking. ? Mood. This information is not intended to replace advice given to you by your health care provider. Make sure you discuss any questions you have with your health care provider. Document Released: 06/29/2007 Document Revised: 09/13/2015 Document Reviewed: 04/17/2013 Elsevier Interactive Patient Education  2018 [employer].

## 2017-01-09 NOTE — Progress Notes (Signed)
Subjective: CC: cold symptoms PCP: Timmothy Euler, MD WUJ:WJXBJY S Sigl is a 71 y.o. male presenting to clinic today for:  1. Cold symptoms  Patient reports dry cough, nasal congestion, rhinorrhea and frontal headache that started 3 days ago.  Denies productive cough, hemoptysis, SOB, dizziness, rash, nausea, vomiting, diarrhea, fevers, chills, myalgia, sick contacts, recent travel.  Patient has used Tylenol 500 mg as needed with little relief of symptoms.  Denies history of COPD or asthma.  Former tobacco use/ exposure.  Past medical history significant for type 2 diabetes and cardiovascular disease.  ROS: Per HPI  Allergies  Allergen Reactions  . Phenothiazines Anaphylaxis  . Actos [Pioglitazone] Swelling   Past Medical History:  Diagnosis Date  . Arthritis   . BPH (benign prostatic hypertrophy) 04/15/2010  . Cataract   . Colon polyps   . DEPRESSION 01/22/2009  . Heart valve replaced   . HYPERCHOLESTEROLEMIA 01/22/2009  . HYPERTENSION 01/22/2009   Dr. [name]  . 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 Morbid obesity ([place])   . Tremors of nervous system    ?ptsd  . Ulcer     Current Outpatient Medications:  .  acetaminophen (TYLENOL) 500 MG tablet, Take 1,000 mg by mouth every 6 (six) hours as needed (pain)., Disp: , Rfl:  .  apixaban (ELIQUIS) 5 MG TABS tablet, Take 1 tablet (5 mg total) by mouth 2 (two) times daily., Disp: 180 tablet, Rfl: 3 .  cholecalciferol (VITAMIN D) 1000 units tablet, Take 1,000 Units by mouth 2 (two) times daily., Disp: , Rfl:  .  citalopram (CELEXA) 20 MG tablet, Take 1 tablet (20 mg total) by mouth daily., Disp: 30 tablet, Rfl: 0 .  fluticasone (FLONASE) 50 MCG/ACT nasal spray, Place 2 sprays into both nostrils daily., Disp: 16 g, Rfl: 6 .  furosemide (LASIX) 20 MG tablet, Take 20 mg by mouth daily., Disp: , Rfl:  .  glucose blood test strip, 1 strip by Does not apply route 3 (three) times daily. Uses true metrix  strips (diabetic club), Disp: , Rfl: 12 .  glucose blood test strip, DIABETIC CLUB, Disp: , Rfl: 12 .  insulin detemir (LEVEMIR) 100 UNIT/ML injection, Inject 0.47-0.48 mLs (47-48 Units total) into the skin 2 (two) times daily., Disp: 10 mL, Rfl:  .  Insulin Pen Needle (B-D ULTRAFINE III SHORT PEN) 31G X 8 MM MISC, Use to inject Victoza qd, Disp: 100 each, Rfl: 5 .  levofloxacin (LEVAQUIN) 500 MG tablet, Take 1 tablet (500 mg total) daily by mouth., Disp: 7 tablet, Rfl: 0 .  levothyroxine (SYNTHROID, LEVOTHROID) 200 MCG tablet, Take 1 tablet (200 mcg total) by mouth daily before breakfast., Disp: 90 tablet, Rfl: 3 .  metFORMIN (GLUCOPHAGE) 1000 MG tablet, TAKE 1 TABLET BY MOUTH TWICE DAILY WITH A MEAL., Disp: 180 tablet, Rfl: 0 .  Multiple Vitamins-Minerals (MENS MULTI VITAMIN & MINERAL PO), Take 1 tablet by mouth daily.  , Disp: , Rfl:  .  Omega-3 Fatty Acids (FISH OIL) 1200 MG CAPS, Take 1,200-2,400 mg by mouth 2 (two) times daily. Take 1 capsule (1200 mg) by mouth daily at noon and 2 capsules (2400 mg) daily at bedtime, Disp: , Rfl:  .  simvastatin (ZOCOR) 80 MG tablet, Take 40 mg by mouth at bedtime. , Disp: , Rfl:  .  tamsulosin (FLOMAX) 0.4 MG CAPS capsule, Take 0.4 mg by mouth at bedtime., Disp: , Rfl:  .  VICTOZA 18 MG/3ML SOPN, INJECT 1.2 MG INTO THE  SKIN DAILY. START WITH 0.6 MG DAILY FOR 1 WEEK THEN INCREASE TO 1.2 MG, Disp: 6 mL, Rfl: 2 Social History   Socioeconomic History  . Marital status: Single    Spouse name: Not on file  . Number of children: 1  . Years of education: HS  . Highest education level: Not on file  Social Needs  . Financial resource strain: Not on file  . Food insecurity - worry: Not on file  . Food insecurity - inability: Not on file  . Transportation needs - medical: Not on file  . Transportation needs - non-medical: Not on file  Occupational History  . Occupation: Administrator, retired    Fish farm manager: RETIRED  Tobacco Use  . Smoking status: Former Smoker      Packs/day: 0.00    Years: 12.00    Pack years: 0.00    Types: Cigarettes    Last attempt to quit: 09/20/1961    Years since quitting: 55.3  . Smokeless tobacco: Never Used  Substance and Sexual Activity  . Alcohol use: No  . Drug use: No  . Sexual activity: No    Comment: Has not smoked in 20 years  Other Topics Concern  . Not on file  Social History Narrative   Lives alone.   Quit smoking approximately 28 years ago.   Long haul truck driver, lives in [place].   Family History  Problem Relation Age of Onset  . Heart disease Father   . Congestive Heart Failure Father   . Arthritis Father   . Diabetes Unknown   . Benign prostatic hyperplasia Brother     Objective: Office vital signs reviewed. BP 117/72   Pulse 98   Temp 97.6 F (36.4 C) (Oral)   Ht 6\' 1"  (1.854 m)   Wt 258 lb (117 kg)   SpO2 96%   BMI 34.04 kg/m   Physical Examination:  General: Awake, alert, disheveled, nontoxic, no acute distress HEENT: Normal    Neck: No masses palpated. No lymphadenopathy    Ears: Tympanic membranes intact, normal light reflex, no erythema, no bulging    Eyes: PERRLA, extraocular membranes intact, sclera white, no ocular discharge     Nose: nasal turbinates moist, clear nasal discharge    Throat: moist mucus membranes, no erythema, no tonsillar exudate.  Airway is patent Cardio: regular rate and rhythm, S1S2 heard, 2/6 systolic murmur appreciated at the right sternal border Pulm: clear to auscultation bilaterally, no wheezes, rhonchi or rales; normal work of breathing on room air  Assessment/ Plan: 71 y.o. male   1. URI with cough and congestion Patient is afebrile, nontoxic-appearing with normal vital signs.  Physical exam did not demonstrate any findings suggestive of bacterial infection.  I suspect that he has a viral URI.  Supportive care recommended.  Home care instructions were reviewed.  Patient will pick up Coricidin for symptoms. Strict return precautions and  reasons for emergent evaluation in the emergency department review with patient.  He voiced understanding and will follow-up as needed.   Janora Norlander, DO [facility] [phone]

## 2017-01-25 ENCOUNTER — Other Ambulatory Visit: Payer: Self-pay

## 2017-01-25 DIAGNOSIS — E119 Type 2 diabetes mellitus without complications: Secondary | ICD-10-CM

## 2017-01-30 NOTE — Progress Notes (Signed)
Subjective: CC: feet burning PCP: Timmothy Euler, MD VQQ:[id number] [name] is a 72 y.o. male presenting to clinic today for:  1. Feet burning Patient reports onset of burning pain in bilateral feet about 3 weeks ago.  He notes that he has been applying arthritis cream to the feet which does seem to help some.  Pain is worse in the morning and better at the end of the evening.  He denies numbness, tingling, swelling, discoloration, history of ulcers or skin breakdown.  His past medical history is significant for uncontrolled type 2 diabetes, with most recent A1c greater than 8.  He also has a past medical history of B12 deficiency.  He has not had B12 checked in some time and is not on any B12 supplementation.  He notes that he used to see a podiatrist many years ago at the [place] but is no longer seeing them.  Additionally, medical history significant for hypothyroidism currently treated with Synthroid 200 mcg tablets.  Social history negative for alcoholism.   ROS: Per HPI  Allergies  Allergen Reactions  . Phenothiazines Anaphylaxis  . Actos [Pioglitazone] Swelling   Past Medical History:  Diagnosis Date  . Arthritis   . BPH (benign prostatic hypertrophy) 04/15/2010  . Cataract   . Colon polyps   . DEPRESSION 01/22/2009  . Heart valve replaced   . HYPERCHOLESTEROLEMIA 01/22/2009  . HYPERTENSION 01/22/2009   Dr. [name]  . 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 Morbid obesity ([place])   . Tremors of nervous system    ?ptsd  . Ulcer     Current Outpatient Medications:  .  acetaminophen (TYLENOL) 500 MG tablet, Take 1,000 mg by mouth every 6 (six) hours as needed (pain)., Disp: , Rfl:  .  apixaban (ELIQUIS) 5 MG TABS tablet, Take 1 tablet (5 mg total) by mouth 2 (two) times daily., Disp: 180 tablet, Rfl: 3 .  cholecalciferol (VITAMIN D) 1000 units tablet, Take 1,000 Units by mouth 2 (two) times daily., Disp: , Rfl:  .  citalopram (CELEXA) 20 MG tablet,  Take 1 tablet (20 mg total) by mouth daily., Disp: 30 tablet, Rfl: 0 .  fluticasone (FLONASE) 50 MCG/ACT nasal spray, Place 2 sprays into both nostrils daily., Disp: 16 g, Rfl: 6 .  furosemide (LASIX) 20 MG tablet, Take 20 mg by mouth daily., Disp: , Rfl:  .  glucose blood test strip, 1 strip by Does not apply route 3 (three) times daily. Uses true metrix strips (diabetic club), Disp: , Rfl: 12 .  glucose blood test strip, DIABETIC CLUB, Disp: , Rfl: 12 .  insulin detemir (LEVEMIR) 100 UNIT/ML injection, Inject 0.47-0.48 mLs (47-48 Units total) into the skin 2 (two) times daily., Disp: 10 mL, Rfl:  .  Insulin Pen Needle (B-D ULTRAFINE III SHORT PEN) 31G X 8 MM MISC, Use to inject Victoza qd, Disp: 100 each, Rfl: 5 .  levothyroxine (SYNTHROID, LEVOTHROID) 200 MCG tablet, Take 1 tablet (200 mcg total) by mouth daily before breakfast., Disp: 90 tablet, Rfl: 3 .  metFORMIN (GLUCOPHAGE) 1000 MG tablet, TAKE 1 TABLET BY MOUTH TWICE DAILY WITH A MEAL., Disp: 180 tablet, Rfl: 0 .  Multiple Vitamins-Minerals (MENS MULTI VITAMIN & MINERAL PO), Take 1 tablet by mouth daily.  , Disp: , Rfl:  .  Omega-3 Fatty Acids (FISH OIL) 1200 MG CAPS, Take 1,200-2,400 mg by mouth 2 (two) times daily. Take 1 capsule (1200 mg) by mouth daily at noon and 2 capsules (2400  mg) daily at bedtime, Disp: , Rfl:  .  simvastatin (ZOCOR) 80 MG tablet, Take 40 mg by mouth at bedtime. , Disp: , Rfl:  .  tamsulosin (FLOMAX) 0.4 MG CAPS capsule, Take 0.4 mg by mouth at bedtime., Disp: , Rfl:  .  VICTOZA 18 MG/3ML SOPN, INJECT 1.2 MG INTO THE SKIN DAILY. START WITH 0.6 MG DAILY FOR 1 WEEK THEN INCREASE TO 1.2 MG, Disp: 6 mL, Rfl: 2 Social History   Socioeconomic History  . Marital status: Single    Spouse name: Not on file  . Number of children: 1  . Years of education: HS  . Highest education level: Not on file  Social Needs  . Financial resource strain: Not on file  . Food insecurity - worry: Not on file  . Food insecurity -  inability: Not on file  . Transportation needs - medical: Not on file  . Transportation needs - non-medical: Not on file  Occupational History  . Occupation: Administrator, retired    Fish farm manager: RETIRED  Tobacco Use  . Smoking status: Former Smoker    Packs/day: 0.00    Years: 12.00    Pack years: 0.00    Types: Cigarettes    Last attempt to quit: 09/20/1961    Years since quitting: 55.4  . Smokeless tobacco: Never Used  Substance and Sexual Activity  . Alcohol use: No  . Drug use: No  . Sexual activity: No    Comment: Has not smoked in 20 years  Other Topics Concern  . Not on file  Social History Narrative   Lives alone.   Quit smoking approximately 28 years ago.   Long haul truck driver, lives in [place].   Family History  Problem Relation Age of Onset  . Heart disease Father   . Congestive Heart Failure Father   . Arthritis Father   . Diabetes Unknown   . Benign prostatic hyperplasia Brother     Objective: Office vital signs reviewed. BP 131/76   Pulse 91   Temp 97.8 F (36.6 C)   Ht 6\' 1"  (1.854 m)   Wt 262 lb (118.8 kg)   BMI 34.57 kg/m   Physical Examination:  General: Awake, alert, obese, No acute distress Extremities: Cool, no edema, cyanosis or clubbing; +2 pedal pulses bilaterally MSK: Normal gait and normal station Skin: dry; intact; no rashes or lesions Neuro: Diminished vibratory sense, diminished ability to perceive hot versus cold, decreased monofilament testing as below.  Diabetic Foot Exam - Simple   Simple Foot Form Diabetic Foot exam was performed with the following findings:  Yes 01/31/2017 11:38 AM  Visual Inspection No deformities, no ulcerations, no other skin breakdown bilaterally:  Yes Sensation Testing See comments:  Yes Pulse Check See comments:  Yes Comments Monofilament testing was not normal during today'[name] exam.  He had decreased vibratory sense in bilateral feet.  No ulceration or skin breakdown.  He does have thickened,  onychomycotic great toenails.  Diminished posterior tibial pulses bilaterally.  Dorsalis pulse 2+ bilaterally.  Additionally, does not have good cold versus hot sensation.     Assessment/ Plan: 72 y.o. male   1. Peripheral polyneuropathy Likely secondary to uncontrolled diabetes.  Diabetic foot exam was performed today which did reveal decreased monofilament sensation, decreased vibratory sense and decreased ability to sense temperature.  No ulcerations.  Social history was negative for alcoholism.  He does not appear to be on any medications which would cause neuropathy.  He has known vitamin  B12 deficiency in the past as well as hypothyroidism.  Last TSH was within normal limits.  Currently on supplementation.  Thyroid etiology probably unlikely but will check this given history.  Check B12 level given decreased vibratory sensation.  It does not appear that he is ever had an HIV test before.  We will also check this as well.  If labs are unremarkable, would consider initiating gabapentin or Lyrica for peripheral neuropathy.  Could also consider nerve conduction testing.  Would strongly consider writing a prescription for diabetic shoes given neuropathy.  I recommended the patient follow-up with his PCP in 2 weeks for further discussion of this.  Will contact patient with the results of his labs once they are available. - Vitamin B12 - CBC - TSH - HIV antibody (with reflex)   Orders Placed This Encounter  Procedures  . Vitamin B12  . CBC  . TSH  . HIV antibody (with reflex)    [name], [place] [phone]

## 2017-01-31 ENCOUNTER — Encounter: Payer: Self-pay | Admitting: Family Medicine

## 2017-01-31 ENCOUNTER — Ambulatory Visit (INDEPENDENT_AMBULATORY_CARE_PROVIDER_SITE_OTHER): Payer: Medicare Other | Admitting: Family Medicine

## 2017-01-31 VITALS — BP 131/76 | HR 91 | Temp 97.8°F | Ht 73.0 in | Wt 262.0 lb

## 2017-01-31 DIAGNOSIS — G629 Polyneuropathy, unspecified: Secondary | ICD-10-CM

## 2017-01-31 NOTE — Patient Instructions (Signed)
This is probably from your diabetes.  However, I will check your blood to make sure that there is no other reason that you are having numbness in your feet.  He will be contacted with these results.  Follow-up with Dr. Wendi Snipes in 2 weeks.   Neuropathic Pain Neuropathic pain is pain caused by damage to the nerves that are responsible for certain sensations in your body (sensory nerves). The pain can be caused by damage to:  The sensory nerves that send signals to your spinal cord and brain (peripheral nervous system).  The sensory nerves in your brain or spinal cord (central nervous system).  Neuropathic pain can make you more sensitive to pain. What would be a minor sensation for most people may feel very painful if you have neuropathic pain. This is usually a long-term condition that can be difficult to treat. The type of pain can differ from person to person. It may start suddenly (acute), or it may develop slowly and last for a long time (chronic). Neuropathic pain may come and go as damaged nerves heal or may stay at the same level for years. It often causes emotional distress, loss of sleep, and a lower quality of life. What are the causes? The most common cause of damage to a sensory nerve is diabetes. Many other diseases and conditions can also cause neuropathic pain. Causes of neuropathic pain can be classified as:  Toxic. Many drugs and chemicals can cause toxic damage. The most common cause of toxic neuropathic pain is damage from drug treatment for cancer (chemotherapy).  Metabolic. This type of pain can happen when a disease causes imbalances that damage nerves. Diabetes is the most common of these diseases. Vitamin B deficiency caused by long-term alcohol abuse is another common cause.  Traumatic. Any injury that cuts, crushes, or stretches a nerve can cause damage and pain. A common example is feeling pain after losing an arm or leg (phantom limb pain).  Compression-related. If a  sensory nerve gets trapped or compressed for a long period of time, the blood supply to the nerve can be cut off.  Vascular. Many blood vessel diseases can cause neuropathic pain by decreasing blood supply and oxygen to nerves.  Autoimmune. This type of pain results from diseases in which the body's defense system mistakenly attacks sensory nerves. Examples of autoimmune diseases that can cause neuropathic pain include lupus and multiple sclerosis.  Infectious. Many types of viral infections can damage sensory nerves and cause pain. Shingles infection is a common cause of this type of pain.  Inherited. Neuropathic pain can be a symptom of many diseases that are passed down through families (genetic).  What are the signs or symptoms? The main symptom is pain. Neuropathic pain is often described as:  Burning.  Shock-like.  Stinging.  Hot or cold.  Itching.  How is this diagnosed? No single test can diagnose neuropathic pain. Your health care provider will do a physical exam and ask you about your pain. You may use a pain scale to describe how bad your pain is. You may also have tests to see if you have a high sensitivity to pain and to help find the cause and location of any sensory nerve damage. These tests may include:  Imaging studies, such as: ? X-rays. ? CT scan. ? MRI.  Nerve conduction studies to test how well nerve signals travel through your sensory nerves (electrodiagnostic testing).  Stimulating your sensory nerves through electrodes on your skin and measuring the  response in your spinal cord and brain (somatosensory evoked potentials).  How is this treated? Treatment for neuropathic pain may change over time. You may need to try different treatment options or a combination of treatments. Some options include:  Over-the-counter pain relievers.  Prescription medicines. Some medicines used to treat other conditions may also help neuropathic pain. These include medicines  to: ? Control seizures (anticonvulsants). ? Relieve depression (antidepressants).  Prescription-strength pain relievers (narcotics). These are usually used when other pain relievers do not help.  Transcutaneous nerve stimulation (TENS). This uses electrical currents to block painful nerve signals. The treatment is painless.  Topical and local anesthetics. These are medicines that numb the nerves. They can be injected as a nerve block or applied to the skin.  Alternative treatments, such as: ? Acupuncture. ? Meditation. ? Massage. ? Physical therapy. ? Pain management programs. ? Counseling.  Follow these instructions at home:  Learn as much as you can about your condition.  Take medicines only as directed by your health care provider.  Work closely with all your health care providers to find what works best for you.  Have a good support system at home.  Consider joining a chronic pain support group. Contact a health care provider if:  Your pain treatments are not helping.  You are having side effects from your medicines.  You are struggling with fatigue, mood changes, depression, or anxiety. This information is not intended to replace advice given to you by your health care provider. Make sure you discuss any questions you have with your health care provider. Document Released: 10/08/2003 Document Revised: 07/31/2015 Document Reviewed: 06/20/2013 Elsevier Interactive Patient Education  [employer].

## 2017-02-01 LAB — TSH: TSH: 0.222 u[IU]/mL — ABNORMAL LOW (ref 0.450–4.500)

## 2017-02-01 LAB — HIV ANTIBODY (ROUTINE TESTING W REFLEX): HIV Screen 4th Generation wRfx: NONREACTIVE

## 2017-02-01 LAB — CBC
HEMATOCRIT: 40.2 % (ref 37.5–51.0)
HEMOGLOBIN: 13.4 g/dL (ref 13.0–17.7)
MCH: 32.1 pg (ref 26.6–33.0)
MCHC: 33.3 g/dL (ref 31.5–35.7)
MCV: 96 fL (ref 79–97)
Platelets: 131 10*3/uL — ABNORMAL LOW (ref 150–379)
RBC: 4.17 x10E6/uL (ref 4.14–5.80)
RDW: 15.3 % (ref 12.3–15.4)
WBC: 7.2 10*3/uL (ref 3.4–10.8)

## 2017-02-01 LAB — VITAMIN B12: VITAMIN B 12: 312 pg/mL (ref 232–1245)

## 2017-02-02 ENCOUNTER — Ambulatory Visit: Payer: Medicare Other | Admitting: *Deleted

## 2017-02-24 ENCOUNTER — Encounter: Payer: Self-pay | Admitting: Family Medicine

## 2017-02-24 ENCOUNTER — Ambulatory Visit (INDEPENDENT_AMBULATORY_CARE_PROVIDER_SITE_OTHER): Payer: Medicare Other | Admitting: Family Medicine

## 2017-02-24 VITALS — BP 132/72 | HR 88 | Temp 97.0°F | Ht 73.0 in | Wt 260.8 lb

## 2017-02-24 DIAGNOSIS — J189 Pneumonia, unspecified organism: Secondary | ICD-10-CM

## 2017-02-24 MED ORDER — AZITHROMYCIN 250 MG PO TABS: ORAL_TABLET | ORAL | 0 refills | Status: DC

## 2017-02-24 NOTE — Patient Instructions (Signed)
Great to see you!  Come back in 2-3 weeks for a diabetic recheck.   Start azithromycin today  Try Melatonin 5 mg for sleep.    Acute Bronchitis, Adult Acute bronchitis is when air tubes (bronchi) in the lungs suddenly get swollen. The condition can make it hard to breathe. It can also cause these symptoms:  A cough.  Coughing up clear, yellow, or green mucus.  Wheezing.  Chest congestion.  Shortness of breath.  A fever.  Body aches.  Chills.  A sore throat.  Follow these instructions at home: Medicines  Take over-the-counter and prescription medicines only as told by your doctor.  If you were prescribed an antibiotic medicine, take it as told by your doctor. Do not stop taking the antibiotic even if you start to feel better. General instructions  Rest.  Drink enough fluids to keep your pee (urine) clear or pale yellow.  Avoid smoking and secondhand smoke. If you smoke and you need help quitting, ask your doctor. Quitting will help your lungs heal faster.  Use an inhaler, cool mist vaporizer, or humidifier as told by your doctor.  Keep all follow-up visits as told by your doctor. This is important. How is this prevented? To lower your risk of getting this condition again:  Wash your hands often with soap and water. If you cannot use soap and water, use hand sanitizer.  Avoid contact with people who have cold symptoms.  Try not to touch your hands to your mouth, nose, or eyes.  Make sure to get the flu shot every year.  Contact a doctor if:  Your symptoms do not get better in 2 weeks. Get help right away if:  You cough up blood.  You have chest pain.  You have very bad shortness of breath.  You become dehydrated.  You faint (pass out) or keep feeling like you are going to pass out.  You keep throwing up (vomiting).  You have a very bad headache.  Your fever or chills gets worse. This information is not intended to replace advice given to you  by your health care provider. Make sure you discuss any questions you have with your health care provider. Document Released: 06/29/2007 Document Revised: 08/19/2015 Document Reviewed: 07/01/2015 Elsevier Interactive Patient Education  [employer].

## 2017-02-24 NOTE — Progress Notes (Signed)
   HPI  Patient presents today here for cough.  Patient explains he has had 2 weeks of cough, nasal congestion, and has developed a left side pain.  She describes sharp left-sided mid axillary side pain whenever he takes a deep inspiration. He notes chills. He denies overt fever or sweats.  He is tolerating food and fluids like usual. He denies any leg swelling that is unusual  PMH: Smoking status noted ROS: Per HPI  Objective: BP 132/72   Pulse 88   Temp (!) 97 F (36.1 C) (Oral)   Ht 6\' 1"  (1.854 m)   Wt 260 lb 12.8 oz (118.3 kg)   SpO2 99%   BMI 34.41 kg/m  Gen: NAD, alert, cooperative with exam HEENT: NCAT, oropharynx moist and clear, TMs normal bilaterally, no tenderness to palpation of bilateral sinuses CV: RRR, good S1/S2, no murmur Resp: CTABL, no wheezes, non-labored Ext: No edema, warm Neuro: Alert and oriented, No gross deficits  Assessment and plan:  #Atypical Pneumonia I will go ahead and cover him with azithromycin for atypical pneumonia He has cough for 2 weeks and is developing what sounds to be a pleuritic chest pain.  Exline he is tolerating food and fluids and his exam is reassuring. Low threshold for following up, patient is due for diabetic recheck in just a few weeks     Meds ordered this encounter  Medications  . azithromycin (ZITHROMAX) 250 MG tablet    Sig: Take 2 tablets on day 1 and 1 tablet daily after that    Dispense:  6 tablet    Refill:  0    Laroy Apple, MD [facility] 02/24/2017, 9:01 AM

## 2017-03-02 ENCOUNTER — Ambulatory Visit: Payer: Medicare Other | Admitting: Family Medicine

## 2017-03-05 IMAGING — CR DG CHEST 2V
2 series · 2 of 2 positions shown · non-contrast
Comparison: November 18, 2014

CLINICAL DATA: Shortness of Breath

EXAM:
CHEST  2 VIEW

[view not recorded (1 of 2)]
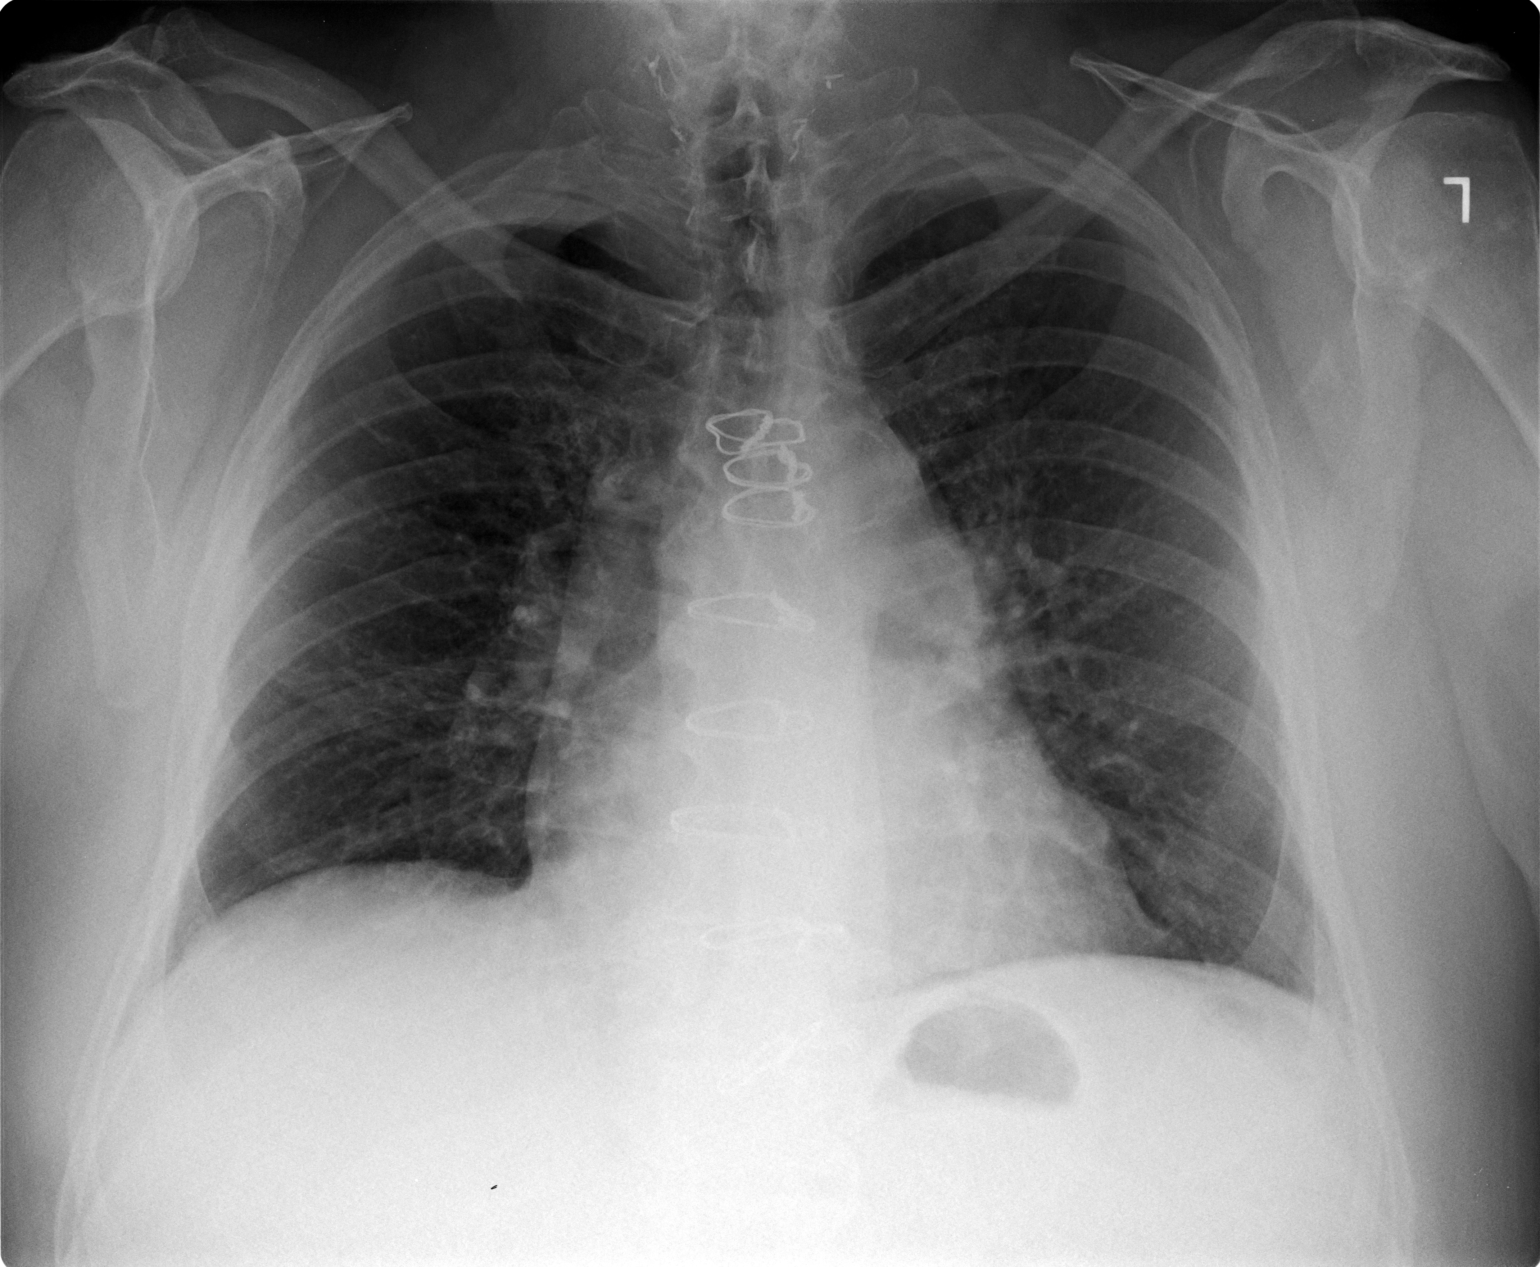

[view not recorded (2 of 2)]
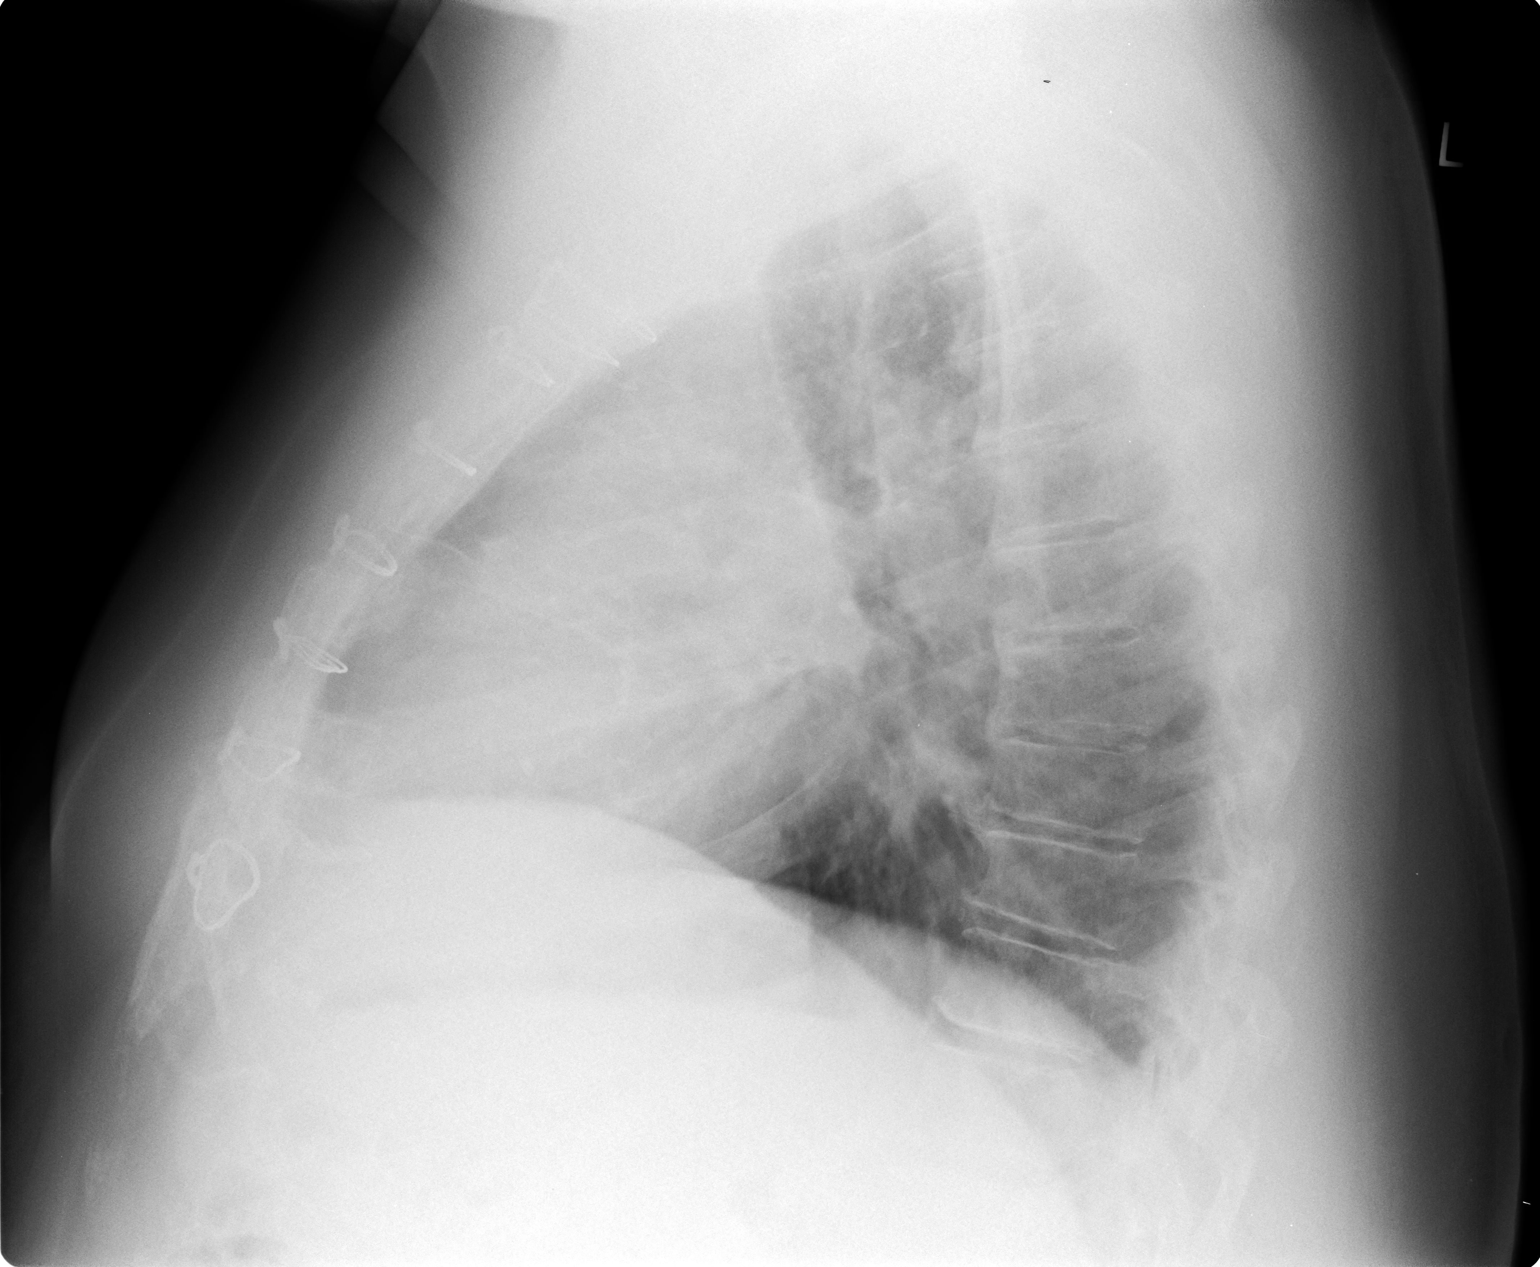

[2 of 2 positions shown; findings below may reference images not displayed]

FINDINGS: There is no edema or consolidation. Heart is upper normal in size
with pulmonary vascularity within normal limits. No adenopathy.
Patient is status post median sternotomy. There are surgical clips
in the cervicothoracic junction region midline. There is
degenerative change in the thoracic spine.
IMPRESSION: No edema or consolidation.

## 2017-03-14 ENCOUNTER — Ambulatory Visit (INDEPENDENT_AMBULATORY_CARE_PROVIDER_SITE_OTHER): Payer: Medicare Other | Admitting: *Deleted

## 2017-03-14 DIAGNOSIS — I442 Atrioventricular block, complete: Secondary | ICD-10-CM

## 2017-03-14 NOTE — Progress Notes (Signed)
Remote pacemaker transmission.   

## 2017-03-15 LAB — CUP PACEART REMOTE DEVICE CHECK
Brady Statistic AP VP Percent: 0.96 %
Brady Statistic AS VP Percent: 98.93 %
Brady Statistic RA Percent Paced: 0.96 %
Brady Statistic RV Percent Paced: 99.88 %
Date Time Interrogation Session: 20190219062330
Implantable Lead Implant Date: 20180521
Implantable Lead Location: 753859
Implantable Lead Location: 753859
Implantable Lead Model: 3830
Implantable Lead Model: 5076
Lead Channel Impedance Value: 323 Ohm
Lead Channel Pacing Threshold Pulse Width: 0.4 ms
Lead Channel Sensing Intrinsic Amplitude: 1.125 mV
Lead Channel Sensing Intrinsic Amplitude: 1.125 mV
Lead Channel Sensing Intrinsic Amplitude: 18 mV
Lead Channel Setting Pacing Amplitude: 1.75 V
Lead Channel Setting Pacing Amplitude: 2.5 V
Lead Channel Setting Pacing Pulse Width: 0.4 ms
MDC IDC LEAD IMPLANT DT: 20180521
MDC IDC MSMT BATTERY REMAINING LONGEVITY: 127 mo
MDC IDC MSMT BATTERY VOLTAGE: 3.07 V
MDC IDC MSMT LEADCHNL RA IMPEDANCE VALUE: 304 Ohm
MDC IDC MSMT LEADCHNL RA IMPEDANCE VALUE: 437 Ohm
MDC IDC MSMT LEADCHNL RA PACING THRESHOLD AMPLITUDE: 0.875 V
MDC IDC MSMT LEADCHNL RV IMPEDANCE VALUE: 456 Ohm
MDC IDC MSMT LEADCHNL RV PACING THRESHOLD AMPLITUDE: 0.75 V
MDC IDC MSMT LEADCHNL RV PACING THRESHOLD PULSEWIDTH: 0.4 ms
MDC IDC MSMT LEADCHNL RV SENSING INTR AMPL: 18 mV
MDC IDC PG IMPLANT DT: 20180521
MDC IDC SET LEADCHNL RV SENSING SENSITIVITY: 2 mV
MDC IDC STAT BRADY AP VS PERCENT: 0 %
MDC IDC STAT BRADY AS VS PERCENT: 0.11 %

## 2017-03-15 IMAGING — CT CT HEAD W/O CM
2 series · 16 of 30 positions shown, 20 images · non-contrast
Comparison: None.

CLINICAL DATA: Inpatient with hallucinations.

EXAM:
CT HEAD WITHOUT CONTRAST
TECHNIQUE: Contiguous axial images were obtained from the base of the skull
through the vertex without intravenous contrast.

[Series 201: head w/o, idose (1) · axial · non-contrast · 0.44mm/px · z∈[+43,+168]mm · 13 of 31 slices shown, 17 images]
[im 3/31  brain]
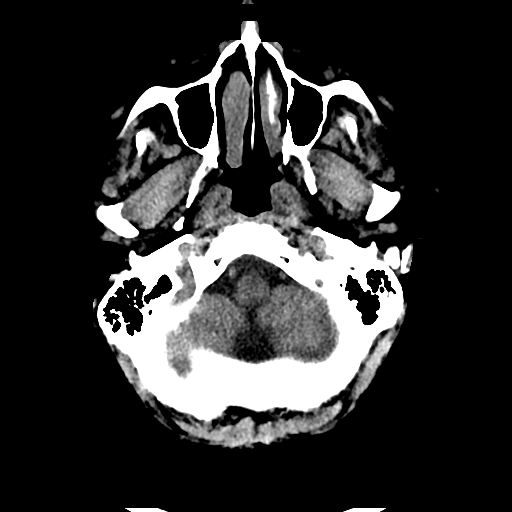
[im 3/31  bone]
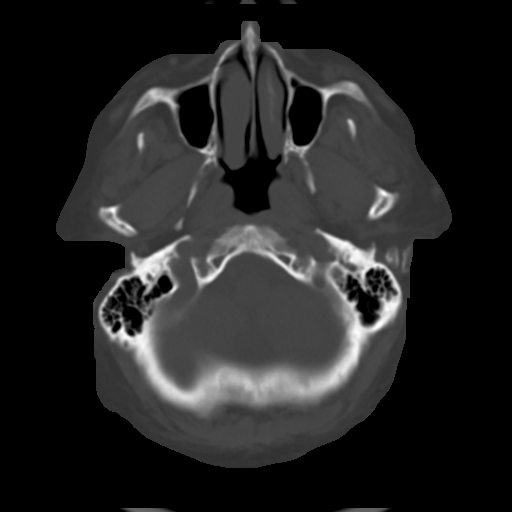
[im 5/31  brain]
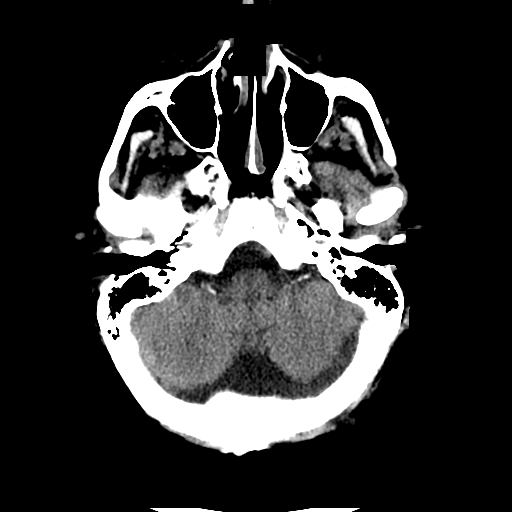
[im 7/31  brain]
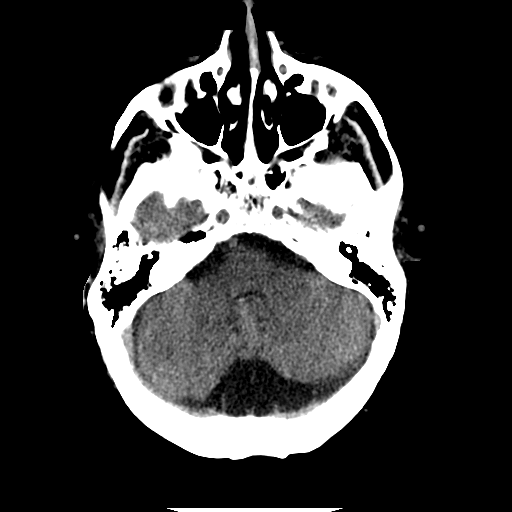
[im 9/31  brain]
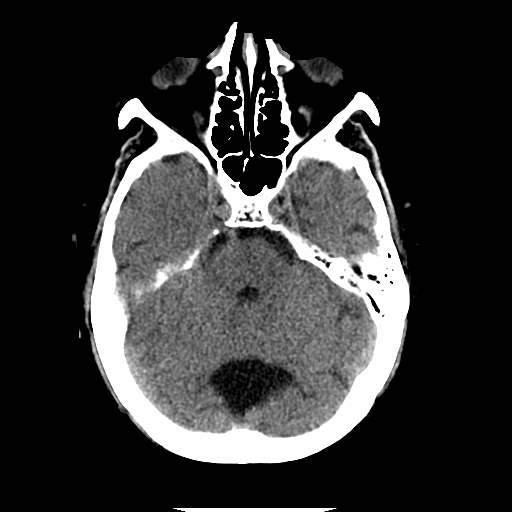
[im 11/31  brain]
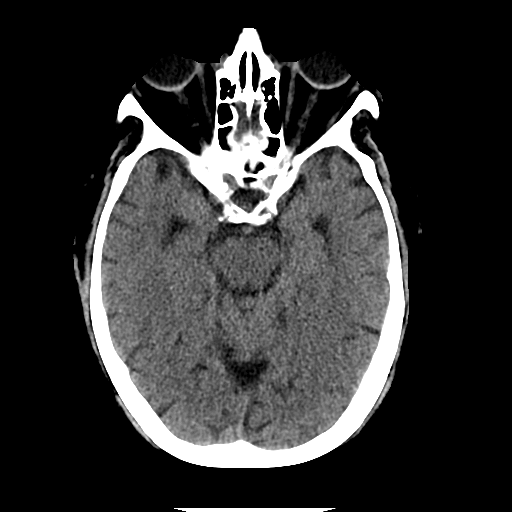
[im 11/31  bone]
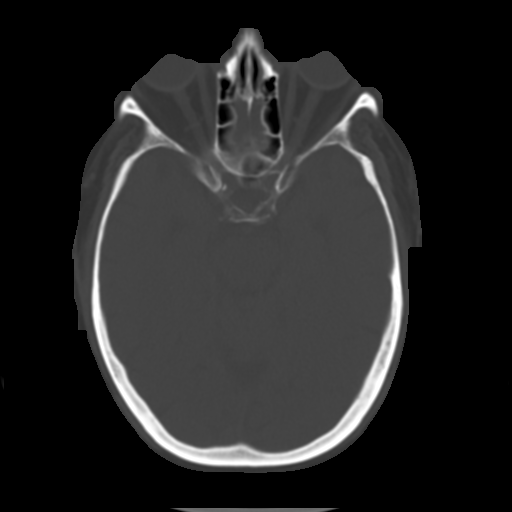
[im 13/31  brain]
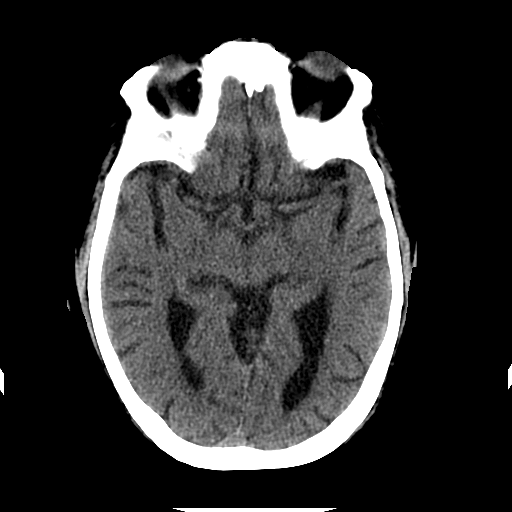
[im 16/31  brain]
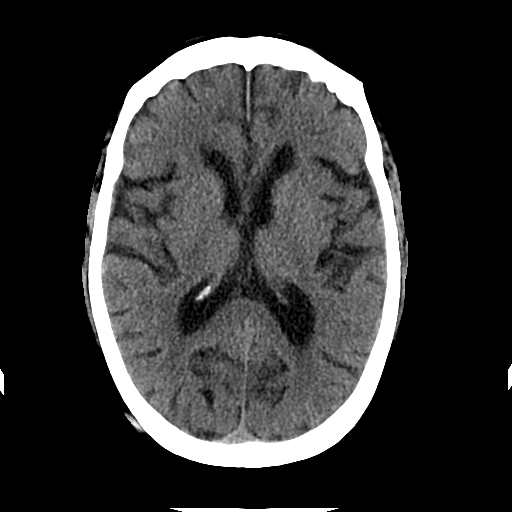
[im 18/31  brain]
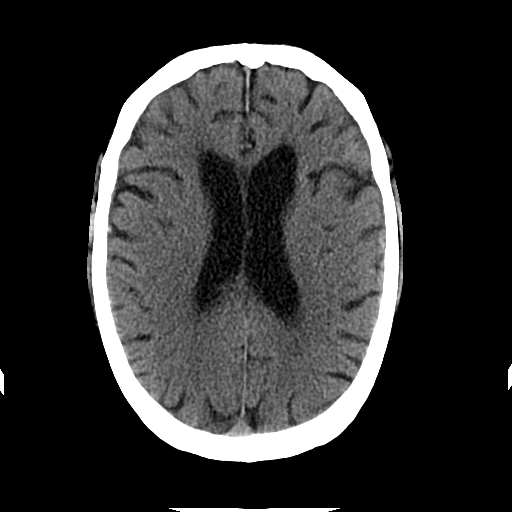
[im 20/31  brain]
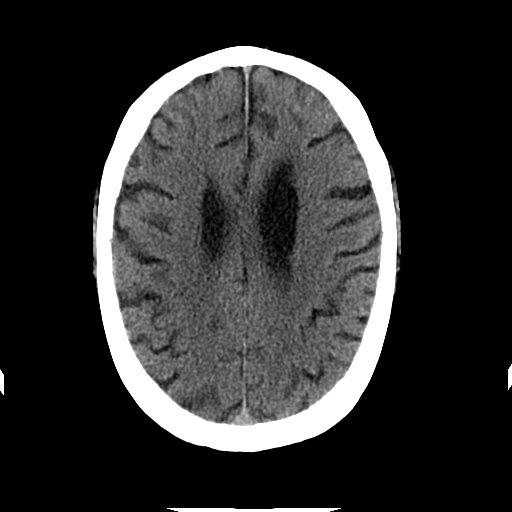
[im 20/31  bone]
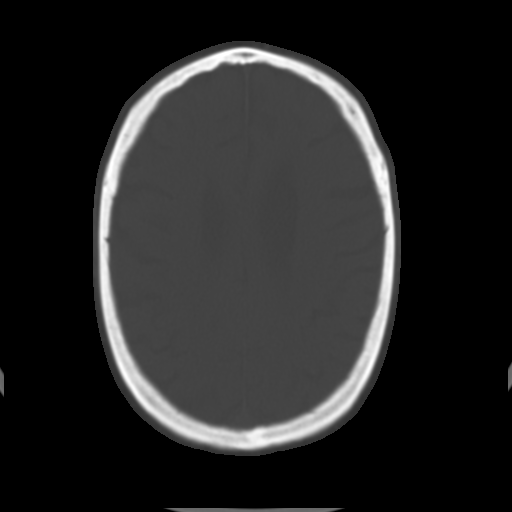
[im 22/31  brain]
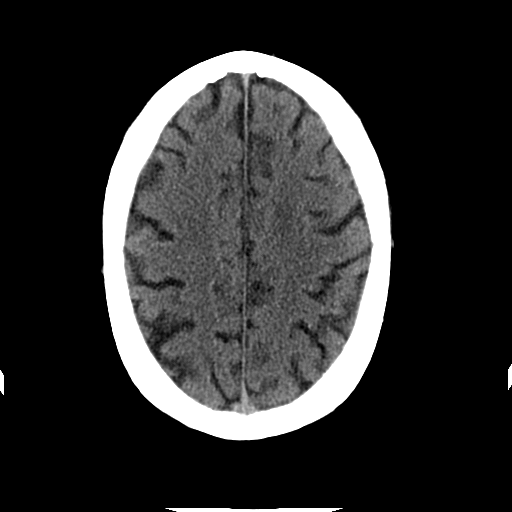
[im 24/31  brain]
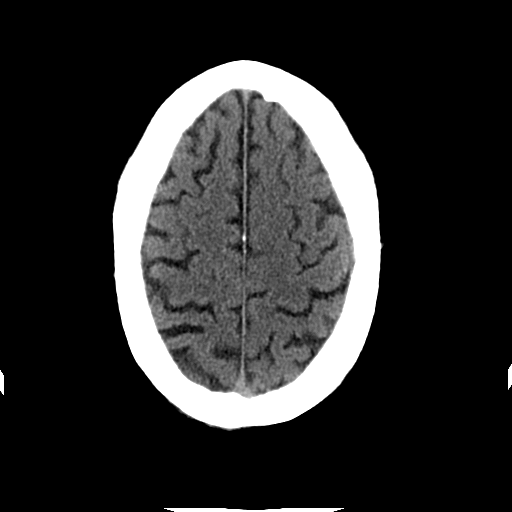
[im 26/31  brain]
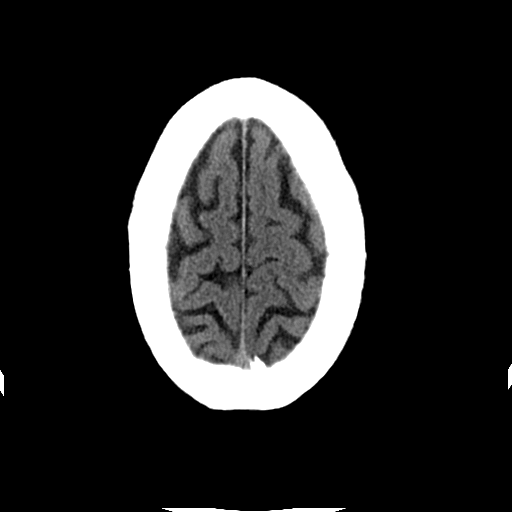
[im 28/31  brain]
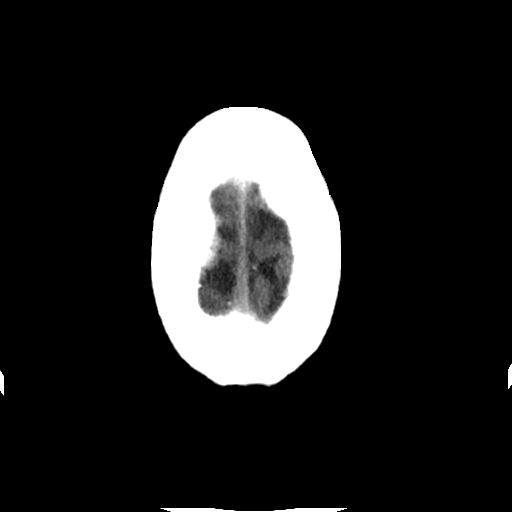
[im 28/31  bone]
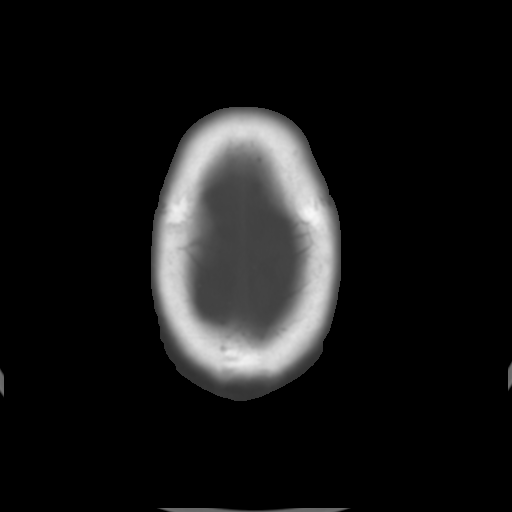

[Series 202: head w/o bone, idose (1) · axial · non-contrast · 0.44mm/px · z∈[+43,+83]mm · 3 of 31 slices shown]
[im 3/31  bone]
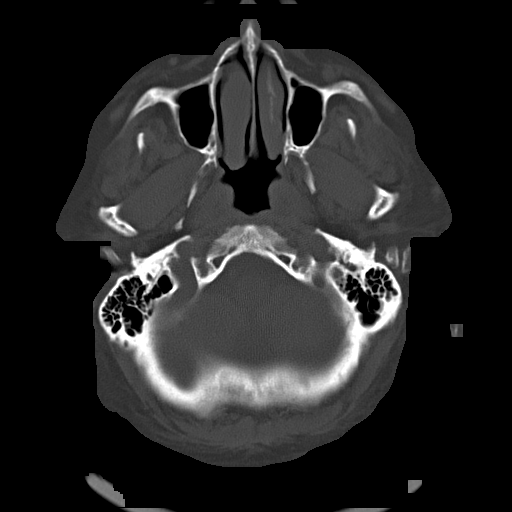
[im 7/31  bone]
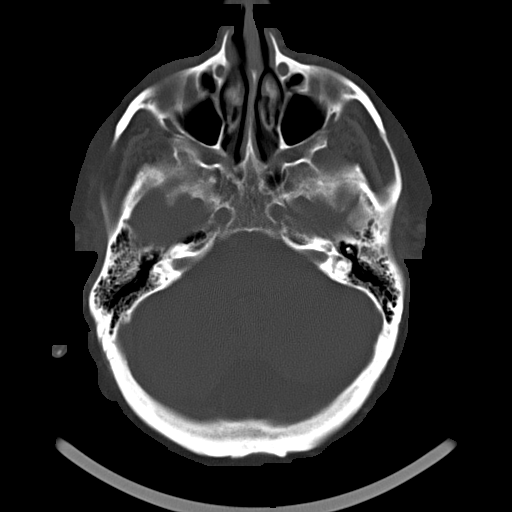
[im 11/31  bone]
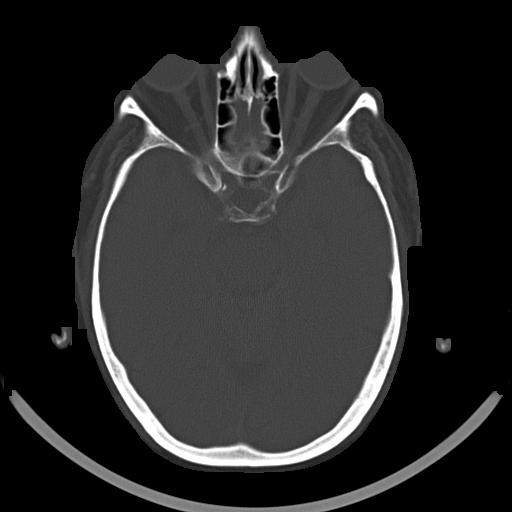

[16 of 30 positions shown; findings below may reference images not displayed]

FINDINGS: No evidence of parenchymal hemorrhage or extra-axial fluid
collection. No mass lesion, mass effect, or midline shift.

No CT evidence of acute infarction. Mild intracranial
atherosclerosis. Nonspecific subcortical and periventricular white
matter hypodensity, most in keeping with chronic small vessel
ischemic change.

Cerebral volume is age appropriate. No ventriculomegaly.

The visualized paranasal sinuses are essentially clear. The mastoid
air cells are unopacified. No evidence of calvarial fracture.
IMPRESSION: 1.  No evidence of acute intracranial abnormality.
2. Mild intracranial atherosclerosis and mild chronic small vessel
ischemic white matter change.

## 2017-03-16 ENCOUNTER — Encounter: Payer: Self-pay | Admitting: Cardiology

## 2017-03-22 DIAGNOSIS — E1140 Type 2 diabetes mellitus with diabetic neuropathy, unspecified: Secondary | ICD-10-CM | POA: Diagnosis not present

## 2017-03-22 DIAGNOSIS — L110 Acquired keratosis follicularis: Secondary | ICD-10-CM | POA: Diagnosis not present

## 2017-03-22 DIAGNOSIS — L609 Nail disorder, unspecified: Secondary | ICD-10-CM | POA: Diagnosis not present

## 2017-03-22 DIAGNOSIS — E1151 Type 2 diabetes mellitus with diabetic peripheral angiopathy without gangrene: Secondary | ICD-10-CM | POA: Diagnosis not present

## 2017-03-28 ENCOUNTER — Ambulatory Visit (INDEPENDENT_AMBULATORY_CARE_PROVIDER_SITE_OTHER): Payer: Medicare Other | Admitting: Endocrinology

## 2017-03-28 ENCOUNTER — Encounter: Payer: Self-pay | Admitting: Endocrinology

## 2017-03-28 VITALS — BP 112/68 | HR 89 | Temp 97.8°F | Wt 263.0 lb

## 2017-03-28 DIAGNOSIS — H40013 Open angle with borderline findings, low risk, bilateral: Secondary | ICD-10-CM | POA: Diagnosis not present

## 2017-03-28 DIAGNOSIS — E039 Hypothyroidism, unspecified: Secondary | ICD-10-CM | POA: Diagnosis not present

## 2017-03-28 DIAGNOSIS — H43393 Other vitreous opacities, bilateral: Secondary | ICD-10-CM | POA: Diagnosis not present

## 2017-03-28 DIAGNOSIS — H35033 Hypertensive retinopathy, bilateral: Secondary | ICD-10-CM | POA: Diagnosis not present

## 2017-03-28 DIAGNOSIS — E113293 Type 2 diabetes mellitus with mild nonproliferative diabetic retinopathy without macular edema, bilateral: Secondary | ICD-10-CM | POA: Diagnosis not present

## 2017-03-28 LAB — T4, FREE: Free T4: 1.22 ng/dL (ref 0.60–1.60)

## 2017-03-28 LAB — TSH: TSH: 0.45 u[IU]/mL (ref 0.35–4.50)

## 2017-03-28 LAB — HM DIABETES EYE EXAM

## 2017-03-28 NOTE — Patient Instructions (Addendum)
blood tests are requested for you today.  We'll let you know about the results. I would be happy to see you back here as needed    Levothyroxine tablets What is this medicine? LEVOTHYROXINE (lee voe thye ROX een) is a thyroid hormone. This medicine can improve symptoms of thyroid deficiency such as slow speech, lack of energy, weight gain, hair loss, dry skin, and feeling cold. It also helps to treat goiter (an enlarged thyroid gland). It is also used to treat some kinds of thyroid cancer along with surgery and other medicines. This medicine may be used for other purposes; ask your health care provider or pharmacist if you have questions. COMMON BRAND NAME(S): Estre, Levo-T, Levothroid, Levoxyl, Synthroid, Thyro-Tabs, Unithroid What should I tell my health care provider before I take this medicine? They need to know if you have any of these conditions: -angina -blood clotting problems -diabetes -dieting or on a weight loss program -fertility problems -heart disease -high levels of thyroid hormone -pituitary gland problem -previous heart attack -an unusual or allergic reaction to levothyroxine, thyroid hormones, other medicines, foods, dyes, or preservatives -pregnant or trying to get pregnant -breast-feeding How should I use this medicine? Take this medicine by mouth with plenty of water. It is best to take on an empty stomach, at least 30 minutes before or 2 hours after food. Follow the directions on the prescription label. Take at the same time each day. Do not take your medicine more often than directed. Contact your pediatrician regarding the use of this medicine in children. While this drug may be prescribed for children and infants as young as a few days of age for selected conditions, precautions do apply. For infants, you may crush the tablet and place in a small amount of (5-10 ml or 1 to 2 teaspoonfuls) of water, breast milk, or non-soy based infant formula. Do not mix with  soy-based infant formula. Give as directed. Overdosage: If you think you have taken too much of this medicine contact a poison control center or emergency room at once. NOTE: This medicine is only for you. Do not share this medicine with others. What if I miss a dose? If you miss a dose, take it as soon as you can. If it is almost time for your next dose, take only that dose. Do not take double or extra doses. What may interact with this medicine? -amiodarone -antacids -anti-thyroid medicines -calcium supplements -carbamazepine -cholestyramine -colestipol -digoxin -male hormones, including contraceptive or birth control pills -iron supplements -ketamine -liquid nutrition products like Ensure -medicines for colds and breathing difficulties -medicines for diabetes -medicines for mental depression -medicines or herbals used to decrease weight or appetite -phenobarbital or other barbiturate medications -phenytoin -prednisone or other corticosteroids -rifabutin -rifampin -soy isoflavones -sucralfate -theophylline -warfarin This list may not describe all possible interactions. Give your health care provider a list of all the medicines, herbs, non-prescription drugs, or dietary supplements you use. Also tell them if you smoke, drink alcohol, or use illegal drugs. Some items may interact with your medicine. What should I watch for while using this medicine? Be sure to take this medicine with plenty of fluids. Some tablets may cause choking, gagging, or difficulty swallowing from the tablet getting stuck in your throat. Most of these problems disappear if the medicine is taken with the right amount of water or other fluids. Do not switch brands of this medicine unless your health care professional agrees with the change. Ask questions if you are uncertain. You  will need regular exams and occasional blood tests to check the response to treatment. If you are receiving this medicine for an  underactive thyroid, it may be several weeks before you notice an improvement. Check with your doctor or health care professional if your symptoms do not improve. It may be necessary for you to take this medicine for the rest of your life. Do not stop using this medicine unless your doctor or health care professional advises you to. This medicine can affect blood sugar levels. If you have diabetes, check your blood sugar as directed. You may lose some of your hair when you first start treatment. With time, this usually corrects itself. If you are going to have surgery, tell your doctor or health care professional that you are taking this medicine. What side effects may I notice from receiving this medicine? Side effects that you should report to your doctor or health care professional as soon as possible: -allergic reactions like skin rash, itching or hives, swelling of the face, lips, or tongue -chest pain -excessive sweating or intolerance to heat -fast or irregular heartbeat -nervousness -skin rash or hives -swelling of ankles, feet, or legs -tremors Side effects that usually do not require medical attention (report to your doctor or health care professional if they continue or are bothersome): -changes in appetite -changes in menstrual periods -diarrhea -hair loss -headache -trouble sleeping -weight loss This list may not describe all possible side effects. Call your doctor for medical advice about side effects. You may report side effects to FDA at 1-800-FDA-1088. Where should I keep my medicine? Keep out of the reach of children. Store at room temperature between 15 and 30 degrees C (59 and 86 degrees F). Protect from light and moisture. Keep container tightly closed. Throw away any unused medicine after the expiration date. NOTE: This sheet is a summary. It may not cover all possible information. If you have questions about this medicine, talk to your doctor, pharmacist, or health  care provider.  2018 Elsevier/Gold Standard (2008-04-18 14:28:07)

## 2017-03-28 NOTE — Progress Notes (Signed)
Subjective:    Patient ID: [name], male    DOB: [birth year], 72 y.o.   MRN: [id number]  HPI Pt is referred by for hypothyroidism.  Pt had thyroidect many years ago, for multinodular goiter and hyperthyroidism.  He has been on prescribed thyroid hormone therapy since then.  He has never taken kelp or any other type of non-prescribed thyroid product.  He has never had XRT to the neck.  He has never been on amiodarone or lithium.   He has 1 month of moderate depression, but no assoc SI or HI.  Pt says current dosage of levothyroxine has not changed in a few years.  Pt wants to know if sxs are thyroid-related.  Past Medical History:  Diagnosis Date  . Arthritis   . BPH (benign prostatic hypertrophy) 04/15/2010  . Cataract   . Colon polyps   . DEPRESSION 01/22/2009  . Heart valve replaced   . HYPERCHOLESTEROLEMIA 01/22/2009  . HYPERTENSION 01/22/2009   Dr. [name]  . 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 Morbid obesity ([place])   . Tremors of nervous system    ?ptsd  . Ulcer     Past Surgical History:  Procedure Laterality Date  . 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 APPENDECTOMY    . bilateral cataract surg    . COLONOSCOPY  04/24/2007   [name]: normal  . COLONOSCOPY WITH ESOPHAGOGASTRODUODENOSCOPY (EGD) N/A 04/05/2012   Procedure: COLONOSCOPY WITH ESOPHAGOGASTRODUODENOSCOPY (EGD);  Surgeon: Daneil Dolin, MD;  Location: [facility];  Service: Endoscopy;  Laterality: N/A;  10;15  . DENTAL SURGERY  05/2004   Dental extractions  . EYE SURGERY Bilateral    cataract  . HEEL SPUR SURGERY Bilateral    resection of heel spur  . KNEE ARTHROSCOPY WITH LATERAL MENISECTOMY Right 04/04/2014   Procedure: KNEE ARTHROSCOPY WITH LATERAL MENISECTOMY;  Surgeon: Carole Civil, MD;  Location: [facility];  Service: Orthopedics;  Laterality: Right;  . PACEMAKER IMPLANT N/A 06/13/2016   Procedure: Pacemaker Implant- Dual  Chamber;  Surgeon: Evans Lance, MD;  Location: [facility];  Service: Cardiovascular;  Laterality: N/A;  . PACEMAKER INSERTION  2018  . THYROIDECTOMY  03/21/2013   DR [name]  . THYROIDECTOMY N/A 03/21/2013   Procedure: THYROIDECTOMY;  Surgeon: [name], MD;  Location: [facility];  Service: General;  Laterality: N/A;  . TONSILLECTOMY      Social History   Socioeconomic History  . Marital status: Single    Spouse name: Not on file  . Number of children: 1  . Years of education: HS  . Highest education level: Not on file  Social Needs  . Financial resource strain: Not on file  . Food insecurity - worry: Not on file  . Food insecurity - inability: Not on file  . Transportation needs - medical: Not on file  . Transportation needs - non-medical: Not on file  Occupational History  . Occupation: Administrator, retired    Fish farm manager: RETIRED  Tobacco Use  . Smoking status: Former Smoker    Packs/day: 0.00    Years: 12.00    Pack years: 0.00    Types: Cigarettes    Last attempt to quit: 09/20/1961    Years since quitting: 55.5  . Smokeless tobacco: Never Used  Substance and Sexual Activity  . Alcohol use: No  . Drug use: No  . Sexual activity: No  Comment: Has not smoked in 20 years  Other Topics Concern  . Not on file  Social History Narrative   Lives alone.   Quit smoking approximately 28 years ago.   Long haul truck driver, lives in [place].    Current Outpatient Medications on File Prior to Visit  Medication Sig Dispense Refill  . acetaminophen (TYLENOL) 500 MG tablet Take 1,000 mg by mouth every 6 (six) hours as needed (pain).    Marland Kitchen apixaban (ELIQUIS) 5 MG TABS tablet Take 1 tablet (5 mg total) by mouth 2 (two) times daily. 180 tablet 3  . cholecalciferol (VITAMIN D) 1000 units tablet Take 1,000 Units by mouth 2 (two) times daily.    . furosemide (LASIX) 20 MG tablet Take 20 mg by mouth daily.    Marland Kitchen glucose blood test strip 1 strip by Does not apply route 3  (three) times daily. Uses true metrix strips (diabetic club)  12  . insulin detemir (LEVEMIR) 100 UNIT/ML injection Inject 0.47-0.48 mLs (47-48 Units total) into the skin 2 (two) times daily. 10 mL   . Insulin Pen Needle (B-D ULTRAFINE III SHORT PEN) 31G X 8 MM MISC Use to inject Victoza qd 100 each 5  . levothyroxine (SYNTHROID, LEVOTHROID) 200 MCG tablet Take 1 tablet (200 mcg total) by mouth daily before breakfast. 90 tablet 3  . metFORMIN (GLUCOPHAGE) 1000 MG tablet TAKE 1 TABLET BY MOUTH TWICE DAILY WITH A MEAL. 180 tablet 0  . Multiple Vitamins-Minerals (MENS MULTI VITAMIN & MINERAL PO) Take 1 tablet by mouth daily.      . Omega-3 Fatty Acids (FISH OIL) 1200 MG CAPS Take 1,200-2,400 mg by mouth 2 (two) times daily. Take 1 capsule (1200 mg) by mouth daily at noon and 2 capsules (2400 mg) daily at bedtime    . simvastatin (ZOCOR) 80 MG tablet Take 40 mg by mouth at bedtime.     . tamsulosin (FLOMAX) 0.4 MG CAPS capsule Take 0.4 mg by mouth at bedtime.    Marland Kitchen VICTOZA 18 MG/3ML SOPN INJECT 1.2 MG INTO THE SKIN DAILY. START WITH 0.6 MG DAILY FOR 1 WEEK THEN INCREASE TO 1.2 MG 6 mL 2   No current facility-administered medications on file prior to visit.     Allergies  Allergen Reactions  . Phenothiazines Anaphylaxis  . Actos [Pioglitazone] Swelling    Family History  Problem Relation Age of Onset  . Heart disease Father   . Congestive Heart Failure Father   . Arthritis Father   . Diabetes Unknown   . Benign prostatic hyperplasia Brother     BP 112/68 (BP Location: Left Arm, Patient Position: Sitting, Cuff Size: Large)   Pulse 89   Temp 97.8 F (36.6 C) (Oral)   Wt 263 lb (119.3 kg)   SpO2 97%   BMI 34.70 kg/m     Review of Systems denies muscle cramps, sob, memory loss, constipation, numbness, blurry vision, cold intolerance, myalgias, dry skin, easy bruising, and syncope.  He has lost a few lbs.  He has rhinorrhea.      Objective:   Physical Exam VS: see vs page GEN: no  distress HEAD: head: no deformity eyes: no periorbital swelling, no proptosis external nose and ears are normal mouth: no lesion seen NECK: a healed scar is present.  I do not appreciate a nodule in the thyroid or elsewhere in the neck CHEST WALL: no deformity.  Pacemaker is noted.  Old healed surgical scar (median sternotomy).  LUNGS: clear to auscultation  CV: reg rate and rhythm; soft systolic murmur.  ABD: abdomen is soft, nontender.  no hepatosplenomegaly.  not distended.  no hernia MUSCULOSKELETAL: muscle bulk and strength are grossly normal.  no obvious joint swelling.  gait is normal and steady EXTEMITIES: no deformity.  no edema PULSES: no carotid bruit NEURO:  cn 2-12 grossly intact.   readily moves all 4's.  sensation is intact to touch on all 4's SKIN:  Normal texture and temperature.  No rash or suspicious lesion is visible.    NODES:  None palpable at the neck.   PSYCH: alert, well-oriented.  Does not appear anxious nor depressed.     Lab Results  Component Value Date   TSH 0.222 (L) 01/31/2017   T4TOTAL 7.3 04/28/2014   I have reviewed outside records, and summarized: Pt was noted to have hypothyroidism, and referred here.  Synthroid was recently reduced, due to suppressed TSH.  He reported multiple sxs     Assessment & Plan:  Postsurgical hypothyroidism, due for recheck. Depression: I told pt this is not thyroid-related.  Patient Instructions  blood tests are requested for you today.  We'll let you know about the results. I would be happy to see you back here as needed    Levothyroxine tablets What is this medicine? LEVOTHYROXINE (lee voe thye ROX een) is a thyroid hormone. This medicine can improve symptoms of thyroid deficiency such as slow speech, lack of energy, weight gain, hair loss, dry skin, and feeling cold. It also helps to treat goiter (an enlarged thyroid gland). It is also used to treat some kinds of thyroid cancer along with surgery and other  medicines. This medicine may be used for other purposes; ask your health care provider or pharmacist if you have questions. COMMON BRAND NAME(S): Estre, Levo-T, Levothroid, Levoxyl, Synthroid, Thyro-Tabs, Unithroid What should I tell my health care provider before I take this medicine? They need to know if you have any of these conditions: -angina -blood clotting problems -diabetes -dieting or on a weight loss program -fertility problems -heart disease -high levels of thyroid hormone -pituitary gland problem -previous heart attack -an unusual or allergic reaction to levothyroxine, thyroid hormones, other medicines, foods, dyes, or preservatives -pregnant or trying to get pregnant -breast-feeding How should I use this medicine? Take this medicine by mouth with plenty of water. It is best to take on an empty stomach, at least 30 minutes before or 2 hours after food. Follow the directions on the prescription label. Take at the same time each day. Do not take your medicine more often than directed. Contact your pediatrician regarding the use of this medicine in children. While this drug may be prescribed for children and infants as young as a few days of age for selected conditions, precautions do apply. For infants, you may crush the tablet and place in a small amount of (5-10 ml or 1 to 2 teaspoonfuls) of water, breast milk, or non-soy based infant formula. Do not mix with soy-based infant formula. Give as directed. Overdosage: If you think you have taken too much of this medicine contact a poison control center or emergency room at once. NOTE: This medicine is only for you. Do not share this medicine with others. What if I miss a dose? If you miss a dose, take it as soon as you can. If it is almost time for your next dose, take only that dose. Do not take double or extra doses. What may interact with this medicine? -amiodarone -antacids -  anti-thyroid medicines -calcium  supplements -carbamazepine -cholestyramine -colestipol -digoxin -male hormones, including contraceptive or birth control pills -iron supplements -ketamine -liquid nutrition products like Ensure -medicines for colds and breathing difficulties -medicines for diabetes -medicines for mental depression -medicines or herbals used to decrease weight or appetite -phenobarbital or other barbiturate medications -phenytoin -prednisone or other corticosteroids -rifabutin -rifampin -soy isoflavones -sucralfate -theophylline -warfarin This list may not describe all possible interactions. Give your health care provider a list of all the medicines, herbs, non-prescription drugs, or dietary supplements you use. Also tell them if you smoke, drink alcohol, or use illegal drugs. Some items may interact with your medicine. What should I watch for while using this medicine? Be sure to take this medicine with plenty of fluids. Some tablets may cause choking, gagging, or difficulty swallowing from the tablet getting stuck in your throat. Most of these problems disappear if the medicine is taken with the right amount of water or other fluids. Do not switch brands of this medicine unless your health care professional agrees with the change. Ask questions if you are uncertain. You will need regular exams and occasional blood tests to check the response to treatment. If you are receiving this medicine for an underactive thyroid, it may be several weeks before you notice an improvement. Check with your doctor or health care professional if your symptoms do not improve. It may be necessary for you to take this medicine for the rest of your life. Do not stop using this medicine unless your doctor or health care professional advises you to. This medicine can affect blood sugar levels. If you have diabetes, check your blood sugar as directed. You may lose some of your hair when you first start treatment. With time,  this usually corrects itself. If you are going to have surgery, tell your doctor or health care professional that you are taking this medicine. What side effects may I notice from receiving this medicine? Side effects that you should report to your doctor or health care professional as soon as possible: -allergic reactions like skin rash, itching or hives, swelling of the face, lips, or tongue -chest pain -excessive sweating or intolerance to heat -fast or irregular heartbeat -nervousness -skin rash or hives -swelling of ankles, feet, or legs -tremors Side effects that usually do not require medical attention (report to your doctor or health care professional if they continue or are bothersome): -changes in appetite -changes in menstrual periods -diarrhea -hair loss -headache -trouble sleeping -weight loss This list may not describe all possible side effects. Call your doctor for medical advice about side effects. You may report side effects to FDA at 1-800-FDA-1088. Where should I keep my medicine? Keep out of the reach of children. Store at room temperature between 15 and 30 degrees C (59 and 86 degrees F). Protect from light and moisture. Keep container tightly closed. Throw away any unused medicine after the expiration date. NOTE: This sheet is a summary. It may not cover all possible information. If you have questions about this medicine, talk to your doctor, pharmacist, or health care provider.  2018 Elsevier/Gold Standard (2008-04-18 14:28:07)

## 2017-03-29 ENCOUNTER — Telehealth: Payer: Self-pay | Admitting: Emergency Medicine

## 2017-03-29 NOTE — Telephone Encounter (Signed)
Patient called wanting to know the results of his labs. Please give him a call back [phone] thanks.

## 2017-03-29 NOTE — Telephone Encounter (Signed)
I  Have called & notified patient of lab results.

## 2017-03-31 ENCOUNTER — Telehealth: Payer: Self-pay | Admitting: Family Medicine

## 2017-03-31 NOTE — Telephone Encounter (Signed)
Pt called [facility] He stated he would like a call back with lab results

## 2017-03-31 NOTE — Telephone Encounter (Signed)
Judson Roch called to make sure this message was old. Spoke with pt on 03/06 and pt did not have any further questions.

## 2017-03-31 NOTE — Telephone Encounter (Signed)
Pt called wanting nurse to call him regarding lab work [facility] number [phone]

## 2017-03-31 NOTE — Telephone Encounter (Signed)
I LVM that if patient still needed to discuss labs he could call back. I spoke with patient 03/29/17 regarding labs & he had no further questions at that time.

## 2017-04-04 ENCOUNTER — Ambulatory Visit: Payer: Medicare Other | Admitting: Family Medicine

## 2017-04-04 ENCOUNTER — Telehealth: Payer: Self-pay | Admitting: Family Medicine

## 2017-04-04 MED ORDER — SIMVASTATIN 80 MG PO TABS: 40.0000 mg | ORAL_TABLET | Freq: Every day | ORAL | 0 refills | Status: DC

## 2017-04-04 NOTE — Telephone Encounter (Signed)
Pt asked to talk to [facility] nurse

## 2017-04-04 NOTE — Telephone Encounter (Signed)
Patient needs 30 day supply of Simvastatin sent to [facility], medication sent, patient aware

## 2017-04-07 ENCOUNTER — Encounter: Payer: Self-pay | Admitting: Family Medicine

## 2017-04-07 ENCOUNTER — Ambulatory Visit (INDEPENDENT_AMBULATORY_CARE_PROVIDER_SITE_OTHER): Payer: Medicare Other | Admitting: Family Medicine

## 2017-04-07 VITALS — BP 128/72 | HR 85 | Temp 96.8°F | Ht 73.0 in | Wt 259.0 lb

## 2017-04-07 DIAGNOSIS — I5030 Unspecified diastolic (congestive) heart failure: Secondary | ICD-10-CM

## 2017-04-07 DIAGNOSIS — N3941 Urge incontinence: Secondary | ICD-10-CM

## 2017-04-07 DIAGNOSIS — E119 Type 2 diabetes mellitus without complications: Secondary | ICD-10-CM | POA: Diagnosis not present

## 2017-04-07 DIAGNOSIS — G479 Sleep disorder, unspecified: Secondary | ICD-10-CM

## 2017-04-07 LAB — BAYER DCA HB A1C WAIVED: HB A1C (BAYER DCA - WAIVED): 8.2 % — ABNORMAL HIGH (ref <7.0)

## 2017-04-07 MED ORDER — TRAZODONE HCL 100 MG PO TABS: 100.0000 mg | ORAL_TABLET | Freq: Every day | ORAL | 2 refills | Status: DC

## 2017-04-07 NOTE — Patient Instructions (Signed)
Great to see you!  Come back to see Dr. Gottschalk in 3 months 

## 2017-04-07 NOTE — Progress Notes (Signed)
   HPI  Patient presents today for follow-up chronic medical conditions.  Patient sees endocrinology for hypothyroidism.  Type 2 diabetes Previously barely controlled with A1c of 8.0. Patient reports fasting blood sugars ranging from 120-180. Denies hypoglycemia. Uses insulin, Victoza, metformin   CHF Diastolic CHF No swelling Breathing normally   Trouble sleeping Patient has trouble sleeping and some crying spells lately. Denies SI Would like to try trazodone instead of Remeron  He is requesting depends for urge incontinence  PMH: Smoking status noted ROS: Per HPI  Objective: BP 128/72   Pulse 85   Temp (!) 96.8 F (36 C) (Oral)   Ht '6\' 1"'$  (1.854 m)   Wt 259 lb (117.5 kg)   BMI 34.17 kg/m  Gen: NAD, alert, cooperative with exam HEENT: NCAT CV: RRR, good S1/S2, no murmur Resp: CTABL, no wheezes, non-labored Ext: No edema, warm Neuro: Alert and oriented, No gross deficits  Assessment and plan:  #Type 2 diabetes Previously borderline control, A1c pending. Continue current treatments May need to refer to endocrinology formally for diabetic treatment as well.   #Diastolic CHF Appears euvolemic Labs today Continue Lasix  #Urge incontinence Depends ordered  #Difficulty sleeping Patient has not improved well with melatonin Trial of trazodone Discussed and offered Remeron, however he would like to try trazodone first     Orders Placed This Encounter  Procedures  . CMP14+EGFR  . Lipid panel  . CBC with Differential/Platelet  . Bayer DCA Hb A1c Waived    Meds ordered this encounter  Medications  . traZODone (DESYREL) 100 MG tablet    Sig: Take 1 tablet (100 mg total) by mouth at bedtime.    Dispense:  30 tablet    Refill:  [place], MD [facility] 04/07/2017, 8:59 AM

## 2017-04-08 LAB — CMP14+EGFR
ALK PHOS: 80 IU/L (ref 39–117)
ALT: 18 IU/L (ref 0–44)
AST: 24 IU/L (ref 0–40)
Albumin/Globulin Ratio: 1.4 (ref 1.2–2.2)
Albumin: 4.2 g/dL (ref 3.5–4.8)
BILIRUBIN TOTAL: 0.4 mg/dL (ref 0.0–1.2)
BUN/Creatinine Ratio: 17 (ref 10–24)
BUN: 22 mg/dL (ref 8–27)
CHLORIDE: 101 mmol/L (ref 96–106)
CO2: 22 mmol/L (ref 20–29)
Calcium: 9.2 mg/dL (ref 8.6–10.2)
Creatinine, Ser: 1.31 mg/dL — ABNORMAL HIGH (ref 0.76–1.27)
GFR calc Af Amer: 62 mL/min/{1.73_m2} (ref >59)
GFR calc non Af Amer: 54 mL/min/{1.73_m2} — ABNORMAL LOW (ref >59)
GLUCOSE: 171 mg/dL — AB (ref 65–99)
Globulin, Total: 2.9 g/dL (ref 1.5–4.5)
Potassium: 4.8 mmol/L (ref 3.5–5.2)
Sodium: 140 mmol/L (ref 134–144)
Total Protein: 7.1 g/dL (ref 6.0–8.5)

## 2017-04-08 LAB — CBC WITH DIFFERENTIAL/PLATELET
Basophils Absolute: 0 10*3/uL (ref 0.0–0.2)
Basos: 0 %
EOS (ABSOLUTE): 0.4 10*3/uL (ref 0.0–0.4)
EOS: 4 %
HEMOGLOBIN: 13.9 g/dL (ref 13.0–17.7)
Hematocrit: 40.4 % (ref 37.5–51.0)
IMMATURE GRANS (ABS): 0 10*3/uL (ref 0.0–0.1)
IMMATURE GRANULOCYTES: 0 %
LYMPHS ABS: 2.4 10*3/uL (ref 0.7–3.1)
Lymphs: 26 %
MCH: 33.3 pg — ABNORMAL HIGH (ref 26.6–33.0)
MCHC: 34.4 g/dL (ref 31.5–35.7)
MCV: 97 fL (ref 79–97)
MONOCYTES: 8 %
Monocytes Absolute: 0.7 10*3/uL (ref 0.1–0.9)
NEUTROS PCT: 62 %
Neutrophils Absolute: 5.4 10*3/uL (ref 1.4–7.0)
Platelets: 130 10*3/uL — ABNORMAL LOW (ref 150–379)
RBC: 4.18 x10E6/uL (ref 4.14–5.80)
RDW: 15.1 % (ref 12.3–15.4)
WBC: 8.9 10*3/uL (ref 3.4–10.8)

## 2017-04-08 LAB — LIPID PANEL
CHOLESTEROL TOTAL: 137 mg/dL (ref 100–199)
Chol/HDL Ratio: 3 ratio (ref 0.0–5.0)
HDL: 45 mg/dL (ref >39)
LDL Calculated: 73 mg/dL (ref 0–99)
Triglycerides: 93 mg/dL (ref 0–149)
VLDL Cholesterol Cal: 19 mg/dL (ref 5–40)

## 2017-04-17 ENCOUNTER — Telehealth: Payer: Self-pay | Admitting: Family Medicine

## 2017-04-17 ENCOUNTER — Telehealth: Payer: Self-pay

## 2017-04-17 MED ORDER — FUROSEMIDE 20 MG PO TABS: 20.0000 mg | ORAL_TABLET | Freq: Every day | ORAL | 1 refills | Status: DC

## 2017-04-17 MED ORDER — LEVOTHYROXINE SODIUM 200 MCG PO TABS
200.0000 ug | ORAL_TABLET | Freq: Every day | ORAL | 1 refills | Status: DC
Start: 2017-04-17 — End: 2019-03-14

## 2017-04-17 NOTE — Telephone Encounter (Signed)
Pt with no improvement with sleep with 100 mg trazodone. Will increase to 200 mg.   Laroy Apple, MD [facility] 04/17/2017, 12:05 PM

## 2017-04-17 NOTE — Telephone Encounter (Signed)
Patient needing levothyroxine and lasix sent to [facility]. Only wanted a 30 day supply of each. Sent in and patient notified

## 2017-04-17 NOTE — Telephone Encounter (Signed)
Patient notified. Will let us know how it does

## 2017-04-17 NOTE — Telephone Encounter (Signed)
Spoke with patient. Needed rx rf. Sent to pharmacy per patients request

## 2017-04-17 NOTE — Telephone Encounter (Signed)
Patient states that trazadone is not helping him sleep. Would like to try something else. Please advise and route to [facility]

## 2017-04-27 ENCOUNTER — Telehealth: Payer: Self-pay | Admitting: Family Medicine

## 2017-05-01 ENCOUNTER — Encounter: Payer: Self-pay | Admitting: Family Medicine

## 2017-05-01 ENCOUNTER — Ambulatory Visit (INDEPENDENT_AMBULATORY_CARE_PROVIDER_SITE_OTHER): Payer: Medicare Other | Admitting: Family Medicine

## 2017-05-01 VITALS — BP 140/73 | HR 107 | Temp 97.1°F | Ht 73.0 in | Wt 260.0 lb

## 2017-05-01 DIAGNOSIS — N401 Enlarged prostate with lower urinary tract symptoms: Secondary | ICD-10-CM

## 2017-05-01 DIAGNOSIS — R350 Frequency of micturition: Secondary | ICD-10-CM | POA: Diagnosis not present

## 2017-05-01 DIAGNOSIS — N3001 Acute cystitis with hematuria: Secondary | ICD-10-CM

## 2017-05-01 DIAGNOSIS — R300 Dysuria: Secondary | ICD-10-CM | POA: Diagnosis not present

## 2017-05-01 DIAGNOSIS — J0100 Acute maxillary sinusitis, unspecified: Secondary | ICD-10-CM

## 2017-05-01 LAB — MICROSCOPIC EXAMINATION: RENAL EPITHEL UA: NONE SEEN /HPF

## 2017-05-01 LAB — URINALYSIS, COMPLETE
Bilirubin, UA: NEGATIVE
KETONES UA: NEGATIVE
NITRITE UA: NEGATIVE
SPEC GRAV UA: 1.015 (ref 1.005–1.030)
UUROB: 0.2 mg/dL (ref 0.2–1.0)
pH, UA: 7 (ref 5.0–7.5)

## 2017-05-01 MED ORDER — FLUTICASONE PROPIONATE 50 MCG/ACT NA SUSP: 2.0000 | Freq: Every day | NASAL | 6 refills | Status: DC

## 2017-05-01 MED ORDER — LEVOFLOXACIN 750 MG PO TABS: 750.0000 mg | ORAL_TABLET | Freq: Every day | ORAL | 0 refills | Status: DC

## 2017-05-01 MED ORDER — TAMSULOSIN HCL 0.4 MG PO CAPS: 0.8000 mg | ORAL_CAPSULE | Freq: Every day | ORAL | 1 refills | Status: DC

## 2017-05-01 NOTE — Patient Instructions (Signed)
Urinary Tract Infection, Adult  A urinary tract infection (UTI) is an infection of any part of the urinary tract, which includes the kidneys, ureters, bladder, and urethra. These organs make, store, and get rid of urine in the body. UTI can be a bladder infection (cystitis) or kidney infection (pyelonephritis).  What are the causes?  This infection may be caused by fungi, viruses, or bacteria. Bacteria are the most common cause of UTIs. This condition can also be caused by repeated incomplete emptying of the bladder during urination.  What increases the risk?  This condition is more likely to develop if:   You ignore your need to urinate or hold urine for long periods of time.   You do not empty your bladder completely during urination.   You wipe back to front after urinating or having a bowel movement, if you are male.   You are uncircumcised, if you are male.   You are constipated.   You have a urinary catheter that stays in place (indwelling).   You have a weak defense (immune) system.   You have a medical condition that affects your bowels, kidneys, or bladder.   You have diabetes.   You take antibiotic medicines frequently or for long periods of time, and the antibiotics no longer work well against certain types of infections (antibiotic resistance).   You take medicines that irritate your urinary tract.   You are exposed to chemicals that irritate your urinary tract.   You are male.    What are the signs or symptoms?  Symptoms of this condition include:   Fever.   Frequent urination or passing small amounts of urine frequently.   Needing to urinate urgently.   Pain or burning with urination.   Urine that smells bad or unusual.   Cloudy urine.   Pain in the lower abdomen or back.   Trouble urinating.   Blood in the urine.   Vomiting or being less hungry than normal.   Diarrhea or abdominal pain.   Vaginal discharge, if you are male.    How is this diagnosed?  This condition is  diagnosed with a medical history and physical exam. You will also need to provide a urine sample to test your urine. Other tests may be done, including:   Blood tests.   Sexually transmitted disease (STD) testing.    If you have had more than one UTI, a cystoscopy or imaging studies may be done to determine the cause of the infections.  How is this treated?  Treatment for this condition often includes a combination of two or more of the following:   Antibiotic medicine.   Other medicines to treat less common causes of UTI.   Over-the-counter medicines to treat pain.   Drinking enough water to stay hydrated.    Follow these instructions at home:   Take over-the-counter and prescription medicines only as told by your health care provider.   If you were prescribed an antibiotic, take it as told by your health care provider. Do not stop taking the antibiotic even if you start to feel better.   Avoid alcohol, caffeine, tea, and carbonated beverages. They can irritate your bladder.   Drink enough fluid to keep your urine clear or pale yellow.   Keep all follow-up visits as told by your health care provider. This is important.   Make sure to:  ? Empty your bladder often and completely. Do not hold urine for long periods of time.  ?   Empty your bladder before and after sex.  ? Wipe from front to back after a bowel movement if you are male. Use each tissue one time when you wipe.  Contact a health care provider if:   You have back pain.   You have a fever.   You feel nauseous or vomit.   Your symptoms do not get better after 3 days.   Your symptoms go away and then return.  Get help right away if:   You have severe back pain or lower abdominal pain.   You are vomiting and cannot keep down any medicines or water.  This information is not intended to replace advice given to you by your health care provider. Make sure you discuss any questions you have with your health care provider.  Document Released:  10/20/2004 Document Revised: 06/24/2015 Document Reviewed: 12/01/2014  Elsevier Interactive Patient Education  2018 Elsevier Inc.

## 2017-05-01 NOTE — Progress Notes (Signed)
   HPI  Patient presents today concern for UTI.  Patient explains he has had 2-3 days of dysuria. No fever, chills, sweats. He has some right-sided low back pain.  Patient states that he has been on Flomax for quite some time, he is done better after a procedure with urology at the [place]. He takes 1 capsule of Flomax daily. Patient has 3 episodes of nocturia per night.  Patient also states that he has had persistent cough and right-sided facial pain and pressure for about 3-4 weeks. He has frequent throat clearing and feels like his nose is running most of the time.  He states that trazodone was not helpful for sleep, he also had thick saliva due to the medication.   PMH: Smoking status noted ROS: Per HPI  Objective: BP 140/73   Pulse (!) 107   Temp (!) 97.1 F (36.2 C) (Oral)   Ht 6\' 1"  (1.854 m)   Wt 260 lb (117.9 kg)   BMI 34.30 kg/m  Gen: NAD, alert, cooperative with exam HEENT: NCAT, TMs normal bilaterally, oropharynx moist and clear, tenderness to palpation of the right-sided maxillary sinus CV: RRR, good S1/S2, no murmur Resp: CTABL, no wheezes, non-labored Abd: SNTND, BS present, no guarding or organomegaly no suprapubic tenderness, no CVA tenderness Ext: No edema, warm Neuro: Alert and oriented, No gross deficits  Assessment and plan:  #UTI Complicated given male, culture pending Treat with Levaquin, this would also cover for sinusitis  #Maxillary sinusitis Treat with Levaquin   #BPH Possibly causing UTI Patient does not have any urinary obstruction He does have uncontrolled nocturia Titrate Flomax to 0.8 mg daily   Orders Placed This Encounter  Procedures  . Urine Culture  . Urinalysis, Complete    Meds ordered this encounter  Medications  . tamsulosin (FLOMAX) 0.4 MG CAPS capsule    Sig: Take 2 capsules (0.8 mg total) by mouth at bedtime.    Dispense:  180 capsule    Refill:  1  . levofloxacin (LEVAQUIN) 750 MG tablet    Sig: Take 1 tablet  (750 mg total) by mouth daily.    Dispense:  10 tablet    Refill:  0  . fluticasone (FLONASE) 50 MCG/ACT nasal spray    Sig: Place 2 sprays into both nostrils daily.    Dispense:  16 g    Refill:  [place], MD [facility] 05/01/2017, 11:36 AM

## 2017-05-05 LAB — URINE CULTURE

## 2017-05-07 ENCOUNTER — Other Ambulatory Visit: Payer: Self-pay

## 2017-05-07 ENCOUNTER — Emergency Department (HOSPITAL_COMMUNITY)
Admission: EM | Admit: 2017-05-07 | Discharge: 2017-05-07 | Disposition: A | Discharge status: Home / Self Care | Payer: Medicare Other | Attending: Emergency Medicine | Admitting: Emergency Medicine

## 2017-05-07 ENCOUNTER — Encounter (HOSPITAL_COMMUNITY): Payer: Self-pay | Admitting: Emergency Medicine

## 2017-05-07 DIAGNOSIS — R739 Hyperglycemia, unspecified: Secondary | ICD-10-CM

## 2017-05-07 DIAGNOSIS — R531 Weakness: Secondary | ICD-10-CM | POA: Diagnosis not present

## 2017-05-07 DIAGNOSIS — E039 Hypothyroidism, unspecified: Secondary | ICD-10-CM | POA: Diagnosis not present

## 2017-05-07 DIAGNOSIS — Z87891 Personal history of nicotine dependence: Secondary | ICD-10-CM | POA: Diagnosis not present

## 2017-05-07 DIAGNOSIS — Z79899 Other long term (current) drug therapy: Secondary | ICD-10-CM | POA: Insufficient documentation

## 2017-05-07 DIAGNOSIS — Z794 Long term (current) use of insulin: Secondary | ICD-10-CM | POA: Insufficient documentation

## 2017-05-07 DIAGNOSIS — I5030 Unspecified diastolic (congestive) heart failure: Secondary | ICD-10-CM | POA: Insufficient documentation

## 2017-05-07 DIAGNOSIS — I110 Hypertensive heart disease with heart failure: Secondary | ICD-10-CM | POA: Diagnosis not present

## 2017-05-07 DIAGNOSIS — Z950 Presence of cardiac pacemaker: Secondary | ICD-10-CM | POA: Diagnosis not present

## 2017-05-07 DIAGNOSIS — E1165 Type 2 diabetes mellitus with hyperglycemia: Secondary | ICD-10-CM | POA: Diagnosis not present

## 2017-05-07 LAB — URINALYSIS, ROUTINE W REFLEX MICROSCOPIC
BILIRUBIN URINE: NEGATIVE
Glucose, UA: >=500 mg/dL — AB
Ketones, ur: NEGATIVE mg/dL
Nitrite: NEGATIVE
PH: 6 (ref 5.0–8.0)
Protein, ur: NEGATIVE mg/dL
SPECIFIC GRAVITY, URINE: 1.009 (ref 1.005–1.030)

## 2017-05-07 LAB — CBG MONITORING, ED
GLUCOSE-CAPILLARY: 252 mg/dL — AB (ref 65–99)
GLUCOSE-CAPILLARY: 166 mg/dL — AB (ref 65–99)

## 2017-05-07 LAB — BASIC METABOLIC PANEL
Anion gap: 11 (ref 5–15)
BUN: 22 mg/dL — AB (ref 6–20)
CALCIUM: 9 mg/dL (ref 8.9–10.3)
CO2: 21 mmol/L — ABNORMAL LOW (ref 22–32)
CREATININE: 1.33 mg/dL — AB (ref 0.61–1.24)
Chloride: 103 mmol/L (ref 101–111)
GFR calc non Af Amer: 51 mL/min — ABNORMAL LOW (ref >60)
GFR, EST AFRICAN AMERICAN: 60 mL/min — AB (ref >60)
Glucose, Bld: 298 mg/dL — ABNORMAL HIGH (ref 65–99)
Potassium: 4.4 mmol/L (ref 3.5–5.1)
Sodium: 135 mmol/L (ref 135–145)

## 2017-05-07 LAB — CBC
HCT: 37.8 % — ABNORMAL LOW (ref 39.0–52.0)
Hemoglobin: 12.4 g/dL — ABNORMAL LOW (ref 13.0–17.0)
MCH: 31.3 pg (ref 26.0–34.0)
MCHC: 32.8 g/dL (ref 30.0–36.0)
MCV: 95.5 fL (ref 78.0–100.0)
PLATELETS: 115 10*3/uL — AB (ref 150–400)
RBC: 3.96 MIL/uL — ABNORMAL LOW (ref 4.22–5.81)
RDW: 14.4 % (ref 11.5–15.5)
WBC: 7.5 10*3/uL (ref 4.0–10.5)

## 2017-05-07 MED ORDER — SODIUM CHLORIDE 0.9 % IV BOLUS
1000.0000 mL | Freq: Once | INTRAVENOUS | Status: AC
  Administered 2017-05-07: 1000 mL via INTRAVENOUS

## 2017-05-07 MED ORDER — SULFAMETHOXAZOLE-TRIMETHOPRIM 800-160 MG PO TABS: 1.0000 | ORAL_TABLET | Freq: Two times a day (BID) | ORAL | 0 refills | Status: AC

## 2017-05-07 MED ORDER — CEFTRIAXONE SODIUM 1 G IJ SOLR
1.0000 g | Freq: Once | INTRAMUSCULAR | Status: AC
  Administered 2017-05-07: 1 g via INTRAVENOUS
  Filled 2017-05-07: qty 10

## 2017-05-07 NOTE — ED Triage Notes (Signed)
Patient c/o hyperglycemia since diabetic medication [facility]. Patient states diarrhea and blood sugar fluctuation. Patient states fasting blood sugar this morning 202. Per patient dizziness with room spinning, worse with standing. Denies any slurred speech, facial drooping, weakness, or headache.

## 2017-05-07 NOTE — ED Provider Notes (Signed)
[facility] EMERGENCY DEPARTMENT Provider Note   CSN: [id number] Arrival date & time: 05/07/17  1026     History   Chief Complaint Chief Complaint  Patient presents with  . Hyperglycemia    HPI [name] is a 72 y.o. male.  Patient complains of poorly controlled glucose with some dizziness.  He has recently started a different medicine  The history is provided by the patient. No language interpreter was used.  Illness  This is a chronic problem. The current episode started more than 2 days ago. The problem occurs constantly. The problem has not changed since onset.Pertinent negatives include no chest pain, no abdominal pain and no headaches. Nothing aggravates the symptoms. Nothing relieves the symptoms. He has tried nothing for the symptoms. The treatment provided no relief.    Past Medical History:  Diagnosis Date  . Arthritis   . BPH (benign prostatic hypertrophy) 04/15/2010  . Cataract   . Colon polyps   . DEPRESSION 01/22/2009  . Heart valve replaced   . HYPERCHOLESTEROLEMIA 01/22/2009  . HYPERTENSION 01/22/2009   Dr. [name]  . 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 Morbid obesity ([place])   . Tremors of nervous system    ?ptsd  . Ulcer     Patient Active Problem List   Diagnosis Date Noted  . S/P AVR (aortic valve replacement) 11/04/2016  . Atrial fibrillation ([place]) 09/27/2016  . Symptomatic bradycardia 06/12/2016  . Urgency incontinence 11/10/2015  . Groin pain   . Hypothyroidism 03/25/2015  . 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 Diastolic CHF ([place]) 10/93/2355  . B12 deficiency 04/08/2014  . Lateral meniscal tear   . DM type 2, not at goal [facility]) 03/12/2014  . 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 GERD (gastroesophageal reflux disease) 11/02/2012  . Anemia 04/02/2012  . Obesity 08/04/2010  . Benign prostatic hyperplasia 04/15/2010  . CAROTID OCCLUSIVE DISEASE 01/26/2010  . MURMUR 05/27/2009  . Aortic valve disease  05/27/2009  . Hyperlipidemia with target LDL less than 100 01/22/2009  . Depression 01/22/2009  . Essential hypertension 01/22/2009    Past Surgical History:  Procedure Laterality Date  . 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 APPENDECTOMY    . bilateral cataract surg    . COLONOSCOPY  04/24/2007   [name]: normal  . COLONOSCOPY WITH ESOPHAGOGASTRODUODENOSCOPY (EGD) N/A 04/05/2012   Procedure: COLONOSCOPY WITH ESOPHAGOGASTRODUODENOSCOPY (EGD);  Surgeon: Daneil Dolin, MD;  Location: [facility] ENDO [facility]ITE;  Service: Endoscopy;  Laterality: N/A;  10;15  . DENTAL SURGERY  05/2004   Dental extractions  . EYE SURGERY Bilateral    cataract  . HEEL SPUR SURGERY Bilateral    resection of heel spur  . KNEE ARTHROSCOPY WITH LATERAL MENISECTOMY Right 04/04/2014   Procedure: KNEE ARTHROSCOPY WITH LATERAL MENISECTOMY;  Surgeon: Carole Civil, MD;  Location: [facility] ORS;  Service: Orthopedics;  Laterality: Right;  . PACEMAKER IMPLANT N/A 06/13/2016   Procedure: Pacemaker Implant- Dual Chamber;  Surgeon: Evans Lance, MD;  Location: [facility];  Service: Cardiovascular;  Laterality: N/A;  . PACEMAKER INSERTION  2018  . THYROIDECTOMY  03/21/2013   DR [name]  . THYROIDECTOMY N/A 03/21/2013   Procedure: THYROIDECTOMY;  Surgeon: [name], MD;  Location: [facility];  Service: General;  Laterality: N/A;  . TONSILLECTOMY          Home Medications    Prior to Admission medications   Medication Sig Start Date End  Date Taking? Authorizing Provider  acetaminophen (TYLENOL) 500 MG tablet Take 1,000 mg by mouth every 6 (six) hours as needed (pain).   Yes [provider]  cholecalciferol (VITAMIN D) 1000 units tablet Take 1,000 Units by mouth 2 (two) times daily.   Yes [provider]  fluticasone (FLONASE) 50 MCG/ACT nasal spray Place 2 sprays into both nostrils daily. 05/01/17  Yes Timmothy Euler, MD  furosemide (LASIX) 20 MG tablet Take 1  tablet (20 mg total) by mouth daily. 04/17/17  Yes Timmothy Euler, MD  glucose blood test strip 1 strip by Does not apply route 3 (three) times daily. Uses true metrix strips (diabetic club) 05/12/16  Yes [provider]  insulin detemir (LEVEMIR) 100 UNIT/ML injection Inject 0.47-0.48 mLs (47-48 Units total) into the skin 2 (two) times daily. 07/04/16  Yes Eckard, Lynelle Smoke, PharmD  Insulin Pen Needle (B-D ULTRAFINE III SHORT PEN) 31G X 8 MM MISC Use to inject Victoza qd 11/20/15  Yes Timmothy Euler, MD  levofloxacin (LEVAQUIN) 750 MG tablet Take 1 tablet (750 mg total) by mouth daily. 05/01/17  Yes Timmothy Euler, MD  levothyroxine (SYNTHROID, LEVOTHROID) 200 MCG tablet Take 1 tablet (200 mcg total) by mouth daily before breakfast. 04/17/17  Yes Timmothy Euler, MD  metFORMIN (GLUCOPHAGE) 1000 MG tablet TAKE 1 TABLET BY MOUTH TWICE DAILY WITH A MEAL. 07/24/15  Yes Timmothy Euler, MD  Multiple Vitamins-Minerals (MENS MULTI VITAMIN & MINERAL PO) Take 1 tablet by mouth daily.     Yes [provider]  Omega-3 Fatty Acids (FISH OIL) 1200 MG CAPS Take 1,200-2,400 mg by mouth 2 (two) times daily. Take 1 capsule (1200 mg) by mouth daily at noon and 2 capsules (2400 mg) daily at bedtime   Yes [provider]  simvastatin (ZOCOR) 80 MG tablet Take 0.5 tablets (40 mg total) by mouth at bedtime. 04/04/17  Yes Timmothy Euler, MD  tamsulosin (FLOMAX) 0.4 MG CAPS capsule Take 2 capsules (0.8 mg total) by mouth at bedtime. 05/01/17  Yes Timmothy Euler, MD  sulfamethoxazole-trimethoprim (BACTRIM DS,SEPTRA DS) 800-160 MG tablet Take 1 tablet by mouth 2 (two) times daily for 7 days. 05/07/17 05/14/17  Milton Ferguson, MD  VICTOZA 18 MG/3ML SOPN INJECT 1.2 MG INTO THE SKIN DAILY. START WITH 0.6 MG DAILY FOR 1 WEEK THEN INCREASE TO 1.2 MG Patient not taking: Reported on 05/07/2017 10/06/16   Timmothy Euler, MD    Family History Family History  Problem Relation Age of Onset  .  Heart disease Father   . Congestive Heart Failure Father   . Arthritis Father   . Diabetes Unknown   . Benign prostatic hyperplasia Brother     Social History Social History   Tobacco Use  . Smoking status: Former Smoker    Packs/day: 0.00    Years: 12.00    Pack years: 0.00    Types: Cigarettes    Last attempt to quit: 09/20/1961    Years since quitting: 55.6  . Smokeless tobacco: Never Used  Substance Use Topics  . Alcohol use: No  . Drug use: No     Allergies   Phenothiazines and Actos [pioglitazone]   Review of Systems Review of Systems  Constitutional: Negative for appetite change and fatigue.  HENT: Negative for congestion, ear discharge and sinus pressure.   Eyes: Negative for discharge.  Respiratory: Negative for cough.   Cardiovascular: Negative for chest pain.  Gastrointestinal: Negative for abdominal pain and diarrhea.  Genitourinary: Negative for frequency and hematuria.  Musculoskeletal: Negative for back pain.  Skin: Negative for rash.  Neurological: Positive for weakness. Negative for seizures and headaches.  Psychiatric/Behavioral: Negative for hallucinations.     Physical Exam Updated Vital Signs BP 120/73   Pulse 73   Temp 98 F (36.7 C) (Oral)   Resp 17   Ht 6\' 1"  (1.854 m)   Wt 110.7 kg (244 lb)   SpO2 98%   BMI 32.19 kg/m   Physical Exam  Constitutional: He is oriented to person, place, and time. He appears well-developed.  HENT:  Head: Normocephalic.  Eyes: Conjunctivae and EOM are normal. No scleral icterus.  Neck: Neck supple. No thyromegaly present.  Cardiovascular: Normal rate and regular rhythm. Exam reveals no gallop and no friction rub.  No murmur heard. Pulmonary/Chest: No stridor. He has no wheezes. He has no rales. He exhibits no tenderness.  Abdominal: He exhibits no distension. There is no tenderness. There is no rebound.  Musculoskeletal: Normal range of motion. He exhibits no edema.  Lymphadenopathy:    He has no  cervical adenopathy.  Neurological: He is oriented to person, place, and time. He exhibits normal muscle tone. Coordination normal.  Skin: No rash noted. No erythema.  Psychiatric: He has a normal mood and affect. His behavior is normal.     ED Treatments / Results  Labs (all labs ordered are listed, but only abnormal results are displayed) Labs Reviewed  BASIC METABOLIC PANEL - Abnormal; Notable for the following components:      Result Value   CO2 21 (*)    Glucose, Bld 298 (*)    BUN 22 (*)    Creatinine, Ser 1.33 (*)    GFR calc non Af Amer 51 (*)    GFR calc Af Amer 60 (*)    All other components within normal limits  CBC - Abnormal; Notable for the following components:   RBC 3.96 (*)    Hemoglobin 12.4 (*)    HCT 37.8 (*)    Platelets 115 (*)    All other components within normal limits  URINALYSIS, ROUTINE W REFLEX MICROSCOPIC - Abnormal; Notable for the following components:   APPearance CLOUDY (*)    Glucose, UA >=500 (*)    Hgb urine dipstick MODERATE (*)    Leukocytes, UA LARGE (*)    Bacteria, UA RARE (*)    Squamous Epithelial / LPF 0-5 (*)    All other components within normal limits  CBG MONITORING, ED - Abnormal; Notable for the following components:   Glucose-Capillary 252 (*)    All other components within normal limits  CBG MONITORING, ED - Abnormal; Notable for the following components:   Glucose-Capillary 166 (*)    All other components within normal limits  URINE CULTURE    EKG None  Radiology No results found.  Procedures Procedures (including critical care time)  Medications Ordered in ED Medications  cefTRIAXone (ROCEPHIN) 1 g in sodium chloride 0.9 % 100 mL IVPB (0 g Intravenous Stopped 05/07/17 1513)  sodium chloride 0.9 % bolus 1,000 mL (0 mLs Intravenous Stopped 05/07/17 1400)     Initial Impression / Assessment and Plan / ED Course  I have reviewed the triage vital signs and the nursing notes.  Pertinent labs & imaging results  that were available during my care of the patient were reviewed by me and considered in my medical decision making (see chart for details).     Patient with poorly controlled  glucose and continued urinary tract infection.  His sugar has been controlled with fluids.  We will start him on Bactrim because his urine culture showed his infection was sensitive to Bactrim.  He will go ahead and finish his antibiotic he is on now and also start the Bactrim and follow-up with his doctor to discuss his glucose treatment  Final Clinical Impressions(s) / ED Diagnoses   Final diagnoses:  Hyperglycemia    ED Discharge Orders        Ordered    sulfamethoxazole-trimethoprim (BACTRIM DS,SEPTRA DS) 800-160 MG tablet  2 times daily     05/07/17 1451       Milton Ferguson, MD 05/07/17 1457

## 2017-05-07 NOTE — Discharge Instructions (Addendum)
Finish off the antibiotics your doctor gave you.  Also start the antibiotics I have written for.  Follow-up with your primary care doctor this week.

## 2017-05-07 NOTE — ED Notes (Signed)
Pt states that he is currently being treated for a urinary tract infection diagnosed on 05/01/17 by Dr. Wendi Snipes At [facility]

## 2017-05-09 LAB — URINE CULTURE: Culture: >=100000 — AB

## 2017-05-10 ENCOUNTER — Encounter: Payer: Self-pay | Admitting: Family Medicine

## 2017-05-10 ENCOUNTER — Ambulatory Visit (INDEPENDENT_AMBULATORY_CARE_PROVIDER_SITE_OTHER): Payer: Medicare Other | Admitting: Family Medicine

## 2017-05-10 ENCOUNTER — Telehealth: Payer: Self-pay | Admitting: Emergency Medicine

## 2017-05-10 ENCOUNTER — Ambulatory Visit (INDEPENDENT_AMBULATORY_CARE_PROVIDER_SITE_OTHER): Payer: Medicare Other

## 2017-05-10 VITALS — BP 108/58 | HR 90 | Temp 96.9°F | Ht 73.0 in | Wt 259.8 lb

## 2017-05-10 DIAGNOSIS — M545 Low back pain, unspecified: Secondary | ICD-10-CM

## 2017-05-10 DIAGNOSIS — E119 Type 2 diabetes mellitus without complications: Secondary | ICD-10-CM

## 2017-05-10 NOTE — Telephone Encounter (Signed)
Post ED Visit - Positive Culture Follow-up  Culture report reviewed by antimicrobial stewardship pharmacist:  []  Elenor Quinones, Pharm.D. []  Heide Guile, Pharm.D., BCPS AQ-ID []  Parks Neptune, Pharm.D., BCPS []  Alycia Rossetti, Pharm.D., BCPS []  [place], Pharm.D., BCPS, AAHIVP []  Legrand Como, Pharm.D., BCPS, AAHIVP []  Salome Arnt, PharmD, BCPS []  Jalene Mullet, PharmD []  Vincenza Hews, PharmD, BCPS Jimmy Footman PharmD  Positive urine culture Treated with bactrim, symptom check, if urinary symptoms resolved stop Bactrim, if urinary symptoms continue, complete Bactrim  Hazle Nordmann 05/10/2017, 3:11 PM

## 2017-05-10 NOTE — Progress Notes (Signed)
ED Antimicrobial Stewardship Positive Culture Follow Up   [name] is an 72 y.o. male who presented to [facility] on 05/07/2017 with a chief complaint of  Chief Complaint  Patient presents with  . Hyperglycemia    Recent Results (from the past 720 hour(s))  Microscopic Examination     Status: Abnormal   Collection Time: 05/01/17 11:11 AM  Result Value Ref Range Status   WBC, UA >30 (A) 0 - 5 /hpf Final   RBC, UA 11-30 (A) 0 - 2 /hpf Final   Epithelial Cells (non renal) 0-10 0 - 10 /hpf Final   Renal Epithel, UA None seen None seen /hpf Final   Bacteria, UA Moderate (A) None seen/Few Final  Urine Culture     Status: Abnormal   Collection Time: 05/01/17 11:37 AM  Result Value Ref Range Status   Urine Culture, Routine Final report (A)  Final   Organism ID, Bacteria Serratia marcescens (A)  Final    Comment: Greater than 100,000 colony forming units per mL   Antimicrobial Susceptibility Comment  Final    Comment:       ** S = Susceptible; I = Intermediate; R = Resistant **                    P = Positive; N = Negative             MICS are expressed in micrograms per mL    Antibiotic                 RSLT#1    RSLT#2    RSLT#3    RSLT#4 Amoxicillin/Clavulanic Acid    R Cefazolin                      R Cefepime                       S Ceftriaxone                    S Cefuroxime                     R Ciprofloxacin                  S Ertapenem                      S Gentamicin                     S Levofloxacin                   S Meropenem                      S Nitrofurantoin                 R Tetracycline                   R Tobramycin                     S Trimethoprim/Sulfa             S   Urine Culture     Status: Abnormal   Collection Time: 05/07/17 11:28 AM  Result Value Ref Range Status   Specimen Description   Final    URINE, CLEAN CATCH Performed at [facility], [address], [place], [place] [address]    Special Requests   Final    NONE Performed at  [facility], [address] [place], [place] [address]    Culture (A)  Final    >=100,000 COLONIES/mL CORYNEBACTERIUM SPECIES Standardized susceptibility testing for this organism is not available. Performed at [facility], [place] [address] [place], [place]  [address]    Report Status 05/09/2017 FINAL  Final    New antibiotic prescription: Call patient.  If urinary symptoms have resolved, stop Bactrim.  If urinary symptoms persist, finish course of Bactrim.  If still having no relief after Bactrim, return for evaluation.   ED Provider: Avie Echevaria, PA-C  Jimmy Footman, PharmD, BCPS PGY2 Infectious Diseases Pharmacy Resident Pager: [phone]   05/10/2017, 9:24 AM

## 2017-05-10 NOTE — Progress Notes (Signed)
   HPI  Patient presents today for hospital follow-up.  Patient was seen in the emergency room with weakness and fatigue found to be hyperglycemia.  Patient was being treated with Levaquin for sinusitis and UTI, he was started on Bactrim after his visit.  Patient states that he is recently had a change from [place] to [place].  He is tolerating it well, however about 1 week afterwards is when he went to the emergency room. He sees endocrinology at the [place]. He states that his blood sugars were very fluctuant between 100 and 350.  Patient states his fasting blood sugar is 118 this morning, he states that he is still having some weakness but it is improving.  Also his shortness of breath is improving.  Back pain Patient complains of 2 weeks or so of midline low back pain.  Has been using 2 Tylenol twice daily with pretty good improvement. He has a history of multiple joints with arthritis.   No leg symptoms reported  PMH: Smoking status noted ROS: Per HPI  Objective: BP (!) 108/58   Pulse 90   Temp (!) 96.9 F (36.1 C) (Oral)   Ht 6\' 1"  (1.854 m)   Wt 259 lb 12.8 oz (117.8 kg)   SpO2 98%   BMI 34.28 kg/m  Gen: NAD, alert, cooperative with exam HEENT: NCAT CV: RRR, good S1/S2, no murmur Resp: CTABL, no wheezes, non-labored Ext: No edema, warm Neuro: Alert and oriented, No gross deficits MSK:  Tenderness to palpation of midline lumbar spine, no paraspinal muscle tenderness  Assessment and plan:  # Low back pain Likely musculoskeletal, however considering age and midline pain I have proceeded with x-ray. Continue Tylenol as needed  #Type 2 diabetes Patient's fluctuant blood sugar was likely due to his transition between Victoza and ozempic, I would not recommend any changes at this point and I encouraged him to continue the new medication as the [facility] has instructed. I think he will do very well with it    Orders Placed This Encounter  Procedures  . DG Lumbar Spine 2-3  Views    Standing Status:   Future    Number of Occurrences:   1    Standing Expiration Date:   07/10/2018    Order Specific Question:   Reason for Exam (SYMPTOM  OR DIAGNOSIS REQUIRED)    Answer:   back pain    Order Specific Question:   Preferred imaging location?    Answer:   Internal     Laroy Apple, MD [facility] 05/10/2017, 11:39 AM

## 2017-05-18 ENCOUNTER — Telehealth: Payer: Self-pay | Admitting: *Deleted

## 2017-05-18 NOTE — Telephone Encounter (Signed)
Contacted by patient in response to letter sent to address on file.  States UTI symptoms have resolved and no further treatment is needed at this time.

## 2017-05-30 DIAGNOSIS — Z87891 Personal history of nicotine dependence: Secondary | ICD-10-CM | POA: Diagnosis not present

## 2017-05-30 DIAGNOSIS — I959 Hypotension, unspecified: Secondary | ICD-10-CM | POA: Diagnosis not present

## 2017-05-30 DIAGNOSIS — Z7902 Long term (current) use of antithrombotics/antiplatelets: Secondary | ICD-10-CM | POA: Diagnosis not present

## 2017-05-30 DIAGNOSIS — J449 Chronic obstructive pulmonary disease, unspecified: Secondary | ICD-10-CM | POA: Diagnosis not present

## 2017-05-30 DIAGNOSIS — N390 Urinary tract infection, site not specified: Secondary | ICD-10-CM | POA: Diagnosis not present

## 2017-05-30 DIAGNOSIS — I509 Heart failure, unspecified: Secondary | ICD-10-CM | POA: Diagnosis not present

## 2017-05-30 DIAGNOSIS — R42 Dizziness and giddiness: Secondary | ICD-10-CM | POA: Diagnosis not present

## 2017-05-30 DIAGNOSIS — J329 Chronic sinusitis, unspecified: Secondary | ICD-10-CM | POA: Diagnosis not present

## 2017-05-30 DIAGNOSIS — Z79899 Other long term (current) drug therapy: Secondary | ICD-10-CM | POA: Diagnosis not present

## 2017-05-30 DIAGNOSIS — I951 Orthostatic hypotension: Secondary | ICD-10-CM | POA: Diagnosis not present

## 2017-05-30 DIAGNOSIS — R05 Cough: Secondary | ICD-10-CM | POA: Diagnosis not present

## 2017-05-30 DIAGNOSIS — Z794 Long term (current) use of insulin: Secondary | ICD-10-CM | POA: Diagnosis not present

## 2017-05-30 DIAGNOSIS — E119 Type 2 diabetes mellitus without complications: Secondary | ICD-10-CM | POA: Diagnosis not present

## 2017-06-01 ENCOUNTER — Ambulatory Visit: Payer: Medicare Other | Admitting: *Deleted

## 2017-06-01 DIAGNOSIS — Z0130 Encounter for examination of blood pressure without abnormal findings: Secondary | ICD-10-CM

## 2017-06-01 NOTE — Progress Notes (Signed)
Pt here for BP check BP 98 58 P 96

## 2017-06-02 ENCOUNTER — Telehealth: Payer: Self-pay | Admitting: Family Medicine

## 2017-06-02 NOTE — Telephone Encounter (Signed)
BS 114 this AM 43 units twice daily  Wants to know if he can decrease units

## 2017-06-02 NOTE — Telephone Encounter (Signed)
Patient with blood sugar of 114 this a.m., would like to decrease his basal insulin.  He is currently on 43 units twice daily.  I am assuming that he is actually sensing hypoglycemia at 114. We will decrease very slightly to 41 units twice daily  Laroy Apple, MD [facility] 06/02/2017, 4:23 PM

## 2017-06-02 NOTE — Telephone Encounter (Signed)
lmtcb

## 2017-06-02 NOTE — Telephone Encounter (Signed)
Patient calling back states he has not recived call back

## 2017-06-05 ENCOUNTER — Telehealth: Payer: Self-pay | Admitting: Family Medicine

## 2017-06-05 NOTE — Telephone Encounter (Signed)
Called and discussed with patient, there is no sign of me writing this letter, I do not rcall writing it,.   He has documented response with Pharm D as being able to manage finances at [facility]  I typically only write letter by request and as documented.   He did not answer- I stated that I am sorry for any misunderstanding and I'd be glad to talk if he'd like.   Laroy Apple, MD [facility] 06/05/2017, 11:41 AM

## 2017-06-05 NOTE — Telephone Encounter (Signed)
Dup note  

## 2017-06-05 NOTE — Telephone Encounter (Signed)
Pt calling a second time to have nurse return his call. Please advise.

## 2017-06-05 NOTE — Telephone Encounter (Signed)
Patient aware and verbalizes understanding.   States he is "mad as hell at Dr. Wendi Snipes bc the [facility] told him that Dr. Wendi Snipes wrote them a letter stating he could not handel his own finances".  States he was going to have to get a Chief Executive Officer and go to court.   States "he was going to find a new doctor" and hung the phone up.

## 2017-06-05 NOTE — Telephone Encounter (Signed)
w

## 2017-06-05 NOTE — Telephone Encounter (Signed)
Aware letter has been sent.

## 2017-06-06 ENCOUNTER — Ambulatory Visit: Payer: Medicare Other | Admitting: Physician Assistant

## 2017-06-06 DIAGNOSIS — J449 Chronic obstructive pulmonary disease, unspecified: Secondary | ICD-10-CM | POA: Diagnosis not present

## 2017-06-06 DIAGNOSIS — I959 Hypotension, unspecified: Secondary | ICD-10-CM | POA: Diagnosis not present

## 2017-06-06 DIAGNOSIS — S8992XA Unspecified injury of left lower leg, initial encounter: Secondary | ICD-10-CM | POA: Diagnosis not present

## 2017-06-06 DIAGNOSIS — E119 Type 2 diabetes mellitus without complications: Secondary | ICD-10-CM | POA: Diagnosis not present

## 2017-06-06 DIAGNOSIS — R42 Dizziness and giddiness: Secondary | ICD-10-CM | POA: Diagnosis not present

## 2017-06-06 DIAGNOSIS — E860 Dehydration: Secondary | ICD-10-CM | POA: Diagnosis not present

## 2017-06-06 DIAGNOSIS — Z794 Long term (current) use of insulin: Secondary | ICD-10-CM | POA: Diagnosis not present

## 2017-06-06 DIAGNOSIS — E039 Hypothyroidism, unspecified: Secondary | ICD-10-CM | POA: Diagnosis not present

## 2017-06-06 DIAGNOSIS — Z79899 Other long term (current) drug therapy: Secondary | ICD-10-CM | POA: Diagnosis not present

## 2017-06-06 DIAGNOSIS — Z87891 Personal history of nicotine dependence: Secondary | ICD-10-CM | POA: Diagnosis not present

## 2017-06-06 DIAGNOSIS — R404 Transient alteration of awareness: Secondary | ICD-10-CM | POA: Diagnosis not present

## 2017-06-06 DIAGNOSIS — Z952 Presence of prosthetic heart valve: Secondary | ICD-10-CM | POA: Diagnosis not present

## 2017-06-06 DIAGNOSIS — M25562 Pain in left knee: Secondary | ICD-10-CM | POA: Diagnosis not present

## 2017-06-06 DIAGNOSIS — I951 Orthostatic hypotension: Secondary | ICD-10-CM | POA: Diagnosis not present

## 2017-06-06 DIAGNOSIS — I509 Heart failure, unspecified: Secondary | ICD-10-CM | POA: Diagnosis not present

## 2017-06-13 ENCOUNTER — Ambulatory Visit (INDEPENDENT_AMBULATORY_CARE_PROVIDER_SITE_OTHER): Payer: Medicare Other | Admitting: *Deleted

## 2017-06-13 ENCOUNTER — Telehealth: Payer: Self-pay | Admitting: Cardiology

## 2017-06-13 DIAGNOSIS — I442 Atrioventricular block, complete: Secondary | ICD-10-CM | POA: Diagnosis not present

## 2017-06-13 NOTE — Telephone Encounter (Signed)
Spoke with pt and reminded pt of remote transmission that is due today. Pt verbalized understanding.   

## 2017-06-15 ENCOUNTER — Encounter: Payer: Self-pay | Admitting: Cardiology

## 2017-06-15 NOTE — Progress Notes (Signed)
Remote pacemaker transmission.   

## 2017-06-21 ENCOUNTER — Encounter: Payer: Self-pay | Admitting: Family Medicine

## 2017-06-21 ENCOUNTER — Ambulatory Visit (INDEPENDENT_AMBULATORY_CARE_PROVIDER_SITE_OTHER): Payer: Medicare Other | Admitting: Family Medicine

## 2017-06-21 VITALS — BP 134/68 | HR 79 | Temp 97.0°F | Ht 73.0 in | Wt 251.6 lb

## 2017-06-21 DIAGNOSIS — N3000 Acute cystitis without hematuria: Secondary | ICD-10-CM

## 2017-06-21 DIAGNOSIS — E162 Hypoglycemia, unspecified: Secondary | ICD-10-CM | POA: Diagnosis not present

## 2017-06-21 LAB — URINALYSIS, COMPLETE
BILIRUBIN UA: NEGATIVE
KETONES UA: NEGATIVE
Nitrite, UA: NEGATIVE
Protein, UA: NEGATIVE
SPEC GRAV UA: 1.01 (ref 1.005–1.030)
UUROB: 0.2 mg/dL (ref 0.2–1.0)
pH, UA: 5.5 (ref 5.0–7.5)

## 2017-06-21 LAB — MICROSCOPIC EXAMINATION

## 2017-06-21 MED ORDER — SULFAMETHOXAZOLE-TRIMETHOPRIM 800-160 MG PO TABS: 1.0000 | ORAL_TABLET | Freq: Two times a day (BID) | ORAL | 0 refills | Status: DC

## 2017-06-21 NOTE — Progress Notes (Signed)
BP 134/68   Pulse 79   Temp (!) 97 F (36.1 C) (Oral)   Ht 6\' 1"  (1.854 m)   Wt 251 lb 9.6 oz (114.1 kg)   BMI 33.19 kg/m    Subjective:    Patient ID: [name], male    DOB: 03/17/1945, 72 y.o.   MRN: [id number]  HPI: [name] is a 72 y.o. male presenting on 06/21/2017 for Fatigue (BS 107 has been dropping) and Urinary Tract Infection (has had 2 infections in the last month)   HPI Dysuria and frequency Patient has been having dysuria and frequency and has been treated for 2 different urinary tract infections recently and wants to come in to get checked.  He still feels like he has a little bit of frequency and dysuria but not like he had before he was treated.  He denies any flank pain or fevers or chills.  He feels like things mostly got better after the last antibiotic but not completely and that is why he wants to be rechecked.  He also complains of low blood sugars of 107 and 87.  He says that he has been feeling shaky and jittery like his blood sugars are low with this.  He denies any blood sugars lower than 75  Relevant past medical, surgical, family and social history reviewed and updated as indicated. Interim medical history since our last visit reviewed. Allergies and medications reviewed and updated.  Review of Systems  Constitutional: Negative for chills and fever.  Respiratory: Negative for shortness of breath and wheezing.   Cardiovascular: Negative for chest pain and leg swelling.  Genitourinary: Positive for dysuria, frequency and urgency. Negative for difficulty urinating, flank pain and hematuria.  Musculoskeletal: Negative for back pain and gait problem.  Skin: Negative for rash.  Neurological: Positive for tremors. Negative for dizziness, light-headedness, numbness and headaches.  All other systems reviewed and are negative.   Per HPI unless specifically indicated above   Allergies as of 06/21/2017      Reactions   Phenothiazines Anaphylaxis     Actos [pioglitazone] Swelling      Medication List        Accurate as of 06/21/17 11:58 AM. Always use your most recent med list.          acetaminophen 500 MG tablet Commonly known as:  TYLENOL Take 1,000 mg by mouth every 6 (six) hours as needed (pain).   cholecalciferol 1000 units tablet Commonly known as:  VITAMIN D Take 1,000 Units by mouth 2 (two) times daily.   Fish Oil 1200 MG Caps Take 1,200-2,400 mg by mouth 2 (two) times daily. Take 1 capsule (1200 mg) by mouth daily at noon and 2 capsules (2400 mg) daily at bedtime   fluticasone 50 MCG/ACT nasal spray Commonly known as:  FLONASE Place 2 sprays into both nostrils daily.   furosemide 20 MG tablet Commonly known as:  LASIX Take 1 tablet (20 mg total) by mouth daily.   glucose blood test strip 1 strip by Does not apply route 3 (three) times daily. Uses true metrix strips (diabetic club)   insulin detemir 100 UNIT/ML injection Commonly known as:  LEVEMIR Inject 0.47-0.48 mLs (47-48 Units total) into the skin 2 (two) times daily.   Insulin Pen Needle 31G X 8 MM Misc Commonly known as:  B-D ULTRAFINE III SHORT PEN Use to inject Victoza qd   levothyroxine 200 MCG tablet Commonly known as:  SYNTHROID, LEVOTHROID Take 1 tablet (200  mcg total) by mouth daily before breakfast.   MENS MULTI VITAMIN & MINERAL PO Take 1 tablet by mouth daily.   metFORMIN 1000 MG tablet Commonly known as:  GLUCOPHAGE TAKE 1 TABLET BY MOUTH TWICE DAILY WITH A MEAL.   OZEMPIC 0.25 or 0.5 MG/DOSE Sopn Generic drug:  Semaglutide Inject into the skin.   simvastatin 80 MG tablet Commonly known as:  ZOCOR Take 0.5 tablets (40 mg total) by mouth at bedtime.   sulfamethoxazole-trimethoprim 800-160 MG tablet Commonly known as:  BACTRIM DS,SEPTRA DS Take 1 tablet by mouth 2 (two) times daily.   tamsulosin 0.4 MG Caps capsule Commonly known as:  FLOMAX Take 2 capsules (0.8 mg total) by mouth at bedtime.          Objective:     BP 134/68   Pulse 79   Temp (!) 97 F (36.1 C) (Oral)   Ht 6\' 1"  (1.854 m)   Wt 251 lb 9.6 oz (114.1 kg)   BMI 33.19 kg/m   Wt Readings from Last 3 Encounters:  06/21/17 251 lb 9.6 oz (114.1 kg)  05/10/17 259 lb 12.8 oz (117.8 kg)  05/07/17 244 lb (110.7 kg)    Physical Exam  Constitutional: He is oriented to person, place, and time. He appears well-developed and well-nourished. No distress.  Eyes: Conjunctivae are normal. No scleral icterus.  Cardiovascular: Normal rate, regular rhythm, normal heart sounds and intact distal pulses.  No murmur heard. Pulmonary/Chest: Effort normal and breath sounds normal. No respiratory distress. He has no wheezes.  Abdominal: Soft. Bowel sounds are normal. He exhibits no distension. There is no tenderness. There is no rebound and no guarding.  Musculoskeletal: Normal range of motion. He exhibits no edema.  Neurological: He is alert and oriented to person, place, and time. Coordination normal.  Skin: Skin is warm and dry. No rash noted. He is not diaphoretic.  Psychiatric: He has a normal mood and affect. His behavior is normal.  Nursing note and vitals reviewed.   Urinalysis: Greater than 30 WBCs, 0-2 RBCs 0-10 epithelial cells 0-10 renal epithelial cells, few bacteria, trace glucose, trace blood, 1+ leukocytes    Assessment & Plan:   Problem List Items Addressed This Visit    None    Visit Diagnoses    Acute cystitis without hematuria    -  Primary   Relevant Medications   sulfamethoxazole-trimethoprim (BACTRIM DS,SEPTRA DS) 800-160 MG tablet   Other Relevant Orders   Urinalysis, Complete   Urine Culture   Hypoglycemia          Reduce Levemir to 20 units daily at bedtime,  Test blood sugar every morning, if greater than 200 blood sugar then increase Levemir by 2 units for the next 3 days and if still greater than 200 then increase by 2 more units for 3 more days and keep going up until less than 200  Follow up plan: Return if  symptoms worsen or fail to improve.  Counseling provided for all of the vaccine components Orders Placed This Encounter  Procedures  . Urinalysis, Complete    Caryl Pina, MD [facility] 06/21/2017, 11:58 AM

## 2017-06-21 NOTE — Patient Instructions (Signed)
Reduce Levemir to 20 units daily at bedtime,  Test blood sugar every morning, if greater than 200 blood sugar then increase Levemir by 2 units for the next 3 days and if still greater than 200 then increase by 2 more units for 3 more days and keep going up until less than 200

## 2017-06-23 ENCOUNTER — Encounter: Payer: Medicare Other | Admitting: Internal Medicine

## 2017-06-23 LAB — CUP PACEART REMOTE DEVICE CHECK
Brady Statistic AP VP Percent: 1.16 %
Brady Statistic AP VS Percent: 0.02 %
Brady Statistic AS VP Percent: 98.67 %
Brady Statistic AS VS Percent: 0.15 %
Brady Statistic RA Percent Paced: 1.17 %
Brady Statistic RV Percent Paced: 99.8 %
Implantable Lead Implant Date: 20180521
Implantable Lead Location: 753859
Implantable Lead Model: 5076
Lead Channel Impedance Value: 304 Ohm
Lead Channel Impedance Value: 399 Ohm
Lead Channel Pacing Threshold Amplitude: 0.625 V
Lead Channel Pacing Threshold Pulse Width: 0.4 ms
Lead Channel Pacing Threshold Pulse Width: 0.4 ms
Lead Channel Sensing Intrinsic Amplitude: 1.75 mV
Lead Channel Sensing Intrinsic Amplitude: 18.125 mV
Lead Channel Setting Pacing Pulse Width: 0.4 ms
MDC IDC LEAD IMPLANT DT: 20180521
MDC IDC LEAD LOCATION: 753859
MDC IDC MSMT BATTERY REMAINING LONGEVITY: 121 mo
MDC IDC MSMT BATTERY VOLTAGE: 3.04 V
MDC IDC MSMT LEADCHNL RA PACING THRESHOLD AMPLITUDE: 0.75 V
MDC IDC MSMT LEADCHNL RA SENSING INTR AMPL: 1.75 mV
MDC IDC MSMT LEADCHNL RV IMPEDANCE VALUE: 323 Ohm
MDC IDC MSMT LEADCHNL RV IMPEDANCE VALUE: 418 Ohm
MDC IDC MSMT LEADCHNL RV SENSING INTR AMPL: 18.125 mV
MDC IDC PG IMPLANT DT: 20180521
MDC IDC SESS DTM: 20190523053824
MDC IDC SET LEADCHNL RA PACING AMPLITUDE: 1.5 V
MDC IDC SET LEADCHNL RV PACING AMPLITUDE: 2.5 V
MDC IDC SET LEADCHNL RV SENSING SENSITIVITY: 2 mV

## 2017-06-23 LAB — URINE CULTURE

## 2017-06-29 ENCOUNTER — Encounter: Payer: Medicare Other | Admitting: Internal Medicine

## 2017-07-06 ENCOUNTER — Encounter: Payer: Self-pay | Admitting: Endocrinology

## 2017-07-11 ENCOUNTER — Ambulatory Visit (INDEPENDENT_AMBULATORY_CARE_PROVIDER_SITE_OTHER): Payer: Medicare Other

## 2017-07-11 ENCOUNTER — Encounter: Payer: Self-pay | Admitting: Family Medicine

## 2017-07-11 ENCOUNTER — Ambulatory Visit (INDEPENDENT_AMBULATORY_CARE_PROVIDER_SITE_OTHER): Payer: Medicare Other | Admitting: Family Medicine

## 2017-07-11 ENCOUNTER — Ambulatory Visit: Payer: Medicare Other | Admitting: Family Medicine

## 2017-07-11 VITALS — BP 142/71 | HR 73 | Resp 20

## 2017-07-11 DIAGNOSIS — R202 Paresthesia of skin: Secondary | ICD-10-CM

## 2017-07-11 DIAGNOSIS — R42 Dizziness and giddiness: Secondary | ICD-10-CM | POA: Diagnosis not present

## 2017-07-11 DIAGNOSIS — M545 Low back pain: Secondary | ICD-10-CM | POA: Diagnosis not present

## 2017-07-11 DIAGNOSIS — N390 Urinary tract infection, site not specified: Secondary | ICD-10-CM

## 2017-07-11 LAB — URINALYSIS, COMPLETE
BILIRUBIN UA: NEGATIVE
GLUCOSE, UA: NEGATIVE
KETONES UA: NEGATIVE
NITRITE UA: NEGATIVE
PROTEIN UA: NEGATIVE
SPEC GRAV UA: 1.01 (ref 1.005–1.030)
UUROB: 0.2 mg/dL (ref 0.2–1.0)
pH, UA: 5.5 (ref 5.0–7.5)

## 2017-07-11 LAB — MICROSCOPIC EXAMINATION: Renal Epithel, UA: NONE SEEN /hpf

## 2017-07-11 NOTE — Progress Notes (Signed)
   HPI  Patient presents today here with vague complaints  Patient complains of 24 hours of symptoms including dizziness described as unsteadiness that lasts for about 30 seconds when he first stands up.  He states that he has had diarrhea, however after more explanation he has had 2 soft stools today.  Patient states earlier to triage nursing that he had first unilateral leg tingling then the other leg tingling and then he reports bilateral leg tingling to me.  After waiting in the room for about 5 minutes he reports bilateral feet tingling.  No injury, he states that he does have some mild low back pain in the lumbar area with no radiation down his legs has been going on for about the same time.  He states that it gets a little bit worse when walking but does not bother him when flexing forward.  PMH: Smoking status noted ROS: Per HPI  Objective: BP (!) 142/71   Pulse 73   Resp 20   SpO2 99%  Gen: NAD, alert, cooperative with exam HEENT: NCAT, EOMI, PERRL CV: RRR, good S1/S2, no murmur Resp: CTABL, no wheezes, non-labored Ext: No edema, warm Neuro: Alert and oriented, strength 5/5 and sensation intact on BL LE. Stands without dizziness on exam   Assessment and plan:  #Dizziness Most consistent with orthostasis per his description, however has no problems standing in the clinic with no dizziness today. Patient has history of UTIs, checking urine today CBC as well as CMP. Encouraged hydration  #Tingling in the extremities Patient with changing story while he is here in the clinic, his symptoms are odd and inconsistent. Given mild low back pain of gone ahead and done an x-ray today to rule out any bony abnormality. Discussed reasons to return including weakness persistent numbness, or change in sensation. His exam is very reassuring.    Orders Placed This Encounter  Procedures  . DG Lumbar Spine 2-3 Views    Standing Status:   Future    Standing Expiration Date:    09/10/2018    Order Specific Question:   Reason for Exam (SYMPTOM  OR DIAGNOSIS REQUIRED)    Answer:   low back pain    Order Specific Question:   Preferred imaging location?    Answer:   Internal  . CBC with Differential/Platelet  . CMP14+EGFR  . Urinalysis, Complete    Laroy Apple, MD [facility] 07/11/2017, 1:32 PM

## 2017-07-11 NOTE — Patient Instructions (Signed)
Great to see you!   

## 2017-07-12 LAB — CBC WITH DIFFERENTIAL/PLATELET
BASOS ABS: 0 10*3/uL (ref 0.0–0.2)
Basos: 0 %
EOS (ABSOLUTE): 0.1 10*3/uL (ref 0.0–0.4)
Eos: 2 %
Hematocrit: 40.5 % (ref 37.5–51.0)
Hemoglobin: 13.1 g/dL (ref 13.0–17.7)
Immature Grans (Abs): 0 10*3/uL (ref 0.0–0.1)
Immature Granulocytes: 0 %
LYMPHS ABS: 2.5 10*3/uL (ref 0.7–3.1)
Lymphs: 33 %
MCH: 31.8 pg (ref 26.6–33.0)
MCHC: 32.3 g/dL (ref 31.5–35.7)
MCV: 98 fL — AB (ref 79–97)
MONOS ABS: 0.7 10*3/uL (ref 0.1–0.9)
Monocytes: 9 %
NEUTROS ABS: 4.3 10*3/uL (ref 1.4–7.0)
Neutrophils: 56 %
PLATELETS: 94 10*3/uL — AB (ref 150–450)
RBC: 4.12 x10E6/uL — AB (ref 4.14–5.80)
RDW: 15.6 % — ABNORMAL HIGH (ref 12.3–15.4)
WBC: 7.6 10*3/uL (ref 3.4–10.8)

## 2017-07-12 LAB — CMP14+EGFR
A/G RATIO: 1.7 (ref 1.2–2.2)
ALBUMIN: 4.3 g/dL (ref 3.5–4.8)
ALK PHOS: 59 IU/L (ref 39–117)
ALT: 24 IU/L (ref 0–44)
AST: 23 IU/L (ref 0–40)
BILIRUBIN TOTAL: 0.4 mg/dL (ref 0.0–1.2)
BUN / CREAT RATIO: 25 — AB (ref 10–24)
BUN: 29 mg/dL — AB (ref 8–27)
CHLORIDE: 101 mmol/L (ref 96–106)
CO2: 21 mmol/L (ref 20–29)
Calcium: 9.5 mg/dL (ref 8.6–10.2)
Creatinine, Ser: 1.18 mg/dL (ref 0.76–1.27)
GFR calc non Af Amer: 61 mL/min/{1.73_m2} (ref >59)
GFR, EST AFRICAN AMERICAN: 70 mL/min/{1.73_m2} (ref >59)
GLUCOSE: 171 mg/dL — AB (ref 65–99)
Globulin, Total: 2.5 g/dL (ref 1.5–4.5)
POTASSIUM: 4.5 mmol/L (ref 3.5–5.2)
Sodium: 137 mmol/L (ref 134–144)
TOTAL PROTEIN: 6.8 g/dL (ref 6.0–8.5)

## 2017-07-13 ENCOUNTER — Telehealth: Payer: Self-pay | Admitting: Family Medicine

## 2017-07-13 ENCOUNTER — Other Ambulatory Visit: Payer: Self-pay | Admitting: Family Medicine

## 2017-07-13 MED ORDER — CEPHALEXIN 500 MG PO CAPS: 500.0000 mg | ORAL_CAPSULE | Freq: Three times a day (TID) | ORAL | 0 refills | Status: DC

## 2017-07-13 NOTE — Addendum Note (Signed)
Addended by: Nigel Berthold C on: 07/13/2017 11:37 AM   Modules accepted: Orders

## 2017-07-13 NOTE — Telephone Encounter (Signed)
Voice mail came on, [facility]

## 2017-07-13 NOTE — Telephone Encounter (Signed)
Patient wanted to know his glucose - aware.

## 2017-07-13 NOTE — Telephone Encounter (Signed)
Patient wants the nurse to call him ASAP. Second call

## 2017-07-18 ENCOUNTER — Encounter: Payer: Self-pay | Admitting: *Deleted

## 2017-07-25 NOTE — Telephone Encounter (Signed)
Phone call taken care of in different encounter.  This encounter will now be closed  

## 2017-07-31 ENCOUNTER — Ambulatory Visit: Payer: Medicare Other | Admitting: Family Medicine

## 2017-08-01 ENCOUNTER — Ambulatory Visit: Payer: Medicare Other | Admitting: Family Medicine

## 2017-08-02 ENCOUNTER — Encounter: Payer: Self-pay | Admitting: Family Medicine

## 2017-08-02 ENCOUNTER — Ambulatory Visit (INDEPENDENT_AMBULATORY_CARE_PROVIDER_SITE_OTHER): Payer: Medicare Other | Admitting: Family Medicine

## 2017-08-02 VITALS — BP 146/69 | HR 66 | Temp 97.2°F | Ht 73.0 in | Wt 254.4 lb

## 2017-08-02 DIAGNOSIS — N390 Urinary tract infection, site not specified: Secondary | ICD-10-CM | POA: Diagnosis not present

## 2017-08-02 DIAGNOSIS — E119 Type 2 diabetes mellitus without complications: Secondary | ICD-10-CM | POA: Diagnosis not present

## 2017-08-02 DIAGNOSIS — D696 Thrombocytopenia, unspecified: Secondary | ICD-10-CM

## 2017-08-02 DIAGNOSIS — R42 Dizziness and giddiness: Secondary | ICD-10-CM

## 2017-08-02 LAB — URINALYSIS, COMPLETE
Bilirubin, UA: NEGATIVE
Glucose, UA: NEGATIVE
Ketones, UA: NEGATIVE
NITRITE UA: NEGATIVE
Protein, UA: NEGATIVE
SPEC GRAV UA: 1.015 (ref 1.005–1.030)
Urobilinogen, Ur: 0.2 mg/dL (ref 0.2–1.0)
pH, UA: 7 (ref 5.0–7.5)

## 2017-08-02 LAB — MICROSCOPIC EXAMINATION: WBC, UA: >30 /hpf — AB (ref 0–5)

## 2017-08-02 LAB — BAYER DCA HB A1C WAIVED: HB A1C: 6.8 % (ref <7.0)

## 2017-08-02 NOTE — Progress Notes (Signed)
   HPI  Patient presents today for follow-up.  Type 2 diabetes Patient is tolerating Ozempic very well.  He states that his fasting blood sugars are around 120 most the time. Also taking metformin without problem. No hypoglycemia. Patient is watching diet, he is active around the house.  Patient's had recurrent UTI, no signs of UTI today. He has been referred to urology by the [facility].  Patient also has history of dizziness on her last visit, after treatment for UTI this has resolved.  PMH: Smoking status noted ROS: Per HPI  Objective: BP (!) 146/69   Pulse 66   Temp (!) 97.2 F (36.2 C) (Oral)   Ht _0  (1.854 m)   Wt 254 lb 6.4 oz (115.4 kg)   BMI 33.56 kg/m  Gen: NAD, alert, cooperative with exam HEENT: NCAT CV: RRR, good S1/S2, no murmur Resp: CTABL, no wheezes, non-labored Abd: SNTND, BS present, no guarding or organomegaly Ext: No edema, warm Neuro: Alert and oriented, No gross deficits  Assessment and plan:  #Diabetes A1c previously slightly uncontrolled, patient on Ozempic plus metformin plus Levemir Tolerating well  #Recurrent UTI Patient with some signs of UTI on micro today, urine culture pending. Without symptoms will not treat until culture is finished.   #Dizziness Resolved after treatment for UTI   Orders Placed This Encounter  Procedures  . Urine Culture  . Urinalysis, Complete  . Bayer DCA Hb A1c Waived  . BMP8+EGFR  . CBC with [place], MD [facility] 08/02/2017, 8:25 AM

## 2017-08-03 ENCOUNTER — Telehealth: Payer: Self-pay | Admitting: Family Medicine

## 2017-08-03 LAB — CBC WITH DIFFERENTIAL/PLATELET
BASOS ABS: 0 10*3/uL (ref 0.0–0.2)
Basos: 0 %
EOS (ABSOLUTE): 0.2 10*3/uL (ref 0.0–0.4)
Eos: 3 %
Hematocrit: 37.4 % — ABNORMAL LOW (ref 37.5–51.0)
Hemoglobin: 12.6 g/dL — ABNORMAL LOW (ref 13.0–17.7)
Immature Grans (Abs): 0 10*3/uL (ref 0.0–0.1)
Immature Granulocytes: 0 %
LYMPHS: 28 %
Lymphocytes Absolute: 1.9 10*3/uL (ref 0.7–3.1)
MCH: 32.5 pg (ref 26.6–33.0)
MCHC: 33.7 g/dL (ref 31.5–35.7)
MCV: 96 fL (ref 79–97)
Monocytes Absolute: 0.5 10*3/uL (ref 0.1–0.9)
Monocytes: 7 %
NEUTROS ABS: 4.3 10*3/uL (ref 1.4–7.0)
NEUTROS PCT: 62 %
PLATELETS: 77 10*3/uL — AB (ref 150–450)
RBC: 3.88 x10E6/uL — ABNORMAL LOW (ref 4.14–5.80)
RDW: 16.1 % — ABNORMAL HIGH (ref 12.3–15.4)
WBC: 7 10*3/uL (ref 3.4–10.8)

## 2017-08-03 LAB — BMP8+EGFR
BUN/Creatinine Ratio: 14 (ref 10–24)
BUN: 16 mg/dL (ref 8–27)
CHLORIDE: 102 mmol/L (ref 96–106)
CO2: 22 mmol/L (ref 20–29)
Calcium: 9 mg/dL (ref 8.6–10.2)
Creatinine, Ser: 1.16 mg/dL (ref 0.76–1.27)
GFR calc Af Amer: 72 mL/min/{1.73_m2} (ref >59)
GFR calc non Af Amer: 62 mL/min/{1.73_m2} (ref >59)
GLUCOSE: 132 mg/dL — AB (ref 65–99)
POTASSIUM: 4.3 mmol/L (ref 3.5–5.2)
Sodium: 140 mmol/L (ref 134–144)

## 2017-08-03 NOTE — Addendum Note (Signed)
Addended by: Nigel Berthold C on: 08/03/2017 02:45 PM   Modules accepted: Orders

## 2017-08-03 NOTE — Telephone Encounter (Signed)
lmtcb

## 2017-08-03 NOTE — Telephone Encounter (Signed)
Attempted to contact patient - line busy x2 °

## 2017-08-03 NOTE — Telephone Encounter (Signed)
Patient aware.

## 2017-08-04 ENCOUNTER — Telehealth: Payer: Self-pay | Admitting: Family Medicine

## 2017-08-04 MED ORDER — CIPROFLOXACIN HCL 500 MG PO TABS: 500.0000 mg | ORAL_TABLET | Freq: Two times a day (BID) | ORAL | 0 refills | Status: DC

## 2017-08-04 NOTE — Telephone Encounter (Signed)
lmtcb

## 2017-08-04 NOTE — Telephone Encounter (Signed)
Please review and advise.

## 2017-08-04 NOTE — Telephone Encounter (Signed)
Pt with gram-negative rods on urine culture, treat with Cipro.  Ideally we would wait for susceptibilities, however these may arrive over the weekend.  Laroy Apple, MD [facility] 08/04/2017, 9:45 AM

## 2017-08-05 LAB — URINE CULTURE

## 2017-08-09 NOTE — Telephone Encounter (Signed)
Refer to phone call from 7/12.

## 2017-08-09 NOTE — Telephone Encounter (Signed)
Aware of appointment time and date for referral.

## 2017-08-11 ENCOUNTER — Telehealth: Payer: Self-pay | Admitting: Family Medicine

## 2017-08-11 NOTE — Telephone Encounter (Signed)
Pt states he completed the cipro this morning but he is still having frequent urination but denies dysuria. Pt is requesting a stronger antibiotic be sent in. I advised pt that the culture came back and showed the Cipro should have taken care of the UTI but pt wanted me to send you a message and ask.

## 2017-08-11 NOTE — Telephone Encounter (Signed)
Pt needs to leave repeat culture for additional antibiotics, if not improving then needs to be seen again.   Laroy Apple, MD [facility] 08/11/2017, 6:46 PM

## 2017-08-14 NOTE — Telephone Encounter (Signed)
Line busy x 2

## 2017-08-16 ENCOUNTER — Inpatient Hospital Stay (HOSPITAL_COMMUNITY): Payer: Medicare Other | Attending: Internal Medicine | Admitting: Internal Medicine

## 2017-08-16 ENCOUNTER — Encounter (HOSPITAL_COMMUNITY): Payer: Self-pay | Admitting: Internal Medicine

## 2017-08-16 ENCOUNTER — Inpatient Hospital Stay (HOSPITAL_COMMUNITY): Payer: Medicare Other

## 2017-08-16 ENCOUNTER — Other Ambulatory Visit: Payer: Self-pay

## 2017-08-16 VITALS — BP 142/57 | HR 67 | Temp 98.0°F | Resp 18 | Ht 73.0 in | Wt 257.7 lb

## 2017-08-16 DIAGNOSIS — Z87891 Personal history of nicotine dependence: Secondary | ICD-10-CM | POA: Diagnosis not present

## 2017-08-16 DIAGNOSIS — Z720 Tobacco use: Secondary | ICD-10-CM | POA: Diagnosis not present

## 2017-08-16 DIAGNOSIS — M25559 Pain in unspecified hip: Secondary | ICD-10-CM

## 2017-08-16 DIAGNOSIS — I4891 Unspecified atrial fibrillation: Secondary | ICD-10-CM | POA: Diagnosis not present

## 2017-08-16 DIAGNOSIS — Z79899 Other long term (current) drug therapy: Secondary | ICD-10-CM

## 2017-08-16 DIAGNOSIS — M129 Arthropathy, unspecified: Secondary | ICD-10-CM | POA: Diagnosis not present

## 2017-08-16 DIAGNOSIS — E119 Type 2 diabetes mellitus without complications: Secondary | ICD-10-CM | POA: Insufficient documentation

## 2017-08-16 DIAGNOSIS — I10 Essential (primary) hypertension: Secondary | ICD-10-CM | POA: Diagnosis not present

## 2017-08-16 DIAGNOSIS — R0602 Shortness of breath: Secondary | ICD-10-CM | POA: Diagnosis not present

## 2017-08-16 DIAGNOSIS — E669 Obesity, unspecified: Secondary | ICD-10-CM

## 2017-08-16 DIAGNOSIS — E039 Hypothyroidism, unspecified: Secondary | ICD-10-CM | POA: Diagnosis not present

## 2017-08-16 DIAGNOSIS — K219 Gastro-esophageal reflux disease without esophagitis: Secondary | ICD-10-CM | POA: Diagnosis not present

## 2017-08-16 DIAGNOSIS — Z86010 Personal history of colonic polyps: Secondary | ICD-10-CM | POA: Diagnosis not present

## 2017-08-16 DIAGNOSIS — R140 Abdominal distension (gaseous): Secondary | ICD-10-CM

## 2017-08-16 DIAGNOSIS — N3000 Acute cystitis without hematuria: Secondary | ICD-10-CM | POA: Diagnosis not present

## 2017-08-16 DIAGNOSIS — F329 Major depressive disorder, single episode, unspecified: Secondary | ICD-10-CM

## 2017-08-16 DIAGNOSIS — N390 Urinary tract infection, site not specified: Secondary | ICD-10-CM | POA: Diagnosis not present

## 2017-08-16 DIAGNOSIS — N400 Enlarged prostate without lower urinary tract symptoms: Secondary | ICD-10-CM | POA: Insufficient documentation

## 2017-08-16 DIAGNOSIS — D696 Thrombocytopenia, unspecified: Secondary | ICD-10-CM

## 2017-08-16 DIAGNOSIS — M2550 Pain in unspecified joint: Secondary | ICD-10-CM | POA: Diagnosis not present

## 2017-08-16 DIAGNOSIS — E7800 Pure hypercholesterolemia, unspecified: Secondary | ICD-10-CM | POA: Insufficient documentation

## 2017-08-16 LAB — FERRITIN: Ferritin: 322 ng/mL (ref 24–336)

## 2017-08-16 LAB — CBC WITH DIFFERENTIAL/PLATELET
BASOS ABS: 0 10*3/uL (ref 0.0–0.1)
BASOS PCT: 0 %
EOS ABS: 0.2 10*3/uL (ref 0.0–0.7)
Eosinophils Relative: 3 %
HCT: 37.2 % — ABNORMAL LOW (ref 39.0–52.0)
HEMOGLOBIN: 12.3 g/dL — AB (ref 13.0–17.0)
LYMPHS ABS: 2.1 10*3/uL (ref 0.7–4.0)
Lymphocytes Relative: 26 %
MCH: 32 pg (ref 26.0–34.0)
MCHC: 33.1 g/dL (ref 30.0–36.0)
MCV: 96.9 fL (ref 78.0–100.0)
Monocytes Absolute: 0.5 10*3/uL (ref 0.1–1.0)
Monocytes Relative: 7 %
NEUTROS PCT: 64 %
Neutro Abs: 5.1 10*3/uL (ref 1.7–7.7)
PLATELETS: 78 10*3/uL — AB (ref 150–400)
RBC: 3.84 MIL/uL — AB (ref 4.22–5.81)
RDW: 15.4 % (ref 11.5–15.5)
WBC: 7.9 10*3/uL (ref 4.0–10.5)

## 2017-08-16 LAB — COMPREHENSIVE METABOLIC PANEL
ALK PHOS: 50 U/L (ref 38–126)
ALT: 15 U/L (ref 0–44)
AST: 18 U/L (ref 15–41)
Albumin: 3.7 g/dL (ref 3.5–5.0)
Anion gap: 8 (ref 5–15)
BUN: 26 mg/dL — AB (ref 8–23)
CHLORIDE: 103 mmol/L (ref 98–111)
CO2: 25 mmol/L (ref 22–32)
CREATININE: 1.17 mg/dL (ref 0.61–1.24)
Calcium: 8.9 mg/dL (ref 8.9–10.3)
GFR calc Af Amer: >60 mL/min (ref >60)
Glucose, Bld: 233 mg/dL — ABNORMAL HIGH (ref 70–99)
Potassium: 4.2 mmol/L (ref 3.5–5.1)
Sodium: 136 mmol/L (ref 135–145)
Total Bilirubin: 0.7 mg/dL (ref 0.3–1.2)
Total Protein: 6.9 g/dL (ref 6.5–8.1)

## 2017-08-16 LAB — PROTIME-INR
INR: 1.42
Prothrombin Time: 17.2 seconds — ABNORMAL HIGH (ref 11.4–15.2)

## 2017-08-16 LAB — SEDIMENTATION RATE: Sed Rate: 26 mm/hr — ABNORMAL HIGH (ref 0–16)

## 2017-08-16 LAB — C-REACTIVE PROTEIN: CRP: <0.8 mg/dL (ref <1.0)

## 2017-08-16 LAB — APTT: aPTT: 35 seconds (ref 24–36)

## 2017-08-16 LAB — LACTATE DEHYDROGENASE: LDH: 133 U/L (ref 98–192)

## 2017-08-16 NOTE — Progress Notes (Signed)
Referring physician: [facility]  Diagnosis Thrombocytopenia ([place]) - Plan: CBC with Differential/Platelet, Comprehensive metabolic panel, Lactate dehydrogenase, Protein electrophoresis, serum, Ferritin, APTT, Protime-INR, Pathologist smear review, Hepatitis B surface antibody, Hepatitis B surface antigen, Hepatitis B core antibody, total, Hepatitis panel, acute, C-reactive protein, Rheumatoid factor, Sedimentation rate  Tobacco use - Plan: CBC with Differential/Platelet, Comprehensive metabolic panel, Lactate dehydrogenase, Protein electrophoresis, serum, Ferritin, APTT, Protime-INR, Pathologist smear review, Hepatitis B surface antibody, Hepatitis B surface antigen, Hepatitis B core antibody, total, Hepatitis panel, acute, CT CHEST W CONTRAST, C-reactive protein, Rheumatoid factor, Sedimentation rate  Shortness of breath - Plan: CBC with Differential/Platelet, Comprehensive metabolic panel, Lactate dehydrogenase, Protein electrophoresis, serum, Ferritin, APTT, Protime-INR, Pathologist smear review, Hepatitis B surface antibody, Hepatitis B surface antigen, Hepatitis B core antibody, total, Hepatitis panel, acute, CT CHEST W CONTRAST, C-reactive protein, Rheumatoid factor, Sedimentation rate  Abdominal distension (gaseous) - Plan: CBC with Differential/Platelet, Comprehensive metabolic panel, Lactate dehydrogenase, Protein electrophoresis, serum, Ferritin, APTT, Protime-INR, Pathologist smear review, Hepatitis B surface antibody, Hepatitis B surface antigen, Hepatitis B core antibody, total, Hepatitis panel, acute, CT Abdomen Pelvis W Contrast, C-reactive protein, Rheumatoid factor, Sedimentation rate  Acute cystitis without hematuria - Plan: CBC with Differential/Platelet, Comprehensive metabolic panel, Lactate dehydrogenase, Protein electrophoresis, serum, Ferritin, APTT, Protime-INR, Pathologist smear review, Hepatitis B surface antibody, Hepatitis B surface antigen,  Hepatitis B core antibody, total, Hepatitis panel, acute, CT Abdomen Pelvis W Contrast, C-reactive protein, Rheumatoid factor, Sedimentation rate  Pain in joint involving pelvic region and thigh, unspecified laterality - Plan: CBC with Differential/Platelet, Comprehensive metabolic panel, Lactate dehydrogenase, Protein electrophoresis, serum, Ferritin, APTT, Protime-INR, Pathologist smear review, Hepatitis B surface antibody, Hepatitis B surface antigen, Hepatitis B core antibody, total, Hepatitis panel, acute, C-reactive protein, Rheumatoid factor, Sedimentation rate  Staging Cancer Staging No matching staging information was found for the patient.  Assessment and Plan: 1.  Thrombocytopenia.  Labs were performed today 08/16/2017 showed a white count of 7.9 hemoglobin 12.3 platelets 78,000.  Peripheral smear shows no fragmentation.  He has a normal differential.  Smear is referred for path review.  Chemistries normal with a creatinine of 1.17.  LDH was 133 liver function tests were normal.  The patient has previously undergone HIV and hepatitis C testing that was negative.  I am awaiting results of his PT, PTT, hepatitis A and B serologies.  He reports persistent UTIs and he is set up for a CT of the abdomen and pelvis for further evaluation to determine if he has any liver or spleen abnormalities noted.  The patient will return to clinic in 10 to 14 days to go the results of his labs and scans.  2.  Joint pain.  He reports this is primarily in his hip.  SPEP, C-reactive protein, sed rate, rheumatoid factor obtained.  He reports he is taking Tylenol.  Should follow-up with PCP for ongoing management.  3.  Persistent UTIs.  He reports he has been treated with antibiotics.  Recent urine culture + for serratia.  Patient is set up for CT of the abdomen and pelvis for further evaluation.  He should continue to follow-up with PCP and may be referred to urology if ongoing symptoms.  4.  Smoking/SOB.  Patient  had a prior history of smoking.  Pulse ox is 99% on room air.  He is set up for CT of the chest for further evaluation.  5.  Hypertension.  Blood pressure is 140 1257.  He should continue to follow-up with PCP.  6.  Health maintenance.  Patient will be referred for GI evaluation pending scan review.    HPI: 72 year old male who was referred for evaluation of thrombocytopenia.  He denies any bruising or bleeding.  He has never been told he had problems with his liver.  He is a former smoker.  He reports he has problems with persistent UTIs.  He has also lost 12 pounds.  Review of chart shows patient had labs done July 11, 2017 that showed a white count 7.6 hemoglobin 13.1 platelets were 94,000.  He had a normal differential.  He had repeat labs done on 17 2019 that showed a white count of 7 hemoglobin 12.6 platelets 77,000.  He had a normal differential.  Chemistries showed a creatinine of 1.18, liver function tests were normal.  He denies any fevers, chills, night sweats, and has noted no adenopathy.  He has problems with hip pain which he reports he has been told is due to arthritis.  He denies any family history of leukemias or lymphomas.  Patient is seen today for consultation due to thrombocytopenia.  Problem List Patient Active Problem List   Diagnosis Date Noted  . S/P AVR (aortic valve replacement) [Z95.2] 11/04/2016  . Atrial fibrillation ([place]) [I48.91] 09/27/2016  . Symptomatic bradycardia [R00.1] 06/12/2016  . Urgency incontinence [N39.41] 11/10/2015  . Groin pain [R10.30]   . Hypothyroidism [E03.9] 03/25/2015  . 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 Diastolic CHF ([place]) [N82.95] 01/01/2015  . B12 deficiency [E53.8] 04/08/2014  . Lateral meniscal tear [S83.289A]   . DM type 2, not at goal [facility]) [E11.9] 03/12/2014  . 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 GERD (gastroesophageal reflux disease) [K21.9] 11/02/2012  . Anemia [D64.9] 04/02/2012  . Obesity [E66.9]  08/04/2010  . Benign prostatic hyperplasia [N40.0] 04/15/2010  . CAROTID OCCLUSIVE DISEASE [I65.29] 01/26/2010  . MURMUR [R01.1] 05/27/2009  . Aortic valve disease [I35.9] 05/27/2009  . Hyperlipidemia with target LDL less than 100 [E78.5] 01/22/2009  . Depression [F32.9] 01/22/2009  . Essential hypertension [I10] 01/22/2009    Past Medical History Past Medical History:  Diagnosis Date  . Arthritis   . BPH (benign prostatic hypertrophy) 04/15/2010  . Cataract   . Colon polyps   . DEPRESSION 01/22/2009  . Heart valve replaced   . HYPERCHOLESTEROLEMIA 01/22/2009  . HYPERTENSION 01/22/2009   Dr. [name]  . 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 Morbid obesity ([place])   . Tremors of nervous system    ?ptsd  . Ulcer     Past Surgical History Past Surgical History:  Procedure Laterality Date  . 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 APPENDECTOMY    . bilateral cataract surg    . COLONOSCOPY  04/24/2007   [name]: normal  . COLONOSCOPY WITH ESOPHAGOGASTRODUODENOSCOPY (EGD) N/A 04/05/2012   Procedure: COLONOSCOPY WITH ESOPHAGOGASTRODUODENOSCOPY (EGD);  Surgeon: Daneil Dolin, MD;  Location: [facility];  Service: Endoscopy;  Laterality: N/A;  10;15  . DENTAL SURGERY  05/2004   Dental extractions  . EYE SURGERY Bilateral    cataract  . HEEL SPUR SURGERY Bilateral    resection of heel spur  . KNEE ARTHROSCOPY WITH LATERAL MENISECTOMY Right 04/04/2014   Procedure: KNEE ARTHROSCOPY WITH LATERAL MENISECTOMY;  Surgeon: Carole Civil, MD;  Location: [facility] ORS;  Service: Orthopedics;  Laterality: Right;  . PACEMAKER IMPLANT N/A 06/13/2016   Procedure: Pacemaker Implant- Dual Chamber;  Surgeon: Evans Lance, MD;  Location:  [place] INVASIVE CV LAB;  Service: Cardiovascular;  Laterality: N/A;  . PACEMAKER INSERTION  2018  . THYROIDECTOMY  03/21/2013   DR [name]  . THYROIDECTOMY N/A 03/21/2013   Procedure: THYROIDECTOMY;   Surgeon: [name], MD;  Location: [place];  Service: General;  Laterality: N/A;  . TONSILLECTOMY      Family History Family History  Problem Relation Age of Onset  . Heart disease Father   . Congestive Heart Failure Father   . Arthritis Father   . Diabetes Unknown   . Benign prostatic hyperplasia Brother      Social History  reports that he quit smoking about 55 years ago. His smoking use included cigarettes. He smoked 0.00 packs per day for 12.00 years. He has never used smokeless tobacco. He reports that he does not drink alcohol or use drugs.  Medications  Current Outpatient Medications:  .  acetaminophen (TYLENOL) 500 MG tablet, Take 1,000 mg by mouth every 6 (six) hours as needed (pain)., Disp: , Rfl:  .  cholecalciferol (VITAMIN D) 1000 units tablet, Take 1,000 Units by mouth 2 (two) times daily., Disp: , Rfl:  .  ciprofloxacin (CIPRO) 500 MG tablet, Take 1 tablet (500 mg total) by mouth 2 (two) times daily., Disp: 14 tablet, Rfl: 0 .  fluticasone (FLONASE) 50 MCG/ACT nasal spray, Place 2 sprays into both nostrils daily., Disp: 16 g, Rfl: 6 .  furosemide (LASIX) 20 MG tablet, Take 1 tablet (20 mg total) by mouth daily., Disp: 30 tablet, Rfl: 1 .  glucose blood test strip, 1 strip by Does not apply route 3 (three) times daily. Uses true metrix strips (diabetic club), Disp: , Rfl: 12 .  insulin detemir (LEVEMIR) 100 UNIT/ML injection, Inject 0.47-0.48 mLs (47-48 Units total) into the skin 2 (two) times daily., Disp: 10 mL, Rfl:  .  Insulin Pen Needle (B-D ULTRAFINE III SHORT PEN) 31G X 8 MM MISC, Use to inject Victoza qd, Disp: 100 each, Rfl: 5 .  levothyroxine (SYNTHROID, LEVOTHROID) 200 MCG tablet, Take 1 tablet (200 mcg total) by mouth daily before breakfast., Disp: 30 tablet, Rfl: 1 .  metFORMIN (GLUCOPHAGE) 1000 MG tablet, TAKE 1 TABLET BY MOUTH TWICE DAILY WITH A MEAL., Disp: 180 tablet, Rfl: 0 .  Multiple Vitamins-Minerals (MENS MULTI VITAMIN & MINERAL PO), Take 1 tablet  by mouth daily.  , Disp: , Rfl:  .  Omega-3 Fatty Acids (FISH OIL) 1200 MG CAPS, Take 1,200-2,400 mg by mouth 2 (two) times daily. Take 1 capsule (1200 mg) by mouth daily at noon and 2 capsules (2400 mg) daily at bedtime, Disp: , Rfl:  .  Semaglutide (OZEMPIC) 0.25 or 0.5 MG/DOSE SOPN, Inject into the skin., Disp: , Rfl:  .  simvastatin (ZOCOR) 80 MG tablet, Take 0.5 tablets (40 mg total) by mouth at bedtime., Disp: 15 tablet, Rfl: 0 .  tamsulosin (FLOMAX) 0.4 MG CAPS capsule, Take 2 capsules (0.8 mg total) by mouth at bedtime., Disp: 180 capsule, Rfl: 1  Allergies Phenothiazines and Actos [pioglitazone]  Review of Systems Review of Systems - Oncology ROS negative other than joint pain, weight loss, SOB, UTIs   Physical Exam  Vitals Wt Readings from Last 3 Encounters:  08/16/17 257 lb 11.2 oz (116.9 kg)  08/02/17 254 lb 6.4 oz (115.4 kg)  06/21/17 251 lb 9.6 oz (114.1 kg)   Temp Readings from Last 3 Encounters:  08/16/17 98 F (36.7 C) (Oral)  08/02/17 (!) 97.2 F (36.2 C) (Oral)  06/21/17 (!) 97  F (36.1 C) (Oral)   BP Readings from Last 3 Encounters:  08/16/17 (!) 142/57  08/02/17 (!) 146/69  07/11/17 (!) 142/71   Pulse Readings from Last 3 Encounters:  08/16/17 67  08/02/17 66  07/11/17 73   Constitutional: Well-developed, well-nourished, and in no distress.  Seated in a wheelchair.   HENT: Head: Normocephalic and atraumatic.  Mouth/Throat: No oropharyngeal exudate. Mucosa moist. Eyes: Pupils are equal, round, and reactive to light. Conjunctivae are normal. No scleral icterus.  Neck: Normal range of motion. Neck supple. No JVD present.  Cardiovascular: Normal rate, regular rhythm and normal heart sounds.  Exam reveals no gallop and no friction rub.   No murmur heard. Pulmonary/Chest: Effort normal and breath sounds normal. No respiratory distress. No wheezes.No rales.  Abdominal: Soft. Bowel sounds are normal. No distension. There is no tenderness. There is no  guarding.  Musculoskeletal: No edema or tenderness.  Lymphadenopathy: No cervical, axillary or supraclavicular adenopathy.  Neurological: Alert and oriented to person, place, and time. No cranial nerve deficit.  Skin: Skin is warm and dry. No rash noted. No erythema. No pallor.  Psychiatric: Affect and judgment normal.   Labs Appointment on 08/16/2017  Component Date Value Ref Range Status  . WBC 08/16/2017 7.9  4.0 - 10.5 K/uL Final  . RBC 08/16/2017 3.84* 4.22 - 5.81 MIL/uL Final  . Hemoglobin 08/16/2017 12.3* 13.0 - 17.0 g/dL Final  . HCT 08/16/2017 37.2* 39.0 - 52.0 % Final  . MCV 08/16/2017 96.9  78.0 - 100.0 fL Final  . MCH 08/16/2017 32.0  26.0 - 34.0 pg Final  . MCHC 08/16/2017 33.1  30.0 - 36.0 g/dL Final  . RDW 08/16/2017 15.4  11.5 - 15.5 % Final  . Platelets 08/16/2017 78* 150 - 400 K/uL Final   Comment: PLATELET COUNT CONFIRMED BY SMEAR SPECIMEN CHECKED FOR CLOTS   . Neutrophils Relative % 08/16/2017 64  % Final  . Neutro Abs 08/16/2017 5.1  1.7 - 7.7 K/uL Final  . Lymphocytes Relative 08/16/2017 26  % Final  . Lymphs Abs 08/16/2017 2.1  0.7 - 4.0 K/uL Final  . Monocytes Relative 08/16/2017 7  % Final  . Monocytes Absolute 08/16/2017 0.5  0.1 - 1.0 K/uL Final  . Eosinophils Relative 08/16/2017 3  % Final  . Eosinophils Absolute 08/16/2017 0.2  0.0 - 0.7 K/uL Final  . Basophils Relative 08/16/2017 0  % Final  . Basophils Absolute 08/16/2017 0.0  0.0 - 0.1 K/uL Final   Performed at [facility], [address], [place], [place] [address]  . Sodium 08/16/2017 136  135 - 145 mmol/L Final  . Potassium 08/16/2017 4.2  3.5 - 5.1 mmol/L Final  . Chloride 08/16/2017 103  98 - 111 mmol/L Final  . CO2 08/16/2017 25  22 - 32 mmol/L Final  . Glucose, Bld 08/16/2017 233* 70 - 99 mg/dL Final  . BUN 08/16/2017 26* 8 - 23 mg/dL Final  . Creatinine, Ser 08/16/2017 1.17  0.61 - 1.24 mg/dL Final  . Calcium 08/16/2017 8.9  8.9 - 10.3 mg/dL Final  . Total Protein 08/16/2017 6.9  6.5 -  8.1 g/dL Final  . Albumin 08/16/2017 3.7  3.5 - 5.0 g/dL Final  . AST 08/16/2017 18  15 - 41 U/L Final  . ALT 08/16/2017 15  0 - 44 U/L Final  . Alkaline Phosphatase 08/16/2017 50  38 - 126 U/L Final  . Total Bilirubin 08/16/2017 0.7  0.3 - 1.2 mg/dL Final  . GFR calc  non Af Amer 08/16/2017 >60  >60 mL/min Final  . GFR calc Af Amer 08/16/2017 >60  >60 mL/min Final   Comment: (NOTE) The eGFR has been calculated using the CKD EPI equation. This calculation has not been validated in all clinical situations. eGFR's persistently <60 mL/min signify possible Chronic Kidney Disease.   Georgiann Hahn gap 08/16/2017 8  5 - 15 Final   Performed at [facility], [address] [place], [place] [address]  . LDH 08/16/2017 133  98 - 192 U/L Final   Performed at [facility], [address] [place], [place] [address]  . aPTT 08/16/2017 35  24 - 36 seconds Final   Performed at [facility], [address], [place], [place] [address]  . Prothrombin Time 08/16/2017 17.2* 11.4 - 15.2 seconds Final  . INR 08/16/2017 1.42   Final   Performed at [facility], [address] [place], [place] [address]  . Sed Rate 08/16/2017 26* 0 - 16 mm/hr Final   Performed at [facility], [address] [place], [place] [address]     Pathology Orders Placed This Encounter  Procedures  . CT CHEST W CONTRAST    Standing Status:   Future    Standing Expiration Date:   08/16/2018    Order Specific Question:   If indicated for the ordered procedure, I authorize the administration of contrast media per Radiology protocol    Answer:   Yes    Order Specific Question:   Preferred imaging location?    Answer:   [facility]    Order Specific Question:   Radiology Contrast Protocol - do NOT remove file path    Answer:   \\[email or web address]  . CT Abdomen Pelvis W Contrast    Standing Status:   Future    Standing Expiration Date:   08/16/2018    Order Specific Question:   ** REASON FOR EXAM (FREE TEXT)     Answer:   thrombocytopenia    Order Specific Question:   If indicated for the ordered procedure, I authorize the administration of contrast media per Radiology protocol    Answer:   Yes    Order Specific Question:   Preferred imaging location?    Answer:   [facility]    Order Specific Question:   Is Oral Contrast requested for this exam?    Answer:   Yes, Per Radiology protocol    Order Specific Question:   Radiology Contrast Protocol - do NOT remove file path    Answer:   \\[email or web address]  . CBC with Differential/Platelet    Standing Status:   Future    Number of Occurrences:   1    Standing Expiration Date:   08/17/2018  . Comprehensive metabolic panel    Standing Status:   Future    Number of Occurrences:   1    Standing Expiration Date:   08/17/2018  . Lactate dehydrogenase    Standing Status:   Future    Number of Occurrences:   1    Standing Expiration Date:   08/17/2018  . Protein electrophoresis, serum    Standing Status:   Future    Number of Occurrences:   1    Standing Expiration Date:   08/17/2018  . Ferritin    Standing Status:   Future    Number of Occurrences:   1    Standing Expiration Date:   08/17/2018  . APTT    Standing Status:  Future    Number of Occurrences:   1    Standing Expiration Date:   08/16/2018  . Protime-INR    Standing Status:   Future    Number of Occurrences:   1    Standing Expiration Date:   08/16/2018  . Pathologist smear review    Standing Status:   Future    Number of Occurrences:   1    Standing Expiration Date:   08/16/2018  . Hepatitis B surface antibody    Standing Status:   Future    Number of Occurrences:   1    Standing Expiration Date:   08/16/2018  . Hepatitis B surface antigen    Standing Status:   Future    Number of Occurrences:   1    Standing Expiration Date:   08/16/2018  . Hepatitis B core antibody, total  . Hepatitis panel, acute    Standing Status:   Future    Number of  Occurrences:   1    Standing Expiration Date:   08/16/2018  . C-reactive protein    Standing Status:   Future    Number of Occurrences:   1    Standing Expiration Date:   08/16/2018  . Rheumatoid factor    Standing Status:   Future    Number of Occurrences:   1    Standing Expiration Date:   08/16/2018  . Sedimentation rate    Standing Status:   Future    Number of Occurrences:   1    Standing Expiration Date:   08/16/2018       Zoila Shutter MD

## 2017-08-16 NOTE — Patient Instructions (Signed)
[facility] at [facility] Discharge Instructions  Today you saw Dr. Higgs.    Thank you for choosing [facility] at [facility] to provide your oncology and hematology care.  To afford each patient quality time with our provider, please arrive at least 15 minutes before your scheduled appointment time.   If you have a lab appointment with the Cancer Center please come in thru the  Main Entrance and check in at the main information desk  You need to re-schedule your appointment should you arrive 10 or more minutes late.  We strive to give you quality time with our providers, and arriving late affects you and other patients whose appointments are after yours.  Also, if you no show three or more times for appointments you may be dismissed from the clinic at the providers discretion.     Again, thank you for choosing [facility].  Our hope is that these requests will decrease the amount of time that you wait before being seen by our physicians.       _____________________________________________________________  Should you have questions after your visit to [facility], please contact our office at [phone] between the hours of 8:30 a.m. and 4:30 p.m.  Voicemails left after 4:30 p.m. will not be returned until the following business day.  For prescription refill requests, have your pharmacy contact our office.       Resources For Cancer Patients and their Caregivers ? American Cancer Society: Can assist with transportation, wigs, general needs, runs Look Good Feel Better.        1-888-227-6333 ? Cancer Care: Provides financial assistance, online support groups, medication/co-pay assistance.  1-800-813-HOPE (4673) ? [facility] Assists [place] cancer patients and their families through emotional , educational and financial support.  336-427-4357 ? [place] DSS Where to apply for  food stamps, Medicaid and utility assistance. 336-342-1394 ? [facility]: Transportation to medical appointments. 336-347-2287 ? Social Security Administration: May apply for disability if have a Stage IV cancer. 336-342-7796 1-800-772-1213 ? [place] Aging, Disability and Transit Services: Assists with nutrition, care and transit needs. 336-349-2343  Cancer Center Support Programs:   > Cancer Support Group  2nd Tuesday of the month 1pm-2pm, Journey Room   > Creative Journey  3rd Tuesday of the month 1130am-1pm, Journey Room    

## 2017-08-17 LAB — HEPATITIS PANEL, ACUTE
HCV Ab: <0.1 s/co ratio (ref 0.0–0.9)
HEP A IGM: NEGATIVE
HEP B C IGM: NEGATIVE
HEP B S AG: NEGATIVE

## 2017-08-17 LAB — PROTEIN ELECTROPHORESIS, SERUM
A/G Ratio: 1.2 (ref 0.7–1.7)
Albumin ELP: 3.5 g/dL (ref 2.9–4.4)
Alpha-1-Globulin: 0.2 g/dL (ref 0.0–0.4)
Alpha-2-Globulin: 0.8 g/dL (ref 0.4–1.0)
BETA GLOBULIN: 1.1 g/dL (ref 0.7–1.3)
GAMMA GLOBULIN: 1 g/dL (ref 0.4–1.8)
Globulin, Total: 3 g/dL (ref 2.2–3.9)
Total Protein ELP: 6.5 g/dL (ref 6.0–8.5)

## 2017-08-17 LAB — HEPATITIS B SURFACE ANTIBODY,QUALITATIVE: Hep B S Ab: NONREACTIVE

## 2017-08-17 LAB — RHEUMATOID FACTOR

## 2017-08-17 LAB — HEPATITIS B SURFACE ANTIGEN: HEP B S AG: NEGATIVE

## 2017-08-17 LAB — PATHOLOGIST SMEAR REVIEW

## 2017-08-17 LAB — HEPATITIS B CORE ANTIBODY, TOTAL: HEP B C TOTAL AB: NEGATIVE

## 2017-08-30 ENCOUNTER — Ambulatory Visit (HOSPITAL_COMMUNITY)
Admission: RE | Admit: 2017-08-30 | Discharge: 2017-08-30 | Disposition: A | Discharge status: Home / Self Care | Payer: Medicare Other | Source: Ambulatory Visit | Attending: Internal Medicine | Admitting: Internal Medicine

## 2017-08-30 DIAGNOSIS — R0602 Shortness of breath: Secondary | ICD-10-CM | POA: Diagnosis not present

## 2017-08-30 DIAGNOSIS — Z720 Tobacco use: Secondary | ICD-10-CM | POA: Diagnosis not present

## 2017-08-30 DIAGNOSIS — I700 Atherosclerosis of aorta: Secondary | ICD-10-CM | POA: Insufficient documentation

## 2017-08-30 DIAGNOSIS — N3000 Acute cystitis without hematuria: Secondary | ICD-10-CM

## 2017-08-30 DIAGNOSIS — R140 Abdominal distension (gaseous): Secondary | ICD-10-CM | POA: Diagnosis not present

## 2017-08-30 DIAGNOSIS — Z951 Presence of aortocoronary bypass graft: Secondary | ICD-10-CM | POA: Diagnosis not present

## 2017-08-30 DIAGNOSIS — N390 Urinary tract infection, site not specified: Secondary | ICD-10-CM | POA: Diagnosis not present

## 2017-08-30 DIAGNOSIS — D696 Thrombocytopenia, unspecified: Secondary | ICD-10-CM | POA: Diagnosis not present

## 2017-08-30 DIAGNOSIS — J984 Other disorders of lung: Secondary | ICD-10-CM | POA: Diagnosis not present

## 2017-08-30 MED ORDER — IOPAMIDOL (ISOVUE-300) INJECTION 61%
100.0000 mL | Freq: Once | INTRAVENOUS | Status: AC | PRN
  Administered 2017-08-30: 100 mL via INTRAVENOUS

## 2017-09-01 ENCOUNTER — Ambulatory Visit (HOSPITAL_COMMUNITY): Payer: Medicare Other | Admitting: Internal Medicine

## 2017-09-04 ENCOUNTER — Ambulatory Visit (HOSPITAL_COMMUNITY): Payer: Medicare Other | Admitting: Internal Medicine

## 2017-09-05 ENCOUNTER — Encounter (HOSPITAL_COMMUNITY): Payer: Self-pay | Admitting: Internal Medicine

## 2017-09-05 ENCOUNTER — Inpatient Hospital Stay (HOSPITAL_COMMUNITY): Payer: Medicare Other | Attending: Internal Medicine | Admitting: Internal Medicine

## 2017-09-05 VITALS — BP 129/64 | HR 83 | Temp 98.3°F | Resp 16 | Wt 257.4 lb

## 2017-09-05 DIAGNOSIS — E538 Deficiency of other specified B group vitamins: Secondary | ICD-10-CM | POA: Diagnosis not present

## 2017-09-05 DIAGNOSIS — Z794 Long term (current) use of insulin: Secondary | ICD-10-CM | POA: Diagnosis not present

## 2017-09-05 DIAGNOSIS — I4891 Unspecified atrial fibrillation: Secondary | ICD-10-CM | POA: Diagnosis not present

## 2017-09-05 DIAGNOSIS — N3000 Acute cystitis without hematuria: Secondary | ICD-10-CM | POA: Diagnosis not present

## 2017-09-05 DIAGNOSIS — N400 Enlarged prostate without lower urinary tract symptoms: Secondary | ICD-10-CM | POA: Insufficient documentation

## 2017-09-05 DIAGNOSIS — I10 Essential (primary) hypertension: Secondary | ICD-10-CM | POA: Diagnosis not present

## 2017-09-05 DIAGNOSIS — I359 Nonrheumatic aortic valve disorder, unspecified: Secondary | ICD-10-CM | POA: Insufficient documentation

## 2017-09-05 DIAGNOSIS — E785 Hyperlipidemia, unspecified: Secondary | ICD-10-CM

## 2017-09-05 DIAGNOSIS — I700 Atherosclerosis of aorta: Secondary | ICD-10-CM | POA: Diagnosis not present

## 2017-09-05 DIAGNOSIS — I5030 Unspecified diastolic (congestive) heart failure: Secondary | ICD-10-CM | POA: Insufficient documentation

## 2017-09-05 DIAGNOSIS — E1165 Type 2 diabetes mellitus with hyperglycemia: Secondary | ICD-10-CM | POA: Diagnosis not present

## 2017-09-05 DIAGNOSIS — Z87891 Personal history of nicotine dependence: Secondary | ICD-10-CM | POA: Insufficient documentation

## 2017-09-05 DIAGNOSIS — G4730 Sleep apnea, unspecified: Secondary | ICD-10-CM | POA: Diagnosis not present

## 2017-09-05 DIAGNOSIS — E890 Postprocedural hypothyroidism: Secondary | ICD-10-CM

## 2017-09-05 DIAGNOSIS — M2550 Pain in unspecified joint: Secondary | ICD-10-CM

## 2017-09-05 DIAGNOSIS — E669 Obesity, unspecified: Secondary | ICD-10-CM | POA: Diagnosis not present

## 2017-09-05 DIAGNOSIS — K219 Gastro-esophageal reflux disease without esophagitis: Secondary | ICD-10-CM | POA: Diagnosis not present

## 2017-09-05 DIAGNOSIS — D649 Anemia, unspecified: Secondary | ICD-10-CM

## 2017-09-05 DIAGNOSIS — D696 Thrombocytopenia, unspecified: Secondary | ICD-10-CM | POA: Insufficient documentation

## 2017-09-05 MED ORDER — SULFAMETHOXAZOLE-TRIMETHOPRIM 800-160 MG PO TABS: 1.0000 | ORAL_TABLET | Freq: Two times a day (BID) | ORAL | 0 refills | Status: AC

## 2017-09-05 NOTE — Patient Instructions (Signed)
[facility] at [facility] Discharge Instructions  You saw Dr. Higgs today.   Thank you for choosing [facility] at [facility] to provide your oncology and hematology care.  To afford each patient quality time with our provider, please arrive at least 15 minutes before your scheduled appointment time.   If you have a lab appointment with the Cancer Center please come in thru the  Main Entrance and check in at the main information desk  You need to re-schedule your appointment should you arrive 10 or more minutes late.  We strive to give you quality time with our providers, and arriving late affects you and other patients whose appointments are after yours.  Also, if you no show three or more times for appointments you may be dismissed from the clinic at the providers discretion.     Again, thank you for choosing [facility].  Our hope is that these requests will decrease the amount of time that you wait before being seen by our physicians.       _____________________________________________________________  Should you have questions after your visit to [facility], please contact our office at [phone] between the hours of 8:00 a.m. and 4:30 p.m.  Voicemails left after 4:00 p.m. will not be returned until the following business day.  For prescription refill requests, have your pharmacy contact our office and allow 72 hours.    Cancer Center Support Programs:   > Cancer Support Group  2nd Tuesday of the month 1pm-2pm, Journey Room    

## 2017-09-05 NOTE — Progress Notes (Signed)
Diagnosis Acute cystitis without hematuria - Plan: CBC with Differential/Platelet, Comprehensive metabolic panel, Lactate dehydrogenase, Urinalysis, Routine w reflex microscopic, Urine culture, CBC with Differential/Platelet, Comprehensive metabolic panel, Lactate dehydrogenase, Urinalysis, Routine w reflex microscopic, Urine culture  Thrombocytopenia (HCC) - Plan: CBC with Differential/Platelet, Comprehensive metabolic panel, Lactate dehydrogenase, Urinalysis, Routine w reflex microscopic, Urine culture, CBC with Differential/Platelet, Comprehensive metabolic panel, Lactate dehydrogenase, Urinalysis, Routine w reflex microscopic, Urine culture  Staging Cancer Staging No matching staging information was found for the patient.  Assessment and Plan:  1.  Thrombocytopenia.  Labs performed  08/16/2017 reviewed and showed a white count of 7.9 hemoglobin 12.3 platelets 78,000.  Peripheral smear shows no fragmentation.  He has a normal differential.  Path review shows thrombocytopenia.  Chemistries normal with a creatinine of 1.17.  LDH was 133 liver function tests were normal.  The patient has previously undergone HIV and hepatitis C testing that was negative.  He has normal PT, PTT, hepatitis A and B serologies.    CT of the chest, abdomen and pelvis done 08/30/2017 reviewed and shows  IMPRESSION: Chest Impression:  1. Post CABG. 2. No acute pulmonary findings.  Abdomen / Pelvis Impression:  1. Normal spleen.  No lymphadenopathy. 2. Normal kidneys. 3.  Aortic Atherosclerosis (ICD10-I70.0).  I have discussed with him scans are normal.  Review of record shows he had thrombocytopenia dating back to 04/2017.  At that time he was diagnosed with Serratia UTI.  He denies being treated for UTI.  I have discussed with him infection could play a role in thrombocytopenia.  He will be placed on Bactrim DS 1 tab bid for 10 days and will have repeat labs and UA and culture done for follow-up to determine if  platelet count improves with UTI therapy.    2.  UTI.  Review of record shows he had thrombocytopenia dating back to 04/2017.  At that time he was diagnosed with Serratia UTI.  He denies being treated for UTI.  I have discussed with him infection could play a role in thrombocytopenia.  He will be placed on Bactrim DS 1 tab bid for 10 days and will have repeat labs and UA and culture done for follow-up to determine if platelet count improves with UTI therapy and to determine if urine culture improves.  If no resolution after 10 days of abx will refer to urology.    3.  Joint pain.  He reports this is primarily in his hip.  SPEP, C-reactive protein, sed rate, rheumatoid factor  Are negative.  Follow-up with PCP for ongoing management.  4.  Smoking/SOB.  Patient had a prior history of smoking.  CT of the chest done 08/30/2017 reviewed and was negative.     5.  Hypertension.  Blood pressure is 149/64.  Follow-up with PCP.  6.  Insomnia.  Option of Melatonin given.  Follow-up with PCP if ongoing problems.    Greater than 30 minutes spent with more than 50% spent in counseling and coordination of care.      Interval history:  Historical data obtained from the note dated 08/16/2017.  72 year old male who was referred for evaluation of thrombocytopenia.  He denies any bruising or bleeding.  He has never been told he had problems with his liver.  He is a former smoker.  He reports he has problems with persistent UTIs.  He has also lost 12 pounds.  Review of chart shows patient had labs done July 11, 2017 that showed a white count 7.6  hemoglobin 13.1 platelets were 94,000.  He had a normal differential.  He had repeat labs done on 17 2019 that showed a white count of 7 hemoglobin 12.6 platelets 77,000.  He had a normal differential.  Chemistries showed a creatinine of 1.18, liver function tests were normal.  He denies any fevers, chills, night sweats, and has noted no adenopathy.  He has problems with hip pain  which he reports he has been told is due to arthritis.  He denies any family history of leukemias or lymphomas.    Current Status:  Pt is here today for follow-up.  He is here to go over labs and scans.     Problem List Patient Active Problem List   Diagnosis Date Noted  . S/P AVR (aortic valve replacement) [Z95.2] 11/04/2016  . Atrial fibrillation ([place]) [I48.91] 09/27/2016  . Symptomatic bradycardia [R00.1] 06/12/2016  . Urgency incontinence [N39.41] 11/10/2015  . Groin pain [R10.30]   . Hypothyroidism [E03.9] 03/25/2015  . 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 Diastolic CHF ([place]) [M54.65] 01/01/2015  . B12 deficiency [E53.8] 04/08/2014  . Lateral meniscal tear [S83.289A]   . DM type 2, not at goal [facility]) [E11.9] 03/12/2014  . 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 GERD (gastroesophageal reflux disease) [K21.9] 11/02/2012  . Anemia [D64.9] 04/02/2012  . Obesity [E66.9] 08/04/2010  . Benign prostatic hyperplasia [N40.0] 04/15/2010  . CAROTID OCCLUSIVE DISEASE [I65.29] 01/26/2010  . MURMUR [R01.1] 05/27/2009  . Aortic valve disease [I35.9] 05/27/2009  . Hyperlipidemia with target LDL less than 100 [E78.5] 01/22/2009  . Depression [F32.9] 01/22/2009  . Essential hypertension [I10] 01/22/2009    Past Medical History Past Medical History:  Diagnosis Date  . Arthritis   . BPH (benign prostatic hypertrophy) 04/15/2010  . Cataract   . Colon polyps   . DEPRESSION 01/22/2009  . Heart valve replaced   . HYPERCHOLESTEROLEMIA 01/22/2009  . HYPERTENSION 01/22/2009   Dr. [name]  . 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 Morbid obesity ([place])   . Tremors of nervous system    ?ptsd  . Ulcer     Past Surgical History Past Surgical History:  Procedure Laterality Date  . 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 APPENDECTOMY    . bilateral cataract surg    . COLONOSCOPY  04/24/2007   [name]: normal  .  COLONOSCOPY WITH ESOPHAGOGASTRODUODENOSCOPY (EGD) N/A 04/05/2012   Procedure: COLONOSCOPY WITH ESOPHAGOGASTRODUODENOSCOPY (EGD);  Surgeon: Daneil Dolin, MD;  Location: [facility];  Service: Endoscopy;  Laterality: N/A;  10;15  . DENTAL SURGERY  05/2004   Dental extractions  . EYE SURGERY Bilateral    cataract  . HEEL SPUR SURGERY Bilateral    resection of heel spur  . KNEE ARTHROSCOPY WITH LATERAL MENISECTOMY Right 04/04/2014   Procedure: KNEE ARTHROSCOPY WITH LATERAL MENISECTOMY;  Surgeon: Carole Civil, MD;  Location: [facility];  Service: Orthopedics;  Laterality: Right;  . PACEMAKER IMPLANT N/A 06/13/2016   Procedure: Pacemaker Implant- Dual Chamber;  Surgeon: Evans Lance, MD;  Location: [place] [facility] LAB;  Service: Cardiovascular;  Laterality: N/A;  . PACEMAKER INSERTION  2018  . THYROIDECTOMY  03/21/2013   DR [name]  . THYROIDECTOMY N/A 03/21/2013   Procedure: THYROIDECTOMY;  Surgeon: [name], MD;  Location: [place];  Service: General;  Laterality: N/A;  . TONSILLECTOMY      Family History Family History  Problem Relation Age of Onset  . Heart  disease Father   . Congestive Heart Failure Father   . Arthritis Father   . Diabetes Unknown   . Benign prostatic hyperplasia Brother      Social History  reports that he quit smoking about 55 years ago. His smoking use included cigarettes. He smoked 0.00 packs per day for 12.00 years. He has never used smokeless tobacco. He reports that he does not drink alcohol or use drugs.  Medications  Current Outpatient Medications:  .  acetaminophen (TYLENOL) 500 MG tablet, Take 1,000 mg by mouth every 6 (six) hours as needed (pain)., Disp: , Rfl:  .  cholecalciferol (VITAMIN D) 1000 units tablet, Take 1,000 Units by mouth 2 (two) times daily., Disp: , Rfl:  .  ciprofloxacin (CIPRO) 500 MG tablet, Take 1 tablet (500 mg total) by mouth 2 (two) times daily., Disp: 14 tablet, Rfl: 0 .  fluticasone (FLONASE) 50 MCG/ACT nasal spray,  Place 2 sprays into both nostrils daily., Disp: 16 g, Rfl: 6 .  furosemide (LASIX) 20 MG tablet, Take 1 tablet (20 mg total) by mouth daily., Disp: 30 tablet, Rfl: 1 .  glucose blood test strip, 1 strip by Does not apply route 3 (three) times daily. Uses true metrix strips (diabetic club), Disp: , Rfl: 12 .  insulin detemir (LEVEMIR) 100 UNIT/ML injection, Inject 0.47-0.48 mLs (47-48 Units total) into the skin 2 (two) times daily., Disp: 10 mL, Rfl:  .  Insulin Pen Needle (B-D ULTRAFINE III SHORT PEN) 31G X 8 MM MISC, Use to inject Victoza qd, Disp: 100 each, Rfl: 5 .  levothyroxine (SYNTHROID, LEVOTHROID) 200 MCG tablet, Take 1 tablet (200 mcg total) by mouth daily before breakfast., Disp: 30 tablet, Rfl: 1 .  metFORMIN (GLUCOPHAGE) 1000 MG tablet, TAKE 1 TABLET BY MOUTH TWICE DAILY WITH A MEAL., Disp: 180 tablet, Rfl: 0 .  Multiple Vitamins-Minerals (MENS MULTI VITAMIN & MINERAL PO), Take 1 tablet by mouth daily.  , Disp: , Rfl:  .  Omega-3 Fatty Acids (FISH OIL) 1200 MG CAPS, Take 1,200-2,400 mg by mouth 2 (two) times daily. Take 1 capsule (1200 mg) by mouth daily at noon and 2 capsules (2400 mg) daily at bedtime, Disp: , Rfl:  .  Semaglutide (OZEMPIC) 0.25 or 0.5 MG/DOSE SOPN, Inject into the skin., Disp: , Rfl:  .  simvastatin (ZOCOR) 80 MG tablet, Take 0.5 tablets (40 mg total) by mouth at bedtime., Disp: 15 tablet, Rfl: 0 .  tamsulosin (FLOMAX) 0.4 MG CAPS capsule, Take 2 capsules (0.8 mg total) by mouth at bedtime., Disp: 180 capsule, Rfl: 1 .  sulfamethoxazole-trimethoprim (BACTRIM DS,SEPTRA DS) 800-160 MG tablet, Take 1 tablet by mouth 2 (two) times daily for 10 days., Disp: 20 tablet, Rfl: 0  Allergies Phenothiazines and Actos [pioglitazone]  Review of Systems Review of Systems - Oncology ROS negative other than UTI and insomnia   Physical Exam  Vitals Wt Readings from Last 3 Encounters:  09/05/17 257 lb 6.4 oz (116.8 kg)  08/16/17 257 lb 11.2 oz (116.9 kg)  08/02/17 254 lb 6.4  oz (115.4 kg)   Temp Readings from Last 3 Encounters:  09/05/17 98.3 F (36.8 C) (Oral)  08/16/17 98 F (36.7 C) (Oral)  08/02/17 (!) 97.2 F (36.2 C) (Oral)   BP Readings from Last 3 Encounters:  09/05/17 129/64  08/16/17 (!) 142/57  08/02/17 (!) 146/69   Pulse Readings from Last 3 Encounters:  09/05/17 83  08/16/17 67  08/02/17 66    Constitutional: Well-developed, well-nourished, and in no  distress.   HENT: Head: Normocephalic and atraumatic.  Mouth/Throat: No oropharyngeal exudate. Mucosa moist. Eyes: Pupils are equal, round, and reactive to light. Conjunctivae are normal. No scleral icterus.  Neck: Normal range of motion. Neck supple. No JVD present.  Cardiovascular: Normal rate, regular rhythm and normal heart sounds.  Exam reveals no gallop and no friction rub.   No murmur heard. Pulmonary/Chest: Effort normal and breath sounds normal. No respiratory distress. No wheezes.No rales.  Abdominal: Soft. Bowel sounds are normal. No distension. There is no tenderness. There is no guarding.  Musculoskeletal: No edema or tenderness.  Lymphadenopathy: No cervical, axillary or supraclavicular adenopathy.  Neurological: Alert and oriented to person, place, and time. No cranial nerve deficit.  Skin: Skin is warm and dry. No rash noted. No erythema. No pallor.  Psychiatric: Affect and judgment normal.   Labs No visits with results within 3 Day(s) from this visit.  Latest known visit with results is:  Appointment on 08/16/2017  Component Date Value Ref Range Status  . WBC 08/16/2017 7.9  4.0 - 10.5 K/uL Final  . RBC 08/16/2017 3.84* 4.22 - 5.81 MIL/uL Final  . Hemoglobin 08/16/2017 12.3* 13.0 - 17.0 g/dL Final  . HCT 08/16/2017 37.2* 39.0 - 52.0 % Final  . MCV 08/16/2017 96.9  78.0 - 100.0 fL Final  . MCH 08/16/2017 32.0  26.0 - 34.0 pg Final  . MCHC 08/16/2017 33.1  30.0 - 36.0 g/dL Final  . RDW 08/16/2017 15.4  11.5 - 15.5 % Final  . Platelets 08/16/2017 78* 150 - 400 K/uL  Final   Comment: PLATELET COUNT CONFIRMED BY SMEAR SPECIMEN CHECKED FOR CLOTS   . Neutrophils Relative % 08/16/2017 64  % Final  . Neutro Abs 08/16/2017 5.1  1.7 - 7.7 K/uL Final  . Lymphocytes Relative 08/16/2017 26  % Final  . Lymphs Abs 08/16/2017 2.1  0.7 - 4.0 K/uL Final  . Monocytes Relative 08/16/2017 7  % Final  . Monocytes Absolute 08/16/2017 0.5  0.1 - 1.0 K/uL Final  . Eosinophils Relative 08/16/2017 3  % Final  . Eosinophils Absolute 08/16/2017 0.2  0.0 - 0.7 K/uL Final  . Basophils Relative 08/16/2017 0  % Final  . Basophils Absolute 08/16/2017 0.0  0.0 - 0.1 K/uL Final   Performed at [facility] [address] [place], [place] [address]  . Sodium 08/16/2017 136  135 - 145 mmol/L Final  . Potassium 08/16/2017 4.2  3.5 - 5.1 mmol/L Final  . Chloride 08/16/2017 103  98 - 111 mmol/L Final  . CO2 08/16/2017 25  22 - 32 mmol/L Final  . Glucose, Bld 08/16/2017 233* 70 - 99 mg/dL Final  . BUN 08/16/2017 26* 8 - 23 mg/dL Final  . Creatinine, Ser 08/16/2017 1.17  0.61 - 1.24 mg/dL Final  . Calcium 08/16/2017 8.9  8.9 - 10.3 mg/dL Final  . Total Protein 08/16/2017 6.9  6.5 - 8.1 g/dL Final  . Albumin 08/16/2017 3.7  3.5 - 5.0 g/dL Final  . AST 08/16/2017 18  15 - 41 U/L Final  . ALT 08/16/2017 15  0 - 44 U/L Final  . Alkaline Phosphatase 08/16/2017 50  38 - 126 U/L Final  . Total Bilirubin 08/16/2017 0.7  0.3 - 1.2 mg/dL Final  . GFR calc non Af Amer 08/16/2017 >60  >60 mL/min Final  . GFR calc Af Amer 08/16/2017 >60  >60 mL/min Final   Comment: (NOTE) The eGFR has been calculated using the CKD EPI equation. This calculation  has not been validated in all clinical situations. eGFR's persistently <60 mL/min signify possible Chronic Kidney Disease.   Georgiann Hahn gap 08/16/2017 8  5 - 15 Final   Performed at [facility], [address] [place], [place] [address]  . LDH 08/16/2017 133  98 - 192 U/L Final   Performed at [facility], [address] [place], [place] [address]  .  Total Protein ELP 08/16/2017 6.5  6.0 - 8.5 g/dL Final  . Albumin ELP 08/16/2017 3.5  2.9 - 4.4 g/dL Final  . Alpha-1-Globulin 08/16/2017 0.2  0.0 - 0.4 g/dL Final  . Alpha-2-Globulin 08/16/2017 0.8  0.4 - 1.0 g/dL Final  . Beta Globulin 08/16/2017 1.1  0.7 - 1.3 g/dL Final  . Gamma Globulin 08/16/2017 1.0  0.4 - 1.8 g/dL Final  . M-Spike, % 08/16/2017 Not Observed  Not Observed g/dL Final  . SPE Interp. 08/16/2017 Comment   Final   Comment: (NOTE) The SPE pattern appears essentially unremarkable. Evidence of monoclonal protein is not apparent. Performed At: [facility] [place], [place] [id number] Rush Farmer MD QB:[id number]   . Comment 08/16/2017 Comment   Final   Comment: (NOTE) Protein electrophoresis scan will follow via computer, mail, or courier delivery.   Marland Kitchen GLOBULIN, TOTAL 08/16/2017 3.0  2.2 - 3.9 g/dL Corrected  . A/G Ratio 08/16/2017 1.2  0.7 - 1.7 Corrected  . Ferritin 08/16/2017 322  24 - 336 ng/mL Final   Performed at [facility] [address], [place], [place] [address]  . aPTT 08/16/2017 35  24 - 36 seconds Final   Performed at [facility], [address] [place], [place] [address]  . Prothrombin Time 08/16/2017 17.2* 11.4 - 15.2 seconds Final  . INR 08/16/2017 1.42   Final   Performed at [facility], [address], [place], [place] [address]  . Path Review 08/16/2017 Reviewed By Violet Baldy, M.D.   Final   Comment: 7.25.19 NORMOCYTIC ANEMIA AND THROMBOCYTOPENIA Performed at [facility] [address] [place], [place] [address]   . Hep B S Ab 08/16/2017 Non Reactive   Final   Comment: (NOTE)              Non Reactive: Inconsistent with immunity,                            less than 10 mIU/mL              Reactive:     Consistent with immunity,                            greater than 9.9 mIU/mL Performed At: [facility] [place], [place] [id number] Rush Farmer MD  JH:[id number]   . Hepatitis B Surface Ag 08/16/2017 Negative  Negative Final   Comment: (NOTE) Performed At: [facility] [place], [place] [id number] Rush Farmer MD WY:[id number]   . Hepatitis B Surface Ag 08/16/2017 Negative  Negative Final  . HCV Ab 08/16/2017 <0.1  0.0 - 0.9 s/co ratio Final   Comment: (NOTE)                                  Negative:     < 0.8  Indeterminate: 0.8 - 0.9                                  Positive:     > 0.9 The CDC recommends that a positive HCV antibody result be followed up with a HCV Nucleic Acid Amplification test (638466). Performed At: [facility] [place], [place] [id number] Rush Farmer MD BL:[id number]   . Hep A IgM 08/16/2017 Negative  Negative Final  . Hep B C IgM 08/16/2017 Negative  Negative Final  . CRP 08/16/2017 <0.8  <1.0 mg/dL Final   Performed at [facility], [place] [address] [place], [place] [address]  . Rhuematoid fact SerPl-aCnc 08/16/2017 <10.0  0.0 - 13.9 IU/mL Final   Comment: (NOTE) Performed At: [facility] [place], [place] [id number] Rush Farmer MD YB:[id number]   . Sed Rate 08/16/2017 26* 0 - 16 mm/hr Final   Performed at [facility], [address] [place], [place] [address]     Pathology Orders Placed This Encounter  Procedures  . Urine culture    Standing Status:   Future    Standing Expiration Date:   09/06/2018  . Urine culture    Standing Status:   Future    Standing Expiration Date:   09/06/2018  . CBC with Differential/Platelet    Standing Status:   Future    Standing Expiration Date:   09/06/2018  . Comprehensive metabolic panel    Standing Status:   Future    Standing Expiration Date:   09/06/2018  . Lactate dehydrogenase    Standing Status:   Future    Standing Expiration Date:   09/06/2018  . Urinalysis, Routine w reflex microscopic    Standing Status:   Future    Standing  Expiration Date:   09/06/2018  . CBC with Differential/Platelet    Standing Status:   Future    Standing Expiration Date:   09/06/2018  . Comprehensive metabolic panel    Standing Status:   Future    Standing Expiration Date:   09/06/2018  . Lactate dehydrogenase    Standing Status:   Future    Standing Expiration Date:   09/06/2018  . Urinalysis, Routine w reflex microscopic    Standing Status:   Future    Standing Expiration Date:   09/06/2018       Zoila Shutter MD

## 2017-09-12 NOTE — Telephone Encounter (Signed)
Multiple attempts made to contact patient this encounter will now be closed.  

## 2017-09-13 ENCOUNTER — Ambulatory Visit (INDEPENDENT_AMBULATORY_CARE_PROVIDER_SITE_OTHER): Payer: Medicare Other | Admitting: *Deleted

## 2017-09-13 DIAGNOSIS — I442 Atrioventricular block, complete: Secondary | ICD-10-CM | POA: Diagnosis not present

## 2017-09-13 DIAGNOSIS — I10 Essential (primary) hypertension: Secondary | ICD-10-CM

## 2017-09-14 NOTE — Progress Notes (Signed)
Remote pacemaker transmission.   

## 2017-09-15 ENCOUNTER — Encounter: Payer: Self-pay | Admitting: Cardiology

## 2017-09-18 ENCOUNTER — Other Ambulatory Visit (HOSPITAL_COMMUNITY): Payer: Self-pay | Admitting: *Deleted

## 2017-09-19 ENCOUNTER — Inpatient Hospital Stay (HOSPITAL_COMMUNITY): Payer: Medicare Other

## 2017-09-19 DIAGNOSIS — M2550 Pain in unspecified joint: Secondary | ICD-10-CM | POA: Diagnosis not present

## 2017-09-19 DIAGNOSIS — E890 Postprocedural hypothyroidism: Secondary | ICD-10-CM | POA: Diagnosis not present

## 2017-09-19 DIAGNOSIS — E1165 Type 2 diabetes mellitus with hyperglycemia: Secondary | ICD-10-CM | POA: Diagnosis not present

## 2017-09-19 DIAGNOSIS — I10 Essential (primary) hypertension: Secondary | ICD-10-CM | POA: Diagnosis not present

## 2017-09-19 DIAGNOSIS — G4730 Sleep apnea, unspecified: Secondary | ICD-10-CM | POA: Diagnosis not present

## 2017-09-19 DIAGNOSIS — I359 Nonrheumatic aortic valve disorder, unspecified: Secondary | ICD-10-CM | POA: Diagnosis not present

## 2017-09-19 DIAGNOSIS — E785 Hyperlipidemia, unspecified: Secondary | ICD-10-CM | POA: Diagnosis not present

## 2017-09-19 DIAGNOSIS — I5030 Unspecified diastolic (congestive) heart failure: Secondary | ICD-10-CM | POA: Diagnosis not present

## 2017-09-19 DIAGNOSIS — D696 Thrombocytopenia, unspecified: Secondary | ICD-10-CM | POA: Diagnosis not present

## 2017-09-19 DIAGNOSIS — I700 Atherosclerosis of aorta: Secondary | ICD-10-CM | POA: Diagnosis not present

## 2017-09-19 DIAGNOSIS — K219 Gastro-esophageal reflux disease without esophagitis: Secondary | ICD-10-CM | POA: Diagnosis not present

## 2017-09-19 DIAGNOSIS — Z794 Long term (current) use of insulin: Secondary | ICD-10-CM | POA: Diagnosis not present

## 2017-09-19 DIAGNOSIS — E538 Deficiency of other specified B group vitamins: Secondary | ICD-10-CM | POA: Diagnosis not present

## 2017-09-19 DIAGNOSIS — I4891 Unspecified atrial fibrillation: Secondary | ICD-10-CM | POA: Diagnosis not present

## 2017-09-19 DIAGNOSIS — D649 Anemia, unspecified: Secondary | ICD-10-CM | POA: Diagnosis not present

## 2017-09-19 DIAGNOSIS — Z87891 Personal history of nicotine dependence: Secondary | ICD-10-CM | POA: Diagnosis not present

## 2017-09-19 DIAGNOSIS — N3000 Acute cystitis without hematuria: Secondary | ICD-10-CM | POA: Diagnosis not present

## 2017-09-19 LAB — URINALYSIS, ROUTINE W REFLEX MICROSCOPIC
BACTERIA UA: NONE SEEN
BILIRUBIN URINE: NEGATIVE
Glucose, UA: 50 mg/dL — AB
HGB URINE DIPSTICK: NEGATIVE
KETONES UR: NEGATIVE mg/dL
NITRITE: NEGATIVE
Protein, ur: NEGATIVE mg/dL
Specific Gravity, Urine: 1.012 (ref 1.005–1.030)
pH: 6 (ref 5.0–8.0)

## 2017-09-19 LAB — CBC WITH DIFFERENTIAL/PLATELET
BASOS ABS: 0 10*3/uL (ref 0.0–0.1)
BASOS PCT: 0 %
EOS ABS: 0.3 10*3/uL (ref 0.0–0.7)
Eosinophils Relative: 3 %
HCT: 39 % (ref 39.0–52.0)
Hemoglobin: 13 g/dL (ref 13.0–17.0)
Lymphocytes Relative: 26 %
Lymphs Abs: 2.1 10*3/uL (ref 0.7–4.0)
MCH: 32.3 pg (ref 26.0–34.0)
MCHC: 33.3 g/dL (ref 30.0–36.0)
MCV: 97 fL (ref 78.0–100.0)
Monocytes Absolute: 0.6 10*3/uL (ref 0.1–1.0)
Monocytes Relative: 7 %
NEUTROS ABS: 4.9 10*3/uL (ref 1.7–7.7)
Neutrophils Relative %: 64 %
PLATELETS: 92 10*3/uL — AB (ref 150–400)
RBC: 4.02 MIL/uL — ABNORMAL LOW (ref 4.22–5.81)
RDW: 14.7 % (ref 11.5–15.5)
WBC: 7.9 10*3/uL (ref 4.0–10.5)

## 2017-09-19 LAB — COMPREHENSIVE METABOLIC PANEL
ALBUMIN: 3.9 g/dL (ref 3.5–5.0)
ALT: 27 U/L (ref 0–44)
ANION GAP: 6 (ref 5–15)
AST: 30 U/L (ref 15–41)
Alkaline Phosphatase: 61 U/L (ref 38–126)
BILIRUBIN TOTAL: 0.6 mg/dL (ref 0.3–1.2)
BUN: 19 mg/dL (ref 8–23)
CHLORIDE: 102 mmol/L (ref 98–111)
CO2: 28 mmol/L (ref 22–32)
Calcium: 8.8 mg/dL — ABNORMAL LOW (ref 8.9–10.3)
Creatinine, Ser: 1.32 mg/dL — ABNORMAL HIGH (ref 0.61–1.24)
GFR calc Af Amer: 60 mL/min — ABNORMAL LOW (ref >60)
GFR calc non Af Amer: 51 mL/min — ABNORMAL LOW (ref >60)
GLUCOSE: 230 mg/dL — AB (ref 70–99)
POTASSIUM: 4.8 mmol/L (ref 3.5–5.1)
SODIUM: 136 mmol/L (ref 135–145)
Total Protein: 7.3 g/dL (ref 6.5–8.1)

## 2017-09-19 LAB — LACTATE DEHYDROGENASE: LDH: 145 U/L (ref 98–192)

## 2017-09-21 LAB — URINE CULTURE

## 2017-09-22 ENCOUNTER — Encounter: Payer: Self-pay | Admitting: Family Medicine

## 2017-09-22 ENCOUNTER — Ambulatory Visit (INDEPENDENT_AMBULATORY_CARE_PROVIDER_SITE_OTHER): Payer: Medicare Other | Admitting: Family Medicine

## 2017-09-22 VITALS — BP 148/74 | HR 88 | Temp 97.3°F | Ht 73.0 in | Wt 253.0 lb

## 2017-09-22 DIAGNOSIS — F321 Major depressive disorder, single episode, moderate: Secondary | ICD-10-CM

## 2017-09-22 NOTE — Progress Notes (Signed)
BP (!) 148/74   Pulse 88   Temp (!) 97.3 F (36.3 C) (Oral)   Ht 6\' 1"  (1.854 m)   Wt 253 lb (114.8 kg)   BMI 33.38 kg/m    Subjective:    Patient ID: [name], male    DOB: November 03, 1945, 72 y.o.   MRN: [id number]  HPI: [name] is a 72 y.o. male presenting on 09/22/2017 for Personal Problem (Would not give any detail)   HPI Crying and sadness decreased energy Patient has said over the past 2 months he has been feeling more sad and crying and feeling down.  He has been having issues and thinks some of it may be related to the stress that is been having because of low platelets and having to see the hematologist.  He is also been having frequent UTIs and is been concerned about that as well and how his low platelets may be correlating to his illnesses.  He says is been talking to his family and he just starts crying and he cannot control it like he could before and that sweats having him concerned.  Relevant past medical, surgical, family and social history reviewed and updated as indicated. Interim medical history since our last visit reviewed. Allergies and medications reviewed and updated.  Review of Systems  Constitutional: Positive for fatigue. Negative for chills and fever.  Respiratory: Negative for shortness of breath and wheezing.   Cardiovascular: Negative for chest pain and leg swelling.  Skin: Negative for rash.  Psychiatric/Behavioral: Positive for decreased concentration, dysphoric mood and sleep disturbance. Negative for self-injury and suicidal ideas. The patient is nervous/anxious.   All other systems reviewed and are negative.   Per HPI unless specifically indicated above   Allergies as of 09/22/2017      Reactions   Phenothiazines Anaphylaxis   Actos [pioglitazone] Swelling      Medication List        Accurate as of 09/22/17 10:27 AM. Always use your most recent med list.          acetaminophen 500 MG tablet Commonly known as:   TYLENOL Take 1,000 mg by mouth every 6 (six) hours as needed (pain).   cholecalciferol 1000 units tablet Commonly known as:  VITAMIN D Take 1,000 Units by mouth daily.   Fish Oil 1200 MG Caps Take 1,200-2,400 mg by mouth 2 (two) times daily. Take 1 capsule (1200 mg) by mouth daily at noon and 2 capsules (2400 mg) daily at bedtime   fluticasone 50 MCG/ACT nasal spray Commonly known as:  FLONASE Place 2 sprays into both nostrils daily.   furosemide 20 MG tablet Commonly known as:  LASIX Take 1 tablet (20 mg total) by mouth daily.   glucose blood test strip 1 strip by Does not apply route 3 (three) times daily. Uses true metrix strips (diabetic club)   insulin detemir 100 UNIT/ML injection Commonly known as:  LEVEMIR Inject 0.47-0.48 mLs (47-48 Units total) into the skin 2 (two) times daily.   Insulin Pen Needle 31G X 8 MM Misc Use to inject Victoza qd   levothyroxine 200 MCG tablet Commonly known as:  SYNTHROID, LEVOTHROID Take 1 tablet (200 mcg total) by mouth daily before breakfast.   MENS MULTI VITAMIN & MINERAL PO Take 1 tablet by mouth daily.   metFORMIN 1000 MG tablet Commonly known as:  GLUCOPHAGE TAKE 1 TABLET BY MOUTH TWICE DAILY WITH A MEAL.   OZEMPIC 0.25 or 0.5 MG/DOSE Sopn Generic drug:  Semaglutide Inject into the skin.   simvastatin 80 MG tablet Commonly known as:  ZOCOR Take 0.5 tablets (40 mg total) by mouth at bedtime.   tamsulosin 0.4 MG Caps capsule Commonly known as:  FLOMAX Take 2 capsules (0.8 mg total) by mouth at bedtime.          Objective:    BP (!) 148/74   Pulse 88   Temp (!) 97.3 F (36.3 C) (Oral)   Ht 6\' 1"  (1.854 m)   Wt 253 lb (114.8 kg)   BMI 33.38 kg/m   Wt Readings from Last 3 Encounters:  09/22/17 253 lb (114.8 kg)  09/05/17 257 lb 6.4 oz (116.8 kg)  08/16/17 257 lb 11.2 oz (116.9 kg)    Physical Exam  Constitutional: He is oriented to person, place, and time. He appears well-developed and well-nourished. No  distress.  Eyes: Conjunctivae are normal. No scleral icterus.  Neck: No thyromegaly present.  Cardiovascular: Normal rate, regular rhythm, normal heart sounds and intact distal pulses.  No murmur heard. Pulmonary/Chest: Effort normal and breath sounds normal. No respiratory distress. He has no wheezes.  Neurological: He is alert and oriented to person, place, and time. Coordination normal.  Skin: Skin is warm and dry. No rash noted. He is not diaphoretic.  Psychiatric: His behavior is normal. His mood appears anxious. He exhibits a depressed mood. He expresses no suicidal ideation. He expresses no suicidal plans.  Nursing note and vitals reviewed.       Assessment & Plan:   Problem List Items Addressed This Visit    None    Visit Diagnoses    Depression, major, single episode, moderate ([place])    -  Primary   Patient has been going through some stuff healthwise over the past couple months and he has been crying a lot more, will give a list of counselors and check tsh   Relevant Orders   CBC with Differential/Platelet   TSH      Will check TSH but it sounds like patient has been having some depression, gave a list of counselors and he will consider that versus treatment with medication in the future. Follow up plan: Return if symptoms worsen or fail to improve.  Counseling provided for all of the vaccine components No orders of the defined types were placed in this encounter.   Caryl Pina, MD [facility] 09/22/2017, 10:27 AM

## 2017-09-23 LAB — CBC WITH DIFFERENTIAL/PLATELET
BASOS ABS: 0 10*3/uL (ref 0.0–0.2)
Basos: 1 %
EOS (ABSOLUTE): 0.2 10*3/uL (ref 0.0–0.4)
EOS: 2 %
HEMOGLOBIN: 12.6 g/dL — AB (ref 13.0–17.7)
Hematocrit: 38.2 % (ref 37.5–51.0)
IMMATURE GRANULOCYTES: 0 %
Immature Grans (Abs): 0 10*3/uL (ref 0.0–0.1)
Lymphocytes Absolute: 1.6 10*3/uL (ref 0.7–3.1)
Lymphs: 20 %
MCH: 32 pg (ref 26.6–33.0)
MCHC: 33 g/dL (ref 31.5–35.7)
MCV: 97 fL (ref 79–97)
MONOS ABS: 0.6 10*3/uL (ref 0.1–0.9)
Monocytes: 8 %
NEUTROS PCT: 69 %
Neutrophils Absolute: 5.7 10*3/uL (ref 1.4–7.0)
Platelets: 94 10*3/uL — CL (ref 150–450)
RBC: 3.94 x10E6/uL — AB (ref 4.14–5.80)
RDW: 13.4 % (ref 12.3–15.4)
WBC: 8.2 10*3/uL (ref 3.4–10.8)

## 2017-09-23 LAB — TSH: TSH: 0.156 u[IU]/mL — ABNORMAL LOW (ref 0.450–4.500)

## 2017-09-27 ENCOUNTER — Telehealth: Payer: Self-pay | Admitting: Family Medicine

## 2017-09-27 NOTE — Telephone Encounter (Signed)
Aware. 

## 2017-10-05 DIAGNOSIS — Z029 Encounter for administrative examinations, unspecified: Secondary | ICD-10-CM

## 2017-10-17 LAB — CUP PACEART REMOTE DEVICE CHECK
Battery Remaining Longevity: 121 mo
Battery Voltage: 3.02 V
Brady Statistic AP VP Percent: 4.41 %
Brady Statistic AP VS Percent: 0.01 %
Brady Statistic AS VP Percent: 95.23 %
Brady Statistic AS VS Percent: 0.07 %
Brady Statistic RA Percent Paced: 2.21 %
Brady Statistic RV Percent Paced: 98.97 %
Date Time Interrogation Session: 20190822053608
Implantable Lead Implant Date: 20180521
Implantable Lead Implant Date: 20180521
Implantable Lead Location: 753859
Implantable Lead Location: 753859
Implantable Lead Model: 3830
Implantable Lead Model: 5076
Implantable Pulse Generator Implant Date: 20180521
Lead Channel Impedance Value: 323 Ohm
Lead Channel Impedance Value: 342 Ohm
Lead Channel Impedance Value: 399 Ohm
Lead Channel Impedance Value: 456 Ohm
Lead Channel Pacing Threshold Amplitude: 0.75 V
Lead Channel Pacing Threshold Amplitude: 1 V
Lead Channel Pacing Threshold Pulse Width: 0.4 ms
Lead Channel Pacing Threshold Pulse Width: 0.4 ms
Lead Channel Sensing Intrinsic Amplitude: 1.625 mV
Lead Channel Sensing Intrinsic Amplitude: 1.625 mV
Lead Channel Sensing Intrinsic Amplitude: 18.25 mV
Lead Channel Sensing Intrinsic Amplitude: 18.25 mV
Lead Channel Setting Pacing Amplitude: 1.5 V
Lead Channel Setting Pacing Amplitude: 2.5 V
Lead Channel Setting Pacing Pulse Width: 0.4 ms
Lead Channel Setting Sensing Sensitivity: 2 mV

## 2017-10-18 ENCOUNTER — Encounter: Payer: Self-pay | Admitting: Family Medicine

## 2017-10-18 ENCOUNTER — Other Ambulatory Visit (HOSPITAL_COMMUNITY): Payer: Self-pay | Admitting: Internal Medicine

## 2017-10-18 ENCOUNTER — Ambulatory Visit (INDEPENDENT_AMBULATORY_CARE_PROVIDER_SITE_OTHER): Payer: Medicare Other | Admitting: Family Medicine

## 2017-10-18 ENCOUNTER — Telehealth: Payer: Self-pay | Admitting: Family Medicine

## 2017-10-18 VITALS — BP 128/73 | HR 80 | Temp 97.1°F | Ht 73.0 in | Wt 251.8 lb

## 2017-10-18 DIAGNOSIS — R0981 Nasal congestion: Secondary | ICD-10-CM | POA: Diagnosis not present

## 2017-10-18 DIAGNOSIS — R42 Dizziness and giddiness: Secondary | ICD-10-CM | POA: Diagnosis not present

## 2017-10-18 DIAGNOSIS — R011 Cardiac murmur, unspecified: Secondary | ICD-10-CM

## 2017-10-18 NOTE — Telephone Encounter (Signed)
appt scheduled Pt notified 

## 2017-10-18 NOTE — Progress Notes (Signed)
BP 128/73   Pulse 80   Temp (!) 97.1 F (36.2 C) (Oral)   Ht 6\' 1"  (1.854 m)   Wt 251 lb 12.8 oz (114.2 kg)   BMI 33.22 kg/m    Subjective:    Patient ID: [name], male    DOB: 01-07-1946, 72 y.o.   MRN: [id number]  HPI: [name] is a 72 y.o. male presenting on 10/18/2017 for Dizziness (x 2 days on and off. Bp Monday 100/83 and tuesday 128/63)   HPI Dizziness and swimmy head sensation Patient comes in with dizziness and spinning sensation and swimmy head sensation that is been going on for the past 2 days.  He was also concerned about his blood pressure because it was down a little bit the other day when he checked it when it was 100/83.  Today in the office is 128/73.  He feels like he does have some sinus congestion and pressure and then he has more of off balance swimmy headed feeling when he stands up quickly especially.  He denies any chest pain or shortness of breath or palpitations.  He denies any significant cough or wheezing more than what he normally has.  Relevant past medical, surgical, family and social history reviewed and updated as indicated. Interim medical history since our last visit reviewed. Allergies and medications reviewed and updated.  Review of Systems  Constitutional: Negative for chills and fever.  HENT: Positive for congestion, sinus pressure and sinus pain. Negative for rhinorrhea and sore throat.   Eyes: Negative for visual disturbance.  Respiratory: Positive for cough. Negative for shortness of breath and wheezing.   Cardiovascular: Negative for chest pain and leg swelling.  Musculoskeletal: Negative for arthralgias, back pain and gait problem.  Skin: Negative for rash.  Neurological: Positive for dizziness and light-headedness. Negative for syncope, speech difficulty, weakness and numbness.  All other systems reviewed and are negative.   Per HPI unless specifically indicated above   Allergies as of 10/18/2017      Reactions   Phenothiazines Anaphylaxis   Actos [pioglitazone] Swelling      Medication List        Accurate as of 10/18/17  8:17 AM. Always use your most recent med list.          acetaminophen 500 MG tablet Commonly known as:  TYLENOL Take 1,000 mg by mouth every 6 (six) hours as needed (pain).   cholecalciferol 1000 units tablet Commonly known as:  VITAMIN D Take 1,000 Units by mouth daily.   Fish Oil 1200 MG Caps Take 1,200-2,400 mg by mouth 2 (two) times daily. Take 1 capsule (1200 mg) by mouth daily at noon and 2 capsules (2400 mg) daily at bedtime   fluticasone 50 MCG/ACT nasal spray Commonly known as:  FLONASE Place 2 sprays into both nostrils daily.   furosemide 20 MG tablet Commonly known as:  LASIX Take 1 tablet (20 mg total) by mouth daily.   glucose blood test strip 1 strip by Does not apply route 3 (three) times daily. Uses true metrix strips (diabetic club)   insulin detemir 100 UNIT/ML injection Commonly known as:  LEVEMIR Inject 0.47-0.48 mLs (47-48 Units total) into the skin 2 (two) times daily.   Insulin Pen Needle 31G X 8 MM Misc Use to inject Victoza qd   levothyroxine 200 MCG tablet Commonly known as:  SYNTHROID, LEVOTHROID Take 1 tablet (200 mcg total) by mouth daily before breakfast.   MENS MULTI VITAMIN & MINERAL  PO Take 1 tablet by mouth daily.   metFORMIN 1000 MG tablet Commonly known as:  GLUCOPHAGE TAKE 1 TABLET BY MOUTH TWICE DAILY WITH A MEAL.   OZEMPIC (0.25 OR 0.5 MG/DOSE) 2 MG/1.5ML Sopn Generic drug:  Semaglutide(0.25 or 0.5MG /DOS) Inject into the skin.   simvastatin 80 MG tablet Commonly known as:  ZOCOR Take 0.5 tablets (40 mg total) by mouth at bedtime.   tamsulosin 0.4 MG Caps capsule Commonly known as:  FLOMAX Take 2 capsules (0.8 mg total) by mouth at bedtime.          Objective:    BP 128/73   Pulse 80   Temp (!) 97.1 F (36.2 C) (Oral)   Ht 6\' 1"  (1.854 m)   Wt 251 lb 12.8 oz (114.2 kg)   BMI 33.22 kg/m   Wt  Readings from Last 3 Encounters:  10/18/17 251 lb 12.8 oz (114.2 kg)  09/22/17 253 lb (114.8 kg)  09/05/17 257 lb 6.4 oz (116.8 kg)    Physical Exam  Constitutional: He is oriented to person, place, and time. He appears well-developed and well-nourished. No distress.  Eyes: Conjunctivae are normal. No scleral icterus.  Neck: Neck supple. No thyromegaly present.  Cardiovascular: Normal rate, regular rhythm and intact distal pulses.  Murmur (Grade 2 out of 6 systolic crescendo decrescendo murmur best heard in the second intercostal space) heard. Pulmonary/Chest: Effort normal and breath sounds normal. No respiratory distress. He has no wheezes.  Musculoskeletal: Normal range of motion. He exhibits no edema.  Lymphadenopathy:    He has no cervical adenopathy.  Neurological: He is alert and oriented to person, place, and time. Coordination normal.  Skin: Skin is warm and dry. No rash noted. He is not diaphoretic.  Psychiatric: He has a normal mood and affect. His behavior is normal.  Nursing note and vitals reviewed.   EKG: Widened QRS, paced rhythm, signs of possible bundle branch but also paced rhythm excuse this.  Patient has ST changes that have been there previously and V4 through V6 and lead to and lead III and lead I but are unchanged from previous the ST changes are sloping, patient has a cardiologist to follow-up with.    Assessment & Plan:   Problem List Items Addressed This Visit      Other   MURMUR    Other Visit Diagnoses    Dizziness    -  Primary   Relevant Orders   EKG 12-Lead (Completed)   Sinus congestion       Dizziness is likely from sinuses, recommend continue Flonase and take an allergy pill      Patient has known murmur this been there previous, his EKG appears very similar to his previous EKG without much changes.  It sounds like this is likely from sinus congestion and will try Flonase and an allergy medicine and see if that gets in where he needs to  be. Follow up plan: Return if symptoms worsen or fail to improve.  Counseling provided for all of the vaccine components Orders Placed This Encounter  Procedures  . EKG 12-Lead    Caryl Pina, MD [facility] 10/18/2017, 8:17 AM

## 2017-10-19 ENCOUNTER — Telehealth (HOSPITAL_COMMUNITY): Payer: Self-pay

## 2017-10-19 NOTE — Telephone Encounter (Signed)
Late entry for 10/18/17 at 1100: patient called stating he was having symptoms of a UTI. He would not give me specific symptoms, just that he has had a UTI before and knows what it is. He is requesting Dr. Walden Field send him an antibiotic to his pharmacy like she did last time. I asked patient if he had contacted his PCP or urologist and he stated he had just left his PCP's office. He had a follow up there. He states he does not have a urologist. I explained to the patient that I would have to talk with Dr. Walden Field and get back with him. Reviewed with Dr. Walden Field. She wants patient to come by and give a urine sample for UA and UC. Then once we have the results, she will prescribe an antibiotic as needed. Notified patient of Dr. Walden Field recommendation. He states he cannot come give a urine sample. He wants to know why she will not call in an antibiotic. I explained to patient we need the culture results so Dr. Walden Field will know what antibiotic to prescribe. He states he will call his PCP and get one from him. Explained to patient to call the cancer center if he needed to come in to do the urine sample and we would set up appt. He verbalized understanding.

## 2017-10-20 ENCOUNTER — Encounter: Payer: Self-pay | Admitting: Family Medicine

## 2017-10-20 ENCOUNTER — Ambulatory Visit (INDEPENDENT_AMBULATORY_CARE_PROVIDER_SITE_OTHER): Payer: Medicare Other | Admitting: Family Medicine

## 2017-10-20 ENCOUNTER — Ambulatory Visit: Payer: Medicare Other | Admitting: Family Medicine

## 2017-10-20 VITALS — BP 143/78 | HR 81 | Temp 97.8°F | Ht 73.0 in | Wt 251.8 lb

## 2017-10-20 DIAGNOSIS — N3000 Acute cystitis without hematuria: Secondary | ICD-10-CM | POA: Diagnosis not present

## 2017-10-20 DIAGNOSIS — R300 Dysuria: Secondary | ICD-10-CM | POA: Diagnosis not present

## 2017-10-20 LAB — URINALYSIS, COMPLETE
Bilirubin, UA: NEGATIVE
Glucose, UA: NEGATIVE
KETONES UA: NEGATIVE
Nitrite, UA: NEGATIVE
Protein, UA: NEGATIVE
RBC, UA: NEGATIVE
SPEC GRAV UA: 1.015 (ref 1.005–1.030)
Urobilinogen, Ur: 0.2 mg/dL (ref 0.2–1.0)
pH, UA: 6 (ref 5.0–7.5)

## 2017-10-20 LAB — MICROSCOPIC EXAMINATION: RBC, UA: NONE SEEN /hpf (ref 0–2)

## 2017-10-20 MED ORDER — SULFAMETHOXAZOLE-TRIMETHOPRIM 800-160 MG PO TABS: 1.0000 | ORAL_TABLET | Freq: Two times a day (BID) | ORAL | 0 refills | Status: DC

## 2017-10-20 NOTE — Progress Notes (Signed)
BP (!) 143/78   Pulse 81   Temp 97.8 F (36.6 C) (Oral)   Ht 6\' 1"  (1.854 m)   Wt 251 lb 12.8 oz (114.2 kg)   BMI 33.22 kg/m    Subjective:    Patient ID: [name], male    DOB: [birth year], 72 y.o.   MRN: [id number]  HPI: [name] is a 72 y.o. male presenting on 10/20/2017 for Dysuria (x 2 days)   HPI dysuria and frequency Patient is coming in with urinary frequency and dysuria and is been going on for 2 days.  He was seen a couple days ago but he did not have the symptoms then but they have developed after this.  He says he has some lower abdominal pain and some flank pain on both sides and burning with urination and frequency and urgency.  He still has some dizziness associated with it and is worse in the little bit when this started up.  He denies any fevers but does have chills and some body aches.  Relevant past medical, surgical, family and social history reviewed and updated as indicated. Interim medical history since our last visit reviewed. Allergies and medications reviewed and updated.  Review of Systems  Constitutional: Negative for chills and fever.  Respiratory: Negative for shortness of breath and wheezing.   Cardiovascular: Negative for chest pain and leg swelling.  Gastrointestinal: Positive for abdominal pain.  Genitourinary: Positive for dysuria, flank pain, frequency and urgency. Negative for decreased urine volume, difficulty urinating and hematuria.  Musculoskeletal: Negative for back pain and gait problem.  Skin: Negative for rash.  All other systems reviewed and are negative.   Per HPI unless specifically indicated above   Allergies as of 10/20/2017      Reactions   Phenothiazines Anaphylaxis   Actos [pioglitazone] Swelling      Medication List        Accurate as of 10/20/17  8:15 AM. Always use your most recent med list.          acetaminophen 500 MG tablet Commonly known as:  TYLENOL Take 1,000 mg by mouth every 6 (six)  hours as needed (pain).   cholecalciferol 1000 units tablet Commonly known as:  VITAMIN D Take 1,000 Units by mouth daily.   Fish Oil 1200 MG Caps Take 1,200-2,400 mg by mouth 2 (two) times daily. Take 1 capsule (1200 mg) by mouth daily at noon and 2 capsules (2400 mg) daily at bedtime   fluticasone 50 MCG/ACT nasal spray Commonly known as:  FLONASE Place 2 sprays into both nostrils daily.   furosemide 20 MG tablet Commonly known as:  LASIX Take 1 tablet (20 mg total) by mouth daily.   glucose blood test strip 1 strip by Does not apply route 3 (three) times daily. Uses true metrix strips (diabetic club)   insulin detemir 100 UNIT/ML injection Commonly known as:  LEVEMIR Inject 0.47-0.48 mLs (47-48 Units total) into the skin 2 (two) times daily.   Insulin Pen Needle 31G X 8 MM Misc Use to inject Victoza qd   levothyroxine 200 MCG tablet Commonly known as:  SYNTHROID, LEVOTHROID Take 1 tablet (200 mcg total) by mouth daily before breakfast.   MENS MULTI VITAMIN & MINERAL PO Take 1 tablet by mouth daily.   metFORMIN 1000 MG tablet Commonly known as:  GLUCOPHAGE TAKE 1 TABLET BY MOUTH TWICE DAILY WITH A MEAL.   OZEMPIC (0.25 OR 0.5 MG/DOSE) 2 MG/1.5ML Sopn Generic drug:  Semaglutide(0.25  or 0.5MG /DOS) Inject into the skin.   simvastatin 80 MG tablet Commonly known as:  ZOCOR Take 0.5 tablets (40 mg total) by mouth at bedtime.   tamsulosin 0.4 MG Caps capsule Commonly known as:  FLOMAX Take 2 capsules (0.8 mg total) by mouth at bedtime.          Objective:    BP (!) 143/78   Pulse 81   Temp 97.8 F (36.6 C) (Oral)   Ht 6\' 1"  (1.854 m)   Wt 251 lb 12.8 oz (114.2 kg)   BMI 33.22 kg/m   Wt Readings from Last 3 Encounters:  10/20/17 251 lb 12.8 oz (114.2 kg)  10/18/17 251 lb 12.8 oz (114.2 kg)  09/22/17 253 lb (114.8 kg)    Physical Exam  Constitutional: He is oriented to person, place, and time. He appears well-developed and well-nourished. No distress.   Eyes: Conjunctivae are normal. No scleral icterus.  Cardiovascular: Normal rate, regular rhythm, normal heart sounds and intact distal pulses.  No murmur heard. Pulmonary/Chest: Effort normal and breath sounds normal. No respiratory distress. He has no wheezes.  Abdominal: Soft. Bowel sounds are normal. He exhibits no distension and no mass. There is tenderness (mild llq abd tenderness). There is no guarding.  Musculoskeletal: Normal range of motion. He exhibits no edema.  Neurological: He is alert and oriented to person, place, and time. Coordination normal.  Skin: Skin is warm and dry. No rash noted. He is not diaphoretic.  Psychiatric: He has a normal mood and affect. His behavior is normal.  Nursing note and vitals reviewed.   Urinalysis: 6-10 WBCs, no RBCs, 0-10 epithelial cells, few bacteria, 1+ leukocytes    Assessment & Plan:   Problem List Items Addressed This Visit    None    Visit Diagnoses    Acute cystitis without hematuria    -  Primary   Relevant Medications   sulfamethoxazole-trimethoprim (BACTRIM DS) 800-160 MG tablet   Other Relevant Orders   Urinalysis, Complete       Follow up plan: Return if symptoms worsen or fail to improve.  Counseling provided for all of the vaccine components Orders Placed This Encounter  Procedures  . Urinalysis, Complete    Caryl Pina, MD [facility] 10/20/2017, 8:15 AM

## 2017-10-21 LAB — URINE CULTURE

## 2017-10-27 NOTE — Telephone Encounter (Signed)
Per Dr. Walden Field' nurse last note, refill not appropriate unless patient gives urine sample.

## 2017-10-30 ENCOUNTER — Inpatient Hospital Stay (HOSPITAL_COMMUNITY): Payer: Medicare Other | Attending: Hematology

## 2017-10-30 ENCOUNTER — Other Ambulatory Visit: Payer: Self-pay | Admitting: Family Medicine

## 2017-10-30 DIAGNOSIS — D696 Thrombocytopenia, unspecified: Secondary | ICD-10-CM

## 2017-10-30 DIAGNOSIS — Z87440 Personal history of urinary (tract) infections: Secondary | ICD-10-CM | POA: Insufficient documentation

## 2017-10-30 DIAGNOSIS — M25559 Pain in unspecified hip: Secondary | ICD-10-CM | POA: Insufficient documentation

## 2017-10-30 DIAGNOSIS — I110 Hypertensive heart disease with heart failure: Secondary | ICD-10-CM | POA: Diagnosis not present

## 2017-10-30 DIAGNOSIS — N3000 Acute cystitis without hematuria: Secondary | ICD-10-CM

## 2017-10-30 DIAGNOSIS — Z87891 Personal history of nicotine dependence: Secondary | ICD-10-CM | POA: Insufficient documentation

## 2017-10-30 LAB — URINALYSIS, ROUTINE W REFLEX MICROSCOPIC
Bilirubin Urine: NEGATIVE
GLUCOSE, UA: NEGATIVE mg/dL
HGB URINE DIPSTICK: NEGATIVE
Ketones, ur: NEGATIVE mg/dL
NITRITE: NEGATIVE
PH: 6 (ref 5.0–8.0)
Protein, ur: NEGATIVE mg/dL
Specific Gravity, Urine: 1.008 (ref 1.005–1.030)

## 2017-10-30 LAB — CBC WITH DIFFERENTIAL/PLATELET
Basophils Absolute: 0 10*3/uL (ref 0.0–0.1)
Basophils Relative: 0 %
Eosinophils Absolute: 0.2 10*3/uL (ref 0.0–0.7)
Eosinophils Relative: 3 %
HCT: 39.9 % (ref 39.0–52.0)
Hemoglobin: 13.3 g/dL (ref 13.0–17.0)
LYMPHS ABS: 1.7 10*3/uL (ref 0.7–4.0)
LYMPHS PCT: 25 %
MCH: 32.6 pg (ref 26.0–34.0)
MCHC: 33.3 g/dL (ref 30.0–36.0)
MCV: 97.8 fL (ref 78.0–100.0)
MONO ABS: 0.5 10*3/uL (ref 0.1–1.0)
MONOS PCT: 8 %
Neutro Abs: 4.3 10*3/uL (ref 1.7–7.7)
Neutrophils Relative %: 64 %
PLATELETS: 94 10*3/uL — AB (ref 150–400)
RBC: 4.08 MIL/uL — AB (ref 4.22–5.81)
RDW: 15.1 % (ref 11.5–15.5)
WBC: 6.7 10*3/uL (ref 4.0–10.5)

## 2017-10-30 LAB — COMPREHENSIVE METABOLIC PANEL
ALK PHOS: 51 U/L (ref 38–126)
ALT: 18 U/L (ref 0–44)
ANION GAP: 7 (ref 5–15)
AST: 23 U/L (ref 15–41)
Albumin: 3.9 g/dL (ref 3.5–5.0)
BUN: 18 mg/dL (ref 8–23)
CALCIUM: 8.9 mg/dL (ref 8.9–10.3)
CO2: 26 mmol/L (ref 22–32)
CREATININE: 1.36 mg/dL — AB (ref 0.61–1.24)
Chloride: 102 mmol/L (ref 98–111)
GFR, EST AFRICAN AMERICAN: 58 mL/min — AB (ref >60)
GFR, EST NON AFRICAN AMERICAN: 50 mL/min — AB (ref >60)
Glucose, Bld: 316 mg/dL — ABNORMAL HIGH (ref 70–99)
Potassium: 4.7 mmol/L (ref 3.5–5.1)
Sodium: 135 mmol/L (ref 135–145)
Total Bilirubin: 0.5 mg/dL (ref 0.3–1.2)
Total Protein: 7.3 g/dL (ref 6.5–8.1)

## 2017-10-30 LAB — LACTATE DEHYDROGENASE: LDH: 146 U/L (ref 98–192)

## 2017-10-31 ENCOUNTER — Other Ambulatory Visit: Payer: Self-pay

## 2017-10-31 ENCOUNTER — Encounter (HOSPITAL_COMMUNITY): Payer: Self-pay | Admitting: Internal Medicine

## 2017-10-31 ENCOUNTER — Inpatient Hospital Stay (HOSPITAL_BASED_OUTPATIENT_CLINIC_OR_DEPARTMENT_OTHER): Payer: Medicare Other | Admitting: Internal Medicine

## 2017-10-31 VITALS — BP 145/70 | HR 86 | Temp 98.0°F | Resp 20 | Wt 245.0 lb

## 2017-10-31 DIAGNOSIS — Z87891 Personal history of nicotine dependence: Secondary | ICD-10-CM

## 2017-10-31 DIAGNOSIS — D696 Thrombocytopenia, unspecified: Secondary | ICD-10-CM

## 2017-10-31 DIAGNOSIS — M25559 Pain in unspecified hip: Secondary | ICD-10-CM

## 2017-10-31 DIAGNOSIS — I10 Essential (primary) hypertension: Secondary | ICD-10-CM | POA: Diagnosis not present

## 2017-10-31 DIAGNOSIS — Z87440 Personal history of urinary (tract) infections: Secondary | ICD-10-CM

## 2017-10-31 DIAGNOSIS — I110 Hypertensive heart disease with heart failure: Secondary | ICD-10-CM | POA: Diagnosis not present

## 2017-10-31 NOTE — Progress Notes (Signed)
Diagnosis Thrombocytopenia ([name]) - Plan: CBC with Differential/Platelet, Comprehensive metabolic panel, Lactate dehydrogenase  Staging Cancer Staging No matching staging information was found for the patient.  Assessment and Plan:  1.  Thrombocytopenia.  Labs performed  08/16/2017 showed a white count of 7.9 hemoglobin 12.3 platelets 78,000.  Peripheral smear shows no fragmentation.  He has a normal differential.  Path review shows thrombocytopenia.  Chemistries normal with a creatinine of 1.17.  LDH was 133 liver function tests were normal.  The patient has previously undergone HIV and hepatitis C testing that was negative.  He has normal PT, PTT, hepatitis A and B serologies.    CT of the chest, abdomen and pelvis done 08/30/2017 showed IMPRESSION: Chest Impression:  1. Post CABG. 2. No acute pulmonary findings.  Abdomen / Pelvis Impression:  1. Normal spleen.  No lymphadenopathy. 2. Normal kidneys. 3.  Aortic Atherosclerosis (ICD10-I70.0).  I have discussed with him scans are normal.  Review of record shows he had thrombocytopenia dating back to 04/2017.  At that time he was diagnosed with Serratia UTI.  He denies being treated for UTI.  I have discussed with him infection could play a role in thrombocytopenia.  He will be placed on Bactrim DS 1 tab bid for 10 days.    Labs done 10/30/2017 reviewed and showed WBC 6.7 HB 13.3 plts 94,000.  Plt count is improved from labs done in 07/2017.  Chemistries WNL with K+ 4.7 and Cr 1.36.  I discussed with him he will continue to have labs monitored and if counts drop less than 50,000 he will be set up for bone Marrow aspirate and biopsy for further evaluation. Pt will RTC in 01/2018 for follow-up and labs.    2.  UTI.  Review of record shows he had thrombocytopenia dating back to 04/2017.  At that time he was diagnosed with Serratia UTI.  Pt has completed bactrim.  UA and culture done 09/2017 showed mixed flora.  He should continue to follow-up  with PCP if ongoing GU symptoms.    3.  Joint pain.  He reports this is primarily in his hip.  SPEP, C-reactive protein, sed rate, rheumatoid factor  were negative.  Follow-up with PCP for ongoing management.  4.  Smoking/SOB.  Patient had a prior history of smoking.  CT of the chest done 08/30/2017 was negative.     5.  Hypertension.  Blood pressure is 145/70.  Follow-up with PCP.  Interval history:  Historical data obtained from the note dated 08/16/2017.  73 year old male who was referred for evaluation of thrombocytopenia.  He denies any bruising or bleeding.  He has never been told he had problems with his liver.  He is a former smoker.  He reports he has problems with persistent UTIs.  He has also lost 12 pounds.  Review of chart shows patient had labs done July 11, 2017 that showed a white count 7.6 hemoglobin 13.1 platelets were 94,000.  He had a normal differential.  He had repeat labs done on 17 2019 that showed a white count of 7 hemoglobin 12.6 platelets 77,000.  He had a normal differential.  Chemistries showed a creatinine of 1.18, liver function tests were normal.  He denies any fevers, chills, night sweats, and has noted no adenopathy.  He has problems with hip pain which he reports he has been told is due to arthritis.  He denies any family history of leukemias or lymphomas.    Current Status:  Pt is here  today for follow-up.  He is here to go over labs.     Problem List Patient Active Problem List   Diagnosis Date Noted  . S/P AVR (aortic valve replacement) [Z95.2] 11/04/2016  . Atrial fibrillation ([place]) [I48.91] 09/27/2016  . Symptomatic bradycardia [R00.1] 06/12/2016  . Urgency incontinence [N39.41] 11/10/2015  . Groin pain [R10.30]   . Hypothyroidism [E03.9] 03/25/2015  . 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 Diastolic CHF ([place]) [Q33.00] 01/01/2015  . B12 deficiency [E53.8] 04/08/2014  . Lateral meniscal tear [S83.289A]   . DM type 2, not at goal [facility]) [E11.9]  03/12/2014  . 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 GERD (gastroesophageal reflux disease) [K21.9] 11/02/2012  . Anemia [D64.9] 04/02/2012  . Obesity [E66.9] 08/04/2010  . Benign prostatic hyperplasia [N40.0] 04/15/2010  . CAROTID OCCLUSIVE DISEASE [I65.29] 01/26/2010  . MURMUR [R01.1] 05/27/2009  . Aortic valve disease [I35.9] 05/27/2009  . Hyperlipidemia with target LDL less than 100 [E78.5] 01/22/2009  . Depression [F32.9] 01/22/2009  . Essential hypertension [I10] 01/22/2009    Past Medical History Past Medical History:  Diagnosis Date  . Arthritis   . BPH (benign prostatic hypertrophy) 04/15/2010  . Cataract   . Colon polyps   . DEPRESSION 01/22/2009  . Heart valve replaced   . HYPERCHOLESTEROLEMIA 01/22/2009  . HYPERTENSION 01/22/2009   Dr. [name]  . 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 Morbid obesity ([place])   . Tremors of nervous system    ?ptsd  . Ulcer     Past Surgical History Past Surgical History:  Procedure Laterality Date  . 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 APPENDECTOMY    . bilateral cataract surg    . COLONOSCOPY  04/24/2007   [name]: normal  . COLONOSCOPY WITH ESOPHAGOGASTRODUODENOSCOPY (EGD) N/A 04/05/2012   Procedure: COLONOSCOPY WITH ESOPHAGOGASTRODUODENOSCOPY (EGD);  Surgeon: Daneil Dolin, MD;  Location: [facility];  Service: Endoscopy;  Laterality: N/A;  10;15  . DENTAL SURGERY  05/2004   Dental extractions  . EYE SURGERY Bilateral    cataract  . HEEL SPUR SURGERY Bilateral    resection of heel spur  . KNEE ARTHROSCOPY WITH LATERAL MENISECTOMY Right 04/04/2014   Procedure: KNEE ARTHROSCOPY WITH LATERAL MENISECTOMY;  Surgeon: Carole Civil, MD;  Location: [facility] OR[facility];  Service: Orthopedics;  Laterality: Right;  . PACEMAKER IMPLANT N/A 06/13/2016   Procedure: Pacemaker Implant- Dual Chamber;  Surgeon: Evans Lance, MD;  Location: [place] [facility];   Service: Cardiovascular;  Laterality: N/A;  . PACEMAKER INSERTION  2018  . THYROIDECTOMY  03/21/2013   DR [name]  . THYROIDECTOMY N/A 03/21/2013   Procedure: THYROIDECTOMY;  Surgeon: [name], MD;  Location: [place];  Service: General;  Laterality: N/A;  . TONSILLECTOMY      Family History Family History  Problem Relation Age of Onset  . Heart disease Father   . Congestive Heart Failure Father   . Arthritis Father   . Diabetes Unknown   . Benign prostatic hyperplasia Brother      Social History  reports that he quit smoking about 56 years ago. His smoking use included cigarettes. He smoked 0.00 packs per day for 12.00 years. He has never used smokeless tobacco. He reports that he does not drink alcohol or use drugs.  Medications  Current Outpatient Medications:  .  acetaminophen (TYLENOL) 500 MG tablet, Take 1,000 mg by mouth every 6 (six) hours as needed (pain)., Disp: ,  Rfl:  .  cholecalciferol (VITAMIN D) 1000 units tablet, Take 1,000 Units by mouth daily. , Disp: , Rfl:  .  fluticasone (FLONASE) 50 MCG/ACT nasal spray, Place 2 sprays into both nostrils daily., Disp: 16 g, Rfl: 6 .  furosemide (LASIX) 20 MG tablet, Take 1 tablet (20 mg total) by mouth daily., Disp: 30 tablet, Rfl: 1 .  glucose blood test strip, 1 strip by Does not apply route 3 (three) times daily. Uses true metrix strips (diabetic club), Disp: , Rfl: 12 .  insulin detemir (LEVEMIR) 100 UNIT/ML injection, Inject 0.47-0.48 mLs (47-48 Units total) into the skin 2 (two) times daily. (Patient taking differently: Inject 42 Units into the skin 2 (two) times daily. ), Disp: 10 mL, Rfl:  .  Insulin Pen Needle (B-D ULTRAFINE III SHORT PEN) 31G X 8 MM MISC, Use to inject Victoza qd, Disp: 100 each, Rfl: 5 .  levothyroxine (SYNTHROID, LEVOTHROID) 200 MCG tablet, Take 1 tablet (200 mcg total) by mouth daily before breakfast., Disp: 30 tablet, Rfl: 1 .  metFORMIN (GLUCOPHAGE) 1000 MG tablet, TAKE 1 TABLET BY MOUTH TWICE  DAILY WITH A MEAL., Disp: 180 tablet, Rfl: 0 .  Multiple Vitamins-Minerals (MENS MULTI VITAMIN & MINERAL PO), Take 1 tablet by mouth daily.  , Disp: , Rfl:  .  Omega-3 Fatty Acids (FISH OIL) 1200 MG CAPS, Take 1,200-2,400 mg by mouth 2 (two) times daily. Take 1 capsule (1200 mg) by mouth daily at noon and 2 capsules (2400 mg) daily at bedtime, Disp: , Rfl:  .  Semaglutide (OZEMPIC) 0.25 or 0.5 MG/DOSE SOPN, Inject into the skin., Disp: , Rfl:  .  simvastatin (ZOCOR) 80 MG tablet, Take 0.5 tablets (40 mg total) by mouth at bedtime., Disp: 15 tablet, Rfl: 0 .  tamsulosin (FLOMAX) 0.4 MG CAPS capsule, TAKE TWO CAPSULES BY MOUTH AT BEDTIME, Disp: 180 capsule, Rfl: 1  Allergies Phenothiazines and Actos [pioglitazone]  Review of Systems Review of Systems - Oncology ROS negative   Physical Exam  Vitals Wt Readings from Last 3 Encounters:  10/31/17 245 lb (111.1 kg)  10/20/17 251 lb 12.8 oz (114.2 kg)  10/18/17 251 lb 12.8 oz (114.2 kg)   Temp Readings from Last 3 Encounters:  10/31/17 98 F (36.7 C) (Oral)  10/20/17 97.8 F (36.6 C) (Oral)  10/18/17 (!) 97.1 F (36.2 C) (Oral)   BP Readings from Last 3 Encounters:  10/31/17 (!) 145/70  10/20/17 (!) 143/78  10/18/17 128/73   Pulse Readings from Last 3 Encounters:  10/31/17 86  10/20/17 81  10/18/17 80   Constitutional: Well-developed, well-nourished, and in no distress.  Pt seated in wheelchair HENT: Head: Normocephalic and atraumatic.  Mouth/Throat: No oropharyngeal exudate. Mucosa moist. Eyes: Pupils are equal, round, and reactive to light. Conjunctivae are normal. No scleral icterus.  Neck: Normal range of motion. Neck supple. No JVD present.  Cardiovascular: Normal rate, regular rhythm and normal heart sounds.  Exam reveals no gallop and no friction rub.   No murmur heard. Pulmonary/Chest: Effort normal and breath sounds normal. No respiratory distress. No wheezes.No rales.  Abdominal: Soft. Bowel sounds are normal. No  distension. There is no tenderness. There is no guarding.  Musculoskeletal: No edema or tenderness.  Lymphadenopathy: No cervical, axillary or supraclavicular adenopathy.  Neurological: Alert and oriented to person, place, and time. No cranial nerve deficit.  Skin: Skin is warm and dry. No rash noted. No erythema. No pallor.  Psychiatric: Affect and judgment normal.   Labs Appointment on  10/30/2017  Component Date Value Ref Range Status  . WBC 10/30/2017 6.7  4.0 - 10.5 K/uL Final  . RBC 10/30/2017 4.08* 4.22 - 5.81 MIL/uL Final  . Hemoglobin 10/30/2017 13.3  13.0 - 17.0 g/dL Final  . HCT 10/30/2017 39.9  39.0 - 52.0 % Final  . MCV 10/30/2017 97.8  78.0 - 100.0 fL Final  . MCH 10/30/2017 32.6  26.0 - 34.0 pg Final  . MCHC 10/30/2017 33.3  30.0 - 36.0 g/dL Final  . RDW 10/30/2017 15.1  11.5 - 15.5 % Final  . Platelets 10/30/2017 94* 150 - 400 K/uL Final   Comment: CONSISTENT WITH PREVIOUS RESULT SPECIMEN CHECKED FOR CLOTS   . Neutrophils Relative % 10/30/2017 64  % Final  . Neutro Abs 10/30/2017 4.3  1.7 - 7.7 K/uL Final  . Lymphocytes Relative 10/30/2017 25  % Final  . Lymphs Abs 10/30/2017 1.7  0.7 - 4.0 K/uL Final  . Monocytes Relative 10/30/2017 8  % Final  . Monocytes Absolute 10/30/2017 0.5  0.1 - 1.0 K/uL Final  . Eosinophils Relative 10/30/2017 3  % Final  . Eosinophils Absolute 10/30/2017 0.2  0.0 - 0.7 K/uL Final  . Basophils Relative 10/30/2017 0  % Final  . Basophils Absolute 10/30/2017 0.0  0.0 - 0.1 K/uL Final   Performed at [facility], [address], [place], [place] [address]  . Sodium 10/30/2017 135  135 - 145 mmol/L Final  . Potassium 10/30/2017 4.7  3.5 - 5.1 mmol/L Final  . Chloride 10/30/2017 102  98 - 111 mmol/L Final  . CO2 10/30/2017 26  22 - 32 mmol/L Final  . Glucose, Bld 10/30/2017 316* 70 - 99 mg/dL Final  . BUN 10/30/2017 18  8 - 23 mg/dL Final  . Creatinine, Ser 10/30/2017 1.36* 0.61 - 1.24 mg/dL Final  . Calcium 10/30/2017 8.9  8.9 - 10.3 mg/dL  Final  . Total Protein 10/30/2017 7.3  6.5 - 8.1 g/dL Final  . Albumin 10/30/2017 3.9  3.5 - 5.0 g/dL Final  . AST 10/30/2017 23  15 - 41 U/L Final  . ALT 10/30/2017 18  0 - 44 U/L Final  . Alkaline Phosphatase 10/30/2017 51  38 - 126 U/L Final  . Total Bilirubin 10/30/2017 0.5  0.3 - 1.2 mg/dL Final  . GFR calc non Af Amer 10/30/2017 50* >60 mL/min Final  . GFR calc Af Amer 10/30/2017 58* >60 mL/min Final   Comment: (NOTE) The eGFR has been calculated using the CKD EPI equation. This calculation has not been validated in all clinical situations. eGFR's persistently <60 mL/min signify possible Chronic Kidney Disease.   Georgiann Hahn gap 10/30/2017 7  5 - 15 Final   Performed at [facility], [address], [place], [place] [address]  . LDH 10/30/2017 146  98 - 192 U/L Final   Performed at [facility], [address], [place],  [address]  . Color, Urine 10/30/2017 YELLOW  YELLOW Final  . APPearance 10/30/2017 HAZY* CLEAR Final  . Specific Gravity, Urine 10/30/2017 1.008  1.005 - 1.030 Final  . pH 10/30/2017 6.0  5.0 - 8.0 Final  . Glucose, UA 10/30/2017 NEGATIVE  NEGATIVE mg/dL Final  . Hgb urine dipstick 10/30/2017 NEGATIVE  NEGATIVE Final  . Bilirubin Urine 10/30/2017 NEGATIVE  NEGATIVE Final  . Ketones, ur 10/30/2017 NEGATIVE  NEGATIVE mg/dL Final  . Protein, ur 10/30/2017 NEGATIVE  NEGATIVE mg/dL Final  . Nitrite 10/30/2017 NEGATIVE  NEGATIVE Final  . Leukocytes, UA 10/30/2017 LARGE*  NEGATIVE Final  . RBC / HPF 10/30/2017 0-5  0 - 5 RBC/hpf Final  . WBC, UA 10/30/2017 11-20  0 - 5 WBC/hpf Final  . Bacteria, UA 10/30/2017 RARE* NONE SEEN Final  . Squamous Epithelial / LPF 10/30/2017 0-5  0 - 5 Final  . Mucus 10/30/2017 PRESENT   Final   Performed at [facility], [address], [place], [place] [address]     Pathology Orders Placed This Encounter  Procedures  . CBC with Differential/Platelet    Standing Status:   Future    Standing Expiration Date:   11/01/2019  .  Comprehensive metabolic panel    Standing Status:   Future    Standing Expiration Date:   11/01/2019  . Lactate dehydrogenase    Standing Status:   Future    Standing Expiration Date:   11/01/2019       Zoila Shutter MD

## 2017-11-07 ENCOUNTER — Telehealth: Payer: Self-pay | Admitting: Family Medicine

## 2017-11-07 MED ORDER — AMOXICILLIN 500 MG PO CAPS: 500.0000 mg | ORAL_CAPSULE | Freq: Two times a day (BID) | ORAL | 0 refills | Status: DC

## 2017-11-07 NOTE — Telephone Encounter (Signed)
I sent in a short course of amoxicillin for the patient

## 2017-11-07 NOTE — Telephone Encounter (Signed)
Patient aware.

## 2017-11-14 ENCOUNTER — Ambulatory Visit (INDEPENDENT_AMBULATORY_CARE_PROVIDER_SITE_OTHER): Payer: Medicare Other | Admitting: Family

## 2017-11-14 ENCOUNTER — Encounter: Payer: Self-pay | Admitting: Family

## 2017-11-14 VITALS — BP 124/68 | HR 77 | Temp 96.9°F | Ht 73.0 in | Wt 252.8 lb

## 2017-11-14 DIAGNOSIS — J069 Acute upper respiratory infection, unspecified: Secondary | ICD-10-CM | POA: Diagnosis not present

## 2017-11-14 DIAGNOSIS — R81 Glycosuria: Secondary | ICD-10-CM

## 2017-11-14 DIAGNOSIS — R350 Frequency of micturition: Secondary | ICD-10-CM | POA: Diagnosis not present

## 2017-11-14 DIAGNOSIS — E119 Type 2 diabetes mellitus without complications: Secondary | ICD-10-CM

## 2017-11-14 LAB — URINALYSIS, COMPLETE
Bilirubin, UA: NEGATIVE
Ketones, UA: NEGATIVE
Nitrite, UA: NEGATIVE
PH UA: 5 (ref 5.0–7.5)
Protein, UA: NEGATIVE
RBC, UA: NEGATIVE
Specific Gravity, UA: <1.005 — ABNORMAL LOW (ref 1.005–1.030)
UUROB: 0.2 mg/dL (ref 0.2–1.0)

## 2017-11-14 LAB — MICROSCOPIC EXAMINATION
Bacteria, UA: NONE SEEN
RBC, UA: NONE SEEN /hpf (ref 0–2)
RENAL EPITHEL UA: NONE SEEN /HPF

## 2017-11-14 LAB — GLUCOSE HEMOCUE WAIVED: GLU HEMOCUE WAIVED: 160 mg/dL — AB (ref 65–99)

## 2017-11-14 MED ORDER — FLUTICASONE PROPIONATE 50 MCG/ACT NA SUSP: 2.0000 | Freq: Every day | NASAL | 6 refills | Status: DC

## 2017-11-14 NOTE — Progress Notes (Signed)
Subjective:    Patient ID: [name], male    DOB: 1945-07-16, 72 y.o.   MRN: [id number]  Chief Complaint  Patient presents with  . Chills   Pt presents to the office today with chills that started two days ago. He states he noticed hoarseness about 2-3 days ago. He has also noticed some urinary frequency.  Urinary Frequency   This is a new problem. The current episode started in the past 7 days. The quality of the pain is described as burning. The pain is at a severity of 3/10. The pain is mild. There has been no fever. Associated symptoms include frequency and urgency. Pertinent negatives include no flank pain, hematuria, nausea or vomiting. He has tried increased fluids for the symptoms. The treatment provided mild relief.  Diabetes  He presents for his follow-up diabetic visit. He has type 2 diabetes mellitus. Associated symptoms include visual change. Pertinent negatives for diabetes include no blurred vision. Symptoms are improving. Risk factors for coronary artery disease include diabetes mellitus, dyslipidemia, male sex and hypertension. He is following a generally unhealthy diet. His breakfast blood glucose range is generally 110-130 mg/dl.      Review of Systems  Eyes: Negative for blurred vision.  Gastrointestinal: Negative for nausea and vomiting.  Genitourinary: Positive for frequency and urgency. Negative for flank pain and hematuria.  All other systems reviewed and are negative.      Objective:   Physical Exam  Constitutional: He is oriented to person, place, and time. He appears well-developed and well-nourished. No distress.  HENT:  Head: Normocephalic.  Right Ear: External ear normal.  Mouth/Throat: Posterior oropharyngeal erythema present.  Eyes: Pupils are equal, round, and reactive to light. Right eye exhibits no discharge. Left eye exhibits no discharge.  Neck: Normal range of motion. Neck supple. No thyromegaly present.  Cardiovascular: Normal rate,  regular rhythm, normal heart sounds and intact distal pulses.  No murmur heard. Pulmonary/Chest: Effort normal and breath sounds normal. No respiratory distress. He has no wheezes.  Abdominal: Soft. Bowel sounds are normal. He exhibits no distension. There is no tenderness.  Musculoskeletal: Normal range of motion. He exhibits no edema or tenderness.  Neurological: He is alert and oriented to person, place, and time. He has normal reflexes. No cranial nerve deficit.  Skin: Skin is warm and dry. No rash noted. No erythema. There is pallor.  Psychiatric: He has a normal mood and affect. His behavior is normal. Judgment and thought content normal.  Vitals reviewed.     BP 124/68   Pulse 77   Temp (!) 96.9 F (36.1 C) (Oral)   Ht '6\' 1"'  (1.854 m)   Wt 252 lb 12.8 oz (114.7 kg)   BMI 33.35 kg/m      Assessment & Plan:  KHALID LACKO comes in today with chief complaint of Chills   Diagnosis and orders addressed:  1. DM type 2, not at goal [facility]) Continue medications Low carb diet Keep follow up with PCP - Glucose Hemocue Waived - CMP14+EGFR  2. Urinary frequency No UTI on urinalysis, glucose present.  - Urinalysis, Complete - CMP14+EGFR  3. Viral upper respiratory tract infection - Take meds as prescribed - Use a cool mist humidifier  -Use saline nose sprays frequently -Force fluids -For any cough or congestion  Use plain Mucinex- regular strength or max strength is fine -For fever or aces or pains- take tylenol or ibuprofen. -Throat lozenges if help -RTO if symptoms worsen or do  not improve - CMP14+EGFR - fluticasone (FLONASE) 50 MCG/ACT nasal spray; Place 2 sprays into both nostrils daily.  Dispense: 16 g; Refill: 6   4. Glucose found in urine on examination    Evelina Dun, FNP

## 2017-11-14 NOTE — Patient Instructions (Signed)
Upper Respiratory Infection, Adult Most upper respiratory infections (URIs) are caused by a virus. A URI affects the nose, throat, and upper air passages. The most common type of URI is often called "the common cold." Follow these instructions at home:  Take medicines only as told by your doctor.  Gargle warm saltwater or take cough drops to comfort your throat as told by your doctor.  Use a warm mist humidifier or inhale steam from a shower to increase air moisture. This may make it easier to breathe.  Drink enough fluid to keep your pee (urine) clear or pale yellow.  Eat soups and other clear broths.  Have a healthy diet.  Rest as needed.  Go back to work when your fever is gone or your doctor says it is okay. ? You may need to stay home longer to avoid giving your URI to others. ? You can also wear a face mask and wash your hands often to prevent spread of the virus.  Use your inhaler more if you have asthma.  Do not use any tobacco products, including cigarettes, chewing tobacco, or electronic cigarettes. If you need help quitting, ask your doctor. Contact a doctor if:  You are getting worse, not better.  Your symptoms are not helped by medicine.  You have chills.  You are getting more short of breath.  You have brown or red mucus.  You have yellow or brown discharge from your nose.  You have pain in your face, especially when you bend forward.  You have a fever.  You have puffy (swollen) neck glands.  You have pain while swallowing.  You have white areas in the back of your throat. Get help right away if:  You have very bad or constant: ? Headache. ? Ear pain. ? Pain in your forehead, behind your eyes, and over your cheekbones (sinus pain). ? Chest pain.  You have long-lasting (chronic) lung disease and any of the following: ? Wheezing. ? Long-lasting cough. ? Coughing up blood. ? A change in your usual mucus.  You have a stiff neck.  You have  changes in your: ? Vision. ? Hearing. ? Thinking. ? Mood. This information is not intended to replace advice given to you by your health care provider. Make sure you discuss any questions you have with your health care provider. Document Released: 06/29/2007 Document Revised: 09/13/2015 Document Reviewed: 04/17/2013 Elsevier Interactive Patient Education  2018 [employer]  

## 2017-11-15 ENCOUNTER — Telehealth: Payer: Self-pay | Admitting: Family Medicine

## 2017-11-15 LAB — CMP14+EGFR
ALT: 17 IU/L (ref 0–44)
AST: 23 IU/L (ref 0–40)
Albumin/Globulin Ratio: 1.8 (ref 1.2–2.2)
Albumin: 4.3 g/dL (ref 3.5–4.8)
Alkaline Phosphatase: 57 IU/L (ref 39–117)
BUN/Creatinine Ratio: 21 (ref 10–24)
BUN: 23 mg/dL (ref 8–27)
Bilirubin Total: 0.3 mg/dL (ref 0.0–1.2)
CO2: 21 mmol/L (ref 20–29)
Calcium: 9 mg/dL (ref 8.6–10.2)
Chloride: 100 mmol/L (ref 96–106)
Creatinine, Ser: 1.08 mg/dL (ref 0.76–1.27)
GFR calc Af Amer: 78 mL/min/{1.73_m2} (ref >59)
GFR calc non Af Amer: 67 mL/min/{1.73_m2} (ref >59)
Globulin, Total: 2.4 g/dL (ref 1.5–4.5)
Glucose: 178 mg/dL — ABNORMAL HIGH (ref 65–99)
Potassium: 4.7 mmol/L (ref 3.5–5.2)
Sodium: 138 mmol/L (ref 134–144)
Total Protein: 6.7 g/dL (ref 6.0–8.5)

## 2017-11-15 NOTE — Telephone Encounter (Signed)
lmtcb

## 2017-11-19 DIAGNOSIS — I509 Heart failure, unspecified: Secondary | ICD-10-CM | POA: Diagnosis not present

## 2017-11-19 DIAGNOSIS — J449 Chronic obstructive pulmonary disease, unspecified: Secondary | ICD-10-CM | POA: Diagnosis not present

## 2017-11-19 DIAGNOSIS — Z87891 Personal history of nicotine dependence: Secondary | ICD-10-CM | POA: Diagnosis not present

## 2017-11-19 DIAGNOSIS — Z7902 Long term (current) use of antithrombotics/antiplatelets: Secondary | ICD-10-CM | POA: Diagnosis not present

## 2017-11-19 DIAGNOSIS — Z794 Long term (current) use of insulin: Secondary | ICD-10-CM | POA: Diagnosis not present

## 2017-11-19 DIAGNOSIS — N189 Chronic kidney disease, unspecified: Secondary | ICD-10-CM | POA: Diagnosis not present

## 2017-11-19 DIAGNOSIS — E039 Hypothyroidism, unspecified: Secondary | ICD-10-CM | POA: Diagnosis not present

## 2017-11-19 DIAGNOSIS — E1065 Type 1 diabetes mellitus with hyperglycemia: Secondary | ICD-10-CM | POA: Diagnosis not present

## 2017-11-19 DIAGNOSIS — R251 Tremor, unspecified: Secondary | ICD-10-CM | POA: Diagnosis not present

## 2017-11-19 DIAGNOSIS — E1022 Type 1 diabetes mellitus with diabetic chronic kidney disease: Secondary | ICD-10-CM | POA: Diagnosis not present

## 2017-11-19 DIAGNOSIS — D696 Thrombocytopenia, unspecified: Secondary | ICD-10-CM | POA: Diagnosis not present

## 2017-11-19 DIAGNOSIS — R42 Dizziness and giddiness: Secondary | ICD-10-CM | POA: Diagnosis not present

## 2017-11-19 DIAGNOSIS — Z952 Presence of prosthetic heart valve: Secondary | ICD-10-CM | POA: Diagnosis not present

## 2017-11-19 DIAGNOSIS — Z79899 Other long term (current) drug therapy: Secondary | ICD-10-CM | POA: Diagnosis not present

## 2017-11-20 ENCOUNTER — Encounter: Payer: Self-pay | Admitting: Family Medicine

## 2017-11-20 ENCOUNTER — Ambulatory Visit (INDEPENDENT_AMBULATORY_CARE_PROVIDER_SITE_OTHER): Payer: Medicare Other | Admitting: Family Medicine

## 2017-11-20 VITALS — BP 134/88 | HR 83 | Temp 96.6°F | Ht 73.0 in | Wt 251.8 lb

## 2017-11-20 DIAGNOSIS — K219 Gastro-esophageal reflux disease without esophagitis: Secondary | ICD-10-CM | POA: Diagnosis not present

## 2017-11-20 DIAGNOSIS — E039 Hypothyroidism, unspecified: Secondary | ICD-10-CM | POA: Diagnosis not present

## 2017-11-20 DIAGNOSIS — E785 Hyperlipidemia, unspecified: Secondary | ICD-10-CM | POA: Diagnosis not present

## 2017-11-20 DIAGNOSIS — I10 Essential (primary) hypertension: Secondary | ICD-10-CM

## 2017-11-20 DIAGNOSIS — E119 Type 2 diabetes mellitus without complications: Secondary | ICD-10-CM

## 2017-11-20 LAB — BAYER DCA HB A1C WAIVED: HB A1C: 7 % — AB (ref <7.0)

## 2017-11-20 NOTE — Telephone Encounter (Signed)
Aware of lab results per result note

## 2017-11-20 NOTE — Progress Notes (Signed)
BP 134/88   Pulse 83   Temp (!) 96.6 F (35.9 C) (Oral)   Ht _0  (1.854 m)   Wt 251 lb 12.8 oz (114.2 kg)   BMI 33.22 kg/m    Subjective:    Patient ID: [name], male    DOB: Nov 10, 1945, 72 y.o.   MRN: [id number]  HPI: [name] is a 72 y.o. male presenting on 11/20/2017 for Medical Management of Chronic Issues (3 month)   HPI Type 2 diabetes mellitus Patient comes in today for recheck of his diabetes. Patient has been currently taking Levemir 42 twice daily and metformin 1000 twice daily and Ozempic. Patient is not currently on an ACE inhibitor/ARB. Patient has not seen an ophthalmologist this year.  Patient was in the emergency department yesterday at [facility] rocking him for tingling and numbness and burning in his lower extremities and was told that he had diabetic neuropathy and was given a prescription for gabapentin 300 mg nightly which he has started just yesterday and denies any side effects from it.  If he continues to do well on it then we will continue this medication for him.  Hyperlipidemia Patient is coming in for recheck of his hyperlipidemia. The patient is currently taking simvastatin. They deny any issues with myalgias or history of liver damage from it. They deny any focal numbness or weakness or chest pain.   Hypertension Patient is currently on propranolol, and their blood pressure today is 134/88. Patient denies any lightheadedness or dizziness. Patient denies headaches, blurred vision, chest pains, shortness of breath, or weakness. Denies any side effects from medication and is content with current medication.   GERD Patient is currently on no medication currently for GERD,.  She denies any major symptoms or abdominal pain or belching or burping. She denies any blood in her stool or lightheadedness or dizziness.   Hypothyroidism recheck Patient is coming in for thyroid recheck today as well. They deny any issues with hair changes or heat or cold  problems or diarrhea or constipation. They deny any chest pain or palpitations. They are currently on levothyroxine 278mcrograms   Relevant past medical, surgical, family and social history reviewed and updated as indicated. Interim medical history since our last visit reviewed. Allergies and medications reviewed and updated.  Review of Systems  Constitutional: Negative for chills and fever.  Eyes: Negative for visual disturbance.  Respiratory: Negative for shortness of breath and wheezing.   Cardiovascular: Negative for chest pain and leg swelling.  Musculoskeletal: Negative for back pain and gait problem.  Skin: Negative for rash.  Neurological: Positive for numbness. Negative for dizziness, weakness, light-headedness and headaches.  All other systems reviewed and are negative.   Per HPI unless specifically indicated above   Allergies as of 11/20/2017      Reactions   Phenothiazines Anaphylaxis   Actos [pioglitazone] Swelling      Medication List        Accurate as of 11/20/17  8:25 AM. Always use your most recent med list.          acetaminophen 500 MG tablet Commonly known as:  TYLENOL Take 1,000 mg by mouth every 6 (six) hours as needed (pain).   cholecalciferol 1000 units tablet Commonly known as:  VITAMIN D Take 1,000 Units by mouth daily.   Fish Oil 1200 MG Caps Take 1,200-2,400 mg by mouth 2 (two) times daily. Take 1 capsule (1200 mg) by mouth daily at noon and 2 capsules (2400 mg)  daily at bedtime   fluticasone 50 MCG/ACT nasal spray Commonly known as:  FLONASE Place 2 sprays into both nostrils daily.   furosemide 20 MG tablet Commonly known as:  LASIX Take 1 tablet (20 mg total) by mouth daily.   gabapentin 300 MG capsule Commonly known as:  NEURONTIN Take 300 mg by mouth at bedtime.   glucose blood test strip 1 strip by Does not apply route 3 (three) times daily. Uses true metrix strips (diabetic club)   insulin detemir 100 UNIT/ML  injection Commonly known as:  LEVEMIR Inject 0.47-0.48 mLs (47-48 Units total) into the skin 2 (two) times daily.   Insulin Pen Needle 31G X 8 MM Misc Use to inject Victoza qd   levothyroxine 200 MCG tablet Commonly known as:  SYNTHROID, LEVOTHROID Take 1 tablet (200 mcg total) by mouth daily before breakfast.   MENS MULTI VITAMIN & MINERAL PO Take 1 tablet by mouth daily.   metFORMIN 1000 MG tablet Commonly known as:  GLUCOPHAGE TAKE 1 TABLET BY MOUTH TWICE DAILY WITH A MEAL.   OZEMPIC (0.25 OR 0.5 MG/DOSE) 2 MG/1.5ML Sopn Generic drug:  Semaglutide(0.25 or 0.5MG/DOS) Inject into the skin.   propranolol 20 MG tablet Commonly known as:  INDERAL Take 20 mg by mouth 2 (two) times daily.   simvastatin 80 MG tablet Commonly known as:  ZOCOR Take 0.5 tablets (40 mg total) by mouth at bedtime.   tamsulosin 0.4 MG Caps capsule Commonly known as:  FLOMAX TAKE TWO CAPSULES BY MOUTH AT BEDTIME          Objective:    BP 134/88   Pulse 83   Temp (!) 96.6 F (35.9 C) (Oral)   Ht _0  (1.854 m)   Wt 251 lb 12.8 oz (114.2 kg)   BMI 33.22 kg/m   Wt Readings from Last 3 Encounters:  11/20/17 251 lb 12.8 oz (114.2 kg)  11/14/17 252 lb 12.8 oz (114.7 kg)  10/31/17 245 lb (111.1 kg)    Physical Exam  Constitutional: He is oriented to person, place, and time. He appears well-developed and well-nourished. No distress.  Eyes: Conjunctivae are normal. No scleral icterus.  Neck: Neck supple. No thyromegaly present.  Cardiovascular: Normal rate, regular rhythm, normal heart sounds and intact distal pulses.  No murmur heard. Pulmonary/Chest: Effort normal and breath sounds normal. No respiratory distress. He has no wheezes.  Musculoskeletal: Normal range of motion. He exhibits no edema.  Lymphadenopathy:    He has no cervical adenopathy.  Neurological: He is alert and oriented to person, place, and time. Coordination normal.  Skin: Skin is warm and dry. No rash noted. He is not  diaphoretic.  Psychiatric: He has a normal mood and affect. His behavior is normal.  Nursing note and vitals reviewed.       Assessment & Plan:   Problem List Items Addressed This Visit      Cardiovascular and Mediastinum   Essential hypertension   Relevant Orders   CMP14+EGFR     Digestive   GERD (gastroesophageal reflux disease)   Relevant Orders   CBC with Differential/Platelet     Endocrine   DM type 2, not at goal [facility]) - Primary (Chronic)   Relevant Orders   CMP14+EGFR   Bayer DCA Hb A1c Waived   Hypothyroidism   Relevant Orders   CBC with Differential/Platelet   TSH     Other   Hyperlipidemia with target LDL less than 100   Relevant Orders   Lipid  panel       Follow up plan: Return in about 3 months (around 02/20/2018), or if symptoms worsen or fail to improve, for Diabetes and hypertension recheck.  Counseling provided for all of the vaccine components Orders Placed This Encounter  Procedures  . CMP14+EGFR  . CBC with Differential/Platelet  . Lipid panel  . Bayer DCA Hb A1c Waived  . TSH    Caryl Pina, MD [facility] 11/20/2017, 8:25 AM

## 2017-11-21 ENCOUNTER — Telehealth: Payer: Self-pay | Admitting: Family Medicine

## 2017-11-21 LAB — CBC WITH DIFFERENTIAL/PLATELET
Basophils Absolute: 0.1 10*3/uL (ref 0.0–0.2)
Basos: 1 %
EOS (ABSOLUTE): 0.2 10*3/uL (ref 0.0–0.4)
EOS: 3 %
HEMATOCRIT: 38.8 % (ref 37.5–51.0)
HEMOGLOBIN: 13 g/dL (ref 13.0–17.7)
Immature Grans (Abs): 0 10*3/uL (ref 0.0–0.1)
Immature Granulocytes: 0 %
Lymphocytes Absolute: 2.1 10*3/uL (ref 0.7–3.1)
Lymphs: 30 %
MCH: 31.8 pg (ref 26.6–33.0)
MCHC: 33.5 g/dL (ref 31.5–35.7)
MCV: 95 fL (ref 79–97)
MONOS ABS: 0.6 10*3/uL (ref 0.1–0.9)
Monocytes: 9 %
NEUTROS ABS: 4 10*3/uL (ref 1.4–7.0)
Neutrophils: 57 %
Platelets: 93 10*3/uL — CL (ref 150–450)
RBC: 4.09 x10E6/uL — AB (ref 4.14–5.80)
RDW: 14.5 % (ref 12.3–15.4)
WBC: 7.1 10*3/uL (ref 3.4–10.8)

## 2017-11-21 LAB — CMP14+EGFR
ALBUMIN: 4.2 g/dL (ref 3.5–4.8)
ALK PHOS: 55 IU/L (ref 39–117)
ALT: 15 IU/L (ref 0–44)
AST: 18 IU/L (ref 0–40)
Albumin/Globulin Ratio: 1.8 (ref 1.2–2.2)
BILIRUBIN TOTAL: 0.4 mg/dL (ref 0.0–1.2)
BUN/Creatinine Ratio: 14 (ref 10–24)
BUN: 18 mg/dL (ref 8–27)
CHLORIDE: 101 mmol/L (ref 96–106)
CO2: 24 mmol/L (ref 20–29)
CREATININE: 1.3 mg/dL — AB (ref 0.76–1.27)
Calcium: 8.9 mg/dL (ref 8.6–10.2)
GFR calc non Af Amer: 54 mL/min/{1.73_m2} — ABNORMAL LOW (ref >59)
GFR, EST AFRICAN AMERICAN: 62 mL/min/{1.73_m2} (ref >59)
Globulin, Total: 2.3 g/dL (ref 1.5–4.5)
Glucose: 150 mg/dL — ABNORMAL HIGH (ref 65–99)
Potassium: 4.8 mmol/L (ref 3.5–5.2)
SODIUM: 138 mmol/L (ref 134–144)
Total Protein: 6.5 g/dL (ref 6.0–8.5)

## 2017-11-21 LAB — LIPID PANEL
CHOL/HDL RATIO: 2.6 ratio (ref 0.0–5.0)
Cholesterol, Total: 117 mg/dL (ref 100–199)
HDL: 45 mg/dL (ref >39)
LDL Calculated: 59 mg/dL (ref 0–99)
TRIGLYCERIDES: 66 mg/dL (ref 0–149)
VLDL CHOLESTEROL CAL: 13 mg/dL (ref 5–40)

## 2017-11-21 LAB — TSH: TSH: 0.182 u[IU]/mL — ABNORMAL LOW (ref 0.450–4.500)

## 2017-11-21 NOTE — Telephone Encounter (Signed)
LM- provider will review and call back tomorrow.

## 2017-11-23 ENCOUNTER — Telehealth: Payer: Self-pay | Admitting: Family Medicine

## 2017-11-23 NOTE — Telephone Encounter (Signed)
Patient states he just spoke with someone.

## 2017-11-26 ENCOUNTER — Encounter: Payer: Self-pay | Admitting: Cardiology

## 2017-11-26 NOTE — Progress Notes (Signed)
HPI The patient presents for followup of aortic valve replacement, follow up of CAD, atrial fib and pacemaker placement for symptomatic bradycardia.  Since I last saw him he is done well.  He does have some arthritis but he tries to do some walking. The patient denies any new symptoms such as chest discomfort, neck or arm discomfort. There has been no new shortness of breath, PND or orthopnea. There have been no reported palpitations, presyncope or syncope.    Allergies  Allergen Reactions  . Phenothiazines Anaphylaxis  . Actos [Pioglitazone] Swelling    Current Outpatient Medications  Medication Sig Dispense Refill  . acetaminophen (TYLENOL) 500 MG tablet Take 1,000 mg by mouth every 6 (six) hours as needed (pain).    . cholecalciferol (VITAMIN D) 1000 units tablet Take 1,000 Units by mouth daily.     Marland Kitchen ELIQUIS 5 MG TABS tablet TAKE ONE TABLET BY MOUTH TWICE DAILY 60 tablet 5  . fluticasone (FLONASE) 50 MCG/ACT nasal spray Place 2 sprays into both nostrils daily. 16 g 6  . furosemide (LASIX) 20 MG tablet Take 1 tablet (20 mg total) by mouth daily. 30 tablet 1  . gabapentin (NEURONTIN) 300 MG capsule Take 300 mg by mouth at bedtime.    Marland Kitchen glucose blood test strip 1 strip by Does not apply route 3 (three) times daily. Uses true metrix strips (diabetic club)  12  . insulin detemir (LEVEMIR) 100 UNIT/ML injection Inject 0.47-0.48 mLs (47-48 Units total) into the skin 2 (two) times daily. (Patient taking differently: Inject 42 Units into the skin 2 (two) times daily. ) 10 mL   . Insulin Pen Needle (B-D ULTRAFINE III SHORT PEN) 31G X 8 MM MISC Use to inject Victoza qd 100 each 5  . levothyroxine (SYNTHROID, LEVOTHROID) 200 MCG tablet Take 1 tablet (200 mcg total) by mouth daily before breakfast. 30 tablet 1  . metFORMIN (GLUCOPHAGE) 1000 MG tablet TAKE 1 TABLET BY MOUTH TWICE DAILY WITH A MEAL. 180 tablet 0  . Multiple Vitamins-Minerals (MENS MULTI VITAMIN & MINERAL PO) Take 1 tablet by mouth  daily.      . Omega-3 Fatty Acids (FISH OIL) 1200 MG CAPS Take 1,200-2,400 mg by mouth 2 (two) times daily. Take 1 capsule (1200 mg) by mouth daily at noon and 2 capsules (2400 mg) daily at bedtime    . propranolol (INDERAL) 20 MG tablet Take 20 mg by mouth 2 (two) times daily.    . Semaglutide (OZEMPIC) 0.25 or 0.5 MG/DOSE SOPN Inject into the skin.    Marland Kitchen simvastatin (ZOCOR) 80 MG tablet Take 0.5 tablets (40 mg total) by mouth at bedtime. 15 tablet 0  . tamsulosin (FLOMAX) 0.4 MG CAPS capsule TAKE TWO CAPSULES BY MOUTH AT BEDTIME 180 capsule 1   No current facility-administered medications for this visit.     Past Medical History:  Diagnosis Date  . Arthritis   . Atrial fibrillation ([place])   . BPH (benign prostatic hypertrophy) 04/15/2010  . Cataract   . Colon polyps   . DEPRESSION 01/22/2009  . Heart valve replaced   . HYPERCHOLESTEROLEMIA 01/22/2009  . HYPERTENSION 01/22/2009   Dr. [name]  . 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 Morbid obesity ([place])   . Tremors of nervous system    ?ptsd  . Ulcer     Past Surgical History:  Procedure Laterality Date  . 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 APPENDECTOMY    .  bilateral cataract surg    . COLONOSCOPY  04/24/2007   [name]: normal  . COLONOSCOPY WITH ESOPHAGOGASTRODUODENOSCOPY (EGD) N/A 04/05/2012   Procedure: COLONOSCOPY WITH ESOPHAGOGASTRODUODENOSCOPY (EGD);  Surgeon: Daneil Dolin, MD;  Location: [facility];  Service: Endoscopy;  Laterality: N/A;  10;15  . DENTAL SURGERY  05/2004   Dental extractions  . EYE SURGERY Bilateral    cataract  . HEEL SPUR SURGERY Bilateral    resection of heel spur  . KNEE ARTHROSCOPY WITH LATERAL MENISECTOMY Right 04/04/2014   Procedure: KNEE ARTHROSCOPY WITH LATERAL MENISECTOMY;  Surgeon: Carole Civil, MD;  Location: [facility] ORS;  Service: Orthopedics;  Laterality: Right;  . PACEMAKER IMPLANT N/A 06/13/2016   Procedure:  Pacemaker Implant- Dual Chamber;  Surgeon: Evans Lance, MD;  Location: [place] CV LAB;  Service: Cardiovascular;  Laterality: N/A;  . PACEMAKER INSERTION  2018  . THYROIDECTOMY  03/21/2013   DR [name]  . THYROIDECTOMY N/A 03/21/2013   Procedure: THYROIDECTOMY;  Surgeon: [name], MD;  Location: [place];  Service: General;  Laterality: N/A;  . TONSILLECTOMY     ROS:   Positive for joint pain.  Otherwise as stated in the HPI and negative for all other systems.  PHYSICAL EXAM BP 107/66   Pulse 86   Ht 6\' 4"  (1.93 m)   Wt 257 lb 9.6 oz (116.8 kg)   SpO2 96%   BMI 31.36 kg/m   GENERAL:  Well appearing NECK:  No jugular venous distention, waveform within normal limits, carotid upstroke brisk and symmetric, no bruits, no thyromegaly LUNGS:  Clear to auscultation bilaterally CHEST: Well healed sternotomy scar, well healed pacemaker pocket.  HEART:  PMI not displaced or sustained,S1 and S2 within normal limits, no S3, no S4, no clicks, no rubs, 2 out of 6 brief apical systolic murmur radiating slightly at the aortic outflow tract, no diastolic murmurs ABD:  Flat, positive bowel sounds normal in frequency in pitch, no bruits, no rebound, no guarding, no midline pulsatile mass, no hepatomegaly, no splenomegaly EXT:  2 plus pulses throughout, no edema, no cyanosis no clubbing   Lab Results  Component Value Date   HGBA1C 7.3 03/25/2015   Lab Results  Component Value Date   CHOL 117 11/20/2017   TRIG 66 11/20/2017   HDL 45 11/20/2017   LDLCALC 59 11/20/2017   EKG:  10/18/17 NSR, IVCD  No acute ST T wave changes.  11/29/2017   ASSESSMENT AND PLAN  CAD: He had a low risk nuclear study in 2017.  No change in therapy.     CHRONIC SYSTOLIC AND DIASTOLIC HF:   He seems to be euvolemic.  No change in therapy.  AORTIC VALVE REPLACEMENT, HX OF: This was stable on echo in 2017.  I will check an echocardiogram as his valve is 74 years old.   HYPERTENSION:  The blood pressure is at  target.  No change in therapy.   CAROTID OCCLUSIVE DISEASE -  Carotid Doppler in July 2017 was mild.  He is due for follow up.  I will schedule follow up.   PPM: He has normal function with results as below.  I reviewed the most recent interrogation.    PAROXYSMAL ATRIAL FIB - 50.8% of the time he is in atrial fib per the pacemaker check in Sept.  He is tolerating blood thinner.  DM: A1C was 7.0.  He will continue meds as listed.    DYSLIPIDEMIA: Lipids are at target as above.

## 2017-11-27 ENCOUNTER — Other Ambulatory Visit: Payer: Self-pay | Admitting: Family Medicine

## 2017-11-29 ENCOUNTER — Encounter: Payer: Self-pay | Admitting: Cardiology

## 2017-11-29 ENCOUNTER — Ambulatory Visit (INDEPENDENT_AMBULATORY_CARE_PROVIDER_SITE_OTHER): Payer: Medicare Other | Admitting: Cardiology

## 2017-11-29 VITALS — BP 107/66 | HR 86 | Ht 76.0 in | Wt 257.6 lb

## 2017-11-29 DIAGNOSIS — H40013 Open angle with borderline findings, low risk, bilateral: Secondary | ICD-10-CM | POA: Diagnosis not present

## 2017-11-29 DIAGNOSIS — Z794 Long term (current) use of insulin: Secondary | ICD-10-CM

## 2017-11-29 DIAGNOSIS — H35033 Hypertensive retinopathy, bilateral: Secondary | ICD-10-CM | POA: Diagnosis not present

## 2017-11-29 DIAGNOSIS — I6523 Occlusion and stenosis of bilateral carotid arteries: Secondary | ICD-10-CM | POA: Diagnosis not present

## 2017-11-29 DIAGNOSIS — I359 Nonrheumatic aortic valve disorder, unspecified: Secondary | ICD-10-CM

## 2017-11-29 DIAGNOSIS — I10 Essential (primary) hypertension: Secondary | ICD-10-CM | POA: Diagnosis not present

## 2017-11-29 DIAGNOSIS — E785 Hyperlipidemia, unspecified: Secondary | ICD-10-CM

## 2017-11-29 DIAGNOSIS — E118 Type 2 diabetes mellitus with unspecified complications: Secondary | ICD-10-CM | POA: Diagnosis not present

## 2017-11-29 DIAGNOSIS — I480 Paroxysmal atrial fibrillation: Secondary | ICD-10-CM

## 2017-11-29 DIAGNOSIS — H43393 Other vitreous opacities, bilateral: Secondary | ICD-10-CM | POA: Diagnosis not present

## 2017-11-29 DIAGNOSIS — E113293 Type 2 diabetes mellitus with mild nonproliferative diabetic retinopathy without macular edema, bilateral: Secondary | ICD-10-CM | POA: Diagnosis not present

## 2017-11-29 NOTE — Patient Instructions (Signed)
Medication Instructions:  Continue current medications  If you need a refill on your cardiac medications before your next appointment, please call your pharmacy.  Labwork: None Ordered   If you have labs (blood work) drawn today and your tests are completely normal, you will receive your results only by: Marland Kitchen MyChart Message (if you have MyChart) OR . A paper copy in the mail If you have any lab test that is abnormal or we need to change your treatment, we will call you to review the results.  Testing/Procedures: Your physician has requested that you have an echocardiogram [facility]. Echocardiography is a painless test that uses sound waves to create images of your heart. It provides your doctor with information about the size and shape of your heart and how well your heart's chambers and valves are working. This procedure takes approximately one hour. There are no restrictions for this procedure.  Your physician has requested that you have a carotid duplex [facility]. This test is an ultrasound of the carotid arteries in your neck. It looks at blood flow through these arteries that supply the brain with blood. Allow one hour for this exam. There are no restrictions or special instructions.    Follow-Up: You will need a follow up appointment in 6 Months.  Please call our office 2 months in advance([phone]) to schedule the (6 Months) appointment.  You may see  DR [name], or one of the following Advanced Practice Providers on your designated Care Team:    . Jory Sims, DNP, ANP . Rhonda Barrett, PA-C  . Kerin Ransom, [place]  . Almyra Deforest, PA-C . Fabian Sharp, PA-C  At [facility], you and your health needs are our priority.  As part of our continuing mission to provide you with exceptional heart care, we have created designated Provider Care Teams.  These Care Teams include your primary Cardiologist (physician) and Advanced Practice Providers (APPs -  Physician  Assistants and Nurse Practitioners) who all work together to provide you with the care you need, when you need it.  Thank you for choosing [facility] at [facility]!!

## 2017-12-07 ENCOUNTER — Ambulatory Visit (HOSPITAL_COMMUNITY)
Admission: RE | Admit: 2017-12-07 | Discharge: 2017-12-07 | Disposition: A | Discharge status: Home / Self Care | Payer: Medicare Other | Source: Ambulatory Visit | Attending: Cardiology | Admitting: Cardiology

## 2017-12-07 DIAGNOSIS — I359 Nonrheumatic aortic valve disorder, unspecified: Secondary | ICD-10-CM | POA: Diagnosis not present

## 2017-12-07 DIAGNOSIS — I6523 Occlusion and stenosis of bilateral carotid arteries: Secondary | ICD-10-CM

## 2017-12-07 NOTE — Progress Notes (Signed)
*  PRELIMINARY RESULTS* Echocardiogram 2D Echocardiogram has been performed.  [name] 12/07/2017, 11:13 AM

## 2017-12-13 ENCOUNTER — Ambulatory Visit (INDEPENDENT_AMBULATORY_CARE_PROVIDER_SITE_OTHER): Payer: Medicare Other

## 2017-12-13 DIAGNOSIS — I442 Atrioventricular block, complete: Secondary | ICD-10-CM | POA: Diagnosis not present

## 2017-12-14 NOTE — Progress Notes (Signed)
Remote pacemaker transmission.   

## 2017-12-20 ENCOUNTER — Ambulatory Visit: Payer: Self-pay | Admitting: *Deleted

## 2017-12-20 ENCOUNTER — Other Ambulatory Visit: Payer: Self-pay | Admitting: Family Medicine

## 2017-12-20 DIAGNOSIS — E119 Type 2 diabetes mellitus without complications: Secondary | ICD-10-CM | POA: Diagnosis not present

## 2017-12-20 DIAGNOSIS — Z794 Long term (current) use of insulin: Secondary | ICD-10-CM | POA: Diagnosis not present

## 2017-12-20 DIAGNOSIS — R202 Paresthesia of skin: Secondary | ICD-10-CM

## 2017-12-20 DIAGNOSIS — Z888 Allergy status to other drugs, medicaments and biological substances status: Secondary | ICD-10-CM | POA: Diagnosis not present

## 2017-12-20 DIAGNOSIS — J449 Chronic obstructive pulmonary disease, unspecified: Secondary | ICD-10-CM | POA: Diagnosis not present

## 2017-12-20 DIAGNOSIS — M79672 Pain in left foot: Secondary | ICD-10-CM | POA: Diagnosis not present

## 2017-12-20 DIAGNOSIS — I10 Essential (primary) hypertension: Secondary | ICD-10-CM

## 2017-12-20 DIAGNOSIS — I5030 Unspecified diastolic (congestive) heart failure: Secondary | ICD-10-CM

## 2017-12-20 DIAGNOSIS — Z79899 Other long term (current) drug therapy: Secondary | ICD-10-CM | POA: Diagnosis not present

## 2017-12-20 DIAGNOSIS — Z87891 Personal history of nicotine dependence: Secondary | ICD-10-CM | POA: Diagnosis not present

## 2017-12-20 DIAGNOSIS — Z952 Presence of prosthetic heart valve: Secondary | ICD-10-CM | POA: Diagnosis not present

## 2017-12-20 DIAGNOSIS — Z7901 Long term (current) use of anticoagulants: Secondary | ICD-10-CM | POA: Diagnosis not present

## 2017-12-20 DIAGNOSIS — I509 Heart failure, unspecified: Secondary | ICD-10-CM | POA: Diagnosis not present

## 2017-12-20 DIAGNOSIS — E785 Hyperlipidemia, unspecified: Secondary | ICD-10-CM

## 2017-12-20 DIAGNOSIS — E039 Hypothyroidism, unspecified: Secondary | ICD-10-CM | POA: Diagnosis not present

## 2017-12-20 MED ORDER — GABAPENTIN 300 MG PO CAPS: 300.0000 mg | ORAL_CAPSULE | Freq: Every day | ORAL | 3 refills | Status: DC

## 2017-12-20 NOTE — Patient Instructions (Signed)
Please do not hesitate to contact the care management team with any questions you may have about chronic care management services or other health related or care coordination needs.   Janalyn Shy MHA,BSN,RN,CCM Nurse Care Coordinator [facility] [phone]

## 2017-12-20 NOTE — Telephone Encounter (Signed)
Please advise 

## 2017-12-20 NOTE — Telephone Encounter (Signed)
Pt aware.

## 2017-12-20 NOTE — Telephone Encounter (Signed)
I sent refill for gabapentin

## 2017-12-20 NOTE — Chronic Care Management (AMB) (Signed)
  Chronic Care Management   Note  12/20/2017 Name: [name] MRN: [id number] DOB: Dec 19, 1945   Successful telephone outreach today to [name] in response to a referral received by his health plan. Based on our conversation today, [name] declines enrollment in CCM services. Should [name] wish to engage with the care management team at any point in the future, areas of potential engagement are:   Chronic Disease Management  DMII - Last A1C = 7; patient reports getting medications from [place] where he has no copay; may benefit from diabetes education and long term goal setting  New Diagnosis Peripheral Neuropathy - patient reported today improvement since starting Gabapentin; called this morning for refill Lab Results  Component Value Date   HGBA1C 7.3 03/25/2015   HGBA1C 6.5 12/01/2014   HGBA1C 6.4 11/10/2014   Lab Results  Component Value Date   MICROALBUR 20 12/09/2013   LDLCALC 59 11/20/2017   CREATININE 1.30 (H) 11/20/2017     Cardiovascular Disease - patient has medical history which includes HTN, CHF, Carotid Stenosis, and Afib w/ PPM (2018); was not able to discuss with [name] his current self monitoring and management activities - may benefit from cardiovascular disease management and education   BP Readings from Last 3 Encounters:  11/29/17 107/66  11/20/17 134/88  11/14/17 124/68    Plan: We will notify [name] of our conversation with [name] who is scheduled to see him in the office on Monday. Should [name] wish to engage in chronic care management services at any time in the future, we are happy to provide assistance.   [place]  [phone]

## 2017-12-25 ENCOUNTER — Ambulatory Visit: Payer: Medicare Other | Admitting: Family Medicine

## 2018-01-30 ENCOUNTER — Ambulatory Visit (INDEPENDENT_AMBULATORY_CARE_PROVIDER_SITE_OTHER): Payer: Medicare Other | Admitting: Family Medicine

## 2018-01-30 ENCOUNTER — Encounter: Payer: Self-pay | Admitting: Family Medicine

## 2018-01-30 VITALS — BP 119/65 | HR 79 | Temp 96.9°F | Ht 76.0 in | Wt 260.6 lb

## 2018-01-30 DIAGNOSIS — N3000 Acute cystitis without hematuria: Secondary | ICD-10-CM

## 2018-01-30 DIAGNOSIS — E1142 Type 2 diabetes mellitus with diabetic polyneuropathy: Secondary | ICD-10-CM | POA: Diagnosis not present

## 2018-01-30 DIAGNOSIS — M545 Low back pain: Secondary | ICD-10-CM | POA: Diagnosis not present

## 2018-01-30 DIAGNOSIS — E1140 Type 2 diabetes mellitus with diabetic neuropathy, unspecified: Secondary | ICD-10-CM | POA: Insufficient documentation

## 2018-01-30 LAB — URINALYSIS, COMPLETE
Bilirubin, UA: NEGATIVE
Glucose, UA: NEGATIVE
KETONES UA: NEGATIVE
Nitrite, UA: NEGATIVE
PH UA: 7 (ref 5.0–7.5)
Protein, UA: NEGATIVE
RBC, UA: NEGATIVE
Specific Gravity, UA: 1.02 (ref 1.005–1.030)
Urobilinogen, Ur: 0.2 mg/dL (ref 0.2–1.0)

## 2018-01-30 LAB — MICROSCOPIC EXAMINATION: RBC, UA: NONE SEEN /hpf (ref 0–2)

## 2018-01-30 MED ORDER — GABAPENTIN 300 MG PO CAPS: 300.0000 mg | ORAL_CAPSULE | Freq: Two times a day (BID) | ORAL | 3 refills | Status: DC

## 2018-01-30 MED ORDER — CEPHALEXIN 500 MG PO CAPS: 500.0000 mg | ORAL_CAPSULE | Freq: Four times a day (QID) | ORAL | 0 refills | Status: DC

## 2018-01-30 NOTE — Progress Notes (Signed)
BP 119/65   Pulse 79   Temp (!) 96.9 F (36.1 C) (Oral)   Ht 6\' 4"  (1.93 m)   Wt 260 lb 9.6 oz (118.2 kg)   BMI 31.72 kg/m    Subjective:    Patient ID: [name], male    DOB: 1945-04-17, 73 y.o.   MRN: [id number]  HPI: [name] is a 73 y.o. male presenting on 01/30/2018 for Back Pain (x 1 week- brought urine sample) and Foot Pain (Left foot- patient states it has been going on for a long time)   HPI Burning and tingling and numbness in his foot Patient is complaining about burning and tingling and numbness in his foot more on the left than the right.  He has had this before and has taken the gabapentin but is currently only taken it once a day.  He says it does help at night.  Patient says it has recently increased but it has been going on for a long time.  Patient complains of back pain in his lower back and urinary frequency and when he gets like this he typically says he has UTI and he did bring in a urine sample today to be tested.  He denies any fevers or chills or dysuria or hematuria.  He has not tried to use anything at home to improve it but he just usually gets like this in the knee comes and needs to get an antibiotic on previous times.  Patient does have diabetes and his sugars have not always been well controlled which is led to his recurrent infections.  Relevant past medical, surgical, family and social history reviewed and updated as indicated. Interim medical history since our last visit reviewed. Allergies and medications reviewed and updated.  Review of Systems  Constitutional: Negative for chills and fever.  Eyes: Negative for visual disturbance.  Respiratory: Negative for shortness of breath and wheezing.   Cardiovascular: Negative for chest pain and leg swelling.  Genitourinary: Positive for flank pain.  Musculoskeletal: Positive for back pain. Negative for gait problem.  Skin: Negative for rash.  Neurological: Positive for numbness. Negative  for dizziness, weakness and light-headedness.  All other systems reviewed and are negative.   Per HPI unless specifically indicated above   Allergies as of 01/30/2018      Reactions   Phenothiazines Anaphylaxis   Actos [pioglitazone] Swelling      Medication List       Accurate as of January 30, 2018  2:31 PM. Always use your most recent med list.        acetaminophen 500 MG tablet Commonly known as:  TYLENOL Take 1,000 mg by mouth every 6 (six) hours as needed (pain).   cephALEXin 500 MG capsule Commonly known as:  KEFLEX Take 1 capsule (500 mg total) by mouth 4 (four) times daily.   cholecalciferol 1000 units tablet Commonly known as:  VITAMIN D Take 1,000 Units by mouth daily.   ELIQUIS 5 MG Tabs tablet Generic drug:  apixaban TAKE ONE TABLET BY MOUTH TWICE DAILY   Fish Oil 1200 MG Caps Take 1,200-2,400 mg by mouth 2 (two) times daily. Take 1 capsule (1200 mg) by mouth daily at noon and 2 capsules (2400 mg) daily at bedtime   fluticasone 50 MCG/ACT nasal spray Commonly known as:  FLONASE Place 2 sprays into both nostrils daily.   furosemide 20 MG tablet Commonly known as:  LASIX Take 1 tablet (20 mg total) by mouth daily.  gabapentin 300 MG capsule Commonly known as:  NEURONTIN Take 1 capsule (300 mg total) by mouth 2 (two) times daily.   glucose blood test strip 1 strip by Does not apply route 3 (three) times daily. Uses true metrix strips (diabetic club)   insulin detemir 100 UNIT/ML injection Commonly known as:  LEVEMIR Inject 0.47-0.48 mLs (47-48 Units total) into the skin 2 (two) times daily.   Insulin Pen Needle 31G X 8 MM Misc Commonly known as:  B-D ULTRAFINE III SHORT PEN Use to inject Victoza qd   levothyroxine 200 MCG tablet Commonly known as:  SYNTHROID, LEVOTHROID Take 1 tablet (200 mcg total) by mouth daily before breakfast.   MENS MULTI VITAMIN & MINERAL PO Take 1 tablet by mouth daily.   metFORMIN 1000 MG tablet Commonly known as:   GLUCOPHAGE TAKE 1 TABLET BY MOUTH TWICE DAILY WITH A MEAL.   OZEMPIC (0.25 OR 0.5 MG/DOSE) 2 MG/1.5ML Sopn Generic drug:  Semaglutide(0.25 or 0.5MG /DOS) Inject into the skin.   propranolol 20 MG tablet Commonly known as:  INDERAL Take 20 mg by mouth 2 (two) times daily.   simvastatin 80 MG tablet Commonly known as:  ZOCOR Take 0.5 tablets (40 mg total) by mouth at bedtime.   tamsulosin 0.4 MG Caps capsule Commonly known as:  FLOMAX TAKE TWO CAPSULES BY MOUTH AT BEDTIME          Objective:    BP 119/65   Pulse 79   Temp (!) 96.9 F (36.1 C) (Oral)   Ht 6\' 4"  (1.93 m)   Wt 260 lb 9.6 oz (118.2 kg)   BMI 31.72 kg/m   Wt Readings from Last 3 Encounters:  01/30/18 260 lb 9.6 oz (118.2 kg)  11/29/17 257 lb 9.6 oz (116.8 kg)  11/20/17 251 lb 12.8 oz (114.2 kg)    Physical Exam Vitals signs and nursing note reviewed.  Constitutional:      General: He is not in acute distress.    Appearance: He is well-developed. He is not diaphoretic.  Eyes:     General: No scleral icterus.    Conjunctiva/sclera: Conjunctivae normal.  Neck:     Musculoskeletal: Neck supple.     Thyroid: No thyromegaly.  Cardiovascular:     Rate and Rhythm: Normal rate and regular rhythm.     Heart sounds: Normal heart sounds. No murmur.  Pulmonary:     Effort: Pulmonary effort is normal. No respiratory distress.     Breath sounds: Normal breath sounds. No wheezing.  Abdominal:     General: Abdomen is flat. Bowel sounds are normal. There is no distension.     Tenderness: There is no abdominal tenderness. There is right CVA tenderness and left CVA tenderness. There is no guarding.  Musculoskeletal: Normal range of motion.  Lymphadenopathy:     Cervical: No cervical adenopathy.  Skin:    General: Skin is warm and dry.     Findings: No rash.  Neurological:     Mental Status: He is alert and oriented to person, place, and time.     Coordination: Coordination normal.  Psychiatric:         Behavior: Behavior normal.    Urinalysis 3+ leukocytes    Assessment & Plan:   Problem List Items Addressed This Visit      Endocrine   Diabetic neuropathy ([place])   Relevant Medications   gabapentin (NEURONTIN) 300 MG capsule    Other Visit Diagnoses    Acute cystitis without hematuria    -  Primary   Relevant Medications   cephALEXin (KEFLEX) 500 MG capsule   Other Relevant Orders   Urinalysis, Complete      Follow up plan: Return in about 4 weeks (around 02/27/2018), or if symptoms worsen or fail to improve.  Counseling provided for all of the vaccine components Orders Placed This Encounter  Procedures  . Urinalysis, Complete    Caryl Pina, MD [facility] 01/30/2018, 2:31 PM

## 2018-01-31 ENCOUNTER — Inpatient Hospital Stay (HOSPITAL_COMMUNITY): Payer: Medicare Other | Attending: Hematology

## 2018-01-31 DIAGNOSIS — D696 Thrombocytopenia, unspecified: Secondary | ICD-10-CM | POA: Diagnosis not present

## 2018-01-31 DIAGNOSIS — K219 Gastro-esophageal reflux disease without esophagitis: Secondary | ICD-10-CM | POA: Insufficient documentation

## 2018-01-31 DIAGNOSIS — N390 Urinary tract infection, site not specified: Secondary | ICD-10-CM | POA: Insufficient documentation

## 2018-01-31 DIAGNOSIS — E039 Hypothyroidism, unspecified: Secondary | ICD-10-CM | POA: Diagnosis not present

## 2018-01-31 DIAGNOSIS — E785 Hyperlipidemia, unspecified: Secondary | ICD-10-CM | POA: Diagnosis not present

## 2018-01-31 DIAGNOSIS — I110 Hypertensive heart disease with heart failure: Secondary | ICD-10-CM | POA: Insufficient documentation

## 2018-01-31 DIAGNOSIS — R011 Cardiac murmur, unspecified: Secondary | ICD-10-CM | POA: Diagnosis not present

## 2018-01-31 DIAGNOSIS — E538 Deficiency of other specified B group vitamins: Secondary | ICD-10-CM | POA: Diagnosis not present

## 2018-01-31 DIAGNOSIS — Z79899 Other long term (current) drug therapy: Secondary | ICD-10-CM | POA: Insufficient documentation

## 2018-01-31 DIAGNOSIS — R001 Bradycardia, unspecified: Secondary | ICD-10-CM | POA: Diagnosis not present

## 2018-01-31 DIAGNOSIS — F329 Major depressive disorder, single episode, unspecified: Secondary | ICD-10-CM | POA: Insufficient documentation

## 2018-01-31 DIAGNOSIS — Z87891 Personal history of nicotine dependence: Secondary | ICD-10-CM | POA: Diagnosis not present

## 2018-01-31 DIAGNOSIS — E119 Type 2 diabetes mellitus without complications: Secondary | ICD-10-CM | POA: Diagnosis not present

## 2018-01-31 DIAGNOSIS — I700 Atherosclerosis of aorta: Secondary | ICD-10-CM | POA: Insufficient documentation

## 2018-01-31 DIAGNOSIS — Z7901 Long term (current) use of anticoagulants: Secondary | ICD-10-CM | POA: Insufficient documentation

## 2018-01-31 DIAGNOSIS — I4891 Unspecified atrial fibrillation: Secondary | ICD-10-CM | POA: Insufficient documentation

## 2018-01-31 LAB — CBC WITH DIFFERENTIAL/PLATELET
Abs Immature Granulocytes: 0.03 10*3/uL (ref 0.00–0.07)
Basophils Absolute: 0.1 10*3/uL (ref 0.0–0.1)
Basophils Relative: 1 %
EOS PCT: 5 %
Eosinophils Absolute: 0.4 10*3/uL (ref 0.0–0.5)
HCT: 39.1 % (ref 39.0–52.0)
Hemoglobin: 12.8 g/dL — ABNORMAL LOW (ref 13.0–17.0)
Immature Granulocytes: 0 %
Lymphocytes Relative: 24 %
Lymphs Abs: 1.8 10*3/uL (ref 0.7–4.0)
MCH: 32.8 pg (ref 26.0–34.0)
MCHC: 32.7 g/dL (ref 30.0–36.0)
MCV: 100.3 fL — ABNORMAL HIGH (ref 80.0–100.0)
Monocytes Absolute: 0.7 10*3/uL (ref 0.1–1.0)
Monocytes Relative: 9 %
Neutro Abs: 4.5 10*3/uL (ref 1.7–7.7)
Neutrophils Relative %: 61 %
Platelets: 83 10*3/uL — ABNORMAL LOW (ref 150–400)
RBC: 3.9 MIL/uL — ABNORMAL LOW (ref 4.22–5.81)
RDW: 14.5 % (ref 11.5–15.5)
WBC: 7.4 10*3/uL (ref 4.0–10.5)
nRBC: 0 % (ref 0.0–0.2)

## 2018-01-31 LAB — LACTATE DEHYDROGENASE: LDH: 157 U/L (ref 98–192)

## 2018-01-31 LAB — COMPREHENSIVE METABOLIC PANEL
ALT: 22 U/L (ref 0–44)
AST: 25 U/L (ref 15–41)
Albumin: 3.6 g/dL (ref 3.5–5.0)
Alkaline Phosphatase: 48 U/L (ref 38–126)
Anion gap: 7 (ref 5–15)
BUN: 21 mg/dL (ref 8–23)
CO2: 26 mmol/L (ref 22–32)
Calcium: 8.7 mg/dL — ABNORMAL LOW (ref 8.9–10.3)
Chloride: 103 mmol/L (ref 98–111)
Creatinine, Ser: 1.22 mg/dL (ref 0.61–1.24)
GFR calc non Af Amer: 58 mL/min — ABNORMAL LOW (ref >60)
Glucose, Bld: 269 mg/dL — ABNORMAL HIGH (ref 70–99)
Potassium: 4.5 mmol/L (ref 3.5–5.1)
Sodium: 136 mmol/L (ref 135–145)
TOTAL PROTEIN: 6.7 g/dL (ref 6.5–8.1)
Total Bilirubin: 0.6 mg/dL (ref 0.3–1.2)

## 2018-02-05 ENCOUNTER — Ambulatory Visit (INDEPENDENT_AMBULATORY_CARE_PROVIDER_SITE_OTHER): Payer: Medicare Other | Admitting: Otolaryngology

## 2018-02-07 ENCOUNTER — Other Ambulatory Visit: Payer: Self-pay

## 2018-02-07 ENCOUNTER — Encounter (HOSPITAL_COMMUNITY): Payer: Self-pay | Admitting: Internal Medicine

## 2018-02-07 ENCOUNTER — Inpatient Hospital Stay (HOSPITAL_BASED_OUTPATIENT_CLINIC_OR_DEPARTMENT_OTHER): Payer: Medicare Other | Admitting: Internal Medicine

## 2018-02-07 VITALS — BP 125/60 | HR 82 | Temp 97.6°F | Resp 18 | Wt 260.0 lb

## 2018-02-07 DIAGNOSIS — Z79899 Other long term (current) drug therapy: Secondary | ICD-10-CM

## 2018-02-07 DIAGNOSIS — F329 Major depressive disorder, single episode, unspecified: Secondary | ICD-10-CM

## 2018-02-07 DIAGNOSIS — N390 Urinary tract infection, site not specified: Secondary | ICD-10-CM

## 2018-02-07 DIAGNOSIS — E039 Hypothyroidism, unspecified: Secondary | ICD-10-CM | POA: Diagnosis not present

## 2018-02-07 DIAGNOSIS — K219 Gastro-esophageal reflux disease without esophagitis: Secondary | ICD-10-CM | POA: Diagnosis not present

## 2018-02-07 DIAGNOSIS — Z87891 Personal history of nicotine dependence: Secondary | ICD-10-CM | POA: Diagnosis not present

## 2018-02-07 DIAGNOSIS — Z7901 Long term (current) use of anticoagulants: Secondary | ICD-10-CM | POA: Diagnosis not present

## 2018-02-07 DIAGNOSIS — R001 Bradycardia, unspecified: Secondary | ICD-10-CM | POA: Diagnosis not present

## 2018-02-07 DIAGNOSIS — D696 Thrombocytopenia, unspecified: Secondary | ICD-10-CM

## 2018-02-07 DIAGNOSIS — E538 Deficiency of other specified B group vitamins: Secondary | ICD-10-CM | POA: Diagnosis not present

## 2018-02-07 DIAGNOSIS — E785 Hyperlipidemia, unspecified: Secondary | ICD-10-CM

## 2018-02-07 DIAGNOSIS — R011 Cardiac murmur, unspecified: Secondary | ICD-10-CM

## 2018-02-07 DIAGNOSIS — I4891 Unspecified atrial fibrillation: Secondary | ICD-10-CM | POA: Diagnosis not present

## 2018-02-07 DIAGNOSIS — E119 Type 2 diabetes mellitus without complications: Secondary | ICD-10-CM

## 2018-02-07 DIAGNOSIS — D538 Other specified nutritional anemias: Secondary | ICD-10-CM

## 2018-02-07 DIAGNOSIS — I700 Atherosclerosis of aorta: Secondary | ICD-10-CM

## 2018-02-07 DIAGNOSIS — I110 Hypertensive heart disease with heart failure: Secondary | ICD-10-CM | POA: Diagnosis not present

## 2018-02-07 LAB — CUP PACEART REMOTE DEVICE CHECK
Battery Remaining Longevity: 117 mo
Brady Statistic AP VP Percent: 3.31 %
Brady Statistic AP VS Percent: 0 %
Brady Statistic AS VP Percent: 96.49 %
Brady Statistic RA Percent Paced: 2.6 %
Brady Statistic RV Percent Paced: 99.77 %
Date Time Interrogation Session: 20191120112843
Implantable Lead Implant Date: 20180521
Implantable Lead Implant Date: 20180521
Implantable Lead Location: 753859
Implantable Lead Location: 753859
Implantable Lead Model: 3830
Implantable Lead Model: 5076
Implantable Pulse Generator Implant Date: 20180521
Lead Channel Impedance Value: 304 Ohm
Lead Channel Impedance Value: 342 Ohm
Lead Channel Impedance Value: 437 Ohm
Lead Channel Impedance Value: 456 Ohm
Lead Channel Pacing Threshold Amplitude: 0.875 V
Lead Channel Pacing Threshold Pulse Width: 0.4 ms
Lead Channel Pacing Threshold Pulse Width: 0.4 ms
Lead Channel Sensing Intrinsic Amplitude: 1.625 mV
Lead Channel Sensing Intrinsic Amplitude: 1.625 mV
Lead Channel Sensing Intrinsic Amplitude: 19.625 mV
Lead Channel Sensing Intrinsic Amplitude: 19.625 mV
Lead Channel Setting Pacing Amplitude: 1.75 V
Lead Channel Setting Pacing Amplitude: 2.5 V
Lead Channel Setting Pacing Pulse Width: 0.4 ms
MDC IDC MSMT BATTERY VOLTAGE: 3.02 V
MDC IDC MSMT LEADCHNL RA PACING THRESHOLD AMPLITUDE: 0.875 V
MDC IDC SET LEADCHNL RV SENSING SENSITIVITY: 2 mV
MDC IDC STAT BRADY AS VS PERCENT: 0.07 %

## 2018-02-07 NOTE — Progress Notes (Signed)
Diagnosis Thrombocytopenia ([name]) - Plan: CBC with Differential/Platelet, Comprehensive metabolic panel, Lactate dehydrogenase  Staging Cancer Staging No matching staging information was found for the patient.  Assessment and Plan:   1.  Thrombocytopenia.  Labs performed  08/16/2017 showed a white count of 7.9 hemoglobin 12.3 platelets 78,000.  Peripheral smear shows no fragmentation.  He has a normal differential.  Path review shows thrombocytopenia.  Chemistries normal with a creatinine of 1.17.  LDH was 133 liver function tests were normal.  The patient has previously undergone HIV and hepatitis C testing that was negative.  He has normal PT, PTT, hepatitis A and B serologies.    CT of the chest, abdomen and pelvis done 08/30/2017 showed IMPRESSION: Chest Impression:  1. Post CABG. 2. No acute pulmonary findings.  Abdomen / Pelvis Impression:  1. Normal spleen.  No lymphadenopathy. 2. Normal kidneys. 3.  Aortic Atherosclerosis (ICD10-I70.0).  Review of record shows he had thrombocytopenia dating back to 04/2017.  At that time he was diagnosed with Serratia UTI.  He denied being treated for UTI.  I have discussed with him infection could play a role in thrombocytopenia.  He was placed on Bactrim DS 1 tab bid for 10 days.    Labs done 10/30/2017 showed plts 94,000 which was improved from labs done in 07/2017.    Labs done 01/31/2018 reviewed and showed WBC 7.4 HB 12.8 plts 83.000.  Plt count slightly decreased from 94,000 on labs done 10/2017.  Chemistries WNL with K+ 4.7 and Cr 1.36.  Pt reports he was diagnosed again last week with UTI and is on abx.   I discussed with him he will continue to have labs monitored and if counts drop less than 50,000 he will be set up for bone Marrow aspirate and biopsy for further evaluation. Pt should complete abx as recommended.  Pt will RTC in 04/2018 for follow-up.    2.  UTI.  Review of record shows he had thrombocytopenia dating back to 04/2017.  At  that time he was diagnosed with Serratia UTI.  Pt has completed bactrim.  UA and culture done 09/2017 showed mixed flora.  He reports he was again diagnosed with UTI and is on abx.  He should follow-up with PCP and urology as recommended.    3.  Joint pain.  He reports this is primarily in his hip.  SPEP, C-reactive protein, sed rate, rheumatoid factor  were negative.  Follow-up with PCP for ongoing management.  4.  Smoking/SOB.  Patient had a prior history of smoking.  CT of the chest done 08/30/2017 was negative.     5.  Hypertension.  Blood pressure is 125/60.  Follow-up with PCP.  Interval history:  Historical data obtained from the note dated 08/16/2017.  73 year old male who was referred for evaluation of thrombocytopenia.  He denies any bruising or bleeding.  He has never been told he had problems with his liver.  He is a former smoker.  He reports he has problems with persistent UTIs.  He has also lost 12 pounds.  Review of chart shows patient had labs done July 11, 2017 that showed a white count 7.6 hemoglobin 13.1 platelets were 94,000.  He had a normal differential.  He had repeat labs done on 17 2019 that showed a white count of 7 hemoglobin 12.6 platelets 77,000.  He had a normal differential.  Chemistries showed a creatinine of 1.18, liver function tests were normal.  He denies any fevers, chills, night sweats, and  has noted no adenopathy.  He has problems with hip pain which he reports he has been told is due to arthritis.  He denies any family history of leukemias or lymphomas.    Current Status:  Pt is here today for follow-up.  He is here to go over labs.  He reports he was diagnosed with UTI and is on abx.    Problem List Patient Active Problem List   Diagnosis Date Noted  . Diabetic neuropathy ([place]) [E11.40] 01/30/2018  . S/P AVR (aortic valve replacement) [Z95.2] 11/04/2016  . Atrial fibrillation ([place]) [I48.91] 09/27/2016  . Symptomatic bradycardia [R00.1] 06/12/2016  . 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 Groin pain [R10.30]   . Hypothyroidism [E03.9] 03/25/2015  . 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 Diastolic CHF ([place]) [B28.41] 01/01/2015  . B12 deficiency [E53.8] 04/08/2014  . Lateral meniscal tear [S83.289A]   . DM type 2, not at goal [facility]) [E11.9] 03/12/2014  . 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 GERD (gastroesophageal reflux disease) [K21.9] 11/02/2012  . Anemia [D64.9] 04/02/2012  . Obesity [E66.9] 08/04/2010  . Benign prostatic hyperplasia [N40.0] 04/15/2010  . CAROTID OCCLUSIVE DISEASE [I65.29] 01/26/2010  . MURMUR [R01.1] 05/27/2009  . Aortic valve disease [I35.9] 05/27/2009  . Hyperlipidemia with target LDL less than 100 [E78.5] 01/22/2009  . Depression [F32.9] 01/22/2009  . Essential hypertension [I10] 01/22/2009    Past Medical History Past Medical History:  Diagnosis Date  . Arthritis   . Atrial fibrillation ([place])   . BPH (benign prostatic hypertrophy) 04/15/2010  . Cataract   . Colon polyps   . DEPRESSION 01/22/2009  . Heart valve replaced   . HYPERCHOLESTEROLEMIA 01/22/2009  . HYPERTENSION 01/22/2009   Dr. [name]  . 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 Morbid obesity ([place])   . Tremors of nervous system    ?ptsd  . Ulcer     Past Surgical History Past Surgical History:  Procedure Laterality Date  . 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 APPENDECTOMY    . bilateral cataract surg    . COLONOSCOPY  04/24/2007   [name]: normal  . COLONOSCOPY WITH ESOPHAGOGASTRODUODENOSCOPY (EGD) N/A 04/05/2012   Procedure: COLONOSCOPY WITH ESOPHAGOGASTRODUODENOSCOPY (EGD);  Surgeon: Daneil Dolin, MD;  Location: [facility];  Service: Endoscopy;  Laterality: N/A;  10;15  . DENTAL SURGERY  05/2004   Dental extractions  . EYE SURGERY Bilateral    cataract  . HEEL SPUR SURGERY Bilateral    resection of heel spur  . KNEE ARTHROSCOPY WITH LATERAL  MENISECTOMY Right 04/04/2014   Procedure: KNEE ARTHROSCOPY WITH LATERAL MENISECTOMY;  Surgeon: Carole Civil, MD;  Location: [facility] ORS;  Service: Orthopedics;  Laterality: Right;  . PACEMAKER IMPLANT N/A 06/13/2016   Procedure: Pacemaker Implant- Dual Chamber;  Surgeon: Evans Lance, MD;  Location: [facility];  Service: Cardiovascular;  Laterality: N/A;  . PACEMAKER INSERTION  2018  . THYROIDECTOMY  03/21/2013   DR [name]  . THYROIDECTOMY N/A 03/21/2013   Procedure: THYROIDECTOMY;  Surgeon: [name], MD;  Location: [facility];  Service: General;  Laterality: N/A;  . TONSILLECTOMY      Family History Family History  Problem Relation Age of Onset  . Heart disease Father   . Congestive Heart Failure Father   . Arthritis Father   . Diabetes Other   . Benign prostatic hyperplasia Brother      Social History  reports that  he quit smoking about 56 years ago. His smoking use included cigarettes. He smoked 0.00 packs per day for 12.00 years. He has never used smokeless tobacco. He reports that he does not drink alcohol or use drugs.  Medications  Current Outpatient Medications:  .  acetaminophen (TYLENOL) 500 MG tablet, Take 1,000 mg by mouth every 6 (six) hours as needed (pain)., Disp: , Rfl:  .  cephALEXin (KEFLEX) 500 MG capsule, Take 1 capsule (500 mg total) by mouth 4 (four) times daily., Disp: 28 capsule, Rfl: 0 .  cholecalciferol (VITAMIN D) 1000 units tablet, Take 1,000 Units by mouth daily. , Disp: , Rfl:  .  ELIQUIS 5 MG TABS tablet, TAKE ONE TABLET BY MOUTH TWICE DAILY, Disp: 60 tablet, Rfl: 5 .  fluticasone (FLONASE) 50 MCG/ACT nasal spray, Place 2 sprays into both nostrils daily., Disp: 16 g, Rfl: 6 .  furosemide (LASIX) 20 MG tablet, Take 1 tablet (20 mg total) by mouth daily., Disp: 30 tablet, Rfl: 1 .  gabapentin (NEURONTIN) 300 MG capsule, Take 1 capsule (300 mg total) by mouth 2 (two) times daily., Disp: 180 capsule, Rfl: 3 .  glucose blood test strip, 1 strip by  Does not apply route 3 (three) times daily. Uses true metrix strips (diabetic club), Disp: , Rfl: 12 .  insulin detemir (LEVEMIR) 100 UNIT/ML injection, Inject 0.47-0.48 mLs (47-48 Units total) into the skin 2 (two) times daily. (Patient taking differently: Inject 42 Units into the skin 2 (two) times daily. ), Disp: 10 mL, Rfl:  .  Insulin Pen Needle (B-D ULTRAFINE III SHORT PEN) 31G X 8 MM MISC, Use to inject Victoza qd, Disp: 100 each, Rfl: 5 .  levothyroxine (SYNTHROID, LEVOTHROID) 200 MCG tablet, Take 1 tablet (200 mcg total) by mouth daily before breakfast., Disp: 30 tablet, Rfl: 1 .  metFORMIN (GLUCOPHAGE) 1000 MG tablet, TAKE 1 TABLET BY MOUTH TWICE DAILY WITH A MEAL., Disp: 180 tablet, Rfl: 0 .  Multiple Vitamins-Minerals (MENS MULTI VITAMIN & MINERAL PO), Take 1 tablet by mouth daily.  , Disp: , Rfl:  .  Omega-3 Fatty Acids (FISH OIL) 1200 MG CAPS, Take 1,200-2,400 mg by mouth 2 (two) times daily. Take 1 capsule (1200 mg) by mouth daily at noon and 2 capsules (2400 mg) daily at bedtime, Disp: , Rfl:  .  propranolol (INDERAL) 20 MG tablet, Take 20 mg by mouth 2 (two) times daily., Disp: , Rfl:  .  Semaglutide (OZEMPIC) 0.25 or 0.5 MG/DOSE SOPN, Inject into the skin., Disp: , Rfl:  .  simvastatin (ZOCOR) 80 MG tablet, Take 0.5 tablets (40 mg total) by mouth at bedtime., Disp: 15 tablet, Rfl: 0 .  tamsulosin (FLOMAX) 0.4 MG CAPS capsule, TAKE TWO CAPSULES BY MOUTH AT BEDTIME, Disp: 180 capsule, Rfl: 1  Allergies Phenothiazines and Actos [pioglitazone]  Review of Systems Review of Systems - Oncology ROS negative   Physical Exam  Vitals Wt Readings from Last 3 Encounters:  02/07/18 260 lb (117.9 kg)  01/30/18 260 lb 9.6 oz (118.2 kg)  11/29/17 257 lb 9.6 oz (116.8 kg)   Temp Readings from Last 3 Encounters:  02/07/18 97.6 F (36.4 C) (Oral)  01/30/18 (!) 96.9 F (36.1 C) (Oral)  11/20/17 (!) 96.6 F (35.9 C) (Oral)   BP Readings from Last 3 Encounters:  02/07/18 125/60   01/30/18 119/65  11/29/17 107/66   Pulse Readings from Last 3 Encounters:  02/07/18 82  01/30/18 79  11/29/17 86   Constitutional: Well-developed, well-nourished, and in  no distress.   HENT: Head: Normocephalic and atraumatic.  Mouth/Throat: No oropharyngeal exudate. Mucosa moist. Eyes: Pupils are equal, round, and reactive to light. Conjunctivae are normal. No scleral icterus.  Neck: Normal range of motion. Neck supple. No JVD present.  Cardiovascular: Normal rate, regular rhythm and normal heart sounds.  Exam reveals no gallop and no friction rub.   No murmur heard. Pulmonary/Chest: Effort normal and breath sounds normal. No respiratory distress. No wheezes.No rales.  Abdominal: Soft. Bowel sounds are normal. No distension. There is no tenderness. There is no guarding.  Musculoskeletal: No edema or tenderness.  Lymphadenopathy: No cervical, axillary or supraclavicular adenopathy.  Neurological: Alert and oriented to person, place, and time. No cranial nerve deficit.  Skin: Skin is warm and dry. No rash noted. No erythema. No pallor.  Psychiatric: Affect and judgment normal.   Labs No visits with results within 3 Day(s) from this visit.  Latest known visit with results is:  Appointment on 01/31/2018  Component Date Value Ref Range Status  . WBC 01/31/2018 7.4  4.0 - 10.5 K/uL Final  . RBC 01/31/2018 3.90* 4.22 - 5.81 MIL/uL Final  . Hemoglobin 01/31/2018 12.8* 13.0 - 17.0 g/dL Final  . HCT 01/31/2018 39.1  39.0 - 52.0 % Final  . MCV 01/31/2018 100.3* 80.0 - 100.0 fL Final  . MCH 01/31/2018 32.8  26.0 - 34.0 pg Final  . MCHC 01/31/2018 32.7  30.0 - 36.0 g/dL Final  . RDW 01/31/2018 14.5  11.5 - 15.5 % Final  . Platelets 01/31/2018 83* 150 - 400 K/uL Final   Comment: PLATELET COUNT CONFIRMED BY SMEAR SPECIMEN CHECKED FOR CLOTS Immature Platelet Fraction may be clinically indicated, consider ordering this additional test [id number]   . nRBC 01/31/2018 0.0  0.0 - 0.2 % Final   . Neutrophils Relative % 01/31/2018 61  % Final  . Neutro Abs 01/31/2018 4.5  1.7 - 7.7 K/uL Final  . Lymphocytes Relative 01/31/2018 24  % Final  . Lymphs Abs 01/31/2018 1.8  0.7 - 4.0 K/uL Final  . Monocytes Relative 01/31/2018 9  % Final  . Monocytes Absolute 01/31/2018 0.7  0.1 - 1.0 K/uL Final  . Eosinophils Relative 01/31/2018 5  % Final  . Eosinophils Absolute 01/31/2018 0.4  0.0 - 0.5 K/uL Final  . Basophils Relative 01/31/2018 1  % Final  . Basophils Absolute 01/31/2018 0.1  0.0 - 0.1 K/uL Final  . Immature Granulocytes 01/31/2018 0  % Final  . Abs Immature Granulocytes 01/31/2018 0.03  0.00 - 0.07 K/uL Final   Performed at [facility], [address], [place], [place] [address]  . Sodium 01/31/2018 136  135 - 145 mmol/L Final  . Potassium 01/31/2018 4.5  3.5 - 5.1 mmol/L Final  . Chloride 01/31/2018 103  98 - 111 mmol/L Final  . CO2 01/31/2018 26  22 - 32 mmol/L Final  . Glucose, Bld 01/31/2018 269* 70 - 99 mg/dL Final  . BUN 01/31/2018 21  8 - 23 mg/dL Final  . Creatinine, Ser 01/31/2018 1.22  0.61 - 1.24 mg/dL Final  . Calcium 01/31/2018 8.7* 8.9 - 10.3 mg/dL Final  . Total Protein 01/31/2018 6.7  6.5 - 8.1 g/dL Final  . Albumin 01/31/2018 3.6  3.5 - 5.0 g/dL Final  . AST 01/31/2018 25  15 - 41 U/L Final  . ALT 01/31/2018 22  0 - 44 U/L Final  . Alkaline Phosphatase 01/31/2018 48  38 - 126 U/L Final  . Total Bilirubin 01/31/2018 0.6  0.3 - 1.2 mg/dL Final  . GFR calc non Af Amer 01/31/2018 58* >60 mL/min Final  . GFR calc Af Amer 01/31/2018 >60  >60 mL/min Final  . Anion gap 01/31/2018 7  5 - 15 Final   Performed at [facility], [address] [place], [place] [address]  . LDH 01/31/2018 157  98 - 192 U/L Final   Performed at [facility], [address] [place], [place] [address]     Pathology Orders Placed This Encounter  Procedures  . CBC with Differential/Platelet    Standing Status:   Future    Standing Expiration Date:   02/08/2019  . Comprehensive  metabolic panel    Standing Status:   Future    Standing Expiration Date:   02/08/2019  . Lactate dehydrogenase    Standing Status:   Future    Standing Expiration Date:   02/08/2019       Zoila Shutter MD

## 2018-02-26 ENCOUNTER — Ambulatory Visit (INDEPENDENT_AMBULATORY_CARE_PROVIDER_SITE_OTHER): Payer: Medicare Other | Admitting: Otolaryngology

## 2018-02-26 DIAGNOSIS — R490 Dysphonia: Secondary | ICD-10-CM | POA: Diagnosis not present

## 2018-02-26 DIAGNOSIS — R1312 Dysphagia, oropharyngeal phase: Secondary | ICD-10-CM

## 2018-03-02 DIAGNOSIS — M25572 Pain in left ankle and joints of left foot: Secondary | ICD-10-CM | POA: Diagnosis not present

## 2018-03-02 DIAGNOSIS — M25562 Pain in left knee: Secondary | ICD-10-CM | POA: Diagnosis not present

## 2018-03-13 ENCOUNTER — Encounter: Payer: Self-pay | Admitting: Nurse Practitioner

## 2018-03-13 ENCOUNTER — Ambulatory Visit (INDEPENDENT_AMBULATORY_CARE_PROVIDER_SITE_OTHER): Payer: Medicare Other | Admitting: Nurse Practitioner

## 2018-03-13 VITALS — BP 102/59 | HR 81 | Temp 96.9°F | Ht 76.0 in | Wt 258.0 lb

## 2018-03-13 DIAGNOSIS — R300 Dysuria: Secondary | ICD-10-CM | POA: Diagnosis not present

## 2018-03-13 DIAGNOSIS — N3000 Acute cystitis without hematuria: Secondary | ICD-10-CM

## 2018-03-13 LAB — URINALYSIS, COMPLETE
Bilirubin, UA: NEGATIVE
Glucose, UA: NEGATIVE
Ketones, UA: NEGATIVE
Nitrite, UA: NEGATIVE
Protein, UA: NEGATIVE
Specific Gravity, UA: 1.015 (ref 1.005–1.030)
Urobilinogen, Ur: 0.2 mg/dL (ref 0.2–1.0)
pH, UA: 7.5 (ref 5.0–7.5)

## 2018-03-13 LAB — MICROSCOPIC EXAMINATION
Bacteria, UA: NONE SEEN
WBC, UA: >30 /hpf — AB (ref 0–5)

## 2018-03-13 MED ORDER — CIPROFLOXACIN HCL 500 MG PO TABS: 500.0000 mg | ORAL_TABLET | Freq: Two times a day (BID) | ORAL | 0 refills | Status: DC

## 2018-03-13 NOTE — Patient Instructions (Signed)
Urinary Tract Infection, Adult A urinary tract infection (UTI) is an infection of any part of the urinary tract. The urinary tract includes:  The kidneys.  The ureters.  The bladder.  The urethra. These organs make, store, and get rid of pee (urine) in the body. What are the causes? This is caused by germs (bacteria) in your genital area. These germs grow and cause swelling (inflammation) of your urinary tract. What increases the risk? You are more likely to develop this condition if:  You have a small, thin tube (catheter) to drain pee.  You cannot control when you pee or poop (incontinence).  You are male, and: ? You use these methods to prevent pregnancy: ? A medicine that kills sperm (spermicide). ? A device that blocks sperm (diaphragm). ? You have low levels of a male hormone (estrogen). ? You are pregnant.  You have genes that add to your risk.  You are sexually active.  You take antibiotic medicines.  You have trouble peeing because of: ? A prostate that is bigger than normal, if you are male. ? A blockage in the part of your body that drains pee from the bladder (urethra). ? A kidney stone. ? A nerve condition that affects your bladder (neurogenic bladder). ? Not getting enough to drink. ? Not peeing often enough.  You have other conditions, such as: ? Diabetes. ? A weak disease-fighting system (immune system). ? Sickle cell disease. ? Gout. ? Injury of the spine. What are the signs or symptoms? Symptoms of this condition include:  Needing to pee right away (urgently).  Peeing often.  Peeing small amounts often.  Pain or burning when peeing.  Blood in the pee.  Pee that smells bad or not like normal.  Trouble peeing.  Pee that is cloudy.  Fluid coming from the vagina, if you are male.  Pain in the belly or lower back. Other symptoms include:  Throwing up (vomiting).  No urge to eat.  Feeling mixed up (confused).  Being tired  and grouchy (irritable).  A fever.  Watery poop (diarrhea). How is this treated? This condition may be treated with:  Antibiotic medicine.  Other medicines.  Drinking enough water. Follow these instructions at home:  Medicines  Take over-the-counter and prescription medicines only as told by your doctor.  If you were prescribed an antibiotic medicine, take it as told by your doctor. Do not stop taking it even if you start to feel better. General instructions  Make sure you: ? Pee until your bladder is empty. ? Do not hold pee for a long time. ? Empty your bladder after sex. ? Wipe from front to back after pooping if you are a male. Use each tissue one time when you wipe.  Drink enough fluid to keep your pee pale yellow.  Keep all follow-up visits as told by your doctor. This is important. Contact a doctor if:  You do not get better after 1-2 days.  Your symptoms go away and then come back. Get help right away if:  You have very bad back pain.  You have very bad pain in your lower belly.  You have a fever.  You are sick to your stomach (nauseous).  You are throwing up. Summary  A urinary tract infection (UTI) is an infection of any part of the urinary tract.  This condition is caused by germs in your genital area.  There are many risk factors for a UTI. These include having a small, thin   tube to drain pee and not being able to control when you pee or poop.  Treatment includes antibiotic medicines for germs.  Drink enough fluid to keep your pee pale yellow. This information is not intended to replace advice given to you by your health care provider. Make sure you discuss any questions you have with your health care provider. Document Released: 06/29/2007 Document Revised: 07/20/2017 Document Reviewed: 07/20/2017 Elsevier Interactive Patient Education  2019 [employer]  

## 2018-03-13 NOTE — Progress Notes (Signed)
   Subjective:    Patient ID: [name], male    DOB: 10/16/1945, 73 y.o.   MRN: [id number]   Chief Complaint: Wants urine checked   HPI Patient comesin saying that he wants his urine checked. Last couple of days he has had some low back pain. He denies urgency, frequency.  and dysuria. Deneis any back pain today.    Review of Systems  Constitutional: Negative for chills and fever.  HENT: Negative.   Respiratory: Negative.   Cardiovascular: Negative.   Gastrointestinal: Negative.   Genitourinary: Negative for dysuria, frequency and urgency.  Musculoskeletal: Positive for back pain (has resolved).  Neurological: Negative.   Psychiatric/Behavioral: Negative.   All other systems reviewed and are negative.      Objective:   Physical Exam Constitutional:      General: He is not in acute distress.    Appearance: He is normal weight.  Cardiovascular:     Rate and Rhythm: Normal rate and regular rhythm.     Heart sounds: Normal heart sounds.  Pulmonary:     Breath sounds: Normal breath sounds.  Abdominal:     General: Abdomen is flat. Bowel sounds are normal.     Palpations: Abdomen is soft.  Musculoskeletal:     Comments: From of lumbar spine without pain. (-) SLR bil Motor strength and sensation distally intact  Skin:    General: Skin is warm and dry.  Neurological:     General: No focal deficit present.     Mental Status: He is alert and oriented to person, place, and time.  Psychiatric:        Mood and Affect: Mood normal.        Behavior: Behavior normal.    BP (!) 102/59   Pulse 81   Temp (!) 96.9 F (36.1 C) (Oral)   Ht 6\' 4"  (1.93 m)   Wt 258 lb (117 kg)   BMI 31.40 kg/m   UA 3+ leuks      Assessment & Plan:  [name] in today with chief complaint of Wants urine checked   1. Dysuria - Urinalysis, Complete  2. Acute cystitis without hematuria Take medication as prescribe Cotton underwear Take shower not bath Cranberry juice,  yogurt Force fluids AZO over the counter X2 days Culture pending RTO prn  - ciprofloxacin (CIPRO) 500 MG tablet; Take 1 tablet (500 mg total) by mouth 2 (two) times daily.  Dispense: 10 tablet; Refill: 0 - Urine Culture   Mary-Margaret Hassell Done, FNP

## 2018-03-14 ENCOUNTER — Ambulatory Visit (INDEPENDENT_AMBULATORY_CARE_PROVIDER_SITE_OTHER): Payer: Medicare Other

## 2018-03-14 ENCOUNTER — Telehealth: Payer: Self-pay | Admitting: Internal Medicine

## 2018-03-14 DIAGNOSIS — I442 Atrioventricular block, complete: Secondary | ICD-10-CM

## 2018-03-14 NOTE — Telephone Encounter (Signed)
New Message    1. Has your device fired? No  2. Is you device beeping? Yes  3. Are you experiencing draining or swelling at device site? No  4. Are you calling to see if we received your device transmission? No  5. Have you passed out? No   PT says he was suppose to have a transmission this morning and he says when he picked it up and looked at it the battery light was on.    Please route to [place]

## 2018-03-15 LAB — URINE CULTURE

## 2018-03-15 NOTE — Telephone Encounter (Signed)
Spoke w/ pt and informed him that his home monitor is automatic and that his remote transmission is received.

## 2018-03-16 LAB — CUP PACEART REMOTE DEVICE CHECK
Battery Remaining Longevity: 115 mo
Brady Statistic AP VP Percent: 2.25 %
Brady Statistic AP VS Percent: 0 %
Brady Statistic AS VP Percent: 97.4 %
Brady Statistic AS VS Percent: 0.35 %
Brady Statistic RA Percent Paced: 2.47 %
Brady Statistic RV Percent Paced: 99.65 %
Date Time Interrogation Session: 20200220112100
Implantable Lead Implant Date: 20180521
Implantable Lead Location: 753859
Implantable Lead Location: 753859
Implantable Lead Model: 3830
Implantable Lead Model: 5076
Implantable Pulse Generator Implant Date: 20180521
Lead Channel Impedance Value: 323 Ohm
Lead Channel Impedance Value: 361 Ohm
Lead Channel Impedance Value: 399 Ohm
Lead Channel Impedance Value: 456 Ohm
Lead Channel Pacing Threshold Amplitude: 0.875 V
Lead Channel Pacing Threshold Pulse Width: 0.4 ms
Lead Channel Sensing Intrinsic Amplitude: 1.75 mV
Lead Channel Sensing Intrinsic Amplitude: 1.75 mV
Lead Channel Sensing Intrinsic Amplitude: 19.625 mV
Lead Channel Sensing Intrinsic Amplitude: 19.625 mV
Lead Channel Setting Pacing Amplitude: 2.5 V
Lead Channel Setting Pacing Pulse Width: 0.4 ms
Lead Channel Setting Sensing Sensitivity: 2 mV
MDC IDC LEAD IMPLANT DT: 20180521
MDC IDC MSMT BATTERY VOLTAGE: 3.01 V
MDC IDC MSMT LEADCHNL RA PACING THRESHOLD AMPLITUDE: 0.75 V
MDC IDC MSMT LEADCHNL RA PACING THRESHOLD PULSEWIDTH: 0.4 ms
MDC IDC SET LEADCHNL RA PACING AMPLITUDE: 1.75 V

## 2018-03-19 ENCOUNTER — Telehealth: Payer: Self-pay | Admitting: Family Medicine

## 2018-03-19 NOTE — Telephone Encounter (Signed)
Pt needs form filled out Pt will drop off form

## 2018-03-21 ENCOUNTER — Encounter: Payer: Self-pay | Admitting: Family Medicine

## 2018-03-21 ENCOUNTER — Ambulatory Visit (INDEPENDENT_AMBULATORY_CARE_PROVIDER_SITE_OTHER): Payer: Medicare Other | Admitting: Family Medicine

## 2018-03-21 VITALS — BP 119/74 | HR 80 | Temp 96.9°F | Ht 76.0 in | Wt 260.4 lb

## 2018-03-21 DIAGNOSIS — R300 Dysuria: Secondary | ICD-10-CM

## 2018-03-21 DIAGNOSIS — N410 Acute prostatitis: Secondary | ICD-10-CM | POA: Diagnosis not present

## 2018-03-21 LAB — URINALYSIS
Bilirubin, UA: NEGATIVE
GLUCOSE, UA: NEGATIVE
Ketones, UA: NEGATIVE
Nitrite, UA: NEGATIVE
Protein, UA: NEGATIVE
Specific Gravity, UA: 1.015 (ref 1.005–1.030)
Urobilinogen, Ur: 0.2 mg/dL (ref 0.2–1.0)
pH, UA: 6.5 (ref 5.0–7.5)

## 2018-03-21 MED ORDER — CIPROFLOXACIN HCL 500 MG PO TABS: 500.0000 mg | ORAL_TABLET | Freq: Two times a day (BID) | ORAL | 0 refills | Status: DC

## 2018-03-21 NOTE — Progress Notes (Signed)
Chief Complaint  Patient presents with  . Urinary Tract Infection    HPI  Patient presents today for continued urinary frequency.  He came in on 18 February and was treated for UTI using a 1 week course of ciprofloxacin.  Urine culture was negative.  Currently he says that his blood sugars been running 123-180.  He has mild bilateral flank pain and suprapubic pain but no low back pain.  No nausea vomiting or diarrhea.  He denies fever chills and sweats as well.  PMH: Smoking status noted ROS: Per HPI  Objective: BP 119/74   Pulse 80   Temp (!) 96.9 F (36.1 C) (Oral)   Ht 6\' 4"  (1.93 m)   Wt 260 lb 6 oz (118.1 kg)   BMI 31.69 kg/m  Gen: NAD, alert, cooperative with exam HEENT: NCAT, EOMI, PERRL CV: RRR, good S1/S2, no murmur Resp: CTABL, no wheezes, non-labored Abd: SNTND, BS present, no guarding or organomegaly Ext: No edema, warm Neuro: Alert and oriented, No gross deficits  Assessment and plan:  1. Acute prostatitis   2. Dysuria     Meds ordered this encounter  Medications  . ciprofloxacin (CIPRO) 500 MG tablet    Sig: Take 1 tablet (500 mg total) by mouth 2 (two) times daily. For prostate. Take all of these.    Dispense:  30 tablet    Refill:  0    Orders Placed This Encounter  Procedures  . Urine Culture  . Urinalysis    Follow up as needed.  Claretta Fraise, MD

## 2018-03-22 LAB — URINE CULTURE

## 2018-03-22 NOTE — Progress Notes (Signed)
Remote pacemaker transmission.   

## 2018-03-23 ENCOUNTER — Encounter: Payer: Self-pay | Admitting: Cardiology

## 2018-03-24 ENCOUNTER — Telehealth: Payer: Self-pay | Admitting: Family Medicine

## 2018-03-26 NOTE — Telephone Encounter (Signed)
NA, let phone ring 12 times

## 2018-03-28 DIAGNOSIS — E119 Type 2 diabetes mellitus without complications: Secondary | ICD-10-CM | POA: Diagnosis not present

## 2018-03-28 DIAGNOSIS — Z794 Long term (current) use of insulin: Secondary | ICD-10-CM | POA: Diagnosis not present

## 2018-03-28 DIAGNOSIS — R42 Dizziness and giddiness: Secondary | ICD-10-CM | POA: Diagnosis not present

## 2018-04-02 DIAGNOSIS — H4312 Vitreous hemorrhage, left eye: Secondary | ICD-10-CM | POA: Diagnosis not present

## 2018-04-02 DIAGNOSIS — E113293 Type 2 diabetes mellitus with mild nonproliferative diabetic retinopathy without macular edema, bilateral: Secondary | ICD-10-CM | POA: Diagnosis not present

## 2018-04-05 DIAGNOSIS — E113592 Type 2 diabetes mellitus with proliferative diabetic retinopathy without macular edema, left eye: Secondary | ICD-10-CM | POA: Diagnosis not present

## 2018-04-05 DIAGNOSIS — H4312 Vitreous hemorrhage, left eye: Secondary | ICD-10-CM | POA: Diagnosis not present

## 2018-04-05 DIAGNOSIS — H43811 Vitreous degeneration, right eye: Secondary | ICD-10-CM | POA: Diagnosis not present

## 2018-04-05 DIAGNOSIS — E113493 Type 2 diabetes mellitus with severe nonproliferative diabetic retinopathy without macular edema, bilateral: Secondary | ICD-10-CM | POA: Diagnosis not present

## 2018-04-06 ENCOUNTER — Ambulatory Visit: Payer: Medicare Other | Admitting: Family Medicine

## 2018-04-16 ENCOUNTER — Ambulatory Visit (INDEPENDENT_AMBULATORY_CARE_PROVIDER_SITE_OTHER): Payer: Medicare Other | Admitting: Family Medicine

## 2018-04-16 ENCOUNTER — Encounter: Payer: Self-pay | Admitting: Family Medicine

## 2018-04-16 ENCOUNTER — Other Ambulatory Visit: Payer: Self-pay

## 2018-04-16 VITALS — BP 156/80 | HR 80 | Temp 97.4°F | Ht 76.0 in | Wt 264.2 lb

## 2018-04-16 DIAGNOSIS — R300 Dysuria: Secondary | ICD-10-CM | POA: Diagnosis not present

## 2018-04-16 DIAGNOSIS — N3000 Acute cystitis without hematuria: Secondary | ICD-10-CM

## 2018-04-16 DIAGNOSIS — R42 Dizziness and giddiness: Secondary | ICD-10-CM | POA: Diagnosis not present

## 2018-04-16 LAB — MICROSCOPIC EXAMINATION

## 2018-04-16 LAB — URINALYSIS, COMPLETE
Bilirubin, UA: NEGATIVE
KETONES UA: NEGATIVE
Nitrite, UA: NEGATIVE
Protein, UA: NEGATIVE
SPEC GRAV UA: 1.015 (ref 1.005–1.030)
Urobilinogen, Ur: 0.2 mg/dL (ref 0.2–1.0)
pH, UA: 7 (ref 5.0–7.5)

## 2018-04-16 MED ORDER — SULFAMETHOXAZOLE-TRIMETHOPRIM 800-160 MG PO TABS: 1.0000 | ORAL_TABLET | Freq: Two times a day (BID) | ORAL | 0 refills | Status: DC

## 2018-04-16 NOTE — Progress Notes (Signed)
BP (!) 156/80   Pulse 80   Temp (!) 97.4 F (36.3 C) (Oral)   Ht 6\' 4"  (1.93 m)   Wt 264 lb 3.2 oz (119.8 kg)   BMI 32.16 kg/m    Subjective:   Patient ID: [name], male    DOB: 07/05/1945, 73 y.o.   MRN: [id number]  HPI: [name] is a 73 y.o. male presenting on 04/16/2018 for Dizziness (Patient states it started this morning) and Abdominal Pain   HPI Dizziness and dysuria and lower abdominal pain Patient comes in complaining of dizziness and dysuria and lower abdominal pain that started just this morning.  He says he just feels kind of foggy and not himself in his head and then he had this lower abdominal pain and urinary frequency and urinary burning that all seem to have started this morning.  He denies any fevers or chills or cough or congestion or wheezing.  Relevant past medical, surgical, family and social history reviewed and updated as indicated. Interim medical history since our last visit reviewed. Allergies and medications reviewed and updated.  Review of Systems  Constitutional: Negative for chills and fever.  Respiratory: Negative for shortness of breath and wheezing.   Cardiovascular: Negative for chest pain and leg swelling.  Gastrointestinal: Positive for abdominal pain. Negative for constipation, diarrhea, nausea and vomiting.  Genitourinary: Positive for dysuria, hematuria and urgency.  Musculoskeletal: Negative for back pain and gait problem.  Skin: Negative for rash.  Neurological: Positive for dizziness. Negative for weakness, light-headedness and numbness.  All other systems reviewed and are negative.   Per HPI unless specifically indicated above      Objective:   BP (!) 156/80   Pulse 80   Temp (!) 97.4 F (36.3 C) (Oral)   Ht 6\' 4"  (1.93 m)   Wt 264 lb 3.2 oz (119.8 kg)   BMI 32.16 kg/m   Wt Readings from Last 3 Encounters:  04/16/18 264 lb 3.2 oz (119.8 kg)  03/21/18 260 lb 6 oz (118.1 kg)  03/13/18 258 lb (117 kg)     Physical Exam Vitals signs and nursing note reviewed.  Constitutional:      General: He is not in acute distress.    Appearance: He is well-developed. He is not diaphoretic.  Eyes:     General: No scleral icterus. Neck:     Thyroid: No thyromegaly.  Cardiovascular:     Rate and Rhythm: Normal rate and regular rhythm.     Heart sounds: Normal heart sounds.  Pulmonary:     Effort: Pulmonary effort is normal. No respiratory distress.     Breath sounds: Normal breath sounds. No wheezing.  Abdominal:     General: Abdomen is flat. Bowel sounds are normal. There is no distension.     Tenderness: There is no abdominal tenderness. There is no right CVA tenderness, left CVA tenderness, guarding or rebound.  Neurological:     Mental Status: He is alert.     Coordination: Coordination normal.     Gait: Gait normal.     Urinalysis: 11-30 WBCs, 3-10 RBCs, 0-10 epithelial cells, few bacteria, trace blood, 2+ leukocytes and trace glucose  Assessment & Plan:   Problem List Items Addressed This Visit    None    Visit Diagnoses    Acute cystitis without hematuria    -  Primary   Relevant Orders   Urinalysis, Complete   Urine Culture   Dizziness  Dizziness are likely related to UTI, his blood sugars were normal and his blood pressure appears to be fine.  Will treat UTI and see if it persists Follow up plan: Return if symptoms worsen or fail to improve.  Counseling provided for all of the vaccine components Orders Placed This Encounter  Procedures  . Urine Culture  . Urinalysis, Complete    Caryl Pina, MD [facility] 04/16/2018, 11:24 AM

## 2018-04-17 DIAGNOSIS — E113493 Type 2 diabetes mellitus with severe nonproliferative diabetic retinopathy without macular edema, bilateral: Secondary | ICD-10-CM | POA: Diagnosis not present

## 2018-04-17 DIAGNOSIS — E113592 Type 2 diabetes mellitus with proliferative diabetic retinopathy without macular edema, left eye: Secondary | ICD-10-CM | POA: Diagnosis not present

## 2018-04-17 DIAGNOSIS — H4312 Vitreous hemorrhage, left eye: Secondary | ICD-10-CM | POA: Diagnosis not present

## 2018-04-17 LAB — URINE CULTURE

## 2018-04-25 ENCOUNTER — Ambulatory Visit: Payer: Medicare Other | Admitting: Family Medicine

## 2018-04-30 ENCOUNTER — Other Ambulatory Visit: Payer: Self-pay

## 2018-04-30 ENCOUNTER — Encounter: Payer: Self-pay | Admitting: Family Medicine

## 2018-04-30 ENCOUNTER — Ambulatory Visit (INDEPENDENT_AMBULATORY_CARE_PROVIDER_SITE_OTHER): Payer: Medicare Other | Admitting: Family Medicine

## 2018-04-30 DIAGNOSIS — E785 Hyperlipidemia, unspecified: Secondary | ICD-10-CM | POA: Diagnosis not present

## 2018-04-30 DIAGNOSIS — E1142 Type 2 diabetes mellitus with diabetic polyneuropathy: Secondary | ICD-10-CM | POA: Diagnosis not present

## 2018-04-30 DIAGNOSIS — K219 Gastro-esophageal reflux disease without esophagitis: Secondary | ICD-10-CM | POA: Diagnosis not present

## 2018-04-30 DIAGNOSIS — E1140 Type 2 diabetes mellitus with diabetic neuropathy, unspecified: Secondary | ICD-10-CM

## 2018-04-30 DIAGNOSIS — E039 Hypothyroidism, unspecified: Secondary | ICD-10-CM

## 2018-04-30 DIAGNOSIS — Z794 Long term (current) use of insulin: Secondary | ICD-10-CM

## 2018-04-30 MED ORDER — SULFAMETHOXAZOLE-TRIMETHOPRIM 800-160 MG PO TABS: 1.0000 | ORAL_TABLET | Freq: Two times a day (BID) | ORAL | 0 refills | Status: DC

## 2018-04-30 NOTE — Progress Notes (Signed)
Virtual Visit via telephone Note  I connected with [name] on 04/30/18 at 0826 by telephone and verified that I am speaking with the correct person using two identifiers. [name] is currently located at home and no other people are currently with her during visit. The provider, Fransisca Kaufmann Elise Gladden, MD is located in their office at time of visit.  Call ended at [phone]  I discussed the limitations, risks, security and privacy concerns of performing an evaluation and management service by telephone and the availability of in person appointments. I also discussed with the patient that there may be a patient responsible charge related to this service. The patient expressed understanding and agreed to proceed.   History and Present Illness: Type 2 diabetes mellitus Patient comes in today for recheck of his diabetes. Patient has been currently taking levemir 38-40 units bid, and ozempic, and metformin and gets it from the [place],  and can run up to 206 in the evening and this am was 137. Patient is not currently on an ACE inhibitor/ARB. Patient has not seen an ophthalmologist this year. Patient says neuropathy is controlled with gabapentin.   GERD Patient is currently on no medication currently.  She denies any major symptoms or abdominal pain or belching or burping. She denies any blood in her stool or lightheadedness or dizziness.   Hyperlipidemia Patient is coming in for recheck of his hyperlipidemia. The patient is currently taking simvastatin. They deny any issues with myalgias or history of liver damage from it. They deny any focal numbness or weakness or chest pain.   Hypothyroidism recheck Patient is coming in for thyroid recheck today as well. They deny any issues with hair changes or heat or cold problems or diarrhea or constipation. They deny any chest pain or palpitations. They are currently on levothyroxine 254micrograms   No diagnosis found.  Outpatient Encounter  Medications as of 04/30/2018  Medication Sig  . acetaminophen (TYLENOL) 500 MG tablet Take 1,000 mg by mouth every 6 (six) hours as needed (pain).  . cholecalciferol (VITAMIN D) 1000 units tablet Take 1,000 Units by mouth daily.   Marland Kitchen ELIQUIS 5 MG TABS tablet TAKE ONE TABLET BY MOUTH TWICE DAILY  . fluticasone (FLONASE) 50 MCG/ACT nasal spray Place 2 sprays into both nostrils daily.  . furosemide (LASIX) 20 MG tablet Take 1 tablet (20 mg total) by mouth daily.  Marland Kitchen gabapentin (NEURONTIN) 300 MG capsule Take 1 capsule (300 mg total) by mouth 2 (two) times daily.  Marland Kitchen glucose blood test strip 1 strip by Does not apply route 3 (three) times daily. Uses true metrix strips (diabetic club)  . insulin detemir (LEVEMIR) 100 UNIT/ML injection Inject 0.47-0.48 mLs (47-48 Units total) into the skin 2 (two) times daily. (Patient taking differently: Inject 42 Units into the skin 2 (two) times daily. )  . Insulin Pen Needle (B-D ULTRAFINE III SHORT PEN) 31G X 8 MM MISC Use to inject Victoza qd  . levothyroxine (SYNTHROID, LEVOTHROID) 200 MCG tablet Take 1 tablet (200 mcg total) by mouth daily before breakfast.  . metFORMIN (GLUCOPHAGE) 1000 MG tablet TAKE 1 TABLET BY MOUTH TWICE DAILY WITH A MEAL.  . Multiple Vitamins-Minerals (MENS MULTI VITAMIN & MINERAL PO) Take 1 tablet by mouth daily.    . Omega-3 Fatty Acids (FISH OIL) 1200 MG CAPS Take 1,200-2,400 mg by mouth 2 (two) times daily. Take 1 capsule (1200 mg) by mouth daily at noon and 2 capsules (2400 mg) daily at bedtime  .  propranolol (INDERAL) 20 MG tablet Take 20 mg by mouth 2 (two) times daily.  . Semaglutide (OZEMPIC) 0.25 or 0.5 MG/DOSE SOPN Inject into the skin.  Marland Kitchen simvastatin (ZOCOR) 80 MG tablet Take 0.5 tablets (40 mg total) by mouth at bedtime.  . sulfamethoxazole-trimethoprim (BACTRIM DS,SEPTRA DS) 800-160 MG tablet Take 1 tablet by mouth 2 (two) times daily.  . tamsulosin (FLOMAX) 0.4 MG CAPS capsule TAKE TWO CAPSULES BY MOUTH AT BEDTIME   No  facility-administered encounter medications on file as of 04/30/2018.     Review of Systems  Constitutional: Negative for chills and fever.  Eyes: Negative for visual disturbance.  Respiratory: Negative for shortness of breath and wheezing.   Cardiovascular: Negative for chest pain and leg swelling.  Genitourinary: Positive for dysuria.  Musculoskeletal: Negative for back pain and gait problem.  Skin: Negative for rash.  Neurological: Negative for dizziness, weakness and light-headedness.  All other systems reviewed and are negative.   Observations/Objective: Patient sounds comfortable on the phone and in no acute distress  Assessment and Plan: Problem List Items Addressed This Visit      Digestive   GERD (gastroesophageal reflux disease)     Endocrine   Hypothyroidism - Primary     Nervous and Auditory   Type 2 diabetes mellitus with diabetic neuropathy, unspecified ([place])   Diabetic neuropathy ([place])     Other   Hyperlipidemia with target LDL less than 100      Patient does have recurrent cystitis/UTIs, and he asked for refill on his Bactrim because he feels like it might be starting him, I sent a refill but if it worsens he is to call back  Patient says that he had retinal laser therapy just recently for a 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  Follow Up Instructions:  3 months recheck   I discussed the assessment and treatment plan with the patient. The patient was provided an opportunity to ask questions and all were answered. The patient agreed with the plan and demonstrated an understanding of the instructions.   The patient was advised to call back or seek an in-person evaluation if the symptoms worsen or if the condition fails to improve as anticipated.  The above assessment and management plan was discussed with the patient. The patient verbalized understanding of and has agreed to the  management plan. Patient is aware to call the clinic if symptoms persist or worsen. Patient is aware when to return to the clinic for a follow-up visit. Patient educated on when it is appropriate to go to the emergency department.    I provided 12 minutes of non-face-to-face time during this encounter.    [name], MD

## 2018-05-02 ENCOUNTER — Other Ambulatory Visit: Payer: Self-pay

## 2018-05-02 ENCOUNTER — Inpatient Hospital Stay (HOSPITAL_COMMUNITY): Payer: Medicare Other | Attending: Hematology

## 2018-05-02 DIAGNOSIS — F329 Major depressive disorder, single episode, unspecified: Secondary | ICD-10-CM | POA: Insufficient documentation

## 2018-05-02 DIAGNOSIS — I10 Essential (primary) hypertension: Secondary | ICD-10-CM | POA: Diagnosis not present

## 2018-05-02 DIAGNOSIS — I4891 Unspecified atrial fibrillation: Secondary | ICD-10-CM | POA: Insufficient documentation

## 2018-05-02 DIAGNOSIS — M129 Arthropathy, unspecified: Secondary | ICD-10-CM | POA: Insufficient documentation

## 2018-05-02 DIAGNOSIS — E7800 Pure hypercholesterolemia, unspecified: Secondary | ICD-10-CM | POA: Insufficient documentation

## 2018-05-02 DIAGNOSIS — Z87891 Personal history of nicotine dependence: Secondary | ICD-10-CM | POA: Insufficient documentation

## 2018-05-02 DIAGNOSIS — Z794 Long term (current) use of insulin: Secondary | ICD-10-CM | POA: Diagnosis not present

## 2018-05-02 DIAGNOSIS — N390 Urinary tract infection, site not specified: Secondary | ICD-10-CM | POA: Diagnosis not present

## 2018-05-02 DIAGNOSIS — Z7901 Long term (current) use of anticoagulants: Secondary | ICD-10-CM | POA: Diagnosis not present

## 2018-05-02 DIAGNOSIS — Z923 Personal history of irradiation: Secondary | ICD-10-CM | POA: Insufficient documentation

## 2018-05-02 DIAGNOSIS — E1165 Type 2 diabetes mellitus with hyperglycemia: Secondary | ICD-10-CM | POA: Insufficient documentation

## 2018-05-02 DIAGNOSIS — R5383 Other fatigue: Secondary | ICD-10-CM | POA: Diagnosis not present

## 2018-05-02 DIAGNOSIS — R251 Tremor, unspecified: Secondary | ICD-10-CM | POA: Insufficient documentation

## 2018-05-02 DIAGNOSIS — N400 Enlarged prostate without lower urinary tract symptoms: Secondary | ICD-10-CM | POA: Insufficient documentation

## 2018-05-02 DIAGNOSIS — D696 Thrombocytopenia, unspecified: Secondary | ICD-10-CM | POA: Diagnosis not present

## 2018-05-02 DIAGNOSIS — E039 Hypothyroidism, unspecified: Secondary | ICD-10-CM | POA: Diagnosis not present

## 2018-05-02 DIAGNOSIS — E669 Obesity, unspecified: Secondary | ICD-10-CM | POA: Diagnosis not present

## 2018-05-02 DIAGNOSIS — Z86010 Personal history of colonic polyps: Secondary | ICD-10-CM | POA: Diagnosis not present

## 2018-05-02 LAB — COMPREHENSIVE METABOLIC PANEL
ALT: 22 U/L (ref 0–44)
AST: 23 U/L (ref 15–41)
Albumin: 3.8 g/dL (ref 3.5–5.0)
Alkaline Phosphatase: 52 U/L (ref 38–126)
Anion gap: 9 (ref 5–15)
BUN: 22 mg/dL (ref 8–23)
CO2: 26 mmol/L (ref 22–32)
Calcium: 8.9 mg/dL (ref 8.9–10.3)
Chloride: 97 mmol/L — ABNORMAL LOW (ref 98–111)
Creatinine, Ser: 1.33 mg/dL — ABNORMAL HIGH (ref 0.61–1.24)
GFR calc Af Amer: >60 mL/min (ref >60)
GFR calc non Af Amer: 52 mL/min — ABNORMAL LOW (ref >60)
Glucose, Bld: 263 mg/dL — ABNORMAL HIGH (ref 70–99)
Potassium: 4.8 mmol/L (ref 3.5–5.1)
Sodium: 132 mmol/L — ABNORMAL LOW (ref 135–145)
Total Bilirubin: 0.3 mg/dL (ref 0.3–1.2)
Total Protein: 6.9 g/dL (ref 6.5–8.1)

## 2018-05-02 LAB — CBC WITH DIFFERENTIAL/PLATELET
Abs Immature Granulocytes: 0.04 10*3/uL (ref 0.00–0.07)
Basophils Absolute: 0 10*3/uL (ref 0.0–0.1)
Basophils Relative: 0 %
Eosinophils Absolute: 0.2 10*3/uL (ref 0.0–0.5)
Eosinophils Relative: 2 %
HCT: 38 % — ABNORMAL LOW (ref 39.0–52.0)
Hemoglobin: 12.7 g/dL — ABNORMAL LOW (ref 13.0–17.0)
Immature Granulocytes: 1 %
Lymphocytes Relative: 23 %
Lymphs Abs: 1.5 10*3/uL (ref 0.7–4.0)
MCH: 33.5 pg (ref 26.0–34.0)
MCHC: 33.4 g/dL (ref 30.0–36.0)
MCV: 100.3 fL — ABNORMAL HIGH (ref 80.0–100.0)
Monocytes Absolute: 0.5 10*3/uL (ref 0.1–1.0)
Monocytes Relative: 7 %
Neutro Abs: 4.6 10*3/uL (ref 1.7–7.7)
Neutrophils Relative %: 67 %
Platelets: 105 10*3/uL — ABNORMAL LOW (ref 150–400)
RBC: 3.79 MIL/uL — ABNORMAL LOW (ref 4.22–5.81)
RDW: 14.6 % (ref 11.5–15.5)
WBC: 6.8 10*3/uL (ref 4.0–10.5)
nRBC: 0 % (ref 0.0–0.2)

## 2018-05-02 LAB — LACTATE DEHYDROGENASE: LDH: 159 U/L (ref 98–192)

## 2018-05-08 DIAGNOSIS — H43811 Vitreous degeneration, right eye: Secondary | ICD-10-CM | POA: Diagnosis not present

## 2018-05-08 DIAGNOSIS — E113592 Type 2 diabetes mellitus with proliferative diabetic retinopathy without macular edema, left eye: Secondary | ICD-10-CM | POA: Diagnosis not present

## 2018-05-08 DIAGNOSIS — E113493 Type 2 diabetes mellitus with severe nonproliferative diabetic retinopathy without macular edema, bilateral: Secondary | ICD-10-CM | POA: Diagnosis not present

## 2018-05-08 DIAGNOSIS — H4312 Vitreous hemorrhage, left eye: Secondary | ICD-10-CM | POA: Diagnosis not present

## 2018-05-09 ENCOUNTER — Inpatient Hospital Stay (HOSPITAL_BASED_OUTPATIENT_CLINIC_OR_DEPARTMENT_OTHER): Payer: Medicare Other | Admitting: Nurse Practitioner

## 2018-05-09 ENCOUNTER — Other Ambulatory Visit: Payer: Self-pay

## 2018-05-09 DIAGNOSIS — E1165 Type 2 diabetes mellitus with hyperglycemia: Secondary | ICD-10-CM

## 2018-05-09 DIAGNOSIS — Z87891 Personal history of nicotine dependence: Secondary | ICD-10-CM

## 2018-05-09 DIAGNOSIS — Z7901 Long term (current) use of anticoagulants: Secondary | ICD-10-CM

## 2018-05-09 DIAGNOSIS — M129 Arthropathy, unspecified: Secondary | ICD-10-CM

## 2018-05-09 DIAGNOSIS — Z923 Personal history of irradiation: Secondary | ICD-10-CM

## 2018-05-09 DIAGNOSIS — E7800 Pure hypercholesterolemia, unspecified: Secondary | ICD-10-CM

## 2018-05-09 DIAGNOSIS — R251 Tremor, unspecified: Secondary | ICD-10-CM

## 2018-05-09 DIAGNOSIS — Z86010 Personal history of colonic polyps: Secondary | ICD-10-CM

## 2018-05-09 DIAGNOSIS — E669 Obesity, unspecified: Secondary | ICD-10-CM

## 2018-05-09 DIAGNOSIS — I4891 Unspecified atrial fibrillation: Secondary | ICD-10-CM | POA: Diagnosis not present

## 2018-05-09 DIAGNOSIS — I10 Essential (primary) hypertension: Secondary | ICD-10-CM

## 2018-05-09 DIAGNOSIS — N390 Urinary tract infection, site not specified: Secondary | ICD-10-CM | POA: Diagnosis not present

## 2018-05-09 DIAGNOSIS — D696 Thrombocytopenia, unspecified: Secondary | ICD-10-CM | POA: Diagnosis not present

## 2018-05-09 DIAGNOSIS — E039 Hypothyroidism, unspecified: Secondary | ICD-10-CM | POA: Diagnosis not present

## 2018-05-09 DIAGNOSIS — R5383 Other fatigue: Secondary | ICD-10-CM | POA: Diagnosis not present

## 2018-05-09 DIAGNOSIS — Z794 Long term (current) use of insulin: Secondary | ICD-10-CM | POA: Diagnosis not present

## 2018-05-09 DIAGNOSIS — F329 Major depressive disorder, single episode, unspecified: Secondary | ICD-10-CM

## 2018-05-09 DIAGNOSIS — N400 Enlarged prostate without lower urinary tract symptoms: Secondary | ICD-10-CM

## 2018-05-09 NOTE — Patient Instructions (Signed)
[facility] at [facility] Discharge Instructions  Follow up in 3 months with labs    Thank you for choosing [facility] at [facility] to provide your oncology and hematology care.  To afford each patient quality time with our provider, please arrive at least 15 minutes before your scheduled appointment time.   If you have a lab appointment with the Cancer Center please come in thru the  Main Entrance and check in at the main information desk  You need to re-schedule your appointment should you arrive 10 or more minutes late.  We strive to give you quality time with our providers, and arriving late affects you and other patients whose appointments are after yours.  Also, if you no show three or more times for appointments you may be dismissed from the clinic at the providers discretion.     Again, thank you for choosing [facility].  Our hope is that these requests will decrease the amount of time that you wait before being seen by our physicians.       _____________________________________________________________  Should you have questions after your visit to  Cancer Center, please contact our office at [phone] between the hours of 8:00 a.m. and 4:30 p.m.  Voicemails left after 4:00 p.m. will not be returned until the following business day.  For prescription refill requests, have your pharmacy contact our office and allow 72 hours.    Cancer Center Support Programs:   > Cancer Support Group  2nd Tuesday of the month 1pm-2pm, Journey Room    

## 2018-05-09 NOTE — Assessment & Plan Note (Signed)
1.  Thrombocytopenia: - Records show he has had thrombocytopenia since 04/2017.  At that time he was diagnosed with serratia UTI.  He denied ever being treated for the UTI. - It was discussed with him that an infection could play a role in the thrombocytopenia.  And he was placed on Bactrim DS 1 tab twice daily for 10 days. - Blood work was sent off for HIV and hepatitis testing which was negative.  Peripheral smear showed no fragmentation. -He was also complaining about joint pain.  SPEP, C-reactive protein, sed rate, and rheumatoid factor were all negative. - Labs done on 05/02/2018 showed platelet count of 105, hemoglobin was 12.7, and LDH was 157. - We will continue to monitor his labs and if platelet count drops below 50,000 he will be set up for a bone marrow aspirate and biopsy for further evaluation. - He will follow-up in 3 months with labs.  2.  Health maintenance: - He has a history of smoking. -CT of the chest done 08/30/2017 was negative.

## 2018-05-09 NOTE — Progress Notes (Signed)
[name], [name] [id number]   CLINIC:  Medical Oncology/Hematology  PCP:  Dettinger, Fransisca Kaufmann, MD [place] [address] [phone]   REASON FOR VISIT: Follow-up for thrombocytopenia  CURRENT THERAPY: Observation   INTERVAL HISTORY:  [name] 73 y.o. male returns for routine follow-up for thrombocytopenia.  He reports he has been doing well since his last visit.  He denies any complaints at this time.  He does struggle with his diabetes which he is following up with his primary care doctor for.  And his energy levels have been low. Denies any nausea, vomiting, or diarrhea. Denies any new pains. Had not noticed any recent bleeding such as epistaxis, hematuria or hematochezia. Denies recent chest pain on exertion, shortness of breath on minimal exertion, pre-syncopal episodes, or palpitations. Denies any numbness or tingling in hands or feet. Denies any recent fevers, infections, or recent hospitalizations. Patient reports appetite at 100% and energy level at 50%.  He is eating well and maintaining his weight at this time.   REVIEW OF SYSTEMS:  Review of Systems  All other systems reviewed and are negative.    PAST MEDICAL/SURGICAL HISTORY:  Past Medical History:  Diagnosis Date  . Arthritis   . Atrial fibrillation ([place])   . BPH (benign prostatic hypertrophy) 04/15/2010  . Cataract   . Colon polyps   . DEPRESSION 01/22/2009  . Heart valve replaced   . HYPERCHOLESTEROLEMIA 01/22/2009  . HYPERTENSION 01/22/2009   Dr. Percival [name]  . 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 Morbid obesity ([place])   . Tremors of nervous system    ?ptsd  . Ulcer    Past Surgical History:  Procedure Laterality Date  . 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 APPENDECTOMY    . bilateral cataract surg    . COLONOSCOPY  04/24/2007   [name]: normal  . COLONOSCOPY WITH  ESOPHAGOGASTRODUODENOSCOPY (EGD) N/A 04/05/2012   Procedure: COLONOSCOPY WITH ESOPHAGOGASTRODUODENOSCOPY (EGD);  Surgeon: [name] Dolin, MD;  Location: [facility];  Service: Endoscopy;  Laterality: N/A;  10;15  . DENTAL SURGERY  05/2004   Dental extractions  . EYE SURGERY Bilateral    cataract  . HEEL SPUR SURGERY Bilateral    resection of heel spur  . KNEE ARTHROSCOPY WITH LATERAL MENISECTOMY Right 04/04/2014   Procedure: KNEE ARTHROSCOPY WITH LATERAL MENISECTOMY;  Surgeon: [name] Civil, MD;  Location: [facility];  Service: Orthopedics;  Laterality: Right;  . PACEMAKER IMPLANT N/A 06/13/2016   Procedure: Pacemaker Implant- Dual Chamber;  Surgeon: [name] Lance, MD;  Location: [facility];  Service: Cardiovascular;  Laterality: N/A;  . PACEMAKER INSERTION  2018  . THYROIDECTOMY  03/21/2013   DR [name]  . THYROIDECTOMY N/A 03/21/2013   Procedure: THYROIDECTOMY;  Surgeon: [name] Regal, MD;  Location: [facility];  Service: General;  Laterality: N/A;  . TONSILLECTOMY       SOCIAL HISTORY:  Social History   Socioeconomic History  . Marital status: Single    Spouse name: Not on file  . Number of children: 1  . Years of education: HS  . Highest education level: Not on file  Occupational History  . Occupation: Administrator, retired    Fish farm manager: RETIRED  Social Needs  . Financial resource strain: Not on file  . Food insecurity:    Worry: Not on file    Inability: Not on  file  . Transportation needs:    Medical: Not on file    Non-medical: Not on file  Tobacco Use  . Smoking status: Former Smoker    Packs/day: 0.00    Years: 12.00    Pack years: 0.00    Types: Cigarettes    Last attempt to quit: 09/20/1961    Years since quitting: 56.6  . Smokeless tobacco: Never Used  Substance and Sexual Activity  . Alcohol use: No  . Drug use: No  . Sexual activity: Never    Comment: Has not smoked in 20 years  Lifestyle  . Physical activity:    Days per week: Not on file     Minutes per session: Not on file  . Stress: Not on file  Relationships  . Social connections:    Talks on phone: Not on file    Gets together: Not on file    Attends religious service: Not on file    Active member of club or organization: Not on file    Attends meetings of clubs or organizations: Not on file    Relationship status: Not on file  . Intimate partner violence:    Fear of current or ex partner: Not on file    Emotionally abused: Not on file    Physically abused: Not on file    Forced sexual activity: Not on file  Other Topics Concern  . Not on file  Social History Narrative   Lives alone.   Quit smoking approximately 28 years ago.   Long haul truck driver, lives in [place].    FAMILY HISTORY:  Family History  Problem Relation Age of Onset  . Heart disease Father   . Congestive Heart Failure Father   . Arthritis Father   . Diabetes Other   . Benign prostatic hyperplasia Brother     CURRENT MEDICATIONS:  Outpatient Encounter Medications as of 05/09/2018  Medication Sig  . acetaminophen (TYLENOL) 500 MG tablet Take 1,000 mg by mouth every 6 (six) hours as needed (pain).  . cholecalciferol (VITAMIN D) 1000 units tablet Take 1,000 Units by mouth daily.   [name] ELIQUIS 5 MG TABS tablet TAKE ONE TABLET BY MOUTH TWICE DAILY  . fluticasone (FLONASE) 50 MCG/ACT nasal spray Place 2 sprays into both nostrils daily.  . furosemide (LASIX) 20 MG tablet Take 1 tablet (20 mg total) by mouth daily.  [name] gabapentin (NEURONTIN) 300 MG capsule Take 1 capsule (300 mg total) by mouth 2 (two) times daily.  [name] glucose blood test strip 1 strip by Does not apply route 3 (three) times daily. Uses true metrix strips (diabetic club)  . insulin detemir (LEVEMIR) 100 UNIT/ML injection Inject 0.47-0.48 mLs (47-48 Units total) into the skin 2 (two) times daily. (Patient taking differently: Inject 42 Units into the skin 2 (two) times daily. )  . Insulin Pen Needle (B-D ULTRAFINE III SHORT PEN) 31G X  8 MM MISC Use to inject Victoza qd  . levothyroxine (SYNTHROID, LEVOTHROID) 200 MCG tablet Take 1 tablet (200 mcg total) by mouth daily before breakfast.  . metFORMIN (GLUCOPHAGE) 1000 MG tablet TAKE 1 TABLET BY MOUTH TWICE DAILY WITH A MEAL.  . Multiple Vitamins-Minerals (MENS MULTI VITAMIN & MINERAL PO) Take 1 tablet by mouth daily.    . Omega-3 Fatty Acids (FISH OIL) 1200 MG CAPS Take 1,200-2,400 mg by mouth 2 (two) times daily. Take 1 capsule (1200 mg) by mouth daily at noon and 2 capsules (2400 mg) daily at bedtime  .  propranolol (INDERAL) 20 MG tablet Take 20 mg by mouth 2 (two) times daily.  . Semaglutide (OZEMPIC) 0.25 or 0.5 MG/DOSE SOPN Inject into the skin.  [name] simvastatin (ZOCOR) 80 MG tablet Take 0.5 tablets (40 mg total) by mouth at bedtime.  . sulfamethoxazole-trimethoprim (BACTRIM DS,SEPTRA DS) 800-160 MG tablet Take 1 tablet by mouth 2 (two) times daily.  . tamsulosin (FLOMAX) 0.4 MG CAPS capsule TAKE TWO CAPSULES BY MOUTH AT BEDTIME   No facility-administered encounter medications on file as of 05/09/2018.     ALLERGIES:  Allergies  Allergen Reactions  . Phenothiazines Anaphylaxis  . Actos [Pioglitazone] Swelling     PHYSICAL EXAM:  ECOG Performance status: 1  Vitals:   05/09/18 1123  BP: 128/60  Pulse: 76  Resp: (!) 98  SpO2: 99%   Filed Weights   05/09/18 1123  Weight: 263 lb 6.4 oz (119.5 kg)    Physical Exam Constitutional:      Appearance: Normal appearance. He is normal weight.  Cardiovascular:     Rate and Rhythm: Normal rate and regular rhythm.     Heart sounds: Normal heart sounds.  Pulmonary:     Effort: Pulmonary effort is normal.     Breath sounds: Normal breath sounds.  Abdominal:     General: Bowel sounds are normal.     Palpations: Abdomen is soft.  Musculoskeletal: Normal range of motion.  Skin:    General: Skin is warm and dry.  Neurological:     Mental Status: He is alert and oriented to person, place, and time. Mental status is  at baseline.  Psychiatric:        Mood and Affect: Mood normal.        Behavior: Behavior normal.        Thought Content: Thought content normal.        Judgment: Judgment normal.      LABORATORY DATA:  I have reviewed the labs as listed.  CBC    Component Value Date/Time   WBC 6.8 05/02/2018 1016   RBC 3.79 (L) 05/02/2018 1016   HGB 12.7 (L) 05/02/2018 1016   HGB 13.0 11/20/2017 0826   HCT 38.0 (L) 05/02/2018 1016   HCT 38.8 11/20/2017 0826   PLT 105 (L) 05/02/2018 1016   PLT 93 (LL) 11/20/2017 0826   MCV 100.3 (H) 05/02/2018 1016   MCV 95 11/20/2017 0826   MCH 33.5 05/02/2018 1016   MCHC 33.4 05/02/2018 1016   RDW 14.6 05/02/2018 1016   RDW 14.5 11/20/2017 0826   LYMPHSABS 1.5 05/02/2018 1016   LYMPHSABS 2.1 11/20/2017 0826   MONOABS 0.5 05/02/2018 1016   EOSABS 0.2 05/02/2018 1016   EOSABS 0.2 11/20/2017 0826   BASOSABS 0.0 05/02/2018 1016   BASOSABS 0.1 11/20/2017 0826   CMP Latest Ref Rng & Units 05/02/2018 01/31/2018 11/20/2017  Glucose 70 - 99 mg/dL 263(H) 269(H) 150(H)  BUN 8 - 23 mg/dL 22 21 18   Creatinine 0.61 - 1.24 mg/dL 1.33(H) 1.22 1.30(H)  Sodium 135 - 145 mmol/L 132(L) 136 138  Potassium 3.5 - 5.1 mmol/L 4.8 4.5 4.8  Chloride 98 - 111 mmol/L 97(L) 103 101  CO2 22 - 32 mmol/L 26 26 24   Calcium 8.9 - 10.3 mg/dL 8.9 8.7(L) 8.9  Total Protein 6.5 - 8.1 g/dL 6.9 6.7 6.5  Total Bilirubin 0.3 - 1.2 mg/dL 0.3 0.6 0.4  Alkaline Phos 38 - 126 U/L 52 48 55  AST 15 - 41 U/L 23 25 18   ALT 0 -  44 U/L 22 22 15    I personally performed a face-to-face visit All questions were answered to patient's stated satisfaction. Encouraged patient to call with any new concerns or questions before his next visit to the cancer center and we can certain see him sooner, if needed.      ASSESSMENT & PLAN:   Thrombocytopenia ([place]) 1.  Thrombocytopenia: - Records show he has had thrombocytopenia since 04/2017.  At that time he was diagnosed with serratia UTI.  He denied ever  being treated for the UTI. - It was discussed with him that an infection could play a role in the thrombocytopenia.  And he was placed on Bactrim DS 1 tab twice daily for 10 days. - Blood work was sent off for HIV and hepatitis testing which was negative.  Peripheral smear showed no fragmentation. -He was also complaining about joint pain.  SPEP, C-reactive protein, sed rate, and rheumatoid factor were all negative. - Labs done on 05/02/2018 showed platelet count of 105, hemoglobin was 12.7, and LDH was 157. - We will continue to monitor his labs and if platelet count drops below 50,000 he will be set up for a bone marrow aspirate and biopsy for further evaluation. - He will follow-up in 3 months with labs.  2.  Health maintenance: - He has a history of smoking. -CT of the chest done 08/30/2017 was negative.      Orders placed this encounter:  Orders Placed This Encounter  Procedures  . Lactate dehydrogenase  . CBC with Differential/Platelet  . Comprehensive metabolic panel  . VITAMIN D 25 Hydroxy (Vit-D Deficiency, Fractures)  . Vitamin B12      Francene Finders, FNP-C [place] [phone]

## 2018-06-01 ENCOUNTER — Encounter: Payer: Self-pay | Admitting: Family

## 2018-06-01 ENCOUNTER — Telehealth: Payer: Self-pay | Admitting: Family Medicine

## 2018-06-01 ENCOUNTER — Ambulatory Visit (INDEPENDENT_AMBULATORY_CARE_PROVIDER_SITE_OTHER): Payer: Medicare Other | Admitting: Family

## 2018-06-01 ENCOUNTER — Other Ambulatory Visit: Payer: Self-pay

## 2018-06-01 DIAGNOSIS — N390 Urinary tract infection, site not specified: Secondary | ICD-10-CM | POA: Diagnosis not present

## 2018-06-01 DIAGNOSIS — N3000 Acute cystitis without hematuria: Secondary | ICD-10-CM

## 2018-06-01 LAB — URINALYSIS, COMPLETE
Bilirubin, UA: NEGATIVE
Ketones, UA: NEGATIVE
Nitrite, UA: NEGATIVE
Protein,UA: NEGATIVE
Specific Gravity, UA: <1.005 — ABNORMAL LOW (ref 1.005–1.030)
Urobilinogen, Ur: 0.2 mg/dL (ref 0.2–1.0)
pH, UA: 5.5 (ref 5.0–7.5)

## 2018-06-01 LAB — MICROSCOPIC EXAMINATION
Epithelial Cells (non renal): >10 /hpf — AB (ref 0–10)
Renal Epithel, UA: NONE SEEN /hpf
WBC, UA: >30 /hpf — AB (ref 0–5)

## 2018-06-01 MED ORDER — CIPROFLOXACIN HCL 500 MG PO TABS: 500.0000 mg | ORAL_TABLET | Freq: Two times a day (BID) | ORAL | 0 refills | Status: DC

## 2018-06-01 NOTE — Telephone Encounter (Signed)
Pt having uti sxs appt scheduled for televisit Pt notified

## 2018-06-01 NOTE — Progress Notes (Signed)
   Virtual Visit via telephone Note  I connected with [name] on 06/01/18 at 9:42 AM by telephone and verified that I am speaking with the correct person using two identifiers. [name] is currently located at home and no one is currently with her during visit. The provider, Evelina Dun, FNP is located in their office at time of visit.  I discussed the limitations, risks, security and privacy concerns of performing an evaluation and management service by telephone and the availability of in person appointments. I also discussed with the patient that there may be a patient responsible charge related to this service. The patient expressed understanding and agreed to proceed.   History and Present Illness:  Urinary Tract Infection   This is a recurrent problem. The current episode started 1 to 4 weeks ago. The problem occurs intermittently. The problem has been waxing and waning. The quality of the pain is described as burning. The pain is at a severity of 4/10. There has been no fever. Associated symptoms include flank pain and urgency. Pertinent negatives include no chills, discharge, frequency, hematuria, hesitancy, nausea or vomiting. He has tried increased fluids for the symptoms. The treatment provided mild relief.      Review of Systems  Constitutional: Negative for chills.  Gastrointestinal: Negative for nausea and vomiting.  Genitourinary: Positive for flank pain and urgency. Negative for frequency, hematuria and hesitancy.  All other systems reviewed and are negative.    Observations/Objective: No SOB or distress noted  Assessment and Plan: 1. Acute cystitis without hematuria - Urinalysis, Complete - Urine Culture - ciprofloxacin (CIPRO) 500 MG tablet; Take 1 tablet (500 mg total) by mouth 2 (two) times daily.  Dispense: 20 tablet; Refill: 0 - Ambulatory referral to Urology  2. Recurrent UTI - Urinalysis, Complete - Urine Culture - ciprofloxacin (CIPRO)  500 MG tablet; Take 1 tablet (500 mg total) by mouth 2 (two) times daily.  Dispense: 20 tablet; Refill: 0 - Ambulatory referral to Urology   Pt will come by and drop urine off. He has been treated 3 times this year for a UTI. I have placed a referral for him to follow up with Urologists.  Labs reviewed RTO if symptoms worsen or do not improve   I discussed the assessment and treatment plan with the patient. The patient was provided an opportunity to ask questions and all were answered. The patient agreed with the plan and demonstrated an understanding of the instructions.   The patient was advised to call back or seek an in-person evaluation if the symptoms worsen or if the condition fails to improve as anticipated.  The above assessment and management plan was discussed with the patient. The patient verbalized understanding of and has agreed to the management plan. Patient is aware to call the clinic if symptoms persist or worsen. Patient is aware when to return to the clinic for a follow-up visit. Patient educated on when it is appropriate to go to the emergency department.   Time call ended:  9:57 AM  I provided 15 minutes of non-face-to-face time during this encounter.    Evelina Dun, FNP

## 2018-06-01 NOTE — Telephone Encounter (Signed)
What symptoms do you have? Hurting when he urinates  How long have you been sick? 2 days  Have you been seen for this problem? Past  If your provider decides to give you a prescription, which pharmacy would you like for it to be sent to? [facility]   Patient informed that this information will be sent to the clinical staff for review and that they should receive a follow up call.

## 2018-06-02 ENCOUNTER — Other Ambulatory Visit: Payer: Self-pay | Admitting: Family Medicine

## 2018-06-03 LAB — URINE CULTURE

## 2018-06-12 ENCOUNTER — Telehealth: Payer: Self-pay | Admitting: Internal Medicine

## 2018-06-12 NOTE — Telephone Encounter (Signed)
Virtual Visit Pre-Appointment Phone Call  "(Name), I am calling you today to discuss your upcoming appointment. We are currently trying to limit exposure to the virus that causes COVID-19 by seeing patients at home rather than in the office."  1. "What is the BEST phone number to call the day of the visit?" - include this in appointment notes  2. Do you have or have access to (through a family member/friend) a smartphone with video capability that we can use for your visit?" a. If yes - list this number in appt notes as cell (if different from BEST phone #) and list the appointment type as a VIDEO visit in appointment notes b. If no - list the appointment type as a PHONE visit in appointment notes  3. Confirm consent - "In the setting of the current Covid19 crisis, you are scheduled for a (phone or video) visit with your provider on (date) at (time).  Just as we do with many in-office visits, in order for you to participate in this visit, we must obtain consent.  If you'd like, I can send this to your mychart (if signed up) or email for you to review.  Otherwise, I can obtain your verbal consent now.  All virtual visits are billed to your insurance company just like a normal visit would be.  By agreeing to a virtual visit, we'd like you to understand that the technology does not allow for your provider to perform an examination, and thus may limit your provider's ability to fully assess your condition. If your provider identifies any concerns that need to be evaluated in person, we will make arrangements to do so.  Finally, though the technology is pretty good, we cannot assure that it will always work on either your or our end, and in the setting of a video visit, we may have to convert it to a phone-only visit.  In either situation, we cannot ensure that we have a secure connection.  Are you willing to proceed?" STAFF: Did the patient verbally acknowledge consent to telehealth visit? Document  YES/NO here: Yes  4. Advise patient to be prepared - "Two hours prior to your appointment, go ahead and check your blood pressure, pulse, oxygen saturation, and your weight (if you have the equipment to check those) and write them all down. When your visit starts, your provider will ask you for this information. If you have an Apple Watch or Kardia device, please plan to have heart rate information ready on the day of your appointment. Please have a pen and paper handy nearby the day of the visit as well."  5. Give patient instructions for MyChart download to smartphone OR [email or web address] as below if video visit (depending on what platform provider is using)  6. Inform patient they will receive a phone call 15 minutes prior to their appointment time (may be from unknown caller ID) so they should be prepared to answer    TELEPHONE CALL NOTE  [name] has been deemed a candidate for a follow-up tele-health visit to limit community exposure during the Covid-19 pandemic. I spoke with the patient via phone to ensure availability of phone/video source, confirm preferred email & phone number, and discuss instructions and expectations.  I reminded [name] to be prepared with any vital sign and/or heart rhythm information that could potentially be obtained via home monitoring, at the time of his visit. I reminded [name] to expect a phone call prior to  his visit.  Orinda Kenner 06/12/2018 3:57 PM

## 2018-06-13 ENCOUNTER — Encounter: Payer: Medicare Other | Admitting: *Deleted

## 2018-06-13 ENCOUNTER — Telehealth: Payer: Self-pay | Admitting: Cardiology

## 2018-06-13 NOTE — Telephone Encounter (Signed)
Patient called and LMOVM concerning his home monitor.   Called pt back. No answer and unable to leave a message.   Pt was scheduled for remote transmission today. Transmission has not been received.

## 2018-06-14 ENCOUNTER — Other Ambulatory Visit: Payer: Self-pay

## 2018-06-14 ENCOUNTER — Telehealth: Payer: Self-pay

## 2018-06-14 ENCOUNTER — Encounter: Payer: Self-pay | Admitting: Internal Medicine

## 2018-06-14 ENCOUNTER — Telehealth: Payer: Self-pay | Admitting: Internal Medicine

## 2018-06-14 NOTE — Telephone Encounter (Signed)
Spoke with patient to remind of missed remote transmission 

## 2018-06-14 NOTE — Telephone Encounter (Signed)
°  1. Has your device fired?   2. Is you device beeping?   3. Are you experiencing draining or swelling at device site?   4. Are you calling to see if we received your device transmission?   5. Have you passed out?  Patient states he takes to you about his machine.     Please route to [place]

## 2018-06-14 NOTE — Telephone Encounter (Signed)
Pt states that he received the error code 3248. [employer] is sending him a new monitor. The pt will receive the monitor in 5-10 business days.

## 2018-06-15 ENCOUNTER — Telehealth (INDEPENDENT_AMBULATORY_CARE_PROVIDER_SITE_OTHER): Payer: Medicare Other | Admitting: Internal Medicine

## 2018-06-15 VITALS — Ht 73.0 in | Wt 254.0 lb

## 2018-06-15 DIAGNOSIS — I10 Essential (primary) hypertension: Secondary | ICD-10-CM

## 2018-06-15 DIAGNOSIS — I480 Paroxysmal atrial fibrillation: Secondary | ICD-10-CM

## 2018-06-15 DIAGNOSIS — I442 Atrioventricular block, complete: Secondary | ICD-10-CM | POA: Diagnosis not present

## 2018-06-15 NOTE — Progress Notes (Signed)
Electrophysiology TeleHealth Note   Due to national recommendations of social distancing due to COVID 19, an audio/video telehealth visit is felt to be most appropriate for this patient at this time.  See MyChart message from today for the patient's consent to telehealth for [facility].   Date:  06/15/2018   ID:  [name], DOB 1945/08/18, MRN [id number]  Location: patient's home  Provider location: [address], [place]  Evaluation Performed: Follow-up visit  PCP:  Dettinger, Fransisca Kaufmann, MD  Cardiologist:  No primary care provider on file.  Electrophysiologist:  Dr Lovena Le  Chief Complaint:  "I am doing ok."  History of Present Illness:    [name] is a 73 y.o. male who presents via audio/video conferencing for a telehealth visit today. He is a pleasant 58 man with CHB, s/p PPM insertion, prior AVR, obesity, and HTN.   He has lost 8 lbs in the past 2 months. His dizziness is improved. Since last being seen in our clinic, the patient reports doing very well except for occasional prostate/UTI issues.  Today, he denies symptoms of palpitations, chest pain, shortness of breath,  lower extremity edema, dizziness, presyncope, or syncope.  The patient is otherwise without complaint today.  The patient denies symptoms of fevers, chills, cough, or new SOB worrisome for COVID 19.  Past Medical History:  Diagnosis Date  . Arthritis   . Atrial fibrillation ([place])   . BPH (benign prostatic hypertrophy) 04/15/2010  . Cataract   . Colon polyps   . DEPRESSION 01/22/2009  . Heart valve replaced   . HYPERCHOLESTEROLEMIA 01/22/2009  . HYPERTENSION 01/22/2009   Dr. [name]  . 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 Morbid obesity ([place])   . Tremors of nervous system    ?ptsd  . Ulcer     Past Surgical History:  Procedure Laterality Date  . 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 APPENDECTOMY    .  bilateral cataract surg    . COLONOSCOPY  04/24/2007   [name]: normal  . COLONOSCOPY WITH ESOPHAGOGASTRODUODENOSCOPY (EGD) N/A 04/05/2012   Procedure: COLONOSCOPY WITH ESOPHAGOGASTRODUODENOSCOPY (EGD);  Surgeon: Daneil Dolin, MD;  [facility];  Service: Endoscopy;  Laterality: N/A;  10;15  . DENTAL SURGERY  05/2004   Dental extractions  . EYE SURGERY Bilateral    cataract  . HEEL SPUR SURGERY Bilateral    resection of heel spur  . KNEE ARTHROSCOPY WITH LATERAL MENISECTOMY Right 04/04/2014   Procedure: KNEE ARTHROSCOPY WITH LATERAL MENISECTOMY;  Surgeon: Carole Civil, MD;  Location: [facility];  Service: Orthopedics;  Laterality: Right;  . PACEMAKER IMPLANT N/A 06/13/2016   Procedure: Pacemaker Implant- Dual Chamber;  Surgeon: Evans Lance, MD;  Location: [place] [facility];  Service: Cardiovascular;  Laterality: N/A;  . PACEMAKER INSERTION  2018  . THYROIDECTOMY  03/21/2013   DR [name]  . THYROIDECTOMY N/A 03/21/2013   Procedure: THYROIDECTOMY;  Surgeon: [name], MD;  Location: [place];  Service: General;  Laterality: N/A;  . TONSILLECTOMY      Current Outpatient Medications  Medication Sig Dispense Refill  . acetaminophen (TYLENOL) 500 MG tablet Take 1,000 mg by mouth every 6 (six) hours as needed (pain).    . cholecalciferol (VITAMIN D) 1000 units tablet Take 1,000 Units by mouth daily.     Marland Kitchen ELIQUIS 5 MG TABS tablet TAKE ONE TABLET BY MOUTH  TWICE DAILY 60 tablet 5  . fluticasone (FLONASE) 50 MCG/ACT nasal spray Place 2 sprays into both nostrils daily. 16 g 6  . furosemide (LASIX) 20 MG tablet Take 1 tablet (20 mg total) by mouth daily. 30 tablet 1  . gabapentin (NEURONTIN) 300 MG capsule Take 1 capsule (300 mg total) by mouth 2 (two) times daily. (Patient taking differently: Take 300 mg by mouth daily. ) 180 capsule 3  . glucose blood test strip 1 strip by Does not apply route 3 (three) times daily. Uses true metrix strips (diabetic club)  12  . insulin detemir  (LEVEMIR) 100 UNIT/ML injection Inject 0.47-0.48 mLs (47-48 Units total) into the skin 2 (two) times daily. (Patient taking differently: Inject 42 Units into the skin 2 (two) times daily. ) 10 mL   . Insulin Pen Needle (B-D ULTRAFINE III SHORT PEN) 31G X 8 MM MISC Use to inject Victoza qd 100 each 5  . levothyroxine (SYNTHROID, LEVOTHROID) 200 MCG tablet Take 1 tablet (200 mcg total) by mouth daily before breakfast. 30 tablet 1  . metFORMIN (GLUCOPHAGE) 1000 MG tablet TAKE 1 TABLET BY MOUTH TWICE DAILY WITH A MEAL. 180 tablet 0  . Multiple Vitamins-Minerals (MENS MULTI VITAMIN & MINERAL PO) Take 1 tablet by mouth daily.      . Omega-3 Fatty Acids (FISH OIL) 1200 MG CAPS Take 1,200-2,400 mg by mouth 2 (two) times daily. Take 1 capsule (1200 mg) by mouth daily at noon and 2 capsules (2400 mg) daily at bedtime    . Semaglutide (OZEMPIC) 0.25 or 0.5 MG/DOSE SOPN Inject into the skin.    Marland Kitchen simvastatin (ZOCOR) 80 MG tablet Take 0.5 tablets (40 mg total) by mouth at bedtime. 15 tablet 0  . tamsulosin (FLOMAX) 0.4 MG CAPS capsule TAKE TWO CAPSULES BY MOUTH AT BEDTIME 180 capsule 0  . propranolol (INDERAL) 20 MG tablet Take 20 mg by mouth 2 (two) times daily.     No current facility-administered medications for this visit.     Allergies:   Phenothiazines and Actos [pioglitazone]   Social History:  The patient  reports that he quit smoking about 56 years ago. His smoking use included cigarettes. He smoked 0.00 packs per day for 12.00 years. He has never used smokeless tobacco. He reports that he does not drink alcohol or use drugs.   Family History:  The patient's  family history includes Arthritis in his father; Benign prostatic hyperplasia in his brother; Congestive Heart Failure in his father; Diabetes in an other family member; Heart disease in his father.   ROS:  Please see the history of present illness.   All other systems are personally reviewed and negative.    Exam:    Vital Signs:  Ht 6\' 1"   (1.854 m)   Wt 254 lb (115.2 kg)   BMI 33.51 kg/m   Well appearing, alert and conversant, regular work of breathing,  good skin color Eyes- anicteric, neuro- grossly intact, skin- no apparent rash or lesions or cyanosis, mouth- oral mucosa is pink   Labs/Other Tests and Data Reviewed:    Recent Labs: 11/20/2017: TSH 0.182 05/02/2018: ALT 22; BUN 22; Creatinine, Ser 1.33; Hemoglobin 12.7; Platelets 105; Potassium 4.8; Sodium 132   Wt Readings from Last 3 Encounters:  06/14/18 254 lb (115.2 kg)  05/09/18 263 lb 6.4 oz (119.5 kg)  04/16/18 264 lb 3.2 oz (119.8 kg)     Other studies personally reviewed: Additional studies/ records that were reviewed today include:  Last device remote is reviewed from [facility] PDF dated 2/20 which reveals normal device function, no arrhythmias    ASSESSMENT & PLAN:    1.  Atrial fib - he is asymptomatic. He was refusing systemic anti-coagulation but started eliquis back in November. 2.  Chronic diastolic heart failure - his symptoms are class 2. He is encouraged to avoid salty food and to lose weight. 3. HTN - his blood pressure is not checked at home. He will continue his current meds.  4. COVID 19 screen The patient denies symptoms of COVID 19 at this time.  The importance of social distancing was discussed today.  Follow-up:  6 months Next remote: this month  Current medicines are reviewed at length with the patient today.   The patient does not have concerns regarding his medicines.  The following changes were made today:  none  Labs/ tests ordered today include: none No orders of the defined types were placed in this encounter.    Patient Risk:  after full review of this patients clinical status, I feel that they are at moderate risk at this time.  Today, I have spent 15 minutes with the patient with telehealth technology discussing all of the above .    Signed, Cristopher Peru, MD  06/15/2018 9:50 AM     [facility] [address] [place] [address] [phone] (office) [phone] (fax)

## 2018-06-15 NOTE — Patient Instructions (Signed)
Medication Instructions: Your physician recommends that you continue on your current medications as directed. Please refer to the Current Medication list given to you today.   Labwork: None today  Procedures/Testing: None today  Follow-Up: 6 months with Dr.Taylor  Any Additional Special Instructions Will Be Listed Below (If Applicable).     If you need a refill on your cardiac medications before your next appointment, please call your pharmacy.    Thank you for choosing [facility] !        

## 2018-06-19 ENCOUNTER — Telehealth: Payer: Self-pay | Admitting: Licensed Clinical Social Worker

## 2018-06-19 NOTE — Telephone Encounter (Signed)
CSW referred to assist patient with obtaining a BP cuff. CSW contacted patient to inform cuff will be delivered to his home. Patient grateful for support and assistance. CSW available as needed. Raquel Sarna, [place], [place]

## 2018-06-20 ENCOUNTER — Telehealth: Payer: Self-pay | Admitting: Family Medicine

## 2018-06-20 NOTE — Telephone Encounter (Signed)
PT is wanting to know if Dr Warrick Parisian will call him something in for UTI thinks he has one

## 2018-06-20 NOTE — Telephone Encounter (Signed)
Patient states that he thinks he is okay and will call us and schedule appointment if he needs to.

## 2018-06-22 ENCOUNTER — Encounter: Payer: Self-pay | Admitting: Cardiology

## 2018-06-24 DIAGNOSIS — Z952 Presence of prosthetic heart valve: Secondary | ICD-10-CM | POA: Diagnosis not present

## 2018-06-24 DIAGNOSIS — Z79899 Other long term (current) drug therapy: Secondary | ICD-10-CM | POA: Diagnosis not present

## 2018-06-24 DIAGNOSIS — I509 Heart failure, unspecified: Secondary | ICD-10-CM | POA: Diagnosis not present

## 2018-06-24 DIAGNOSIS — Z87891 Personal history of nicotine dependence: Secondary | ICD-10-CM | POA: Diagnosis not present

## 2018-06-24 DIAGNOSIS — J449 Chronic obstructive pulmonary disease, unspecified: Secondary | ICD-10-CM | POA: Diagnosis not present

## 2018-06-24 DIAGNOSIS — Z7901 Long term (current) use of anticoagulants: Secondary | ICD-10-CM | POA: Diagnosis not present

## 2018-06-24 DIAGNOSIS — Z794 Long term (current) use of insulin: Secondary | ICD-10-CM | POA: Diagnosis not present

## 2018-06-24 DIAGNOSIS — E119 Type 2 diabetes mellitus without complications: Secondary | ICD-10-CM | POA: Diagnosis not present

## 2018-06-24 DIAGNOSIS — E039 Hypothyroidism, unspecified: Secondary | ICD-10-CM | POA: Diagnosis not present

## 2018-06-24 DIAGNOSIS — J029 Acute pharyngitis, unspecified: Secondary | ICD-10-CM | POA: Diagnosis not present

## 2018-06-25 DIAGNOSIS — B349 Viral infection, unspecified: Secondary | ICD-10-CM | POA: Diagnosis not present

## 2018-06-25 DIAGNOSIS — J069 Acute upper respiratory infection, unspecified: Secondary | ICD-10-CM | POA: Diagnosis not present

## 2018-06-25 DIAGNOSIS — J449 Chronic obstructive pulmonary disease, unspecified: Secondary | ICD-10-CM | POA: Insufficient documentation

## 2018-06-25 DIAGNOSIS — J029 Acute pharyngitis, unspecified: Secondary | ICD-10-CM | POA: Diagnosis not present

## 2018-06-25 NOTE — Telephone Encounter (Signed)
Pt has received his new monitor.He is trying to send a manual transmission. I will call him back in a few minutes.

## 2018-06-25 NOTE — Telephone Encounter (Signed)
New monitor ordered 06/14/2018

## 2018-06-26 NOTE — Telephone Encounter (Signed)
Spoke w/ pt and he tried to send transmission. Got a red light around the circle. Instructed pt to call tech support for further help.

## 2018-06-27 ENCOUNTER — Telehealth: Payer: Self-pay | Admitting: Family Medicine

## 2018-06-27 NOTE — Telephone Encounter (Signed)
Pt had a covid test at [facility] - no lab result on our side of epic - pt aware to call there and check for results.

## 2018-06-28 DIAGNOSIS — H4312 Vitreous hemorrhage, left eye: Secondary | ICD-10-CM | POA: Diagnosis not present

## 2018-06-28 DIAGNOSIS — H43811 Vitreous degeneration, right eye: Secondary | ICD-10-CM | POA: Diagnosis not present

## 2018-06-28 DIAGNOSIS — E113493 Type 2 diabetes mellitus with severe nonproliferative diabetic retinopathy without macular edema, bilateral: Secondary | ICD-10-CM | POA: Diagnosis not present

## 2018-06-28 DIAGNOSIS — E113592 Type 2 diabetes mellitus with proliferative diabetic retinopathy without macular edema, left eye: Secondary | ICD-10-CM | POA: Diagnosis not present

## 2018-06-29 ENCOUNTER — Telehealth: Payer: Self-pay | Admitting: Family Medicine

## 2018-06-29 NOTE — Telephone Encounter (Signed)
Attempted to contact patient - NA °

## 2018-07-06 ENCOUNTER — Ambulatory Visit (INDEPENDENT_AMBULATORY_CARE_PROVIDER_SITE_OTHER): Payer: Medicare Other | Admitting: *Deleted

## 2018-07-06 DIAGNOSIS — I442 Atrioventricular block, complete: Secondary | ICD-10-CM | POA: Diagnosis not present

## 2018-07-06 LAB — CUP PACEART REMOTE DEVICE CHECK
Battery Remaining Longevity: 110 mo
Battery Voltage: 3.01 V
Brady Statistic AP VP Percent: 4.17 %
Brady Statistic AP VS Percent: 0 %
Brady Statistic AS VP Percent: 95.79 %
Brady Statistic AS VS Percent: 0.03 %
Brady Statistic RA Percent Paced: 4.11 %
Brady Statistic RV Percent Paced: 99.97 %
Date Time Interrogation Session: 20200612135510
Implantable Lead Implant Date: 20180521
Implantable Lead Implant Date: 20180521
Implantable Lead Location: 753859
Implantable Lead Location: 753859
Implantable Lead Model: 3830
Implantable Lead Model: 5076
Implantable Pulse Generator Implant Date: 20180521
Lead Channel Impedance Value: 323 Ohm
Lead Channel Impedance Value: 342 Ohm
Lead Channel Impedance Value: 399 Ohm
Lead Channel Impedance Value: 437 Ohm
Lead Channel Pacing Threshold Amplitude: 0.875 V
Lead Channel Pacing Threshold Amplitude: 1 V
Lead Channel Pacing Threshold Pulse Width: 0.4 ms
Lead Channel Pacing Threshold Pulse Width: 0.4 ms
Lead Channel Sensing Intrinsic Amplitude: 1.375 mV
Lead Channel Sensing Intrinsic Amplitude: 1.375 mV
Lead Channel Sensing Intrinsic Amplitude: 19.625 mV
Lead Channel Sensing Intrinsic Amplitude: 19.625 mV
Lead Channel Setting Pacing Amplitude: 1.75 V
Lead Channel Setting Pacing Amplitude: 2.5 V
Lead Channel Setting Pacing Pulse Width: 0.4 ms
Lead Channel Setting Sensing Sensitivity: 2 mV

## 2018-07-06 NOTE — Telephone Encounter (Signed)
No answer

## 2018-07-06 NOTE — Telephone Encounter (Signed)
Normal device function. 0.4% AF burden. Follow up with next remote in 3 months.

## 2018-07-06 NOTE — Telephone Encounter (Signed)
I called the pt to f/u on him calling [facility] tech support. He states he did. I asked him to send a manual transmission with his home monitor. He agreed to do so. Transmission received. He would like for the nurse to call him back with the results.

## 2018-07-10 ENCOUNTER — Encounter: Payer: Self-pay | Admitting: Cardiology

## 2018-07-10 NOTE — Progress Notes (Signed)
Remote pacemaker transmission.   

## 2018-07-31 ENCOUNTER — Inpatient Hospital Stay (HOSPITAL_COMMUNITY): Payer: Medicare Other | Attending: Hematology

## 2018-07-31 ENCOUNTER — Other Ambulatory Visit: Payer: Self-pay

## 2018-07-31 DIAGNOSIS — Z7901 Long term (current) use of anticoagulants: Secondary | ICD-10-CM | POA: Diagnosis not present

## 2018-07-31 DIAGNOSIS — Z79899 Other long term (current) drug therapy: Secondary | ICD-10-CM | POA: Diagnosis not present

## 2018-07-31 DIAGNOSIS — F329 Major depressive disorder, single episode, unspecified: Secondary | ICD-10-CM | POA: Insufficient documentation

## 2018-07-31 DIAGNOSIS — E7800 Pure hypercholesterolemia, unspecified: Secondary | ICD-10-CM | POA: Diagnosis not present

## 2018-07-31 DIAGNOSIS — E039 Hypothyroidism, unspecified: Secondary | ICD-10-CM | POA: Diagnosis not present

## 2018-07-31 DIAGNOSIS — Z87891 Personal history of nicotine dependence: Secondary | ICD-10-CM | POA: Diagnosis not present

## 2018-07-31 DIAGNOSIS — I4891 Unspecified atrial fibrillation: Secondary | ICD-10-CM | POA: Insufficient documentation

## 2018-07-31 DIAGNOSIS — E669 Obesity, unspecified: Secondary | ICD-10-CM | POA: Insufficient documentation

## 2018-07-31 DIAGNOSIS — I10 Essential (primary) hypertension: Secondary | ICD-10-CM | POA: Insufficient documentation

## 2018-07-31 DIAGNOSIS — D696 Thrombocytopenia, unspecified: Secondary | ICD-10-CM | POA: Diagnosis not present

## 2018-07-31 DIAGNOSIS — E538 Deficiency of other specified B group vitamins: Secondary | ICD-10-CM | POA: Diagnosis not present

## 2018-07-31 LAB — CBC WITH DIFFERENTIAL/PLATELET
Abs Immature Granulocytes: 0.04 10*3/uL (ref 0.00–0.07)
Basophils Absolute: 0.1 10*3/uL (ref 0.0–0.1)
Basophils Relative: 1 %
Eosinophils Absolute: 0.2 10*3/uL (ref 0.0–0.5)
Eosinophils Relative: 3 %
HCT: 38.4 % — ABNORMAL LOW (ref 39.0–52.0)
Hemoglobin: 12.8 g/dL — ABNORMAL LOW (ref 13.0–17.0)
Immature Granulocytes: 1 %
Lymphocytes Relative: 26 %
Lymphs Abs: 1.8 10*3/uL (ref 0.7–4.0)
MCH: 33.3 pg (ref 26.0–34.0)
MCHC: 33.3 g/dL (ref 30.0–36.0)
MCV: 100 fL (ref 80.0–100.0)
Monocytes Absolute: 0.6 10*3/uL (ref 0.1–1.0)
Monocytes Relative: 9 %
Neutro Abs: 4.3 10*3/uL (ref 1.7–7.7)
Neutrophils Relative %: 60 %
Platelets: 96 10*3/uL — ABNORMAL LOW (ref 150–400)
RBC: 3.84 MIL/uL — ABNORMAL LOW (ref 4.22–5.81)
RDW: 13.9 % (ref 11.5–15.5)
WBC: 7 10*3/uL (ref 4.0–10.5)
nRBC: 0 % (ref 0.0–0.2)

## 2018-07-31 LAB — COMPREHENSIVE METABOLIC PANEL
ALT: 24 U/L (ref 0–44)
AST: 27 U/L (ref 15–41)
Albumin: 3.9 g/dL (ref 3.5–5.0)
Alkaline Phosphatase: 54 U/L (ref 38–126)
Anion gap: 11 (ref 5–15)
BUN: 17 mg/dL (ref 8–23)
CO2: 25 mmol/L (ref 22–32)
Calcium: 8.7 mg/dL — ABNORMAL LOW (ref 8.9–10.3)
Chloride: 99 mmol/L (ref 98–111)
Creatinine, Ser: 1.19 mg/dL (ref 0.61–1.24)
GFR calc Af Amer: >60 mL/min (ref >60)
GFR calc non Af Amer: 60 mL/min — ABNORMAL LOW (ref >60)
Glucose, Bld: 257 mg/dL — ABNORMAL HIGH (ref 70–99)
Potassium: 4.3 mmol/L (ref 3.5–5.1)
Sodium: 135 mmol/L (ref 135–145)
Total Bilirubin: 0.6 mg/dL (ref 0.3–1.2)
Total Protein: 6.7 g/dL (ref 6.5–8.1)

## 2018-07-31 LAB — LACTATE DEHYDROGENASE: LDH: 160 U/L (ref 98–192)

## 2018-07-31 LAB — VITAMIN B12: Vitamin B-12: 132 pg/mL — ABNORMAL LOW (ref 180–914)

## 2018-08-01 LAB — VITAMIN D 25 HYDROXY (VIT D DEFICIENCY, FRACTURES): Vit D, 25-Hydroxy: 43 ng/mL (ref 30.0–100.0)

## 2018-08-02 DIAGNOSIS — E113592 Type 2 diabetes mellitus with proliferative diabetic retinopathy without macular edema, left eye: Secondary | ICD-10-CM | POA: Diagnosis not present

## 2018-08-02 DIAGNOSIS — E113493 Type 2 diabetes mellitus with severe nonproliferative diabetic retinopathy without macular edema, bilateral: Secondary | ICD-10-CM | POA: Diagnosis not present

## 2018-08-02 DIAGNOSIS — H43811 Vitreous degeneration, right eye: Secondary | ICD-10-CM | POA: Diagnosis not present

## 2018-08-02 DIAGNOSIS — H4312 Vitreous hemorrhage, left eye: Secondary | ICD-10-CM | POA: Diagnosis not present

## 2018-08-06 DIAGNOSIS — H26491 Other secondary cataract, right eye: Secondary | ICD-10-CM | POA: Diagnosis not present

## 2018-08-06 DIAGNOSIS — Z961 Presence of intraocular lens: Secondary | ICD-10-CM | POA: Diagnosis not present

## 2018-08-06 DIAGNOSIS — E113513 Type 2 diabetes mellitus with proliferative diabetic retinopathy with macular edema, bilateral: Secondary | ICD-10-CM | POA: Diagnosis not present

## 2018-08-06 DIAGNOSIS — H26493 Other secondary cataract, bilateral: Secondary | ICD-10-CM | POA: Diagnosis not present

## 2018-08-06 LAB — HM DIABETES EYE EXAM

## 2018-08-07 ENCOUNTER — Encounter (HOSPITAL_COMMUNITY): Payer: Self-pay | Admitting: Nurse Practitioner

## 2018-08-07 ENCOUNTER — Other Ambulatory Visit: Payer: Self-pay

## 2018-08-07 ENCOUNTER — Inpatient Hospital Stay (HOSPITAL_BASED_OUTPATIENT_CLINIC_OR_DEPARTMENT_OTHER): Payer: Medicare Other | Admitting: Nurse Practitioner

## 2018-08-07 VITALS — BP 113/64 | HR 78 | Temp 96.8°F | Resp 16 | Wt 264.0 lb

## 2018-08-07 DIAGNOSIS — E538 Deficiency of other specified B group vitamins: Secondary | ICD-10-CM

## 2018-08-07 DIAGNOSIS — D696 Thrombocytopenia, unspecified: Secondary | ICD-10-CM

## 2018-08-07 DIAGNOSIS — E669 Obesity, unspecified: Secondary | ICD-10-CM

## 2018-08-07 DIAGNOSIS — I10 Essential (primary) hypertension: Secondary | ICD-10-CM | POA: Diagnosis not present

## 2018-08-07 DIAGNOSIS — E039 Hypothyroidism, unspecified: Secondary | ICD-10-CM | POA: Diagnosis not present

## 2018-08-07 DIAGNOSIS — I4891 Unspecified atrial fibrillation: Secondary | ICD-10-CM

## 2018-08-07 DIAGNOSIS — F329 Major depressive disorder, single episode, unspecified: Secondary | ICD-10-CM

## 2018-08-07 DIAGNOSIS — Z7901 Long term (current) use of anticoagulants: Secondary | ICD-10-CM | POA: Diagnosis not present

## 2018-08-07 DIAGNOSIS — Z79899 Other long term (current) drug therapy: Secondary | ICD-10-CM

## 2018-08-07 DIAGNOSIS — E7800 Pure hypercholesterolemia, unspecified: Secondary | ICD-10-CM

## 2018-08-07 DIAGNOSIS — Z87891 Personal history of nicotine dependence: Secondary | ICD-10-CM | POA: Diagnosis not present

## 2018-08-07 MED ORDER — CYANOCOBALAMIN 1000 MCG/ML IJ SOLN
1000.0000 ug | Freq: Once | INTRAMUSCULAR | Status: AC
  Administered 2018-08-07: 14:00:00 1000 ug via INTRAMUSCULAR
  Filled 2018-08-07: qty 1

## 2018-08-07 NOTE — Progress Notes (Signed)
[name], [place] [address]   CLINIC:  Medical Oncology/Hematology  PCP:  Dettinger, Fransisca Kaufmann, MD [place] [address] [phone]   REASON FOR VISIT: Follow-up for thrombocytopenia  CURRENT THERAPY: Observation   INTERVAL HISTORY:  [name] 73 y.o. male returns for routine follow-up for thrombocytopenia.  He reports he has been doing well since his last visit he denies any easy bruising or bleeding.  Denies any nausea, vomiting, or diarrhea. Denies any new pains. Had not noticed any recent bleeding such as epistaxis, hematuria or hematochezia. Denies recent chest pain on exertion, shortness of breath on minimal exertion, pre-syncopal episodes, or palpitations. Denies any numbness or tingling in hands or feet. Denies any recent fevers, infections, or recent hospitalizations. Patient reports appetite at 100% and energy level at 50%.  He is eating well maintain his weight at this time.    REVIEW OF SYSTEMS:  Review of Systems  All other systems reviewed and are negative.    PAST MEDICAL/SURGICAL HISTORY:  Past Medical History:  Diagnosis Date  . Arthritis   . Atrial fibrillation ([place])   . BPH (benign prostatic hypertrophy) 04/15/2010  . Cataract   . Colon polyps   . DEPRESSION 01/22/2009  . Heart valve replaced   . HYPERCHOLESTEROLEMIA 01/22/2009  . HYPERTENSION 01/22/2009   Dr. [name]  . 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 Morbid obesity ([place])   . Tremors of nervous system    ?ptsd  . Ulcer    Past Surgical History:  Procedure Laterality Date  . 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 APPENDECTOMY    . bilateral cataract surg    . COLONOSCOPY  04/24/2007   [name]: normal  . COLONOSCOPY WITH ESOPHAGOGASTRODUODENOSCOPY (EGD) N/A 04/05/2012   Procedure: COLONOSCOPY WITH ESOPHAGOGASTRODUODENOSCOPY (EGD);  Surgeon: Daneil Dolin, MD;  [facility];  Service: Endoscopy;  Laterality: N/A;  10;15  . DENTAL SURGERY  05/2004   Dental extractions  . EYE SURGERY Bilateral    cataract  . HEEL SPUR SURGERY Bilateral    resection of heel spur  . KNEE ARTHROSCOPY WITH LATERAL MENISECTOMY Right 04/04/2014   Procedure: KNEE ARTHROSCOPY WITH LATERAL MENISECTOMY;  Surgeon: Carole Civil, MD;  Location: [facility];  Service: Orthopedics;  Laterality: Right;  . PACEMAKER IMPLANT N/A 06/13/2016   Procedure: Pacemaker Implant- Dual Chamber;  Surgeon: Evans Lance, MD;  Location: [place] [facility];  Service: Cardiovascular;  Laterality: N/A;  . PACEMAKER INSERTION  2018  . THYROIDECTOMY  03/21/2013   DR [name]  . THYROIDECTOMY N/A 03/21/2013   Procedure: THYROIDECTOMY;  Surgeon: [name], MD;  Location: [facility];  Service: General;  Laterality: N/A;  . TONSILLECTOMY       SOCIAL HISTORY:  Social History   Socioeconomic History  . Marital status: Single    Spouse name: Not on file  . Number of children: 1  . Years of education: HS  . Highest education level: Not on file  Occupational History  . Occupation: Administrator, retired    Fish farm manager: RETIRED  Social Needs  . Financial resource strain: Not on file  . Food insecurity    Worry: Not on file    Inability: Not on file  . Transportation needs    Medical: Not on file    Non-medical: Not on file  Tobacco Use  . Smoking status:  Former Smoker    Packs/day: 0.00    Years: 12.00    Pack years: 0.00    Types: Cigarettes    Quit date: 09/20/1961    Years since quitting: 56.9  . Smokeless tobacco: Never Used  Substance and Sexual Activity  . Alcohol use: No  . Drug use: No  . Sexual activity: Never    Comment: Has not smoked in 20 years  Lifestyle  . Physical activity    Days per week: Not on file    Minutes per session: Not on file  . Stress: Not on file  Relationships  . Social Herbalist on phone: Not on file    Gets together: Not on file     Attends religious service: Not on file    Active member of club or organization: Not on file    Attends meetings of clubs or organizations: Not on file    Relationship status: Not on file  . Intimate partner violence    Fear of current or ex partner: Not on file    Emotionally abused: Not on file    Physically abused: Not on file    Forced sexual activity: Not on file  Other Topics Concern  . Not on file  Social History Narrative   Lives alone.   Quit smoking approximately 28 years ago.   Long haul truck driver, lives in [place].    FAMILY HISTORY:  Family History  Problem Relation Age of Onset  . Heart disease Father   . Congestive Heart Failure Father   . Arthritis Father   . Diabetes Other   . Benign prostatic hyperplasia Brother     CURRENT MEDICATIONS:  Outpatient Encounter Medications as of 08/07/2018  Medication Sig  . acetaminophen (TYLENOL) 500 MG tablet Take 1,000 mg by mouth every 6 (six) hours as needed (pain).  . cholecalciferol (VITAMIN D) 1000 units tablet Take 1,000 Units by mouth daily.   Marland Kitchen ELIQUIS 5 MG TABS tablet TAKE ONE TABLET BY MOUTH TWICE DAILY  . fluticasone (FLONASE) 50 MCG/ACT nasal spray Place 2 sprays into both nostrils daily.  . furosemide (LASIX) 20 MG tablet Take 1 tablet (20 mg total) by mouth daily.  Marland Kitchen gabapentin (NEURONTIN) 300 MG capsule Take 1 capsule (300 mg total) by mouth 2 (two) times daily. (Patient taking differently: Take 300 mg by mouth daily. )  . glucose blood test strip 1 strip by Does not apply route 3 (three) times daily. Uses true metrix strips (diabetic club)  . insulin detemir (LEVEMIR) 100 UNIT/ML injection Inject 0.47-0.48 mLs (47-48 Units total) into the skin 2 (two) times daily. (Patient taking differently: Inject 42 Units into the skin 2 (two) times daily. )  . Insulin Pen Needle (B-D ULTRAFINE III SHORT PEN) 31G X 8 MM MISC Use to inject Victoza qd  . levothyroxine (SYNTHROID, LEVOTHROID) 200 MCG tablet Take 1 tablet  (200 mcg total) by mouth daily before breakfast.  . metFORMIN (GLUCOPHAGE) 1000 MG tablet TAKE 1 TABLET BY MOUTH TWICE DAILY WITH A MEAL.  . Multiple Vitamins-Minerals (MENS MULTI VITAMIN & MINERAL PO) Take 1 tablet by mouth daily.    . Omega-3 Fatty Acids (FISH OIL) 1200 MG CAPS Take 1,200-2,400 mg by mouth 2 (two) times daily. Take 1 capsule (1200 mg) by mouth daily at noon and 2 capsules (2400 mg) daily at bedtime  . propranolol (INDERAL) 20 MG tablet Take 20 mg by mouth 2 (two) times daily.  . Semaglutide ([place])  0.25 or 0.5 MG/DOSE SOPN Inject into the skin.  Marland Kitchen simvastatin (ZOCOR) 80 MG tablet Take 0.5 tablets (40 mg total) by mouth at bedtime.  . tamsulosin (FLOMAX) 0.4 MG CAPS capsule TAKE TWO CAPSULES BY MOUTH AT BEDTIME   Facility-Administered Encounter Medications as of 08/07/2018  Medication  . cyanocobalamin ((VITAMIN B-12)) injection 1,000 mcg    ALLERGIES:  Allergies  Allergen Reactions  . Phenothiazines Anaphylaxis  . Actos [Pioglitazone] Swelling     PHYSICAL EXAM:  ECOG Performance status: 1  Vitals:   08/07/18 1300  BP: 113/64  Pulse: 78  Resp: 16  Temp: (!) 96.8 F (36 C)  SpO2: 99%   Filed Weights   08/07/18 1300  Weight: 264 lb (119.7 kg)    Physical Exam Constitutional:      Appearance: He is obese.  Cardiovascular:     Rate and Rhythm: Normal rate and regular rhythm.     Heart sounds: Normal heart sounds.  Pulmonary:     Effort: Pulmonary effort is normal.     Breath sounds: Normal breath sounds.  Abdominal:     General: Bowel sounds are normal.     Palpations: Abdomen is soft.  Musculoskeletal: Normal range of motion.  Skin:    General: Skin is warm and dry.  Neurological:     Mental Status: He is alert and oriented to person, place, and time. Mental status is at baseline.  Psychiatric:        Mood and Affect: Mood normal.        Behavior: Behavior normal.        Thought Content: Thought content normal.        Judgment: Judgment  normal.      LABORATORY DATA:  I have reviewed the labs as listed.  CBC    Component Value Date/Time   WBC 7.0 07/31/2018 0943   RBC 3.84 (L) 07/31/2018 0943   HGB 12.8 (L) 07/31/2018 0943   HGB 13.0 11/20/2017 0826   HCT 38.4 (L) 07/31/2018 0943   HCT 38.8 11/20/2017 0826   PLT 96 (L) 07/31/2018 0943   PLT 93 (LL) 11/20/2017 0826   MCV 100.0 07/31/2018 0943   MCV 95 11/20/2017 0826   MCH 33.3 07/31/2018 0943   MCHC 33.3 07/31/2018 0943   RDW 13.9 07/31/2018 0943   RDW 14.5 11/20/2017 0826   LYMPHSABS 1.8 07/31/2018 0943   LYMPHSABS 2.1 11/20/2017 0826   MONOABS 0.6 07/31/2018 0943   EOSABS 0.2 07/31/2018 0943   EOSABS 0.2 11/20/2017 0826   BASOSABS 0.1 07/31/2018 0943   BASOSABS 0.1 11/20/2017 0826   CMP Latest Ref Rng & Units 07/31/2018 05/02/2018 01/31/2018  Glucose 70 - 99 mg/dL 257(H) 263(H) 269(H)  BUN 8 - 23 mg/dL 17 22 21   Creatinine 0.61 - 1.24 mg/dL 1.19 1.33(H) 1.22  Sodium 135 - 145 mmol/L 135 132(L) 136  Potassium 3.5 - 5.1 mmol/L 4.3 4.8 4.5  Chloride 98 - 111 mmol/L 99 97(L) 103  CO2 22 - 32 mmol/L 25 26 26   Calcium 8.9 - 10.3 mg/dL 8.7(L) 8.9 8.7(L)  Total Protein 6.5 - 8.1 g/dL 6.7 6.9 6.7  Total Bilirubin 0.3 - 1.2 mg/dL 0.6 0.3 0.6  Alkaline Phos 38 - 126 U/L 54 52 48  AST 15 - 41 U/L 27 23 25   ALT 0 - 44 U/L 24 22 22     I personally performed a face-to-face visit.  All questions were answered to patient's stated satisfaction. Encouraged patient to call with any  new concerns or questions before his next visit to the cancer center and we can certain see him sooner, if needed.     ASSESSMENT & PLAN:   Thrombocytopenia ([place]) 1.  Thrombocytopenia: - Records show he has had thrombocytopenia since 04/2017.  At that time he was diagnosed with serratia UTI.  He denied ever being treated for the UTI. - It was discussed with him that an infection could play a role in the thrombocytopenia.  And he was placed on Bactrim DS 1 tab twice daily for 10 days. -  Blood work was sent off for HIV and hepatitis testing which was negative.  Peripheral smear showed no fragmentation. -He was also complaining about joint pain.  SPEP, C-reactive protein, sed rate, and rheumatoid factor were all negative. - Labs done on 07/31/2018 showed his platelet count 96, hemoglobin 12.8, LDH 160, WBC 7.0 - We will continue to monitor his labs and if platelet count drops below 50,000 he will be set up for a bone marrow aspirate and biopsy for further evaluation. - He will follow-up in 5 months with labs.  2.  Vitamin B12 deficiency: - Labs on 07/31/2018 showed his vitamin B12 level 132. -We will give him an injection of vitamin B12 today at the clinic. - We recommend that he take vitamin B12 oral daily. -We will recheck his levels and 5 months.  3.  Health maintenance: - He has a history of smoking. -CT of the chest done 08/30/2017 was negative.      Orders placed this encounter:  Orders Placed This Encounter  Procedures  . Lactate dehydrogenase  . CBC with Differential/Platelet  . Comprehensive metabolic panel  . Ferritin  . Iron and TIBC  . Vitamin B12  . VITAMIN D 25 Hydroxy (Vit-D Deficiency, Fractures)  . Folate      Francene Finders, FNP-C [place] [phone]

## 2018-08-07 NOTE — Assessment & Plan Note (Addendum)
1.  Thrombocytopenia: - Records show he has had thrombocytopenia since 04/2017.  At that time he was diagnosed with serratia UTI.  He denied ever being treated for the UTI. - It was discussed with him that an infection could play a role in the thrombocytopenia.  And he was placed on Bactrim DS 1 tab twice daily for 10 days. - Blood work was sent off for HIV and hepatitis testing which was negative.  Peripheral smear showed no fragmentation. -He was also complaining about joint pain.  SPEP, C-reactive protein, sed rate, and rheumatoid factor were all negative. - Labs done on 07/31/2018 showed his platelet count 96, hemoglobin 12.8, LDH 160, WBC 7.0 - We will continue to monitor his labs and if platelet count drops below 50,000 he will be set up for a bone marrow aspirate and biopsy for further evaluation. - He will follow-up in 5 months with labs.  2.  Vitamin B12 deficiency: - Labs on 07/31/2018 showed his vitamin B12 level 132. -We will give him an injection of vitamin B12 today at the clinic. - We recommend that he take vitamin B12 oral daily. -We will recheck his levels and 5 months.  3.  Health maintenance: - He has a history of smoking. -CT of the chest done 08/30/2017 was negative.

## 2018-08-07 NOTE — Progress Notes (Signed)
.  [name] presents today for injection per the provider's orders.  B12 administrated without incident; see MAR for injection details.  Patient tolerated procedure well and without incident.  No questions or complaints noted at this time.

## 2018-08-07 NOTE — Patient Instructions (Signed)
[facility] Discharge Instructions  Follow up in 5 months with labs    Thank you for choosing [facility] at [facility] to provide your oncology and hematology care.  To afford each patient quality time with our provider, please arrive at least 15 minutes before your scheduled appointment time.   If you have a lab appointment with the Cancer Center please come in thru the  Main Entrance and check in at the main information desk  You need to re-schedule your appointment should you arrive 10 or more minutes late.  We strive to give you quality time with our providers, and arriving late affects you and other patients whose appointments are after yours.  Also, if you no show three or more times for appointments you may be dismissed from the clinic at the providers discretion.     Again, thank you for choosing [facility].  Our hope is that these requests will decrease the amount of time that you wait before being seen by our physicians.       _____________________________________________________________  Should you have questions after your visit to [facility], please contact our office at [phone] between the hours of 8:00 a.m. and 4:30 p.m.  Voicemails left after 4:00 p.m. will not be returned until the following business day.  For prescription refill requests, have your pharmacy contact our office and allow 72 hours.    Cancer Center Support Programs:   > Cancer Support Group  2nd Tuesday of the month 1pm-2pm, Journey Room    

## 2018-08-09 DIAGNOSIS — M25552 Pain in left hip: Secondary | ICD-10-CM | POA: Diagnosis not present

## 2018-08-17 ENCOUNTER — Other Ambulatory Visit: Payer: Self-pay

## 2018-08-17 ENCOUNTER — Ambulatory Visit (INDEPENDENT_AMBULATORY_CARE_PROVIDER_SITE_OTHER): Payer: Medicare Other | Admitting: Family Medicine

## 2018-08-17 NOTE — Progress Notes (Signed)
Patient did not answer his phone after 2 occasions, called back to our office and then we called again and he did not answer again.

## 2018-08-25 DIAGNOSIS — E039 Hypothyroidism, unspecified: Secondary | ICD-10-CM | POA: Diagnosis not present

## 2018-08-25 DIAGNOSIS — S01511A Laceration without foreign body of lip, initial encounter: Secondary | ICD-10-CM | POA: Diagnosis not present

## 2018-08-25 DIAGNOSIS — Z7901 Long term (current) use of anticoagulants: Secondary | ICD-10-CM | POA: Diagnosis not present

## 2018-08-25 DIAGNOSIS — I509 Heart failure, unspecified: Secondary | ICD-10-CM | POA: Diagnosis not present

## 2018-08-25 DIAGNOSIS — S0181XA Laceration without foreign body of other part of head, initial encounter: Secondary | ICD-10-CM | POA: Diagnosis not present

## 2018-08-25 DIAGNOSIS — E119 Type 2 diabetes mellitus without complications: Secondary | ICD-10-CM | POA: Diagnosis not present

## 2018-08-25 DIAGNOSIS — Z794 Long term (current) use of insulin: Secondary | ICD-10-CM | POA: Diagnosis not present

## 2018-08-25 DIAGNOSIS — Z79899 Other long term (current) drug therapy: Secondary | ICD-10-CM | POA: Diagnosis not present

## 2018-08-25 DIAGNOSIS — Z952 Presence of prosthetic heart valve: Secondary | ICD-10-CM | POA: Diagnosis not present

## 2018-08-25 DIAGNOSIS — J449 Chronic obstructive pulmonary disease, unspecified: Secondary | ICD-10-CM | POA: Diagnosis not present

## 2018-08-25 DIAGNOSIS — Z23 Encounter for immunization: Secondary | ICD-10-CM | POA: Diagnosis not present

## 2018-08-25 DIAGNOSIS — Z87891 Personal history of nicotine dependence: Secondary | ICD-10-CM | POA: Diagnosis not present

## 2018-08-29 ENCOUNTER — Telehealth: Payer: Self-pay | Admitting: Family Medicine

## 2018-08-29 NOTE — Telephone Encounter (Signed)
NA, no VM

## 2018-08-31 ENCOUNTER — Other Ambulatory Visit: Payer: Self-pay

## 2018-08-31 ENCOUNTER — Ambulatory Visit (INDEPENDENT_AMBULATORY_CARE_PROVIDER_SITE_OTHER): Payer: Medicare Other | Admitting: Family Medicine

## 2018-08-31 DIAGNOSIS — S0083XA Contusion of other part of head, initial encounter: Secondary | ICD-10-CM | POA: Diagnosis not present

## 2018-08-31 NOTE — Patient Instructions (Signed)
Contusion A contusion is a deep bruise. This is a result of an injury that causes bleeding under the skin. Symptoms of bruising include pain, swelling, and discolored skin. The skin may turn blue, purple, or yellow. Follow these instructions at home: Managing pain, stiffness, and swelling You may use RICE. This stands for:  Resting.  Icing.  Compression, or putting pressure.  Elevating, or raising the injured area. To follow this method, do these actions:  Rest the injured area.  If told, put ice on the injured area. ? Put ice in a plastic bag. ? Place a towel between your skin and the bag. ? Leave the ice on for 20 minutes, 2-3 times per day.  If told, put light pressure (compression) on the injured area using an elastic bandage. Make sure the bandage is not too tight. If the area tingles or becomes numb, remove it and put it back on as told by your doctor.  If possible, raise (elevate) the injured area above the level of your heart while you are sitting or lying down.  General instructions  Take over-the-counter and prescription medicines only as told by your doctor.  Keep all follow-up visits as told by your doctor. This is important. Contact a doctor if:  Your symptoms do not get better after several days of treatment.  Your symptoms get worse.  You have trouble moving the injured area. Get help right away if:  You have very bad pain.  You have a loss of feeling (numbness) in a hand or foot.  Your hand or foot turns pale or cold. Summary  A contusion is a deep bruise. This is a result of an injury that causes bleeding under the skin.  Symptoms of bruising include pain, swelling, and discolored skin. The skin may turn blue, purple, or yellow.  This condition is treated with rest, ice, compression, and elevation. This is also called RICE. You may be given over-the-counter medicines for pain.  Contact a doctor if you do not feel better, or you feel worse. Get  help right away if you have very bad pain, have lost feeling in a hand or foot, or the area turns pale or cold. This information is not intended to replace advice given to you by your health care provider. Make sure you discuss any questions you have with your health care provider. Document Released: 06/29/2007 Document Revised: 09/01/2017 Document Reviewed: 09/01/2017 Elsevier Patient Education  2020 Elsevier Inc.  
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
## 2018-09-01 DIAGNOSIS — R197 Diarrhea, unspecified: Secondary | ICD-10-CM | POA: Diagnosis not present

## 2018-09-01 DIAGNOSIS — Z4802 Encounter for removal of sutures: Secondary | ICD-10-CM | POA: Diagnosis not present

## 2018-09-03 NOTE — Telephone Encounter (Signed)
Patient had telephone consult with the provider to discuss his health.

## 2018-09-10 ENCOUNTER — Other Ambulatory Visit: Payer: Self-pay | Admitting: Family Medicine

## 2018-09-10 MED ORDER — CIPROFLOXACIN HCL 500 MG PO TABS: 500.0000 mg | ORAL_TABLET | Freq: Two times a day (BID) | ORAL | 0 refills | Status: DC

## 2018-09-10 NOTE — Telephone Encounter (Signed)
Please let him know that I sent in Cipro for him

## 2018-09-10 NOTE — Telephone Encounter (Signed)
Patient notified and verbalized understanding. 

## 2018-09-10 NOTE — Telephone Encounter (Signed)
Patient states he is having UTI symptoms, burning with urination and would like a antibiotic sent to pharmacy

## 2018-09-11 ENCOUNTER — Other Ambulatory Visit: Payer: Self-pay | Admitting: Family Medicine

## 2018-09-13 ENCOUNTER — Encounter: Payer: Self-pay | Admitting: Family Medicine

## 2018-09-13 ENCOUNTER — Ambulatory Visit (INDEPENDENT_AMBULATORY_CARE_PROVIDER_SITE_OTHER): Payer: Medicare Other | Admitting: Family Medicine

## 2018-09-13 DIAGNOSIS — R739 Hyperglycemia, unspecified: Secondary | ICD-10-CM | POA: Diagnosis not present

## 2018-09-13 DIAGNOSIS — Z952 Presence of prosthetic heart valve: Secondary | ICD-10-CM | POA: Diagnosis not present

## 2018-09-13 DIAGNOSIS — Z7901 Long term (current) use of anticoagulants: Secondary | ICD-10-CM | POA: Diagnosis not present

## 2018-09-13 DIAGNOSIS — J449 Chronic obstructive pulmonary disease, unspecified: Secondary | ICD-10-CM | POA: Diagnosis not present

## 2018-09-13 DIAGNOSIS — R358 Other polyuria: Secondary | ICD-10-CM

## 2018-09-13 DIAGNOSIS — Z87891 Personal history of nicotine dependence: Secondary | ICD-10-CM | POA: Diagnosis not present

## 2018-09-13 DIAGNOSIS — E039 Hypothyroidism, unspecified: Secondary | ICD-10-CM | POA: Diagnosis not present

## 2018-09-13 DIAGNOSIS — E119 Type 2 diabetes mellitus without complications: Secondary | ICD-10-CM | POA: Diagnosis not present

## 2018-09-13 DIAGNOSIS — R3589 Other polyuria: Secondary | ICD-10-CM

## 2018-09-13 DIAGNOSIS — Z79899 Other long term (current) drug therapy: Secondary | ICD-10-CM | POA: Diagnosis not present

## 2018-09-13 DIAGNOSIS — R531 Weakness: Secondary | ICD-10-CM | POA: Diagnosis not present

## 2018-09-13 DIAGNOSIS — Z794 Long term (current) use of insulin: Secondary | ICD-10-CM | POA: Diagnosis not present

## 2018-09-13 DIAGNOSIS — R300 Dysuria: Secondary | ICD-10-CM | POA: Diagnosis not present

## 2018-09-13 DIAGNOSIS — N390 Urinary tract infection, site not specified: Secondary | ICD-10-CM | POA: Diagnosis not present

## 2018-09-13 DIAGNOSIS — I509 Heart failure, unspecified: Secondary | ICD-10-CM | POA: Diagnosis not present

## 2018-09-13 DIAGNOSIS — E875 Hyperkalemia: Secondary | ICD-10-CM | POA: Diagnosis not present

## 2018-09-13 NOTE — Progress Notes (Signed)
Virtual Visit via telephone Note Due to COVID-19 pandemic this visit was conducted virtually. This visit type was conducted due to national recommendations for restrictions regarding the COVID-19 Pandemic (e.g. social distancing, sheltering in place) in an effort to limit this patient's exposure and mitigate transmission in our community. All issues noted in this document were discussed and addressed.  A physical exam was not performed with this format.   I connected with [name] on 09/13/18 at 1445 by telephone and verified that I am speaking with the correct person using two identifiers. [name] is currently located at home and no one is currently with them during visit. The provider, Monia Pouch, FNP is located in their office at time of visit.  I discussed the limitations, risks, security and privacy concerns of performing an evaluation and management service by telephone and the availability of in person appointments. I also discussed with the patient that there may be a patient responsible charge related to this service. The patient expressed understanding and agreed to proceed.  Subjective:  Patient ID: [name], male    DOB: 01/15/1946, 73 y.o.   MRN: [id number]  Chief Complaint:  Feeling bad   HPI: [name] is a 73 y.o. male presenting on 09/13/2018 for Feeling bad   Pt states he woke up this morning feeling really bad. States his legs and feet were numb and tingling. States he felt weak all over and his blood sugar was elevated. Pt states he voided at least 10 times on one hour. States he went to the ED but started to feel better so he did not check in. Pt states he continues to have generalized weakness and urinary frequency with some dysuria. He has not checked his temperature. Pt is currently on Cipro for an urinary tract infection.     Relevant past medical, surgical, family, and social history reviewed and updated as indicated.  Allergies and  medications reviewed and updated.   Past Medical History:  Diagnosis Date  . Arthritis   . Atrial fibrillation ([place])   . BPH (benign prostatic hypertrophy) 04/15/2010  . Cataract   . Colon polyps   . DEPRESSION 01/22/2009  . Heart valve replaced   . HYPERCHOLESTEROLEMIA 01/22/2009  . HYPERTENSION 01/22/2009   Dr. [name]  . 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 Morbid obesity ([place])   . Tremors of nervous system    ?ptsd  . Ulcer     Past Surgical History:  Procedure Laterality Date  . 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 APPENDECTOMY    . bilateral cataract surg    . COLONOSCOPY  04/24/2007   [name]: normal  . COLONOSCOPY WITH ESOPHAGOGASTRODUODENOSCOPY (EGD) N/A 04/05/2012   Procedure: COLONOSCOPY WITH ESOPHAGOGASTRODUODENOSCOPY (EGD);  Surgeon: Daneil Dolin, MD;  Location: [facility];  Service: Endoscopy;  Laterality: N/A;  10;15  . DENTAL SURGERY  05/2004   Dental extractions  . EYE SURGERY Bilateral    cataract  . HEEL SPUR SURGERY Bilateral    resection of heel spur  . KNEE ARTHROSCOPY WITH LATERAL MENISECTOMY Right 04/04/2014   Procedure: KNEE ARTHROSCOPY WITH LATERAL MENISECTOMY;  Surgeon: Carole Civil, MD;  Location: [facility] ORS;  Service: Orthopedics;  Laterality: Right;  . PACEMAKER IMPLANT N/A 06/13/2016   Procedure: Pacemaker Implant- Dual Chamber;  Surgeon: Evans Lance, MD;  Location: [place];  Service: Cardiovascular;  Laterality: N/A;  .  PACEMAKER INSERTION  2018  . THYROIDECTOMY  03/21/2013   DR [name]  . THYROIDECTOMY N/A 03/21/2013   Procedure: THYROIDECTOMY;  Surgeon: [name], MD;  Location: [facility];  Service: General;  Laterality: N/A;  . TONSILLECTOMY      Social History   Socioeconomic History  . Marital status: Single    Spouse name: Not on file  . Number of children: 1  . Years of education: HS  . Highest education level: Not on file  Occupational  History  . Occupation: Administrator, retired    Fish farm manager: RETIRED  Social Needs  . Financial resource strain: Not on file  . Food insecurity    Worry: Not on file    Inability: Not on file  . Transportation needs    Medical: Not on file    Non-medical: Not on file  Tobacco Use  . Smoking status: Former Smoker    Packs/day: 0.00    Years: 12.00    Pack years: 0.00    Types: Cigarettes    Quit date: 09/20/1961    Years since quitting: 57.0  . Smokeless tobacco: Never Used  Substance and Sexual Activity  . Alcohol use: No  . Drug use: No  . Sexual activity: Never    Comment: Has not smoked in 20 years  Lifestyle  . Physical activity    Days per week: Not on file    Minutes per session: Not on file  . Stress: Not on file  Relationships  . Social Herbalist on phone: Not on file    Gets together: Not on file    Attends religious service: Not on file    Active member of club or organization: Not on file    Attends meetings of clubs or organizations: Not on file    Relationship status: Not on file  . Intimate partner violence    Fear of current or ex partner: Not on file    Emotionally abused: Not on file    Physically abused: Not on file    Forced sexual activity: Not on file  Other Topics Concern  . Not on file  Social History Narrative   Lives alone.   Quit smoking approximately 28 years ago.   Long haul truck driver, lives in [place].    Outpatient Encounter Medications as of 09/13/2018  Medication Sig  . acetaminophen (TYLENOL) 500 MG tablet Take 1,000 mg by mouth every 6 (six) hours as needed (pain).  . cholecalciferol (VITAMIN D) 1000 units tablet Take 1,000 Units by mouth daily.   . ciprofloxacin (CIPRO) 500 MG tablet Take 1 tablet (500 mg total) by mouth 2 (two) times daily.  Marland Kitchen ELIQUIS 5 MG TABS tablet TAKE ONE TABLET BY MOUTH TWICE DAILY  . fluticasone (FLONASE) 50 MCG/ACT nasal spray Place 2 sprays into both nostrils daily.  . furosemide  (LASIX) 20 MG tablet Take 1 tablet (20 mg total) by mouth daily.  Marland Kitchen gabapentin (NEURONTIN) 300 MG capsule Take 1 capsule (300 mg total) by mouth 2 (two) times daily. (Patient taking differently: Take 300 mg by mouth daily. )  . glucose blood test strip 1 strip by Does not apply route 3 (three) times daily. Uses true metrix strips (diabetic club)  . insulin detemir (LEVEMIR) 100 UNIT/ML injection Inject 0.47-0.48 mLs (47-48 Units total) into the skin 2 (two) times daily. (Patient taking differently: Inject 42 Units into the skin 2 (two) times daily. )  . Insulin Pen Needle (B-D  ULTRAFINE III SHORT PEN) 31G X 8 MM MISC Use to inject Victoza qd  . levothyroxine (SYNTHROID, LEVOTHROID) 200 MCG tablet Take 1 tablet (200 mcg total) by mouth daily before breakfast.  . metFORMIN (GLUCOPHAGE) 1000 MG tablet TAKE 1 TABLET BY MOUTH TWICE DAILY WITH A MEAL.  . Multiple Vitamins-Minerals (MENS MULTI VITAMIN & MINERAL PO) Take 1 tablet by mouth daily.    . Omega-3 Fatty Acids (FISH OIL) 1200 MG CAPS Take 1,200-2,400 mg by mouth 2 (two) times daily. Take 1 capsule (1200 mg) by mouth daily at noon and 2 capsules (2400 mg) daily at bedtime  . propranolol (INDERAL) 20 MG tablet Take 20 mg by mouth 2 (two) times daily.  . Semaglutide (OZEMPIC) 0.25 or 0.5 MG/DOSE SOPN Inject into the skin.  Marland Kitchen simvastatin (ZOCOR) 80 MG tablet Take 0.5 tablets (40 mg total) by mouth at bedtime.  . tamsulosin (FLOMAX) 0.4 MG CAPS capsule TAKE TWO CAPSULES BY MOUTH AT BEDTIME   No facility-administered encounter medications on file as of 09/13/2018.     Allergies  Allergen Reactions  . Phenothiazines Anaphylaxis  . Actos [Pioglitazone] Swelling    Review of Systems  Constitutional: Positive for activity change, appetite change and fatigue. Negative for chills, diaphoresis, fever and unexpected weight change.  Eyes: Negative for photophobia and visual disturbance.  Respiratory: Negative for cough, chest tightness, shortness of  breath and wheezing.   Cardiovascular: Negative for chest pain and palpitations.  Gastrointestinal: Negative for abdominal pain, diarrhea, nausea and vomiting.  Endocrine: Positive for polyuria.  Genitourinary: Positive for dysuria, frequency and urgency. Negative for hematuria.  Neurological: Positive for weakness. Negative for dizziness, syncope, light-headedness and headaches.  Psychiatric/Behavioral: Negative for confusion.  All other systems reviewed and are negative.        Observations/Objective: No vital signs or physical exam, this was a telephone or virtual health encounter.  Pt alert and oriented, answers all questions appropriately, and able to speak in full sentences.    Assessment and Plan: Kenn was seen today for feeling bad.  Diagnoses and all orders for this visit:  Polyuria Generalized weakness Dysuria Hyperglycemia  Reported symptoms concerning for possible, but not limited to, urosepsis, DKA, dehydration, or neurovascular / cardiovascular event. Pt was advised to return to the ED to be evaluated. Pt verbalized understanding and will go.    Follow Up Instructions: Return if symptoms worsen or fail to improve.    I discussed the assessment and treatment plan with the patient. The patient was provided an opportunity to ask questions and all were answered. The patient agreed with the plan and demonstrated an understanding of the instructions.   The patient was advised to call back or seek an in-person evaluation if the symptoms worsen or if the condition fails to improve as anticipated.  The above assessment and management plan was discussed with the patient. The patient verbalized understanding of and has agreed to the management plan. Patient is aware to call the clinic if symptoms persist or worsen. Patient is aware when to return to the clinic for a follow-up visit. Patient educated on when it is appropriate to go to the emergency department.    I  provided 15 minutes of non-face-to-face time during this encounter. The call started at 1445. The call ended at 1500. The other time was used for coordination of care.    Monia Pouch, FNP-C [facility] [address] [place], [place] [address] [phone] 09/13/18

## 2018-09-15 DIAGNOSIS — Z79899 Other long term (current) drug therapy: Secondary | ICD-10-CM | POA: Diagnosis not present

## 2018-09-15 DIAGNOSIS — E039 Hypothyroidism, unspecified: Secondary | ICD-10-CM | POA: Diagnosis not present

## 2018-09-15 DIAGNOSIS — N390 Urinary tract infection, site not specified: Secondary | ICD-10-CM | POA: Diagnosis not present

## 2018-09-15 DIAGNOSIS — E119 Type 2 diabetes mellitus without complications: Secondary | ICD-10-CM | POA: Diagnosis not present

## 2018-09-15 DIAGNOSIS — J449 Chronic obstructive pulmonary disease, unspecified: Secondary | ICD-10-CM | POA: Diagnosis not present

## 2018-09-15 DIAGNOSIS — Z87891 Personal history of nicotine dependence: Secondary | ICD-10-CM | POA: Diagnosis not present

## 2018-09-15 DIAGNOSIS — I509 Heart failure, unspecified: Secondary | ICD-10-CM | POA: Diagnosis not present

## 2018-09-15 DIAGNOSIS — I447 Left bundle-branch block, unspecified: Secondary | ICD-10-CM | POA: Diagnosis not present

## 2018-09-15 DIAGNOSIS — R531 Weakness: Secondary | ICD-10-CM | POA: Diagnosis not present

## 2018-09-15 DIAGNOSIS — Z7901 Long term (current) use of anticoagulants: Secondary | ICD-10-CM | POA: Diagnosis not present

## 2018-09-15 DIAGNOSIS — Z888 Allergy status to other drugs, medicaments and biological substances status: Secondary | ICD-10-CM | POA: Diagnosis not present

## 2018-09-15 DIAGNOSIS — Z794 Long term (current) use of insulin: Secondary | ICD-10-CM | POA: Diagnosis not present

## 2018-10-02 ENCOUNTER — Encounter (HOSPITAL_COMMUNITY): Payer: Self-pay | Admitting: Emergency Medicine

## 2018-10-02 ENCOUNTER — Emergency Department (HOSPITAL_COMMUNITY)
Admission: EM | Admit: 2018-10-02 | Discharge: 2018-10-02 | Disposition: A | Discharge status: Home / Self Care | Payer: Medicare Other | Attending: Emergency Medicine | Admitting: Emergency Medicine

## 2018-10-02 ENCOUNTER — Emergency Department (HOSPITAL_COMMUNITY): Payer: Medicare Other

## 2018-10-02 ENCOUNTER — Other Ambulatory Visit: Payer: Self-pay

## 2018-10-02 DIAGNOSIS — Z87891 Personal history of nicotine dependence: Secondary | ICD-10-CM | POA: Diagnosis not present

## 2018-10-02 DIAGNOSIS — Z952 Presence of prosthetic heart valve: Secondary | ICD-10-CM | POA: Diagnosis not present

## 2018-10-02 DIAGNOSIS — J449 Chronic obstructive pulmonary disease, unspecified: Secondary | ICD-10-CM | POA: Insufficient documentation

## 2018-10-02 DIAGNOSIS — E039 Hypothyroidism, unspecified: Secondary | ICD-10-CM | POA: Diagnosis not present

## 2018-10-02 DIAGNOSIS — I509 Heart failure, unspecified: Secondary | ICD-10-CM | POA: Insufficient documentation

## 2018-10-02 DIAGNOSIS — R410 Disorientation, unspecified: Secondary | ICD-10-CM | POA: Diagnosis not present

## 2018-10-02 DIAGNOSIS — I4891 Unspecified atrial fibrillation: Secondary | ICD-10-CM | POA: Insufficient documentation

## 2018-10-02 DIAGNOSIS — R42 Dizziness and giddiness: Secondary | ICD-10-CM | POA: Insufficient documentation

## 2018-10-02 DIAGNOSIS — N390 Urinary tract infection, site not specified: Secondary | ICD-10-CM

## 2018-10-02 DIAGNOSIS — R319 Hematuria, unspecified: Secondary | ICD-10-CM | POA: Diagnosis not present

## 2018-10-02 DIAGNOSIS — N3001 Acute cystitis with hematuria: Secondary | ICD-10-CM | POA: Insufficient documentation

## 2018-10-02 DIAGNOSIS — Z79899 Other long term (current) drug therapy: Secondary | ICD-10-CM | POA: Insufficient documentation

## 2018-10-02 DIAGNOSIS — Z7901 Long term (current) use of anticoagulants: Secondary | ICD-10-CM | POA: Insufficient documentation

## 2018-10-02 DIAGNOSIS — E782 Mixed hyperlipidemia: Secondary | ICD-10-CM | POA: Insufficient documentation

## 2018-10-02 DIAGNOSIS — E1140 Type 2 diabetes mellitus with diabetic neuropathy, unspecified: Secondary | ICD-10-CM | POA: Diagnosis not present

## 2018-10-02 DIAGNOSIS — R531 Weakness: Secondary | ICD-10-CM | POA: Diagnosis not present

## 2018-10-02 DIAGNOSIS — I110 Hypertensive heart disease with heart failure: Secondary | ICD-10-CM | POA: Diagnosis not present

## 2018-10-02 DIAGNOSIS — Z794 Long term (current) use of insulin: Secondary | ICD-10-CM | POA: Diagnosis not present

## 2018-10-02 DIAGNOSIS — Z888 Allergy status to other drugs, medicaments and biological substances status: Secondary | ICD-10-CM | POA: Diagnosis not present

## 2018-10-02 LAB — URINALYSIS, ROUTINE W REFLEX MICROSCOPIC
Bilirubin Urine: NEGATIVE
Glucose, UA: >=500 mg/dL — AB
Hgb urine dipstick: NEGATIVE
Ketones, ur: NEGATIVE mg/dL
Nitrite: NEGATIVE
Protein, ur: NEGATIVE mg/dL
Specific Gravity, Urine: 1.005 (ref 1.005–1.030)
pH: 6 (ref 5.0–8.0)

## 2018-10-02 LAB — BASIC METABOLIC PANEL
Anion gap: 8 (ref 5–15)
BUN: 18 mg/dL (ref 8–23)
CO2: 28 mmol/L (ref 22–32)
Calcium: 9 mg/dL (ref 8.9–10.3)
Chloride: 100 mmol/L (ref 98–111)
Creatinine, Ser: 1.08 mg/dL (ref 0.61–1.24)
GFR calc Af Amer: >60 mL/min (ref >60)
GFR calc non Af Amer: >60 mL/min (ref >60)
Glucose, Bld: 288 mg/dL — ABNORMAL HIGH (ref 70–99)
Potassium: 4.3 mmol/L (ref 3.5–5.1)
Sodium: 136 mmol/L (ref 135–145)

## 2018-10-02 LAB — CBC WITH DIFFERENTIAL/PLATELET
Abs Immature Granulocytes: 0.03 10*3/uL (ref 0.00–0.07)
Basophils Absolute: 0 10*3/uL (ref 0.0–0.1)
Basophils Relative: 1 %
Eosinophils Absolute: 0.2 10*3/uL (ref 0.0–0.5)
Eosinophils Relative: 3 %
HCT: 38.5 % — ABNORMAL LOW (ref 39.0–52.0)
Hemoglobin: 12.8 g/dL — ABNORMAL LOW (ref 13.0–17.0)
Immature Granulocytes: 1 %
Lymphocytes Relative: 23 %
Lymphs Abs: 1.5 10*3/uL (ref 0.7–4.0)
MCH: 33.6 pg (ref 26.0–34.0)
MCHC: 33.2 g/dL (ref 30.0–36.0)
MCV: 101 fL — ABNORMAL HIGH (ref 80.0–100.0)
Monocytes Absolute: 0.5 10*3/uL (ref 0.1–1.0)
Monocytes Relative: 8 %
Neutro Abs: 4.2 10*3/uL (ref 1.7–7.7)
Neutrophils Relative %: 64 %
Platelets: 105 10*3/uL — ABNORMAL LOW (ref 150–400)
RBC: 3.81 MIL/uL — ABNORMAL LOW (ref 4.22–5.81)
RDW: 14.2 % (ref 11.5–15.5)
WBC: 6.4 10*3/uL (ref 4.0–10.5)
nRBC: 0 % (ref 0.0–0.2)

## 2018-10-02 LAB — PROTIME-INR
INR: 1.4 — ABNORMAL HIGH (ref 0.8–1.2)
Prothrombin Time: 16.6 seconds — ABNORMAL HIGH (ref 11.4–15.2)

## 2018-10-02 LAB — CBG MONITORING, ED: Glucose-Capillary: 284 mg/dL — ABNORMAL HIGH (ref 70–99)

## 2018-10-02 MED ORDER — SODIUM CHLORIDE 0.9 % IV SOLN
1.0000 g | Freq: Once | INTRAVENOUS | Status: AC
  Administered 2018-10-02: 1 g via INTRAVENOUS
  Filled 2018-10-02: qty 10

## 2018-10-02 MED ORDER — CEPHALEXIN 500 MG PO CAPS: 500.0000 mg | ORAL_CAPSULE | Freq: Four times a day (QID) | ORAL | 0 refills | Status: DC

## 2018-10-02 NOTE — ED Triage Notes (Signed)
Patient states he feels "disoriented and out of sorts". Patient states weakness and dizziness when he woke up at 6 am. Patient states he went to bed at 8pm last night.

## 2018-10-02 NOTE — ED Provider Notes (Signed)
[facility] EMERGENCY DEPARTMENT Provider Note   CSN: [id number] Arrival date & time: 10/02/18  1012     History   Chief Complaint Chief Complaint  Patient presents with  . Weakness  . Dizziness    HPI [name] is a 73 y.o. male.     Patient complains of weakness.  The history is provided by the patient. No language interpreter was used.  Weakness Severity:  Mild Onset quality:  Gradual Timing:  Constant Progression:  Waxing and waning Chronicity:  New Context: not alcohol use   Relieved by:  Nothing Worsened by:  Nothing Associated symptoms: dizziness   Associated symptoms: no abdominal pain, no chest pain, no cough, no diarrhea, no frequency, no headaches and no seizures   Dizziness Associated symptoms: weakness   Associated symptoms: no chest pain, no diarrhea and no headaches     Past Medical History:  Diagnosis Date  . Arthritis   . Atrial fibrillation ([place])   . BPH (benign prostatic hypertrophy) 04/15/2010  . Cataract   . Colon polyps   . DEPRESSION 01/22/2009  . Heart valve replaced   . HYPERCHOLESTEROLEMIA 01/22/2009  . HYPERTENSION 01/22/2009   Dr. [name]  . 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 Morbid obesity ([place])   . Tremors of nervous system    ?ptsd  . Ulcer     Patient Active Problem List   Diagnosis Date Noted  . COPD (chronic obstructive pulmonary disease) ([place]) 06/25/2018  . Thrombocytopenia ([place]) 05/09/2018  . Diabetic neuropathy ([place]) 01/30/2018  . S/P AVR (aortic valve replacement) 11/04/2016  . Atrial fibrillation ([place]) 09/27/2016  . Symptomatic bradycardia 06/12/2016  . Urgency incontinence 11/10/2015  . Erectile dysfunction 11/10/2015  . Groin pain   . Hypothyroidism 01/15/2015  . 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 Phimosis 01/15/2015  . CHF (congestive heart failure) ([place]) 01/01/2015  . B12 deficiency 04/08/2014  . Lateral meniscal tear   . 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 Type 2  diabetes mellitus with diabetic neuropathy, unspecified ([place]) 03/12/2014  . 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 GERD (gastroesophageal reflux disease) 11/02/2012  . Anemia 04/02/2012  . Obesity 08/04/2010  . Benign prostatic hyperplasia 04/15/2010  . CAROTID OCCLUSIVE DISEASE 01/26/2010  . MURMUR 05/27/2009  . Aortic valve disease 05/27/2009  . Hyperlipidemia with target LDL less than 100 01/22/2009  . Depression 01/22/2009  . Essential hypertension 01/22/2009    Past Surgical History:  Procedure Laterality Date  . 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 APPENDECTOMY    . bilateral cataract surg    . COLONOSCOPY  04/24/2007   [name]: normal  . COLONOSCOPY WITH ESOPHAGOGASTRODUODENOSCOPY (EGD) N/A 04/05/2012   Procedure: COLONOSCOPY WITH ESOPHAGOGASTRODUODENOSCOPY (EGD);  Surgeon: Daneil Dolin, MD;  Location: [facility] END[facility];  Service: Endoscopy;  Laterality: N/A;  10;15  . DENTAL SURGERY  05/2004   Dental extractions  . EYE SURGERY Bilateral    cataract  . HEEL SPUR SURGERY Bilateral    resection of heel spur  . KNEE ARTHROSCOPY WITH LATERAL MENISECTOMY Right 04/04/2014   Procedure: KNEE ARTHROSCOPY WITH LATERAL MENISECTOMY;  Surgeon: Carole Civil, MD;  Location: [facility] ORS;  Service: Orthopedics;  Laterality: Right;  . PACEMAKER IMPLANT N/A 06/13/2016   Procedure: Pacemaker Implant- Dual Chamber;  Surgeon: Evans Lance, MD;  Location: [place] CV LAB;  Service: Cardiovascular;  Laterality: N/A;  . PACEMAKER INSERTION  2018  . THYROIDECTOMY  03/21/2013  DR [name]  . THYROIDECTOMY N/A 03/21/2013   Procedure: THYROIDECTOMY;  Surgeon: [name], MD;  Location: [place];  Service: General;  Laterality: N/A;  . TONSILLECTOMY          Home Medications    Prior to Admission medications   Medication Sig Start Date End Date Taking? Authorizing Provider  acetaminophen (TYLENOL) 500 MG tablet Take 1,000 mg by mouth every 6 (six)  hours as needed (pain).   Yes [provider]  cholecalciferol (VITAMIN D) 1000 units tablet Take 1,000 Units by mouth daily.    Yes [provider]  ELIQUIS 5 MG TABS tablet TAKE ONE TABLET BY MOUTH TWICE DAILY 11/28/17  Yes Dettinger, Fransisca Kaufmann, MD  fluticasone (FLONASE) 50 MCG/ACT nasal spray Place 2 sprays into both nostrils daily. 11/14/17  Yes Hawks, Christy A, FNP  furosemide (LASIX) 20 MG tablet Take 1 tablet (20 mg total) by mouth daily. 04/17/17  Yes Timmothy Euler, MD  gabapentin (NEURONTIN) 300 MG capsule Take 1 capsule (300 mg total) by mouth 2 (two) times daily. Patient taking differently: Take 300 mg by mouth daily.  01/30/18  Yes Dettinger, Fransisca Kaufmann, MD  glucose blood test strip 1 strip by Does not apply route 3 (three) times daily. Uses true metrix strips (diabetic club) 05/12/16  Yes [provider]  insulin detemir (LEVEMIR) 100 UNIT/ML injection Inject 0.47-0.48 mLs (47-48 Units total) into the skin 2 (two) times daily. Patient taking differently: Inject 42 Units into the skin 2 (two) times daily.  07/04/16  Yes Cherre Robins, PharmD  Insulin Pen Needle (B-D ULTRAFINE III SHORT PEN) 31G X 8 MM MISC Use to inject Victoza qd 11/20/15  Yes Timmothy Euler, MD  levothyroxine (SYNTHROID, LEVOTHROID) 200 MCG tablet Take 1 tablet (200 mcg total) by mouth daily before breakfast. 04/17/17  Yes Timmothy Euler, MD  metFORMIN (GLUCOPHAGE) 1000 MG tablet TAKE 1 TABLET BY MOUTH TWICE DAILY WITH A MEAL. 07/24/15  Yes Timmothy Euler, MD  Multiple Vitamins-Minerals (MENS MULTI VITAMIN & MINERAL PO) Take 1 tablet by mouth daily.     Yes [provider]  Omega-3 Fatty Acids (FISH OIL) 1200 MG CAPS Take 1,200-2,400 mg by mouth 2 (two) times daily. Take 1 capsule (1200 mg) by mouth daily at noon and 2 capsules (2400 mg) daily at bedtime   Yes [provider]  simvastatin (ZOCOR) 80 MG tablet Take 0.5 tablets (40 mg total) by mouth at bedtime. 04/04/17   Yes Timmothy Euler, MD  tamsulosin (FLOMAX) 0.4 MG CAPS capsule TAKE TWO CAPSULES BY MOUTH AT BEDTIME 09/12/18  Yes Dettinger, Fransisca Kaufmann, MD  cephALEXin (KEFLEX) 500 MG capsule Take 1 capsule (500 mg total) by mouth 4 (four) times daily. 10/02/18   Milton Ferguson, MD  ciprofloxacin (CIPRO) 500 MG tablet Take 1 tablet (500 mg total) by mouth 2 (two) times daily. Patient not taking: Reported on 10/02/2018 09/10/18   Dettinger, Fransisca Kaufmann, MD  Semaglutide (OZEMPIC) 0.25 or 0.5 MG/DOSE SOPN Inject into the skin.    [provider]    Family History Family History  Problem Relation Age of Onset  . Heart disease Father   . Congestive Heart Failure Father   . Arthritis Father   . Diabetes Other   . Benign prostatic hyperplasia Brother     Social History Social History   Tobacco Use  . Smoking status: Former Smoker    Packs/day: 0.00    Years: 12.00  Pack years: 0.00    Types: Cigarettes    Quit date: 09/20/1961    Years since quitting: 57.0  . Smokeless tobacco: Never Used  Substance Use Topics  . Alcohol use: No  . Drug use: No     Allergies   Phenothiazines and Actos [pioglitazone]   Review of Systems Review of Systems  Constitutional: Negative for appetite change and fatigue.  HENT: Negative for congestion, ear discharge and sinus pressure.   Eyes: Negative for discharge.  Respiratory: Negative for cough.   Cardiovascular: Negative for chest pain.  Gastrointestinal: Negative for abdominal pain and diarrhea.  Genitourinary: Negative for frequency and hematuria.  Musculoskeletal: Negative for back pain.  Skin: Negative for rash.  Neurological: Positive for dizziness and weakness. Negative for seizures and headaches.  Psychiatric/Behavioral: Negative for hallucinations.     Physical Exam Updated Vital Signs BP (!) 163/88   Pulse 71   Temp 98.2 F (36.8 C) (Oral)   Resp 16   Ht 6\' 1"  (1.854 m)   Wt 120.7 kg   SpO2 98%   BMI 35.09 kg/m   Physical Exam  Vitals signs and nursing note reviewed.  Constitutional:      Appearance: He is well-developed.  HENT:     Head: Normocephalic.     Nose: Nose normal.  Eyes:     General: No scleral icterus.    Conjunctiva/sclera: Conjunctivae normal.  Neck:     Musculoskeletal: Neck supple.     Thyroid: No thyromegaly.  Cardiovascular:     Rate and Rhythm: Normal rate and regular rhythm.     Heart sounds: No murmur. No friction rub. No gallop.   Pulmonary:     Breath sounds: No stridor. No wheezing or rales.  Chest:     Chest wall: No tenderness.  Abdominal:     General: There is no distension.     Tenderness: There is no abdominal tenderness. There is no rebound.  Musculoskeletal: Normal range of motion.  Lymphadenopathy:     Cervical: No cervical adenopathy.  Skin:    Findings: No erythema or rash.  Neurological:     Mental Status: He is oriented to person, place, and time.     Motor: No abnormal muscle tone.     Coordination: Coordination normal.  Psychiatric:        Behavior: Behavior normal.      ED Treatments / Results  Labs (all labs ordered are listed, but only abnormal results are displayed) Labs Reviewed  BASIC METABOLIC PANEL - Abnormal; Notable for the following components:      Result Value   Glucose, Bld 288 (*)    All other components within normal limits  URINALYSIS, ROUTINE W REFLEX MICROSCOPIC - Abnormal; Notable for the following components:   APPearance HAZY (*)    Glucose, UA >=500 (*)    Leukocytes,Ua MODERATE (*)    Bacteria, UA MANY (*)    All other components within normal limits  PROTIME-INR - Abnormal; Notable for the following components:   Prothrombin Time 16.6 (*)    INR 1.4 (*)    All other components within normal limits  CBC WITH DIFFERENTIAL/PLATELET - Abnormal; Notable for the following components:   RBC 3.81 (*)    Hemoglobin 12.8 (*)    HCT 38.5 (*)    MCV 101.0 (*)    Platelets 105 (*)    All other components within normal limits   CBG MONITORING, ED - Abnormal; Notable for the following components:  Glucose-Capillary 284 (*)    All other components within normal limits  URINE CULTURE    EKG None  Radiology Ct Head Wo Contrast  Result Date: 10/02/2018 CLINICAL DATA:  Dizziness and disorientation EXAM: CT HEAD WITHOUT CONTRAST TECHNIQUE: Contiguous axial images were obtained from the base of the skull through the vertex without intravenous contrast. COMPARISON:  September 15, 2018 and May 30, 2017 FINDINGS: Brain: There is mild diffuse atrophy, stable. Diffuse enlargement of the cisterna magna is a stable anatomic variant. There is no intracranial mass, hemorrhage, extra-axial fluid collection, or midline shift. There is patchy small vessel disease in the centra semiovale bilaterally, stable. No acute infarct evident. Vascular: No hyperdense vessel. There are foci of calcification in each carotid siphon region. Skull: Bony calvarium appears intact. Sinuses/Orbits: There is opacification in each sphenoid sinus as well as mucosal thickening and opacification in several ethmoid air cells. There is mucosal thickening in each medial maxillary antrum as well. Orbits appear symmetric bilaterally. Other: Mastoid air cells are clear. IMPRESSION: Stable atrophy with periventricular small vessel disease. No acute infarct. No mass or hemorrhage evident. There are foci of arterial vascular calcification. There is multifocal paranasal sinus disease. Electronically Signed   By: Lowella Grip III M.D.   On: 10/02/2018 11:41    Procedures Procedures (including critical care time)  Medications Ordered in ED Medications  cefTRIAXone (ROCEPHIN) 1 g in sodium chloride 0.9 % 100 mL IVPB (1 g Intravenous New Bag/Given 10/02/18 1323)     Initial Impression / Assessment and Plan / ED Course  I have reviewed the triage vital signs and the nursing notes.  Pertinent labs & imaging results that were available during my care of the patient were  reviewed by me and considered in my medical decision making (see chart for details).        Labs and CT scan unremarkable except for urinary tract infection.  Patient is given some Rocephin IV and was sent home on Keflex and will follow-up in 2 to 3 days with his primary care  Final Clinical Impressions(s) / ED Diagnoses   Final diagnoses:  Urinary tract infection with hematuria, site unspecified    ED Discharge Orders         Ordered    cephALEXin (KEFLEX) 500 MG capsule  4 times daily     10/02/18 1330           Milton Ferguson, MD 10/02/18 1338

## 2018-10-02 NOTE — Discharge Instructions (Addendum)
Drink plenty of fluids.  Return if developing high fever or vomiting.  Follow-up with your doctor in 2 to 3 days.  Get your antibiotics today

## 2018-10-02 NOTE — ED Notes (Signed)
EDP at bedside updating patient. 

## 2018-10-03 LAB — URINE CULTURE: Culture: >=100000 — AB

## 2018-10-04 ENCOUNTER — Encounter: Payer: Self-pay | Admitting: Family Medicine

## 2018-10-04 ENCOUNTER — Ambulatory Visit (INDEPENDENT_AMBULATORY_CARE_PROVIDER_SITE_OTHER): Payer: Medicare Other | Admitting: Family Medicine

## 2018-10-04 ENCOUNTER — Telehealth: Payer: Self-pay | Admitting: *Deleted

## 2018-10-04 DIAGNOSIS — N390 Urinary tract infection, site not specified: Secondary | ICD-10-CM

## 2018-10-04 NOTE — Progress Notes (Signed)
Virtual Visit via telephone Note  I connected with [name] on 10/04/18 at 0945 by telephone and verified that I am speaking with the correct person using two identifiers. [name] is currently located at home and no other people are currently with her during visit. The provider, Fransisca Kaufmann Dettinger, MD is located in their office at time of visit.  Call ended at [phone]  I discussed the limitations, risks, security and privacy concerns of performing an evaluation and management service by telephone and the availability of in person appointments. I also discussed with the patient that there may be a patient responsible charge related to this service. The patient expressed understanding and agreed to proceed.   History and Present Illness: Patient is currently taking keflex for his UTI and he was having weakness 2 days and went into the ED 2 days ago and he is doing a lot better. He is back to urinating normally and the ED culture is showing that his antibiotic should work well.  No diagnosis found.  Outpatient Encounter Medications as of 10/04/2018  Medication Sig  . acetaminophen (TYLENOL) 500 MG tablet Take 1,000 mg by mouth every 6 (six) hours as needed (pain).  . cephALEXin (KEFLEX) 500 MG capsule Take 1 capsule (500 mg total) by mouth 4 (four) times daily.  . cholecalciferol (VITAMIN D) 1000 units tablet Take 1,000 Units by mouth daily.   . ciprofloxacin (CIPRO) 500 MG tablet Take 1 tablet (500 mg total) by mouth 2 (two) times daily. (Patient not taking: Reported on 10/02/2018)  . ELIQUIS 5 MG TABS tablet TAKE ONE TABLET BY MOUTH TWICE DAILY  . fluticasone (FLONASE) 50 MCG/ACT nasal spray Place 2 sprays into both nostrils daily.  . furosemide (LASIX) 20 MG tablet Take 1 tablet (20 mg total) by mouth daily.  Marland Kitchen gabapentin (NEURONTIN) 300 MG capsule Take 1 capsule (300 mg total) by mouth 2 (two) times daily. (Patient taking differently: Take 300 mg by mouth daily. )  . glucose  blood test strip 1 strip by Does not apply route 3 (three) times daily. Uses true metrix strips (diabetic club)  . insulin detemir (LEVEMIR) 100 UNIT/ML injection Inject 0.47-0.48 mLs (47-48 Units total) into the skin 2 (two) times daily. (Patient taking differently: Inject 42 Units into the skin 2 (two) times daily. )  . Insulin Pen Needle (B-D ULTRAFINE III SHORT PEN) 31G X 8 MM MISC Use to inject Victoza qd  . levothyroxine (SYNTHROID, LEVOTHROID) 200 MCG tablet Take 1 tablet (200 mcg total) by mouth daily before breakfast.  . metFORMIN (GLUCOPHAGE) 1000 MG tablet TAKE 1 TABLET BY MOUTH TWICE DAILY WITH A MEAL.  . Multiple Vitamins-Minerals (MENS MULTI VITAMIN & MINERAL PO) Take 1 tablet by mouth daily.    . Omega-3 Fatty Acids (FISH OIL) 1200 MG CAPS Take 1,200-2,400 mg by mouth 2 (two) times daily. Take 1 capsule (1200 mg) by mouth daily at noon and 2 capsules (2400 mg) daily at bedtime  . Semaglutide (OZEMPIC) 0.25 or 0.5 MG/DOSE SOPN Inject into the skin.  Marland Kitchen simvastatin (ZOCOR) 80 MG tablet Take 0.5 tablets (40 mg total) by mouth at bedtime.  . tamsulosin (FLOMAX) 0.4 MG CAPS capsule TAKE TWO CAPSULES BY MOUTH AT BEDTIME   No facility-administered encounter medications on file as of 10/04/2018.     Review of Systems  Constitutional: Negative for chills and fever.  Respiratory: Negative for shortness of breath and wheezing.   Cardiovascular: Negative for chest pain and leg  swelling.  Gastrointestinal: Negative for abdominal pain.  Genitourinary: Negative for decreased urine volume, dysuria, hematuria and urgency.  Musculoskeletal: Negative for back pain and gait problem.  Skin: Negative for rash.  Neurological: Positive for weakness.  All other systems reviewed and are negative.   Observations/Objective: Patient sounds comfortable and in no acute distress.  Assessment and Plan: Problem List Items Addressed This Visit    None    Visit Diagnoses    Recurrent UTI    -  Primary       Patient was treated with Keflex in the emergency department and looks like culture grew a bacteria that should be sensitive to it, patient is feeling a lot better, continue current medication Follow Up Instructions: Follow up in 1-2 months    I discussed the assessment and treatment plan with the patient. The patient was provided an opportunity to ask questions and all were answered. The patient agreed with the plan and demonstrated an understanding of the instructions.   The patient was advised to call back or seek an in-person evaluation if the symptoms worsen or if the condition fails to improve as anticipated.  The above assessment and management plan was discussed with the patient. The patient verbalized understanding of and has agreed to the management plan. Patient is aware to call the clinic if symptoms persist or worsen. Patient is aware when to return to the clinic for a follow-up visit. Patient educated on when it is appropriate to go to the emergency department.    I provided 10 minutes of non-face-to-face time during this encounter.    [name], MD

## 2018-10-04 NOTE — Telephone Encounter (Signed)
Post ED Visit - Positive Culture Follow-up  Culture report reviewed by antimicrobial stewardship pharmacist: [place] Team []  Elenor Quinones, Pharm.D. []  Heide Guile, Pharm.D., BCPS AQ-ID []  Parks Neptune, Pharm.D., BCPS []  Alycia Rossetti, Pharm.D., BCPS []  [place], Pharm.D., BCPS, AAHIVP []  Legrand Como, Pharm.D., BCPS, AAHIVP []  Salome Arnt, PharmD, BCPS []  Johnnette Gourd, PharmD, BCPS []  Hughes Better, PharmD, BCPS []  Leeroy Cha, PharmD []  Laqueta Linden, PharmD, BCPS []  Albertina Parr, PharmD  [place] Team []  Leodis Sias, PharmD []  Lindell Spar, PharmD []  Royetta Asal, PharmD []  Graylin Shiver, Rph []  Rema Fendt) Glennon Mac, PharmD []  Arlyn Dunning, PharmD []  Netta Cedars, PharmD []  Dia Sitter, PharmD []  Leone Haven, PharmD []  Gretta Arab, PharmD []  Theodis Shove, PharmD []  Peggyann Juba, PharmD []  Reuel Boom, PharmD   Positive urine culture, reviewed by Raquel Sarna, PharmD Treated with Cephalexin, organism sensitive to the same and no further patient follow-up is required at this time.  Harlon Flor [facility] 10/04/2018, 12:09 PM

## 2018-10-05 ENCOUNTER — Ambulatory Visit (INDEPENDENT_AMBULATORY_CARE_PROVIDER_SITE_OTHER): Payer: Medicare Other | Admitting: *Deleted

## 2018-10-05 DIAGNOSIS — I4891 Unspecified atrial fibrillation: Secondary | ICD-10-CM | POA: Diagnosis not present

## 2018-10-05 DIAGNOSIS — I509 Heart failure, unspecified: Secondary | ICD-10-CM

## 2018-10-05 LAB — CUP PACEART REMOTE DEVICE CHECK
Battery Remaining Longevity: 108 mo
Battery Voltage: 3 V
Brady Statistic AP VP Percent: 2.36 %
Brady Statistic AP VS Percent: 0 %
Brady Statistic AS VP Percent: 97.57 %
Brady Statistic AS VS Percent: 0.06 %
Brady Statistic RA Percent Paced: 2.35 %
Brady Statistic RV Percent Paced: 99.94 %
Date Time Interrogation Session: 20200911040454
Implantable Lead Implant Date: 20180521
Implantable Lead Implant Date: 20180521
Implantable Lead Location: 753859
Implantable Lead Location: 753859
Implantable Lead Model: 3830
Implantable Lead Model: 5076
Implantable Pulse Generator Implant Date: 20180521
Lead Channel Impedance Value: 304 Ohm
Lead Channel Impedance Value: 323 Ohm
Lead Channel Impedance Value: 418 Ohm
Lead Channel Impedance Value: 437 Ohm
Lead Channel Pacing Threshold Amplitude: 0.875 V
Lead Channel Pacing Threshold Amplitude: 0.875 V
Lead Channel Pacing Threshold Pulse Width: 0.4 ms
Lead Channel Pacing Threshold Pulse Width: 0.4 ms
Lead Channel Sensing Intrinsic Amplitude: 0.75 mV
Lead Channel Sensing Intrinsic Amplitude: 0.75 mV
Lead Channel Sensing Intrinsic Amplitude: 19.625 mV
Lead Channel Sensing Intrinsic Amplitude: 19.625 mV
Lead Channel Setting Pacing Amplitude: 1.75 V
Lead Channel Setting Pacing Amplitude: 2.5 V
Lead Channel Setting Pacing Pulse Width: 0.4 ms
Lead Channel Setting Sensing Sensitivity: 2 mV

## 2018-10-08 DIAGNOSIS — R8290 Unspecified abnormal findings in urine: Secondary | ICD-10-CM | POA: Diagnosis not present

## 2018-10-08 DIAGNOSIS — N390 Urinary tract infection, site not specified: Secondary | ICD-10-CM | POA: Diagnosis not present

## 2018-10-08 DIAGNOSIS — E538 Deficiency of other specified B group vitamins: Secondary | ICD-10-CM | POA: Diagnosis not present

## 2018-10-08 DIAGNOSIS — R531 Weakness: Secondary | ICD-10-CM | POA: Diagnosis not present

## 2018-10-08 DIAGNOSIS — E1169 Type 2 diabetes mellitus with other specified complication: Secondary | ICD-10-CM | POA: Diagnosis not present

## 2018-10-09 ENCOUNTER — Ambulatory Visit: Payer: Medicare Other | Admitting: Family Medicine

## 2018-10-09 ENCOUNTER — Telehealth: Payer: Self-pay | Admitting: Family Medicine

## 2018-10-09 NOTE — Telephone Encounter (Signed)
We can review these notes but patient will have to schedule another visit to discuss this, he was supposed to come in today and did not, please get him scheduled for another visit as soon as he can, if he has respiratory symptoms than it will have to be a virtual visit

## 2018-10-09 NOTE — Telephone Encounter (Signed)
Patient states that he had to go to urgent care yesterday.  Notes in Care everywhere.  Patient would like for Dr. Warrick Parisian to review notes from urgent care

## 2018-10-10 ENCOUNTER — Ambulatory Visit: Payer: Medicare Other | Admitting: Family Medicine

## 2018-10-11 ENCOUNTER — Encounter (HOSPITAL_COMMUNITY): Payer: Self-pay | Admitting: Emergency Medicine

## 2018-10-11 ENCOUNTER — Emergency Department (HOSPITAL_COMMUNITY): Payer: No Typology Code available for payment source

## 2018-10-11 ENCOUNTER — Ambulatory Visit (INDEPENDENT_AMBULATORY_CARE_PROVIDER_SITE_OTHER): Payer: Medicare Other | Admitting: Family Medicine

## 2018-10-11 ENCOUNTER — Telehealth: Payer: Self-pay | Admitting: Family Medicine

## 2018-10-11 ENCOUNTER — Other Ambulatory Visit: Payer: Self-pay

## 2018-10-11 ENCOUNTER — Emergency Department (HOSPITAL_COMMUNITY)
Admission: EM | Admit: 2018-10-11 | Discharge: 2018-10-11 | Disposition: A | Discharge status: Home / Self Care | Payer: No Typology Code available for payment source | Attending: Emergency Medicine | Admitting: Emergency Medicine

## 2018-10-11 DIAGNOSIS — E119 Type 2 diabetes mellitus without complications: Secondary | ICD-10-CM | POA: Insufficient documentation

## 2018-10-11 DIAGNOSIS — R591 Generalized enlarged lymph nodes: Secondary | ICD-10-CM | POA: Diagnosis not present

## 2018-10-11 DIAGNOSIS — R590 Localized enlarged lymph nodes: Secondary | ICD-10-CM | POA: Diagnosis not present

## 2018-10-11 DIAGNOSIS — Z7901 Long term (current) use of anticoagulants: Secondary | ICD-10-CM | POA: Diagnosis not present

## 2018-10-11 DIAGNOSIS — I110 Hypertensive heart disease with heart failure: Secondary | ICD-10-CM | POA: Insufficient documentation

## 2018-10-11 DIAGNOSIS — Z950 Presence of cardiac pacemaker: Secondary | ICD-10-CM | POA: Diagnosis not present

## 2018-10-11 DIAGNOSIS — R1031 Right lower quadrant pain: Secondary | ICD-10-CM | POA: Insufficient documentation

## 2018-10-11 DIAGNOSIS — J449 Chronic obstructive pulmonary disease, unspecified: Secondary | ICD-10-CM | POA: Diagnosis not present

## 2018-10-11 DIAGNOSIS — R6883 Chills (without fever): Secondary | ICD-10-CM | POA: Insufficient documentation

## 2018-10-11 DIAGNOSIS — I509 Heart failure, unspecified: Secondary | ICD-10-CM | POA: Diagnosis not present

## 2018-10-11 DIAGNOSIS — Z87891 Personal history of nicotine dependence: Secondary | ICD-10-CM | POA: Diagnosis not present

## 2018-10-11 DIAGNOSIS — Z79899 Other long term (current) drug therapy: Secondary | ICD-10-CM | POA: Insufficient documentation

## 2018-10-11 DIAGNOSIS — E890 Postprocedural hypothyroidism: Secondary | ICD-10-CM | POA: Diagnosis not present

## 2018-10-11 DIAGNOSIS — Z794 Long term (current) use of insulin: Secondary | ICD-10-CM | POA: Insufficient documentation

## 2018-10-11 DIAGNOSIS — Z5329 Procedure and treatment not carried out because of patient's decision for other reasons: Secondary | ICD-10-CM

## 2018-10-11 DIAGNOSIS — R531 Weakness: Secondary | ICD-10-CM | POA: Diagnosis not present

## 2018-10-11 LAB — URINALYSIS, ROUTINE W REFLEX MICROSCOPIC
Bilirubin Urine: NEGATIVE
Glucose, UA: NEGATIVE mg/dL
Hgb urine dipstick: NEGATIVE
Ketones, ur: 5 mg/dL — AB
Leukocytes,Ua: NEGATIVE
Nitrite: NEGATIVE
Protein, ur: NEGATIVE mg/dL
Specific Gravity, Urine: 1.01 (ref 1.005–1.030)
pH: 6 (ref 5.0–8.0)

## 2018-10-11 LAB — COMPREHENSIVE METABOLIC PANEL
ALT: 29 U/L (ref 0–44)
AST: 34 U/L (ref 15–41)
Albumin: 3.7 g/dL (ref 3.5–5.0)
Alkaline Phosphatase: 54 U/L (ref 38–126)
Anion gap: 10 (ref 5–15)
BUN: 16 mg/dL (ref 8–23)
CO2: 25 mmol/L (ref 22–32)
Calcium: 8.6 mg/dL — ABNORMAL LOW (ref 8.9–10.3)
Chloride: 95 mmol/L — ABNORMAL LOW (ref 98–111)
Creatinine, Ser: 1.21 mg/dL (ref 0.61–1.24)
GFR calc Af Amer: >60 mL/min (ref >60)
GFR calc non Af Amer: 58 mL/min — ABNORMAL LOW (ref >60)
Glucose, Bld: 184 mg/dL — ABNORMAL HIGH (ref 70–99)
Potassium: 4.1 mmol/L (ref 3.5–5.1)
Sodium: 130 mmol/L — ABNORMAL LOW (ref 135–145)
Total Bilirubin: 0.3 mg/dL (ref 0.3–1.2)
Total Protein: 6.9 g/dL (ref 6.5–8.1)

## 2018-10-11 LAB — CBC WITH DIFFERENTIAL/PLATELET
Abs Immature Granulocytes: 0.03 10*3/uL (ref 0.00–0.07)
Basophils Absolute: 0 10*3/uL (ref 0.0–0.1)
Basophils Relative: 0 %
Eosinophils Absolute: 0.1 10*3/uL (ref 0.0–0.5)
Eosinophils Relative: 1 %
HCT: 40.2 % (ref 39.0–52.0)
Hemoglobin: 13.6 g/dL (ref 13.0–17.0)
Immature Granulocytes: 1 %
Lymphocytes Relative: 17 %
Lymphs Abs: 0.9 10*3/uL (ref 0.7–4.0)
MCH: 33.7 pg (ref 26.0–34.0)
MCHC: 33.8 g/dL (ref 30.0–36.0)
MCV: 99.5 fL (ref 80.0–100.0)
Monocytes Absolute: 0.9 10*3/uL (ref 0.1–1.0)
Monocytes Relative: 16 %
Neutro Abs: 3.5 10*3/uL (ref 1.7–7.7)
Neutrophils Relative %: 65 %
Platelets: 90 10*3/uL — ABNORMAL LOW (ref 150–400)
RBC: 4.04 MIL/uL — ABNORMAL LOW (ref 4.22–5.81)
RDW: 13.8 % (ref 11.5–15.5)
WBC: 5.4 10*3/uL (ref 4.0–10.5)
nRBC: 0 % (ref 0.0–0.2)

## 2018-10-11 LAB — CBG MONITORING, ED: Glucose-Capillary: 179 mg/dL — ABNORMAL HIGH (ref 70–99)

## 2018-10-11 NOTE — ED Notes (Signed)
Called lab in regards to delay in lab draws. Will sent phlebotomy ASAP.

## 2018-10-11 NOTE — Telephone Encounter (Signed)
Patient went to ER for chills.  Vitals signs were all normal, no fever,  CT scan was performed and found to have enlarged right inguinal lymph nodes.  Was told to follow up with PCP.  Appointment scheduled for 10/15/2018 with Dr. Warrick Parisian.

## 2018-10-11 NOTE — ED Notes (Signed)
Patient transported to CT 

## 2018-10-11 NOTE — ED Notes (Signed)
EDP at bedside  

## 2018-10-11 NOTE — Progress Notes (Signed)
Telephone visit  Attempted to reach [name] several times with no answer and no voicemail.   Start time: 8:31am (no answer); 8:33am (no answer); 8:39am (no answer)

## 2018-10-11 NOTE — ED Triage Notes (Signed)
Pt called EMS for chills. Pt states he recently started a new antibiotic, but pt is unsure why he is taking it. Pt states chills began 3 days ago. Denies fever, cough, or SOB.

## 2018-10-11 NOTE — ED Notes (Signed)
Pt aware a urine specimen is needed and will notify staff when one can be obtained.

## 2018-10-11 NOTE — ED Notes (Signed)
Lab to draw blood cultures then will d/c patient.

## 2018-10-11 NOTE — ED Notes (Signed)
Lab at bedside

## 2018-10-11 NOTE — Discharge Instructions (Addendum)
Your lab tests today are ok including your urine test, the antibiotic your are taking (keflex) appears to be working for your urinary infection.  Finish the taking this as prescribed.  I advise taking as close to every 6 hours as possible to get the 4 doses in daily.  You do have blood culture lab tests that are currently pending - you will be notified if either of these tests are positive.  As discussed, your CT scan is negative for obvious infection or other source of your symptoms but you do have an enlarged lymph node in your right lower abdomen.  This is large enough that it warrants a biopsy to ensure the lymph node is healthy and not infected or it does not have signs of cancer.  Call your doctor for an office visit within the next week to discuss this and to recheck todays symptoms.

## 2018-10-11 NOTE — ED Provider Notes (Signed)
[facility] EMERGENCY DEPARTMENT Provider Note   CSN: [id number] Arrival date & time: 10/11/18  0900     History   Chief Complaint Chief Complaint  Patient presents with   Chills    HPI [name] is a 73 y.o. male with a history as outlined below, most significant for a fib on Eliquis, IDDM, HTN, COPD and recent uti with continued urinary frequency and urgency in addition to aching low back pain and waking for the past few mornings with chills but no documented fevers.  He is currently on an extended course of keflex, first prescribed here on 9/8, then the course was extended on 9/14, in addition had a IM injection of rocephin.  He denies hematuria, also denies n/v/d, cough, sob, cp, no headache, rash, neck pain or stiffness or other sx suggesting infection. Denies covid exposures, stating "I'm a loner".  He has had no antipyretics today, reports taking his keflex and gabapentin only.  Unsure of cbg status.     The history is provided by the patient.    Past Medical History:  Diagnosis Date   Arthritis    Atrial fibrillation (HCC)    BPH (benign prostatic hypertrophy) 04/15/2010   Cataract    Colon polyps    DEPRESSION 01/22/2009   Heart valve replaced    HYPERCHOLESTEROLEMIA 01/22/2009   HYPERTENSION 01/22/2009   Dr. 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/22/2009   IDDM 01/22/2009   Morbid obesity ([place])    Tremors of nervous system    ?ptsd   Ulcer     Patient Active Problem List   Diagnosis Date Noted   COPD (chronic obstructive pulmonary disease) ([place]) 06/25/2018   Thrombocytopenia ([place]) 05/09/2018   Diabetic neuropathy ([place]) 01/30/2018   S/P AVR (aortic valve replacement) 11/04/2016   Atrial fibrillation ([place]) 09/27/2016   Symptomatic bradycardia 06/12/2016   Urgency incontinence 11/10/2015   Erectile dysfunction 11/10/2015   Groin pain    Hypothyroidism 01/15/2015   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   Phimosis 01/15/2015    CHF (congestive heart failure) ([place]) 01/01/2015   B12 deficiency 04/08/2014   Lateral meniscal tear    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   Type 2 diabetes mellitus with diabetic neuropathy, unspecified ([place]) 03/12/2014   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   GERD (gastroesophageal reflux disease) 11/02/2012   Anemia 04/02/2012   Obesity 08/04/2010   Benign prostatic hyperplasia 04/15/2010   CAROTID OCCLUSIVE DISEASE 01/26/2010   MURMUR 05/27/2009   Aortic valve disease 05/27/2009   Hyperlipidemia with target LDL less than 100 01/22/2009   Depression 01/22/2009   Essential hypertension 01/22/2009    Past Surgical History:  Procedure Laterality Date   AORTIC VALVE REPLACEMENT  July 2006   #20 stentless [place] porcine valve   APPENDECTOMY     bilateral cataract surg     COLONOSCOPY  04/24/2007   [name]: normal   COLONOSCOPY WITH ESOPHAGOGASTRODUODENOSCOPY (EGD) N/A 04/05/2012   Procedure: COLONOSCOPY WITH ESOPHAGOGASTRODUODENOSCOPY (EGD);  Surgeon: Daneil Dolin, MD;  Location: [facility];  Service: Endoscopy;  Laterality: N/A;  10;15   DENTAL SURGERY  05/2004   Dental extractions   EYE SURGERY Bilateral    cataract   HEEL SPUR SURGERY Bilateral    resection of heel spur   KNEE ARTHROSCOPY WITH LATERAL MENISECTOMY Right 04/04/2014   Procedure: KNEE ARTHROSCOPY WITH LATERAL MENISECTOMY;  Surgeon: Carole Civil, MD;  Location: [facility] ORS;  Service: Orthopedics;  Laterality: Right;   PACEMAKER IMPLANT N/A 06/13/2016   Procedure: Pacemaker  Implant- Dual Chamber;  Surgeon: Evans Lance, MD;  Location: [place] [facility];  Service: Cardiovascular;  Laterality: N/A;   PACEMAKER INSERTION  2018   THYROIDECTOMY  03/21/2013   DR [name]   THYROIDECTOMY N/A 03/21/2013   Procedure: THYROIDECTOMY;  Surgeon: [name], MD;  Location: [place];  Service: General;  Laterality: N/A;   TONSILLECTOMY          Home Medications    Prior to Admission  medications   Medication Sig Start Date End Date Taking? Authorizing Provider  acetaminophen (TYLENOL) 500 MG tablet Take 1,000 mg by mouth every 6 (six) hours as needed (pain).    [provider]  cephALEXin (KEFLEX) 500 MG capsule Take 1 capsule (500 mg total) by mouth 4 (four) times daily. 10/02/18   Milton Ferguson, MD  cholecalciferol (VITAMIN D) 1000 units tablet Take 1,000 Units by mouth daily.     [provider]  ciprofloxacin (CIPRO) 500 MG tablet Take 1 tablet (500 mg total) by mouth 2 (two) times daily. Patient not taking: Reported on 10/02/2018 09/10/18   Dettinger, Fransisca Kaufmann, MD  ELIQUIS 5 MG TABS tablet TAKE ONE TABLET BY MOUTH TWICE DAILY 11/28/17   Dettinger, Fransisca Kaufmann, MD  fluticasone (FLONASE) 50 MCG/ACT nasal spray Place 2 sprays into both nostrils daily. 11/14/17   Sharion Balloon, FNP  furosemide (LASIX) 20 MG tablet Take 1 tablet (20 mg total) by mouth daily. 04/17/17   Timmothy Euler, MD  gabapentin (NEURONTIN) 300 MG capsule Take 1 capsule (300 mg total) by mouth 2 (two) times daily. Patient taking differently: Take 300 mg by mouth daily.  01/30/18   Dettinger, Fransisca Kaufmann, MD  glucose blood test strip 1 strip by Does not apply route 3 (three) times daily. Uses true metrix strips (diabetic club) 05/12/16   [provider]  insulin detemir (LEVEMIR) 100 UNIT/ML injection Inject 0.47-0.48 mLs (47-48 Units total) into the skin 2 (two) times daily. Patient taking differently: Inject 42 Units into the skin 2 (two) times daily.  07/04/16   Cherre Robins, PharmD  Insulin Pen Needle (B-D ULTRAFINE III SHORT PEN) 31G X 8 MM MISC Use to inject Victoza qd 11/20/15   Timmothy Euler, MD  levothyroxine (SYNTHROID, LEVOTHROID) 200 MCG tablet Take 1 tablet (200 mcg total) by mouth daily before breakfast. 04/17/17   Timmothy Euler, MD  metFORMIN (GLUCOPHAGE) 1000 MG tablet TAKE 1 TABLET BY MOUTH TWICE DAILY WITH A MEAL. 07/24/15   Timmothy Euler, MD  Multiple  Vitamins-Minerals (MENS MULTI VITAMIN & MINERAL PO) Take 1 tablet by mouth daily.      [provider]  Omega-3 Fatty Acids (FISH OIL) 1200 MG CAPS Take 1,200-2,400 mg by mouth 2 (two) times daily. Take 1 capsule (1200 mg) by mouth daily at noon and 2 capsules (2400 mg) daily at bedtime    [provider]  Semaglutide (OZEMPIC) 0.25 or 0.5 MG/DOSE SOPN Inject into the skin.    [provider]  simvastatin (ZOCOR) 80 MG tablet Take 0.5 tablets (40 mg total) by mouth at bedtime. 04/04/17   Timmothy Euler, MD  tamsulosin (FLOMAX) 0.4 MG CAPS capsule TAKE TWO CAPSULES BY MOUTH AT BEDTIME 09/12/18   Dettinger, Fransisca Kaufmann, MD    Family History Family History  Problem Relation Age of Onset   Heart disease Father    Congestive Heart Failure Father    Arthritis Father    Diabetes Other    Benign  prostatic hyperplasia Brother     Social History Social History   Tobacco Use   Smoking status: Former Smoker    Packs/day: 0.00    Years: 12.00    Pack years: 0.00    Types: Cigarettes    Quit date: 09/20/1961    Years since quitting: 57.0   Smokeless tobacco: Never Used  Substance Use Topics   Alcohol use: No   Drug use: No     Allergies   Phenothiazines and Actos [pioglitazone]   Review of Systems Review of Systems  Constitutional: Positive for chills. Negative for fever.  HENT: Negative for congestion and sore throat.   Eyes: Negative.   Respiratory: Negative for cough, chest tightness and shortness of breath.   Cardiovascular: Negative for chest pain.  Gastrointestinal: Negative for abdominal pain, diarrhea, nausea and vomiting.  Genitourinary: Positive for frequency and urgency.  Musculoskeletal: Positive for back pain. Negative for arthralgias, joint swelling and neck pain.  Skin: Negative.  Negative for rash and wound.  Neurological: Negative for dizziness, weakness, light-headedness and headaches.  Psychiatric/Behavioral: Negative.       Physical Exam Updated Vital Signs BP (!) 153/84    Pulse 73    Temp 98.5 F (36.9 C) (Oral)    Resp 18    Ht 6\' 1"  (1.854 m)    Wt 118.8 kg    SpO2 99%    BMI 34.57 kg/m   Physical Exam Vitals signs and nursing note reviewed.  Constitutional:      Appearance: He is well-developed.  HENT:     Head: Normocephalic and atraumatic.     Mouth/Throat:     Mouth: Mucous membranes are moist.     Pharynx: Oropharynx is clear. No oropharyngeal exudate or posterior oropharyngeal erythema.  Eyes:     Conjunctiva/sclera: Conjunctivae normal.  Neck:     Musculoskeletal: Normal range of motion.  Cardiovascular:     Rate and Rhythm: Normal rate and regular rhythm.     Heart sounds: Normal heart sounds.  Pulmonary:     Effort: Pulmonary effort is normal.     Breath sounds: Normal breath sounds. No wheezing.  Abdominal:     General: Abdomen is protuberant. Bowel sounds are normal.     Palpations: Abdomen is soft.     Tenderness: There is abdominal tenderness in the right lower quadrant. There is no guarding or rebound.     Comments: There is ttp RLQ, soft, no guarding.   Musculoskeletal: Normal range of motion.  Skin:    General: Skin is warm and dry.  Neurological:     Mental Status: He is alert.      ED Treatments / Results  Labs (all labs ordered are listed, but only abnormal results are displayed) Labs Reviewed  URINALYSIS, ROUTINE W REFLEX MICROSCOPIC - Abnormal; Notable for the following components:      Result Value   Ketones, ur 5 (*)    All other components within normal limits  CBC WITH DIFFERENTIAL/PLATELET - Abnormal; Notable for the following components:   RBC 4.04 (*)    Platelets 90 (*)    All other components within normal limits  COMPREHENSIVE METABOLIC PANEL - Abnormal; Notable for the following components:   Sodium 130 (*)    Chloride 95 (*)    Glucose, Bld 184 (*)    Calcium 8.6 (*)    GFR calc non Af Amer 58 (*)    All other components within normal  limits  CBG MONITORING, ED -  Abnormal; Notable for the following components:   Glucose-Capillary 179 (*)    All other components within normal limits  URINE CULTURE  CULTURE, BLOOD (ROUTINE X 2)  CULTURE, BLOOD (ROUTINE X 2)    EKG EKG Interpretation  Date/Time:  Thursday October 11 2018 09:05:37 EDT Ventricular Rate:  80 PR Interval:    QRS Duration: 138 QT Interval:  412 QTC Calculation: 476 R Axis:   51 Text Interpretation:  Accelerated junctional rhythm Vent pre-excit'n(WPW), right access'y pathway Baseline wander in lead(s) V5 Confirmed by Elnora Morrison [phone]) on 10/11/2018 10:59:55 AM   Radiology Ct Renal Stone Study  Result Date: 10/11/2018 CLINICAL DATA:  Right flank pain, chills, UTI EXAM: CT ABDOMEN AND PELVIS WITHOUT CONTRAST TECHNIQUE: Multidetector CT imaging of the abdomen and pelvis was performed following the standard protocol without IV contrast. COMPARISON:  08/30/2017 FINDINGS: Lower chest: No acute abnormality. Hepatobiliary: No solid liver abnormality is seen. No gallstones, gallbladder wall thickening, or biliary dilatation. Pancreas: Unremarkable. No pancreatic ductal dilatation or surrounding inflammatory changes. Spleen: Normal in size without significant abnormality. Adrenals/Urinary Tract: Adrenal glands are unremarkable. Kidneys are normal, without renal calculi, solid lesion, or hydronephrosis. Bladder is unremarkable. Stomach/Bowel: Stomach is within normal limits. Appendix is not clearly visualized and may be surgically absent. No evidence of bowel wall thickening, distention, or inflammatory changes. Vascular/Lymphatic: Aortic atherosclerosis. There is a newly enlarged right inguinal lymph node measuring 3.0 x 1.2 cm, previously 2.0 x 0.6 cm when measured similarly (series 2, image 101). Reproductive: No mass or other significant abnormality. Other: No abdominal wall hernia or abnormality. No abdominopelvic ascites. Musculoskeletal: No acute or significant  osseous findings. IMPRESSION: 1. No noncontrast CT findings of the abdomen or pelvis to explain right flank pain. No evidence of urinary tract calculus or hydronephrosis. 2. There is a newly enlarged right inguinal lymph node measuring 3.0 x 1.2 cm, previously 2.0 x 0.6 cm when measured similarly (series 2, image 101), nonspecific and possibly reactive. 3.  Aortic Atherosclerosis (ICD10-I70.0). Electronically Signed   By: Eddie Candle M.D.   On: 10/11/2018 11:13    Procedures Procedures (including critical care time)  Medications Ordered in ED Medications - No data to display   Initial Impression / Assessment and Plan / ED Course  I have reviewed the triage vital signs and the nursing notes.  Pertinent labs & imaging results that were available during my care of the patient were reviewed by me and considered in my medical decision making (see chart for details).        Pt with recent uti, currently still on keflex with chills, no documented fevers and none here.  Also no other sx suggesting infection, no cough, n/v, low right abd pain with ct imaging showing an enlarged abd lymph node.  No skin infections, right leg/foot without injury or abscess. Plan f/u with pcp within 1 week, finish abx. Blood cx pending.  Right inguinal lymphadenopathy - discussed with pt and advised f/u with pcp within the week, will need biopsy of this node.    Final Clinical Impressions(s) / ED Diagnoses   Final diagnoses:  Chills (without fever)  Inguinal lymphadenopathy    ED Discharge Orders    None       Landis Martins 10/11/18 1258    Elnora Morrison, MD 10/13/18 [phone]

## 2018-10-12 NOTE — Progress Notes (Signed)
Remote pacemaker transmission.   

## 2018-10-13 LAB — URINE CULTURE: Culture: 10000 — AB

## 2018-10-15 ENCOUNTER — Encounter: Payer: Self-pay | Admitting: Family Medicine

## 2018-10-15 ENCOUNTER — Ambulatory Visit (INDEPENDENT_AMBULATORY_CARE_PROVIDER_SITE_OTHER): Payer: Medicare Other | Admitting: Family Medicine

## 2018-10-15 VITALS — BP 120/72 | HR 79 | Temp 98.9°F | Resp 18 | Ht 73.0 in | Wt 260.0 lb

## 2018-10-15 DIAGNOSIS — R590 Localized enlarged lymph nodes: Secondary | ICD-10-CM | POA: Diagnosis not present

## 2018-10-15 DIAGNOSIS — S91214A Laceration without foreign body of right lesser toe(s) with damage to nail, initial encounter: Secondary | ICD-10-CM

## 2018-10-15 NOTE — Progress Notes (Signed)
BP 120/72   Pulse 79   Temp 98.9 F (37.2 C) (Temporal)   Resp 18   Ht 6\' 1"  (1.854 m)   Wt 260 lb (117.9 kg)   SpO2 97%   BMI 34.30 kg/m    Subjective:   Patient ID: [name], male    DOB: Nov 19, 1945, 73 y.o.   MRN: [id number]  HPI: [name] is a 73 y.o. male presenting on 10/15/2018 for cut on right foot (Patient states that he was trimming his toe nails and cut his skin) and Hospitalization Follow-up (9/17 [facility])   HPI Right foot bleeding after cut toenail Patient comes in today with a cut toenail that he did just this morning.  He was cutting his toenails about 20 minutes ago and he cut a little too close and cut some of the tissue with it and has led to bleeding on that toenail.  He has wrapped it up but it still has continued to bleed and he cannot get it to stop bleeding.  Patient was in the hospital for chills, likely related to urinary tract infection but also was found to have inguinal lymphadenopathy that they were concerned about and wanted him to have a referral to surgery for evaluation, likely reactive but will have him follow-up with them like they had asked him and he insists on it and will monitor from there.  Patient has been diagnosed with right inguinal lymphadenopathy.  Relevant past medical, surgical, family and social history reviewed and updated as indicated. Interim medical history since our last visit reviewed. Allergies and medications reviewed and updated.  Review of Systems  Constitutional: Negative for chills and fever.  Respiratory: Negative for shortness of breath and wheezing.   Cardiovascular: Negative for chest pain and leg swelling.  Gastrointestinal: Negative for abdominal pain.  Musculoskeletal: Negative for back pain and gait problem.  Skin: Positive for wound. Negative for rash.  Neurological: Negative for dizziness.  All other systems reviewed and are negative.   Per HPI unless specifically indicated above       Objective:   BP 120/72   Pulse 79   Temp 98.9 F (37.2 C) (Temporal)   Resp 18   Ht 6\' 1"  (1.854 m)   Wt 260 lb (117.9 kg)   SpO2 97%   BMI 34.30 kg/m   Wt Readings from Last 3 Encounters:  10/15/18 260 lb (117.9 kg)  10/11/18 262 lb (118.8 kg)  10/02/18 266 lb (120.7 kg)    Physical Exam Vitals signs and nursing note reviewed.  Constitutional:      General: He is not in acute distress.    Appearance: He is well-developed. He is not diaphoretic.  Eyes:     General: No scleral icterus.    Conjunctiva/sclera: Conjunctivae normal.  Neck:     Musculoskeletal: Neck supple.     Thyroid: No thyromegaly.  Cardiovascular:     Rate and Rhythm: Normal rate and regular rhythm.     Heart sounds: Normal heart sounds. No murmur.  Pulmonary:     Effort: Pulmonary effort is normal. No respiratory distress.     Breath sounds: Normal breath sounds. No wheezing.  Lymphadenopathy:     Cervical: No cervical adenopathy.  Skin:    General: Skin is warm and dry.     Findings: Wound (Wound on his right middle toe just under the toenail where he is clipped it to close and is bleeding, compression did not slow down) present. No rash.  Neurological:     Mental Status: He is alert and oriented to person, place, and time.     Coordination: Coordination normal.  Psychiatric:        Behavior: Behavior normal.       Assessment & Plan:   Problem List Items Addressed This Visit    None    Visit Diagnoses    Inguinal lymphadenopathy    -  Primary   Relevant Orders   Ambulatory referral to General Surgery   Laceration of lesser toe of right foot without foreign body with damage to nail, initial encounter          Right middle toe, used electrocautery to obtain hemostasis, applied antibiotic ointment and compression wrap and instructed the patient to change daily. Follow up plan: Return in about 1 week (around 10/22/2018), or if symptoms worsen or fail to improve, for Recheck toe, watching  for any signs of infection.  Counseling provided for all of the vaccine components No orders of the defined types were placed in this encounter.   Caryl Pina, MD [facility] 10/15/2018, 12:02 PM

## 2018-10-15 NOTE — Telephone Encounter (Signed)
Patient was seen by a provider on 10/15/2018.

## 2018-10-16 LAB — CULTURE, BLOOD (ROUTINE X 2): Culture: NO GROWTH

## 2018-10-18 ENCOUNTER — Other Ambulatory Visit: Payer: Self-pay

## 2018-10-19 ENCOUNTER — Ambulatory Visit (INDEPENDENT_AMBULATORY_CARE_PROVIDER_SITE_OTHER): Payer: Medicare Other | Admitting: Family Medicine

## 2018-10-19 ENCOUNTER — Encounter: Payer: Self-pay | Admitting: Family Medicine

## 2018-10-19 VITALS — BP 133/66 | HR 77 | Temp 97.3°F | Ht 73.0 in | Wt 257.0 lb

## 2018-10-19 DIAGNOSIS — S91114D Laceration without foreign body of right lesser toe(s) without damage to nail, subsequent encounter: Secondary | ICD-10-CM

## 2018-10-19 NOTE — Patient Instructions (Signed)
Wound Care, Adult Taking care of your wound properly can help to prevent pain, infection, and scarring. It can also help your wound to heal more quickly. How to care for your wound Wound care      Follow instructions from your health care provider about how to take care of your wound. Make sure you: ? Wash your hands with soap and water before you change the bandage (dressing). If soap and water are not available, use hand sanitizer. ? Change your dressing as told by your health care provider. ? Leave stitches (sutures), skin glue, or adhesive strips in place. These skin closures may need to stay in place for 2 weeks or longer. If adhesive strip edges start to loosen and curl up, you may trim the loose edges. Do not remove adhesive strips completely unless your health care provider tells you to do that.  Check your wound area every day for signs of infection. Check for: ? Redness, swelling, or pain. ? Fluid or blood. ? Warmth. ? Pus or a bad smell.  Ask your health care provider if you should clean the wound with mild soap and water. Doing this may include: ? Using a clean towel to pat the wound dry after cleaning it. Do not rub or scrub the wound. ? Applying a cream or ointment. Do this only as told by your health care provider. ? Covering the incision with a clean dressing.  Ask your health care provider when you can leave the wound uncovered.  Keep the dressing dry until your health care provider says it can be removed. Do not take baths, swim, use a hot tub, or do anything that would put the wound underwater until your health care provider approves. Ask your health care provider if you can take showers. You may only be allowed to take sponge baths. Medicines   If you were prescribed an antibiotic medicine, cream, or ointment, take or use the antibiotic as told by your health care provider. Do not stop taking or using the antibiotic even if your condition improves.  Take  over-the-counter and prescription medicines only as told by your health care provider. If you were prescribed pain medicine, take it 30 or more minutes before you do any wound care or as told by your health care provider. General instructions  Return to your normal activities as told by your health care provider. Ask your health care provider what activities are safe.  Do not scratch or pick at the wound.  Do not use any products that contain nicotine or tobacco, such as cigarettes and e-cigarettes. These may delay wound healing. If you need help quitting, ask your health care provider.  Keep all follow-up visits as told by your health care provider. This is important.  Eat a diet that includes protein, vitamin A, vitamin C, and other nutrient-rich foods to help the wound heal. ? Foods rich in protein include meat, dairy, beans, nuts, and other sources. ? Foods rich in vitamin A include carrots and dark green, leafy vegetables. ? Foods rich in vitamin C include citrus, tomatoes, and other fruits and vegetables. ? Nutrient-rich foods have protein, carbohydrates, fat, vitamins, or minerals. Eat a variety of healthy foods including vegetables, fruits, and whole grains. Contact a health care provider if:  You received a tetanus shot and you have swelling, severe pain, redness, or bleeding at the injection site.  Your pain is not controlled with medicine.  You have redness, swelling, or pain around the wound.    You have fluid or blood coming from the wound.  Your wound feels warm to the touch.  You have pus or a bad smell coming from the wound.  You have a fever or chills.  You are nauseous or you vomit.  You are dizzy. Get help right away if:  You have a red streak going away from your wound.  The edges of the wound open up and separate.  Your wound is bleeding, and the bleeding does not stop with gentle pressure.  You have a rash.  You faint.  You have trouble breathing.  Summary  Always wash your hands with soap and water before changing your bandage (dressing).  To help with healing, eat foods that are rich in protein, vitamin A, vitamin C, and other nutrients.  Check your wound every day for signs of infection. Contact your health care provider if you suspect that your wound is infected. This information is not intended to replace advice given to you by your health care provider. Make sure you discuss any questions you have with your health care provider. Document Released: 10/20/2007 Document Revised: 04/30/2018 Document Reviewed: 07/28/2015 Elsevier Patient Education  2020 [employer]  

## 2018-10-19 NOTE — Progress Notes (Signed)
Subjective: CC: Wound follow-up PCP: Dettinger, Fransisca Kaufmann, MD [id number] [name] is a 73 y.o. male presenting to clinic today for:  1.  Wound follow-up Patient was seen on 10/15/2018 for a laceration of his toe.  He is here for interval follow-up status post cautery.  Denies any significant swelling, redness or increased warmth.  He does note slight discomfort if he hits it against things.  He has had no recurrent bleeds.  Certainly no purulence.   ROS: Per HPI  Allergies  Allergen Reactions  . Phenothiazines Anaphylaxis  . Actos [Pioglitazone] Swelling   Past Medical History:  Diagnosis Date  . Arthritis   . Atrial fibrillation ([place])   . BPH (benign prostatic hypertrophy) 04/15/2010  . Cataract   . Colon polyps   . DEPRESSION 01/22/2009  . Heart valve replaced   . HYPERCHOLESTEROLEMIA 01/22/2009  . HYPERTENSION 01/22/2009   Dr. [name]  . 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 Morbid obesity ([place])   . Tremors of nervous system    ?ptsd  . Ulcer     Current Outpatient Medications:  .  acetaminophen (TYLENOL) 500 MG tablet, Take 1,000 mg by mouth every 6 (six) hours as needed (pain)., Disp: , Rfl:  .  cephALEXin (KEFLEX) 500 MG capsule, Take 1 capsule (500 mg total) by mouth 4 (four) times daily., Disp: 28 capsule, Rfl: 0 .  cholecalciferol (VITAMIN D) 1000 units tablet, Take 1,000 Units by mouth daily. , Disp: , Rfl:  .  ELIQUIS 5 MG TABS tablet, TAKE ONE TABLET BY MOUTH TWICE DAILY, Disp: 60 tablet, Rfl: 5 .  fluticasone (FLONASE) 50 MCG/ACT nasal spray, Place 2 sprays into both nostrils daily., Disp: 16 g, Rfl: 6 .  furosemide (LASIX) 20 MG tablet, Take 1 tablet (20 mg total) by mouth daily., Disp: 30 tablet, Rfl: 1 .  gabapentin (NEURONTIN) 300 MG capsule, Take 1 capsule (300 mg total) by mouth 2 (two) times daily. (Patient taking differently: Take 300 mg by mouth daily. ), Disp: 180 capsule, Rfl: 3 .  glucose blood test strip, 1 strip by  Does not apply route 3 (three) times daily. Uses true metrix strips (diabetic club), Disp: , Rfl: 12 .  insulin detemir (LEVEMIR) 100 UNIT/ML injection, Inject 0.47-0.48 mLs (47-48 Units total) into the skin 2 (two) times daily. (Patient taking differently: Inject 42 Units into the skin 2 (two) times daily. ), Disp: 10 mL, Rfl:  .  Insulin Pen Needle (B-D ULTRAFINE III SHORT PEN) 31G X 8 MM MISC, Use to inject Victoza qd, Disp: 100 each, Rfl: 5 .  levothyroxine (SYNTHROID, LEVOTHROID) 200 MCG tablet, Take 1 tablet (200 mcg total) by mouth daily before breakfast., Disp: 30 tablet, Rfl: 1 .  metFORMIN (GLUCOPHAGE) 1000 MG tablet, TAKE 1 TABLET BY MOUTH TWICE DAILY WITH A MEAL., Disp: 180 tablet, Rfl: 0 .  Multiple Vitamins-Minerals (MENS MULTI VITAMIN & MINERAL PO), Take 1 tablet by mouth daily.  , Disp: , Rfl:  .  Omega-3 Fatty Acids (FISH OIL) 1200 MG CAPS, Take 1,200-2,400 mg by mouth 2 (two) times daily. Take 1 capsule (1200 mg) by mouth daily at noon and 2 capsules (2400 mg) daily at bedtime, Disp: , Rfl:  .  Semaglutide (OZEMPIC) 0.25 or 0.5 MG/DOSE SOPN, Inject into the skin., Disp: , Rfl:  .  simvastatin (ZOCOR) 80 MG tablet, Take 0.5 tablets (40 mg total) by mouth at bedtime., Disp: 15 tablet, Rfl: 0 .  tamsulosin (FLOMAX) 0.4 MG CAPS  capsule, TAKE TWO CAPSULES BY MOUTH AT BEDTIME, Disp: 180 capsule, Rfl: 0 Social History   Socioeconomic History  . Marital status: Single    Spouse name: Not on file  . Number of children: 1  . Years of education: HS  . Highest education level: Not on file  Occupational History  . Occupation: Administrator, retired    Fish farm manager: RETIRED  Social Needs  . Financial resource strain: Not on file  . Food insecurity    Worry: Not on file    Inability: Not on file  . Transportation needs    Medical: Not on file    Non-medical: Not on file  Tobacco Use  . Smoking status: Former Smoker    Packs/day: 0.00    Years: 12.00    Pack years: 0.00    Types:  Cigarettes    Quit date: 09/20/1961    Years since quitting: 57.1  . Smokeless tobacco: Never Used  Substance and Sexual Activity  . Alcohol use: No  . Drug use: No  . Sexual activity: Never    Comment: Has not smoked in 20 years  Lifestyle  . Physical activity    Days per week: Not on file    Minutes per session: Not on file  . Stress: Not on file  Relationships  . Social Herbalist on phone: Not on file    Gets together: Not on file    Attends religious service: Not on file    Active member of club or organization: Not on file    Attends meetings of clubs or organizations: Not on file    Relationship status: Not on file  . Intimate partner violence    Fear of current or ex partner: Not on file    Emotionally abused: Not on file    Physically abused: Not on file    Forced sexual activity: Not on file  Other Topics Concern  . Not on file  Social History Narrative   Lives alone.   Quit smoking approximately 28 years ago.   Long haul truck driver, lives in [place].   Family History  Problem Relation Age of Onset  . Heart disease Father   . Congestive Heart Failure Father   . Arthritis Father   . Diabetes Other   . Benign prostatic hyperplasia Brother     Objective: Office vital signs reviewed. BP 133/66   Pulse 77   Temp (!) 97.3 F (36.3 C) (Temporal)   Ht 6\' 1"  (1.854 m)   Wt 257 lb (116.6 kg)   BMI 33.91 kg/m   Physical Examination:  Foot: Right third digit with hemostatic laceration.  No evidence of secondary infection including increased warmth, soft tissue swelling, redness or purulence.  Assessment/ Plan: 73 y.o. male   1. Laceration of lesser toe of right foot without foreign body present or damage to nail, subsequent encounter No evidence of secondary bacterial infection.  Appears to be uncomplicated healing.  Encouraged to keep area clean with gentle soap and water, keep protected with ambulation.  Follow-up if any signs or symptoms of  infection develop.   No orders of the defined types were placed in this encounter.  No orders of the defined types were placed in this encounter.    Janora Norlander, DO [place] [phone]

## 2018-10-30 ENCOUNTER — Ambulatory Visit (INDEPENDENT_AMBULATORY_CARE_PROVIDER_SITE_OTHER): Payer: Medicare Other | Admitting: General Surgery

## 2018-10-30 ENCOUNTER — Encounter: Payer: Self-pay | Admitting: General Surgery

## 2018-10-30 ENCOUNTER — Other Ambulatory Visit: Payer: Self-pay

## 2018-10-30 VITALS — BP 114/58 | HR 75 | Temp 97.8°F | Resp 16 | Ht 73.0 in | Wt 259.0 lb

## 2018-10-30 DIAGNOSIS — R590 Localized enlarged lymph nodes: Secondary | ICD-10-CM

## 2018-10-30 NOTE — Patient Instructions (Signed)

## 2018-10-31 NOTE — Progress Notes (Signed)
[name]; [id number]; 1945/02/07   HPI Patient is a 73 year old white male who was referred to my care by Dr. Warrick Parisian for evaluation treatment of inguinal lymphadenopathy.  Patient was trimming his toenails and cut some of the tissue on the toenails in the right foot.  Was also recently admitted to the hospital for urinary tract infection and was found to have inguinal lymphadenopathy.  It was felt to be reactive at that time.  The patient requested referral to my care for further evaluation.  Patient is on an antibiotic.  Patient denies any fever or chills.  He currently has no pain.  Patient denies any weight loss or generalized malaise. Past Medical History:  Diagnosis Date  . Arthritis   . Atrial fibrillation ([name])   . BPH (benign prostatic hypertrophy) 04/15/2010  . Cataract   . Colon polyps   . DEPRESSION 01/22/2009  . Heart valve replaced   . HYPERCHOLESTEROLEMIA 01/22/2009  . HYPERTENSION 01/22/2009   Dr. [name]  . 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 Morbid obesity ([place])   . Tremors of nervous system    ?ptsd  . Ulcer     Past Surgical History:  Procedure Laterality Date  . 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 APPENDECTOMY    . bilateral cataract surg    . COLONOSCOPY  04/24/2007   [name]: normal  . COLONOSCOPY WITH ESOPHAGOGASTRODUODENOSCOPY (EGD) N/A 04/05/2012   Procedure: COLONOSCOPY WITH ESOPHAGOGASTRODUODENOSCOPY (EGD);  Surgeon: Daneil Dolin, MD;  Location: [facility];  Service: Endoscopy;  Laterality: N/A;  10;15  . DENTAL SURGERY  05/2004   Dental extractions  . EYE SURGERY Bilateral    cataract  . HEEL SPUR SURGERY Bilateral    resection of heel spur  . KNEE ARTHROSCOPY WITH LATERAL MENISECTOMY Right 04/04/2014   Procedure: KNEE ARTHROSCOPY WITH LATERAL MENISECTOMY;  Surgeon: Carole Civil, MD;  Location: [facility];  Service: Orthopedics;  Laterality: Right;  . PACEMAKER  IMPLANT N/A 06/13/2016   Procedure: Pacemaker Implant- Dual Chamber;  Surgeon: Evans Lance, MD;  Location: [facility];  Service: Cardiovascular;  Laterality: N/A;  . PACEMAKER INSERTION  2018  . THYROIDECTOMY  03/21/2013   DR [name]  . THYROIDECTOMY N/A 03/21/2013   Procedure: THYROIDECTOMY;  Surgeon: [name], MD;  Location: [facility];  Service: General;  Laterality: N/A;  . TONSILLECTOMY      Family History  Problem Relation Age of Onset  . Heart disease Father   . Congestive Heart Failure Father   . Arthritis Father   . Diabetes Other   . Benign prostatic hyperplasia Brother     Current Outpatient Medications on File Prior to Visit  Medication Sig Dispense Refill  . acetaminophen (TYLENOL) 500 MG tablet Take 1,000 mg by mouth every 6 (six) hours as needed (pain).    . cephALEXin (KEFLEX) 500 MG capsule Take 1 capsule (500 mg total) by mouth 4 (four) times daily. 28 capsule 0  . cholecalciferol (VITAMIN D) 1000 units tablet Take 1,000 Units by mouth daily.     Marland Kitchen ELIQUIS 5 MG TABS tablet TAKE ONE TABLET BY MOUTH TWICE DAILY 60 tablet 5  . fluticasone (FLONASE) 50 MCG/ACT nasal spray Place 2 sprays into both nostrils daily. 16 g 6  . furosemide (LASIX) 20 MG tablet Take 1 tablet (20 mg total) by mouth daily. 30 tablet 1  . gabapentin (NEURONTIN) 300 MG capsule Take  1 capsule (300 mg total) by mouth 2 (two) times daily. (Patient taking differently: Take 300 mg by mouth daily. ) 180 capsule 3  . glucose blood test strip 1 strip by Does not apply route 3 (three) times daily. Uses true metrix strips (diabetic club)  12  . insulin detemir (LEVEMIR) 100 UNIT/ML injection Inject 0.47-0.48 mLs (47-48 Units total) into the skin 2 (two) times daily. (Patient taking differently: Inject 42 Units into the skin 2 (two) times daily. ) 10 mL   . Insulin Pen Needle (B-D ULTRAFINE III SHORT PEN) 31G X 8 MM MISC Use to inject Victoza qd 100 each 5  . levothyroxine (SYNTHROID, LEVOTHROID) 200  MCG tablet Take 1 tablet (200 mcg total) by mouth daily before breakfast. 30 tablet 1  . metFORMIN (GLUCOPHAGE) 1000 MG tablet TAKE 1 TABLET BY MOUTH TWICE DAILY WITH A MEAL. 180 tablet 0  . Multiple Vitamins-Minerals (MENS MULTI VITAMIN & MINERAL PO) Take 1 tablet by mouth daily.      . Omega-3 Fatty Acids (FISH OIL) 1200 MG CAPS Take 1,200-2,400 mg by mouth 2 (two) times daily. Take 1 capsule (1200 mg) by mouth daily at noon and 2 capsules (2400 mg) daily at bedtime    . Semaglutide (OZEMPIC) 0.25 or 0.5 MG/DOSE SOPN Inject into the skin.    Marland Kitchen simvastatin (ZOCOR) 80 MG tablet Take 0.5 tablets (40 mg total) by mouth at bedtime. 15 tablet 0  . tamsulosin (FLOMAX) 0.4 MG CAPS capsule TAKE TWO CAPSULES BY MOUTH AT BEDTIME 180 capsule 0   No current facility-administered medications on file prior to visit.     Allergies  Allergen Reactions  . Phenothiazines Anaphylaxis  . Actos [Pioglitazone] Swelling    Social History   Substance and Sexual Activity  Alcohol Use No    Social History   Tobacco Use  Smoking Status Former Smoker  . Packs/day: 0.00  . Years: 12.00  . Pack years: 0.00  . Types: Cigarettes  . Quit date: 09/20/1961  . Years since quitting: 80.1  Smokeless Tobacco Never Used    Review of Systems  Constitutional: Negative.   HENT: Negative.   Eyes: Negative.   Respiratory: Negative.   Cardiovascular: Negative.   Gastrointestinal: Negative.   Genitourinary: Negative.   Musculoskeletal: Positive for back pain.  Skin: Negative.   Neurological: Negative.   Endo/Heme/Allergies: Negative.   Psychiatric/Behavioral: Negative.     Objective   Vitals:   10/30/18 1050  BP: (!) 114/58  Pulse: 75  Resp: 16  Temp: 97.8 F (36.6 C)  SpO2: 97%    Physical Exam Vitals signs reviewed.  Constitutional:      Appearance: Normal appearance. He is obese. He is not ill-appearing.  HENT:     Head: Normocephalic and atraumatic.  Cardiovascular:     Rate and Rhythm:  Normal rate and regular rhythm.     Heart sounds: Normal heart sounds. No murmur. No friction rub. No gallop.   Pulmonary:     Effort: Pulmonary effort is normal. No respiratory distress.     Breath sounds: Normal breath sounds. No stridor. No wheezing, rhonchi or rales.  Abdominal:     General: Bowel sounds are normal. There is no distension.     Palpations: Abdomen is soft. There is no mass.     Tenderness: There is no abdominal tenderness. There is no guarding or rebound.  Skin:    General: Skin is warm and dry.  Neurological:     Mental Status:  He is alert and oriented to person, place, and time.   Shotty lymphadenopathy in the right inguinal region.  There is a prominent single lymph node.  No significant left inguinal lymphadenopathy.  CT scan images personally reviewed.  Enlarged lymph node in the right inguinal region.  Assessment  Right inguinal lymph node enlargement, probably reactive due to recent toenail injury.  UTI may also have had an effect. Plan   I told the patient that there was no need for acute surgical intervention at this time.  This lymph node does appear to be reactive, though I would like to see him back in 6 weeks to reassess.  Should it still be significantly enlarged, fine-needle aspiration may be indicated.  He understands this and agrees.

## 2018-11-01 DIAGNOSIS — N471 Phimosis: Secondary | ICD-10-CM | POA: Diagnosis not present

## 2018-11-01 DIAGNOSIS — N3000 Acute cystitis without hematuria: Secondary | ICD-10-CM | POA: Diagnosis not present

## 2018-11-01 DIAGNOSIS — N3020 Other chronic cystitis without hematuria: Secondary | ICD-10-CM | POA: Diagnosis not present

## 2018-11-16 DIAGNOSIS — R300 Dysuria: Secondary | ICD-10-CM | POA: Diagnosis not present

## 2018-11-16 DIAGNOSIS — N3001 Acute cystitis with hematuria: Secondary | ICD-10-CM | POA: Diagnosis not present

## 2018-11-19 ENCOUNTER — Telehealth: Payer: Self-pay | Admitting: Internal Medicine

## 2018-11-19 ENCOUNTER — Ambulatory Visit: Payer: Medicare Other | Admitting: Family

## 2018-11-19 NOTE — Telephone Encounter (Signed)
I called patient.States he has a UTI. This morning around 11 am he felt a brief episode of chest pain under his pacer sight in his left upper chest.States "it came and it went". Denies SOB,N/V, diaphoresis or radiation.I offered him an apt with the PA tomorrow and he states he has an apt with Dr.Jenkins and his pcp televisit for chest congestion. Patient then stated he will call Dr.Hochrein and make an apt for a later date and hung up call

## 2018-11-19 NOTE — Telephone Encounter (Signed)
New Message    Patient states he has chest discomfort and feels gassy.  He states he has no issues with pain and when he belches he feels a little better.  Please call patient back to discuss.

## 2018-11-19 NOTE — Telephone Encounter (Signed)
Will route to triage as pt is c/o chest pain.

## 2018-11-20 ENCOUNTER — Ambulatory Visit (INDEPENDENT_AMBULATORY_CARE_PROVIDER_SITE_OTHER): Payer: Medicare Other | Admitting: Family Medicine

## 2018-11-20 ENCOUNTER — Ambulatory Visit: Payer: Medicare Other | Admitting: General Surgery

## 2018-11-20 NOTE — Progress Notes (Signed)
Called.  Symptoms of totally resolved.  I will and arrive him so he is not charged for this appointment.

## 2018-11-27 ENCOUNTER — Ambulatory Visit: Payer: Medicare Other | Admitting: General Surgery

## 2018-11-30 ENCOUNTER — Other Ambulatory Visit: Payer: Self-pay

## 2018-11-30 ENCOUNTER — Ambulatory Visit (INDEPENDENT_AMBULATORY_CARE_PROVIDER_SITE_OTHER): Payer: Medicare Other | Admitting: Family Medicine

## 2018-11-30 ENCOUNTER — Encounter: Payer: Self-pay | Admitting: Family Medicine

## 2018-11-30 VITALS — BP 132/70 | HR 78 | Temp 97.7°F | Ht 73.0 in | Wt 259.2 lb

## 2018-11-30 DIAGNOSIS — Z794 Long term (current) use of insulin: Secondary | ICD-10-CM

## 2018-11-30 DIAGNOSIS — E1140 Type 2 diabetes mellitus with diabetic neuropathy, unspecified: Secondary | ICD-10-CM | POA: Diagnosis not present

## 2018-11-30 DIAGNOSIS — E785 Hyperlipidemia, unspecified: Secondary | ICD-10-CM

## 2018-11-30 DIAGNOSIS — E890 Postprocedural hypothyroidism: Secondary | ICD-10-CM | POA: Diagnosis not present

## 2018-11-30 DIAGNOSIS — E1142 Type 2 diabetes mellitus with diabetic polyneuropathy: Secondary | ICD-10-CM

## 2018-11-30 DIAGNOSIS — I509 Heart failure, unspecified: Secondary | ICD-10-CM

## 2018-11-30 DIAGNOSIS — I10 Essential (primary) hypertension: Secondary | ICD-10-CM

## 2018-11-30 DIAGNOSIS — Z23 Encounter for immunization: Secondary | ICD-10-CM | POA: Diagnosis not present

## 2018-11-30 DIAGNOSIS — K219 Gastro-esophageal reflux disease without esophagitis: Secondary | ICD-10-CM

## 2018-11-30 DIAGNOSIS — S91114D Laceration without foreign body of right lesser toe(s) without damage to nail, subsequent encounter: Secondary | ICD-10-CM

## 2018-11-30 DIAGNOSIS — I4891 Unspecified atrial fibrillation: Secondary | ICD-10-CM

## 2018-11-30 LAB — BAYER DCA HB A1C WAIVED: HB A1C (BAYER DCA - WAIVED): 8 % — ABNORMAL HIGH (ref <7.0)

## 2018-11-30 NOTE — Progress Notes (Signed)
BP 132/70   Pulse 78   Temp 97.7 F (36.5 C) (Temporal)   Ht _0  (1.854 m)   Wt 259 lb 3.2 oz (117.6 kg)   SpO2 98%   BMI 34.20 kg/m    Subjective:   Patient ID: [name], male    DOB: [birth year], 73 y.o.   MRN: [id number]  HPI: [name] is a 73 y.o. male presenting on 11/30/2018 for Diabetes and Hypertension   HPI Type 2 diabetes mellitus Patient comes in today for recheck of his diabetes. Patient has been currently taking Levemir and gabapentin and Metformin. Patient is not currently on an ACE inhibitor/ARB. Patient has not seen an ophthalmologist this year. Patient denies any issues with their feet.  Patient has neuropathy and CKD  Hypertension and CHF Patient is currently on no medication, and their blood pressure today is 132/70. Patient denies any lightheadedness or dizziness. Patient denies headaches, blurred vision, chest pains, shortness of breath, or weakness. Denies any side effects from medication and is content with current medication.   Hyperlipidemia Patient is coming in for recheck of his hyperlipidemia. The patient is currently taking fish oil and simvastatin. They deny any issues with myalgias or history of liver damage from it. They deny any focal numbness or weakness or chest pain.   GERD Patient is currently on no medication currently and feels like he is doing well denies any symptoms.  She denies any major symptoms or abdominal pain or belching or burping. She denies any blood in her stool or lightheadedness or dizziness.   Right fourth toe infection, is on antibiotic currently and appears to be healing, there is still some discoloration and pink discoloration in that toe but no purulence or induration, no tenderness on palpation.  Continue antibiotic and will see back  Relevant past medical, surgical, family and social history reviewed and updated as indicated. Interim medical history since our last visit reviewed. Allergies and medications  reviewed and updated.  Review of Systems  Constitutional: Negative for chills and fever.  Respiratory: Negative for shortness of breath and wheezing.   Cardiovascular: Negative for chest pain and leg swelling.  Musculoskeletal: Negative for back pain and gait problem.  Skin: Positive for color change and wound. Negative for rash.  All other systems reviewed and are negative.   Per HPI unless specifically indicated above   Allergies as of 11/30/2018      Reactions   Phenothiazines Anaphylaxis   Actos [pioglitazone] Swelling      Medication List       Accurate as of November 30, 2018 11:59 PM. If you have any questions, ask your nurse or doctor.        STOP taking these medications   cephALEXin 500 MG capsule Commonly known as: KEFLEX Stopped by: Fransisca Kaufmann Pernell Lenoir, MD     TAKE these medications   acetaminophen 500 MG tablet Commonly known as: TYLENOL Take 1,000 mg by mouth every 6 (six) hours as needed (pain).   cholecalciferol 1000 units tablet Commonly known as: VITAMIN D Take 1,000 Units by mouth daily.   Eliquis 5 MG Tabs tablet Generic drug: apixaban TAKE ONE TABLET BY MOUTH TWICE DAILY   Fish Oil 1200 MG Caps Take 1,200-2,400 mg by mouth 2 (two) times daily. Take 1 capsule (1200 mg) by mouth daily at noon and 2 capsules (2400 mg) daily at bedtime   fluticasone 50 MCG/ACT nasal spray Commonly known as: FLONASE Place 2 sprays into both nostrils  daily.   furosemide 20 MG tablet Commonly known as: LASIX Take 1 tablet (20 mg total) by mouth daily.   gabapentin 300 MG capsule Commonly known as: NEURONTIN Take 1 capsule (300 mg total) by mouth 2 (two) times daily. What changed: when to take this   glucose blood test strip 1 strip by Does not apply route 3 (three) times daily. Uses true metrix strips (diabetic club)   insulin detemir 100 UNIT/ML injection Commonly known as: LEVEMIR Inject 0.47-0.48 mLs (47-48 Units total) into the skin 2 (two) times  daily. What changed: how much to take   Insulin Pen Needle 31G X 8 MM Misc Commonly known as: B-D ULTRAFINE III SHORT PEN Use to inject Victoza qd   levothyroxine 200 MCG tablet Commonly known as: SYNTHROID Take 1 tablet (200 mcg total) by mouth daily before breakfast.   MENS MULTI VITAMIN & MINERAL PO Take 1 tablet by mouth daily.   metFORMIN 1000 MG tablet Commonly known as: GLUCOPHAGE TAKE 1 TABLET BY MOUTH TWICE DAILY WITH A MEAL.   Ozempic (0.25 or 0.5 MG/DOSE) 2 MG/1.5ML Sopn Generic drug: Semaglutide(0.25 or 0.5MG/DOS) Inject into the skin.   simvastatin 80 MG tablet Commonly known as: ZOCOR Take 0.5 tablets (40 mg total) by mouth at bedtime.   tamsulosin 0.4 MG Caps capsule Commonly known as: FLOMAX TAKE TWO CAPSULES BY MOUTH AT BEDTIME        Objective:   BP 132/70   Pulse 78   Temp 97.7 F (36.5 C) (Temporal)   Ht _0  (1.854 m)   Wt 259 lb 3.2 oz (117.6 kg)   SpO2 98%   BMI 34.20 kg/m   Wt Readings from Last 3 Encounters:  11/30/18 259 lb 3.2 oz (117.6 kg)  10/30/18 259 lb (117.5 kg)  10/19/18 257 lb (116.6 kg)    Physical Exam Vitals signs and nursing note reviewed.  Constitutional:      General: He is not in acute distress.    Appearance: He is well-developed. He is not diaphoretic.  Eyes:     General: No scleral icterus.    Conjunctiva/sclera: Conjunctivae normal.  Neck:     Musculoskeletal: Neck supple.     Thyroid: No thyromegaly.  Cardiovascular:     Rate and Rhythm: Normal rate and regular rhythm.     Heart sounds: Normal heart sounds. No murmur.  Pulmonary:     Effort: Pulmonary effort is normal. No respiratory distress.     Breath sounds: Normal breath sounds. No wheezing.  Lymphadenopathy:     Cervical: No cervical adenopathy.  Skin:    General: Skin is warm and dry.     Findings: No rash.  Neurological:     Mental Status: He is alert and oriented to person, place, and time.     Coordination: Coordination normal.   Psychiatric:        Behavior: Behavior normal.     Diabetic Foot Exam - Simple   Simple Foot Form Visual Inspection No deformities, no ulcerations, no other skin breakdown bilaterally: Yes Sensation Testing Intact to touch and monofilament testing bilaterally: Yes Pulse Check Posterior Tibialis and Dorsalis pulse intact bilaterally: Yes Comments Patient still has a little bit of pink discoloration/erythema in the right toe although it does appear to be healing, he still has a healed scabbed over part on the anterior toe where he is cut his toenails too deep but no erythema or purulence is noted or induration either.     Assessment &  Plan:   Problem List Items Addressed This Visit      Cardiovascular and Mediastinum   Essential hypertension   CHF (congestive heart failure) (HCC)   Atrial fibrillation (HCC)     Digestive   GERD (gastroesophageal reflux disease)     Endocrine   Postsurgical hypothyroidism   Relevant Orders   TSH (Completed)   Type 2 diabetes mellitus with diabetic neuropathy, unspecified ([place]) - Primary   Relevant Orders   hgba1c (Completed)   Microalbumin / creatinine urine ratio   CMP14+EGFR (Completed)   Diabetic neuropathy (HCC)     Other   Hyperlipidemia with target LDL less than 100   Relevant Orders   Lipid panel (Completed)    Other Visit Diagnoses    Laceration of lesser toe of right foot without foreign body present or damage to nail, subsequent encounter       Need for immunization against influenza       Relevant Orders   Flu Vaccine QUAD High Dose(Fluad) (Completed)      A1c is 8.0, he is going to keep a closer eye on his blood sugars and bring me back the numbers, likely due to infection and slightly up, will keep a close eye on this and if it does not come back down then we may need to increase his insulin.  Follow-up in 2 weeks for recheck  Follow up plan: Return in about 2 weeks (around 12/14/2018), or if symptoms worsen or fail  to improve, for Recheck toe infection of right fourth toe.  Counseling provided for all of the vaccine components Orders Placed This Encounter  Procedures  . Flu Vaccine QUAD High Dose(Fluad)  . Pneumococcal polysaccharide vaccine 23-valent greater than or equal to 2yo subcutaneous/IM  . hgba1c  . Microalbumin / creatinine urine ratio  . CMP14+EGFR  . TSH  . Lipid panel    Caryl Pina, MD [facility] 12/06/2018, 9:33 PM

## 2018-12-01 LAB — CMP14+EGFR
ALT: 22 IU/L (ref 0–44)
AST: 21 IU/L (ref 0–40)
Albumin/Globulin Ratio: 1.5 (ref 1.2–2.2)
Albumin: 4.2 g/dL (ref 3.7–4.7)
Alkaline Phosphatase: 66 IU/L (ref 39–117)
BUN/Creatinine Ratio: 16 (ref 10–24)
BUN: 19 mg/dL (ref 8–27)
Bilirubin Total: 0.3 mg/dL (ref 0.0–1.2)
CO2: 23 mmol/L (ref 20–29)
Calcium: 8.8 mg/dL (ref 8.6–10.2)
Chloride: 98 mmol/L (ref 96–106)
Creatinine, Ser: 1.17 mg/dL (ref 0.76–1.27)
GFR calc Af Amer: 70 mL/min/{1.73_m2} (ref >59)
GFR calc non Af Amer: 61 mL/min/{1.73_m2} (ref >59)
Globulin, Total: 2.8 g/dL (ref 1.5–4.5)
Glucose: 193 mg/dL — ABNORMAL HIGH (ref 65–99)
Potassium: 4.9 mmol/L (ref 3.5–5.2)
Sodium: 138 mmol/L (ref 134–144)
Total Protein: 7 g/dL (ref 6.0–8.5)

## 2018-12-01 LAB — LIPID PANEL
Chol/HDL Ratio: 2.6 ratio (ref 0.0–5.0)
Cholesterol, Total: 124 mg/dL (ref 100–199)
HDL: 47 mg/dL (ref >39)
LDL Chol Calc (NIH): 61 mg/dL (ref 0–99)
Triglycerides: 82 mg/dL (ref 0–149)
VLDL Cholesterol Cal: 16 mg/dL (ref 5–40)

## 2018-12-01 LAB — TSH: TSH: 0.434 u[IU]/mL — ABNORMAL LOW (ref 0.450–4.500)

## 2018-12-04 ENCOUNTER — Encounter: Payer: Self-pay | Admitting: Family

## 2018-12-04 ENCOUNTER — Ambulatory Visit (INDEPENDENT_AMBULATORY_CARE_PROVIDER_SITE_OTHER): Payer: Medicare Other | Admitting: Family

## 2018-12-04 ENCOUNTER — Telehealth: Payer: Self-pay | Admitting: Family Medicine

## 2018-12-04 DIAGNOSIS — E1149 Type 2 diabetes mellitus with other diabetic neurological complication: Secondary | ICD-10-CM

## 2018-12-04 DIAGNOSIS — E1142 Type 2 diabetes mellitus with diabetic polyneuropathy: Secondary | ICD-10-CM

## 2018-12-04 MED ORDER — GABAPENTIN 300 MG PO CAPS: 300.0000 mg | ORAL_CAPSULE | Freq: Two times a day (BID) | ORAL | 3 refills | Status: DC

## 2018-12-04 NOTE — Telephone Encounter (Signed)
Patient reports last night he was having chills and today he has started feeling like "pins and needles" sticking him all over his body.  Appointment scheduled for televisit with Evelina Dun at 5:15 pm today.

## 2018-12-04 NOTE — Progress Notes (Signed)
   Virtual Visit via telephone Note Due to COVID-19 pandemic this visit was conducted virtually. This visit type was conducted due to national recommendations for restrictions regarding the COVID-19 Pandemic (e.g. social distancing, sheltering in place) in an effort to limit this patient's exposure and mitigate transmission in our community. All issues noted in this document were discussed and addressed.  A physical exam was not performed with this format.  I connected with [name] on 12/04/18 at 3:13 pm by telephone and verified that I am speaking with the correct person using two identifiers. [name] is currently located at home and no one is currently with him  during visit. The provider, Evelina Dun, FNP is located in their office at time of visit.  I discussed the limitations, risks, security and privacy concerns of performing an evaluation and management service by telephone and the availability of in person appointments. I also discussed with the patient that there may be a patient responsible charge related to this service. The patient expressed understanding and agreed to proceed.   History and Present Illness:  HPI  Pt calls the office today for a feeling of "pens and needles" all over. He states this comes and goes and occurs at random spots on his body. He just completed cipro yesterday for a UTI.   He reports his UTI has improved and denies any urinary frequency,  dysuria, or fever.   He reports his BS this AM was 153. Denies any vision or  numbness in feet.  His last A1C on 11/30/18 was 8.0.   He is taking gabapentin 300 mg at bedtime.   Review of Systems  All other systems reviewed and are negative.    Observations/Objective: No SOB or distress noted   Assessment and Plan: 1. Other diabetic neurological complication associated with type 2 diabetes mellitus (HCC) - gabapentin (NEURONTIN) 300 MG capsule; Take 1 capsule (300 mg total) by mouth 2 (two)  times daily.  Dispense: 180 capsule; Refill: 3  2. Diabetic polyneuropathy associated with type 2 diabetes mellitus (HCC) - gabapentin (NEURONTIN) 300 MG capsule; Take 1 capsule (300 mg total) by mouth 2 (two) times daily.  Dispense: 180 capsule; Refill: 3   We will increase gabapentin to 300 mg BID from daily Low carb diet diet and continue all medications Keep follow up with PCP    I discussed the assessment and treatment plan with the patient. The patient was provided an opportunity to ask questions and all were answered. The patient agreed with the plan and demonstrated an understanding of the instructions.   The patient was advised to call back or seek an in-person evaluation if the symptoms worsen or if the condition fails to improve as anticipated.  The above assessment and management plan was discussed with the patient. The patient verbalized understanding of and has agreed to the management plan. Patient is aware to call the clinic if symptoms persist or worsen. Patient is aware when to return to the clinic for a follow-up visit. Patient educated on when it is appropriate to go to the emergency department.   Time call ended:  3:26 pm   I provided 13 minutes of non-face-to-face time during this encounter.    Evelina Dun, FNP '

## 2018-12-08 DIAGNOSIS — S61412A Laceration without foreign body of left hand, initial encounter: Secondary | ICD-10-CM | POA: Diagnosis not present

## 2018-12-10 ENCOUNTER — Other Ambulatory Visit: Payer: Self-pay

## 2018-12-10 ENCOUNTER — Telehealth: Payer: Self-pay | Admitting: Family Medicine

## 2018-12-10 ENCOUNTER — Emergency Department (HOSPITAL_COMMUNITY)
Admission: EM | Admit: 2018-12-10 | Discharge: 2018-12-10 | Disposition: A | Discharge status: Home / Self Care | Payer: Medicare Other | Attending: Emergency Medicine | Admitting: Emergency Medicine

## 2018-12-10 ENCOUNTER — Emergency Department (HOSPITAL_COMMUNITY): Payer: Medicare Other

## 2018-12-10 ENCOUNTER — Encounter (HOSPITAL_COMMUNITY): Payer: Self-pay

## 2018-12-10 DIAGNOSIS — N390 Urinary tract infection, site not specified: Secondary | ICD-10-CM

## 2018-12-10 DIAGNOSIS — I4891 Unspecified atrial fibrillation: Secondary | ICD-10-CM | POA: Diagnosis not present

## 2018-12-10 DIAGNOSIS — I509 Heart failure, unspecified: Secondary | ICD-10-CM | POA: Insufficient documentation

## 2018-12-10 DIAGNOSIS — Z87891 Personal history of nicotine dependence: Secondary | ICD-10-CM | POA: Insufficient documentation

## 2018-12-10 DIAGNOSIS — Z954 Presence of other heart-valve replacement: Secondary | ICD-10-CM | POA: Insufficient documentation

## 2018-12-10 DIAGNOSIS — E782 Mixed hyperlipidemia: Secondary | ICD-10-CM | POA: Diagnosis not present

## 2018-12-10 DIAGNOSIS — Z794 Long term (current) use of insulin: Secondary | ICD-10-CM | POA: Insufficient documentation

## 2018-12-10 DIAGNOSIS — Z888 Allergy status to other drugs, medicaments and biological substances status: Secondary | ICD-10-CM | POA: Diagnosis not present

## 2018-12-10 DIAGNOSIS — E119 Type 2 diabetes mellitus without complications: Secondary | ICD-10-CM | POA: Insufficient documentation

## 2018-12-10 DIAGNOSIS — Z79899 Other long term (current) drug therapy: Secondary | ICD-10-CM | POA: Insufficient documentation

## 2018-12-10 DIAGNOSIS — Z950 Presence of cardiac pacemaker: Secondary | ICD-10-CM | POA: Diagnosis not present

## 2018-12-10 DIAGNOSIS — M25551 Pain in right hip: Secondary | ICD-10-CM

## 2018-12-10 DIAGNOSIS — S79911A Unspecified injury of right hip, initial encounter: Secondary | ICD-10-CM | POA: Diagnosis not present

## 2018-12-10 DIAGNOSIS — I110 Hypertensive heart disease with heart failure: Secondary | ICD-10-CM | POA: Diagnosis not present

## 2018-12-10 DIAGNOSIS — N3000 Acute cystitis without hematuria: Secondary | ICD-10-CM | POA: Diagnosis not present

## 2018-12-10 LAB — URINALYSIS, ROUTINE W REFLEX MICROSCOPIC
Bilirubin Urine: NEGATIVE
Glucose, UA: 50 mg/dL — AB
Ketones, ur: NEGATIVE mg/dL
Nitrite: NEGATIVE
Protein, ur: NEGATIVE mg/dL
Specific Gravity, Urine: 1.009 (ref 1.005–1.030)
WBC, UA: >50 WBC/hpf — ABNORMAL HIGH (ref 0–5)
pH: 6 (ref 5.0–8.0)

## 2018-12-10 MED ORDER — NAPROXEN 250 MG PO TABS
500.0000 mg | ORAL_TABLET | Freq: Once | ORAL | Status: AC
  Administered 2018-12-10: 500 mg via ORAL
  Filled 2018-12-10: qty 2

## 2018-12-10 MED ORDER — NAPROXEN 500 MG PO TABS: 500.0000 mg | ORAL_TABLET | Freq: Two times a day (BID) | ORAL | 0 refills | Status: DC

## 2018-12-10 MED ORDER — CEPHALEXIN 500 MG PO CAPS: 500.0000 mg | ORAL_CAPSULE | Freq: Two times a day (BID) | ORAL | 0 refills | Status: AC

## 2018-12-10 MED ORDER — CEPHALEXIN 500 MG PO CAPS
500.0000 mg | ORAL_CAPSULE | Freq: Once | ORAL | Status: AC
  Administered 2018-12-10: 11:00:00 500 mg via ORAL
  Filled 2018-12-10: qty 1

## 2018-12-10 NOTE — ED Provider Notes (Signed)
[facility] EMERGENCY DEPARTMENT Provider Note   CSN: [id number] Arrival date & time: 12/10/18  [id number]     History   Chief Complaint Chief Complaint  Patient presents with   Hip Pain    HPI [name] is a 73 y.o. male.      Hip Pain This is a new problem. The current episode started 6 to 12 hours ago. The problem occurs constantly. The problem has not changed since onset.Pertinent negatives include no chest pain, no abdominal pain, no headaches and no shortness of breath. The symptoms are aggravated by standing and walking. The symptoms are relieved by lying down (Pain-free when resting and laying down). He has tried nothing for the symptoms.    This patient is a pleasant 73 year old male, history of atrial fibrillation, history of recurrent urinary tract infections, history of appendicitis status post removal and a history of aortic valve replacement.  He is a diabetic, he takes Eliquis.  The patient presents today with a complaint of right groin pain.  He states that this started at around midnight, he does report having a fall yesterday where he fell to the ground.  He was able to walk though and was not having significant tenderness until last night.  Since that time he has had significant difficulty ambulating because of the pain in that right groin and hip area.  He denies that is up in the abdomen.  There is no chest pain coughing or shortness of breath, no fevers or chills, no nausea vomiting or diarrhea, he does endorse having mild dysuria but no urinary frequency or hematuria.  He has had frequent urinary tract infections in the past.  Past Medical History:  Diagnosis Date   Arthritis    Atrial fibrillation (HCC)    BPH (benign prostatic hypertrophy) 04/15/2010   Cataract    Colon polyps    DEPRESSION 01/22/2009   Heart valve replaced    HYPERCHOLESTEROLEMIA 01/22/2009   HYPERTENSION 01/22/2009   Dr. [name]   HYPOTHYROIDISM, POST-RADIATION  01/22/2009   IDDM 01/22/2009   Morbid obesity ([place])    Tremors of nervous system    ?ptsd   Ulcer     Patient Active Problem List   Diagnosis Date Noted   COPD (chronic obstructive pulmonary disease) ([place]) 06/25/2018   Thrombocytopenia ([place]) 05/09/2018   Diabetic neuropathy ([place]) 01/30/2018   S/P AVR (aortic valve replacement) 11/04/2016   Atrial fibrillation ([place]) 09/27/2016   Symptomatic bradycardia 06/12/2016   Urgency incontinence 11/10/2015   Erectile dysfunction 11/10/2015   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   Phimosis 01/15/2015   CHF (congestive heart failure) ([place]) 01/01/2015   B12 deficiency 04/08/2014   Lateral meniscal tear    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   Type 2 diabetes mellitus with diabetic neuropathy, unspecified ([place]) 03/12/2014   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   GERD (gastroesophageal reflux disease) 11/02/2012   Obesity 08/04/2010   Benign prostatic hyperplasia 04/15/2010   CAROTID OCCLUSIVE DISEASE 01/26/2010   MURMUR 05/27/2009   Aortic valve disease 05/27/2009   Hyperlipidemia with target LDL less than 100 01/22/2009   Depression 01/22/2009   Essential hypertension 01/22/2009    Past Surgical History:  Procedure Laterality Date   AORTIC VALVE REPLACEMENT  July 2006   #20 stentless [place] porcine valve   APPENDECTOMY     bilateral cataract surg     COLONOSCOPY  04/24/2007   [name]: normal   COLONOSCOPY WITH ESOPHAGOGASTRODUODENOSCOPY (EGD) N/A 04/05/2012   Procedure: COLONOSCOPY WITH ESOPHAGOGASTRODUODENOSCOPY (EGD);  Surgeon: Herbie Baltimore  Hilton Cork, MD;  Location: [facility];  Service: Endoscopy;  Laterality: N/A;  10;15   DENTAL SURGERY  05/2004   Dental extractions   EYE SURGERY Bilateral    cataract   HEEL SPUR SURGERY Bilateral    resection of heel spur   KNEE ARTHROSCOPY WITH LATERAL MENISECTOMY Right 04/04/2014   Procedure: KNEE ARTHROSCOPY WITH LATERAL MENISECTOMY;  Surgeon: Carole Civil,  MD;  Location: [facility] ORS;  Service: Orthopedics;  Laterality: Right;   PACEMAKER IMPLANT N/A 06/13/2016   Procedure: Pacemaker Implant- Dual Chamber;  Surgeon: Evans Lance, MD;  Location: [place] CV LAB;  Service: Cardiovascular;  Laterality: N/A;   PACEMAKER INSERTION  2018   THYROIDECTOMY  03/21/2013   DR [name]   THYROIDECTOMY N/A 03/21/2013   Procedure: THYROIDECTOMY;  Surgeon: [name], MD;  Location: [place];  Service: General;  Laterality: N/A;   TONSILLECTOMY          Home Medications    Prior to Admission medications   Medication Sig Start Date End Date Taking? Authorizing Provider  acetaminophen (TYLENOL) 500 MG tablet Take 1,000 mg by mouth every 6 (six) hours as needed (pain).    [provider]  cephALEXin (KEFLEX) 500 MG capsule Take 1 capsule (500 mg total) by mouth 2 (two) times daily for 7 days. 12/10/18 12/17/18  Noemi Chapel, MD  cholecalciferol (VITAMIN D) 1000 units tablet Take 1,000 Units by mouth daily.     [provider]  ELIQUIS 5 MG TABS tablet TAKE ONE TABLET BY MOUTH TWICE DAILY 11/28/17   Dettinger, Fransisca Kaufmann, MD  fluticasone (FLONASE) 50 MCG/ACT nasal spray Place 2 sprays into both nostrils daily. 11/14/17   Sharion Balloon, FNP  furosemide (LASIX) 20 MG tablet Take 1 tablet (20 mg total) by mouth daily. 04/17/17   Timmothy Euler, MD  gabapentin (NEURONTIN) 300 MG capsule Take 1 capsule (300 mg total) by mouth 2 (two) times daily. 12/04/18   Evelina Dun A, FNP  glucose blood test strip 1 strip by Does not apply route 3 (three) times daily. Uses true metrix strips (diabetic club) 05/12/16   [provider]  insulin detemir (LEVEMIR) 100 UNIT/ML injection Inject 0.47-0.48 mLs (47-48 Units total) into the skin 2 (two) times daily. Patient taking differently: Inject 42 Units into the skin 2 (two) times daily.  07/04/16   Cherre Robins, PharmD  Insulin Pen Needle (B-D ULTRAFINE III SHORT PEN) 31G X 8 MM MISC Use to inject  Victoza qd 11/20/15   Timmothy Euler, MD  levothyroxine (SYNTHROID, LEVOTHROID) 200 MCG tablet Take 1 tablet (200 mcg total) by mouth daily before breakfast. 04/17/17   Timmothy Euler, MD  metFORMIN (GLUCOPHAGE) 1000 MG tablet TAKE 1 TABLET BY MOUTH TWICE DAILY WITH A MEAL. 07/24/15   Timmothy Euler, MD  Multiple Vitamins-Minerals (MENS MULTI VITAMIN & MINERAL PO) Take 1 tablet by mouth daily.      [provider]  naproxen (NAPROSYN) 500 MG tablet Take 1 tablet (500 mg total) by mouth 2 (two) times daily with a meal. 12/10/18   Noemi Chapel, MD  Omega-3 Fatty Acids (FISH OIL) 1200 MG CAPS Take 1,200-2,400 mg by mouth 2 (two) times daily. Take 1 capsule (1200 mg) by mouth daily at noon and 2 capsules (2400 mg) daily at bedtime    [provider]  Semaglutide (OZEMPIC) 0.25 or 0.5 MG/DOSE SOPN Inject into the skin.    [provider]  simvastatin (ZOCOR)  80 MG tablet Take 0.5 tablets (40 mg total) by mouth at bedtime. 04/04/17   Timmothy Euler, MD  tamsulosin (FLOMAX) 0.4 MG CAPS capsule TAKE TWO CAPSULES BY MOUTH AT BEDTIME 09/12/18   Dettinger, Fransisca Kaufmann, MD    Family History Family History  Problem Relation Age of Onset   Heart disease Father    Congestive Heart Failure Father    Arthritis Father    Diabetes Other    Benign prostatic hyperplasia Brother     Social History Social History   Tobacco Use   Smoking status: Former Smoker    Packs/day: 0.00    Years: 12.00    Pack years: 0.00    Types: Cigarettes    Quit date: 09/20/1961    Years since quitting: 57.2   Smokeless tobacco: Never Used  Substance Use Topics   Alcohol use: Not Currently   Drug use: No     Allergies   Phenothiazines and Actos [pioglitazone]   Review of Systems Review of Systems  Respiratory: Negative for shortness of breath.   Cardiovascular: Negative for chest pain.  Gastrointestinal: Negative for abdominal pain.  Neurological: Negative for  headaches.  All other systems reviewed and are negative.    Physical Exam Updated Vital Signs BP 138/68    Pulse 76    Temp 98 F (36.7 C) (Oral)    Resp 18    Wt 113.4 kg    SpO2 98%    BMI 32.98 kg/m   Physical Exam Vitals signs and nursing note reviewed.  Constitutional:      General: He is not in acute distress.    Appearance: He is well-developed.  HENT:     Head: Normocephalic and atraumatic.     Mouth/Throat:     Pharynx: No oropharyngeal exudate.  Eyes:     General: No scleral icterus.       Right eye: No discharge.        Left eye: No discharge.     Conjunctiva/sclera: Conjunctivae normal.     Pupils: Pupils are equal, round, and reactive to light.  Neck:     Musculoskeletal: Normal range of motion and neck supple.     Thyroid: No thyromegaly.     Vascular: No JVD.  Cardiovascular:     Rate and Rhythm: Normal rate and regular rhythm.     Heart sounds: Murmur present. No friction rub. No gallop.   Pulmonary:     Effort: Pulmonary effort is normal. No respiratory distress.     Breath sounds: Normal breath sounds. No wheezing or rales.  Abdominal:     General: Bowel sounds are normal. There is no distension.     Palpations: Abdomen is soft. There is no mass.     Tenderness: There is abdominal tenderness ( Minimal tenderness to the right lower quadrant, no right upper quadrant tenderness and no left-sided tenderness).  Musculoskeletal: Normal range of motion.        General: No tenderness.     Comments: There is mild tenderness in the right inguinal area, there is no obvious hernia, the patient is able to fully range of motion his right hip and knee with minimal pain, when he tries to flex against resistance at the hip on the right he has reproducible pain, there is mild reproducible pain with palpation in that inguinal region, no lateral hip tenderness.  No pain with range of motion of the hip passively  Lymphadenopathy:     Cervical: No cervical  adenopathy.   Skin:    General: Skin is warm and dry.     Findings: No erythema or rash.  Neurological:     Mental Status: He is alert.     Coordination: Coordination normal.     Comments: The patient is able to fully lift both legs off the bed with normal strength, normal sensation, strength, coordination, speech and facial symmetry  Psychiatric:        Behavior: Behavior normal.      ED Treatments / Results  Labs (all labs ordered are listed, but only abnormal results are displayed) Labs Reviewed  URINALYSIS, ROUTINE W REFLEX MICROSCOPIC - Abnormal; Notable for the following components:      Result Value   APPearance TURBID (*)    Glucose, UA 50 (*)    Hgb urine dipstick SMALL (*)    Leukocytes,Ua LARGE (*)    WBC, UA >50 (*)    Bacteria, UA MANY (*)    Non Squamous Epithelial 0-5 (*)    All other components within normal limits  URINE CULTURE    EKG None  Radiology Dg Hip Unilat W Or Wo Pelvis 2-3 Views Right  Result Date: 12/10/2018 CLINICAL DATA:  Fall, hip pain EXAM: DG HIP (WITH OR WITHOUT PELVIS) 2-3V RIGHT COMPARISON:  None. FINDINGS: Hips are located. No evidence of pelvic fracture or sacral fracture. Dedicated view of the RIGHT hip demonstrates no femoral neck fracture. There is bilateral narrowing of the hip joint superiorly. There is sclerosis of the acetabular roofs. IMPRESSION: 1. No acute findings of the pelvis or RIGHT hip. 2. Osteoarthritis of the hips. Electronically Signed   By: Suzy Bouchard M.D.   On: 12/10/2018 10:11    Procedures Procedures (including critical care time)  Medications Ordered in ED Medications  cephALEXin (KEFLEX) capsule 500 mg (has no administration in time range)  naproxen (NAPROSYN) tablet 500 mg (has no administration in time range)     Initial Impression / Assessment and Plan / ED Course  I have reviewed the triage vital signs and the nursing notes.  Pertinent labs & imaging results that were available during my care of the  patient were reviewed by me and considered in my medical decision making (see chart for details).  Clinical Course as of Dec 09 1020  Mon Dec 10, 2018  1019 I have personally looked at the x-rays of the pelvis and the right hip, there is no signs of fractures or dislocations, there is some osteoarthritis however.The patient does have a urinalysis showing more than 50 white blood cells and many bacteria consistent with urinary tract infection.  It is nitrite negative, culture will be sent, the patient will be started on antibiotics.  He is not tachycardic nor is he febrile, stable for discharge   [BM]    Clinical Course User Index [BM] Noemi Chapel, MD       The patient did have a fall yesterday, would consider that there may be a hip injury secondary to that however he is ambulatory in the room, he is able to move around on the bed, he is able to perform all of the functions that I asked with mild reproducible tenderness in the right groin.  This could be related to a strain of muscles, could be a pelvic fracture or hip injury, less likely to be abdominal, possible urinary given his history.  The patient is otherwise stable, unremarkable, nontachycardic and normotensive.  He is agreeable to an x-ray and a urinalysis.  Final Clinical Impressions(s) / ED Diagnoses   Final diagnoses:  Urinary tract infection without hematuria, site unspecified  Right hip pain    ED Discharge Orders         Ordered    cephALEXin (KEFLEX) 500 MG capsule  2 times daily     12/10/18 1021    naproxen (NAPROSYN) 500 MG tablet  2 times daily with meals     12/10/18 1021           Noemi Chapel, MD 12/10/18 1022

## 2018-12-10 NOTE — ED Triage Notes (Signed)
Pt c/o r hip pain that started at midnight last night.  Denies injury.

## 2018-12-10 NOTE — Discharge Instructions (Signed)
Your x-ray is normal Your urinalysis shows that you have another infection Please take Keflex twice daily for 7 days  Seek medical exam for increasing pain vomiting or fever.

## 2018-12-10 NOTE — ED Notes (Signed)
Patient greeted at this time and this RN explained role. Urine specimen collected at this time and sent to lab. Patient assisted with urinal at bedside due to reported fall. Patient changed into gown and exam completed. Will continue to monitor.

## 2018-12-10 NOTE — Telephone Encounter (Signed)
Pt should not need to come and leave another urine = [place] has a urine culture pending currently.  He should await these results.  NA/ BUSY 11/16-jhb

## 2018-12-11 ENCOUNTER — Other Ambulatory Visit: Payer: Medicare Other

## 2018-12-11 DIAGNOSIS — Z794 Long term (current) use of insulin: Secondary | ICD-10-CM | POA: Diagnosis not present

## 2018-12-11 DIAGNOSIS — E1140 Type 2 diabetes mellitus with diabetic neuropathy, unspecified: Secondary | ICD-10-CM

## 2018-12-12 ENCOUNTER — Other Ambulatory Visit: Payer: Self-pay | Admitting: Family Medicine

## 2018-12-12 LAB — MICROALBUMIN / CREATININE URINE RATIO
Creatinine, Urine: 74.1 mg/dL
Microalb/Creat Ratio: 52 mg/g creat — ABNORMAL HIGH (ref 0–29)
Microalbumin, Urine: 38.4 ug/mL

## 2018-12-12 LAB — URINE CULTURE: Culture: >=100000 — AB

## 2018-12-13 ENCOUNTER — Telehealth: Payer: Self-pay | Admitting: Family Medicine

## 2018-12-13 ENCOUNTER — Other Ambulatory Visit: Payer: Self-pay | Admitting: Family

## 2018-12-13 ENCOUNTER — Telehealth: Payer: Self-pay

## 2018-12-13 ENCOUNTER — Ambulatory Visit: Payer: Medicare Other | Admitting: General Surgery

## 2018-12-13 DIAGNOSIS — J069 Acute upper respiratory infection, unspecified: Secondary | ICD-10-CM

## 2018-12-13 NOTE — Progress Notes (Addendum)
ED Antimicrobial Stewardship Positive Culture Follow Up   [name] is an 73 y.o. male who presented to [facility] on 12/10/2018 with a chief complaint of  Chief Complaint  Patient presents with  . Hip Pain    Recent Results (from the past 720 hour(s))  Urine culture     Status: Abnormal   Collection Time: 12/10/18  9:27 AM   Specimen: Urine, Clean Catch  Result Value Ref Range Status   Specimen Description   Final    URINE, CLEAN CATCH Performed at [facility], [address], [place], [place] [address]    Special Requests   Final    NONE Performed at [facility], [address], [place], [place] [address]    Culture (A)  Final    >=100,000 COLONIES/mL ENTEROBACTER CLOACAE Confirmed Extended Spectrum Beta-Lactamase Producer (ESBL).  In bloodstream infections from ESBL organisms, carbapenems are preferred over piperacillin/tazobactam. They are shown to have a lower risk of mortality.    Report Status 12/12/2018 FINAL  Final   Organism ID, Bacteria ENTEROBACTER CLOACAE (A)  Final      Susceptibility   Enterobacter cloacae - MIC*    CEFAZOLIN >=64 RESISTANT Resistant     CEFTRIAXONE >=64 RESISTANT Resistant     CIPROFLOXACIN >=4 RESISTANT Resistant     GENTAMICIN <=1 SENSITIVE Sensitive     IMIPENEM <=0.25 SENSITIVE Sensitive     NITROFURANTOIN >=512 RESISTANT Resistant     TRIMETH/SULFA <=20 SENSITIVE Sensitive     PIP/TAZO >=128 RESISTANT Resistant     * >=100,000 COLONIES/mL ENTEROBACTER CLOACAE    [x]  Treated with cephalexin, organism resistant to prescribed antimicrobial []  Patient discharged originally without antimicrobial agent and treatment is now indicated  New antibiotic prescription: Bactrim DS 1 tablet by mouth twice daily for 7 days  ED Provider: Martin[place] Robinson, PA-C   Ladoris Gene, PharmD Candidate 12/13/2018, 9:59 AM

## 2018-12-13 NOTE — Telephone Encounter (Signed)
If his blood sugar is not getting any lower than 110 than that is actually good, he may be feeling low but he will get used to that, if he runs below 75 then let me know, continue for now.  If at any point he is feeling jittery or shaky then check it so we know if it is running low.

## 2018-12-13 NOTE — Telephone Encounter (Signed)
Patient aware and verbalizes understanding. 

## 2018-12-13 NOTE — Telephone Encounter (Signed)
Post ED Visit - Positive Culture Follow-up: Unsuccessful Patient Follow-up  Culture assessed and recommendations reviewed by:  []  Elenor Quinones, Pharm.D. [x]  Heide Guile, Pharm.D., BCPS AQ-ID []  Parks Neptune, Pharm.D., BCPS []  Alycia Rossetti, Pharm.D., BCPS []  [place], Pharm.D., BCPS, AAHIVP []  Legrand Como, Pharm.D., BCPS, AAHIVP []  [place] Balloon, PharmD []  Vincenza Hews, PharmD, BCPS  Positive urine culture  []  Patient discharged without antimicrobial prescription and treatment is now indicated [x]  Organism is resistant to prescribed ED discharge antimicrobial []  Patient with positive blood cultures   Unable to contact patient after 3 attempts, letter will be sent to address on file  Genia Del 12/13/2018, 10:42 AM

## 2018-12-13 NOTE — Telephone Encounter (Deleted)
Post ED Visit - Positive Culture Follow-up: Unsuccessful Patient Follow-up  Culture assessed and recommendations reviewed by:  []  Elenor Quinones, Pharm.D. [x]  Heide Guile, Pharm.D., BCPS AQ-ID []  Parks Neptune, Pharm.D., BCPS []  Alycia Rossetti, Pharm.D., BCPS []  [place], Pharm.D., BCPS, AAHIVP []  Legrand Como, Pharm.D., BCPS, AAHIVP []  [place] Balloon, PharmD []  Vincenza Hews, PharmD, BCPS  Positive urineculture  []  Patient discharged without antimicrobial prescription and treatment is now indicated [x]  Organism is resistant to prescribed ED discharge antimicrobial []  Patient with positive blood cultures   Needs Cephalexin 500 mg BID x 7 days  per Martinique Robinson PA Unable to contact patient after 3 attempts, letter will be sent to address on file  Genia Del 12/13/2018, 10:40 AM

## 2018-12-13 NOTE — Telephone Encounter (Signed)
Patient states that he thinks he is taking too much Levemir- takes 40 units BID.  States that his BS have been running between 110-114.  Thinks he should cut back. Please advise

## 2018-12-17 ENCOUNTER — Encounter: Payer: Self-pay | Admitting: Family Medicine

## 2018-12-17 ENCOUNTER — Other Ambulatory Visit: Payer: Self-pay | Admitting: *Deleted

## 2018-12-17 MED ORDER — LISINOPRIL 5 MG PO TABS: 5.0000 mg | ORAL_TABLET | Freq: Every day | ORAL | 1 refills | Status: DC

## 2018-12-19 ENCOUNTER — Telehealth: Payer: Self-pay | Admitting: Emergency Medicine

## 2018-12-24 ENCOUNTER — Encounter: Payer: Self-pay | Admitting: Family Medicine

## 2018-12-24 ENCOUNTER — Other Ambulatory Visit: Payer: Self-pay

## 2018-12-24 ENCOUNTER — Ambulatory Visit (INDEPENDENT_AMBULATORY_CARE_PROVIDER_SITE_OTHER): Payer: Medicare Other | Admitting: Family Medicine

## 2018-12-24 VITALS — BP 117/71 | HR 78 | Temp 97.1°F | Ht 73.0 in | Wt 262.4 lb

## 2018-12-24 DIAGNOSIS — N3000 Acute cystitis without hematuria: Secondary | ICD-10-CM

## 2018-12-24 DIAGNOSIS — R809 Proteinuria, unspecified: Secondary | ICD-10-CM | POA: Diagnosis not present

## 2018-12-24 NOTE — Progress Notes (Signed)
BP 117/71   Pulse 78   Temp (!) 97.1 F (36.2 C) (Temporal)   Ht 6' 1" (1.854 m)   Wt 262 lb 6.4 oz (119 kg)   SpO2 97%   BMI 34.62 kg/m    Subjective:   Patient ID: [name], male    DOB: [birth year], 73 y.o.   MRN: [id number]  HPI: [name] is a 73 y.o. male presenting on 12/24/2018 for 2 week re check (re check kidney function )   HPI Patient was diagnosed with microalbuminuria is also coming in for ER follow-up for UTI.  He is currently on the Bactrim and feels like he is doing better and has couple days left of the Bactrim.  He was started on lisinopril for microalbuminuria and we will recheck his blood work today and see how that is going for him.  Relevant past medical, surgical, family and social history reviewed and updated as indicated. Interim medical history since our last visit reviewed. Allergies and medications reviewed and updated.  Review of Systems  Constitutional: Negative for chills and fever.  Respiratory: Negative for shortness of breath and wheezing.   Cardiovascular: Negative for chest pain and leg swelling.  Musculoskeletal: Negative for back pain and gait problem.  Skin: Negative for rash.  All other systems reviewed and are negative.   Per HPI unless specifically indicated above   Allergies as of 12/24/2018      Reactions   Phenothiazines Anaphylaxis   Actos [pioglitazone] Swelling      Medication List       Accurate as of December 24, 2018 11:36 AM. If you have any questions, ask your nurse or doctor.        acetaminophen 500 MG tablet Commonly known as: TYLENOL Take 1,000 mg by mouth every 6 (six) hours as needed (pain).   cholecalciferol 1000 units tablet Commonly known as: VITAMIN D Take 1,000 Units by mouth daily.   Eliquis 5 MG Tabs tablet Generic drug: apixaban TAKE ONE TABLET BY MOUTH TWICE DAILY   Fish Oil 1200 MG Caps Take 1,200-2,400 mg by mouth 2 (two) times daily. Take 1 capsule (1200 mg) by mouth  daily at noon and 2 capsules (2400 mg) daily at bedtime   fluticasone 50 MCG/ACT nasal spray Commonly known as: FLONASE INSTILL TWO SPRAYS IN EACH NOSTRIL DAILY   furosemide 20 MG tablet Commonly known as: LASIX Take 1 tablet (20 mg total) by mouth daily.   gabapentin 300 MG capsule Commonly known as: NEURONTIN Take 1 capsule (300 mg total) by mouth 2 (two) times daily.   glucose blood test strip 1 strip by Does not apply route 3 (three) times daily. Uses true metrix strips (diabetic club)   insulin detemir 100 UNIT/ML injection Commonly known as: LEVEMIR Inject 0.47-0.48 mLs (47-48 Units total) into the skin 2 (two) times daily. What changed: how much to take   Insulin Pen Needle 31G X 8 MM Misc Commonly known as: B-D ULTRAFINE III SHORT PEN Use to inject Victoza qd   levothyroxine 200 MCG tablet Commonly known as: SYNTHROID Take 1 tablet (200 mcg total) by mouth daily before breakfast.   lisinopril 5 MG tablet Commonly known as: ZESTRIL Take 1 tablet (5 mg total) by mouth daily.   MENS MULTI VITAMIN & MINERAL PO Take 1 tablet by mouth daily.   metFORMIN 1000 MG tablet Commonly known as: GLUCOPHAGE TAKE 1 TABLET BY MOUTH TWICE DAILY WITH A MEAL.   naproxen 500 MG  tablet Commonly known as: Naprosyn Take 1 tablet (500 mg total) by mouth 2 (two) times daily with a meal.   Ozempic (0.25 or 0.5 MG/DOSE) 2 MG/1.5ML Sopn Generic drug: Semaglutide(0.25 or 0.5MG/DOS) Inject into the skin.   simvastatin 80 MG tablet Commonly known as: ZOCOR Take 0.5 tablets (40 mg total) by mouth at bedtime.   sulfamethoxazole-trimethoprim 800-160 MG tablet Commonly known as: BACTRIM DS Take 1 tablet by mouth 2 (two) times daily.   tamsulosin 0.4 MG Caps capsule Commonly known as: FLOMAX TAKE TWO CAPSULES BY MOUTH AT BEDTIME        Objective:   BP 117/71   Pulse 78   Temp (!) 97.1 F (36.2 C) (Temporal)   Ht 6' 1" (1.854 m)   Wt 262 lb 6.4 oz (119 kg)   SpO2 97%   BMI  34.62 kg/m   Wt Readings from Last 3 Encounters:  12/24/18 262 lb 6.4 oz (119 kg)  12/10/18 250 lb (113.4 kg)  11/30/18 259 lb 3.2 oz (117.6 kg)    Physical Exam Vitals signs and nursing note reviewed.  Constitutional:      General: He is not in acute distress.    Appearance: He is well-developed. He is not diaphoretic.  Eyes:     General: No scleral icterus.    Conjunctiva/sclera: Conjunctivae normal.  Neck:     Thyroid: No thyromegaly.  Skin:    General: Skin is warm and dry.     Findings: No rash.  Neurological:     Mental Status: He is alert and oriented to person, place, and time.     Coordination: Coordination normal.  Psychiatric:        Behavior: Behavior normal.     Results for orders placed or performed in visit on 12/11/18  Microalbumin / creatinine urine ratio  Result Value Ref Range   Creatinine, Urine 74.1 Not Estab. mg/dL   Microalbumin, Urine 38.4 Not Estab. ug/mL   Microalb/Creat Ratio 52 (H) 0 - 29 mg/g creat    Assessment & Plan:   Problem List Items Addressed This Visit      Other   Microalbuminuria   Relevant Orders   BMP8+EGFR    Other Visit Diagnoses    Acute cystitis without hematuria    -  Primary   Relevant Orders   BMP8+EGFR      Patient started lisinopril and his blood pressure appears good, he denies any lightheadedness or dizziness, will recheck kidney function today  , Still on antibiotics and no point in checking urine again today Follow up plan: Return in about 3 months (around 03/24/2019), or if symptoms worsen or fail to improve, for Diabetes recheck.  Counseling provided for all of the vaccine components Orders Placed This Encounter  Procedures  . BMP8+EGFR    Caryl Pina, MD [facility] 12/24/2018, 11:36 AM

## 2018-12-25 LAB — BMP8+EGFR
BUN/Creatinine Ratio: 15 (ref 10–24)
BUN: 28 mg/dL — ABNORMAL HIGH (ref 8–27)
CO2: 21 mmol/L (ref 20–29)
Calcium: 9.1 mg/dL (ref 8.6–10.2)
Chloride: 103 mmol/L (ref 96–106)
Creatinine, Ser: 1.84 mg/dL — ABNORMAL HIGH (ref 0.76–1.27)
GFR calc Af Amer: 41 mL/min/{1.73_m2} — ABNORMAL LOW (ref >59)
GFR calc non Af Amer: 35 mL/min/{1.73_m2} — ABNORMAL LOW (ref >59)
Glucose: 128 mg/dL — ABNORMAL HIGH (ref 65–99)
Potassium: 5.8 mmol/L — ABNORMAL HIGH (ref 3.5–5.2)
Sodium: 137 mmol/L (ref 134–144)

## 2018-12-26 ENCOUNTER — Telehealth: Payer: Self-pay | Admitting: Family Medicine

## 2018-12-26 NOTE — Telephone Encounter (Signed)
Patient aware sof labs and that he needs to come in Friday to get checked

## 2018-12-27 ENCOUNTER — Telehealth: Payer: Self-pay | Admitting: Family Medicine

## 2018-12-27 NOTE — Telephone Encounter (Signed)
Spoke with pt and he says he feels "like I got fluid on me, weighed down, sluggish" pt denies Fever/ChillsCoughShortness of BreathFatigueMuscle or body achesEar PainHeadacheLoss of taste or smellSore throatCongestionRunny NoseNausea or vomitingDiarrhea and says he has to come in for blood work tomorrow morning and wanted to see Dr Building control surveyor. Advised pt he doesn't have any openings and offered pt an appt with MMM this afternoon in afterhours for televisit but pt declined. Advised pt if he develops any of the above symptoms to call us and not to come in the office to have blood drawn in the morning if he has symptoms. Pt voiced understanding.

## 2018-12-28 ENCOUNTER — Telehealth: Payer: Self-pay | Admitting: Family Medicine

## 2018-12-28 ENCOUNTER — Other Ambulatory Visit: Payer: Medicare Other

## 2018-12-28 ENCOUNTER — Other Ambulatory Visit: Payer: Self-pay

## 2018-12-28 DIAGNOSIS — N289 Disorder of kidney and ureter, unspecified: Secondary | ICD-10-CM

## 2018-12-28 DIAGNOSIS — E875 Hyperkalemia: Secondary | ICD-10-CM

## 2018-12-29 LAB — BMP8+EGFR
BUN/Creatinine Ratio: 14 (ref 10–24)
BUN: 21 mg/dL (ref 8–27)
CO2: 20 mmol/L (ref 20–29)
Calcium: 9 mg/dL (ref 8.6–10.2)
Chloride: 102 mmol/L (ref 96–106)
Creatinine, Ser: 1.52 mg/dL — ABNORMAL HIGH (ref 0.76–1.27)
GFR calc Af Amer: 51 mL/min/{1.73_m2} — ABNORMAL LOW (ref >59)
GFR calc non Af Amer: 44 mL/min/{1.73_m2} — ABNORMAL LOW (ref >59)
Glucose: 231 mg/dL — ABNORMAL HIGH (ref 65–99)
Potassium: 5.8 mmol/L — ABNORMAL HIGH (ref 3.5–5.2)
Sodium: 134 mmol/L (ref 134–144)

## 2018-12-31 ENCOUNTER — Telehealth: Payer: Self-pay | Admitting: Family Medicine

## 2018-12-31 NOTE — Telephone Encounter (Signed)
Pt aware of latest labs

## 2018-12-31 NOTE — Telephone Encounter (Signed)
Can you review labs from Friday and advise

## 2018-12-31 NOTE — Telephone Encounter (Signed)
Pt aware as of today, to stop the lisinopril

## 2018-12-31 NOTE — Addendum Note (Signed)
Addended byCarrolyn Leigh on: 12/31/2018 03:52 PM   Modules accepted: Orders

## 2019-01-01 DIAGNOSIS — N3020 Other chronic cystitis without hematuria: Secondary | ICD-10-CM | POA: Diagnosis not present

## 2019-01-02 ENCOUNTER — Ambulatory Visit (INDEPENDENT_AMBULATORY_CARE_PROVIDER_SITE_OTHER): Payer: Medicare Other | Admitting: Family Medicine

## 2019-01-02 ENCOUNTER — Encounter: Payer: Self-pay | Admitting: Family Medicine

## 2019-01-02 ENCOUNTER — Other Ambulatory Visit: Payer: Self-pay

## 2019-01-02 ENCOUNTER — Telehealth: Payer: Self-pay | Admitting: Family Medicine

## 2019-01-02 ENCOUNTER — Inpatient Hospital Stay (HOSPITAL_COMMUNITY): Payer: Non-veteran care | Attending: Hematology

## 2019-01-02 VITALS — BP 135/61 | HR 86 | Temp 96.9°F | Ht 73.0 in | Wt 257.0 lb

## 2019-01-02 DIAGNOSIS — N390 Urinary tract infection, site not specified: Secondary | ICD-10-CM | POA: Diagnosis not present

## 2019-01-02 DIAGNOSIS — R300 Dysuria: Secondary | ICD-10-CM | POA: Diagnosis not present

## 2019-01-02 LAB — MICROSCOPIC EXAMINATION
Epithelial Cells (non renal): NONE SEEN /hpf (ref 0–10)
Renal Epithel, UA: NONE SEEN /hpf
WBC, UA: >30 /hpf — AB (ref 0–5)

## 2019-01-02 LAB — URINALYSIS, COMPLETE
Bilirubin, UA: NEGATIVE
Ketones, UA: NEGATIVE
Nitrite, UA: NEGATIVE
Protein,UA: NEGATIVE
Specific Gravity, UA: 1.015 (ref 1.005–1.030)
Urobilinogen, Ur: 0.2 mg/dL (ref 0.2–1.0)
pH, UA: 5 (ref 5.0–7.5)

## 2019-01-02 MED ORDER — SULFAMETHOXAZOLE-TRIMETHOPRIM 800-160 MG PO TABS: 1.0000 | ORAL_TABLET | Freq: Two times a day (BID) | ORAL | 0 refills | Status: AC

## 2019-01-02 NOTE — Patient Instructions (Signed)
Urinary Tract Infection, Adult A urinary tract infection (UTI) is an infection of any part of the urinary tract. The urinary tract includes:  The kidneys.  The ureters.  The bladder.  The urethra. These organs make, store, and get rid of pee (urine) in the body. What are the causes? This is caused by germs (bacteria) in your genital area. These germs grow and cause swelling (inflammation) of your urinary tract. What increases the risk? You are more likely to develop this condition if:  You have a small, thin tube (catheter) to drain pee.  You cannot control when you pee or poop (incontinence).  You are male, and: ? You use these methods to prevent pregnancy: ? A medicine that kills sperm (spermicide). ? A device that blocks sperm (diaphragm). ? You have low levels of a male hormone (estrogen). ? You are pregnant.  You have genes that add to your risk.  You are sexually active.  You take antibiotic medicines.  You have trouble peeing because of: ? A prostate that is bigger than normal, if you are male. ? A blockage in the part of your body that drains pee from the bladder (urethra). ? A kidney stone. ? A nerve condition that affects your bladder (neurogenic bladder). ? Not getting enough to drink. ? Not peeing often enough.  You have other conditions, such as: ? Diabetes. ? A weak disease-fighting system (immune system). ? Sickle cell disease. ? Gout. ? Injury of the spine. What are the signs or symptoms? Symptoms of this condition include:  Needing to pee right away (urgently).  Peeing often.  Peeing small amounts often.  Pain or burning when peeing.  Blood in the pee.  Pee that smells bad or not like normal.  Trouble peeing.  Pee that is cloudy.  Fluid coming from the vagina, if you are male.  Pain in the belly or lower back. Other symptoms include:  Throwing up (vomiting).  No urge to eat.  Feeling mixed up (confused).  Being tired  and grouchy (irritable).  A fever.  Watery poop (diarrhea). How is this treated? This condition may be treated with:  Antibiotic medicine.  Other medicines.  Drinking enough water. Follow these instructions at home:  Medicines  Take over-the-counter and prescription medicines only as told by your doctor.  If you were prescribed an antibiotic medicine, take it as told by your doctor. Do not stop taking it even if you start to feel better. General instructions  Make sure you: ? Pee until your bladder is empty. ? Do not hold pee for a long time. ? Empty your bladder after sex. ? Wipe from front to back after pooping if you are a male. Use each tissue one time when you wipe.  Drink enough fluid to keep your pee pale yellow.  Keep all follow-up visits as told by your doctor. This is important. Contact a doctor if:  You do not get better after 1-2 days.  Your symptoms go away and then come back. Get help right away if:  You have very bad back pain.  You have very bad pain in your lower belly.  You have a fever.  You are sick to your stomach (nauseous).  You are throwing up. Summary  A urinary tract infection (UTI) is an infection of any part of the urinary tract.  This condition is caused by germs in your genital area.  There are many risk factors for a UTI. These include having a small, thin   tube to drain pee and not being able to control when you pee or poop.  Treatment includes antibiotic medicines for germs.  Drink enough fluid to keep your pee pale yellow. This information is not intended to replace advice given to you by your health care provider. Make sure you discuss any questions you have with your health care provider. Document Released: 06/29/2007 Document Revised: 12/28/2017 Document Reviewed: 07/20/2017 Elsevier Patient Education  2020 Elsevier Inc.  

## 2019-01-02 NOTE — Progress Notes (Signed)
Subjective: CC: Urinary tract infection PCP: Dettinger, Fransisca Kaufmann, MD [id number] [address] is a 73 y.o. male presenting to clinic today for:  1.  Urinary tract infection Patient reports a 1 day history of mild dysuria and chills last evening that have since resolved.  He denies any states abdominal pain, nausea, vomiting, fevers, hematuria.  He saw his urologist, Dr. Jeffie Pollock, yesterday and had a urine sample done but notes that he has not been contacted about the results and that is why he is here today.  He had a recent urinary tract infection in November and has had several over the course of this last year.  At this point, not on chronic suppression.  Unsure if there are plans to start.  ROS: Per HPI  Allergies  Allergen Reactions  . Phenothiazines Anaphylaxis  . Actos [Pioglitazone] Swelling   Past Medical History:  Diagnosis Date  . Arthritis   . Atrial fibrillation ([place])   . BPH (benign prostatic hypertrophy) 04/15/2010  . Cataract   . Colon polyps   . DEPRESSION 01/22/2009  . Heart valve replaced   . HYPERCHOLESTEROLEMIA 01/22/2009  . HYPERTENSION 01/22/2009   Dr. [name]  . 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 Morbid obesity ([place])   . Tremors of nervous system    ?ptsd  . Ulcer     Current Outpatient Medications:  .  acetaminophen (TYLENOL) 500 MG tablet, Take 1,000 mg by mouth every 6 (six) hours as needed (pain)., Disp: , Rfl:  .  cholecalciferol (VITAMIN D) 1000 units tablet, Take 1,000 Units by mouth daily. , Disp: , Rfl:  .  ELIQUIS 5 MG TABS tablet, TAKE ONE TABLET BY MOUTH TWICE DAILY, Disp: 60 tablet, Rfl: 5 .  fluticasone (FLONASE) 50 MCG/ACT nasal spray, INSTILL TWO SPRAYS IN EACH NOSTRIL DAILY, Disp: 16 g, Rfl: 6 .  furosemide (LASIX) 20 MG tablet, Take 1 tablet (20 mg total) by mouth daily., Disp: 30 tablet, Rfl: 1 .  gabapentin (NEURONTIN) 300 MG capsule, Take 1 capsule (300 mg total) by mouth 2 (two) times daily., Disp: 180  capsule, Rfl: 3 .  glucose blood test strip, 1 strip by Does not apply route 3 (three) times daily. Uses true metrix strips (diabetic club), Disp: , Rfl: 12 .  insulin detemir (LEVEMIR) 100 UNIT/ML injection, Inject 0.47-0.48 mLs (47-48 Units total) into the skin 2 (two) times daily. (Patient taking differently: Inject 42 Units into the skin 2 (two) times daily. ), Disp: 10 mL, Rfl:  .  Insulin Pen Needle (B-D ULTRAFINE III SHORT PEN) 31G X 8 MM MISC, Use to inject Victoza qd, Disp: 100 each, Rfl: 5 .  levothyroxine (SYNTHROID, LEVOTHROID) 200 MCG tablet, Take 1 tablet (200 mcg total) by mouth daily before breakfast., Disp: 30 tablet, Rfl: 1 .  lisinopril (ZESTRIL) 5 MG tablet, Take 1 tablet (5 mg total) by mouth daily., Disp: 90 tablet, Rfl: 1 .  metFORMIN (GLUCOPHAGE) 1000 MG tablet, TAKE 1 TABLET BY MOUTH TWICE DAILY WITH A MEAL., Disp: 180 tablet, Rfl: 0 .  Multiple Vitamins-Minerals (MENS MULTI VITAMIN & MINERAL PO), Take 1 tablet by mouth daily.  , Disp: , Rfl:  .  naproxen (NAPROSYN) 500 MG tablet, Take 1 tablet (500 mg total) by mouth 2 (two) times daily with a meal., Disp: 30 tablet, Rfl: 0 .  Omega-3 Fatty Acids (FISH OIL) 1200 MG CAPS, Take 1,200-2,400 mg by mouth 2 (two) times daily. Take 1 capsule (1200 mg) by mouth daily  at noon and 2 capsules (2400 mg) daily at bedtime, Disp: , Rfl:  .  Semaglutide (OZEMPIC) 0.25 or 0.5 MG/DOSE SOPN, Inject into the skin., Disp: , Rfl:  .  simvastatin (ZOCOR) 80 MG tablet, Take 0.5 tablets (40 mg total) by mouth at bedtime., Disp: 15 tablet, Rfl: 0 .  sulfamethoxazole-trimethoprim (BACTRIM DS) 800-160 MG tablet, Take 1 tablet by mouth 2 (two) times daily., Disp: , Rfl:  .  tamsulosin (FLOMAX) 0.4 MG CAPS capsule, TAKE TWO CAPSULES BY MOUTH AT BEDTIME, Disp: 180 capsule, Rfl: 0 Social History   Socioeconomic History  . Marital status: Single    Spouse name: Not on file  . Number of children: 1  . Years of education: HS  . Highest education level:  Not on file  Occupational History  . Occupation: Administrator, retired    Fish farm manager: RETIRED  Social Needs  . Financial resource strain: Not on file  . Food insecurity    Worry: Not on file    Inability: Not on file  . Transportation needs    Medical: Not on file    Non-medical: Not on file  Tobacco Use  . Smoking status: Former Smoker    Packs/day: 0.00    Years: 12.00    Pack years: 0.00    Types: Cigarettes    Quit date: 09/20/1961    Years since quitting: 57.3  . Smokeless tobacco: Never Used  Substance and Sexual Activity  . Alcohol use: Not Currently  . Drug use: No  . Sexual activity: Never    Comment: Has not smoked in 20 years  Lifestyle  . Physical activity    Days per week: Not on file    Minutes per session: Not on file  . Stress: Not on file  Relationships  . Social Herbalist on phone: Not on file    Gets together: Not on file    Attends religious service: Not on file    Active member of club or organization: Not on file    Attends meetings of clubs or organizations: Not on file    Relationship status: Not on file  . Intimate partner violence    Fear of current or ex partner: Not on file    Emotionally abused: Not on file    Physically abused: Not on file    Forced sexual activity: Not on file  Other Topics Concern  . Not on file  Social History Narrative   Lives alone.   Quit smoking approximately 28 years ago.   Long haul truck driver, lives in [place].   Family History  Problem Relation Age of Onset  . Heart disease Father   . Congestive Heart Failure Father   . Arthritis Father   . Diabetes Other   . Benign prostatic hyperplasia Brother     Objective: Office vital signs reviewed. BP 135/61   Pulse 86   Temp (!) 96.9 F (36.1 C) (Temporal)   Ht 6\' 1"  (1.854 m)   Wt 257 lb (116.6 kg)   SpO2 97%   BMI 33.91 kg/m   Physical Examination:  General: Awake, alert, nontoxic, No acute distress GU: No suprapubic tenderness  palpation.  No CVA tenderness to palpation.  Assessment/ Plan: 73 y.o. male   1. Recurrent UTI Recently treated with Septra; but perhaps not totally treated UTI.  I have renewed the Septra for an additional 7 days.  I will reach out to his urologist.  I wonder if this  patient would be a good candidate for chronic suppression.  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  Unsure if daily trimethoprim would be beneficial. - urinalysis- dip and micro - Urine culture - sulfamethoxazole-trimethoprim (BACTRIM DS) 800-160 MG tablet; Take 1 tablet by mouth 2 (two) times daily for 7 days.  Dispense: 14 tablet; Refill: 0 - CBC - Basic Metabolic Panel   Orders Placed This Encounter  Procedures  . Urine culture  . urinalysis- dip and micro  . CBC  . Basic Metabolic Panel   Meds ordered this encounter  Medications  . sulfamethoxazole-trimethoprim (BACTRIM DS) 800-160 MG tablet    Sig: Take 1 tablet by mouth 2 (two) times daily for 7 days.    Dispense:  14 tablet    Refill:  McCall, DO [place] [phone]

## 2019-01-02 NOTE — Telephone Encounter (Signed)
Spoke to pt and he states he was having chills last night, right flank pain with some dysuria. Pt has appt scheduled with Dr Darnell Level today, advised pt to keep his appt.

## 2019-01-03 LAB — BASIC METABOLIC PANEL
BUN/Creatinine Ratio: 19 (ref 10–24)
BUN: 23 mg/dL (ref 8–27)
CO2: 22 mmol/L (ref 20–29)
Calcium: 9 mg/dL (ref 8.6–10.2)
Chloride: 100 mmol/L (ref 96–106)
Creatinine, Ser: 1.18 mg/dL (ref 0.76–1.27)
GFR calc Af Amer: 69 mL/min/{1.73_m2} (ref >59)
GFR calc non Af Amer: 60 mL/min/{1.73_m2} (ref >59)
Glucose: 188 mg/dL — ABNORMAL HIGH (ref 65–99)
Potassium: 5.1 mmol/L (ref 3.5–5.2)
Sodium: 137 mmol/L (ref 134–144)

## 2019-01-03 LAB — CBC
Hematocrit: 38.5 % (ref 37.5–51.0)
Hemoglobin: 12.7 g/dL — ABNORMAL LOW (ref 13.0–17.7)
MCH: 33.2 pg — ABNORMAL HIGH (ref 26.6–33.0)
MCHC: 33 g/dL (ref 31.5–35.7)
MCV: 101 fL — ABNORMAL HIGH (ref 79–97)
Platelets: 144 10*3/uL — ABNORMAL LOW (ref 150–450)
RBC: 3.83 x10E6/uL — ABNORMAL LOW (ref 4.14–5.80)
RDW: 13.9 % (ref 11.6–15.4)
WBC: 7.6 10*3/uL (ref 3.4–10.8)

## 2019-01-04 ENCOUNTER — Ambulatory Visit (INDEPENDENT_AMBULATORY_CARE_PROVIDER_SITE_OTHER): Payer: Medicare Other | Admitting: *Deleted

## 2019-01-04 DIAGNOSIS — R001 Bradycardia, unspecified: Secondary | ICD-10-CM | POA: Diagnosis not present

## 2019-01-04 LAB — CUP PACEART REMOTE DEVICE CHECK
Battery Remaining Longevity: 103 mo
Battery Voltage: 3 V
Brady Statistic AP VP Percent: 1.11 %
Brady Statistic AP VS Percent: 0 %
Brady Statistic AS VP Percent: 98.8 %
Brady Statistic AS VS Percent: 0.09 %
Brady Statistic RA Percent Paced: 1.13 %
Brady Statistic RV Percent Paced: 99.91 %
Date Time Interrogation Session: 20201211060013
Implantable Lead Implant Date: 20180521
Implantable Lead Implant Date: 20180521
Implantable Lead Location: 753859
Implantable Lead Location: 753859
Implantable Lead Model: 3830
Implantable Lead Model: 5076
Implantable Pulse Generator Implant Date: 20180521
Lead Channel Impedance Value: 304 Ohm
Lead Channel Impedance Value: 342 Ohm
Lead Channel Impedance Value: 399 Ohm
Lead Channel Impedance Value: 418 Ohm
Lead Channel Pacing Threshold Amplitude: 0.875 V
Lead Channel Pacing Threshold Amplitude: 1 V
Lead Channel Pacing Threshold Pulse Width: 0.4 ms
Lead Channel Pacing Threshold Pulse Width: 0.4 ms
Lead Channel Sensing Intrinsic Amplitude: 1 mV
Lead Channel Sensing Intrinsic Amplitude: 1 mV
Lead Channel Sensing Intrinsic Amplitude: 19.625 mV
Lead Channel Sensing Intrinsic Amplitude: 19.625 mV
Lead Channel Setting Pacing Amplitude: 1.75 V
Lead Channel Setting Pacing Amplitude: 2.5 V
Lead Channel Setting Pacing Pulse Width: 0.4 ms
Lead Channel Setting Sensing Sensitivity: 2 mV

## 2019-01-06 LAB — URINE CULTURE

## 2019-01-08 ENCOUNTER — Inpatient Hospital Stay (HOSPITAL_COMMUNITY): Payer: Medicare Other | Attending: Hematology

## 2019-01-08 ENCOUNTER — Ambulatory Visit (INDEPENDENT_AMBULATORY_CARE_PROVIDER_SITE_OTHER): Payer: Medicare Other | Admitting: Internal Medicine

## 2019-01-08 ENCOUNTER — Encounter: Payer: Self-pay | Admitting: Internal Medicine

## 2019-01-08 ENCOUNTER — Other Ambulatory Visit: Payer: Self-pay

## 2019-01-08 VITALS — BP 127/73 | HR 84 | Temp 97.8°F | Ht 71.0 in | Wt 259.0 lb

## 2019-01-08 DIAGNOSIS — E785 Hyperlipidemia, unspecified: Secondary | ICD-10-CM

## 2019-01-08 DIAGNOSIS — Z8249 Family history of ischemic heart disease and other diseases of the circulatory system: Secondary | ICD-10-CM | POA: Diagnosis not present

## 2019-01-08 DIAGNOSIS — Z87891 Personal history of nicotine dependence: Secondary | ICD-10-CM | POA: Insufficient documentation

## 2019-01-08 DIAGNOSIS — Z8261 Family history of arthritis: Secondary | ICD-10-CM | POA: Diagnosis not present

## 2019-01-08 DIAGNOSIS — E669 Obesity, unspecified: Secondary | ICD-10-CM | POA: Diagnosis not present

## 2019-01-08 DIAGNOSIS — I10 Essential (primary) hypertension: Secondary | ICD-10-CM | POA: Diagnosis not present

## 2019-01-08 DIAGNOSIS — I4891 Unspecified atrial fibrillation: Secondary | ICD-10-CM | POA: Diagnosis not present

## 2019-01-08 DIAGNOSIS — E538 Deficiency of other specified B group vitamins: Secondary | ICD-10-CM | POA: Insufficient documentation

## 2019-01-08 DIAGNOSIS — D696 Thrombocytopenia, unspecified: Secondary | ICD-10-CM | POA: Diagnosis not present

## 2019-01-08 LAB — IRON AND TIBC
Iron: 76 ug/dL (ref 45–182)
Saturation Ratios: 22 % (ref 17.9–39.5)
TIBC: 351 ug/dL (ref 250–450)
UIBC: 275 ug/dL

## 2019-01-08 LAB — CBC WITH DIFFERENTIAL/PLATELET
Abs Immature Granulocytes: 0.04 10*3/uL (ref 0.00–0.07)
Basophils Absolute: 0 10*3/uL (ref 0.0–0.1)
Basophils Relative: 1 %
Eosinophils Absolute: 0.2 10*3/uL (ref 0.0–0.5)
Eosinophils Relative: 2 %
HCT: 38.1 % — ABNORMAL LOW (ref 39.0–52.0)
Hemoglobin: 12.8 g/dL — ABNORMAL LOW (ref 13.0–17.0)
Immature Granulocytes: 1 %
Lymphocytes Relative: 22 %
Lymphs Abs: 1.8 10*3/uL (ref 0.7–4.0)
MCH: 33.1 pg (ref 26.0–34.0)
MCHC: 33.6 g/dL (ref 30.0–36.0)
MCV: 98.4 fL (ref 80.0–100.0)
Monocytes Absolute: 0.7 10*3/uL (ref 0.1–1.0)
Monocytes Relative: 9 %
Neutro Abs: 5.6 10*3/uL (ref 1.7–7.7)
Neutrophils Relative %: 65 %
Platelets: 124 10*3/uL — ABNORMAL LOW (ref 150–400)
RBC: 3.87 MIL/uL — ABNORMAL LOW (ref 4.22–5.81)
RDW: 14.6 % (ref 11.5–15.5)
WBC: 8.4 10*3/uL (ref 4.0–10.5)
nRBC: 0 % (ref 0.0–0.2)

## 2019-01-08 LAB — COMPREHENSIVE METABOLIC PANEL
ALT: 20 U/L (ref 0–44)
AST: 27 U/L (ref 15–41)
Albumin: 3.8 g/dL (ref 3.5–5.0)
Alkaline Phosphatase: 57 U/L (ref 38–126)
Anion gap: 10 (ref 5–15)
BUN: 27 mg/dL — ABNORMAL HIGH (ref 8–23)
CO2: 23 mmol/L (ref 22–32)
Calcium: 9 mg/dL (ref 8.9–10.3)
Chloride: 99 mmol/L (ref 98–111)
Creatinine, Ser: 1.74 mg/dL — ABNORMAL HIGH (ref 0.61–1.24)
GFR calc Af Amer: 43 mL/min — ABNORMAL LOW (ref >60)
GFR calc non Af Amer: 38 mL/min — ABNORMAL LOW (ref >60)
Glucose, Bld: 220 mg/dL — ABNORMAL HIGH (ref 70–99)
Potassium: 5.5 mmol/L — ABNORMAL HIGH (ref 3.5–5.1)
Sodium: 132 mmol/L — ABNORMAL LOW (ref 135–145)
Total Bilirubin: 0.7 mg/dL (ref 0.3–1.2)
Total Protein: 6.9 g/dL (ref 6.5–8.1)

## 2019-01-08 LAB — VITAMIN B12: Vitamin B-12: 307 pg/mL (ref 180–914)

## 2019-01-08 LAB — VITAMIN D 25 HYDROXY (VIT D DEFICIENCY, FRACTURES): Vit D, 25-Hydroxy: 43.08 ng/mL (ref 30–100)

## 2019-01-08 LAB — FOLATE: Folate: 29.5 ng/mL (ref >5.9)

## 2019-01-08 LAB — LACTATE DEHYDROGENASE: LDH: 232 U/L — ABNORMAL HIGH (ref 98–192)

## 2019-01-08 LAB — FERRITIN: Ferritin: 416 ng/mL — ABNORMAL HIGH (ref 24–336)

## 2019-01-08 NOTE — Patient Instructions (Signed)
Medication Instructions:  Your physician recommends that you continue on your current medications as directed. Please refer to the Current Medication list given to you today.  *If you need a refill on your cardiac medications before your next appointment, please call your pharmacy*  Lab Work: NONE  If you have labs (blood work) drawn today and your tests are completely normal, you will receive your results only by: . MyChart Message (if you have MyChart) OR . A paper copy in the mail If you have any lab test that is abnormal or we need to change your treatment, we will call you to review the results.  Testing/Procedures: NONE   Follow-Up: At [facility], you and your health needs are our priority.  As part of our continuing mission to provide you with exceptional heart care, we have created designated Provider Care Teams.  These Care Teams include your primary Cardiologist (physician) and Advanced Practice Providers (APPs -  Physician Assistants and Nurse Practitioners) who all work together to provide you with the care you need, when you need it.  Your next appointment:   1 year(s)  The format for your next appointment:   In Person  Provider:   Gregg Taylor, MD  Other Instructions Thank you for choosing [facility]!    

## 2019-01-08 NOTE — Progress Notes (Signed)
HPI [name] returns today for followup. He is a pleasant 73 yo man with CHB, s/p PPM insertion, PAF, HTN, prior AVR, and recurrent prostate infections. He has done well in the interim though he notes that he has felt less well in the past few days. No palpitations or chest pain.  Allergies  Allergen Reactions  . Phenothiazines Anaphylaxis  . Actos [Pioglitazone] Swelling     Current Outpatient Medications  Medication Sig Dispense Refill  . acetaminophen (TYLENOL) 500 MG tablet Take 1,000 mg by mouth every 6 (six) hours as needed (pain).    . cholecalciferol (VITAMIN D) 1000 units tablet Take 1,000 Units by mouth daily.     Marland Kitchen ELIQUIS 5 MG TABS tablet TAKE ONE TABLET BY MOUTH TWICE DAILY 60 tablet 5  . fluticasone (FLONASE) 50 MCG/ACT nasal spray INSTILL TWO SPRAYS IN EACH NOSTRIL DAILY 16 g 6  . furosemide (LASIX) 20 MG tablet Take 1 tablet (20 mg total) by mouth daily. 30 tablet 1  . gabapentin (NEURONTIN) 300 MG capsule Take 1 capsule (300 mg total) by mouth 2 (two) times daily. 180 capsule 3  . glucose blood test strip 1 strip by Does not apply route 3 (three) times daily. Uses true metrix strips (diabetic club)  12  . insulin detemir (LEVEMIR) 100 UNIT/ML injection Inject 0.47-0.48 mLs (47-48 Units total) into the skin 2 (two) times daily. (Patient taking differently: Inject 42 Units into the skin 2 (two) times daily. ) 10 mL   . Insulin Pen Needle (B-D ULTRAFINE III SHORT PEN) 31G X 8 MM MISC Use to inject Victoza qd 100 each 5  . levothyroxine (SYNTHROID, LEVOTHROID) 200 MCG tablet Take 1 tablet (200 mcg total) by mouth daily before breakfast. 30 tablet 1  . lisinopril (ZESTRIL) 5 MG tablet Take 1 tablet (5 mg total) by mouth daily. 90 tablet 1  . metFORMIN (GLUCOPHAGE) 1000 MG tablet TAKE 1 TABLET BY MOUTH TWICE DAILY WITH A MEAL. 180 tablet 0  . Multiple Vitamins-Minerals (MENS MULTI VITAMIN & MINERAL PO) Take 1 tablet by mouth daily.      . naproxen (NAPROSYN) 500 MG  tablet Take 1 tablet (500 mg total) by mouth 2 (two) times daily with a meal. 30 tablet 0  . Omega-3 Fatty Acids (FISH OIL) 1200 MG CAPS Take 1,200-2,400 mg by mouth 2 (two) times daily. Take 1 capsule (1200 mg) by mouth daily at noon and 2 capsules (2400 mg) daily at bedtime    . Semaglutide (OZEMPIC) 0.25 or 0.5 MG/DOSE SOPN Inject into the skin.    Marland Kitchen simvastatin (ZOCOR) 80 MG tablet Take 0.5 tablets (40 mg total) by mouth at bedtime. 15 tablet 0  . sulfamethoxazole-trimethoprim (BACTRIM DS) 800-160 MG tablet Take 1 tablet by mouth 2 (two) times daily for 7 days. 14 tablet 0  . tamsulosin (FLOMAX) 0.4 MG CAPS capsule TAKE TWO CAPSULES BY MOUTH AT BEDTIME 180 capsule 0   No current facility-administered medications for this visit.     Past Medical History:  Diagnosis Date  . Arthritis   . Atrial fibrillation ([place])   . BPH (benign prostatic hypertrophy) 04/15/2010  . Cataract   . Colon polyps   . DEPRESSION 01/22/2009  . Heart valve replaced   . HYPERCHOLESTEROLEMIA 01/22/2009  . HYPERTENSION 01/22/2009   Dr. Percival [name]  . 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 Morbid obesity ([place])   . Tremors of nervous system    ?ptsd  . Ulcer  ROS:   All systems reviewed and negative except as noted in the HPI.   Past Surgical History:  Procedure Laterality Date  . 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 APPENDECTOMY    . bilateral cataract surg    . COLONOSCOPY  04/24/2007   [name]: normal  . COLONOSCOPY WITH ESOPHAGOGASTRODUODENOSCOPY (EGD) N/A 04/05/2012   Procedure: COLONOSCOPY WITH ESOPHAGOGASTRODUODENOSCOPY (EGD);  Surgeon: [name] Dolin, MD;  [facility];  Service: Endoscopy;  Laterality: N/A;  10;15  . DENTAL SURGERY  05/2004   Dental extractions  . EYE SURGERY Bilateral    cataract  . HEEL SPUR SURGERY Bilateral    resection of heel spur  . KNEE ARTHROSCOPY WITH LATERAL MENISECTOMY Right 04/04/2014    Procedure: KNEE ARTHROSCOPY WITH LATERAL MENISECTOMY;  Surgeon: [name] Civil, MD;  Location: [facility];  Service: Orthopedics;  Laterality: Right;  . PACEMAKER IMPLANT N/A 06/13/2016   Procedure: Pacemaker Implant- Dual Chamber;  Surgeon: [name] Lance, MD;  Location: [place] [facility];  Service: Cardiovascular;  Laterality: N/A;  . PACEMAKER INSERTION  2018  . THYROIDECTOMY  03/21/2013   DR Harlow [name]  . THYROIDECTOMY N/A 03/21/2013   Procedure: THYROIDECTOMY;  Surgeon: [name] Regal, MD;  Location: [facility];  Service: General;  Laterality: N/A;  . TONSILLECTOMY       Family History  Problem Relation Age of Onset  . Heart disease Father   . Congestive Heart Failure Father   . Arthritis Father   . Diabetes Other   . Benign prostatic hyperplasia Brother      Social History   Socioeconomic History  . Marital status: Single    Spouse name: Not on file  . Number of children: 1  . Years of education: HS  . Highest education level: Not on file  Occupational History  . Occupation: Administrator, retired    Fish farm manager: RETIRED  Tobacco Use  . Smoking status: Former Smoker    Packs/day: 0.00    Years: 12.00    Pack years: 0.00    Types: Cigarettes    Quit date: 09/20/1961    Years since quitting: 57.3  . Smokeless tobacco: Never Used  Substance and Sexual Activity  . Alcohol use: Not Currently  . Drug use: No  . Sexual activity: Never    Comment: Has not smoked in 20 years  Other Topics Concern  . Not on file  Social History Narrative   Lives alone.   Quit smoking approximately 28 years ago.   Long haul truck driver, lives in [place].   Social Determinants of Health   Financial Resource Strain:   . Difficulty of Paying Living Expenses: Not on file  Food Insecurity:   . Worried About Charity fundraiser in the Last Year: Not on file  . Ran Out of Food in the Last Year: Not on file  Transportation Needs:   . Lack of Transportation (Medical): Not on file  . Lack of  Transportation (Non-Medical): Not on file  Physical Activity:   . Days of Exercise per Week: Not on file  . Minutes of Exercise per Session: Not on file  Stress:   . Feeling of Stress : Not on file  Social Connections:   . Frequency of Communication with Friends and Family: Not on file  . Frequency of Social Gatherings with Friends and Family: Not on file  . Attends Religious Services: Not on file  .  Active Member of Clubs or Organizations: Not on file  . Attends Archivist Meetings: Not on file  . Marital Status: Not on file  Intimate Partner Violence:   . Fear of Current or Ex-Partner: Not on file  . Emotionally Abused: Not on file  . Physically Abused: Not on file  . Sexually Abused: Not on file     BP 127/73   Pulse 84   Temp 97.8 F (36.6 C)   Ht 5\' 11"  (1.803 m)   Wt 259 lb (117.5 kg)   SpO2 96%   BMI 36.12 kg/m   Physical Exam:  Well appearing NAD HEENT: Unremarkable Neck:  No JVD, no thyromegally Lymphatics:  No adenopathy Back:  No CVA tenderness Lungs:  Clear with no wheezes HEART:  Regular rate rhythm, no murmurs, no rubs, no clicks Abd:  soft, positive bowel sounds, no organomegally, no rebound, no guarding Ext:  2 plus pulses, no edema, no cyanosis, no clubbing Skin:  No rashes no nodules Neuro:  CN II through XII intact, motor grossly intact  DEVICE  Normal device function.  See PaceArt for details.   Assess/Plan: 1. Atrial flutter - today we paced the patient back to NSR.  2. Coags - he denies missing any of his meds. 3. HTN -  His bp is well controlled today. No change. 4. Diastolic heart failure - his symptoms are class 2. He will continue his current meds. I asked him to avoid salty foods.  Mikle Bosworth.D.

## 2019-01-09 ENCOUNTER — Ambulatory Visit (HOSPITAL_COMMUNITY): Payer: Medicare Other | Admitting: Nurse Practitioner

## 2019-01-09 NOTE — Addendum Note (Signed)
Addended by: Caryl Pina on: 01/09/2019 03:39 PM   Modules accepted: Orders

## 2019-01-10 ENCOUNTER — Telehealth: Payer: Self-pay | Admitting: Family Medicine

## 2019-01-10 ENCOUNTER — Other Ambulatory Visit: Payer: Self-pay

## 2019-01-10 ENCOUNTER — Inpatient Hospital Stay (HOSPITAL_BASED_OUTPATIENT_CLINIC_OR_DEPARTMENT_OTHER): Payer: Non-veteran care | Admitting: Nurse Practitioner

## 2019-01-10 DIAGNOSIS — D696 Thrombocytopenia, unspecified: Secondary | ICD-10-CM | POA: Diagnosis not present

## 2019-01-10 NOTE — Telephone Encounter (Signed)
We have done a referral to infectious disease at the recommendation from his urologist because it is getting harder and harder to treat his urinary tract infections because he is resistant to all oral antibiotics.

## 2019-01-10 NOTE — Progress Notes (Signed)
[name], [place] [id number]   CLINIC:  Medical Oncology/Hematology  PCP:  Dettinger, Fransisca Kaufmann, MD [place] [id number] [phone]   REASON FOR VISIT: Follow-up for thrombocytopenia  CURRENT THERAPY: Observation   INTERVAL HISTORY:  [name] 73 y.o. male was called for a telephone visit for thrombocytopenia.  Patient report he has been doing well since his last visit.  He denies any easy bruising or bleeding.  He denies any new rashes. Denies any nausea, vomiting, or diarrhea. Denies any new pains. Had not noticed any recent bleeding such as epistaxis, hematuria or hematochezia. Denies recent chest pain on exertion, shortness of breath on minimal exertion, pre-syncopal episodes, or palpitations. Denies any numbness or tingling in hands or feet. Denies any recent fevers, infections, or recent hospitalizations. Patient reports appetite at 100% and energy level at 75%.  He is eating well maintain his weight this time.     REVIEW OF SYSTEMS:  Review of Systems  All other systems reviewed and are negative.    PAST MEDICAL/SURGICAL HISTORY:  Past Medical History:  Diagnosis Date  . Arthritis   . Atrial fibrillation ([place])   . BPH (benign prostatic hypertrophy) 04/15/2010  . Cataract   . Colon polyps   . DEPRESSION 01/22/2009  . Heart valve replaced   . HYPERCHOLESTEROLEMIA 01/22/2009  . HYPERTENSION 01/22/2009   Dr. [name]  . 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 Morbid obesity ([place])   . Tremors of nervous system    ?ptsd  . Ulcer    Past Surgical History:  Procedure Laterality Date  . 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 APPENDECTOMY    . bilateral cataract surg    . COLONOSCOPY  04/24/2007   [name]: normal  . COLONOSCOPY WITH ESOPHAGOGASTRODUODENOSCOPY (EGD) N/A 04/05/2012   Procedure: COLONOSCOPY WITH ESOPHAGOGASTRODUODENOSCOPY (EGD);   Surgeon: Daneil Dolin, MD;  [facility];  Service: Endoscopy;  Laterality: N/A;  10;15  . DENTAL SURGERY  05/2004   Dental extractions  . EYE SURGERY Bilateral    cataract  . HEEL SPUR SURGERY Bilateral    resection of heel spur  . KNEE ARTHROSCOPY WITH LATERAL MENISECTOMY Right 04/04/2014   Procedure: KNEE ARTHROSCOPY WITH LATERAL MENISECTOMY;  Surgeon: Carole Civil, MD;  Location: [facility];  Service: Orthopedics;  Laterality: Right;  . PACEMAKER IMPLANT N/A 06/13/2016   Procedure: Pacemaker Implant- Dual Chamber;  Surgeon: Evans Lance, MD;  Location: [place] [facility];  Service: Cardiovascular;  Laterality: N/A;  . PACEMAKER INSERTION  2018  . THYROIDECTOMY  03/21/2013   DR [name]  . THYROIDECTOMY N/A 03/21/2013   Procedure: THYROIDECTOMY;  Surgeon: [name], MD;  Location: [facility];  Service: General;  Laterality: N/A;  . TONSILLECTOMY       SOCIAL HISTORY:  Social History   Socioeconomic History  . Marital status: Single    Spouse name: Not on file  . Number of children: 1  . Years of education: HS  . Highest education level: Not on file  Occupational History  . Occupation: Administrator, retired    Fish farm manager: RETIRED  Tobacco Use  . Smoking status: Former Smoker    Packs/day: 0.00    Years: 12.00    Pack years: 0.00    Types: Cigarettes    Quit date: 09/20/1961    Years since quitting: 57.3  . Smokeless  tobacco: Never Used  Substance and Sexual Activity  . Alcohol use: Not Currently  . Drug use: No  . Sexual activity: Never    Comment: Has not smoked in 20 years  Other Topics Concern  . Not on file  Social History Narrative   Lives alone.   Quit smoking approximately 28 years ago.   Long haul truck driver, lives in [place].   Social Determinants of Health   Financial Resource Strain:   . Difficulty of Paying Living Expenses: Not on file  Food Insecurity:   . Worried About Charity fundraiser in the Last Year: Not on file  . Ran Out  of Food in the Last Year: Not on file  Transportation Needs:   . Lack of Transportation (Medical): Not on file  . Lack of Transportation (Non-Medical): Not on file  Physical Activity:   . Days of Exercise per Week: Not on file  . Minutes of Exercise per Session: Not on file  Stress:   . Feeling of Stress : Not on file  Social Connections:   . Frequency of Communication with Friends and Family: Not on file  . Frequency of Social Gatherings with Friends and Family: Not on file  . Attends Religious Services: Not on file  . Active Member of Clubs or Organizations: Not on file  . Attends Archivist Meetings: Not on file  . Marital Status: Not on file  Intimate Partner Violence:   . Fear of Current or Ex-Partner: Not on file  . Emotionally Abused: Not on file  . Physically Abused: Not on file  . Sexually Abused: Not on file    FAMILY HISTORY:  Family History  Problem Relation Age of Onset  . Heart disease Father   . Congestive Heart Failure Father   . Arthritis Father   . Diabetes Other   . Benign prostatic hyperplasia Brother     CURRENT MEDICATIONS:  Outpatient Encounter Medications as of 01/10/2019  Medication Sig  . acetaminophen (TYLENOL) 500 MG tablet Take 1,000 mg by mouth every 6 (six) hours as needed (pain).  . cholecalciferol (VITAMIN D) 1000 units tablet Take 1,000 Units by mouth daily.   Marland Kitchen ELIQUIS 5 MG TABS tablet TAKE ONE TABLET BY MOUTH TWICE DAILY  . fluticasone (FLONASE) 50 MCG/ACT nasal spray INSTILL TWO SPRAYS IN EACH NOSTRIL DAILY  . furosemide (LASIX) 20 MG tablet Take 1 tablet (20 mg total) by mouth daily.  Marland Kitchen gabapentin (NEURONTIN) 300 MG capsule Take 1 capsule (300 mg total) by mouth 2 (two) times daily.  Marland Kitchen glucose blood test strip 1 strip by Does not apply route 3 (three) times daily. Uses true metrix strips (diabetic club)  . insulin detemir (LEVEMIR) 100 UNIT/ML injection Inject 0.47-0.48 mLs (47-48 Units total) into the skin 2 (two) times  daily. (Patient taking differently: Inject 42 Units into the skin 2 (two) times daily. )  . Insulin Pen Needle (B-D ULTRAFINE III SHORT PEN) 31G X 8 MM MISC Use to inject Victoza qd  . levothyroxine (SYNTHROID, LEVOTHROID) 200 MCG tablet Take 1 tablet (200 mcg total) by mouth daily before breakfast.  . lisinopril (ZESTRIL) 5 MG tablet Take 1 tablet (5 mg total) by mouth daily.  . metFORMIN (GLUCOPHAGE) 1000 MG tablet TAKE 1 TABLET BY MOUTH TWICE DAILY WITH A MEAL.  . Multiple Vitamins-Minerals (MENS MULTI VITAMIN & MINERAL PO) Take 1 tablet by mouth daily.    . naproxen (NAPROSYN) 500 MG tablet Take 1 tablet (500 mg  total) by mouth 2 (two) times daily with a meal.  . Omega-3 Fatty Acids (FISH OIL) 1200 MG CAPS Take 1,200-2,400 mg by mouth 2 (two) times daily. Take 1 capsule (1200 mg) by mouth daily at noon and 2 capsules (2400 mg) daily at bedtime  . Semaglutide (OZEMPIC) 0.25 or 0.5 MG/DOSE SOPN Inject into the skin.  Marland Kitchen simvastatin (ZOCOR) 80 MG tablet Take 0.5 tablets (40 mg total) by mouth at bedtime.  . tamsulosin (FLOMAX) 0.4 MG CAPS capsule TAKE TWO CAPSULES BY MOUTH AT BEDTIME   No facility-administered encounter medications on file as of 01/10/2019.    ALLERGIES:  Allergies  Allergen Reactions  . Phenothiazines Anaphylaxis  . Actos [Pioglitazone] Swelling     Vital signs: -Deferred due to telephone visit   Physical Exam -Deferred due to telephone visit -Patient was alert and oriented over the phone and in no acute distress.  LABORATORY DATA:  I have reviewed the labs as listed.  CBC    Component Value Date/Time   WBC 8.4 01/08/2019 1027   RBC 3.87 (L) 01/08/2019 1027   HGB 12.8 (L) 01/08/2019 1027   HGB 12.7 (L) 01/02/2019 1430   HCT 38.1 (L) 01/08/2019 1027   HCT 38.5 01/02/2019 1430   PLT 124 (L) 01/08/2019 1027   PLT 144 (L) 01/02/2019 1430   MCV 98.4 01/08/2019 1027   MCV 101 (H) 01/02/2019 1430   MCH 33.1 01/08/2019 1027   MCHC 33.6 01/08/2019 1027   RDW  14.6 01/08/2019 1027   RDW 13.9 01/02/2019 1430   LYMPHSABS 1.8 01/08/2019 1027   LYMPHSABS 2.1 11/20/2017 0826   MONOABS 0.7 01/08/2019 1027   EOSABS 0.2 01/08/2019 1027   EOSABS 0.2 11/20/2017 0826   BASOSABS 0.0 01/08/2019 1027   BASOSABS 0.1 11/20/2017 0826   CMP Latest Ref Rng & Units 01/08/2019 01/02/2019 12/28/2018  Glucose 70 - 99 mg/dL 220(H) 188(H) 231(H)  BUN 8 - 23 mg/dL 27(H) 23 21  Creatinine 0.61 - 1.24 mg/dL 1.74(H) 1.18 1.52(H)  Sodium 135 - 145 mmol/L 132(L) 137 134  Potassium 3.5 - 5.1 mmol/L 5.5(H) 5.1 5.8(H)  Chloride 98 - 111 mmol/L 99 100 102  CO2 22 - 32 mmol/L 23 22 20   Calcium 8.9 - 10.3 mg/dL 9.0 9.0 9.0  Total Protein 6.5 - 8.1 g/dL 6.9 - -  Total Bilirubin 0.3 - 1.2 mg/dL 0.7 - -  Alkaline Phos 38 - 126 U/L 57 - -  AST 15 - 41 U/L 27 - -  ALT 0 - 44 U/L 20 - -    All questions were answered to patient's stated satisfaction. Encouraged patient to call with any new concerns or questions before his next visit to the cancer center and we can certain see him sooner, if needed.      ASSESSMENT & PLAN:   Thrombocytopenia ([place]) 1.  Thrombocytopenia: - Records show he has had thrombocytopenia since 04/2017.  At that time he was diagnosed with serratia UTI.  He denied ever being treated for the UTI. - It was discussed with him that an infection could play a role in the thrombocytopenia.  And he was placed on Bactrim DS 1 tab twice daily for 10 days. - Blood work was sent off for HIV and hepatitis testing which was negative.  Peripheral smear showed no fragmentation. -He was also complaining about joint pain.  SPEP, C-reactive protein, sed rate, and rheumatoid factor were all negative. - Labs done on 01/08/2019 showed his platelet count 24, hemoglobin  12.8, LDH 160, WBC 8.4 - We will continue to monitor his labs and if platelet count drops below 50,000 he will be set up for a bone marrow aspirate and biopsy for further evaluation. - He will follow-up in 6 months  with labs.  2.  Vitamin B12 deficiency: - Labs on 01/08/2019 showed his vitamin B12 level 307. - We recommend that he take vitamin B12 oral daily. -We will recheck his levels and 6 months.  3.  Elevated creatinine: -Patient's creatinine on 01/02/2019 was 1.18.  Creatinine on 01/08/2019 was 1.74. -Patient reports he has had a severe UTI and he just completed his antibiotics. -He has a follow-up appointment with his primary care doctor within the next month to recheck his kidney function.  4.  Health maintenance: - He has a history of smoking. -CT of the chest done 08/30/2017 was negative.      Orders placed this encounter:  Orders Placed This Encounter  Procedures  . Lactate dehydrogenase  . CBC with Differential/Platelet  . Comprehensive metabolic panel  . Vitamin B12  . Vitamin D 25 hydroxy      Francene Finders, FNP-C [facility] [phone]

## 2019-01-10 NOTE — Telephone Encounter (Signed)
Patient wants to know why he was sent to infections disease for UTI?

## 2019-01-10 NOTE — Assessment & Plan Note (Signed)
1.  Thrombocytopenia: - Records show he has had thrombocytopenia since 04/2017.  At that time he was diagnosed with serratia UTI.  He denied ever being treated for the UTI. - It was discussed with him that an infection could play a role in the thrombocytopenia.  And he was placed on Bactrim DS 1 tab twice daily for 10 days. - Blood work was sent off for HIV and hepatitis testing which was negative.  Peripheral smear showed no fragmentation. -He was also complaining about joint pain.  SPEP, C-reactive protein, sed rate, and rheumatoid factor were all negative. - Labs done on 01/08/2019 showed his platelet count 24, hemoglobin 12.8, LDH 160, WBC 8.4 - We will continue to monitor his labs and if platelet count drops below 50,000 he will be set up for a bone marrow aspirate and biopsy for further evaluation. - He will follow-up in 6 months with labs.  2.  Vitamin B12 deficiency: - Labs on 01/08/2019 showed his vitamin B12 level 307. - We recommend that he take vitamin B12 oral daily. -We will recheck his levels and 6 months.  3.  Elevated creatinine: -Patient's creatinine on 01/02/2019 was 1.18.  Creatinine on 01/08/2019 was 1.74. -Patient reports he has had a severe UTI and he just completed his antibiotics. -He has a follow-up appointment with his primary care doctor within the next month to recheck his kidney function.  4.  Health maintenance: - He has a history of smoking. -CT of the chest done 08/30/2017 was negative.

## 2019-01-10 NOTE — Patient Instructions (Signed)
[facility] Discharge Instructions  Follow up in 6 months with labs    Thank you for choosing  Cancer Center at [facility] to provide your oncology and hematology care.  To afford each patient quality time with our provider, please arrive at least 15 minutes before your scheduled appointment time.   If you have a lab appointment with the Cancer Center please come in thru the Main Entrance and check in at the main information desk.  You need to re-schedule your appointment should you arrive 10 or more minutes late.  We strive to give you quality time with our providers, and arriving late affects you and other patients whose appointments are after yours.  Also, if you no show three or more times for appointments you may be dismissed from the clinic at the providers discretion.     Again, thank you for choosing [facility].  Our hope is that these requests will decrease the amount of time that you wait before being seen by our physicians.       _____________________________________________________________  Should you have questions after your visit to [facility], please contact our office at [phone] between the hours of 8:00 a.m. and 4:30 p.m.  Voicemails left after 4:00 p.m. will not be returned until the following business day.  For prescription refill requests, have your pharmacy contact our office and allow 72 hours.    Due to Covid, you will need to wear a mask upon entering the hospital. If you do not have a mask, a mask will be given to you at the Main Entrance upon arrival. For doctor visits, patients may have 1 support person with them. For treatment visits, patients can not have anyone with them due to social distancing guidelines and our immunocompromised population.      

## 2019-01-10 NOTE — Telephone Encounter (Signed)
Patient aware and verbalized understanding. °

## 2019-01-27 DIAGNOSIS — Z7189 Other specified counseling: Secondary | ICD-10-CM | POA: Insufficient documentation

## 2019-01-27 DIAGNOSIS — I2510 Atherosclerotic heart disease of native coronary artery without angina pectoris: Secondary | ICD-10-CM | POA: Insufficient documentation

## 2019-01-27 NOTE — Progress Notes (Signed)
Cardiology Office Note   Date:  01/30/2019   ID:  [name], DOB [birth year], MRN [id number]  PCP:  Dettinger, Fransisca Kaufmann, MD  Cardiologist:   No primary care provider on file.  You saw the infectious disease doctor here Chief Complaint  Patient presents with  . Coronary Artery Disease      History of Present Illness: [name] is a 74 y.o. male who for followup of aortic valve replacement, follow up of CAD, atrial fib and pacemaker placement for symptomatic bradycardia.  Since I last saw him he has seen Dr. Lovena Le and was in atrial flutter and this was pace terminated during that appt.  I reviewed the EP notes for this appt.     Since I last saw him he has had some chronic urinary tract infections and I reviewed an ID note from yesterday.  He is thought to be colonized.  He has had some low blood pressures.  He reports his systolics are sometimes in the 90s.  He has had some dizziness associated with this.  He has not had any frank syncope.  He denies any chest pressure, neck or arm discomfort.  He has not had any weight gain or edema.  In fact he has continued to keep his weight down is lost a little bit more.   Past Medical History:  Diagnosis Date  . Arthritis   . Atrial fibrillation ([place])   . BPH (benign prostatic hypertrophy) 04/15/2010  . Cataract   . Colon polyps   . DEPRESSION 01/22/2009  . Heart valve replaced   . HYPERCHOLESTEROLEMIA 01/22/2009  . HYPERTENSION 01/22/2009   Dr. [name]  . 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 Morbid obesity ([place])   . Tremors of nervous system    ?ptsd  . Ulcer     Past Surgical History:  Procedure Laterality Date  . 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 APPENDECTOMY    . bilateral cataract surg    . COLONOSCOPY  04/24/2007   [name]: normal  . COLONOSCOPY WITH ESOPHAGOGASTRODUODENOSCOPY (EGD) N/A 04/05/2012   Procedure: COLONOSCOPY WITH  ESOPHAGOGASTRODUODENOSCOPY (EGD);  Surgeon: Daneil Dolin, MD;  Location: [facility];  Service: Endoscopy;  Laterality: N/A;  10;15  . DENTAL SURGERY  05/2004   Dental extractions  . EYE SURGERY Bilateral    cataract  . HEEL SPUR SURGERY Bilateral    resection of heel spur  . KNEE ARTHROSCOPY WITH LATERAL MENISECTOMY Right 04/04/2014   Procedure: KNEE ARTHROSCOPY WITH LATERAL MENISECTOMY;  Surgeon: Carole Civil, MD;  Location: [facility];  Service: Orthopedics;  Laterality: Right;  . PACEMAKER IMPLANT N/A 06/13/2016   Procedure: Pacemaker Implant- Dual Chamber;  Surgeon: Evans Lance, MD;  Location: [facility];  Service: Cardiovascular;  Laterality: N/A;  . PACEMAKER INSERTION  2018  . THYROIDECTOMY  03/21/2013   DR [name]  . THYROIDECTOMY N/A 03/21/2013   Procedure: THYROIDECTOMY;  Surgeon: [name], MD;  Location: [facility];  Service: General;  Laterality: N/A;  . TONSILLECTOMY       Current Outpatient Medications  Medication Sig Dispense Refill  . acetaminophen (TYLENOL) 500 MG tablet Take 1,000 mg by mouth every 6 (six) hours as needed (pain).    . cholecalciferol (VITAMIN D) 1000 units tablet Take 1,000 Units by mouth daily.     Marland Kitchen ELIQUIS 5 MG TABS tablet TAKE ONE TABLET BY MOUTH TWICE  DAILY 60 tablet 5  . fluticasone (FLONASE) 50 MCG/ACT nasal spray INSTILL TWO SPRAYS IN EACH NOSTRIL DAILY 16 g 6  . furosemide (LASIX) 20 MG tablet Take 1 tablet (20 mg total) by mouth daily. 30 tablet 1  . gabapentin (NEURONTIN) 300 MG capsule Take 1 capsule (300 mg total) by mouth 2 (two) times daily. 180 capsule 3  . insulin detemir (LEVEMIR) 100 UNIT/ML injection Inject 0.47-0.48 mLs (47-48 Units total) into the skin 2 (two) times daily. (Patient taking differently: Inject 42 Units into the skin 2 (two) times daily. ) 10 mL   . levothyroxine (SYNTHROID, LEVOTHROID) 200 MCG tablet Take 1 tablet (200 mcg total) by mouth daily before breakfast. 30 tablet 1  . metFORMIN (GLUCOPHAGE)  1000 MG tablet TAKE 1 TABLET BY MOUTH TWICE DAILY WITH A MEAL. 180 tablet 0  . Omega-3 Fatty Acids (FISH OIL) 1200 MG CAPS Take 1,200-2,400 mg by mouth 2 (two) times daily. Take 1 capsule (1200 mg) by mouth daily at noon and 2 capsules (2400 mg) daily at bedtime    . Semaglutide (OZEMPIC) 0.25 or 0.5 MG/DOSE SOPN Inject into the skin.    Marland Kitchen simvastatin (ZOCOR) 80 MG tablet Take 0.5 tablets (40 mg total) by mouth at bedtime. 15 tablet 0  . tamsulosin (FLOMAX) 0.4 MG CAPS capsule TAKE TWO CAPSULES BY MOUTH AT BEDTIME 180 capsule 0  . traMADol (ULTRAM) 50 MG tablet Take 50 mg by mouth 2 (two) times daily.    Marland Kitchen glucose blood test strip 1 strip by Does not apply route 3 (three) times daily. Uses true metrix strips (diabetic club)  12  . Insulin Pen Needle (B-D ULTRAFINE III SHORT PEN) 31G X 8 MM MISC Use to inject Victoza qd 100 each 5  . Multiple Vitamins-Minerals (MENS MULTI VITAMIN & MINERAL PO) Take 1 tablet by mouth daily.      . naproxen (NAPROSYN) 500 MG tablet Take 1 tablet (500 mg total) by mouth 2 (two) times daily with a meal. 30 tablet 0   No current facility-administered medications for this visit.    Allergies:   Phenothiazines and Actos [pioglitazone]    ROS:  Please see the history of present illness.   Otherwise, review of systems are positive for none.   All other systems are reviewed and negative.    PHYSICAL EXAM: VS:  BP 110/68   Pulse 81   Ht 6\' 1"  (1.854 m)   Wt 257 lb (116.6 kg)   BMI 33.91 kg/m  , BMI Body mass index is 33.91 kg/m. GENERAL:  Well appearing NECK:  No jugular venous distention, waveform within normal limits, carotid upstroke brisk and symmetric, no bruits, no thyromegaly LUNGS:  Clear to auscultation bilaterally CHEST:  Well healed sternotomy scar and device pocket.  HEART:  PMI not displaced or sustained,S1 and S2 within normal limits, no S3, no S4, no clicks, no rubs, 2 out of 6 brief apical systolic murmur nonradiating murmurs ABD:  Flat, positive  bowel sounds normal in frequency in pitch, no bruits, no rebound, no guarding, no midline pulsatile mass, no hepatomegaly, no splenomegaly EXT:  2 plus pulses throughout, no edema, no cyanosis no clubbing   EKG:  EKG is ordered today. The ekg ordered today demonstrates sinus rhythm with ventricular pacing 100% capture   Recent Labs: 11/30/2018: TSH 0.434 01/08/2019: ALT 20; BUN 27; Creatinine, Ser 1.74; Hemoglobin 12.8; Platelets 124; Potassium 5.5; Sodium 132    Lipid Panel    Component Value Date/Time  CHOL 124 11/30/2018 1329   CHOL 136 08/02/2012 1117   TRIG 82 11/30/2018 1329   TRIG 98 12/09/2013 1054   TRIG 108 08/02/2012 1117   HDL 47 11/30/2018 1329   HDL 49 12/09/2013 1054   HDL 36 (L) 08/02/2012 1117   CHOLHDL 2.6 11/30/2018 1329   CHOLHDL 3.0 06/12/2016 1531   VLDL 28 06/12/2016 1531   LDLCALC 61 11/30/2018 1329   LDLCALC 95 08/30/2013 0812   LDLCALC 78 08/02/2012 1117      Wt Readings from Last 3 Encounters:  01/30/19 257 lb (116.6 kg)  01/29/19 274 lb (124.3 kg)  01/08/19 259 lb (117.5 kg)      Other studies Reviewed: Additional studies/ records that were reviewed today include: ID records. Review of the above records demonstrates:  Please see elsewhere in the note.     ASSESSMENT AND PLAN:  CAD: He had a low risk nuclear study in 2017.  The patient has no new sypmtoms.  No further cardiovascular testing is indicated.  We will continue with aggressive risk reduction and meds as listed.  CHRONIC SYSTOLIC AND DIASTOLIC HF:   Seems to be euvolemic.  His last ejection fraction in 2019 was 60 to 65%.  No change in therapy.    AORTIC VALVE REPLACEMENT, HX OF: He had normal valve function in 2019.  No further imaging.   HYPERTENSION:  His blood pressure actually seems to be running low.  He is not on any antihypertensives.  He has received a blood pressure cuff but he says he does not use it and I invited him to bring it back to [name] and we will teach  him how to use it.  I think there is a small chance he might need midodrine.  He will let me know if he has continued symptoms or low blood pressures.   CAROTID OCCLUSIVE DISEASE -  I will plan follow up in the future after the pandemic is over.   PPM: I reviewed his most recent device interrogation and he has normal function.  It does look like he has had mode switching and paroxysmal arrhythmias.  He has normal function with results as below.  I reviewed the most recent interrogation.   PAROXYSMAL ATRIAL FIB - He tolerates anticoagulation.  No change in therapy.  DM: A1C was 8.0 up from his previous.  This is followed by Dettinger, Fransisca Kaufmann, MD  COVID EDUCATION:  We discussed the vaccine.  Current medicines are reviewed at length with the patient today.  The patient does not have concerns regarding medicines.  The following changes have been made:  no change  Labs/ tests ordered today include: None  Orders Placed This Encounter  Procedures  . EKG 12-Lead     Disposition:   FU with me in one year.     Signed, Minus Breeding, MD  01/30/2019 1:21 PM    [facility]

## 2019-01-29 ENCOUNTER — Other Ambulatory Visit: Payer: Self-pay

## 2019-01-29 ENCOUNTER — Telehealth: Payer: Self-pay | Admitting: Family Medicine

## 2019-01-29 ENCOUNTER — Encounter: Payer: Self-pay | Admitting: Internal Medicine

## 2019-01-29 ENCOUNTER — Ambulatory Visit (INDEPENDENT_AMBULATORY_CARE_PROVIDER_SITE_OTHER): Payer: Medicare Other | Admitting: Internal Medicine

## 2019-01-29 DIAGNOSIS — R358 Other polyuria: Secondary | ICD-10-CM | POA: Diagnosis not present

## 2019-01-29 DIAGNOSIS — R350 Frequency of micturition: Secondary | ICD-10-CM | POA: Diagnosis not present

## 2019-01-29 NOTE — Telephone Encounter (Signed)
Spoke with pt and he says he went to a doctor in [place] this morning and his BP was 98/61 and he just wanted to make sure this was ok. His diastolic has been in the 0000000 and pt isn't feeling dizzy or lightheaded. Advised pt to monitor and to call back if he starts to get dizzy/lightheaded or his BP readings get lower.

## 2019-01-30 ENCOUNTER — Encounter: Payer: Self-pay | Admitting: Cardiology

## 2019-01-30 ENCOUNTER — Ambulatory Visit (INDEPENDENT_AMBULATORY_CARE_PROVIDER_SITE_OTHER): Payer: Medicare Other | Admitting: Cardiology

## 2019-01-30 ENCOUNTER — Encounter: Payer: Self-pay | Admitting: Internal Medicine

## 2019-01-30 ENCOUNTER — Other Ambulatory Visit: Payer: Self-pay

## 2019-01-30 VITALS — BP 110/68 | HR 81 | Ht 73.0 in | Wt 257.0 lb

## 2019-01-30 DIAGNOSIS — I2510 Atherosclerotic heart disease of native coronary artery without angina pectoris: Secondary | ICD-10-CM

## 2019-01-30 DIAGNOSIS — I10 Essential (primary) hypertension: Secondary | ICD-10-CM

## 2019-01-30 DIAGNOSIS — Z7189 Other specified counseling: Secondary | ICD-10-CM

## 2019-01-30 DIAGNOSIS — Z029 Encounter for administrative examinations, unspecified: Secondary | ICD-10-CM

## 2019-01-30 DIAGNOSIS — I480 Paroxysmal atrial fibrillation: Secondary | ICD-10-CM

## 2019-01-30 DIAGNOSIS — I359 Nonrheumatic aortic valve disorder, unspecified: Secondary | ICD-10-CM | POA: Diagnosis not present

## 2019-01-30 NOTE — Patient Instructions (Signed)
Medication Instructions:  The current medical regimen is effective;  continue present plan and medications.  *If you need a refill on your cardiac medications before your next appointment, please call your pharmacy*  Follow-Up: At [facility], you and your health needs are our priority.  As part of our continuing mission to provide you with exceptional heart care, we have created designated Provider Care Teams.  These Care Teams include your primary Cardiologist (physician) and Advanced Practice Providers (APPs -  Physician Assistants and Nurse Practitioners) who all work together to provide you with the care you need, when you need it.  Your next appointment:   12 month(s)  The format for your next appointment:   In Person  Provider:   James Hochrein, MD  Thank you for choosing [facility]!!     

## 2019-01-30 NOTE — Progress Notes (Signed)
[facility]      Reason for Consult: recurrent UTI    Referring Physician: Dr. Warrick Parisian    Patient ID: [name], male    DOB: [birth year], 74 y.o.   MRN: [id number]  HPI:   Here for evaluation of possible recurrent UTI.   He reports a history of about 1 year of symptoms mainly with frequency of urination.  Some occasional dysuria.  No associated fever or chills.  He has had multiple specimens sent by his PCP and he reports his urologist, Dr. Ralene Muskrat PA also sends urine studies, though records of those are not available to me. He has had positive cultures including a recent culture with Enterobacter that is intermediate to carbapenem.  Previous Enterobacter was ESBL in November.  Also with group B Strep, Morganella morganii, and Serratia in 2019.  His urinalyses done over the year have had significant glucosuria associated with the leukocytes except for one sample noted to have no glucose and no leukocytes in September.  Some samples have had numerous epithelial cells.   Previous record reviewed in Epic and partially summarized above.    Past Medical History:  Diagnosis Date  . Arthritis   . Atrial fibrillation ([place])   . BPH (benign prostatic hypertrophy) 04/15/2010  . Cataract   . Colon polyps   . DEPRESSION 01/22/2009  . Heart valve replaced   . HYPERCHOLESTEROLEMIA 01/22/2009  . HYPERTENSION 01/22/2009   Dr. [name]  . 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 Morbid obesity ([place])   . Tremors of nervous system    ?ptsd  . Ulcer     Prior to Admission medications   Medication Sig Start Date End Date Taking? Authorizing Provider  acetaminophen (TYLENOL) 500 MG tablet Take 1,000 mg by mouth every 6 (six) hours as needed (pain).   Yes [provider]  cholecalciferol (VITAMIN D) 1000 units tablet Take 1,000 Units by mouth daily.    Yes [provider]  doxycycline (VIBRAMYCIN) 100 MG capsule Take 100 mg  by mouth 2 (two) times daily. 01/19/19  Yes [provider]  ELIQUIS 5 MG TABS tablet TAKE ONE TABLET BY MOUTH TWICE DAILY 11/28/17  Yes Dettinger, Fransisca Kaufmann, MD  fluticasone (FLONASE) 50 MCG/ACT nasal spray INSTILL TWO SPRAYS IN EACH NOSTRIL DAILY 12/13/18  Yes Hawks, Christy A, FNP  furosemide (LASIX) 20 MG tablet Take 1 tablet (20 mg total) by mouth daily. 04/17/17  Yes Timmothy Euler, MD  gabapentin (NEURONTIN) 300 MG capsule Take 1 capsule (300 mg total) by mouth 2 (two) times daily. 12/04/18  Yes Hawks, Christy A, FNP  glucose blood test strip 1 strip by Does not apply route 3 (three) times daily. Uses true metrix strips (diabetic club) 05/12/16  Yes [provider]  insulin detemir (LEVEMIR) 100 UNIT/ML injection Inject 0.47-0.48 mLs (47-48 Units total) into the skin 2 (two) times daily. Patient taking differently: Inject 42 Units into the skin 2 (two) times daily.  07/04/16  Yes Cherre Robins, PharmD  Insulin Pen Needle (B-D ULTRAFINE III SHORT PEN) 31G X 8 MM MISC Use to inject Victoza qd 11/20/15  Yes Timmothy Euler, MD  levothyroxine (SYNTHROID, LEVOTHROID) 200 MCG tablet Take 1 tablet (200 mcg total) by mouth daily before breakfast. 04/17/17  Yes Timmothy Euler, MD  lisinopril (ZESTRIL) 5 MG tablet Take 1 tablet (5 mg total) by mouth daily. 12/17/18  Yes Dettinger, Fransisca Kaufmann, MD  metFORMIN (GLUCOPHAGE) 1000 MG  tablet TAKE 1 TABLET BY MOUTH TWICE DAILY WITH A MEAL. 07/24/15  Yes Timmothy Euler, MD  Multiple Vitamins-Minerals (MENS MULTI VITAMIN & MINERAL PO) Take 1 tablet by mouth daily.     Yes [provider]  naproxen (NAPROSYN) 500 MG tablet Take 1 tablet (500 mg total) by mouth 2 (two) times daily with a meal. 12/10/18  Yes Noemi Chapel, MD  Omega-3 Fatty Acids (FISH OIL) 1200 MG CAPS Take 1,200-2,400 mg by mouth 2 (two) times daily. Take 1 capsule (1200 mg) by mouth daily at noon and 2 capsules (2400 mg) daily at bedtime   Yes [provider]  Semaglutide (OZEMPIC) 0.25 or 0.5 MG/DOSE SOPN Inject into the skin.   Yes [provider]  simvastatin (ZOCOR) 80 MG tablet Take 0.5 tablets (40 mg total) by mouth at bedtime. 04/04/17  Yes Timmothy Euler, MD  tamsulosin (FLOMAX) 0.4 MG CAPS capsule TAKE TWO CAPSULES BY MOUTH AT BEDTIME 12/12/18  Yes Dettinger, Fransisca Kaufmann, MD  traMADol (ULTRAM) 50 MG tablet Take 50 mg by mouth 2 (two) times daily. 08/09/18  Yes [provider]    Allergies  Allergen Reactions  . Phenothiazines Anaphylaxis  . Actos [Pioglitazone] Swelling    Social History   Tobacco Use  . Smoking status: Former Smoker    Packs/day: 0.00    Years: 12.00    Pack years: 0.00    Types: Cigarettes    Quit date: 09/20/1961    Years since quitting: 57.4  . Smokeless tobacco: Never Used  Substance Use Topics  . Alcohol use: Not Currently  . Drug use: No    Family History  Problem Relation Age of Onset  . Heart disease Father   . Congestive Heart Failure Father   . Arthritis Father   . Diabetes Other   . Benign prostatic hyperplasia Brother     Review of Systems  Constitutional: negative for fevers and chills Genitourinary: negative for dysuria and urinary incontinence All other systems reviewed and are negative    Constitutional: in no apparent distress  Vitals:   01/29/19 0948  BP: 98/61  Pulse: (!) 187   EYES: anicteric ENMT: + mask Cardiovascular: Cor RRR Respiratory: clear GI: Bowel sounds are normal, liver is not enlarged, spleen is not enlarged Musculoskeletal: no pedal edema noted Skin: negatives: no rash Neuro: non-focal  Labs: Lab Results  Component Value Date   WBC 8.4 01/08/2019   HGB 12.8 (L) 01/08/2019   HCT 38.1 (L) 01/08/2019   MCV 98.4 01/08/2019   PLT 124 (L) 01/08/2019    Lab Results  Component Value Date   CREATININE 1.74 (H) 01/08/2019   BUN 27 (H) 01/08/2019   NA 132 (L) 01/08/2019   K 5.5 (H) 01/08/2019   CL 99 01/08/2019   CO2 23 01/08/2019     Lab Results  Component Value Date   ALT 20 01/08/2019   AST 27 01/08/2019   ALKPHOS 57 01/08/2019   BILITOT 0.7 01/08/2019   INR 1.4 (H) 10/02/2018     Assessment: Multidrug resistant bacteruria, glucosuria, frequency of urination with positive urine cultures.  I discussed with him the symptoms.  I am not convinced that the mild symptoms are related to infection, particularly with no systemic symptoms, despite the positive urine cultures.  He has been on nearly continuous antibiotics and continues to have the same problem.  I most suspect the urinary symptoms are related to the glucosuria.  I discussed with him that continued  weight loss, improved sugar control likely will resolve the symptoms.  I also advised against taking antibiotics unless he develops fever or has leukocytosis.  Additionally, I discussed that he is likely colonized with bacteria and is at risk of infection but again, would avoid antibiotics unless there is a clear indication.  I discussed the mechanism of antibiotic resistance and if he does develop infection, he will need to be hospitalized for IV therapy.    Therefore, I recommend to avoid antibiotics unless there is a clear indication and if treatment indicated for cystitis that short 2-3 day durations is all that is indicated; prolonged 5, 7 or longer day durations greatly increase the chance of developing resistance further.  If treatment is required, he will likely need admission for IV therapy based on recent     Plan: 1) avoid antibiotics unless a clear indication 2) improved sugar control Follow up as needed

## 2019-02-01 ENCOUNTER — Telehealth: Payer: Self-pay | Admitting: *Deleted

## 2019-02-01 ENCOUNTER — Other Ambulatory Visit: Payer: Self-pay

## 2019-02-01 ENCOUNTER — Ambulatory Visit
Admission: EM | Admit: 2019-02-01 | Discharge: 2019-02-01 | Disposition: A | Discharge status: Home / Self Care | Payer: Medicare Other | Attending: Emergency Medicine | Admitting: Emergency Medicine

## 2019-02-01 DIAGNOSIS — R42 Dizziness and giddiness: Secondary | ICD-10-CM | POA: Insufficient documentation

## 2019-02-01 DIAGNOSIS — Z79899 Other long term (current) drug therapy: Secondary | ICD-10-CM | POA: Insufficient documentation

## 2019-02-01 DIAGNOSIS — R0602 Shortness of breath: Secondary | ICD-10-CM

## 2019-02-01 LAB — POCT URINALYSIS DIP (MANUAL ENTRY)
Bilirubin, UA: NEGATIVE
Glucose, UA: 250 mg/dL — AB
Ketones, POC UA: NEGATIVE mg/dL
Nitrite, UA: NEGATIVE
Protein Ur, POC: 30 mg/dL — AB
Spec Grav, UA: 1.015 (ref 1.010–1.025)
Urobilinogen, UA: 0.2 E.U./dL
pH, UA: 6 (ref 5.0–8.0)

## 2019-02-01 LAB — POCT FASTING CBG KUC MANUAL ENTRY: POCT Glucose (KUC): 187 mg/dL — AB (ref 70–99)

## 2019-02-01 MED ORDER — CEPHALEXIN 500 MG PO CAPS: 500.0000 mg | ORAL_CAPSULE | Freq: Two times a day (BID) | ORAL | 0 refills | Status: AC

## 2019-02-01 NOTE — ED Triage Notes (Signed)
Pt presents with complaints of right ear pain and dizziness when he stands up. Pt is also complaining of concern for UTI. Pt also states he is short of breath when he ambulates and that is not normal for him. States his symptoms have been going on for 2 weeks. Reports seeing 2 doctors with no answers.

## 2019-02-01 NOTE — ED Notes (Signed)
Pt

## 2019-02-01 NOTE — Telephone Encounter (Signed)
CareAlert received for A-flutter episode in progress since 01/31/19 at 18:50. V rate controlled. Per notes, patient required ANIPS to terminate previous episode at visit with Dr. Lovena Le on 01/08/19. Consider enabling atrial therapies at next OV.  Spoke with patient. He reports he went to [facility] in [place] earlier today due to dizziness, ear pain, and concern for UTI. Was prescribed cephalexin 500mg . Reports dizziness is mainly when he stands up.   Explained transmission findings. Pt denies chest discomfort, does note some SOB with exertion. Reports compliance with Eliquis, no missed doses. Reports main concern is dizziness with position changes. Encouraged adequate hydration. ED precautions reviewed for worsening symptoms over the weekend. Advised that I will schedule repeat PPM transmission for 02/04/19 to reassess rhythm. If still in A-flutter, will discuss options with MD. Pt in agreement with plan, no further questions at this time. Will forward to Dr. [name] and Dr. Lovena Le for review in the interim.

## 2019-02-01 NOTE — Discharge Instructions (Addendum)
Discussed EKG with Dr. Lanny Cramp, not concern for cardiac cause at this time Recommended blood work and orthostatics We will follow up with you regarding abnormal results Urine did show signs of infection Culture sent.  We will follow up with you regarding abnormal results.   Keflex prescribed.  Take as directed and to completion Follow up with PCP and/ or cardiology next week to ensure symptoms are improving Return or go to the ED if you have any new or worsening symptoms such as fever, chills nausea, vomiting, abdominal pain, chest pain, worsening shortness of breath, fainting, blood in stool, blood in urine, etc..Marland Kitchen

## 2019-02-01 NOTE — ED Provider Notes (Signed)
[place]   FE:[id number] 02/01/19 Arrival Time: 1  CC: DIZZINESS  SUBJECTIVE:  [name] TREVIZO is a 74 y.o. male hx significant for a fib (on xarelto), PBH, HV replacement, hypercholesterolemia, HTN, hypothyroid, morbid obesity, and tremors, who presents with complaint of dizziness that began 1 week ago.  Also reports SOB with exertion x couple of days.  Denies a precipitating event, trauma, or recent URI within the past month.  Describes the dizziness as "like he is going to pass out." Speculates his sugar may be low.  Blood glucose at home has been on average 120-130.  Has not had any readings lower than 120.  States that it is intermittent and worse with standing from a seated position.  Has tried sitting with relief.  Denies previous symptoms in the past.  Also complains of associated dysuria, fatigue, shaking.  Denies fever, chills, nausea, vomiting, chest pain, syncope, SOB, weakness, slurred speech, memory or emotional changes, facial drooping/ asymmetry, incoordination, numbness or tingling, abdominal pain, changes in bowel or bladder habits, polydipsia, polyuria, hematoche.    Saw cardiologist two days ago.  Brought to cardiologist attention that he was having dizziness and SOB.  Cardiologist attributed his symptoms to his low blood sugar, per patient.    Reports hx of UTIs in the past.    ROS: As per HPI.  All other pertinent ROS negative.    Past Medical History:  Diagnosis Date  . Arthritis   . Atrial fibrillation ([place])   . BPH (benign prostatic hypertrophy) 04/15/2010  . Cataract   . Colon polyps   . DEPRESSION 01/22/2009  . Heart valve replaced   . HYPERCHOLESTEROLEMIA 01/22/2009  . HYPERTENSION 01/22/2009   Dr. [name]  . 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 Morbid obesity ([place])   . Tremors of nervous system    ?ptsd  . Ulcer    Past Surgical History:  Procedure Laterality Date  . AORTIC VALVE REPLACEMENT  July 2006   #20  stentless [place] porcine valve  . APPENDECTOMY    . bilateral cataract surg    . COLONOSCOPY  04/24/2007   [name]: normal  . COLONOSCOPY WITH ESOPHAGOGASTRODUODENOSCOPY (EGD) N/A 04/05/2012   Procedure: COLONOSCOPY WITH ESOPHAGOGASTRODUODENOSCOPY (EGD);  Surgeon: Daneil Dolin, MD;  [facility];  Service: Endoscopy;  Laterality: N/A;  10;15  . DENTAL SURGERY  05/2004   Dental extractions  . EYE SURGERY Bilateral    cataract  . HEEL SPUR SURGERY Bilateral    resection of heel spur  . KNEE ARTHROSCOPY WITH LATERAL MENISECTOMY Right 04/04/2014   Procedure: KNEE ARTHROSCOPY WITH LATERAL MENISECTOMY;  Surgeon: Carole Civil, MD;  Location: [facility];  Service: Orthopedics;  Laterality: Right;  . PACEMAKER IMPLANT N/A 06/13/2016   Procedure: Pacemaker Implant- Dual Chamber;  Surgeon: Evans Lance, MD;  Location: [facility];  Service: Cardiovascular;  Laterality: N/A;  . PACEMAKER INSERTION  2018  . THYROIDECTOMY  03/21/2013   DR [name]  . THYROIDECTOMY N/A 03/21/2013   Procedure: THYROIDECTOMY;  Surgeon: [name], MD;  Location: [facility];  Service: General;  Laterality: N/A;  . TONSILLECTOMY     Allergies  Allergen Reactions  . Phenothiazines Anaphylaxis  . Actos [Pioglitazone] Swelling   No current facility-administered medications on file prior to encounter.   Current Outpatient Medications on File Prior to Encounter  Medication Sig Dispense Refill  . acetaminophen (TYLENOL) 500 MG tablet Take 1,000 mg by mouth every 6 (six)  hours as needed (pain).    . cholecalciferol (VITAMIN D) 1000 units tablet Take 1,000 Units by mouth daily.     Marland Kitchen ELIQUIS 5 MG TABS tablet TAKE ONE TABLET BY MOUTH TWICE DAILY 60 tablet 5  . fluticasone (FLONASE) 50 MCG/ACT nasal spray INSTILL TWO SPRAYS IN EACH NOSTRIL DAILY 16 g 6  . furosemide (LASIX) 20 MG tablet Take 1 tablet (20 mg total) by mouth daily. 30 tablet 1  . gabapentin (NEURONTIN) 300 MG capsule Take 1 capsule (300 mg  total) by mouth 2 (two) times daily. 180 capsule 3  . glucose blood test strip 1 strip by Does not apply route 3 (three) times daily. Uses true metrix strips (diabetic club)  12  . insulin detemir (LEVEMIR) 100 UNIT/ML injection Inject 0.47-0.48 mLs (47-48 Units total) into the skin 2 (two) times daily. (Patient taking differently: Inject 42 Units into the skin 2 (two) times daily. ) 10 mL   . Insulin Pen Needle (B-D ULTRAFINE III SHORT PEN) 31G X 8 MM MISC Use to inject Victoza qd 100 each 5  . levothyroxine (SYNTHROID, LEVOTHROID) 200 MCG tablet Take 1 tablet (200 mcg total) by mouth daily before breakfast. 30 tablet 1  . metFORMIN (GLUCOPHAGE) 1000 MG tablet TAKE 1 TABLET BY MOUTH TWICE DAILY WITH A MEAL. 180 tablet 0  . Multiple Vitamins-Minerals (MENS MULTI VITAMIN & MINERAL PO) Take 1 tablet by mouth daily.      . naproxen (NAPROSYN) 500 MG tablet Take 1 tablet (500 mg total) by mouth 2 (two) times daily with a meal. 30 tablet 0  . Omega-3 Fatty Acids (FISH OIL) 1200 MG CAPS Take 1,200-2,400 mg by mouth 2 (two) times daily. Take 1 capsule (1200 mg) by mouth daily at noon and 2 capsules (2400 mg) daily at bedtime    . Semaglutide (OZEMPIC) 0.25 or 0.5 MG/DOSE SOPN Inject into the skin.    Marland Kitchen simvastatin (ZOCOR) 80 MG tablet Take 0.5 tablets (40 mg total) by mouth at bedtime. 15 tablet 0  . tamsulosin (FLOMAX) 0.4 MG CAPS capsule TAKE TWO CAPSULES BY MOUTH AT BEDTIME 180 capsule 0  . traMADol (ULTRAM) 50 MG tablet Take 50 mg by mouth 2 (two) times daily.     Social History   Socioeconomic History  . Marital status: Single    Spouse name: Not on file  . Number of children: 1  . Years of education: HS  . Highest education level: Not on file  Occupational History  . Occupation: Administrator, retired    Fish farm manager: RETIRED  Tobacco Use  . Smoking status: Former Smoker    Packs/day: 0.00    Years: 12.00    Pack years: 0.00    Types: Cigarettes    Quit date: 09/20/1961    Years since  quitting: 57.4  . Smokeless tobacco: Never Used  Substance and Sexual Activity  . Alcohol use: Not Currently  . Drug use: No  . Sexual activity: Never    Comment: Has not smoked in 20 years  Other Topics Concern  . Not on file  Social History Narrative   Lives alone.   Quit smoking approximately 28 years ago.   Long haul truck driver, lives in [place].   Social Determinants of Health   Financial Resource Strain:   . Difficulty of Paying Living Expenses: Not on file  Food Insecurity:   . Worried About Charity fundraiser in the Last Year: Not on file  . Ran Out of Food  in the Last Year: Not on file  Transportation Needs:   . Lack of Transportation (Medical): Not on file  . Lack of Transportation (Non-Medical): Not on file  Physical Activity:   . Days of Exercise per Week: Not on file  . Minutes of Exercise per Session: Not on file  Stress:   . Feeling of Stress : Not on file  Social Connections:   . Frequency of Communication with Friends and Family: Not on file  . Frequency of Social Gatherings with Friends and Family: Not on file  . Attends Religious Services: Not on file  . Active Member of Clubs or Organizations: Not on file  . Attends Archivist Meetings: Not on file  . Marital Status: Not on file  Intimate Partner Violence:   . Fear of Current or Ex-Partner: Not on file  . Emotionally Abused: Not on file  . Physically Abused: Not on file  . Sexually Abused: Not on file   Family History  Problem Relation Age of Onset  . Heart disease Father   . Congestive Heart Failure Father   . Arthritis Father   . Diabetes Other   . Benign prostatic hyperplasia Brother     OBJECTIVE:  Vitals:   02/01/19 1034 02/01/19 1232 02/01/19 1235  BP: 106/64 107/63 (!) 84/50  Pulse: 79    Resp: 16    Temp: (!) 97.5 F (36.4 C)    TempSrc: Oral    SpO2: 98%       General appearance: alert; no distress Eyes: PERRLA; EOMI; conjunctiva normal HENT: normocephalic;  atraumatic; nasal mucosa normal; oral mucosa normal  Neck: supple with FROM Lungs: clear to auscultation bilaterally Heart: regular rate and rhythm Abdomen: soft, non-tender; bowel sounds normal Extremities: no cyanosis or edema; symmetrical with no gross deformities Skin: warm and dry Neurologic: normal gait; CN 2-12 grossly intact Psychological: alert and cooperative; normal mood and affect  Labs:  Results for orders placed or performed during the hospital encounter of 02/01/19 (from the past 24 hour(s))  POCT urinalysis dipstick     Status: Abnormal   Collection Time: 02/01/19 10:45 AM  Result Value Ref Range   Color, UA yellow yellow   Clarity, UA cloudy (A) clear   Glucose, UA =250 (A) negative mg/dL   Bilirubin, UA negative negative   Ketones, POC UA negative negative mg/dL   Spec Grav, UA 1.015 1.010 - 1.025   Blood, UA small (A) negative   pH, UA 6.0 5.0 - 8.0   Protein Ur, POC =30 (A) negative mg/dL   Urobilinogen, UA 0.2 0.2 or 1.0 E.U./dL   Nitrite, UA Negative Negative   Leukocytes, UA Large (3+) (A) Negative  POCT CBG (manual entry)     Status: Abnormal   Collection Time: 02/01/19 11:46 AM  Result Value Ref Range   POCT Glucose ([facility]) 187 (A) 70 - 99 mg/dL   Orders placed or performed during the hospital encounter of 02/01/19  . ED EKG  . ED EKG   EKG showed ventricular paced rhythm.  Comparable to EKG performed 2 days ago at cardiologist office.  Discussed EKG findings with Dr. Lanny Cramp.    ASSESSMENT & PLAN:  1. Shortness of breath   2. Dizziness and giddiness     Meds ordered this encounter  Medications  . cephALEXin (KEFLEX) 500 MG capsule    Sig: Take 1 capsule (500 mg total) by mouth 2 (two) times daily for 10 days.    Dispense:  20  capsule    Refill:  0    Order Specific Question:   Supervising Provider    Answer:   Raylene Everts [id number]   Discussed EKG with Dr. 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  We will follow up with you regarding abnormal results.   Keflex prescribed.  Take as directed and to completion Follow up with PCP and/ or cardiology next week to ensure symptoms are improving Return or go to the ED if you have any new or worsening symptoms such as fever, chills nausea, vomiting, abdominal pain, chest pain, worsening shortness of breath, fainting, blood in stool, blood in urine, etc...  Reviewed expectations re: course of current medical issues. Questions answered. Outlined signs and symptoms indicating need for more acute intervention. Patient verbalized understanding. After Visit Summary given.    Lestine Box, PA-C 02/01/19 1716

## 2019-02-02 LAB — CBC WITH DIFFERENTIAL/PLATELET
Basophils Absolute: 0.1 10*3/uL (ref 0.0–0.2)
Basos: 1 %
EOS (ABSOLUTE): 0.2 10*3/uL (ref 0.0–0.4)
Eos: 2 %
Hematocrit: 37.3 % — ABNORMAL LOW (ref 37.5–51.0)
Hemoglobin: 12.9 g/dL — ABNORMAL LOW (ref 13.0–17.7)
Immature Grans (Abs): 0 10*3/uL (ref 0.0–0.1)
Immature Granulocytes: 1 %
Lymphocytes Absolute: 2 10*3/uL (ref 0.7–3.1)
Lymphs: 22 %
MCH: 33.1 pg — ABNORMAL HIGH (ref 26.6–33.0)
MCHC: 34.6 g/dL (ref 31.5–35.7)
MCV: 96 fL (ref 79–97)
Monocytes Absolute: 0.8 10*3/uL (ref 0.1–0.9)
Monocytes: 9 %
Neutrophils Absolute: 5.8 10*3/uL (ref 1.4–7.0)
Neutrophils: 65 %
Platelets: 150 10*3/uL (ref 150–450)
RBC: 3.9 x10E6/uL — ABNORMAL LOW (ref 4.14–5.80)
RDW: 13.4 % (ref 11.6–15.4)
WBC: 8.8 10*3/uL (ref 3.4–10.8)

## 2019-02-02 LAB — URINE CULTURE

## 2019-02-02 LAB — BASIC METABOLIC PANEL
BUN/Creatinine Ratio: 16 (ref 10–24)
BUN: 19 mg/dL (ref 8–27)
CO2: 23 mmol/L (ref 20–29)
Calcium: 9.2 mg/dL (ref 8.6–10.2)
Chloride: 102 mmol/L (ref 96–106)
Creatinine, Ser: 1.21 mg/dL (ref 0.76–1.27)
GFR calc Af Amer: 67 mL/min/{1.73_m2} (ref >59)
GFR calc non Af Amer: 58 mL/min/{1.73_m2} — ABNORMAL LOW (ref >59)
Glucose: 157 mg/dL — ABNORMAL HIGH (ref 65–99)
Potassium: 4.7 mmol/L (ref 3.5–5.2)
Sodium: 137 mmol/L (ref 134–144)

## 2019-02-03 NOTE — Telephone Encounter (Signed)
Agree with the plan as below. GT

## 2019-02-04 ENCOUNTER — Ambulatory Visit (INDEPENDENT_AMBULATORY_CARE_PROVIDER_SITE_OTHER): Payer: Medicare Other | Admitting: *Deleted

## 2019-02-04 ENCOUNTER — Telehealth: Payer: Self-pay | Admitting: Family Medicine

## 2019-02-04 DIAGNOSIS — I4891 Unspecified atrial fibrillation: Secondary | ICD-10-CM

## 2019-02-04 LAB — CUP PACEART REMOTE DEVICE CHECK
Battery Remaining Longevity: 102 mo
Battery Voltage: 3 V
Brady Statistic AP VP Percent: 74.95 %
Brady Statistic AP VS Percent: 0 %
Brady Statistic AS VP Percent: 46.59 %
Brady Statistic AS VS Percent: 0 %
Brady Statistic RA Percent Paced: 1.42 %
Brady Statistic RV Percent Paced: 99.75 %
Date Time Interrogation Session: 20210111075313
Implantable Lead Implant Date: 20180521
Implantable Lead Implant Date: 20180521
Implantable Lead Location: 753859
Implantable Lead Location: 753859
Implantable Lead Model: 3830
Implantable Lead Model: 5076
Implantable Pulse Generator Implant Date: 20180521
Lead Channel Impedance Value: 285 Ohm
Lead Channel Impedance Value: 323 Ohm
Lead Channel Impedance Value: 361 Ohm
Lead Channel Impedance Value: 418 Ohm
Lead Channel Pacing Threshold Amplitude: 0.75 V
Lead Channel Pacing Threshold Amplitude: 0.875 V
Lead Channel Pacing Threshold Pulse Width: 0.4 ms
Lead Channel Pacing Threshold Pulse Width: 0.4 ms
Lead Channel Sensing Intrinsic Amplitude: 1 mV
Lead Channel Sensing Intrinsic Amplitude: 1 mV
Lead Channel Sensing Intrinsic Amplitude: 19.625 mV
Lead Channel Sensing Intrinsic Amplitude: 19.625 mV
Lead Channel Setting Pacing Amplitude: 1.5 V
Lead Channel Setting Pacing Amplitude: 2.5 V
Lead Channel Setting Pacing Pulse Width: 0.4 ms
Lead Channel Setting Sensing Sensitivity: 2 mV

## 2019-02-04 NOTE — Telephone Encounter (Signed)
Patient is calling back wanting to speak with a nurse in regards to previous conversation on 02/01/19.

## 2019-02-04 NOTE — Telephone Encounter (Signed)
Spoke with patient. AT/A-flutter episode ongoing since 01/31/19 at 18:50. V rate controlled. Pt reports he continues to feel weak, no new symptoms. Offered f/u with Dr. Lovena Le at [facility] office on 1/12, or in [place] on 1/13. Pt accepted appointment in [place] on 02/06/19 at 10:45am. Will likely attempt to pace-terminate AT at this appointment. Instructed pt to make sure he does not miss any doses of Eliquis in the interim. ED precautions reviewed. Pt verbalizes understanding of all instructions and denies additional questions or concerns at this time.

## 2019-02-04 NOTE — Telephone Encounter (Signed)
Busy signal

## 2019-02-06 ENCOUNTER — Encounter: Payer: Self-pay | Admitting: Internal Medicine

## 2019-02-06 ENCOUNTER — Ambulatory Visit (INDEPENDENT_AMBULATORY_CARE_PROVIDER_SITE_OTHER): Payer: Medicare Other | Admitting: Internal Medicine

## 2019-02-06 VITALS — BP 119/64 | HR 78 | Temp 96.8°F | Ht 73.0 in | Wt 256.0 lb

## 2019-02-06 DIAGNOSIS — I442 Atrioventricular block, complete: Secondary | ICD-10-CM

## 2019-02-06 DIAGNOSIS — I480 Paroxysmal atrial fibrillation: Secondary | ICD-10-CM | POA: Diagnosis not present

## 2019-02-06 LAB — CUP PACEART INCLINIC DEVICE CHECK
Battery Remaining Longevity: 104 mo
Battery Voltage: 3 V
Brady Statistic AP VP Percent: 1.29 %
Brady Statistic AP VS Percent: 0 %
Brady Statistic AS VP Percent: 98.51 %
Brady Statistic AS VS Percent: 0.08 %
Brady Statistic RA Percent Paced: 1.31 %
Brady Statistic RV Percent Paced: 99.87 %
Date Time Interrogation Session: 20210113123902
Implantable Lead Implant Date: 20180521
Implantable Lead Implant Date: 20180521
Implantable Lead Location: 753859
Implantable Lead Location: 753859
Implantable Lead Model: 3830
Implantable Lead Model: 5076
Implantable Pulse Generator Implant Date: 20180521
Lead Channel Impedance Value: 323 Ohm
Lead Channel Impedance Value: 342 Ohm
Lead Channel Impedance Value: 418 Ohm
Lead Channel Impedance Value: 437 Ohm
Lead Channel Pacing Threshold Amplitude: 1 V
Lead Channel Pacing Threshold Pulse Width: 0.4 ms
Lead Channel Sensing Intrinsic Amplitude: 1.625 mV
Lead Channel Setting Pacing Amplitude: 1.5 V
Lead Channel Setting Pacing Amplitude: 2.5 V
Lead Channel Setting Pacing Pulse Width: 0.4 ms
Lead Channel Setting Sensing Sensitivity: 2 mV

## 2019-02-06 NOTE — Patient Instructions (Signed)
Medication Instructions:  Your physician recommends that you continue on your current medications as directed. Please refer to the Current Medication list given to you today.  *If you need a refill on your cardiac medications before your next appointment, please call your pharmacy*  Lab Work: NONE  If you have labs (blood work) drawn today and your tests are completely normal, you will receive your results only by: . MyChart Message (if you have MyChart) OR . A paper copy in the mail If you have any lab test that is abnormal or we need to change your treatment, we will call you to review the results.  Testing/Procedures: NONE   Follow-Up: At [facility], you and your health needs are our priority.  As part of our continuing mission to provide you with exceptional heart care, we have created designated Provider Care Teams.  These Care Teams include your primary Cardiologist (physician) and Advanced Practice Providers (APPs -  Physician Assistants and Nurse Practitioners) who all work together to provide you with the care you need, when you need it.  Your next appointment:   1 year(s)  The format for your next appointment:   In Person  Provider:   Gregg Taylor, MD  Other Instructions Thank you for choosing [facility]!    

## 2019-02-06 NOTE — Progress Notes (Signed)
HPI [name] returns today for followup of his attrial arrhythmias, morbid obesity, and chronic systolic and diastolic CHF. He is s/p AVR in 2019. He has reverted back out of rhythm. When I saw him last we were able to pace him back to NSR.  He feels a little worse but overall not sure he can tell when he is out of rhythm. 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  Allergies  Allergen Reactions  . Phenothiazines Anaphylaxis  . Actos [Pioglitazone] Swelling     Current Outpatient Medications  Medication Sig Dispense Refill  . acetaminophen (TYLENOL) 500 MG tablet Take 1,000 mg by mouth every 6 (six) hours as needed (pain).    . cephALEXin (KEFLEX) 500 MG capsule Take 1 capsule (500 mg total) by mouth 2 (two) times daily for 10 days. 20 capsule 0  . cholecalciferol (VITAMIN D) 1000 units tablet Take 1,000 Units by mouth daily.     Marland Kitchen ELIQUIS 5 MG TABS tablet TAKE ONE TABLET BY MOUTH TWICE DAILY 60 tablet 5  . fluticasone (FLONASE) 50 MCG/ACT nasal spray INSTILL TWO SPRAYS IN EACH NOSTRIL DAILY 16 g 6  . furosemide (LASIX) 20 MG tablet Take 1 tablet (20 mg total) by mouth daily. 30 tablet 1  . gabapentin (NEURONTIN) 300 MG capsule Take 1 capsule (300 mg total) by mouth 2 (two) times daily. 180 capsule 3  . glucose blood test strip 1 strip by Does not apply route 3 (three) times daily. Uses true metrix strips (diabetic club)  12  . insulin detemir (LEVEMIR) 100 UNIT/ML injection Inject 0.47-0.48 mLs (47-48 Units total) into the skin 2 (two) times daily. (Patient taking differently: Inject 42 Units into the skin 2 (two) times daily. ) 10 mL   . Insulin Pen Needle (B-D ULTRAFINE III SHORT PEN) 31G X 8 MM MISC Use to inject Victoza qd 100 each 5  . levothyroxine (SYNTHROID, LEVOTHROID) 200 MCG tablet Take 1 tablet (200 mcg total) by mouth daily before breakfast. 30 tablet 1  . metFORMIN (GLUCOPHAGE) 1000 MG tablet TAKE 1 TABLET BY MOUTH TWICE DAILY WITH A MEAL. 180 tablet 0  . Multiple Vitamins-Minerals (MENS  MULTI VITAMIN & MINERAL PO) Take 1 tablet by mouth daily.      . naproxen (NAPROSYN) 500 MG tablet Take 1 tablet (500 mg total) by mouth 2 (two) times daily with a meal. 30 tablet 0  . Omega-3 Fatty Acids (FISH OIL) 1200 MG CAPS Take 1,200-2,400 mg by mouth 2 (two) times daily. Take 1 capsule (1200 mg) by mouth daily at noon and 2 capsules (2400 mg) daily at bedtime    . Semaglutide (OZEMPIC) 0.25 or 0.5 MG/DOSE SOPN Inject into the skin.    Marland Kitchen simvastatin (ZOCOR) 80 MG tablet Take 0.5 tablets (40 mg total) by mouth at bedtime. 15 tablet 0  . tamsulosin (FLOMAX) 0.4 MG CAPS capsule TAKE TWO CAPSULES BY MOUTH AT BEDTIME 180 capsule 0  . traMADol (ULTRAM) 50 MG tablet Take 50 mg by mouth 2 (two) times daily.     No current facility-administered medications for this visit.     Past Medical History:  Diagnosis Date  . Arthritis   . Atrial fibrillation ([place])   . BPH (benign prostatic hypertrophy) 04/15/2010  . Cataract   . Colon polyps   . DEPRESSION 01/22/2009  . Heart valve replaced   . HYPERCHOLESTEROLEMIA 01/22/2009  . HYPERTENSION 01/22/2009   Dr. [name]  . 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  Morbid obesity ([place])   . Tremors of nervous system    ?ptsd  . Ulcer     ROS:   All systems reviewed and negative except as noted in the HPI.   Past Surgical History:  Procedure Laterality Date  . 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 APPENDECTOMY    . bilateral cataract surg    . COLONOSCOPY  04/24/2007   [name]: normal  . COLONOSCOPY WITH ESOPHAGOGASTRODUODENOSCOPY (EGD) N/A 04/05/2012   Procedure: COLONOSCOPY WITH ESOPHAGOGASTRODUODENOSCOPY (EGD);  Surgeon: Daneil Dolin, MD;  [facility];  Service: Endoscopy;  Laterality: N/A;  10;15  . DENTAL SURGERY  05/2004   Dental extractions  . EYE SURGERY Bilateral    cataract  . HEEL SPUR SURGERY Bilateral    resection of heel spur  . KNEE ARTHROSCOPY  WITH LATERAL MENISECTOMY Right 04/04/2014   Procedure: KNEE ARTHROSCOPY WITH LATERAL MENISECTOMY;  Surgeon: Carole Civil, MD;  Location: [facility];  Service: Orthopedics;  Laterality: Right;  . PACEMAKER IMPLANT N/A 06/13/2016   Procedure: Pacemaker Implant- Dual Chamber;  Surgeon: Evans Lance, MD;  Location: [facility];  Service: Cardiovascular;  Laterality: N/A;  . PACEMAKER INSERTION  2018  . THYROIDECTOMY  03/21/2013   DR [name]  . THYROIDECTOMY N/A 03/21/2013   Procedure: THYROIDECTOMY;  Surgeon: [name], MD;  Location: [facility];  Service: General;  Laterality: N/A;  . TONSILLECTOMY       Family History  Problem Relation Age of Onset  . Heart disease Father   . Congestive Heart Failure Father   . Arthritis Father   . Diabetes Other   . Benign prostatic hyperplasia Brother      Social History   Socioeconomic History  . Marital status: Single    Spouse name: Not on file  . Number of children: 1  . Years of education: HS  . Highest education level: Not on file  Occupational History  . Occupation: Administrator, retired    Fish farm manager: RETIRED  Tobacco Use  . Smoking status: Former Smoker    Packs/day: 0.00    Years: 12.00    Pack years: 0.00    Types: Cigarettes    Quit date: 09/20/1961    Years since quitting: 57.4  . Smokeless tobacco: Never Used  Substance and Sexual Activity  . Alcohol use: Not Currently  . Drug use: No  . Sexual activity: Never    Comment: Has not smoked in 20 years  Other Topics Concern  . Not on file  Social History Narrative   Lives alone.   Quit smoking approximately 28 years ago.   Long haul truck driver, lives in [place].   Social Determinants of Health   Financial Resource Strain:   . Difficulty of Paying Living Expenses: Not on file  Food Insecurity:   . Worried About Charity fundraiser in the Last Year: Not on file  . Ran Out of Food in the Last Year: Not on file  Transportation Needs:   . Lack of  Transportation (Medical): Not on file  . Lack of Transportation (Non-Medical): Not on file  Physical Activity:   . Days of Exercise per Week: Not on file  . Minutes of Exercise per Session: Not on file  Stress:   . Feeling of Stress : Not on file  Social Connections:   . Frequency of Communication with Friends and Family: Not on file  .  Frequency of Social Gatherings with Friends and Family: Not on file  . Attends Religious Services: Not on file  . Active Member of Clubs or Organizations: Not on file  . Attends Archivist Meetings: Not on file  . Marital Status: Not on file  Intimate Partner Violence:   . Fear of Current or Ex-Partner: Not on file  . Emotionally Abused: Not on file  . Physically Abused: Not on file  . Sexually Abused: Not on file     BP 119/64   Pulse 78   Temp (!) 96.8 F (36 C)   Ht 6\' 1"  (1.854 m)   Wt 256 lb (116.1 kg)   SpO2 98%   BMI 33.78 kg/m   Physical Exam:  Well appearing NAD HEENT: Unremarkable Neck:  No JVD, no thyromegally Lymphatics:  No adenopathy Back:  No CVA tenderness Lungs:  Clear with no wheezes HEART:  Regular rate rhythm, no murmurs, no rubs, no clicks Abd:  soft, positive bowel sounds, no organomegally, no rebound, no guarding Ext:  2 plus pulses, no edema, no cyanosis, no clubbing Skin:  No rashes no nodules Neuro:  CN II through XII intact, motor grossly intact  DEVICE  Normal device function.  See PaceArt for details.   Assess/Plan: 1. Atrial flutter/fib - he could not be pace terminated today. I discussed initiation of amiodarone therapy with the patient and have reached out to Dr. Lenore Cordia. We will contact him if we decide on a trial of amio. 2. CHB - he is pacing all of the time.  3. Chronic systolic heart failure - he appears euvolemic on exam. He will maintain a low sodium diet. He will continue his current meds. 4. CAD - he denies anginal symptoms. He is encouraged to maintain a low sodium diet.  [name].D.

## 2019-02-09 NOTE — Progress Notes (Signed)
PPM Remote  

## 2019-02-12 ENCOUNTER — Telehealth: Payer: Self-pay | Admitting: Internal Medicine

## 2019-02-12 DIAGNOSIS — Z01818 Encounter for other preprocedural examination: Secondary | ICD-10-CM

## 2019-02-12 NOTE — Telephone Encounter (Signed)
Seen by cardiology on January 13.  No return call to our office. Note filed.

## 2019-02-12 NOTE — Telephone Encounter (Signed)
Attempt to reach x 2, line busy

## 2019-02-12 NOTE — Telephone Encounter (Signed)
Patient calling stating he has an irregular heart beat and yesterday he felt very weak. He would like a nurse to call him back for advice.

## 2019-02-13 MED ORDER — AMIODARONE HCL 200 MG PO TABS: 200.0000 mg | ORAL_TABLET | Freq: Two times a day (BID) | ORAL | 6 refills | Status: DC

## 2019-02-13 NOTE — Telephone Encounter (Signed)
Pt notified, order placed.

## 2019-02-13 NOTE — Telephone Encounter (Signed)
Per Dr. Lovena Le  "Amiodarone 200 mg BID and DCCV in 4 weeks

## 2019-02-18 ENCOUNTER — Telehealth: Payer: Self-pay | Admitting: Internal Medicine

## 2019-02-18 NOTE — Telephone Encounter (Signed)
  Patient c/o Palpitations:  High priority if patient c/o lightheadedness, shortness of breath, or chest pain   1) How long have you had palpitations/irregular HR/ Afib? Are you having the symptoms now? About a week   2) Are you currently experiencing lightheadedness, SOB or CP? No   3) Do you have a history of afib (atrial fibrillation) or irregular heart rhythm? No   4) Have you checked your BP or HR? (document readings if available): No  5) Are you experiencing any other symptoms? No  Patient is calling stating he is experiencing an irregular HR. He states it has been occurring for the past week. He does not experience any other symptoms with it but states he feels it may be due to the medication amiodarone (PACERONE) 200 MG tablet. Please advise.

## 2019-02-18 NOTE — Telephone Encounter (Signed)
Called pt. He states that he is feeling better. He felt as if his heart was racing a few times over the past few days. He was questioning when his DCCV will be. I informed him I will look into it. He appreciated the call back.

## 2019-02-21 NOTE — Telephone Encounter (Signed)
Called to notify pt that we will schedule DCCV at [facility]. Pt requested that I call him back today after 2 for time and date.

## 2019-02-22 ENCOUNTER — Encounter: Payer: Self-pay | Admitting: *Deleted

## 2019-02-22 NOTE — Telephone Encounter (Signed)
Nurse spoke with pt concerning DCCV.

## 2019-02-22 NOTE — Telephone Encounter (Signed)
Pt scheduled for 03/12/19 @ 1000 with Dr. Harrell Gave. Called to notify pt. No answer unable to leave voice mail.

## 2019-02-22 NOTE — Telephone Encounter (Signed)
Patient is calling back stating he was told he would get a callback yesterday 02/21/19 in regards to the questions he had about his DCCV being scheduled. Please advise.

## 2019-02-22 NOTE — Telephone Encounter (Signed)
Pt notified and voiced understanding 

## 2019-02-26 ENCOUNTER — Other Ambulatory Visit: Payer: Self-pay

## 2019-02-26 ENCOUNTER — Ambulatory Visit (INDEPENDENT_AMBULATORY_CARE_PROVIDER_SITE_OTHER): Payer: Medicare Other | Admitting: Family Medicine

## 2019-02-26 ENCOUNTER — Encounter: Payer: Self-pay | Admitting: Family Medicine

## 2019-02-26 VITALS — BP 146/71 | HR 72 | Temp 97.8°F | Ht 73.0 in | Wt 258.0 lb

## 2019-02-26 DIAGNOSIS — N390 Urinary tract infection, site not specified: Secondary | ICD-10-CM

## 2019-02-26 LAB — URINALYSIS, COMPLETE
Bilirubin, UA: NEGATIVE
Ketones, UA: NEGATIVE
Nitrite, UA: POSITIVE — AB
Protein,UA: NEGATIVE
Specific Gravity, UA: 1.015 (ref 1.005–1.030)
Urobilinogen, Ur: 0.2 mg/dL (ref 0.2–1.0)
pH, UA: 6 (ref 5.0–7.5)

## 2019-02-26 LAB — MICROSCOPIC EXAMINATION
Renal Epithel, UA: NONE SEEN /hpf
WBC, UA: >30 /hpf — AB (ref 0–5)

## 2019-02-26 MED ORDER — CEPHALEXIN 500 MG PO CAPS: 500.0000 mg | ORAL_CAPSULE | Freq: Two times a day (BID) | ORAL | 0 refills | Status: DC

## 2019-02-26 NOTE — Progress Notes (Signed)
Subjective: CC: UTI PCP: Dettinger, Fransisca Kaufmann, MD [id number] [name] is a 74 y.o. male presenting to clinic today for:  1. Urinary symptoms Patient reports a 1 week h/o dysuria .  Denies urinary frequency, urgency, hematuria, fevers, chills, abdominal pain, nausea, vomiting, back pain.  Patient has used nothing for symptoms.  Patient reports a h/o frequent or recurrent UTIs.    ROS: Per HPI  Allergies  Allergen Reactions  . Phenothiazines Anaphylaxis  . Actos [Pioglitazone] Swelling   Past Medical History:  Diagnosis Date  . Arthritis   . Atrial fibrillation ([place])   . BPH (benign prostatic hypertrophy) 04/15/2010  . Cataract   . Colon polyps   . DEPRESSION 01/22/2009  . Heart valve replaced   . HYPERCHOLESTEROLEMIA 01/22/2009  . HYPERTENSION 01/22/2009   Dr. [name]  . 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 Morbid obesity ([place])   . Tremors of nervous system    ?ptsd  . Ulcer     Current Outpatient Medications:  .  acetaminophen (TYLENOL) 500 MG tablet, Take 1,000 mg by mouth every 6 (six) hours as needed (pain)., Disp: , Rfl:  .  amiodarone (PACERONE) 200 MG tablet, Take 1 tablet (200 mg total) by mouth 2 (two) times daily., Disp: 60 tablet, Rfl: 6 .  cephALEXin (KEFLEX) 500 MG capsule, Take 1 capsule (500 mg total) by mouth 2 (two) times daily for 7 days., Disp: 14 capsule, Rfl: 0 .  cholecalciferol (VITAMIN D) 1000 units tablet, Take 1,000 Units by mouth daily. , Disp: , Rfl:  .  ELIQUIS 5 MG TABS tablet, TAKE ONE TABLET BY MOUTH TWICE DAILY, Disp: 60 tablet, Rfl: 5 .  fluticasone (FLONASE) 50 MCG/ACT nasal spray, INSTILL TWO SPRAYS IN EACH NOSTRIL DAILY, Disp: 16 g, Rfl: 6 .  furosemide (LASIX) 20 MG tablet, Take 1 tablet (20 mg total) by mouth daily., Disp: 30 tablet, Rfl: 1 .  gabapentin (NEURONTIN) 300 MG capsule, Take 1 capsule (300 mg total) by mouth 2 (two) times daily., Disp: 180 capsule, Rfl: 3 .  glucose blood test strip, 1 strip  by Does not apply route 3 (three) times daily. Uses true metrix strips (diabetic club), Disp: , Rfl: 12 .  insulin detemir (LEVEMIR) 100 UNIT/ML injection, Inject 0.47-0.48 mLs (47-48 Units total) into the skin 2 (two) times daily. (Patient taking differently: Inject 42 Units into the skin 2 (two) times daily. ), Disp: 10 mL, Rfl:  .  Insulin Pen Needle (B-D ULTRAFINE III SHORT PEN) 31G X 8 MM MISC, Use to inject Victoza qd, Disp: 100 each, Rfl: 5 .  levothyroxine (SYNTHROID, LEVOTHROID) 200 MCG tablet, Take 1 tablet (200 mcg total) by mouth daily before breakfast., Disp: 30 tablet, Rfl: 1 .  metFORMIN (GLUCOPHAGE) 1000 MG tablet, TAKE 1 TABLET BY MOUTH TWICE DAILY WITH A MEAL., Disp: 180 tablet, Rfl: 0 .  Multiple Vitamins-Minerals (MENS MULTI VITAMIN & MINERAL PO), Take 1 tablet by mouth daily.  , Disp: , Rfl:  .  naproxen (NAPROSYN) 500 MG tablet, Take 1 tablet (500 mg total) by mouth 2 (two) times daily with a meal., Disp: 30 tablet, Rfl: 0 .  Omega-3 Fatty Acids (FISH OIL) 1200 MG CAPS, Take 1,200-2,400 mg by mouth 2 (two) times daily. Take 1 capsule (1200 mg) by mouth daily at noon and 2 capsules (2400 mg) daily at bedtime, Disp: , Rfl:  .  Semaglutide (OZEMPIC) 0.25 or 0.5 MG/DOSE SOPN, Inject into the skin., Disp: , Rfl:  .  simvastatin (ZOCOR) 80 MG tablet, Take 0.5 tablets (40 mg total) by mouth at bedtime., Disp: 15 tablet, Rfl: 0 .  tamsulosin (FLOMAX) 0.4 MG CAPS capsule, TAKE TWO CAPSULES BY MOUTH AT BEDTIME, Disp: 180 capsule, Rfl: 0 .  traMADol (ULTRAM) 50 MG tablet, Take 50 mg by mouth 2 (two) times daily., Disp: , Rfl:  Social History   Socioeconomic History  . Marital status: Single    Spouse name: Not on file  . Number of children: 1  . Years of education: HS  . Highest education level: Not on file  Occupational History  . Occupation: Administrator, retired    Fish farm manager: RETIRED  Tobacco Use  . Smoking status: Former Smoker    Packs/day: 0.00    Years: 12.00    Pack  years: 0.00    Types: Cigarettes    Quit date: 09/20/1961    Years since quitting: 57.4  . Smokeless tobacco: Never Used  Substance and Sexual Activity  . Alcohol use: Not Currently  . Drug use: No  . Sexual activity: Never    Comment: Has not smoked in 20 years  Other Topics Concern  . Not on file  Social History Narrative   Lives alone.   Quit smoking approximately 28 years ago.   Long haul truck driver, lives in [place].   Social Determinants of Health   Financial Resource Strain:   . Difficulty of Paying Living Expenses: Not on file  Food Insecurity:   . Worried About Charity fundraiser in the Last Year: Not on file  . Ran Out of Food in the Last Year: Not on file  Transportation Needs:   . Lack of Transportation (Medical): Not on file  . Lack of Transportation (Non-Medical): Not on file  Physical Activity:   . Days of Exercise per Week: Not on file  . Minutes of Exercise per Session: Not on file  Stress:   . Feeling of Stress : Not on file  Social Connections:   . Frequency of Communication with Friends and Family: Not on file  . Frequency of Social Gatherings with Friends and Family: Not on file  . Attends Religious Services: Not on file  . Active Member of Clubs or Organizations: Not on file  . Attends Archivist Meetings: Not on file  . Marital Status: Not on file  Intimate Partner Violence:   . Fear of Current or Ex-Partner: Not on file  . Emotionally Abused: Not on file  . Physically Abused: Not on file  . Sexually Abused: Not on file   Family History  Problem Relation Age of Onset  . Heart disease Father   . Congestive Heart Failure Father   . Arthritis Father   . Diabetes Other   . Benign prostatic hyperplasia Brother     Objective: Office vital signs reviewed. BP (!) 146/71   Pulse 72   Temp 97.8 F (36.6 C) (Temporal)   Ht 6\' 1"  (1.854 m)   Wt 258 lb (117 kg)   SpO2 94%   BMI 34.04 kg/m   Physical Examination:  General:  Awake, alert, nontoxic, No acute distress GU: no suprapubic TTP. No CVA TTP  Assessment/ Plan: 74 y.o. male   1. Recurrent UTI Urinalysis with nitrite positive, leukocyte positive and trace blood.  Empiric antibiotic sent to the pharmacy.  Home care checks reviewed.  Push oral fluids.  Urine culture sent.  Follow-up with urologist as needed - Urinalysis, Complete - cephALEXin (KEFLEX) 500  MG capsule; Take 1 capsule (500 mg total) by mouth 2 (two) times daily for 7 days.  Dispense: 14 capsule; Refill: 0 - Urine Culture   Orders Placed This Encounter  Procedures  . Urine Culture  . Urinalysis, Complete   Meds ordered this encounter  Medications  . cephALEXin (KEFLEX) 500 MG capsule    Sig: Take 1 capsule (500 mg total) by mouth 2 (two) times daily for 7 days.    Dispense:  14 capsule    Refill:  Lamar, DO Pax [phone]

## 2019-02-26 NOTE — Patient Instructions (Signed)
Urinary Tract Infection, Adult A urinary tract infection (UTI) is an infection of any part of the urinary tract. The urinary tract includes:  The kidneys.  The ureters.  The bladder.  The urethra. These organs make, store, and get rid of pee (urine) in the body. What are the causes? This is caused by germs (bacteria) in your genital area. These germs grow and cause swelling (inflammation) of your urinary tract. What increases the risk? You are more likely to develop this condition if:  You have a small, thin tube (catheter) to drain pee.  You cannot control when you pee or poop (incontinence).  You are male, and: ? You use these methods to prevent pregnancy:  A medicine that kills sperm (spermicide).  A device that blocks sperm (diaphragm). ? You have low levels of a male hormone (estrogen). ? You are pregnant.  You have genes that add to your risk.  You are sexually active.  You take antibiotic medicines.  You have trouble peeing because of: ? A prostate that is bigger than normal, if you are male. ? A blockage in the part of your body that drains pee from the bladder (urethra). ? A kidney stone. ? A nerve condition that affects your bladder (neurogenic bladder). ? Not getting enough to drink. ? Not peeing often enough.  You have other conditions, such as: ? Diabetes. ? A weak disease-fighting system (immune system). ? Sickle cell disease. ? Gout. ? Injury of the spine. What are the signs or symptoms? Symptoms of this condition include:  Needing to pee right away (urgently).  Peeing often.  Peeing small amounts often.  Pain or burning when peeing.  Blood in the pee.  Pee that smells bad or not like normal.  Trouble peeing.  Pee that is cloudy.  Fluid coming from the vagina, if you are male.  Pain in the belly or lower back. Other symptoms include:  Throwing up (vomiting).  No urge to eat.  Feeling mixed up (confused).  Being tired  and grouchy (irritable).  A fever.  Watery poop (diarrhea). How is this treated? This condition may be treated with:  Antibiotic medicine.  Other medicines.  Drinking enough water. Follow these instructions at home:  Medicines  Take over-the-counter and prescription medicines only as told by your doctor.  If you were prescribed an antibiotic medicine, take it as told by your doctor. Do not stop taking it even if you start to feel better. General instructions  Make sure you: ? Pee until your bladder is empty. ? Do not hold pee for a long time. ? Empty your bladder after sex. ? Wipe from front to back after pooping if you are a male. Use each tissue one time when you wipe.  Drink enough fluid to keep your pee pale yellow.  Keep all follow-up visits as told by your doctor. This is important. Contact a doctor if:  You do not get better after 1-2 days.  Your symptoms go away and then come back. Get help right away if:  You have very bad back pain.  You have very bad pain in your lower belly.  You have a fever.  You are sick to your stomach (nauseous).  You are throwing up. Summary  A urinary tract infection (UTI) is an infection of any part of the urinary tract.  This condition is caused by germs in your genital area.  There are many risk factors for a UTI. These include having a small, thin   tube to drain pee and not being able to control when you pee or poop.  Treatment includes antibiotic medicines for germs.  Drink enough fluid to keep your pee pale yellow. This information is not intended to replace advice given to you by your health care provider. Make sure you discuss any questions you have with your health care provider. Document Revised: 12/28/2017 Document Reviewed: 07/20/2017 Elsevier Patient Education  2020 Elsevier Inc.  

## 2019-03-01 ENCOUNTER — Telehealth: Payer: Self-pay | Admitting: Family Medicine

## 2019-03-01 ENCOUNTER — Telehealth: Payer: Self-pay | Admitting: Internal Medicine

## 2019-03-01 NOTE — Telephone Encounter (Signed)
Pt calling in to report he has been feeling weak and is wondering if this is to be expected since he is in AFib. I advised that often times it can be normal. Pt states he has been checking his HR and it has been "normal" but he does not remember the number of his HR. He is also concerned that his pacemaker may not be working correctly. When asked "why" he says he is not sure how to know that it works. Will route to device clinic for advisement.

## 2019-03-01 NOTE — Telephone Encounter (Signed)
Patient is calling requesting a call from a nurse. Would not specify what about. Please advise.

## 2019-03-01 NOTE — Telephone Encounter (Signed)
Patient reports he has had a decreased energy level today. Reports he has not had any CP, Increased SOB, dizziness, or syncope. Unable to send manual transmission . Given [employer] tech support phone # to trouble shoot monitor. Scheduled for cardioversion on 03/12/19. ED precautions given and patient stated he understood.

## 2019-03-03 LAB — URINE CULTURE

## 2019-03-04 ENCOUNTER — Other Ambulatory Visit: Payer: Self-pay | Admitting: Family Medicine

## 2019-03-04 MED ORDER — SULFAMETHOXAZOLE-TRIMETHOPRIM 800-160 MG PO TABS: 1.0000 | ORAL_TABLET | Freq: Two times a day (BID) | ORAL | 0 refills | Status: AC

## 2019-03-08 ENCOUNTER — Other Ambulatory Visit: Payer: Self-pay

## 2019-03-08 ENCOUNTER — Other Ambulatory Visit (HOSPITAL_COMMUNITY)
Admission: RE | Admit: 2019-03-08 | Discharge: 2019-03-08 | Disposition: A | Discharge status: Home / Self Care | Payer: Medicare Other | Source: Ambulatory Visit | Attending: Internal Medicine | Admitting: Internal Medicine

## 2019-03-08 ENCOUNTER — Other Ambulatory Visit (HOSPITAL_COMMUNITY)
Admission: RE | Admit: 2019-03-08 | Discharge: 2019-03-08 | Disposition: A | Discharge status: Home / Self Care | Payer: Medicare Other | Source: Ambulatory Visit | Attending: Cardiology | Admitting: Cardiology

## 2019-03-08 DIAGNOSIS — Z20822 Contact with and (suspected) exposure to covid-19: Secondary | ICD-10-CM | POA: Insufficient documentation

## 2019-03-08 DIAGNOSIS — Z01812 Encounter for preprocedural laboratory examination: Secondary | ICD-10-CM | POA: Diagnosis not present

## 2019-03-08 LAB — BASIC METABOLIC PANEL
Anion gap: 7 (ref 5–15)
BUN: 20 mg/dL (ref 8–23)
CO2: 25 mmol/L (ref 22–32)
Calcium: 8.8 mg/dL — ABNORMAL LOW (ref 8.9–10.3)
Chloride: 99 mmol/L (ref 98–111)
Creatinine, Ser: 1.5 mg/dL — ABNORMAL HIGH (ref 0.61–1.24)
GFR calc Af Amer: 52 mL/min — ABNORMAL LOW (ref >60)
GFR calc non Af Amer: 45 mL/min — ABNORMAL LOW (ref >60)
Glucose, Bld: 223 mg/dL — ABNORMAL HIGH (ref 70–99)
Potassium: 5 mmol/L (ref 3.5–5.1)
Sodium: 131 mmol/L — ABNORMAL LOW (ref 135–145)

## 2019-03-08 LAB — CBC
HCT: 37 % — ABNORMAL LOW (ref 39.0–52.0)
Hemoglobin: 12 g/dL — ABNORMAL LOW (ref 13.0–17.0)
MCH: 32.7 pg (ref 26.0–34.0)
MCHC: 32.4 g/dL (ref 30.0–36.0)
MCV: 100.8 fL — ABNORMAL HIGH (ref 80.0–100.0)
Platelets: 111 10*3/uL — ABNORMAL LOW (ref 150–400)
RBC: 3.67 MIL/uL — ABNORMAL LOW (ref 4.22–5.81)
RDW: 14.1 % (ref 11.5–15.5)
WBC: 8 10*3/uL (ref 4.0–10.5)
nRBC: 0 % (ref 0.0–0.2)

## 2019-03-08 LAB — SARS CORONAVIRUS 2 (TAT 6-24 HRS): SARS Coronavirus 2: NEGATIVE

## 2019-03-11 ENCOUNTER — Telehealth: Payer: Self-pay | Admitting: Family Medicine

## 2019-03-11 NOTE — Telephone Encounter (Signed)
Returned call

## 2019-03-12 ENCOUNTER — Encounter (HOSPITAL_COMMUNITY)
Admission: RE | Disposition: A | Discharge status: Home / Self Care | Payer: Self-pay | Source: Home / Self Care | Attending: Cardiology

## 2019-03-12 ENCOUNTER — Ambulatory Visit (HOSPITAL_COMMUNITY)
Admission: RE | Admit: 2019-03-12 | Discharge: 2019-03-12 | Disposition: A | Discharge status: Home / Self Care | Payer: Medicare Other | Attending: Cardiology | Admitting: Cardiology

## 2019-03-12 ENCOUNTER — Other Ambulatory Visit: Payer: Self-pay

## 2019-03-12 ENCOUNTER — Telehealth: Payer: Self-pay | Admitting: Family Medicine

## 2019-03-12 ENCOUNTER — Telehealth: Payer: Self-pay | Admitting: Internal Medicine

## 2019-03-12 DIAGNOSIS — Z539 Procedure and treatment not carried out, unspecified reason: Secondary | ICD-10-CM | POA: Diagnosis not present

## 2019-03-12 DIAGNOSIS — I4891 Unspecified atrial fibrillation: Secondary | ICD-10-CM | POA: Diagnosis not present

## 2019-03-12 SURGERY — CANCELLED PROCEDURE

## 2019-03-12 NOTE — Telephone Encounter (Signed)
Patient calling stating that he went to the hospital today to get cardioverted, but they did not do anything because he was back in normal sinus rhythm. Patient stated he is still taking amiodarone and will continue. Patient wanted Dr. Lovena Le and his nurse to know, so will forward to them.

## 2019-03-12 NOTE — Progress Notes (Signed)
Pt showed up to cardioversion already in normal sinus rhythm. Confirmed by [employer]. Procedure cancelled today. Jobe Igo, RN

## 2019-03-12 NOTE — Telephone Encounter (Signed)
Patient reports he has already made an appointment with [facility] tomorrow.

## 2019-03-12 NOTE — Telephone Encounter (Signed)
New message   Patient is calling in to speak with Dr. Tanna Furry nurse. Wouldn't say what it was for, just states he needs to speak with Dr. Tanna Furry nurse. Please give patient a call back.

## 2019-03-13 ENCOUNTER — Other Ambulatory Visit: Payer: Self-pay

## 2019-03-13 ENCOUNTER — Ambulatory Visit (INDEPENDENT_AMBULATORY_CARE_PROVIDER_SITE_OTHER): Payer: Medicare Other | Admitting: Family Medicine

## 2019-03-13 ENCOUNTER — Encounter: Payer: Self-pay | Admitting: Family Medicine

## 2019-03-13 VITALS — BP 128/61 | HR 69 | Temp 96.8°F | Ht 72.0 in | Wt 256.6 lb

## 2019-03-13 DIAGNOSIS — E1142 Type 2 diabetes mellitus with diabetic polyneuropathy: Secondary | ICD-10-CM | POA: Diagnosis not present

## 2019-03-13 DIAGNOSIS — E890 Postprocedural hypothyroidism: Secondary | ICD-10-CM | POA: Diagnosis not present

## 2019-03-13 DIAGNOSIS — R5383 Other fatigue: Secondary | ICD-10-CM | POA: Diagnosis not present

## 2019-03-13 NOTE — Progress Notes (Signed)
Assessment & Plan:  1. Diabetic polyneuropathy associated with type 2 diabetes mellitus ([name]) - Well controlled on current regimen.   2-3. Postsurgical hypothyroidism/Fatigue, unspecified type - Abnormal TSH 3 months ago at which time PCP planned to repeat. Patient has not had f/u visit with PCP since that time, so we will repeat today.  - TSH   Follow up plan: Return PCP ASAP, for follow-up of chronic medication conditions.  Hendricks Limes, MSN, APRN, FNP-C [facility]  Subjective:   Patient ID: [name], male    DOB: [birth year], 74 y.o.   MRN: [id number]  HPI: [name] is a 74 y.o. male presenting on 03/13/2019 for Fatigue, Tingling (in feet), and Anxiety  Patient reports tinging in bilateral feet that has been ongoing. He has a diagnosis of neuropathy.  Patient has a prescription for gabapentin 600 mg twice daily which he reports he is taking and it helps very well.  Patient reports he sometimes feels nervous but not to the extent he needs medication.   Also says he is experiencing some fatigue.    ROS: Negative unless specifically indicated above in HPI.   Relevant past medical history reviewed and updated as indicated.   Allergies and medications reviewed and updated.   Current Outpatient Medications:  .  acetaminophen (TYLENOL) 500 MG tablet, Take 1,000 mg by mouth every 6 (six) hours as needed (pain)., Disp: , Rfl:  .  amiodarone (PACERONE) 200 MG tablet, Take 1 tablet (200 mg total) by mouth 2 (two) times daily., Disp: 60 tablet, Rfl: 6 .  cholecalciferol (VITAMIN D) 1000 units tablet, Take 1,000 Units by mouth daily. , Disp: , Rfl:  .  ELIQUIS 5 MG TABS tablet, TAKE ONE TABLET BY MOUTH TWICE DAILY (Patient taking differently: Take 5 mg by mouth 2 (two) times daily. ), Disp: 60 tablet, Rfl: 5 .  fluticasone (FLONASE) 50 MCG/ACT nasal spray, INSTILL TWO SPRAYS IN EACH NOSTRIL DAILY (Patient taking differently: Place 2 sprays into  both nostrils daily. ), Disp: 16 g, Rfl: 6 .  furosemide (LASIX) 20 MG tablet, Take 1 tablet (20 mg total) by mouth daily., Disp: 30 tablet, Rfl: 1 .  gabapentin (NEURONTIN) 300 MG capsule, Take 1 capsule (300 mg total) by mouth 2 (two) times daily., Disp: 180 capsule, Rfl: 3 .  glucose blood test strip, 1 strip by Does not apply route 3 (three) times daily. Uses true metrix strips (diabetic club), Disp: , Rfl: 12 .  insulin detemir (LEVEMIR) 100 UNIT/ML injection, Inject 0.47-0.48 mLs (47-48 Units total) into the skin 2 (two) times daily. (Patient taking differently: Inject 38-40 Units into the skin 2 (two) times daily. ), Disp: 10 mL, Rfl:  .  Insulin Pen Needle (B-D ULTRAFINE III SHORT PEN) 31G X 8 MM MISC, Use to inject Victoza qd, Disp: 100 each, Rfl: 5 .  levothyroxine (SYNTHROID, LEVOTHROID) 200 MCG tablet, Take 1 tablet (200 mcg total) by mouth daily before breakfast., Disp: 30 tablet, Rfl: 1 .  metFORMIN (GLUCOPHAGE) 1000 MG tablet, TAKE 1 TABLET BY MOUTH TWICE DAILY WITH A MEAL. (Patient taking differently: Take 1,000 mg by mouth in the morning and at bedtime. ), Disp: 180 tablet, Rfl: 0 .  Multiple Vitamins-Minerals (MENS MULTI VITAMIN & MINERAL PO), Take 1 tablet by mouth daily.  , Disp: , Rfl:  .  naproxen (NAPROSYN) 500 MG tablet, Take 1 tablet (500 mg total) by mouth 2 (two) times daily with a meal., Disp: 30 tablet, Rfl:  0 .  Omega-3 Fatty Acids (FISH OIL) 1200 MG CAPS, Take 1,200-2,400 mg by mouth 2 (two) times daily. Take 1 capsule (1200 mg) by mouth daily at noon and 2 capsules (2400 mg) daily at bedtime, Disp: , Rfl:  .  simvastatin (ZOCOR) 80 MG tablet, Take 0.5 tablets (40 mg total) by mouth at bedtime., Disp: 15 tablet, Rfl: 0 .  tamsulosin (FLOMAX) 0.4 MG CAPS capsule, TAKE TWO CAPSULES BY MOUTH AT BEDTIME (Patient taking differently: Take 0.8 mg by mouth at bedtime. ), Disp: 180 capsule, Rfl: 0 .  traMADol (ULTRAM) 50 MG tablet, Take 50 mg by mouth 2 (two) times daily., Disp: ,  Rfl:  .  lisinopril (ZESTRIL) 5 MG tablet, Take 5 mg by mouth daily., Disp: , Rfl:  .  Semaglutide,0.25 or 0.5MG /DOS, (OZEMPIC, 0.25 OR 0.5 MG/DOSE,) 2 MG/1.5ML SOPN, Inject 0.5 mg into the skin once a week. , Disp: , Rfl:   Allergies  Allergen Reactions  . Phenothiazines Anaphylaxis  . Actos [Pioglitazone] Swelling    Objective:   BP 128/61   Pulse 69   Temp (!) 96.8 F (36 C) (Temporal)   Ht 6' (1.829 m)   Wt 256 lb 9.6 oz (116.4 kg)   SpO2 97%   BMI 34.80 kg/m    Physical Exam Vitals reviewed.  Constitutional:      General: He is not in acute distress.    Appearance: Normal appearance. He is obese. He is not ill-appearing, toxic-appearing or diaphoretic.  HENT:     Head: Normocephalic and atraumatic.  Eyes:     General: No scleral icterus.       Right eye: No discharge.        Left eye: No discharge.     Conjunctiva/sclera: Conjunctivae normal.  Cardiovascular:     Rate and Rhythm: Normal rate and regular rhythm.     Heart sounds: Normal heart sounds. No murmur. No friction rub. No gallop.   Pulmonary:     Effort: Pulmonary effort is normal. No respiratory distress.     Breath sounds: Normal breath sounds. No stridor. No wheezing, rhonchi or rales.  Musculoskeletal:        General: Normal range of motion.     Cervical back: Normal range of motion.  Skin:    General: Skin is warm and dry.  Neurological:     Mental Status: He is alert and oriented to person, place, and time. Mental status is at baseline.     Motor: Tremor (LUE) present.  Psychiatric:        Mood and Affect: Mood normal.        Behavior: Behavior normal.        Thought Content: Thought content normal.        Judgment: Judgment normal.

## 2019-03-14 ENCOUNTER — Other Ambulatory Visit: Payer: Self-pay | Admitting: Family Medicine

## 2019-03-14 DIAGNOSIS — E890 Postprocedural hypothyroidism: Secondary | ICD-10-CM

## 2019-03-14 LAB — TSH: TSH: 0.152 u[IU]/mL — ABNORMAL LOW (ref 0.450–4.500)

## 2019-03-14 SURGERY — Surgical Case
Anesthesia: *Unknown

## 2019-03-14 MED ORDER — LEVOTHYROXINE SODIUM 175 MCG PO TABS: 175.0000 ug | ORAL_TABLET | Freq: Every day | ORAL | 2 refills | Status: DC

## 2019-03-19 ENCOUNTER — Telehealth: Payer: Self-pay | Admitting: Family Medicine

## 2019-03-19 ENCOUNTER — Telehealth: Payer: Self-pay | Admitting: Internal Medicine

## 2019-03-19 NOTE — Telephone Encounter (Signed)
Please see if he picked up the Septra that was sent in after his visit.  If so and he has completed this, please have him follow up with urology, Dr D or provider on call to repeat urinalysis.  Septra should have cleared infection.

## 2019-03-19 NOTE — Telephone Encounter (Signed)
Patient would like for Dr. Darnell Level to know that his UTI symptoms are still there.  He states that he did get some better but still having dysuria.  Requesting another abx sent to [facility].  Please advise and send to pools.

## 2019-03-19 NOTE — Telephone Encounter (Signed)
  Pt c/o medication issue:  1. Name of Medication: amiodarone (PACERONE) 200 MG tablet  2. How are you currently taking this medication (dosage and times per day)? Twice a day  3. Are you having a reaction (difficulty breathing--STAT)? no  4. What is your medication issue? Patient states he was going to have his heart shocked back into rhythm last week, but they need not need to. He would like to know if he should continue taking the medication.

## 2019-03-20 NOTE — Telephone Encounter (Signed)
Yes in 2-3 weeks. GT

## 2019-03-20 NOTE — Telephone Encounter (Signed)
Patient states he will call back and make an appointment

## 2019-03-20 NOTE — Telephone Encounter (Signed)
Per Dr. Lovena Le " F/u  2-3 wks , continue current dose of Amiodarone. "

## 2019-03-20 NOTE — Telephone Encounter (Signed)
Agree. GT 

## 2019-03-21 ENCOUNTER — Encounter: Payer: Self-pay | Admitting: Family Medicine

## 2019-03-21 ENCOUNTER — Ambulatory Visit (INDEPENDENT_AMBULATORY_CARE_PROVIDER_SITE_OTHER): Payer: Medicare Other | Admitting: Family Medicine

## 2019-03-21 DIAGNOSIS — R3989 Other symptoms and signs involving the genitourinary system: Secondary | ICD-10-CM | POA: Diagnosis not present

## 2019-03-21 NOTE — Progress Notes (Signed)
Virtual Visit via Telephone Note  I connected with [name] on 03/21/19 at 2:08 PM by telephone and verified that I am speaking with the correct person using two identifiers. [name] is currently located at home and nobody is currently with him during this visit. The provider, Loman Brooklyn, FNP is located in their office at time of visit.  I discussed the limitations, risks, security and privacy concerns of performing an evaluation and management service by telephone and the availability of in person appointments. I also discussed with the patient that there may be a patient responsible charge related to this service. The patient expressed understanding and agreed to proceed.  Subjective: PCP: Dettinger, Fransisca Kaufmann, MD  Chief Complaint  Patient presents with  . Urinary Tract Infection   Urinary Tract Infection: Patient complains of dysuria, frequency, nausea and urgency.  His symptoms started around 11 AM this morning.  Patient denies fever. Patient does have a history of recurrent UTI.  Patient is established with urology, Dr. Jeffie Pollock.  Patient was treated for a UTI earlier this month on 02/26/2019.  Initially he was prescribed Keflex.  Wound culture grew Enterobacter cloocae complex which was resistant to multiple antibiotics.  At that time he was changed to Septra as it was sensitive to the antibiotic.  Patient reports it did not help.   ROS: Per HPI  Current Outpatient Medications:  .  acetaminophen (TYLENOL) 500 MG tablet, Take 1,000 mg by mouth every 6 (six) hours as needed (pain)., Disp: , Rfl:  .  amiodarone (PACERONE) 200 MG tablet, Take 1 tablet (200 mg total) by mouth 2 (two) times daily., Disp: 60 tablet, Rfl: 6 .  cholecalciferol (VITAMIN D) 1000 units tablet, Take 1,000 Units by mouth daily. , Disp: , Rfl:  .  ELIQUIS 5 MG TABS tablet, TAKE ONE TABLET BY MOUTH TWICE DAILY (Patient taking differently: Take 5 mg by mouth 2 (two) times daily. ), Disp: 60 tablet,  Rfl: 5 .  fluticasone (FLONASE) 50 MCG/ACT nasal spray, INSTILL TWO SPRAYS IN EACH NOSTRIL DAILY (Patient taking differently: Place 2 sprays into both nostrils daily. ), Disp: 16 g, Rfl: 6 .  furosemide (LASIX) 20 MG tablet, Take 1 tablet (20 mg total) by mouth daily., Disp: 30 tablet, Rfl: 1 .  gabapentin (NEURONTIN) 300 MG capsule, Take 1 capsule (300 mg total) by mouth 2 (two) times daily., Disp: 180 capsule, Rfl: 3 .  glucose blood test strip, 1 strip by Does not apply route 3 (three) times daily. Uses true metrix strips (diabetic club), Disp: , Rfl: 12 .  insulin detemir (LEVEMIR) 100 UNIT/ML injection, Inject 0.47-0.48 mLs (47-48 Units total) into the skin 2 (two) times daily. (Patient taking differently: Inject 38-40 Units into the skin 2 (two) times daily. ), Disp: 10 mL, Rfl:  .  Insulin Pen Needle (B-D ULTRAFINE III SHORT PEN) 31G X 8 MM MISC, Use to inject Victoza qd, Disp: 100 each, Rfl: 5 .  levothyroxine (SYNTHROID) 175 MCG tablet, Take 1 tablet (175 mcg total) by mouth daily before breakfast., Disp: 30 tablet, Rfl: 2 .  lisinopril (ZESTRIL) 5 MG tablet, Take 5 mg by mouth daily., Disp: , Rfl:  .  metFORMIN (GLUCOPHAGE) 1000 MG tablet, TAKE 1 TABLET BY MOUTH TWICE DAILY WITH A MEAL. (Patient taking differently: Take 1,000 mg by mouth in the morning and at bedtime. ), Disp: 180 tablet, Rfl: 0 .  Multiple Vitamins-Minerals (MENS MULTI VITAMIN & MINERAL PO), Take 1 tablet by  mouth daily.  , Disp: , Rfl:  .  naproxen (NAPROSYN) 500 MG tablet, Take 1 tablet (500 mg total) by mouth 2 (two) times daily with a meal., Disp: 30 tablet, Rfl: 0 .  Omega-3 Fatty Acids (FISH OIL) 1200 MG CAPS, Take 1,200-2,400 mg by mouth 2 (two) times daily. Take 1 capsule (1200 mg) by mouth daily at noon and 2 capsules (2400 mg) daily at bedtime, Disp: , Rfl:  .  Semaglutide,0.25 or 0.5MG /DOS, (OZEMPIC, 0.25 OR 0.5 MG/DOSE,) 2 MG/1.5ML SOPN, Inject 0.5 mg into the skin once a week. , Disp: , Rfl:  .  simvastatin  (ZOCOR) 80 MG tablet, Take 0.5 tablets (40 mg total) by mouth at bedtime., Disp: 15 tablet, Rfl: 0 .  tamsulosin (FLOMAX) 0.4 MG CAPS capsule, TAKE TWO CAPSULES BY MOUTH AT BEDTIME (Patient taking differently: Take 0.8 mg by mouth at bedtime. ), Disp: 180 capsule, Rfl: 0 .  traMADol (ULTRAM) 50 MG tablet, Take 50 mg by mouth 2 (two) times daily., Disp: , Rfl:   Allergies  Allergen Reactions  . Phenothiazines Anaphylaxis  . Actos [Pioglitazone] Swelling   Past Medical History:  Diagnosis Date  . Arthritis   . Atrial fibrillation ([place])   . BPH (benign prostatic hypertrophy) 04/15/2010  . Cataract   . Colon polyps   . DEPRESSION 01/22/2009  . Heart valve replaced   . HYPERCHOLESTEROLEMIA 01/22/2009  . HYPERTENSION 01/22/2009   Dr. [name]  . 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 Morbid obesity ([place])   . Tremors of nervous system    ?ptsd  . Ulcer     Observations/Objective: A&O  No respiratory distress or wheezing audible over the phone Mood, judgement, and thought processes all WNL  Assessment and Plan: 1. Suspected UTI - Patient was unable to come into the office to give a urine specimen due to symptom of nausea in the setting of the COVID-19 pandemic.  I was looking back at his last urine culture which was resistant to multiple antibiotics.  I encouraged patient to call his urologist for further treatment options given the resistance of the bacteria in his urine.  Patient reports he will call urology tomorrow.  I offered assistance in calling today but patient declined.  Offered Zofran for nausea which patient also declined.   Follow Up Instructions:  I discussed the assessment and treatment plan with the patient. The patient was provided an opportunity to ask questions and all were answered. The patient agreed with the plan and demonstrated an understanding of the instructions.   The patient was advised to call back or seek an in-person evaluation  if the symptoms worsen or if the condition fails to improve as anticipated.  The above assessment and management plan was discussed with the patient. The patient verbalized understanding of and has agreed to the management plan. Patient is aware to call the clinic if symptoms persist or worsen. Patient is aware when to return to the clinic for a follow-up visit. Patient educated on when it is appropriate to go to the emergency department.   Time call ended: 2:23 PM  I provided 17 minutes of non-face-to-face time during this encounter.  Hendricks Limes, MSN, APRN, FNP-C [facility] 03/21/19

## 2019-03-26 ENCOUNTER — Other Ambulatory Visit: Payer: Self-pay

## 2019-03-26 ENCOUNTER — Ambulatory Visit (INDEPENDENT_AMBULATORY_CARE_PROVIDER_SITE_OTHER): Payer: Medicare Other | Admitting: Internal Medicine

## 2019-03-26 ENCOUNTER — Encounter: Payer: Self-pay | Admitting: Internal Medicine

## 2019-03-26 VITALS — BP 114/61 | HR 70 | Temp 98.8°F | Ht 73.0 in | Wt 259.0 lb

## 2019-03-26 DIAGNOSIS — I442 Atrioventricular block, complete: Secondary | ICD-10-CM

## 2019-03-26 DIAGNOSIS — I4891 Unspecified atrial fibrillation: Secondary | ICD-10-CM

## 2019-03-26 MED ORDER — AMIODARONE HCL 200 MG PO TABS: ORAL_TABLET | ORAL | 3 refills | Status: DC

## 2019-03-26 NOTE — Patient Instructions (Addendum)
Medication Instructions:  Your physician recommends that you continue on your current medications as directed. Please refer to the Current Medication list given to you today.  *If you need a refill on your cardiac medications before your next appointment, please call your pharmacy*  Take Amiodarone 200 mg Daily Monday -Friday and Take 200 mg Two Times Daily on Saturday and Sunday.   Lab Work: NONE   If you have labs (blood work) drawn today and your tests are completely normal, you will receive your results only by: Marland Kitchen MyChart Message (if you have MyChart) OR . A paper copy in the mail If you have any lab test that is abnormal or we need to change your treatment, we will call you to review the results.   Testing/Procedures: NONE     Follow-Up: At [facility], you and your health needs are our priority.  As part of our continuing mission to provide you with exceptional heart care, we have created designated Provider Care Teams.  These Care Teams include your primary Cardiologist (physician) and Advanced Practice Providers (APPs -  Physician Assistants and Nurse Practitioners) who all work together to provide you with the care you need, when you need it.  We recommend signing up for the patient portal called "MyChart".  Sign up information is provided on this After Visit Summary.  MyChart is used to connect with patients for Virtual Visits (Telemedicine).  Patients are able to view lab/test results, encounter notes, upcoming appointments, etc.  Non-urgent messages can be sent to your provider as well.   To learn more about what you can do with MyChart, go to [email or web address].    Your next appointment:   1 year(s)  The format for your next appointment:   In Person  Provider:   Cristopher Peru, MD   Other Instructions Thank you for choosing [facility]!

## 2019-03-26 NOTE — Progress Notes (Signed)
HPI [name]. He is a pleasant 74 yo man with a h/o persistent atrial fib, CHB, s/p PPM insertion and class 2 diastolic heart failure. He has done well since reverting back to NSR. His dyspnea is improved. No edema.  Allergies  Allergen Reactions  . Phenothiazines Anaphylaxis  . Actos [Pioglitazone] Swelling     Current Outpatient Medications  Medication Sig Dispense Refill  . acetaminophen (TYLENOL) 500 MG tablet Take 1,000 mg by mouth every 6 (six) hours as needed (pain).    Marland Kitchen amiodarone (PACERONE) 200 MG tablet Take 1 tablet (200 mg total) by mouth 2 (two) times daily. 60 tablet 6  . cholecalciferol (VITAMIN D) 1000 units tablet Take 1,000 Units by mouth daily.     Marland Kitchen ELIQUIS 5 MG TABS tablet TAKE ONE TABLET BY MOUTH TWICE DAILY (Patient taking differently: Take 5 mg by mouth 2 (two) times daily. ) 60 tablet 5  . fluticasone (FLONASE) 50 MCG/ACT nasal spray INSTILL TWO SPRAYS IN EACH NOSTRIL DAILY (Patient taking differently: Place 2 sprays into both nostrils daily. ) 16 g 6  . furosemide (LASIX) 20 MG tablet Take 1 tablet (20 mg total) by mouth daily. 30 tablet 1  . gabapentin (NEURONTIN) 300 MG capsule Take 1 capsule (300 mg total) by mouth 2 (two) times daily. 180 capsule 3  . glucose blood test strip 1 strip by Does not apply route 3 (three) times daily. Uses true metrix strips (diabetic club)  12  . insulin detemir (LEVEMIR) 100 UNIT/ML injection Inject 0.47-0.48 mLs (47-48 Units total) into the skin 2 (two) times daily. (Patient taking differently: Inject 38-40 Units into the skin 2 (two) times daily. ) 10 mL   . Insulin Pen Needle (B-D ULTRAFINE III SHORT PEN) 31G X 8 MM MISC Use to inject Victoza qd 100 each 5  . levothyroxine (SYNTHROID) 175 MCG tablet Take 1 tablet (175 mcg total) by mouth daily before breakfast. 30 tablet 2  . lisinopril (ZESTRIL) 5 MG tablet Take 5 mg by mouth daily.    . metFORMIN (GLUCOPHAGE) 1000 MG tablet TAKE 1 TABLET  BY MOUTH TWICE DAILY WITH A MEAL. (Patient taking differently: Take 1,000 mg by mouth in the morning and at bedtime. ) 180 tablet 0  . Multiple Vitamins-Minerals (MENS MULTI VITAMIN & MINERAL PO) Take 1 tablet by mouth daily.      . naproxen (NAPROSYN) 500 MG tablet Take 1 tablet (500 mg total) by mouth 2 (two) times daily with a meal. 30 tablet 0  . Omega-3 Fatty Acids (FISH OIL) 1200 MG CAPS Take 1,200-2,400 mg by mouth 2 (two) times daily. Take 1 capsule (1200 mg) by mouth daily at noon and 2 capsules (2400 mg) daily at bedtime    . Semaglutide,0.25 or 0.5MG /DOS, (OZEMPIC, 0.25 OR 0.5 MG/DOSE,) 2 MG/1.5ML SOPN Inject 0.5 mg into the skin once a week.     . simvastatin (ZOCOR) 80 MG tablet Take 0.5 tablets (40 mg total) by mouth at bedtime. 15 tablet 0  . tamsulosin (FLOMAX) 0.4 MG CAPS capsule TAKE TWO CAPSULES BY MOUTH AT BEDTIME (Patient taking differently: Take 0.8 mg by mouth at bedtime. ) 180 capsule 0  . traMADol (ULTRAM) 50 MG tablet Take 50 mg by mouth 2 (two) times daily.     No current facility-administered medications for this visit.     Past Medical History:  Diagnosis Date  . Arthritis   . Atrial fibrillation ([place])   .  BPH (benign prostatic hypertrophy) 04/15/2010  . Cataract   . Colon polyps   . DEPRESSION 01/22/2009  . Heart valve replaced   . HYPERCHOLESTEROLEMIA 01/22/2009  . HYPERTENSION 01/22/2009   Dr. [name]  . 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 Morbid obesity ([place])   . Tremors of nervous system    ?ptsd  . Ulcer     ROS:   All systems reviewed and negative except as noted in the HPI.   Past Surgical History:  Procedure Laterality Date  . 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 APPENDECTOMY    . bilateral cataract surg    . COLONOSCOPY  04/24/2007   [name]: normal  . COLONOSCOPY WITH ESOPHAGOGASTRODUODENOSCOPY (EGD) N/A 04/05/2012   Procedure: COLONOSCOPY WITH  ESOPHAGOGASTRODUODENOSCOPY (EGD);  Surgeon: Daneil Dolin, MD;  Location: [facility];  Service: Endoscopy;  Laterality: N/A;  10;15  . DENTAL SURGERY  05/2004   Dental extractions  . EYE SURGERY Bilateral    cataract  . HEEL SPUR SURGERY Bilateral    resection of heel spur  . KNEE ARTHROSCOPY WITH LATERAL MENISECTOMY Right 04/04/2014   Procedure: KNEE ARTHROSCOPY WITH LATERAL MENISECTOMY;  Surgeon: Carole Civil, MD;  Location: [facility] ORS;  Service: Orthopedics;  Laterality: Right;  . PACEMAKER IMPLANT N/A 06/13/2016   Procedure: Pacemaker Implant- Dual Chamber;  Surgeon: Evans Lance, MD;  Location: [facility];  Service: Cardiovascular;  Laterality: N/A;  . PACEMAKER INSERTION  2018  . THYROIDECTOMY  03/21/2013   DR [name]  . THYROIDECTOMY N/A 03/21/2013   Procedure: THYROIDECTOMY;  Surgeon: [name], MD;  Location: [facility];  Service: General;  Laterality: N/A;  . TONSILLECTOMY       Family History  Problem Relation Age of Onset  . Heart disease Father   . Congestive Heart Failure Father   . Arthritis Father   . Diabetes Other   . Benign prostatic hyperplasia Brother      Social History   Socioeconomic History  . Marital status: Single    Spouse name: Not on file  . Number of children: 1  . Years of education: HS  . Highest education level: Not on file  Occupational History  . Occupation: Administrator, retired    Fish farm manager: RETIRED  Tobacco Use  . Smoking status: Former Smoker    Packs/day: 0.00    Years: 12.00    Pack years: 0.00    Types: Cigarettes    Quit date: 09/20/1961    Years since quitting: 57.5  . Smokeless tobacco: Never Used  Substance and Sexual Activity  . Alcohol use: Not Currently  . Drug use: No  . Sexual activity: Never    Comment: Has not smoked in 20 years  Other Topics Concern  . Not on file  Social History Narrative   Lives alone.   Quit smoking approximately 28 years ago.   Long haul truck driver, lives in [place].    Social Determinants of Health   Financial Resource Strain:   . Difficulty of Paying Living Expenses: Not on file  Food Insecurity:   . Worried About Charity fundraiser in the Last Year: Not on file  . Ran Out of Food in the Last Year: Not on file  Transportation Needs:   . Lack of Transportation (Medical): Not on file  . Lack of Transportation (Non-Medical): Not on file  Physical Activity:   . Days  of Exercise per Week: Not on file  . Minutes of Exercise per Session: Not on file  Stress:   . Feeling of Stress : Not on file  Social Connections:   . Frequency of Communication with Friends and Family: Not on file  . Frequency of Social Gatherings with Friends and Family: Not on file  . Attends Religious Services: Not on file  . Active Member of Clubs or Organizations: Not on file  . Attends Archivist Meetings: Not on file  . Marital Status: Not on file  Intimate Partner Violence:   . Fear of Current or Ex-Partner: Not on file  . Emotionally Abused: Not on file  . Physically Abused: Not on file  . Sexually Abused: Not on file     BP 114/61   Pulse 70   Temp 98.8 F (37.1 C)   Ht 6\' 1"  (1.854 m)   Wt 259 lb (117.5 kg)   SpO2 97%   BMI 34.17 kg/m   Physical Exam:  Well appearing NAD HEENT: Unremarkable Neck:  No JVD, no thyromegally Lymphatics:  No adenopathy Back:  No CVA tenderness Lungs:  Clear with no wheezes HEART:  Regular rate rhythm, no murmurs, no rubs, no clicks Abd:  soft, positive bowel sounds, no organomegally, no rebound, no guarding Ext:  2 plus pulses, no edema, no cyanosis, no clubbing Skin:  No rashes no nodules Neuro:  CN II through XII intact, motor grossly intact  DEVICE  Normal device function.  See PaceArt for details.   Assess/Plan: 1. Persistent atrial fib - he has reverted back to NSR on amiodarone. He will reduce his dose from 400 daily to 200 mg daily, Mon-Fri, and 200 bid on Sat/Sun. I would anticipate additonal  reductions when I see him in 6 months. 2.  CAD - he denies anginal symptoms.  3. Chronic systolic heart failure - he appears to be euvolemic exam. He is encouraged to maintain a low sodium diet. His dyspnea is improved on NSR.  4. PPM. - his medtronic DDD PM is working normally. He has underlying CHB.

## 2019-03-29 ENCOUNTER — Ambulatory Visit (INDEPENDENT_AMBULATORY_CARE_PROVIDER_SITE_OTHER): Payer: Medicare Other | Admitting: Family Medicine

## 2019-03-29 DIAGNOSIS — M1990 Unspecified osteoarthritis, unspecified site: Secondary | ICD-10-CM

## 2019-03-29 NOTE — Progress Notes (Signed)
Virtual Visit via Telephone Note  I connected with [name] on 03/29/19 at 8:39 AM by telephone and verified that I am speaking with the correct person using two identifiers. [name] is currently located at home and nobody is currently with him during this visit. The provider, Loman Brooklyn, FNP is located in their office at time of visit.  I discussed the limitations, risks, security and privacy concerns of performing an evaluation and management service by telephone and the availability of in person appointments. I also discussed with the patient that there may be a patient responsible charge related to this service. The patient expressed understanding and agreed to proceed.  Subjective: PCP: Dettinger, Fransisca Kaufmann, MD  Chief Complaint  Patient presents with  . Arthritis   Patient reports his arthritis is acting up.  He does occasionally take extra strength Tylenol but reports this is only when he gets so bad that he can hardly walk.   ROS: Per HPI  Current Outpatient Medications:  .  acetaminophen (TYLENOL) 500 MG tablet, Take 1,000 mg by mouth every 6 (six) hours as needed (pain)., Disp: , Rfl:  .  amiodarone (PACERONE) 200 MG tablet, Take 1 Tablet Daily Monday -Friday and Take 1 Tablet Two Times Daily on Saturday and Sunday, Disp: 90 tablet, Rfl: 3 .  cholecalciferol (VITAMIN D) 1000 units tablet, Take 1,000 Units by mouth daily. , Disp: , Rfl:  .  ELIQUIS 5 MG TABS tablet, TAKE ONE TABLET BY MOUTH TWICE DAILY (Patient taking differently: Take 5 mg by mouth 2 (two) times daily. ), Disp: 60 tablet, Rfl: 5 .  fluticasone (FLONASE) 50 MCG/ACT nasal spray, INSTILL TWO SPRAYS IN EACH NOSTRIL DAILY (Patient taking differently: Place 2 sprays into both nostrils daily. ), Disp: 16 g, Rfl: 6 .  furosemide (LASIX) 20 MG tablet, Take 1 tablet (20 mg total) by mouth daily., Disp: 30 tablet, Rfl: 1 .  gabapentin (NEURONTIN) 300 MG capsule, Take 1 capsule (300 mg total) by mouth 2  (two) times daily., Disp: 180 capsule, Rfl: 3 .  glucose blood test strip, 1 strip by Does not apply route 3 (three) times daily. Uses true metrix strips (diabetic club), Disp: , Rfl: 12 .  insulin detemir (LEVEMIR) 100 UNIT/ML injection, Inject 0.47-0.48 mLs (47-48 Units total) into the skin 2 (two) times daily. (Patient taking differently: Inject 38-40 Units into the skin 2 (two) times daily. ), Disp: 10 mL, Rfl:  .  Insulin Pen Needle (B-D ULTRAFINE III SHORT PEN) 31G X 8 MM MISC, Use to inject Victoza qd, Disp: 100 each, Rfl: 5 .  levothyroxine (SYNTHROID) 175 MCG tablet, Take 1 tablet (175 mcg total) by mouth daily before breakfast., Disp: 30 tablet, Rfl: 2 .  lisinopril (ZESTRIL) 5 MG tablet, Take 5 mg by mouth daily., Disp: , Rfl:  .  metFORMIN (GLUCOPHAGE) 1000 MG tablet, TAKE 1 TABLET BY MOUTH TWICE DAILY WITH A MEAL. (Patient taking differently: Take 1,000 mg by mouth in the morning and at bedtime. ), Disp: 180 tablet, Rfl: 0 .  Multiple Vitamins-Minerals (MENS MULTI VITAMIN & MINERAL PO), Take 1 tablet by mouth daily.  , Disp: , Rfl:  .  naproxen (NAPROSYN) 500 MG tablet, Take 1 tablet (500 mg total) by mouth 2 (two) times daily with a meal., Disp: 30 tablet, Rfl: 0 .  Omega-3 Fatty Acids (FISH OIL) 1200 MG CAPS, Take 1,200-2,400 mg by mouth 2 (two) times daily. Take 1 capsule (1200 mg) by mouth  daily at noon and 2 capsules (2400 mg) daily at bedtime, Disp: , Rfl:  .  Semaglutide,0.25 or 0.5MG /DOS, (OZEMPIC, 0.25 OR 0.5 MG/DOSE,) 2 MG/1.5ML SOPN, Inject 0.5 mg into the skin once a week. , Disp: , Rfl:  .  simvastatin (ZOCOR) 80 MG tablet, Take 0.5 tablets (40 mg total) by mouth at bedtime., Disp: 15 tablet, Rfl: 0 .  tamsulosin (FLOMAX) 0.4 MG CAPS capsule, TAKE TWO CAPSULES BY MOUTH AT BEDTIME (Patient taking differently: Take 0.8 mg by mouth at bedtime. ), Disp: 180 capsule, Rfl: 0 .  traMADol (ULTRAM) 50 MG tablet, Take 50 mg by mouth 2 (two) times daily., Disp: , Rfl:   Allergies   Allergen Reactions  . Phenothiazines Anaphylaxis  . Actos [Pioglitazone] Swelling   Past Medical History:  Diagnosis Date  . Arthritis   . Atrial fibrillation ([place])   . BPH (benign prostatic hypertrophy) 04/15/2010  . Cataract   . Colon polyps   . DEPRESSION 01/22/2009  . Heart valve replaced   . HYPERCHOLESTEROLEMIA 01/22/2009  . HYPERTENSION 01/22/2009   Dr. [name]  . 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 Morbid obesity ([place])   . Tremors of nervous system    ?ptsd  . Ulcer     Observations/Objective: A&O  No respiratory distress or wheezing audible over the phone Mood, judgement, and thought processes all WNL  Assessment and Plan: 1. Arthritis - Advised patient to take Tylenol more frequently for arthritis pain.  Advised he can take up to 3,000 mg/day.   Follow Up Instructions:  I discussed the assessment and treatment plan with the patient. The patient was provided an opportunity to ask questions and all were answered. The patient agreed with the plan and demonstrated an understanding of the instructions.   The patient was advised to call back or seek an in-person evaluation if the symptoms worsen or if the condition fails to improve as anticipated.  The above assessment and management plan was discussed with the patient. The patient verbalized understanding of and has agreed to the management plan. Patient is aware to call the clinic if symptoms persist or worsen. Patient is aware when to return to the clinic for a follow-up visit. Patient educated on when it is appropriate to go to the emergency department.   Time call ended: 8:45 AM  I provided 7 minutes of non-face-to-face time during this encounter.  Hendricks Limes, MSN, APRN, FNP-C [facility] 03/29/19

## 2019-03-31 ENCOUNTER — Encounter: Payer: Self-pay | Admitting: Family Medicine

## 2019-04-03 DIAGNOSIS — N3000 Acute cystitis without hematuria: Secondary | ICD-10-CM | POA: Diagnosis not present

## 2019-04-03 DIAGNOSIS — N3020 Other chronic cystitis without hematuria: Secondary | ICD-10-CM | POA: Diagnosis not present

## 2019-04-03 DIAGNOSIS — N471 Phimosis: Secondary | ICD-10-CM | POA: Diagnosis not present

## 2019-04-05 ENCOUNTER — Encounter: Payer: Self-pay | Admitting: Family Medicine

## 2019-04-05 ENCOUNTER — Ambulatory Visit (INDEPENDENT_AMBULATORY_CARE_PROVIDER_SITE_OTHER): Payer: Medicare Other | Admitting: Family Medicine

## 2019-04-05 ENCOUNTER — Ambulatory Visit (INDEPENDENT_AMBULATORY_CARE_PROVIDER_SITE_OTHER): Payer: Medicare Other | Admitting: *Deleted

## 2019-04-05 DIAGNOSIS — R29898 Other symptoms and signs involving the musculoskeletal system: Secondary | ICD-10-CM | POA: Diagnosis not present

## 2019-04-05 DIAGNOSIS — I4891 Unspecified atrial fibrillation: Secondary | ICD-10-CM | POA: Diagnosis not present

## 2019-04-05 LAB — CUP PACEART REMOTE DEVICE CHECK
Battery Remaining Longevity: 101 mo
Battery Voltage: 3 V
Brady Statistic AP VP Percent: 1.11 %
Brady Statistic AP VS Percent: 0 %
Brady Statistic AS VP Percent: 98.87 %
Brady Statistic AS VS Percent: 0.01 %
Brady Statistic RA Percent Paced: 1.11 %
Brady Statistic RV Percent Paced: 99.99 %
Date Time Interrogation Session: 20210311192037
Implantable Lead Implant Date: 20180521
Implantable Lead Implant Date: 20180521
Implantable Lead Location: 753859
Implantable Lead Location: 753859
Implantable Lead Model: 3830
Implantable Lead Model: 5076
Implantable Pulse Generator Implant Date: 20180521
Lead Channel Impedance Value: 304 Ohm
Lead Channel Impedance Value: 323 Ohm
Lead Channel Impedance Value: 399 Ohm
Lead Channel Impedance Value: 418 Ohm
Lead Channel Pacing Threshold Amplitude: 0.875 V
Lead Channel Pacing Threshold Amplitude: 1 V
Lead Channel Pacing Threshold Pulse Width: 0.4 ms
Lead Channel Pacing Threshold Pulse Width: 0.4 ms
Lead Channel Sensing Intrinsic Amplitude: 0.5 mV
Lead Channel Sensing Intrinsic Amplitude: 0.5 mV
Lead Channel Sensing Intrinsic Amplitude: 19.625 mV
Lead Channel Sensing Intrinsic Amplitude: 19.625 mV
Lead Channel Setting Pacing Amplitude: 1.75 V
Lead Channel Setting Pacing Amplitude: 2.5 V
Lead Channel Setting Pacing Pulse Width: 0.4 ms
Lead Channel Setting Sensing Sensitivity: 2 mV

## 2019-04-05 NOTE — Progress Notes (Signed)
Virtual Visit via telephone Note  I connected with [name] on 04/05/19 at 1044 by telephone and verified that I am speaking with the correct person using two identifiers. [name] is currently located at home and no other people are currently with her during visit. The provider, Fransisca Kaufmann Shaneeka Scarboro, MD is located in their office at time of visit.  Call ended at 1055  I discussed the limitations, risks, security and privacy concerns of performing an evaluation and management service by telephone and the availability of in person appointments. I also discussed with the patient that there may be a patient responsible charge related to this service. The patient expressed understanding and agreed to proceed.   History and Present Illness: Patient is calling for a balance issues.  This happens every now an then. He feels like he shaking more than he normally does.  He feels off balance when he is shaking. He is having this over 1.5 months.  He feels like he is week in his legs sometimes. He denies any fevers or chills.   1. Muscular deconditioning   2. Weakness of both lower extremities     Outpatient Encounter Medications as of 04/05/2019  Medication Sig  . acetaminophen (TYLENOL) 500 MG tablet Take 1,000 mg by mouth every 6 (six) hours as needed (pain).  Marland Kitchen amiodarone (PACERONE) 200 MG tablet Take 1 Tablet Daily Monday -Friday and Take 1 Tablet Two Times Daily on Saturday and Sunday  . cholecalciferol (VITAMIN D) 1000 units tablet Take 1,000 Units by mouth daily.   Marland Kitchen ELIQUIS 5 MG TABS tablet TAKE ONE TABLET BY MOUTH TWICE DAILY (Patient taking differently: Take 5 mg by mouth 2 (two) times daily. )  . fluticasone (FLONASE) 50 MCG/ACT nasal spray INSTILL TWO SPRAYS IN EACH NOSTRIL DAILY (Patient taking differently: Place 2 sprays into both nostrils daily. )  . furosemide (LASIX) 20 MG tablet Take 1 tablet (20 mg total) by mouth daily.  Marland Kitchen gabapentin (NEURONTIN) 300 MG capsule Take  1 capsule (300 mg total) by mouth 2 (two) times daily.  Marland Kitchen glucose blood test strip 1 strip by Does not apply route 3 (three) times daily. Uses true metrix strips (diabetic club)  . insulin detemir (LEVEMIR) 100 UNIT/ML injection Inject 0.47-0.48 mLs (47-48 Units total) into the skin 2 (two) times daily. (Patient taking differently: Inject 38-40 Units into the skin 2 (two) times daily. )  . Insulin Pen Needle (B-D ULTRAFINE III SHORT PEN) 31G X 8 MM MISC Use to inject Victoza qd  . levothyroxine (SYNTHROID) 175 MCG tablet Take 1 tablet (175 mcg total) by mouth daily before breakfast.  . lisinopril (ZESTRIL) 5 MG tablet Take 5 mg by mouth daily.  . metFORMIN (GLUCOPHAGE) 1000 MG tablet TAKE 1 TABLET BY MOUTH TWICE DAILY WITH A MEAL. (Patient taking differently: Take 1,000 mg by mouth in the morning and at bedtime. )  . Multiple Vitamins-Minerals (MENS MULTI VITAMIN & MINERAL PO) Take 1 tablet by mouth daily.    . Omega-3 Fatty Acids (FISH OIL) 1200 MG CAPS Take 1,200-2,400 mg by mouth 2 (two) times daily. Take 1 capsule (1200 mg) by mouth daily at noon and 2 capsules (2400 mg) daily at bedtime  . Semaglutide,0.25 or 0.5MG /DOS, (OZEMPIC, 0.25 OR 0.5 MG/DOSE,) 2 MG/1.5ML SOPN Inject 0.5 mg into the skin once a week.   . simvastatin (ZOCOR) 80 MG tablet Take 0.5 tablets (40 mg total) by mouth at bedtime.  . tamsulosin (FLOMAX) 0.4 MG  CAPS capsule TAKE TWO CAPSULES BY MOUTH AT BEDTIME (Patient taking differently: Take 0.8 mg by mouth at bedtime. )  . traMADol (ULTRAM) 50 MG tablet Take 50 mg by mouth 2 (two) times daily.   No facility-administered encounter medications on file as of 04/05/2019.    Review of Systems  Constitutional: Negative for chills and fever.  Eyes: Negative for visual disturbance.  Respiratory: Negative for shortness of breath and wheezing.   Cardiovascular: Negative for chest pain and leg swelling.  Musculoskeletal: Positive for gait problem. Negative for back pain.  Skin:  Negative for rash.  Neurological: Positive for weakness. Negative for dizziness, light-headedness and numbness.  All other systems reviewed and are negative.   Observations/Objective: Patient sounds comfortable and in no acute distress  Assessment and Plan: Problem List Items Addressed This Visit    None    Visit Diagnoses    Muscular deconditioning    -  Primary   Weakness of both lower extremities          Recommended pt and will hold off because of covid, recommended that he do stretches at home, if anything worsens would probably need to go to physical therapy, likely deconditioning because of inactivity from Covid. Follow up plan: Return if symptoms worsen or fail to improve.     I discussed the assessment and treatment plan with the patient. The patient was provided an opportunity to ask questions and all were answered. The patient agreed with the plan and demonstrated an understanding of the instructions.   The patient was advised to call back or seek an in-person evaluation if the symptoms worsen or if the condition fails to improve as anticipated.  The above assessment and management plan was discussed with the patient. The patient verbalized understanding of and has agreed to the management plan. Patient is aware to call the clinic if symptoms persist or worsen. Patient is aware when to return to the clinic for a follow-up visit. Patient educated on when it is appropriate to go to the emergency department.    I provided 11 minutes of non-face-to-face time during this encounter.    [name], MD

## 2019-04-05 NOTE — Progress Notes (Signed)
PPM Remote  

## 2019-04-08 ENCOUNTER — Telehealth: Payer: Self-pay | Admitting: Internal Medicine

## 2019-04-08 NOTE — Telephone Encounter (Signed)
Spoke with pt advised transmission received, normal device function.  Next transmission due 07/05/19

## 2019-04-08 NOTE — Telephone Encounter (Signed)
New message   Patient wants to know if his transmission was received from yesterday? Please advise.

## 2019-04-16 ENCOUNTER — Ambulatory Visit (INDEPENDENT_AMBULATORY_CARE_PROVIDER_SITE_OTHER): Payer: Medicare Other | Admitting: Family Medicine

## 2019-04-16 DIAGNOSIS — J3489 Other specified disorders of nose and nasal sinuses: Secondary | ICD-10-CM | POA: Diagnosis not present

## 2019-04-16 DIAGNOSIS — T8089XA Other complications following infusion, transfusion and therapeutic injection, initial encounter: Secondary | ICD-10-CM | POA: Diagnosis not present

## 2019-04-16 DIAGNOSIS — R52 Pain, unspecified: Secondary | ICD-10-CM

## 2019-04-16 MED ORDER — AZELASTINE HCL 0.1 % NA SOLN: 1.0000 | Freq: Two times a day (BID) | NASAL | 12 refills | Status: DC

## 2019-04-16 NOTE — Progress Notes (Signed)
Telephone visit  Subjective: CC: Diabetes question PCP: Dettinger, Fransisca Kaufmann, MD [id number] [name] is a 74 y.o. male calls for telephone consult today. Patient provides verbal consent for consult held via phone.  Due to COVID-19 pandemic this visit was conducted virtually. This visit type was conducted due to national recommendations for restrictions regarding the COVID-19 Pandemic (e.g. social distancing, sheltering in place) in an effort to limit this patient'[name] exposure and mitigate transmission in our community. All issues noted in this document were discussed and addressed.  A physical exam was not performed with this format.   Location of patient: Home Location of provider: [facility] Others present for call: None  Patient is a has been having pain at his injection sites.  He denies any significant bleeding or any leaking of his insulin.  He typically moves around his belly as his main area of injection.  He is never used his thighs or arms.  He also notes that he is been having rhinorrhea that is unrelieved by Flonase.  He is wanting to know if there are any other things that he can use.  Does not report any coughing, sneezing, watery eyes or nasal congestion.   ROS: Per HPI  Allergies  Allergen Reactions  . Phenothiazines Anaphylaxis  . Actos [Pioglitazone] Swelling   Past Medical History:  Diagnosis Date  . Arthritis   . Atrial fibrillation ([place])   . BPH (benign prostatic hypertrophy) 04/15/2010  . Cataract   . Colon polyps   . DEPRESSION 01/22/2009  . Heart valve replaced   . HYPERCHOLESTEROLEMIA 01/22/2009  . HYPERTENSION 01/22/2009   Dr. [name]  . 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 Morbid obesity ([place])   . Tremors of nervous system    ?ptsd  . Ulcer     Current Outpatient Medications:  .  acetaminophen (TYLENOL) 500 MG tablet, Take 1,000 mg by mouth every 6 (six) hours as needed (pain)., Disp: , Rfl:  .  amiodarone (PACERONE) 200 MG  tablet, Take 1 Tablet Daily Monday -Friday and Take 1 Tablet Two Times Daily on Saturday and Sunday, Disp: 90 tablet, Rfl: 3 .  cholecalciferol (VITAMIN D) 1000 units tablet, Take 1,000 Units by mouth daily. , Disp: , Rfl:  .  ELIQUIS 5 MG TABS tablet, TAKE ONE TABLET BY MOUTH TWICE DAILY (Patient taking differently: Take 5 mg by mouth 2 (two) times daily. ), Disp: 60 tablet, Rfl: 5 .  fluticasone (FLONASE) 50 MCG/ACT nasal spray, INSTILL TWO SPRAYS IN EACH NOSTRIL DAILY (Patient taking differently: Place 2 sprays into both nostrils daily. ), Disp: 16 g, Rfl: 6 .  furosemide (LASIX) 20 MG tablet, Take 1 tablet (20 mg total) by mouth daily., Disp: 30 tablet, Rfl: 1 .  gabapentin (NEURONTIN) 300 MG capsule, Take 1 capsule (300 mg total) by mouth 2 (two) times daily., Disp: 180 capsule, Rfl: 3 .  glucose blood test strip, 1 strip by Does not apply route 3 (three) times daily. Uses true metrix strips (diabetic club), Disp: , Rfl: 12 .  insulin detemir (LEVEMIR) 100 UNIT/ML injection, Inject 0.47-0.48 mLs (47-48 Units total) into the skin 2 (two) times daily. (Patient taking differently: Inject 38-40 Units into the skin 2 (two) times daily. ), Disp: 10 mL, Rfl:  .  Insulin Pen Needle (B-D ULTRAFINE III SHORT PEN) 31G X 8 MM MISC, Use to inject Victoza qd, Disp: 100 each, Rfl: 5 .  levothyroxine (SYNTHROID) 175 MCG tablet, Take 1 tablet (175 mcg total) by  mouth daily before breakfast., Disp: 30 tablet, Rfl: 2 .  lisinopril (ZESTRIL) 5 MG tablet, Take 5 mg by mouth daily., Disp: , Rfl:  .  metFORMIN (GLUCOPHAGE) 1000 MG tablet, TAKE 1 TABLET BY MOUTH TWICE DAILY WITH A MEAL. (Patient taking differently: Take 1,000 mg by mouth in the morning and at bedtime. ), Disp: 180 tablet, Rfl: 0 .  Multiple Vitamins-Minerals (MENS MULTI VITAMIN & MINERAL PO), Take 1 tablet by mouth daily.  , Disp: , Rfl:  .  Omega-3 Fatty Acids (FISH OIL) 1200 MG CAPS, Take 1,200-2,400 mg by mouth 2 (two) times daily. Take 1 capsule (1200  mg) by mouth daily at noon and 2 capsules (2400 mg) daily at bedtime, Disp: , Rfl:  .  Semaglutide,0.25 or 0.5MG /DOS, (OZEMPIC, 0.25 OR 0.5 MG/DOSE,) 2 MG/1.5ML SOPN, Inject 0.5 mg into the skin once a week. , Disp: , Rfl:  .  simvastatin (ZOCOR) 80 MG tablet, Take 0.5 tablets (40 mg total) by mouth at bedtime., Disp: 15 tablet, Rfl: 0 .  tamsulosin (FLOMAX) 0.4 MG CAPS capsule, TAKE TWO CAPSULES BY MOUTH AT BEDTIME (Patient taking differently: Take 0.8 mg by mouth at bedtime. ), Disp: 180 capsule, Rfl: 0 .  traMADol (ULTRAM) 50 MG tablet, Take 50 mg by mouth 2 (two) times daily., Disp: , Rfl:   Assessment/ Plan: 74 y.o. male   1. Rhinorrhea Astelin nasal spray prescribed.  Instructions for use discussed with patient.  He will follow up as needed on this issue  2. Pain at injection site, initial encounter Recommended changing his injection site to the thighs.  He likely has scar tissue that is causing pain at the injection site.  He will follow-up as needed   Start time: 8:19am End time: 8:23a  Total time spent on patient care (including telephone call/ virtual visit): 12 minutes  [place], [place] [phone]

## 2019-04-23 ENCOUNTER — Telehealth: Payer: Self-pay | Admitting: Family Medicine

## 2019-04-23 NOTE — Telephone Encounter (Signed)
Pt had questions regarding Lupus for someone he knows that has Lupus. Advised pt to have his friend to contact his/her healthcare provider with any questions or concerns. Pt voiced understanding.

## 2019-04-24 ENCOUNTER — Other Ambulatory Visit: Payer: Self-pay | Admitting: Family Medicine

## 2019-04-30 ENCOUNTER — Other Ambulatory Visit: Payer: Self-pay

## 2019-04-30 ENCOUNTER — Ambulatory Visit (INDEPENDENT_AMBULATORY_CARE_PROVIDER_SITE_OTHER): Payer: Medicare Other

## 2019-04-30 ENCOUNTER — Encounter: Payer: Self-pay | Admitting: Family Medicine

## 2019-04-30 ENCOUNTER — Ambulatory Visit (INDEPENDENT_AMBULATORY_CARE_PROVIDER_SITE_OTHER): Payer: Medicare Other | Admitting: Family Medicine

## 2019-04-30 VITALS — BP 140/70 | HR 90 | Temp 98.6°F | Ht 73.0 in | Wt 264.0 lb

## 2019-04-30 DIAGNOSIS — R0600 Dyspnea, unspecified: Secondary | ICD-10-CM

## 2019-04-30 DIAGNOSIS — R0609 Other forms of dyspnea: Secondary | ICD-10-CM | POA: Diagnosis not present

## 2019-04-30 DIAGNOSIS — R21 Rash and other nonspecific skin eruption: Secondary | ICD-10-CM

## 2019-04-30 MED ORDER — TRIAMCINOLONE ACETONIDE 0.1 % EX CREA: 1.0000 "application " | TOPICAL_CREAM | Freq: Two times a day (BID) | CUTANEOUS | 0 refills | Status: DC

## 2019-04-30 NOTE — Patient Instructions (Signed)
Contact Dermatitis Dermatitis is redness, soreness, and swelling (inflammation) of the skin. Contact dermatitis is a reaction to certain substances that touch the skin. Many different substances can cause contact dermatitis. There are two types of contact dermatitis:  Irritant contact dermatitis. This type is caused by something that irritates your skin, such as having dry hands from washing them too often with soap. This type does not require previous exposure to the substance for a reaction to occur. This is the most common type.  Allergic contact dermatitis. This type is caused by a substance that you are allergic to, such as poison ivy. This type occurs when you have been exposed to the substance (allergen) and develop a sensitivity to it. Dermatitis may develop soon after your first exposure to the allergen, or it may not develop until the next time you are exposed and every time thereafter. What are the causes? Irritant contact dermatitis is most commonly caused by exposure to:  Makeup.  Soaps.  Detergents.  Bleaches.  Acids.  Metal salts, such as nickel. Allergic contact dermatitis is most commonly caused by exposure to:  Poisonous plants.  Chemicals.  Jewelry.  Latex.  Medicines.  Preservatives in products, such as clothing. What increases the risk? You are more likely to develop this condition if you have:  A job that exposes you to irritants or allergens.  Certain medical conditions, such as asthma or eczema. What are the signs or symptoms? Symptoms of this condition may occur on your body anywhere the irritant has touched you or is touched by you.  Symptoms include: ? Dryness or flaking. ? Redness. ? Cracks. ? Itching. ? Pain or a burning feeling. ? Blisters. ? Drainage of small amounts of blood or clear fluid from skin cracks. With allergic contact dermatitis, there may also be swelling in areas such as the eyelids, mouth, or genitals. How is this  diagnosed? This condition is diagnosed with a medical history and physical exam.  A patch skin test may be performed to help determine the cause.  If the condition is related to your job, you may need to see an occupational medicine specialist. How is this treated? This condition is treated by checking for the cause of the reaction and protecting your skin from further contact. Treatment may also include:  Steroid creams or ointments. Oral steroid medicines may be needed in more severe cases.  Antibiotic medicines or antibacterial ointments, if a skin infection is present.  Antihistamine lotion or an antihistamine taken by mouth to ease itching.  A bandage (dressing). Follow these instructions at home: Skin care  Moisturize your skin as needed.  Apply cool compresses to the affected areas.  Try applying baking soda paste to your skin. Stir water into baking soda until it reaches a paste-like consistency.  Do not scratch your skin, and avoid friction to the affected area.  Avoid the use of soaps, perfumes, and dyes. Medicines  Take or apply over-the-counter and prescription medicines only as told by your health care provider.  If you were prescribed an antibiotic medicine, take or apply the antibiotic as told by your health care provider. Do not stop using the antibiotic even if your condition improves. Bathing  Try taking a bath with: ? Epsom salts. Follow the instructions on the packaging. You can get these at your local pharmacy or grocery store. ? Baking soda. Pour a small amount into the bath as directed by your health care provider. ? Colloidal oatmeal. Follow the instructions on the   packaging. You can get this at your local pharmacy or grocery store.  Bathe less frequently, such as every other day.  Bathe in lukewarm water. Avoid using hot water. Bandage care  If you were given a bandage (dressing), change it as told by your health care provider.  Wash your hands  with soap and water before and after you change your dressing. If soap and water are not available, use hand sanitizer. General instructions  Avoid the substance that caused your reaction. If you do not know what caused it, keep a journal to try to track what caused it. Write down: ? What you eat. ? What cosmetic products you use. ? What you drink. ? What you wear in the affected area. This includes jewelry.  Check the affected areas every day for signs of infection. Check for: ? More redness, swelling, or pain. ? More fluid or blood. ? Warmth. ? Pus or a bad smell.  Keep all follow-up visits as told by your health care provider. This is important. Contact a health care provider if:  Your condition does not improve with treatment.  Your condition gets worse.  You have signs of infection such as swelling, tenderness, redness, soreness, or warmth in the affected area.  You have a fever.  You have new symptoms. Get help right away if:  You have a severe headache, neck pain, or neck stiffness.  You vomit.  You feel very sleepy.  You notice red streaks coming from the affected area.  Your bone or joint underneath the affected area becomes painful after the skin has healed.  The affected area turns darker.  You have difficulty breathing. Summary  Dermatitis is redness, soreness, and swelling (inflammation) of the skin. Contact dermatitis is a reaction to certain substances that touch the skin.  Symptoms of this condition may occur on your body anywhere the irritant has touched you or is touched by you.  This condition is treated by figuring out what caused the reaction and protecting your skin from further contact. Treatment may also include medicines and skin care.  Avoid the substance that caused your reaction. If you do not know what caused it, keep a journal to try to track what caused it.  Contact a health care provider if your condition gets worse or you have signs  of infection such as swelling, tenderness, redness, soreness, or warmth in the affected area. This information is not intended to replace advice given to you by your health care provider. Make sure you discuss any questions you have with your health care provider. Document Revised: 05/02/2018 Document Reviewed: 07/26/2017 [employer] Patient Education  2020 [employer]  

## 2019-04-30 NOTE — Progress Notes (Signed)
Subjective: CC: Rash PCP: Dettinger, Fransisca Kaufmann, MD [id number] [name] is a 74 y.o. male presenting to clinic today for:  1.  Rash Patient reports onset of rash about 3-6 months ago.  He points to the right lower extremity.  He describes it as itching and burning after he is scratched it a bit.  He has been applying an antifungal cream but this has not been helping.  He denies any exposure to new products like lotions, detergents or pets.  2.  Dyspnea on exertion Patient reports 51-month history of dyspnea on exertion.  He notes that normal activities that he used to be able to tolerate he now feels that he gets easily short of breath from.  He denies any hemoptysis, fever, cough.  No orthopnea.  No increased lower extremity edema.  He was started on amiodarone about 3 months ago and he wonders if this is related.  ROS: Per HPI  Allergies  Allergen Reactions  . Phenothiazines Anaphylaxis  . Actos [Pioglitazone] Swelling   Past Medical History:  Diagnosis Date  . Arthritis   . Atrial fibrillation ([place])   . BPH (benign prostatic hypertrophy) 04/15/2010  . Cataract   . Colon polyps   . DEPRESSION 01/22/2009  . Heart valve replaced   . HYPERCHOLESTEROLEMIA 01/22/2009  . HYPERTENSION 01/22/2009   Dr. [name]  . 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 Morbid obesity ([place])   . Tremors of nervous system    ?ptsd  . Ulcer     Current Outpatient Medications:  .  acetaminophen (TYLENOL) 500 MG tablet, Take 1,000 mg by mouth every 6 (six) hours as needed (pain)., Disp: , Rfl:  .  amiodarone (PACERONE) 200 MG tablet, Take 1 Tablet Daily Monday -Friday and Take 1 Tablet Two Times Daily on Saturday and Sunday, Disp: 90 tablet, Rfl: 3 .  azelastine (ASTELIN) 0.1 % nasal spray, Place 1 spray into both nostrils 2 (two) times daily., Disp: 30 mL, Rfl: 12 .  cholecalciferol (VITAMIN D) 1000 units tablet, Take 1,000 Units by mouth daily. , Disp: , Rfl:  .  ELIQUIS 5  MG TABS tablet, TAKE ONE TABLET BY MOUTH TWICE DAILY (Patient taking differently: Take 5 mg by mouth 2 (two) times daily. ), Disp: 60 tablet, Rfl: 5 .  furosemide (LASIX) 20 MG tablet, Take 1 tablet (20 mg total) by mouth daily., Disp: 30 tablet, Rfl: 1 .  gabapentin (NEURONTIN) 300 MG capsule, Take 1 capsule (300 mg total) by mouth 2 (two) times daily., Disp: 180 capsule, Rfl: 3 .  glucose blood test strip, 1 strip by Does not apply route 3 (three) times daily. Uses true metrix strips (diabetic club), Disp: , Rfl: 12 .  insulin detemir (LEVEMIR) 100 UNIT/ML injection, Inject 0.47-0.48 mLs (47-48 Units total) into the skin 2 (two) times daily. (Patient taking differently: Inject 38-40 Units into the skin 2 (two) times daily. ), Disp: 10 mL, Rfl:  .  Insulin Pen Needle (B-D ULTRAFINE III SHORT PEN) 31G X 8 MM MISC, Use to inject Victoza qd, Disp: 100 each, Rfl: 5 .  levothyroxine (SYNTHROID) 175 MCG tablet, Take 1 tablet (175 mcg total) by mouth daily before breakfast., Disp: 30 tablet, Rfl: 2 .  lisinopril (ZESTRIL) 5 MG tablet, Take 5 mg by mouth daily., Disp: , Rfl:  .  metFORMIN (GLUCOPHAGE) 1000 MG tablet, TAKE 1 TABLET BY MOUTH TWICE DAILY WITH A MEAL. (Patient taking differently: Take 1,000 mg by mouth in the morning and  at bedtime. ), Disp: 180 tablet, Rfl: 0 .  Multiple Vitamins-Minerals (MENS MULTI VITAMIN & MINERAL PO), Take 1 tablet by mouth daily.  , Disp: , Rfl:  .  Omega-3 Fatty Acids (FISH OIL) 1200 MG CAPS, Take 1,200-2,400 mg by mouth 2 (two) times daily. Take 1 capsule (1200 mg) by mouth daily at noon and 2 capsules (2400 mg) daily at bedtime, Disp: , Rfl:  .  Semaglutide,0.25 or 0.5MG /DOS, (OZEMPIC, 0.25 OR 0.5 MG/DOSE,) 2 MG/1.5ML SOPN, Inject 0.5 mg into the skin once a week. , Disp: , Rfl:  .  simvastatin (ZOCOR) 80 MG tablet, Take 0.5 tablets (40 mg total) by mouth at bedtime., Disp: 15 tablet, Rfl: 0 .  tamsulosin (FLOMAX) 0.4 MG CAPS capsule, TAKE TWO CAPSULES BY MOUTH AT  BEDTIME, Disp: 180 capsule, Rfl: 0 .  traMADol (ULTRAM) 50 MG tablet, Take 50 mg by mouth 2 (two) times daily., Disp: , Rfl:  Social History   Socioeconomic History  . Marital status: Single    Spouse name: Not on file  . Number of children: 1  . Years of education: HS  . Highest education level: Not on file  Occupational History  . Occupation: Administrator, retired    Fish farm manager: RETIRED  Tobacco Use  . Smoking status: Former Smoker    Packs/day: 0.00    Years: 12.00    Pack years: 0.00    Types: Cigarettes    Quit date: 09/20/1961    Years since quitting: 57.6  . Smokeless tobacco: Never Used  Substance and Sexual Activity  . Alcohol use: Not Currently  . Drug use: No  . Sexual activity: Never    Comment: Has not smoked in 20 years  Other Topics Concern  . Not on file  Social History Narrative   Lives alone.   Quit smoking approximately 28 years ago.   Long haul truck driver, lives in [place].   Social Determinants of Health   Financial Resource Strain:   . Difficulty of Paying Living Expenses:   Food Insecurity:   . Worried About Charity fundraiser in the Last Year:   . Arboriculturist in the Last Year:   Transportation Needs:   . Film/video editor (Medical):   Marland Kitchen Lack of Transportation (Non-Medical):   Physical Activity:   . Days of Exercise per Week:   . Minutes of Exercise per Session:   Stress:   . Feeling of Stress :   Social Connections:   . Frequency of Communication with Friends and Family:   . Frequency of Social Gatherings with Friends and Family:   . Attends Religious Services:   . Active Member of Clubs or Organizations:   . Attends Archivist Meetings:   Marland Kitchen Marital Status:   Intimate Partner Violence:   . Fear of Current or Ex-Partner:   . Emotionally Abused:   Marland Kitchen Physically Abused:   . Sexually Abused:    Family History  Problem Relation Age of Onset  . Heart disease Father   . Congestive Heart Failure Father   .  Arthritis Father   . Diabetes Other   . Benign prostatic hyperplasia Brother     Objective: Office vital signs reviewed. BP 140/70   Pulse 90   Temp 98.6 F (37 C) (Temporal)   Ht 6\' 1"  (1.854 m)   Wt 264 lb (119.7 kg)   SpO2 96%   BMI 34.83 kg/m   Physical Examination:  General: Awake, alert, well  nourished, No acute distress HEENT: Normal, sclera white Cardio: regular rate and rhythm, S1S2 heard, 1/6 SEM appreciated at bilateral sternal borders Pulm: clear to auscultation bilaterally, no wheezes, rhonchi or rales; normal work of breathing on room air Extremities: warm, well perfused, trace ankle edema, no cyanosis or clubbing; +2 pulses bilaterally Skin: Blanching, mildly erythematous maculopapular rash with associated excoriations noted along the right ankle.  There is no vesicular formation or pustules.  No induration, warmth or oozing.  Assessment/ Plan: 74 y.o. male   1. Rash Seems to be some type of contact or irritant dermatitis.  I am going to put him on triamcinolone twice daily for about 1 to 2 weeks.  We discussed that if symptoms do not improve or if they abruptly worsen he is to be reevaluated. - triamcinolone cream (KENALOG) 0.1 %; Apply 1 application topically 2 (two) times daily. x1-2 weeks  Dispense: 30 g; Refill: 0  2. Dyspnea on exertion ?  Physical deconditioning.  No evidence of fluid overload on exam.  Lungs were clear to auscultation.  I question given use of amiodarone if this is a fibrosis developing.  Will obtain chest x-ray to further evaluate.  I will also CC to his cardiologist.  May need to consider pulmonary function testing. - DG Chest 2 View; Future   No orders of the defined types were placed in this encounter.  No orders of the defined types were placed in this encounter.    Janora Norlander, DO [place] [phone]

## 2019-05-04 ENCOUNTER — Other Ambulatory Visit: Payer: Self-pay | Admitting: Family Medicine

## 2019-05-04 DIAGNOSIS — R21 Rash and other nonspecific skin eruption: Secondary | ICD-10-CM

## 2019-05-20 ENCOUNTER — Telehealth: Payer: Self-pay | Admitting: Family Medicine

## 2019-05-20 NOTE — Telephone Encounter (Signed)
No answer, no voicemail.

## 2019-05-21 ENCOUNTER — Other Ambulatory Visit: Payer: Self-pay

## 2019-05-21 ENCOUNTER — Encounter (HOSPITAL_COMMUNITY): Payer: Self-pay

## 2019-05-21 ENCOUNTER — Ambulatory Visit (INDEPENDENT_AMBULATORY_CARE_PROVIDER_SITE_OTHER)
Admission: EM | Admit: 2019-05-21 | Discharge: 2019-05-21 | Disposition: A | Discharge status: Home / Self Care | Payer: Medicare Other | Source: Home / Self Care

## 2019-05-21 ENCOUNTER — Emergency Department (HOSPITAL_COMMUNITY)
Admission: EM | Admit: 2019-05-21 | Discharge: 2019-05-21 | Disposition: A | Discharge status: Home / Self Care | Payer: Medicare Other | Attending: Emergency Medicine | Admitting: Emergency Medicine

## 2019-05-21 DIAGNOSIS — R531 Weakness: Secondary | ICD-10-CM | POA: Diagnosis not present

## 2019-05-21 DIAGNOSIS — Z5321 Procedure and treatment not carried out due to patient leaving prior to being seen by health care provider: Secondary | ICD-10-CM | POA: Insufficient documentation

## 2019-05-21 DIAGNOSIS — R9431 Abnormal electrocardiogram [ECG] [EKG]: Secondary | ICD-10-CM | POA: Diagnosis not present

## 2019-05-21 NOTE — ED Triage Notes (Signed)
Pt reports eating a tuna sub last night and couldn't sleep last night due to continuous belching. Denies chest discomfort at this time. Reports weakness in both legs. Denies abdominal pain. No N, V,D

## 2019-05-21 NOTE — ED Notes (Signed)
ekg performed and given to PA.  PA instructed pt to go to ER for further evaluation.  Report called to Constance Holster RN at [facility] ED.

## 2019-05-21 NOTE — ED Triage Notes (Signed)
Pt reports started having a lot of "gas pain" after eating a sub yesterday for lunch.  This morning woke up with discomfort in chest and weakness in both legs.  Denies any abd pain, n/v/d.

## 2019-05-24 NOTE — Telephone Encounter (Signed)
Still no answer, no voicemail.  Will close this encounter.

## 2019-05-26 ENCOUNTER — Other Ambulatory Visit: Payer: Self-pay | Admitting: Family Medicine

## 2019-05-27 DIAGNOSIS — R300 Dysuria: Secondary | ICD-10-CM | POA: Diagnosis not present

## 2019-06-03 ENCOUNTER — Telehealth: Payer: Self-pay | Admitting: Family Medicine

## 2019-06-03 NOTE — Telephone Encounter (Signed)
Continue to give the insulin, if he starts to get numbers below 75 then let me know but those numbers look good.

## 2019-06-03 NOTE — Telephone Encounter (Signed)
Pt called stating that he wanted to come in and have his A1C checked. Tried scheduling him an appt but pt requested to just have nurse call him about it.

## 2019-06-03 NOTE — Telephone Encounter (Signed)
Lmtcb.

## 2019-06-03 NOTE — Telephone Encounter (Signed)
Patient states that his sugar has been running low.  Sometimes it is around 86 and other times it is in the 120's.  Patient would like to know what is the guideline for not giving insulin since its low.  What is the number?

## 2019-06-03 NOTE — Telephone Encounter (Signed)
Pt returned missed call from [facility] regarding his sugar levels. Made pt aware of Dr Neldon Mc note. Pt voiced understanding.

## 2019-06-18 ENCOUNTER — Other Ambulatory Visit: Payer: Self-pay | Admitting: *Deleted

## 2019-06-18 DIAGNOSIS — R21 Rash and other nonspecific skin eruption: Secondary | ICD-10-CM

## 2019-06-18 MED ORDER — TRIAMCINOLONE ACETONIDE 0.1 % EX CREA: TOPICAL_CREAM | Freq: Two times a day (BID) | CUTANEOUS | 1 refills | Status: DC

## 2019-06-21 ENCOUNTER — Telehealth: Payer: Self-pay | Admitting: Family Medicine

## 2019-06-21 ENCOUNTER — Other Ambulatory Visit: Payer: Self-pay | Admitting: *Deleted

## 2019-06-21 DIAGNOSIS — R399 Unspecified symptoms and signs involving the genitourinary system: Secondary | ICD-10-CM

## 2019-06-21 MED ORDER — SULFAMETHOXAZOLE-TRIMETHOPRIM 800-160 MG PO TABS: 1.0000 | ORAL_TABLET | Freq: Two times a day (BID) | ORAL | 0 refills | Status: DC

## 2019-06-21 NOTE — Telephone Encounter (Signed)
Bactrim Prescription sent to pharmacy, please have him come and leave urine for culture.

## 2019-06-21 NOTE — Telephone Encounter (Signed)
Aware, needs to leave urine and script is ready.

## 2019-06-21 NOTE — Telephone Encounter (Signed)
No appointments available. Please advise? 

## 2019-06-22 DIAGNOSIS — R300 Dysuria: Secondary | ICD-10-CM | POA: Diagnosis not present

## 2019-06-22 DIAGNOSIS — N3001 Acute cystitis with hematuria: Secondary | ICD-10-CM | POA: Diagnosis not present

## 2019-06-26 ENCOUNTER — Encounter: Payer: Self-pay | Admitting: Family Medicine

## 2019-06-26 ENCOUNTER — Telehealth: Payer: Self-pay | Admitting: Family Medicine

## 2019-06-26 ENCOUNTER — Other Ambulatory Visit: Payer: Self-pay

## 2019-06-26 ENCOUNTER — Telehealth: Payer: Self-pay | Admitting: Internal Medicine

## 2019-06-26 ENCOUNTER — Ambulatory Visit (INDEPENDENT_AMBULATORY_CARE_PROVIDER_SITE_OTHER): Payer: Medicare Other | Admitting: Family Medicine

## 2019-06-26 ENCOUNTER — Telehealth: Payer: Self-pay

## 2019-06-26 VITALS — BP 144/67 | HR 63 | Temp 97.9°F | Ht 73.0 in | Wt 266.0 lb

## 2019-06-26 DIAGNOSIS — I480 Paroxysmal atrial fibrillation: Secondary | ICD-10-CM | POA: Diagnosis not present

## 2019-06-26 DIAGNOSIS — K219 Gastro-esophageal reflux disease without esophagitis: Secondary | ICD-10-CM | POA: Diagnosis not present

## 2019-06-26 DIAGNOSIS — Z794 Long term (current) use of insulin: Secondary | ICD-10-CM | POA: Diagnosis not present

## 2019-06-26 DIAGNOSIS — E1140 Type 2 diabetes mellitus with diabetic neuropathy, unspecified: Secondary | ICD-10-CM

## 2019-06-26 DIAGNOSIS — I509 Heart failure, unspecified: Secondary | ICD-10-CM

## 2019-06-26 DIAGNOSIS — R6889 Other general symptoms and signs: Secondary | ICD-10-CM | POA: Diagnosis not present

## 2019-06-26 DIAGNOSIS — R809 Proteinuria, unspecified: Secondary | ICD-10-CM

## 2019-06-26 DIAGNOSIS — E538 Deficiency of other specified B group vitamins: Secondary | ICD-10-CM

## 2019-06-26 DIAGNOSIS — E1142 Type 2 diabetes mellitus with diabetic polyneuropathy: Secondary | ICD-10-CM

## 2019-06-26 DIAGNOSIS — E785 Hyperlipidemia, unspecified: Secondary | ICD-10-CM

## 2019-06-26 DIAGNOSIS — E890 Postprocedural hypothyroidism: Secondary | ICD-10-CM

## 2019-06-26 DIAGNOSIS — I10 Essential (primary) hypertension: Secondary | ICD-10-CM | POA: Diagnosis not present

## 2019-06-26 LAB — BAYER DCA HB A1C WAIVED: HB A1C (BAYER DCA - WAIVED): 7.4 % — ABNORMAL HIGH (ref <7.0)

## 2019-06-26 MED ORDER — OZEMPIC (1 MG/DOSE) 2 MG/1.5ML ~~LOC~~ SOPN: 1.0000 mg | PEN_INJECTOR | SUBCUTANEOUS | 3 refills | Status: DC

## 2019-06-26 NOTE — Telephone Encounter (Signed)
Instructed patient to start Ozempic 1mg  (x2 shots of the 0.5mg  sample given)  [id number] EXP 10/23  -instructed pt RE: hypoglycemia protocol and to call with BGs<70 and BGs persistently >250  -decrease insulin to 30 units qhS

## 2019-06-26 NOTE — Telephone Encounter (Signed)
New Message   Patient is calling because when he is walking "only" he feels like he is going to fall down or become short of breath. But it only occurs when walking. He indicates that this has been ongoing for several weeks. Please call to discuss.

## 2019-06-26 NOTE — Telephone Encounter (Signed)
[name] called from [facility] and wants to know if we can look at patients transmission and determine his fluid level based on the type of device the patient has. We are not able to read the fluid levels Per a nurse Marliss Czar)

## 2019-06-26 NOTE — Telephone Encounter (Signed)
Spoke with patient regarding shortness of breath Per patient has been going on few weeks  Worse with exertion  Scheduled visit for 6/8 with Dr [name]

## 2019-06-26 NOTE — Progress Notes (Signed)
BP (!) 144/67   Pulse 63   Temp 97.9 F (36.6 C)   Ht 6' 1" (1.854 m)   Wt 266 lb (120.7 kg)   SpO2 98%   BMI 35.09 kg/m    Subjective:   Patient ID: [name], male    DOB: 1945/11/23, 74 y.o.   MRN: [id number]  HPI: [name] is a 74 y.o. male presenting on 06/26/2019 for No chief complaint on file.   HPI Type 2 diabetes mellitus Patient comes in today for recheck of his diabetes. Patient has been currently taking Levemir 31 nightly and metformin 1000 twice daily and Ozempic 0.5 weekly. Patient is currently on an ACE inhibitor/ARB. Patient has not seen an ophthalmologist this year. Patient denies any new issues with their feet. The symptom started onset as an adult neuropathy and hypertension and hyperlipidemia and microalbuminuria ARE RELATED TO DM.  He is getting some lows in the morning but then going up through the day, his A1c is improved but still 7.4  Hypertension and A. fib and CHF Patient is currently on Eliquis and amiodarone and furosemide, and their blood pressure today is 144/67. Patient denies any lightheadedness or dizziness. Patient denies headaches, blurred vision, chest pains, shortness of breath, or weakness. Denies any side effects from medication and is content with current medication.   Patient also has a history of vitamin B12 deficiency, will recheck today  Hyperlipidemia Patient is coming in for recheck of his hyperlipidemia. The patient is currently taking fish oil and simvastatin. They deny any issues with myalgias or history of liver damage from it. They deny any focal numbness or weakness or chest pain.   Relevant past medical, surgical, family and social history reviewed and updated as indicated. Interim medical history since our last visit reviewed. Allergies and medications reviewed and updated.  Review of Systems  Constitutional: Negative for chills and fever.  Eyes: Negative for discharge.  Respiratory: Negative for shortness of  breath and wheezing.   Cardiovascular: Negative for chest pain and leg swelling.  Gastrointestinal: Negative for abdominal pain.  Genitourinary: Negative for dysuria, frequency and hematuria.  Musculoskeletal: Negative for back pain and gait problem.  Skin: Negative for rash.  Neurological: Negative for dizziness, weakness and numbness.  All other systems reviewed and are negative.   Per HPI unless specifically indicated above   Allergies as of 06/26/2019      Reactions   Phenothiazines Anaphylaxis   Actos [pioglitazone] Swelling      Medication List       Accurate as of June 26, 2019  8:51 AM. If you have any questions, ask your nurse or doctor.        acetaminophen 500 MG tablet Commonly known as: TYLENOL Take 1,000 mg by mouth every 6 (six) hours as needed (pain).   amiodarone 200 MG tablet Commonly known as: PACERONE Take 1 Tablet Daily Monday -Friday and Take 1 Tablet Two Times Daily on Saturday and Sunday   azelastine 0.1 % nasal spray Commonly known as: ASTELIN Place 1 spray into both nostrils 2 (two) times daily.   cholecalciferol 1000 units tablet Commonly known as: VITAMIN D Take 1,000 Units by mouth daily.   Eliquis 5 MG Tabs tablet Generic drug: apixaban TAKE ONE TABLET BY MOUTH TWICE DAILY What changed: how much to take   Fish Oil 1200 MG Caps Take 1,200-2,400 mg by mouth 2 (two) times daily. Take 1 capsule (1200 mg) by mouth daily at noon and 2  capsules (2400 mg) daily at bedtime   furosemide 20 MG tablet Commonly known as: LASIX Take 1 tablet (20 mg total) by mouth daily.   gabapentin 300 MG capsule Commonly known as: NEURONTIN Take 1 capsule (300 mg total) by mouth 2 (two) times daily.   glucose blood test strip 1 strip by Does not apply route 3 (three) times daily. Uses true metrix strips (diabetic club)   insulin detemir 100 UNIT/ML injection Commonly known as: LEVEMIR Inject 0.47-0.48 mLs (47-48 Units total) into the skin 2 (two) times  daily. What changed: how much to take   Insulin Pen Needle 31G X 8 MM Misc Commonly known as: B-D ULTRAFINE III SHORT PEN Use to inject Victoza qd   levothyroxine 175 MCG tablet Commonly known as: SYNTHROID Take 1 tablet (175 mcg total) by mouth daily before breakfast. (Needs labwork)   lisinopril 5 MG tablet Commonly known as: ZESTRIL Take 5 mg by mouth daily.   MENS MULTI VITAMIN & MINERAL PO Take 1 tablet by mouth daily.   metFORMIN 1000 MG tablet Commonly known as: GLUCOPHAGE TAKE 1 TABLET BY MOUTH TWICE DAILY WITH A MEAL. What changed: See the new instructions.   Ozempic (0.25 or 0.5 MG/DOSE) 2 MG/1.5ML Sopn Generic drug: Semaglutide(0.25 or 0.5MG/DOS) Inject 0.5 mg into the skin once a week.   simvastatin 80 MG tablet Commonly known as: ZOCOR Take 0.5 tablets (40 mg total) by mouth at bedtime.   sulfamethoxazole-trimethoprim 800-160 MG tablet Commonly known as: Bactrim DS Take 1 tablet by mouth 2 (two) times daily.   tamsulosin 0.4 MG Caps capsule Commonly known as: FLOMAX TAKE TWO CAPSULES BY MOUTH AT BEDTIME   traMADol 50 MG tablet Commonly known as: ULTRAM Take 50 mg by mouth 2 (two) times daily.   triamcinolone cream 0.1 % Commonly known as: KENALOG Apply topically 2 (two) times daily.        Objective:   BP (!) 144/67   Pulse 63   Temp 97.9 F (36.6 C)   Ht 6' 1" (1.854 m)   Wt 266 lb (120.7 kg)   SpO2 98%   BMI 35.09 kg/m   Wt Readings from Last 3 Encounters:  06/26/19 266 lb (120.7 kg)  05/21/19 252 lb (114.3 kg)  04/30/19 264 lb (119.7 kg)    Physical Exam Vitals and nursing note reviewed.  Constitutional:      General: He is not in acute distress.    Appearance: He is well-developed. He is not diaphoretic.  Eyes:     General: No scleral icterus.    Conjunctiva/sclera: Conjunctivae normal.  Neck:     Thyroid: No thyromegaly.  Cardiovascular:     Rate and Rhythm: Normal rate and regular rhythm.     Heart sounds: Normal heart  sounds. No murmur.  Pulmonary:     Effort: Pulmonary effort is normal. No respiratory distress.     Breath sounds: Normal breath sounds. No wheezing.  Musculoskeletal:        General: Normal range of motion.     Cervical back: Neck supple.  Lymphadenopathy:     Cervical: No cervical adenopathy.  Skin:    General: Skin is warm and dry.     Findings: No rash.  Neurological:     Mental Status: He is alert and oriented to person, place, and time.     Coordination: Coordination normal.  Psychiatric:        Behavior: Behavior normal.       Assessment & Plan:  Problem List Items Addressed This Visit      Cardiovascular and Mediastinum   Essential hypertension   Relevant Orders   CMP14+EGFR   CHF (congestive heart failure) (HCC)   Atrial fibrillation ([place])   Relevant Orders   CBC with Differential/Platelet     Digestive   GERD (gastroesophageal reflux disease)   Relevant Orders   CBC with Differential/Platelet     Endocrine   Postsurgical hypothyroidism   Relevant Orders   TSH   Type 2 diabetes mellitus with diabetic neuropathy, unspecified ([place]) - Primary   Relevant Medications   Semaglutide, 1 MG/DOSE, (OZEMPIC, 1 MG/DOSE,) 2 MG/1.5ML SOPN   Other Relevant Orders   Bayer DCA Hb A1c Waived   CMP14+EGFR   Diabetic neuropathy (HCC)   Relevant Medications   Semaglutide, 1 MG/DOSE, (OZEMPIC, 1 MG/DOSE,) 2 MG/1.5ML SOPN     Other   Hyperlipidemia with target LDL less than 100   Relevant Orders   Lipid panel   B12 deficiency   Relevant Orders   Vitamin B12   Microalbuminuria      Patient is currently being treated with sulfamethoxazole for a UTI that he went to there urgent care for and he feels like symptoms are improved.  Will increase his Ozempic to 1 mg and decrease his Levemir to 30 units nightly, may need to further decrease if having lows. Follow up plan: Return in about 3 months (around 09/26/2019), or if symptoms worsen or fail to improve, for Diabetes  recheck.  Counseling provided for all of the vaccine components Orders Placed This Encounter  Procedures  . [facility] Hb A1c Dutch John, MD [facility] 06/26/2019, 8:51 AM

## 2019-06-27 LAB — CMP14+EGFR
ALT: 19 IU/L (ref 0–44)
AST: 21 IU/L (ref 0–40)
Albumin/Globulin Ratio: 1.7 (ref 1.2–2.2)
Albumin: 4.2 g/dL (ref 3.7–4.7)
Alkaline Phosphatase: 63 IU/L (ref 48–121)
BUN/Creatinine Ratio: 16 (ref 10–24)
BUN: 23 mg/dL (ref 8–27)
Bilirubin Total: 0.5 mg/dL (ref 0.0–1.2)
CO2: 24 mmol/L (ref 20–29)
Calcium: 8.5 mg/dL — ABNORMAL LOW (ref 8.6–10.2)
Chloride: 99 mmol/L (ref 96–106)
Creatinine, Ser: 1.47 mg/dL — ABNORMAL HIGH (ref 0.76–1.27)
GFR calc Af Amer: 53 mL/min/{1.73_m2} — ABNORMAL LOW (ref >59)
GFR calc non Af Amer: 46 mL/min/{1.73_m2} — ABNORMAL LOW (ref >59)
Globulin, Total: 2.5 g/dL (ref 1.5–4.5)
Glucose: 185 mg/dL — ABNORMAL HIGH (ref 65–99)
Potassium: 4.5 mmol/L (ref 3.5–5.2)
Sodium: 136 mmol/L (ref 134–144)
Total Protein: 6.7 g/dL (ref 6.0–8.5)

## 2019-06-27 LAB — CBC WITH DIFFERENTIAL/PLATELET
Basophils Absolute: 0.1 10*3/uL (ref 0.0–0.2)
Basos: 1 %
EOS (ABSOLUTE): 0.2 10*3/uL (ref 0.0–0.4)
Eos: 3 %
Hematocrit: 36.9 % — ABNORMAL LOW (ref 37.5–51.0)
Hemoglobin: 12.5 g/dL — ABNORMAL LOW (ref 13.0–17.7)
Immature Grans (Abs): 0 10*3/uL (ref 0.0–0.1)
Immature Granulocytes: 1 %
Lymphocytes Absolute: 1.5 10*3/uL (ref 0.7–3.1)
Lymphs: 23 %
MCH: 33.5 pg — ABNORMAL HIGH (ref 26.6–33.0)
MCHC: 33.9 g/dL (ref 31.5–35.7)
MCV: 99 fL — ABNORMAL HIGH (ref 79–97)
Monocytes Absolute: 0.6 10*3/uL (ref 0.1–0.9)
Monocytes: 10 %
Neutrophils Absolute: 4.1 10*3/uL (ref 1.4–7.0)
Neutrophils: 62 %
Platelets: 103 10*3/uL — ABNORMAL LOW (ref 150–450)
RBC: 3.73 x10E6/uL — ABNORMAL LOW (ref 4.14–5.80)
RDW: 14.3 % (ref 11.6–15.4)
WBC: 6.5 10*3/uL (ref 3.4–10.8)

## 2019-06-27 LAB — VITAMIN B12: Vitamin B-12: 754 pg/mL (ref 232–1245)

## 2019-06-27 LAB — LIPID PANEL
Chol/HDL Ratio: 2.6 ratio (ref 0.0–5.0)
Cholesterol, Total: 126 mg/dL (ref 100–199)
HDL: 49 mg/dL (ref >39)
LDL Chol Calc (NIH): 64 mg/dL (ref 0–99)
Triglycerides: 58 mg/dL (ref 0–149)
VLDL Cholesterol Cal: 13 mg/dL (ref 5–40)

## 2019-06-27 LAB — TSH: TSH: 3.13 u[IU]/mL (ref 0.450–4.500)

## 2019-06-28 ENCOUNTER — Telehealth: Payer: Self-pay | Admitting: Family Medicine

## 2019-06-28 NOTE — Telephone Encounter (Signed)
Patient requesting call from pharmacist.

## 2019-06-28 NOTE — Telephone Encounter (Signed)
Pt called requesting to speak with Almyra Free regarding his Insulin. Wants Almyra Free to call him back when able.

## 2019-06-28 NOTE — Telephone Encounter (Signed)
No answer or VM on home or mobile.   Patient needs to reduce insulin (Levemir to 30 units nightly)--he was having low blood sugar  He was instructed to increase Ozempic to 1mg  (RX sent to [facility] for mail order)--instructed pt to double up on 0.5mg  shots until new supply is received.

## 2019-07-01 NOTE — Progress Notes (Signed)
NA, JBB, 6/7

## 2019-07-02 ENCOUNTER — Ambulatory Visit: Payer: Medicare Other | Admitting: Cardiology

## 2019-07-03 NOTE — Telephone Encounter (Signed)
No answer on home phone.  Cell number does not have voice mail set up.

## 2019-07-03 NOTE — Telephone Encounter (Signed)
Aware. 

## 2019-07-03 NOTE — Telephone Encounter (Signed)
Aware of  Lab results.

## 2019-07-05 ENCOUNTER — Ambulatory Visit (INDEPENDENT_AMBULATORY_CARE_PROVIDER_SITE_OTHER): Payer: Medicare Other | Admitting: *Deleted

## 2019-07-05 DIAGNOSIS — I442 Atrioventricular block, complete: Secondary | ICD-10-CM

## 2019-07-05 LAB — CUP PACEART REMOTE DEVICE CHECK
Battery Remaining Longevity: 99 mo
Battery Voltage: 3 V
Brady Statistic AP VP Percent: 1.71 %
Brady Statistic AP VS Percent: 0 %
Brady Statistic AS VP Percent: 98.29 %
Brady Statistic AS VS Percent: 0.01 %
Brady Statistic RA Percent Paced: 1.7 %
Brady Statistic RV Percent Paced: 99.99 %
Date Time Interrogation Session: 20210610225435
Implantable Lead Implant Date: 20180521
Implantable Lead Implant Date: 20180521
Implantable Lead Location: 753859
Implantable Lead Location: 753859
Implantable Lead Model: 3830
Implantable Lead Model: 5076
Implantable Pulse Generator Implant Date: 20180521
Lead Channel Impedance Value: 304 Ohm
Lead Channel Impedance Value: 323 Ohm
Lead Channel Impedance Value: 380 Ohm
Lead Channel Impedance Value: 437 Ohm
Lead Channel Pacing Threshold Amplitude: 0.75 V
Lead Channel Pacing Threshold Amplitude: 1 V
Lead Channel Pacing Threshold Pulse Width: 0.4 ms
Lead Channel Pacing Threshold Pulse Width: 0.4 ms
Lead Channel Sensing Intrinsic Amplitude: 0.75 mV
Lead Channel Sensing Intrinsic Amplitude: 0.75 mV
Lead Channel Sensing Intrinsic Amplitude: 19.625 mV
Lead Channel Sensing Intrinsic Amplitude: 19.625 mV
Lead Channel Setting Pacing Amplitude: 1.75 V
Lead Channel Setting Pacing Amplitude: 2.5 V
Lead Channel Setting Pacing Pulse Width: 0.4 ms
Lead Channel Setting Sensing Sensitivity: 2 mV

## 2019-07-05 NOTE — Progress Notes (Signed)
Remote pacemaker transmission.   

## 2019-07-08 ENCOUNTER — Inpatient Hospital Stay (HOSPITAL_COMMUNITY): Payer: Medicare Other | Attending: Hematology

## 2019-07-08 ENCOUNTER — Other Ambulatory Visit: Payer: Self-pay

## 2019-07-08 DIAGNOSIS — J449 Chronic obstructive pulmonary disease, unspecified: Secondary | ICD-10-CM | POA: Insufficient documentation

## 2019-07-08 DIAGNOSIS — I4891 Unspecified atrial fibrillation: Secondary | ICD-10-CM | POA: Insufficient documentation

## 2019-07-08 DIAGNOSIS — R011 Cardiac murmur, unspecified: Secondary | ICD-10-CM | POA: Diagnosis not present

## 2019-07-08 DIAGNOSIS — E538 Deficiency of other specified B group vitamins: Secondary | ICD-10-CM | POA: Insufficient documentation

## 2019-07-08 DIAGNOSIS — E785 Hyperlipidemia, unspecified: Secondary | ICD-10-CM | POA: Diagnosis not present

## 2019-07-08 DIAGNOSIS — I110 Hypertensive heart disease with heart failure: Secondary | ICD-10-CM | POA: Diagnosis not present

## 2019-07-08 DIAGNOSIS — F329 Major depressive disorder, single episode, unspecified: Secondary | ICD-10-CM | POA: Diagnosis not present

## 2019-07-08 DIAGNOSIS — R944 Abnormal results of kidney function studies: Secondary | ICD-10-CM | POA: Insufficient documentation

## 2019-07-08 DIAGNOSIS — Z87891 Personal history of nicotine dependence: Secondary | ICD-10-CM | POA: Diagnosis not present

## 2019-07-08 DIAGNOSIS — R000 Tachycardia, unspecified: Secondary | ICD-10-CM | POA: Diagnosis not present

## 2019-07-08 DIAGNOSIS — E1140 Type 2 diabetes mellitus with diabetic neuropathy, unspecified: Secondary | ICD-10-CM | POA: Diagnosis not present

## 2019-07-08 DIAGNOSIS — E1136 Type 2 diabetes mellitus with diabetic cataract: Secondary | ICD-10-CM | POA: Diagnosis not present

## 2019-07-08 DIAGNOSIS — D696 Thrombocytopenia, unspecified: Secondary | ICD-10-CM | POA: Diagnosis not present

## 2019-07-08 DIAGNOSIS — N401 Enlarged prostate with lower urinary tract symptoms: Secondary | ICD-10-CM | POA: Insufficient documentation

## 2019-07-08 DIAGNOSIS — K219 Gastro-esophageal reflux disease without esophagitis: Secondary | ICD-10-CM | POA: Insufficient documentation

## 2019-07-08 DIAGNOSIS — E890 Postprocedural hypothyroidism: Secondary | ICD-10-CM | POA: Insufficient documentation

## 2019-07-08 DIAGNOSIS — I509 Heart failure, unspecified: Secondary | ICD-10-CM | POA: Diagnosis not present

## 2019-07-08 DIAGNOSIS — I2510 Atherosclerotic heart disease of native coronary artery without angina pectoris: Secondary | ICD-10-CM | POA: Diagnosis not present

## 2019-07-08 DIAGNOSIS — G629 Polyneuropathy, unspecified: Secondary | ICD-10-CM | POA: Insufficient documentation

## 2019-07-08 LAB — COMPREHENSIVE METABOLIC PANEL
ALT: 24 U/L (ref 0–44)
AST: 22 U/L (ref 15–41)
Albumin: 4.1 g/dL (ref 3.5–5.0)
Alkaline Phosphatase: 54 U/L (ref 38–126)
Anion gap: 9 (ref 5–15)
BUN: 25 mg/dL — ABNORMAL HIGH (ref 8–23)
CO2: 27 mmol/L (ref 22–32)
Calcium: 9 mg/dL (ref 8.9–10.3)
Chloride: 99 mmol/L (ref 98–111)
Creatinine, Ser: 1.39 mg/dL — ABNORMAL HIGH (ref 0.61–1.24)
GFR calc Af Amer: 57 mL/min — ABNORMAL LOW (ref >60)
GFR calc non Af Amer: 49 mL/min — ABNORMAL LOW (ref >60)
Glucose, Bld: 199 mg/dL — ABNORMAL HIGH (ref 70–99)
Potassium: 4.9 mmol/L (ref 3.5–5.1)
Sodium: 135 mmol/L (ref 135–145)
Total Bilirubin: 0.4 mg/dL (ref 0.3–1.2)
Total Protein: 7.1 g/dL (ref 6.5–8.1)

## 2019-07-08 LAB — CBC WITH DIFFERENTIAL/PLATELET
Abs Immature Granulocytes: 0.03 10*3/uL (ref 0.00–0.07)
Basophils Absolute: 0.1 10*3/uL (ref 0.0–0.1)
Basophils Relative: 1 %
Eosinophils Absolute: 0.2 10*3/uL (ref 0.0–0.5)
Eosinophils Relative: 3 %
HCT: 39.6 % (ref 39.0–52.0)
Hemoglobin: 12.9 g/dL — ABNORMAL LOW (ref 13.0–17.0)
Immature Granulocytes: 1 %
Lymphocytes Relative: 24 %
Lymphs Abs: 1.6 10*3/uL (ref 0.7–4.0)
MCH: 33 pg (ref 26.0–34.0)
MCHC: 32.6 g/dL (ref 30.0–36.0)
MCV: 101.3 fL — ABNORMAL HIGH (ref 80.0–100.0)
Monocytes Absolute: 0.6 10*3/uL (ref 0.1–1.0)
Monocytes Relative: 9 %
Neutro Abs: 4.1 10*3/uL (ref 1.7–7.7)
Neutrophils Relative %: 62 %
Platelets: 106 10*3/uL — ABNORMAL LOW (ref 150–400)
RBC: 3.91 MIL/uL — ABNORMAL LOW (ref 4.22–5.81)
RDW: 16 % — ABNORMAL HIGH (ref 11.5–15.5)
WBC: 6.5 10*3/uL (ref 4.0–10.5)
nRBC: 0 % (ref 0.0–0.2)

## 2019-07-08 LAB — LACTATE DEHYDROGENASE: LDH: 161 U/L (ref 98–192)

## 2019-07-08 LAB — VITAMIN B12: Vitamin B-12: 368 pg/mL (ref 180–914)

## 2019-07-08 LAB — VITAMIN D 25 HYDROXY (VIT D DEFICIENCY, FRACTURES): Vit D, 25-Hydroxy: 41.29 ng/mL (ref 30–100)

## 2019-07-09 DIAGNOSIS — R8271 Bacteriuria: Secondary | ICD-10-CM | POA: Diagnosis not present

## 2019-07-09 DIAGNOSIS — N3020 Other chronic cystitis without hematuria: Secondary | ICD-10-CM | POA: Diagnosis not present

## 2019-07-09 DIAGNOSIS — N35919 Unspecified urethral stricture, male, unspecified site: Secondary | ICD-10-CM | POA: Diagnosis not present

## 2019-07-10 ENCOUNTER — Encounter (INDEPENDENT_AMBULATORY_CARE_PROVIDER_SITE_OTHER): Payer: Medicare Other | Admitting: Ophthalmology

## 2019-07-11 ENCOUNTER — Other Ambulatory Visit: Payer: Self-pay

## 2019-07-11 ENCOUNTER — Inpatient Hospital Stay (HOSPITAL_BASED_OUTPATIENT_CLINIC_OR_DEPARTMENT_OTHER): Payer: Medicare Other | Admitting: Nurse Practitioner

## 2019-07-11 ENCOUNTER — Ambulatory Visit (HOSPITAL_COMMUNITY): Payer: Medicare Other | Admitting: Nurse Practitioner

## 2019-07-11 DIAGNOSIS — D696 Thrombocytopenia, unspecified: Secondary | ICD-10-CM | POA: Diagnosis not present

## 2019-07-11 NOTE — Assessment & Plan Note (Addendum)
1.  Thrombocytopenia: - Records show he has had thrombocytopenia since 04/2017.  At that time he was diagnosed with serratia UTI.  He denied ever being treated for the UTI. - It was discussed with him that an infection could play a role in the thrombocytopenia.  And he was placed on Bactrim DS 1 tab twice daily for 10 days. - Blood work was sent off for HIV and hepatitis testing which was negative.  Peripheral smear showed no fragmentation. -He was also complaining about joint pain.  SPEP, C-reactive protein, sed rate, and rheumatoid factor were all negative. - Labs done on 07/04/2019 showed his platelet count 106, hemoglobin 12.9, WBC 6.5 - We will continue to monitor his labs and if platelet count drops below 50,000 he will be set up for a bone marrow aspirate and biopsy for further evaluation. - He will follow-up in 6 months with labs.  2.  Vitamin B12 deficiency: - Labs on 07/04/2019 shows vitamin B 12 368 - We recommend that he take vitamin B12 oral daily. -We will recheck his levels and 6 months.  3.  Elevated creatinine: -Patient's creatinine on 01/02/2019 was 1.18. -Creatinine on 07/08/2019 shows creatinine 1.39 -We will monitor again in 6 months.  4.  Health maintenance: - He has a history of smoking. -CT of the chest done 08/30/2017 was negative.

## 2019-07-11 NOTE — Progress Notes (Signed)
[facility] Follow up:    Dettinger, Fransisca Kaufmann, MD [place] [address]   DIAGNOSIS: Thrombocytopenia  CURRENT THERAPY: Observation  INTERVAL HISTORY: [name] Grade 74 y.o. male called for a telephone visit for thrombocytopenia.  Patient reports he has been doing well since last visit.  He denies any new bleeding or petechiae rash.  He denies any new pain. Denies any nausea, vomiting, or diarrhea. Denies any new pains. Had not noticed any recent bleeding such as epistaxis, hematuria or hematochezia. Denies recent chest pain on exertion, shortness of breath on minimal exertion, pre-syncopal episodes, or palpitations. Denies any numbness or tingling in hands or feet. Denies any recent fevers, infections, or recent hospitalizations. Patient reports appetite at 100% and energy level at 100%.  He is eating well maintain his weight this time.    Patient Active Problem List   Diagnosis Date Noted  . Frequency of urination and polyuria 01/29/2019  . Coronary artery disease involving native coronary artery of native heart without angina pectoris 01/27/2019  . Microalbuminuria 12/24/2018  . COPD (chronic obstructive pulmonary disease) ([place]) 06/25/2018  . Thrombocytopenia ([place]) 05/09/2018  . Diabetic neuropathy ([place]) 01/30/2018  . S/P AVR (aortic valve replacement) 11/04/2016  . Atrial fibrillation ([place]) 09/27/2016  . Symptomatic bradycardia 06/12/2016  . Urgency incontinence 11/10/2015  . Erectile dysfunction 11/10/2015  . 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 Phimosis 01/15/2015  . CHF (congestive heart failure) ([place]) 01/01/2015  . B12 deficiency 04/08/2014  . 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 Type 2 diabetes mellitus with diabetic neuropathy, unspecified ([place]) 03/12/2014  . 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 GERD (gastroesophageal reflux disease) 11/02/2012  . Obesity 08/04/2010  . Benign prostatic hyperplasia 04/15/2010  . CAROTID OCCLUSIVE  DISEASE 01/26/2010  . MURMUR 05/27/2009  . Aortic valve disease 05/27/2009  . Hyperlipidemia with target LDL less than 100 01/22/2009  . Depression 01/22/2009  . Essential hypertension 01/22/2009    is allergic to phenothiazines and actos [pioglitazone].  MEDICAL HISTORY: Past Medical History:  Diagnosis Date  . Arthritis   . Atrial fibrillation ([place])   . BPH (benign prostatic hypertrophy) 04/15/2010  . Cataract   . Colon polyps   . DEPRESSION 01/22/2009  . Heart valve replaced   . HYPERCHOLESTEROLEMIA 01/22/2009  . HYPERTENSION 01/22/2009   Dr. [name]  . 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 Morbid obesity ([place])   . Tremors of nervous system    ?ptsd  . Ulcer     SURGICAL HISTORY: Past Surgical History:  Procedure Laterality Date  . 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 APPENDECTOMY    . bilateral cataract surg    . COLONOSCOPY  04/24/2007   [name]: normal  . COLONOSCOPY WITH ESOPHAGOGASTRODUODENOSCOPY (EGD) N/A 04/05/2012   Procedure: COLONOSCOPY WITH ESOPHAGOGASTRODUODENOSCOPY (EGD);  Surgeon: Daneil Dolin, MD;  [facility];  Service: Endoscopy;  Laterality: N/A;  10;15  . DENTAL SURGERY  05/2004   Dental extractions  . EYE SURGERY Bilateral    cataract  . HEEL SPUR SURGERY Bilateral    resection of heel spur  . KNEE ARTHROSCOPY WITH LATERAL MENISECTOMY Right 04/04/2014   Procedure: KNEE ARTHROSCOPY WITH LATERAL MENISECTOMY;  Surgeon: Carole Civil, MD;  Location: [facility] ORS;  Service: Orthopedics;  Laterality: Right;  . PACEMAKER IMPLANT N/A 06/13/2016   Procedure: Pacemaker Implant- Dual Chamber;  Surgeon: Evans Lance, MD;  Location: [facility] INVASIVE CV  LAB;  Service: Cardiovascular;  Laterality: N/A;  . PACEMAKER INSERTION  2018  . THYROIDECTOMY  03/21/2013   DR [name]  . THYROIDECTOMY N/A 03/21/2013   Procedure: THYROIDECTOMY;  Surgeon: [name], MD;  Location: [place];   Service: General;  Laterality: N/A;  . TONSILLECTOMY      SOCIAL HISTORY: Social History   Socioeconomic History  . Marital status: Single    Spouse name: Not on file  . Number of children: 1  . Years of education: HS  . Highest education level: Not on file  Occupational History  . Occupation: Administrator, retired    Fish farm manager: RETIRED  Tobacco Use  . Smoking status: Former Smoker    Packs/day: 0.00    Years: 12.00    Pack years: 0.00    Types: Cigarettes    Quit date: 09/20/1961    Years since quitting: 57.8  . Smokeless tobacco: Never Used  Vaping Use  . Vaping Use: Never used  Substance and Sexual Activity  . Alcohol use: Not Currently  . Drug use: No  . Sexual activity: Never    Comment: Has not smoked in 20 years  Other Topics Concern  . Not on file  Social History Narrative   Lives alone.   Quit smoking approximately 28 years ago.   Long haul truck driver, lives in [place].   Social Determinants of Health   Financial Resource Strain:   . Difficulty of Paying Living Expenses:   Food Insecurity:   . Worried About Charity fundraiser in the Last Year:   . Arboriculturist in the Last Year:   Transportation Needs:   . Film/video editor (Medical):   Marland Kitchen Lack of Transportation (Non-Medical):   Physical Activity:   . Days of Exercise per Week:   . Minutes of Exercise per Session:   Stress:   . Feeling of Stress :   Social Connections:   . Frequency of Communication with Friends and Family:   . Frequency of Social Gatherings with Friends and Family:   . Attends Religious Services:   . Active Member of Clubs or Organizations:   . Attends Archivist Meetings:   Marland Kitchen Marital Status:   Intimate Partner Violence:   . Fear of Current or Ex-Partner:   . Emotionally Abused:   Marland Kitchen Physically Abused:   . Sexually Abused:     FAMILY HISTORY: Family History  Problem Relation Age of [place]  . Heart disease Father   . Congestive Heart Failure Father    . Arthritis Father   . Diabetes Other   . Benign prostatic hyperplasia Brother     Review of Systems  All other systems reviewed and are negative.     Vital signs: -Deferred to telephone visit  Physical Exam -Deferred due to telephone visit -Patient was alert and oriented over the phone in no acute distress   LABORATORY DATA:  CBC    Component Value Date/Time   WBC 6.5 07/08/2019 1125   RBC 3.91 (L) 07/08/2019 1125   HGB 12.9 (L) 07/08/2019 1125   HGB 12.5 (L) 06/26/2019 0913   HCT 39.6 07/08/2019 1125   HCT 36.9 (L) 06/26/2019 0913   PLT 106 (L) 07/08/2019 1125   PLT 103 (L) 06/26/2019 0913   MCV 101.3 (H) 07/08/2019 1125   MCV 99 (H) 06/26/2019 0913   MCH 33.0 07/08/2019 1125   MCHC 32.6 07/08/2019 1125   RDW 16.0 (H) 07/08/2019 1125  RDW 14.3 06/26/2019 0913   LYMPHSABS 1.6 07/08/2019 1125   LYMPHSABS 1.5 06/26/2019 0913   MONOABS 0.6 07/08/2019 1125   EOSABS 0.2 07/08/2019 1125   EOSABS 0.2 06/26/2019 0913   BASOSABS 0.1 07/08/2019 1125   BASOSABS 0.1 06/26/2019 0913    CMP     Component Value Date/Time   NA 135 07/08/2019 1125   NA 136 06/26/2019 0913   K 4.9 07/08/2019 1125   CL 99 07/08/2019 1125   CO2 27 07/08/2019 1125   GLUCOSE 199 (H) 07/08/2019 1125   BUN 25 (H) 07/08/2019 1125   BUN 23 06/26/2019 0913   CREATININE 1.39 (H) 07/08/2019 1125   CREATININE 1.16 08/02/2012 1117   CALCIUM 9.0 07/08/2019 1125   PROT 7.1 07/08/2019 1125   PROT 6.7 06/26/2019 0913   ALBUMIN 4.1 07/08/2019 1125   ALBUMIN 4.2 06/26/2019 0913   AST 22 07/08/2019 1125   ALT 24 07/08/2019 1125   ALKPHOS 54 07/08/2019 1125   BILITOT 0.4 07/08/2019 1125   BILITOT 0.5 06/26/2019 0913   GFRNONAA 49 (L) 07/08/2019 1125   GFRNONAA 64 08/02/2012 1117   GFRAA 57 (L) 07/08/2019 1125   GFRAA 74 08/02/2012 1117    All questions were answered to patient's stated satisfaction. Encouraged patient to call with any new concerns or questions before his next visit to the  cancer center and we can certain see him sooner, if needed.     ASSESSMENT and THERAPY PLAN:   Thrombocytopenia ([place]) 1.  Thrombocytopenia: - Records show he has had thrombocytopenia since 04/2017.  At that time he was diagnosed with serratia UTI.  He denied ever being treated for the UTI. - It was discussed with him that an infection could play a role in the thrombocytopenia.  And he was placed on Bactrim DS 1 tab twice daily for 10 days. - Blood work was sent off for HIV and hepatitis testing which was negative.  Peripheral smear showed no fragmentation. -He was also complaining about joint pain.  SPEP, C-reactive protein, sed rate, and rheumatoid factor were all negative. - Labs done on 07/04/2019 showed his platelet count 106, hemoglobin 12.9, WBC 6.5 - We will continue to monitor his labs and if platelet count drops below 50,000 he will be set up for a bone marrow aspirate and biopsy for further evaluation. - He will follow-up in 6 months with labs.  2.  Vitamin B12 deficiency: - Labs on 07/04/2019 shows vitamin B 12 368 - We recommend that he take vitamin B12 oral daily. -We will recheck his levels and 6 months.  3.  Elevated creatinine: -Patient's creatinine on 01/02/2019 was 1.18. -Creatinine on 07/08/2019 shows creatinine 1.39 -We will monitor again in 6 months.  4.  Health maintenance: - He has a history of smoking. -CT of the chest done 08/30/2017 was negative.   Orders Placed This Encounter  Procedures  . Lactate dehydrogenase    Standing Status:   Future    Standing Expiration Date:   07/10/2020  . CBC with Differential/Platelet    Standing Status:   Future    Standing Expiration Date:   07/10/2020  . Comprehensive metabolic panel    Standing Status:   Future    Standing Expiration Date:   07/10/2020  . Vitamin B12    Standing Status:   Future    Standing Expiration Date:   07/10/2020  . VITAMIN D 25 Hydroxy (Vit-D Deficiency, Fractures)    Standing Status:   Future  Standing Expiration Date:   07/10/2020    All questions were answered. The patient knows to call the clinic with any problems, questions or concerns. We can certainly see the patient much sooner if necessary. This note was electronically signed.  I provided 29 minutes of non face-to-face telephone visit time during this encounter, and > 50% was spent counseling as documented under my assessment & plan.  Glennie Isle, NP-C 07/11/2019

## 2019-07-21 ENCOUNTER — Other Ambulatory Visit: Payer: Self-pay | Admitting: Family Medicine

## 2019-07-23 DIAGNOSIS — Z029 Encounter for administrative examinations, unspecified: Secondary | ICD-10-CM

## 2019-07-28 ENCOUNTER — Other Ambulatory Visit: Payer: Self-pay | Admitting: Family

## 2019-07-28 DIAGNOSIS — J069 Acute upper respiratory infection, unspecified: Secondary | ICD-10-CM

## 2019-07-31 ENCOUNTER — Telehealth: Payer: Self-pay | Admitting: Family Medicine

## 2019-07-31 NOTE — Telephone Encounter (Signed)
Pt called requesting to speak with Dr Neldon Mc nurse. He mentioned needing a handicap form signed and I told him he could bring it in and we would get Dr Dettinger to sign it when able to but he still requested to speak with nurse.

## 2019-07-31 NOTE — Telephone Encounter (Signed)
Aware, form has been ready but he must pay $5.00 for it.

## 2019-08-01 ENCOUNTER — Telehealth: Payer: Self-pay | Admitting: Family Medicine

## 2019-08-05 ENCOUNTER — Telehealth: Payer: Self-pay | Admitting: Family Medicine

## 2019-08-05 NOTE — Progress Notes (Signed)
Cardiology Office Note   Date:  08/07/2019   ID:  [name], DOB 09/26/1945, MRN [id number]  PCP:  Dettinger, Fransisca Kaufmann, MD  Cardiologist:   No primary care provider on file.   Chief Complaint  Patient presents with  . Shortness of Breath      History of Present Illness: [name] is a 74 y.o. male who for followup of aortic valve replacement, follow up of CAD, atrial fib and pacemaker placement for symptomatic bradycardia.  Since I last saw him he called with increased dyspnea.  He has had some increased shortness of breath he says for 3 or 4 months.  He will get short of breath carrying in groceries.  He is not describing chest pressure, neck or arm discomfort.  He is not describing PND or orthopnea.  He is limited by back pain.  He has had no new significant weight gain or swelling by his measure.  He is not feeling his heart racing or skipping.  He is not having any cough fevers or chills.    Past Medical History:  Diagnosis Date  . Arthritis   . Atrial fibrillation ([place])   . BPH (benign prostatic hypertrophy) 04/15/2010  . Cataract   . Colon polyps   . DEPRESSION 01/22/2009  . Heart valve replaced   . HYPERCHOLESTEROLEMIA 01/22/2009  . HYPERTENSION 01/22/2009   Dr. [name]  . 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 Morbid obesity ([place])   . Tremors of nervous system    ?ptsd  . Ulcer     Past Surgical History:  Procedure Laterality Date  . 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 APPENDECTOMY    . bilateral cataract surg    . COLONOSCOPY  04/24/2007   [name]: normal  . COLONOSCOPY WITH ESOPHAGOGASTRODUODENOSCOPY (EGD) N/A 04/05/2012   Procedure: COLONOSCOPY WITH ESOPHAGOGASTRODUODENOSCOPY (EGD);  Surgeon: Daneil Dolin, MD;  [facility];  Service: Endoscopy;  Laterality: N/A;  10;15  . DENTAL SURGERY  05/2004   Dental extractions  . EYE SURGERY Bilateral    cataract  .  HEEL SPUR SURGERY Bilateral    resection of heel spur  . KNEE ARTHROSCOPY WITH LATERAL MENISECTOMY Right 04/04/2014   Procedure: KNEE ARTHROSCOPY WITH LATERAL MENISECTOMY;  Surgeon: Carole Civil, MD;  Location: [facility];  Service: Orthopedics;  Laterality: Right;  . PACEMAKER IMPLANT N/A 06/13/2016   Procedure: Pacemaker Implant- Dual Chamber;  Surgeon: Evans Lance, MD;  Location: [facility] CV LAB;  Service: Cardiovascular;  Laterality: N/A;  . PACEMAKER INSERTION  2018  . THYROIDECTOMY  03/21/2013   DR [name]  . THYROIDECTOMY N/A 03/21/2013   Procedure: THYROIDECTOMY;  Surgeon: [name], MD;  Location: [place];  Service: General;  Laterality: N/A;  . TONSILLECTOMY       Current Outpatient Medications  Medication Sig Dispense Refill  . acetaminophen (TYLENOL) 500 MG tablet Take 1,000 mg by mouth every 6 (six) hours as needed (pain).    Marland Kitchen amiodarone (PACERONE) 200 MG tablet Take 1 Tablet Daily Monday -Friday and Take 1 Tablet Two Times Daily on Saturday and Sunday 90 tablet 3  . azelastine (ASTELIN) 0.1 % nasal spray Place 1 spray into both nostrils 2 (two) times daily. 30 mL 12  . cholecalciferol (VITAMIN D) 1000 units tablet Take 1,000 Units by mouth daily.     Marland Kitchen ELIQUIS 5 MG TABS tablet TAKE ONE TABLET  BY MOUTH TWICE DAILY (Patient taking differently: Take 5 mg by mouth 2 (two) times daily. ) 60 tablet 5  . furosemide (LASIX) 20 MG tablet Take 1 tablet (20 mg total) by mouth daily. 30 tablet 1  . gabapentin (NEURONTIN) 300 MG capsule Take 1 capsule (300 mg total) by mouth 2 (two) times daily. 180 capsule 3  . insulin detemir (LEVEMIR) 100 UNIT/ML injection Inject 0.47-0.48 mLs (47-48 Units total) into the skin 2 (two) times daily. (Patient taking differently: Inject 38-40 Units into the skin 2 (two) times daily. ) 10 mL   . levothyroxine (SYNTHROID) 175 MCG tablet Take 1 tablet (175 mcg total) by mouth daily before breakfast. (Needs labwork) 30 tablet 0  . metFORMIN (GLUCOPHAGE)  1000 MG tablet TAKE 1 TABLET BY MOUTH TWICE DAILY WITH A MEAL. (Patient taking differently: Take 1,000 mg by mouth in the morning and at bedtime. ) 180 tablet 0  . Multiple Vitamins-Minerals (MENS MULTI VITAMIN & MINERAL PO) Take 1 tablet by mouth daily.      . Omega-3 Fatty Acids (FISH OIL) 1200 MG CAPS Take 1,200-2,400 mg by mouth 2 (two) times daily. Take 1 capsule (1200 mg) by mouth daily at noon and 2 capsules (2400 mg) daily at bedtime    . Semaglutide, 1 MG/DOSE, (OZEMPIC, 1 MG/DOSE,) 2 MG/1.5ML SOPN Inject 1 mg into the skin once a week. 4 pen 3  . simvastatin (ZOCOR) 80 MG tablet Take 0.5 tablets (40 mg total) by mouth at bedtime. 15 tablet 0  . tamsulosin (FLOMAX) 0.4 MG CAPS capsule TAKE TWO CAPSULES BY MOUTH AT BEDTIME 180 capsule 0  . traMADol (ULTRAM) 50 MG tablet Take 50 mg by mouth 2 (two) times daily.    Marland Kitchen triamcinolone cream (KENALOG) 0.1 % Apply topically 2 (two) times daily. 30 g 1  . glucose blood test strip 1 strip by Does not apply route 3 (three) times daily. Uses true metrix strips (diabetic club)  12  . lisinopril (ZESTRIL) 5 MG tablet Take 5 mg by mouth daily.     No current facility-administered medications for this visit.    Allergies:   Phenothiazines and Actos [pioglitazone]    ROS:  Please see the history of present illness.   Otherwise, review of systems are positive for none.   All other systems are reviewed and negative.    PHYSICAL EXAM: VS:  BP 104/60   Pulse 69   Ht 6\' 1"  (1.854 m)   Wt 261 lb (118.4 kg)   BMI 34.43 kg/m  , BMI Body mass index is 34.43 kg/m. GENERAL:  Well appearing NECK:  No jugular venous distention, waveform within normal limits, carotid upstroke brisk and symmetric, no bruits, no thyromegaly LUNGS:  Clear to auscultation bilaterally CHEST: 2 out of 6 brief apical systolic murmur nonradiating, HEART:  PMI not displaced or sustained,S1 and S2 within normal limits, no S3, no S4, no clicks, no rubs, brief apical systolic murmur,  no diastolic murmurs ABD:  Flat, positive bowel sounds normal in frequency in pitch, no bruits, no rebound, no guarding, no midline pulsatile mass, no hepatomegaly, no splenomegaly EXT:  2 plus pulses throughout, bilateral mild lower extremity edema, no cyanosis no clubbing  EKG:  EKG is  ordered today. The ekg ordered today demonstrates sinus rhythm with ventricular pacing 100% capture   Recent Labs: 06/26/2019: TSH 3.130 07/08/2019: ALT 24; BUN 25; Creatinine, Ser 1.39; Hemoglobin 12.9; Platelets 106; Potassium 4.9; Sodium 135    Lipid Panel  Component Value Date/Time   CHOL 126 06/26/2019 0913   CHOL 136 08/02/2012 1117   TRIG 58 06/26/2019 0913   TRIG 98 12/09/2013 1054   TRIG 108 08/02/2012 1117   HDL 49 06/26/2019 0913   HDL 49 12/09/2013 1054   HDL 36 (L) 08/02/2012 1117   CHOLHDL 2.6 06/26/2019 0913   CHOLHDL 3.0 06/12/2016 1531   VLDL 28 06/12/2016 1531   LDLCALC 64 06/26/2019 0913   LDLCALC 95 08/30/2013 0812   LDLCALC 78 08/02/2012 1117      Wt Readings from Last 3 Encounters:  08/07/19 261 lb (118.4 kg)  06/26/19 266 lb (120.7 kg)  05/21/19 252 lb (114.3 kg)      Other studies Reviewed: Additional studies/ records that were reviewed today include: Labs Review of the above records demonstrates:  Please see elsewhere in the note.     ASSESSMENT AND PLAN:  CAD: At this point I do not think this is an anginal equivalent his dyspnea but will potentially evaluate this if no other clear etiology is identified and his symptoms worsen.   CHRONIC SYSTOLIC AND DIASTOLIC HF:   I think he is likely euvolemic.  He understands salt and fluid restriction.  AORTIC VALVE REPLACEMENT, HX OF: I did not consider reevaluating this for further work-up of his dyspnea pending the results below.   HYPERTENSION:  His blood pressure is well controlled.  No change in therapy.   CAROTID OCCLUSIVE DISEASE -  He needs carotid Doppler and I will arrange this.  PPM: He is  up to date with follow up and saw Dr. Lovena Le earlier this year.  I did review these records for this visit.  He has had some short paroxysms of fibrillation.   PAROXYSMAL ATRIAL FIB - He tolerates anticoagulation.  He had one dose of atrial fib/flutter on his last device interrogation.   Of note he is on on the dosing but seems to be for the most part keeping him in sinus rhythm so I will continue his amiodarone.  DM: A1C was 7.4 which is better than previous.   DYSPNEA: I am going to start with pulmonary function testing.  I will consider ischemia work-up and further imaging of his valve pending these results.  Given his amiodarone I proceed the PFTs first.  Current medicines are reviewed at length with the patient today.  The patient does not have concerns regarding medicines.  The following changes have been made:  None  Labs/ tests ordered today include:   Orders Placed This Encounter  Procedures  . US Carotid Bilateral  . Pulmonary Function Test     Disposition:   FU with me in 2 months.   Signed, Minus Breeding, MD  08/07/2019 1:02 PM    [place]

## 2019-08-06 ENCOUNTER — Telehealth: Payer: Self-pay | Admitting: Family Medicine

## 2019-08-06 NOTE — Telephone Encounter (Signed)
Pt states his legs feel weak when he walks but no c/o being lightheaded or dizzy. He says this has been going on for about a month. His FBS are averaging 125 and in the afternoon 200. Offered multiple appointments with acute providers tomorrow but pt only wants to see Dr Warrick Parisian and then I offered him an appt with Dr Dettinger 08/12/19 at 2:10 but pt declined saying he wanted something around 69 and only with Dr Dettinger so he would just call back later and schedule.

## 2019-08-07 ENCOUNTER — Ambulatory Visit (INDEPENDENT_AMBULATORY_CARE_PROVIDER_SITE_OTHER): Payer: Medicare Other | Admitting: Cardiology

## 2019-08-07 ENCOUNTER — Encounter: Payer: Self-pay | Admitting: Cardiology

## 2019-08-07 ENCOUNTER — Other Ambulatory Visit: Payer: Self-pay

## 2019-08-07 VITALS — BP 104/60 | HR 69 | Ht 73.0 in | Wt 261.0 lb

## 2019-08-07 DIAGNOSIS — I2510 Atherosclerotic heart disease of native coronary artery without angina pectoris: Secondary | ICD-10-CM | POA: Diagnosis not present

## 2019-08-07 DIAGNOSIS — R0600 Dyspnea, unspecified: Secondary | ICD-10-CM

## 2019-08-07 DIAGNOSIS — I10 Essential (primary) hypertension: Secondary | ICD-10-CM

## 2019-08-07 DIAGNOSIS — I6529 Occlusion and stenosis of unspecified carotid artery: Secondary | ICD-10-CM | POA: Diagnosis not present

## 2019-08-07 DIAGNOSIS — Z952 Presence of prosthetic heart valve: Secondary | ICD-10-CM

## 2019-08-07 DIAGNOSIS — R0609 Other forms of dyspnea: Secondary | ICD-10-CM

## 2019-08-07 DIAGNOSIS — I5042 Chronic combined systolic (congestive) and diastolic (congestive) heart failure: Secondary | ICD-10-CM | POA: Diagnosis not present

## 2019-08-07 NOTE — Patient Instructions (Addendum)
Medication Instructions:  The current medical regimen is effective;  continue present plan and medications.  *If you need a refill on your cardiac medications before your next appointment, please call your pharmacy*  Testing/Procedures: Your physician has requested that you have a carotid duplex. This test is an ultrasound of the carotid arteries in your neck. It looks at blood flow through these arteries that supply the brain with blood. Allow one hour for this exam. There are no restrictions or special instructions.  Your physician has recommended that you have a pulmonary function test. Pulmonary Function Tests are a group of tests that measure how well air moves in and out of your lungs.  You will need Covid screening before this testing.  Follow-Up: At [facility], you and your health needs are our priority.  As part of our continuing mission to provide you with exceptional heart care, we have created designated Provider Care Teams.  These Care Teams include your primary Cardiologist (physician) and Advanced Practice Providers (APPs -  Physician Assistants and Nurse Practitioners) who all work together to provide you with the care you need, when you need it.  We recommend signing up for the patient portal called "MyChart".  Sign up information is provided on this After Visit Summary.  MyChart is used to connect with patients for Virtual Visits (Telemedicine).  Patients are able to view lab/test results, encounter notes, upcoming appointments, etc.  Non-urgent messages can be sent to your provider as well.   To learn more about what you can do with MyChart, go to [email or web address].    Your next appointment:   2 month(s)  The format for your next appointment:   In Person  Provider:   Minus Breeding, MD   Thank you for choosing [facility]!!

## 2019-08-08 NOTE — Telephone Encounter (Signed)
Call placed to patient No VM set up Will await call back

## 2019-08-09 NOTE — Addendum Note (Signed)
Addended by: Shellia Cleverly on: 08/09/2019 08:31 AM   Modules accepted: Orders

## 2019-08-14 NOTE — Telephone Encounter (Signed)
Phone cal taken care of in different encounter. This encounter will now be closed

## 2019-08-15 ENCOUNTER — Other Ambulatory Visit: Payer: Self-pay | Admitting: Family

## 2019-08-15 DIAGNOSIS — J069 Acute upper respiratory infection, unspecified: Secondary | ICD-10-CM

## 2019-08-16 ENCOUNTER — Telehealth: Payer: Self-pay | Admitting: Cardiology

## 2019-08-16 NOTE — Telephone Encounter (Signed)
Pre-cert Verification for the following procedure    US CAROTID DUPLEX   DATE:   08/21/2019  LOCATION:   [facility]

## 2019-08-19 ENCOUNTER — Telehealth: Payer: Self-pay | Admitting: Cardiology

## 2019-08-19 ENCOUNTER — Other Ambulatory Visit: Payer: Self-pay | Admitting: *Deleted

## 2019-08-19 DIAGNOSIS — I6523 Occlusion and stenosis of bilateral carotid arteries: Secondary | ICD-10-CM

## 2019-08-19 NOTE — Telephone Encounter (Signed)
Per chart review-patient scheduled for appt with Dr. Gwenlyn Found 7/23. appt cancelled 7/26 "provider"     Reached out to scheduler who cancelled.  They will call to discuss.

## 2019-08-19 NOTE — Telephone Encounter (Signed)
Patient called to confirm his OV appt with Dr. Gwenlyn Found for a PV consult on 8/3 at 2pm. Advised the patient that this appt was cancelled this morning by the provider, patient stated he was unaware it had been cancelled. Please advise.

## 2019-08-20 ENCOUNTER — Ambulatory Visit (INDEPENDENT_AMBULATORY_CARE_PROVIDER_SITE_OTHER): Payer: Medicare Other

## 2019-08-20 DIAGNOSIS — I6523 Occlusion and stenosis of bilateral carotid arteries: Secondary | ICD-10-CM | POA: Diagnosis not present

## 2019-08-21 ENCOUNTER — Ambulatory Visit (HOSPITAL_COMMUNITY): Payer: Medicare Other

## 2019-08-22 ENCOUNTER — Ambulatory Visit
Admission: EM | Admit: 2019-08-22 | Discharge: 2019-08-22 | Disposition: A | Discharge status: Home / Self Care | Payer: Medicare Other | Attending: Emergency Medicine | Admitting: Emergency Medicine

## 2019-08-22 ENCOUNTER — Other Ambulatory Visit: Payer: Self-pay

## 2019-08-22 DIAGNOSIS — R3915 Urgency of urination: Secondary | ICD-10-CM | POA: Diagnosis not present

## 2019-08-22 DIAGNOSIS — R300 Dysuria: Secondary | ICD-10-CM | POA: Diagnosis not present

## 2019-08-22 DIAGNOSIS — R350 Frequency of micturition: Secondary | ICD-10-CM | POA: Diagnosis not present

## 2019-08-22 LAB — POCT URINALYSIS DIP (MANUAL ENTRY)
Bilirubin, UA: NEGATIVE
Glucose, UA: 100 mg/dL — AB
Ketones, POC UA: NEGATIVE mg/dL
Nitrite, UA: NEGATIVE
Protein Ur, POC: NEGATIVE mg/dL
Spec Grav, UA: 1.015 (ref 1.010–1.025)
Urobilinogen, UA: 0.2 E.U./dL
pH, UA: 5.5 (ref 5.0–8.0)

## 2019-08-22 MED ORDER — PHENAZOPYRIDINE HCL 200 MG PO TABS
200.0000 mg | ORAL_TABLET | Freq: Three times a day (TID) | ORAL | 0 refills | Status: DC
Start: 2019-08-22 — End: 2020-02-05

## 2019-08-22 MED ORDER — CEPHALEXIN 500 MG PO CAPS: 500.0000 mg | ORAL_CAPSULE | Freq: Two times a day (BID) | ORAL | 0 refills | Status: AC

## 2019-08-22 NOTE — ED Triage Notes (Signed)
Pt presents with c/o pain with urination

## 2019-08-22 NOTE — Discharge Instructions (Signed)
Urine culture sent.  We will call you with abnormal results.   Push fluids and get plenty of rest.   Take antibiotic as directed and to completion Take pyridium as prescribed and as needed for symptomatic relief Follow up with PCP if symptoms persists Return here or go to ER if you have any new or worsening symptoms such as fever, abdominal pain, nausea/vomiting, flank pain, etc..Marland Kitchen

## 2019-08-22 NOTE — ED Provider Notes (Signed)
[facility]   CC: Burning with urination  SUBJECTIVE:  [name] is a 74 y.o. male who complains of dysuria, urinary frequency and urgency x couple of days.  Patient denies a precipitating event, recent sexual encounter, excessive caffeine intake.  Denies associated back or abdominal pain.  Has NOT tried OTC medications.  Symptoms are made worse with urination.  Admits to similar symptoms in the past with UTI.  Denies fever, chills, nausea, vomiting, abdominal pain, flank pain, hematuria.    LMP: No LMP for male patient.  ROS: As in HPI.  All other pertinent ROS negative.     Past Medical History:  Diagnosis Date  . Arthritis   . Atrial fibrillation ([place])   . BPH (benign prostatic hypertrophy) 04/15/2010  . Cataract   . Colon polyps   . DEPRESSION 01/22/2009  . Heart valve replaced   . HYPERCHOLESTEROLEMIA 01/22/2009  . HYPERTENSION 01/22/2009   Dr. [name]  . 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 Morbid obesity ([place])   . Tremors of nervous system    ?ptsd  . Ulcer    Past Surgical History:  Procedure Laterality Date  . 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 APPENDECTOMY    . bilateral cataract surg    . COLONOSCOPY  04/24/2007   [name]: normal  . COLONOSCOPY WITH ESOPHAGOGASTRODUODENOSCOPY (EGD) N/A 04/05/2012   Procedure: COLONOSCOPY WITH ESOPHAGOGASTRODUODENOSCOPY (EGD);  Surgeon: Daneil Dolin, MD;  [facility];  Service: Endoscopy;  Laterality: N/A;  10;15  . DENTAL SURGERY  05/2004   Dental extractions  . EYE SURGERY Bilateral    cataract  . HEEL SPUR SURGERY Bilateral    resection of heel spur  . KNEE ARTHROSCOPY WITH LATERAL MENISECTOMY Right 04/04/2014   Procedure: KNEE ARTHROSCOPY WITH LATERAL MENISECTOMY;  Surgeon: Carole Civil, MD;  Location: [facility];  Service: Orthopedics;  Laterality: Right;  . PACEMAKER IMPLANT N/A 06/13/2016   Procedure: Pacemaker  Implant- Dual Chamber;  Surgeon: Evans Lance, MD;  Location: [facility];  Service: Cardiovascular;  Laterality: N/A;  . PACEMAKER INSERTION  2018  . THYROIDECTOMY  03/21/2013   DR [name]  . THYROIDECTOMY N/A 03/21/2013   Procedure: THYROIDECTOMY;  Surgeon: [name], MD;  Location: [facility];  Service: General;  Laterality: N/A;  . TONSILLECTOMY     Allergies  Allergen Reactions  . Phenothiazines Anaphylaxis  . Actos [Pioglitazone] Swelling   No current facility-administered medications on file prior to encounter.   Current Outpatient Medications on File Prior to Encounter  Medication Sig Dispense Refill  . acetaminophen (TYLENOL) 500 MG tablet Take 1,000 mg by mouth every 6 (six) hours as needed (pain).    Marland Kitchen amiodarone (PACERONE) 200 MG tablet Take 1 Tablet Daily Monday -Friday and Take 1 Tablet Two Times Daily on Saturday and Sunday 90 tablet 3  . azelastine (ASTELIN) 0.1 % nasal spray Place 1 spray into both nostrils 2 (two) times daily. 30 mL 12  . cholecalciferol (VITAMIN D) 1000 units tablet Take 1,000 Units by mouth daily.     Marland Kitchen ELIQUIS 5 MG TABS tablet TAKE ONE TABLET BY MOUTH TWICE DAILY (Patient taking differently: Take 5 mg by mouth 2 (two) times daily. ) 60 tablet 5  . furosemide (LASIX) 20 MG tablet Take 1 tablet (20 mg total) by mouth daily. 30 tablet 1  . gabapentin (NEURONTIN) 300 MG capsule Take 1 capsule (300  mg total) by mouth 2 (two) times daily. 180 capsule 3  . glucose blood test strip 1 strip by Does not apply route 3 (three) times daily. Uses true metrix strips (diabetic club)  12  . insulin detemir (LEVEMIR) 100 UNIT/ML injection Inject 0.47-0.48 mLs (47-48 Units total) into the skin 2 (two) times daily. (Patient taking differently: Inject 38-40 Units into the skin 2 (two) times daily. ) 10 mL   . levothyroxine (SYNTHROID) 175 MCG tablet Take 1 tablet (175 mcg total) by mouth daily before breakfast. (Needs labwork) 30 tablet 0  . lisinopril (ZESTRIL) 5  MG tablet Take 5 mg by mouth daily.    . metFORMIN (GLUCOPHAGE) 1000 MG tablet TAKE 1 TABLET BY MOUTH TWICE DAILY WITH A MEAL. (Patient taking differently: Take 1,000 mg by mouth in the morning and at bedtime. ) 180 tablet 0  . Multiple Vitamins-Minerals (MENS MULTI VITAMIN & MINERAL PO) Take 1 tablet by mouth daily.      . Omega-3 Fatty Acids (FISH OIL) 1200 MG CAPS Take 1,200-2,400 mg by mouth 2 (two) times daily. Take 1 capsule (1200 mg) by mouth daily at noon and 2 capsules (2400 mg) daily at bedtime    . Semaglutide, 1 MG/DOSE, (OZEMPIC, 1 MG/DOSE,) 2 MG/1.5ML SOPN Inject 1 mg into the skin once a week. 4 pen 3  . simvastatin (ZOCOR) 80 MG tablet Take 0.5 tablets (40 mg total) by mouth at bedtime. 15 tablet 0  . tamsulosin (FLOMAX) 0.4 MG CAPS capsule TAKE TWO CAPSULES BY MOUTH AT BEDTIME 180 capsule 0  . traMADol (ULTRAM) 50 MG tablet Take 50 mg by mouth 2 (two) times daily.    Marland Kitchen triamcinolone cream (KENALOG) 0.1 % Apply topically 2 (two) times daily. 30 g 1   Social History   Socioeconomic History  . Marital status: Single    Spouse name: Not on file  . Number of children: 1  . Years of education: HS  . Highest education level: Not on file  Occupational History  . Occupation: Administrator, retired    Fish farm manager: RETIRED  Tobacco Use  . Smoking status: Former Smoker    Packs/day: 0.00    Years: 12.00    Pack years: 0.00    Types: Cigarettes    Quit date: 09/20/1961    Years since quitting: 57.9  . Smokeless tobacco: Never Used  Vaping Use  . Vaping Use: Never used  Substance and Sexual Activity  . Alcohol use: Not Currently  . Drug use: No  . Sexual activity: Never    Comment: Has not smoked in 20 years  Other Topics Concern  . Not on file  Social History Narrative   Lives alone.   Quit smoking approximately 28 years ago.   Long haul truck driver, lives in [place].   Social Determinants of Health   Financial Resource Strain:   . Difficulty of Paying Living  Expenses:   Food Insecurity:   . Worried About Charity fundraiser in the Last Year:   . Arboriculturist in the Last Year:   Transportation Needs:   . Film/video editor (Medical):   Marland Kitchen Lack of Transportation (Non-Medical):   Physical Activity:   . Days of Exercise per Week:   . Minutes of Exercise per Session:   Stress:   . Feeling of Stress :   Social Connections:   . Frequency of Communication with Friends and Family:   . Frequency of Social Gatherings with Friends and  Family:   . Attends Religious Services:   . Active Member of Clubs or Organizations:   . Attends Archivist Meetings:   Marland Kitchen Marital Status:   Intimate Partner Violence:   . Fear of Current or Ex-Partner:   . Emotionally Abused:   Marland Kitchen Physically Abused:   . Sexually Abused:    Family History  Problem Relation Age of Onset  . Heart disease Father   . Congestive Heart Failure Father   . Arthritis Father   . Diabetes Other   . Benign prostatic hyperplasia Brother     OBJECTIVE:  Vitals:   08/22/19 1054  BP: (!) 165/79  Pulse: 65  Resp: 20  Temp: 97.9 F (36.6 C)  SpO2: 97%   General appearance: Alert in no acute distress HEENT: NCAT.  Oropharynx clear.  Lungs: clear to auscultation bilaterally without adventitious breath sounds Heart: Murmur present Abdomen: soft; non-distended; no tenderness; bowel sounds present; no guarding Extremities: no edema; symmetrical with no gross deformities Skin: warm and dry Neurologic: Ambulates from chair to exam table without difficulty Psychological: alert and cooperative; normal mood and affect  Labs Reviewed  POCT URINALYSIS DIP (MANUAL ENTRY) - Abnormal; Notable for the following components:      Result Value   Clarity, UA cloudy (*)    Glucose, UA =100 (*)    Blood, UA trace-intact (*)    Leukocytes, UA Large (3+) (*)    All other components within normal limits  URINE CULTURE    ASSESSMENT & PLAN:  1. Dysuria   2. Urinary frequency    3. Urinary urgency     Meds ordered this encounter  Medications  . cephALEXin (KEFLEX) 500 MG capsule    Sig: Take 1 capsule (500 mg total) by mouth 2 (two) times daily for 10 days.    Dispense:  20 capsule    Refill:  0    Order Specific Question:   Supervising Provider    Answer:   Raylene Everts [[id number]]  . phenazopyridine (PYRIDIUM) 200 MG tablet    Sig: Take 1 tablet (200 mg total) by mouth 3 (three) times daily.    Dispense:  6 tablet    Refill:  0    Order Specific Question:   Supervising Provider    Answer:   Raylene Everts [[id number]]    Urine culture sent.  We will call you with abnormal results.   Push fluids and get plenty of rest.   Take antibiotic as directed and to completion Take pyridium as prescribed and as needed for symptomatic relief Follow up with PCP if symptoms persists Return here or go to ER if you have any new or worsening symptoms such as fever, abdominal pain, nausea/vomiting, flank pain, etc...  Outlined signs and symptoms indicating need for more acute intervention. Patient verbalized understanding. After Visit Summary given.     Lestine Box, PA-C 08/22/19 1117

## 2019-08-24 LAB — URINE CULTURE: Culture: >=100000 — AB

## 2019-08-26 ENCOUNTER — Telehealth (HOSPITAL_COMMUNITY): Payer: Self-pay | Admitting: Emergency Medicine

## 2019-08-26 MED ORDER — SULFAMETHOXAZOLE-TRIMETHOPRIM 800-160 MG PO TABS: 1.0000 | ORAL_TABLET | Freq: Two times a day (BID) | ORAL | 0 refills | Status: AC

## 2019-08-26 NOTE — Telephone Encounter (Signed)
Recent urine culture shows patient will need to be changed from Keflex to Bactrim.  Attempted to call patient to review, no answer on both numbers, unable to leave a voicemail.  Bactrim sent to pharmacy on file

## 2019-08-27 ENCOUNTER — Encounter (INDEPENDENT_AMBULATORY_CARE_PROVIDER_SITE_OTHER): Payer: Self-pay | Admitting: Ophthalmology

## 2019-08-27 ENCOUNTER — Ambulatory Visit (INDEPENDENT_AMBULATORY_CARE_PROVIDER_SITE_OTHER): Payer: Medicare Other | Admitting: Ophthalmology

## 2019-08-27 ENCOUNTER — Ambulatory Visit: Payer: Medicare Other | Admitting: Cardiovascular Disease

## 2019-08-27 ENCOUNTER — Other Ambulatory Visit: Payer: Self-pay

## 2019-08-27 DIAGNOSIS — H4312 Vitreous hemorrhage, left eye: Secondary | ICD-10-CM

## 2019-08-27 DIAGNOSIS — E113491 Type 2 diabetes mellitus with severe nonproliferative diabetic retinopathy without macular edema, right eye: Secondary | ICD-10-CM | POA: Insufficient documentation

## 2019-08-27 DIAGNOSIS — E113493 Type 2 diabetes mellitus with severe nonproliferative diabetic retinopathy without macular edema, bilateral: Secondary | ICD-10-CM | POA: Diagnosis not present

## 2019-08-27 DIAGNOSIS — H43811 Vitreous degeneration, right eye: Secondary | ICD-10-CM

## 2019-08-27 DIAGNOSIS — E113592 Type 2 diabetes mellitus with proliferative diabetic retinopathy without macular edema, left eye: Secondary | ICD-10-CM

## 2019-08-27 HISTORY — DX: Type 2 diabetes mellitus with severe nonproliferative diabetic retinopathy without macular edema, bilateral: E11.3493

## 2019-08-27 HISTORY — DX: Vitreous hemorrhage, left eye: H43.12

## 2019-08-27 NOTE — Assessment & Plan Note (Signed)
Stable, no active NVE seen today

## 2019-08-27 NOTE — Progress Notes (Signed)
08/27/2019     CHIEF COMPLAINT Patient presents for Retina Follow Up   HISTORY OF PRESENT ILLNESS: [name] is a 74 y.o. male who presents to the clinic today for:   HPI    Retina Follow Up    Patient presents with  Other.  In both eyes.  This started 1 year ago.  Severity is mild.  Duration of 1 year.  Since onset it is stable.          Comments    1 Year Diabetic F/U OU  Pt c/o intermittent blur OU lasting a couple minutes at a time. Pt states, "my eyes go crossed."  Pt denies ocular pain, flashes of light, or floaters OU.  A1c: 7.2, 02/2019 LBS: 135 this AM        Last edited by Rockie Neighbours, [place] on 08/27/2019  1:12 PM. (History)      Referring physician: Dettinger, Fransisca Kaufmann, MD [place],   [id number]  HISTORICAL INFORMATION:   Selected notes from the MEDICAL RECORD NUMBER    Lab Results  Component Value Date   HGBA1C 7.4 (H) 06/26/2019     CURRENT MEDICATIONS: No current outpatient medications on file. (Ophthalmic Drugs)   No current facility-administered medications for this visit. (Ophthalmic Drugs)   Current Outpatient Medications (Other)  Medication Sig  . acetaminophen (TYLENOL) 500 MG tablet Take 1,000 mg by mouth every 6 (six) hours as needed (pain).  Marland Kitchen amiodarone (PACERONE) 200 MG tablet Take 1 Tablet Daily Monday -Friday and Take 1 Tablet Two Times Daily on Saturday and Sunday  . azelastine (ASTELIN) 0.1 % nasal spray Place 1 spray into both nostrils 2 (two) times daily.  . cephALEXin (KEFLEX) 500 MG capsule Take 1 capsule (500 mg total) by mouth 2 (two) times daily for 10 days.  . cholecalciferol (VITAMIN D) 1000 units tablet Take 1,000 Units by mouth daily.   Marland Kitchen ELIQUIS 5 MG TABS tablet TAKE ONE TABLET BY MOUTH TWICE DAILY (Patient taking differently: Take 5 mg by mouth 2 (two) times daily. )  . furosemide (LASIX) 20 MG tablet Take 1 tablet (20 mg total) by mouth daily.  Marland Kitchen gabapentin (NEURONTIN) 300 MG capsule Take 1  capsule (300 mg total) by mouth 2 (two) times daily.  Marland Kitchen glucose blood test strip 1 strip by Does not apply route 3 (three) times daily. Uses true metrix strips (diabetic club)  . insulin detemir (LEVEMIR) 100 UNIT/ML injection Inject 0.47-0.48 mLs (47-48 Units total) into the skin 2 (two) times daily. (Patient taking differently: Inject 38-40 Units into the skin 2 (two) times daily. )  . levothyroxine (SYNTHROID) 175 MCG tablet Take 1 tablet (175 mcg total) by mouth daily before breakfast. (Needs labwork)  . lisinopril (ZESTRIL) 5 MG tablet Take 5 mg by mouth daily.  . metFORMIN (GLUCOPHAGE) 1000 MG tablet TAKE 1 TABLET BY MOUTH TWICE DAILY WITH A MEAL. (Patient taking differently: Take 1,000 mg by mouth in the morning and at bedtime. )  . Multiple Vitamins-Minerals (MENS MULTI VITAMIN & MINERAL PO) Take 1 tablet by mouth daily.    . Omega-3 Fatty Acids (FISH OIL) 1200 MG CAPS Take 1,200-2,400 mg by mouth 2 (two) times daily. Take 1 capsule (1200 mg) by mouth daily at noon and 2 capsules (2400 mg) daily at bedtime  . phenazopyridine (PYRIDIUM) 200 MG tablet Take 1 tablet (200 mg total) by mouth 3 (three) times daily.  . Semaglutide, 1 MG/DOSE, (OZEMPIC, 1 MG/DOSE,) 2  MG/1.5ML SOPN Inject 1 mg into the skin once a week.  . simvastatin (ZOCOR) 80 MG tablet Take 0.5 tablets (40 mg total) by mouth at bedtime.  . sulfamethoxazole-trimethoprim (BACTRIM DS) 800-160 MG tablet Take 1 tablet by mouth 2 (two) times daily for 3 days.  . tamsulosin (FLOMAX) 0.4 MG CAPS capsule TAKE TWO CAPSULES BY MOUTH AT BEDTIME  . traMADol (ULTRAM) 50 MG tablet Take 50 mg by mouth 2 (two) times daily.  Marland Kitchen triamcinolone cream (KENALOG) 0.1 % Apply topically 2 (two) times daily.   No current facility-administered medications for this visit. (Other)      REVIEW OF SYSTEMS:    ALLERGIES Allergies  Allergen Reactions  . Phenothiazines Anaphylaxis  . Actos [Pioglitazone] Swelling    PAST MEDICAL HISTORY Past Medical  History:  Diagnosis Date  . Arthritis   . Atrial fibrillation ([place])   . BPH (benign prostatic hypertrophy) 04/15/2010  . Cataract   . Colon polyps   . DEPRESSION 01/22/2009  . Heart valve replaced   . HYPERCHOLESTEROLEMIA 01/22/2009  . HYPERTENSION 01/22/2009   Dr. [name]  . 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 Morbid obesity ([place])   . Tremors of nervous system    ?ptsd  . Ulcer    Past Surgical History:  Procedure Laterality Date  . 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 APPENDECTOMY    . bilateral cataract surg    . COLONOSCOPY  04/24/2007   [name]: normal  . COLONOSCOPY WITH ESOPHAGOGASTRODUODENOSCOPY (EGD) N/A 04/05/2012   Procedure: COLONOSCOPY WITH ESOPHAGOGASTRODUODENOSCOPY (EGD);  Surgeon: Daneil Dolin, MD;  [facility];  Service: Endoscopy;  Laterality: N/A;  10;15  . DENTAL SURGERY  05/2004   Dental extractions  . EYE SURGERY Bilateral    cataract  . HEEL SPUR SURGERY Bilateral    resection of heel spur  . KNEE ARTHROSCOPY WITH LATERAL MENISECTOMY Right 04/04/2014   Procedure: KNEE ARTHROSCOPY WITH LATERAL MENISECTOMY;  Surgeon: Carole Civil, MD;  Location[facility];  Service: Orthopedics;  Laterality: Right;  . PACEMAKER IMPLANT N/A 06/13/2016   Procedure: Pacemaker Implant- Dual Chamber;  Surgeon: Evans Lance, MD;  Location: [facility];  Service: Cardiovascular;  Laterality: N/A;  . PACEMAKER INSERTION  2018  . THYROIDECTOMY  03/21/2013   DR [name]  . THYROIDECTOMY N/A 03/21/2013   Procedure: THYROIDECTOMY;  Surgeon: [name], MD;  Location: [facility];  Service: General;  Laterality: N/A;  . TONSILLECTOMY      FAMILY HISTORY Family History  Problem Relation Age of Onset  . Heart disease Father   . Congestive Heart Failure Father   . Arthritis Father   . Diabetes Other   . Benign prostatic hyperplasia Brother     SOCIAL HISTORY Social History   Tobacco Use   . Smoking status: Former Smoker    Packs/day: 0.00    Years: 12.00    Pack years: 0.00    Types: Cigarettes    Quit date: 09/20/1961    Years since quitting: 57.9  . Smokeless tobacco: Never Used  Vaping Use  . Vaping Use: Never used  Substance Use Topics  . Alcohol use: Not Currently  . Drug use: No         OPHTHALMIC EXAM:  Base Eye Exam    Visual Acuity (ETDRS)      Right Left   Dist cc 20/20 20/25 +1   Correction: Glasses  Tonometry (Tonopen, 1:16 PM)      Right Left   Pressure 12 13       Pupils      Pupils Dark Light Shape React APD   Right PERRL 4 3 Round Brisk None   Left PERRL 4 3 Round Brisk None       Visual Fields (Counting fingers)      Left Right    Full Full       Extraocular Movement      Right Left    Full Full       Neuro/Psych    Oriented x3: Yes   Mood/Affect: Normal       Dilation    Both eyes: 1.0% Mydriacyl, 2.5% Phenylephrine @ 1:16 PM        Slit Lamp and Fundus Exam    External Exam      Right Left   External Normal Normal       Slit Lamp Exam      Right Left   Lids/Lashes Normal Normal   Conjunctiva/Sclera White and quiet White and quiet   Cornea Clear Clear   Anterior Chamber Deep and quiet Deep and quiet   Iris Round and reactive Round and reactive   Lens Centered posterior chamber intraocular lens Centered posterior chamber intraocular lens   Anterior Vitreous Normal Normal       Fundus Exam      Right Left   Posterior Vitreous Posterior vitreous detachment Posterior vitreous detachment   Disc Normal Normal   C/D Ratio 0.1 0.1   Macula Normal Normal   Vessels NPDR-Severe NPDR-Severe   Periphery moderate scatter pattern prp  moderate scatter pattern prp           IMAGING AND PROCEDURES  Imaging and Procedures for 08/27/19  OCT, Retina - OU - Both Eyes       Right Eye Quality was good. Scan locations included subfoveal. Central Foveal Thickness: 301. Progression has been stable. Findings  include normal foveal contour.   Left Eye Central Foveal Thickness: 292. Progression has been stable. Findings include normal foveal contour.   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 Vitreous hemorrhage of left eye (HCC)  H43.12   4. Posterior vitreous detachment of right eye  H43.811   5. Severe nonproliferative diabetic retinopathy of right eye without macular edema associated with type 2 diabetes mellitus ([place])  E11.3491     1.  No active progression of PDR OS.  Previous vitreous hemorrhage in the left eye has cleared nicely.  Good PRP peripherally OS.  2.  Severe NPDR OD protected by moderate scatter PRP, no progression to date.  3.  Ophthalmic Meds Ordered this visit:  No orders of the defined types were placed in this encounter.      Return in  about 6 months (around 02/27/2020) for DILATE OU, OCT, COLOR FP.  There are no Patient Instructions on file for this visit.   Explained the diagnoses, plan, and follow up with the patient and they expressed understanding.  Patient expressed understanding of the importance of proper follow up care.   Clent Demark Arika Mainer M.D. Diseases & Surgery of the Retina and Vitreous [facility] [place] 08/27/19     Abbreviations: M myopia (nearsighted); A astigmatism; H hyperopia (farsighted); P presbyopia; Mrx spectacle  prescription;  CTL contact lenses; OD right eye; OS left eye; OU both eyes  XT exotropia; ET esotropia; PEK punctate epithelial keratitis; PEE punctate epithelial erosions; DES dry eye syndrome; MGD meibomian gland dysfunction; ATs artificial tears; PFAT's preservative free artificial tears; [place] nuclear sclerotic cataract; PSC posterior subcapsular cataract; ERM epi-retinal membrane; PVD posterior vitreous detachment; RD retinal detachment; DM diabetes mellitus; DR diabetic retinopathy; NPDR non-proliferative diabetic retinopathy; PDR proliferative diabetic retinopathy; CSME clinically significant macular edema; DME diabetic macular edema; dbh dot blot hemorrhages; CWS cotton wool spot; POAG primary open angle glaucoma; C/D cup-to-disc ratio; HVF humphrey visual field; GVF goldmann visual field; OCT optical coherence tomography; IOP intraocular pressure; BRVO Branch retinal vein occlusion; CRVO central retinal vein occlusion; CRAO central retinal artery occlusion; BRAO branch retinal artery occlusion; RT retinal tear; SB scleral buckle; PPV pars plana vitrectomy; VH Vitreous hemorrhage; PRP panretinal laser photocoagulation; IVK intravitreal kenalog; VMT vitreomacular traction; MH Macular hole;  NVD neovascularization of the disc; NVE neovascularization elsewhere; AREDS age related eye disease study; ARMD age related macular degeneration; POAG primary open angle glaucoma; EBMD epithelial/anterior basement membrane dystrophy; ACIOL anterior chamber intraocular lens; IOL intraocular lens; PCIOL posterior chamber intraocular lens; Phaco/IOL phacoemulsification with intraocular lens placement; [place] photorefractive keratectomy; LASIK laser assisted in situ keratomileusis; HTN hypertension; DM diabetes mellitus; COPD chronic obstructive pulmonary disease

## 2019-08-28 ENCOUNTER — Encounter (INDEPENDENT_AMBULATORY_CARE_PROVIDER_SITE_OTHER): Payer: Medicare Other | Admitting: Ophthalmology

## 2019-09-09 ENCOUNTER — Ambulatory Visit: Payer: Medicare Other | Admitting: Family Medicine

## 2019-09-10 DIAGNOSIS — M169 Osteoarthritis of hip, unspecified: Secondary | ICD-10-CM | POA: Diagnosis not present

## 2019-09-10 DIAGNOSIS — M545 Low back pain: Secondary | ICD-10-CM | POA: Diagnosis not present

## 2019-09-11 ENCOUNTER — Encounter: Payer: Self-pay | Admitting: Family Medicine

## 2019-09-14 DIAGNOSIS — R8290 Unspecified abnormal findings in urine: Secondary | ICD-10-CM | POA: Diagnosis not present

## 2019-09-14 DIAGNOSIS — N3001 Acute cystitis with hematuria: Secondary | ICD-10-CM | POA: Diagnosis not present

## 2019-09-16 DIAGNOSIS — M25532 Pain in left wrist: Secondary | ICD-10-CM | POA: Diagnosis not present

## 2019-09-16 DIAGNOSIS — S12601A Unspecified nondisplaced fracture of seventh cervical vertebra, initial encounter for closed fracture: Secondary | ICD-10-CM | POA: Diagnosis not present

## 2019-09-16 DIAGNOSIS — S0990XA Unspecified injury of head, initial encounter: Secondary | ICD-10-CM | POA: Diagnosis not present

## 2019-09-16 DIAGNOSIS — W19XXXA Unspecified fall, initial encounter: Secondary | ICD-10-CM | POA: Diagnosis not present

## 2019-09-16 DIAGNOSIS — R55 Syncope and collapse: Secondary | ICD-10-CM | POA: Insufficient documentation

## 2019-09-16 DIAGNOSIS — I6523 Occlusion and stenosis of bilateral carotid arteries: Secondary | ICD-10-CM | POA: Diagnosis not present

## 2019-09-16 DIAGNOSIS — R404 Transient alteration of awareness: Secondary | ICD-10-CM | POA: Diagnosis not present

## 2019-09-16 DIAGNOSIS — S12600A Unspecified displaced fracture of seventh cervical vertebra, initial encounter for closed fracture: Secondary | ICD-10-CM | POA: Diagnosis not present

## 2019-09-16 DIAGNOSIS — I739 Peripheral vascular disease, unspecified: Secondary | ICD-10-CM | POA: Diagnosis not present

## 2019-09-16 DIAGNOSIS — J3489 Other specified disorders of nose and nasal sinuses: Secondary | ICD-10-CM | POA: Diagnosis not present

## 2019-09-16 DIAGNOSIS — Z743 Need for continuous supervision: Secondary | ICD-10-CM | POA: Diagnosis not present

## 2019-09-17 DIAGNOSIS — R55 Syncope and collapse: Secondary | ICD-10-CM | POA: Diagnosis not present

## 2019-09-17 DIAGNOSIS — E860 Dehydration: Secondary | ICD-10-CM | POA: Diagnosis not present

## 2019-09-17 DIAGNOSIS — I4891 Unspecified atrial fibrillation: Secondary | ICD-10-CM | POA: Diagnosis not present

## 2019-09-18 DIAGNOSIS — R55 Syncope and collapse: Secondary | ICD-10-CM | POA: Diagnosis not present

## 2019-09-18 DIAGNOSIS — N1832 Chronic kidney disease, stage 3b: Secondary | ICD-10-CM

## 2019-09-18 HISTORY — DX: Chronic kidney disease, stage 3b: N18.32

## 2019-09-19 ENCOUNTER — Telehealth: Payer: Self-pay

## 2019-09-19 NOTE — Telephone Encounter (Signed)
TRANSITIONAL CARE MANAGEMENT TELEPHONE OUTREACH NOTE   Contact Date: 09/19/2019 Contacted By:  Felicity Coyer  DISCHARGE INFORMATION Date of Discharge:09/18/2019 Discharge Facility: [facility] Discharge Diagnosis:  Fall, closed nondisplaced fracture of seventh cervical vertabra  Outpatient Follow Up Recommendations  follow-up visit with specialist physician  Needs referral to neurosurgery for cervical spine fracture     [name] is a male primary care patient of Dettinger, Fransisca Kaufmann, MD. An outgoing telephone call was made today and I spoke with the patient.  [name] condition(s) and treatment(s) were discussed. An opportunity to ask questions was provided and all were answered or forwarded as appropriate.    ACTIVITIES OF DAILY LIVING  [name] lives alone and he can perform ADLs independently. his primary caregiver is himself. he is able to depend on his primary caregiver(s) for consistent help. Transportation to appointments, to pick up medications, and to run errands is not a problem.  (Consider referral to [facility] if transportation or a consistent caregiver is a problem)   Fall Risk Fall Risk  06/26/2019 04/30/2019  Falls in the past year? 0 0  Number falls in past yr: - -  Injury with Fall? - -  Risk for fall due to : - -  Follow up - -    medium [place] Modifications/Assistive Devices Wheelchair: No Cane: No Ramp: No Bedside Toilet: No Hospital Bed:  No    [place] he is not receiving home health Nursing and Physical therapy services.     MEDICATION RECONCILIATION  [name] has been able to pick-up all prescribed discharge medications from the pharmacy.   A post discharge medication reconciliation was performed and the complete medication list was reviewed with the patient/caregiver and is current as of 09/19/2019. Changes highlighted below.  Discontinued Medications None  Current Medication  List Allergies as of 09/19/2019      Reactions   Phenothiazines Anaphylaxis   Actos [pioglitazone] Swelling      Medication List       Accurate as of September 19, 2019  9:08 AM. If you have any questions, ask your nurse or doctor.        acetaminophen 500 MG tablet Commonly known as: TYLENOL Take 1,000 mg by mouth every 6 (six) hours as needed (pain).   amiodarone 200 MG tablet Commonly known as: PACERONE Take 1 Tablet Daily Monday -Friday and Take 1 Tablet Two Times Daily on Saturday and Sunday   azelastine 0.1 % nasal spray Commonly known as: ASTELIN Place 1 spray into both nostrils 2 (two) times daily.   cholecalciferol 1000 units tablet Commonly known as: VITAMIN D Take 1,000 Units by mouth daily.   Eliquis 5 MG Tabs tablet Generic drug: apixaban TAKE ONE TABLET BY MOUTH TWICE DAILY What changed: how much to take   Fish Oil 1200 MG Caps Take 1,200-2,400 mg by mouth 2 (two) times daily. Take 1 capsule (1200 mg) by mouth daily at noon and 2 capsules (2400 mg) daily at bedtime   furosemide 20 MG tablet Commonly known as: LASIX Take 1 tablet (20 mg total) by mouth daily.   gabapentin 300 MG capsule Commonly known as: NEURONTIN Take 1 capsule (300 mg total) by mouth 2 (two) times daily.   glucose blood test strip 1 strip by Does not apply route 3 (three) times daily. Uses true metrix strips (diabetic club)   insulin detemir 100 UNIT/ML injection Commonly known as: LEVEMIR Inject  0.47-0.48 mLs (47-48 Units total) into the skin 2 (two) times daily. What changed: how much to take   levothyroxine 175 MCG tablet Commonly known as: SYNTHROID Take 1 tablet (175 mcg total) by mouth daily before breakfast. (Needs labwork)   lisinopril 5 MG tablet Commonly known as: ZESTRIL Take 5 mg by mouth daily.   MENS MULTI VITAMIN & MINERAL PO Take 1 tablet by mouth daily.   metFORMIN 1000 MG tablet Commonly known as: GLUCOPHAGE TAKE 1 TABLET BY MOUTH TWICE DAILY WITH A  MEAL. What changed: See the new instructions.   Ozempic (1 MG/DOSE) 2 MG/1.5ML Sopn Generic drug: Semaglutide (1 MG/DOSE) Inject 1 mg into the skin once a week.   phenazopyridine 200 MG tablet Commonly known as: PYRIDIUM Take 1 tablet (200 mg total) by mouth 3 (three) times daily.   simvastatin 80 MG tablet Commonly known as: ZOCOR Take 0.5 tablets (40 mg total) by mouth at bedtime.   tamsulosin 0.4 MG Caps capsule Commonly known as: FLOMAX TAKE TWO CAPSULES BY MOUTH AT BEDTIME   traMADol 50 MG tablet Commonly known as: ULTRAM Take 50 mg by mouth 2 (two) times daily.   triamcinolone cream 0.1 % Commonly known as: KENALOG Apply topically 2 (two) times daily.        PATIENT EDUCATION & FOLLOW-UP PLAN  An appointment for Transitional Care Management is scheduled with Evelina Dun, NP, on 09/23/2019 at 10:25 am.  Take all medications as prescribed  Contact our office by calling [phone] if you have any questions or concerns

## 2019-09-23 ENCOUNTER — Encounter: Payer: Self-pay | Admitting: Family

## 2019-09-23 ENCOUNTER — Ambulatory Visit (INDEPENDENT_AMBULATORY_CARE_PROVIDER_SITE_OTHER): Payer: Medicare Other | Admitting: Family

## 2019-09-23 ENCOUNTER — Other Ambulatory Visit: Payer: Self-pay

## 2019-09-23 VITALS — BP 125/58 | HR 66 | Temp 97.0°F | Ht 73.0 in | Wt 260.2 lb

## 2019-09-23 DIAGNOSIS — R55 Syncope and collapse: Secondary | ICD-10-CM

## 2019-09-23 DIAGNOSIS — M542 Cervicalgia: Secondary | ICD-10-CM

## 2019-09-23 DIAGNOSIS — Z09 Encounter for follow-up examination after completed treatment for conditions other than malignant neoplasm: Secondary | ICD-10-CM | POA: Diagnosis not present

## 2019-09-23 DIAGNOSIS — S12601D Unspecified nondisplaced fracture of seventh cervical vertebra, subsequent encounter for fracture with routine healing: Secondary | ICD-10-CM | POA: Diagnosis not present

## 2019-09-23 NOTE — Patient Instructions (Signed)
Cervical Spine Fracture, Stable  A cervical spine fracture is a break or crack in one of the bones of the neck. If there is a very low risk of problems happening during healing, the fracture is considered stable. What are the causes? This condition may be caused by:  Motor vehicle accidents.  Injuries from sports such as diving, football, biking, wrestling, or skiing.  Severe osteoporosis or other bone diseases, such as cancers that spread to bone or metabolic abnormalities that cause bone weakness. What are the signs or symptoms? Symptoms of this condition include:  Severe neck pain after an accident or fall. Pain may spread down the shoulders or arms.  Bruising or swelling on the back of the neck.  Numbness, tingling, sudden muscle tightening (spasms), or weakness in the arms, legs, or both. How is this diagnosed? This condition may be diagnosed based on:  Your medical history.  A physical exam of your neck, arms, and legs.  Imaging studies of the neck, such as: ? X-rays. ? CT scan. ? MRI. How is this treated? This condition is treated with a neck brace or cervical collar to keep your neck from moving during the healing process. A cervical collar is a device that supports your chin and the back of your head. You may also be given medicine to help relieve pain. Follow these instructions at home: If you have a neck brace:  Wear the brace as told by your health care provider. Remove it only as told by your health care provider.  If you experience numbness or tingling, loosen the brace.  Keep the brace clean.  If the brace is not waterproof: ? Do not let it get wet. ? Cover it with a watertight covering when you take a bath or a shower. If you have a cervical collar:  Do not remove the collar unless your health care provider tells you to do this. If you are allowed to remove the collar for cleaning and bathing: ? Follow your health care provider's instructions about how  to safely take off the collar. ? Wash and thoroughly dry the skin on your neck. Check your skin for irritation or sores. If you see any, tell your health care provider.  Ask your health care provider before making any adjustments to your collar. Small adjustments may be needed over time to improve comfort and reduce pressure on your chin or on the back of your head.  Keep long hair outside of the collar.  Keep your collar clean by wiping it with mild soap and water and letting it air-dry completely. The pads can be hand-washed with soap and water and air-dried completely. Managing pain, stiffness, and swelling   If directed, put ice on the injured area: ? If you have a removable brace or cervical collar, remove it as told by your health care provider. ? Put ice in a plastic bag. ? Place a towel between your skin and the bag. ? Leave the ice on for 20 minutes, 2-3 times a day. Activity  Do not drive a car until your health care provider approves.  Do not drive or use heavy machinery while taking prescription pain medicine.  Avoid physical activity for as long as directed. Ask your health care provider what activities are safe for you. General instructions  Take over-the-counter and prescription medicines only as told by your health care provider.  Do not take baths, swim, or use a hot tub until your health care provider approves. Ask your  health care provider if you can take showers. You may only be allowed to take sponge baths for bathing.  Do not use any products that contain nicotine or tobacco, such as cigarettes and e-cigarettes. These can delay bone healing. If you need help quitting, ask your health care provider.  Keep all follow-up visits as told by your health care provider. This is important to help prevent long-term (chronic) or permanent injury, pain, and disability. You may need to have follow-up X-rays or MRI 1-3 weeks after your injury. Contact a health care provider  if:  You have irritation or sores on your skin from your brace or cervical collar. Get help right away if:  You have neck pain that gets worse.  You develop difficulties swallowing or breathing.  You develop swelling in your neck.  You have any of the following problems in your arms, legs, or both: ? Numbness. ? Weakness. ? Burning pain. ? Movement problems.  You are unable to control when you urinate or have a bowel movement (incontinence).  You have problems with coordination or difficulty walking. This information is not intended to replace advice given to you by your health care provider. Make sure you discuss any questions you have with your health care provider. Document Revised: 12/23/2016 Document Reviewed: 10/15/2015 Elsevier Patient Education  2020 [employer].

## 2019-09-23 NOTE — Progress Notes (Signed)
Subjective:    Patient ID: ARVINE CLAYBURN, male    DOB: [birth year], 74 y.o.   MRN: [id number]  Chief Complaint  Patient presents with  . Hospitalization Follow-up    fracture neck bone pt wants xray    HPI Today's visit was for Transitional Care Management.  The patient was discharged from [facility] on 09/18/19 with a primary diagnosis of Fall and closed nondisplaced fracture of seventh cervical vertebra.    Contact with the patient and/or caregiver, by a clinical staff member, was made on 09/19/19 and was documented as a telephone encounter within the EMR.  Through chart review and discussion with the patient I have determined that management of their condition is of moderate complexity.   He went to the ED on 09/16/19 after he had a syncope episode and his his head. He sustained a neck injury with a C7 cervical spinal fracture from the fall. He was stabilized with a C collar.   He reports his pain is 3 out 10 in left neck. He currently does not have an appointment with Neurosurgeon. He needs a referral today.     Review of Systems  All other systems reviewed and are negative.      Objective:   Physical Exam Vitals reviewed.  Constitutional:      General: He is not in acute distress.    Appearance: He is well-developed. He is obese.  HENT:     Head: Normocephalic.  Eyes:     General:        Right eye: No discharge.        Left eye: No discharge.     Pupils: Pupils are equal, round, and reactive to light.  Neck:     Thyroid: No thyromegaly.  Cardiovascular:     Rate and Rhythm: Normal rate and regular rhythm.     Heart sounds: Normal heart sounds. No murmur heard.   Pulmonary:     Effort: Pulmonary effort is normal. No respiratory distress.     Breath sounds: Normal breath sounds. No wheezing.  Abdominal:     General: Bowel sounds are normal. There is no distension.     Palpations: Abdomen is soft.     Tenderness: There is no abdominal tenderness.   Musculoskeletal:        General: No tenderness.     Cervical back: Normal range of motion and neck supple.     Comments: Pt has C collar on  Skin:    General: Skin is warm and dry.     Findings: No erythema or rash.  Neurological:     Mental Status: He is alert and oriented to person, place, and time.     Cranial Nerves: No cranial nerve deficit.     Deep Tendon Reflexes: Reflexes are normal and symmetric.  Psychiatric:        Behavior: Behavior normal.        Thought Content: Thought content normal.        Judgment: Judgment normal.       BP (!) 125/58   Pulse 66   Temp (!) 97 F (36.1 C) (Temporal)   Ht 6\' 1"  (1.854 m)   Wt 260 lb 3.2 oz (118 kg)   SpO2 97%   BMI 34.33 kg/m      Assessment & Plan:  MALEKE FERIA comes in today with chief complaint of Hospitalization Follow-up (fracture neck bone pt wants xray)   Diagnosis and orders addressed:  1.  Hospital discharge follow-up  2. Neck pain - Ambulatory referral to Neurosurgery  3. Closed nondisplaced fracture of seventh cervical vertebra with routine healing, unspecified fracture morphology, subsequent encounter - Ambulatory referral to Neurosurgery  4. Syncope, unspecified syncope type - Ambulatory referral to Neurosurgery  Keep follow up with PCP Referral to Neurosurgeon pending Continue to wear C collar   Evelina Dun, FNP

## 2019-10-02 ENCOUNTER — Encounter: Payer: Self-pay | Admitting: Family Medicine

## 2019-10-02 ENCOUNTER — Other Ambulatory Visit: Payer: Self-pay

## 2019-10-02 ENCOUNTER — Ambulatory Visit (INDEPENDENT_AMBULATORY_CARE_PROVIDER_SITE_OTHER): Payer: Medicare Other | Admitting: Family Medicine

## 2019-10-02 ENCOUNTER — Ambulatory Visit (INDEPENDENT_AMBULATORY_CARE_PROVIDER_SITE_OTHER): Payer: Medicare Other

## 2019-10-02 VITALS — BP 131/61 | HR 63 | Temp 98.2°F | Ht 73.0 in | Wt 260.0 lb

## 2019-10-02 DIAGNOSIS — R29898 Other symptoms and signs involving the musculoskeletal system: Secondary | ICD-10-CM | POA: Diagnosis not present

## 2019-10-02 DIAGNOSIS — Z794 Long term (current) use of insulin: Secondary | ICD-10-CM | POA: Diagnosis not present

## 2019-10-02 DIAGNOSIS — R531 Weakness: Secondary | ICD-10-CM | POA: Diagnosis not present

## 2019-10-02 DIAGNOSIS — E1140 Type 2 diabetes mellitus with diabetic neuropathy, unspecified: Secondary | ICD-10-CM | POA: Diagnosis not present

## 2019-10-02 DIAGNOSIS — M47816 Spondylosis without myelopathy or radiculopathy, lumbar region: Secondary | ICD-10-CM | POA: Diagnosis not present

## 2019-10-02 LAB — MICROSCOPIC EXAMINATION: RBC, Urine: NONE SEEN /hpf (ref 0–2)

## 2019-10-02 LAB — URINALYSIS, COMPLETE
Bilirubin, UA: NEGATIVE
Ketones, UA: NEGATIVE
Nitrite, UA: POSITIVE — AB
Protein,UA: NEGATIVE
Specific Gravity, UA: 1.015 (ref 1.005–1.030)
Urobilinogen, Ur: 0.2 mg/dL (ref 0.2–1.0)
pH, UA: 5.5 (ref 5.0–7.5)

## 2019-10-02 LAB — BAYER DCA HB A1C WAIVED: HB A1C (BAYER DCA - WAIVED): 7.3 % — ABNORMAL HIGH (ref <7.0)

## 2019-10-02 NOTE — Progress Notes (Signed)
BP 131/61   Pulse 63   Temp 98.2 F (36.8 C)   Ht 6' 1" (1.854 m)   Wt 260 lb (117.9 kg)   SpO2 98%   BMI 34.30 kg/m    Subjective:   Patient ID: [name], male    DOB: [birth year], 74 y.o.   MRN: [id number]  HPI: [name] is a 74 y.o. male presenting on 10/02/2019 for Medical Management of Chronic Issues, Back Pain (lower back), Dizziness, and Anxiety   HPI Patient is coming in complaining of shakiness and lower back pain and weakness in his lower legs and says that anytime he tries to stand up he just feels like his legs are so shaky and moving and he cannot sit up. When directly asked if he is feeling anxious or has any anxiety he says no. He just says his back pain has been bothering him and that is just been so shaky and feeling weak. He says is been going on for 2 weeks, he was treated initially for UTI 2 weeks ago and is just finishing the antibiotic. He also complains of both hips hurting and having previous imaging that shows arthritis but his biggest complaint is he just feels weak in his lower legs and feels very shaky.  Relevant past medical, surgical, family and social history reviewed and updated as indicated. Interim medical history since our last visit reviewed. Allergies and medications reviewed and updated.  Review of Systems  Constitutional: Negative for chills and fever.  Respiratory: Negative for shortness of breath and wheezing.   Cardiovascular: Negative for chest pain and leg swelling.  Musculoskeletal: Positive for back pain. Negative for gait problem.  Skin: Negative for rash.  Neurological: Positive for weakness and numbness. Negative for dizziness.  All other systems reviewed and are negative.   Per HPI unless specifically indicated above   Allergies as of 10/02/2019      Reactions   Phenothiazines Anaphylaxis   Actos [pioglitazone] Swelling      Medication List       Accurate as of October 02, 2019 11:20 AM. If you have any  questions, ask your nurse or doctor.        STOP taking these medications   Fish Oil 1200 MG Caps Stopped by: Fransisca Kaufmann Elvina Bosch, MD     TAKE these medications   acetaminophen 500 MG tablet Commonly known as: TYLENOL Take 1,000 mg by mouth every 6 (six) hours as needed (pain).   amiodarone 200 MG tablet Commonly known as: PACERONE Take 1 Tablet Daily Monday -Friday and Take 1 Tablet Two Times Daily on Saturday and Sunday   azelastine 0.1 % nasal spray Commonly known as: ASTELIN Place 1 spray into both nostrils 2 (two) times daily.   cholecalciferol 1000 units tablet Commonly known as: VITAMIN D Take 1,000 Units by mouth daily.   Eliquis 5 MG Tabs tablet Generic drug: apixaban TAKE ONE TABLET BY MOUTH TWICE DAILY What changed: how much to take   furosemide 20 MG tablet Commonly known as: LASIX Take 1 tablet (20 mg total) by mouth daily.   gabapentin 300 MG capsule Commonly known as: NEURONTIN Take 1 capsule (300 mg total) by mouth 2 (two) times daily.   glucose blood test strip 1 strip by Does not apply route 3 (three) times daily. Uses true metrix strips (diabetic club)   insulin detemir 100 UNIT/ML injection Commonly known as: LEVEMIR Inject 0.47-0.48 mLs (47-48 Units total) into the skin 2 (two)  times daily. What changed: how much to take   levothyroxine 175 MCG tablet Commonly known as: SYNTHROID Take 1 tablet (175 mcg total) by mouth daily before breakfast. (Needs labwork)   lisinopril 5 MG tablet Commonly known as: ZESTRIL Take 5 mg by mouth daily.   MENS MULTI VITAMIN & MINERAL PO Take 1 tablet by mouth daily.   metFORMIN 1000 MG tablet Commonly known as: GLUCOPHAGE TAKE 1 TABLET BY MOUTH TWICE DAILY WITH A MEAL. What changed: See the new instructions.   Ozempic (1 MG/DOSE) 2 MG/1.5ML Sopn Generic drug: Semaglutide (1 MG/DOSE) Inject 1 mg into the skin once a week.   phenazopyridine 200 MG tablet Commonly known as: PYRIDIUM Take 1 tablet (200  mg total) by mouth 3 (three) times daily.   simvastatin 80 MG tablet Commonly known as: ZOCOR Take 0.5 tablets (40 mg total) by mouth at bedtime.   tamsulosin 0.4 MG Caps capsule Commonly known as: FLOMAX TAKE TWO CAPSULES BY MOUTH AT BEDTIME   tetracycline 250 MG capsule Commonly known as: SUMYCIN   traMADol 50 MG tablet Commonly known as: ULTRAM Take 50 mg by mouth 2 (two) times daily.   triamcinolone cream 0.1 % Commonly known as: KENALOG Apply topically 2 (two) times daily.        Objective:   BP 131/61   Pulse 63   Temp 98.2 F (36.8 C)   Ht 6' 1" (1.854 m)   Wt 260 lb (117.9 kg)   SpO2 98%   BMI 34.30 kg/m   Wt Readings from Last 3 Encounters:  10/02/19 260 lb (117.9 kg)  09/23/19 260 lb 3.2 oz (118 kg)  08/07/19 261 lb (118.4 kg)    Physical Exam Vitals and nursing note reviewed.  Constitutional:      General: He is not in acute distress.    Appearance: He is well-developed. He is not diaphoretic.  Eyes:     General: No scleral icterus.    Conjunctiva/sclera: Conjunctivae normal.  Neck:     Thyroid: No thyromegaly.  Cardiovascular:     Rate and Rhythm: Normal rate and regular rhythm.     Heart sounds: Normal heart sounds. No murmur heard.   Pulmonary:     Effort: Pulmonary effort is normal. No respiratory distress.     Breath sounds: Normal breath sounds. No wheezing.  Musculoskeletal:        General: Normal range of motion.     Cervical back: Neck supple.     Lumbar back: Tenderness and bony tenderness present. Negative right straight leg raise test and negative left straight leg raise test.  Lymphadenopathy:     Cervical: No cervical adenopathy.  Skin:    General: Skin is warm and dry.     Findings: No rash.  Neurological:     Mental Status: He is alert and oriented to person, place, and time.     Coordination: Coordination normal.  Psychiatric:        Behavior: Behavior normal.       Assessment & Plan:   Problem List Items  Addressed This Visit      Endocrine   Type 2 diabetes mellitus with diabetic neuropathy, unspecified ([place])   Relevant Orders   Bayer DCA Hb A1c Waived   BMP8+EGFR    Other Visit Diagnoses    Weakness of both lower extremities    -  Primary   Relevant Orders   Urinalysis, Complete   Urine Culture   DG Lumbar Spine 2-3 Views  Ambulatory referral to Physical Therapy      Patient's weakness might be related to his spine or possibly decreased mobility through Covid or possibly still fighting urinary symptoms, we will have him do an x-ray and a urine prior to leaving and referred to physical therapy. Follow up plan: Return in about 4 weeks (around 10/30/2019), or if symptoms worsen or fail to improve, for Weakness and diabetes and thyroid recheck.  Counseling provided for all of the vaccine components Orders Placed This Encounter  Procedures  . Bayer [place] Hb A1c [place], MD [facility] 10/02/2019, 11:20 AM

## 2019-10-03 LAB — BMP8+EGFR
BUN/Creatinine Ratio: 14 (ref 10–24)
BUN: 23 mg/dL (ref 8–27)
CO2: 24 mmol/L (ref 20–29)
Calcium: 8.8 mg/dL (ref 8.6–10.2)
Chloride: 99 mmol/L (ref 96–106)
Creatinine, Ser: 1.7 mg/dL — ABNORMAL HIGH (ref 0.76–1.27)
GFR calc Af Amer: 44 mL/min/{1.73_m2} — ABNORMAL LOW (ref >59)
GFR calc non Af Amer: 38 mL/min/{1.73_m2} — ABNORMAL LOW (ref >59)
Glucose: 146 mg/dL — ABNORMAL HIGH (ref 65–99)
Potassium: 5.1 mmol/L (ref 3.5–5.2)
Sodium: 133 mmol/L — ABNORMAL LOW (ref 134–144)

## 2019-10-04 ENCOUNTER — Ambulatory Visit: Payer: Medicare Other | Admitting: Family Medicine

## 2019-10-04 ENCOUNTER — Other Ambulatory Visit: Payer: Self-pay

## 2019-10-04 ENCOUNTER — Other Ambulatory Visit (HOSPITAL_COMMUNITY)
Admission: RE | Admit: 2019-10-04 | Discharge: 2019-10-04 | Disposition: A | Discharge status: Home / Self Care | Payer: Medicare Other | Source: Ambulatory Visit | Attending: Family Medicine | Admitting: Family Medicine

## 2019-10-04 DIAGNOSIS — Z01812 Encounter for preprocedural laboratory examination: Secondary | ICD-10-CM | POA: Diagnosis not present

## 2019-10-04 DIAGNOSIS — Z20822 Contact with and (suspected) exposure to covid-19: Secondary | ICD-10-CM | POA: Diagnosis not present

## 2019-10-05 LAB — SARS CORONAVIRUS 2 (TAT 6-24 HRS): SARS Coronavirus 2: NEGATIVE

## 2019-10-07 ENCOUNTER — Other Ambulatory Visit: Payer: Self-pay

## 2019-10-07 ENCOUNTER — Ambulatory Visit (INDEPENDENT_AMBULATORY_CARE_PROVIDER_SITE_OTHER): Payer: Medicare Other | Admitting: Family Medicine

## 2019-10-07 ENCOUNTER — Ambulatory Visit (INDEPENDENT_AMBULATORY_CARE_PROVIDER_SITE_OTHER): Payer: Medicare Other | Admitting: *Deleted

## 2019-10-07 DIAGNOSIS — I480 Paroxysmal atrial fibrillation: Secondary | ICD-10-CM

## 2019-10-07 DIAGNOSIS — R399 Unspecified symptoms and signs involving the genitourinary system: Secondary | ICD-10-CM | POA: Diagnosis not present

## 2019-10-07 LAB — URINE CULTURE

## 2019-10-07 MED ORDER — CEFTRIAXONE SODIUM 1 G IJ SOLR
1.0000 g | Freq: Once | INTRAMUSCULAR | Status: AC
  Administered 2019-10-07: 1 g via INTRAMUSCULAR

## 2019-10-08 ENCOUNTER — Telehealth: Payer: Self-pay | Admitting: Family Medicine

## 2019-10-08 ENCOUNTER — Ambulatory Visit (HOSPITAL_COMMUNITY)
Admission: RE | Admit: 2019-10-08 | Discharge: 2019-10-08 | Disposition: A | Discharge status: Home / Self Care | Payer: Medicare Other | Source: Ambulatory Visit | Attending: Cardiology | Admitting: Cardiology

## 2019-10-08 DIAGNOSIS — R0600 Dyspnea, unspecified: Secondary | ICD-10-CM | POA: Diagnosis not present

## 2019-10-08 DIAGNOSIS — R0609 Other forms of dyspnea: Secondary | ICD-10-CM

## 2019-10-08 LAB — PULMONARY FUNCTION TEST
DL/VA % pred: 104 %
DL/VA: 4.06 ml/min/mmHg/L
DLCO unc % pred: 68 %
DLCO unc: 18.67 ml/min/mmHg
FEF 25-75 Post: 1.96 L/sec
FEF 25-75 Pre: 1.79 L/sec
FEF2575-%Change-Post: 9 %
FEF2575-%Pred-Post: 79 %
FEF2575-%Pred-Pre: 72 %
FEV1-%Change-Post: 5 %
FEV1-%Pred-Post: 63 %
FEV1-%Pred-Pre: 60 %
FEV1-Post: 2.17 L
FEV1-Pre: 2.06 L
FEV1FVC-%Change-Post: 0 %
FEV1FVC-%Pred-Pre: 102 %
FEV6-%Change-Post: 8 %
FEV6-%Pred-Post: 65 %
FEV6-%Pred-Pre: 60 %
FEV6-Post: 2.93 L
FEV6-Pre: 2.69 L
FEV6FVC-%Pred-Post: 106 %
FEV6FVC-%Pred-Pre: 106 %
FVC-%Change-Post: 6 %
FVC-%Pred-Post: 62 %
FVC-%Pred-Pre: 59 %
FVC-Post: 2.96 L
FVC-Pre: 2.78 L
Post FEV1/FVC ratio: 73 %
Post FEV6/FVC ratio: 100 %
Pre FEV1/FVC ratio: 74 %
Pre FEV6/FVC Ratio: 100 %
RV % pred: 122 %
RV: 3.35 L
TLC % pred: 80 %
TLC: 6.16 L

## 2019-10-08 MED ORDER — ALBUTEROL SULFATE (2.5 MG/3ML) 0.083% IN NEBU
2.5000 mg | INHALATION_SOLUTION | Freq: Once | RESPIRATORY_TRACT | Status: AC
  Administered 2019-10-08: 2.5 mg via RESPIRATORY_TRACT

## 2019-10-08 NOTE — Progress Notes (Deleted)
Cardiology Office Note   Date:  10/08/2019   ID:  [name], DOB 1945-12-01, MRN [id number]  PCP:  Dettinger, Fransisca Kaufmann, MD  Cardiologist:   No primary care provider on file.   No chief complaint on file.     History of Present Illness: [name] is a 74 y.o. male who for followup of aortic valve replacement, follow up of CAD, atrial fib and pacemaker placement for symptomatic bradycardia.   At the last visit he had increased dyspnea.  ***  Conclusions: The diffusion defect, increased FEV1/FVC ratio and reduced FVC suggest an early interstitial process. Anemia cannot be excluded as a potential cause of the diffusion defect without correcting the observed diffusing capacity for hemoglobin.   *** Since I last saw him he called with increased dyspnea.  He has had some increased shortness of breath he says for 3 or 4 months.  He will get short of breath carrying in groceries.  He is not describing chest pressure, neck or arm discomfort.  He is not describing PND or orthopnea.  He is limited by back pain.  He has had no new significant weight gain or swelling by his measure.  He is not feeling his heart racing or skipping.  He is not having any cough fevers or chills.    Past Medical History:  Diagnosis Date   Arthritis    Atrial fibrillation (HCC)    BPH (benign prostatic hypertrophy) 04/15/2010   Cataract    Colon polyps    DEPRESSION 01/22/2009   Heart valve replaced    HYPERCHOLESTEROLEMIA 01/22/2009   HYPERTENSION 01/22/2009   Dr. 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/22/2009   IDDM 01/22/2009   Morbid obesity ([place])    Tremors of nervous system    ?ptsd   Ulcer     Past Surgical History:  Procedure Laterality Date   AORTIC VALVE REPLACEMENT  July 2006   #20 stentless [place] porcine valve   APPENDECTOMY     bilateral cataract surg     COLONOSCOPY  04/24/2007   [name]: normal   COLONOSCOPY WITH ESOPHAGOGASTRODUODENOSCOPY  (EGD) N/A 04/05/2012   Procedure: COLONOSCOPY WITH ESOPHAGOGASTRODUODENOSCOPY (EGD);  Surgeon: Daneil Dolin, MD;  Location[facility];  Service: Endoscopy;  Laterality: N/A;  10;15   DENTAL SURGERY  05/2004   Dental extractions   EYE SURGERY Bilateral    cataract   HEEL SPUR SURGERY Bilateral    resection of heel spur   KNEE ARTHROSCOPY WITH LATERAL MENISECTOMY Right 04/04/2014   Procedure: KNEE ARTHROSCOPY WITH LATERAL MENISECTOMY;  Surgeon: Carole Civil, MD;  Location: [facility];  Service: Orthopedics;  Laterality: Right;   PACEMAKER IMPLANT N/A 06/13/2016   Procedure: Pacemaker Implant- Dual Chamber;  Surgeon: Evans Lance, MD;  Location: [facility];  Service: Cardiovascular;  Laterality: N/A;   PACEMAKER INSERTION  2018   THYROIDECTOMY  03/21/2013   DR [name]   THYROIDECTOMY N/A 03/21/2013   Procedure: THYROIDECTOMY;  Surgeon: [name], MD;  Location: [facility];  Service: General;  Laterality: N/A;   TONSILLECTOMY       Current Outpatient Medications  Medication Sig Dispense Refill   acetaminophen (TYLENOL) 500 MG tablet Take 1,000 mg by mouth every 6 (six) hours as needed (pain).     amiodarone (PACERONE) 200 MG tablet Take 1 Tablet Daily Monday -Friday and Take 1 Tablet Two Times Daily on Saturday and Sunday 90 tablet 3   azelastine (ASTELIN) 0.1 %  nasal spray Place 1 spray into both nostrils 2 (two) times daily. 30 mL 12   cholecalciferol (VITAMIN D) 1000 units tablet Take 1,000 Units by mouth daily.      ELIQUIS 5 MG TABS tablet TAKE ONE TABLET BY MOUTH TWICE DAILY (Patient taking differently: Take 5 mg by mouth 2 (two) times daily. ) 60 tablet 5   furosemide (LASIX) 20 MG tablet Take 1 tablet (20 mg total) by mouth daily. 30 tablet 1   gabapentin (NEURONTIN) 300 MG capsule Take 1 capsule (300 mg total) by mouth 2 (two) times daily. 180 capsule 3   glucose blood test strip 1 strip by Does not apply route 3 (three) times daily. Uses true metrix  strips (diabetic club)  12   insulin detemir (LEVEMIR) 100 UNIT/ML injection Inject 0.47-0.48 mLs (47-48 Units total) into the skin 2 (two) times daily. (Patient taking differently: Inject 38-40 Units into the skin 2 (two) times daily. ) 10 mL    levothyroxine (SYNTHROID) 175 MCG tablet Take 1 tablet (175 mcg total) by mouth daily before breakfast. (Needs labwork) 30 tablet 0   lisinopril (ZESTRIL) 5 MG tablet Take 5 mg by mouth daily.     metFORMIN (GLUCOPHAGE) 1000 MG tablet TAKE 1 TABLET BY MOUTH TWICE DAILY WITH A MEAL. (Patient taking differently: Take 1,000 mg by mouth in the morning and at bedtime. ) 180 tablet 0   Multiple Vitamins-Minerals (MENS MULTI VITAMIN & MINERAL PO) Take 1 tablet by mouth daily.       phenazopyridine (PYRIDIUM) 200 MG tablet Take 1 tablet (200 mg total) by mouth 3 (three) times daily. 6 tablet 0   Semaglutide, 1 MG/DOSE, (OZEMPIC, 1 MG/DOSE,) 2 MG/1.5ML SOPN Inject 1 mg into the skin once a week. 4 pen 3   simvastatin (ZOCOR) 80 MG tablet Take 0.5 tablets (40 mg total) by mouth at bedtime. 15 tablet 0   tamsulosin (FLOMAX) 0.4 MG CAPS capsule TAKE TWO CAPSULES BY MOUTH AT BEDTIME 180 capsule 0   tetracycline (SUMYCIN) 250 MG capsule      traMADol (ULTRAM) 50 MG tablet Take 50 mg by mouth 2 (two) times daily.     triamcinolone cream (KENALOG) 0.1 % Apply topically 2 (two) times daily. 30 g 1   No current facility-administered medications for this visit.    Allergies:   Phenothiazines and Actos [pioglitazone]    ROS:  Please see the history of present illness.   Otherwise, review of systems are positive for ***.   All other systems are reviewed and negative.    PHYSICAL EXAM: VS:  There were no vitals taken for this visit. , BMI There is no height or weight on file to calculate BMI. GENERAL:  Well appearing NECK:  No jugular venous distention, waveform within normal limits, carotid upstroke brisk and symmetric, no bruits, no thyromegaly LUNGS:   Clear to auscultation bilaterally CHEST:  Unremarkable HEART:  PMI not displaced or sustained,S1 and S2 within normal limits, no S3, no S4, no clicks, no rubs, *** murmurs ABD:  Flat, positive bowel sounds normal in frequency in pitch, no bruits, no rebound, no guarding, no midline pulsatile mass, no hepatomegaly, no splenomegaly EXT:  2 plus pulses throughout, no edema, no cyanosis no clubbing    ***GENERAL:  Well appearing NECK:  No jugular venous distention, waveform within normal limits, carotid upstroke brisk and symmetric, no bruits, no thyromegaly LUNGS:  Clear to auscultation bilaterally CHEST: 2 out of 6 brief apical systolic murmur nonradiating, HEART:  PMI not displaced or sustained,S1 and S2 within normal limits, no S3, no S4, no clicks, no rubs, brief apical systolic murmur, no diastolic murmurs ABD:  Flat, positive bowel sounds normal in frequency in pitch, no bruits, no rebound, no guarding, no midline pulsatile mass, no hepatomegaly, no splenomegaly EXT:  2 plus pulses throughout, bilateral mild lower extremity edema, no cyanosis no clubbing  EKG:  EKG is *** ordered today. The ekg ordered today demonstrates sinus rhythm with ventricular pacing 100% capture   Recent Labs: 06/26/2019: TSH 3.130 07/08/2019: ALT 24; Hemoglobin 12.9; Platelets 106 10/02/2019: BUN 23; Creatinine, Ser 1.70; Potassium 5.1; Sodium 133    Lipid Panel    Component Value Date/Time   CHOL 126 06/26/2019 0913   CHOL 136 08/02/2012 1117   TRIG 58 06/26/2019 0913   TRIG 98 12/09/2013 1054   TRIG 108 08/02/2012 1117   HDL 49 06/26/2019 0913   HDL 49 12/09/2013 1054   HDL 36 (L) 08/02/2012 1117   CHOLHDL 2.6 06/26/2019 0913   CHOLHDL 3.0 06/12/2016 1531   VLDL 28 06/12/2016 1531   LDLCALC 64 06/26/2019 0913   LDLCALC 95 08/30/2013 0812   LDLCALC 78 08/02/2012 1117      Wt Readings from Last 3 Encounters:  10/02/19 260 lb (117.9 kg)  09/23/19 260 lb 3.2 oz (118 kg)  08/07/19 261 lb (118.4  kg)      Other studies Reviewed: Additional studies/ records that were reviewed today include: *** Review of the above records demonstrates:  ***   ASSESSMENT AND PLAN:  CAD: ***At this point I do not think this is an anginal equivalent his dyspnea but will potentially evaluate this if no other clear etiology is identified and his symptoms worsen.   CHRONIC SYSTOLIC AND DIASTOLIC HF:   ***I think he is likely euvolemic.  He understands salt and fluid restriction.  AORTIC VALVE REPLACEMENT, HX OF: *** I did not consider reevaluating this for further work-up of his dyspnea pending the results below.   HYPERTENSION:  *** His blood pressure is well controlled.  No change in therapy.   CAROTID DISEASE -  He had mild plaque on Doppler in July.  ***  needs carotid Doppler and I will arrange this.  PPM: He is up to date with follow up and saw Dr. Lovena Le earlier this year. ***  PAROXYSMAL ATRIAL FIB - He tolerates anticoagulation.  He had has had PAF. I do plan to continue his amiodarone.  *** of atrial fib/flutter on his last device interrogation.   Of note he is on on the dosing but seems to be for the most part keeping him in sinus rhythm so I will continue his amiodarone.  DM: A1C was *** 7.4 which is better than previous.   DYSPNEA: He was to have PFTs.  ***  I am going to start with pulmonary function testing.  I will consider ischemia work-up and further imaging of his valve pending these results.  Given his amiodarone I proceed the PFTs first.  COVID EDUCATION:  ***   Current medicines are reviewed at length with the patient today.  The patient does not have concerns regarding medicines.  The following changes have been made:  ***  Labs/ tests ordered today include: ***  No orders of the defined types were placed in this encounter.    Disposition:   FU with me in *** months.   Signed, Minus Breeding, MD  10/08/2019 12:05 PM    [facility]

## 2019-10-08 NOTE — Telephone Encounter (Signed)
Returned patients call.  Advised patient he was given Rocephin injection which is an antibiotic for infection.  Patient verbalized understanding

## 2019-10-09 ENCOUNTER — Ambulatory Visit: Payer: Medicare Other | Admitting: Cardiology

## 2019-10-09 LAB — CUP PACEART REMOTE DEVICE CHECK
Battery Remaining Longevity: 95 mo
Battery Voltage: 2.99 V
Brady Statistic AP VP Percent: 7.52 %
Brady Statistic AP VS Percent: 0 %
Brady Statistic AS VP Percent: 92.47 %
Brady Statistic AS VS Percent: 0.01 %
Brady Statistic RA Percent Paced: 7.51 %
Brady Statistic RV Percent Paced: 99.99 %
Date Time Interrogation Session: 20210913161905
Implantable Lead Implant Date: 20180521
Implantable Lead Implant Date: 20180521
Implantable Lead Location: 753859
Implantable Lead Location: 753859
Implantable Lead Model: 3830
Implantable Lead Model: 5076
Implantable Pulse Generator Implant Date: 20180521
Lead Channel Impedance Value: 304 Ohm
Lead Channel Impedance Value: 342 Ohm
Lead Channel Impedance Value: 399 Ohm
Lead Channel Impedance Value: 418 Ohm
Lead Channel Pacing Threshold Amplitude: 0.875 V
Lead Channel Pacing Threshold Amplitude: 1 V
Lead Channel Pacing Threshold Pulse Width: 0.4 ms
Lead Channel Pacing Threshold Pulse Width: 0.4 ms
Lead Channel Sensing Intrinsic Amplitude: 1.125 mV
Lead Channel Sensing Intrinsic Amplitude: 1.125 mV
Lead Channel Sensing Intrinsic Amplitude: 19.625 mV
Lead Channel Sensing Intrinsic Amplitude: 19.625 mV
Lead Channel Setting Pacing Amplitude: 1.75 V
Lead Channel Setting Pacing Amplitude: 2.5 V
Lead Channel Setting Pacing Pulse Width: 0.4 ms
Lead Channel Setting Sensing Sensitivity: 2 mV

## 2019-10-09 NOTE — Progress Notes (Signed)
Remote pacemaker transmission.   

## 2019-10-21 ENCOUNTER — Telehealth: Payer: Self-pay | Admitting: Cardiology

## 2019-10-21 NOTE — Telephone Encounter (Signed)
Follow Up:    Pt wants to know if his results are ready from the breathing test he had a few weeks ago please?

## 2019-10-21 NOTE — Telephone Encounter (Signed)
Pt called back and given his PFT results...having a hard time finding the pt an appt that is convenient for him to return and see Dr. [name]. He wants to be seen in [place] but it will be several weeks/ months until we can get him in there... he is asking to be seen 10/24/19 he will be in [place] at [facility] at [place] but Dr. Rosezella Florida schedule is full.   Will forward to Dr. Rosezella Florida nurse/ Lonn Georgia to review and see if she can help find the pt a more appropriate follow up appt.   Pt declined appt on 28/20/60 due to conflict.

## 2019-10-22 NOTE — Telephone Encounter (Signed)
Spoke with patient and offered him several appointments however he refused them all  He only wants morning appointments, advised no morning appointments available  Patient stated forget it then I asked if he wanted to forget appointment and he replied yes if he couldn't get a morning call or maybe Dr [name] could just call him  Explained Dr [name] would likely want to see him in office but would forward message to him

## 2019-10-22 NOTE — Telephone Encounter (Signed)
I did edit the result when the test was finished.  Please make sure it only showed restrictive lung disease.  No treatment is necessary.  I want to further talk to him about his SOB and see if other testing needs to be done.  I need to do this in the office.  He was a no show for the last appt.  We can schedule him follow up when there is an available morning appt.

## 2019-10-24 ENCOUNTER — Other Ambulatory Visit: Payer: Self-pay | Admitting: *Deleted

## 2019-10-24 MED ORDER — TAMSULOSIN HCL 0.4 MG PO CAPS: 0.8000 mg | ORAL_CAPSULE | Freq: Every day | ORAL | 0 refills | Status: DC

## 2019-10-24 NOTE — Telephone Encounter (Signed)
Message sent to scheduling to arrange

## 2019-10-30 ENCOUNTER — Ambulatory Visit (INDEPENDENT_AMBULATORY_CARE_PROVIDER_SITE_OTHER): Payer: Medicare Other | Admitting: Family Medicine

## 2019-10-30 ENCOUNTER — Encounter: Payer: Self-pay | Admitting: Family Medicine

## 2019-10-30 ENCOUNTER — Other Ambulatory Visit: Payer: Self-pay

## 2019-10-30 VITALS — BP 105/54 | HR 62 | Temp 98.2°F | Ht 73.0 in | Wt 261.0 lb

## 2019-10-30 DIAGNOSIS — Z23 Encounter for immunization: Secondary | ICD-10-CM | POA: Diagnosis not present

## 2019-10-30 DIAGNOSIS — Z794 Long term (current) use of insulin: Secondary | ICD-10-CM

## 2019-10-30 DIAGNOSIS — E1140 Type 2 diabetes mellitus with diabetic neuropathy, unspecified: Secondary | ICD-10-CM

## 2019-10-30 DIAGNOSIS — Z1211 Encounter for screening for malignant neoplasm of colon: Secondary | ICD-10-CM

## 2019-10-30 DIAGNOSIS — E039 Hypothyroidism, unspecified: Secondary | ICD-10-CM | POA: Diagnosis not present

## 2019-10-30 DIAGNOSIS — R82998 Other abnormal findings in urine: Secondary | ICD-10-CM

## 2019-10-30 DIAGNOSIS — N390 Urinary tract infection, site not specified: Secondary | ICD-10-CM | POA: Diagnosis not present

## 2019-10-30 DIAGNOSIS — N35914 Unspecified anterior urethral stricture, male: Secondary | ICD-10-CM | POA: Diagnosis not present

## 2019-10-30 DIAGNOSIS — I10 Essential (primary) hypertension: Secondary | ICD-10-CM

## 2019-10-30 LAB — MICROSCOPIC EXAMINATION
RBC, Urine: NONE SEEN /hpf (ref 0–2)
WBC, UA: >30 /hpf — AB (ref 0–5)

## 2019-10-30 LAB — URINALYSIS, COMPLETE
Bilirubin, UA: NEGATIVE
Ketones, UA: NEGATIVE
Nitrite, UA: NEGATIVE
Protein,UA: NEGATIVE
Specific Gravity, UA: 1.015 (ref 1.005–1.030)
Urobilinogen, Ur: 0.2 mg/dL (ref 0.2–1.0)
pH, UA: 5.5 (ref 5.0–7.5)

## 2019-10-30 NOTE — Addendum Note (Signed)
Addended by: Alphonzo Dublin on: 10/30/2019 10:29 AM   Modules accepted: Orders

## 2019-10-30 NOTE — Progress Notes (Signed)
BP (!) 105/54   Pulse 62   Temp 98.2 F (36.8 C)   Ht '6\' 1"'  (1.854 m)   Wt 261 lb (118.4 kg)   SpO2 98%   BMI 34.43 kg/m    Subjective:   Patient ID: REA RESER, male    DOB: 04-27-1945, 75 y.o.   MRN: [id number]  HPI: GEROD CALIGIURI is a 74 y.o. male presenting on 10/30/2019 for Medical Management of Chronic Issues, Diabetes, and Hypothyroidism   HPI Type 2 diabetes mellitus Patient comes in today for recheck of his diabetes. Patient has been currently taking Levemir and Metformin and Ozempic. Patient is currently on an ACE inhibitor/ARB. Patient has not seen an ophthalmologist this year. Patient denies any issues with their feet. The symptom started onset as an adult hyperlipidemia and hypothyroidism ARE RELATED TO DM   Hypothyroidism recheck Patient is coming in for thyroid recheck today as well. They deny any issues with hair changes or heat or cold problems or diarrhea or constipation. They deny any chest pain or palpitations. They are currently on levothyroxine 175 micrograms   Hyperlipidemia Patient is coming in for recheck of his hyperlipidemia. The patient is currently taking simvastatin. They deny any issues with myalgias or history of liver damage from it. They deny any focal numbness or weakness or chest pain.   Patient is having darkened urine again, will leave urinalysis.  Patient has had recurrent problems with urinary tract infections, he says he is going to have a surgery where they are going to do something to help extracts the head of his penis because it is sunken and and he cannot clean it very well and that is why he is getting recurrent UTIs.  He is seeing urology for this.  Relevant past medical, surgical, family and social history reviewed and updated as indicated. Interim medical history since our last visit reviewed. Allergies and medications reviewed and updated.  Review of Systems  Constitutional: Negative for chills and fever.  Respiratory:  Negative for shortness of breath and wheezing.   Cardiovascular: Negative for chest pain and leg swelling.  Genitourinary: Positive for frequency and urgency. Negative for difficulty urinating, dysuria and hematuria.  Skin: Negative for rash.  All other systems reviewed and are negative.   Per HPI unless specifically indicated above   Allergies as of 10/30/2019      Reactions   Phenothiazines Anaphylaxis   Actos [pioglitazone] Swelling      Medication List       Accurate as of October 30, 2019 10:07 AM. If you have any questions, ask your nurse or doctor.        acetaminophen 500 MG tablet Commonly known as: TYLENOL Take 1,000 mg by mouth every 6 (six) hours as needed (pain).   amiodarone 200 MG tablet Commonly known as: PACERONE Take 1 Tablet Daily Monday -Friday and Take 1 Tablet Two Times Daily on Saturday and Sunday   azelastine 0.1 % nasal spray Commonly known as: ASTELIN Place 1 spray into both nostrils 2 (two) times daily.   cholecalciferol 1000 units tablet Commonly known as: VITAMIN D Take 1,000 Units by mouth daily.   Eliquis 5 MG Tabs tablet Generic drug: apixaban TAKE ONE TABLET BY MOUTH TWICE DAILY What changed: how much to take   furosemide 20 MG tablet Commonly known as: LASIX Take 1 tablet (20 mg total) by mouth daily.   gabapentin 300 MG capsule Commonly known as: NEURONTIN Take 1 capsule (300 mg total) by  mouth 2 (two) times daily.   glucose blood test strip 1 strip by Does not apply route 3 (three) times daily. Uses true metrix strips (diabetic club)   insulin detemir 100 UNIT/ML injection Commonly known as: LEVEMIR Inject 0.47-0.48 mLs (47-48 Units total) into the skin 2 (two) times daily. What changed: how much to take   levothyroxine 175 MCG tablet Commonly known as: SYNTHROID Take 1 tablet (175 mcg total) by mouth daily before breakfast. (Needs labwork)   lisinopril 5 MG tablet Commonly known as: ZESTRIL Take 5 mg by mouth  daily.   MENS MULTI VITAMIN & MINERAL PO Take 1 tablet by mouth daily.   metFORMIN 1000 MG tablet Commonly known as: GLUCOPHAGE TAKE 1 TABLET BY MOUTH TWICE DAILY WITH A MEAL. What changed: See the new instructions.   Ozempic (1 MG/DOSE) 2 MG/1.5ML Sopn Generic drug: Semaglutide (1 MG/DOSE) Inject 1 mg into the skin once a week.   phenazopyridine 200 MG tablet Commonly known as: PYRIDIUM Take 1 tablet (200 mg total) by mouth 3 (three) times daily.   simvastatin 80 MG tablet Commonly known as: ZOCOR Take 0.5 tablets (40 mg total) by mouth at bedtime.   tamsulosin 0.4 MG Caps capsule Commonly known as: FLOMAX Take 2 capsules (0.8 mg total) by mouth at bedtime.   tetracycline 250 MG capsule Commonly known as: SUMYCIN   traMADol 50 MG tablet Commonly known as: ULTRAM Take 50 mg by mouth 2 (two) times daily.   triamcinolone cream 0.1 % Commonly known as: KENALOG Apply topically 2 (two) times daily.        Objective:   BP (!) 105/54   Pulse 62   Temp 98.2 F (36.8 C)   Ht '6\' 1"'  (1.854 m)   Wt 261 lb (118.4 kg)   SpO2 98%   BMI 34.43 kg/m   Wt Readings from Last 3 Encounters:  10/30/19 261 lb (118.4 kg)  10/02/19 260 lb (117.9 kg)  09/23/19 260 lb 3.2 oz (118 kg)    Physical Exam Vitals and nursing note reviewed.  Constitutional:      General: He is not in acute distress.    Appearance: He is well-developed. He is not diaphoretic.  Eyes:     General: No scleral icterus.    Conjunctiva/sclera: Conjunctivae normal.  Neck:     Thyroid: No thyromegaly.  Cardiovascular:     Rate and Rhythm: Normal rate and regular rhythm.     Heart sounds: Normal heart sounds. No murmur heard.   Pulmonary:     Effort: Pulmonary effort is normal. No respiratory distress.     Breath sounds: Normal breath sounds. No wheezing.  Abdominal:     General: Abdomen is flat. Bowel sounds are normal. There is no distension.     Tenderness: There is no abdominal tenderness.   Musculoskeletal:        General: Normal range of motion.     Cervical back: Neck supple.  Lymphadenopathy:     Cervical: No cervical adenopathy.  Skin:    General: Skin is warm and dry.     Findings: No rash.  Neurological:     Mental Status: He is alert and oriented to person, place, and time.     Coordination: Coordination normal.  Psychiatric:        Behavior: Behavior normal.       Assessment & Plan:   Problem List Items Addressed This Visit      Cardiovascular and Mediastinum   Essential hypertension  Endocrine   Type 2 diabetes mellitus with diabetic neuropathy, unspecified (HCC)   Hypothyroidism - Primary   Relevant Orders   TSH    Other Visit Diagnoses    Flu vaccine need       Relevant Orders   BMP8+EGFR   Flu Vaccine QUAD High Dose(Fluad) (Completed)   Dark urine       Relevant Orders   Urine Culture   Urinalysis, Complete      Will check urinalysis, no change in medication for now, will check thyroid and blood work as well.  Patient's A1c was done 1 month ago so not going to do it today, follow-up in 3 months. Follow up plan: Return in about 3 months (around 01/30/2020), or if symptoms worsen or fail to improve, for Diabetes and hypertension and thyroid.  Counseling provided for all of the vaccine components Orders Placed This Encounter  Procedures  . Flu Vaccine QUAD High Dose(Fluad)  . BMP8+EGFR    Caryl Pina, MD [facility] 10/30/2019, 10:07 AM

## 2019-10-31 ENCOUNTER — Encounter: Payer: Self-pay | Admitting: Internal Medicine

## 2019-10-31 ENCOUNTER — Telehealth: Payer: Self-pay

## 2019-10-31 LAB — BMP8+EGFR
BUN/Creatinine Ratio: 15 (ref 10–24)
BUN: 20 mg/dL (ref 8–27)
CO2: 24 mmol/L (ref 20–29)
Calcium: 9 mg/dL (ref 8.6–10.2)
Chloride: 98 mmol/L (ref 96–106)
Creatinine, Ser: 1.32 mg/dL — ABNORMAL HIGH (ref 0.76–1.27)
GFR calc Af Amer: 60 mL/min/{1.73_m2} (ref >59)
GFR calc non Af Amer: 52 mL/min/{1.73_m2} — ABNORMAL LOW (ref >59)
Glucose: 203 mg/dL — ABNORMAL HIGH (ref 65–99)
Potassium: 4.6 mmol/L (ref 3.5–5.2)
Sodium: 136 mmol/L (ref 134–144)

## 2019-10-31 LAB — TSH: TSH: 1.61 u[IU]/mL (ref 0.450–4.500)

## 2019-10-31 NOTE — Telephone Encounter (Signed)
LM2CB-need type of surgery and anesthesia type.

## 2019-10-31 NOTE — Telephone Encounter (Signed)
Patient is calling about his labs from yesterday.  Please review and advise

## 2019-10-31 NOTE — Telephone Encounter (Signed)
   [facility] Pre-operative Risk Assessment    Request for surgical clearance:  1. What type of surgery is being performed? LM2CB-NOT LISTED   2. When is this surgery scheduled? TBD   3. What type of clearance is required (medical clearance vs. Pharmacy clearance to hold med vs. Both)? MEDICATION  4. Are there any medications that need to be held prior to surgery and how long? ELIQUIS-5 DAYS   5. Practice name and name of physician performing surgery? [facility]     6. What is the office phone number? [phone]   7.   What is the office fax number? [phone]  8.   Anesthesia type (None, local, MAC, general) ? NOT LISTED-LM2CB

## 2019-11-01 NOTE — Telephone Encounter (Signed)
Left message x 3 to please call our office with what procedure will the pt be having as well as type of anesthesia that may be used. Pt does have appt with Dr. [name] 11/13/19.

## 2019-11-01 NOTE — Telephone Encounter (Signed)
Pt called to get lab results. Reviewed results with pt per Dr Neldon Mc notes. Pt voiced understanding. Pt wants to be called with urine culture results when available.

## 2019-11-01 NOTE — Telephone Encounter (Signed)
Follow up is scheduled

## 2019-11-01 NOTE — Telephone Encounter (Signed)
Will send clearance notes to MD Dr. [name] for pre op assessment. Will remove from the pre op call back pool. Will send FYI to requesting office pt has appt 11/13/19 with Dr. [name].

## 2019-11-01 NOTE — Telephone Encounter (Signed)
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

## 2019-11-01 NOTE — Telephone Encounter (Addendum)
Patient with diagnosis of A Fib  on Eliquis for anticoagulation.    Procedure: acquired buried penis, Date of procedure: TBD  CHADS2-VASc score of  6 (CHF, HTN, AGE x 2, DM2, CAD)  CrCl 64 mL/min using adjusted body weight Platelet count 106K  Per office protocol, patient can hold Eliquis for 2-3 days prior to procedure.    If not bridging, patient should restart Eliquis on the evening of procedure or day after, at discretion of procedure MD

## 2019-11-01 NOTE — Telephone Encounter (Signed)
Pharmacy please review anticoagulation.   Patient has appointment with Dr. [name] 10/2019 for ongoing evaluation of shortness of breath. Will review clearance at time of office visit.

## 2019-11-02 LAB — URINE CULTURE

## 2019-11-05 ENCOUNTER — Telehealth: Payer: Self-pay

## 2019-11-05 ENCOUNTER — Telehealth: Payer: Self-pay | Admitting: Family Medicine

## 2019-11-05 MED ORDER — SULFAMETHOXAZOLE-TRIMETHOPRIM 800-160 MG PO TABS: 1.0000 | ORAL_TABLET | Freq: Two times a day (BID) | ORAL | 0 refills | Status: DC

## 2019-11-05 NOTE — Telephone Encounter (Signed)
On urine culture patient did end up growing a bacteria that is sensitive to Bactrim so I sent Bactrim for him

## 2019-11-05 NOTE — Telephone Encounter (Signed)
Pt informed to take Bactrim

## 2019-11-05 NOTE — Telephone Encounter (Signed)
Explained to patient that his urine showed many bacteria. Bactrim that Dr. Warrick Parisian has prescribed will treat it.

## 2019-11-10 DIAGNOSIS — M79662 Pain in left lower leg: Secondary | ICD-10-CM | POA: Diagnosis not present

## 2019-11-10 DIAGNOSIS — S99912A Unspecified injury of left ankle, initial encounter: Secondary | ICD-10-CM | POA: Diagnosis not present

## 2019-11-10 DIAGNOSIS — Z952 Presence of prosthetic heart valve: Secondary | ICD-10-CM | POA: Diagnosis not present

## 2019-11-10 DIAGNOSIS — W1830XA Fall on same level, unspecified, initial encounter: Secondary | ICD-10-CM | POA: Diagnosis not present

## 2019-11-10 DIAGNOSIS — R9431 Abnormal electrocardiogram [ECG] [EKG]: Secondary | ICD-10-CM | POA: Diagnosis not present

## 2019-11-10 DIAGNOSIS — M25572 Pain in left ankle and joints of left foot: Secondary | ICD-10-CM | POA: Diagnosis not present

## 2019-11-10 DIAGNOSIS — Z602 Problems related to living alone: Secondary | ICD-10-CM | POA: Diagnosis not present

## 2019-11-11 ENCOUNTER — Telehealth: Payer: Self-pay | Admitting: Cardiology

## 2019-11-11 NOTE — Telephone Encounter (Signed)
Called and spoke with the patient who says he had a fall/potential syncopal episode yesterday afternoon when getting in his car. He went to the ED to be evaluated and states "they did not find anything wrong". He is requesting to see Dr. [name] as soon as possible due to SOB that he has been experiencing for the last week. He is not audibly SOB on phone, states it usually occurs on exertion. Patient is unsure if he has had any weight gain and denies swelling.   Patient had a preop clearance for a urology procedure. Had preop appt scheduled with [facility] for 11/13/19. Patient states he is going to cancel this procedure.   Advised the patient to keep his 10/20 appt with Dr. [name] in order to be evaluated for his exertional SOB. Left the appointment as a preop appointment in case patient changes his mind regarding upcoming procedure.

## 2019-11-11 NOTE — Telephone Encounter (Signed)
Pt c/o Shortness Of Breath: STAT if SOB developed within the last 24 hours or pt is noticeably SOB on the phone  1. Are you currently SOB (can you hear that pt is SOB on the phone)?  Just a little right now   2. How long have you been experiencing SOB? She said about a week  3. Are you SOB when sitting or when up moving around? Just when he is up moving around   4. Are you currently experiencing any other symptoms? No not right now.    Pt went to ED yesterday because he got out of church and opened up his care door and passed out.  He stated he was weak in his leg before he passed out.  Pt wanted to get in to see Dr [name] as soon has he can.  Per ED they could not find anything wrong. Pt stated they did blood work and a Soil scientist number [phone]

## 2019-11-12 NOTE — Progress Notes (Signed)
Cardiology Office Note   Date:  11/13/2019   ID:  [name], DOB [birth year], MRN [id number]  PCP:  Dettinger, Fransisca Kaufmann, MD  Cardiologist:   No primary care provider on file.   Chief Complaint  Patient presents with  . Fatigue      History of Present Illness: [name] is a 74 y.o. male who for followup of aortic valve replacement, follow up of CAD, atrial fib and pacemaker placement for symptomatic bradycardia.  At the last visit he had increased SOB.   I sent him for PFTs.  He had moderate restrictive lung disease but he missed follow up to discuss these results and we had trouble rescheduling.  He had a fall with questionable syncope this week and was seen in the ED.  I reviewed these records for this visit.      Has had a complicated recent history.  He has syncope and was hospitalized at [facility] in August and had a work-up.  Is able to review some of these records.  In particular he had a normal echocardiogram.  There was no clear etiology identified for his syncope.  He had some trauma he has some cervical nondisplaced lamina fractures.  He had no acute intracranial findings.  He has a pacemaker and is up-to-date with follow-up and he has had normal function.  He had another event a few days ago.  He said he was getting out of church and going to his car.  He got in his car and he had a loss of consciousness very briefly and went down and injured his ankle.  He again went to the [facility] ER.  There was no fracture.  He has been referred to neurosurgery because he had progressive lower extremity weakness and apparently to evaluate cervical fractures as well.  He says he has days where his legs are weak like today when he had to have assistance walking in and other days where he can do better.  He can drive his car and walk into the grocery store but then wants a cart.  He does not describe his heart racing or skipping.  Is not having any chest pressure, neck or  arm discomfort.  He is not having any new shortness of breath and actually reports this complaint to be relatively stable.  He is not having any PND or orthopnea.  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  He has put this off however.   Past Medical History:  Diagnosis Date  . Arthritis   . Atrial fibrillation ([place])   . BPH (benign prostatic hypertrophy) 04/15/2010  . Cataract   . Colon polyps   . DEPRESSION 01/22/2009  . Heart valve replaced   . HYPERCHOLESTEROLEMIA 01/22/2009  . HYPERTENSION 01/22/2009   Dr. [name]  . 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 Morbid obesity ([place])   . Tremors of nervous system    ?ptsd  . Ulcer     Past Surgical History:  Procedure Laterality Date  . 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 APPENDECTOMY    . bilateral cataract surg    . COLONOSCOPY  04/24/2007   [name]: normal  . COLONOSCOPY WITH ESOPHAGOGASTRODUODENOSCOPY (EGD) N/A 04/05/2012   Procedure: COLONOSCOPY WITH ESOPHAGOGASTRODUODENOSCOPY (EGD);  Surgeon: Daneil Dolin, MD;  [facility];  Service: Endoscopy;  Laterality: N/A;  10;15  .  DENTAL SURGERY  05/2004   Dental extractions  . EYE SURGERY Bilateral    cataract  . HEEL SPUR SURGERY Bilateral    resection of heel spur  . KNEE ARTHROSCOPY WITH LATERAL MENISECTOMY Right 04/04/2014   Procedure: KNEE ARTHROSCOPY WITH LATERAL MENISECTOMY;  Surgeon: Carole Civil, MD;  Location: [facility] ORS;  Service: Orthopedics;  Laterality: Right;  . PACEMAKER IMPLANT N/A 06/13/2016   Procedure: Pacemaker Implant- Dual Chamber;  Surgeon: Evans Lance, MD;  Location: [place] CV LAB;  Service: Cardiovascular;  Laterality: N/A;  . PACEMAKER INSERTION  2018  . THYROIDECTOMY  03/21/2013   DR [name]  . THYROIDECTOMY N/A 03/21/2013   Procedure: THYROIDECTOMY;  Surgeon: [name], MD;  Location: [place];  Service: General;   Laterality: N/A;  . TONSILLECTOMY       Current Outpatient Medications  Medication Sig Dispense Refill  . acetaminophen (TYLENOL) 500 MG tablet Take 1,000 mg by mouth every 6 (six) hours as needed (pain).    Marland Kitchen azelastine (ASTELIN) 0.1 % nasal spray Place 1 spray into both nostrils 2 (two) times daily. 30 mL 12  . cholecalciferol (VITAMIN D) 1000 units tablet Take 1,000 Units by mouth daily.     Marland Kitchen ELIQUIS 5 MG TABS tablet TAKE ONE TABLET BY MOUTH TWICE DAILY (Patient taking differently: Take 5 mg by mouth 2 (two) times daily. ) 60 tablet 5  . furosemide (LASIX) 20 MG tablet Take 1 tablet (20 mg total) by mouth daily. 30 tablet 1  . gabapentin (NEURONTIN) 300 MG capsule Take 1 capsule (300 mg total) by mouth 2 (two) times daily. 180 capsule 3  . insulin detemir (LEVEMIR) 100 UNIT/ML injection Inject 0.47-0.48 mLs (47-48 Units total) into the skin 2 (two) times daily. (Patient taking differently: Inject 38-40 Units into the skin 2 (two) times daily. ) 10 mL   . levothyroxine (SYNTHROID) 175 MCG tablet Take 1 tablet (175 mcg total) by mouth daily before breakfast. (Needs labwork) 30 tablet 0  . lisinopril (ZESTRIL) 5 MG tablet Take 5 mg by mouth daily.    . metFORMIN (GLUCOPHAGE) 1000 MG tablet TAKE 1 TABLET BY MOUTH TWICE DAILY WITH A MEAL. (Patient taking differently: Take 1,000 mg by mouth in the morning and at bedtime. ) 180 tablet 0  . Multiple Vitamins-Minerals (MENS MULTI VITAMIN & MINERAL PO) Take 1 tablet by mouth daily.      . phenazopyridine (PYRIDIUM) 200 MG tablet Take 1 tablet (200 mg total) by mouth 3 (three) times daily. 6 tablet 0  . Semaglutide, 1 MG/DOSE, (OZEMPIC, 1 MG/DOSE,) 2 MG/1.5ML SOPN Inject 1 mg into the skin once a week. 4 pen 3  . simvastatin (ZOCOR) 80 MG tablet Take 0.5 tablets (40 mg total) by mouth at bedtime. 15 tablet 0  . sulfamethoxazole-trimethoprim (BACTRIM DS) 800-160 MG tablet Take 1 tablet by mouth 2 (two) times daily. 20 tablet 0  . tamsulosin (FLOMAX)  0.4 MG CAPS capsule Take 2 capsules (0.8 mg total) by mouth at bedtime. 180 capsule 0  . traMADol (ULTRAM) 50 MG tablet Take 50 mg by mouth 2 (two) times daily.    Marland Kitchen triamcinolone cream (KENALOG) 0.1 % Apply topically 2 (two) times daily. 30 g 1  . glucose blood test strip 1 strip by Does not apply route 3 (three) times daily. Uses true metrix strips (diabetic club)  12   No current facility-administered medications for this visit.    Allergies:   Phenothiazines and Actos [pioglitazone]  ROS:  Please see the history of present illness.   Otherwise, review of systems are positive for none.   All other systems are reviewed and negative.    PHYSICAL EXAM: VS:  BP 120/60   Pulse 60   Ht 6\' 1"  (1.854 m)   Wt 266 lb (120.7 kg)   BMI 35.09 kg/m  , BMI Body mass index is 35.09 kg/m. GEN:  No distress NECK:  No jugular venous distention at 90 degrees, waveform within normal limits, carotid upstroke brisk and symmetric, no bruits, no thyromegaly LYMPHATICS:  No cervical adenopathy LUNGS:  Clear to auscultation bilaterally BACK:  No CVA tenderness CHEST:  Unremarkable HEART:  S1 and S2 within normal limits, no S3, no S4, no clicks, no rubs, brief apical systolic murmur heard at the apex, no diastolic murmurs ABD:  Positive bowel sounds normal in frequency in pitch, no bruits, no rebound, no guarding, unable to assess midline mass or bruit with the patient seated. EXT:  2 plus pulses throughout, trace edema, no cyanosis no clubbing SKIN:  No rashes no nodules NEURO:  Cranial nerves II through XII grossly intact, motor grossly intact throughout PSYCH:  Cognitively intact, oriented to person place and time  EKG:  EKG is  ordered today. The ekg ordered today demonstrates sinus rhythm with ventricular pacing 100% capture   Recent Labs: 07/08/2019: ALT 24; Hemoglobin 12.9; Platelets 106 10/30/2019: BUN 20; Creatinine, Ser 1.32; Potassium 4.6; Sodium 136; TSH 1.610    Lipid Panel     Component Value Date/Time   CHOL 126 06/26/2019 0913   CHOL 136 08/02/2012 1117   TRIG 58 06/26/2019 0913   TRIG 98 12/09/2013 1054   TRIG 108 08/02/2012 1117   HDL 49 06/26/2019 0913   HDL 49 12/09/2013 1054   HDL 36 (L) 08/02/2012 1117   CHOLHDL 2.6 06/26/2019 0913   CHOLHDL 3.0 06/12/2016 1531   VLDL 28 06/12/2016 1531   LDLCALC 64 06/26/2019 0913   LDLCALC 95 08/30/2013 0812   LDLCALC 78 08/02/2012 1117      Wt Readings from Last 3 Encounters:  11/13/19 266 lb (120.7 kg)  10/30/19 261 lb (118.4 kg)  10/02/19 260 lb (117.9 kg)     Other studies Reviewed: Additional studies/ records that were reviewed today include: Extensive review of outside records and [facility].   (Greater than 40 minutes reviewing all data with greater than 50% face to face with the patient). Review of the above records demonstrates:  Please see elsewhere in the note.     ASSESSMENT AND PLAN:  CAD: At this point I do not think there is an anginal equivalent.  He is not complaining of any chest pain.  He will continue with risk reduction.   CHRONIC SYSTOLIC AND DIASTOLIC HF:   He seems to be euvolemic and has had no evidence of volume overload.  No change in therapy. \  AORTIC VALVE REPLACEMENT, HX OF: I reviewed the results from the echocardiogram earlier this summer as above and he has had normal valve function.   HYPERTENSION:  His blood pressure is at target.  We are unable to assess orthostatics as he could not get up on the exam table.   PPM: He is up to date with follow up.  I reviewed the September readings and he is up-to-date with follow-up with predominantly atrial sensed ventricular paced rhythm.    PAROXYSMAL ATRIAL FIB - He tolerates anticoagulation.  He has had PAF.   He tolerates anticoagulation.  However, given his leg weakness and the possibility of neuropathy related to amiodarone and also some shortness of breath I feel compelled to stop the amiodarone and see how he  does off of this.   FALL: The patient sounds like he has had a couple of episodes of syncope but now has had an extensive work-up.  There is no obstructive disease on his carotids.  He had no acute findings on head CT.  I was unable to check orthostatics.  He has had normal pacemaker function.  He has normal heart valve function.  The etiology is not clear and is not entirely clear that he has had syncope versus just mechanical fall.  I agree with him seeing a neurosurgeon to assess his lower extremity weakness as the next step.  DM: A1C was 7.3.  No change in therapy.   DYSPNEA: He had some mild restriction on his pulmonary function test.  I am going to stop the amiodarone and if he has any continued shortness of breath (which is not a predominant complaint today) I will likely need to send him to pulmonary.   Current medicines are reviewed at length with the patient today.  The patient does not have concerns regarding medicines.  The following changes have been made: As above  Labs/ tests ordered today include: None  Orders Placed This Encounter  Procedures  . EKG 12-Lead     Disposition:   FU with me in 2  months.   Signed, Minus Breeding, MD  11/13/2019 4:46 PM    [facility]

## 2019-11-13 ENCOUNTER — Ambulatory Visit (INDEPENDENT_AMBULATORY_CARE_PROVIDER_SITE_OTHER): Payer: Medicare Other | Admitting: Cardiology

## 2019-11-13 ENCOUNTER — Other Ambulatory Visit: Payer: Self-pay

## 2019-11-13 ENCOUNTER — Encounter: Payer: Self-pay | Admitting: Cardiology

## 2019-11-13 VITALS — BP 120/60 | HR 60 | Ht 73.0 in | Wt 266.0 lb

## 2019-11-13 DIAGNOSIS — I10 Essential (primary) hypertension: Secondary | ICD-10-CM | POA: Diagnosis not present

## 2019-11-13 DIAGNOSIS — Z952 Presence of prosthetic heart valve: Secondary | ICD-10-CM

## 2019-11-13 DIAGNOSIS — I4891 Unspecified atrial fibrillation: Secondary | ICD-10-CM | POA: Diagnosis not present

## 2019-11-13 DIAGNOSIS — S93402A Sprain of unspecified ligament of left ankle, initial encounter: Secondary | ICD-10-CM | POA: Diagnosis not present

## 2019-11-13 DIAGNOSIS — I2510 Atherosclerotic heart disease of native coronary artery without angina pectoris: Secondary | ICD-10-CM

## 2019-11-13 DIAGNOSIS — I5042 Chronic combined systolic (congestive) and diastolic (congestive) heart failure: Secondary | ICD-10-CM

## 2019-11-13 DIAGNOSIS — R0602 Shortness of breath: Secondary | ICD-10-CM | POA: Diagnosis not present

## 2019-11-13 NOTE — Patient Instructions (Signed)
Medication Instructions:  Please discontinue your Amiodarone. Continue all other medications as listed.  *If you need a refill on your cardiac medications before your next appointment, please call your pharmacy*  Follow-Up: At [facility], you and your health needs are our priority.  As part of our continuing mission to provide you with exceptional heart care, we have created designated Provider Care Teams.  These Care Teams include your primary Cardiologist (physician) and Advanced Practice Providers (APPs -  Physician Assistants and Nurse Practitioners) who all work together to provide you with the care you need, when you need it.  We recommend signing up for the patient portal called "MyChart".  Sign up information is provided on this After Visit Summary.  MyChart is used to connect with patients for Virtual Visits (Telemedicine).  Patients are able to view lab/test results, encounter notes, upcoming appointments, etc.  Non-urgent messages can be sent to your provider as well.   To learn more about what you can do with MyChart, go to [email or web address].    Your next appointment:   2-3 month(s)  The format for your next appointment:   In Person  Provider:   Minus Breeding, MD   Thank you for choosing [facility]!!

## 2019-11-14 DIAGNOSIS — M48062 Spinal stenosis, lumbar region with neurogenic claudication: Secondary | ICD-10-CM | POA: Diagnosis not present

## 2019-11-15 DIAGNOSIS — M79605 Pain in left leg: Secondary | ICD-10-CM | POA: Diagnosis not present

## 2019-11-15 DIAGNOSIS — M79662 Pain in left lower leg: Secondary | ICD-10-CM | POA: Diagnosis not present

## 2019-11-15 DIAGNOSIS — I10 Essential (primary) hypertension: Secondary | ICD-10-CM | POA: Diagnosis not present

## 2019-11-15 DIAGNOSIS — M898X6 Other specified disorders of bone, lower leg: Secondary | ICD-10-CM | POA: Diagnosis not present

## 2019-11-15 DIAGNOSIS — S93602A Unspecified sprain of left foot, initial encounter: Secondary | ICD-10-CM | POA: Diagnosis not present

## 2019-11-15 DIAGNOSIS — M7989 Other specified soft tissue disorders: Secondary | ICD-10-CM | POA: Diagnosis not present

## 2019-11-15 DIAGNOSIS — S93602S Unspecified sprain of left foot, sequela: Secondary | ICD-10-CM | POA: Diagnosis not present

## 2019-11-15 DIAGNOSIS — Z9889 Other specified postprocedural states: Secondary | ICD-10-CM | POA: Diagnosis not present

## 2019-11-15 DIAGNOSIS — X58XXXS Exposure to other specified factors, sequela: Secondary | ICD-10-CM | POA: Diagnosis not present

## 2019-11-15 DIAGNOSIS — M25572 Pain in left ankle and joints of left foot: Secondary | ICD-10-CM | POA: Diagnosis not present

## 2019-11-15 DIAGNOSIS — I739 Peripheral vascular disease, unspecified: Secondary | ICD-10-CM | POA: Diagnosis not present

## 2019-11-15 DIAGNOSIS — M79672 Pain in left foot: Secondary | ICD-10-CM | POA: Diagnosis not present

## 2019-11-15 DIAGNOSIS — E119 Type 2 diabetes mellitus without complications: Secondary | ICD-10-CM | POA: Diagnosis not present

## 2019-11-19 ENCOUNTER — Encounter: Payer: Self-pay | Admitting: Emergency Medicine

## 2019-11-19 ENCOUNTER — Ambulatory Visit
Admission: EM | Admit: 2019-11-19 | Discharge: 2019-11-19 | Disposition: A | Discharge status: Home / Self Care | Payer: Medicare Other | Attending: Emergency Medicine | Admitting: Emergency Medicine

## 2019-11-19 DIAGNOSIS — L03116 Cellulitis of left lower limb: Secondary | ICD-10-CM | POA: Diagnosis not present

## 2019-11-19 MED ORDER — DOXYCYCLINE HYCLATE 100 MG PO CAPS
100.0000 mg | ORAL_CAPSULE | Freq: Two times a day (BID) | ORAL | 0 refills | Status: AC
Start: 2019-11-19 — End: 2019-11-26

## 2019-11-19 NOTE — ED Triage Notes (Signed)
LT ankle pain. Redness and bruising to LT foot and around ankle.  Pt states he fell x 2 weeks ago and twisted his ankle.  Had neg xray at [facility] after the incident.

## 2019-11-19 NOTE — ED Provider Notes (Signed)
[facility]    CSN: [id number] Arrival date & time: 11/19/19  1043      History   Chief Complaint Chief Complaint  Patient presents with  . Ankle Pain    HPI [name] is a 74 y.o. male history of A. fib, hypertension, DM type II, presenting today for evaluation of left foot pain and swelling.  Reports he fell approximately 2 weeks ago and twisted his ankle.  Had x-rays at [facility] of his tibia/fibula, ankle and foot which were all unremarkable for acute abnormality.  He developed a blister to his foot which has since opened and has developed increased pain redness and swelling around it.  Denies fevers.  HPI  Past Medical History:  Diagnosis Date  . Arthritis   . Atrial fibrillation ([place])   . BPH (benign prostatic hypertrophy) 04/15/2010  . Cataract   . Colon polyps   . DEPRESSION 01/22/2009  . Heart valve replaced   . HYPERCHOLESTEROLEMIA 01/22/2009  . HYPERTENSION 01/22/2009   Dr. [name]  . 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 Morbid obesity ([place])   . Tremors of nervous system    ?ptsd  . Ulcer     Patient Active Problem List   Diagnosis Date Noted  . Severe nonproliferative diabetic retinopathy of both eyes ([place]) 08/27/2019  . 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 without macular edema ([place]) 08/27/2019  . Vitreous hemorrhage of left eye ([place]) 08/27/2019  . Posterior vitreous detachment of right eye 08/27/2019  . Severe nonproliferative diabetic retinopathy of right eye ([place]) 08/27/2019  . Frequency of urination and polyuria 01/29/2019  . Coronary artery disease involving native coronary artery of native heart without angina pectoris 01/27/2019  . Microalbuminuria 12/24/2018  . COPD (chronic obstructive pulmonary disease) ([place]) 06/25/2018  . Thrombocytopenia ([place]) 05/09/2018  . Diabetic neuropathy ([place]) 01/30/2018  . S/P AVR (aortic valve replacement) 11/04/2016  . Atrial  fibrillation ([place]) 09/27/2016  . Symptomatic bradycardia 06/12/2016  . Urgency incontinence 11/10/2015  . Erectile dysfunction 11/10/2015  . Hypothyroidism 01/15/2015  . 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 Phimosis 01/15/2015  . CHF (congestive heart failure) ([place]) 01/01/2015  . B12 deficiency 04/08/2014  . 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 Type 2 diabetes mellitus with diabetic neuropathy, unspecified ([place]) 03/12/2014  . 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 GERD (gastroesophageal reflux disease) 11/02/2012  . Obesity 08/04/2010  . Benign prostatic hyperplasia 04/15/2010  . CAROTID OCCLUSIVE DISEASE 01/26/2010  . MURMUR 05/27/2009  . Aortic valve disease 05/27/2009  . Hyperlipidemia with target LDL less than 100 01/22/2009  . Depression 01/22/2009  . Essential hypertension 01/22/2009    Past Surgical History:  Procedure Laterality Date  . 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 APPENDECTOMY    . bilateral cataract surg    . COLONOSCOPY  04/24/2007   [name]: normal  . COLONOSCOPY WITH ESOPHAGOGASTRODUODENOSCOPY (EGD) N/A 04/05/2012   Procedure: COLONOSCOPY WITH ESOPHAGOGASTRODUODENOSCOPY (EGD);  Surgeon: Daneil Dolin, MD;  [facility];  Service: Endoscopy;  Laterality: N/A;  10;15  . DENTAL SURGERY  05/2004   Dental extractions  . EYE SURGERY Bilateral    cataract  . HEEL SPUR SURGERY Bilateral    resection of heel spur  . KNEE ARTHROSCOPY WITH LATERAL MENISECTOMY Right 04/04/2014   Procedure: KNEE ARTHROSCOPY WITH LATERAL MENISECTOMY;  Surgeon: Carole Civil, MD;  Location[facility];  Service: Orthopedics;  Laterality: Right;  .  PACEMAKER IMPLANT N/A 06/13/2016   Procedure: Pacemaker Implant- Dual Chamber;  Surgeon: Evans Lance, MD;  Location: [place] CV LAB;  Service: Cardiovascular;  Laterality: N/A;  . PACEMAKER INSERTION  2018  . THYROIDECTOMY  03/21/2013   DR [name]  . THYROIDECTOMY N/A 03/21/2013    Procedure: THYROIDECTOMY;  Surgeon: [name], MD;  Location: [place];  Service: General;  Laterality: N/A;  . TONSILLECTOMY         Home Medications    Prior to Admission medications   Medication Sig Start Date End Date Taking? Authorizing Provider  acetaminophen (TYLENOL) 500 MG tablet Take 1,000 mg by mouth every 6 (six) hours as needed (pain).    [provider]  azelastine (ASTELIN) 0.1 % nasal spray Place 1 spray into both nostrils 2 (two) times daily. 04/16/19   Janora Norlander, DO  cholecalciferol (VITAMIN D) 1000 units tablet Take 1,000 Units by mouth daily.     [provider]  doxycycline (VIBRAMYCIN) 100 MG capsule Take 1 capsule (100 mg total) by mouth 2 (two) times daily for 7 days. 11/19/19 11/26/19  Annaliz Aven C, PA-C  ELIQUIS 5 MG TABS tablet TAKE ONE TABLET BY MOUTH TWICE DAILY Patient taking differently: Take 5 mg by mouth 2 (two) times daily.  11/28/17   Dettinger, Fransisca Kaufmann, MD  furosemide (LASIX) 20 MG tablet Take 1 tablet (20 mg total) by mouth daily. 04/17/17   Timmothy Euler, MD  gabapentin (NEURONTIN) 300 MG capsule Take 1 capsule (300 mg total) by mouth 2 (two) times daily. 12/04/18   Evelina Dun A, FNP  glucose blood test strip 1 strip by Does not apply route 3 (three) times daily. Uses true metrix strips (diabetic club) 05/12/16   [provider]  insulin detemir (LEVEMIR) 100 UNIT/ML injection Inject 0.47-0.48 mLs (47-48 Units total) into the skin 2 (two) times daily. Patient taking differently: Inject 38-40 Units into the skin 2 (two) times daily.  07/04/16   Cherre Robins, PharmD  levothyroxine (SYNTHROID) 175 MCG tablet Take 1 tablet (175 mcg total) by mouth daily before breakfast. (Needs labwork) 05/27/19   Loman Brooklyn, FNP  lisinopril (ZESTRIL) 5 MG tablet Take 5 mg by mouth daily.    [provider]  metFORMIN (GLUCOPHAGE) 1000 MG tablet TAKE 1 TABLET BY MOUTH TWICE DAILY WITH A MEAL. Patient taking  differently: Take 1,000 mg by mouth in the morning and at bedtime.  07/24/15   Timmothy Euler, MD  Multiple Vitamins-Minerals (MENS MULTI VITAMIN & MINERAL PO) Take 1 tablet by mouth daily.      [provider]  phenazopyridine (PYRIDIUM) 200 MG tablet Take 1 tablet (200 mg total) by mouth 3 (three) times daily. 08/22/19   [place], [place], PA-C  Semaglutide, 1 MG/DOSE, (OZEMPIC, 1 MG/DOSE,) 2 MG/1.5ML SOPN Inject 1 mg into the skin once a week. 06/26/19   Dettinger, Fransisca Kaufmann, MD  simvastatin (ZOCOR) 80 MG tablet Take 0.5 tablets (40 mg total) by mouth at bedtime. 04/04/17   Timmothy Euler, MD  sulfamethoxazole-trimethoprim (BACTRIM DS) 800-160 MG tablet Take 1 tablet by mouth 2 (two) times daily. 11/05/19   Dettinger, Fransisca Kaufmann, MD  tamsulosin (FLOMAX) 0.4 MG CAPS capsule Take 2 capsules (0.8 mg total) by mouth at bedtime. 10/24/19   Dettinger, Fransisca Kaufmann, MD  traMADol (ULTRAM) 50 MG tablet Take 50 mg by mouth 2 (two) times daily. 08/09/18   [provider]  triamcinolone cream (KENALOG) 0.1 % Apply topically  2 (two) times daily. 06/18/19   Dettinger, Fransisca Kaufmann, MD    Family History Family History  Problem Relation Age of Onset  . Heart disease Father   . Congestive Heart Failure Father   . Arthritis Father   . Diabetes Other   . Benign prostatic hyperplasia Brother     Social History Social History   Tobacco Use  . Smoking status: Former Smoker    Packs/day: 0.00    Years: 12.00    Pack years: 0.00    Types: Cigarettes    Quit date: 09/20/1961    Years since quitting: 58.2  . Smokeless tobacco: Never Used  Vaping Use  . Vaping Use: Never used  Substance Use Topics  . Alcohol use: Not Currently  . Drug use: No     Allergies   Phenothiazines and Actos [pioglitazone]   Review of Systems Review of Systems  Constitutional: Negative for fatigue and fever.  Eyes: Negative for redness, itching and visual disturbance.  Respiratory: Negative for shortness of  breath.   Cardiovascular: Negative for chest pain and leg swelling.  Gastrointestinal: Negative for nausea and vomiting.  Musculoskeletal: Positive for arthralgias and joint swelling. Negative for myalgias.  Skin: Positive for color change and wound. Negative for rash.  Neurological: Negative for dizziness, syncope, weakness, light-headedness and headaches.     Physical Exam Triage Vital Signs ED Triage Vitals  Enc Vitals Group     BP 11/19/19 1132 107/62     Pulse Rate 11/19/19 1132 65     Resp 11/19/19 1132 18     Temp 11/19/19 1132 97.8 F (36.6 C)     Temp Source 11/19/19 1132 Oral     SpO2 11/19/19 1132 98 %     Weight 11/19/19 1133 264 lb 8.8 oz (120 kg)     Height 11/19/19 1133 6\' 1"  (1.854 m)     Head Circumference --      Peak Flow --      Pain Score --      Pain Loc --      Pain Edu? --      Excl. in [place]? --    No data found.  Updated Vital Signs BP 107/62 (BP Location: Right Arm)   Pulse 65   Temp 97.8 F (36.6 C) (Oral)   Resp 18   Ht 6\' 1"  (1.854 m)   Wt 264 lb 8.8 oz (120 kg)   SpO2 98%   BMI 34.90 kg/m   Visual Acuity Right Eye Distance:   Left Eye Distance:   Bilateral Distance:    Right Eye Near:   Left Eye Near:    Bilateral Near:     Physical Exam Vitals and nursing note reviewed.  Constitutional:      Appearance: He is well-developed.     Comments: No acute distress  HENT:     Head: Normocephalic and atraumatic.     Nose: Nose normal.  Eyes:     Conjunctiva/sclera: Conjunctivae normal.  Cardiovascular:     Rate and Rhythm: Normal rate.  Pulmonary:     Effort: Pulmonary effort is normal. No respiratory distress.  Abdominal:     General: There is no distension.  Musculoskeletal:        General: Normal range of motion.     Cervical back: Neck supple.     Comments: Left foot: Distal foot with circular open wound with surrounding increased erythema swelling and ecchymosis noted throughout dorsum of foot Edema 2+ throughout  dorsum  of foot Dorsalis pedis 2+  Skin:    General: Skin is warm and dry.  Neurological:     Mental Status: He is alert and oriented to person, place, and time.      UC Treatments / Results  Labs (all labs ordered are listed, but only abnormal results are displayed) Labs Reviewed - No data to display  EKG   Radiology No results found.  Procedures Procedures (including critical care time)  Medications Ordered in UC Medications - No data to display  Initial Impression / Assessment and Plan / UC Course  I have reviewed the triage vital signs and the nursing notes.  Pertinent labs & imaging results that were available during my care of the patient were reviewed by me and considered in my medical decision making (see chart for details).     Wound concerning for cellulitis, initiated on doxycycline, placed nonadherent with Ace wrap to further help with swelling, keep clean and dry, elevate foot at home, prior imaging negative, will not repeat today.  No systemic symptoms, vital signs stable.  Advised close monitoring of wound and ensure healing.  Follow-up if not improving or worsening.  Discussed strict return precautions. Patient verbalized understanding and is agreeable with plan.  Final Clinical Impressions(s) / UC Diagnoses   Final diagnoses:  Cellulitis of left foot     Discharge Instructions     Begin doxycycline twice daily for 1 week Elevate leg to help with circulation and swelling Tylenol for pain Keep wound clean and dry Please follow-up here or emergency room if developing any worsening pain swelling drainage or wound getting deeper/bigger, fevers    ED Prescriptions    Medication Sig Dispense Auth. Provider   doxycycline (VIBRAMYCIN) 100 MG capsule Take 1 capsule (100 mg total) by mouth 2 (two) times daily for 7 days. 14 capsule Keneshia Tena, [place] C, PA-C     PDMP not reviewed this encounter.   Joneen Caraway [place] C, PA-C 11/19/19 1229

## 2019-11-19 NOTE — Discharge Instructions (Signed)
Begin doxycycline twice daily for 1 week Elevate leg to help with circulation and swelling Tylenol for pain Keep wound clean and dry Please follow-up here or emergency room if developing any worsening pain swelling drainage or wound getting deeper/bigger, fevers

## 2019-12-09 ENCOUNTER — Telehealth: Payer: Self-pay

## 2019-12-09 NOTE — Telephone Encounter (Signed)
[facility] called stating that pt is trying to get a refill on his Furosemide Rx. Says he normally gets it through the [place] but wanted to buy some from [facility].  (Looks like the last provider here who prescribed this to pt was Dr Wendi Snipes in 2019)  Please advise.

## 2019-12-10 NOTE — Telephone Encounter (Signed)
Spoke with patient. He states that he does not need Dettinger to send in Furosemide. Pt states that he will continue getting the medication through the [place]

## 2019-12-10 NOTE — Telephone Encounter (Signed)
I do not even know for sure if we have him on the same dose or not, if he is still taking the furosemide 20 mg daily then we can send him a months worth with 1 refill like was done previously, will have to call patient and make sure that this is the correct dose or call the pharmacy and make sure.  But I am okay with sending him a couple months worth.

## 2019-12-15 ENCOUNTER — Other Ambulatory Visit: Payer: Self-pay | Admitting: Family

## 2019-12-15 DIAGNOSIS — E1149 Type 2 diabetes mellitus with other diabetic neurological complication: Secondary | ICD-10-CM

## 2019-12-15 DIAGNOSIS — E1142 Type 2 diabetes mellitus with diabetic polyneuropathy: Secondary | ICD-10-CM

## 2019-12-21 NOTE — Progress Notes (Deleted)
Referring Provider: Dettinger, Fransisca Kaufmann, MD  Primary Care Physician:  Dettinger, Fransisca Kaufmann, MD Primary Gastroenterologist:  Dr. Gala Romney  No chief complaint on file.   HPI:   [name] is a 74 y.o. male presenting today at the request of Dettinger, Fransisca Kaufmann, MD to discuss scheduling colonoscopy. Last colonoscopy in March 2014 with normal exam. Recommended repeat in 5 years due to history of colon polys. EGD also performed in March 2014 due anemia with reported melena and heme positive stool revealing normal esophagus, inflamed appearing gastric mucosa s/p biopsy, small duodenal bulbar ulcer with associated erosions. Pathology with mildly active inflammation  no H. Pylori. Follow-up H. Pylori IgM was negative. Patient hasn't been seen since 2014. Last hemoglobin in June 2021 stable at 12.9.     Past Medical History:  Diagnosis Date  . Arthritis   . Atrial fibrillation ([place])   . BPH (benign prostatic hypertrophy) 04/15/2010  . Cataract   . Colon polyps   . DEPRESSION 01/22/2009  . Heart valve replaced   . HYPERCHOLESTEROLEMIA 01/22/2009  . HYPERTENSION 01/22/2009   Dr. [name]  . 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 Morbid obesity ([place])   . Tremors of nervous system    ?ptsd  . Ulcer     Past Surgical History:  Procedure Laterality Date  . 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 APPENDECTOMY    . bilateral cataract surg    . COLONOSCOPY  04/24/2007   [name]: normal  . COLONOSCOPY WITH ESOPHAGOGASTRODUODENOSCOPY (EGD) N/A 04/05/2012   Procedure: COLONOSCOPY WITH ESOPHAGOGASTRODUODENOSCOPY (EGD);  Surgeon: Daneil Dolin, MD;  [facility];  Service: Endoscopy;  Laterality: N/A;  10;15  . DENTAL SURGERY  05/2004   Dental extractions  . EYE SURGERY Bilateral    cataract  . HEEL SPUR SURGERY Bilateral    resection of heel spur  . KNEE ARTHROSCOPY WITH LATERAL MENISECTOMY Right 04/04/2014    Procedure: KNEE ARTHROSCOPY WITH LATERAL MENISECTOMY;  Surgeon: Carole Civil, MD;  Location: [facility];  Service: Orthopedics;  Laterality: Right;  . PACEMAKER IMPLANT N/A 06/13/2016   Procedure: Pacemaker Implant- Dual Chamber;  Surgeon: Evans Lance, MD;  Location: [facility];  Service: Cardiovascular;  Laterality: N/A;  . PACEMAKER INSERTION  2018  . THYROIDECTOMY  03/21/2013   DR [name]  . THYROIDECTOMY N/A 03/21/2013   Procedure: THYROIDECTOMY;  Surgeon: [name], MD;  Location: [facility];  Service: General;  Laterality: N/A;  . TONSILLECTOMY      Current Outpatient Medications  Medication Sig Dispense Refill  . acetaminophen (TYLENOL) 500 MG tablet Take 1,000 mg by mouth every 6 (six) hours as needed (pain).    Marland Kitchen azelastine (ASTELIN) 0.1 % nasal spray Place 1 spray into both nostrils 2 (two) times daily. 30 mL 12  . cholecalciferol (VITAMIN D) 1000 units tablet Take 1,000 Units by mouth daily.     Marland Kitchen ELIQUIS 5 MG TABS tablet TAKE ONE TABLET BY MOUTH TWICE DAILY (Patient taking differently: Take 5 mg by mouth 2 (two) times daily. ) 60 tablet 5  . furosemide (LASIX) 20 MG tablet Take 1 tablet (20 mg total) by mouth daily. 30 tablet 1  . gabapentin (NEURONTIN) 300 MG capsule TAKE ONE CAPSULE BY MOUTH TWICE DAILY 180 capsule 0  . glucose blood test strip 1 strip by Does not apply route 3 (three) times daily. Uses true metrix strips (diabetic  club)  12  . insulin detemir (LEVEMIR) 100 UNIT/ML injection Inject 0.47-0.48 mLs (47-48 Units total) into the skin 2 (two) times daily. (Patient taking differently: Inject 38-40 Units into the skin 2 (two) times daily. ) 10 mL   . levothyroxine (SYNTHROID) 175 MCG tablet Take 1 tablet (175 mcg total) by mouth daily before breakfast. (Needs labwork) 30 tablet 0  . lisinopril (ZESTRIL) 5 MG tablet Take 5 mg by mouth daily.    . metFORMIN (GLUCOPHAGE) 1000 MG tablet TAKE 1 TABLET BY MOUTH TWICE DAILY WITH A MEAL. (Patient taking differently:  Take 1,000 mg by mouth in the morning and at bedtime. ) 180 tablet 0  . Multiple Vitamins-Minerals (MENS MULTI VITAMIN & MINERAL PO) Take 1 tablet by mouth daily.      . phenazopyridine (PYRIDIUM) 200 MG tablet Take 1 tablet (200 mg total) by mouth 3 (three) times daily. 6 tablet 0  . Semaglutide, 1 MG/DOSE, (OZEMPIC, 1 MG/DOSE,) 2 MG/1.5ML SOPN Inject 1 mg into the skin once a week. 4 pen 3  . simvastatin (ZOCOR) 80 MG tablet Take 0.5 tablets (40 mg total) by mouth at bedtime. 15 tablet 0  . sulfamethoxazole-trimethoprim (BACTRIM DS) 800-160 MG tablet Take 1 tablet by mouth 2 (two) times daily. 20 tablet 0  . tamsulosin (FLOMAX) 0.4 MG CAPS capsule Take 2 capsules (0.8 mg total) by mouth at bedtime. 180 capsule 0  . traMADol (ULTRAM) 50 MG tablet Take 50 mg by mouth 2 (two) times daily.    Marland Kitchen triamcinolone cream (KENALOG) 0.1 % Apply topically 2 (two) times daily. 30 g 1   No current facility-administered medications for this visit.    Allergies as of 12/23/2019 - Review Complete 11/19/2019  Allergen Reaction Noted  . Phenothiazines Anaphylaxis 04/15/2010  . Actos [pioglitazone] Swelling 04/03/2009    Family History  Problem Relation Age of Onset  . Heart disease Father   . Congestive Heart Failure Father   . Arthritis Father   . Diabetes Other   . Benign prostatic hyperplasia Brother     Social History   Socioeconomic History  . Marital status: Single    Spouse name: Not on file  . Number of children: 1  . Years of education: HS  . Highest education level: Not on file  Occupational History  . Occupation: Administrator, retired    Fish farm manager: RETIRED  Tobacco Use  . Smoking status: Former Smoker    Packs/day: 0.00    Years: 12.00    Pack years: 0.00    Types: Cigarettes    Quit date: 09/20/1961    Years since quitting: 58.2  . Smokeless tobacco: Never Used  Vaping Use  . Vaping Use: Never used  Substance and Sexual Activity  . Alcohol use: Not Currently  . Drug use: No   . Sexual activity: Never    Comment: Has not smoked in 20 years  Other Topics Concern  . Not on file  Social History Narrative   Lives alone.   Quit smoking approximately 28 years ago.   Long haul truck driver, lives in [place].   Social Determinants of Health   Financial Resource Strain:   . Difficulty of Paying Living Expenses: Not on file  Food Insecurity:   . Worried About Charity fundraiser in the Last Year: Not on file  . Ran Out of Food in the Last Year: Not on file  Transportation Needs:   . Lack of Transportation (Medical): Not on file  .  Lack of Transportation (Non-Medical): Not on file  Physical Activity:   . Days of Exercise per Week: Not on file  . Minutes of Exercise per Session: Not on file  Stress:   . Feeling of Stress : Not on file  Social Connections:   . Frequency of Communication with Friends and Family: Not on file  . Frequency of Social Gatherings with Friends and Family: Not on file  . Attends Religious Services: Not on file  . Active Member of Clubs or Organizations: Not on file  . Attends Archivist Meetings: Not on file  . Marital Status: Not on file  Intimate Partner Violence:   . Fear of Current or Ex-Partner: Not on file  . Emotionally Abused: Not on file  . Physically Abused: Not on file  . Sexually Abused: Not on file    Review of Systems: Gen: Denies any fever, chills, fatigue, weight loss, lack of appetite.  CV: Denies chest pain, heart palpitations, peripheral edema, syncope.  Resp: Denies shortness of breath at rest or with exertion. Denies wheezing or cough.  GI: Denies dysphagia or odynophagia. Denies jaundice, hematemesis, fecal incontinence. GU : Denies urinary burning, urinary frequency, urinary hesitancy MS: Denies joint pain, muscle weakness, cramps, or limitation of movement.  Derm: Denies rash, itching, dry skin Psych: Denies depression, anxiety, memory loss, and confusion Heme: Denies bruising, bleeding, and  enlarged lymph nodes.  Physical Exam: There were no vitals taken for this visit. General:   Alert and oriented. Pleasant and cooperative. Well-nourished and well-developed.  Head:  Normocephalic and atraumatic. Eyes:  Without icterus, sclera clear and conjunctiva pink.  Ears:  Normal auditory acuity. Nose:  No deformity, discharge,  or lesions. Mouth:  No deformity or lesions, oral mucosa pink.  Neck:  Supple, without mass or thyromegaly. Lungs:  Clear to auscultation bilaterally. No wheezes, rales, or rhonchi. No distress.  Heart:  S1, S2 present without murmurs appreciated.  Abdomen:  +BS, soft, non-tender and non-distended. No HSM noted. No guarding or rebound. No masses appreciated.  Rectal:  Deferred  Msk:  Symmetrical without gross deformities. Normal posture. Pulses:  Normal pulses noted. Extremities:  Without clubbing or edema. Neurologic:  Alert and  oriented x4;  grossly normal neurologically. Skin:  Intact without significant lesions or rashes. Cervical Nodes:  No significant cervical adenopathy. Psych:  Alert and cooperative. Normal mood and affect.

## 2019-12-23 ENCOUNTER — Ambulatory Visit: Payer: Medicare Other | Admitting: Gastroenterology

## 2019-12-23 ENCOUNTER — Telehealth: Payer: Self-pay

## 2019-12-23 ENCOUNTER — Encounter: Payer: Self-pay | Admitting: Internal Medicine

## 2019-12-23 NOTE — Telephone Encounter (Signed)
FYI: Pt called to see if Dr Lajuana Ripple had any openings tomorrow because he believes he has a chest cold. Explained to pt that currently all of the regular appt slots are taken and offered him to speak with triage nurse regarding his symptoms since one of his symptoms is chest pain. Pt was very rude with me (as he is every time he calls) and ended up hanging up the phone on me.

## 2019-12-23 NOTE — Telephone Encounter (Signed)
Okay thanks for the information 

## 2019-12-24 ENCOUNTER — Ambulatory Visit: Payer: Medicare Other | Admitting: Family Medicine

## 2020-01-01 ENCOUNTER — Other Ambulatory Visit (HOSPITAL_COMMUNITY): Payer: Medicare Other

## 2020-01-01 ENCOUNTER — Ambulatory Visit: Payer: Medicare Other | Admitting: Orthopedic Surgery

## 2020-01-03 ENCOUNTER — Encounter: Payer: Self-pay | Admitting: Nurse Practitioner

## 2020-01-03 ENCOUNTER — Ambulatory Visit (INDEPENDENT_AMBULATORY_CARE_PROVIDER_SITE_OTHER): Payer: Medicare Other | Admitting: Nurse Practitioner

## 2020-01-03 ENCOUNTER — Other Ambulatory Visit: Payer: Self-pay

## 2020-01-03 VITALS — BP 146/67 | HR 68 | Temp 97.7°F | Ht 73.0 in | Wt 266.4 lb

## 2020-01-03 DIAGNOSIS — R21 Rash and other nonspecific skin eruption: Secondary | ICD-10-CM | POA: Insufficient documentation

## 2020-01-03 MED ORDER — TRIAMCINOLONE ACETONIDE 0.1 % EX CREA
1.0000 | TOPICAL_CREAM | Freq: Two times a day (BID) | CUTANEOUS | 2 refills | Status: DC
Start: 2020-01-03 — End: 2020-03-05

## 2020-01-03 NOTE — Assessment & Plan Note (Signed)
Patient is being seen for rash on right lower leg.  This is new for patient in the last 7 days.  Advised patient to keep.  Clean and dry, Kenalog 1% cream ordered.  Education provided with printed instructions given.  Follow-up with worsening or unresolved symptoms. Rx sent to pharmacy.

## 2020-01-03 NOTE — Progress Notes (Signed)
Acute Office Visit  Subjective:    Patient ID: [name], male    DOB: 09-02-1945, 74 y.o.   MRN: [id number]  Chief Complaint  Patient presents with  . Abdominal Pain    Right lower leg     Rash This is a new problem. The current episode started 1 to 4 weeks ago. The problem is unchanged. The affected locations include the right lower leg. The rash is characterized by dryness, itchiness and redness. He was exposed to nothing. Pertinent negatives include no congestion, cough, fatigue, fever or shortness of breath. Past treatments include nothing.     Past Medical History:  Diagnosis Date  . Arthritis   . Atrial fibrillation ([place])   . BPH (benign prostatic hypertrophy) 04/15/2010  . Cataract   . Colon polyps   . DEPRESSION 01/22/2009  . Heart valve replaced   . HYPERCHOLESTEROLEMIA 01/22/2009  . HYPERTENSION 01/22/2009   Dr. [name]  . 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 Morbid obesity ([place])   . Tremors of nervous system    ?ptsd  . Ulcer     Past Surgical History:  Procedure Laterality Date  . 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 APPENDECTOMY    . bilateral cataract surg    . COLONOSCOPY  04/24/2007   [name]: normal  . COLONOSCOPY WITH ESOPHAGOGASTRODUODENOSCOPY (EGD) N/A 04/05/2012   Procedure: COLONOSCOPY WITH ESOPHAGOGASTRODUODENOSCOPY (EGD);  Surgeon: Daneil Dolin, MD;  [facility];  Service: Endoscopy;  Laterality: N/A;  10;15  . DENTAL SURGERY  05/2004   Dental extractions  . EYE SURGERY Bilateral    cataract  . HEEL SPUR SURGERY Bilateral    resection of heel spur  . KNEE ARTHROSCOPY WITH LATERAL MENISECTOMY Right 04/04/2014   Procedure: KNEE ARTHROSCOPY WITH LATERAL MENISECTOMY;  Surgeon: Carole Civil, MD;  Location: [facility];  Service: Orthopedics;  Laterality: Right;  . PACEMAKER IMPLANT N/A 06/13/2016   Procedure: Pacemaker Implant- Dual Chamber;  Surgeon:  Evans Lance, MD;  Location: [facility];  Service: Cardiovascular;  Laterality: N/A;  . PACEMAKER INSERTION  2018  . THYROIDECTOMY  03/21/2013   DR [name]  . THYROIDECTOMY N/A 03/21/2013   Procedure: THYROIDECTOMY;  Surgeon: [name], MD;  Location: [facility];  Service: General;  Laterality: N/A;  . TONSILLECTOMY      Family History  Problem Relation Age of Onset  . Heart disease Father   . Congestive Heart Failure Father   . Arthritis Father   . Diabetes Other   . Benign prostatic hyperplasia Brother     Social History   Socioeconomic History  . Marital status: Single    Spouse name: Not on file  . Number of children: 1  . Years of education: HS  . Highest education level: Not on file  Occupational History  . Occupation: Administrator, retired    Fish farm manager: RETIRED  Tobacco Use  . Smoking status: Former Smoker    Packs/day: 0.00    Years: 12.00    Pack years: 0.00    Types: Cigarettes    Quit date: 09/20/1961    Years since quitting: 58.3  . Smokeless tobacco: Never Used  Vaping Use  . Vaping Use: Never used  Substance and Sexual Activity  . Alcohol use: Not Currently  . Drug use: No  . Sexual activity: Never    Comment: Has not smoked in 20 years  Other Topics Concern  . Not on file  Social History Narrative   Lives alone.   Quit smoking approximately 28 years ago.   Long haul truck driver, lives in [place].   Social Determinants of Health   Financial Resource Strain: Not on file  Food Insecurity: Not on file  Transportation Needs: Not on file  Physical Activity: Not on file  Stress: Not on file  Social Connections: Not on file  Intimate Partner Violence: Not on file    Outpatient Medications Prior to Visit  Medication Sig Dispense Refill  . acetaminophen (TYLENOL) 500 MG tablet Take 1,000 mg by mouth every 6 (six) hours as needed (pain).    Marland Kitchen azelastine (ASTELIN) 0.1 % nasal spray Place 1 spray into both nostrils 2 (two) times daily. 30  mL 12  . cholecalciferol (VITAMIN D) 1000 units tablet Take 1,000 Units by mouth daily.     Marland Kitchen ELIQUIS 5 MG TABS tablet TAKE ONE TABLET BY MOUTH TWICE DAILY (Patient taking differently: Take 5 mg by mouth 2 (two) times daily.) 60 tablet 5  . furosemide (LASIX) 20 MG tablet Take 1 tablet (20 mg total) by mouth daily. 30 tablet 1  . gabapentin (NEURONTIN) 300 MG capsule TAKE ONE CAPSULE BY MOUTH TWICE DAILY 180 capsule 0  . glucose blood test strip 1 strip by Does not apply route 3 (three) times daily. Uses true metrix strips (diabetic club)  12  . insulin detemir (LEVEMIR) 100 UNIT/ML injection Inject 0.47-0.48 mLs (47-48 Units total) into the skin 2 (two) times daily. (Patient taking differently: Inject 38-40 Units into the skin 2 (two) times daily.) 10 mL   . levothyroxine (SYNTHROID) 175 MCG tablet Take 1 tablet (175 mcg total) by mouth daily before breakfast. (Needs labwork) 30 tablet 0  . lisinopril (ZESTRIL) 5 MG tablet Take 5 mg by mouth daily.    . metFORMIN (GLUCOPHAGE) 1000 MG tablet TAKE 1 TABLET BY MOUTH TWICE DAILY WITH A MEAL. (Patient taking differently: Take 1,000 mg by mouth in the morning and at bedtime.) 180 tablet 0  . Multiple Vitamins-Minerals (MENS MULTI VITAMIN & MINERAL PO) Take 1 tablet by mouth daily.      . phenazopyridine (PYRIDIUM) 200 MG tablet Take 1 tablet (200 mg total) by mouth 3 (three) times daily. 6 tablet 0  . Semaglutide, 1 MG/DOSE, (OZEMPIC, 1 MG/DOSE,) 2 MG/1.5ML SOPN Inject 1 mg into the skin once a week. 4 pen 3  . simvastatin (ZOCOR) 80 MG tablet Take 0.5 tablets (40 mg total) by mouth at bedtime. 15 tablet 0  . sulfamethoxazole-trimethoprim (BACTRIM DS) 800-160 MG tablet Take 1 tablet by mouth 2 (two) times daily. 20 tablet 0  . tamsulosin (FLOMAX) 0.4 MG CAPS capsule Take 2 capsules (0.8 mg total) by mouth at bedtime. 180 capsule 0  . traMADol (ULTRAM) 50 MG tablet Take 50 mg by mouth 2 (two) times daily.    Marland Kitchen triamcinolone cream (KENALOG) 0.1 % Apply  topically 2 (two) times daily. 30 g 1   No facility-administered medications prior to visit.    Allergies  Allergen Reactions  . Phenothiazines Anaphylaxis  . Actos [Pioglitazone] Swelling    Review of Systems  Constitutional: Negative for fatigue and fever.  HENT: Negative for congestion.   Respiratory: Negative for cough and shortness of breath.   Skin: Positive for color change and rash.  All other systems reviewed and are negative.      Objective:    Physical Exam Vitals reviewed.  Constitutional:  Appearance: He is well-developed. He is obese.  HENT:     Head: Normocephalic.  Cardiovascular:     Rate and Rhythm: Normal rate and regular rhythm.     Heart sounds: Normal heart sounds.  Pulmonary:     Effort: Pulmonary effort is normal.     Breath sounds: Normal breath sounds.  Abdominal:     General: Bowel sounds are normal.  Musculoskeletal:        General: Normal range of motion.  Skin:    General: Skin is warm.     Findings: Erythema and rash present.  Neurological:     Mental Status: He is alert and oriented to person, place, and time.  Psychiatric:        Mood and Affect: Mood normal.        Behavior: Behavior normal.     BP (!) 146/67   Pulse 68   Temp 97.7 F (36.5 C)   Ht 6\' 1"  (1.854 m)   Wt 266 lb 6.4 oz (120.8 kg)   SpO2 97%   BMI 35.15 kg/m  Wt Readings from Last 3 Encounters:  01/03/20 266 lb 6.4 oz (120.8 kg)  11/19/19 264 lb 8.8 oz (120 kg)  11/13/19 266 lb (120.7 kg)    Health Maintenance Due  Topic Date Due  . OPHTHALMOLOGY EXAM  08/06/2019    There are no preventive care reminders to display for this patient.   Lab Results  Component Value Date   TSH 1.610 10/30/2019   Lab Results  Component Value Date   WBC 6.5 07/08/2019   HGB 12.9 (L) 07/08/2019   HCT 39.6 07/08/2019   MCV 101.3 (H) 07/08/2019   PLT 106 (L) 07/08/2019   Lab Results  Component Value Date   NA 136 10/30/2019   K 4.6 10/30/2019   CO2 24  10/30/2019   GLUCOSE 203 (H) 10/30/2019   BUN 20 10/30/2019   CREATININE 1.32 (H) 10/30/2019   BILITOT 0.4 07/08/2019   ALKPHOS 54 07/08/2019   AST 22 07/08/2019   ALT 24 07/08/2019   PROT 7.1 07/08/2019   ALBUMIN 4.1 07/08/2019   CALCIUM 9.0 10/30/2019   ANIONGAP 9 07/08/2019   Lab Results  Component Value Date   CHOL 126 06/26/2019   Lab Results  Component Value Date   HDL 49 06/26/2019   Lab Results  Component Value Date   LDLCALC 64 06/26/2019   Lab Results  Component Value Date   TRIG 58 06/26/2019   Lab Results  Component Value Date   CHOLHDL 2.6 06/26/2019   Lab Results  Component Value Date   HGBA1C 7.3 (H) 10/02/2019       Assessment & Plan:   Problem List Items Addressed This Visit      Musculoskeletal and Integument   Rash - Primary    Patient is being seen for rash on right lower leg.  This is new for patient in the last 7 days.  Advised patient to keep.  Clean and dry, Kenalog 1% cream ordered.  Education provided with printed instructions given.  Follow-up with worsening or unresolved symptoms. Rx sent to pharmacy.          Meds ordered this encounter  Medications  . triamcinolone (KENALOG) 0.1 %    Sig: Apply 1 application topically 2 (two) times daily.    Dispense:  30 g    Refill:  2    Order Specific Question:   Supervising Provider    Answer:  DETTINGER, JOSHUA A [[id number]     Ivy Lynn, NP

## 2020-01-03 NOTE — Patient Instructions (Signed)
Rash, Adult  A rash is a change in the color of your skin. A rash can also change the way your skin feels. There are many different conditions and factors that can cause a rash. Follow these instructions at home: The goal of treatment is to stop the itching and keep the rash from spreading. Watch for any changes in your symptoms. Let your doctor know about them. Follow these instructions to help with your condition: Medicine Take or apply over-the-counter and prescription medicines only as told by your doctor. These may include medicines:  To treat red or swollen skin (corticosteroid creams).  To treat itching.  To treat an allergy (oral antihistamines).  To treat very bad symptoms (oral corticosteroids).  Skin care  Put cool cloths (compresses) on the affected areas.  Do not scratch or rub your skin.  Avoid covering the rash. Make sure that the rash is exposed to air as much as possible. Managing itching and discomfort  Avoid hot showers or baths. These can make itching worse. A cold shower may help.  Try taking a bath with: ? Epsom salts. You can get these at your local pharmacy or grocery store. Follow the instructions on the package. ? Baking soda. Pour a small amount into the bath as told by your doctor. ? Colloidal oatmeal. You can get this at your local pharmacy or grocery store. Follow the instructions on the package.  Try putting baking soda paste onto your skin. Stir water into baking soda until it gets like a paste.  Try putting on a lotion that relieves itchiness (calamine lotion).  Keep cool and out of the sun. Sweating and being hot can make itching worse. General instructions   Rest as needed.  Drink enough fluid to keep your pee (urine) pale yellow.  Wear loose-fitting clothing.  Avoid scented soaps, detergents, and perfumes. Use gentle soaps, detergents, perfumes, and other cosmetic products.  Avoid anything that causes your rash. Keep a journal to  help track what causes your rash. Write down: ? What you eat. ? What cosmetic products you use. ? What you drink. ? What you wear. This includes jewelry.  Keep all follow-up visits as told by your doctor. This is important. Contact a doctor if:  You sweat at night.  You lose weight.  You pee (urinate) more than normal.  You pee less than normal, or you notice that your pee is a darker color than normal.  You feel weak.  You throw up (vomit).  Your skin or the whites of your eyes look yellow (jaundice).  Your skin: ? Tingles. ? Is numb.  Your rash: ? Does not go away after a few days. ? Gets worse.  You are: ? More thirsty than normal. ? More tired than normal.  You have: ? New symptoms. ? Pain in your belly (abdomen). ? A fever. ? Watery poop (diarrhea). Get help right away if:  You have a fever and your symptoms suddenly get worse.  You start to feel mixed up (confused).  You have a very bad headache or a stiff neck.  You have very bad joint pains or stiffness.  You have jerky movements that you cannot control (seizure).  Your rash covers all or most of your body. The rash may or may not be painful.  You have blisters that: ? Are on top of the rash. ? Grow larger. ? Grow together. ? Are painful. ? Are inside your nose or mouth.  You have a rash   that: ? Looks like purple pinprick-sized spots all over your body. ? Has a "bull's eye" or looks like a target. ? Is red and painful, causes your skin to peel, and is not from being in the sun too long. Summary  A rash is a change in the color of your skin. A rash can also change the way your skin feels.  The goal of treatment is to stop the itching and keep the rash from spreading.  Take or apply over-the-counter and prescription medicines only as told by your doctor.  Contact a doctor if you have new symptoms or symptoms that get worse.  Keep all follow-up visits as told by your doctor. This is  important. This information is not intended to replace advice given to you by your health care provider. Make sure you discuss any questions you have with your health care provider. Document Revised: 05/04/2018 Document Reviewed: 08/14/2017 Elsevier Patient Education  2020 Elsevier Inc.  

## 2020-01-07 ENCOUNTER — Telehealth: Payer: Self-pay | Admitting: Cardiology

## 2020-01-07 NOTE — Telephone Encounter (Signed)
New message:     Patient calling to see if his transmission came threw.

## 2020-01-08 ENCOUNTER — Ambulatory Visit (HOSPITAL_COMMUNITY): Payer: Medicare Other | Admitting: Nurse Practitioner

## 2020-01-08 NOTE — Telephone Encounter (Signed)
Attempted to return pt phone call, no answer, no VM.  Transmission was received 12/13, normal function.

## 2020-01-10 ENCOUNTER — Other Ambulatory Visit: Payer: Self-pay | Admitting: Family

## 2020-01-10 DIAGNOSIS — J069 Acute upper respiratory infection, unspecified: Secondary | ICD-10-CM

## 2020-01-13 NOTE — Telephone Encounter (Signed)
Patient notified monitor shows disconnected and transmission not received. Patient will send another transmission tomorrow morning when he as at home.

## 2020-01-14 ENCOUNTER — Ambulatory Visit (INDEPENDENT_AMBULATORY_CARE_PROVIDER_SITE_OTHER): Payer: Medicare Other

## 2020-01-14 DIAGNOSIS — I442 Atrioventricular block, complete: Secondary | ICD-10-CM | POA: Diagnosis not present

## 2020-01-14 LAB — CUP PACEART REMOTE DEVICE CHECK
Battery Remaining Longevity: 93 mo
Battery Voltage: 2.99 V
Brady Statistic AP VP Percent: 6.17 %
Brady Statistic AP VS Percent: 0 %
Brady Statistic AS VP Percent: 93.81 %
Brady Statistic AS VS Percent: 0.02 %
Brady Statistic RA Percent Paced: 6.16 %
Brady Statistic RV Percent Paced: 99.98 %
Date Time Interrogation Session: 20211221094631
Implantable Lead Implant Date: 20180521
Implantable Lead Implant Date: 20180521
Implantable Lead Location: 753859
Implantable Lead Location: 753859
Implantable Lead Model: 3830
Implantable Lead Model: 5076
Implantable Pulse Generator Implant Date: 20180521
Lead Channel Impedance Value: 304 Ohm
Lead Channel Impedance Value: 323 Ohm
Lead Channel Impedance Value: 399 Ohm
Lead Channel Impedance Value: 418 Ohm
Lead Channel Pacing Threshold Amplitude: 0.75 V
Lead Channel Pacing Threshold Amplitude: 0.875 V
Lead Channel Pacing Threshold Pulse Width: 0.4 ms
Lead Channel Pacing Threshold Pulse Width: 0.4 ms
Lead Channel Sensing Intrinsic Amplitude: 1.25 mV
Lead Channel Sensing Intrinsic Amplitude: 1.25 mV
Lead Channel Sensing Intrinsic Amplitude: 19.625 mV
Lead Channel Sensing Intrinsic Amplitude: 19.625 mV
Lead Channel Setting Pacing Amplitude: 1.5 V
Lead Channel Setting Pacing Amplitude: 2.5 V
Lead Channel Setting Pacing Pulse Width: 0.4 ms
Lead Channel Setting Sensing Sensitivity: 2 mV

## 2020-01-14 NOTE — Telephone Encounter (Signed)
Transmission received.

## 2020-01-14 NOTE — Telephone Encounter (Signed)
Patient contacted by cell phone, spoke with patient informing him that his monitor transmission was received. Patient informed of normal device function with no episodes. Patient advised to call device clinic with any questions or concerns at [phone].

## 2020-01-27 DIAGNOSIS — M25572 Pain in left ankle and joints of left foot: Secondary | ICD-10-CM | POA: Diagnosis not present

## 2020-01-27 DIAGNOSIS — M1712 Unilateral primary osteoarthritis, left knee: Secondary | ICD-10-CM | POA: Diagnosis not present

## 2020-01-28 ENCOUNTER — Ambulatory Visit: Payer: Medicare Other | Admitting: Family Medicine

## 2020-01-29 ENCOUNTER — Ambulatory Visit: Payer: Medicare Other | Admitting: Cardiology

## 2020-01-29 NOTE — Progress Notes (Signed)
Remote pacemaker transmission.   

## 2020-01-31 ENCOUNTER — Telehealth: Payer: Self-pay

## 2020-01-31 MED ORDER — APIXABAN 5 MG PO TABS: 5.0000 mg | ORAL_TABLET | Freq: Two times a day (BID) | ORAL | 0 refills | Status: DC

## 2020-01-31 NOTE — Telephone Encounter (Signed)
Spoke with patient, [facility] but he has not received yet.  Needs 30 day supply sent to [facility].  Refill done.

## 2020-01-31 NOTE — Telephone Encounter (Signed)
Pt called stating that he needs someone to send in a Rx for his Eliquis that the [place] normally prescribes to him. Says he is completely out of his medicine and the [facility] "go[facility]ed [facility]" and didn't send him refills.   Can call patient on his cell phone at [phone]

## 2020-02-03 DIAGNOSIS — M25572 Pain in left ankle and joints of left foot: Secondary | ICD-10-CM | POA: Diagnosis not present

## 2020-02-03 DIAGNOSIS — M1712 Unilateral primary osteoarthritis, left knee: Secondary | ICD-10-CM | POA: Diagnosis not present

## 2020-02-04 ENCOUNTER — Ambulatory Visit: Payer: Medicare Other | Admitting: Nurse Practitioner

## 2020-02-05 ENCOUNTER — Other Ambulatory Visit: Payer: Self-pay

## 2020-02-05 ENCOUNTER — Ambulatory Visit: Payer: Medicare Other | Admitting: Gastroenterology

## 2020-02-05 ENCOUNTER — Encounter: Payer: Self-pay | Admitting: Nurse Practitioner

## 2020-02-05 ENCOUNTER — Ambulatory Visit (INDEPENDENT_AMBULATORY_CARE_PROVIDER_SITE_OTHER): Payer: Medicare Other | Admitting: Nurse Practitioner

## 2020-02-05 VITALS — BP 172/73 | HR 71 | Temp 98.3°F | Resp 20 | Ht 73.0 in | Wt 269.0 lb

## 2020-02-05 DIAGNOSIS — R300 Dysuria: Secondary | ICD-10-CM | POA: Diagnosis not present

## 2020-02-05 LAB — URINALYSIS, COMPLETE
Bilirubin, UA: NEGATIVE
Ketones, UA: NEGATIVE
Nitrite, UA: NEGATIVE
Protein,UA: NEGATIVE
Specific Gravity, UA: 1.005 (ref 1.005–1.030)
Urobilinogen, Ur: 0.2 mg/dL (ref 0.2–1.0)
pH, UA: 5 (ref 5.0–7.5)

## 2020-02-05 LAB — MICROSCOPIC EXAMINATION

## 2020-02-05 MED ORDER — CEPHALEXIN 500 MG PO CAPS
500.0000 mg | ORAL_CAPSULE | Freq: Two times a day (BID) | ORAL | 0 refills | Status: DC
Start: 2020-02-05 — End: 2020-02-25

## 2020-02-05 MED ORDER — CEFTRIAXONE SODIUM 1 G IJ SOLR
1.0000 g | Freq: Once | INTRAMUSCULAR | Status: AC
Start: 2020-02-05 — End: 2020-02-05
  Administered 2020-02-05: 1 g via INTRAMUSCULAR

## 2020-02-05 NOTE — Progress Notes (Signed)
Acute Office Visit  Subjective:    Patient ID: [name], male    DOB: [birth year], 75 y.o.   MRN: [id number]  Chief Complaint  Patient presents with  . Urinary Tract Infection    Urinary Tract Infection  This is a new problem. The current episode started in the past 7 days. The problem occurs every urination. The problem has been unchanged. The pain is at a severity of 0/10. There has been no fever. Associated symptoms include flank pain and urgency. Pertinent negatives include no chills or vomiting. He has tried nothing for the symptoms. The treatment provided no relief.     Past Medical History:  Diagnosis Date  . Arthritis   . Atrial fibrillation ([place])   . BPH (benign prostatic hypertrophy) 04/15/2010  . Cataract   . Colon polyps   . DEPRESSION 01/22/2009  . Heart valve replaced   . HYPERCHOLESTEROLEMIA 01/22/2009  . HYPERTENSION 01/22/2009   Dr. [name]  . 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 Morbid obesity ([place])   . Tremors of nervous system    ?ptsd  . Ulcer     Past Surgical History:  Procedure Laterality Date  . 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 APPENDECTOMY    . bilateral cataract surg    . COLONOSCOPY  04/24/2007   [name]: normal  . COLONOSCOPY WITH ESOPHAGOGASTRODUODENOSCOPY (EGD) N/A 04/05/2012   Procedure: COLONOSCOPY WITH ESOPHAGOGASTRODUODENOSCOPY (EGD);  Surgeon: Daneil Dolin, MD;  Location: [facility];  Service: Endoscopy;  Laterality: N/A;  10;15  . DENTAL SURGERY  05/2004   Dental extractions  . EYE SURGERY Bilateral    cataract  . HEEL SPUR SURGERY Bilateral    resection of heel spur  . KNEE ARTHROSCOPY WITH LATERAL MENISECTOMY Right 04/04/2014   Procedure: KNEE ARTHROSCOPY WITH LATERAL MENISECTOMY;  Surgeon: Carole Civil, MD;  Location: [facility];  Service: Orthopedics;  Laterality: Right;  . PACEMAKER IMPLANT N/A 06/13/2016   Procedure: Pacemaker Implant- Dual  Chamber;  Surgeon: Evans Lance, MD;  Location: [facility];  Service: Cardiovascular;  Laterality: N/A;  . PACEMAKER INSERTION  2018  . THYROIDECTOMY  03/21/2013   DR [name]  . THYROIDECTOMY N/A 03/21/2013   Procedure: THYROIDECTOMY;  Surgeon: [name], MD;  Location: [place];  Service: General;  Laterality: N/A;  . TONSILLECTOMY      Family History  Problem Relation Age of Onset  . Heart disease Father   . Congestive Heart Failure Father   . Arthritis Father   . Diabetes Other   . Benign prostatic hyperplasia Brother     Social History   Socioeconomic History  . Marital status: Single    Spouse name: Not on file  . Number of children: 1  . Years of education: HS  . Highest education level: Not on file  Occupational History  . Occupation: Administrator, retired    Fish farm manager: RETIRED  Tobacco Use  . Smoking status: Former Smoker    Packs/day: 0.00    Years: 12.00    Pack years: 0.00    Types: Cigarettes    Quit date: 09/20/1961    Years since quitting: 58.4  . Smokeless tobacco: Never Used  Vaping Use  . Vaping Use: Never used  Substance and Sexual Activity  . Alcohol use: Not Currently  . Drug use: No  . Sexual activity: Never    Comment: Has not smoked  in 20 years  Other Topics Concern  . Not on file  Social History Narrative   Lives alone.   Quit smoking approximately 28 years ago.   Long haul truck driver, lives in [place].   Social Determinants of Health   Financial Resource Strain: Not on file  Food Insecurity: Not on file  Transportation Needs: Not on file  Physical Activity: Not on file  Stress: Not on file  Social Connections: Not on file  Intimate Partner Violence: Not on file    Outpatient Medications Prior to Visit  Medication Sig Dispense Refill  . acetaminophen (TYLENOL) 500 MG tablet Take 1,000 mg by mouth every 6 (six) hours as needed (pain).    Marland Kitchen apixaban (ELIQUIS) 5 MG TABS tablet Take 1 tablet (5 mg total) by mouth 2  (two) times daily. 60 tablet 0  . azelastine (ASTELIN) 0.1 % nasal spray Place 1 spray into both nostrils 2 (two) times daily. 30 mL 12  . cholecalciferol (VITAMIN D) 1000 units tablet Take 1,000 Units by mouth daily.     . fluticasone (FLONASE) 50 MCG/ACT nasal spray INSTILL TWO SPRAYS IN EACH NOSTRIL DAILY 16 g 6  . furosemide (LASIX) 20 MG tablet Take 1 tablet (20 mg total) by mouth daily. 30 tablet 1  . gabapentin (NEURONTIN) 300 MG capsule TAKE ONE CAPSULE BY MOUTH TWICE DAILY 180 capsule 0  . glucose blood test strip 1 strip by Does not apply route 3 (three) times daily. Uses true metrix strips (diabetic club)  12  . insulin detemir (LEVEMIR) 100 UNIT/ML injection Inject 0.47-0.48 mLs (47-48 Units total) into the skin 2 (two) times daily. (Patient taking differently: Inject 38-40 Units into the skin 2 (two) times daily.) 10 mL   . levothyroxine (SYNTHROID) 175 MCG tablet Take 1 tablet (175 mcg total) by mouth daily before breakfast. (Needs labwork) 30 tablet 0  . lisinopril (ZESTRIL) 5 MG tablet Take 5 mg by mouth daily.    . metFORMIN (GLUCOPHAGE) 1000 MG tablet TAKE 1 TABLET BY MOUTH TWICE DAILY WITH A MEAL. (Patient taking differently: Take 1,000 mg by mouth in the morning and at bedtime.) 180 tablet 0  . Multiple Vitamins-Minerals (MENS MULTI VITAMIN & MINERAL PO) Take 1 tablet by mouth daily.      . Semaglutide, 1 MG/DOSE, (OZEMPIC, 1 MG/DOSE,) 2 MG/1.5ML SOPN Inject 1 mg into the skin once a week. 4 pen 3  . simvastatin (ZOCOR) 80 MG tablet Take 0.5 tablets (40 mg total) by mouth at bedtime. 15 tablet 0  . tamsulosin (FLOMAX) 0.4 MG CAPS capsule Take 2 capsules (0.8 mg total) by mouth at bedtime. 180 capsule 0  . traMADol (ULTRAM) 50 MG tablet Take 50 mg by mouth 2 (two) times daily.    Marland Kitchen triamcinolone (KENALOG) 0.1 % Apply 1 application topically 2 (two) times daily. 30 g 2  . phenazopyridine (PYRIDIUM) 200 MG tablet Take 1 tablet (200 mg total) by mouth 3 (three) times daily. 6 tablet  0  . sulfamethoxazole-trimethoprim (BACTRIM DS) 800-160 MG tablet Take 1 tablet by mouth 2 (two) times daily. 20 tablet 0   No facility-administered medications prior to visit.    Allergies  Allergen Reactions  . Phenothiazines Anaphylaxis  . Actos [Pioglitazone] Swelling    Review of Systems  Constitutional: Negative for chills.  HENT: Negative.   Respiratory: Negative.   Cardiovascular: Negative.   Gastrointestinal: Negative for vomiting.  Genitourinary: Positive for flank pain and urgency.  All other systems reviewed and are  negative.      Objective:    Physical Exam Vitals reviewed.  Constitutional:      Appearance: Normal appearance.  HENT:     Head: Normocephalic.     Nose: Nose normal.  Eyes:     Conjunctiva/sclera: Conjunctivae normal.  Cardiovascular:     Rate and Rhythm: Normal rate and regular rhythm.  Pulmonary:     Effort: Pulmonary effort is normal.     Breath sounds: Normal breath sounds.  Abdominal:     General: Bowel sounds are normal.     Tenderness: There is left CVA tenderness.  Musculoskeletal:        General: Tenderness present.  Neurological:     Mental Status: He is alert and oriented to person, place, and time.     BP (!) 172/73   Pulse 71   Temp 98.3 F (36.8 C)   Resp 20   Ht 6\' 1"  (1.854 m)   Wt 269 lb (122 kg)   SpO2 98%   BMI 35.49 kg/m  Wt Readings from Last 3 Encounters:  02/05/20 269 lb (122 kg)  01/03/20 266 lb 6.4 oz (120.8 kg)  11/19/19 264 lb 8.8 oz (120 kg)    Health Maintenance Due  Topic Date Due  . COVID-19 Vaccine (1) Never done  . OPHTHALMOLOGY EXAM  08/06/2019      Lab Results  Component Value Date   TSH 1.610 10/30/2019   Lab Results  Component Value Date   WBC 6.5 07/08/2019   HGB 12.9 (L) 07/08/2019   HCT 39.6 07/08/2019   MCV 101.3 (H) 07/08/2019   PLT 106 (L) 07/08/2019   Lab Results  Component Value Date   NA 136 10/30/2019   K 4.6 10/30/2019   CO2 24 10/30/2019   GLUCOSE 203  (H) 10/30/2019   BUN 20 10/30/2019   CREATININE 1.32 (H) 10/30/2019   BILITOT 0.4 07/08/2019   ALKPHOS 54 07/08/2019   AST 22 07/08/2019   ALT 24 07/08/2019   PROT 7.1 07/08/2019   ALBUMIN 4.1 07/08/2019   CALCIUM 9.0 10/30/2019   ANIONGAP 9 07/08/2019   Lab Results  Component Value Date   CHOL 126 06/26/2019   Lab Results  Component Value Date   HDL 49 06/26/2019   Lab Results  Component Value Date   LDLCALC 64 06/26/2019   Lab Results  Component Value Date   TRIG 58 06/26/2019   Lab Results  Component Value Date   CHOLHDL 2.6 06/26/2019   Lab Results  Component Value Date   HGBA1C 7.3 (H) 10/02/2019       Assessment & Plan:   Problem List Items Addressed This Visit   None   Visit Diagnoses    Dysuria    -  Primary   Relevant Orders   Urinalysis, Complete   Urine Culture       Meds ordered this encounter  Medications  . cephALEXin (KEFLEX) 500 MG capsule    Sig: Take 1 capsule (500 mg total) by mouth 2 (two) times daily.    Dispense:  14 capsule    Refill:  0    Order Specific Question:   Supervising Provider    Answer:   Janora Norlander [[id number]]  . cefTRIAXone (ROCEPHIN) injection 1 g     Ivy Lynn, NP

## 2020-02-05 NOTE — Assessment & Plan Note (Signed)
Symptom not well controlled, advised patient to increase hydration, Rocephin one gram shot given in office, started pt on keflex. Urinalysis and cultures completed.  Education provided to patient with printed hand out given.  Rx sent to pharmacy.  Follow up with worsening or unresolved symptoms.

## 2020-02-05 NOTE — Patient Instructions (Signed)
Gram of Rocephin in office, Keflex started, increase hydration, Tylenol or ibuprofen for pain, Follow-up with unresolved symptoms.   Urinary Tract Infection, Adult A urinary tract infection (UTI) is an infection of any part of the urinary tract. The urinary tract includes:  The kidneys.  The ureters.  The bladder.  The urethra. These organs make, store, and get rid of pee (urine) in the body. What are the causes? This infection is caused by germs (bacteria) in your genital area. These germs grow and cause swelling (inflammation) of your urinary tract. What increases the risk? The following factors may make you more likely to develop this condition:  Using a small, thin tube (catheter) to drain pee.  Not being able to control when you pee or poop (incontinence).  Being male. If you are male, these things can increase the risk: ? Using these methods to prevent pregnancy:  A medicine that kills sperm (spermicide).  A device that blocks sperm (diaphragm). ? Having low levels of a male hormone (estrogen). ? Being pregnant. You are more likely to develop this condition if:  You have genes that add to your risk.  You are sexually active.  You take antibiotic medicines.  You have trouble peeing because of: ? A prostate that is bigger than normal, if you are male. ? A blockage in the part of your body that drains pee from the bladder. ? A kidney stone. ? A nerve condition that affects your bladder. ? Not getting enough to drink. ? Not peeing often enough.  You have other conditions, such as: ? Diabetes. ? A weak disease-fighting system (immune system). ? Sickle cell disease. ? Gout. ? Injury of the spine. What are the signs or symptoms? Symptoms of this condition include:  Needing to pee right away.  Peeing small amounts often.  Pain or burning when peeing.  Blood in the pee.  Pee that smells bad or not like normal.  Trouble peeing.  Pee that is  cloudy.  Fluid coming from the vagina, if you are male.  Pain in the belly or lower back. Other symptoms include:  Vomiting.  Not feeling hungry.  Feeling mixed up (confused). This may be the first symptom in older adults.  Being tired and grouchy (irritable).  A fever.  Watery poop (diarrhea). How is this treated?  Taking antibiotic medicine.  Taking other medicines.  Drinking enough water. In some cases, you may need to see a specialist. Follow these instructions at home: Medicines  Take over-the-counter and prescription medicines only as told by your doctor.  If you were prescribed an antibiotic medicine, take it as told by your doctor. Do not stop taking it even if you start to feel better. General instructions  Make sure you: ? Pee until your bladder is empty. ? Do not hold pee for a long time. ? Empty your bladder after sex. ? Wipe from front to back after peeing or pooping if you are a male. Use each tissue one time when you wipe.  Drink enough fluid to keep your pee pale yellow.  Keep all follow-up visits.   Contact a doctor if:  You do not get better after 1-2 days.  Your symptoms go away and then come back. Get help right away if:  You have very bad back pain.  You have very bad pain in your lower belly.  You have a fever.  You have chills.  You feeling like you will vomit or you vomit. Summary  A  urinary tract infection (UTI) is an infection of any part of the urinary tract.  This condition is caused by germs in your genital area.  There are many risk factors for a UTI.  Treatment includes antibiotic medicines.  Drink enough fluid to keep your pee pale yellow. This information is not intended to replace advice given to you by your health care provider. Make sure you discuss any questions you have with your health care provider. Document Revised: 08/23/2019 Document Reviewed: 08/23/2019 Elsevier Patient Education  [employer].

## 2020-02-08 LAB — URINE CULTURE

## 2020-02-09 ENCOUNTER — Other Ambulatory Visit: Payer: Self-pay | Admitting: Nurse Practitioner

## 2020-02-09 MED ORDER — SULFAMETHOXAZOLE-TRIMETHOPRIM 800-160 MG PO TABS: 1.0000 | ORAL_TABLET | Freq: Two times a day (BID) | ORAL | 0 refills | Status: DC

## 2020-02-12 ENCOUNTER — Ambulatory Visit: Payer: Medicare Other | Admitting: Cardiology

## 2020-02-14 ENCOUNTER — Telehealth: Payer: Self-pay | Admitting: Emergency Medicine

## 2020-02-17 ENCOUNTER — Other Ambulatory Visit: Payer: Self-pay

## 2020-02-17 ENCOUNTER — Encounter: Payer: Self-pay | Admitting: Family Medicine

## 2020-02-17 ENCOUNTER — Ambulatory Visit (INDEPENDENT_AMBULATORY_CARE_PROVIDER_SITE_OTHER): Payer: Medicare Other | Admitting: Family Medicine

## 2020-02-17 VITALS — BP 172/77 | HR 68 | Ht 73.0 in | Wt 266.0 lb

## 2020-02-17 DIAGNOSIS — E113592 Type 2 diabetes mellitus with proliferative diabetic retinopathy without macular edema, left eye: Secondary | ICD-10-CM

## 2020-02-17 DIAGNOSIS — I10 Essential (primary) hypertension: Secondary | ICD-10-CM | POA: Diagnosis not present

## 2020-02-17 DIAGNOSIS — E039 Hypothyroidism, unspecified: Secondary | ICD-10-CM

## 2020-02-17 DIAGNOSIS — E1142 Type 2 diabetes mellitus with diabetic polyneuropathy: Secondary | ICD-10-CM

## 2020-02-17 DIAGNOSIS — E785 Hyperlipidemia, unspecified: Secondary | ICD-10-CM | POA: Diagnosis not present

## 2020-02-17 DIAGNOSIS — E890 Postprocedural hypothyroidism: Secondary | ICD-10-CM

## 2020-02-17 DIAGNOSIS — Z794 Long term (current) use of insulin: Secondary | ICD-10-CM | POA: Diagnosis not present

## 2020-02-17 DIAGNOSIS — E1140 Type 2 diabetes mellitus with diabetic neuropathy, unspecified: Secondary | ICD-10-CM

## 2020-02-17 DIAGNOSIS — E1149 Type 2 diabetes mellitus with other diabetic neurological complication: Secondary | ICD-10-CM | POA: Diagnosis not present

## 2020-02-17 LAB — BAYER DCA HB A1C WAIVED: HB A1C (BAYER DCA - WAIVED): 8.1 % — ABNORMAL HIGH (ref <7.0)

## 2020-02-17 MED ORDER — GABAPENTIN 300 MG PO CAPS: 300.0000 mg | ORAL_CAPSULE | Freq: Two times a day (BID) | ORAL | 3 refills | Status: DC

## 2020-02-17 MED ORDER — APIXABAN 5 MG PO TABS: 5.0000 mg | ORAL_TABLET | Freq: Two times a day (BID) | ORAL | 3 refills | Status: DC

## 2020-02-17 MED ORDER — OZEMPIC (1 MG/DOSE) 2 MG/1.5ML ~~LOC~~ SOPN
1.0000 mg | PEN_INJECTOR | SUBCUTANEOUS | 3 refills | Status: DC
Start: 2020-02-17 — End: 2020-12-10

## 2020-02-17 MED ORDER — LEVOTHYROXINE SODIUM 175 MCG PO TABS: 175.0000 ug | ORAL_TABLET | Freq: Every day | ORAL | 3 refills | Status: DC

## 2020-02-17 MED ORDER — LEVEMIR FLEXTOUCH 100 UNIT/ML ~~LOC~~ SOPN
35.0000 [IU] | PEN_INJECTOR | Freq: Two times a day (BID) | SUBCUTANEOUS | 11 refills | Status: DC
Start: 2020-02-17 — End: 2020-05-02

## 2020-02-17 MED ORDER — APIXABAN 5 MG PO TABS: 5.0000 mg | ORAL_TABLET | Freq: Two times a day (BID) | ORAL | 3 refills | Status: AC

## 2020-02-17 MED ORDER — LEVOTHYROXINE SODIUM 175 MCG PO TABS: 175.0000 ug | ORAL_TABLET | Freq: Every day | ORAL | 3 refills | Status: AC

## 2020-02-17 NOTE — Addendum Note (Signed)
Addended by: Antonietta Barcelona D on: 02/17/2020 11:37 AM   Modules accepted: Orders

## 2020-02-17 NOTE — Addendum Note (Signed)
Addended by: Antonietta Barcelona D on: 02/17/2020 10:23 AM   Modules accepted: Orders

## 2020-02-17 NOTE — Progress Notes (Signed)
BP (!) 172/77   Pulse 68   Ht '6\' 1"'  (1.854 m)   Wt 266 lb (120.7 kg)   SpO2 97%   BMI 35.09 kg/m    Subjective:   Patient ID: [name], male    DOB: Jul 13, 1945, 75 y.o.   MRN: [id number]  HPI: [name] is a 75 y.o. male presenting on 02/17/2020 for Medical Management of Chronic Issues, Hypothyroidism, Diabetes, and Hypertension   HPI Type 2 diabetes mellitus Patient comes in today for recheck of his diabetes. Patient has been currently taking Levemir 35 to 38 units twice daily and Forman and Ozempic. Patient is currently on an ACE inhibitor/ARB. Patient has not seen an ophthalmologist this year. Patient denies any issues with their feet. The symptom started onset as an adult CHF and A. fib and hypothyroidism and neuropathy retinopathy ARE RELATED TO DM   Hypertension Patient is currently on furosemide and lisinopril and, and their blood pressure today is he also sees the [place] and they manage this. Patient denies any lightheadedness or dizziness. Patient denies headaches, blurred vision, chest pains, shortness of breath, or weakness. Denies any side effects from medication and is content with current medication.   Hypothyroidism recheck Patient is coming in for thyroid recheck today as well. They deny any issues with hair changes or heat or cold problems or diarrhea or constipation. They deny any chest pain or palpitations. They are currently on levothyroxine 145mcrograms   Hyperlipidemia Patient is coming in for recheck of his hyperlipidemia. The patient is currently taking simvastatin. They deny any issues with myalgias or history of liver damage from it. They deny any focal numbness or weakness or chest pain.   Relevant past medical, surgical, family and social history reviewed and updated as indicated. Interim medical history since our last visit reviewed. Allergies and medications reviewed and updated.  Review of Systems  Constitutional: Negative for chills and  fever.  Respiratory: Negative for shortness of breath and wheezing.   Cardiovascular: Negative for chest pain and leg swelling.  Musculoskeletal: Negative for back pain and gait problem.  Skin: Negative for rash.  Neurological: Positive for weakness (Patient feels like he has been a lot more weak Covid and been sitting around a lot more, he is not currently doing any exercises or stretches and redommended to do them every day. gave instructions). Negative for dizziness and numbness.  All other systems reviewed and are negative.   Per HPI unless specifically indicated above   Allergies as of 02/17/2020      Reactions   Phenothiazines Anaphylaxis   Actos [pioglitazone] Swelling      Medication List       Accurate as of February 17, 2020  9:37 AM. If you have any questions, ask your nurse or doctor.        acetaminophen 500 MG tablet Commonly known as: TYLENOL Take 1,000 mg by mouth every 6 (six) hours as needed (pain).   apixaban 5 MG Tabs tablet Commonly known as: Eliquis Take 1 tablet (5 mg total) by mouth 2 (two) times daily.   azelastine 0.1 % nasal spray Commonly known as: ASTELIN Place 1 spray into both nostrils 2 (two) times daily.   cephALEXin 500 MG capsule Commonly known as: Keflex Take 1 capsule (500 mg total) by mouth 2 (two) times daily.   cholecalciferol 1000 units tablet Commonly known as: VITAMIN D Take 1,000 Units by mouth daily.   fluticasone 50 MCG/ACT nasal spray Commonly known as:  FLONASE INSTILL TWO SPRAYS IN EACH NOSTRIL DAILY   furosemide 20 MG tablet Commonly known as: LASIX Take 1 tablet (20 mg total) by mouth daily.   gabapentin 300 MG capsule Commonly known as: NEURONTIN Take 1 capsule (300 mg total) by mouth 2 (two) times daily.   glucose blood test strip 1 strip by Does not apply route 3 (three) times daily. Uses true metrix strips (diabetic club)   insulin detemir 100 UNIT/ML injection Commonly known as: LEVEMIR Inject 0.47-0.48  mLs (47-48 Units total) into the skin 2 (two) times daily. What changed: how much to take   Levemir FlexTouch 100 UNIT/ML FlexPen Generic drug: insulin detemir Inject 35-40 Units into the skin 2 (two) times daily. What changed: You were already taking a medication with the same name, and this prescription was added. Make sure you understand how and when to take each. Changed by: [name], MD   levothyroxine 175 MCG tablet Commonly known as: SYNTHROID Take 1 tablet (175 mcg total) by mouth daily before breakfast. What changed: additional instructions Changed by: Fransisca Kaufmann Kreed Kauffman, MD   lisinopril 5 MG tablet Commonly known as: ZESTRIL Take 5 mg by mouth daily.   MENS MULTI VITAMIN & MINERAL PO Take 1 tablet by mouth daily.   metFORMIN 1000 MG tablet Commonly known as: GLUCOPHAGE TAKE 1 TABLET BY MOUTH TWICE DAILY WITH A MEAL. What changed: See the new instructions.   Ozempic (1 MG/DOSE) 2 MG/1.5ML Sopn Generic drug: Semaglutide (1 MG/DOSE) Inject 1 mg into the skin once a week.   simvastatin 80 MG tablet Commonly known as: ZOCOR Take 0.5 tablets (40 mg total) by mouth at bedtime.   sulfamethoxazole-trimethoprim 800-160 MG tablet Commonly known as: Bactrim DS Take 1 tablet by mouth 2 (two) times daily.   tamsulosin 0.4 MG Caps capsule Commonly known as: FLOMAX Take 2 capsules (0.8 mg total) by mouth at bedtime.   traMADol 50 MG tablet Commonly known as: ULTRAM Take 50 mg by mouth 2 (two) times daily.   triamcinolone 0.1 % Commonly known as: KENALOG Apply 1 application topically 2 (two) times daily.        Objective:   BP (!) 172/77   Pulse 68   Ht '6\' 1"'  (1.854 m)   Wt 266 lb (120.7 kg)   SpO2 97%   BMI 35.09 kg/m   Wt Readings from Last 3 Encounters:  02/17/20 266 lb (120.7 kg)  02/05/20 269 lb (122 kg)  01/03/20 266 lb 6.4 oz (120.8 kg)    Physical Exam Vitals and nursing note reviewed.  Constitutional:      General: He is not in acute  distress.    Appearance: He is well-developed and well-nourished. He is not diaphoretic.  Eyes:     General: No scleral icterus.    Extraocular Movements: EOM normal.     Conjunctiva/sclera: Conjunctivae normal.  Neck:     Thyroid: No thyromegaly.  Cardiovascular:     Rate and Rhythm: Normal rate and regular rhythm.     Pulses: Intact distal pulses.     Heart sounds: Normal heart sounds. No murmur heard.   Pulmonary:     Effort: Pulmonary effort is normal. No respiratory distress.     Breath sounds: Normal breath sounds. No wheezing.  Musculoskeletal:        General: No edema. Normal range of motion.     Cervical back: Neck supple.  Lymphadenopathy:     Cervical: No cervical adenopathy.  Skin:  General: Skin is warm and dry.     Findings: No rash.  Neurological:     Mental Status: He is alert and oriented to person, place, and time.     Coordination: Coordination normal.  Psychiatric:        Mood and Affect: Mood and affect normal.        Behavior: Behavior normal.       Assessment & Plan:   Problem List Items Addressed This Visit      Cardiovascular and Mediastinum   Essential hypertension - Primary   Relevant Medications   apixaban (ELIQUIS) 5 MG TABS tablet   Other Relevant Orders   CBC with Differential/Platelet   CMP14+EGFR   Lipid panel   Bayer DCA Hb A1c Waived   TSH     Endocrine   Postsurgical hypothyroidism   Relevant Medications   levothyroxine (SYNTHROID) 175 MCG tablet   Type 2 diabetes mellitus with diabetic neuropathy, unspecified (HCC)   Relevant Medications   Semaglutide, 1 MG/DOSE, (OZEMPIC, 1 MG/DOSE,) 2 MG/1.5ML SOPN   insulin detemir (LEVEMIR FLEXTOUCH) 100 UNIT/ML FlexPen   Other Relevant Orders   CBC with Differential/Platelet   CMP14+EGFR   Lipid panel   Bayer DCA Hb A1c Waived   TSH   Hypothyroidism   Relevant Medications   levothyroxine (SYNTHROID) 175 MCG tablet   Other Relevant Orders   CBC with Differential/Platelet    CMP14+EGFR   Lipid panel   Bayer DCA Hb A1c Waived   TSH   Diabetic neuropathy (HCC)   Relevant Medications   gabapentin (NEURONTIN) 300 MG capsule   Semaglutide, 1 MG/DOSE, (OZEMPIC, 1 MG/DOSE,) 2 MG/1.5ML SOPN   insulin detemir (LEVEMIR FLEXTOUCH) 100 UNIT/ML FlexPen   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  Please have patient switched from Levemir where he draws up his own insulin to FlexTouch pen because of his shaky hands therapy helped him better with the injections. Follow up plan: Return in about 3 months (around 05/17/2020), or if symptoms worsen or fail to improve, for Diabetes and cholesterol.  Counseling provided for all of the vaccine components Orders Placed This Encounter  Procedures  . CBC with Differential/Platelet  . CMP14+EGFR  . Lipid panel  . Bayer DCA Hb A1c Waived  . TSH    Caryl Pina, MD [facility] 02/17/2020, 9:37 AM

## 2020-02-17 NOTE — Progress Notes (Signed)
Refills failed. resent 

## 2020-02-18 ENCOUNTER — Telehealth: Payer: Self-pay | Admitting: *Deleted

## 2020-02-18 LAB — CBC WITH DIFFERENTIAL/PLATELET
Basophils Absolute: 0.1 10*3/uL (ref 0.0–0.2)
Basos: 1 %
EOS (ABSOLUTE): 0.1 10*3/uL (ref 0.0–0.4)
Eos: 1 %
Hematocrit: 35.5 % — ABNORMAL LOW (ref 37.5–51.0)
Hemoglobin: 12.3 g/dL — ABNORMAL LOW (ref 13.0–17.7)
Immature Grans (Abs): 0.1 10*3/uL (ref 0.0–0.1)
Immature Granulocytes: 1 %
Lymphocytes Absolute: 1.6 10*3/uL (ref 0.7–3.1)
Lymphs: 16 %
MCH: 33.5 pg — ABNORMAL HIGH (ref 26.6–33.0)
MCHC: 34.6 g/dL (ref 31.5–35.7)
MCV: 97 fL (ref 79–97)
Monocytes Absolute: 0.7 10*3/uL (ref 0.1–0.9)
Monocytes: 7 %
Neutrophils Absolute: 7.6 10*3/uL — ABNORMAL HIGH (ref 1.4–7.0)
Neutrophils: 74 %
Platelets: 110 10*3/uL — ABNORMAL LOW (ref 150–450)
RBC: 3.67 x10E6/uL — ABNORMAL LOW (ref 4.14–5.80)
RDW: 12.7 % (ref 11.6–15.4)
WBC: 10.2 10*3/uL (ref 3.4–10.8)

## 2020-02-18 LAB — CMP14+EGFR
ALT: 18 IU/L (ref 0–44)
AST: 20 IU/L (ref 0–40)
Albumin/Globulin Ratio: 1.5 (ref 1.2–2.2)
Albumin: 4 g/dL (ref 3.7–4.7)
Alkaline Phosphatase: 69 IU/L (ref 44–121)
BUN/Creatinine Ratio: 17 (ref 10–24)
BUN: 30 mg/dL — ABNORMAL HIGH (ref 8–27)
Bilirubin Total: 0.4 mg/dL (ref 0.0–1.2)
CO2: 21 mmol/L (ref 20–29)
Calcium: 9 mg/dL (ref 8.6–10.2)
Chloride: 96 mmol/L (ref 96–106)
Creatinine, Ser: 1.81 mg/dL — ABNORMAL HIGH (ref 0.76–1.27)
GFR calc Af Amer: 41 mL/min/{1.73_m2} — ABNORMAL LOW (ref >59)
GFR calc non Af Amer: 36 mL/min/{1.73_m2} — ABNORMAL LOW (ref >59)
Globulin, Total: 2.7 g/dL (ref 1.5–4.5)
Glucose: 195 mg/dL — ABNORMAL HIGH (ref 65–99)
Potassium: 6.2 mmol/L (ref 3.5–5.2)
Sodium: 130 mmol/L — ABNORMAL LOW (ref 134–144)
Total Protein: 6.7 g/dL (ref 6.0–8.5)

## 2020-02-18 LAB — TSH: TSH: 1.85 u[IU]/mL (ref 0.450–4.500)

## 2020-02-18 LAB — LIPID PANEL
Chol/HDL Ratio: 2.1 ratio (ref 0.0–5.0)
Cholesterol, Total: 122 mg/dL (ref 100–199)
HDL: 59 mg/dL (ref >39)
LDL Chol Calc (NIH): 53 mg/dL (ref 0–99)
Triglycerides: 40 mg/dL (ref 0–149)
VLDL Cholesterol Cal: 10 mg/dL (ref 5–40)

## 2020-02-18 NOTE — Telephone Encounter (Signed)
Attempted to call patient with Critical lab results low sodium and elevated potassium.  Per Dr. Livia Snellen patient needs to come in this morning for a STAT BMP.

## 2020-02-19 ENCOUNTER — Other Ambulatory Visit: Payer: Self-pay

## 2020-02-19 DIAGNOSIS — E875 Hyperkalemia: Secondary | ICD-10-CM

## 2020-02-24 ENCOUNTER — Telehealth: Payer: Self-pay

## 2020-02-24 DIAGNOSIS — M545 Low back pain, unspecified: Secondary | ICD-10-CM | POA: Diagnosis not present

## 2020-02-24 NOTE — Telephone Encounter (Signed)
Patient reports that he has had decreased appetite today, is not hungry and does not want to eat.  He reports he does not have any stomach upset or diarrhea, just has no appetite.  Today is the first day he has noticed this.  I encouraged patient to stay hydrated today and to try to eat small meals, if his symptoms had not improved by tomorrow to call us and we would scheduled an appointment for him to be seen.

## 2020-02-25 ENCOUNTER — Other Ambulatory Visit (HOSPITAL_COMMUNITY): Payer: Self-pay | Admitting: Sports Medicine

## 2020-02-25 ENCOUNTER — Ambulatory Visit (INDEPENDENT_AMBULATORY_CARE_PROVIDER_SITE_OTHER): Payer: Medicare Other | Admitting: Internal Medicine

## 2020-02-25 ENCOUNTER — Other Ambulatory Visit: Payer: Self-pay

## 2020-02-25 ENCOUNTER — Encounter: Payer: Self-pay | Admitting: Internal Medicine

## 2020-02-25 ENCOUNTER — Telehealth: Payer: Self-pay

## 2020-02-25 VITALS — BP 138/66 | HR 71 | Ht 72.0 in | Wt 257.0 lb

## 2020-02-25 DIAGNOSIS — I4891 Unspecified atrial fibrillation: Secondary | ICD-10-CM

## 2020-02-25 DIAGNOSIS — I2510 Atherosclerotic heart disease of native coronary artery without angina pectoris: Secondary | ICD-10-CM

## 2020-02-25 DIAGNOSIS — M1990 Unspecified osteoarthritis, unspecified site: Secondary | ICD-10-CM

## 2020-02-25 DIAGNOSIS — I5042 Chronic combined systolic (congestive) and diastolic (congestive) heart failure: Secondary | ICD-10-CM | POA: Diagnosis not present

## 2020-02-25 DIAGNOSIS — M4800 Spinal stenosis, site unspecified: Secondary | ICD-10-CM

## 2020-02-25 NOTE — Progress Notes (Signed)
HPI [name] returns today for followup. He is a pleasant 75 yo man with a h/o persistent atrial fib, CHB, s/p PPM insertion and class 2 diastolic heart failure. He has done well since reverting back to NSR. His dyspnea is improved. No edema. His amiodarone was stopped. He denies chest pain or sob. He has class 2 dyspnea. He denies peripheral edema.  Allergies  Allergen Reactions  . Phenothiazines Anaphylaxis  . Actos [Pioglitazone] Swelling     Current Outpatient Medications  Medication Sig Dispense Refill  . acetaminophen (TYLENOL) 500 MG tablet Take 1,000 mg by mouth every 6 (six) hours as needed (pain).    Marland Kitchen apixaban (ELIQUIS) 5 MG TABS tablet Take 1 tablet (5 mg total) by mouth 2 (two) times daily. 60 tablet 3  . azelastine (ASTELIN) 0.1 % nasal spray Place 1 spray into both nostrils 2 (two) times daily. 30 mL 12  . cholecalciferol (VITAMIN D) 1000 units tablet Take 1,000 Units by mouth daily.     . fluticasone (FLONASE) 50 MCG/ACT nasal spray INSTILL TWO SPRAYS IN EACH NOSTRIL DAILY 16 g 6  . furosemide (LASIX) 20 MG tablet Take 1 tablet (20 mg total) by mouth daily. 30 tablet 1  . gabapentin (NEURONTIN) 300 MG capsule Take 1 capsule (300 mg total) by mouth 2 (two) times daily. 180 capsule 3  . glucose blood test strip 1 strip by Does not apply route 3 (three) times daily. Uses true metrix strips (diabetic club)  12  . insulin detemir (LEVEMIR FLEXTOUCH) 100 UNIT/ML FlexPen Inject 35-40 Units into the skin 2 (two) times daily. 25 mL 11  . insulin detemir (LEVEMIR) 100 UNIT/ML injection Inject 0.47-0.48 mLs (47-48 Units total) into the skin 2 (two) times daily. (Patient taking differently: Inject 38-40 Units into the skin 2 (two) times daily.) 10 mL   . levothyroxine (SYNTHROID) 175 MCG tablet Take 1 tablet (175 mcg total) by mouth daily before breakfast. 90 tablet 3  . lisinopril (ZESTRIL) 5 MG tablet Take 5 mg by mouth daily.    . metFORMIN (GLUCOPHAGE) 1000 MG tablet TAKE  1 TABLET BY MOUTH TWICE DAILY WITH A MEAL. (Patient taking differently: Take 1,000 mg by mouth in the morning and at bedtime.) 180 tablet 0  . Multiple Vitamins-Minerals (MENS MULTI VITAMIN & MINERAL PO) Take 1 tablet by mouth daily.      . Semaglutide, 1 MG/DOSE, (OZEMPIC, 1 MG/DOSE,) 2 MG/1.5ML SOPN Inject 1 mg into the skin once a week. 4.5 mL 3  . simvastatin (ZOCOR) 80 MG tablet Take 0.5 tablets (40 mg total) by mouth at bedtime. 15 tablet 0  . tamsulosin (FLOMAX) 0.4 MG CAPS capsule Take 2 capsules (0.8 mg total) by mouth at bedtime. 180 capsule 0  . traMADol (ULTRAM) 50 MG tablet Take 50 mg by mouth 2 (two) times daily.    Marland Kitchen triamcinolone (KENALOG) 0.1 % Apply 1 application topically 2 (two) times daily. 30 g 2   No current facility-administered medications for this visit.     Past Medical History:  Diagnosis Date  . Arthritis   . Atrial fibrillation ([place])   . BPH (benign prostatic hypertrophy) 04/15/2010  . Cataract   . Colon polyps   . DEPRESSION 01/22/2009  . Heart valve replaced   . HYPERCHOLESTEROLEMIA 01/22/2009  . HYPERTENSION 01/22/2009   Dr. [name]  . 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 Morbid obesity ([place])   . Tremors of nervous system    ?  ptsd  . Ulcer     ROS:   All systems reviewed and negative except as noted in the HPI.   Past Surgical History:  Procedure Laterality Date  . 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 APPENDECTOMY    . bilateral cataract surg    . COLONOSCOPY  04/24/2007   [name]: normal  . COLONOSCOPY WITH ESOPHAGOGASTRODUODENOSCOPY (EGD) N/A 04/05/2012   Procedure: COLONOSCOPY WITH ESOPHAGOGASTRODUODENOSCOPY (EGD);  Surgeon: Daneil Dolin, MD;  Location: [facility];  Service: Endoscopy;  Laterality: N/A;  10;15  . DENTAL SURGERY  05/2004   Dental extractions  . EYE SURGERY Bilateral    cataract  . HEEL SPUR SURGERY Bilateral    resection of heel spur  . KNEE  ARTHROSCOPY WITH LATERAL MENISECTOMY Right 04/04/2014   Procedure: KNEE ARTHROSCOPY WITH LATERAL MENISECTOMY;  Surgeon: Carole Civil, MD;  Location: [facility] ORS;  Service: Orthopedics;  Laterality: Right;  . PACEMAKER IMPLANT N/A 06/13/2016   Procedure: Pacemaker Implant- Dual Chamber;  Surgeon: Evans Lance, MD;  Location: [facility];  Service: Cardiovascular;  Laterality: N/A;  . PACEMAKER INSERTION  2018  . THYROIDECTOMY  03/21/2013   DR [name]  . THYROIDECTOMY N/A 03/21/2013   Procedure: THYROIDECTOMY;  Surgeon: [name], MD;  Location: [facility];  Service: General;  Laterality: N/A;  . TONSILLECTOMY       Family History  Problem Relation Age of Onset  . Heart disease Father   . Congestive Heart Failure Father   . Arthritis Father   . Diabetes Other   . Benign prostatic hyperplasia Brother      Social History   Socioeconomic History  . Marital status: Single    Spouse name: Not on file  . Number of children: 1  . Years of education: HS  . Highest education level: Not on file  Occupational History  . Occupation: Administrator, retired    Fish farm manager: RETIRED  Tobacco Use  . Smoking status: Former Smoker    Packs/day: 0.00    Years: 12.00    Pack years: 0.00    Types: Cigarettes    Quit date: 09/20/1961    Years since quitting: 58.4  . Smokeless tobacco: Never Used  Vaping Use  . Vaping Use: Never used  Substance and Sexual Activity  . Alcohol use: Not Currently  . Drug use: No  . Sexual activity: Never    Comment: Has not smoked in 20 years  Other Topics Concern  . Not on file  Social History Narrative   Lives alone.   Quit smoking approximately 28 years ago.   Long haul truck driver, lives in [place].   Social Determinants of Health   Financial Resource Strain: Not on file  Food Insecurity: Not on file  Transportation Needs: Not on file  Physical Activity: Not on file  Stress: Not on file  Social Connections: Not on file  Intimate Partner  Violence: Not on file     BP 138/66   Pulse 71   Ht 6' (1.829 m)   Wt 257 lb (116.6 kg)   SpO2 99%   BMI 34.86 kg/m   Physical Exam:  Well appearing NAD HEENT: Unremarkable Neck:  No JVD, no thyromegally Lymphatics:  No adenopathy Back:  No CVA tenderness Lungs:  Clear with no wheezes HEART:  Regular rate rhythm, no murmurs, no rubs, no clicks Abd:  soft, positive bowel sounds, no organomegally, no rebound,  no guarding Ext:  2 plus pulses, no edema, no cyanosis, no clubbing Skin:  No rashes no nodules Neuro:  CN II through XII intact, motor grossly intact   DEVICE  Normal device function.  See PaceArt for details.   Assess/Plan 1. Persistent atrial fib - he has maintained NSR. His amiodarone was stopped 2.  CAD - he denies anginal symptoms.  3. Chronic systolic heart failure - he appears to be euvolemic exam. He is encouraged to maintain a low sodium diet. His dyspnea is improved on NSR.  4. PPM. - his medtronic DDD PM is working normally. He has underlying CHB.   [name] Sinner Jinger Middlesworth,MD

## 2020-02-25 NOTE — Telephone Encounter (Signed)
Pt called back in about his loss of appetite for the last 2 days, this has not changed, he tried to eat a bowl of cheerios this morning but only had a few spoonfuls, then bought a cheeseburger & only took 2 bites of it. Appt made for tomorrow. Encouraged him to eat more bland type foods, and stay hydrated.

## 2020-02-25 NOTE — Patient Instructions (Signed)
Medication Instructions:  Your physician recommends that you continue on your current medications as directed. Please refer to the Current Medication list given to you today.  *If you need a refill on your cardiac medications before your next appointment, please call your pharmacy*   Lab Work: NONE   If you have labs (blood work) drawn today and your tests are completely normal, you will receive your results only by: Marland Kitchen MyChart Message (if you have MyChart) OR . A paper copy in the mail If you have any lab test that is abnormal or we need to change your treatment, we will call you to review the results.   Testing/Procedures: NONE    Follow-Up: At [facility], you and your health needs are our priority.  As part of our continuing mission to provide you with exceptional heart care, we have created designated Provider Care Teams.  These Care Teams include your primary Cardiologist (physician) and Advanced Practice Providers (APPs -  Physician Assistants and Nurse Practitioners) who all work together to provide you with the care you need, when you need it.  We recommend signing up for the patient portal called "MyChart".  Sign up information is provided on this After Visit Summary.  MyChart is used to connect with patients for Virtual Visits (Telemedicine).  Patients are able to view lab/test results, encounter notes, upcoming appointments, etc.  Non-urgent messages can be sent to your provider as well.   To learn more about what you can do with MyChart, go to [email or web address].    Your next appointment:   10 month(s)  The format for your next appointment:   In Person  Provider:   Cristopher Peru, MD   Other Instructions Thank you for choosing [facility]!

## 2020-02-26 ENCOUNTER — Ambulatory Visit (INDEPENDENT_AMBULATORY_CARE_PROVIDER_SITE_OTHER): Payer: Medicare Other | Admitting: Family Medicine

## 2020-02-26 DIAGNOSIS — R5383 Other fatigue: Secondary | ICD-10-CM | POA: Diagnosis not present

## 2020-02-26 DIAGNOSIS — R5381 Other malaise: Secondary | ICD-10-CM

## 2020-02-26 NOTE — Progress Notes (Signed)
Subjective:    Patient ID: [name], male    DOB: Apr 11, 1945, 75 y.o.   MRN: [id number]   HPI: [name] is a 75 y.o. male presenting for poor appetite for 2 days. No URi sx. Denies fever. I just feel bad. Glucose was a hundred and something this AM.   Depression screen [facility] 2/9 02/17/2020 02/05/2020 01/03/2020 01/03/2020 10/02/2019  Decreased Interest 0 0 0 0 0  Down, Depressed, Hopeless 0 0 0 0 0  PHQ - 2 Score 0 0 0 0 0  Altered sleeping - - - - -  Tired, decreased energy - - - - -  Change in appetite - - - - -  Feeling bad or failure about yourself  - - - - -  Trouble concentrating - - - - -  Moving slowly or fidgety/restless - - - - -  Suicidal thoughts - - - - -  PHQ-9 Score - - - - -  Difficult doing work/chores - - - - -  Some recent data might be hidden     Relevant past medical, surgical, family and social history reviewed and updated as indicated.  Interim medical history since our last visit reviewed. Allergies and medications reviewed and updated.  ROS:  Review of Systems  Constitutional: Positive for fatigue. Negative for fever.  Respiratory: Negative for shortness of breath.   Cardiovascular: Negative for chest pain.  Musculoskeletal: Positive for myalgias. Negative for arthralgias.  Skin: Negative for rash.     Social History   Tobacco Use  Smoking Status Former Smoker  . Packs/day: 0.00  . Years: 12.00  . Pack years: 0.00  . Types: Cigarettes  . Quit date: 09/20/1961  . Years since quitting: 58.4  Smokeless Tobacco Never Used       Objective:     Wt Readings from Last 3 Encounters:  02/25/20 257 lb (116.6 kg)  02/17/20 266 lb (120.7 kg)  02/05/20 269 lb (122 kg)     Exam deferred. Pt. Harboring due to COVID 19. Phone visit performed.   Results for orders placed or performed in visit on 02/26/20  CMP14+EGFR  Result Value Ref Range   Glucose 230 (H) 65 - 99 mg/dL   BUN 23 8 - 27 mg/dL   Creatinine, Ser 1.62 (H) 0.76 -  1.27 mg/dL   GFR calc non Af Amer 41 (L) >59 mL/min/1.73   GFR calc Af Amer 47 (L) >59 mL/min/1.73   BUN/Creatinine Ratio 14 10 - 24   Sodium 126 (L) 134 - 144 mmol/L   Potassium 5.4 (H) 3.5 - 5.2 mmol/L   Chloride 91 (L) 96 - 106 mmol/L   CO2 20 20 - 29 mmol/L   Calcium 8.9 8.6 - 10.2 mg/dL   Total Protein 6.6 6.0 - 8.5 g/dL   Albumin 3.9 3.7 - 4.7 g/dL   Globulin, Total 2.7 1.5 - 4.5 g/dL   Albumin/Globulin Ratio 1.4 1.2 - 2.2   Bilirubin Total 0.4 0.0 - 1.2 mg/dL   Alkaline Phosphatase 75 44 - 121 IU/L   AST 25 0 - 40 IU/L   ALT 22 0 - 44 IU/L  CBC with Differential/Platelet  Result Value Ref Range   WBC 7.6 3.4 - 10.8 x10E3/uL   RBC 4.00 (L) 4.14 - 5.80 x10E6/uL   Hemoglobin 13.5 13.0 - 17.7 g/dL   Hematocrit 39.5 37.5 - 51.0 %   MCV 99 (H) 79 - 97 fL   MCH 33.8 (H) 26.6 -  33.0 pg   MCHC 34.2 31.5 - 35.7 g/dL   RDW 13.1 11.6 - 15.4 %   Platelets 121 (L) 150 - 450 x10E3/uL   Neutrophils 72 Not Estab. %   Lymphs 17 Not Estab. %   Monocytes 9 Not Estab. %   Eos 1 Not Estab. %   Basos 0 Not Estab. %   Neutrophils Absolute 5.4 1.4 - 7.0 x10E3/uL   Lymphocytes Absolute 1.3 0.7 - 3.1 x10E3/uL   Monocytes Absolute 0.7 0.1 - 0.9 x10E3/uL   EOS (ABSOLUTE) 0.1 0.0 - 0.4 x10E3/uL   Basophils Absolute 0.0 0.0 - 0.2 x10E3/uL   Immature Granulocytes 1 Not Estab. %   Immature Grans (Abs) 0.1 0.0 - 0.1 x10E3/uL  Urinalysis  Result Value Ref Range   Specific Gravity, UA 1.010 1.005 - 1.030   pH, UA 6.0 5.0 - 7.5   Color, UA Yellow Yellow   Appearance Ur Clear Clear   Leukocytes,UA 3+ (A) Negative   Protein,UA Negative Negative/Trace   Glucose, UA Negative Negative   Ketones, UA Negative Negative   RBC, UA Trace (A) Negative   Bilirubin, UA Negative Negative   Urobilinogen, Ur 0.2 0.2 - 1.0 mg/dL   Nitrite, UA Negative Negative   *Note: Due to a large number of results and/or encounters for the requested time period, some results have not been displayed. A complete set of  results can be found in Results Review.     Assessment & Plan:   1. Malaise and fatigue     Pt. Was asked to come in for labwork. This showed evidence for UTI. Scrip was provided for Bactrim DS    Orders Placed This Encounter  Procedures  . CMP14+EGFR    Order Specific Question:   Has the patient fasted?    Answer:   Yes    Order Specific Question:   Release to patient    Answer:   Immediate  . CBC with Differential/Platelet  . Urinalysis      Diagnoses and all orders for this visit:  Malaise and fatigue -     CMP14+EGFR -     CBC with Differential/Platelet -     Urinalysis    Virtual Visit via telephone Note  I discussed the limitations, risks, security and privacy concerns of performing an evaluation and management service by telephone and the availability of in person appointments. The patient was identified with two identifiers. Pt.expressed understanding and agreed to proceed. Pt. Is at home. Dr. Livia Snellen is in his office.  Follow Up Instructions:   I discussed the assessment and treatment plan with the patient. The patient was provided an opportunity to ask questions and all were answered. The patient agreed with the plan and demonstrated an understanding of the instructions.   The patient was advised to call back or seek an in-person evaluation if the symptoms worsen or if the condition fails to improve as anticipated.   Total minutes including chart review and phone contact time: 12   Follow up plan: No follow-ups on file.  Claretta Fraise, MD [facility]

## 2020-02-27 ENCOUNTER — Encounter (INDEPENDENT_AMBULATORY_CARE_PROVIDER_SITE_OTHER): Payer: Medicare Other | Admitting: Ophthalmology

## 2020-02-27 ENCOUNTER — Other Ambulatory Visit: Payer: Self-pay

## 2020-02-27 ENCOUNTER — Other Ambulatory Visit: Payer: Medicare Other

## 2020-02-27 DIAGNOSIS — R5383 Other fatigue: Secondary | ICD-10-CM | POA: Diagnosis not present

## 2020-02-27 DIAGNOSIS — R5381 Other malaise: Secondary | ICD-10-CM | POA: Diagnosis not present

## 2020-02-27 LAB — URINALYSIS
Bilirubin, UA: NEGATIVE
Glucose, UA: NEGATIVE
Ketones, UA: NEGATIVE
Nitrite, UA: NEGATIVE
Protein,UA: NEGATIVE
Specific Gravity, UA: 1.01 (ref 1.005–1.030)
Urobilinogen, Ur: 0.2 mg/dL (ref 0.2–1.0)
pH, UA: 6 (ref 5.0–7.5)

## 2020-02-27 LAB — CMP14+EGFR
ALT: 22 IU/L (ref 0–44)
AST: 25 IU/L (ref 0–40)
Albumin/Globulin Ratio: 1.4 (ref 1.2–2.2)
Albumin: 3.9 g/dL (ref 3.7–4.7)
Alkaline Phosphatase: 75 IU/L (ref 44–121)
BUN/Creatinine Ratio: 14 (ref 10–24)
BUN: 23 mg/dL (ref 8–27)
Bilirubin Total: 0.4 mg/dL (ref 0.0–1.2)
CO2: 20 mmol/L (ref 20–29)
Calcium: 8.9 mg/dL (ref 8.6–10.2)
Chloride: 91 mmol/L — ABNORMAL LOW (ref 96–106)
Creatinine, Ser: 1.62 mg/dL — ABNORMAL HIGH (ref 0.76–1.27)
GFR calc Af Amer: 47 mL/min/{1.73_m2} — ABNORMAL LOW (ref >59)
GFR calc non Af Amer: 41 mL/min/{1.73_m2} — ABNORMAL LOW (ref >59)
Globulin, Total: 2.7 g/dL (ref 1.5–4.5)
Glucose: 230 mg/dL — ABNORMAL HIGH (ref 65–99)
Potassium: 5.4 mmol/L — ABNORMAL HIGH (ref 3.5–5.2)
Sodium: 126 mmol/L — ABNORMAL LOW (ref 134–144)
Total Protein: 6.6 g/dL (ref 6.0–8.5)

## 2020-02-27 LAB — CBC WITH DIFFERENTIAL/PLATELET
Basophils Absolute: 0 10*3/uL (ref 0.0–0.2)
Basos: 0 %
EOS (ABSOLUTE): 0.1 10*3/uL (ref 0.0–0.4)
Eos: 1 %
Hematocrit: 39.5 % (ref 37.5–51.0)
Hemoglobin: 13.5 g/dL (ref 13.0–17.7)
Immature Grans (Abs): 0.1 10*3/uL (ref 0.0–0.1)
Immature Granulocytes: 1 %
Lymphocytes Absolute: 1.3 10*3/uL (ref 0.7–3.1)
Lymphs: 17 %
MCH: 33.8 pg — ABNORMAL HIGH (ref 26.6–33.0)
MCHC: 34.2 g/dL (ref 31.5–35.7)
MCV: 99 fL — ABNORMAL HIGH (ref 79–97)
Monocytes Absolute: 0.7 10*3/uL (ref 0.1–0.9)
Monocytes: 9 %
Neutrophils Absolute: 5.4 10*3/uL (ref 1.4–7.0)
Neutrophils: 72 %
Platelets: 121 10*3/uL — ABNORMAL LOW (ref 150–450)
RBC: 4 x10E6/uL — ABNORMAL LOW (ref 4.14–5.80)
RDW: 13.1 % (ref 11.6–15.4)
WBC: 7.6 10*3/uL (ref 3.4–10.8)

## 2020-02-28 ENCOUNTER — Telehealth: Payer: Self-pay

## 2020-02-28 NOTE — Telephone Encounter (Signed)
It looks like it is in Dr. Verlin Grills inbox, I cannot see Dr. Livia Snellen note but from what I can tell of the kidney functions improved and his potassium is improved and his urinalysis did show like possible something there but I cannot tell if he was treated or not treated, the culture is pending so we will probably have to wait for that but the blood work looks to be improved especially in the potassium.

## 2020-02-28 NOTE — Telephone Encounter (Signed)
Pt called requesting that Dr Dettinger review his lab results ASAP and for someone to call him with those results.

## 2020-02-28 NOTE — Telephone Encounter (Signed)
Please review labs and advise.

## 2020-02-29 ENCOUNTER — Other Ambulatory Visit: Payer: Self-pay | Admitting: Family Medicine

## 2020-02-29 ENCOUNTER — Encounter: Payer: Self-pay | Admitting: Family Medicine

## 2020-02-29 MED ORDER — SULFAMETHOXAZOLE-TRIMETHOPRIM 800-160 MG PO TABS: 1.0000 | ORAL_TABLET | Freq: Two times a day (BID) | ORAL | 0 refills | Status: DC

## 2020-03-02 NOTE — Telephone Encounter (Signed)
Patient aware and verbalizes understanding. 

## 2020-03-03 ENCOUNTER — Emergency Department (HOSPITAL_COMMUNITY): Payer: No Typology Code available for payment source

## 2020-03-03 ENCOUNTER — Encounter (HOSPITAL_COMMUNITY): Payer: Self-pay | Admitting: *Deleted

## 2020-03-03 ENCOUNTER — Observation Stay (HOSPITAL_COMMUNITY)
Admission: EM | Admit: 2020-03-03 | Discharge: 2020-03-05 | Disposition: A | Discharge status: Home / Self Care | Payer: No Typology Code available for payment source | Attending: Internal Medicine | Admitting: Internal Medicine

## 2020-03-03 DIAGNOSIS — R260 Ataxic gait: Secondary | ICD-10-CM | POA: Diagnosis not present

## 2020-03-03 DIAGNOSIS — I4891 Unspecified atrial fibrillation: Secondary | ICD-10-CM | POA: Insufficient documentation

## 2020-03-03 DIAGNOSIS — Z86010 Personal history of colonic polyps: Secondary | ICD-10-CM | POA: Insufficient documentation

## 2020-03-03 DIAGNOSIS — R262 Difficulty in walking, not elsewhere classified: Secondary | ICD-10-CM

## 2020-03-03 DIAGNOSIS — Z20822 Contact with and (suspected) exposure to covid-19: Secondary | ICD-10-CM | POA: Insufficient documentation

## 2020-03-03 DIAGNOSIS — E1140 Type 2 diabetes mellitus with diabetic neuropathy, unspecified: Secondary | ICD-10-CM | POA: Diagnosis present

## 2020-03-03 DIAGNOSIS — I130 Hypertensive heart and chronic kidney disease with heart failure and stage 1 through stage 4 chronic kidney disease, or unspecified chronic kidney disease: Secondary | ICD-10-CM | POA: Insufficient documentation

## 2020-03-03 DIAGNOSIS — E039 Hypothyroidism, unspecified: Secondary | ICD-10-CM | POA: Insufficient documentation

## 2020-03-03 DIAGNOSIS — Z87891 Personal history of nicotine dependence: Secondary | ICD-10-CM | POA: Insufficient documentation

## 2020-03-03 DIAGNOSIS — Z794 Long term (current) use of insulin: Secondary | ICD-10-CM | POA: Insufficient documentation

## 2020-03-03 DIAGNOSIS — Z7984 Long term (current) use of oral hypoglycemic drugs: Secondary | ICD-10-CM | POA: Insufficient documentation

## 2020-03-03 DIAGNOSIS — I509 Heart failure, unspecified: Secondary | ICD-10-CM | POA: Insufficient documentation

## 2020-03-03 DIAGNOSIS — N1831 Chronic kidney disease, stage 3a: Secondary | ICD-10-CM | POA: Insufficient documentation

## 2020-03-03 DIAGNOSIS — Z79899 Other long term (current) drug therapy: Secondary | ICD-10-CM | POA: Insufficient documentation

## 2020-03-03 DIAGNOSIS — Z950 Presence of cardiac pacemaker: Secondary | ICD-10-CM | POA: Diagnosis not present

## 2020-03-03 DIAGNOSIS — I2510 Atherosclerotic heart disease of native coronary artery without angina pectoris: Secondary | ICD-10-CM | POA: Diagnosis not present

## 2020-03-03 DIAGNOSIS — E785 Hyperlipidemia, unspecified: Secondary | ICD-10-CM | POA: Diagnosis present

## 2020-03-03 DIAGNOSIS — Z7901 Long term (current) use of anticoagulants: Secondary | ICD-10-CM | POA: Diagnosis not present

## 2020-03-03 DIAGNOSIS — I10 Essential (primary) hypertension: Secondary | ICD-10-CM | POA: Diagnosis present

## 2020-03-03 DIAGNOSIS — F32A Depression, unspecified: Secondary | ICD-10-CM | POA: Diagnosis present

## 2020-03-03 DIAGNOSIS — R2689 Other abnormalities of gait and mobility: Secondary | ICD-10-CM | POA: Diagnosis present

## 2020-03-03 DIAGNOSIS — R270 Ataxia, unspecified: Secondary | ICD-10-CM

## 2020-03-03 DIAGNOSIS — J449 Chronic obstructive pulmonary disease, unspecified: Secondary | ICD-10-CM | POA: Insufficient documentation

## 2020-03-03 DIAGNOSIS — G250 Essential tremor: Secondary | ICD-10-CM

## 2020-03-03 LAB — CBC
HCT: 42.9 % (ref 39.0–52.0)
Hemoglobin: 14.4 g/dL (ref 13.0–17.0)
MCH: 33.4 pg (ref 26.0–34.0)
MCHC: 33.6 g/dL (ref 30.0–36.0)
MCV: 99.5 fL (ref 80.0–100.0)
Platelets: 114 10*3/uL — ABNORMAL LOW (ref 150–400)
RBC: 4.31 MIL/uL (ref 4.22–5.81)
RDW: 14.8 % (ref 11.5–15.5)
WBC: 8.2 10*3/uL (ref 4.0–10.5)
nRBC: 0 % (ref 0.0–0.2)

## 2020-03-03 LAB — BASIC METABOLIC PANEL
Anion gap: 15 (ref 5–15)
BUN: 29 mg/dL — ABNORMAL HIGH (ref 8–23)
CO2: 23 mmol/L (ref 22–32)
Calcium: 9.1 mg/dL (ref 8.9–10.3)
Chloride: 92 mmol/L — ABNORMAL LOW (ref 98–111)
Creatinine, Ser: 1.89 mg/dL — ABNORMAL HIGH (ref 0.61–1.24)
GFR, Estimated: 36 mL/min — ABNORMAL LOW (ref >60)
Glucose, Bld: 176 mg/dL — ABNORMAL HIGH (ref 70–99)
Potassium: 5 mmol/L (ref 3.5–5.1)
Sodium: 130 mmol/L — ABNORMAL LOW (ref 135–145)

## 2020-03-03 LAB — GLUCOSE, CAPILLARY: Glucose-Capillary: 186 mg/dL — ABNORMAL HIGH (ref 70–99)

## 2020-03-03 LAB — RESP PANEL BY RT-PCR (FLU A&B, COVID) ARPGX2
Influenza A by PCR: NEGATIVE
Influenza B by PCR: NEGATIVE
SARS Coronavirus 2 by RT PCR: NEGATIVE

## 2020-03-03 LAB — TROPONIN I (HIGH SENSITIVITY)
Troponin I (High Sensitivity): 16 ng/L (ref <18)
Troponin I (High Sensitivity): 19 ng/L — ABNORMAL HIGH (ref <18)

## 2020-03-03 LAB — MAGNESIUM: Magnesium: 2.3 mg/dL (ref 1.7–2.4)

## 2020-03-03 LAB — CBG MONITORING, ED: Glucose-Capillary: 197 mg/dL — ABNORMAL HIGH (ref 70–99)

## 2020-03-03 MED ORDER — INSULIN ASPART 100 UNIT/ML ~~LOC~~ SOLN: 0.0000 [IU] | Freq: Every day | SUBCUTANEOUS | Status: DC

## 2020-03-03 MED ORDER — GABAPENTIN 300 MG PO CAPS
300.0000 mg | ORAL_CAPSULE | Freq: Two times a day (BID) | ORAL | Status: DC
  Administered 2020-03-03 – 2020-03-05 (×4): 300 mg via ORAL
  Filled 2020-03-03 (×4): qty 1

## 2020-03-03 MED ORDER — INSULIN DETEMIR 100 UNIT/ML ~~LOC~~ SOLN
30.0000 [IU] | Freq: Two times a day (BID) | SUBCUTANEOUS | Status: DC
  Administered 2020-03-04 (×3): 30 [IU] via SUBCUTANEOUS
  Filled 2020-03-03 (×6): qty 0.3

## 2020-03-03 MED ORDER — SENNOSIDES-DOCUSATE SODIUM 8.6-50 MG PO TABS: 1.0000 | ORAL_TABLET | Freq: Every evening | ORAL | Status: DC | PRN

## 2020-03-03 MED ORDER — ACETAMINOPHEN 650 MG RE SUPP: 650.0000 mg | RECTAL | Status: DC | PRN

## 2020-03-03 MED ORDER — LISINOPRIL 5 MG PO TABS
5.0000 mg | ORAL_TABLET | Freq: Every day | ORAL | Status: DC
  Administered 2020-03-04 – 2020-03-05 (×2): 5 mg via ORAL
  Filled 2020-03-03 (×2): qty 1

## 2020-03-03 MED ORDER — ACETAMINOPHEN 160 MG/5ML PO SOLN: 650.0000 mg | ORAL | Status: DC | PRN

## 2020-03-03 MED ORDER — ACETAMINOPHEN 325 MG PO TABS: 650.0000 mg | ORAL_TABLET | ORAL | Status: DC | PRN

## 2020-03-03 MED ORDER — TAMSULOSIN HCL 0.4 MG PO CAPS
0.8000 mg | ORAL_CAPSULE | Freq: Every day | ORAL | Status: DC
  Administered 2020-03-03 – 2020-03-04 (×2): 0.8 mg via ORAL
  Filled 2020-03-03 (×2): qty 2

## 2020-03-03 MED ORDER — ATORVASTATIN CALCIUM 40 MG PO TABS
40.0000 mg | ORAL_TABLET | Freq: Every day | ORAL | Status: DC
  Administered 2020-03-04 – 2020-03-05 (×2): 40 mg via ORAL
  Filled 2020-03-03 (×2): qty 1

## 2020-03-03 MED ORDER — LEVOTHYROXINE SODIUM 75 MCG PO TABS
175.0000 ug | ORAL_TABLET | Freq: Every day | ORAL | Status: DC
  Administered 2020-03-04 – 2020-03-05 (×2): 175 ug via ORAL
  Filled 2020-03-03 (×2): qty 1

## 2020-03-03 MED ORDER — STROKE: EARLY STAGES OF RECOVERY BOOK: Freq: Once | Status: DC

## 2020-03-03 MED ORDER — TRAMADOL HCL 50 MG PO TABS: 50.0000 mg | ORAL_TABLET | Freq: Two times a day (BID) | ORAL | Status: DC | PRN

## 2020-03-03 MED ORDER — INSULIN ASPART 100 UNIT/ML ~~LOC~~ SOLN
0.0000 [IU] | Freq: Three times a day (TID) | SUBCUTANEOUS | Status: DC
  Administered 2020-03-04 (×2): 3 [IU] via SUBCUTANEOUS
  Administered 2020-03-04: 2 [IU] via SUBCUTANEOUS

## 2020-03-03 MED ORDER — APIXABAN 5 MG PO TABS
5.0000 mg | ORAL_TABLET | Freq: Two times a day (BID) | ORAL | Status: DC
  Administered 2020-03-03 – 2020-03-05 (×4): 5 mg via ORAL
  Filled 2020-03-03 (×4): qty 1

## 2020-03-03 NOTE — ED Triage Notes (Signed)
Difficulty walking for 3 days, states he is shaking like a leaf on a tree

## 2020-03-03 NOTE — H&P (Signed)
TRH H&P    Patient Demographics:    [name], is a 75 y.o. male  MRN: [id number]  DOB - 26-Mar-1945  Admit Date - 03/03/2020  Referring MD/NP/PA: [place]   Outpatient Primary MD for the patient is Dettinger, Fransisca Kaufmann, MD  Patient coming from: [facility]  Chief complaint- ataxia   HPI:    [name]  is a 75 y.o. male, with history of " tremors of nervous system," obesity, insulin-dependent diabetes mellitus type 2, hypothyroidism, hypertension, hyperlipidemia, heart valve replaced, depression, BPH, atrial fibrillation status post ICD, presents to the ED with a chief complaint of "walking sideways."  Patient's history is not consistent with past providers.  He reports to few providers at this started 3 days ago, he reports to to other providers including myself that it started 3 weeks ago.  Patient is oriented to person place and time.  He reports that he started "shaking like a leaf."  When he describes the shaking he is talking about his upper extremities only.  He says that it is no worse on the left or right, but equal bilaterally.  It was acute in onset, constant, and not progressing over time.  He reports that he is starting to have to walk sideways.  He doesn't report what that means he just reports that to go upstairs or to go to the bathroom he has to walk sideways.  He said he does not feel dizziness.  This is also not getting any worse.  It is also constant.  He has had no loss of sensation, no paresthesias.  He denies headache, change in vision, change in hearing, difficulty swallowing, drooling, bladder or bowel incontinence.  Patient reports that he has been eating and drinking normally at home.  He has not had any nausea vomiting, abdominal pain, constipation, or diarrhea.  Patient is remarkably poor historian, cannot describe more about his symptoms.  When asked why he has to walk sideways he says because  "I got to walk sideways to get there."  Patient reports that this is new in onset but he does have a documented history of tremors of nervous system.  Patient was recently seen by his PCP for malaise and fatigue, where he complained of decrease in appetite, but he denies that this visit has happened.  So I have not been able to ask him further questions about this visit.  According to the note he did deny upper respiratory symptoms and fever at that time and he denies this still.  Doesn't like patient was seen for "shakiness in September 2021 with his family medicine provider.  At that time it was his legs it was shaking and feeling weak.  At that time patient attributed symptoms as a side effect of an antibiotic for UTI.  He was also seen in March 2021 for "balance issues."  When he presented for that visit he said that it had been going on for 6 weeks.  He was advised to do stretches at home and go to physical therapy if anything worsens.  His diagnosis was muscular deconditioning at that time.  When questioned about these visits he reports that this is different.  Admission requested for stroke rule out.  In the ED Temp 99.4, heart rate 53-89, respiratory rate 13-18, blood pressure 137/66 No leukocytosis, hemoglobin 14.4, chemistry panel is mostly unremarkable with a mild hyponatremia 130, creatinine at baseline Covid is negative Initial troponin XVI, repeat pending CT head had no acute intracranial pathology, mild age-related atrophy UA pending-asked nurse to and out cath so we can get this result Patient admitted as we do not have MRI capability at night   Review of systems:    In addition to the HPI above,  No Fever-chills, No Headache, No changes with Vision or hearing, No problems swallowing food or Liquids, No Chest pain, Cough or Shortness of Breath, No Abdominal pain, No Nausea or Vomiting, bowel movements are regular, No Blood in stool or Urine, No dysuria, No new skin rashes or  bruises, No new joints pains-aches,  No new weakness, reports chronic tingling from diabetic neuropathy, denies numbness in any extremity, reports new tremor No recent weight gain or loss, No polyuria, polydypsia or polyphagia, No significant Mental Stressors.  All other systems reviewed and are negative.    Past History of the following :    Past Medical History:  Diagnosis Date  . Arthritis   . Atrial fibrillation ([place])   . BPH (benign prostatic hypertrophy) 04/15/2010  . Cataract   . Colon polyps   . DEPRESSION 01/22/2009  . Heart valve replaced   . HYPERCHOLESTEROLEMIA 01/22/2009  . HYPERTENSION 01/22/2009   Dr. [name]  . 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 Morbid obesity ([place])   . Tremors of nervous system    ?ptsd  . Ulcer       Past Surgical History:  Procedure Laterality Date  . 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 APPENDECTOMY    . bilateral cataract surg    . COLONOSCOPY  04/24/2007   [name]: normal  . COLONOSCOPY WITH ESOPHAGOGASTRODUODENOSCOPY (EGD) N/A 04/05/2012   Procedure: COLONOSCOPY WITH ESOPHAGOGASTRODUODENOSCOPY (EGD);  Surgeon: Daneil Dolin, MD;  Location[facility];  Service: Endoscopy;  Laterality: N/A;  10;15  . DENTAL SURGERY  05/2004   Dental extractions  . EYE SURGERY Bilateral    cataract  . HEEL SPUR SURGERY Bilateral    resection of heel spur  . KNEE ARTHROSCOPY WITH LATERAL MENISECTOMY Right 04/04/2014   Procedure: KNEE ARTHROSCOPY WITH LATERAL MENISECTOMY;  Surgeon: Carole Civil, MD;  Location: [facility];  Service: Orthopedics;  Laterality: Right;  . PACEMAKER IMPLANT N/A 06/13/2016   Procedure: Pacemaker Implant- Dual Chamber;  Surgeon: Evans Lance, MD;  Location: [facility];  Service: Cardiovascular;  Laterality: N/A;  . PACEMAKER INSERTION  2018  . THYROIDECTOMY  03/21/2013   DR [name]  . THYROIDECTOMY N/A 03/21/2013   Procedure:  THYROIDECTOMY;  Surgeon: [name], MD;  Location: [facility];  Service: General;  Laterality: N/A;  . TONSILLECTOMY        Social History:      Social History   Tobacco Use  . Smoking status: Former Smoker    Packs/day: 0.00    Years: 12.00    Pack years: 0.00    Types: Cigarettes    Quit date: 09/20/1961    Years since quitting: 58.4  . Smokeless tobacco: Never Used  Substance Use Topics  .  Alcohol use: Not Currently       Family History :     Family History  Problem Relation Age of Onset  . Heart disease Father   . Congestive Heart Failure Father   . Arthritis Father   . Diabetes Other   . Benign prostatic hyperplasia Brother      Home Medications:   Prior to Admission medications   Medication Sig Start Date End Date Taking? Authorizing Provider  acetaminophen (TYLENOL) 500 MG tablet Take 1,000 mg by mouth every 6 (six) hours as needed (pain).   Yes [provider]  apixaban (ELIQUIS) 5 MG TABS tablet Take 1 tablet (5 mg total) by mouth 2 (two) times daily. 02/17/20  Yes Dettinger, Fransisca Kaufmann, MD  cholecalciferol (VITAMIN D) 1000 units tablet Take 1,000 Units by mouth daily.    Yes [provider]  furosemide (LASIX) 20 MG tablet Take 1 tablet (20 mg total) by mouth daily. 04/17/17  Yes Timmothy Euler, MD  gabapentin (NEURONTIN) 300 MG capsule Take 1 capsule (300 mg total) by mouth 2 (two) times daily. 02/17/20  Yes Dettinger, Fransisca Kaufmann, MD  insulin detemir (LEVEMIR FLEXTOUCH) 100 UNIT/ML FlexPen Inject 35-40 Units into the skin 2 (two) times daily. Patient taking differently: Inject 30 Units into the skin 2 (two) times daily. 02/17/20  Yes Dettinger, Fransisca Kaufmann, MD  levothyroxine (SYNTHROID) 175 MCG tablet Take 1 tablet (175 mcg total) by mouth daily before breakfast. 02/17/20  Yes Dettinger, Fransisca Kaufmann, MD  lisinopril (ZESTRIL) 5 MG tablet Take 5 mg by mouth daily.   Yes [provider]  metFORMIN (GLUCOPHAGE) 1000 MG tablet TAKE 1 TABLET BY  MOUTH TWICE DAILY WITH A MEAL. Patient taking differently: Take 1,000 mg by mouth in the morning and at bedtime. 07/24/15  Yes Timmothy Euler, MD  Multiple Vitamins-Minerals (MENS MULTI VITAMIN & MINERAL PO) Take 1 tablet by mouth daily.     Yes [provider]  Semaglutide, 1 MG/DOSE, (OZEMPIC, 1 MG/DOSE,) 2 MG/1.5ML SOPN Inject 1 mg into the skin once a week. 02/17/20  Yes Dettinger, Fransisca Kaufmann, MD  simvastatin (ZOCOR) 80 MG tablet Take 0.5 tablets (40 mg total) by mouth at bedtime. 04/04/17  Yes Timmothy Euler, MD  tamsulosin (FLOMAX) 0.4 MG CAPS capsule Take 2 capsules (0.8 mg total) by mouth at bedtime. 10/24/19  Yes Dettinger, Fransisca Kaufmann, MD  traMADol (ULTRAM) 50 MG tablet Take 50 mg by mouth 2 (two) times daily. 08/09/18  Yes [provider]  azelastine (ASTELIN) 0.1 % nasal spray Place 1 spray into both nostrils 2 (two) times daily. Patient not taking: No sig reported 04/16/19   Ronnie Doss M, DO  fluticasone (FLONASE) 50 MCG/ACT nasal spray INSTILL TWO SPRAYS IN [facility] NOSTRIL DAILY Patient not taking: No sig reported 01/10/20   Evelina Dun A, FNP  glucose blood test strip 1 strip by Does not apply route 3 (three) times daily. Uses true metrix strips (diabetic club) 05/12/16   [provider]  insulin detemir (LEVEMIR) 100 UNIT/ML injection Inject 0.47-0.48 mLs (47-48 Units total) into the skin 2 (two) times daily. Patient not taking: No sig reported 07/04/16   Cherre Robins, PharmD  sulfamethoxazole-trimethoprim (BACTRIM DS) 800-160 MG tablet Take 1 tablet by mouth 2 (two) times daily. Until gone, for infection Patient not taking: No sig reported 02/29/20   Claretta Fraise, MD  triamcinolone (KENALOG) 0.1 % Apply 1 application topically 2 (two) times daily. Patient not taking: No sig reported 01/03/20  Ivy Lynn, NP     Allergies:     Allergies  Allergen Reactions  . Phenothiazines Anaphylaxis  . Actos [Pioglitazone] Swelling     Physical  Exam:   Vitals  Blood pressure 137/66, pulse 86, temperature 99.4 F (37.4 C), temperature source Oral, resp. rate 15, height 6\' 1"  (1.854 m), weight 109.8 kg, SpO2 100 %.  1.  General: Patient lying supine in bed in no acute distress head of bed elevated  2. Psychiatric: Poor historian, alert and oriented x3, cooperative with exam  3. Neurologic: Cranial nerves II through XII are intact, moves all 4 extremities voluntarily, 5 out of 5 strength in the left upper and lower extremities, intention tremor on finger-to-nose test, equal sensation in the upper and lower extremities bilaterally  4. HEENMT:  Head is atraumatic, normocephalic, pupils reactive to light, neck is supple, trachea is midline, mucous membranes are moist  5. Respiratory : Lungs are clear to auscultation bilaterally without wheeze, rhonchi, crackles  6. Cardiovascular : Heart rate is normal, rhythm is regular, systolic murmur present, peripheral pulses palpated, no peripheral edema  7. Gastrointestinal:  Abdomen is obese, soft, nontender to palpation, no masses palpated, bowel sounds active  8. Skin:  Skin is warm dry and intact without acute lesion on limited exam  9.Musculoskeletal:  No calf tenderness, no peripheral edema, no acute deformities    Data Review:    CBC Recent Labs  Lab 02/27/20 0919 03/03/20 1514  WBC 7.6 8.2  HGB 13.5 14.4  HCT 39.5 42.9  PLT 121* 114*  MCV 99* 99.5  MCH 33.8* 33.4  MCHC 34.2 33.6  RDW 13.1 14.8  LYMPHSABS 1.3  --   EOSABS 0.1  --   BASOSABS 0.0  --    ------------------------------------------------------------------------------------------------------------------  Results for orders placed or performed during the hospital encounter of 03/03/20 (from the past 48 hour(s))  Basic metabolic panel     Status: Abnormal   Collection Time: 03/03/20  3:14 PM  Result Value Ref Range   Sodium 130 (L) 135 - 145 mmol/L   Potassium 5.0 3.5 - 5.1 mmol/L   Chloride 92  (L) 98 - 111 mmol/L   CO2 23 22 - 32 mmol/L   Glucose, Bld 176 (H) 70 - 99 mg/dL    Comment: Glucose reference range applies only to samples taken after fasting for at least 8 hours.   BUN 29 (H) 8 - 23 mg/dL   Creatinine, Ser 1.89 (H) 0.61 - 1.24 mg/dL   Calcium 9.1 8.9 - 10.3 mg/dL   GFR, Estimated 36 (L) >60 mL/min    Comment: (NOTE) Calculated using the CKD-EPI Creatinine Equation (2021)    Anion gap 15 5 - 15    Comment: Performed at [facility], [address], [place], [place] [address]  CBC     Status: Abnormal   Collection Time: 03/03/20  3:14 PM  Result Value Ref Range   WBC 8.2 4.0 - 10.5 K/uL   RBC 4.31 4.22 - 5.81 MIL/uL   Hemoglobin 14.4 13.0 - 17.0 g/dL   HCT 42.9 39.0 - 52.0 %   MCV 99.5 80.0 - 100.0 fL   MCH 33.4 26.0 - 34.0 pg   MCHC 33.6 30.0 - 36.0 g/dL   RDW 14.8 11.5 - 15.5 %   Platelets 114 (L) 150 - 400 K/uL    Comment: PLATELET COUNT CONFIRMED BY SMEAR SPECIMEN CHECKED FOR CLOTS Immature Platelet Fraction may be clinically indicated, consider ordering this additional test  [id number]    nRBC 0.0 0.0 - 0.2 %    Comment: Performed at [facility], [address], [place], [place] [address]  CBG monitoring, ED     Status: Abnormal   Collection Time: 03/03/20  5:12 PM  Result Value Ref Range   Glucose-Capillary 197 (H) 70 - 99 mg/dL    Comment: Glucose reference range applies only to samples taken after fasting for at least 8 hours.  Resp Panel by RT-PCR (Flu A&B, Covid) Nasopharyngeal Swab     Status: None   Collection Time: 03/03/20  6:20 PM   Specimen: Nasopharyngeal Swab; Nasopharyngeal(NP) swabs in vial transport medium  Result Value Ref Range   SARS Coronavirus 2 by RT PCR NEGATIVE NEGATIVE    Comment: (NOTE) SARS-CoV-2 target nucleic acids are NOT DETECTED.  The SARS-CoV-2 RNA is generally detectable in upper respiratory specimens during the acute phase of infection. The lowest concentration of SARS-CoV-2 viral copies this assay can detect  is 138 copies/mL. A negative result does not preclude SARS-Cov-2 infection and should not be used as the sole basis for treatment or other patient management decisions. A negative result may occur with  improper specimen collection/handling, submission of specimen other than nasopharyngeal swab, presence of viral mutation(s) within the areas targeted by this assay, and inadequate number of viral copies(<138 copies/mL). A negative result must be combined with clinical observations, patient history, and epidemiological information. The expected result is Negative.  Fact Sheet for Patients:  EntrepreneurPulse.com.au  Fact Sheet for Healthcare Providers:  IncredibleEmployment.be  This test is no t yet approved or cleared by the [place] FDA and  has been authorized for detection and/or diagnosis of SARS-CoV-2 by FDA under an Emergency Use Authorization (EUA). This EUA will remain  in effect (meaning this test can be used) for the duration of the COVID-19 declaration under Section 564(b)(1) of the Act, 21 U.S.C.section 360bbb-3(b)(1), unless the authorization is terminated  or revoked sooner.       Influenza A by PCR NEGATIVE NEGATIVE   Influenza B by PCR NEGATIVE NEGATIVE    Comment: (NOTE) The Xpert Xpress SARS-CoV-2/FLU/RSV plus assay is intended as an aid in the diagnosis of influenza from Nasopharyngeal swab specimens and should not be used as a sole basis for treatment. Nasal washings and aspirates are unacceptable for Xpert Xpress SARS-CoV-2/FLU/RSV testing.  Fact Sheet for Patients: EntrepreneurPulse.com.au  Fact Sheet for Healthcare Providers: IncredibleEmployment.be  This test is not yet approved or cleared by the [place] FDA and has been authorized for detection and/or diagnosis of SARS-CoV-2 by FDA under an Emergency Use Authorization (EUA). This EUA will remain in effect (meaning this  test can be used) for the duration of the COVID-19 declaration under Section 564(b)(1) of the Act, 21 U.S.C. section 360bbb-3(b)(1), unless the authorization is terminated or revoked.  Performed at [facility], [address] [place], [place] [address]   Troponin I (High Sensitivity)     Status: None   Collection Time: 03/03/20  7:30 PM  Result Value Ref Range   Troponin I (High Sensitivity) 16 <18 ng/L    Comment: (NOTE) Elevated high sensitivity troponin I (hsTnI) values and significant  changes across serial measurements may suggest ACS but many other  chronic and acute conditions are known to elevate hsTnI results.  Refer to the "Links" section for chest pain algorithms and additional  guidance. Performed at [facility], [address], [place], [place] [address]    *Note: Due to a large number  of results and/or encounters for the requested time period, some results have not been displayed. A complete set of results can be found in Results Review.    Chemistries  Recent Labs  Lab 02/27/20 0919 03/03/20 1514  NA 126* 130*  K 5.4* 5.0  CL 91* 92*  CO2 20 23  GLUCOSE 230* 176*  BUN 23 29*  CREATININE 1.62* 1.89*  CALCIUM 8.9 9.1  AST 25  --   ALT 22  --   ALKPHOS 75  --   BILITOT 0.4  --    ------------------------------------------------------------------------------------------------------------------  ------------------------------------------------------------------------------------------------------------------ GFR: Estimated Creatinine Clearance: 43.2 mL/min (A) (by C-G formula based on SCr of 1.89 mg/dL (H)). Liver Function Tests: Recent Labs  Lab 02/27/20 0919  AST 25  ALT 22  ALKPHOS 75  BILITOT 0.4  PROT 6.6  ALBUMIN 3.9   No results for input(s): LIPASE, AMYLASE in the last 168 hours. No results for input(s): AMMONIA in the last 168 hours. Coagulation Profile: No results for input(s): INR, PROTIME in the last 168 hours. Cardiac Enzymes: No  results for input(s): CKTOTAL, CKMB, CKMBINDEX, TROPONINI in the last 168 hours. BNP (last 3 results) No results for input(s): PROBNP in the last 8760 hours. HbA1C: No results for input(s): HGBA1C in the last 72 hours. CBG: Recent Labs  Lab 03/03/20 1712  GLUCAP 197*   Lipid Profile: No results for input(s): CHOL, HDL, LDLCALC, TRIG, CHOLHDL, LDLDIRECT in the last 72 hours. Thyroid Function Tests: No results for input(s): TSH, T4TOTAL, FREET4, T3FREE, THYROIDAB in the last 72 hours. Anemia Panel: No results for input(s): VITAMINB12, FOLATE, FERRITIN, TIBC, IRON, RETICCTPCT in the last 72 hours.  --------------------------------------------------------------------------------------------------------------- Urine analysis:    Component Value Date/Time   COLORURINE YELLOW 12/10/2018 0927   APPEARANCEUR Clear 02/27/2020 0922   LABSPEC 1.009 12/10/2018 0927   PHURINE 6.0 12/10/2018 0927   GLUCOSEU Negative 02/27/2020 0922   HGBUR SMALL (A) 12/10/2018 0927   BILIRUBINUR Negative 02/27/2020 0922   KETONESUR negative 08/22/2019 1059   KETONESUR NEGATIVE 12/10/2018 0927   PROTEINUR Negative 02/27/2020 0922   PROTEINUR NEGATIVE 12/10/2018 0927   UROBILINOGEN 0.2 08/22/2019 1059   UROBILINOGEN 0.2 04/06/2014 1410   NITRITE Negative 02/27/2020 0922   NITRITE NEGATIVE 12/10/2018 0927   LEUKOCYTESUR 3+ (A) 02/27/2020 0922   LEUKOCYTESUR LARGE (A) 12/10/2018 0927      Imaging Results:    CT Head Wo Contrast  Result Date: 03/03/2020 CLINICAL DATA:  Neurologic deficit.  Concern for stroke. EXAM: CT HEAD WITHOUT CONTRAST TECHNIQUE: Contiguous axial images were obtained from the base of the skull through the vertex without intravenous contrast. COMPARISON:  Head CT dated 09/16/2019. FINDINGS: Brain: Mild age-related atrophy and chronic microvascular ischemic changes. There is no acute intracranial hemorrhage. No mass effect or midline shift. Stable appearing CSF space in the posterior fossa  may represent dilated and arachnoid cyst or a dilated cisterna magna. Vascular: No hyperdense vessel or unexpected calcification. Skull: Normal. Negative for fracture or focal lesion. Sinuses/Orbits: Mild mucoperiosteal thickening of paranasal sinuses. Partial opacification of the right sphenoid sinus. No air-fluid level. The mastoid air cells are clear. Other: None IMPRESSION: 1. No acute intracranial pathology. 2. Mild age-related atrophy and chronic microvascular ischemic changes. Electronically Signed   By: Anner Crete M.D.   On: 03/03/2020 18:29    My personal review of EKG: Ventricular paced, Rate 76 /min  Assessment & Plan:    Principal Problem:   Ataxia Active Problems:   Essential hypertension   Type  2 diabetes mellitus with diabetic neuropathy, unspecified ([place])   Atrial fibrillation ([place])   1. Ataxia; R/o CVA 1. Patient reports gait disturbance and new tremor 2. CT head = no acute abnormality 3. Echo, MRI brain, monitor on tele 4. PT/OT eval and treat 5. ST eval and treat  6. Continue BP control and statin medication 7. Risk factors: Obesity, HTN, HLD, Afib - but reports compliance with eliquis 8. Monitor on tele 2. Intention tremor 1. No new medication 2. Reportedly new onset 3. CT head = no acute abnormality 4. MRI Brain tomorrow  5. Continue to monitor 3. Afib  1. Continue eliquis 2. Rate controlled; pacemaker 3. Monitor on tele 4. HTN 1. Continue lisinopril 5. DMII 1. Continue long acting insulin, sliding scale coverage 2. Last Hgb A1C 8.1 3. Carb modified diet 6. Hypothyroid 1. Continue synthroid 2. Check TSH   DVT Prophylaxis-   Eliquis - SCDs   AM Labs Ordered, also please review Full Orders  Family Communication: No family at bedside  Code Status:  Full  Admission status: Observation Time spent in minutes : [place] DO

## 2020-03-03 NOTE — ED Provider Notes (Signed)
  Face-to-face evaluation   History: Patient presents for ongoing trouble with tremor of left hand, and difficulty walking.  He states that he "walks sideways."  He further describes he is able to walk but tends to be off balance when walking.  He states this is been going on for at least 3 weeks.  He denies headache, chest pain or shortness of breath.  He recently saw his PCP, 6 days ago, as well as cardiology 7 days ago.  He has atrial fibrillation, and is being monitored closely.  He has a cardiac pacemaker.  There were no changes in his treatment at that time.  Physical exam: Alert obese elderly male.  No dysarthria or aphasia.  Mild left hand tremor, with minimal right hand tremor.  He has abnormal finger pointing with the left hand, greater than the right hand.  No gross ataxia of the legs, with testing.  He is able to walk and support his own weight.  He is not appearing ataxic while walking.  Medical screening examination/treatment/procedure(s) were conducted as a shared visit with non-physician practitioner(s) and myself.  I personally evaluated the patient during the encounter    Daleen Bo, MD 03/04/20 [phone]

## 2020-03-03 NOTE — ED Provider Notes (Signed)
[facility] EMERGENCY DEPARTMENT Provider Note   CSN: [id number] Arrival date & time: 03/03/20  1254     History Chief Complaint  Patient presents with  . Gait Problem    [name] is a 75 y.o. male.  HPI      [name] is a 75 y.o. male with past medical history of atrial fibrillation, (takes Eliquis) hypertension, hypothyroidism and insulin-dependent diabetes who presents to the Emergency Department complaining of unsteady gait and feeling anxious and "jittery."  Symptoms have been present for 3 days.  He states he is "shaking like a leaf on a tree" when he attempts to stand or walk.  He also states that he is unintentionally leaning to his left when he tries to walk.  He denies any recent illness, fever or chills.  No headache, facial weakness, speech difficulties, chest pain or shortness of breath.  He states that he feels his left arm is shaking more than his right.  No alleviating factors.  He was seen by his PCP 6 days ago for decreased appetite and fatigue.    Past Medical History:  Diagnosis Date  . Arthritis   . Atrial fibrillation ([place])   . BPH (benign prostatic hypertrophy) 04/15/2010  . Cataract   . Colon polyps   . DEPRESSION 01/22/2009  . Heart valve replaced   . HYPERCHOLESTEROLEMIA 01/22/2009  . HYPERTENSION 01/22/2009   Dr. [name]  . 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 Morbid obesity ([place])   . Tremors of nervous system    ?ptsd  . Ulcer     Patient Active Problem List   Diagnosis Date Noted  . Rash 01/03/2020  . Severe nonproliferative diabetic retinopathy of both eyes ([place]) 08/27/2019  . 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 Vitreous hemorrhage of left eye ([place]) 08/27/2019  . Posterior vitreous detachment of right eye 08/27/2019  . Severe nonproliferative diabetic retinopathy of right eye ([place]) 08/27/2019  . Frequency of  urination and polyuria 01/29/2019  . Coronary artery disease involving native coronary artery of native heart without angina pectoris 01/27/2019  . Microalbuminuria 12/24/2018  . COPD (chronic obstructive pulmonary disease) ([place]) 06/25/2018  . Thrombocytopenia ([place]) 05/09/2018  . Diabetic neuropathy ([place]) 01/30/2018  . S/P AVR (aortic valve replacement) 11/04/2016  . Atrial fibrillation ([place]) 09/27/2016  . Symptomatic bradycardia 06/12/2016  . Urgency incontinence 11/10/2015  . Erectile dysfunction 11/10/2015  . Hypothyroidism 01/15/2015  . 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 Phimosis 01/15/2015  . CHF (congestive heart failure) ([place]) 01/01/2015  . B12 deficiency 04/08/2014  . 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 Type 2 diabetes mellitus with diabetic neuropathy, unspecified ([place]) 03/12/2014  . 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 GERD (gastroesophageal reflux disease) 11/02/2012  . Dysuria 08/28/2012  . Obesity 08/04/2010  . Benign prostatic hyperplasia 04/15/2010  . CAROTID OCCLUSIVE DISEASE 01/26/2010  . MURMUR 05/27/2009  . Aortic valve disease 05/27/2009  . Hyperlipidemia with target LDL less than 100 01/22/2009  . Depression 01/22/2009  . Essential hypertension 01/22/2009    Past Surgical History:  Procedure Laterality Date  . 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 APPENDECTOMY    . bilateral cataract surg    . COLONOSCOPY  04/24/2007   [name]: normal  . COLONOSCOPY WITH ESOPHAGOGASTRODUODENOSCOPY (EGD) N/A 04/05/2012   Procedure: COLONOSCOPY WITH ESOPHAGOGASTRODUODENOSCOPY (EGD);  Surgeon: Daneil Dolin, MD;  Location: [facility]  ENDO SUITE;  Service: Endoscopy;  Laterality: N/A;  10;15  . DENTAL SURGERY  05/2004   Dental extractions  . EYE SURGERY Bilateral    cataract  . HEEL SPUR SURGERY Bilateral    resection of heel spur  . KNEE ARTHROSCOPY WITH LATERAL MENISECTOMY Right 04/04/2014   Procedure: KNEE ARTHROSCOPY WITH LATERAL  MENISECTOMY;  Surgeon: Carole Civil, MD;  Location: [facility] ORS;  Service: Orthopedics;  Laterality: Right;  . PACEMAKER IMPLANT N/A 06/13/2016   Procedure: Pacemaker Implant- Dual Chamber;  Surgeon: Evans Lance, MD;  Location: [place] [facility];  Service: Cardiovascular;  Laterality: N/A;  . PACEMAKER INSERTION  2018  . THYROIDECTOMY  03/21/2013   DR [name]  . THYROIDECTOMY N/A 03/21/2013   Procedure: THYROIDECTOMY;  Surgeon: [name], MD;  Location: [facility];  Service: General;  Laterality: N/A;  . TONSILLECTOMY         Family History  Problem Relation Age of Onset  . Heart disease Father   . Congestive Heart Failure Father   . Arthritis Father   . Diabetes Other   . Benign prostatic hyperplasia Brother     Social History   Tobacco Use  . Smoking status: Former Smoker    Packs/day: 0.00    Years: 12.00    Pack years: 0.00    Types: Cigarettes    Quit date: 09/20/1961    Years since quitting: 58.4  . Smokeless tobacco: Never Used  Vaping Use  . Vaping Use: Never used  Substance Use Topics  . Alcohol use: Not Currently  . Drug use: No    Home Medications Prior to Admission medications   Medication Sig Start Date End Date Taking? Authorizing Provider  acetaminophen (TYLENOL) 500 MG tablet Take 1,000 mg by mouth every 6 (six) hours as needed (pain).    [provider]  apixaban (ELIQUIS) 5 MG TABS tablet Take 1 tablet (5 mg total) by mouth 2 (two) times daily. 02/17/20   Dettinger, Fransisca Kaufmann, MD  azelastine (ASTELIN) 0.1 % nasal spray Place 1 spray into both nostrils 2 (two) times daily. 04/16/19   Janora Norlander, DO  cholecalciferol (VITAMIN D) 1000 units tablet Take 1,000 Units by mouth daily.     [provider]  fluticasone (FLONASE) 50 MCG/ACT nasal spray INSTILL TWO SPRAYS IN EACH NOSTRIL DAILY 01/10/20   Evelina Dun A, FNP  furosemide (LASIX) 20 MG tablet Take 1 tablet (20 mg total) by mouth daily. 04/17/17   Timmothy Euler, MD   gabapentin (NEURONTIN) 300 MG capsule Take 1 capsule (300 mg total) by mouth 2 (two) times daily. 02/17/20   Dettinger, Fransisca Kaufmann, MD  glucose blood test strip 1 strip by Does not apply route 3 (three) times daily. Uses true metrix strips (diabetic club) 05/12/16   [provider]  insulin detemir (LEVEMIR FLEXTOUCH) 100 UNIT/ML FlexPen Inject 35-40 Units into the skin 2 (two) times daily. 02/17/20   Dettinger, Fransisca Kaufmann, MD  insulin detemir (LEVEMIR) 100 UNIT/ML injection Inject 0.47-0.48 mLs (47-48 Units total) into the skin 2 (two) times daily. Patient taking differently: Inject 38-40 Units into the skin 2 (two) times daily. 07/04/16   Cherre Robins, PharmD  levothyroxine (SYNTHROID) 175 MCG tablet Take 1 tablet (175 mcg total) by mouth daily before breakfast. 02/17/20   Dettinger, Fransisca Kaufmann, MD  lisinopril (ZESTRIL) 5 MG tablet Take 5 mg by mouth daily.    [provider]  metFORMIN (GLUCOPHAGE) 1000 MG tablet TAKE 1  TABLET BY MOUTH TWICE DAILY WITH A MEAL. Patient taking differently: Take 1,000 mg by mouth in the morning and at bedtime. 07/24/15   Timmothy Euler, MD  Multiple Vitamins-Minerals (MENS MULTI VITAMIN & MINERAL PO) Take 1 tablet by mouth daily.      [provider]  Semaglutide, 1 MG/DOSE, (OZEMPIC, 1 MG/DOSE,) 2 MG/1.5ML SOPN Inject 1 mg into the skin once a week. 02/17/20   Dettinger, Fransisca Kaufmann, MD  simvastatin (ZOCOR) 80 MG tablet Take 0.5 tablets (40 mg total) by mouth at bedtime. 04/04/17   Timmothy Euler, MD  sulfamethoxazole-trimethoprim (BACTRIM DS) 800-160 MG tablet Take 1 tablet by mouth 2 (two) times daily. Until gone, for infection 02/29/20   Claretta Fraise, MD  tamsulosin (FLOMAX) 0.4 MG CAPS capsule Take 2 capsules (0.8 mg total) by mouth at bedtime. 10/24/19   Dettinger, Fransisca Kaufmann, MD  traMADol (ULTRAM) 50 MG tablet Take 50 mg by mouth 2 (two) times daily. 08/09/18   [provider]  triamcinolone (KENALOG) 0.1 % Apply 1 application  topically 2 (two) times daily. 01/03/20   Ivy Lynn, NP    Allergies    Phenothiazines and Actos [pioglitazone]  Review of Systems   Review of Systems  Constitutional: Positive for fatigue. Negative for chills and fever.  HENT: Negative for sore throat and trouble swallowing.   Eyes: Negative for visual disturbance.  Respiratory: Negative for cough and shortness of breath.   Cardiovascular: Negative for chest pain and palpitations.  Gastrointestinal: Negative for abdominal pain, diarrhea, nausea and vomiting.  Genitourinary: Negative for dysuria, flank pain and hematuria.  Musculoskeletal: Negative for arthralgias, back pain, myalgias, neck pain and neck stiffness.  Skin: Negative for rash.  Neurological: Positive for tremors and weakness. Negative for dizziness, facial asymmetry, numbness and headaches.  Hematological: Does not bruise/bleed easily.  Psychiatric/Behavioral: Negative for confusion.    Physical Exam Updated Vital Signs BP (!) 162/72   Pulse 89   Temp 99.4 F (37.4 C) (Oral)   Resp 17   Ht 6\' 1"  (1.854 m)   Wt 109.8 kg   SpO2 99%   BMI 31.93 kg/m   Physical Exam Vitals and nursing note reviewed.  Constitutional:      Appearance: Normal appearance. He is not ill-appearing.  HENT:     Head: Normocephalic.  Eyes:     Pupils: Pupils are equal, round, and reactive to light.  Neck:     Thyroid: No thyromegaly.     Meningeal: Kernig's sign absent.  Cardiovascular:     Rate and Rhythm: Normal rate and regular rhythm.     Heart sounds: Normal heart sounds.  Pulmonary:     Effort: Pulmonary effort is normal.     Breath sounds: Normal breath sounds. No wheezing.  Abdominal:     Palpations: Abdomen is soft.     Tenderness: There is no abdominal tenderness. There is no guarding or rebound.  Musculoskeletal:        General: Normal range of motion.     Cervical back: Normal range of motion and neck supple.     Right lower leg: No edema.     Left lower  leg: No edema.  Skin:    General: Skin is warm.     Capillary Refill: Capillary refill takes less than 2 seconds.     Findings: No rash.  Neurological:     Mental Status: He is alert and oriented to person, place, and time.     GCS:  GCS eye subscore is 4. GCS verbal subscore is 5. GCS motor subscore is 6.     Sensory: Sensation is intact.     Motor: Tremor present. No weakness or pronator drift.     Coordination: Heel to Shin Test normal.     Comments: CN II-XII intact.  Speech clear, no pronator drift, no extremity weakness.  Bilateral upper extremities are tremulous, left greater than right.  Diminished coordination with finger-nose testing on the left. Mild ataxic gait.       ED Results / Procedures / Treatments   Labs (all labs ordered are listed, but only abnormal results are displayed) Labs Reviewed  BASIC METABOLIC PANEL - Abnormal; Notable for the following components:      Result Value   Sodium 130 (*)    Chloride 92 (*)    Glucose, Bld 176 (*)    BUN 29 (*)    Creatinine, Ser 1.89 (*)    GFR, Estimated 36 (*)    All other components within normal limits  CBC - Abnormal; Notable for the following components:   Platelets 114 (*)    All other components within normal limits  GLUCOSE, CAPILLARY - Abnormal; Notable for the following components:   Glucose-Capillary 186 (*)    All other components within normal limits  CBG MONITORING, ED - Abnormal; Notable for the following components:   Glucose-Capillary 197 (*)    All other components within normal limits  TROPONIN I (HIGH SENSITIVITY) - Abnormal; Notable for the following components:   Troponin I (High Sensitivity) 19 (*)    All other components within normal limits  RESP PANEL BY RT-PCR (FLU A&B, COVID) ARPGX2  MAGNESIUM  URINALYSIS, ROUTINE W REFLEX MICROSCOPIC  TSH  LIPID PANEL  TROPONIN I (HIGH SENSITIVITY)    EKG None  Radiology CT Head Wo Contrast  Result Date: 03/03/2020 CLINICAL DATA:  Neurologic  deficit.  Concern for stroke. EXAM: CT HEAD WITHOUT CONTRAST TECHNIQUE: Contiguous axial images were obtained from the base of the skull through the vertex without intravenous contrast. COMPARISON:  Head CT dated 09/16/2019. FINDINGS: Brain: Mild age-related atrophy and chronic microvascular ischemic changes. There is no acute intracranial hemorrhage. No mass effect or midline shift. Stable appearing CSF space in the posterior fossa may represent dilated and arachnoid cyst or a dilated cisterna magna. Vascular: No hyperdense vessel or unexpected calcification. Skull: Normal. Negative for fracture or focal lesion. Sinuses/Orbits: Mild mucoperiosteal thickening of paranasal sinuses. Partial opacification of the right sphenoid sinus. No air-fluid level. The mastoid air cells are clear. Other: None IMPRESSION: 1. No acute intracranial pathology. 2. Mild age-related atrophy and chronic microvascular ischemic changes. Electronically Signed   By: Anner Crete M.D.   On: 03/03/2020 18:29    Procedures Procedures   Medications Ordered in ED Medications - No data to display  ED Course  I have reviewed the triage vital signs and the nursing notes.  Pertinent labs & imaging results that were available during my care of the patient were reviewed by me and considered in my medical decision making (see chart for details).    MDM Rules/Calculators/A&P                          Patient here with ataxia for 3 days and tremulous.  Seen by PCP 6 days ago for decreased appetite and fatigue.  Initially patient stated to me that symptoms were present for 3 days, but told Dr. Eulis Foster who  also saw patient that sx's present for 3 weeks.   On exam, he is ambulated in the department.  His gait is slightly ataxic.  He has diminished coordination with finger-nose testing more on left than right.  He appears slightly tremulous.  Labs interpreted by me, no leukocytosis, kidney functions appear baseline.  He does have  hyponatremia which also appears baseline.  Covid test negative in initial troponin reassuring.  No acute EKG changes.  CT of the head is without acute changes.  Patient does have symptoms suggestive of possible TIA versus CVA.  Doubt infectious process.  Patient would benefit from MRI which is unavailable after hours.  He does live by himself.  Patient agreeable to admission and MRI in the morning.  Discussed findings with triad hospitalist, Dr. Clearence Ped who agrees to admit.    Final Clinical Impression(s) / ED Diagnoses Final diagnoses:  Ataxia    Rx / DC Orders ED Discharge Orders    None       Kem Parkinson, PA-C 03/04/20 0014    Daleen Bo, MD 03/04/20 1408

## 2020-03-03 NOTE — ED Notes (Signed)
ED TO INPATIENT HANDOFF REPORT  ED Nurse Name and Phone #:   S Name/Age/Gender [name] 75 y.o. male Room/Bed: APA01/APA01  Code Status   Code Status: Prior  Home/SNF/Other Home Patient oriented to: self, place, time and situation Is this baseline? Yes   Triage Complete: Triage complete  Chief Complaint Ataxia [R27.0]  Triage Note Difficulty walking for 3 days, states he is shaking like a leaf on a tree    Allergies Allergies  Allergen Reactions  . Phenothiazines Anaphylaxis  . Actos [Pioglitazone] Swelling    Level of Care/Admitting Diagnosis ED Disposition    ED Disposition Condition [facility] [[id number]]  Level of Care: Telemetry [5]  Covid Evaluation: Confirmed COVID Negative  Diagnosis: Ataxia [025852]  Admitting Physician: Rolla Plate [[id number]]  Attending Physician: Rolla Plate [[id number]]       B Medical/Surgery History Past Medical History:  Diagnosis Date  . Arthritis   . Atrial fibrillation ([place])   . BPH (benign prostatic hypertrophy) 04/15/2010  . Cataract   . Colon polyps   . DEPRESSION 01/22/2009  . Heart valve replaced   . HYPERCHOLESTEROLEMIA 01/22/2009  . HYPERTENSION 01/22/2009   Dr. [name]  . 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 Morbid obesity ([place])   . Tremors of nervous system    ?ptsd  . Ulcer    Past Surgical History:  Procedure Laterality Date  . 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 APPENDECTOMY    . bilateral cataract surg    . COLONOSCOPY  04/24/2007   [name]: normal  . COLONOSCOPY WITH ESOPHAGOGASTRODUODENOSCOPY (EGD) N/A 04/05/2012   Procedure: COLONOSCOPY WITH ESOPHAGOGASTRODUODENOSCOPY (EGD);  Surgeon: Daneil Dolin, MD;  [facility];  Service: Endoscopy;  Laterality: N/A;  10;15  . DENTAL SURGERY  05/2004   Dental extractions  . EYE SURGERY Bilateral    cataract  .  HEEL SPUR SURGERY Bilateral    resection of heel spur  . KNEE ARTHROSCOPY WITH LATERAL MENISECTOMY Right 04/04/2014   Procedure: KNEE ARTHROSCOPY WITH LATERAL MENISECTOMY;  Surgeon: Carole Civil, MD;  Location: [facility];  Service: Orthopedics;  Laterality: Right;  . PACEMAKER IMPLANT N/A 06/13/2016   Procedure: Pacemaker Implant- Dual Chamber;  Surgeon: Evans Lance, MD;  Location: [facility];  Service: Cardiovascular;  Laterality: N/A;  . PACEMAKER INSERTION  2018  . THYROIDECTOMY  03/21/2013   DR [name]  . THYROIDECTOMY N/A 03/21/2013   Procedure: THYROIDECTOMY;  Surgeon: [name], MD;  Location: [facility];  Service: General;  Laterality: N/A;  . TONSILLECTOMY       A IV Location/Drains/Wounds Patient Lines/Drains/Airways Status    Active Line/Drains/Airways    Name Placement date Placement time Site Days   Incision (Closed) 06/13/16 Chest Left 06/13/16  2145  -- 1359          Intake/Output Last 24 hours No intake or output data in the 24 hours ending 03/03/20 2232  Labs/Imaging Results for orders placed or performed during the hospital encounter of 03/03/20 (from the past 48 hour(s))  Basic metabolic panel     Status: Abnormal   Collection Time: 03/03/20  3:14 PM  Result Value Ref Range   Sodium 130 (L) 135 - 145 mmol/L   Potassium 5.0 3.5 - 5.1 mmol/L   Chloride 92 (L) 98 - 111 mmol/L   CO2 23 22 - 32  mmol/L   Glucose, Bld 176 (H) 70 - 99 mg/dL    Comment: Glucose reference range applies only to samples taken after fasting for at least 8 hours.   BUN 29 (H) 8 - 23 mg/dL   Creatinine, Ser 1.89 (H) 0.61 - 1.24 mg/dL   Calcium 9.1 8.9 - 10.3 mg/dL   GFR, Estimated 36 (L) >60 mL/min    Comment: (NOTE) Calculated using the CKD-EPI Creatinine Equation (2021)    Anion gap 15 5 - 15    Comment: Performed at [facility] [place], [address] [place], [place] [address]  CBC     Status: Abnormal   Collection Time: 03/03/20  3:14 PM  Result Value Ref Range   WBC 8.2  4.0 - 10.5 K/uL   RBC 4.31 4.22 - 5.81 MIL/uL   Hemoglobin 14.4 13.0 - 17.0 g/dL   HCT 42.9 39.0 - 52.0 %   MCV 99.5 80.0 - 100.0 fL   MCH 33.4 26.0 - 34.0 pg   MCHC 33.6 30.0 - 36.0 g/dL   RDW 14.8 11.5 - 15.5 %   Platelets 114 (L) 150 - 400 K/uL    Comment: PLATELET COUNT CONFIRMED BY SMEAR SPECIMEN CHECKED FOR CLOTS Immature Platelet Fraction may be clinically indicated, consider ordering this additional test [id number]    nRBC 0.0 0.0 - 0.2 %    Comment: Performed at [facility], [address] [place], [place] [address]  CBG monitoring, ED     Status: Abnormal   Collection Time: 03/03/20  5:12 PM  Result Value Ref Range   Glucose-Capillary 197 (H) 70 - 99 mg/dL    Comment: Glucose reference range applies only to samples taken after fasting for at least 8 hours.  Resp Panel by RT-PCR (Flu A&B, Covid) Nasopharyngeal Swab     Status: None   Collection Time: 03/03/20  6:20 PM   Specimen: Nasopharyngeal Swab; Nasopharyngeal(NP) swabs in vial transport medium  Result Value Ref Range   SARS Coronavirus 2 by RT PCR NEGATIVE NEGATIVE    Comment: (NOTE) SARS-CoV-2 target nucleic acids are NOT DETECTED.  The SARS-CoV-2 RNA is generally detectable in upper respiratory specimens during the acute phase of infection. The lowest concentration of SARS-CoV-2 viral copies this assay can detect is 138 copies/mL. A negative result does not preclude SARS-Cov-2 infection and should not be used as the sole basis for treatment or other patient management decisions. A negative result may occur with  improper specimen collection/handling, submission of specimen other than nasopharyngeal swab, presence of viral mutation(s) within the areas targeted by this assay, and inadequate number of viral copies(<138 copies/mL). A negative result must be combined with clinical observations, patient history, and epidemiological information. The expected result is Negative.  Fact Sheet for Patients:   EntrepreneurPulse.com.au  Fact Sheet for Healthcare Providers:  IncredibleEmployment.be  This test is no t yet approved or cleared by the [place] FDA and  has been authorized for detection and/or diagnosis of SARS-CoV-2 by FDA under an Emergency Use Authorization (EUA). This EUA will remain  in effect (meaning this test can be used) for the duration of the COVID-19 declaration under Section 564(b)(1) of the Act, 21 U.S.C.section 360bbb-3(b)(1), unless the authorization is terminated  or revoked sooner.       Influenza A by PCR NEGATIVE NEGATIVE   Influenza B by PCR NEGATIVE NEGATIVE    Comment: (NOTE) The Xpert Xpress SARS-CoV-2/FLU/RSV plus assay is intended as an aid in the diagnosis of influenza from Nasopharyngeal swab  specimens and should not be used as a sole basis for treatment. Nasal washings and aspirates are unacceptable for Xpert Xpress SARS-CoV-2/FLU/RSV testing.  Fact Sheet for Patients: EntrepreneurPulse.com.au  Fact Sheet for Healthcare Providers: IncredibleEmployment.be  This test is not yet approved or cleared by the [place] FDA and has been authorized for detection and/or diagnosis of SARS-CoV-2 by FDA under an Emergency Use Authorization (EUA). This EUA will remain in effect (meaning this test can be used) for the duration of the COVID-19 declaration under Section 564(b)(1) of the Act, 21 U.S.C. section 360bbb-3(b)(1), unless the authorization is terminated or revoked.  Performed at [facility], [address], [place], [place] [address]   Troponin I (High Sensitivity)     Status: None   Collection Time: 03/03/20  7:30 PM  Result Value Ref Range   Troponin I (High Sensitivity) 16 <18 ng/L    Comment: (NOTE) Elevated high sensitivity troponin I (hsTnI) values and significant  changes across serial measurements may suggest ACS but many other  chronic and acute conditions  are known to elevate hsTnI results.  Refer to the "Links" section for chest pain algorithms and additional  guidance. Performed at [facility], [address], [place], [place] [address]    *Note: Due to a large number of results and/or encounters for the requested time period, some results have not been displayed. A complete set of results can be found in Results Review.   CT Head Wo Contrast  Result Date: 03/03/2020 CLINICAL DATA:  Neurologic deficit.  Concern for stroke. EXAM: CT HEAD WITHOUT CONTRAST TECHNIQUE: Contiguous axial images were obtained from the base of the skull through the vertex without intravenous contrast. COMPARISON:  Head CT dated 09/16/2019. FINDINGS: Brain: Mild age-related atrophy and chronic microvascular ischemic changes. There is no acute intracranial hemorrhage. No mass effect or midline shift. Stable appearing CSF space in the posterior fossa may represent dilated and arachnoid cyst or a dilated cisterna magna. Vascular: No hyperdense vessel or unexpected calcification. Skull: Normal. Negative for fracture or focal lesion. Sinuses/Orbits: Mild mucoperiosteal thickening of paranasal sinuses. Partial opacification of the right sphenoid sinus. No air-fluid level. The mastoid air cells are clear. Other: None IMPRESSION: 1. No acute intracranial pathology. 2. Mild age-related atrophy and chronic microvascular ischemic changes. Electronically Signed   By: Anner Crete M.D.   On: 03/03/2020 18:29    Pending Labs Unresulted Labs (From admission, onward)          Start     Ordered   03/04/20 0500  TSH  Tomorrow morning,   R        03/03/20 2229   03/03/20 1358  Urinalysis, Routine w reflex microscopic  Once,   STAT        03/03/20 1358   Signed and Held  Lipid panel  (Labs)  Tomorrow morning,   R       Comments: Fasting    Signed and Held   Signed and Held  Magnesium  Once,   R        Signed and Held          Vitals/Pain Today's Vitals   03/03/20 2000  03/03/20 2030 03/03/20 2100 03/03/20 2130  BP: (!) 141/57 129/63 131/69 137/66  Pulse: 67 86 87 86  Resp: 15 14 15 15   Temp:      TempSrc:      SpO2: 100% 100% 100% 100%  Weight:      Height:  Isolation Precautions Airborne and Contact precautions  Medications Medications - No data to display  Mobility  Low fall risk   Focused Assessments    R Recommendations: See Admitting Provider Note  Report given to:   Additional Notes:

## 2020-03-03 NOTE — ED Notes (Signed)
Ambulated pt. In room. Pt. Walked toward Dr. Eulis Foster and was able to stand and support their weight.

## 2020-03-04 ENCOUNTER — Observation Stay (HOSPITAL_COMMUNITY): Payer: No Typology Code available for payment source

## 2020-03-04 ENCOUNTER — Other Ambulatory Visit (HOSPITAL_COMMUNITY): Payer: No Typology Code available for payment source

## 2020-03-04 ENCOUNTER — Observation Stay (HOSPITAL_BASED_OUTPATIENT_CLINIC_OR_DEPARTMENT_OTHER): Payer: No Typology Code available for payment source

## 2020-03-04 DIAGNOSIS — Z794 Long term (current) use of insulin: Secondary | ICD-10-CM

## 2020-03-04 DIAGNOSIS — I10 Essential (primary) hypertension: Secondary | ICD-10-CM

## 2020-03-04 DIAGNOSIS — R270 Ataxia, unspecified: Secondary | ICD-10-CM

## 2020-03-04 DIAGNOSIS — I480 Paroxysmal atrial fibrillation: Secondary | ICD-10-CM

## 2020-03-04 DIAGNOSIS — E1140 Type 2 diabetes mellitus with diabetic neuropathy, unspecified: Secondary | ICD-10-CM

## 2020-03-04 LAB — TSH: TSH: 1.557 u[IU]/mL (ref 0.350–4.500)

## 2020-03-04 LAB — ECHOCARDIOGRAM COMPLETE
AV Mean grad: 14.8 mmHg
AV Peak grad: 26.3 mmHg
Ao pk vel: 2.57 m/s
Area-P 1/2: 2.96 cm2
Height: 73 in
S' Lateral: 2.77 cm
Weight: 3872 oz

## 2020-03-04 LAB — GLUCOSE, CAPILLARY
Glucose-Capillary: 128 mg/dL — ABNORMAL HIGH (ref 70–99)
Glucose-Capillary: 178 mg/dL — ABNORMAL HIGH (ref 70–99)
Glucose-Capillary: 176 mg/dL — ABNORMAL HIGH (ref 70–99)
Glucose-Capillary: 121 mg/dL — ABNORMAL HIGH (ref 70–99)

## 2020-03-04 LAB — LIPID PANEL
Cholesterol: 135 mg/dL (ref 0–200)
HDL: 46 mg/dL (ref >40)
LDL Cholesterol: 74 mg/dL (ref 0–99)
Total CHOL/HDL Ratio: 2.9 RATIO
Triglycerides: 75 mg/dL (ref <150)
VLDL: 15 mg/dL (ref 0–40)

## 2020-03-04 MED ORDER — PERFLUTREN LIPID MICROSPHERE
1.0000 mL | INTRAVENOUS | Status: AC | PRN
  Administered 2020-03-04: 1 mL via INTRAVENOUS
  Filled 2020-03-04: qty 10

## 2020-03-04 NOTE — Progress Notes (Signed)
*  PRELIMINARY RESULTS* Echocardiogram 2D Echocardiogram with definity has been performed.  [name] 03/04/2020, 4:26 PM

## 2020-03-04 NOTE — Evaluation (Signed)
Speech Language Pathology Evaluation Patient Details Name: [name] MRN: [id number] DOB: Dec 06, 1945 Today's Date: 03/04/2020 Time: 1448-1856 SLP Time Calculation (min) (ACUTE ONLY): 17 min  Problem List:  Patient Active Problem List   Diagnosis Date Noted  . Ataxia 03/03/2020  . Rash 01/03/2020  . Severe nonproliferative diabetic retinopathy of both eyes ([place]) 08/27/2019  . 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 Vitreous hemorrhage of left eye ([place]) 08/27/2019  . Posterior vitreous detachment of right eye 08/27/2019  . Severe nonproliferative diabetic retinopathy of right eye ([place]) 08/27/2019  . Frequency of urination and polyuria 01/29/2019  . Coronary artery disease involving native coronary artery of native heart without angina pectoris 01/27/2019  . Microalbuminuria 12/24/2018  . COPD (chronic obstructive pulmonary disease) ([place]) 06/25/2018  . Thrombocytopenia ([place]) 05/09/2018  . Diabetic neuropathy ([place]) 01/30/2018  . S/P AVR (aortic valve replacement) 11/04/2016  . Atrial fibrillation ([place]) 09/27/2016  . Symptomatic bradycardia 06/12/2016  . Urgency incontinence 11/10/2015  . Erectile dysfunction 11/10/2015  . Hypothyroidism 01/15/2015  . 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 Phimosis 01/15/2015  . CHF (congestive heart failure) ([place]) 01/01/2015  . B12 deficiency 04/08/2014  . 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 Type 2 diabetes mellitus with diabetic neuropathy, unspecified ([place]) 03/12/2014  . 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 GERD (gastroesophageal reflux disease) 11/02/2012  . Dysuria 08/28/2012  . Obesity 08/04/2010  . Benign prostatic hyperplasia 04/15/2010  . CAROTID OCCLUSIVE DISEASE 01/26/2010  . MURMUR 05/27/2009  . Aortic valve disease 05/27/2009  . Hyperlipidemia with target LDL less than 100 01/22/2009  . Depression 01/22/2009  . Essential hypertension 01/22/2009    Past Medical History:  Past Medical History:  Diagnosis Date  . Arthritis   . Atrial fibrillation ([place])   . BPH (benign prostatic hypertrophy) 04/15/2010  . Cataract   . Colon polyps   . DEPRESSION 01/22/2009  . Heart valve replaced   . HYPERCHOLESTEROLEMIA 01/22/2009  . HYPERTENSION 01/22/2009   Dr. [name]  . 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 Morbid obesity ([place])   . Tremors of nervous system    ?ptsd  . Ulcer    Past Surgical History:  Past Surgical History:  Procedure Laterality Date  . 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 APPENDECTOMY    . bilateral cataract surg    . COLONOSCOPY  04/24/2007   [name]: normal  . COLONOSCOPY WITH ESOPHAGOGASTRODUODENOSCOPY (EGD) N/A 04/05/2012   Procedure: COLONOSCOPY WITH ESOPHAGOGASTRODUODENOSCOPY (EGD);  Surgeon: Daneil Dolin, MD;  [facility];  Service: Endoscopy;  Laterality: N/A;  10;15  . DENTAL SURGERY  05/2004   Dental extractions  . EYE SURGERY Bilateral    cataract  . HEEL SPUR SURGERY Bilateral    resection of heel spur  . KNEE ARTHROSCOPY WITH LATERAL MENISECTOMY Right 04/04/2014   Procedure: KNEE ARTHROSCOPY WITH LATERAL MENISECTOMY;  Surgeon: Carole Civil, MD;  Location: [facility];  Service: Orthopedics;  Laterality: Right;  . PACEMAKER IMPLANT N/A 06/13/2016   Procedure: Pacemaker Implant- Dual Chamber;  Surgeon: Evans Lance, MD;  Location: [facility];  Service: Cardiovascular;  Laterality: N/A;  . PACEMAKER INSERTION  2018  . THYROIDECTOMY  03/21/2013   DR [name]  . THYROIDECTOMY N/A 03/21/2013   Procedure: THYROIDECTOMY;  Surgeon: [name], MD;  Location: [facility];  Service: General;  Laterality: N/A;  . TONSILLECTOMY  HPI:  75 year old male past medical history for obesity, type II Titus mellitus, hypothyroidism, hypertension, dyslipidemia, depression, atrial fibrillation status post AICD, and BPH who presented  with tremors.  Symptom onset for about 3 weeks, consistent with tremors, bilaterally and constant, associated with ambulatory dysfunction. SLE requested. CT negative for acute changes. MRI pending.   Assessment / Plan / Recommendation Clinical Impression  Pt's cognitive linguistic skills appear to be at baseline and WNL for items assessed. Pt lvies alone in a mobile home, has an estranged child in [place]. He indicates that he drives, cares for himself, does grocery shopping, and manages medications and finances independently. No further SLP services indicated at this time. SLP will sign off.    SLP Assessment  SLP Recommendation/Assessment: Patient does not need any further Speech Lanaguage Pathology Services SLP Visit Diagnosis: Cognitive communication deficit (R41.841)    Follow Up Recommendations  None    Frequency and Duration           SLP Evaluation Cognition  Overall Cognitive Status: Within Functional Limits for tasks assessed Arousal/Alertness: Awake/alert Orientation Level: Oriented X4 Memory: Appears intact Awareness: Appears intact Problem Solving: Appears intact Safety/Judgment: Appears intact       Comprehension  Auditory Comprehension Overall Auditory Comprehension: Appears within functional limits for tasks assessed Yes/No Questions: Within Functional Limits Commands: Within Functional Limits Conversation: Complex Interfering Components: Hearing EffectiveTechniques: Increased volume Visual Recognition/Discrimination Discrimination: Within Function Limits Reading Comprehension Reading Status: Within funtional limits (Pt enjoys reading western novels)    Expression Expression Primary Mode of Expression: Verbal Verbal Expression Overall Verbal Expression: Appears within functional limits for tasks assessed Initiation: No impairment Automatic Speech: Name;Social Response Level of Generative/Spontaneous Verbalization: Conversation Repetition: No  impairment Naming: No impairment Pragmatics: No impairment Non-Verbal Means of Communication: Not applicable Written Expression Dominant Hand: Right Written Expression: Not tested   Oral / Motor  Oral Motor/Sensory Function Overall Oral Motor/Sensory Function: Within functional limits Motor Speech Overall Motor Speech: Appears within functional limits for tasks assessed Respiration: Within functional limits Phonation: Normal Resonance: Within functional limits Articulation: Within functional limitis Intelligibility: Intelligible Motor Planning: Witnin functional limits Motor Speech Errors: Not applicable   Thank you,  [name], [place]                     [name] 03/04/2020, 11:06 AM

## 2020-03-04 NOTE — Plan of Care (Signed)
  Problem: Acute Rehab PT Goals(only PT should resolve) Goal: Pt Will Go Supine/Side To Sit Outcome: Progressing Flowsheets (Taken 03/04/2020 1044) Pt will go Supine/Side to Sit:  Independently  with modified independence Goal: Patient Will Transfer Sit To/From Stand Outcome: Progressing Flowsheets (Taken 03/04/2020 1044) Patient will transfer sit to/from stand:  Independently  with modified independence Goal: Pt Will Transfer Bed To Chair/Chair To Bed Outcome: Progressing Flowsheets (Taken 03/04/2020 1044) Pt will Transfer Bed to Chair/Chair to Bed:  Independently  with modified independence Goal: Pt Will Ambulate Outcome: Progressing Flowsheets (Taken 03/04/2020 1044) Pt will Ambulate:  100 feet  with modified independence  with least restrictive assistive device   10:44 AM, 03/04/20 Lonell Grandchild, MPT Physical Therapist with [facility] [phone] office [phone] mobile phone

## 2020-03-04 NOTE — Progress Notes (Signed)
PROGRESS NOTE    [name]  QMG:[id number] DOB: 11/12/1945 DOA: 03/03/2020 PCP: Dettinger, Fransisca Kaufmann, MD    Brief Narrative:  [name] was admitted to the hospital with the working diagnosis of ataxia.  75 year old male past medical history for obesity, type II Titus mellitus, hypothyroidism, hypertension, dyslipidemia, depression, atrial fibrillation status post AICD, and BPH who presented with tremors.  Symptom onset for about 3 weeks, consistent with tremors, bilaterally and constant, associated with ambulatory dysfunction. On his initial physical examination temperature 99.4, heart rate 53-89, respiratory 13-18, blood pressure 137/66, neurologically patient nonfocal, positive intentional tremor, lungs clear to auscultation bilaterally, heart S1-S2, present rhythmic, soft abdomen, no lower extremity edema.  Sodium 130, potassium 5.0, chloride 92, bicarb 23, glucose 176, BUN 29, creatinine 1.89, white count 8.2, hemoglobin 14.4, hematocrit 42.9, platelets 114. SARS COVID-19 negative.  Head CT no acute changes.  EKG 76 bpm, left axis deviation, prolonged QRS, atrial-ventricular paced, lateral T wave inversions.  Assessment & Plan:   Principal Problem:   Ataxia Active Problems:   Essential hypertension   Type 2 diabetes mellitus with diabetic neuropathy, unspecified (HCC)   Atrial fibrillation ([place])   1. Worsening ataxia with ambulatory dysfunction. Patient has chronic tremors, predominantly intentional.  New ambulatory dysfunction, leaning to the left side.   This am he feels better but not yet back to his baseline. Not able to get brain MRI due to AICD.  Will consult neurology, for further recommendations. Possible further work up as outpatient.   2. HTN Continue blood pressure control with lisinopril.   3. T2DM/ dyslipidemia. Continue glucose control with insulin sliding scale, continue basal insulin 30 units bid.   Continue with atorvastatin.   4. Atrial  fibrillation. Paroxysmal. Continue anticoagulation with apixaban.   5. CKD stage 3a. Renal function stable with serum cr at 1,89 from 1,62, K is 5,0 and bicarbonate is 23.   Status is: Observation  The patient remains OBS appropriate and will d/c before 2 midnights.  Dispo: The patient is from: Home              Anticipated d/c is to: Home              Anticipated d/c date is: 1 day              Patient currently is not medically stable to d/c.   Difficult to place patient No   DVT prophylaxis: apixaban   Code Status:   full  Family Communication:  No family at the bedside      Consultants:   Neurology    Subjective: Patient feels better, tremors are improving but not yet back to baseline, no nausea or vomiting.   Objective: Vitals:   03/03/20 2100 03/03/20 2130 03/03/20 2352 03/04/20 0530  BP: 131/69 137/66 (!) 155/54 (!) 156/68  Pulse: 87 86 86 77  Resp: 15 15 16 16   Temp:      TempSrc:      SpO2: 100% 100% 100% 99%  Weight:      Height:        Intake/Output Summary (Last 24 hours) at 03/04/2020 0950 Last data filed at 03/04/2020 0900 Gross per 24 hour  Intake 240 ml  Output 300 ml  Net -60 ml   Filed Weights   03/03/20 1355  Weight: 109.8 kg    Examination:   General: Not in pain or dyspnea, deconditioned  Neurology: Awake and alert, positive intentional tremors, no rigidity.  E ENT: no  pallor, no icterus, oral mucosa moist Cardiovascular: No JVD. S1-S2 present, rhythmic, no gallops, rubs, or murmurs. No lower extremity edema. Pulmonary: positive breath sounds bilaterally, adequate air movement, no wheezing, rhonchi or rales. Gastrointestinal. Abdomen soft and non tender Skin. No rashes Musculoskeletal: no joint deformities     Data Reviewed: I have personally reviewed following labs and imaging studies  CBC: Recent Labs  Lab 02/27/20 0919 03/03/20 1514  WBC 7.6 8.2  NEUTROABS 5.4  --   HGB 13.5 14.4  HCT 39.5 42.9  MCV 99* 99.5  PLT 121*  696*   Basic Metabolic Panel: Recent Labs  Lab 02/27/20 0919 03/03/20 1514 03/03/20 2222  NA 126* 130*  --   K 5.4* 5.0  --   CL 91* 92*  --   CO2 20 23  --   GLUCOSE 230* 176*  --   BUN 23 29*  --   CREATININE 1.62* 1.89*  --   CALCIUM 8.9 9.1  --   MG  --   --  2.3   GFR: Estimated Creatinine Clearance: 43.2 mL/min (A) (by C-G formula based on SCr of 1.89 mg/dL (H)). Liver Function Tests: Recent Labs  Lab 02/27/20 0919  AST 25  ALT 22  ALKPHOS 75  BILITOT 0.4  PROT 6.6  ALBUMIN 3.9   No results for input(s): LIPASE, AMYLASE in the last 168 hours. No results for input(s): AMMONIA in the last 168 hours. Coagulation Profile: No results for input(s): INR, PROTIME in the last 168 hours. Cardiac Enzymes: No results for input(s): CKTOTAL, CKMB, CKMBINDEX, TROPONINI in the last 168 hours. BNP (last 3 results) No results for input(s): PROBNP in the last 8760 hours. HbA1C: No results for input(s): HGBA1C in the last 72 hours. CBG: Recent Labs  Lab 03/03/20 1712 03/03/20 2351 03/04/20 0731  GLUCAP 197* 186* 128*   Lipid Profile: Recent Labs    03/04/20 0509  CHOL 135  HDL 46  LDLCALC 74  TRIG 75  CHOLHDL 2.9   Thyroid Function Tests: Recent Labs    03/03/20 2222  TSH 1.557   Anemia Panel: No results for input(s): VITAMINB12, FOLATE, FERRITIN, TIBC, IRON, RETICCTPCT in the last 72 hours.    Radiology Studies: I have reviewed all of the imaging during this hospital visit personally     Scheduled Meds: .  stroke: mapping our early stages of recovery book   Does not apply Once  . apixaban  5 mg Oral BID  . atorvastatin  40 mg Oral Daily  . gabapentin  300 mg Oral BID  . insulin aspart  0-15 Units Subcutaneous TID WC  . insulin aspart  0-5 Units Subcutaneous QHS  . insulin detemir  30 Units Subcutaneous BID  . levothyroxine  175 mcg Oral Q0600  . lisinopril  5 mg Oral Daily  . tamsulosin  0.8 mg Oral QHS   Continuous Infusions:   LOS: 0 days         Favian Kittleson Gerome Apley, MD

## 2020-03-04 NOTE — Evaluation (Signed)
Physical Therapy Evaluation Patient Details Name: [name] MRN: [id number] DOB: 05/28/1945 Today's Date: 03/04/2020   History of Present Illness  [name]  is a 75 y.o. male, with history of " tremors of nervous system," obesity, insulin-dependent diabetes mellitus type 2, hypothyroidism, hypertension, hyperlipidemia, heart valve replaced, depression, BPH, atrial fibrillation status post ICD, presents to the ED with a chief complaint of "walking sideways."  Patient's history is not consistent with past providers.  He reports to few providers at this started 3 days ago, he reports to to other providers including myself that it started 3 weeks ago.  Patient is oriented to person place and time.  He reports that he started "shaking like a leaf."  When he describes the shaking he is talking about his upper extremities only.    Clinical Impression  Patient functioning near baseline for functional mobility and gait demonstrating good return for transferring to chair and commode in bathroom without loss of balance, slightly labored unsteady cadence without use of AD, no loss of balance and limited mostly due to c/o fatigue.  Patient tolerated sitting up in chair after therapy - nursing staff notified.  Patient will benefit from continued physical therapy in hospital and recommended venue below to increase strength, balance, endurance for safe ADLs and gait.      Follow Up Recommendations No PT follow up    Equipment Recommendations  None recommended by PT    Recommendations for Other Services       Precautions / Restrictions Precautions Precautions: None Restrictions Weight Bearing Restrictions: No      Mobility  Bed Mobility Overal bed mobility: Modified Independent                  Transfers Overall transfer level: Needs assistance Equipment used: None Transfers: Sit to/from Stand;Stand Pivot Transfers Sit to Stand: Supervision Stand pivot transfers: Supervision        General transfer comment: slightly labored movement without loss of balance  Ambulation/Gait Ambulation/Gait assistance: Supervision;Modified independent (Device/Increase time) Gait Distance (Feet): 65 Feet   Gait Pattern/deviations: Decreased step length - right;Decreased step length - left;Decreased stride length;Trunk flexed Gait velocity: decreased   General Gait Details: slightly labored cadence without loss of balance, limited mostly due to fatigue  Stairs            Wheelchair Mobility    Modified Rankin (Stroke Patients Only)       Balance Overall balance assessment: Mild deficits observed, not formally tested                                           Pertinent Vitals/Pain Pain Assessment: No/denies pain    Home Living Family/patient expects to be discharged to:: Private residence Living Arrangements: Alone Available Help at Discharge: Other (Comment) (Patient states he has no help) Type of Home: Mobile home Home Access: Stairs to enter Entrance Stairs-Rails: None Entrance Stairs-Number of Steps: 1 Home Layout: One level Home Equipment: Walker - 2 wheels      Prior Function Level of Independence: Independent         Comments: household and short distanced community ambulator, drives     Hand Dominance   Dominant Hand: Right    Extremity/Trunk Assessment   Upper Extremity Assessment Upper Extremity Assessment: Defer to OT evaluation    Lower Extremity Assessment Lower Extremity Assessment: Overall WFL for tasks  assessed    Cervical / Trunk Assessment Cervical / Trunk Assessment: Normal  Communication   Communication: No difficulties  Cognition Arousal/Alertness: Awake/alert Behavior During Therapy: WFL for tasks assessed/performed Overall Cognitive Status: Within Functional Limits for tasks assessed                                        General Comments      Exercises      Assessment/Plan    PT Assessment Patient needs continued PT services  PT Problem List Decreased strength;Decreased activity tolerance;Decreased balance;Decreased mobility       PT Treatment Interventions Gait training;Stair training;Functional mobility training;Therapeutic activities;Therapeutic exercise;Balance training;Patient/family education    PT Goals (Current goals can be found in the Care Plan section)  Acute Rehab PT Goals Patient Stated Goal: return home PT Goal Formulation: With patient Time For Goal Achievement: 03/06/20 Potential to Achieve Goals: Good    Frequency Min 2X/week   Barriers to discharge        Co-evaluation PT/OT/SLP Co-Evaluation/Treatment: Yes Reason for Co-Treatment: Complexity of the patient's impairments (multi-system involvement) PT goals addressed during session: Mobility/safety with mobility;Balance;Strengthening/ROM OT goals addressed during session: ADL's and self-care       AM-PAC PT "6 Clicks" Mobility  Outcome Measure Help needed turning from your back to your side while in a flat bed without using bedrails?: None Help needed moving from lying on your back to sitting on the side of a flat bed without using bedrails?: None Help needed moving to and from a bed to a chair (including a wheelchair)?: None Help needed standing up from a chair using your arms (e.g., wheelchair or bedside chair)?: None Help needed to walk in hospital room?: A Little Help needed climbing 3-5 steps with a railing? : A Little 6 Click Score: 22    End of Session   Activity Tolerance: Patient tolerated treatment well;Patient limited by fatigue Patient left: in chair;with call bell/phone within reach Nurse Communication: Mobility status PT Visit Diagnosis: Unsteadiness on feet (R26.81);Other abnormalities of gait and mobility (R26.89);Muscle weakness (generalized) (M62.81)    Time: 4680-3212 PT Time Calculation (min) (ACUTE ONLY): 21 min   Charges:    PT Evaluation $PT Eval Moderate Complexity: 1 Mod PT Treatments $Therapeutic Activity: 8-22 mins        10:41 AM, 03/04/20 Lonell Grandchild, MPT Physical Therapist with [facility] [phone] office [phone] mobile phone

## 2020-03-04 NOTE — Plan of Care (Signed)

## 2020-03-04 NOTE — TOC Progression Note (Signed)
Transition of Care [facility]) - Progression Note    Patient Details  Name: [name] MRN: [id number] Date of Birth: Jan 21, 1946  Transition of Care [facility] CM/SW Contact  Salome Arnt, [place] Phone Number: 03/04/2020, 8:46 AM  Clinical Narrative:  [facility] notification completed: [id number].           Expected Discharge Plan and Services                                                 Social Determinants of Health (SDOH) Interventions    Readmission Risk Interventions No flowsheet data found.

## 2020-03-04 NOTE — Evaluation (Signed)
Occupational Therapy Evaluation Patient Details Name: [name] MRN: [id number] DOB: [birth year] Today's Date: 03/04/2020    History of Present Illness Adeeb Konecny  is a 75 y.o. male, with history of " tremors of nervous system," obesity, insulin-dependent diabetes mellitus type 2, hypothyroidism, hypertension, hyperlipidemia, heart valve replaced, depression, BPH, atrial fibrillation status post ICD, presents to the ED with a chief complaint of "walking sideways."  Patient's history is not consistent with past providers.  He reports to few providers at this started 3 days ago, he reports to to other providers including myself that it started 3 weeks ago.  Patient is oriented to person place and time.  He reports that he started "shaking like a leaf."  When he describes the shaking he is talking about his upper extremities only.   Clinical Impression   Pt agreeable to OT evaluation. Pt performing ADLs independently, as well as functional mobility tasks. Pt reports tremors are back to baseline. No further OT services required at this time.     Follow Up Recommendations  No OT follow up    Equipment Recommendations  None recommended by OT       Precautions / Restrictions Precautions Precautions: None Restrictions Weight Bearing Restrictions: No      Mobility Bed Mobility Overal bed mobility: Modified Independent                  Transfers Overall transfer level: Needs assistance Equipment used: None Transfers: Sit to/from Stand;Stand Pivot Transfers Sit to Stand: Supervision Stand pivot transfers: Supervision                ADL either performed or assessed with clinical judgement   ADL Overall ADL's : Modified independent;At baseline                                       General ADL Comments: Pt performing ADLs in standing, including functional mobility. No difficulty     Vision Baseline Vision/History: No visual deficits Patient  Visual Report: No change from baseline Vision Assessment?: No apparent visual deficits            Pertinent Vitals/Pain Pain Assessment: No/denies pain     Hand Dominance Right   Extremity/Trunk Assessment Upper Extremity Assessment Upper Extremity Assessment: Overall WFL for tasks assessed   Lower Extremity Assessment Lower Extremity Assessment: Defer to PT evaluation   Cervical / Trunk Assessment Cervical / Trunk Assessment: Normal   Communication Communication Communication: No difficulties   Cognition Arousal/Alertness: Awake/alert Behavior During Therapy: WFL for tasks assessed/performed Overall Cognitive Status: Within Functional Limits for tasks assessed                                                Home Living Family/patient expects to be discharged to:: Private residence Living Arrangements: Alone   Type of Home: Mobile home Home Access: Stairs to enter     Home Layout: One level     Bathroom Shower/Tub: Teacher, early years/pre: Standard     Home Equipment: Environmental consultant - 2 wheels          Prior Functioning/Environment Level of Independence: Independent        Comments: independent in mobility, ADLs, driving  OT Problem List: Decreased activity tolerance;Impaired balance (sitting and/or standing)                       Co-evaluation PT/OT/SLP Co-Evaluation/Treatment: Yes Reason for Co-Treatment: Complexity of the patient's impairments (multi-system involvement)   OT goals addressed during session: ADL's and self-care      AM-PAC OT "6 Clicks" Daily Activity     Outcome Measure Help from another person eating meals?: None Help from another person taking care of personal grooming?: None Help from another person toileting, which includes using toliet, bedpan, or urinal?: None Help from another person bathing (including washing, rinsing, drying)?: None Help from another person to put on and taking off  regular upper body clothing?: None Help from another person to put on and taking off regular lower body clothing?: None 6 Click Score: 24   End of Session    Activity Tolerance: Patient tolerated treatment well Patient left: in chair;with call bell/phone within reach  OT Visit Diagnosis: Muscle weakness (generalized) (M62.81)                Time: 5183-3582 OT Time Calculation (min): 15 min Charges:  OT General Charges $OT Visit: 1 Visit   Guadelupe Sabin, OTR/L  [phone] 03/04/2020, 8:59 AM

## 2020-03-05 ENCOUNTER — Telehealth: Payer: Self-pay | Admitting: *Deleted

## 2020-03-05 ENCOUNTER — Telehealth: Payer: Self-pay | Admitting: Family Medicine

## 2020-03-05 DIAGNOSIS — G250 Essential tremor: Secondary | ICD-10-CM | POA: Diagnosis not present

## 2020-03-05 DIAGNOSIS — I480 Paroxysmal atrial fibrillation: Secondary | ICD-10-CM | POA: Diagnosis not present

## 2020-03-05 DIAGNOSIS — R262 Difficulty in walking, not elsewhere classified: Secondary | ICD-10-CM | POA: Diagnosis not present

## 2020-03-05 DIAGNOSIS — I10 Essential (primary) hypertension: Secondary | ICD-10-CM | POA: Diagnosis not present

## 2020-03-05 DIAGNOSIS — E785 Hyperlipidemia, unspecified: Secondary | ICD-10-CM

## 2020-03-05 LAB — GLUCOSE, CAPILLARY
Glucose-Capillary: 44 mg/dL — CL (ref 70–99)
Glucose-Capillary: 46 mg/dL — ABNORMAL LOW (ref 70–99)
Glucose-Capillary: 142 mg/dL — ABNORMAL HIGH (ref 70–99)
Glucose-Capillary: 158 mg/dL — ABNORMAL HIGH (ref 70–99)

## 2020-03-05 LAB — BASIC METABOLIC PANEL
Anion gap: 7 (ref 5–15)
BUN: 32 mg/dL — ABNORMAL HIGH (ref 8–23)
CO2: 28 mmol/L (ref 22–32)
Calcium: 8.7 mg/dL — ABNORMAL LOW (ref 8.9–10.3)
Chloride: 98 mmol/L (ref 98–111)
Creatinine, Ser: 1.64 mg/dL — ABNORMAL HIGH (ref 0.61–1.24)
GFR, Estimated: 43 mL/min — ABNORMAL LOW (ref >60)
Glucose, Bld: 48 mg/dL — ABNORMAL LOW (ref 70–99)
Potassium: 4.3 mmol/L (ref 3.5–5.1)
Sodium: 133 mmol/L — ABNORMAL LOW (ref 135–145)

## 2020-03-05 MED ORDER — DEXTROSE 50 % IV SOLN
INTRAVENOUS | Status: AC
  Administered 2020-03-05: 50 mL
  Filled 2020-03-05: qty 50

## 2020-03-05 MED ORDER — GLUCOSE 40 % PO GEL
ORAL | Status: AC
  Filled 2020-03-05: qty 1

## 2020-03-05 MED ORDER — INSULIN DETEMIR 100 UNIT/ML ~~LOC~~ SOLN
10.0000 [IU] | Freq: Two times a day (BID) | SUBCUTANEOUS | Status: DC
  Filled 2020-03-05 (×3): qty 0.1

## 2020-03-05 MED ORDER — INSULIN DETEMIR 100 UNIT/ML ~~LOC~~ SOLN
10.0000 [IU] | Freq: Every day | SUBCUTANEOUS | Status: DC
  Filled 2020-03-05: qty 0.1

## 2020-03-05 NOTE — Telephone Encounter (Signed)
Pt needs hospital fu

## 2020-03-05 NOTE — Progress Notes (Signed)
Nsg Discharge Note  Admit Date:  03/03/2020 Discharge date: 03/05/2020   [name] to be D/C'd Home per MD order.  AVS completed.  Copy for chart, and copy for patient signed, and dated. Patient/caregiver able to verbalize understanding.  Discharge Medication: Allergies as of 03/05/2020      Reactions   Phenothiazines Anaphylaxis   Actos [pioglitazone] Swelling      Medication List    STOP taking these medications   azelastine 0.1 % nasal spray Commonly known as: ASTELIN   fluticasone 50 MCG/ACT nasal spray Commonly known as: FLONASE   sulfamethoxazole-trimethoprim 800-160 MG tablet Commonly known as: BACTRIM DS   triamcinolone 0.1 % Commonly known as: KENALOG     TAKE these medications   acetaminophen 500 MG tablet Commonly known as: TYLENOL Take 1,000 mg by mouth every 6 (six) hours as needed (pain).   apixaban 5 MG Tabs tablet Commonly known as: Eliquis Take 1 tablet (5 mg total) by mouth 2 (two) times daily.   cholecalciferol 1000 units tablet Commonly known as: VITAMIN D Take 1,000 Units by mouth daily.   furosemide 20 MG tablet Commonly known as: LASIX Take 1 tablet (20 mg total) by mouth daily.   gabapentin 300 MG capsule Commonly known as: NEURONTIN Take 1 capsule (300 mg total) by mouth 2 (two) times daily.   glucose blood test strip 1 strip by Does not apply route 3 (three) times daily. Uses true metrix strips (diabetic club)   Levemir FlexTouch 100 UNIT/ML FlexPen Generic drug: insulin detemir Inject 35-40 Units into the skin 2 (two) times daily. What changed:   how much to take  Another medication with the same name was removed. Continue taking this medication, and follow the directions you see here.   levothyroxine 175 MCG tablet Commonly known as: SYNTHROID Take 1 tablet (175 mcg total) by mouth daily before breakfast.   lisinopril 5 MG tablet Commonly known as: ZESTRIL Take 5 mg by mouth daily.   MENS MULTI VITAMIN & MINERAL  PO Take 1 tablet by mouth daily.   metFORMIN 1000 MG tablet Commonly known as: GLUCOPHAGE TAKE 1 TABLET BY MOUTH TWICE DAILY WITH A MEAL. What changed: See the new instructions.   Ozempic (1 MG/DOSE) 2 MG/1.5ML Sopn Generic drug: Semaglutide (1 MG/DOSE) Inject 1 mg into the skin once a week.   simvastatin 80 MG tablet Commonly known as: ZOCOR Take 0.5 tablets (40 mg total) by mouth at bedtime.   tamsulosin 0.4 MG Caps capsule Commonly known as: FLOMAX Take 2 capsules (0.8 mg total) by mouth at bedtime.   traMADol 50 MG tablet Commonly known as: ULTRAM Take 50 mg by mouth 2 (two) times daily.       Discharge Assessment: Vitals:   03/04/20 2016 03/05/20 0557  BP: 136/68 (!) 144/59  Pulse: 82 73  Resp: 18 18  Temp: 99 F (37.2 C) 98.6 F (37 C)  SpO2: 99% 99%   Skin clean, dry and intact without evidence of skin break down, no evidence of skin tears noted. IV catheter discontinued intact. Site without signs and symptoms of complications - no redness or edema noted at insertion site, patient denies c/o pain - only slight tenderness at site.  Dressing with slight pressure applied.  D/c Instructions-Education: Discharge instructions given to patient/family with verbalized understanding. D/c education completed with patient/family including follow up instructions, medication list, d/c activities limitations if indicated, with other d/c instructions as indicated by MD - patient able to verbalize understanding,  all questions fully answered. Patient instructed to return to ED, call 911, or call MD for any changes in condition.  Patient escorted via [place], and D/C home via private auto.  Dorcas Mcmurray, LPN 0/10/9321 55:73 PM

## 2020-03-05 NOTE — Telephone Encounter (Signed)
Marland KitchenMarland KitchenMarland Kitchen.................  Contact Date: 03/05/2020 Contacted By: Faylene Million, LPN  Transition Care Management Follow-up Telephone Call  Date of discharge and from where: 03/05/20 [facility]  Discharge Diagnosis: Ataxia  How have you been since you were released from the hospital? weak  Any questions or concerns? No   Items Reviewed:  Did the pt receive and understand the discharge instructions provided? Yes   Medications obtained and verified? Yes   Any new allergies since your discharge? No   Dietary orders reviewed? Yes  Do you have support at home? No   Discontinued Medications STOP taking these medications   azelastine 0.1 % nasal spray Commonly known as: ASTELIN   fluticasone 50 MCG/ACT nasal spray Commonly known as: FLONASE   sulfamethoxazole-trimethoprim 800-160 MG tablet Commonly known as: BACTRIM DS   triamcinolone 0.1 % Commonly known as: KENALOG    New Medications Added none  Current Medication List Allergies as of 03/05/2020      Reactions   Phenothiazines Anaphylaxis   Actos [pioglitazone] Swelling      Medication List       Accurate as of March 05, 2020  3:04 PM. If you have any questions, ask your nurse or doctor.        acetaminophen 500 MG tablet Commonly known as: TYLENOL Take 1,000 mg by mouth every 6 (six) hours as needed (pain).   apixaban 5 MG Tabs tablet Commonly known as: Eliquis Take 1 tablet (5 mg total) by mouth 2 (two) times daily.   cholecalciferol 1000 units tablet Commonly known as: VITAMIN D Take 1,000 Units by mouth daily.   furosemide 20 MG tablet Commonly known as: LASIX Take 1 tablet (20 mg total) by mouth daily.   gabapentin 300 MG capsule Commonly known as: NEURONTIN Take 1 capsule (300 mg total) by mouth 2 (two) times daily.   glucose blood test strip 1 strip by Does not apply route 3 (three) times daily. Uses true metrix strips (diabetic club)   Levemir FlexTouch 100 UNIT/ML FlexPen Generic  drug: insulin detemir Inject 35-40 Units into the skin 2 (two) times daily. What changed: how much to take   levothyroxine 175 MCG tablet Commonly known as: SYNTHROID Take 1 tablet (175 mcg total) by mouth daily before breakfast.   lisinopril 5 MG tablet Commonly known as: ZESTRIL Take 5 mg by mouth daily.   MENS MULTI VITAMIN & MINERAL PO Take 1 tablet by mouth daily.   metFORMIN 1000 MG tablet Commonly known as: GLUCOPHAGE TAKE 1 TABLET BY MOUTH TWICE DAILY WITH A MEAL. What changed: See the new instructions.   Ozempic (1 MG/DOSE) 2 MG/1.5ML Sopn Generic drug: Semaglutide (1 MG/DOSE) Inject 1 mg into the skin once a week.   simvastatin 80 MG tablet Commonly known as: ZOCOR Take 0.5 tablets (40 mg total) by mouth at bedtime.   tamsulosin 0.4 MG Caps capsule Commonly known as: FLOMAX Take 2 capsules (0.8 mg total) by mouth at bedtime.   traMADol 50 MG tablet Commonly known as: ULTRAM Take 50 mg by mouth 2 (two) times daily.        Home Care and Equipment/Supplies: Were home health services ordered? no If so, what is the name of the agency?   Has the agency set up a time to come to the patient's home? not applicable Were any new equipment or medical supplies ordered?  No What is the name of the medical supply agency?  Were you able to get the supplies/equipment? not applicable Do you  have any questions related to the use of the equipment or supplies? No  Functional Questionnaire: (I = Independent and D = Dependent) ADLs: I  Bathing/Dressing- I  Meal Prep- I  Eating- I  Maintaining continence- I  Transferring/Ambulation- I  Managing Meds- I  Follow up appointments reviewed:   PCP Hospital f/u appt confirmed? Yes  Scheduled to see [place] on 03/13/20@ 12 noon  Specialist Hospital f/u appt confirmed? No    Are transportation arrangements needed? No   If their condition worsens, is the pt aware to call PCP or go to the Emergency Dept.? Yes  Was the  patient provided with contact information for the PCP's office or ED? Yes  Was to pt encouraged to call back with questions or concerns? Yes

## 2020-03-05 NOTE — Discharge Summary (Signed)
Physician Discharge Summary  CHRISTIANO BLANDON ENI:[id number] DOB: 1945-12-26 DOA: 03/03/2020  PCP: Dettinger, Fransisca Kaufmann, MD  Admit date: 03/03/2020 Discharge date: 03/05/2020  Admitted From: Home  Disposition:  Home   Recommendations for Outpatient Follow-up and new medication changes:  1. Follow up with Dr. Warrick Parisian in 7 days.  2. Follow up with Neurology as outpatient.   Home Health: no   Equipment/Devices: no    Discharge Condition: stable  CODE STATUS: full  Diet recommendation:  Heart healthy and diabetic prudent.   Brief/Interim Summary: Mr. Thayer was admitted to the hospital with the working diagnosis of ataxia.  75 year old male past medical history for obesity, type II diabetes mellitus, hypothyroidism, hypertension, dyslipidemia, depression, atrial fibrillation status post AICD, and BPH who presented with tremors.  Symptom onset for about 3 weeks, consistent with tremors, bilaterally and constant, associated with ambulatory dysfunction. On his initial physical examination temperature 99.4, heart rate 53-89, respiratory 13-18, blood pressure 137/66, neurologically patient nonfocal, positive intentional tremor, lungs clear to auscultation bilaterally, heart S1-S2, present rhythmic, soft abdomen, no lower extremity edema.  Sodium 130, potassium 5.0, chloride 92, bicarb 23, glucose 176, BUN 29, creatinine 1.89, white count 8.2, hemoglobin 14.4, hematocrit 42.9, platelets 114. SARS COVID-19 negative.  Head CT no acute changes.  EKG 76 bpm, left axis deviation, prolonged QRS, atrial-ventricular paced, lateral T wave inversions.  1. Worsening tremors and ambulatory dysfunction.  Patient was admitted for observation and further neurologic evaluation.  His symptoms rapidly resolved, he does have a baseline intentional tremors.  He was evaluated by speech therapy, physical therapy and Occupational Therapy, no follow-up recommended.  Further work-up with carotid ultrasonography  with moderate plaque at the level of the right carotid bulb and proximal right ICA, minimal plaque at the level left carotid bulb, and proximal left ICA.  Estimated bilateral ICA stenosis less than 50%. Echocardiography with ejection fraction 65 to 70% of the left ventricle.  Right ventricle systolic function normal.  His symptoms clinically improved, will refer for further outpatient follow-up.  He was unable to get brain MRI due to pacemaker.  2.  Hypertension.  Blood pressure control with lisinopril. Resume furosemide at discharge.   3.  Type II diabetes mellitus/dyslipidemia.  Patient had episode of hypoglycemia, at discharge will resume his diabetic regimen including insulin and Metformin.  Continue statin therapy, LDL 74, triglycerides 75, HDL 46.  4.  Atrial fibrillation.  Paroxysmal, continue anticoagulation with apixaban.  5.  Acute kidney injury chronic kidney disease stage IIIa/hyponatremia.  Peak creatinine 1.89, at discharge is down to 1.64, sodium corrected at 133.  Resume furosemide at discharge.   Discharge Diagnoses:  Principal Problem:   Ambulatory dysfunction Active Problems:   Hyperlipidemia with target LDL less than 100   Depression   Essential hypertension   Type 2 diabetes mellitus with diabetic neuropathy, unspecified (HCC)   Atrial fibrillation (HCC)   Essential tremor    Discharge Instructions   Allergies as of 03/05/2020      Reactions   Phenothiazines Anaphylaxis   Actos [pioglitazone] Swelling      Medication List    STOP taking these medications   azelastine 0.1 % nasal spray Commonly known as: ASTELIN   fluticasone 50 MCG/ACT nasal spray Commonly known as: FLONASE   sulfamethoxazole-trimethoprim 800-160 MG tablet Commonly known as: BACTRIM DS   triamcinolone 0.1 % Commonly known as: KENALOG     TAKE these medications   acetaminophen 500 MG tablet Commonly known as: TYLENOL Take 1,000 mg  by mouth every 6 (six) hours as needed  (pain).   apixaban 5 MG Tabs tablet Commonly known as: Eliquis Take 1 tablet (5 mg total) by mouth 2 (two) times daily.   cholecalciferol 1000 units tablet Commonly known as: VITAMIN D Take 1,000 Units by mouth daily.   furosemide 20 MG tablet Commonly known as: LASIX Take 1 tablet (20 mg total) by mouth daily.   gabapentin 300 MG capsule Commonly known as: NEURONTIN Take 1 capsule (300 mg total) by mouth 2 (two) times daily.   glucose blood test strip 1 strip by Does not apply route 3 (three) times daily. Uses true metrix strips (diabetic club)   Levemir FlexTouch 100 UNIT/ML FlexPen Generic drug: insulin detemir Inject 35-40 Units into the skin 2 (two) times daily. What changed:   how much to take  Another medication with the same name was removed. Continue taking this medication, and follow the directions you see here.   levothyroxine 175 MCG tablet Commonly known as: SYNTHROID Take 1 tablet (175 mcg total) by mouth daily before breakfast.   lisinopril 5 MG tablet Commonly known as: ZESTRIL Take 5 mg by mouth daily.   MENS MULTI VITAMIN & MINERAL PO Take 1 tablet by mouth daily.   metFORMIN 1000 MG tablet Commonly known as: GLUCOPHAGE TAKE 1 TABLET BY MOUTH TWICE DAILY WITH A MEAL. What changed: See the new instructions.   Ozempic (1 MG/DOSE) 2 MG/1.5ML Sopn Generic drug: Semaglutide (1 MG/DOSE) Inject 1 mg into the skin once a week.   simvastatin 80 MG tablet Commonly known as: ZOCOR Take 0.5 tablets (40 mg total) by mouth at bedtime.   tamsulosin 0.4 MG Caps capsule Commonly known as: FLOMAX Take 2 capsules (0.8 mg total) by mouth at bedtime.   traMADol 50 MG tablet Commonly known as: ULTRAM Take 50 mg by mouth 2 (two) times daily.       Allergies  Allergen Reactions  . Phenothiazines Anaphylaxis  . Actos [Pioglitazone] Swelling       Procedures/Studies: CT Head Wo Contrast  Result Date: 03/03/2020 CLINICAL DATA:  Neurologic deficit.   Concern for stroke. EXAM: CT HEAD WITHOUT CONTRAST TECHNIQUE: Contiguous axial images were obtained from the base of the skull through the vertex without intravenous contrast. COMPARISON:  Head CT dated 09/16/2019. FINDINGS: Brain: Mild age-related atrophy and chronic microvascular ischemic changes. There is no acute intracranial hemorrhage. No mass effect or midline shift. Stable appearing CSF space in the posterior fossa may represent dilated and arachnoid cyst or a dilated cisterna magna. Vascular: No hyperdense vessel or unexpected calcification. Skull: Normal. Negative for fracture or focal lesion. Sinuses/Orbits: Mild mucoperiosteal thickening of paranasal sinuses. Partial opacification of the right sphenoid sinus. No air-fluid level. The mastoid air cells are clear. Other: None IMPRESSION: 1. No acute intracranial pathology. 2. Mild age-related atrophy and chronic microvascular ischemic changes. Electronically Signed   By: Anner Crete M.D.   On: 03/03/2020 18:29   US Carotid Bilateral (at [facility] and [facility] only)  Result Date: 03/04/2020 CLINICAL DATA:  Ataxia, hypertension, hyperlipidemia and diabetes. EXAM: BILATERAL CAROTID DUPLEX ULTRASOUND TECHNIQUE: Pearline Cables scale imaging, color Doppler and duplex ultrasound were performed of bilateral carotid and vertebral arteries in the neck. COMPARISON:  None. FINDINGS: Criteria: Quantification of carotid stenosis is based on velocity parameters that correlate the residual internal carotid diameter with NASCET-based stenosis levels, using the diameter of the distal internal carotid lumen as the denominator for stenosis measurement. The following velocity measurements were obtained: RIGHT ICA:  93/25 cm/sec CCA:  16/01 cm/sec SYSTOLIC ICA/CCA RATIO:  1.0 ECA:  95 cm/sec LEFT ICA:  97/22 cm/sec CCA:  09/3 cm/sec SYSTOLIC ICA/CCA RATIO:  1.1 ECA:  89 cm/sec RIGHT CAROTID ARTERY: Moderate amount of partially calcified plaque at the level of the distal bulb and proximal  right ICA. Estimated right ICA stenosis is less than 50%. RIGHT VERTEBRAL ARTERY: Antegrade flow with normal waveform and velocity. LEFT CAROTID ARTERY: Minimal plaque at the level of the left carotid bulb and proximal left ICA. Estimated left ICA stenosis is less than 50%. LEFT VERTEBRAL ARTERY: Antegrade flow with normal waveform and velocity. IMPRESSION: Moderate plaque at the level of the right carotid bulb and proximal right ICA and minimal plaque at the level of the left carotid bulb and proximal left ICA. Estimated bilateral ICA stenoses are less than 50%. Electronically Signed   By: Aletta Edouard M.D.   On: 03/04/2020 11:02   ECHOCARDIOGRAM COMPLETE  Result Date: 03/04/2020    ECHOCARDIOGRAM REPORT   Patient Name:   SINAN TUCH Date of Exam: 03/04/2020 Medical Rec #:  [id number]        Height:       73.0 in Accession #:    [id number]       Weight:       242.0 lb Date of Birth:  [birth year]         BSA:          2.333 m Patient Age:    75 years         BP:           125/78 mmHg Patient Gender: M                HR:           60 bpm. Exam Location:  [facility] Procedure: 2D Echo Indications:    Stroke 434.91 / I163.9  History:        Patient has prior history of Echocardiogram examinations, most                 recent 12/07/2017. CHF, COPD, Arrythmias:Atrial Fibrillation;                 Risk Factors:Hypertension, Diabetes and Dyslipidemia. Aortic                 Valve Replacement.  Sonographer:    Leavy Cella RDCS (AE) Referring Phys: [id number] AS[place]  1. Left ventricular ejection fraction, by estimation, is 65 to 70%. The left ventricle has normal function. The left ventricle has no regional wall motion abnormalities. There is mild left ventricular hypertrophy. Left ventricular diastolic parameters are indeterminate.  2. Right ventricular systolic function is normal. The right ventricular size is normal.  3. The mitral valve is normal in structure. No evidence of mitral valve  regurgitation. No evidence of mitral stenosis.  4. The aortic valve has been repaired/replaced. Aortic valve regurgitation is not visualized. No aortic stenosis is present.  5. The inferior vena cava is normal in size with greater than 50% respiratory variability, suggesting right atrial pressure of 3 mmHg. FINDINGS  Left Ventricle: Left ventricular ejection fraction, by estimation, is 65 to 70%. The left ventricle has normal function. The left ventricle has no regional wall motion abnormalities. Definity contrast agent was given IV to delineate the left ventricular  endocardial borders. The left ventricular internal cavity size was normal in size. There is mild left ventricular hypertrophy. Left ventricular diastolic  parameters are indeterminate. Right Ventricle: The right ventricular size is normal. No increase in right ventricular wall thickness. Right ventricular systolic function is normal. Left Atrium: Left atrial size was normal in size. Right Atrium: Right atrial size was normal in size. Pericardium: There is no evidence of pericardial effusion. Mitral Valve: The mitral valve is normal in structure. There is mild thickening of the mitral valve leaflet(s). There is mild calcification of the mitral valve leaflet(s). Mild mitral annular calcification. No evidence of mitral valve regurgitation. No evidence of mitral valve stenosis. The mean mitral valve gradient is 1.9 mmHg with average heart rate of 79 bpm. Tricuspid Valve: The tricuspid valve is normal in structure. Tricuspid valve regurgitation is not demonstrated. No evidence of tricuspid stenosis. Aortic Valve: Bioprosthetic valve is in the AV position. The aortic valve has been repaired/replaced. Aortic valve regurgitation is not visualized. No aortic stenosis is present. Aortic valve mean gradient measures 14.8 mmHg. Aortic valve peak gradient measures 26.3 mmHg. Pulmonic Valve: The pulmonic valve was not well visualized. Pulmonic valve regurgitation is  not visualized. No evidence of pulmonic stenosis. Aorta: The aortic root is normal in size and structure. Pulmonary Artery: Indeterminant PASP, inadequate TR jet. Venous: The inferior vena cava is normal in size with greater than 50% respiratory variability, suggesting right atrial pressure of 3 mmHg. IAS/Shunts: No atrial level shunt detected by color flow Doppler.  LEFT VENTRICLE PLAX 2D LVIDd:         4.75 cm Diastology LVIDs:         2.77 cm LV e' lateral:   6.20 cm/s LV PW:         1.30 cm LV E/e' lateral: 14.7 LV IVS:        1.16 cm  RIGHT VENTRICLE RV S prime:     8.92 cm/s TAPSE (M-mode): 1.2 cm LEFT ATRIUM             Index       RIGHT ATRIUM           Index LA diam:        5.10 cm 2.19 cm/m  RA Area:     15.70 cm LA Vol (A2C):   68.6 ml 29.40 ml/m RA Volume:   43.30 ml  18.56 ml/m LA Vol (A4C):   58.4 ml 25.03 ml/m LA Biplane Vol: 64.5 ml 27.64 ml/m  AORTIC VALVE AV Vmax:           256.56 cm/s AV Vmean:          177.469 cm/s AV VTI:            0.472 m AV Peak Grad:      26.3 mmHg AV Mean Grad:      14.8 mmHg LVOT Vmax:         100.21 cm/s LVOT Vmean:        72.234 cm/s LVOT VTI:          0.206 m LVOT/AV VTI ratio: 0.44  AORTA Ao Root diam: 2.50 cm MITRAL VALVE MV Area (PHT): 2.96 cm    SHUNTS MV Mean grad:  1.9 mmHg    Systemic VTI: 0.21 m MV Decel Time: 256 msec MV E velocity: 91.30 cm/s MV A velocity: 83.10 cm/s MV E/A ratio:  1.10 Carlyle Dolly MD Electronically signed by Carlyle Dolly MD Signature Date/Time: 03/04/2020/4:44:53 PM    Final         Subjective: Patient is feeling back to baseline, no nausea or vomiting, no chest  pain or dyspnea,   Discharge Exam: Vitals:   03/04/20 2016 03/05/20 0557  BP: 136/68 (!) 144/59  Pulse: 82 73  Resp: 18 18  Temp: 99 F (37.2 C) 98.6 F (37 C)  SpO2: 99% 99%   Vitals:   03/04/20 0530 03/04/20 1400 03/04/20 2016 03/05/20 0557  BP: (!) 156/68 125/78 136/68 (!) 144/59  Pulse: 77 60 82 73  Resp: 16 18 18 18   Temp:  98.7 F (37.1 C)  99 F (37.2 C) 98.6 F (37 C)  TempSrc:   Oral Oral  SpO2: 99% 100% 99% 99%  Weight:      Height:        General: Not in pain or dyspnea  Neurology: Awake and alert, non focal  E ENT: no pallor, no icterus, oral mucosa moist Cardiovascular: No JVD. S1-S2 present, rhythmic, no gallops, rubs, or murmurs. No lower extremity edema. Pulmonary: positive breath sounds bilaterally, adequate air movement, no wheezing, rhonchi or rales. Gastrointestinal. Abdomen soft and non tender Skin. No rashes Musculoskeletal: no joint deformities   The results of significant diagnostics from this hospitalization (including imaging, microbiology, ancillary and laboratory) are listed below for reference.     Microbiology: Recent Results (from the past 240 hour(s))  Resp Panel by RT-PCR (Flu A&B, Covid) Nasopharyngeal Swab     Status: None   Collection Time: 03/03/20  6:20 PM   Specimen: Nasopharyngeal Swab; Nasopharyngeal(NP) swabs in vial transport medium  Result Value Ref Range Status   SARS Coronavirus 2 by RT PCR NEGATIVE NEGATIVE Final    Comment: (NOTE) SARS-CoV-2 target nucleic acids are NOT DETECTED.  The SARS-CoV-2 RNA is generally detectable in upper respiratory specimens during the acute phase of infection. The lowest concentration of SARS-CoV-2 viral copies this assay can detect is 138 copies/mL. A negative result does not preclude SARS-Cov-2 infection and should not be used as the sole basis for treatment or other patient management decisions. A negative result may occur with  improper specimen collection/handling, submission of specimen other than nasopharyngeal swab, presence of viral mutation(s) within the areas targeted by this assay, and inadequate number of viral copies(<138 copies/mL). A negative result must be combined with clinical observations, patient history, and epidemiological information. The expected result is Negative.  Fact Sheet for Patients:   EntrepreneurPulse.com.au  Fact Sheet for Healthcare Providers:  IncredibleEmployment.be  This test is no t yet approved or cleared by the [place] FDA and  has been authorized for detection and/or diagnosis of SARS-CoV-2 by FDA under an Emergency Use Authorization (EUA). This EUA will remain  in effect (meaning this test can be used) for the duration of the COVID-19 declaration under Section 564(b)(1) of the Act, 21 U.S.C.section 360bbb-3(b)(1), unless the authorization is terminated  or revoked sooner.       Influenza A by PCR NEGATIVE NEGATIVE Final   Influenza B by PCR NEGATIVE NEGATIVE Final    Comment: (NOTE) The Xpert Xpress SARS-CoV-2/FLU/RSV plus assay is intended as an aid in the diagnosis of influenza from Nasopharyngeal swab specimens and should not be used as a sole basis for treatment. Nasal washings and aspirates are unacceptable for Xpert Xpress SARS-CoV-2/FLU/RSV testing.  Fact Sheet for Patients: EntrepreneurPulse.com.au  Fact Sheet for Healthcare Providers: IncredibleEmployment.be  This test is not yet approved or cleared by the [place] FDA and has been authorized for detection and/or diagnosis of SARS-CoV-2 by FDA under an Emergency Use Authorization (EUA). This EUA will remain in effect (meaning this test can be  used) for the duration of the COVID-19 declaration under Section 564(b)(1) of the Act, 21 U.S.C. section 360bbb-3(b)(1), unless the authorization is terminated or revoked.  Performed at [facility], [address], [place], [place] [address]      Labs: BNP (last 3 results) No results for input(s): BNP in the last 8760 hours. Basic Metabolic Panel: Recent Labs  Lab 02/27/20 0919 03/03/20 1514 03/03/20 2222 03/05/20 0545  NA 126* 130*  --  133*  K 5.4* 5.0  --  4.3  CL 91* 92*  --  98  CO2 20 23  --  28  GLUCOSE 230* 176*  --  48*  BUN 23 29*  --  32*   CREATININE 1.62* 1.89*  --  1.64*  CALCIUM 8.9 9.1  --  8.7*  MG  --   --  2.3  --    Liver Function Tests: Recent Labs  Lab 02/27/20 0919  AST 25  ALT 22  ALKPHOS 75  BILITOT 0.4  PROT 6.6  ALBUMIN 3.9   No results for input(s): LIPASE, AMYLASE in the last 168 hours. No results for input(s): AMMONIA in the last 168 hours. CBC: Recent Labs  Lab 02/27/20 0919 03/03/20 1514  WBC 7.6 8.2  NEUTROABS 5.4  --   HGB 13.5 14.4  HCT 39.5 42.9  MCV 99* 99.5  PLT 121* 114*   Cardiac Enzymes: No results for input(s): CKTOTAL, CKMB, CKMBINDEX, TROPONINI in the last 168 hours. BNP: Invalid input(s): POCBNP CBG: Recent Labs  Lab 03/04/20 1609 03/04/20 2127 03/05/20 0734 03/05/20 0736 03/05/20 0808  GLUCAP 176* 121* 38* 44* 46*   D-Dimer No results for input(s): DDIMER in the last 72 hours. Hgb A1c No results for input(s): HGBA1C in the last 72 hours. Lipid Profile Recent Labs    03/04/20 0509  CHOL 135  HDL 46  LDLCALC 74  TRIG 75  CHOLHDL 2.9   Thyroid function studies Recent Labs    03/03/20 2222  TSH 1.557   Anemia work up No results for input(s): VITAMINB12, FOLATE, FERRITIN, TIBC, IRON, RETICCTPCT in the last 72 hours. Urinalysis    Component Value Date/Time   COLORURINE YELLOW 12/10/2018 0927   APPEARANCEUR Clear 02/27/2020 0922   LABSPEC 1.009 12/10/2018 0927   PHURINE 6.0 12/10/2018 0927   GLUCOSEU Negative 02/27/2020 0922   HGBUR SMALL (A) 12/10/2018 0927   BILIRUBINUR Negative 02/27/2020 0922   KETONESUR negative 08/22/2019 1059   KETONESUR NEGATIVE 12/10/2018 0927   PROTEINUR Negative 02/27/2020 0922   PROTEINUR NEGATIVE 12/10/2018 0927   UROBILINOGEN 0.2 08/22/2019 1059   UROBILINOGEN 0.2 04/06/2014 1410   NITRITE Negative 02/27/2020 0922   NITRITE NEGATIVE 12/10/2018 0927   LEUKOCYTESUR 3+ (A) 02/27/2020 0922   LEUKOCYTESUR LARGE (A) 12/10/2018 0927   Sepsis Labs Invalid input(s): PROCALCITONIN,  WBC,   LACTICIDVEN Microbiology Recent Results (from the past 240 hour(s))  Resp Panel by RT-PCR (Flu A&B, Covid) Nasopharyngeal Swab     Status: None   Collection Time: 03/03/20  6:20 PM   Specimen: Nasopharyngeal Swab; Nasopharyngeal(NP) swabs in vial transport medium  Result Value Ref Range Status   SARS Coronavirus 2 by RT PCR NEGATIVE NEGATIVE Final    Comment: (NOTE) SARS-CoV-2 target nucleic acids are NOT DETECTED.  The SARS-CoV-2 RNA is generally detectable in upper respiratory specimens during the acute phase of infection. The lowest concentration of SARS-CoV-2 viral copies this assay can detect is 138 copies/mL. A negative result does not preclude SARS-Cov-2 infection and should  not be used as the sole basis for treatment or other patient management decisions. A negative result may occur with  improper specimen collection/handling, submission of specimen other than nasopharyngeal swab, presence of viral mutation(s) within the areas targeted by this assay, and inadequate number of viral copies(<138 copies/mL). A negative result must be combined with clinical observations, patient history, and epidemiological information. The expected result is Negative.  Fact Sheet for Patients:  EntrepreneurPulse.com.au  Fact Sheet for Healthcare Providers:  IncredibleEmployment.be  This test is no t yet approved or cleared by the [place] FDA and  has been authorized for detection and/or diagnosis of SARS-CoV-2 by FDA under an Emergency Use Authorization (EUA). This EUA will remain  in effect (meaning this test can be used) for the duration of the COVID-19 declaration under Section 564(b)(1) of the Act, 21 U.S.C.section 360bbb-3(b)(1), unless the authorization is terminated  or revoked sooner.       Influenza A by PCR NEGATIVE NEGATIVE Final   Influenza B by PCR NEGATIVE NEGATIVE Final    Comment: (NOTE) The Xpert Xpress SARS-CoV-2/FLU/RSV plus  assay is intended as an aid in the diagnosis of influenza from Nasopharyngeal swab specimens and should not be used as a sole basis for treatment. Nasal washings and aspirates are unacceptable for Xpert Xpress SARS-CoV-2/FLU/RSV testing.  Fact Sheet for Patients: EntrepreneurPulse.com.au  Fact Sheet for Healthcare Providers: IncredibleEmployment.be  This test is not yet approved or cleared by the [place] FDA and has been authorized for detection and/or diagnosis of SARS-CoV-2 by FDA under an Emergency Use Authorization (EUA). This EUA will remain in effect (meaning this test can be used) for the duration of the COVID-19 declaration under Section 564(b)(1) of the Act, 21 U.S.C. section 360bbb-3(b)(1), unless the authorization is terminated or revoked.  Performed at [facility], [address] [place], [place] [address]      Time coordinating discharge: 45 minutes  SIGNED:   Tawni Millers, MD  [facility] 03/05/2020, 8:14 AM

## 2020-03-06 NOTE — Telephone Encounter (Signed)
Patient already has appointment on 03/13/20 with Je for a TOC visit

## 2020-03-09 LAB — GLUCOSE, CAPILLARY: Glucose-Capillary: 38 mg/dL — CL (ref 70–99)

## 2020-03-13 ENCOUNTER — Other Ambulatory Visit: Payer: Self-pay

## 2020-03-13 ENCOUNTER — Encounter: Payer: Self-pay | Admitting: Nurse Practitioner

## 2020-03-13 ENCOUNTER — Ambulatory Visit (INDEPENDENT_AMBULATORY_CARE_PROVIDER_SITE_OTHER): Payer: Medicare Other | Admitting: Nurse Practitioner

## 2020-03-13 VITALS — BP 112/66 | HR 67 | Temp 97.6°F | Ht 73.0 in | Wt 244.4 lb

## 2020-03-13 DIAGNOSIS — Z09 Encounter for follow-up examination after completed treatment for conditions other than malignant neoplasm: Secondary | ICD-10-CM | POA: Diagnosis not present

## 2020-03-13 DIAGNOSIS — R270 Ataxia, unspecified: Secondary | ICD-10-CM

## 2020-03-13 NOTE — Assessment & Plan Note (Signed)
Patient was discharged from [facility] 03/05/2020 after an episode of ataxia.  Patient is stable mild tremors noted.  Patient reports mild weakness and loss of appetite.  Encourage patient to increase hydration and nutrition gradually.  Follow-up with worsening or unresolved symptoms.  Completed hospital discharge instructions patient verbalized understanding. Advised patient to follow-up with neurology appointment.

## 2020-03-13 NOTE — Assessment & Plan Note (Signed)
Symptoms gradually improving.  No signs of ataxia today, mild tremors noted.  Advised patient to continue medication as prescribed on hospital discharge instructions. Follow-up with worsening or unresolved symptoms.  Printed handouts provided to patient

## 2020-03-13 NOTE — Progress Notes (Signed)
Established Patient Office Visit  Subjective:  Patient ID: [name], male    DOB: 1945/05/12  Age: 75 y.o. MRN: [id number]  CC:  Chief Complaint  Patient presents with  . Transitions Of Care  . Gait Problem    HPI [name] is a 75 year old male with a past medical history of type 2 diabetes, hypertension, dyslipidemia, hypothyroidism, depression, arterial fibrillation, status post AICD, and benign prostate hyperplasia.  Patient presented to the emergency department with tremors, with symptoms onset 3 weeks.  Bilateral and constant tremors associated with ambulation.  Patient had a low-grade fever with blood pressures and heart rate within patient's normal limits.  COVID-19 was negative and head CT showed no acute changes.  Patient was evaluated by physical therapy, occupational therapy with no follow-ups recommended.  Patient was stabilized and discharged back home, continue statin therapy, and anticoagulation therapy. Patient is gradually regaining strength.  Reports mild weakness and loss of appetite since hospital discharge.  Today's visit was for Transitional Care Management.  The patient was discharged from [facility] on 03/05/2020 with a primary diagnosis of ataxia  Contact with the patient and/or caregiver, by a clinical staff member, was made on 03/05/2020 and was documented as a telephone encounter within the EMR.  Through chart review and discussion with the patient I have determined that management of their condition is of moderate complexity.       Past Medical History:  Diagnosis Date  . Arthritis   . Atrial fibrillation ([place])   . BPH (benign prostatic hypertrophy) 04/15/2010  . Cataract   . Colon polyps   . DEPRESSION 01/22/2009  . Heart valve replaced   . HYPERCHOLESTEROLEMIA 01/22/2009  . HYPERTENSION 01/22/2009   Dr. [name]  . 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 Morbid obesity ([place])   . Tremors of nervous system     ?ptsd  . Ulcer     Past Surgical History:  Procedure Laterality Date  . 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 APPENDECTOMY    . bilateral cataract surg    . COLONOSCOPY  04/24/2007   [name]: normal  . COLONOSCOPY WITH ESOPHAGOGASTRODUODENOSCOPY (EGD) N/A 04/05/2012   Procedure: COLONOSCOPY WITH ESOPHAGOGASTRODUODENOSCOPY (EGD);  Surgeon: Daneil Dolin, MD;  Location: [facility];  Service: Endoscopy;  Laterality: N/A;  10;15  . DENTAL SURGERY  05/2004   Dental extractions  . EYE SURGERY Bilateral    cataract  . HEEL SPUR SURGERY Bilateral    resection of heel spur  . KNEE ARTHROSCOPY WITH LATERAL MENISECTOMY Right 04/04/2014   Procedure: KNEE ARTHROSCOPY WITH LATERAL MENISECTOMY;  Surgeon: Carole Civil, MD;  Location: [facility] ORS;  Service: Orthopedics;  Laterality: Right;  . PACEMAKER IMPLANT N/A 06/13/2016   Procedure: Pacemaker Implant- Dual Chamber;  Surgeon: Evans Lance, MD;  Location: [facility];  Service: Cardiovascular;  Laterality: N/A;  . PACEMAKER INSERTION  2018  . THYROIDECTOMY  03/21/2013   DR [name]  . THYROIDECTOMY N/A 03/21/2013   Procedure: THYROIDECTOMY;  Surgeon: [name], MD;  Location: [facility];  Service: General;  Laterality: N/A;  . TONSILLECTOMY      Family History  Problem Relation Age of Onset  . Heart disease Father   . Congestive Heart Failure Father   . Arthritis Father   . Diabetes Other   . Benign prostatic hyperplasia Brother     Social History   Socioeconomic History  .  Marital status: Single    Spouse name: Not on file  . Number of children: 1  . Years of education: HS  . Highest education level: Not on file  Occupational History  . Occupation: Administrator, retired    Fish farm manager: RETIRED  Tobacco Use  . Smoking status: Former Smoker    Packs/day: 0.00    Years: 12.00    Pack years: 0.00    Types: Cigarettes    Quit date: 09/20/1961    Years since quitting: 58.5  .  Smokeless tobacco: Never Used  Vaping Use  . Vaping Use: Never used  Substance and Sexual Activity  . Alcohol use: Not Currently  . Drug use: No  . Sexual activity: Never    Comment: Has not smoked in 20 years  Other Topics Concern  . Not on file  Social History Narrative   Lives alone.   Quit smoking approximately 28 years ago.   Long haul truck driver, lives in [place].   Social Determinants of Health   Financial Resource Strain: Not on file  Food Insecurity: Not on file  Transportation Needs: Not on file  Physical Activity: Not on file  Stress: Not on file  Social Connections: Not on file  Intimate Partner Violence: Not on file    Outpatient Medications Prior to Visit  Medication Sig Dispense Refill  . acetaminophen (TYLENOL) 500 MG tablet Take 1,000 mg by mouth every 6 (six) hours as needed (pain).    Marland Kitchen apixaban (ELIQUIS) 5 MG TABS tablet Take 1 tablet (5 mg total) by mouth 2 (two) times daily. 60 tablet 3  . cholecalciferol (VITAMIN D) 1000 units tablet Take 1,000 Units by mouth daily.     . furosemide (LASIX) 20 MG tablet Take 1 tablet (20 mg total) by mouth daily. 30 tablet 1  . gabapentin (NEURONTIN) 300 MG capsule Take 1 capsule (300 mg total) by mouth 2 (two) times daily. 180 capsule 3  . glucose blood test strip 1 strip by Does not apply route 3 (three) times daily. Uses true metrix strips (diabetic club)  12  . insulin detemir (LEVEMIR FLEXTOUCH) 100 UNIT/ML FlexPen Inject 35-40 Units into the skin 2 (two) times daily. (Patient taking differently: Inject 30 Units into the skin 2 (two) times daily.) 25 mL 11  . levothyroxine (SYNTHROID) 175 MCG tablet Take 1 tablet (175 mcg total) by mouth daily before breakfast. 90 tablet 3  . lisinopril (ZESTRIL) 5 MG tablet Take 5 mg by mouth daily.    . metFORMIN (GLUCOPHAGE) 1000 MG tablet TAKE 1 TABLET BY MOUTH TWICE DAILY WITH A MEAL. (Patient taking differently: Take 1,000 mg by mouth in the morning and at bedtime.) 180  tablet 0  . Multiple Vitamins-Minerals (MENS MULTI VITAMIN & MINERAL PO) Take 1 tablet by mouth daily.      . Semaglutide, 1 MG/DOSE, (OZEMPIC, 1 MG/DOSE,) 2 MG/1.5ML SOPN Inject 1 mg into the skin once a week. 4.5 mL 3  . simvastatin (ZOCOR) 80 MG tablet Take 0.5 tablets (40 mg total) by mouth at bedtime. 15 tablet 0  . tamsulosin (FLOMAX) 0.4 MG CAPS capsule Take 2 capsules (0.8 mg total) by mouth at bedtime. 180 capsule 0  . traMADol (ULTRAM) 50 MG tablet Take 50 mg by mouth 2 (two) times daily.     No facility-administered medications prior to visit.    Allergies  Allergen Reactions  . Phenothiazines Anaphylaxis  . Actos [Pioglitazone] Swelling    ROS Review of Systems  Constitutional:  Negative.   HENT: Negative.   Respiratory: Negative.   Gastrointestinal: Negative.   Genitourinary: Negative.   Musculoskeletal: Negative.   Skin: Negative.   Neurological: Positive for weakness.       Tremors  Psychiatric/Behavioral: Negative.   All other systems reviewed and are negative.     Objective:    Physical Exam Vitals reviewed.  Constitutional:      Appearance: Normal appearance.  HENT:     Head: Normocephalic.     Nose: Nose normal.  Eyes:     Conjunctiva/sclera: Conjunctivae normal.  Cardiovascular:     Rate and Rhythm: Normal rate and regular rhythm.     Pulses: Normal pulses.     Heart sounds: Normal heart sounds.  Pulmonary:     Effort: Pulmonary effort is normal.     Breath sounds: Normal breath sounds.  Abdominal:     General: Bowel sounds are normal.  Musculoskeletal:        General: Normal range of motion.  Skin:    General: Skin is warm.  Neurological:     Mental Status: He is alert and oriented to person, place, and time.     Comments: Ataxia  Psychiatric:        Behavior: Behavior normal.     BP 112/66   Pulse 67   Temp 97.6 F (36.4 C)   Ht 6\' 1"  (1.854 m)   Wt 244 lb 6.4 oz (110.9 kg)   SpO2 98%   BMI 32.24 kg/m  Wt Readings from Last  3 Encounters:  03/13/20 244 lb 6.4 oz (110.9 kg)  03/03/20 242 lb (109.8 kg)  02/25/20 257 lb (116.6 kg)     There are no preventive care reminders to display for this patient.  There are no preventive care reminders to display for this patient.  Lab Results  Component Value Date   TSH 1.557 03/03/2020   Lab Results  Component Value Date   WBC 8.2 03/03/2020   HGB 14.4 03/03/2020   HCT 42.9 03/03/2020   MCV 99.5 03/03/2020   PLT 114 (L) 03/03/2020   Lab Results  Component Value Date   NA 133 (L) 03/05/2020   K 4.3 03/05/2020   CO2 28 03/05/2020   GLUCOSE 48 (L) 03/05/2020   BUN 32 (H) 03/05/2020   CREATININE 1.64 (H) 03/05/2020   BILITOT 0.4 02/27/2020   ALKPHOS 75 02/27/2020   AST 25 02/27/2020   ALT 22 02/27/2020   PROT 6.6 02/27/2020   ALBUMIN 3.9 02/27/2020   CALCIUM 8.7 (L) 03/05/2020   ANIONGAP 7 03/05/2020   Lab Results  Component Value Date   CHOL 135 03/04/2020   Lab Results  Component Value Date   HDL 46 03/04/2020   Lab Results  Component Value Date   LDLCALC 74 03/04/2020   Lab Results  Component Value Date   TRIG 75 03/04/2020   Lab Results  Component Value Date   CHOLHDL 2.9 03/04/2020   Lab Results  Component Value Date   HGBA1C 8.1 (H) 02/17/2020      Assessment & Plan:   Problem List Items Addressed This Visit      Other   Ataxia - Primary    Symptoms gradually improving.  No signs of ataxia today, mild tremors noted.  Advised patient to continue medication as prescribed on hospital discharge instructions. Follow-up with worsening or unresolved symptoms.  Printed handouts provided to patient      Hospital discharge follow-up    Patient was  discharged from [facility] 03/05/2020 after an episode of ataxia.  Patient is stable mild tremors noted.  Patient reports mild weakness and loss of appetite.  Encourage patient to increase hydration and nutrition gradually.  Follow-up with worsening or unresolved symptoms.   Completed hospital discharge instructions patient verbalized understanding. Advised patient to follow-up with neurology appointment.          No orders of the defined types were placed in this encounter.   Follow-up: Return if symptoms worsen or fail to improve.    Ivy Lynn, NP

## 2020-03-13 NOTE — Patient Instructions (Signed)
Ataxia  Ataxia is a condition that causes unsteadiness when walking and standing, poor coordination of body movements, and difficulty keeping a straight (upright) posture. It occurs because of a problem with the part of the brain that controls coordination and stability (cerebellum). Ataxia can develop later in life (acquired ataxia), during your 20s or 30s or even into your 60s or later. This type of ataxia develops when another medical condition, such as a stroke, damages the cerebellum. Ataxia also may be present early in life (non-acquired ataxia). There are two main types of non-acquired ataxia:  Congenital. This type is present at birth.  Hereditary. This type is passed from parent to child. The most common form of hereditary non-acquired ataxia is Friedreich ataxia. What are the causes? Acquired ataxia may be caused by:  Changes in the nervous system (neurodegenerative changes).  Changes throughout the body (systemic disorders).  A lot of exposure to: ? Certain medicines such as phenytoin and lithium. ? Solvents. These are cleaning fluids such as paint thinner, nail polish remover, carpet cleaner, and degreasers.  Alcohol abuse (alcoholism).  Medical conditions, such as: ? Celiac disease. ? Hypothyroidism. ? A lack (deficiency) of vitamin E, vitamin B12, or thiamine. ? Brain tumors. ? Multiple sclerosis. ? Cerebral palsy. ? Stroke. ? Paraneoplastic syndromes. ? Viral infections. ? Head injury. ? Malnutrition. Congenital and hereditary ataxia are caused by problems that are present in genes before birth. What are the signs or symptoms? Signs and symptoms of ataxia vary depending on the cause. They may include:  Being unsteady.  Walking with the legs wide apart (wide stance) to keep one's balance.  Uncontrolled shaking (tremor).  Poorly coordinated body movements.  Difficulty maintaining an upright posture.  Fatigue.  Changes in speech.  Changes in  vision.  Involuntary eye movements (nystagmus).  Difficulty swallowing.  Difficulty writing.  Muscle tightening that you cannot control (muscle spasms). How is this diagnosed? Ataxia may be diagnosed based on:  Your personal and family medical history.  A physical exam.  Imaging tests, such as a CT scan or MRI.  Spinal tap (lumbar puncture). This procedure involves using a needle to take a sample of the fluid around your brain and spinal cord.  Genetic testing. How is this treated? The underlying condition that causes your ataxia needs to be treated. If the cause is a brain tumor, you may need surgery. Treatment also focuses on helping you live with ataxia and improving your quality of life (supportive treatments). This may involve:  Learning ways to improve coordination and move around more carefully (physical therapy).  Learning ways to improve your ability to do daily tasks, such as bathing and feeding yourself (occupational therapy).  Using devices to help you move around, eat, or communicate (assistive devices), such as a walker, modified eating utensils, and communication aids.  Learning ways to improve speech and swallowing (speech therapy). Follow these instructions at home: Preventing falls  Lie down right away if you become very unsteady, dizzy, or nauseous, or if you feel like you are going to faint. Do not get up until all of those feelings pass.  Keep your home well-lit. Use night-lights as needed.  Remove tripping hazards, such as rugs, cords, and clutter.  Install grab bars by the toilet and in the tub and shower.  Use assistive devices such as a cane, walker, or wheelchair as needed to keep your balance. General instructions  Do not drink alcohol.  Ask your health care provider what activities are safe  for you, and what activities you should avoid.  Take over-the-counter and prescription medicines only as told by your health care provider. Get help  right away if you:  Have unsteadiness that suddenly worsens.  Have any of these: ? Severe headaches. ? Chest pain. ? Abdominal pain. ? Weakness or numbness on one side of your body. ? Vision problems. ? Difficulty speaking. ? An irregular heartbeat. ? A very fast pulse.  Feel confused. Summary  Ataxia is a condition that causes unsteadiness when walking and standing, poor coordination of body movements, and difficulty keeping a straight (upright) posture.  Ataxia occurs because of a problem with the part of the brain that controls coordination and stability (cerebellum).  The underlying condition that causes your ataxia needs to be treated. Treatment also focuses on helping you live with ataxia and improving your quality of life (supportive treatments).  Lie down right away if you become very unsteady, dizzy, or nauseous, or if you feel like you are going to faint. This information is not intended to replace advice given to you by your health care provider. Make sure you discuss any questions you have with your health care provider. Document Revised: 10/23/2019 Document Reviewed: 11/11/2016 Elsevier Patient Education  2021 [employer].

## 2020-03-14 ENCOUNTER — Other Ambulatory Visit: Payer: Self-pay | Admitting: Family Medicine

## 2020-03-17 ENCOUNTER — Telehealth: Payer: Self-pay | Admitting: Family Medicine

## 2020-03-17 NOTE — Telephone Encounter (Signed)
Please have patient set up an appointment with PCP to be reevaluated for needs and referral to neurology.

## 2020-03-17 NOTE — Telephone Encounter (Signed)
REFERRAL REQUEST Telephone Note  Have you been seen at our office for this problem? Yes TOC on 03/13/2020 (Advise that they may need an appointment with their PCP before a referral can be done)  Reason for Referral: Tremors Referral discussed with patient: yes Best contact number of patient for referral team:    Has patient been seen by a specialist for this issue before:  Patient provider preference for referral: Dr. Maureen Chatters Patient location preference for referral: [place]    Patient notified that referrals can take up to a week or longer to process. If they haven't heard anything within a week they should call back and speak with the referral department.

## 2020-03-18 ENCOUNTER — Ambulatory Visit (HOSPITAL_COMMUNITY)
Admission: RE | Admit: 2020-03-18 | Discharge: 2020-03-18 | Disposition: A | Discharge status: Home / Self Care | Payer: Medicare Other | Source: Ambulatory Visit | Attending: Sports Medicine | Admitting: Sports Medicine

## 2020-03-18 ENCOUNTER — Other Ambulatory Visit: Payer: Self-pay

## 2020-03-18 DIAGNOSIS — M4800 Spinal stenosis, site unspecified: Secondary | ICD-10-CM | POA: Diagnosis not present

## 2020-03-18 DIAGNOSIS — M1990 Unspecified osteoarthritis, unspecified site: Secondary | ICD-10-CM | POA: Diagnosis not present

## 2020-03-18 DIAGNOSIS — M48061 Spinal stenosis, lumbar region without neurogenic claudication: Secondary | ICD-10-CM | POA: Diagnosis not present

## 2020-03-18 DIAGNOSIS — M5126 Other intervertebral disc displacement, lumbar region: Secondary | ICD-10-CM | POA: Diagnosis not present

## 2020-03-18 DIAGNOSIS — M47816 Spondylosis without myelopathy or radiculopathy, lumbar region: Secondary | ICD-10-CM | POA: Diagnosis not present

## 2020-03-18 NOTE — Telephone Encounter (Signed)
Appointment scheduled.

## 2020-03-19 ENCOUNTER — Ambulatory Visit (INDEPENDENT_AMBULATORY_CARE_PROVIDER_SITE_OTHER): Payer: Medicare Other | Admitting: Family Medicine

## 2020-03-19 ENCOUNTER — Encounter: Payer: Self-pay | Admitting: Family Medicine

## 2020-03-19 VITALS — BP 150/70 | HR 103 | Ht 73.0 in | Wt 243.0 lb

## 2020-03-19 DIAGNOSIS — R29898 Other symptoms and signs involving the musculoskeletal system: Secondary | ICD-10-CM | POA: Diagnosis not present

## 2020-03-19 DIAGNOSIS — R2689 Other abnormalities of gait and mobility: Secondary | ICD-10-CM | POA: Diagnosis not present

## 2020-03-19 DIAGNOSIS — R262 Difficulty in walking, not elsewhere classified: Secondary | ICD-10-CM | POA: Diagnosis not present

## 2020-03-19 DIAGNOSIS — R197 Diarrhea, unspecified: Secondary | ICD-10-CM

## 2020-03-19 DIAGNOSIS — R251 Tremor, unspecified: Secondary | ICD-10-CM

## 2020-03-19 MED ORDER — LOPERAMIDE HCL 2 MG PO TABS: 2.0000 mg | ORAL_TABLET | Freq: Four times a day (QID) | ORAL | 0 refills | Status: DC | PRN

## 2020-03-19 NOTE — Progress Notes (Signed)
° °BP (!) 150/70    Pulse (!) 103    Ht 6' 1" (1.854 m)    Wt 243 lb (110.2 kg)    SpO2 99%    BMI 32.06 kg/m²   ° °Subjective:  ° °Patient ID: [name], male    DOB: 02/28/1945, 74 y.o.   MRN: [id number] ° °HPI: °[name] is a 74 y.o. male presenting on 03/19/2020 for Medical Management of Chronic Issues (Requesting referral to neurology) ° ° °HPI °Patient is coming in today with complaints of increasing tremors and weakness in bilateral lower extremities and balance issues and difficulty walking that is all been increasing over the past month.  He was seen in the emergency department and had a CT scan of his head and blood work and urinalysis with cultures and lumbar MRI which showed a mild arthritis but no major changes and otherwise everything else was normal.  He continues to have these issues and tremors and they have all been worsening.  He would like to go see neurology. ° °Patient does admit to having diarrhea and would like something to help, would like to try Imodium.  The diarrhea is chronic off and on ° °Relevant past medical, surgical, family and social history reviewed and updated as indicated. Interim medical history since our last visit reviewed. °Allergies and medications reviewed and updated. ° °Review of Systems  °Constitutional: Negative for chills and fever.  °Eyes: Negative for visual disturbance.  °Respiratory: Negative for shortness of breath and wheezing.   °Cardiovascular: Negative for chest pain and leg swelling.  °Gastrointestinal: Negative for abdominal pain and diarrhea.  °Musculoskeletal: Positive for gait problem.  °Skin: Negative for rash.  °Neurological: Positive for tremors and weakness. Negative for dizziness and numbness.  °All other systems reviewed and are negative. ° ° °Per HPI unless specifically indicated above ° ° °Allergies as of 03/19/2020   °   Reactions  ° Phenothiazines Anaphylaxis  ° Actos [pioglitazone] Swelling  °  °  °Medication List  °  °  ° Accurate  as of March 19, 2020 11:10 AM. If you have any questions, ask your nurse or doctor.  °  °  °  °acetaminophen 500 MG tablet °Commonly known as: TYLENOL °Take 1,000 mg by mouth every 6 (six) hours as needed (pain). °  °apixaban 5 MG Tabs tablet °Commonly known as: Eliquis °Take 1 tablet (5 mg total) by mouth 2 (two) times daily. °  °cholecalciferol 1000 units tablet °Commonly known as: VITAMIN D °Take 1,000 Units by mouth daily. °  °furosemide 20 MG tablet °Commonly known as: LASIX °Take 1 tablet (20 mg total) by mouth daily. °  °gabapentin 300 MG capsule °Commonly known as: NEURONTIN °Take 1 capsule (300 mg total) by mouth 2 (two) times daily. °  °glucose blood test strip °1 strip by Does not apply route 3 (three) times daily. Uses true metrix strips (diabetic club) °  °Levemir FlexTouch 100 UNIT/ML FlexPen °Generic drug: insulin detemir °Inject 35-40 Units into the skin 2 (two) times daily. °What changed: how much to take °  °levothyroxine 175 MCG tablet °Commonly known as: SYNTHROID °Take 1 tablet (175 mcg total) by mouth daily before breakfast. °  °lisinopril 5 MG tablet °Commonly known as: ZESTRIL °Take 5 mg by mouth daily. °  °MENS MULTI VITAMIN & MINERAL PO °Take 1 tablet by mouth daily. °  °metFORMIN 1000 MG tablet °Commonly known as: GLUCOPHAGE °TAKE 1 TABLET BY MOUTH TWICE DAILY WITH A MEAL. °  What changed: when to take this °  °Ozempic (1 MG/DOSE) 2 MG/1.5ML Sopn °Generic drug: Semaglutide (1 MG/DOSE) °Inject 1 mg into the skin once a week. °  °simvastatin 80 MG tablet °Commonly known as: ZOCOR °Take 0.5 tablets (40 mg total) by mouth at bedtime. °  °tamsulosin 0.4 MG Caps capsule °Commonly known as: FLOMAX °TAKE TWO CAPSULES BY MOUTH AT BEDTIME °  °traMADol 50 MG tablet °Commonly known as: ULTRAM °Take 50 mg by mouth 2 (two) times daily. °  °  ° ° ° °Objective:  ° °BP (!) 150/70    Pulse (!) 103    Ht 6' 1" (1.854 m)    Wt 243 lb (110.2 kg)    SpO2 99%    BMI 32.06 kg/m²   °Wt Readings from Last 3  Encounters:  °03/19/20 243 lb (110.2 kg)  °03/13/20 244 lb 6.4 oz (110.9 kg)  °03/03/20 242 lb (109.8 kg)  °  °Physical Exam °Vitals and nursing note reviewed.  °Constitutional:   °   General: He is not in acute distress. °   Appearance: He is well-developed and well-nourished. He is not diaphoretic.  °Eyes:  °   General: No scleral icterus. °   Extraocular Movements: EOM normal.  °   Conjunctiva/sclera: Conjunctivae normal.  °Neck:  °   Thyroid: No thyromegaly.  °Cardiovascular:  °   Rate and Rhythm: Normal rate and regular rhythm.  °   Pulses: Intact distal pulses.  °   Heart sounds: Normal heart sounds. No murmur heard. ° ° °Pulmonary:  °   Effort: Pulmonary effort is normal. No respiratory distress.  °   Breath sounds: Normal breath sounds. No wheezing.  °Abdominal:  °   General: Abdomen is flat. Bowel sounds are normal. There is no distension.  °   Tenderness: There is no abdominal tenderness. There is no guarding or rebound.  °Musculoskeletal:     °   General: No edema. Normal range of motion.  °   Cervical back: Neck supple.  °Lymphadenopathy:  °   Cervical: No cervical adenopathy.  °Skin: °   General: Skin is warm and dry.  °   Findings: No rash.  °Neurological:  °   General: No focal deficit present.  °   Mental Status: He is alert and oriented to person, place, and time.  °   Cranial Nerves: No cranial nerve deficit.  °   Sensory: No sensory deficit.  °   Motor: Weakness (4/5 strength in all 4 extremities) and tremor (Fine tremors at rest in both hands) present.  °   Gait: Gait abnormal.  °   Deep Tendon Reflexes: Reflexes normal.  °Psychiatric:     °   Mood and Affect: Mood and affect normal.     °   Behavior: Behavior normal.  ° ° ° ° ° °Assessment & Plan:  ° °Problem List Items Addressed This Visit   °  ° Other  ° Ambulatory dysfunction  ° Relevant Orders  ° Ambulatory referral to Neurology  ° CBC with Differential/Platelet  ° CMP14+EGFR  ° Ammonia  °  °Other Visit Diagnoses   ° Diarrhea, unspecified  type    -  Primary  ° Relevant Medications  ° loperamide (IMODIUM A-D) 2 MG tablet  ° Leg weakness, bilateral      ° Relevant Orders  ° Ambulatory referral to Neurology  ° CBC with Differential/Platelet  ° CMP14+EGFR  ° Ammonia  ° Tremor      °   Relevant Orders  ° Ambulatory referral to Neurology  ° CBC with Differential/Platelet  ° CMP14+EGFR  ° Ammonia  ° Balance problem      ° Relevant Orders  ° Ambulatory referral to Neurology  ° CBC with Differential/Platelet  ° CMP14+EGFR  ° Ammonia  °  °  °Will redo referral to neurology, mri showed no major back abnormalities °Follow up plan: °Return if symptoms worsen or fail to improve. ° °Counseling provided for all of the vaccine components °No orders of the defined types were placed in this encounter. ° ° ° , MD °[facility] °03/19/2020, 11:10 AM ° ° ° ° °

## 2020-03-20 LAB — CMP14+EGFR
ALT: 17 IU/L (ref 0–44)
AST: 26 IU/L (ref 0–40)
Albumin/Globulin Ratio: 1.1 — ABNORMAL LOW (ref 1.2–2.2)
Albumin: 3.7 g/dL (ref 3.7–4.7)
Alkaline Phosphatase: 106 IU/L (ref 44–121)
BUN/Creatinine Ratio: 13 (ref 10–24)
BUN: 22 mg/dL (ref 8–27)
Bilirubin Total: 0.3 mg/dL (ref 0.0–1.2)
CO2: 16 mmol/L — ABNORMAL LOW (ref 20–29)
Calcium: 9 mg/dL (ref 8.6–10.2)
Chloride: 99 mmol/L (ref 96–106)
Creatinine, Ser: 1.76 mg/dL — ABNORMAL HIGH (ref 0.76–1.27)
GFR calc Af Amer: 42 mL/min/{1.73_m2} — ABNORMAL LOW (ref >59)
GFR calc non Af Amer: 37 mL/min/{1.73_m2} — ABNORMAL LOW (ref >59)
Globulin, Total: 3.3 g/dL (ref 1.5–4.5)
Glucose: 154 mg/dL — ABNORMAL HIGH (ref 65–99)
Potassium: 5.3 mmol/L — ABNORMAL HIGH (ref 3.5–5.2)
Sodium: 136 mmol/L (ref 134–144)
Total Protein: 7 g/dL (ref 6.0–8.5)

## 2020-03-20 LAB — CBC WITH DIFFERENTIAL/PLATELET
Basophils Absolute: 0.1 10*3/uL (ref 0.0–0.2)
Basos: 1 %
EOS (ABSOLUTE): 0.1 10*3/uL (ref 0.0–0.4)
Eos: 1 %
Hematocrit: 39.2 % (ref 37.5–51.0)
Hemoglobin: 13 g/dL (ref 13.0–17.7)
Immature Grans (Abs): 0.2 10*3/uL — ABNORMAL HIGH (ref 0.0–0.1)
Immature Granulocytes: 2 %
Lymphocytes Absolute: 1.3 10*3/uL (ref 0.7–3.1)
Lymphs: 13 %
MCH: 32.7 pg (ref 26.6–33.0)
MCHC: 33.2 g/dL (ref 31.5–35.7)
MCV: 99 fL — ABNORMAL HIGH (ref 79–97)
Monocytes Absolute: 0.8 10*3/uL (ref 0.1–0.9)
Monocytes: 8 %
Neutrophils Absolute: 7.5 10*3/uL — ABNORMAL HIGH (ref 1.4–7.0)
Neutrophils: 75 %
Platelets: 261 10*3/uL (ref 150–450)
RBC: 3.98 x10E6/uL — ABNORMAL LOW (ref 4.14–5.80)
RDW: 14 % (ref 11.6–15.4)
WBC: 10 10*3/uL (ref 3.4–10.8)

## 2020-03-20 LAB — AMMONIA: Ammonia: 63 ug/dL (ref 31–169)

## 2020-03-24 ENCOUNTER — Encounter (INDEPENDENT_AMBULATORY_CARE_PROVIDER_SITE_OTHER): Payer: Medicare Other | Admitting: Ophthalmology

## 2020-03-31 ENCOUNTER — Telehealth: Payer: Self-pay

## 2020-03-31 NOTE — Telephone Encounter (Signed)
Spoke with pt. He states that the EMS gave him an injection. He is not sure what it was. He feels better but sugar is now 300.   Instructed pt to give 35 units of Levemir and to still take his Metformin with dinner.  Keep a close eye on his sugars and let us know how they are running.  Informed pt that he is supposed to do 35-40 units BID of insulin and take Metformin twice per day. Pt understood.  Will send to [facility] to make aware and see if pt requires any changes.

## 2020-03-31 NOTE — Telephone Encounter (Signed)
FYI: Pt called to let Dr Dettinger know that his sugar dropped to 54 this morning and EMS had to be called. Pt doing ok now.

## 2020-04-01 ENCOUNTER — Ambulatory Visit: Payer: Medicare Other | Admitting: Family Medicine

## 2020-04-01 ENCOUNTER — Encounter (HOSPITAL_COMMUNITY): Payer: Self-pay | Admitting: Emergency Medicine

## 2020-04-01 ENCOUNTER — Emergency Department (HOSPITAL_COMMUNITY)
Admission: EM | Admit: 2020-04-01 | Discharge: 2020-04-01 | Disposition: A | Discharge status: Home / Self Care | Payer: No Typology Code available for payment source | Attending: Emergency Medicine | Admitting: Emergency Medicine

## 2020-04-01 ENCOUNTER — Emergency Department (HOSPITAL_COMMUNITY): Payer: No Typology Code available for payment source

## 2020-04-01 ENCOUNTER — Other Ambulatory Visit: Payer: Self-pay

## 2020-04-01 DIAGNOSIS — Z794 Long term (current) use of insulin: Secondary | ICD-10-CM | POA: Insufficient documentation

## 2020-04-01 DIAGNOSIS — H1031 Unspecified acute conjunctivitis, right eye: Secondary | ICD-10-CM

## 2020-04-01 DIAGNOSIS — S80211A Abrasion, right knee, initial encounter: Secondary | ICD-10-CM | POA: Diagnosis not present

## 2020-04-01 DIAGNOSIS — J449 Chronic obstructive pulmonary disease, unspecified: Secondary | ICD-10-CM | POA: Diagnosis not present

## 2020-04-01 DIAGNOSIS — E860 Dehydration: Secondary | ICD-10-CM | POA: Diagnosis not present

## 2020-04-01 DIAGNOSIS — S0591XA Unspecified injury of right eye and orbit, initial encounter: Secondary | ICD-10-CM | POA: Diagnosis present

## 2020-04-01 DIAGNOSIS — Z79899 Other long term (current) drug therapy: Secondary | ICD-10-CM | POA: Insufficient documentation

## 2020-04-01 DIAGNOSIS — Z7901 Long term (current) use of anticoagulants: Secondary | ICD-10-CM | POA: Insufficient documentation

## 2020-04-01 DIAGNOSIS — I2510 Atherosclerotic heart disease of native coronary artery without angina pectoris: Secondary | ICD-10-CM | POA: Diagnosis not present

## 2020-04-01 DIAGNOSIS — Z7984 Long term (current) use of oral hypoglycemic drugs: Secondary | ICD-10-CM | POA: Diagnosis not present

## 2020-04-01 DIAGNOSIS — W06XXXA Fall from bed, initial encounter: Secondary | ICD-10-CM | POA: Diagnosis not present

## 2020-04-01 DIAGNOSIS — E039 Hypothyroidism, unspecified: Secondary | ICD-10-CM | POA: Diagnosis not present

## 2020-04-01 DIAGNOSIS — I10 Essential (primary) hypertension: Secondary | ICD-10-CM | POA: Diagnosis not present

## 2020-04-01 DIAGNOSIS — S20311A Abrasion of right front wall of thorax, initial encounter: Secondary | ICD-10-CM | POA: Insufficient documentation

## 2020-04-01 DIAGNOSIS — Z950 Presence of cardiac pacemaker: Secondary | ICD-10-CM | POA: Insufficient documentation

## 2020-04-01 DIAGNOSIS — M546 Pain in thoracic spine: Secondary | ICD-10-CM | POA: Insufficient documentation

## 2020-04-01 DIAGNOSIS — E1140 Type 2 diabetes mellitus with diabetic neuropathy, unspecified: Secondary | ICD-10-CM | POA: Diagnosis not present

## 2020-04-01 DIAGNOSIS — S0501XA Injury of conjunctiva and corneal abrasion without foreign body, right eye, initial encounter: Secondary | ICD-10-CM

## 2020-04-01 DIAGNOSIS — Z87891 Personal history of nicotine dependence: Secondary | ICD-10-CM | POA: Insufficient documentation

## 2020-04-01 DIAGNOSIS — W19XXXA Unspecified fall, initial encounter: Secondary | ICD-10-CM

## 2020-04-01 LAB — COMPREHENSIVE METABOLIC PANEL
ALT: 21 U/L (ref 0–44)
AST: 27 U/L (ref 15–41)
Albumin: 2.9 g/dL — ABNORMAL LOW (ref 3.5–5.0)
Alkaline Phosphatase: 86 U/L (ref 38–126)
Anion gap: 11 (ref 5–15)
BUN: 35 mg/dL — ABNORMAL HIGH (ref 8–23)
CO2: 20 mmol/L — ABNORMAL LOW (ref 22–32)
Calcium: 8.5 mg/dL — ABNORMAL LOW (ref 8.9–10.3)
Chloride: 103 mmol/L (ref 98–111)
Creatinine, Ser: 1.97 mg/dL — ABNORMAL HIGH (ref 0.61–1.24)
GFR, Estimated: 35 mL/min — ABNORMAL LOW (ref >60)
Glucose, Bld: 213 mg/dL — ABNORMAL HIGH (ref 70–99)
Potassium: 5.1 mmol/L (ref 3.5–5.1)
Sodium: 134 mmol/L — ABNORMAL LOW (ref 135–145)
Total Bilirubin: 0.6 mg/dL (ref 0.3–1.2)
Total Protein: 7 g/dL (ref 6.5–8.1)

## 2020-04-01 LAB — CBC WITH DIFFERENTIAL/PLATELET
Abs Immature Granulocytes: 0.13 10*3/uL — ABNORMAL HIGH (ref 0.00–0.07)
Basophils Absolute: 0.1 10*3/uL (ref 0.0–0.1)
Basophils Relative: 1 %
Eosinophils Absolute: 0 10*3/uL (ref 0.0–0.5)
Eosinophils Relative: 0 %
HCT: 57.2 % — ABNORMAL HIGH (ref 39.0–52.0)
Hemoglobin: 18.7 g/dL — ABNORMAL HIGH (ref 13.0–17.0)
Immature Granulocytes: 2 %
Lymphocytes Relative: 12 %
Lymphs Abs: 0.7 10*3/uL (ref 0.7–4.0)
MCH: 31.9 pg (ref 26.0–34.0)
MCHC: 32.7 g/dL (ref 30.0–36.0)
MCV: 97.4 fL (ref 80.0–100.0)
Monocytes Absolute: 0.5 10*3/uL (ref 0.1–1.0)
Monocytes Relative: 8 %
Neutro Abs: 4.3 10*3/uL (ref 1.7–7.7)
Neutrophils Relative %: 77 %
Platelets: 102 10*3/uL — ABNORMAL LOW (ref 150–400)
RBC: 5.87 MIL/uL — ABNORMAL HIGH (ref 4.22–5.81)
RDW: 16.4 % — ABNORMAL HIGH (ref 11.5–15.5)
WBC: 5.6 10*3/uL (ref 4.0–10.5)
nRBC: 0 % (ref 0.0–0.2)

## 2020-04-01 LAB — CBG MONITORING, ED: Glucose-Capillary: 205 mg/dL — ABNORMAL HIGH (ref 70–99)

## 2020-04-01 LAB — CK: Total CK: 160 U/L (ref 49–397)

## 2020-04-01 MED ORDER — FLUORESCEIN SODIUM 1 MG OP STRP
1.0000 | ORAL_STRIP | Freq: Once | OPHTHALMIC | Status: AC
  Administered 2020-04-01: 1 via OPHTHALMIC
  Filled 2020-04-01: qty 1

## 2020-04-01 MED ORDER — ERYTHROMYCIN 5 MG/GM OP OINT
TOPICAL_OINTMENT | Freq: Once | OPHTHALMIC | Status: AC
  Filled 2020-04-01: qty 3.5

## 2020-04-01 MED ORDER — TETRACAINE HCL 0.5 % OP SOLN
2.0000 [drp] | Freq: Once | OPHTHALMIC | Status: AC
  Administered 2020-04-01: 2 [drp] via OPHTHALMIC
  Filled 2020-04-01: qty 4

## 2020-04-01 MED ORDER — SODIUM CHLORIDE 0.9 % IV BOLUS
500.0000 mL | Freq: Once | INTRAVENOUS | Status: AC
  Administered 2020-04-01: 500 mL via INTRAVENOUS

## 2020-04-01 NOTE — ED Provider Notes (Signed)
I provided a substantive portion of the care of this patient.  I personally performed the entirety of the history, exam and medical decision making for this encounter.      EKG Interpretation  Date/Time:  Wednesday April 01 2020 12:47:55 EST Ventricular Rate:  80 PR Interval:    QRS Duration: 147 QT Interval:  408 QTC Calculation: 471 R Axis:   52 Text Interpretation: VENTRICULAR PACED RHYTHM Confirmed by Fredia Sorrow [phone]) on 04/01/2020 12:50:16 PM  Patient seen by me along with physician assistant.  Patient with reported fall yesterday but very vague about the circumstances says that he rolled out of bed.  He was found by a neighbor on the floor not how sure how long he was on the floor.  EMS called.  his blood sugars were low they gave him what sounds like glucagon he woke up they did not transport him in not clear whether patient refused.  Patient does live by himself.  Patient presenting today with abrasions and bruising of some bruising to his nose and abrasion to his right cheek area.  An abrasion to his anterior abdomen measuring about 7 x 5 cm.  Also abrasions to his legs.  Seems a bit much for just fall out of bed.  Work-up will include all things trauma related including the head.  As well as CK.  Blood sugar here is 205.  CK came back at 160 patient's renal function has a GFR 35.  BUN and creatinine elevated.  Striking thing is been his right eye.  Patient does not wear contacts he has his glasses on.  The right eyes sclera is markedly injected no hyphema.  Some swelling around the eye marked purulent discharge.  The left eye is normal.  No pain with extraocular motion.  So not consistent with a orbital cellulitis.  Will Goeden get the CT scans and then will most likely discuss regarding the conjunctivitis after checking ocular pressures and checking for corneal abrasion with ophthalmology patient may have a significant bacterial conjunctivitis.   Fredia Sorrow,  MD 04/01/20 1425

## 2020-04-01 NOTE — ED Notes (Signed)
Attempting to get pt assistance at home.

## 2020-04-01 NOTE — ED Provider Notes (Signed)
[facility] EMERGENCY DEPARTMENT Provider Note   CSN: [id number] Arrival date & time: 04/01/20  1054     History Chief Complaint  Patient presents with  . Eye Pain    [name] is a 75 y.o. male.  [name] is a 75 y.o. male with a history of A. fib, hypertension, hyperlipidemia, hypothyroidism, obesity, BPH, essential tremor, who presents to the emergency department for evaluation of right eye pain, redness and drainage.  He reports this started yesterday.  Patient reports yesterday morning while he was sleeping he fell out of bed, and was found to have a sugar of 54, he does not remember what happened when he fell out of bed, EMS was called to help get him up because patient felt very weak and was unable to get up on his own.  They checked his sugar and gave him an injection, patient does not know what was given to him but after this his sugar improved and since then he has been taking his diabetes medications regularly.  He is not sure what he hit or if he lost consciousness as he does not remember the fall.  Patient assumes that he hit his eye and that is why it is red, and painful with drainage.  He has not seen any bruising around the eye.  Does not think he was having any problems with his eye prior to the fall.  Wears glasses, no contacts. Thinks he hit his head as well.  Initially denies any pain but then reports an abrasion to his chest wall on the right as well as his right knee that are sore.  Also reports some upper back pain.  Denies any abdominal pain.  No chest pain or shortness of breath.  Reports today he feels at baseline.  He reports he typically has difficulty walking, and that is not new.  Reports that he takes insulin and Metformin and has been taking these as per usual and has been eating and drinking normally.  No fevers or chills.  No other aggravating or alleviating factors.        Past Medical History:  Diagnosis Date  . Arthritis   . Atrial  fibrillation ([place])   . BPH (benign prostatic hypertrophy) 04/15/2010  . Cataract   . Colon polyps   . DEPRESSION 01/22/2009  . Heart valve replaced   . HYPERCHOLESTEROLEMIA 01/22/2009  . HYPERTENSION 01/22/2009   Dr. [name]  . 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 Morbid obesity ([place])   . Tremors of nervous system    ?ptsd  . Ulcer     Patient Active Problem List   Diagnosis Date Noted  . Hospital discharge follow-up 03/13/2020  . Ambulatory dysfunction 03/05/2020  . Essential tremor 03/05/2020  . Ataxia 03/03/2020  . Rash 01/03/2020  . Severe nonproliferative diabetic retinopathy of both eyes ([place]) 08/27/2019  . 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 Vitreous hemorrhage of left eye ([place]) 08/27/2019  . Posterior vitreous detachment of right eye 08/27/2019  . Severe nonproliferative diabetic retinopathy of right eye ([place]) 08/27/2019  . Frequency of urination and polyuria 01/29/2019  . Coronary artery disease involving native coronary artery of native heart without angina pectoris 01/27/2019  . Microalbuminuria 12/24/2018  . COPD (chronic obstructive pulmonary disease) ([place]) 06/25/2018  . Thrombocytopenia ([place]) 05/09/2018  . Diabetic neuropathy ([place]) 01/30/2018  . S/P AVR (aortic valve replacement) 11/04/2016  .  Atrial fibrillation ([place]) 09/27/2016  . Symptomatic bradycardia 06/12/2016  . Urgency incontinence 11/10/2015  . Erectile dysfunction 11/10/2015  . Hypothyroidism 01/15/2015  . 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 Phimosis 01/15/2015  . CHF (congestive heart failure) ([place]) 01/01/2015  . B12 deficiency 04/08/2014  . 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 Type 2 diabetes mellitus with diabetic neuropathy, unspecified ([place]) 03/12/2014  . 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 GERD (gastroesophageal reflux disease) 11/02/2012  . Dysuria 08/28/2012  . Obesity  08/04/2010  . Benign prostatic hyperplasia 04/15/2010  . CAROTID OCCLUSIVE DISEASE 01/26/2010  . MURMUR 05/27/2009  . Aortic valve disease 05/27/2009  . Hyperlipidemia with target LDL less than 100 01/22/2009  . Depression 01/22/2009  . Essential hypertension 01/22/2009    Past Surgical History:  Procedure Laterality Date  . 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 APPENDECTOMY    . bilateral cataract surg    . COLONOSCOPY  04/24/2007   [name]: normal  . COLONOSCOPY WITH ESOPHAGOGASTRODUODENOSCOPY (EGD) N/A 04/05/2012   Procedure: COLONOSCOPY WITH ESOPHAGOGASTRODUODENOSCOPY (EGD);  Surgeon: Daneil Dolin, MD;  Location: [facility];  Service: Endoscopy;  Laterality: N/A;  10;15  . DENTAL SURGERY  05/2004   Dental extractions  . EYE SURGERY Bilateral    cataract  . HEEL SPUR SURGERY Bilateral    resection of heel spur  . KNEE ARTHROSCOPY WITH LATERAL MENISECTOMY Right 04/04/2014   Procedure: KNEE ARTHROSCOPY WITH LATERAL MENISECTOMY;  Surgeon: Carole Civil, MD;  Location: [facility] ORS;  Service: Orthopedics;  Laterality: Right;  . PACEMAKER IMPLANT N/A 06/13/2016   Procedure: Pacemaker Implant- Dual Chamber;  Surgeon: Evans Lance, MD;  Location: [facility];  Service: Cardiovascular;  Laterality: N/A;  . PACEMAKER INSERTION  2018  . THYROIDECTOMY  03/21/2013   DR [name]  . THYROIDECTOMY N/A 03/21/2013   Procedure: THYROIDECTOMY;  Surgeon: [name], MD;  Location: [facility];  Service: General;  Laterality: N/A;  . TONSILLECTOMY         Family History  Problem Relation Age of Onset  . Heart disease Father   . Congestive Heart Failure Father   . Arthritis Father   . Diabetes Other   . Benign prostatic hyperplasia Brother     Social History   Tobacco Use  . Smoking status: Former Smoker    Packs/day: 0.00    Years: 12.00    Pack years: 0.00    Types: Cigarettes    Quit date: 09/20/1961    Years since quitting: 58.5  .  Smokeless tobacco: Never Used  Vaping Use  . Vaping Use: Never used  Substance Use Topics  . Alcohol use: Not Currently  . Drug use: No    Home Medications Prior to Admission medications   Medication Sig Start Date End Date Taking? Authorizing Provider  acetaminophen (TYLENOL) 500 MG tablet Take 1,000 mg by mouth every 6 (six) hours as needed (pain).    [provider]  apixaban (ELIQUIS) 5 MG TABS tablet Take 1 tablet (5 mg total) by mouth 2 (two) times daily. 02/17/20   Dettinger, Fransisca Kaufmann, MD  cholecalciferol (VITAMIN D) 1000 units tablet Take 1,000 Units by mouth daily.     [provider]  furosemide (LASIX) 20 MG tablet Take 1 tablet (20 mg total) by mouth daily. 04/17/17   Timmothy Euler, MD  gabapentin (NEURONTIN) 300 MG capsule Take 1 capsule (300 mg total) by mouth 2 (two) times daily.  02/17/20   Dettinger, Fransisca Kaufmann, MD  glucose blood test strip 1 strip by Does not apply route 3 (three) times daily. Uses true metrix strips (diabetic club) 05/12/16   [provider]  insulin detemir (LEVEMIR FLEXTOUCH) 100 UNIT/ML FlexPen Inject 35-40 Units into the skin 2 (two) times daily. Patient taking differently: Inject 30 Units into the skin 2 (two) times daily. 02/17/20   Dettinger, Fransisca Kaufmann, MD  levothyroxine (SYNTHROID) 175 MCG tablet Take 1 tablet (175 mcg total) by mouth daily before breakfast. 02/17/20   Dettinger, Fransisca Kaufmann, MD  lisinopril (ZESTRIL) 5 MG tablet Take 5 mg by mouth daily.    [provider]  loperamide (IMODIUM A-D) 2 MG tablet Take 1 tablet (2 mg total) by mouth 4 (four) times daily as needed for diarrhea or loose stools. 03/19/20   Dettinger, Fransisca Kaufmann, MD  metFORMIN (GLUCOPHAGE) 1000 MG tablet TAKE 1 TABLET BY MOUTH TWICE DAILY WITH A MEAL. Patient taking differently: Take 1,000 mg by mouth in the morning and at bedtime. 07/24/15   Timmothy Euler, MD  Multiple Vitamins-Minerals (MENS MULTI VITAMIN & MINERAL PO) Take 1 tablet by mouth  daily.      [provider]  Semaglutide, 1 MG/DOSE, (OZEMPIC, 1 MG/DOSE,) 2 MG/1.5ML SOPN Inject 1 mg into the skin once a week. 02/17/20   Dettinger, Fransisca Kaufmann, MD  simvastatin (ZOCOR) 80 MG tablet Take 0.5 tablets (40 mg total) by mouth at bedtime. 04/04/17   Timmothy Euler, MD  tamsulosin (FLOMAX) 0.4 MG CAPS capsule TAKE TWO CAPSULES BY MOUTH AT BEDTIME 03/16/20   Dettinger, Fransisca Kaufmann, MD  traMADol (ULTRAM) 50 MG tablet Take 50 mg by mouth 2 (two) times daily. 08/09/18   [provider]    Allergies    Phenothiazines and Actos [pioglitazone]  Review of Systems   Review of Systems  Constitutional: Positive for fatigue. Negative for chills and fever.  Eyes: Positive for photophobia, pain, discharge and redness. Negative for visual disturbance.  Respiratory: Negative for cough and shortness of breath.   Gastrointestinal: Negative for abdominal pain, diarrhea, nausea and vomiting.  Genitourinary: Negative for dysuria and frequency.  Musculoskeletal: Positive for arthralgias. Negative for joint swelling.  Skin: Positive for wound.  Neurological: Positive for weakness (Generalized). Negative for dizziness, syncope, facial asymmetry, speech difficulty, light-headedness, numbness and headaches.  All other systems reviewed and are negative.   Physical Exam Updated Vital Signs BP 114/88 (BP Location: Left Arm)   Pulse 70   Temp 98 F (36.7 C) (Oral)   Resp 17   Ht 6\' 1"  (1.854 m)   Wt 110.2 kg   SpO2 99%   BMI 32.06 kg/m   Physical Exam Vitals and nursing note reviewed.  Constitutional:      General: He is not in acute distress.    Appearance: He is well-developed. He is not diaphoretic.     Comments: Alert, Elderly gentleman, chronically ill-appearing but in no acute distress  HENT:     Head: Normocephalic.     Comments: There is a small abrasion to the right cheek below the eye, no ecchymosis, no hematoma, step-off or deformity over the scalp, negative battle  sign    Nose: Nose normal.     Comments: No tenderness or deformity    Mouth/Throat:     Mouth: Mucous membranes are moist.     Pharynx: Oropharynx is clear.  Eyes:     General:        Right eye:  Discharge present.        Left eye: No discharge.     Comments: Right left extremely erythematous with copious amounts of purulent drainage, little to no edema of the eyelids, no induration, no proptosis, EOMs intact, PERRLA, on fluorescein staining there is approximately a 50% corneal abrasion, negative Seidel sign, eye pressure of 19 mmHg. Left eye unremarkable. See photo below  Neck:     Comments: No midline C-spine tenderness, normal range of motion Cardiovascular:     Rate and Rhythm: Normal rate and regular rhythm.     Heart sounds: Normal heart sounds.  Pulmonary:     Effort: Pulmonary effort is normal. No respiratory distress.     Breath sounds: Normal breath sounds. No wheezing or rales.     Comments: Respirations are equal and unlabored and patient is able to speak in full sentences, lungs clear without focal wheezes, rales or rhonchi, there is some tenderness over the right lower chest wall and patient has a 5 cm round abrasion in this area that looks like carpet burn, no surrounding erythema or signs of infection Chest:     Chest wall: Tenderness present.  Abdominal:     General: Bowel sounds are normal. There is no distension.     Palpations: Abdomen is soft. There is no mass.     Tenderness: There is no abdominal tenderness. There is no guarding.  Musculoskeletal:        General: No deformity.     Cervical back: Neck supple.     Comments: Patient with some right knee pain with abrasion noted over the anterior knee, no surrounding erythema, no effusion, able to flex and extend the knee, no tenderness over the hip or ankle.  No tenderness over the left lower extremity. No midline spinal tenderness.  All other joints supple knees and movable, all compartments soft.  Skin:     General: Skin is warm and dry.     Capillary Refill: Capillary refill takes less than 2 seconds.  Neurological:     Mental Status: He is alert.     Coordination: Coordination normal.     Comments: Speech is clear, able to follow commands CN III-XII intact Normal strength in upper and lower extremities bilaterally including dorsiflexion and plantar flexion, strong and equal grip strength Sensation normal to light and sharp touch Moves extremities without ataxia, coordination intact  Psychiatric:        Mood and Affect: Mood normal.        Behavior: Behavior normal.     ED Results / Procedures / Treatments   Labs (all labs ordered are listed, but only abnormal results are displayed) Labs Reviewed  CBC WITH DIFFERENTIAL/PLATELET - Abnormal; Notable for the following components:      Result Value   RBC 5.87 (*)    Hemoglobin 18.7 (*)    HCT 57.2 (*)    RDW 16.4 (*)    Platelets 102 (*)    Abs Immature Granulocytes 0.13 (*)    All other components within normal limits  COMPREHENSIVE METABOLIC PANEL - Abnormal; Notable for the following components:   Sodium 134 (*)    CO2 20 (*)    Glucose, Bld 213 (*)    BUN 35 (*)    Creatinine, Ser 1.97 (*)    Calcium 8.5 (*)    Albumin 2.9 (*)    GFR, Estimated 35 (*)    All other components within normal limits  CBG MONITORING, ED - Abnormal; Notable  for the following components:   Glucose-Capillary 205 (*)    All other components within normal limits  CK    EKG EKG Interpretation  Date/Time:  Wednesday April 01 2020 12:47:55 EST Ventricular Rate:  80 PR Interval:    QRS Duration: 147 QT Interval:  408 QTC Calculation: 471 R Axis:   52 Text Interpretation: VENTRICULAR PACED RHYTHM Confirmed by Fredia Sorrow [phone]) on 04/01/2020 12:50:16 PM   Radiology DG Chest 2 View  Result Date: 04/01/2020 CLINICAL DATA:  Fall EXAM: CHEST - 2 VIEW COMPARISON:  April 30, 2019. FINDINGS: The heart size and mediastinal contours are within  normal limits. Aortic atherosclerosis. Left subclavian approach cardiac rhythm maintenance device. Median sternotomy. Both lungs are clear. No visible pleural effusions or pneumothorax. No acute osseous abnormality. Clips in the lower neck. IMPRESSION: No active cardiopulmonary disease. Electronically Signed   By: Margaretha Sheffield MD   On: 04/01/2020 13:45   CT Head Wo Contrast  Result Date: 04/01/2020 CLINICAL DATA:  Head trauma. Right eye pain with redness and drainage. EXAM: CT HEAD WITHOUT CONTRAST CT MAXILLOFACIAL WITHOUT CONTRAST CT CERVICAL SPINE WITHOUT CONTRAST TECHNIQUE: Multidetector CT imaging of the head, cervical spine, and maxillofacial structures were performed using the standard protocol without intravenous contrast. Multiplanar CT image reconstructions of the cervical spine and maxillofacial structures were also generated. COMPARISON:  CT head February 2022. FINDINGS: CT HEAD FINDINGS Brain: No evidence of acute infarction, hemorrhage, hydrocephalus, extra-axial collection or mass lesion/mass effect. Similar generalized cerebral volume loss with ex vacuo ventricular dilation. Similar mild patchy white matter hypoattenuation, most likely related to chronic microvascular ischemic disease. Similar prominent retro cerebellar CSF, which may represent arachnoid cyst or dilated mega cisterna magna. Vascular: Calcific atherosclerosis. No hyperdense vessel identified. Skull: No acute fracture. Other: No mastoid effusions. CT MAXILLOFACIAL FINDINGS Osseous: No fracture or mandibular dislocation. No destructive process. Orbits: Mild right preseptal periorbital soft tissue stranding/edema. No postseptal hemorrhage. The globes appear unremarkable and symmetric. Normal position of the lens. Similar size of the anterior chamber bilaterally. No proptosis. Sinuses: Frothy secretions in bilateral sphenoid sinuses. Mild ethmoid air cell and maxillary sinus mucosal thickening. Atretic frontal sinuses. Soft  tissues: Right periorbital soft tissue findings are detailed above. CT CERVICAL SPINE FINDINGS Alignment: Reversal of the normal cervical lordosis. Slight (2 mm) of anterolisthesis of C3 on C4) likely degenerative given degenerative changes at this level. Otherwise, no substantial sagittal subluxation. Broad dextrocurvature. Skull base and vertebrae: Vertebral body heights are maintained. Motion limited evaluation without evidence of acute fracture. Soft tissues and spinal canal: No prevertebral fluid or swelling. No visible canal hematoma. Disc levels: Mild multilevel degenerative change and multilevel facet arthropathy. No high-grade bony canal stenosis. Upper chest: Visualized lung apices are clear. Other: Evidence of prior thyroidectomy. IMPRESSION: CT head: No evidence of acute intracranial abnormality. CT maxillofacial: 1. Mild right preseptal periorbital soft tissue stranding/edema, likely secondary to reported contusion/trauma. No postseptal hemorrhage or evidence of acute fracture. The globes appear unremarkable and symmetric. 2. Frothy secretions in bilateral sphenoid sinuses with mild ethmoid and maxillary sinus mucosal thickening CT cervical spine: No evidence of acute fracture or traumatic malalignment. Electronically Signed   By: Margaretha Sheffield MD   On: 04/01/2020 14:06   CT Cervical Spine Wo Contrast  Result Date: 04/01/2020 CLINICAL DATA:  Head trauma. Right eye pain with redness and drainage. EXAM: CT HEAD WITHOUT CONTRAST CT MAXILLOFACIAL WITHOUT CONTRAST CT CERVICAL SPINE WITHOUT CONTRAST TECHNIQUE: Multidetector CT imaging of the head, cervical spine, and maxillofacial  structures were performed using the standard protocol without intravenous contrast. Multiplanar CT image reconstructions of the cervical spine and maxillofacial structures were also generated. COMPARISON:  CT head February 2022. FINDINGS: CT HEAD FINDINGS Brain: No evidence of acute infarction, hemorrhage, hydrocephalus,  extra-axial collection or mass lesion/mass effect. Similar generalized cerebral volume loss with ex vacuo ventricular dilation. Similar mild patchy white matter hypoattenuation, most likely related to chronic microvascular ischemic disease. Similar prominent retro cerebellar CSF, which may represent arachnoid cyst or dilated mega cisterna magna. Vascular: Calcific atherosclerosis. No hyperdense vessel identified. Skull: No acute fracture. Other: No mastoid effusions. CT MAXILLOFACIAL FINDINGS Osseous: No fracture or mandibular dislocation. No destructive process. Orbits: Mild right preseptal periorbital soft tissue stranding/edema. No postseptal hemorrhage. The globes appear unremarkable and symmetric. Normal position of the lens. Similar size of the anterior chamber bilaterally. No proptosis. Sinuses: Frothy secretions in bilateral sphenoid sinuses. Mild ethmoid air cell and maxillary sinus mucosal thickening. Atretic frontal sinuses. Soft tissues: Right periorbital soft tissue findings are detailed above. CT CERVICAL SPINE FINDINGS Alignment: Reversal of the normal cervical lordosis. Slight (2 mm) of anterolisthesis of C3 on C4) likely degenerative given degenerative changes at this level. Otherwise, no substantial sagittal subluxation. Broad dextrocurvature. Skull base and vertebrae: Vertebral body heights are maintained. Motion limited evaluation without evidence of acute fracture. Soft tissues and spinal canal: No prevertebral fluid or swelling. No visible canal hematoma. Disc levels: Mild multilevel degenerative change and multilevel facet arthropathy. No high-grade bony canal stenosis. Upper chest: Visualized lung apices are clear. Other: Evidence of prior thyroidectomy. IMPRESSION: CT head: No evidence of acute intracranial abnormality. CT maxillofacial: 1. Mild right preseptal periorbital soft tissue stranding/edema, likely secondary to reported contusion/trauma. No postseptal hemorrhage or evidence of  acute fracture. The globes appear unremarkable and symmetric. 2. Frothy secretions in bilateral sphenoid sinuses with mild ethmoid and maxillary sinus mucosal thickening CT cervical spine: No evidence of acute fracture or traumatic malalignment. Electronically Signed   By: Margaretha Sheffield MD   On: 04/01/2020 14:06   DG Knee Complete 4 Views Right  Result Date: 04/01/2020 CLINICAL DATA:  Fall yesterday with right knee pain EXAM: RIGHT KNEE - COMPLETE 4+ VIEW COMPARISON:  02/04/2014 right knee radiographs FINDINGS: No fracture, dislocation, joint effusion or suspicious focal osseous lesions. Moderate superior and inferior right patellar enthesophytes. Small tibial tubercle enthesophyte. Minimal lateral compartment right knee osteoarthritis. Preserved patellofemoral and medial compartments. No radiopaque foreign bodies. Vascular calcifications in the posterior soft tissues. IMPRESSION: 1. No right knee fracture, joint effusion or malalignment. 2. Minimal lateral compartment right knee osteoarthritis. 3. Patellar and tibial tubercle enthesophytes. Electronically Signed   By: Ilona Sorrel M.D.   On: 04/01/2020 13:44   CT Maxillofacial Wo Contrast  Result Date: 04/01/2020 CLINICAL DATA:  Head trauma. Right eye pain with redness and drainage. EXAM: CT HEAD WITHOUT CONTRAST CT MAXILLOFACIAL WITHOUT CONTRAST CT CERVICAL SPINE WITHOUT CONTRAST TECHNIQUE: Multidetector CT imaging of the head, cervical spine, and maxillofacial structures were performed using the standard protocol without intravenous contrast. Multiplanar CT image reconstructions of the cervical spine and maxillofacial structures were also generated. COMPARISON:  CT head February 2022. FINDINGS: CT HEAD FINDINGS Brain: No evidence of acute infarction, hemorrhage, hydrocephalus, extra-axial collection or mass lesion/mass effect. Similar generalized cerebral volume loss with ex vacuo ventricular dilation. Similar mild patchy white matter hypoattenuation,  most likely related to chronic microvascular ischemic disease. Similar prominent retro cerebellar CSF, which may represent arachnoid cyst or dilated mega cisterna magna. Vascular: Calcific atherosclerosis.  No hyperdense vessel identified. Skull: No acute fracture. Other: No mastoid effusions. CT MAXILLOFACIAL FINDINGS Osseous: No fracture or mandibular dislocation. No destructive process. Orbits: Mild right preseptal periorbital soft tissue stranding/edema. No postseptal hemorrhage. The globes appear unremarkable and symmetric. Normal position of the lens. Similar size of the anterior chamber bilaterally. No proptosis. Sinuses: Frothy secretions in bilateral sphenoid sinuses. Mild ethmoid air cell and maxillary sinus mucosal thickening. Atretic frontal sinuses. Soft tissues: Right periorbital soft tissue findings are detailed above. CT CERVICAL SPINE FINDINGS Alignment: Reversal of the normal cervical lordosis. Slight (2 mm) of anterolisthesis of C3 on C4) likely degenerative given degenerative changes at this level. Otherwise, no substantial sagittal subluxation. Broad dextrocurvature. Skull base and vertebrae: Vertebral body heights are maintained. Motion limited evaluation without evidence of acute fracture. Soft tissues and spinal canal: No prevertebral fluid or swelling. No visible canal hematoma. Disc levels: Mild multilevel degenerative change and multilevel facet arthropathy. No high-grade bony canal stenosis. Upper chest: Visualized lung apices are clear. Other: Evidence of prior thyroidectomy. IMPRESSION: CT head: No evidence of acute intracranial abnormality. CT maxillofacial: 1. Mild right preseptal periorbital soft tissue stranding/edema, likely secondary to reported contusion/trauma. No postseptal hemorrhage or evidence of acute fracture. The globes appear unremarkable and symmetric. 2. Frothy secretions in bilateral sphenoid sinuses with mild ethmoid and maxillary sinus mucosal thickening CT cervical  spine: No evidence of acute fracture or traumatic malalignment. Electronically Signed   By: Margaretha Sheffield MD   On: 04/01/2020 14:06    Procedures Procedures   Medications Ordered in ED Medications  fluorescein ophthalmic strip 1 strip (1 strip Right Eye Given 04/01/20 1257)  tetracaine (PONTOCAINE) 0.5 % ophthalmic solution 2 drop (2 drops Right Eye Given 04/01/20 1257)  sodium chloride 0.9 % bolus 500 mL (0 mLs Intravenous Stopped 04/01/20 1459)  erythromycin ophthalmic ointment ( Right Eye Given 04/01/20 1506)    ED Course  I have reviewed the triage vital signs and the nursing notes.  Pertinent labs & imaging results that were available during my care of the patient were reviewed by me and considered in my medical decision making (see chart for details).    MDM Rules/Calculators/A&P                         75 year old male presents via EMS for evaluation of right eye redness, pain and drainage after he thinks he may have hit it yesterday when he fell out of bed, was found to be hypoglycemic, does not know how long he was on the floor, but was not transported to the hospital yesterday.  Patient unable to provide much history.  His eye appears more infectious, rather than traumatic, but he denies any symptoms prior to yesterday.  Does not wear contacts.  Will initiate work-up with labs including CK given unknown downtime, will also get CT of the head, neck and maxillofacial bones as well as chest x-ray and x-ray of the right knee.  Patient appears dehydrated, will give small fluid bolus.  Fluorescein staining of the eye reveals a large, nearly 50% corneal abrasion, but no evidence of perforation, normal eye pressure.  I have independently ordered, reviewed and interpreted all labs and imaging: CBC: No leukocytosis, hemoglobin of 18.7, may be some degree of hemoconcentration CMP: Glucose 213, CO2 20, creatinine of 1.97 very slightly increased from prior at 1.6, with elevated BUN of 35  suspected dehydration, patient on the ground for an unknown amount of time, normal LFTs  CK: Not elevated  CT of the head and cervical spine without acute traumatic injuries noted. CT maxillofacial with mild right preseptal periorbital soft tissue stranding and edema, which could be in the setting of trauma, no post septal hemorrhage or evidence of acute fracture, globes appear unremarkable and symmetric.  Some secretions noted in bilateral sphenoid sinuses and some mucosal thickening present.  Chest x-ray with no active cardiopulmonary disease.  Right knee x-ray with no evidence of fracture or dislocation.  Despite confusion regarding this event patient is alert and oriented, his blood sugars have been elevated here with no evidence of hypoglycemia and lab work is overall reassuring.  I discussed eye infection with Dr. Satira Sark with ophthalmology, given no significant periorbital cellulitis or proptosis, he recommends continuing to cover with topical erythromycin, but does not think the patient needs systemic antibiotics.  He can see the patient in the office for follow-up on Friday.  Patient having difficulty getting around at home on his own, he was provided a walker.  I discussed with social work, regarding home health care, unfortunately because the patient has [employer] he is very limited and they are is unable to take him on as a patient, will have him discuss with his primary care provider for further resources, patient specifically wants to go home and has no interest in placement and I think this is reasonable.  He has been able to get around the ED using walker that was provided to him today.  Discussed strict return precautions.  Patient expresses understanding and agreement with plan.  Discharged home in good condition.     Final Clinical Impression(s) / ED Diagnoses Final diagnoses:  Abrasion of right cornea, initial encounter  Acute bacterial conjunctivitis of right eye  Fall,  initial encounter  Dehydration    Rx / DC Orders ED Discharge Orders    None       Janet Berlin 04/03/20 2120    Fredia Sorrow, MD 04/14/20 [phone]

## 2020-04-01 NOTE — Discharge Instructions (Signed)
You have a corneal abrasion and infection of the right eye.  Please use the eye ointment that was given to you today by applying about 1/2 inch of ointment to the lower eyelid 4 times a day.  It is very important that she use this ointment regularly.  To help with eye drainage you can use cool compresses over the eye, make sure you use a clean cloth each time.  Avoid touching the eye as much as possible, make sure you wash your hands before applying eye ointment.  You can call your eye doctor or call Dr. Satira Sark with ophthalmology, he can see you Friday morning for an appointment, call his office to get appointment time.  It is important that you follow-up to ensure her eye is healing appropriately.  Return to the ED if you develop vision changes, fevers, swelling around the eye or increased eye pain.  Your lab work shows that you are a bit dehydrated, you will need to follow-up with your primary care provider to have your kidney function rechecked.  Make sure you are drinking enough water.  Use walker that was given to you today to help you get around her home more safely.  Because you are on blood thinners if you do have a fall it is important that she come to the emergency department to be evaluated.  Take your diabetes medications as directed and make sure you are eating regular meals with these medicines.  Return for any new or worsening symptoms.

## 2020-04-01 NOTE — Telephone Encounter (Signed)
Sounds good, the probably give him a steroid, just let me know how his blood sugars run over the next couple days.

## 2020-04-01 NOTE — ED Triage Notes (Signed)
Pt to the ED with c/o rt eye pain with redness and drainage present.  Pt states he fell out of bed yesterday and hit his eye on something possibly the nightstand.

## 2020-04-01 NOTE — Telephone Encounter (Signed)
Patient aware.

## 2020-04-03 ENCOUNTER — Telehealth: Payer: Self-pay

## 2020-04-03 NOTE — Telephone Encounter (Signed)
Have him cut back on his Levemir and only do the 30 units at nighttime

## 2020-04-03 NOTE — Telephone Encounter (Signed)
Left detailed message with changes  - pt aware on VM to call if he has any questions

## 2020-04-03 NOTE — Telephone Encounter (Signed)
Patient reports his glucose readings are reading low in mornings 65-75. EMS had to come earlier this week due to low sugar. States he has   been taking his Metformin twice daily and Levemir at bedtime 35 units.   Please advise

## 2020-04-06 ENCOUNTER — Other Ambulatory Visit: Payer: Self-pay

## 2020-04-06 ENCOUNTER — Ambulatory Visit (INDEPENDENT_AMBULATORY_CARE_PROVIDER_SITE_OTHER): Payer: Medicare Other | Admitting: Nurse Practitioner

## 2020-04-06 ENCOUNTER — Encounter: Payer: Self-pay | Admitting: Nurse Practitioner

## 2020-04-06 VITALS — BP 109/62 | HR 79 | Temp 97.0°F

## 2020-04-06 DIAGNOSIS — S81812A Laceration without foreign body, left lower leg, initial encounter: Secondary | ICD-10-CM

## 2020-04-06 DIAGNOSIS — E162 Hypoglycemia, unspecified: Secondary | ICD-10-CM

## 2020-04-06 DIAGNOSIS — B356 Tinea cruris: Secondary | ICD-10-CM | POA: Diagnosis not present

## 2020-04-06 LAB — GLUCOSE HEMOCUE WAIVED: Glu Hemocue Waived: 320 mg/dL — ABNORMAL HIGH (ref 65–99)

## 2020-04-06 MED ORDER — MICONAZOLE NITRATE POWD: 1.0000 "application " | Freq: Two times a day (BID) | 0 refills | Status: DC

## 2020-04-06 MED ORDER — CEPHALEXIN 500 MG PO CAPS: 500.0000 mg | ORAL_CAPSULE | Freq: Two times a day (BID) | ORAL | 0 refills | Status: DC

## 2020-04-06 MED ORDER — HYDROCORTISONE 1 % EX CREA: 1.0000 "application " | TOPICAL_CREAM | Freq: Two times a day (BID) | CUTANEOUS | 0 refills | Status: DC

## 2020-04-06 NOTE — Progress Notes (Signed)
Acute Office Visit  Subjective:    Patient ID: [name], male    DOB: [birth year], 75 y.o.   MRN: [id number]  Chief Complaint  Patient presents with  . Fall    Patient states he fell out of his bed x 5 days ago when his BS was 45. EMS came- Has few sores on body- chest & bilateral knees    Fall The accident occurred 5 to 7 days ago. The fall occurred from a bed. He landed on carpet. There was no blood loss. The pain is present in the right knee and left knee (chest). The pain is mild. Pertinent negatives include no headaches, numbness or tingling. He has tried nothing for the symptoms.  Patient also reports low blood sugar was a cause of his fall.  Blood sugar was 45 after incident, EMS was called to the scene patient was rehydrated with dextrose but did not go to the hospital.  Patient is following up today for skin tears obtained from the fall on mid chest between breast area and bilateral knee.  Tinea cruris Patient presents with bilateral rash upper groin area.  Increase itching and irritation.  Skin is erythematous but not warm to touch.  Worsening symptoms due to dependence and moisture.  This has been ongoing in the past few weeks and not resolving.  Past Medical History:  Diagnosis Date  . Arthritis   . Atrial fibrillation ([place])   . BPH (benign prostatic hypertrophy) 04/15/2010  . Cataract   . Colon polyps   . DEPRESSION 01/22/2009  . Heart valve replaced   . HYPERCHOLESTEROLEMIA 01/22/2009  . HYPERTENSION 01/22/2009   Dr. [name]  . 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 Morbid obesity ([place])   . Tremors of nervous system    ?ptsd  . Ulcer     Past Surgical History:  Procedure Laterality Date  . 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 APPENDECTOMY    . bilateral cataract surg    . COLONOSCOPY  04/24/2007   [name]: normal  . COLONOSCOPY WITH ESOPHAGOGASTRODUODENOSCOPY (EGD) N/A 04/05/2012    Procedure: COLONOSCOPY WITH ESOPHAGOGASTRODUODENOSCOPY (EGD);  Surgeon: Daneil Dolin, MD;  Location: [facility];  Service: Endoscopy;  Laterality: N/A;  10;15  . DENTAL SURGERY  05/2004   Dental extractions  . EYE SURGERY Bilateral    cataract  . HEEL SPUR SURGERY Bilateral    resection of heel spur  . KNEE ARTHROSCOPY WITH LATERAL MENISECTOMY Right 04/04/2014   Procedure: KNEE ARTHROSCOPY WITH LATERAL MENISECTOMY;  Surgeon: Carole Civil, MD;  Location: [facility];  Service: Orthopedics;  Laterality: Right;  . PACEMAKER IMPLANT N/A 06/13/2016   Procedure: Pacemaker Implant- Dual Chamber;  Surgeon: Evans Lance, MD;  Location: [facility];  Service: Cardiovascular;  Laterality: N/A;  . PACEMAKER INSERTION  2018  . THYROIDECTOMY  03/21/2013   DR [name]  . THYROIDECTOMY N/A 03/21/2013   Procedure: THYROIDECTOMY;  Surgeon: [name], MD;  Location: [place] [facility];  Service: General;  Laterality: N/A;  . TONSILLECTOMY      Family History  Problem Relation Age of Onset  . Heart disease Father   . Congestive Heart Failure Father   . Arthritis Father   . Diabetes Other   . Benign prostatic hyperplasia Brother     Social History   Socioeconomic History  . Marital status: Single    Spouse name: Not on file  .  Number of children: 1  . Years of education: HS  . Highest education level: Not on file  Occupational History  . Occupation: Administrator, retired    Fish farm manager: RETIRED  Tobacco Use  . Smoking status: Former Smoker    Packs/day: 0.00    Years: 12.00    Pack years: 0.00    Types: Cigarettes    Quit date: 09/20/1961    Years since quitting: 58.5  . Smokeless tobacco: Never Used  Vaping Use  . Vaping Use: Never used  Substance and Sexual Activity  . Alcohol use: Not Currently  . Drug use: No  . Sexual activity: Never    Comment: Has not smoked in 20 years  Other Topics Concern  . Not on file  Social History Narrative   Lives alone.   Quit smoking  approximately 28 years ago.   Long haul truck driver, lives in [place].   Social Determinants of Health   Financial Resource Strain: Not on file  Food Insecurity: Not on file  Transportation Needs: Not on file  Physical Activity: Not on file  Stress: Not on file  Social Connections: Not on file  Intimate Partner Violence: Not on file    Outpatient Medications Prior to Visit  Medication Sig Dispense Refill  . acetaminophen (TYLENOL) 500 MG tablet Take 1,000 mg by mouth every 6 (six) hours as needed (pain).    Marland Kitchen apixaban (ELIQUIS) 5 MG TABS tablet Take 1 tablet (5 mg total) by mouth 2 (two) times daily. 60 tablet 3  . cholecalciferol (VITAMIN D) 1000 units tablet Take 1,000 Units by mouth daily.     . furosemide (LASIX) 20 MG tablet Take 1 tablet (20 mg total) by mouth daily. 30 tablet 1  . gabapentin (NEURONTIN) 300 MG capsule Take 1 capsule (300 mg total) by mouth 2 (two) times daily. 180 capsule 3  . glucose blood test strip 1 strip by Does not apply route 3 (three) times daily. Uses true metrix strips (diabetic club)  12  . insulin detemir (LEVEMIR FLEXTOUCH) 100 UNIT/ML FlexPen Inject 35-40 Units into the skin 2 (two) times daily. (Patient taking differently: Inject 30 Units into the skin 2 (two) times daily.) 25 mL 11  . levothyroxine (SYNTHROID) 175 MCG tablet Take 1 tablet (175 mcg total) by mouth daily before breakfast. 90 tablet 3  . lisinopril (ZESTRIL) 5 MG tablet Take 5 mg by mouth daily.    Marland Kitchen loperamide (IMODIUM A-D) 2 MG tablet Take 1 tablet (2 mg total) by mouth 4 (four) times daily as needed for diarrhea or loose stools. 30 tablet 0  . metFORMIN (GLUCOPHAGE) 1000 MG tablet TAKE 1 TABLET BY MOUTH TWICE DAILY WITH A MEAL. (Patient taking differently: Take 1,000 mg by mouth in the morning and at bedtime.) 180 tablet 0  . Multiple Vitamins-Minerals (MENS MULTI VITAMIN & MINERAL PO) Take 1 tablet by mouth daily.      . Semaglutide, 1 MG/DOSE, (OZEMPIC, 1 MG/DOSE,) 2 MG/1.5ML  SOPN Inject 1 mg into the skin once a week. 4.5 mL 3  . simvastatin (ZOCOR) 80 MG tablet Take 0.5 tablets (40 mg total) by mouth at bedtime. 15 tablet 0  . tamsulosin (FLOMAX) 0.4 MG CAPS capsule TAKE TWO CAPSULES BY MOUTH AT BEDTIME 180 capsule 0  . traMADol (ULTRAM) 50 MG tablet Take 50 mg by mouth 2 (two) times daily.     No facility-administered medications prior to visit.    Allergies  Allergen Reactions  . Phenothiazines Anaphylaxis  .  Actos [Pioglitazone] Swelling    Review of Systems  Constitutional: Negative.   HENT: Negative.   Eyes: Negative.   Respiratory: Negative.   Cardiovascular: Negative.   Gastrointestinal: Negative.   Genitourinary: Negative.   Skin: Positive for color change and wound.  Neurological: Negative for tingling, numbness and headaches.  All other systems reviewed and are negative.      Objective:    Physical Exam Vitals reviewed.  Constitutional:      Appearance: Normal appearance. He is obese.     Interventions: Face mask in place.       Comments: Healing skin tear location right lower breast.  Skin is crusted over mild redness but not warm to touch. Bilateral knee crusted over with skin around wound erythematous.  HENT:     Head: Normocephalic.     Nose: Nose normal.  Eyes:     Conjunctiva/sclera: Conjunctivae normal.     Pupils: Pupils are equal, round, and reactive to light.  Cardiovascular:     Pulses: Normal pulses.     Heart sounds: Normal heart sounds.  Pulmonary:     Effort: Pulmonary effort is normal.     Breath sounds: Normal breath sounds.  Abdominal:     General: Bowel sounds are normal.  Neurological:     Mental Status: He is alert.  Psychiatric:        Behavior: Behavior normal. Behavior is cooperative.     BP 109/62   Pulse 79   Temp (!) 97 F (36.1 C) (Temporal)   SpO2 98%  Wt Readings from Last 3 Encounters:  04/01/20 243 lb (110.2 kg)  03/19/20 243 lb (110.2 kg)  03/13/20 244 lb 6.4 oz (110.9 kg)     There are no preventive care reminders to display for this patient.  There are no preventive care reminders to display for this patient.   Lab Results  Component Value Date   TSH 1.557 03/03/2020   Lab Results  Component Value Date   WBC 5.6 04/01/2020   HGB 18.7 (H) 04/01/2020   HCT 57.2 (H) 04/01/2020   MCV 97.4 04/01/2020   PLT 102 (L) 04/01/2020   Lab Results  Component Value Date   NA 134 (L) 04/01/2020   K 5.1 04/01/2020   CO2 20 (L) 04/01/2020   GLUCOSE 213 (H) 04/01/2020   BUN 35 (H) 04/01/2020   CREATININE 1.97 (H) 04/01/2020   BILITOT 0.6 04/01/2020   ALKPHOS 86 04/01/2020   AST 27 04/01/2020   ALT 21 04/01/2020   PROT 7.0 04/01/2020   ALBUMIN 2.9 (L) 04/01/2020   CALCIUM 8.5 (L) 04/01/2020   ANIONGAP 11 04/01/2020   Lab Results  Component Value Date   CHOL 135 03/04/2020   Lab Results  Component Value Date   HDL 46 03/04/2020   Lab Results  Component Value Date   LDLCALC 74 03/04/2020   Lab Results  Component Value Date   TRIG 75 03/04/2020   Lab Results  Component Value Date   CHOLHDL 2.9 03/04/2020   Lab Results  Component Value Date   HGBA1C 8.1 (H) 02/17/2020       Assessment & Plan:   Problem List Items Addressed This Visit      Musculoskeletal and Integument   Skin tear of left lower leg without complication - Primary    Patient fell from his bed due to low blood sugar few days ago.  Patient is reporting mild pain.  On assessment wound is crusted over but  with red warm base.  Wound located right lower breast area, bilateral knee. With a history of type 2 diabetes, blood sugar elevated today 350.  Encourage patient to take medication as prescribed.  Started patient on Keflex for 14 days. Follow-up with worsening unresolved symptoms  Rx sent to pharmacy.      Relevant Medications   hydrocortisone cream 1 %   cephALEXin (KEFLEX) 500 MG capsule   Tinea cruris    Symptoms not well controlled.  Advised patient to keep skin  clean and dry, apply hydrocortisone cream, miconazole powder ordered, follow-up with worsening symptoms medication sent to pharmacy education printed out to patient.      Relevant Medications   hydrocortisone cream 1 %   miconazole nitrate (MICATIN) POWD   cephALEXin (KEFLEX) 500 MG capsule    Other Visit Diagnoses    Low blood sugar       Relevant Orders   Glucose Hemocue Waived       Meds ordered this encounter  Medications  . hydrocortisone cream 1 %    Sig: Apply 1 application topically 2 (two) times daily.    Dispense:  30 g    Refill:  0    Order Specific Question:   Supervising Provider    Answer:   Janora Norlander [[id number]]  . miconazole nitrate (MICATIN) POWD    Sig: Apply 1 application topically 2 (two) times daily.    Dispense:  100 g    Refill:  0    Order Specific Question:   Supervising Provider    Answer:   Janora Norlander [[id number]]  . cephALEXin (KEFLEX) 500 MG capsule    Sig: Take 1 capsule (500 mg total) by mouth 2 (two) times daily.    Dispense:  28 capsule    Refill:  0    Order Specific Question:   Supervising Provider    Answer:   Janora Norlander [[id number]]     Ivy Lynn, NP

## 2020-04-06 NOTE — Assessment & Plan Note (Signed)
Patient fell from his bed due to low blood sugar few days ago.  Patient is reporting mild pain.  On assessment wound is crusted over but with red warm base.  Wound located right lower breast area, bilateral knee. With a history of type 2 diabetes, blood sugar elevated today 350.  Encourage patient to take medication as prescribed.  Started patient on Keflex for 14 days. Follow-up with worsening unresolved symptoms  Rx sent to pharmacy.

## 2020-04-06 NOTE — Patient Instructions (Signed)
Rash, Adult  A rash is a change in the color of your skin. A rash can also change the way your skin feels. There are many different conditions and factors that can cause a rash. Follow these instructions at home: The goal of treatment is to stop the itching and keep the rash from spreading. Watch for any changes in your symptoms. Let your doctor know about them. Follow these instructions to help with your condition: Medicine Take or apply over-the-counter and prescription medicines only as told by your doctor. These may include medicines:  To treat red or swollen skin (corticosteroid creams).  To treat itching.  To treat an allergy (oral antihistamines).  To treat very bad symptoms (oral corticosteroids).   Skin care  Put cool cloths (compresses) on the affected areas.  Do not scratch or rub your skin.  Avoid covering the rash. Make sure that the rash is exposed to air as much as possible. Managing itching and discomfort  Avoid hot showers or baths. These can make itching worse. A cold shower may help.  Try taking a bath with: ? Epsom salts. You can get these at your local pharmacy or grocery store. Follow the instructions on the package. ? Baking soda. Pour a small amount into the bath as told by your doctor. ? Colloidal oatmeal. You can get this at your local pharmacy or grocery store. Follow the instructions on the package.  Try putting baking soda paste onto your skin. Stir water into baking soda until it gets like a paste.  Try putting on a lotion that relieves itchiness (calamine lotion).  Keep cool and out of the sun. Sweating and being hot can make itching worse. General instructions  Rest as needed.  Drink enough fluid to keep your pee (urine) pale yellow.  Wear loose-fitting clothing.  Avoid scented soaps, detergents, and perfumes. Use gentle soaps, detergents, perfumes, and other cosmetic products.  Avoid anything that causes your rash. Keep a journal to help  track what causes your rash. Write down: ? What you eat. ? What cosmetic products you use. ? What you drink. ? What you wear. This includes jewelry.  Keep all follow-up visits as told by your doctor. This is important.   Contact a doctor if:  You sweat at night.  You lose weight.  You pee (urinate) more than normal.  You pee less than normal, or you notice that your pee is a darker color than normal.  You feel weak.  You throw up (vomit).  Your skin or the whites of your eyes look yellow (jaundice).  Your skin: ? Tingles. ? Is numb.  Your rash: ? Does not go away after a few days. ? Gets worse.  You are: ? More thirsty than normal. ? More tired than normal.  You have: ? New symptoms. ? Pain in your belly (abdomen). ? A fever. ? Watery poop (diarrhea). Get help right away if:  You have a fever and your symptoms suddenly get worse.  You start to feel mixed up (confused).  You have a very bad headache or a stiff neck.  You have very bad joint pains or stiffness.  You have jerky movements that you cannot control (seizure).  Your rash covers all or most of your body. The rash may or may not be painful.  You have blisters that: ? Are on top of the rash. ? Grow larger. ? Grow together. ? Are painful. ? Are inside your nose or mouth.  You have   a rash that: ? Looks like purple pinprick-sized spots all over your body. ? Has a "bull's eye" or looks like a target. ? Is red and painful, causes your skin to peel, and is not from being in the sun too long. Summary  A rash is a change in the color of your skin. A rash can also change the way your skin feels.  The goal of treatment is to stop the itching and keep the rash from spreading.  Take or apply over-the-counter and prescription medicines only as told by your doctor.  Contact a doctor if you have new symptoms or symptoms that get worse.  Keep all follow-up visits as told by your doctor. This is  important. This information is not intended to replace advice given to you by your health care provider. Make sure you discuss any questions you have with your health care provider. Document Revised: 05/04/2018 Document Reviewed: 08/14/2017 Elsevier Patient Education  2021 [place]. Skin Tear A skin tear is a wound in which the top layers of skin peel off. This is a common problem for older people. It can also be a problem for people who take certain medicines for too long. To repair the skin, your doctor may use:  Tape.  Skin tape (adhesive) strips. A bandage (dressing) may also be placed over the tape or skin tape strips. Follow these instructions at home: Keep your wound clean  Clean the wound as told by your doctor. You may be told to keep the wound dry for the first few days. If you are told to clean the wound: ? Wash the wound with mild soap and water, a wound cleanser, or a salt-water (saline) solution. ? If you use soap, rinse the wound with water to get all the soap off. ? Do not rub the wound dry. Pat the wound gently with a clean towel or let it air-dry.  Change any bandage as told by your doctor. This includes changing the bandage if it gets wet, gets dirty, or starts to smell bad. To change your bandage: ? Wash your hands with soap and water for at least 20 seconds before and after you change your bandage. If you cannot use soap and water, use hand sanitizer. ? Leave tape or skin tape strips alone unless you are told to take them off. You may trim the edges of the tape strips if they curl up. Watch for signs of infection Check your wound every day for signs of infection. Check for:  Redness, swelling, or pain.  More fluid or blood.  Warmth.  Pus or a bad smell.   Protect your wound  Do not scratch or pick at the wound.  Protect the injured area until it has healed. Medicines  Take or apply over-the-counter and prescription medicines only as told by your  doctor.  If you were prescribed an antibiotic medicine, take or apply it as told by your doctor. Do not stop using it even if your condition gets better. General instructions  Keep the bandage dry.  Do not take baths, swim, use a hot tub, or do anything that puts your wound underwater. Ask your doctor about taking showers or sponge baths.  Keep all follow-up visits.   Contact a doctor if:  You have any of these signs of infection in your wound: ? Redness, swelling, or pain. ? More fluid or blood. ? Warmth. ? Pus or a bad smell. Get help right away if:  You have a  red streak that goes away from the skin tear.  You have a fever and chills, and your symptoms get worse all of a sudden. Summary  A skin tear is a wound in which the top layers of skin peel off.  To repair the skin, your doctor may use tape or skin tape strips.  Change any bandage as told by your doctor.  Take or apply over-the-counter and prescription medicines only as told by your doctor.  Contact a doctor if you have signs of infection. This information is not intended to replace advice given to you by your health care provider. Make sure you discuss any questions you have with your health care provider. Document Revised: 04/17/2019 Document Reviewed: 04/17/2019 Elsevier Patient Education  [place].

## 2020-04-06 NOTE — Assessment & Plan Note (Signed)
Symptoms not well controlled.  Advised patient to keep skin clean and dry, apply hydrocortisone cream, miconazole powder ordered, follow-up with worsening symptoms medication sent to pharmacy education printed out to patient.

## 2020-04-14 ENCOUNTER — Ambulatory Visit (INDEPENDENT_AMBULATORY_CARE_PROVIDER_SITE_OTHER): Payer: No Typology Code available for payment source

## 2020-04-14 ENCOUNTER — Encounter (INDEPENDENT_AMBULATORY_CARE_PROVIDER_SITE_OTHER): Payer: Self-pay | Admitting: Ophthalmology

## 2020-04-14 ENCOUNTER — Ambulatory Visit (INDEPENDENT_AMBULATORY_CARE_PROVIDER_SITE_OTHER): Payer: Medicare Other | Admitting: Ophthalmology

## 2020-04-14 ENCOUNTER — Encounter (INDEPENDENT_AMBULATORY_CARE_PROVIDER_SITE_OTHER): Payer: Medicare Other | Admitting: Ophthalmology

## 2020-04-14 ENCOUNTER — Other Ambulatory Visit: Payer: Self-pay

## 2020-04-14 DIAGNOSIS — I442 Atrioventricular block, complete: Secondary | ICD-10-CM

## 2020-04-14 DIAGNOSIS — E113592 Type 2 diabetes mellitus with proliferative diabetic retinopathy without macular edema, left eye: Secondary | ICD-10-CM | POA: Diagnosis not present

## 2020-04-14 DIAGNOSIS — E113493 Type 2 diabetes mellitus with severe nonproliferative diabetic retinopathy without macular edema, bilateral: Secondary | ICD-10-CM

## 2020-04-14 DIAGNOSIS — H353211 Exudative age-related macular degeneration, right eye, with active choroidal neovascularization: Secondary | ICD-10-CM | POA: Insufficient documentation

## 2020-04-14 LAB — CUP PACEART REMOTE DEVICE CHECK
Battery Remaining Longevity: 88 mo
Battery Voltage: 2.99 V
Brady Statistic AP VP Percent: 6.06 %
Brady Statistic AP VS Percent: 0 %
Brady Statistic AS VP Percent: 93.82 %
Brady Statistic AS VS Percent: 0.12 %
Brady Statistic RA Percent Paced: 6.05 %
Brady Statistic RV Percent Paced: 99.88 %
Date Time Interrogation Session: 20220322030004
Implantable Lead Implant Date: 20180521
Implantable Lead Implant Date: 20180521
Implantable Lead Location: 753859
Implantable Lead Location: 753859
Implantable Lead Model: 3830
Implantable Lead Model: 5076
Implantable Pulse Generator Implant Date: 20180521
Lead Channel Impedance Value: 285 Ohm
Lead Channel Impedance Value: 304 Ohm
Lead Channel Impedance Value: 342 Ohm
Lead Channel Impedance Value: 380 Ohm
Lead Channel Pacing Threshold Amplitude: 0.75 V
Lead Channel Pacing Threshold Amplitude: 1 V
Lead Channel Pacing Threshold Pulse Width: 0.4 ms
Lead Channel Pacing Threshold Pulse Width: 0.4 ms
Lead Channel Sensing Intrinsic Amplitude: 1 mV
Lead Channel Sensing Intrinsic Amplitude: 1 mV
Lead Channel Sensing Intrinsic Amplitude: 19.625 mV
Lead Channel Sensing Intrinsic Amplitude: 19.625 mV
Lead Channel Setting Pacing Amplitude: 1.5 V
Lead Channel Setting Pacing Amplitude: 2.5 V
Lead Channel Setting Pacing Pulse Width: 0.4 ms
Lead Channel Setting Sensing Sensitivity: 2 mV

## 2020-04-14 NOTE — Assessment & Plan Note (Signed)
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

## 2020-04-14 NOTE — Assessment & Plan Note (Signed)
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

## 2020-04-14 NOTE — Progress Notes (Signed)
04/14/2020     CHIEF COMPLAINT Patient presents for Eye Pain (Pt c/o pain in OD. Pt states he had a fall last week and hit OD. Pt states vision is also blurry in OD. /BGL: 180)   HISTORY OF PRESENT ILLNESS: [name] is a 75 y.o. male who presents to the clinic today for:   HPI    Eye Pain      In right eye.  Characterized as aching and burning.  Pain was noted as 2/10.  It is worse throughout the day.  Duration of 1 week.  Since onset it is stable.  Associated symptoms include blurred vision.  Treatments tried include no treatments.  Response to treatment was no improvement.  I, the attending physician,  performed the HPI with the patient and updated documentation appropriately. Additional comments: Pt c/o pain in OD. Pt states he had a fall last week and hit OD. Pt states vision is also blurry in OD.  BGL: 180       Last edited by Tilda Franco on 04/14/2020 10:05 AM. (History)      Referring physician: Dettinger, Fransisca Kaufmann, MD [place],  [place] [address]  HISTORICAL INFORMATION:   Selected notes from the MEDICAL RECORD NUMBER    Lab Results  Component Value Date   HGBA1C 8.1 (H) 02/17/2020     CURRENT MEDICATIONS: No current outpatient medications on file. (Ophthalmic Drugs)   No current facility-administered medications for this visit. (Ophthalmic Drugs)   Current Outpatient Medications (Other)  Medication Sig  . acetaminophen (TYLENOL) 500 MG tablet Take 1,000 mg by mouth every 6 (six) hours as needed (pain).  Marland Kitchen apixaban (ELIQUIS) 5 MG TABS tablet Take 1 tablet (5 mg total) by mouth 2 (two) times daily.  . cephALEXin (KEFLEX) 500 MG capsule Take 1 capsule (500 mg total) by mouth 2 (two) times daily.  . cholecalciferol (VITAMIN D) 1000 units tablet Take 1,000 Units by mouth daily.   . furosemide (LASIX) 20 MG tablet Take 1 tablet (20 mg total) by mouth daily.  Marland Kitchen gabapentin (NEURONTIN) 300 MG capsule Take 1 capsule (300 mg total) by mouth 2 (two)  times daily.  Marland Kitchen glucose blood test strip 1 strip by Does not apply route 3 (three) times daily. Uses true metrix strips (diabetic club)  . hydrocortisone cream 1 % Apply 1 application topically 2 (two) times daily.  . insulin detemir (LEVEMIR FLEXTOUCH) 100 UNIT/ML FlexPen Inject 35-40 Units into the skin 2 (two) times daily. (Patient taking differently: Inject 30 Units into the skin 2 (two) times daily.)  . levothyroxine (SYNTHROID) 175 MCG tablet Take 1 tablet (175 mcg total) by mouth daily before breakfast.  . lisinopril (ZESTRIL) 5 MG tablet Take 5 mg by mouth daily.  Marland Kitchen loperamide (IMODIUM A-D) 2 MG tablet Take 1 tablet (2 mg total) by mouth 4 (four) times daily as needed for diarrhea or loose stools.  . metFORMIN (GLUCOPHAGE) 1000 MG tablet TAKE 1 TABLET BY MOUTH TWICE DAILY WITH A MEAL. (Patient taking differently: Take 1,000 mg by mouth in the morning and at bedtime.)  . miconazole nitrate (MICATIN) POWD Apply 1 application topically 2 (two) times daily.  . Multiple Vitamins-Minerals (MENS MULTI VITAMIN & MINERAL PO) Take 1 tablet by mouth daily.    . Semaglutide, 1 MG/DOSE, (OZEMPIC, 1 MG/DOSE,) 2 MG/1.5ML SOPN Inject 1 mg into the skin once a week.  . simvastatin (ZOCOR) 80 MG tablet Take 0.5 tablets (40 mg total)  by mouth at bedtime.  . tamsulosin (FLOMAX) 0.4 MG CAPS capsule TAKE TWO CAPSULES BY MOUTH AT BEDTIME  . traMADol (ULTRAM) 50 MG tablet Take 50 mg by mouth 2 (two) times daily.   No current facility-administered medications for this visit. (Other)      REVIEW OF SYSTEMS:    ALLERGIES Allergies  Allergen Reactions  . Phenothiazines Anaphylaxis  . Actos [Pioglitazone] Swelling    PAST MEDICAL HISTORY Past Medical History:  Diagnosis Date  . Arthritis   . Atrial fibrillation ([place])   . BPH (benign prostatic hypertrophy) 04/15/2010  . Cataract   . Colon polyps   . DEPRESSION 01/22/2009  . Heart valve replaced   . HYPERCHOLESTEROLEMIA 01/22/2009  . HYPERTENSION  01/22/2009   Dr. [name]  . 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 Morbid obesity ([place])   . 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 Tremors of nervous system    ?ptsd  . Ulcer   . Vitreous hemorrhage of left eye ([place]) 08/27/2019   Past Surgical History:  Procedure Laterality Date  . 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 APPENDECTOMY    . bilateral cataract surg    . COLONOSCOPY  04/24/2007   [name]: normal  . COLONOSCOPY WITH ESOPHAGOGASTRODUODENOSCOPY (EGD) N/A 04/05/2012   Procedure: COLONOSCOPY WITH ESOPHAGOGASTRODUODENOSCOPY (EGD);  Surgeon: Daneil Dolin, MD;  Location: [facility];  Service: Endoscopy;  Laterality: N/A;  10;15  . DENTAL SURGERY  05/2004   Dental extractions  . EYE SURGERY Bilateral    cataract  . HEEL SPUR SURGERY Bilateral    resection of heel spur  . KNEE ARTHROSCOPY WITH LATERAL MENISECTOMY Right 04/04/2014   Procedure: KNEE ARTHROSCOPY WITH LATERAL MENISECTOMY;  Surgeon: Carole Civil, MD;  Location: [facility];  Service: Orthopedics;  Laterality: Right;  . PACEMAKER IMPLANT N/A 06/13/2016   Procedure: Pacemaker Implant- Dual Chamber;  Surgeon: Evans Lance, MD;  Location: [facility];  Service: Cardiovascular;  Laterality: N/A;  . PACEMAKER INSERTION  2018  . THYROIDECTOMY  03/21/2013   DR [name]  . THYROIDECTOMY N/A 03/21/2013   Procedure: THYROIDECTOMY;  Surgeon: [name], MD;  Location: [facility];  Service: General;  Laterality: N/A;  . TONSILLECTOMY      FAMILY HISTORY Family History  Problem Relation Age of Onset  . Heart disease Father   . Congestive Heart Failure Father   . Arthritis Father   . Diabetes Other   . Benign prostatic hyperplasia Brother     SOCIAL HISTORY Social History   Tobacco Use  . Smoking status: Former Smoker    Packs/day: 0.00    Years: 12.00    Pack years: 0.00    Types: Cigarettes    Quit date:  09/20/1961    Years since quitting: 58.6  . Smokeless tobacco: Never Used  Vaping Use  . Vaping Use: Never used  Substance Use Topics  . Alcohol use: Not Currently  . Drug use: No         OPHTHALMIC EXAM: Base Eye Exam    Visual Acuity (ETDRS)      Right Left   Dist cc 20/70 20/25 -2   Dist ph cc 20/60 -2    Correction: Glasses       Tonometry (Tonopen, 10:10 AM)      Right Left   Pressure 11 11       Pupils  Pupils Dark Light Shape React APD   Right PERRL 4 4 Round Minimal None   Left PERRL 4 4 Round Minimal None       Neuro/Psych    Oriented x3: Yes   Mood/Affect: Normal       Dilation    Right eye: 1.0% Mydriacyl, 2.5% Phenylephrine @ 10:10 AM        Slit Lamp and Fundus Exam    External Exam      Right Left   External Normal Normal       Slit Lamp Exam      Right Left   Lids/Lashes Normal Normal   Conjunctiva/Sclera White and quiet White and quiet   Cornea Clear Clear   Anterior Chamber Deep and quiet Deep and quiet   [place]ris  patent PI sup Round and reactive   Lens Centered posterior chamber intraocular lens, Open posterior capsule Centered posterior chamber intraocular lens   Anterior Vitreous Normal Normal       Fundus Exam      Right Left   Posterior Vitreous Posterior vitreous detachment    Disc Normal    C/D Ratio 0.1    Macula Normal    Vessels NPDR-Severe    Periphery moderate scatter pattern prp            IMAGING AND PROCEDURES  Imaging and Procedures for 04/14/20  OCT, Retina - OU - Both Eyes       Right Eye Quality was good. Scan locations included subfoveal. Central Foveal Thickness: 304. Progression has worsened. Findings include subretinal fluid, abnormal foveal contour.   Left Eye Quality was good. Scan locations included subfoveal. Central Foveal Thickness: 278. Progression has been stable. Findings include retinal drusen , abnormal foveal contour.   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 Exudative age-related macular degeneration of right eye with active choroidal neovascularization ([place])  H35.3211   3. 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  Dilate OU next, Optos FFA transit right eye, left eye for likely wet ARMD OD as well as ongoing NPDR or PDR OU  2.  Likely injection intravitreal Avastin OD next  3.  Ophthalmic Meds Ordered this visit:  No orders of the defined types were placed in this encounter.      Return in about 1 week (around 04/21/2020) for DILATE OU, OPTOS FFA R/L, COLOR FP, AVASTIN OCT, OD.  There are no Patient Instructions on file for this visit.   Explained  the diagnoses, plan, and follow up with the patient and they expressed understanding.  Patient expressed understanding of the importance of proper follow up care.   Clent Demark Rankin M.D. Diseases & Surgery of the Retina and Vitreous [facility] [place] 04/14/20     Abbreviations: M  myopia (nearsighted); A astigmatism; H hyperopia (farsighted); P presbyopia; Mrx spectacle prescription;  CTL contact lenses; OD right eye; OS left eye; OU both eyes  XT exotropia; ET esotropia; PEK punctate epithelial keratitis; PEE punctate epithelial erosions; DES dry eye syndrome; MGD meibomian gland dysfunction; ATs artificial tears; PFAT's preservative free artificial tears; [place] nuclear sclerotic cataract; PSC posterior subcapsular cataract; ERM epi-retinal membrane; PVD posterior vitreous detachment; RD retinal detachment; DM diabetes mellitus; DR diabetic retinopathy; NPDR non-proliferative diabetic retinopathy; PDR proliferative diabetic retinopathy; CSME clinically significant macular edema; DME diabetic macular edema; dbh dot blot hemorrhages; CWS cotton wool spot; POAG primary open angle glaucoma; C/D cup-to-disc ratio; HVF humphrey visual field; GVF goldmann visual field; OCT optical coherence tomography; IOP intraocular pressure; BRVO Branch retinal vein occlusion; CRVO central retinal vein occlusion; CRAO central retinal artery occlusion; BRAO branch retinal artery occlusion; RT retinal tear; SB scleral buckle; PPV pars plana vitrectomy; VH Vitreous hemorrhage; PRP panretinal laser photocoagulation; IVK intravitreal kenalog; VMT vitreomacular traction; MH Macular hole;  NVD neovascularization of the disc; NVE neovascularization elsewhere; AREDS age related eye disease study; ARMD age related macular degeneration; POAG primary open angle glaucoma; EBMD epithelial/anterior basement membrane dystrophy; ACIOL anterior chamber intraocular lens; IOL intraocular lens; PCIOL posterior chamber intraocular lens; Phaco/IOL phacoemulsification with intraocular lens placement; [place] photorefractive keratectomy; LASIK laser assisted in situ keratomileusis; HTN hypertension; DM diabetes mellitus; COPD chronic obstructive pulmonary disease

## 2020-04-20 ENCOUNTER — Ambulatory Visit (INDEPENDENT_AMBULATORY_CARE_PROVIDER_SITE_OTHER): Payer: Medicare Other | Admitting: Ophthalmology

## 2020-04-20 ENCOUNTER — Other Ambulatory Visit: Payer: Self-pay

## 2020-04-20 ENCOUNTER — Encounter (INDEPENDENT_AMBULATORY_CARE_PROVIDER_SITE_OTHER): Payer: Self-pay | Admitting: Ophthalmology

## 2020-04-20 DIAGNOSIS — H353211 Exudative age-related macular degeneration, right eye, with active choroidal neovascularization: Secondary | ICD-10-CM | POA: Diagnosis not present

## 2020-04-20 DIAGNOSIS — E113493 Type 2 diabetes mellitus with severe nonproliferative diabetic retinopathy without macular edema, bilateral: Secondary | ICD-10-CM | POA: Diagnosis not present

## 2020-04-20 DIAGNOSIS — E113592 Type 2 diabetes mellitus with proliferative diabetic retinopathy without macular edema, left eye: Secondary | ICD-10-CM

## 2020-04-20 DIAGNOSIS — E113491 Type 2 diabetes mellitus with severe nonproliferative diabetic retinopathy without macular edema, right eye: Secondary | ICD-10-CM

## 2020-04-20 MED ORDER — BEVACIZUMAB 2.5 MG/0.1ML IZ SOSY
2.5000 mg | PREFILLED_SYRINGE | INTRAVITREAL | Status: AC | PRN
  Administered 2020-04-20: 2.5 mg via INTRAVITREAL

## 2020-04-20 MED ORDER — FLUORESCEIN SODIUM 10 % IV SOLN
500.0000 mg | INTRAVENOUS | Status: AC | PRN
  Administered 2020-04-20: 500 mg via INTRAVENOUS

## 2020-04-20 NOTE — Assessment & Plan Note (Signed)
Stable OT

## 2020-04-20 NOTE — Assessment & Plan Note (Addendum)
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  We will commence with therapy OD to resolve subretinal fluid as noted today and confirmed today on OCT and by FFA angiography

## 2020-04-20 NOTE — Progress Notes (Signed)
04/20/2020     CHIEF COMPLAINT Patient presents for Retina Follow Up (1 Week f\u OU. FFA R/L , FP. Possible Avastin OD and OCT/Pt states no changes since last visit./BGL: 145)   HISTORY OF PRESENT ILLNESS: [name] is a 75 y.o. male who presents to the clinic today for:   HPI    Retina Follow Up    Patient presents with  Diabetic Retinopathy.  In both eyes.  Severity is severe.  Duration of 1 week.  Since onset it is stable.  I, the attending physician,  performed the HPI with the patient and updated documentation appropriately. Additional comments: 1 Week f\u OU. FFA R/L , FP. Possible Avastin OD and OCT Pt states no changes since last visit. BGL: 145       Last edited by Tilda Franco on 04/20/2020  3:55 PM. (History)      Referring physician: Dettinger, Fransisca Kaufmann, MD [place],  [place] [id number]  HISTORICAL INFORMATION:   Selected notes from the MEDICAL RECORD NUMBER    Lab Results  Component Value Date   HGBA1C 8.1 (H) 02/17/2020     CURRENT MEDICATIONS: No current outpatient medications on file. (Ophthalmic Drugs)   No current facility-administered medications for this visit. (Ophthalmic Drugs)   Current Outpatient Medications (Other)  Medication Sig  . acetaminophen (TYLENOL) 500 MG tablet Take 1,000 mg by mouth every 6 (six) hours as needed (pain).  Marland Kitchen apixaban (ELIQUIS) 5 MG TABS tablet Take 1 tablet (5 mg total) by mouth 2 (two) times daily.  . cephALEXin (KEFLEX) 500 MG capsule Take 1 capsule (500 mg total) by mouth 2 (two) times daily.  . cholecalciferol (VITAMIN D) 1000 units tablet Take 1,000 Units by mouth daily.   . furosemide (LASIX) 20 MG tablet Take 1 tablet (20 mg total) by mouth daily.  Marland Kitchen gabapentin (NEURONTIN) 300 MG capsule Take 1 capsule (300 mg total) by mouth 2 (two) times daily.  Marland Kitchen glucose blood test strip 1 strip by Does not apply route 3 (three) times daily. Uses true metrix strips (diabetic club)  . hydrocortisone cream  1 % Apply 1 application topically 2 (two) times daily.  . insulin detemir (LEVEMIR FLEXTOUCH) 100 UNIT/ML FlexPen Inject 35-40 Units into the skin 2 (two) times daily. (Patient taking differently: Inject 30 Units into the skin 2 (two) times daily.)  . levothyroxine (SYNTHROID) 175 MCG tablet Take 1 tablet (175 mcg total) by mouth daily before breakfast.  . lisinopril (ZESTRIL) 5 MG tablet Take 5 mg by mouth daily.  Marland Kitchen loperamide (IMODIUM A-D) 2 MG tablet Take 1 tablet (2 mg total) by mouth 4 (four) times daily as needed for diarrhea or loose stools.  . metFORMIN (GLUCOPHAGE) 1000 MG tablet TAKE 1 TABLET BY MOUTH TWICE DAILY WITH A MEAL. (Patient taking differently: Take 1,000 mg by mouth in the morning and at bedtime.)  . miconazole nitrate (MICATIN) POWD Apply 1 application topically 2 (two) times daily.  . Multiple Vitamins-Minerals (MENS MULTI VITAMIN & MINERAL PO) Take 1 tablet by mouth daily.    . Semaglutide, 1 MG/DOSE, (OZEMPIC, 1 MG/DOSE,) 2 MG/1.5ML SOPN Inject 1 mg into the skin once a week.  . simvastatin (ZOCOR) 80 MG tablet Take 0.5 tablets (40 mg total) by mouth at bedtime.  . tamsulosin (FLOMAX) 0.4 MG CAPS capsule TAKE TWO CAPSULES BY MOUTH AT BEDTIME  . traMADol (ULTRAM) 50 MG tablet Take 50 mg by mouth 2 (two) times daily.  No current facility-administered medications for this visit. (Other)      REVIEW OF SYSTEMS: ROS    Positive for: Endocrine   Last edited by Tilda Franco on 04/20/2020  3:55 PM. (History)       ALLERGIES Allergies  Allergen Reactions  . Phenothiazines Anaphylaxis  . Actos [Pioglitazone] Swelling    PAST MEDICAL HISTORY Past Medical History:  Diagnosis Date  . Arthritis   . Atrial fibrillation ([place])   . BPH (benign prostatic hypertrophy) 04/15/2010  . Cataract   . Colon polyps   . DEPRESSION 01/22/2009  . Heart valve replaced   . HYPERCHOLESTEROLEMIA 01/22/2009  . HYPERTENSION 01/22/2009   Dr. [name]  . 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 Morbid obesity ([place])   . Severe nonproliferative diabetic retinopathy of both eyes ([place]) 08/27/2019  . Tremors of nervous system    ?ptsd  . Ulcer   . Vitreous hemorrhage of left eye ([place]) 08/27/2019   Past Surgical History:  Procedure Laterality Date  . 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 APPENDECTOMY    . bilateral cataract surg    . COLONOSCOPY  04/24/2007   [name]: normal  . COLONOSCOPY WITH ESOPHAGOGASTRODUODENOSCOPY (EGD) N/A 04/05/2012   Procedure: COLONOSCOPY WITH ESOPHAGOGASTRODUODENOSCOPY (EGD);  Surgeon: Daneil Dolin, MD;  Location: [facility];  Service: Endoscopy;  Laterality: N/A;  10;15  . DENTAL SURGERY  05/2004   Dental extractions  . EYE SURGERY Bilateral    cataract  . HEEL SPUR SURGERY Bilateral    resection of heel spur  . KNEE ARTHROSCOPY WITH LATERAL MENISECTOMY Right 04/04/2014   Procedure: KNEE ARTHROSCOPY WITH LATERAL MENISECTOMY;  Surgeon: Carole Civil, MD;  Location: [facility];  Service: Orthopedics;  Laterality: Right;  . PACEMAKER IMPLANT N/A 06/13/2016   Procedure: Pacemaker Implant- Dual Chamber;  Surgeon: Evans Lance, MD;  Location: [facility];  Service: Cardiovascular;  Laterality: N/A;  . PACEMAKER INSERTION  2018  . THYROIDECTOMY  03/21/2013   DR [name]  . THYROIDECTOMY N/A 03/21/2013   Procedure: THYROIDECTOMY;  Surgeon: [name], MD;  Location: [facility];  Service: General;  Laterality: N/A;  . TONSILLECTOMY      FAMILY HISTORY Family History  Problem Relation Age of Onset  . Heart disease Father   . Congestive Heart Failure Father   . Arthritis Father   . Diabetes Other   . Benign prostatic hyperplasia Brother     SOCIAL HISTORY Social History   Tobacco Use  . Smoking status: Former Smoker    Packs/day: 0.00    Years: 12.00    Pack years: 0.00    Types: Cigarettes    Quit date: 09/20/1961    Years since quitting: 58.6  .  Smokeless tobacco: Never Used  Vaping Use  . Vaping Use: Never used  Substance Use Topics  . Alcohol use: Not Currently  . Drug use: No         OPHTHALMIC EXAM:  Base Eye Exam    Visual Acuity (Snellen - Linear)      Right Left   Dist cc 20/50 -2 20/25 -2   Dist ph cc NI    Correction: Glasses       Tonometry (Tonopen, 3:59 PM)      Right Left   Pressure 9 11       Neuro/Psych    Oriented x3: Yes   Mood/Affect: Normal  Dilation    Both eyes: 1.0% Mydriacyl, 2.5% Phenylephrine @ 3:59 PM        Slit Lamp and Fundus Exam    External Exam      Right Left   External Normal Normal       Slit Lamp Exam      Right Left   Lids/Lashes Normal Normal   Conjunctiva/Sclera White and quiet White and quiet   Cornea Clear Clear   Anterior Chamber Deep and quiet Deep and quiet   Iris  patent PI sup Round and reactive   Lens Centered posterior chamber intraocular lens, Open posterior capsule Centered posterior chamber intraocular lens   Anterior Vitreous Normal Normal       Fundus Exam      Right Left   Posterior Vitreous Posterior vitreous detachment Normal   Disc Normal, no clinically detectable fluid Normal   C/D Ratio 0.1 0.1   Macula Normal Normal   Vessels NPDR-Severe NPDR-Severe   Periphery moderate scatter pattern prp  moderate scatter pattern prp           IMAGING AND PROCEDURES  Imaging and Procedures for 04/20/20  OCT, Retina - OU - Both Eyes       Right Eye Quality was good. Scan locations included subfoveal. Central Foveal Thickness: 302. Progression has worsened. Findings include subretinal fluid, abnormal foveal contour.   Left Eye Quality was good. Scan locations included subfoveal. Central Foveal Thickness: 275. Progression has been stable. Findings include retinal drusen , abnormal foveal contour.   Notes New subretinal fluid in a PERI papillary configuration likely represents para papillary CNVM which is not evident clinical  examination.  Only on OCT do we see this OD  OS no signs of active disease         Fluorescein Angiography Optos (Transit OD)       Injection:  500 mg Fluorescein Sodium 10 % injection   NDC: 207 [id number] 7240   Route: Intravenous, Site: Right ArmRight Eye   Early phase findings include window defect, leakage. Mid/Late phase findings include leakage, choroidal neovascularization. Choroidal neovascularization is peripapillary.   Left Eye Choroidal neovascularization is not present.   Notes OD with multifocal diffuse leakages in the perifoveal area suggestive of minor CME from diabetic retinopathy.  No overt CSME however.  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  OS good focal laser photocoagulation.  Both eyes with good PRP for severe NPDR, stable       Color Fundus Photography Optos - OU - Both Eyes       Right Eye Progression has no prior data. Macula : microaneurysms. Vessels : IRMA.   Left Eye Progression has no prior data. Disc findings include normal observations. Macula : microaneurysms. Vessels : IRMA.   Notes Moderate scatter PRP OU.  Good focal laser photocoagulation.       Intravitreal Injection, Pharmacologic Agent - OD - Right Eye       Time Out 04/20/2020. 4:28 PM. Confirmed correct patient, procedure, site, and patient consented.   Anesthesia Topical anesthesia was used. Anesthetic medications included [place] 3.5%.   Procedure Preparation included Ofloxacin , 10% betadine to eyelids, 5% betadine to ocular surface. A 30 gauge needle was used.   Injection:  2.5 mg Bevacizumab (AVASTIN) 2.5mg /0.22mL SOSY   NDC: 44034-742-59, Lot: [id number]   Route: Intravitreal, Site: Right Eye  Post-op Post injection exam found visual acuity of at least counting fingers. The patient tolerated the procedure well. There  were no complications. The patient received written and verbal post procedure care education. Post injection medications  were not given.                 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 Exudative age-related macular degeneration of right eye with active choroidal neovascularization (HCC)  H35.3211 Intravitreal Injection, Pharmacologic Agent - OD - Right Eye    bevacizumab (AVASTIN) SOSY 2.5 mg  2. Severe nonproliferative diabetic retinopathy of both eyes associated with type 2 diabetes mellitus, macular edema presence unspecified (HCC)  E11.3493 OCT, Retina - OU - Both Eyes    Fluorescein Angiography Optos (Transit OD)    Color Fundus Photography Optos - OU - Both Eyes    Fluorescein Sodium 10 % injection 500 mg  3. Severe nonproliferative diabetic retinopathy of right eye without macular edema associated with type 2 diabetes mellitus ([place])  E11.3491   4. 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  We will commence with intravitreal Avastin therapy OD today for PERI papillary CNVM, new onset with subretinal fluid see the OCT results  2.  3.  Ophthalmic Meds Ordered this visit:  Meds ordered this encounter  Medications  . Fluorescein Sodium 10 % injection 500 mg  . bevacizumab (AVASTIN) SOSY 2.5 mg       Return in about 6 weeks (around 06/01/2020) for dilate, OD, AVASTIN OCT.  There are no Patient Instructions on file for this visit.   Explained the diagnoses, plan, and  follow up with the patient and they expressed understanding.  Patient expressed understanding of the importance of proper follow up care.   Clent Demark Marcelia Petersen M.D. Diseases & Surgery of the Retina and Vitreous [facility]ina [facility] Diabetic [place] 04/20/20     Abbreviations: M myopia (nearsighted); A astigmatism; H hyperopia (farsighted); P presbyopia; Mrx spectacle prescription;  CTL contact lenses; OD right eye; OS left eye; OU both eyes  XT exotropia; ET esotropia; PEK punctate epithelial keratitis; PEE punctate epithelial erosions; DES dry eye syndrome; MGD meibomian gland dysfunction; ATs artificial tears; PFAT's preservative free artificial tears; [place] nuclear sclerotic cataract; PSC posterior subcapsular cataract; ERM epi-retinal membrane; PVD posterior vitreous detachment; RD retinal detachment; DM diabetes mellitus; DR diabetic retinopathy; NPDR non-proliferative diabetic retinopathy; PDR proliferative diabetic retinopathy; CSME clinically significant macular edema; DME diabetic macular edema; dbh dot blot hemorrhages; CWS cotton wool spot; POAG primary open angle glaucoma; C/D cup-to-disc ratio; HVF humphrey visual field; GVF goldmann visual field; OCT optical coherence tomography; IOP intraocular pressure; BRVO Branch retinal vein occlusion; CRVO central retinal vein occlusion; CRAO central retinal artery occlusion; BRAO branch retinal artery occlusion; RT retinal tear; SB scleral buckle; PPV pars plana vitrectomy; VH Vitreous hemorrhage; PRP panretinal laser photocoagulation; IVK intravitreal kenalog; VMT vitreomacular traction; MH Macular hole;  NVD neovascularization of the disc; NVE neovascularization elsewhere; AREDS age related eye disease study; ARMD age related macular degeneration; POAG primary open angle  glaucoma; EBMD epithelial/anterior basement membrane dystrophy; ACIOL anterior chamber intraocular lens; IOL intraocular lens; PCIOL posterior chamber intraocular lens; Phaco/IOL  phacoemulsification with intraocular lens placement; [place] photorefractive keratectomy; LASIK laser assisted in situ keratomileusis; HTN hypertension; DM diabetes mellitus; COPD chronic obstructive pulmonary disease

## 2020-04-20 NOTE — Assessment & Plan Note (Signed)
Stable OS 

## 2020-04-22 ENCOUNTER — Encounter (INDEPENDENT_AMBULATORY_CARE_PROVIDER_SITE_OTHER): Payer: No Typology Code available for payment source | Admitting: Ophthalmology

## 2020-04-22 NOTE — Progress Notes (Signed)
Remote pacemaker transmission.   

## 2020-04-23 ENCOUNTER — Telehealth: Payer: Self-pay

## 2020-04-23 ENCOUNTER — Ambulatory Visit (INDEPENDENT_AMBULATORY_CARE_PROVIDER_SITE_OTHER): Payer: Medicare Other | Admitting: Family Medicine

## 2020-04-23 ENCOUNTER — Other Ambulatory Visit: Payer: Self-pay

## 2020-04-23 ENCOUNTER — Encounter: Payer: Self-pay | Admitting: Family Medicine

## 2020-04-23 VITALS — BP 129/70 | HR 76 | Ht 73.0 in

## 2020-04-23 DIAGNOSIS — S80212A Abrasion, left knee, initial encounter: Secondary | ICD-10-CM | POA: Diagnosis not present

## 2020-04-23 DIAGNOSIS — T148XXA Other injury of unspecified body region, initial encounter: Secondary | ICD-10-CM

## 2020-04-23 DIAGNOSIS — S80211A Abrasion, right knee, initial encounter: Secondary | ICD-10-CM

## 2020-04-23 MED ORDER — MUPIROCIN CALCIUM 2 % EX CREA: 1.0000 "application " | TOPICAL_CREAM | Freq: Two times a day (BID) | CUTANEOUS | 1 refills | Status: DC

## 2020-04-23 MED ORDER — MUPIROCIN 2 % EX OINT: 1.0000 "application " | TOPICAL_OINTMENT | Freq: Two times a day (BID) | CUTANEOUS | 1 refills | Status: DC

## 2020-04-23 NOTE — Telephone Encounter (Signed)
complete

## 2020-04-23 NOTE — Progress Notes (Signed)
   BP 129/70   Pulse 76   Ht 6\' 1"  (1.854 m)   SpO2 98%   BMI 32.06 kg/m    Subjective:   Patient ID: [name], male    DOB: [birth year], 75 y.o.   MRN: [id number]  HPI: [name] is a 75 y.o. male presenting on 04/23/2020 for skin tears (Bilateral knees. One on chest as well. Happened after sugar dropped too low and passed out.)   HPI Patient had skin tears/abrasions from fall that he sustained just over a month ago.  He was seen a couple weeks ago by one of our providers.  He says he had a fall because of low blood sugars and says his blood sugar went down to 45.  He has been using hydrocortisone and an antifungal cream on it.  He denies any fevers or chills but does does have a little bit of redness around it.  He denies any drainage or purulence and it is still scabbed over.  The 2 lesions on his knees are almost completely healed.  He says that he has had no further falls and no further hypoglycemic episodes.  Relevant past medical, surgical, family and social history reviewed and updated as indicated. Interim medical history since our last visit reviewed. Allergies and medications reviewed and updated.  Review of Systems  Constitutional: Negative for activity change and appetite change.  Skin: Positive for color change and wound.    Per HPI unless specifically indicated above      Objective:   BP 129/70   Pulse 76   Ht 6\' 1"  (1.854 m)   SpO2 98%   BMI 32.06 kg/m   Wt Readings from Last 3 Encounters:  04/01/20 243 lb (110.2 kg)  03/19/20 243 lb (110.2 kg)  03/13/20 244 lb 6.4 oz (110.9 kg)    Physical Exam Vitals and nursing note reviewed.  Constitutional:      Appearance: Normal appearance.  Skin:    Findings: Erythema and wound present.          Comments: Abrasion on both of his knees, almost completely healed no erythema.  Neurological:     Mental Status: He is alert.       Assessment & Plan:   Problem List Items Addressed This Visit    None   Visit Diagnoses    Skin abrasion    -  Primary   Relevant Medications   mupirocin cream (BACTROBAN) 2 %      Recommended to switch to using mupirocin cream to prevent any infection, stop the hydrocortisone, may do the antifungal cream if he wants to. Follow up plan: Return in about 4 weeks (around 05/21/2020), or if symptoms worsen or fail to improve, for diabetes and htn.  Counseling provided for all of the vaccine components No orders of the defined types were placed in this encounter.   [name] Pina, MD [facility] 04/23/2020, 11:46 AM

## 2020-04-23 NOTE — Telephone Encounter (Signed)
I sent the medication as an ointment, hopefully that is cheaper.

## 2020-04-28 ENCOUNTER — Encounter: Payer: Self-pay | Admitting: Emergency Medicine

## 2020-04-28 ENCOUNTER — Inpatient Hospital Stay (HOSPITAL_COMMUNITY)
Admission: EM | Admit: 2020-04-28 | Discharge: 2020-05-02 | DRG: 312 | Disposition: A | Discharge status: Home / Self Care | Payer: No Typology Code available for payment source | Attending: Internal Medicine | Admitting: Internal Medicine

## 2020-04-28 ENCOUNTER — Other Ambulatory Visit: Payer: Self-pay

## 2020-04-28 ENCOUNTER — Emergency Department (HOSPITAL_COMMUNITY): Payer: No Typology Code available for payment source

## 2020-04-28 ENCOUNTER — Ambulatory Visit
Admission: EM | Admit: 2020-04-28 | Discharge: 2020-04-28 | Disposition: A | Discharge status: Home / Self Care | Payer: Medicare Other | Attending: Internal Medicine | Admitting: Internal Medicine

## 2020-04-28 ENCOUNTER — Encounter (HOSPITAL_COMMUNITY): Payer: Self-pay | Admitting: Emergency Medicine

## 2020-04-28 DIAGNOSIS — Z7901 Long term (current) use of anticoagulants: Secondary | ICD-10-CM | POA: Diagnosis not present

## 2020-04-28 DIAGNOSIS — E113592 Type 2 diabetes mellitus with proliferative diabetic retinopathy without macular edema, left eye: Secondary | ICD-10-CM

## 2020-04-28 DIAGNOSIS — N1832 Chronic kidney disease, stage 3b: Secondary | ICD-10-CM | POA: Diagnosis present

## 2020-04-28 DIAGNOSIS — Z952 Presence of prosthetic heart valve: Secondary | ICD-10-CM

## 2020-04-28 DIAGNOSIS — N3000 Acute cystitis without hematuria: Secondary | ICD-10-CM | POA: Diagnosis not present

## 2020-04-28 DIAGNOSIS — D696 Thrombocytopenia, unspecified: Secondary | ICD-10-CM | POA: Diagnosis present

## 2020-04-28 DIAGNOSIS — Z87891 Personal history of nicotine dependence: Secondary | ICD-10-CM

## 2020-04-28 DIAGNOSIS — N390 Urinary tract infection, site not specified: Secondary | ICD-10-CM | POA: Diagnosis present

## 2020-04-28 DIAGNOSIS — Z6830 Body mass index (BMI) 30.0-30.9, adult: Secondary | ICD-10-CM

## 2020-04-28 DIAGNOSIS — N400 Enlarged prostate without lower urinary tract symptoms: Secondary | ICD-10-CM | POA: Diagnosis present

## 2020-04-28 DIAGNOSIS — E890 Postprocedural hypothyroidism: Secondary | ICD-10-CM | POA: Diagnosis present

## 2020-04-28 DIAGNOSIS — Z950 Presence of cardiac pacemaker: Secondary | ICD-10-CM

## 2020-04-28 DIAGNOSIS — Z794 Long term (current) use of insulin: Secondary | ICD-10-CM

## 2020-04-28 DIAGNOSIS — Z833 Family history of diabetes mellitus: Secondary | ICD-10-CM

## 2020-04-28 DIAGNOSIS — I130 Hypertensive heart and chronic kidney disease with heart failure and stage 1 through stage 4 chronic kidney disease, or unspecified chronic kidney disease: Secondary | ICD-10-CM | POA: Diagnosis present

## 2020-04-28 DIAGNOSIS — Z79899 Other long term (current) drug therapy: Secondary | ICD-10-CM | POA: Diagnosis not present

## 2020-04-28 DIAGNOSIS — D509 Iron deficiency anemia, unspecified: Secondary | ICD-10-CM | POA: Diagnosis present

## 2020-04-28 DIAGNOSIS — D649 Anemia, unspecified: Secondary | ICD-10-CM | POA: Diagnosis not present

## 2020-04-28 DIAGNOSIS — I5032 Chronic diastolic (congestive) heart failure: Secondary | ICD-10-CM | POA: Diagnosis present

## 2020-04-28 DIAGNOSIS — Z8249 Family history of ischemic heart disease and other diseases of the circulatory system: Secondary | ICD-10-CM

## 2020-04-28 DIAGNOSIS — E7800 Pure hypercholesterolemia, unspecified: Secondary | ICD-10-CM | POA: Diagnosis present

## 2020-04-28 DIAGNOSIS — E1122 Type 2 diabetes mellitus with diabetic chronic kidney disease: Secondary | ICD-10-CM | POA: Diagnosis present

## 2020-04-28 DIAGNOSIS — I359 Nonrheumatic aortic valve disorder, unspecified: Secondary | ICD-10-CM | POA: Diagnosis present

## 2020-04-28 DIAGNOSIS — K219 Gastro-esophageal reflux disease without esophagitis: Secondary | ICD-10-CM | POA: Diagnosis present

## 2020-04-28 DIAGNOSIS — R011 Cardiac murmur, unspecified: Secondary | ICD-10-CM | POA: Diagnosis present

## 2020-04-28 DIAGNOSIS — E538 Deficiency of other specified B group vitamins: Secondary | ICD-10-CM | POA: Diagnosis present

## 2020-04-28 DIAGNOSIS — N179 Acute kidney failure, unspecified: Secondary | ICD-10-CM | POA: Diagnosis present

## 2020-04-28 DIAGNOSIS — J449 Chronic obstructive pulmonary disease, unspecified: Secondary | ICD-10-CM | POA: Diagnosis present

## 2020-04-28 DIAGNOSIS — E11649 Type 2 diabetes mellitus with hypoglycemia without coma: Secondary | ICD-10-CM | POA: Diagnosis present

## 2020-04-28 DIAGNOSIS — B961 Klebsiella pneumoniae [K. pneumoniae] as the cause of diseases classified elsewhere: Secondary | ICD-10-CM | POA: Diagnosis present

## 2020-04-28 DIAGNOSIS — E1140 Type 2 diabetes mellitus with diabetic neuropathy, unspecified: Secondary | ICD-10-CM | POA: Diagnosis present

## 2020-04-28 DIAGNOSIS — K2950 Unspecified chronic gastritis without bleeding: Secondary | ICD-10-CM | POA: Diagnosis not present

## 2020-04-28 DIAGNOSIS — I482 Chronic atrial fibrillation, unspecified: Secondary | ICD-10-CM | POA: Diagnosis present

## 2020-04-28 DIAGNOSIS — R531 Weakness: Secondary | ICD-10-CM | POA: Diagnosis not present

## 2020-04-28 DIAGNOSIS — Z20822 Contact with and (suspected) exposure to covid-19: Secondary | ICD-10-CM | POA: Diagnosis present

## 2020-04-28 DIAGNOSIS — D539 Nutritional anemia, unspecified: Secondary | ICD-10-CM | POA: Diagnosis present

## 2020-04-28 DIAGNOSIS — E860 Dehydration: Secondary | ICD-10-CM | POA: Diagnosis present

## 2020-04-28 DIAGNOSIS — K648 Other hemorrhoids: Secondary | ICD-10-CM | POA: Diagnosis present

## 2020-04-28 DIAGNOSIS — I4891 Unspecified atrial fibrillation: Secondary | ICD-10-CM | POA: Diagnosis present

## 2020-04-28 DIAGNOSIS — I2510 Atherosclerotic heart disease of native coronary artery without angina pectoris: Secondary | ICD-10-CM | POA: Diagnosis present

## 2020-04-28 DIAGNOSIS — Z1612 Extended spectrum beta lactamase (ESBL) resistance: Secondary | ICD-10-CM | POA: Diagnosis present

## 2020-04-28 DIAGNOSIS — R1032 Left lower quadrant pain: Secondary | ICD-10-CM | POA: Diagnosis not present

## 2020-04-28 DIAGNOSIS — Z79890 Hormone replacement therapy (postmenopausal): Secondary | ICD-10-CM

## 2020-04-28 DIAGNOSIS — E113493 Type 2 diabetes mellitus with severe nonproliferative diabetic retinopathy without macular edema, bilateral: Secondary | ICD-10-CM | POA: Diagnosis present

## 2020-04-28 DIAGNOSIS — K2970 Gastritis, unspecified, without bleeding: Secondary | ICD-10-CM | POA: Diagnosis not present

## 2020-04-28 DIAGNOSIS — Z7984 Long term (current) use of oral hypoglycemic drugs: Secondary | ICD-10-CM

## 2020-04-28 DIAGNOSIS — I951 Orthostatic hypotension: Secondary | ICD-10-CM | POA: Diagnosis present

## 2020-04-28 DIAGNOSIS — I10 Essential (primary) hypertension: Secondary | ICD-10-CM | POA: Diagnosis present

## 2020-04-28 DIAGNOSIS — I509 Heart failure, unspecified: Secondary | ICD-10-CM

## 2020-04-28 LAB — COMPREHENSIVE METABOLIC PANEL
ALT: 10 U/L (ref 0–44)
AST: 18 U/L (ref 15–41)
Albumin: 2.9 g/dL — ABNORMAL LOW (ref 3.5–5.0)
Alkaline Phosphatase: 61 U/L (ref 38–126)
Anion gap: 13 (ref 5–15)
BUN: 22 mg/dL (ref 8–23)
CO2: 19 mmol/L — ABNORMAL LOW (ref 22–32)
Calcium: 8.3 mg/dL — ABNORMAL LOW (ref 8.9–10.3)
Chloride: 103 mmol/L (ref 98–111)
Creatinine, Ser: 1.44 mg/dL — ABNORMAL HIGH (ref 0.61–1.24)
GFR, Estimated: 50 mL/min — ABNORMAL LOW (ref >60)
Glucose, Bld: 242 mg/dL — ABNORMAL HIGH (ref 70–99)
Potassium: 4.1 mmol/L (ref 3.5–5.1)
Sodium: 135 mmol/L (ref 135–145)
Total Bilirubin: 0.6 mg/dL (ref 0.3–1.2)
Total Protein: 6.6 g/dL (ref 6.5–8.1)

## 2020-04-28 LAB — MAGNESIUM: Magnesium: 1.5 mg/dL — ABNORMAL LOW (ref 1.7–2.4)

## 2020-04-28 LAB — CBC WITH DIFFERENTIAL/PLATELET
Abs Immature Granulocytes: 0.12 10*3/uL — ABNORMAL HIGH (ref 0.00–0.07)
Basophils Absolute: 0 10*3/uL (ref 0.0–0.1)
Basophils Relative: 1 %
Eosinophils Absolute: 0.2 10*3/uL (ref 0.0–0.5)
Eosinophils Relative: 2 %
HCT: 29.2 % — ABNORMAL LOW (ref 39.0–52.0)
Hemoglobin: 9.4 g/dL — ABNORMAL LOW (ref 13.0–17.0)
Immature Granulocytes: 1 %
Lymphocytes Relative: 14 %
Lymphs Abs: 1.2 10*3/uL (ref 0.7–4.0)
MCH: 32.8 pg (ref 26.0–34.0)
MCHC: 32.2 g/dL (ref 30.0–36.0)
MCV: 101.7 fL — ABNORMAL HIGH (ref 80.0–100.0)
Monocytes Absolute: 0.8 10*3/uL (ref 0.1–1.0)
Monocytes Relative: 9 %
Neutro Abs: 6.5 10*3/uL (ref 1.7–7.7)
Neutrophils Relative %: 73 %
Platelets: 151 10*3/uL (ref 150–400)
RBC: 2.87 MIL/uL — ABNORMAL LOW (ref 4.22–5.81)
RDW: 17.3 % — ABNORMAL HIGH (ref 11.5–15.5)
WBC: 8.8 10*3/uL (ref 4.0–10.5)
nRBC: 0 % (ref 0.0–0.2)

## 2020-04-28 LAB — LIPASE, BLOOD: Lipase: 24 U/L (ref 11–51)

## 2020-04-28 LAB — HEMOGLOBIN AND HEMATOCRIT, BLOOD
HCT: 27.7 % — ABNORMAL LOW (ref 39.0–52.0)
Hemoglobin: 9 g/dL — ABNORMAL LOW (ref 13.0–17.0)

## 2020-04-28 LAB — URINALYSIS, ROUTINE W REFLEX MICROSCOPIC
Bilirubin Urine: NEGATIVE
Glucose, UA: 50 mg/dL — AB
Ketones, ur: NEGATIVE mg/dL
Nitrite: NEGATIVE
Protein, ur: 30 mg/dL — AB
Specific Gravity, Urine: 1.014 (ref 1.005–1.030)
WBC, UA: >50 WBC/hpf — ABNORMAL HIGH (ref 0–5)
pH: 5 (ref 5.0–8.0)

## 2020-04-28 LAB — CBG MONITORING, ED
Glucose-Capillary: 182 mg/dL — ABNORMAL HIGH (ref 70–99)
Glucose-Capillary: 165 mg/dL — ABNORMAL HIGH (ref 70–99)

## 2020-04-28 LAB — RESP PANEL BY RT-PCR (FLU A&B, COVID) ARPGX2
Influenza A by PCR: NEGATIVE
Influenza B by PCR: NEGATIVE
SARS Coronavirus 2 by RT PCR: NEGATIVE

## 2020-04-28 LAB — IRON AND TIBC
Iron: 38 ug/dL — ABNORMAL LOW (ref 45–182)
Saturation Ratios: 16 % — ABNORMAL LOW (ref 17.9–39.5)
TIBC: 232 ug/dL — ABNORMAL LOW (ref 250–450)
UIBC: 194 ug/dL

## 2020-04-28 LAB — FERRITIN: Ferritin: 704 ng/mL — ABNORMAL HIGH (ref 24–336)

## 2020-04-28 LAB — GLUCOSE, CAPILLARY: Glucose-Capillary: 185 mg/dL — ABNORMAL HIGH (ref 70–99)

## 2020-04-28 LAB — POC OCCULT BLOOD, ED: Fecal Occult Bld: NEGATIVE

## 2020-04-28 LAB — RETICULOCYTES
Immature Retic Fract: 29.1 % — ABNORMAL HIGH (ref 2.3–15.9)
RBC.: 2.75 MIL/uL — ABNORMAL LOW (ref 4.22–5.81)
Retic Count, Absolute: 68.8 10*3/uL (ref 19.0–186.0)
Retic Ct Pct: 2.5 % (ref 0.4–3.1)

## 2020-04-28 MED ORDER — APIXABAN 5 MG PO TABS
5.0000 mg | ORAL_TABLET | Freq: Two times a day (BID) | ORAL | Status: DC
  Administered 2020-04-28 – 2020-04-29 (×2): 5 mg via ORAL
  Filled 2020-04-28 (×3): qty 1

## 2020-04-28 MED ORDER — INSULIN ASPART 100 UNIT/ML ~~LOC~~ SOLN: 0.0000 [IU] | Freq: Every day | SUBCUTANEOUS | Status: DC

## 2020-04-28 MED ORDER — IOHEXOL 300 MG/ML  SOLN
80.0000 mL | Freq: Once | INTRAMUSCULAR | Status: AC | PRN
  Administered 2020-04-28: 80 mL via INTRAVENOUS

## 2020-04-28 MED ORDER — SODIUM CHLORIDE 0.9 % IV BOLUS
1000.0000 mL | Freq: Once | INTRAVENOUS | Status: AC
  Administered 2020-04-28: 1000 mL via INTRAVENOUS

## 2020-04-28 MED ORDER — ACETAMINOPHEN 650 MG RE SUPP: 650.0000 mg | Freq: Four times a day (QID) | RECTAL | Status: DC | PRN

## 2020-04-28 MED ORDER — ACETAMINOPHEN 325 MG PO TABS
650.0000 mg | ORAL_TABLET | Freq: Four times a day (QID) | ORAL | Status: DC | PRN
  Administered 2020-04-29: 650 mg via ORAL

## 2020-04-28 MED ORDER — SODIUM CHLORIDE 0.9 % IV SOLN
2.0000 g | INTRAVENOUS | Status: DC
  Administered 2020-04-28 – 2020-05-01 (×4): 2 g via INTRAVENOUS
  Filled 2020-04-28 (×4): qty 20

## 2020-04-28 MED ORDER — ONDANSETRON HCL 4 MG/2ML IJ SOLN
4.0000 mg | Freq: Once | INTRAMUSCULAR | Status: AC
  Administered 2020-04-28: 4 mg via INTRAVENOUS
  Filled 2020-04-28: qty 2

## 2020-04-28 MED ORDER — INSULIN ASPART 100 UNIT/ML ~~LOC~~ SOLN
0.0000 [IU] | Freq: Three times a day (TID) | SUBCUTANEOUS | Status: DC
  Administered 2020-04-29: 2 [IU] via SUBCUTANEOUS
  Administered 2020-04-29 – 2020-05-01 (×6): 3 [IU] via SUBCUTANEOUS
  Administered 2020-05-01: 2 [IU] via SUBCUTANEOUS
  Administered 2020-05-02: 3 [IU] via SUBCUTANEOUS

## 2020-04-28 MED ORDER — MAGNESIUM SULFATE 2 GM/50ML IV SOLN
2.0000 g | INTRAVENOUS | Status: AC
  Administered 2020-04-28: 2 g via INTRAVENOUS
  Filled 2020-04-28: qty 50

## 2020-04-28 MED ORDER — LEVOTHYROXINE SODIUM 75 MCG PO TABS
175.0000 ug | ORAL_TABLET | Freq: Every day | ORAL | Status: DC
  Administered 2020-04-29 – 2020-05-02 (×3): 175 ug via ORAL
  Filled 2020-04-28 (×3): qty 1

## 2020-04-28 MED ORDER — SODIUM CHLORIDE 0.9 % IV SOLN: INTRAVENOUS | Status: AC

## 2020-04-28 MED ORDER — GABAPENTIN 300 MG PO CAPS
300.0000 mg | ORAL_CAPSULE | Freq: Two times a day (BID) | ORAL | Status: DC
  Administered 2020-04-28 – 2020-05-02 (×8): 300 mg via ORAL
  Filled 2020-04-28 (×8): qty 1

## 2020-04-28 NOTE — Clinical Social Work Note (Signed)
[place] notification complete. [id number]

## 2020-04-28 NOTE — H&P (Signed)
History and Physical    [name] OEV:[id number] DOB: 02-22-1945 DOA: 04/28/2020  PCP: Dettinger, Fransisca Kaufmann, MD   Patient coming from: Home  I have personally briefly reviewed patient's old medical records in [place]  Chief Complaint: Weakness  HPI: [name] is a 75 y.o. male with medical history significant for  COPD, Atria fib + pace maker, diabetes mellitus, hypertension, aortic valve replacement. Patient presented to the ED with complaints of generalized weakness over the past 2 weeks.  Reports 2 days of watery stools, 2 watery stools yesterday none today.  No vomiting.  Reports poor oral intake for 2 weeks.  He is unaware of any black stools or blood in stools (though he did not look at his stool color).  Reports left lower quadrant abdominal pain.  He also reports intermittent pain with urination over the past several days. Reports dizziness when standing, and says due to weakness is barely able to walk. No fevers no chills. No cough no difficulty breathing.  No chest pain.  Patient presented to urgent care, was subsequently referred to the ED.  There was a concern for an abdominal mass, but on evaluation here in the ED appears to be a reducible hernia.  ED Course: Temperature 98.1.  Heart rate 60s to 70s.  Blood pressure systolic 829H to 371I, orthostatic vitals done were positive with systolic blood pressure dropping from 160s to 109.  UA suggestive of UTI.  WBC 8.8.  CT abdomen-showed air-fluid level within the urinary bladder .  2 L bolus given.  Magnesium 1.5. EDP consulted urology about CT findings-likely gas-forming organism, recommended antibiotics, Bactrim for 2 weeks. Hospitalist to admit.  Review of Systems: As per HPI all other systems reviewed and negative.  Past Medical History:  Diagnosis Date  . Arthritis   . Atrial fibrillation ([place])   . BPH (benign prostatic hypertrophy) 04/15/2010  . Cataract   . Colon polyps   . DEPRESSION 01/22/2009  .  Heart valve replaced   . HYPERCHOLESTEROLEMIA 01/22/2009  . HYPERTENSION 01/22/2009   Dr. [name]  . 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 Morbid obesity ([place])   . 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 Tremors of nervous system    ?ptsd  . Ulcer   . Vitreous hemorrhage of left eye ([place]) 08/27/2019    Past Surgical History:  Procedure Laterality Date  . 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 APPENDECTOMY    . bilateral cataract surg    . COLONOSCOPY  04/24/2007   [name]: normal  . COLONOSCOPY WITH ESOPHAGOGASTRODUODENOSCOPY (EGD) N/A 04/05/2012   Procedure: COLONOSCOPY WITH ESOPHAGOGASTRODUODENOSCOPY (EGD);  Surgeon: Daneil Dolin, MD;  Location: [facility];  Service: Endoscopy;  Laterality: N/A;  10;15  . DENTAL SURGERY  05/2004   Dental extractions  . EYE SURGERY Bilateral    cataract  . HEEL SPUR SURGERY Bilateral    resection of heel spur  . KNEE ARTHROSCOPY WITH LATERAL MENISECTOMY Right 04/04/2014   Procedure: KNEE ARTHROSCOPY WITH LATERAL MENISECTOMY;  Surgeon: Carole Civil, MD;  Location: [facility];  Service: Orthopedics;  Laterality: Right;  . PACEMAKER IMPLANT N/A 06/13/2016   Procedure: Pacemaker Implant- Dual Chamber;  Surgeon: Evans Lance, MD;  Location: [facility];  Service: Cardiovascular;  Laterality: N/A;  . PACEMAKER INSERTION  2018  . THYROIDECTOMY  03/21/2013   DR [name]  . THYROIDECTOMY  N/A 03/21/2013   Procedure: THYROIDECTOMY;  Surgeon: [name], MD;  Location: [facility];  Service: General;  Laterality: N/A;  . TONSILLECTOMY       reports that he quit smoking about 58 years ago. His smoking use included cigarettes. He smoked 0.00 packs per day for 12.00 years. He has never used smokeless tobacco. He reports previous alcohol use. He reports that he does not use drugs.  Allergies  Allergen Reactions  . Phenothiazines Anaphylaxis  .  Actos [Pioglitazone] Swelling    Family History  Problem Relation Age of Onset  . Heart disease Father   . Congestive Heart Failure Father   . Arthritis Father   . Diabetes Other   . Benign prostatic hyperplasia Brother     Prior to Admission medications   Medication Sig Start Date End Date Taking? Authorizing Provider  acetaminophen (TYLENOL) 500 MG tablet Take 1,000 mg by mouth every 6 (six) hours as needed (pain).   Yes [provider]  apixaban (ELIQUIS) 5 MG TABS tablet Take 1 tablet (5 mg total) by mouth 2 (two) times daily. 02/17/20  Yes Dettinger, Fransisca Kaufmann, MD  cholecalciferol (VITAMIN D) 1000 units tablet Take 1,000 Units by mouth daily.    Yes [provider]  furosemide (LASIX) 20 MG tablet Take 1 tablet (20 mg total) by mouth daily. 04/17/17  Yes Timmothy Euler, MD  gabapentin (NEURONTIN) 300 MG capsule Take 1 capsule (300 mg total) by mouth 2 (two) times daily. 02/17/20  Yes Dettinger, Fransisca Kaufmann, MD  hydrocortisone cream 1 % Apply 1 application topically 2 (two) times daily. 04/06/20  Yes Ivy Lynn, NP  levothyroxine (SYNTHROID) 175 MCG tablet Take 1 tablet (175 mcg total) by mouth daily before breakfast. 02/17/20  Yes Dettinger, Fransisca Kaufmann, MD  lisinopril (ZESTRIL) 5 MG tablet Take 5 mg by mouth daily.   Yes [provider]  loperamide (IMODIUM A-D) 2 MG tablet Take 1 tablet (2 mg total) by mouth 4 (four) times daily as needed for diarrhea or loose stools. 03/19/20  Yes Dettinger, Fransisca Kaufmann, MD  metFORMIN (GLUCOPHAGE) 1000 MG tablet TAKE 1 TABLET BY MOUTH TWICE DAILY WITH A MEAL. Patient taking differently: Take 1,000 mg by mouth in the morning and at bedtime. 07/24/15  Yes Timmothy Euler, MD  Semaglutide, 1 MG/DOSE, (OZEMPIC, 1 MG/DOSE,) 2 MG/1.5ML SOPN Inject 1 mg into the skin once a week. 02/17/20  Yes Dettinger, Fransisca Kaufmann, MD  simvastatin (ZOCOR) 80 MG tablet Take 0.5 tablets (40 mg total) by mouth at bedtime. 04/04/17  Yes Timmothy Euler,  MD  glucose blood test strip 1 strip by Does not apply route 3 (three) times daily. Uses true metrix strips (diabetic club) 05/12/16   [provider]  insulin detemir (LEVEMIR FLEXTOUCH) 100 UNIT/ML FlexPen Inject 35-40 Units into the skin 2 (two) times daily. Patient taking differently: Inject 30 Units into the skin 2 (two) times daily. 02/17/20   Dettinger, Fransisca Kaufmann, MD  miconazole nitrate (MICATIN) POWD Apply 1 application topically 2 (two) times daily. Patient not taking: No sig reported 04/06/20   Ivy Lynn, NP  Multiple Vitamins-Minerals (MENS MULTI VITAMIN & MINERAL PO) Take 1 tablet by mouth daily.   Patient not taking: Reported on 04/28/2020    [provider]  mupirocin ointment (BACTROBAN) 2 % Apply 1 application topically 2 (two) times daily. Patient not taking: No sig reported 04/23/20   Dettinger, Fransisca Kaufmann, MD  tamsulosin (FLOMAX) 0.4 MG CAPS  capsule TAKE TWO CAPSULES BY MOUTH AT BEDTIME Patient not taking: No sig reported 03/16/20   Dettinger, Fransisca Kaufmann, MD  traMADol (ULTRAM) 50 MG tablet Take 50 mg by mouth 2 (two) times daily. Patient not taking: Reported on 04/28/2020 08/09/18   [provider]    Physical Exam: Vitals:   04/28/20 1330 04/28/20 1400 04/28/20 1430 04/28/20 1600  BP: (!) 152/61 (!) 137/48 (!) 127/43 (!) 109/52  Pulse: 70 71 70 73  Resp: 14 15 (!) 21 20  Temp:      TempSrc:      SpO2: 97% 98% 100% 98%  Weight:      Height:        Constitutional: NAD, calm, comfortable Vitals:   04/28/20 1330 04/28/20 1400 04/28/20 1430 04/28/20 1600  BP: (!) 152/61 (!) 137/48 (!) 127/43 (!) 109/52  Pulse: 70 71 70 73  Resp: 14 15 (!) 21 20  Temp:      TempSrc:      SpO2: 97% 98% 100% 98%  Weight:      Height:       Eyes: PERRL, lids and conjunctivae normal ENMT: Mucous membranes are dry Neck: normal, supple, no masses, no thyromegaly Respiratory: clear to auscultation bilaterally, no wheezing, no crackles. Normal respiratory effort.  No accessory muscle use.  Cardiovascular: Regular rate and rhythm, known 3/6 systolic murmur. No extremity edema. 2+ pedal pulses. Abdomen: no tenderness, no masses palpated. No hepatosplenomegaly. Bowel sounds positive.  Musculoskeletal: no clubbing / cyanosis. No joint deformity upper and lower extremities. Good ROM, no contractures. Normal muscle tone.  Skin: no rashes, lesions, ulcers. No induration Neurologic: Tremor to right hand chronic, no apparent cranial abnormality, 4+/5 strength bilateral upper extremity, 4/5 strength bilateral lower extremity, he is unable to keep his legs up against gravity. Psychiatric: Normal judgment and insight. Alert and oriented x 3. Normal mood.   Labs on Admission: I have personally reviewed following labs and imaging studies  CBC: Recent Labs  Lab 04/28/20 1102 04/28/20 1316  WBC 8.8  --   NEUTROABS 6.5  --   HGB 9.4* 9.0*  HCT 29.2* 27.7*  MCV 101.7*  --   PLT 151  --    Basic Metabolic Panel: Recent Labs  Lab 04/28/20 1102  NA 135  K 4.1  CL 103  CO2 19*  GLUCOSE 242*  BUN 22  CREATININE 1.44*  CALCIUM 8.3*  MG 1.5*   Liver Function Tests: Recent Labs  Lab 04/28/20 1102  AST 18  ALT 10  ALKPHOS 61  BILITOT 0.6  PROT 6.6  ALBUMIN 2.9*   Recent Labs  Lab 04/28/20 1102  LIPASE 24   CBG: Recent Labs  Lab 04/28/20 1553  GLUCAP 182*   Urine analysis:    Component Value Date/Time   COLORURINE YELLOW 04/28/2020 1102   APPEARANCEUR CLOUDY (A) 04/28/2020 1102   APPEARANCEUR Clear 02/27/2020 0922   LABSPEC 1.014 04/28/2020 1102   PHURINE 5.0 04/28/2020 1102   GLUCOSEU 50 (A) 04/28/2020 1102   HGBUR MODERATE (A) 04/28/2020 1102   BILIRUBINUR NEGATIVE 04/28/2020 1102   BILIRUBINUR Negative 02/27/2020 0922   KETONESUR NEGATIVE 04/28/2020 1102   PROTEINUR 30 (A) 04/28/2020 1102   UROBILINOGEN 0.2 08/22/2019 1059   UROBILINOGEN 0.2 04/06/2014 1410   NITRITE NEGATIVE 04/28/2020 1102   LEUKOCYTESUR LARGE (A) 04/28/2020  1102    Radiological Exams on Admission: CT Abdomen Pelvis W Contrast  Result Date: 04/28/2020 CLINICAL DATA:  Abdominal pain, hernia suspected, loss of appetite  and diarrhea for 3 days EXAM: CT ABDOMEN AND PELVIS WITH CONTRAST TECHNIQUE: Multidetector CT imaging of the abdomen and pelvis was performed using the standard protocol following bolus administration of intravenous contrast. CONTRAST:  64mL OMNIPAQUE IOHEXOL 300 MG/ML  SOLN COMPARISON:  10/11/2018 FINDINGS: Lower chest: No acute abnormality. Hepatobiliary: No solid liver abnormality is seen. No gallstones, gallbladder wall thickening, or biliary dilatation. Pancreas: Unremarkable. No pancreatic ductal dilatation or surrounding inflammatory changes. Spleen: Normal in size without significant abnormality. Adrenals/Urinary Tract: Adrenal glands are unremarkable. Kidneys are normal, without renal calculi, solid lesion, or hydronephrosis. Air-fluid level within the urinary bladder. Stomach/Bowel: Stomach is within normal limits. Appendix appears normal. No evidence of bowel wall thickening, distention, or inflammatory changes. Vascular/Lymphatic: Aortic atherosclerosis. No enlarged abdominal or pelvic lymph nodes. Reproductive: No mass or other significant abnormality. Other: No abdominal wall hernia or abnormality. No abdominopelvic ascites. Musculoskeletal: No acute or significant osseous findings. IMPRESSION: 1. No acute CT findings of the abdomen or pelvis to explain abdominal pain, diarrhea, or loss of appetite. 2. Air-fluid level within the urinary bladder. Correlate for recent catheterization. Aortic Atherosclerosis (ICD10-I70.0). Electronically Signed   By: Eddie Candle M.D.   On: 04/28/2020 12:53    EKG: Pending   Assessment/Plan Principal Problem:   Generalized weakness Active Problems:   Anemia   Acute lower UTI   Orthostatic hypotension   Essential hypertension   MURMUR   Aortic valve disease   Type 2 diabetes mellitus with  diabetic neuropathy, unspecified (HCC)   CHF (congestive heart failure) (HCC)   Atrial fibrillation (HCC)   S/P AVR (aortic valve replacement)   COPD (chronic obstructive pulmonary disease) (HCC)   Generalized weakness-likely multifactorial from dehydration from poor oral intake, GI losses, urinary tract infection, and symptomatic anemia.  Abdominal CT shows air-fluid level in bladder otherwise without acute abnormality. -Hydrate, antibiotics -May benefit from physical therapy evaluation prior to discharge  Orthostatic hypotension- Likely from poor oral intake, GI losses, low-dose daily Lasix. Systolic 301 > 601.  -2 Liter bolus given, continue N/s 100cc/hr x 15hrs -Hold Lasix and lisinopril 5mg  for now  Acute anemia-hemoglobin 9, baseline  ~ 12-14.  Appears symptomatic, he is on Eliquis and compliant.  No obvious GI blood loss, stool FOBT negative.  Abdominal CT without evidence of bleeding.  LAst Colonoscopy 2014- normal rectum and colon, repeat in 5 years.  EGD showed inflamed gastric mucosa, and 4 mm duodenal ulcer. -Appears symptomatic, but will start treatment of other contributory factors first -Hold Eliquis for now -CBC in a.m., pending clinical course, will likely need transfusion. -Anemia panel  Urinary tract infection-reports dysuria.  Rules out for sepsis.  Afebrile without leukocytosis.  Tested with large leukocytes many bacteria greater than 50 WBCs.  Abdominal CT shows air-fluid level in bladder. - EDP talked to urology recommended 2 weeks of Bactrim. -Start IV ceftriaxone 2 g daily - Follow up urine cultures  Hypomagnesemia- 1.5. -Repleted  Diabetes mellitus-random glucose 242.  HgbA1c 8.1.  Reports he is off his insulins because he had a blood sugar reading of 45.  Also on 1ce weekly Ozempic and Metformin - SSI- S -Hold Metformin  Chronic diastolic congestive heart-last echo 02/2020, EF 65 to 70%, indeterminate LV diastolic parameters. -Hold Lasix 20 mg  daily  COPD-stable.  Atrial fibrillation-controlled and on anticoagulation with Eliquis.  Not on rate limiting drugs. -Hold Eliquis for now with unexplained anemia.   DVT prophylaxis: SCds Code Status: Full code Family Communication: None at bedside Disposition Plan:  ~  2 days Consults called: None Admission status: Inpt, Med surg I certify that at the point of admission it is my clinical judgment that the patient will require inpatient hospital care spanning beyond 2 midnights from the point of admission due to high intensity of service, high risk for further deterioration and high frequency of surveillance required.   Bethena Roys MD [facility]  04/28/2020, 6:58 PM

## 2020-04-28 NOTE — Discharge Instructions (Addendum)
You have a fullness in your abdomen that feels like a mass/tumor, could be is just scar tissue, and also because you are having left lower abdominal pain, you may have a colon infection called diverticulitis. You need more test than what I can do here today. All that can contribute to you loosing your appetite

## 2020-04-28 NOTE — ED Triage Notes (Signed)
Pt arrived POV from urgent care. Pt states he was told today he has a mass in his abdomen and was told to come to the ED.  Pt c/o a tender abdomen, and diarrhea x2days.

## 2020-04-28 NOTE — ED Provider Notes (Signed)
[facility]    CSN: [id number] Arrival date & time: 04/28/20  6712      History   Chief Complaint No chief complaint on file.   HPI [name] is a 75 y.o. male who presents due o not having loss of appetite for 3-4 days. Diarrhea x 2 yesterday, nausea this am and almost vomited his cereal. He feels weak. " I cant hardly walk" since been in the hospital for a few days last month, and was referred to see neurologist 4/15  Has been having LLQ pain since yesterday.     Past Medical History:  Diagnosis Date  . Arthritis   . Atrial fibrillation ([place])   . BPH (benign prostatic hypertrophy) 04/15/2010  . Cataract   . Colon polyps   . DEPRESSION 01/22/2009  . Heart valve replaced   . HYPERCHOLESTEROLEMIA 01/22/2009  . HYPERTENSION 01/22/2009   Dr. [name]  . 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 Morbid obesity ([place])   . Severe nonproliferative diabetic retinopathy of both eyes ([place]) 08/27/2019  . Tremors of nervous system    ?ptsd  . Ulcer   . Vitreous hemorrhage of left eye ([place]) 08/27/2019    Patient Active Problem List   Diagnosis Date Noted  . Exudative age-related macular degeneration of right eye with active choroidal neovascularization ([place]) 04/14/2020  . Skin tear of left lower leg without complication 45/80/9983  . Tinea cruris 04/06/2020  . Hospital discharge follow-up 03/13/2020  . Ambulatory dysfunction 03/05/2020  . Essential tremor 03/05/2020  . Ataxia 03/03/2020  . Rash 01/03/2020  . 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 Posterior vitreous detachment of right eye 08/27/2019  . Severe nonproliferative diabetic retinopathy of right eye ([place]) 08/27/2019  . Frequency of urination and polyuria 01/29/2019  . Coronary artery disease involving native coronary artery of native heart without angina pectoris 01/27/2019  . Microalbuminuria  12/24/2018  . COPD (chronic obstructive pulmonary disease) ([place]) 06/25/2018  . Thrombocytopenia ([place]) 05/09/2018  . Diabetic neuropathy ([place]) 01/30/2018  . S/P AVR (aortic valve replacement) 11/04/2016  . Atrial fibrillation ([place]) 09/27/2016  . Symptomatic bradycardia 06/12/2016  . Urgency incontinence 11/10/2015  . Erectile dysfunction 11/10/2015  . Hypothyroidism 01/15/2015  . 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 Phimosis 01/15/2015  . CHF (congestive heart failure) ([place]) 01/01/2015  . B12 deficiency 04/08/2014  . 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 Type 2 diabetes mellitus with diabetic neuropathy, unspecified ([place]) 03/12/2014  . 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 GERD (gastroesophageal reflux disease) 11/02/2012  . Dysuria 08/28/2012  . Obesity 08/04/2010  . Benign prostatic hyperplasia 04/15/2010  . CAROTID OCCLUSIVE DISEASE 01/26/2010  . MURMUR 05/27/2009  . Aortic valve disease 05/27/2009  . Hyperlipidemia with target LDL less than 100 01/22/2009  . Depression 01/22/2009  . Essential hypertension 01/22/2009    Past Surgical History:  Procedure Laterality Date  . 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 APPENDECTOMY    . bilateral cataract surg    . COLONOSCOPY  04/24/2007   [name]: normal  . COLONOSCOPY WITH ESOPHAGOGASTRODUODENOSCOPY (EGD) N/A 04/05/2012   Procedure: COLONOSCOPY WITH ESOPHAGOGASTRODUODENOSCOPY (EGD);  Surgeon: Daneil Dolin, MD;  [facility];  Service: Endoscopy;  Laterality: N/A;  10;15  . DENTAL SURGERY  05/2004   Dental extractions  . EYE SURGERY Bilateral    cataract  . HEEL SPUR SURGERY Bilateral  resection of heel spur  . KNEE ARTHROSCOPY WITH LATERAL MENISECTOMY Right 04/04/2014   Procedure: KNEE ARTHROSCOPY WITH LATERAL MENISECTOMY;  Surgeon: Carole Civil, MD;  Location: [facility];  Service: Orthopedics;  Laterality: Right;  . PACEMAKER IMPLANT N/A 06/13/2016   Procedure: Pacemaker  Implant- Dual Chamber;  Surgeon: Evans Lance, MD;  Location: [facility];  Service: Cardiovascular;  Laterality: N/A;  . PACEMAKER INSERTION  2018  . THYROIDECTOMY  03/21/2013   DR [name]  . THYROIDECTOMY N/A 03/21/2013   Procedure: THYROIDECTOMY;  Surgeon: [name], MD;  Location: [facility];  Service: General;  Laterality: N/A;  . TONSILLECTOMY         Home Medications    Prior to Admission medications   Medication Sig Start Date End Date Taking? Authorizing Provider  acetaminophen (TYLENOL) 500 MG tablet Take 1,000 mg by mouth every 6 (six) hours as needed (pain).    [provider]  apixaban (ELIQUIS) 5 MG TABS tablet Take 1 tablet (5 mg total) by mouth 2 (two) times daily. 02/17/20   Dettinger, Fransisca Kaufmann, MD  cholecalciferol (VITAMIN D) 1000 units tablet Take 1,000 Units by mouth daily.     [provider]  furosemide (LASIX) 20 MG tablet Take 1 tablet (20 mg total) by mouth daily. 04/17/17   Timmothy Euler, MD  gabapentin (NEURONTIN) 300 MG capsule Take 1 capsule (300 mg total) by mouth 2 (two) times daily. 02/17/20   Dettinger, Fransisca Kaufmann, MD  glucose blood test strip 1 strip by Does not apply route 3 (three) times daily. Uses true metrix strips (diabetic club) 05/12/16   [provider]  hydrocortisone cream 1 % Apply 1 application topically 2 (two) times daily. 04/06/20   Ivy Lynn, NP  insulin detemir (LEVEMIR FLEXTOUCH) 100 UNIT/ML FlexPen Inject 35-40 Units into the skin 2 (two) times daily. Patient taking differently: Inject 30 Units into the skin 2 (two) times daily. 02/17/20   Dettinger, Fransisca Kaufmann, MD  levothyroxine (SYNTHROID) 175 MCG tablet Take 1 tablet (175 mcg total) by mouth daily before breakfast. 02/17/20   Dettinger, Fransisca Kaufmann, MD  lisinopril (ZESTRIL) 5 MG tablet Take 5 mg by mouth daily.    [provider]  loperamide (IMODIUM A-D) 2 MG tablet Take 1 tablet (2 mg total) by mouth 4 (four) times daily as needed for  diarrhea or loose stools. 03/19/20   Dettinger, Fransisca Kaufmann, MD  metFORMIN (GLUCOPHAGE) 1000 MG tablet TAKE 1 TABLET BY MOUTH TWICE DAILY WITH A MEAL. Patient taking differently: Take 1,000 mg by mouth in the morning and at bedtime. 07/24/15   Timmothy Euler, MD  miconazole nitrate (MICATIN) POWD Apply 1 application topically 2 (two) times daily. 04/06/20   Ivy Lynn, NP  Multiple Vitamins-Minerals (MENS MULTI VITAMIN & MINERAL PO) Take 1 tablet by mouth daily.      [provider]  mupirocin ointment (BACTROBAN) 2 % Apply 1 application topically 2 (two) times daily. 04/23/20   Dettinger, Fransisca Kaufmann, MD  Semaglutide, 1 MG/DOSE, (OZEMPIC, 1 MG/DOSE,) 2 MG/1.5ML SOPN Inject 1 mg into the skin once a week. 02/17/20   Dettinger, Fransisca Kaufmann, MD  simvastatin (ZOCOR) 80 MG tablet Take 0.5 tablets (40 mg total) by mouth at bedtime. 04/04/17   Timmothy Euler, MD  tamsulosin (FLOMAX) 0.4 MG CAPS capsule TAKE TWO CAPSULES BY MOUTH AT BEDTIME 03/16/20   Dettinger, Fransisca Kaufmann, MD  traMADol (ULTRAM) 50 MG tablet Take 50 mg by  mouth 2 (two) times daily. 08/09/18   [provider]    Family History Family History  Problem Relation Age of Onset  . Heart disease Father   . Congestive Heart Failure Father   . Arthritis Father   . Diabetes Other   . Benign prostatic hyperplasia Brother     Social History Social History   Tobacco Use  . Smoking status: Former Smoker    Packs/day: 0.00    Years: 12.00    Pack years: 0.00    Types: Cigarettes    Quit date: 09/20/1961    Years since quitting: 58.6  . Smokeless tobacco: Never Used  Vaping Use  . Vaping Use: Never used  Substance Use Topics  . Alcohol use: Not Currently  . Drug use: No     Allergies   Phenothiazines and Actos [pioglitazone]   Review of Systems Review of Systems   Physical Exam Triage Vital Signs ED Triage Vitals  Enc Vitals Group     BP 04/28/20 0940 (!) 98/46     Pulse Rate 04/28/20 0940 82     Resp  04/28/20 0940 18     Temp 04/28/20 0940 97.7 F (36.5 C)     Temp Source 04/28/20 0940 Oral     SpO2 04/28/20 0940 97 %     Weight --      Height --      Head Circumference --      Peak Flow --      Pain Score 04/28/20 0943 0     Pain Loc --      Pain Edu? --      Excl. in [place]? --    No data found.  Updated Vital Signs BP 111/62   Pulse 82   Temp 97.7 F (36.5 C) (Oral)   Resp 18   SpO2 97%   Visual Acuity Right Eye Distance:   Left Eye Distance:   Bilateral Distance:    Right Eye Near:   Left Eye Near:    Bilateral Near:     Physical Exam Constitutional:      General: He is not in acute distress.    Appearance: He is obese. He is not toxic-appearing.  HENT:     Right Ear: External ear normal.     Left Ear: External ear normal.  Eyes:     General: No scleral icterus.    Conjunctiva/sclera: Conjunctivae normal.  Cardiovascular:     Rate and Rhythm: Normal rate and regular rhythm.     Heart sounds: Murmur heard.      Comments: Slightly irregular Pulmonary:     Effort: Pulmonary effort is normal.     Breath sounds: Normal breath sounds.  Abdominal:     General: Bowel sounds are normal.     Tenderness: There is abdominal tenderness.     Comments: Possible mass from mid R abd, to above umbilicus whch is dull with percussion. + guarding on LLQ with no rebound  Musculoskeletal:        General: Normal range of motion.     Cervical back: Neck supple.  Skin:    General: Skin is warm and dry.  Neurological:     Mental Status: He is alert and oriented to person, place, and time.     Gait: Gait normal.  Psychiatric:        Mood and Affect: Mood normal.        Behavior: Behavior normal.  Thought Content: Thought content normal.        Judgment: Judgment normal.      UC Treatments / Results  Labs (all labs ordered are listed, but only abnormal results are displayed) Labs Reviewed - No data to display  EKG   Radiology No results  found.  Procedures Procedures (including critical care time)  Medications Ordered in UC Medications - No data to display  Initial Impression / Assessment and Plan / UC Course  I have reviewed the triage vital signs and the nursing notes. Has acute LLQ pain and weakness. Sent to the hospital for further work up.  Final Clinical Impressions(s) / UC Diagnoses   Final diagnoses:  None   Discharge Instructions   None    ED Prescriptions    None     PDMP not reviewed this encounter.   [name], PA-C 04/28/20 1650

## 2020-04-28 NOTE — ED Notes (Signed)
Patient is being discharged from the Urgent Care and sent to the Emergency Department via pov . Per [facility] , patient is in need of higher level of care due to no appetite. Patient is aware and verbalizes understanding of plan of care.  Vitals:   04/28/20 0940 04/28/20 0942  BP: (!) 98/46 111/62  Pulse: 82   Resp: 18   Temp: 97.7 F (36.5 C)   SpO2: 97%

## 2020-04-28 NOTE — ED Notes (Signed)
Report received, care assumed.

## 2020-04-28 NOTE — ED Provider Notes (Signed)
[facility] Provider Note   CSN: [id number] Arrival date & time: 04/28/20  1029     History Chief Complaint  Patient presents with  . Mass    Sent from urgent care     [name] is a 75 y.o. male who presents from the urgent care for evaluation of diarrhea and perceived abdominal mass.  Patient has had 5 days of watery brown diarrhea.  He has a history of chronic bilateral leg weakness which she feels has worsened over the past 5 days.  He has had some intermittent nausea and decreased appetite but no vomiting.  He denies any active abdominal pain.  He apparently went to the urgent care earlier today and provider at the urgent care was concerned for abdominal mass.  He has no contacts with similar symptoms has not been on any antibiotics recently.  He denies fever, chills, bloody or melanotic stool.  He is on Eliquis for history of atrial fibrillation.  Patient has a history of familial tremors.  HPI     Past Medical History:  Diagnosis Date  . Arthritis   . Atrial fibrillation ([place])   . BPH (benign prostatic hypertrophy) 04/15/2010  . Cataract   . Colon polyps   . DEPRESSION 01/22/2009  . Heart valve replaced   . HYPERCHOLESTEROLEMIA 01/22/2009  . HYPERTENSION 01/22/2009   Dr. [name]  . 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 Morbid obesity ([place])   . Severe nonproliferative diabetic retinopathy of both eyes ([place]) 08/27/2019  . Tremors of nervous system    ?ptsd  . Ulcer   . Vitreous hemorrhage of left eye ([place]) 08/27/2019    Patient Active Problem List   Diagnosis Date Noted  . Exudative age-related macular degeneration of right eye with active choroidal neovascularization ([place]) 04/14/2020  . Skin tear of left lower leg without complication 16/60/6301  . Tinea cruris 04/06/2020  . Hospital discharge follow-up 03/13/2020  . Ambulatory dysfunction 03/05/2020  . Essential tremor 03/05/2020  . Ataxia 03/03/2020  . Rash  01/03/2020  . 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 Posterior vitreous detachment of right eye 08/27/2019  . Severe nonproliferative diabetic retinopathy of right eye ([place]) 08/27/2019  . Frequency of urination and polyuria 01/29/2019  . Coronary artery disease involving native coronary artery of native heart without angina pectoris 01/27/2019  . Microalbuminuria 12/24/2018  . COPD (chronic obstructive pulmonary disease) ([place]) 06/25/2018  . Thrombocytopenia ([place]) 05/09/2018  . Diabetic neuropathy ([place]) 01/30/2018  . S/P AVR (aortic valve replacement) 11/04/2016  . Atrial fibrillation ([place]) 09/27/2016  . Symptomatic bradycardia 06/12/2016  . Urgency incontinence 11/10/2015  . Erectile dysfunction 11/10/2015  . Hypothyroidism 01/15/2015  . 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 Phimosis 01/15/2015  . CHF (congestive heart failure) ([place]) 01/01/2015  . B12 deficiency 04/08/2014  . 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 Type 2 diabetes mellitus with diabetic neuropathy, unspecified ([place]) 03/12/2014  . 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 GERD (gastroesophageal reflux disease) 11/02/2012  . Dysuria 08/28/2012  . Obesity 08/04/2010  . Benign prostatic hyperplasia 04/15/2010  . CAROTID OCCLUSIVE DISEASE 01/26/2010  . MURMUR 05/27/2009  . Aortic valve disease 05/27/2009  . Hyperlipidemia with target LDL less than 100 01/22/2009  . Depression 01/22/2009  . Essential hypertension 01/22/2009    Past Surgical History:  Procedure Laterality Date  . 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  APPENDECTOMY    . bilateral cataract surg    . COLONOSCOPY  04/24/2007   [name]: normal  . COLONOSCOPY WITH ESOPHAGOGASTRODUODENOSCOPY (EGD) N/A 04/05/2012   Procedure: COLONOSCOPY WITH ESOPHAGOGASTRODUODENOSCOPY (EGD);  Surgeon: Daneil Dolin, MD;  [facility];  Service:  Endoscopy;  Laterality: N/A;  10;15  . DENTAL SURGERY  05/2004   Dental extractions  . EYE SURGERY Bilateral    cataract  . HEEL SPUR SURGERY Bilateral    resection of heel spur  . KNEE ARTHROSCOPY WITH LATERAL MENISECTOMY Right 04/04/2014   Procedure: KNEE ARTHROSCOPY WITH LATERAL MENISECTOMY;  Surgeon: Carole Civil, MD;  Location: [facility];  Service: Orthopedics;  Laterality: Right;  . PACEMAKER IMPLANT N/A 06/13/2016   Procedure: Pacemaker Implant- Dual Chamber;  Surgeon: Evans Lance, MD;  Location: [facility];  Service: Cardiovascular;  Laterality: N/A;  . PACEMAKER INSERTION  2018  . THYROIDECTOMY  03/21/2013   DR [name]  . THYROIDECTOMY N/A 03/21/2013   Procedure: THYROIDECTOMY;  Surgeon: [name], MD;  Location: [facility];  Service: General;  Laterality: N/A;  . TONSILLECTOMY         Family History  Problem Relation Age of Onset  . Heart disease Father   . Congestive Heart Failure Father   . Arthritis Father   . Diabetes Other   . Benign prostatic hyperplasia Brother     Social History   Tobacco Use  . Smoking status: Former Smoker    Packs/day: 0.00    Years: 12.00    Pack years: 0.00    Types: Cigarettes    Quit date: 09/20/1961    Years since quitting: 58.6  . Smokeless tobacco: Never Used  Vaping Use  . Vaping Use: Never used  Substance Use Topics  . Alcohol use: Not Currently  . Drug use: No    Home Medications Prior to Admission medications   Medication Sig Start Date End Date Taking? Authorizing Provider  acetaminophen (TYLENOL) 500 MG tablet Take 1,000 mg by mouth every 6 (six) hours as needed (pain).    [provider]  apixaban (ELIQUIS) 5 MG TABS tablet Take 1 tablet (5 mg total) by mouth 2 (two) times daily. 02/17/20   Dettinger, Fransisca Kaufmann, MD  cholecalciferol (VITAMIN D) 1000 units tablet Take 1,000 Units by mouth daily.     [provider]  furosemide (LASIX) 20 MG tablet Take 1 tablet (20 mg total) by mouth  daily. 04/17/17   Timmothy Euler, MD  gabapentin (NEURONTIN) 300 MG capsule Take 1 capsule (300 mg total) by mouth 2 (two) times daily. 02/17/20   Dettinger, Fransisca Kaufmann, MD  glucose blood test strip 1 strip by Does not apply route 3 (three) times daily. Uses true metrix strips (diabetic club) 05/12/16   [provider]  hydrocortisone cream 1 % Apply 1 application topically 2 (two) times daily. 04/06/20   Ivy Lynn, NP  insulin detemir (LEVEMIR FLEXTOUCH) 100 UNIT/ML FlexPen Inject 35-40 Units into the skin 2 (two) times daily. Patient taking differently: Inject 30 Units into the skin 2 (two) times daily. 02/17/20   Dettinger, Fransisca Kaufmann, MD  levothyroxine (SYNTHROID) 175 MCG tablet Take 1 tablet (175 mcg total) by mouth daily before breakfast. 02/17/20   Dettinger, Fransisca Kaufmann, MD  lisinopril (ZESTRIL) 5 MG tablet Take 5 mg by mouth daily.    [provider]  loperamide (IMODIUM A-D) 2 MG tablet Take 1 tablet (2 mg total) by mouth 4 (  four) times daily as needed for diarrhea or loose stools. 03/19/20   Dettinger, Fransisca Kaufmann, MD  metFORMIN (GLUCOPHAGE) 1000 MG tablet TAKE 1 TABLET BY MOUTH TWICE DAILY WITH A MEAL. Patient taking differently: Take 1,000 mg by mouth in the morning and at bedtime. 07/24/15   Timmothy Euler, MD  miconazole nitrate (MICATIN) POWD Apply 1 application topically 2 (two) times daily. 04/06/20   Ivy Lynn, NP  Multiple Vitamins-Minerals (MENS MULTI VITAMIN & MINERAL PO) Take 1 tablet by mouth daily.      [provider]  mupirocin ointment (BACTROBAN) 2 % Apply 1 application topically 2 (two) times daily. 04/23/20   Dettinger, Fransisca Kaufmann, MD  Semaglutide, 1 MG/DOSE, (OZEMPIC, 1 MG/DOSE,) 2 MG/1.5ML SOPN Inject 1 mg into the skin once a week. 02/17/20   Dettinger, Fransisca Kaufmann, MD  simvastatin (ZOCOR) 80 MG tablet Take 0.5 tablets (40 mg total) by mouth at bedtime. 04/04/17   Timmothy Euler, MD  tamsulosin (FLOMAX) 0.4 MG CAPS capsule TAKE TWO CAPSULES  BY MOUTH AT BEDTIME 03/16/20   Dettinger, Fransisca Kaufmann, MD  traMADol (ULTRAM) 50 MG tablet Take 50 mg by mouth 2 (two) times daily. 08/09/18   [provider]    Allergies    Phenothiazines and Actos [pioglitazone]  Review of Systems   Review of Systems Ten systems reviewed and are negative for acute change, except as noted in the HPI.   Physical Exam Updated Vital Signs Temp 98.1 F (36.7 C) (Oral)   Ht 6\' 1"  (1.854 m)   Wt 106.1 kg   BMI 30.87 kg/m   Physical Exam Vitals and nursing note reviewed.  Constitutional:      General: He is not in acute distress.    Appearance: He is well-developed. He is not diaphoretic.  HENT:     Head: Normocephalic and atraumatic.  Eyes:     General: No scleral icterus.    Conjunctiva/sclera: Conjunctivae normal.  Cardiovascular:     Rate and Rhythm: Normal rate and regular rhythm.     Heart sounds: Normal heart sounds.  Pulmonary:     Effort: Pulmonary effort is normal. No respiratory distress.     Breath sounds: Normal breath sounds.  Abdominal:     General: A surgical scar is present.     Palpations: Abdomen is soft. There is no mass.     Tenderness: There is no abdominal tenderness.     Hernia: A hernia is present.     Comments: Large left lower quadrant vertical abdominal scar.  Midline palpable but easily reducible hernia.  No other masses palpated.  Musculoskeletal:     Cervical back: Normal range of motion and neck supple.  Skin:    General: Skin is warm and dry.  Neurological:     Mental Status: He is alert.     Comments: 5 out of 5 strength in the bilateral upper extremities, 4 out of 5 strength of bilateral lower extremities. Tremors present  Psychiatric:        Behavior: Behavior normal.     ED Results / Procedures / Treatments   Labs (all labs ordered are listed, but only abnormal results are displayed) Labs Reviewed  C DIFFICILE QUICK SCREEN W PCR REFLEX  GASTROINTESTINAL PANEL BY PCR, STOOL (REPLACES  STOOL CULTURE)  CBC WITH DIFFERENTIAL/PLATELET  COMPREHENSIVE METABOLIC PANEL  LIPASE, BLOOD  URINALYSIS, ROUTINE W REFLEX MICROSCOPIC  MAGNESIUM    EKG None  Radiology No results found.  Procedures Procedures  Medications Ordered in ED Medications  ondansetron (ZOFRAN) injection 4 mg (has no administration in time range)  sodium chloride 0.9 % bolus 1,000 mL (has no administration in time range)    ED Course  I have reviewed the triage vital signs and the nursing notes.  Pertinent labs & imaging results that were available during my care of the patient were reviewed by me and considered in my medical decision making (see chart for details).  Clinical Course as of 04/28/20 1828  Tue Apr 28, 2020  1315 Hemoglobin appears to have dropped by half.  Digital rectal exam shows anal sphincter with normal tone, no thrombosed hemorrhoids, no overt red blood or melena, stool color pale yellow.  Hemoccult negative. We will repeat an H&H rule out lab error [AH]  1723 Hemoglobin(!): 9.4 [AH]    Clinical Course User Index [AH] Margarita Mail, PA-C   MDM Rules/Calculators/A&P                          75 year old male here for evaluation of diarrhea and weakness.The differential diagnosis of diarrhea includes but is not limited to Viral- norovirus/rotavirus; Bacterial-Campylobacter,Shigella, Salmonella, Escherichia coli, E. coli 0157:H7, Yersinia enterocolitica, Vibrio cholerae, Clostridium difficile. Parasitic- Giardia lamblia, Cryptosporidium,Entamoeba histolytica,Cyclospora, Microsporidium. Toxin- Staphylococcus aureus, Bacillus cereus. Noninfectious causes include GI Bleed, Appendicitis, Mesenteric Ischemia, Diverticulitis, Adrenal Crisis, Thyroid Storm, Toxicologic exposures, Antibiotic or drug-associated, inflammatory bowel disease. The differential diagnosis of weakness includes but is not limited to neurologic causes (GBS, myasthenia gravis, CVA, MS, ALS, transverse myelitis,  spinal cord injury, CVA, botulism, ) and other causes: ACS, Arrhythmia, syncope, orthostatic hypotension, sepsis, hypoglycemia, electrolyte disturbance, hypothyroidism, respiratory failure, symptomatic anemia, dehydration, heat injury, polypharmacy, malignancy. I ordered and reviewed labs that included CBC with hemoglobin of 9.4 which is down from 18.7 a few days ago.  Baseline appears to be normally around 13 or 14.  Respiratory panel is negative for Covid, CMP shows baseline renal insufficiency unchanged, mildly low bicarb light likely due to his chronic diarrhea.  Lipase within normal limits, magnesium level low and repleted via IV.  UA is positive for infection. I ordered interpreted and reviewed a CT abdomen and pelvis which shows no acute abnormalities except for air-fluid level in the urine.  I discussed this with Dr. Alyson Ingles of urology as the patient has not had any recent catheterizations likely an air forming organism.  I have treated with Rocephin and he recommends a 2-week course of Bactrim.  The patient had extremely positive orthostatic vital signs with dizziness and went from a blood pressure of 170 down to 109 with standing.  His repeat hemoglobin does show that he is generally dropped his hemoglobin down to 9.  Patient feels weak and dizzy and this is likely representing symptomatic anemia of unknown etiology.  I discussed the case with Dr. Denton Brick who will admit the patient.  Type and screen ordered for potential need for transfusion. Final Clinical Impression(s) / ED Diagnoses Final diagnoses:  None    Rx / DC Orders ED Discharge Orders    None       Margarita Mail, PA-C 04/28/20 1839    Milton Ferguson, MD 04/29/20 [id number]

## 2020-04-28 NOTE — ED Triage Notes (Signed)
No appetite for past 3-4 days

## 2020-04-29 ENCOUNTER — Encounter (HOSPITAL_COMMUNITY): Payer: Self-pay | Admitting: Internal Medicine

## 2020-04-29 DIAGNOSIS — R531 Weakness: Secondary | ICD-10-CM

## 2020-04-29 DIAGNOSIS — N390 Urinary tract infection, site not specified: Secondary | ICD-10-CM

## 2020-04-29 DIAGNOSIS — N3000 Acute cystitis without hematuria: Secondary | ICD-10-CM

## 2020-04-29 DIAGNOSIS — I951 Orthostatic hypotension: Principal | ICD-10-CM

## 2020-04-29 LAB — CBC
HCT: 27.5 % — ABNORMAL LOW (ref 39.0–52.0)
Hemoglobin: 8.9 g/dL — ABNORMAL LOW (ref 13.0–17.0)
MCH: 33 pg (ref 26.0–34.0)
MCHC: 32.4 g/dL (ref 30.0–36.0)
MCV: 101.9 fL — ABNORMAL HIGH (ref 80.0–100.0)
Platelets: 142 10*3/uL — ABNORMAL LOW (ref 150–400)
RBC: 2.7 MIL/uL — ABNORMAL LOW (ref 4.22–5.81)
RDW: 17.7 % — ABNORMAL HIGH (ref 11.5–15.5)
WBC: 7.4 10*3/uL (ref 4.0–10.5)
nRBC: 0 % (ref 0.0–0.2)

## 2020-04-29 LAB — TYPE AND SCREEN
ABO/RH(D): A POS
Antibody Screen: NEGATIVE

## 2020-04-29 LAB — BASIC METABOLIC PANEL
Anion gap: 8 (ref 5–15)
BUN: 16 mg/dL (ref 8–23)
CO2: 22 mmol/L (ref 22–32)
Calcium: 7.6 mg/dL — ABNORMAL LOW (ref 8.9–10.3)
Chloride: 108 mmol/L (ref 98–111)
Creatinine, Ser: 1.24 mg/dL (ref 0.61–1.24)
GFR, Estimated: >60 mL/min (ref >60)
Glucose, Bld: 164 mg/dL — ABNORMAL HIGH (ref 70–99)
Potassium: 4 mmol/L (ref 3.5–5.1)
Sodium: 138 mmol/L (ref 135–145)

## 2020-04-29 LAB — FOLATE: Folate: 17.4 ng/mL (ref >5.9)

## 2020-04-29 LAB — GLUCOSE, CAPILLARY
Glucose-Capillary: 156 mg/dL — ABNORMAL HIGH (ref 70–99)
Glucose-Capillary: 152 mg/dL — ABNORMAL HIGH (ref 70–99)
Glucose-Capillary: 147 mg/dL — ABNORMAL HIGH (ref 70–99)
Glucose-Capillary: 183 mg/dL — ABNORMAL HIGH (ref 70–99)

## 2020-04-29 LAB — MAGNESIUM: Magnesium: 1.8 mg/dL (ref 1.7–2.4)

## 2020-04-29 LAB — VITAMIN B12: Vitamin B-12: 195 pg/mL (ref 180–914)

## 2020-04-29 MED ORDER — SODIUM CHLORIDE 0.9 % IV SOLN: INTRAVENOUS | Status: DC

## 2020-04-29 MED ORDER — PANTOPRAZOLE SODIUM 40 MG PO TBEC
40.0000 mg | DELAYED_RELEASE_TABLET | Freq: Every day | ORAL | Status: DC
  Administered 2020-04-29 – 2020-05-02 (×4): 40 mg via ORAL
  Filled 2020-04-29 (×4): qty 1

## 2020-04-29 NOTE — Plan of Care (Signed)
  Problem: Acute Rehab PT Goals(only PT should resolve) Goal: Pt Will Go Supine/Side To Sit Outcome: Progressing Flowsheets (Taken 04/29/2020 1406) Pt will go Supine/Side to Sit:  Independently  with modified independence Goal: Patient Will Transfer Sit To/From Stand Outcome: Progressing Flowsheets (Taken 04/29/2020 1406) Patient will transfer sit to/from stand:  Independently  with modified independence Goal: Pt Will Transfer Bed To Chair/Chair To Bed Outcome: Progressing Flowsheets (Taken 04/29/2020 1406) Pt will Transfer Bed to [place] to Bed:  Independently  with modified independence Goal: Pt Will Ambulate Outcome: Progressing Flowsheets (Taken 04/29/2020 1406) Pt will Ambulate:  100 feet  with modified independence  Independently   2:07 PM, 04/29/20 Lonell Grandchild, MPT Physical Therapist with [facility] [phone] office [phone] mobile phone

## 2020-04-29 NOTE — Consult Note (Signed)
Referring Provider: Dr. Algis Liming  Primary Care Physician:  Dettinger, Fransisca Kaufmann, MD Primary Gastroenterologist:  Dr. Gala Romney   Date of Admission: 04/28/20 Date of Consultation: 04/29/20  Reason for Consultation:  Anemia in setting of anticoagulation, heme negative   HPI:  [name] is a 75 y.o. year old male last seen by [facility] in 2014 due to heme positive stool, anemia, and melena. He underwent colonoscopy in 2014 that was unrevealing, and EGD showed small duodenal bulbar ulcer. Negative H.pylori. He was lost to follow-up thereafter. Presented to the ED yesterday from Urgent Care eval due to diarrhea and concern for abdominal mass. On Eliquis with history of afib. Hgb found to be 9.4, down from 13 in Feb 2022. I do note Hgb was 18 in  March 2022 but query if this was correct. Today, Hgb 8.9. B12 low normal at 195. Ferritin elevated at 704, iron low at 38 and sats low at 16. He was actually heme negative. CT abd/pelvis with contrast without acute findings. Air-fluid level within urinary bladder. Admitted with weakness, orthostatic hypotension, acute anemia, UTI.   Patient states he had 3 loose stools yesterday and burning with urination, so he presented to the Urgent Care. Denies abdominal pain. No overt GI bleeding. Has a good appetite. No dysphagia. Takes Tylenol. No NSAIDs. No prior changes in bowel habits prior to yesterday. Believes he lost 7 lbs in last 2-4 weeks. Denies fatigue, chest pain, shortness of breath.   Does not want to pursue endoscopic evaluation while inpatient. He desires to pursue this as outpatient.     Past Medical History:  Diagnosis Date  . Arthritis   . Atrial fibrillation ([place])   . BPH (benign prostatic hypertrophy) 04/15/2010  . Cataract   . Colon polyps   . DEPRESSION 01/22/2009  . Heart valve replaced   . HYPERCHOLESTEROLEMIA 01/22/2009  . HYPERTENSION 01/22/2009   Dr. [name]  . 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 Morbid  obesity ([place])   . 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 Tremors of nervous system    ?ptsd  . Ulcer   . Vitreous hemorrhage of left eye ([place]) 08/27/2019    Past Surgical History:  Procedure Laterality Date  . 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 APPENDECTOMY    . bilateral cataract surg    . COLONOSCOPY  04/24/2007   [name]: normal  . COLONOSCOPY WITH ESOPHAGOGASTRODUODENOSCOPY (EGD) N/A 04/05/2012   normal rectum, somewhat elongated and redundant colon, otherwise normal. Remote history of colon polyps. EGD: normal esophagus, bile-stained gastric mucosa, diffuse patchy gastric erythema, small duodenal bulbar ulcer with associated erosions. Negative H.pylori. H.pylori serology also negative.   . DENTAL SURGERY  05/2004   Dental extractions  . EYE SURGERY Bilateral    cataract  . HEEL SPUR SURGERY Bilateral    resection of heel spur  . KNEE ARTHROSCOPY WITH LATERAL MENISECTOMY Right 04/04/2014   Procedure: KNEE ARTHROSCOPY WITH LATERAL MENISECTOMY;  Surgeon: Carole Civil, MD;  Location: [facility];  Service: Orthopedics;  Laterality: Right;  . PACEMAKER IMPLANT N/A 06/13/2016   Procedure: Pacemaker Implant- Dual Chamber;  Surgeon: Evans Lance, MD;  Location: [facility];  Service: Cardiovascular;  Laterality: N/A;  . PACEMAKER INSERTION  2018  . THYROIDECTOMY  03/21/2013   DR [name]  . THYROIDECTOMY N/A 03/21/2013   Procedure: THYROIDECTOMY;  Surgeon: [name], MD;  Location: [facility];  Service: General;  Laterality: N/A;  . TONSILLECTOMY      Prior to Admission medications   Medication Sig Start Date End Date Taking? Authorizing Provider  acetaminophen (TYLENOL) 500 MG tablet Take 1,000 mg by mouth every 6 (six) hours as needed (pain).   Yes [provider]  apixaban (ELIQUIS) 5 MG TABS tablet Take 1 tablet (5 mg total) by mouth 2 (two) times daily. 02/17/20  Yes Dettinger, Fransisca Kaufmann, MD   cholecalciferol (VITAMIN D) 1000 units tablet Take 1,000 Units by mouth daily.    Yes [provider]  furosemide (LASIX) 20 MG tablet Take 1 tablet (20 mg total) by mouth daily. 04/17/17  Yes Timmothy Euler, MD  gabapentin (NEURONTIN) 300 MG capsule Take 1 capsule (300 mg total) by mouth 2 (two) times daily. 02/17/20  Yes Dettinger, Fransisca Kaufmann, MD  hydrocortisone cream 1 % Apply 1 application topically 2 (two) times daily. 04/06/20  Yes Ivy Lynn, NP  levothyroxine (SYNTHROID) 175 MCG tablet Take 1 tablet (175 mcg total) by mouth daily before breakfast. 02/17/20  Yes Dettinger, Fransisca Kaufmann, MD  lisinopril (ZESTRIL) 5 MG tablet Take 5 mg by mouth daily.   Yes [provider]  loperamide (IMODIUM A-D) 2 MG tablet Take 1 tablet (2 mg total) by mouth 4 (four) times daily as needed for diarrhea or loose stools. 03/19/20  Yes Dettinger, Fransisca Kaufmann, MD  metFORMIN (GLUCOPHAGE) 1000 MG tablet TAKE 1 TABLET BY MOUTH TWICE DAILY WITH A MEAL. Patient taking differently: Take 1,000 mg by mouth in the morning and at bedtime. 07/24/15  Yes Timmothy Euler, MD  Semaglutide, 1 MG/DOSE, (OZEMPIC, 1 MG/DOSE,) 2 MG/1.5ML SOPN Inject 1 mg into the skin once a week. 02/17/20  Yes Dettinger, Fransisca Kaufmann, MD  simvastatin (ZOCOR) 80 MG tablet Take 0.5 tablets (40 mg total) by mouth at bedtime. 04/04/17  Yes Timmothy Euler, MD  glucose blood test strip 1 strip by Does not apply route 3 (three) times daily. Uses true metrix strips (diabetic club) 05/12/16   [provider]  insulin detemir (LEVEMIR FLEXTOUCH) 100 UNIT/ML FlexPen Inject 35-40 Units into the skin 2 (two) times daily. Patient taking differently: Inject 30 Units into the skin 2 (two) times daily. 02/17/20   Dettinger, Fransisca Kaufmann, MD  miconazole nitrate (MICATIN) POWD Apply 1 application topically 2 (two) times daily. Patient not taking: No sig reported 04/06/20   Ivy Lynn, NP  Multiple Vitamins-Minerals (MENS MULTI VITAMIN &  MINERAL PO) Take 1 tablet by mouth daily.   Patient not taking: Reported on 04/28/2020    [provider]  mupirocin ointment (BACTROBAN) 2 % Apply 1 application topically 2 (two) times daily. Patient not taking: No sig reported 04/23/20   Dettinger, Fransisca Kaufmann, MD  tamsulosin (FLOMAX) 0.4 MG CAPS capsule TAKE TWO CAPSULES BY MOUTH AT BEDTIME Patient not taking: No sig reported 03/16/20   Dettinger, Fransisca Kaufmann, MD  traMADol (ULTRAM) 50 MG tablet Take 50 mg by mouth 2 (two) times daily. Patient not taking: Reported on 04/28/2020 08/09/18   [provider]    Current Facility-Administered Medications  Medication Dose Route Frequency Provider Last Rate Last Admin  . 0.9 %  sodium chloride infusion   Intravenous Continuous Emokpae, Ejiroghene E, MD 100 mL/hr at 04/29/20 0726 New Bag at 04/29/20 0726  . acetaminophen (TYLENOL) tablet 650 mg  650 mg Oral Q6H PRN Emokpae, Ejiroghene E, MD   650 mg at 04/29/20 0725   Or  .  acetaminophen (TYLENOL) suppository 650 mg  650 mg Rectal Q6H PRN Emokpae, Ejiroghene E, MD      . apixaban (ELIQUIS) tablet 5 mg  5 mg Oral BID Emokpae, Ejiroghene E, MD   5 mg at 04/28/20 2141  . cefTRIAXone (ROCEPHIN) 2 g in sodium chloride 0.9 % 100 mL IVPB  2 g Intravenous Q24H Emokpae, Ejiroghene E, MD 200 mL/hr at 04/28/20 1846 2 g at 04/28/20 1846  . gabapentin (NEURONTIN) capsule 300 mg  300 mg Oral BID Emokpae, Ejiroghene E, MD   300 mg at 04/29/20 0957  . insulin aspart (novoLOG) injection 0-15 Units  0-15 Units Subcutaneous TID WC Emokpae, Ejiroghene E, MD   3 Units at 04/29/20 0747  . insulin aspart (novoLOG) injection 0-5 Units  0-5 Units Subcutaneous QHS Emokpae, Ejiroghene E, MD      . levothyroxine (SYNTHROID) tablet 175 mcg  175 mcg Oral QAC breakfast Emokpae, Ejiroghene E, MD   175 mcg at 04/29/20 0504    Allergies as of 04/28/2020 - Review Complete 04/28/2020  Allergen Reaction Noted  . Phenothiazines Anaphylaxis 04/15/2010  . Actos [pioglitazone]  Swelling 04/03/2009    Family History  Problem Relation Age of Onset  . Heart disease Father   . Congestive Heart Failure Father   . Arthritis Father   . Diabetes Other   . Benign prostatic hyperplasia Brother   . Colon cancer Neg Hx   . Colon polyps Neg Hx     Social History   Socioeconomic History  . Marital status: Single    Spouse name: Not on file  . Number of children: 1  . Years of education: HS  . Highest education level: Not on file  Occupational History  . Occupation: Administrator, retired    Fish farm manager: RETIRED  Tobacco Use  . Smoking status: Former Smoker    Packs/day: 0.00    Years: 12.00    Pack years: 0.00    Types: Cigarettes    Quit date: 09/20/1961    Years since quitting: 58.6  . Smokeless tobacco: Never Used  Vaping Use  . Vaping Use: Never used  Substance and Sexual Activity  . Alcohol use: Not Currently  . Drug use: No  . Sexual activity: Never    Comment: Has not smoked in 20 years  Other Topics Concern  . Not on file  Social History Narrative   Lives alone.   Quit smoking approximately 28 years ago.   Long haul truck driver, lives in [place].   Social Determinants of Health   Financial Resource Strain: Not on file  Food Insecurity: Not on file  Transportation Needs: Not on file  Physical Activity: Not on file  Stress: Not on file  Social Connections: Not on file  Intimate Partner Violence: Not on file    Review of Systems: Gen: Denies fever, chills, loss of appetite, change in weight or weight loss CV: Denies chest pain, heart palpitations, syncope, edema  Resp: Denies shortness of breath with rest, cough, wheezing GI: see HPI GU : see HPI MS: Denies joint pain,swelling, cramping Derm: Denies rash, itching, dry skin Psych: Denies depression, anxiety,confusion, or memory loss Heme: Denies bruising, bleeding, and enlarged lymph nodes.  Physical Exam: Vital signs in last 24 hours: Temp:  [97.4 F (36.3 C)-98.7 F (37.1 C)]  97.4 F (36.3 C) (04/06 1010) Pulse Rate:  [70-80] 75 (04/06 1010) Resp:  [13-21] 20 (04/06 0504) BP: (109-165)/(43-82) 126/67 (04/06 1010) SpO2:  [95 %-100 %] 99 % (04/06  1010) Last BM Date: 04/28/20 General:   Alert,  Well-developed, well-nourished, pleasant and cooperative in NAD, sitting up in chair Head:  Normocephalic and atraumatic. Eyes:  Sclera clear, no icterus.   Conjunctiva pink. Ears:  Extremely hard of hearing Nose:  No deformity, discharge,  or lesions. Mouth:  No deformity or lesions, dentition normal. Lungs:  Clear throughout to auscultation.   Heart:  S1 S2 present with systolic murmur Abdomen:  Sitting up in chair. Right-sided abdomen more protuberant than left, no TTP. Normal bowel sounds, without guarding, and without rebound.   Rectal:  Deferred until time of colonoscopy.   Msk:  Symmetrical without gross deformities. Normal posture. Extremities:  Without  edema. Neurologic:  Alert and  oriented x4 Skin:  Intact without significant lesions or rashes. Cervical Nodes:  No significant cervical adenopathy. Psych:  Alert and cooperative. Normal mood and affect.  Intake/Output from previous day: 04/05 0701 - 04/06 0700 In: 2680.1 [I.V.:530.1; IV Piggyback:2150] Out: 600 [Urine:600] Intake/Output this shift: Total I/O In: 240 [P.O.:240] Out: -   Lab Results: Recent Labs    04/28/20 1102 04/28/20 1316 04/29/20 0500  WBC 8.8  --  7.4  HGB 9.4* 9.0* 8.9*  HCT 29.2* 27.7* 27.5*  PLT 151  --  142*   BMET Recent Labs    04/28/20 1102 04/29/20 0500  NA 135 138  K 4.1 4.0  CL 103 108  CO2 19* 22  GLUCOSE 242* 164*  BUN 22 16  CREATININE 1.44* 1.24  CALCIUM 8.3* 7.6*   LFT Recent Labs    04/28/20 1102  PROT 6.6  ALBUMIN 2.9*  AST 18  ALT 10  ALKPHOS 61  BILITOT 0.6    Studies/Results: CT Abdomen Pelvis W Contrast  Result Date: 04/28/2020 CLINICAL DATA:  Abdominal pain, hernia suspected, loss of appetite and diarrhea for 3 days EXAM: CT  ABDOMEN AND PELVIS WITH CONTRAST TECHNIQUE: Multidetector CT imaging of the abdomen and pelvis was performed using the standard protocol following bolus administration of intravenous contrast. CONTRAST:  46mL OMNIPAQUE IOHEXOL 300 MG/ML  SOLN COMPARISON:  10/11/2018 FINDINGS: Lower chest: No acute abnormality. Hepatobiliary: No solid liver abnormality is seen. No gallstones, gallbladder wall thickening, or biliary dilatation. Pancreas: Unremarkable. No pancreatic ductal dilatation or surrounding inflammatory changes. Spleen: Normal in size without significant abnormality. Adrenals/Urinary Tract: Adrenal glands are unremarkable. Kidneys are normal, without renal calculi, solid lesion, or hydronephrosis. Air-fluid level within the urinary bladder. Stomach/Bowel: Stomach is within normal limits. Appendix appears normal. No evidence of bowel wall thickening, distention, or inflammatory changes. Vascular/Lymphatic: Aortic atherosclerosis. No enlarged abdominal or pelvic lymph nodes. Reproductive: No mass or other significant abnormality. Other: No abdominal wall hernia or abnormality. No abdominopelvic ascites. Musculoskeletal: No acute or significant osseous findings. IMPRESSION: 1. No acute CT findings of the abdomen or pelvis to explain abdominal pain, diarrhea, or loss of appetite. 2. Air-fluid level within the urinary bladder. Correlate for recent catheterization. Aortic Atherosclerosis (ICD10-I70.0). Electronically Signed   By: Eddie Candle M.D.   On: 04/28/2020 12:53    Impression: 75 y.o. year old male last seen by [facility] in 2014 due to heme positive stool, anemia, and melena, undergoing colonoscopy that was unrevealing and EGD with small duodenal bulbar ulcer but negative H.pylori on path and serologies, now admitted with UTI and found to have new onset anemia with HGb 9.4 on admission, previously 13 in Feb 2022. I do note Hgb was 18 in  March 2022 but query if this was correct. Today,  Hgb 8.9. B12 low normal  at 195. Ferritin elevated at 704, iron low at 38 and sats low at 16. He was actually heme negative. CT abd/pelvis with contrast without acute findings. Air-fluid level within urinary bladder.   He has had no overt GI bleeding, and diarrhea has resolved. He is declining endoscopic evaluation while inpatient and prefers to pursue as outpatient. In light of heme negative stool and stable Hgb today, we could follow him as outpatient and plan on diagnostic colonoscopy/EGD at that time. Will closely follow H/H as outpatient. May resume Eliquis as he is not interested in pursuing diagnostic procedures while inpatient.      Plan: Recommend PPI daily Follow H/H and will follow as outpatient Will arrange outpatient follow-up to schedule colonoscopy/EGD May resume Eliquis Will sign off as he is not interested in pursuing evaluation while inpatient.   Annitta Needs, PhD, ANP-BC [facility] Gastroenterology     LOS: 1 day    04/29/2020, 12:21 PM

## 2020-04-29 NOTE — Progress Notes (Signed)
PROGRESS NOTE   [name]  QQP:[id number]    DOB: 21-Jun-1945    DOA: 04/28/2020  PCP: Dettinger, Fransisca Kaufmann, MD   I have briefly reviewed patients previous medical records in [facility].  Chief Complaint  Patient presents with  . Mass    Sent from urgent care     Brief Narrative:  75 year old male with medical history significant for but not limited to COPD, atrial fibrillation, pacemaker, diabetes mellitus, hypertension, aortic valve replacement, presented to the ED on 04/28/2020 due to 2 days history of diarrhea without vomiting but with associated poor oral intake for 2 weeks.  No report of black tarry stools or blood in stools.  Intermittent dysuria of several days duration.  Reported dizziness when standing and due to weakness barely able to walk.  Seen initially at urgent care and subsequently referred to the ED.  Admitted for orthostatic hypotension, possible UTI, subacute anemia and generalized weakness.  Despite overnight IVF, hemoglobin stable in the 8.9-9 range, still has some orthostatic hypotension.  GI consulted and patient declined EGD/colonoscopy and wishes to pursue as outpatient.   Assessment & Plan:  Principal Problem:   Generalized weakness Active Problems:   Essential hypertension   MURMUR   Aortic valve disease   Anemia   Type 2 diabetes mellitus with diabetic neuropathy, unspecified (HCC)   CHF (congestive heart failure) (HCC)   Acute lower UTI   Atrial fibrillation (HCC)   S/P AVR (aortic valve replacement)   COPD (chronic obstructive pulmonary disease) (HCC)   Orthostatic hypotension   Generalized weakness  Suspected multifactorial due to dehydration from GI losses, poor oral intake, UTI and symptomatic anemia.  Physical therapy evaluation appreciated, no outpatient PT follow-up recommended.  Orthostatic hypotension  SBP in ED dropped from 170 > 109  Suspect due to poor oral intake, GI losses and low-dose Lasix at home.  S/p 2 L IVF bolus  and maintenance IV fluids overnight.  Lasix and lisinopril on hold.  Despite this, orthostatic with PT (supine BP 123/110-suspect this is an error.  However BP drop from sitting 118/56-91/47 standing).  Continue gentle IV fluids  Added lower extremity compression stockings.  Subacute anemia, iron and B12 deficiency  No report of overt GI bleeding.  Hemoglobin dropped from baseline 12-13 in February to 8.9-9 g range this admission.  FOBT negative.  Abdominal CT without evidence of bleeding.  Last colonoscopy 2014: Normal rectum and colon, repeat in 5 years.  EGD then showed inflamed gastric mucosa and 4 mm duodenal ulcer.  Hemoglobin stable compared to admission.  Anemia panel: Iron 38, TIBC 232, saturation ratio 16, ferritin 704, folate 17.4 and B12: 195.  Eliquis was held and GI was consulted for possible EGD and colonoscopy given substantial hemoglobin drop since February, need for ongoing anticoagulation for his A. fib to make sure that he does not have any lesions.    As per my discussion with Dr. Laural Golden, patient adamantly refusing endoscopic evaluation as inpatient, willing to follow-up as outpatient.  Okay to resume Eliquis and monitor.  Started B12 supplements  UTI  Patient reported dysuria on admission.  Ruled out for sepsis.  Urine microscopy concerning for UTI.  Abdominal CT showed air-fluid level in bladder.  EDP discussed with urology who recommended 2 weeks of Bactrim.  Currently on IV ceftriaxone.  Follow urine cultures.  Hypomagnesemia  Replaced.  Type II DM  A1c 8.1  Patient reported being off of his insulins due to hypoglycemic episode up to  45 mg per DL.  Also on weekly Ozempic and Metformin.  Currently on hold.  Reasonably controlled on SSI.  Chronic diastolic CHF  2D echo 02/4095: LVEF 65-70%.  Given dehydration, held Lasix.  Gentle IV fluids with close monitoring.  Chronic A. fib  Rate controlled without meds.  Continue Eliquis  with close CBC monitoring.  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  Had creatinine of 1.97 on 3/9.  Presented with creatinine of 1.44, improved to 1.24.  Follow BMP.  Body mass index is 30.87 kg/m.   DVT prophylaxis: SCDs Start: 04/28/20 2045     Code Status: Full Code Family Communication: None at bedside Disposition:  Status is: Inpatient  Remains inpatient appropriate because:Inpatient level of care appropriate due to severity of illness   Dispo: The patient is from: Home              Anticipated d/c is to: Home              Patient currently is not medically stable to d/c.   Difficult to place patient No        Consultants:   GI  Procedures:   None  Antimicrobials:    Anti-infectives (From admission, onward)   Start     Dose/Rate Route Frequency Ordered Stop   04/28/20 1815  cefTRIAXone (ROCEPHIN) 2 g in sodium chloride 0.9 % 100 mL IVPB        2 g 200 mL/hr over 30 Minutes Intravenous Every 24 hours 04/28/20 1800          Subjective:  Reports feeling better.  Stronger.  No nausea or vomiting.  Has not had BM since hospital admission.  Reports some suprapubic pain and discomfort on palpation.  Just finished eating breakfast without issues.  Objective:   Vitals:   04/28/20 2057 04/29/20 0504 04/29/20 1010 04/29/20 1308  BP: (!) 154/71 (!) 144/82 126/67 (!) 135/56  Pulse: 78 80 75 70  Resp: 20 20  19   Temp: 97.9 F (36.6 C) 98.2 F (36.8 C) (!) 97.4 F (36.3 C) 98 F (36.7 C)  TempSrc: Oral Oral Oral   SpO2: 98% 99% 99% 98%  Weight:      Height:        General exam: Elderly male, moderately built and nourished sitting up comfortably in bed.  May be somewhat hard of hearing. Respiratory system: Clear to auscultation. Respiratory effort normal. Cardiovascular system: S1 & S2 heard, irregularly irregular. No JVD, murmurs, rubs, gallops or clicks. No pedal edema. Gastrointestinal  system: Abdomen is nondistended, soft.  Minimal suprapubic tenderness without rigidity, guarding or rebound. No organomegaly or masses felt. Normal bowel sounds heard. Central nervous system: Alert and oriented. No focal neurological deficits. Extremities: Symmetric 5 x 5 power. Skin: No rashes, lesions or ulcers Psychiatry: Judgement and insight appear normal. Mood & affect appropriate.     Data Reviewed:   I have personally reviewed following labs and imaging studies   CBC: Recent Labs  Lab 04/28/20 1102 04/28/20 1316 04/29/20 0500  WBC 8.8  --  7.4  NEUTROABS 6.5  --   --   HGB 9.4* 9.0* 8.9*  HCT 29.2* 27.7* 27.5*  MCV 101.7*  --  101.9*  PLT 151  --  142*    Basic Metabolic Panel: Recent Labs  Lab 04/28/20 1102 04/29/20 0500  NA 135 138  K 4.1 4.0  CL 103 108  CO2 19*  22  GLUCOSE 242* 164*  BUN 22 16  CREATININE 1.44* 1.24  CALCIUM 8.3* 7.6*  MG 1.5* 1.8    Liver Function Tests: Recent Labs  Lab 04/28/20 1102  AST 18  ALT 10  ALKPHOS 61  BILITOT 0.6  PROT 6.6  ALBUMIN 2.9*    CBG: Recent Labs  Lab 04/28/20 2133 04/29/20 0740 04/29/20 1105  GLUCAP 185* 156* 152*    Microbiology Studies:   Recent Results (from the past 240 hour(s))  Resp Panel by RT-PCR (Flu A&B, Covid) Nasopharyngeal Swab     Status: None   Collection Time: 04/28/20  5:20 PM   Specimen: Nasopharyngeal Swab; Nasopharyngeal(NP) swabs in vial transport medium  Result Value Ref Range Status   SARS Coronavirus 2 by RT PCR NEGATIVE NEGATIVE Final    Comment: (NOTE) SARS-CoV-2 target nucleic acids are NOT DETECTED.  The SARS-CoV-2 RNA is generally detectable in upper respiratory specimens during the acute phase of infection. The lowest concentration of SARS-CoV-2 viral copies this assay can detect is 138 copies/mL. A negative result does not preclude SARS-Cov-2 infection and should not be used as the sole basis for treatment or other patient management decisions. A negative  result may occur with  improper specimen collection/handling, submission of specimen other than nasopharyngeal swab, presence of viral mutation(s) within the areas targeted by this assay, and inadequate number of viral copies(<138 copies/mL). A negative result must be combined with clinical observations, patient history, and epidemiological information. The expected result is Negative.  Fact Sheet for Patients:  EntrepreneurPulse.com.au  Fact Sheet for Healthcare Providers:  IncredibleEmployment.be  This test is no t yet approved or cleared by the [place] FDA and  has been authorized for detection and/or diagnosis of SARS-CoV-2 by FDA under an Emergency Use Authorization (EUA). This EUA will remain  in effect (meaning this test can be used) for the duration of the COVID-19 declaration under Section 564(b)(1) of the Act, 21 U.S.C.section 360bbb-3(b)(1), unless the authorization is terminated  or revoked sooner.       Influenza A by PCR NEGATIVE NEGATIVE Final   Influenza B by PCR NEGATIVE NEGATIVE Final    Comment: (NOTE) The Xpert Xpress SARS-CoV-2/FLU/RSV plus assay is intended as an aid in the diagnosis of influenza from Nasopharyngeal swab specimens and should not be used as a sole basis for treatment. Nasal washings and aspirates are unacceptable for Xpert Xpress SARS-CoV-2/FLU/RSV testing.  Fact Sheet for Patients: EntrepreneurPulse.com.au  Fact Sheet for Healthcare Providers: IncredibleEmployment.be  This test is not yet approved or cleared by the [place] FDA and has been authorized for detection and/or diagnosis of SARS-CoV-2 by FDA under an Emergency Use Authorization (EUA). This EUA will remain in effect (meaning this test can be used) for the duration of the COVID-19 declaration under Section 564(b)(1) of the Act, 21 U.S.C. section 360bbb-3(b)(1), unless the authorization is  terminated or revoked.  Performed at [facility], [address] [place], [place] [address]      Radiology Studies:  CT Abdomen Pelvis W Contrast  Result Date: 04/28/2020 CLINICAL DATA:  Abdominal pain, hernia suspected, loss of appetite and diarrhea for 3 days EXAM: CT ABDOMEN AND PELVIS WITH CONTRAST TECHNIQUE: Multidetector CT imaging of the abdomen and pelvis was performed using the standard protocol following bolus administration of intravenous contrast. CONTRAST:  50mL OMNIPAQUE IOHEXOL 300 MG/ML  SOLN COMPARISON:  10/11/2018 FINDINGS: Lower chest: No acute abnormality. Hepatobiliary: No solid liver abnormality is seen. No gallstones, gallbladder wall thickening, or biliary  dilatation. Pancreas: Unremarkable. No pancreatic ductal dilatation or surrounding inflammatory changes. Spleen: Normal in size without significant abnormality. Adrenals/Urinary Tract: Adrenal glands are unremarkable. Kidneys are normal, without renal calculi, solid lesion, or hydronephrosis. Air-fluid level within the urinary bladder. Stomach/Bowel: Stomach is within normal limits. Appendix appears normal. No evidence of bowel wall thickening, distention, or inflammatory changes. Vascular/Lymphatic: Aortic atherosclerosis. No enlarged abdominal or pelvic lymph nodes. Reproductive: No mass or other significant abnormality. Other: No abdominal wall hernia or abnormality. No abdominopelvic ascites. Musculoskeletal: No acute or significant osseous findings. IMPRESSION: 1. No acute CT findings of the abdomen or pelvis to explain abdominal pain, diarrhea, or loss of appetite. 2. Air-fluid level within the urinary bladder. Correlate for recent catheterization. Aortic Atherosclerosis (ICD10-I70.0). Electronically Signed   By: Eddie Candle M.D.   On: 04/28/2020 12:53     Scheduled Meds:   . apixaban  5 mg Oral BID  . gabapentin  300 mg Oral BID  . insulin aspart  0-15 Units Subcutaneous TID WC  . insulin aspart  0-5 Units  Subcutaneous QHS  . levothyroxine  175 mcg Oral QAC breakfast  . pantoprazole  40 mg Oral QAC breakfast    Continuous Infusions:   . cefTRIAXone (ROCEPHIN)  IV 2 g (04/28/20 1846)     LOS: 1 day     Vernell Leep, MD, [place], [facility]. [facility]    To contact the attending provider between 7A-7P or the covering provider during after hours 7P-7A, please log into the web site [email or web address] and access using universal [place] password for that web site. If you do not have the password, please call the hospital operator.  04/29/2020, 3:07 PM

## 2020-04-29 NOTE — Evaluation (Signed)
Physical Therapy Evaluation Patient Details Name: [name] MRN: [id number] DOB: [birth year] Today's Date: 04/29/2020   History of Present Illness  [name] is a 75 y.o. male with medical history significant for  COPD, [place] fib + pace maker, diabetes mellitus, hypertension, aortic valve replacement.  Patient presented to the ED with complaints of generalized weakness over the past 2 weeks.  Reports 2 days of watery stools, 2 watery stools yesterday none today.  No vomiting.  Reports poor oral intake for 2 weeks.  He is unaware of any black stools or blood in stools (though he did not look at his stool color).  Reports left lower quadrant abdominal pain.  He also reports intermittent pain with urination over the past several days.  Reports dizziness when standing, and says due to weakness is barely able to walk.  No fevers no chills.  No cough no difficulty breathing.  No chest pain.    Clinical Impression  Patient functioning near baseline for functional mobility and gait demonstrating good return for transfers and ambulation in room/hallway without loss of balance, limited mostly due to fatigue and tolerated sitting up in chair after therapy.  Patient's orthostatics as follows:  Supine 123/110, sitting 118/56, standing 91/47, sitting after ambulation 94/75 - RN notified.  Patient will benefit from continued physical therapy in hospital and recommended venue below to increase strength, balance, endurance for safe ADLs and gait.     Follow Up Recommendations No PT follow up;Supervision - Intermittent    Equipment Recommendations  None recommended by PT    Recommendations for Other Services       Precautions / Restrictions Precautions Precautions: None Restrictions Weight Bearing Restrictions: No      Mobility  Bed Mobility Overal bed mobility: Modified Independent                  Transfers Overall transfer level: Needs assistance   Transfers: Sit to/from  Stand;Stand Pivot Transfers Sit to Stand: Supervision Stand pivot transfers: Supervision       General transfer comment: slightly labored movement  Ambulation/Gait Ambulation/Gait assistance: Supervision Gait Distance (Feet): 70 Feet Assistive device: None Gait Pattern/deviations: Decreased step length - right;Decreased step length - left;Decreased stride length Gait velocity: decreased   General Gait Details: slightly labored unsteady cadence without loss of balance, limited mostly due to fatigue  Stairs            Wheelchair Mobility    Modified Rankin (Stroke Patients Only)       Balance Overall balance assessment: Mild deficits observed, not formally tested                                           Pertinent Vitals/Pain Pain Assessment: No/denies pain    Home Living Family/patient expects to be discharged to:: Private residence Living Arrangements: Alone Available Help at Discharge: Family;Available PRN/intermittently Type of Home: Mobile home Home Access: Stairs to enter Entrance Stairs-Rails: None Entrance Stairs-Number of Steps: 1 Home Layout: One level Home Equipment: Walker - 2 wheels      Prior Function Level of Independence: Independent         Comments: household and short distanced community ambulator, drives     Hand Dominance   Dominant Hand: Right    Extremity/Trunk Assessment   Upper Extremity Assessment Upper Extremity Assessment: Overall WFL for tasks assessed  Lower Extremity Assessment Lower Extremity Assessment: Overall WFL for tasks assessed    Cervical / Trunk Assessment Cervical / Trunk Assessment: Kyphotic  Communication   Communication: No difficulties  Cognition Arousal/Alertness: Awake/alert Behavior During Therapy: WFL for tasks assessed/performed Overall Cognitive Status: Within Functional Limits for tasks assessed                                        General  Comments      Exercises     Assessment/Plan    PT Assessment Patient needs continued PT services  PT Problem List Decreased strength;Decreased activity tolerance;Decreased balance;Decreased mobility       PT Treatment Interventions Gait training;Stair training;Functional mobility training;Therapeutic activities;Therapeutic exercise;DME instruction;Patient/family education;Balance training    PT Goals (Current goals can be found in the Care Plan section)  Acute Rehab PT Goals Patient Stated Goal: return home PT Goal Formulation: With patient Time For Goal Achievement: 05/01/20 Potential to Achieve Goals: Good    Frequency Min 2X/week   Barriers to discharge        Co-evaluation               AM-PAC PT "6 Clicks" Mobility  Outcome Measure Help needed turning from your back to your side while in a flat bed without using bedrails?: None Help needed moving from lying on your back to sitting on the side of a flat bed without using bedrails?: None Help needed moving to and from a bed to a chair (including a wheelchair)?: None Help needed standing up from a chair using your arms (e.g., wheelchair or bedside chair)?: None Help needed to walk in hospital room?: A Little Help needed climbing 3-5 steps with a railing? : A Little 6 Click Score: 22    End of Session   Activity Tolerance: Patient tolerated treatment well;Patient limited by fatigue Patient left: in chair;with call bell/phone within reach Nurse Communication: Mobility status PT Visit Diagnosis: Unsteadiness on feet (R26.81);Other abnormalities of gait and mobility (R26.89);Muscle weakness (generalized) (M62.81)    Time: 4650-3546 PT Time Calculation (min) (ACUTE ONLY): 32 min   Charges:   PT Evaluation $PT Eval Moderate Complexity: 1 Mod PT Treatments $Therapeutic Activity: 23-37 mins        2:05 PM, 04/29/20 Lonell Grandchild, MPT Physical Therapist with [facility] 336 [phone]  office [phone] mobile phone

## 2020-04-29 NOTE — H&P (View-Only) (Signed)
Referring Provider: Dr. Algis Liming  Primary Care Physician:  Dettinger, Fransisca Kaufmann, MD Primary Gastroenterologist:  Dr. Gala Romney   Date of Admission: 04/28/20 Date of Consultation: 04/29/20  Reason for Consultation:  Anemia in setting of anticoagulation, heme negative   HPI:  [name] is a 75 y.o. year old male last seen by [facility] in 2014 due to heme positive stool, anemia, and melena. He underwent colonoscopy in 2014 that was unrevealing, and EGD showed small duodenal bulbar ulcer. Negative H.pylori. He was lost to follow-up thereafter. Presented to the ED yesterday from Urgent Care eval due to diarrhea and concern for abdominal mass. On Eliquis with history of afib. Hgb found to be 9.4, down from 13 in Feb 2022. I do note Hgb was 18 in  March 2022 but query if this was correct. Today, Hgb 8.9. B12 low normal at 195. Ferritin elevated at 704, iron low at 38 and sats low at 16. He was actually heme negative. CT abd/pelvis with contrast without acute findings. Air-fluid level within urinary bladder. Admitted with weakness, orthostatic hypotension, acute anemia, UTI.   Patient states he had 3 loose stools yesterday and burning with urination, so he presented to the Urgent Care. Denies abdominal pain. No overt GI bleeding. Has a good appetite. No dysphagia. Takes Tylenol. No NSAIDs. No prior changes in bowel habits prior to yesterday. Believes he lost 7 lbs in last 2-4 weeks. Denies fatigue, chest pain, shortness of breath.   Does not want to pursue endoscopic evaluation while inpatient. He desires to pursue this as outpatient.     Past Medical History:  Diagnosis Date  . Arthritis   . Atrial fibrillation ([place])   . BPH (benign prostatic hypertrophy) 04/15/2010  . Cataract   . Colon polyps   . DEPRESSION 01/22/2009  . Heart valve replaced   . HYPERCHOLESTEROLEMIA 01/22/2009  . HYPERTENSION 01/22/2009   Dr. [name]  . 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 Morbid  obesity ([place])   . 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 Tremors of nervous system    ?ptsd  . Ulcer   . Vitreous hemorrhage of left eye ([place]) 08/27/2019    Past Surgical History:  Procedure Laterality Date  . 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 APPENDECTOMY    . bilateral cataract surg    . COLONOSCOPY  04/24/2007   [name]: normal  . COLONOSCOPY WITH ESOPHAGOGASTRODUODENOSCOPY (EGD) N/A 04/05/2012   normal rectum, somewhat elongated and redundant colon, otherwise normal. Remote history of colon polyps. EGD: normal esophagus, bile-stained gastric mucosa, diffuse patchy gastric erythema, small duodenal bulbar ulcer with associated erosions. Negative H.pylori. H.pylori serology also negative.   . DENTAL SURGERY  05/2004   Dental extractions  . EYE SURGERY Bilateral    cataract  . HEEL SPUR SURGERY Bilateral    resection of heel spur  . KNEE ARTHROSCOPY WITH LATERAL MENISECTOMY Right 04/04/2014   Procedure: KNEE ARTHROSCOPY WITH LATERAL MENISECTOMY;  Surgeon: Carole Civil, MD;  Location: [facility];  Service: Orthopedics;  Laterality: Right;  . PACEMAKER IMPLANT N/A 06/13/2016   Procedure: Pacemaker Implant- Dual Chamber;  Surgeon: Evans Lance, MD;  Location: [facility];  Service: Cardiovascular;  Laterality: N/A;  . PACEMAKER INSERTION  2018  . THYROIDECTOMY  03/21/2013   DR [name]  . THYROIDECTOMY N/A 03/21/2013   Procedure: THYROIDECTOMY;  Surgeon: [name], MD;  Location: [place];  Service: General;  Laterality: N/A;  . TONSILLECTOMY      Prior to Admission medications   Medication Sig Start Date End Date Taking? Authorizing Provider  acetaminophen (TYLENOL) 500 MG tablet Take 1,000 mg by mouth every 6 (six) hours as needed (pain).   Yes [provider]  apixaban (ELIQUIS) 5 MG TABS tablet Take 1 tablet (5 mg total) by mouth 2 (two) times daily. 02/17/20  Yes Dettinger, Fransisca Kaufmann, MD   cholecalciferol (VITAMIN D) 1000 units tablet Take 1,000 Units by mouth daily.    Yes [provider]  furosemide (LASIX) 20 MG tablet Take 1 tablet (20 mg total) by mouth daily. 04/17/17  Yes Timmothy Euler, MD  gabapentin (NEURONTIN) 300 MG capsule Take 1 capsule (300 mg total) by mouth 2 (two) times daily. 02/17/20  Yes Dettinger, Fransisca Kaufmann, MD  hydrocortisone cream 1 % Apply 1 application topically 2 (two) times daily. 04/06/20  Yes Ivy Lynn, NP  levothyroxine (SYNTHROID) 175 MCG tablet Take 1 tablet (175 mcg total) by mouth daily before breakfast. 02/17/20  Yes Dettinger, Fransisca Kaufmann, MD  lisinopril (ZESTRIL) 5 MG tablet Take 5 mg by mouth daily.   Yes [provider]  loperamide (IMODIUM A-D) 2 MG tablet Take 1 tablet (2 mg total) by mouth 4 (four) times daily as needed for diarrhea or loose stools. 03/19/20  Yes Dettinger, Fransisca Kaufmann, MD  metFORMIN (GLUCOPHAGE) 1000 MG tablet TAKE 1 TABLET BY MOUTH TWICE DAILY WITH A MEAL. Patient taking differently: Take 1,000 mg by mouth in the morning and at bedtime. 07/24/15  Yes Timmothy Euler, MD  Semaglutide, 1 MG/DOSE, (OZEMPIC, 1 MG/DOSE,) 2 MG/1.5ML SOPN Inject 1 mg into the skin once a week. 02/17/20  Yes Dettinger, Fransisca Kaufmann, MD  simvastatin (ZOCOR) 80 MG tablet Take 0.5 tablets (40 mg total) by mouth at bedtime. 04/04/17  Yes Timmothy Euler, MD  glucose blood test strip 1 strip by Does not apply route 3 (three) times daily. Uses true metrix strips (diabetic club) 05/12/16   [provider]  insulin detemir (LEVEMIR FLEXTOUCH) 100 UNIT/ML FlexPen Inject 35-40 Units into the skin 2 (two) times daily. Patient taking differently: Inject 30 Units into the skin 2 (two) times daily. 02/17/20   Dettinger, Fransisca Kaufmann, MD  miconazole nitrate (MICATIN) POWD Apply 1 application topically 2 (two) times daily. Patient not taking: No sig reported 04/06/20   Ivy Lynn, NP  Multiple Vitamins-Minerals (MENS MULTI VITAMIN &  MINERAL PO) Take 1 tablet by mouth daily.   Patient not taking: Reported on 04/28/2020    [provider]  mupirocin ointment (BACTROBAN) 2 % Apply 1 application topically 2 (two) times daily. Patient not taking: No sig reported 04/23/20   Dettinger, Fransisca Kaufmann, MD  tamsulosin (FLOMAX) 0.4 MG CAPS capsule TAKE TWO CAPSULES BY MOUTH AT BEDTIME Patient not taking: No sig reported 03/16/20   Dettinger, Fransisca Kaufmann, MD  traMADol (ULTRAM) 50 MG tablet Take 50 mg by mouth 2 (two) times daily. Patient not taking: Reported on 04/28/2020 08/09/18   [provider]    Current Facility-Administered Medications  Medication Dose Route Frequency Provider Last Rate Last Admin  . 0.9 %  sodium chloride infusion   Intravenous Continuous Emokpae, Ejiroghene E, MD 100 mL/hr at 04/29/20 0726 New Bag at 04/29/20 0726  . acetaminophen (TYLENOL) tablet 650 mg  650 mg Oral Q6H PRN Emokpae, Ejiroghene E, MD   650 mg at 04/29/20 0725   Or  .  acetaminophen (TYLENOL) suppository 650 mg  650 mg Rectal Q6H PRN Emokpae, Ejiroghene E, MD      . apixaban (ELIQUIS) tablet 5 mg  5 mg Oral BID Emokpae, Ejiroghene E, MD   5 mg at 04/28/20 2141  . cefTRIAXone (ROCEPHIN) 2 g in sodium chloride 0.9 % 100 mL IVPB  2 g Intravenous Q24H Emokpae, Ejiroghene E, MD 200 mL/hr at 04/28/20 1846 2 g at 04/28/20 1846  . gabapentin (NEURONTIN) capsule 300 mg  300 mg Oral BID Emokpae, Ejiroghene E, MD   300 mg at 04/29/20 0957  . insulin aspart (novoLOG) injection 0-15 Units  0-15 Units Subcutaneous TID WC Emokpae, Ejiroghene E, MD   3 Units at 04/29/20 0747  . insulin aspart (novoLOG) injection 0-5 Units  0-5 Units Subcutaneous QHS Emokpae, Ejiroghene E, MD      . levothyroxine (SYNTHROID) tablet 175 mcg  175 mcg Oral QAC breakfast Emokpae, Ejiroghene E, MD   175 mcg at 04/29/20 0504    Allergies as of 04/28/2020 - Review Complete 04/28/2020  Allergen Reaction Noted  . Phenothiazines Anaphylaxis 04/15/2010  . Actos [pioglitazone]  Swelling 04/03/2009    Family History  Problem Relation Age of Onset  . Heart disease Father   . Congestive Heart Failure Father   . Arthritis Father   . Diabetes Other   . Benign prostatic hyperplasia Brother   . Colon cancer Neg Hx   . Colon polyps Neg Hx     Social History   Socioeconomic History  . Marital status: Single    Spouse name: Not on file  . Number of children: 1  . Years of education: HS  . Highest education level: Not on file  Occupational History  . Occupation: Administrator, retired    Fish farm manager: RETIRED  Tobacco Use  . Smoking status: Former Smoker    Packs/day: 0.00    Years: 12.00    Pack years: 0.00    Types: Cigarettes    Quit date: 09/20/1961    Years since quitting: 58.6  . Smokeless tobacco: Never Used  Vaping Use  . Vaping Use: Never used  Substance and Sexual Activity  . Alcohol use: Not Currently  . Drug use: No  . Sexual activity: Never    Comment: Has not smoked in 20 years  Other Topics Concern  . Not on file  Social History Narrative   Lives alone.   Quit smoking approximately 28 years ago.   Long haul truck driver, lives in [place].   Social Determinants of Health   Financial Resource Strain: Not on file  Food Insecurity: Not on file  Transportation Needs: Not on file  Physical Activity: Not on file  Stress: Not on file  Social Connections: Not on file  Intimate Partner Violence: Not on file    Review of Systems: Gen: Denies fever, chills, loss of appetite, change in weight or weight loss CV: Denies chest pain, heart palpitations, syncope, edema  Resp: Denies shortness of breath with rest, cough, wheezing GI: see HPI GU : see HPI MS: Denies joint pain,swelling, cramping Derm: Denies rash, itching, dry skin Psych: Denies depression, anxiety,confusion, or memory loss Heme: Denies bruising, bleeding, and enlarged lymph nodes.  Physical Exam: Vital signs in last 24 hours: Temp:  [97.4 F (36.3 C)-98.7 F (37.1 C)]  97.4 F (36.3 C) (04/06 1010) Pulse Rate:  [70-80] 75 (04/06 1010) Resp:  [13-21] 20 (04/06 0504) BP: (109-165)/(43-82) 126/67 (04/06 1010) SpO2:  [95 %-100 %] 99 % (04/06  1010) Last BM Date: 04/28/20 General:   Alert,  Well-developed, well-nourished, pleasant and cooperative in NAD, sitting up in chair Head:  Normocephalic and atraumatic. Eyes:  Sclera clear, no icterus.   Conjunctiva pink. Ears:  Extremely hard of hearing Nose:  No deformity, discharge,  or lesions. Mouth:  No deformity or lesions, dentition normal. Lungs:  Clear throughout to auscultation.   Heart:  S1 S2 present with systolic murmur Abdomen:  Sitting up in chair. Right-sided abdomen more protuberant than left, no TTP. Normal bowel sounds, without guarding, and without rebound.   Rectal:  Deferred until time of colonoscopy.   Msk:  Symmetrical without gross deformities. Normal posture. Extremities:  Without  edema. Neurologic:  Alert and  oriented x4 Skin:  Intact without significant lesions or rashes. Cervical Nodes:  No significant cervical adenopathy. Psych:  Alert and cooperative. Normal mood and affect.  Intake/Output from previous day: 04/05 0701 - 04/06 0700 In: 2680.1 [I.V.:530.1; IV Piggyback:2150] Out: 600 [Urine:600] Intake/Output this shift: Total I/O In: 240 [P.O.:240] Out: -   Lab Results: Recent Labs    04/28/20 1102 04/28/20 1316 04/29/20 0500  WBC 8.8  --  7.4  HGB 9.4* 9.0* 8.9*  HCT 29.2* 27.7* 27.5*  PLT 151  --  142*   BMET Recent Labs    04/28/20 1102 04/29/20 0500  NA 135 138  K 4.1 4.0  CL 103 108  CO2 19* 22  GLUCOSE 242* 164*  BUN 22 16  CREATININE 1.44* 1.24  CALCIUM 8.3* 7.6*   LFT Recent Labs    04/28/20 1102  PROT 6.6  ALBUMIN 2.9*  AST 18  ALT 10  ALKPHOS 61  BILITOT 0.6    Studies/Results: CT Abdomen Pelvis W Contrast  Result Date: 04/28/2020 CLINICAL DATA:  Abdominal pain, hernia suspected, loss of appetite and diarrhea for 3 days EXAM: CT  ABDOMEN AND PELVIS WITH CONTRAST TECHNIQUE: Multidetector CT imaging of the abdomen and pelvis was performed using the standard protocol following bolus administration of intravenous contrast. CONTRAST:  13mL OMNIPAQUE IOHEXOL 300 MG/ML  SOLN COMPARISON:  10/11/2018 FINDINGS: Lower chest: No acute abnormality. Hepatobiliary: No solid liver abnormality is seen. No gallstones, gallbladder wall thickening, or biliary dilatation. Pancreas: Unremarkable. No pancreatic ductal dilatation or surrounding inflammatory changes. Spleen: Normal in size without significant abnormality. Adrenals/Urinary Tract: Adrenal glands are unremarkable. Kidneys are normal, without renal calculi, solid lesion, or hydronephrosis. Air-fluid level within the urinary bladder. Stomach/Bowel: Stomach is within normal limits. Appendix appears normal. No evidence of bowel wall thickening, distention, or inflammatory changes. Vascular/Lymphatic: Aortic atherosclerosis. No enlarged abdominal or pelvic lymph nodes. Reproductive: No mass or other significant abnormality. Other: No abdominal wall hernia or abnormality. No abdominopelvic ascites. Musculoskeletal: No acute or significant osseous findings. IMPRESSION: 1. No acute CT findings of the abdomen or pelvis to explain abdominal pain, diarrhea, or loss of appetite. 2. Air-fluid level within the urinary bladder. Correlate for recent catheterization. Aortic Atherosclerosis (ICD10-I70.0). Electronically Signed   By: Eddie Candle M.D.   On: 04/28/2020 12:53    Impression: 75 y.o. year old male last seen by [facility] in 2014 due to heme positive stool, anemia, and melena, undergoing colonoscopy that was unrevealing and EGD with small duodenal bulbar ulcer but negative H.pylori on path and serologies, now admitted with UTI and found to have new onset anemia with HGb 9.4 on admission, previously 13 in Feb 2022. I do note Hgb was 18 in  March 2022 but query if this was correct. Today,  Hgb 8.9. B12 low normal  at 195. Ferritin elevated at 704, iron low at 38 and sats low at 16. He was actually heme negative. CT abd/pelvis with contrast without acute findings. Air-fluid level within urinary bladder.   He has had no overt GI bleeding, and diarrhea has resolved. He is declining endoscopic evaluation while inpatient and prefers to pursue as outpatient. In light of heme negative stool and stable Hgb today, we could follow him as outpatient and plan on diagnostic colonoscopy/EGD at that time. Will closely follow H/H as outpatient. May resume Eliquis as he is not interested in pursuing diagnostic procedures while inpatient.      Plan: Recommend PPI daily Follow H/H and will follow as outpatient Will arrange outpatient follow-up to schedule colonoscopy/EGD May resume Eliquis Will sign off as he is not interested in pursuing evaluation while inpatient.   Annitta Needs, PhD, ANP-BC [facility]     LOS: 1 day    04/29/2020, 12:21 PM

## 2020-04-29 NOTE — Plan of Care (Signed)

## 2020-04-30 ENCOUNTER — Telehealth: Payer: Self-pay | Admitting: Gastroenterology

## 2020-04-30 DIAGNOSIS — D649 Anemia, unspecified: Secondary | ICD-10-CM | POA: Diagnosis not present

## 2020-04-30 LAB — GLUCOSE, CAPILLARY
Glucose-Capillary: 168 mg/dL — ABNORMAL HIGH (ref 70–99)
Glucose-Capillary: 179 mg/dL — ABNORMAL HIGH (ref 70–99)
Glucose-Capillary: 178 mg/dL — ABNORMAL HIGH (ref 70–99)
Glucose-Capillary: 190 mg/dL — ABNORMAL HIGH (ref 70–99)

## 2020-04-30 LAB — BASIC METABOLIC PANEL
Anion gap: 9 (ref 5–15)
BUN: 12 mg/dL (ref 8–23)
CO2: 21 mmol/L — ABNORMAL LOW (ref 22–32)
Calcium: 7.6 mg/dL — ABNORMAL LOW (ref 8.9–10.3)
Chloride: 109 mmol/L (ref 98–111)
Creatinine, Ser: 1.26 mg/dL — ABNORMAL HIGH (ref 0.61–1.24)
GFR, Estimated: 59 mL/min — ABNORMAL LOW (ref >60)
Glucose, Bld: 175 mg/dL — ABNORMAL HIGH (ref 70–99)
Potassium: 3.6 mmol/L (ref 3.5–5.1)
Sodium: 139 mmol/L (ref 135–145)

## 2020-04-30 LAB — CBC
HCT: 26.9 % — ABNORMAL LOW (ref 39.0–52.0)
Hemoglobin: 8.5 g/dL — ABNORMAL LOW (ref 13.0–17.0)
MCH: 32.6 pg (ref 26.0–34.0)
MCHC: 31.6 g/dL (ref 30.0–36.0)
MCV: 103.1 fL — ABNORMAL HIGH (ref 80.0–100.0)
Platelets: 130 10*3/uL — ABNORMAL LOW (ref 150–400)
RBC: 2.61 MIL/uL — ABNORMAL LOW (ref 4.22–5.81)
RDW: 17.7 % — ABNORMAL HIGH (ref 11.5–15.5)
WBC: 7.4 10*3/uL (ref 4.0–10.5)
nRBC: 0 % (ref 0.0–0.2)

## 2020-04-30 MED ORDER — VITAMIN B-12 1000 MCG PO TABS
500.0000 ug | ORAL_TABLET | Freq: Every day | ORAL | Status: DC
  Administered 2020-04-30 – 2020-05-02 (×3): 500 ug via ORAL
  Filled 2020-04-30 (×3): qty 1

## 2020-04-30 MED ORDER — BISACODYL 5 MG PO TBEC
10.0000 mg | DELAYED_RELEASE_TABLET | Freq: Once | ORAL | Status: AC
  Administered 2020-04-30: 10 mg via ORAL
  Filled 2020-04-30: qty 2

## 2020-04-30 MED ORDER — BISACODYL 5 MG PO TBEC
10.0000 mg | DELAYED_RELEASE_TABLET | ORAL | Status: AC
  Administered 2020-04-30: 10 mg via ORAL
  Filled 2020-04-30: qty 2

## 2020-04-30 MED ORDER — PEG 3350-KCL-NA BICARB-NACL 420 G PO SOLR
4000.0000 mL | Freq: Once | ORAL | Status: AC
  Administered 2020-04-30: 4000 mL via ORAL

## 2020-04-30 NOTE — Telephone Encounter (Signed)
Please arrange for hospital follow up.   Please arrange for H/H in 10 days.

## 2020-04-30 NOTE — Progress Notes (Signed)
Subjective:  Patient without complaints. Interested in having inpatient work up with colonoscopy and possible upper endoscopy.  Objective: Vital signs in last 24 hours: Temp:  [98 F (36.7 C)-98.9 F (37.2 C)] 98.9 F (37.2 C) (04/07 1300) Pulse Rate:  [69-92] 70 (04/07 1300) Resp:  [16-20] 20 (04/07 1300) BP: (106-176)/(47-74) 176/72 (04/07 1300) SpO2:  [97 %-100 %] 98 % (04/07 1300) Last BM Date: 04/28/20 General:   Alert,  Well-developed, well-nourished, pleasant and cooperative in NAD Head:  Normocephalic and atraumatic. Eyes:  Sclera clear, no icterus.  Chest: CTA bilaterally without rales, rhonchi, crackles.    Heart:  Regular rate and rhythm; no murmurs, clicks, rubs,  or gallops. Abdomen:  Soft, nontender and nondistended. No masses, hepatosplenomegaly or hernias noted. Normal bowel sounds, without guarding, and without rebound.   Neurologic:  Alert and  oriented x4;  grossly normal neurologically. Skin:  Intact without significant lesions or rashes. Psych:  Alert and cooperative. Normal mood and affect.  Intake/Output from previous day: 04/06 0701 - 04/07 0700 In: 2865.9 [P.O.:600; I.V.:2165.9; IV Piggyback:100] Out: 900 [Urine:900] Intake/Output this shift: Total I/O In: 480 [P.O.:480] Out: 750 [Urine:750]  Lab Results: CBC Recent Labs    04/28/20 1102 04/28/20 1316 04/29/20 0500 04/30/20 0538  WBC 8.8  --  7.4 7.4  HGB 9.4* 9.0* 8.9* 8.5*  HCT 29.2* 27.7* 27.5* 26.9*  MCV 101.7*  --  101.9* 103.1*  PLT 151  --  142* 130*   BMET Recent Labs    04/28/20 1102 04/29/20 0500 04/30/20 0538  NA 135 138 139  K 4.1 4.0 3.6  CL 103 108 109  CO2 19* 22 21*  GLUCOSE 242* 164* 175*  BUN 22 16 12   CREATININE 1.44* 1.24 1.26*  CALCIUM 8.3* 7.6* 7.6*   LFTs Recent Labs    04/28/20 1102  BILITOT 0.6  ALKPHOS 61  AST 18  ALT 10  PROT 6.6  ALBUMIN 2.9*   Recent Labs    04/28/20 1102  LIPASE 24   PT/INR No results for input(s): LABPROT, INR in the  last 72 hours.    Imaging Studies: DG Chest 2 View  Result Date: 04/01/2020 CLINICAL DATA:  Fall EXAM: CHEST - 2 VIEW COMPARISON:  April 30, 2019. FINDINGS: The heart size and mediastinal contours are within normal limits. Aortic atherosclerosis. Left subclavian approach cardiac rhythm maintenance device. Median sternotomy. Both lungs are clear. No visible pleural effusions or pneumothorax. No acute osseous abnormality. Clips in the lower neck. IMPRESSION: No active cardiopulmonary disease. Electronically Signed   By: Margaretha Sheffield MD   On: 04/01/2020 13:45   CT Head Wo Contrast  Result Date: 04/01/2020 CLINICAL DATA:  Head trauma. Right eye pain with redness and drainage. EXAM: CT HEAD WITHOUT CONTRAST CT MAXILLOFACIAL WITHOUT CONTRAST CT CERVICAL SPINE WITHOUT CONTRAST TECHNIQUE: Multidetector CT imaging of the head, cervical spine, and maxillofacial structures were performed using the standard protocol without intravenous contrast. Multiplanar CT image reconstructions of the cervical spine and maxillofacial structures were also generated. COMPARISON:  CT head February 2022. FINDINGS: CT HEAD FINDINGS Brain: No evidence of acute infarction, hemorrhage, hydrocephalus, extra-axial collection or mass lesion/mass effect. Similar generalized cerebral volume loss with ex vacuo ventricular dilation. Similar mild patchy white matter hypoattenuation, most likely related to chronic microvascular ischemic disease. Similar prominent retro cerebellar CSF, which may represent arachnoid cyst or dilated mega cisterna magna. Vascular: Calcific atherosclerosis. No hyperdense vessel identified. Skull: No acute fracture. Other: No mastoid effusions. CT MAXILLOFACIAL FINDINGS Osseous:  No fracture or mandibular dislocation. No destructive process. Orbits: Mild right preseptal periorbital soft tissue stranding/edema. No postseptal hemorrhage. The globes appear unremarkable and symmetric. Normal position of the lens. Similar  size of the anterior chamber bilaterally. No proptosis. Sinuses: Frothy secretions in bilateral sphenoid sinuses. Mild ethmoid air cell and maxillary sinus mucosal thickening. Atretic frontal sinuses. Soft tissues: Right periorbital soft tissue findings are detailed above. CT CERVICAL SPINE FINDINGS Alignment: Reversal of the normal cervical lordosis. Slight (2 mm) of anterolisthesis of C3 on C4) likely degenerative given degenerative changes at this level. Otherwise, no substantial sagittal subluxation. Broad dextrocurvature. Skull base and vertebrae: Vertebral body heights are maintained. Motion limited evaluation without evidence of acute fracture. Soft tissues and spinal canal: No prevertebral fluid or swelling. No visible canal hematoma. Disc levels: Mild multilevel degenerative change and multilevel facet arthropathy. No high-grade bony canal stenosis. Upper chest: Visualized lung apices are clear. Other: Evidence of prior thyroidectomy. IMPRESSION: CT head: No evidence of acute intracranial abnormality. CT maxillofacial: 1. Mild right preseptal periorbital soft tissue stranding/edema, likely secondary to reported contusion/trauma. No postseptal hemorrhage or evidence of acute fracture. The globes appear unremarkable and symmetric. 2. Frothy secretions in bilateral sphenoid sinuses with mild ethmoid and maxillary sinus mucosal thickening CT cervical spine: No evidence of acute fracture or traumatic malalignment. Electronically Signed   By: Margaretha Sheffield MD   On: 04/01/2020 14:06   CT Cervical Spine Wo Contrast  Result Date: 04/01/2020 CLINICAL DATA:  Head trauma. Right eye pain with redness and drainage. EXAM: CT HEAD WITHOUT CONTRAST CT MAXILLOFACIAL WITHOUT CONTRAST CT CERVICAL SPINE WITHOUT CONTRAST TECHNIQUE: Multidetector CT imaging of the head, cervical spine, and maxillofacial structures were performed using the standard protocol without intravenous contrast. Multiplanar CT image reconstructions  of the cervical spine and maxillofacial structures were also generated. COMPARISON:  CT head February 2022. FINDINGS: CT HEAD FINDINGS Brain: No evidence of acute infarction, hemorrhage, hydrocephalus, extra-axial collection or mass lesion/mass effect. Similar generalized cerebral volume loss with ex vacuo ventricular dilation. Similar mild patchy white matter hypoattenuation, most likely related to chronic microvascular ischemic disease. Similar prominent retro cerebellar CSF, which may represent arachnoid cyst or dilated mega cisterna magna. Vascular: Calcific atherosclerosis. No hyperdense vessel identified. Skull: No acute fracture. Other: No mastoid effusions. CT MAXILLOFACIAL FINDINGS Osseous: No fracture or mandibular dislocation. No destructive process. Orbits: Mild right preseptal periorbital soft tissue stranding/edema. No postseptal hemorrhage. The globes appear unremarkable and symmetric. Normal position of the lens. Similar size of the anterior chamber bilaterally. No proptosis. Sinuses: Frothy secretions in bilateral sphenoid sinuses. Mild ethmoid air cell and maxillary sinus mucosal thickening. Atretic frontal sinuses. Soft tissues: Right periorbital soft tissue findings are detailed above. CT CERVICAL SPINE FINDINGS Alignment: Reversal of the normal cervical lordosis. Slight (2 mm) of anterolisthesis of C3 on C4) likely degenerative given degenerative changes at this level. Otherwise, no substantial sagittal subluxation. Broad dextrocurvature. Skull base and vertebrae: Vertebral body heights are maintained. Motion limited evaluation without evidence of acute fracture. Soft tissues and spinal canal: No prevertebral fluid or swelling. No visible canal hematoma. Disc levels: Mild multilevel degenerative change and multilevel facet arthropathy. No high-grade bony canal stenosis. Upper chest: Visualized lung apices are clear. Other: Evidence of prior thyroidectomy. IMPRESSION: CT head: No evidence of  acute intracranial abnormality. CT maxillofacial: 1. Mild right preseptal periorbital soft tissue stranding/edema, likely secondary to reported contusion/trauma. No postseptal hemorrhage or evidence of acute fracture. The globes appear unremarkable and symmetric. 2. Frothy secretions in bilateral  sphenoid sinuses with mild ethmoid and maxillary sinus mucosal thickening CT cervical spine: No evidence of acute fracture or traumatic malalignment. Electronically Signed   By: Margaretha Sheffield MD   On: 04/01/2020 14:06   CT Abdomen Pelvis W Contrast  Result Date: 04/28/2020 CLINICAL DATA:  Abdominal pain, hernia suspected, loss of appetite and diarrhea for 3 days EXAM: CT ABDOMEN AND PELVIS WITH CONTRAST TECHNIQUE: Multidetector CT imaging of the abdomen and pelvis was performed using the standard protocol following bolus administration of intravenous contrast. CONTRAST:  58mL OMNIPAQUE IOHEXOL 300 MG/ML  SOLN COMPARISON:  10/11/2018 FINDINGS: Lower chest: No acute abnormality. Hepatobiliary: No solid liver abnormality is seen. No gallstones, gallbladder wall thickening, or biliary dilatation. Pancreas: Unremarkable. No pancreatic ductal dilatation or surrounding inflammatory changes. Spleen: Normal in size without significant abnormality. Adrenals/Urinary Tract: Adrenal glands are unremarkable. Kidneys are normal, without renal calculi, solid lesion, or hydronephrosis. Air-fluid level within the urinary bladder. Stomach/Bowel: Stomach is within normal limits. Appendix appears normal. No evidence of bowel wall thickening, distention, or inflammatory changes. Vascular/Lymphatic: Aortic atherosclerosis. No enlarged abdominal or pelvic lymph nodes. Reproductive: No mass or other significant abnormality. Other: No abdominal wall hernia or abnormality. No abdominopelvic ascites. Musculoskeletal: No acute or significant osseous findings. IMPRESSION: 1. No acute CT findings of the abdomen or pelvis to explain abdominal pain,  diarrhea, or loss of appetite. 2. Air-fluid level within the urinary bladder. Correlate for recent catheterization. Aortic Atherosclerosis (ICD10-I70.0). Electronically Signed   By: Eddie Candle M.D.   On: 04/28/2020 12:53   DG Knee Complete 4 Views Right  Result Date: 04/01/2020 CLINICAL DATA:  Fall yesterday with right knee pain EXAM: RIGHT KNEE - COMPLETE 4+ VIEW COMPARISON:  02/04/2014 right knee radiographs FINDINGS: No fracture, dislocation, joint effusion or suspicious focal osseous lesions. Moderate superior and inferior right patellar enthesophytes. Small tibial tubercle enthesophyte. Minimal lateral compartment right knee osteoarthritis. Preserved patellofemoral and medial compartments. No radiopaque foreign bodies. Vascular calcifications in the posterior soft tissues. IMPRESSION: 1. No right knee fracture, joint effusion or malalignment. 2. Minimal lateral compartment right knee osteoarthritis. 3. Patellar and tibial tubercle enthesophytes. Electronically Signed   By: Ilona Sorrel M.D.   On: 04/01/2020 13:44   CUP PACEART REMOTE DEVICE CHECK  Result Date: 04/14/2020 Scheduled remote reviewed. Normal device function.  Next remote 91 days- JBox, RN/CVRS  Intravitreal Injection, Pharmacologic Agent - OD - Right Eye  Result Date: 04/20/2020 Time Out 04/20/2020. 4:28 PM. Confirmed correct patient, procedure, site, and patient consented. Anesthesia Topical anesthesia was used. Anesthetic medications included Akten 3.5%. Procedure Preparation included Ofloxacin , 10% betadine to eyelids, 5% betadine to ocular surface. A 30 gauge needle was used. Injection: 2.5 mg Bevacizumab (AVASTIN) 2.5mg /0.20mL SOSY   NDC: 44034-742-59, Lot: [id number]   Route: Intravitreal, Site: Right Eye Post-op Post injection exam found visual acuity of at least counting fingers. The patient tolerated the procedure well. There were no complications. The patient received written and verbal post procedure care education. Post  injection medications were not given.   OCT, Retina - OU - Both Eyes  Result Date: 04/20/2020 Right Eye Quality was good. Scan locations included subfoveal. Central Foveal Thickness: 302. Progression has worsened. Findings include subretinal fluid, abnormal foveal contour. Left Eye Quality was good. Scan locations included subfoveal. Central Foveal Thickness: 275. Progression has been stable. Findings include retinal drusen , abnormal foveal contour. 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  Only on OCT do we see this OD OS no signs of active disease  OCT, Retina - OU - Both Eyes  Result Date: 04/14/2020 Right Eye Quality was good. Scan locations included subfoveal. Central Foveal Thickness: 304. Progression has worsened. Findings include subretinal fluid, abnormal foveal contour. Left Eye Quality was good. Scan locations included subfoveal. Central Foveal Thickness: 278. Progression has been stable. Findings include retinal drusen , abnormal foveal contour. 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  Only on OCT do we see this OD OS no signs of active disease  Fluorescein Angiography Optos (Transit OD)  Result Date: 04/20/2020 Injection: 500 mg Fluorescein Sodium 10 % injection   NDC: [phone]   Route: Intravenous, Site: Right ArmRight Eye Early phase findings include window defect, leakage. Mid/Late phase findings include leakage, choroidal neovascularization. Choroidal neovascularization is peripapillary. Left Eye Choroidal neovascularization is not present. Notes OD with multifocal diffuse leakages in the perifoveal area suggestive of minor CME from diabetic retinopathy.  No overt CSME however.  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 OS good focal laser  photocoagulation.  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 Macula : microaneurysms. Vessels : IRMA. Left Eye Progression has no prior data. Disc findings include normal observations. Macula : microaneurysms. Vessels : IRMA. Notes Moderate scatter PRP OU.  Good focal laser photocoagulation.  CT Maxillofacial Wo Contrast  Result Date: 04/01/2020 CLINICAL DATA:  Head trauma. Right eye pain with redness and drainage. EXAM: CT HEAD WITHOUT CONTRAST CT MAXILLOFACIAL WITHOUT CONTRAST CT CERVICAL SPINE WITHOUT CONTRAST TECHNIQUE: Multidetector CT imaging of the head, cervical spine, and maxillofacial structures were performed using the standard protocol without intravenous contrast. Multiplanar CT image reconstructions of the cervical spine and maxillofacial structures were also generated. COMPARISON:  CT head February 2022. FINDINGS: CT HEAD FINDINGS Brain: No evidence of acute infarction, hemorrhage, hydrocephalus, extra-axial collection or mass lesion/mass effect. Similar generalized cerebral volume loss with ex vacuo ventricular dilation. Similar mild patchy white matter hypoattenuation, most likely related to chronic microvascular ischemic disease. Similar prominent retro cerebellar CSF, which may represent arachnoid cyst or dilated mega cisterna magna. Vascular: Calcific atherosclerosis. No hyperdense vessel identified. Skull: No acute fracture. Other: No mastoid effusions. CT MAXILLOFACIAL FINDINGS Osseous: No fracture or mandibular dislocation. No destructive process. Orbits: Mild right preseptal periorbital soft tissue stranding/edema. No postseptal hemorrhage. The globes appear unremarkable and symmetric. Normal position of the lens. Similar size of the anterior chamber bilaterally. No proptosis. Sinuses: Frothy secretions in bilateral sphenoid sinuses. Mild ethmoid air cell and  maxillary sinus mucosal thickening. Atretic frontal sinuses. Soft tissues: Right periorbital soft tissue findings are detailed above. CT CERVICAL SPINE FINDINGS Alignment: Reversal of the normal cervical lordosis. Slight (2 mm) of anterolisthesis of C3 on C4) likely degenerative given degenerative changes at this level. Otherwise, no substantial sagittal subluxation. Broad dextrocurvature. Skull base and vertebrae: Vertebral body heights are maintained. Motion limited evaluation without evidence of acute fracture. Soft tissues and spinal canal: No prevertebral fluid or swelling. No visible canal hematoma. Disc levels: Mild multilevel degenerative change and multilevel facet arthropathy. No high-grade bony canal stenosis. Upper chest: Visualized lung apices are clear. Other: Evidence of prior thyroidectomy. IMPRESSION: CT head: No evidence of acute intracranial abnormality. CT maxillofacial: 1. Mild right preseptal periorbital  soft tissue stranding/edema, likely secondary to reported contusion/trauma. No postseptal hemorrhage or evidence of acute fracture. The globes appear unremarkable and symmetric. 2. Frothy secretions in bilateral sphenoid sinuses with mild ethmoid and maxillary sinus mucosal thickening CT cervical spine: No evidence of acute fracture or traumatic malalignment. Electronically Signed   By: Margaretha Sheffield MD   On: 04/01/2020 14:06  [2 weeks]   Assessment: 75 year old male with history of A. fib, aortic valve replacement, pacemaker, hypertension, diabetes mellitus, morbid obesity last seen by [facility] in 2014 due to heme positive stool, anemia, melena, undergoing colonoscopy that was unrevealing and EGD with small duodenal bulbar ulcer but negative H. pylori on path and serologies, now admitted with UTI and found to have new onset anemia with hemoglobin 9.4 on admission, previously 13 in February 2022. Yesterday hemoglobin 8.9, B12 low normal at 195. Ferritin elevated at 704, iron low at 38, sats  low at 16%.  Heme-negative.  CT abdomen pelvis with contrast without acute findings.   Patient denies overt GI bleeding.  States diarrhea has resolved. He has had complaints of poor appetite recently with 7 pound weight loss in past few weeks. Initially declined endoscopic evaluation while inpatient but Dr. Algis Liming states that patient has now decided on pursuing inpatient work-up.  Patient chronically anticoagulated for A. fib with Eliquis. Had been on hold but received one dose last night after patient declined inpatient work up.   Plan: 1. Discussed case with anesthesia, appropriate for colonoscopy with possible upper endoscopy tomorrow. Will make arrangements.  I have discussed the risks, alternatives, benefits with regards to but not limited to the risk of reaction to medication, bleeding, infection, perforation and the patient is agreeable to proceed. Written consent to be obtained. 2. Hold Eliquis.   Laureen Ochs. Bernarda Caffey [facility] [phone] 4/7/20225:10 PM     LOS: 2 days

## 2020-04-30 NOTE — Progress Notes (Signed)
PROGRESS NOTE   [name]  WUG:[id number]    DOB: 1945/07/22    DOA: 04/28/2020  PCP: Dettinger, Fransisca Kaufmann, MD   I have briefly reviewed patients previous medical records in [facility].  Chief Complaint  Patient presents with  . Mass    Sent from urgent care     Brief Narrative:  75 year old male with medical history significant for but not limited to COPD, atrial fibrillation, pacemaker, diabetes mellitus, hypertension, aortic valve replacement, presented to the ED on 04/28/2020 due to 2 days history of diarrhea without vomiting but with associated poor oral intake for 2 weeks.  No report of black tarry stools or blood in stools.  Intermittent dysuria of several days duration.  Reported dizziness when standing and due to weakness barely able to walk.  Seen initially at urgent care and subsequently referred to the ED.  Admitted for orthostatic hypotension, possible UTI, subacute anemia and generalized weakness.  Despite overnight IVF, hemoglobin stable in the 8.9-9 range, still has some orthostatic hypotension.  GI consulted and patient declined EGD/colonoscopy on 4/6 and wished to pursue as outpatient.  Hemoglobin further dropped to 8.5, patient now agreeable for GI procedures, GI updated.   Assessment & Plan:  Principal Problem:   Generalized weakness Active Problems:   Essential hypertension   MURMUR   Aortic valve disease   Anemia   Type 2 diabetes mellitus with diabetic neuropathy, unspecified (HCC)   CHF (congestive heart failure) (HCC)   Acute lower UTI   Atrial fibrillation (HCC)   S/P AVR (aortic valve replacement)   COPD (chronic obstructive pulmonary disease) (HCC)   Orthostatic hypotension   Generalized weakness  Suspected multifactorial due to dehydration from GI losses, poor oral intake, UTI and symptomatic anemia.  Physical therapy evaluation appreciated, no outpatient PT follow-up recommended.  Persistently orthostatic given today.  See management  below.  Orthostatic hypotension  SBP in ED dropped from 170 > 109  Suspect due to poor oral intake, GI losses and low-dose Lasix at home.  S/p 2 L IVF bolus and maintenance IV fluids overnight.  Lasix and lisinopril on hold.  Despite this, orthostatic with PT (supine BP 123/110-suspect this is an error.  However BP drop from sitting 118/56-91/47 standing).  Clinically euvolemic.  Discontinued IV fluids.  Added lower extremity compression stockings-does not seem to have them on, requested RN to place.  Remains persistently orthostatic (supine BP 164/67, sitting 150/72, standing 106/47).  Subacute anemia, iron and B12 deficiency  No report of overt GI bleeding.  Hemoglobin dropped from baseline 12-13 in February to 8.9-9 g range this admission.  FOBT negative.  Abdominal CT without evidence of bleeding.  Last colonoscopy 2014: Normal rectum and colon, repeat in 5 years.  EGD then showed inflamed gastric mucosa and 4 mm duodenal ulcer.  Hemoglobin stable compared to admission.  Anemia panel: Iron 38, TIBC 232, saturation ratio 16, ferritin 704, folate 17.4 and B12: 195.  Eliquis was held and GI was consulted for possible EGD and colonoscopy given substantial hemoglobin drop since February, need for ongoing anticoagulation for his A. fib to make sure that he does not have any lesions.    As per my discussion with Dr. Laural Golden 4/6, patient adamantly refusing endoscopic evaluation as inpatient, willing to follow-up as outpatient.  Okay to resume Eliquis and monitor.  Started B12 supplements  Hemoglobin has further drifted down to 8.5 today.  Today agreeable to GI procedures.  Held morning dose of Eliquis pending GI  input-GI was updated.  Thrombocytopenia  Unclear etiology.  Follow daily CBC.  Klebsiella pneumonia UTI  Patient reported dysuria on admission.  Ruled out for sepsis.  Urine microscopy concerning for UTI.  Abdominal CT showed air-fluid level in bladder.  EDP  discussed with urology who recommended 2 weeks of Bactrim.  Currently on day 3 of IV ceftriaxone.  Urine culture showed >100 K colonies of Klebsiella, susceptibilities pending.  Hypomagnesemia  Replaced.  Type II DM  A1c 8.1  Patient reported being off of his insulins due to hypoglycemic episode up to 45 mg per DL.  Also on weekly Ozempic and Metformin.  Currently on hold.  Reasonably controlled on SSI.  Chronic diastolic CHF  2D echo 0/1749: LVEF 65-70%.  Given dehydration, held Lasix.  Stop IV fluids.  Chronic A. fib  Rate controlled without meds.  Held a.m. dose of Eliquis to see if work-up will be done by GI.  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  Had creatinine of 1.97 on 3/9.  Presented with creatinine of 1.44, improved to 1.24 >1.26.  Follow BMP.  Body mass index is 30.87 kg/m.   DVT prophylaxis: Place TED hose Start: 04/29/20 1530 SCDs Start: 04/28/20 2045     Code Status: Full Code Family Communication: None at bedside Disposition:  Status is: Inpatient  Remains inpatient appropriate because:Inpatient level of care appropriate due to severity of illness   Dispo: The patient is from: Home              Anticipated d/c is to: Home              Patient currently is not medically stable to d/c.   Difficult to place patient No        Consultants:   GI  Procedures:   None  Antimicrobials:    Anti-infectives (From admission, onward)   Start     Dose/Rate Route Frequency Ordered Stop   04/28/20 1815  cefTRIAXone (ROCEPHIN) 2 g in sodium chloride 0.9 % 100 mL IVPB        2 g 200 mL/hr over 30 Minutes Intravenous Every 24 hours 04/28/20 1800          Subjective:  Patient interviewed and examined along with RN at bedside.  Denies complaints.  No dizziness or lightheadedness reported.  Has not had a BM since admission.  No nausea or vomiting.  Suprapubic pain/dysuria resolved.   States he has had significant weight loss over the last month, unintentional.  Objective:   Vitals:   04/30/20 1128 04/30/20 1130 04/30/20 1131 04/30/20 1300  BP: (!) 164/67 (!) 150/72 (!) 106/47 (!) 176/72  Pulse: 69 76 79 70  Resp: 16 16 18 20   Temp: 98.4 F (36.9 C) 98 F (36.7 C) 98 F (36.7 C) 98.9 F (37.2 C)  TempSrc: Oral Oral Oral   SpO2: 99% 100% 100% 98%  Weight:      Height:        General exam: Elderly male, moderately built and nourished sitting up comfortably in bed.  May be somewhat hard of hearing. Respiratory system: Clear to auscultation.  No increased work of breathing. Cardiovascular system: S1 and S2 heard, RRR.  No JVD, murmurs or pedal edema. Gastrointestinal system: Abdomen is nondistended, soft and nontender.  No organomegaly or masses appreciated.  Normal bowel sounds heard. Central nervous system: Alert and oriented. No focal neurological deficits. Extremities: Symmetric 5 x 5  power. Skin: No rashes, lesions or ulcers Psychiatry: Judgement and insight may be somewhat impaired and possibly has memory impairment issues. Mood & affect appropriate.     Data Reviewed:   I have personally reviewed following labs and imaging studies   CBC: Recent Labs  Lab 04/28/20 1102 04/28/20 1316 04/29/20 0500 04/30/20 0538  WBC 8.8  --  7.4 7.4  NEUTROABS 6.5  --   --   --   HGB 9.4* 9.0* 8.9* 8.5*  HCT 29.2* 27.7* 27.5* 26.9*  MCV 101.7*  --  101.9* 103.1*  PLT 151  --  142* 130*    Basic Metabolic Panel: Recent Labs  Lab 04/28/20 1102 04/29/20 0500 04/30/20 0538  NA 135 138 139  K 4.1 4.0 3.6  CL 103 108 109  CO2 19* 22 21*  GLUCOSE 242* 164* 175*  BUN 22 16 12   CREATININE 1.44* 1.24 1.26*  CALCIUM 8.3* 7.6* 7.6*  MG 1.5* 1.8  --     Liver Function Tests: Recent Labs  Lab 04/28/20 1102  AST 18  ALT 10  ALKPHOS 61  BILITOT 0.6  PROT 6.6  ALBUMIN 2.9*    CBG: Recent Labs  Lab 04/29/20 2038 04/30/20 0746 04/30/20 1103   GLUCAP 183* 168* 179*    Microbiology Studies:   Recent Results (from the past 240 hour(s))  Resp Panel by RT-PCR (Flu A&B, Covid) Nasopharyngeal Swab     Status: None   Collection Time: 04/28/20  5:20 PM   Specimen: Nasopharyngeal Swab; Nasopharyngeal(NP) swabs in vial transport medium  Result Value Ref Range Status   SARS Coronavirus 2 by RT PCR NEGATIVE NEGATIVE Final    Comment: (NOTE) SARS-CoV-2 target nucleic acids are NOT DETECTED.  The SARS-CoV-2 RNA is generally detectable in upper respiratory specimens during the acute phase of infection. The lowest concentration of SARS-CoV-2 viral copies this assay can detect is 138 copies/mL. A negative result does not preclude SARS-Cov-2 infection and should not be used as the sole basis for treatment or other patient management decisions. A negative result may occur with  improper specimen collection/handling, submission of specimen other than nasopharyngeal swab, presence of viral mutation(s) within the areas targeted by this assay, and inadequate number of viral copies(<138 copies/mL). A negative result must be combined with clinical observations, patient history, and epidemiological information. The expected result is Negative.  Fact Sheet for Patients:  EntrepreneurPulse.com.au  Fact Sheet for Healthcare Providers:  IncredibleEmployment.be  This test is no t yet approved or cleared by the [place] FDA and  has been authorized for detection and/or diagnosis of SARS-CoV-2 by FDA under an Emergency Use Authorization (EUA). This EUA will remain  in effect (meaning this test can be used) for the duration of the COVID-19 declaration under Section 564(b)(1) of the Act, 21 U.S.C.section 360bbb-3(b)(1), unless the authorization is terminated  or revoked sooner.       Influenza A by PCR NEGATIVE NEGATIVE Final   Influenza B by PCR NEGATIVE NEGATIVE Final    Comment: (NOTE) The Xpert  Xpress SARS-CoV-2/FLU/RSV plus assay is intended as an aid in the diagnosis of influenza from Nasopharyngeal swab specimens and should not be used as a sole basis for treatment. Nasal washings and aspirates are unacceptable for Xpert Xpress SARS-CoV-2/FLU/RSV testing.  Fact Sheet for Patients: EntrepreneurPulse.com.au  Fact Sheet for Healthcare Providers: IncredibleEmployment.be  This test is not yet approved or cleared by the [place] FDA and has been authorized for detection and/or diagnosis of SARS-CoV-2 by  FDA under an Emergency Use Authorization (EUA). This EUA will remain in effect (meaning this test can be used) for the duration of the COVID-19 declaration under Section 564(b)(1) of the Act, 21 U.S.C. section 360bbb-3(b)(1), unless the authorization is terminated or revoked.  Performed at [facility], [address] [place], [place] [address]   Culture, Urine     Status: Abnormal (Preliminary result)   Collection Time: 04/28/20  6:58 PM   Specimen: Urine, Clean Catch  Result Value Ref Range Status   Specimen Description   Final    URINE, CLEAN CATCH Performed at [facility], [address] [place], [place] [address]    Special Requests   Final    NONE Performed at [facility], [address] [place], [place] [address]    Culture (A)  Final    >=100,000 COLONIES/mL KLEBSIELLA PNEUMONIAE SUSCEPTIBILITIES TO FOLLOW Performed at [facility] [address], [place], [place] [address]    Report Status PENDING  Incomplete     Radiology Studies:  No results found.   Scheduled Meds:   . apixaban  5 mg Oral BID  . gabapentin  300 mg Oral BID  . insulin aspart  0-15 Units Subcutaneous TID WC  . insulin aspart  0-5 Units Subcutaneous QHS  . levothyroxine  175 mcg Oral QAC breakfast  . pantoprazole  40 mg Oral QAC breakfast    Continuous Infusions:   . cefTRIAXone (ROCEPHIN)  IV 2 g (04/29/20 1631)     LOS: 2  days     Vernell Leep, MD, [place], [facility]. [facility]    To contact the attending provider between 7A-7P or the covering provider during after hours 7P-7A, please log into the web site [email or web address] and access using universal [place] password for that web site. If you do not have the password, please call the hospital operator.  04/30/2020, 1:50 PM

## 2020-04-30 NOTE — Plan of Care (Signed)

## 2020-04-30 NOTE — Plan of Care (Signed)
  Problem: Education: Goal: Knowledge of General Education information will improve Description: Including pain rating scale, medication(s)/side effects and non-pharmacologic comfort measures 04/30/2020 1347 by Melony Overly, RN Outcome: Progressing 04/30/2020 0801 by Melony Overly, RN Outcome: Progressing   Problem: Health Behavior/Discharge Planning: Goal: Ability to manage health-related needs will improve 04/30/2020 1347 by Melony Overly, RN Outcome: Progressing 04/30/2020 0801 by Melony Overly, RN Outcome: Progressing   Problem: Clinical Measurements: Goal: Ability to maintain clinical measurements within normal limits will improve 04/30/2020 1347 by Melony Overly, RN Outcome: Progressing 04/30/2020 0801 by Melony Overly, RN Outcome: Progressing Goal: Will remain free from infection 04/30/2020 1347 by Melony Overly, RN Outcome: Progressing 04/30/2020 0801 by Melony Overly, RN Outcome: Progressing Goal: Diagnostic test results will improve 04/30/2020 1347 by Melony Overly, RN Outcome: Progressing 04/30/2020 0801 by Melony Overly, RN Outcome: Progressing Goal: Respiratory complications will improve 04/30/2020 1347 by Melony Overly, RN Outcome: Progressing 04/30/2020 0801 by Melony Overly, RN Outcome: Progressing Goal: Cardiovascular complication will be avoided 04/30/2020 1347 by Melony Overly, RN Outcome: Progressing 04/30/2020 0801 by Melony Overly, RN Outcome: Progressing   Problem: Activity: Goal: Risk for activity intolerance will decrease 04/30/2020 1347 by Melony Overly, RN Outcome: Progressing 04/30/2020 0801 by Melony Overly, RN Outcome: Progressing   Problem: Nutrition: Goal: Adequate nutrition will be maintained 04/30/2020 1347 by Melony Overly, RN Outcome: Progressing 04/30/2020 0801 by Melony Overly, RN Outcome: Progressing   Problem: Coping: Goal: Level of anxiety will decrease 04/30/2020 1347 by Melony Overly, RN Outcome:  Progressing 04/30/2020 0801 by Melony Overly, RN Outcome: Progressing   Problem: Elimination: Goal: Will not experience complications related to bowel motility 04/30/2020 1347 by Melony Overly, RN Outcome: Progressing 04/30/2020 0801 by Melony Overly, RN Outcome: Progressing Goal: Will not experience complications related to urinary retention 04/30/2020 1347 by Melony Overly, RN Outcome: Progressing 04/30/2020 0801 by Melony Overly, RN Outcome: Progressing   Problem: Pain Managment: Goal: General experience of comfort will improve 04/30/2020 1347 by Melony Overly, RN Outcome: Progressing 04/30/2020 0801 by Melony Overly, RN Outcome: Progressing   Problem: Safety: Goal: Ability to remain free from injury will improve 04/30/2020 1347 by Melony Overly, RN Outcome: Progressing 04/30/2020 0801 by Melony Overly, RN Outcome: Progressing   Problem: Skin Integrity: Goal: Risk for impaired skin integrity will decrease 04/30/2020 1347 by Melony Overly, RN Outcome: Progressing 04/30/2020 0801 by Melony Overly, RN Outcome: Progressing

## 2020-05-01 ENCOUNTER — Encounter (HOSPITAL_COMMUNITY): Payer: Self-pay | Admitting: Internal Medicine

## 2020-05-01 ENCOUNTER — Inpatient Hospital Stay (HOSPITAL_COMMUNITY): Payer: No Typology Code available for payment source | Admitting: Certified Registered Nurse Anesthetist

## 2020-05-01 ENCOUNTER — Encounter: Payer: Self-pay | Admitting: Internal Medicine

## 2020-05-01 ENCOUNTER — Encounter (HOSPITAL_COMMUNITY)
Admission: EM | Disposition: A | Discharge status: Home / Self Care | Payer: Self-pay | Source: Home / Self Care | Attending: Internal Medicine

## 2020-05-01 DIAGNOSIS — D509 Iron deficiency anemia, unspecified: Secondary | ICD-10-CM

## 2020-05-01 DIAGNOSIS — K2970 Gastritis, unspecified, without bleeding: Secondary | ICD-10-CM

## 2020-05-01 HISTORY — PX: BIOPSY: SHX5522

## 2020-05-01 HISTORY — PX: COLONOSCOPY WITH PROPOFOL: SHX5780

## 2020-05-01 HISTORY — PX: ESOPHAGOGASTRODUODENOSCOPY (EGD) WITH PROPOFOL: SHX5813

## 2020-05-01 LAB — GLUCOSE, CAPILLARY
Glucose-Capillary: 156 mg/dL — ABNORMAL HIGH (ref 70–99)
Glucose-Capillary: 149 mg/dL — ABNORMAL HIGH (ref 70–99)
Glucose-Capillary: 148 mg/dL — ABNORMAL HIGH (ref 70–99)
Glucose-Capillary: 186 mg/dL — ABNORMAL HIGH (ref 70–99)

## 2020-05-01 LAB — BASIC METABOLIC PANEL
Anion gap: 9 (ref 5–15)
BUN: 9 mg/dL (ref 8–23)
CO2: 24 mmol/L (ref 22–32)
Calcium: 8 mg/dL — ABNORMAL LOW (ref 8.9–10.3)
Chloride: 107 mmol/L (ref 98–111)
Creatinine, Ser: 1.12 mg/dL (ref 0.61–1.24)
GFR, Estimated: >60 mL/min (ref >60)
Glucose, Bld: 177 mg/dL — ABNORMAL HIGH (ref 70–99)
Potassium: 4 mmol/L (ref 3.5–5.1)
Sodium: 140 mmol/L (ref 135–145)

## 2020-05-01 LAB — CBC
HCT: 28.5 % — ABNORMAL LOW (ref 39.0–52.0)
Hemoglobin: 9.1 g/dL — ABNORMAL LOW (ref 13.0–17.0)
MCH: 32.7 pg (ref 26.0–34.0)
MCHC: 31.9 g/dL (ref 30.0–36.0)
MCV: 102.5 fL — ABNORMAL HIGH (ref 80.0–100.0)
Platelets: 145 10*3/uL — ABNORMAL LOW (ref 150–400)
RBC: 2.78 MIL/uL — ABNORMAL LOW (ref 4.22–5.81)
RDW: 17.3 % — ABNORMAL HIGH (ref 11.5–15.5)
WBC: 8.1 10*3/uL (ref 4.0–10.5)
nRBC: 0 % (ref 0.0–0.2)

## 2020-05-01 SURGERY — COLONOSCOPY WITH PROPOFOL
Anesthesia: General

## 2020-05-01 MED ORDER — PROPOFOL 10 MG/ML IV BOLUS
INTRAVENOUS | Status: DC | PRN
  Administered 2020-05-01 (×2): 50 mg via INTRAVENOUS

## 2020-05-01 MED ORDER — APIXABAN 5 MG PO TABS
5.0000 mg | ORAL_TABLET | Freq: Two times a day (BID) | ORAL | Status: DC
  Administered 2020-05-01 – 2020-05-02 (×2): 5 mg via ORAL
  Filled 2020-05-01 (×2): qty 1

## 2020-05-01 MED ORDER — LACTATED RINGERS IV SOLN
INTRAVENOUS | Status: DC
  Administered 2020-05-01: 1000 mL via INTRAVENOUS

## 2020-05-01 MED ORDER — SODIUM CHLORIDE 0.9 % IV SOLN: INTRAVENOUS | Status: DC

## 2020-05-01 MED ORDER — PROPOFOL 500 MG/50ML IV EMUL
INTRAVENOUS | Status: DC | PRN
  Administered 2020-05-01: 150 ug/kg/min via INTRAVENOUS

## 2020-05-01 NOTE — Progress Notes (Signed)
Briefly evaluated patient prior to colonoscopy +/- EGD today. History of A. fib, aortic valve replacement, pacemaker, hypertension, diabetes mellitus, morbid obesity last seen by [facility] in 2014 due to heme positive stool, anemia, melena, undergoing colonoscopy that was unrevealing and EGD with small duodenal bulbar ulcer but negative H. pylori on path and serologies, now admitted with UTI and found to have new onset anemia with hemoglobin 9.4 on admission, previously 55 in February 2022. Today, Hgb 9.1. B12 low normal at 195. Ferritin elevated at 704, iron low at 38, sats low at 16%.  Heme-negative.  CT abdomen pelvis with contrast without acute findings.   Chronically anticoagulated on Eliquis prior to admission, receiving last dose 4/6 in evening. This has been on hold since that time.   Drank nearly all of prep, tolerating well. Repeat tap water enema now.  Discussed risks and benefits with stated understanding.    Annitta Needs, PhD, ANP-BC [facility]

## 2020-05-01 NOTE — Op Note (Signed)
[facility] Patient Name: [name] Procedure Date: 05/01/2020 3:24 PM MRN: [id number] Date of Birth: 05/11/1945 Attending MD: Elon Alas. Abbey Chatters DO CSN: [id number] Age: 75 Admit Type: Inpatient Procedure:                Colonoscopy Indications:              Iron deficiency anemia Providers:                Elon Alas. Abbey Chatters, DO, Rometta Emery RN, RN,                            Nelma Rothman, Technician Referring MD:              Medicines:                See the Anesthesia note for documentation of the                            administered medications Complications:            No immediate complications. Estimated Blood Loss:     Estimated blood loss: none. Procedure:                Pre-Anesthesia Assessment:                           - The anesthesia plan was to use monitored                            anesthesia care (MAC).                           After obtaining informed consent, the colonoscope                            was passed under direct vision. Throughout the                            procedure, the patient's blood pressure, pulse, and                            oxygen saturations were monitored continuously. The                            [id number]) was introduced through the anus                            and advanced to the the cecum, identified by                            appendiceal orifice and ileocecal valve. The                            colonoscopy was performed without difficulty. The                            patient tolerated the procedure well. The quality  of the bowel preparation was evaluated using the                            BBPS [facility] Bowel Preparation Scale) with scores                            of: Right Colon = 1 (portion of mucosa seen, but                            other areas not well seen due to staining, residual                            stool and/or opaque liquid), Transverse Colon =  2                            (minor amount of residual staining, small fragments                            of stool and/or opaque liquid, but mucosa seen                            well) and Left Colon = 2 (minor amount of residual                            staining, small fragments of stool and/or opaque                            liquid, but mucosa seen well). The total BBPS score                            equals 5. The quality of the bowel preparation was                            inadequate. Scope In: 3:24:05 PM Scope Out: 3:34:07 PM Scope Withdrawal Time: 0 hours 5 minutes 14 seconds  Total Procedure Duration: 0 hours 10 minutes 2 seconds  Findings:      The perianal and digital rectal examinations were normal.      Internal hemorrhoids were found during endoscopy.      A large amount of food material/stool was found in the ascending colon       and in the cecum, precluding visualization. This was unable to be       suctioned or lavaged. Impression:               - Preparation of the colon was inadequate.                           - Internal hemorrhoids.                           - Stool in the ascending colon and in the cecum.                           -  No specimens collected. Moderate Sedation:      Per Anesthesia Care Recommendation:           - Return patient to hospital ward for ongoing care.                           - No identifiable source for GI bleeding found.                            Patient will need repeat colonoscopy in the                            outpatient setting as the R side of his colon was                            not adequately prepped. Okay to resume Eliquis this                            evening. We will follow up on biopsies. Follow up                            with GI in clinic. Procedure Code(s):        --- Professional ---                           [id number], Colonoscopy, flexible; diagnostic, including                            collection  of specimen(s) by brushing or washing,                            when performed (separate procedure) Diagnosis Code(s):        --- Professional ---                           K64.8, Other hemorrhoids                           D50.9, Iron deficiency anemia, unspecified CPT copyright 2019 American Medical Association. All rights reserved. The codes documented in this report are preliminary and upon coder review may  be revised to meet current compliance requirements. Elon Alas. Abbey Chatters, DO [place] Abbey Chatters, DO 05/01/2020 3:42:41 PM This report has been signed electronically. Number of Addenda: 0

## 2020-05-01 NOTE — Interval H&P Note (Signed)
History and Physical Interval Note:  05/01/2020 3:07 PM  [name]  has presented today for surgery, with the diagnosis of acute anemia, poor appetite, weight loss.  The various methods of treatment have been discussed with the patient and family. After consideration of risks, benefits and other options for treatment, the patient has consented to  Procedure(s): COLONOSCOPY WITH PROPOFOL (N/A) ESOPHAGOGASTRODUODENOSCOPY (EGD) WITH PROPOFOL (N/A) as a surgical intervention.  The patient's history has been reviewed, patient examined, no change in status, stable for surgery.  I have reviewed the patient's chart and labs.  Questions were answered to the patient's satisfaction.     Eloise Harman

## 2020-05-01 NOTE — Progress Notes (Signed)
PROGRESS NOTE   [name]  QQI:[id number]    DOB: 1945-02-12    DOA: 04/28/2020  PCP: Dettinger, Fransisca Kaufmann, MD   I have briefly reviewed patients previous medical records in [facility].  Chief Complaint  Patient presents with  . Mass    Sent from urgent care     Brief Narrative:  75 year old male with medical history significant for but not limited to COPD, atrial fibrillation, pacemaker, diabetes mellitus, hypertension, aortic valve replacement, presented to the ED on 04/28/2020 due to 2 days history of diarrhea without vomiting but with associated poor oral intake for 2 weeks.  No report of black tarry stools or blood in stools.  Intermittent dysuria of several days duration.  Reported dizziness when standing and due to weakness barely able to walk.  Seen initially at urgent care and subsequently referred to the ED.  Admitted for orthostatic hypotension, possible UTI, subacute anemia and generalized weakness.  Despite overnight IVF, hemoglobin stable in the 8.9-9 range, still has some orthostatic hypotension.  GI consulted and patient declined EGD/colonoscopy on 4/6 and wished to pursue as outpatient.  Hemoglobin further dropped to 8.5, patient now agreeable for GI procedures, GI updated.  Currently in endoscopy for EGD/colonoscopy.   Assessment & Plan:  Principal Problem:   Generalized weakness Active Problems:   Essential hypertension   MURMUR   Aortic valve disease   Anemia   Type 2 diabetes mellitus with diabetic neuropathy, unspecified (HCC)   CHF (congestive heart failure) (HCC)   Acute lower UTI   Atrial fibrillation (HCC)   S/P AVR (aortic valve replacement)   COPD (chronic obstructive pulmonary disease) (HCC)   Orthostatic hypotension   Generalized weakness  Suspected multifactorial due to dehydration from GI losses, poor oral intake, UTI and symptomatic anemia.  Physical therapy follow-up appreciated, no outpatient PT follow-up recommended.  Continues to be  orthostatic but asymptomatic.  Orthostatic hypotension  SBP in ED dropped from 170 > 109  Suspect due to poor oral intake, GI losses and low-dose Lasix at home.  S/p 2 L IVF bolus and maintenance IV fluids overnight.  Lasix and lisinopril on hold.  Clinically euvolemic.  Discontinued IV fluids.  Continue lower extremity compression stockings, has them on now.  Ongoing persistent orthostatic hypotension; (Supine 176/75, sitting 170/79, lying 120/58). Seems asymptomatic. Avoiding Midodrine d/t supine hypertension.  Subacute anemia, iron and B12 deficiency  No report of overt GI bleeding.  Hemoglobin dropped from baseline 12-13 in February to 8.9-9 g range this admission.  FOBT negative.  Abdominal CT without evidence of bleeding.  Last colonoscopy 2014: Normal rectum and colon, repeat in 5 years.  EGD then showed inflamed gastric mucosa and 4 mm duodenal ulcer.  Hemoglobin stable compared to admission.  Anemia panel: Iron 38, TIBC 232, saturation ratio 16, ferritin 704, folate 17.4 and B12: 195.  Eliquis was held and GI was consulted for possible EGD and colonoscopy given substantial hemoglobin drop since February, need for ongoing anticoagulation for his A. fib to make sure that he does not have any lesions.    As per my discussion with Dr. Laural Golden 4/6, patient adamantly refusing endoscopic evaluation as inpatient, willing to follow-up as outpatient.  Okay to resume Eliquis and monitor.  Started B12 supplements  Hemoglobin stable, up to 9.1 from 8.5.  Down in endoscopy for EGD colonoscopy.  Thrombocytopenia  Unclear etiology.  Improving.  Klebsiella pneumonia UTI  Patient reported dysuria on admission.  Ruled out for sepsis.  Urine  microscopy concerning for UTI.  Abdominal CT showed air-fluid level in bladder.  EDP discussed with urology who recommended 2 weeks of Bactrim.  Currently on day 4 of IV ceftriaxone.  Urine culture showed >100 K colonies of  Klebsiella, susceptibilities still pending.  Hypomagnesemia  Replaced.  Type II DM  A1c 8.1  Patient reported being off of his insulins due to hypoglycemic episode up to 45 mg per DL.  Also on weekly Ozempic and Metformin.  Currently on hold.  Reasonably controlled on SSI.  Chronic diastolic CHF  2D echo 01/2749: LVEF 65-70%.  Given dehydration, held Lasix.  Clinically euvolemic.  May need to continue to hold Lasix at discharge due to orthostatic hypotension and closely follow with PCP or cardiology as outpatient.  Chronic A. fib  Rate controlled without meds.  Eliquis on hold while GI procedures ongoing.  I personally called and discussed with patient's primary Cardiologist Dr. [name] on 4/7.  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  Had creatinine of 1.97 on 3/9.  Presented with creatinine of 1.44, improved to 1.24 >1.26 >1.12.  Follow BMP.  Acute kidney injury resolved.  Body mass index is 30.87 kg/m.   DVT prophylaxis: Place TED hose Start: 04/29/20 1530 SCDs Start: 04/28/20 2045     Code Status: Full Code Family Communication: None at bedside.  Patient declined my offer to speak with any of his family or friends.  Stated that he had none. Disposition:  Status is: Inpatient  Remains inpatient appropriate because:Inpatient level of care appropriate due to severity of illness   Dispo: The patient is from: Home              Anticipated d/c is to: Home              Patient currently is not medically stable to d/c.   Difficult to place patient No        Consultants:   GI  Procedures:   None  Antimicrobials:    Anti-infectives (From admission, onward)   Start     Dose/Rate Route Frequency Ordered Stop   04/28/20 1815  [MAR Hold]  cefTRIAXone (ROCEPHIN) 2 g in sodium chloride 0.9 % 100 mL IVPB        (MAR Hold since Fri 05/01/2020 at 1202.Hold Reason: Transfer to a Procedural area.)   2  g 200 mL/hr over 30 Minutes Intravenous Every 24 hours 04/28/20 1800          Subjective:  Seen this morning prior to procedure.  Reported multiple BMs after bowel prep, brown-colored without blood or black color.  Wants to know when he can go home.  Objective:   Vitals:   04/30/20 1300 04/30/20 2057 05/01/20 0507 05/01/20 1215  BP: (!) 176/72 (!) 175/71 (!) 174/79 (!) 189/74  Pulse: 70 73 73   Resp: 20 20 20 20   Temp: 98.9 F (37.2 C) 98.1 F (36.7 C) 97.9 F (36.6 C) 97.9 F (36.6 C)  TempSrc:  Oral Oral Oral  SpO2: 98% 99% 100% 100%  Weight:      Height:        General exam: Elderly male, moderately built and nourished sitting up comfortably in bed.  May be somewhat hard of hearing.  Oral mucosa moist Respiratory system: Clear to auscultation.  No increased work of breathing. Cardiovascular system: S1 and S2 heard, RRR.  No JVD, murmurs or pedal edema. Gastrointestinal system: Abdomen is nondistended,  soft and nontender.  No organomegaly or masses appreciated.  Normal bowel sounds heard. Central nervous system: Alert and oriented. No focal neurological deficits. Extremities: Symmetric 5 x 5 power.  Has bilateral lower extremity thigh-high compression stockings Skin: No rashes, lesions or ulcers Psychiatry: Judgement and insight may be somewhat impaired and possibly has memory impairment issues. Mood & affect appropriate.     Data Reviewed:   I have personally reviewed following labs and imaging studies   CBC: Recent Labs  Lab 04/28/20 1102 04/28/20 1316 04/29/20 0500 04/30/20 0538 05/01/20 0454  WBC 8.8  --  7.4 7.4 8.1  NEUTROABS 6.5  --   --   --   --   HGB 9.4*   < > 8.9* 8.5* 9.1*  HCT 29.2*   < > 27.5* 26.9* 28.5*  MCV 101.7*  --  101.9* 103.1* 102.5*  PLT 151  --  142* 130* 145*   < > = values in this interval not displayed.    Basic Metabolic Panel: Recent Labs  Lab 04/28/20 1102 04/29/20 0500 04/30/20 0538 05/01/20 0454  NA 135 138 139 140   K 4.1 4.0 3.6 4.0  CL 103 108 109 107  CO2 19* 22 21* 24  GLUCOSE 242* 164* 175* 177*  BUN 22 16 12 9   CREATININE 1.44* 1.24 1.26* 1.12  CALCIUM 8.3* 7.6* 7.6* 8.0*  MG 1.5* 1.8  --   --     Liver Function Tests: Recent Labs  Lab 04/28/20 1102  AST 18  ALT 10  ALKPHOS 61  BILITOT 0.6  PROT 6.6  ALBUMIN 2.9*    CBG: Recent Labs  Lab 04/30/20 2059 05/01/20 0730 05/01/20 1110  GLUCAP 190* 156* 149*    Microbiology Studies:   Recent Results (from the past 240 hour(s))  Resp Panel by RT-PCR (Flu A&B, Covid) Nasopharyngeal Swab     Status: None   Collection Time: 04/28/20  5:20 PM   Specimen: Nasopharyngeal Swab; Nasopharyngeal(NP) swabs in vial transport medium  Result Value Ref Range Status   SARS Coronavirus 2 by RT PCR NEGATIVE NEGATIVE Final    Comment: (NOTE) SARS-CoV-2 target nucleic acids are NOT DETECTED.  The SARS-CoV-2 RNA is generally detectable in upper respiratory specimens during the acute phase of infection. The lowest concentration of SARS-CoV-2 viral copies this assay can detect is 138 copies/mL. A negative result does not preclude SARS-Cov-2 infection and should not be used as the sole basis for treatment or other patient management decisions. A negative result may occur with  improper specimen collection/handling, submission of specimen other than nasopharyngeal swab, presence of viral mutation(s) within the areas targeted by this assay, and inadequate number of viral copies(<138 copies/mL). A negative result must be combined with clinical observations, patient history, and epidemiological information. The expected result is Negative.  Fact Sheet for Patients:  EntrepreneurPulse.com.au  Fact Sheet for Healthcare Providers:  IncredibleEmployment.be  This test is no t yet approved or cleared by the [place] FDA and  has been authorized for detection and/or diagnosis of SARS-CoV-2 by FDA under an  Emergency Use Authorization (EUA). This EUA will remain  in effect (meaning this test can be used) for the duration of the COVID-19 declaration under Section 564(b)(1) of the Act, 21 U.S.C.section 360bbb-3(b)(1), unless the authorization is terminated  or revoked sooner.       Influenza A by PCR NEGATIVE NEGATIVE Final   Influenza B by PCR NEGATIVE NEGATIVE Final    Comment: (NOTE) The Xpert Xpress  SARS-CoV-2/FLU/RSV plus assay is intended as an aid in the diagnosis of influenza from Nasopharyngeal swab specimens and should not be used as a sole basis for treatment. Nasal washings and aspirates are unacceptable for Xpert Xpress SARS-CoV-2/FLU/RSV testing.  Fact Sheet for Patients: EntrepreneurPulse.com.au  Fact Sheet for Healthcare Providers: IncredibleEmployment.be  This test is not yet approved or cleared by the [place] FDA and has been authorized for detection and/or diagnosis of SARS-CoV-2 by FDA under an Emergency Use Authorization (EUA). This EUA will remain in effect (meaning this test can be used) for the duration of the COVID-19 declaration under Section 564(b)(1) of the Act, 21 U.S.C. section 360bbb-3(b)(1), unless the authorization is terminated or revoked.  Performed at [facility], [address], [place], [place] [address]   Culture, Urine     Status: Abnormal (Preliminary result)   Collection Time: 04/28/20  6:58 PM   Specimen: Urine, Clean Catch  Result Value Ref Range Status   Specimen Description   Final    URINE, CLEAN CATCH Performed at [facility], [address] [place], [place] [address]    Special Requests   Final    NONE Performed at [facility], [address], [place], [place] [address]    Culture (A)  Final    >=100,000 COLONIES/mL KLEBSIELLA PNEUMONIAE SUSCEPTIBILITIES TO FOLLOW Performed at [facility] [address], [place], [place] [address]    Report Status PENDING  Incomplete      Radiology Studies:  No results found.   Scheduled Meds:   . [MAR Hold] gabapentin  300 mg Oral BID  . [MAR Hold] insulin aspart  0-15 Units Subcutaneous TID WC  . [MAR Hold] insulin aspart  0-5 Units Subcutaneous QHS  . [MAR Hold] levothyroxine  175 mcg Oral QAC breakfast  . [MAR Hold] pantoprazole  40 mg Oral QAC breakfast  . [MAR Hold] vitamin B-12  500 mcg Oral Daily    Continuous Infusions:   . sodium chloride    . [MAR Hold] cefTRIAXone (ROCEPHIN)  IV 2 g (04/30/20 1800)     LOS: 3 days     Vernell Leep, MD, [place], [facility]. [facility]    To contact the attending provider between 7A-7P or the covering provider during after hours 7P-7A, please log into the web site [email or web address] and access using universal  password for that web site. If you do not have the password, please call the hospital operator.  05/01/2020, 2:44 PM

## 2020-05-01 NOTE — Anesthesia Preprocedure Evaluation (Signed)
Anesthesia Evaluation  Patient identified by MRN, date of birth, ID band Patient awake    Reviewed: Allergy & Precautions, H&P , NPO status , Patient's Chart, lab work & pertinent test results, reviewed documented beta blocker date and time   Airway Mallampati: II  TM Distance: >3 FB Neck ROM: full    Dental no notable dental hx.    Pulmonary COPD, former smoker,    Pulmonary exam normal breath sounds clear to auscultation       Cardiovascular Exercise Tolerance: Good hypertension, + CAD and +CHF  + pacemaker + Valvular Problems/Murmurs (S/P AVR)  Rhythm:regular Rate:Normal     Neuro/Psych negative neurological ROS  negative psych ROS   GI/Hepatic Neg liver ROS, GERD  Medicated,  Endo/Other  diabetesHypothyroidism   Renal/GU negative Renal ROS  negative genitourinary   Musculoskeletal   Abdominal   Peds  Hematology  (+) Blood dyscrasia, anemia ,   Anesthesia Other Findings   Reproductive/Obstetrics negative OB ROS                             Anesthesia Physical Anesthesia Plan  ASA: III and emergent  Anesthesia Plan: General   Post-op Pain Management:    Induction:   PONV Risk Score and Plan: Propofol infusion  Airway Management Planned:   Additional Equipment:   Intra-op Plan:   Post-operative Plan:   Informed Consent: I have reviewed the patients History and Physical, chart, labs and discussed the procedure including the risks, benefits and alternatives for the proposed anesthesia with the patient or authorized representative who has indicated his/her understanding and acceptance.     Dental Advisory Given  Plan Discussed with: CRNA  Anesthesia Plan Comments:         Anesthesia Quick Evaluation

## 2020-05-01 NOTE — Op Note (Signed)
[facility] Patient Name: [name] Procedure Date: 05/01/2020 3:06 PM MRN: [id number] Date of Birth: [birth year] Attending MD: Elon Alas. Abbey Chatters DO CSN: [id number] Age: 75 Admit Type: Inpatient Procedure:                Upper GI endoscopy Indications:              Iron deficiency anemia, Early satiety Providers:                Elon Alas. Abbey Chatters, DO, Gwynneth Albright RN, RN,                            Nelma Rothman, Technician Referring MD:              Medicines:                See the Anesthesia note for documentation of the                            administered medications Complications:            No immediate complications. Estimated Blood Loss:     Estimated blood loss was minimal. Procedure:                Pre-Anesthesia Assessment:                           - The anesthesia plan was to use monitored                            anesthesia care (MAC).                           After obtaining informed consent, the endoscope was                            passed under direct vision. Throughout the                            procedure, the patient's blood pressure, pulse, and                            oxygen saturations were monitored continuously. The                            GIF-H190 ([id number]) scope was introduced through the                            mouth, and advanced to the second part of duodenum.                            The upper GI endoscopy was accomplished without                            difficulty. The patient tolerated the procedure                            well.  Scope In: 3:17:14 PM Scope Out: 3:18:37 PM Total Procedure Duration: 0 hours 1 minute 23 seconds  Findings:      There is no endoscopic evidence of Barrett's esophagus, bleeding, areas       of erosion, esophagitis, hiatal hernia, ulcerations or varices in the       entire esophagus.      Diffuse moderate inflammation characterized by erosions and erythema was       found in the  entire examined stomach. Biopsies were taken with a cold       forceps for Helicobacter pylori testing.      The duodenal bulb, first portion of the duodenum and second portion of       the duodenum were normal. Impression:               - Gastritis. Biopsied.                           - Normal duodenal bulb, first portion of the                            duodenum and second portion of the duodenum. Moderate Sedation:      Per Anesthesia Care Recommendation:           - Return patient to hospital ward for ongoing care.                           - See colonoscopy report for further                            recommendations. Procedure Code(s):        --- Professional ---                           [phone], Esophagogastroduodenoscopy, flexible,                            transoral; with biopsy, single or multiple Diagnosis Code(s):        --- Professional ---                           K29.70, Gastritis, unspecified, without bleeding                           D50.9, Iron deficiency anemia, unspecified                           R68.81, Early satiety CPT copyright 2019 American Medical Association. All rights reserved. The codes documented in this report are preliminary and upon coder review may  be revised to meet current compliance requirements. Elon Alas. Abbey Chatters, DO [place] Abbey Chatters, DO 05/01/2020 3:23:14 PM This report has been signed electronically. Number of Addenda: 0

## 2020-05-01 NOTE — Transfer of Care (Signed)
Immediate Anesthesia Transfer of Care Note  Patient: [name]  Procedure(s) Performed: COLONOSCOPY WITH PROPOFOL (N/A ) BIOPSY ESOPHAGOGASTRODUODENOSCOPY (EGD) WITH PROPOFOL (N/A )  Patient Location: PACU  Anesthesia Type:General  Level of Consciousness: awake, alert  and oriented  Airway & Oxygen Therapy: Patient Spontanous Breathing and Patient connected to nasal cannula oxygen  Post-op Assessment: Report given to RN, Post -op Vital signs reviewed and stable and Patient moving all extremities X 4  Post vital signs: Reviewed and stable  Last Vitals:  Vitals Value Taken Time  BP    Temp    Pulse    Resp    SpO2      Last Pain:  Vitals:   05/01/20 1517  TempSrc:   PainSc: 0-No pain      Patients Stated Pain Goal: 8 (34/35/68 6168)  Complications: No complications documented.

## 2020-05-01 NOTE — Progress Notes (Signed)
Physical Therapy Treatment Patient Details Name: [name] MRN: [id number] DOB: 1946-01-07 Today's Date: 05/01/2020    History of Present Illness [name] is a 75 y.o. male with medical history significant for  COPD, Atria fib + pace maker, diabetes mellitus, hypertension, aortic valve replacement.  Patient presented to the ED with complaints of generalized weakness over the past 2 weeks.  Reports 2 days of watery stools, 2 watery stools yesterday none today.  No vomiting.  Reports poor oral intake for 2 weeks.  He is unaware of any black stools or blood in stools (though he did not look at his stool color).  Reports left lower quadrant abdominal pain.  He also reports intermittent pain with urination over the past several days.  Reports dizziness when standing, and says due to weakness is barely able to walk.  No fevers no chills.  No cough no difficulty breathing.  No chest pain.    PT Comments    Patient seated in chair at beginning of session and transfers to bed without assist. Patient completes seated exercises following initial demonstration with good mechanics while showing good dynamic seated balance. Patient transfers to standing and ambulates without physical assist but is somewhat unsteady with ambulation without loss of balance or fall. Patient ambulates increased distance today and is limited by fatigue. Patient will benefit from continued physical therapy in hospital and recommended venue below to increase strength, balance, endurance for safe ADLs and gait.    Follow Up Recommendations  No PT follow up;Supervision - Intermittent     Equipment Recommendations  None recommended by PT    Recommendations for Other Services       Precautions / Restrictions Precautions Precautions: None Restrictions Weight Bearing Restrictions: No    Mobility  Bed Mobility Overal bed mobility: Modified Independent                  Transfers Overall transfer level:  Needs assistance   Transfers: Sit to/from Stand;Stand Pivot Transfers Sit to Stand: Supervision Stand pivot transfers: Supervision       General transfer comment: slightly labored movement  Ambulation/Gait Ambulation/Gait assistance: Supervision Gait Distance (Feet): 90 Feet Assistive device: None Gait Pattern/deviations: Decreased step length - right;Decreased step length - left;Decreased stride length Gait velocity: decreased   General Gait Details: slightly labored unsteady cadence without loss of balance, limited mostly due to fatigue   Stairs             Wheelchair Mobility    Modified Rankin (Stroke Patients Only)       Balance Overall balance assessment: Needs assistance Sitting-balance support: No upper extremity supported;Feet supported Sitting balance-Leahy Scale: Normal Sitting balance - Comments: seated EOB   Standing balance support: No upper extremity supported Standing balance-Leahy Scale: Good                              Cognition Arousal/Alertness: Awake/alert Behavior During Therapy: WFL for tasks assessed/performed Overall Cognitive Status: Within Functional Limits for tasks assessed                                        Exercises General Exercises - Lower Extremity Long Arc Quad: AROM;Both;20 reps;Seated Hip Flexion/Marching: AROM;Both;20 reps;Seated Toe Raises: AROM;Both;20 reps;Seated Heel Raises: AROM;Both;20 reps;Seated    General Comments  Pertinent Vitals/Pain Pain Assessment: No/denies pain    Home Living                      Prior Function            PT Goals (current goals can now be found in the care plan section) Acute Rehab PT Goals Patient Stated Goal: return home PT Goal Formulation: With patient Time For Goal Achievement: 05/08/20 Potential to Achieve Goals: Good Progress towards PT goals: Progressing toward goals    Frequency    Min 2X/week      PT  Plan Current plan remains appropriate    Co-evaluation              AM-PAC PT "6 Clicks" Mobility   Outcome Measure  Help needed turning from your back to your side while in a flat bed without using bedrails?: None Help needed moving from lying on your back to sitting on the side of a flat bed without using bedrails?: None Help needed moving to and from a bed to a chair (including a wheelchair)?: None Help needed standing up from a chair using your arms (e.g., wheelchair or bedside chair)?: None Help needed to walk in hospital room?: A Little Help needed climbing 3-5 steps with a railing? : A Little 6 Click Score: 22    End of Session   Activity Tolerance: Patient tolerated treatment well;Patient limited by fatigue Patient left: with call bell/phone within reach;in bed Nurse Communication: Mobility status PT Visit Diagnosis: Unsteadiness on feet (R26.81);Other abnormalities of gait and mobility (R26.89);Muscle weakness (generalized) (M62.81)     Time: 2694-8546 PT Time Calculation (min) (ACUTE ONLY): 9 min  Charges:  $Therapeutic Exercise: 8-22 mins                      11:42 AM, 05/01/20 Mearl Latin PT, DPT Physical Therapist at [facility]

## 2020-05-01 NOTE — Anesthesia Postprocedure Evaluation (Signed)
Anesthesia Post Note  Patient: Raye Slyter Yeagle  Procedure(s) Performed: COLONOSCOPY WITH PROPOFOL (N/A ) BIOPSY ESOPHAGOGASTRODUODENOSCOPY (EGD) WITH PROPOFOL (N/A )  Patient location during evaluation: Phase II Anesthesia Type: General Level of consciousness: awake, oriented, awake and alert and patient cooperative Pain management: satisfactory to patient Vital Signs Assessment: post-procedure vital signs reviewed and stable Respiratory status: spontaneous breathing, respiratory function stable and nonlabored ventilation Cardiovascular status: stable Postop Assessment: no apparent nausea or vomiting Anesthetic complications: no   No complications documented.   Last Vitals:  Vitals:   05/01/20 1430 05/01/20 1445  BP:    Pulse: 71 74  Resp: 13 18  Temp:    SpO2: 98% 96%    Last Pain:  Vitals:   05/01/20 1517  TempSrc:   PainSc: 0-No pain                 Bettylou Frew

## 2020-05-02 LAB — BASIC METABOLIC PANEL
Anion gap: 10 (ref 5–15)
BUN: 7 mg/dL — ABNORMAL LOW (ref 8–23)
CO2: 23 mmol/L (ref 22–32)
Calcium: 8.3 mg/dL — ABNORMAL LOW (ref 8.9–10.3)
Chloride: 107 mmol/L (ref 98–111)
Creatinine, Ser: 1.05 mg/dL (ref 0.61–1.24)
GFR, Estimated: >60 mL/min (ref >60)
Glucose, Bld: 181 mg/dL — ABNORMAL HIGH (ref 70–99)
Potassium: 4.2 mmol/L (ref 3.5–5.1)
Sodium: 140 mmol/L (ref 135–145)

## 2020-05-02 LAB — CBC
HCT: 29.3 % — ABNORMAL LOW (ref 39.0–52.0)
Hemoglobin: 9.5 g/dL — ABNORMAL LOW (ref 13.0–17.0)
MCH: 32.9 pg (ref 26.0–34.0)
MCHC: 32.4 g/dL (ref 30.0–36.0)
MCV: 101.4 fL — ABNORMAL HIGH (ref 80.0–100.0)
Platelets: 147 10*3/uL — ABNORMAL LOW (ref 150–400)
RBC: 2.89 MIL/uL — ABNORMAL LOW (ref 4.22–5.81)
RDW: 17.3 % — ABNORMAL HIGH (ref 11.5–15.5)
WBC: 7.1 10*3/uL (ref 4.0–10.5)
nRBC: 0 % (ref 0.0–0.2)

## 2020-05-02 LAB — GLUCOSE, CAPILLARY: Glucose-Capillary: 170 mg/dL — ABNORMAL HIGH (ref 70–99)

## 2020-05-02 MED ORDER — PANTOPRAZOLE SODIUM 40 MG PO TBEC: 40.0000 mg | DELAYED_RELEASE_TABLET | Freq: Every day | ORAL | 0 refills | Status: AC

## 2020-05-02 MED ORDER — SULFAMETHOXAZOLE-TRIMETHOPRIM 800-160 MG PO TABS: 1.0000 | ORAL_TABLET | Freq: Two times a day (BID) | ORAL | 0 refills | Status: AC

## 2020-05-02 MED ORDER — LEVEMIR FLEXTOUCH 100 UNIT/ML ~~LOC~~ SOPN: 5.0000 [IU] | PEN_INJECTOR | Freq: Every day | SUBCUTANEOUS | Status: AC

## 2020-05-02 MED ORDER — CYANOCOBALAMIN 500 MCG PO TABS: 500.0000 ug | ORAL_TABLET | Freq: Every day | ORAL | 0 refills | Status: DC

## 2020-05-02 NOTE — Progress Notes (Signed)
Discharge instructions provided to patient. Patient stated he will not be taking the prescribed protonix. Education provided to patient on use and benefits of protonix. Patient still states he will not be taking medication. Otherwise, no further questions.

## 2020-05-02 NOTE — Progress Notes (Signed)
Assumed pt care at 0700. Received report from [place], Therapist, sports. Pt resting comfortably in bed in no acute distress.  A&O. Call bell within reach. Will continue to monitor.

## 2020-05-02 NOTE — Discharge Instructions (Signed)

## 2020-05-02 NOTE — Discharge Summary (Signed)
Physician Discharge Summary  [name] HCW:[id number] DOB: 07/24/1945  PCP: Dettinger, Fransisca Kaufmann, MD  Admitted from: Home Discharged to: Home  Admit date: 04/28/2020 Discharge date: 05/02/2020  Recommendations for Outpatient Follow-up:    Follow-up Information    Dettinger, Fransisca Kaufmann, MD. Schedule an appointment as soon as possible for a visit in 1 week(s).   Specialties: Family Medicine, Cardiology Why: To be seen with repeat labs (CBC & BMP).   Contact information: [place] [address] [phone]        Rogene Houston, MD. Schedule an appointment as soon as possible for a visit.   Specialty: Gastroenterology Why: Need to follow-up regarding repeat colonoscopy. Contact information: [address], [address] [place] [address] [phone]        Minus Breeding, MD. Schedule an appointment as soon as possible for a visit in 1 week(s).   Specialty: Cardiology Contact information: [address] [place] [address] [phone]        Cleon Gustin, MD. Schedule an appointment as soon as possible for a visit in 1 week(s).   Specialty: Urology Contact information: [place] [address] [place]: None    Equipment/Devices: None    Discharge Condition: Improved and stable.   Code Status: Full Code Diet recommendation:  Discharge Diet Orders (From admission, onward)    Start     Ordered   05/02/20 0000  Diet - low sodium heart healthy        05/02/20 0854   05/02/20 0000  Diet Carb Modified        05/02/20 0854           Discharge Diagnoses:  Principal Problem:   Generalized weakness Active Problems:   Essential hypertension   MURMUR   Aortic valve disease   Anemia   Type 2 diabetes mellitus with diabetic neuropathy, unspecified (HCC)   CHF (congestive heart failure) (HCC)   Acute lower UTI   Atrial fibrillation (HCC)   S/P AVR (aortic valve  replacement)   COPD (chronic obstructive pulmonary disease) (HCC)   Orthostatic hypotension   Brief Summary: 75 year old male with medical history significant for but not limited to COPD, atrial fibrillation, pacemaker, diabetes mellitus, hypertension, aortic valve replacement, presented to the ED on 04/28/2020 due to 2 days history of diarrhea without vomiting but with associated poor oral intake for 2 weeks.  No report of black tarry stools or blood in stools.  Intermittent dysuria of several days duration.  Reported dizziness when standing and due to weakness barely able to walk.  Seen initially at urgent care and subsequently referred to the ED.  Admitted for orthostatic hypotension, possible UTI, subacute anemia and generalized weakness.  Despite overnight IVF, hemoglobin stable in the 8.9-9 range, still has some orthostatic hypotension.  GI consulted and patient declined EGD/colonoscopy on 4/6 and wished to pursue as outpatient.  Hemoglobin further dropped to 8.5, patient now agreeable for GI procedures and underwent EGD and inadequately prepped colonoscopy.  Kindly refer to H&P for details of initial admission.   Assessment & Plan:   Generalized weakness  Suspected multifactorial due to dehydration from GI losses, poor oral intake, UTI and symptomatic anemia.  Physical therapy follow-up appreciated, no outpatient PT follow-up recommended.  Continued to be orthostatic until 4/8 but asymptomatic.  Orthostatic hypotension  SBP in ED  dropped from 170 > 109  Suspect due to poor oral intake, GI losses and low-dose Lasix at home.  S/p 2 L IVF bolus and maintenance IV fluids overnight.  Lasix and lisinopril on hold.  Clinically euvolemic.  Discontinued IV fluids.  Continue lower extremity compression stockings, has them on now.  Ongoing persistent orthostatic hypotension on 4/8; (Supine 176/75, sitting 170/79, lying 120/58). Seems asymptomatic. Avoiding Midodrine d/t supine  hypertension.  Counseled extensively regarding multimodality management of this at home including adequate hydration, compression stockings to the legs, gradual initiation of activity from lying to sitting, sitting to standing, standing to ambulating, avoid prolonged standing and take breaks and sit down or lie down.  He denies history of recent falls or syncope.  He verbalized understanding.  Close outpatient follow-up with PCP/cardiology  Subacute anemia, iron and B12 deficiency  No report of overt GI bleeding.  Hemoglobin dropped from baseline 12-13 in February to 8.9-9 g range this admission.  FOBT negative.  Abdominal CT without evidence of bleeding.  Last colonoscopy 2014: Normal rectum and colon, repeat in 5 years.  EGD then showed inflamed gastric mucosa and 4 mm duodenal ulcer.  Anemia panel: Iron 38, TIBC 232, saturation ratio 16, ferritin 704, folate 17.4 and B12: 195.  Eliquis was held and GI was consulted for possible EGD and colonoscopy given substantial hemoglobin drop since February, need for ongoing anticoagulation for his A. fib to make sure that he does not have any lesions.  Patient initially adamantly refused EGD/colonoscopy and wished to pursue it as outpatient.  Subsequently he consented and underwent EGD and suboptimally prepped colonoscopy on 4/8 with no significant findings.  GI cleared him for discharge home and will arrange outpatient follow-up  and the colonoscopy will need to be repeated.  Eliquis resumed post procedure  Started B12 supplements  Hemoglobin stable in the 9 g range  Follow CBCs closely as outpatient  Thrombocytopenia  Unclear etiology.  Improving.  ESBL Klebsiella pneumoniae UTI  Patient reported dysuria on admission.  Ruled out for sepsis.  Urine microscopy concerning for UTI.  Abdominal CT showed air-fluid level in bladder.  EDP discussed with Dr. Alyson Ingles, Urology who recommended 2 weeks of Bactrim.  Treated in the  hospital with IV ceftriaxone and his symptoms resolved.  Urine culture showed >100 K colonies of Klebsiella, susceptibilities were still not back despite more than 48 hours.  I called microbiology lab and as per tech, he has ESBL organism but sensitive to Bactrim, Cipro, levofloxacin and other tests are being drawn.  Discharged on Bactrim for 10 days and recommended outpatient follow-up with urology.  Urine microscopy on admission did show significant microscopic hematuria will need to be closely evaluated as outpatient.  Hypomagnesemia  Replaced.  Type II DM  A1c 8.1  Patient reported being off of his insulins due to hypoglycemic episode up to 45 mg per DL.  Also on weekly Ozempic and Metformin.    Resumed at discharge.  Reasonably controlled on SSI.  Given above high A1c, at time of discharge resume patient on low-dose Levemir 5 units daily with close outpatient follow-up with PCP.  Patient aware of hypoglycemia symptoms and management  Chronic diastolic CHF  2D echo 0/1093: LVEF 65-70%.  Patient remains clinically euvolemic despite being off of Lasix for several days and IV fluid hydration.  Continue to hold it until close outpatient follow-up with PCP/cardiology.  Chronic A. fib  Rate controlled without meds.  Eliquis which was briefly held for procedures has been  resumed.  I personally called and discussed with patient's primary Cardiologist Dr. [name] on 4/7.  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  Had creatinine of 1.97 on 3/9.  Presented with creatinine of 1.44, improved to 1.24 >1.26 >1.12 >1.05.  Acute kidney injury resolved.  Low-dose lisinopril also on hold until close outpatient follow-up with PCP/cardiology  Body mass index is 30.87 kg/m.     Consultants:   GI  Procedures:    Colonoscopy 05/01/2020:  Impression:  - Preparation of the colon was inadequate. - Internal  hemorrhoids. - Stool in the ascending colon and in the cecum. - No specimens collected.  Recommendations:  - Return patient to hospital ward for ongoing care. - No identifiable source for GI bleeding found. Patient will need repeat colonoscopy in the outpatient setting as the R side of his colon was not adequately prepped. Okay to resume Eliquis this evening. We will follow up on biopsies. Follow up with GI in clinic.  EGD 05/01/2020:  Impression:  - Gastritis. Biopsied. - Normal duodenal bulb, first portion of the duodenum and second portion of the Duodenum.   Discharge Instructions  Discharge Instructions    (HEART FAILURE PATIENTS) Call MD:  Anytime you have any of the following symptoms: 1) 3 pound weight gain in 24 hours or 5 pounds in 1 week 2) shortness of breath, with or without a dry hacking cough 3) swelling in the hands, feet or stomach 4) if you have to sleep on extra pillows at night in order to breathe.   Complete by: As directed    Call MD for:  difficulty breathing, headache or visual disturbances   Complete by: As directed    Call MD for:  extreme fatigue   Complete by: As directed    Call MD for:  persistant dizziness or light-headedness   Complete by: As directed    Call MD for:  persistant nausea and vomiting   Complete by: As directed    Call MD for:  severe uncontrolled pain   Complete by: As directed    Call MD for:  temperature >100.4   Complete by: As directed    Diet - low sodium heart healthy   Complete by: As directed    Diet Carb Modified   Complete by: As directed    Discharge instructions   Complete by: As directed    Continue to use bilateral lower extremity compression stockings especially when you are up and about.   Increase activity slowly   Complete by: As directed        Medication List    STOP taking these medications   furosemide 20 MG tablet Commonly known as: LASIX   lisinopril 5 MG tablet Commonly known as: ZESTRIL    MENS MULTI VITAMIN & MINERAL PO   miconazole nitrate Powd Commonly known as: MICATIN   mupirocin ointment 2 % Commonly known as: BACTROBAN   tamsulosin 0.4 MG Caps capsule Commonly known as: FLOMAX   traMADol 50 MG tablet Commonly known as: ULTRAM     TAKE these medications   acetaminophen 500 MG tablet Commonly known as: TYLENOL Take 1,000 mg by mouth every 6 (six) hours as needed (pain).   apixaban 5 MG Tabs tablet Commonly known as: Eliquis Take 1 tablet (5 mg total) by mouth 2 (two) times daily.   cholecalciferol 1000 units tablet Commonly known as: VITAMIN D Take 1,000 Units by mouth daily.  gabapentin 300 MG capsule Commonly known as: NEURONTIN Take 1 capsule (300 mg total) by mouth 2 (two) times daily.   glucose blood test strip 1 strip by Does not apply route 3 (three) times daily. Uses true metrix strips (diabetic club)   hydrocortisone cream 1 % Apply 1 application topically 2 (two) times daily.   Levemir FlexTouch 100 UNIT/ML FlexPen Generic drug: insulin detemir Inject 5 Units into the skin daily. What changed:   how much to take  when to take this   levothyroxine 175 MCG tablet Commonly known as: SYNTHROID Take 1 tablet (175 mcg total) by mouth daily before breakfast.   loperamide 2 MG tablet Commonly known as: Imodium A-D Take 1 tablet (2 mg total) by mouth 4 (four) times daily as needed for diarrhea or loose stools.   metFORMIN 1000 MG tablet Commonly known as: GLUCOPHAGE TAKE 1 TABLET BY MOUTH TWICE DAILY WITH A MEAL. What changed: when to take this   Ozempic (1 MG/DOSE) 2 MG/1.5ML Sopn Generic drug: Semaglutide (1 MG/DOSE) Inject 1 mg into the skin once a week.   pantoprazole 40 MG tablet Commonly known as: PROTONIX Take 1 tablet (40 mg total) by mouth daily before breakfast. Start taking on: May 03, 2020   simvastatin 80 MG tablet Commonly known as: ZOCOR Take 0.5 tablets (40 mg total) by mouth at bedtime.    sulfamethoxazole-trimethoprim 800-160 MG tablet Commonly known as: BACTRIM DS Take 1 tablet by mouth 2 (two) times daily for 10 days.   vitamin B-12 500 MCG tablet Commonly known as: CYANOCOBALAMIN Take 1 tablet (500 mcg total) by mouth daily. Start taking on: May 03, 2020      Allergies  Allergen Reactions  . Phenothiazines Anaphylaxis  . Actos [Pioglitazone] Swelling      Procedures/Studies: CT Abdomen Pelvis W Contrast  Result Date: 04/28/2020 CLINICAL DATA:  Abdominal pain, hernia suspected, loss of appetite and diarrhea for 3 days EXAM: CT ABDOMEN AND PELVIS WITH CONTRAST TECHNIQUE: Multidetector CT imaging of the abdomen and pelvis was performed using the standard protocol following bolus administration of intravenous contrast. CONTRAST:  64mL OMNIPAQUE IOHEXOL 300 MG/ML  SOLN COMPARISON:  10/11/2018 FINDINGS: Lower chest: No acute abnormality. Hepatobiliary: No solid liver abnormality is seen. No gallstones, gallbladder wall thickening, or biliary dilatation. Pancreas: Unremarkable. No pancreatic ductal dilatation or surrounding inflammatory changes. Spleen: Normal in size without significant abnormality. Adrenals/Urinary Tract: Adrenal glands are unremarkable. Kidneys are normal, without renal calculi, solid lesion, or hydronephrosis. Air-fluid level within the urinary bladder. Stomach/Bowel: Stomach is within normal limits. Appendix appears normal. No evidence of bowel wall thickening, distention, or inflammatory changes. Vascular/Lymphatic: Aortic atherosclerosis. No enlarged abdominal or pelvic lymph nodes. Reproductive: No mass or other significant abnormality. Other: No abdominal wall hernia or abnormality. No abdominopelvic ascites. Musculoskeletal: No acute or significant osseous findings. IMPRESSION: 1. No acute CT findings of the abdomen or pelvis to explain abdominal pain, diarrhea, or loss of appetite. 2. Air-fluid level within the urinary bladder. Correlate for recent  catheterization. Aortic Atherosclerosis (ICD10-I70.0). Electronically Signed   By: Eddie Candle M.D.   On: 04/28/2020 12:53    Subjective: Patient denies complaints.  Anxious to go home.  No dizziness, lightheadedness, dyspnea, chest pain or palpitations.  No nausea or vomiting.  Tolerating diet well.  Has not had BM since procedure yesterday.  Denies syncope or falls.  Discharge Exam:  Vitals:   05/01/20 1539 05/01/20 1600 05/01/20 2047 05/02/20 0533  BP: 114/62 (!) 155/69 (!) 156/76 Marland Kitchen)  174/64  Pulse:  69 68 73  Resp: 18 15 18    Temp: 97.6 F (36.4 C)  97.6 F (36.4 C) (!) 96.7 F (35.9 C)  TempSrc:   Oral   SpO2: 99% 100% 99% 100%  Weight:      Height:        General exam: Elderly male, moderately built and nourished sitting up comfortably in bed eating breakfast this.  May be somewhat hard of hearing.  Oral mucosa moist Respiratory system: Clear to auscultation.  No increased work of breathing. Cardiovascular system: S1 and S2 heard, RRR.  No JVD, murmurs or pedal edema. Gastrointestinal system: Abdomen is nondistended, soft and nontender.  No organomegaly or masses appreciated.  Normal bowel sounds heard. Central nervous system: Alert and oriented. No focal neurological deficits. Extremities: Symmetric 5 x 5 power.  Has bilateral lower extremity thigh-high compression stockings Skin: No rashes, lesions or ulcers Psychiatry: Judgement and insight may be somewhat impaired and possibly has memory impairment issues. Mood & affect appropriate.     The results of significant diagnostics from this hospitalization (including imaging, microbiology, ancillary and laboratory) are listed below for reference.     Microbiology: Recent Results (from the past 240 hour(s))  Resp Panel by RT-PCR (Flu A&B, Covid) Nasopharyngeal Swab     Status: None   Collection Time: 04/28/20  5:20 PM   Specimen: Nasopharyngeal Swab; Nasopharyngeal(NP) swabs in vial transport medium  Result Value Ref  Range Status   SARS Coronavirus 2 by RT PCR NEGATIVE NEGATIVE Final    Comment: (NOTE) SARS-CoV-2 target nucleic acids are NOT DETECTED.  The SARS-CoV-2 RNA is generally detectable in upper respiratory specimens during the acute phase of infection. The lowest concentration of SARS-CoV-2 viral copies this assay can detect is 138 copies/mL. A negative result does not preclude SARS-Cov-2 infection and should not be used as the sole basis for treatment or other patient management decisions. A negative result may occur with  improper specimen collection/handling, submission of specimen other than nasopharyngeal swab, presence of viral mutation(s) within the areas targeted by this assay, and inadequate number of viral copies(<138 copies/mL). A negative result must be combined with clinical observations, patient history, and epidemiological information. The expected result is Negative.  Fact Sheet for Patients:  EntrepreneurPulse.com.au  Fact Sheet for Healthcare Providers:  IncredibleEmployment.be  This test is no t yet approved or cleared by the [place] FDA and  has been authorized for detection and/or diagnosis of SARS-CoV-2 by FDA under an Emergency Use Authorization (EUA). This EUA will remain  in effect (meaning this test can be used) for the duration of the COVID-19 declaration under Section 564(b)(1) of the Act, 21 U.S.C.section 360bbb-3(b)(1), unless the authorization is terminated  or revoked sooner.       Influenza A by PCR NEGATIVE NEGATIVE Final   Influenza B by PCR NEGATIVE NEGATIVE Final    Comment: (NOTE) The Xpert Xpress SARS-CoV-2/FLU/RSV plus assay is intended as an aid in the diagnosis of influenza from Nasopharyngeal swab specimens and should not be used as a sole basis for treatment. Nasal washings and aspirates are unacceptable for Xpert Xpress SARS-CoV-2/FLU/RSV testing.  Fact Sheet for  Patients: EntrepreneurPulse.com.au  Fact Sheet for Healthcare Providers: IncredibleEmployment.be  This test is not yet approved or cleared by the [place] FDA and has been authorized for detection and/or diagnosis of SARS-CoV-2 by FDA under an Emergency Use Authorization (EUA). This EUA will remain in effect (meaning this test can be used) for the  duration of the COVID-19 declaration under Section 564(b)(1) of the Act, 21 U.S.C. section 360bbb-3(b)(1), unless the authorization is terminated or revoked.  Performed at [facility], [address] [place], [place] [address]   Culture, Urine     Status: Abnormal (Preliminary result)   Collection Time: 04/28/20  6:58 PM   Specimen: Urine, Clean Catch  Result Value Ref Range Status   Specimen Description   Final    URINE, CLEAN CATCH Performed at [facility], [address] [place], [place] [address]    Special Requests   Final    NONE Performed at [facility], [address], [place], [place] [address]    Culture (A)  Final    >=100,000 COLONIES/mL KLEBSIELLA PNEUMONIAE SUSCEPTIBILITIES TO FOLLOW Performed at [facility] [address] [place], [place] [address]    Report Status PENDING  Incomplete     Labs: CBC: Recent Labs  Lab 04/28/20 1102 04/28/20 1316 04/29/20 0500 04/30/20 0538 05/01/20 0454 05/02/20 0631  WBC 8.8  --  7.4 7.4 8.1 7.1  NEUTROABS 6.5  --   --   --   --   --   HGB 9.4* 9.0* 8.9* 8.5* 9.1* 9.5*  HCT 29.2* 27.7* 27.5* 26.9* 28.5* 29.3*  MCV 101.7*  --  101.9* 103.1* 102.5* 101.4*  PLT 151  --  142* 130* 145* 147*    Basic Metabolic Panel: Recent Labs  Lab 04/28/20 1102 04/29/20 0500 04/30/20 0538 05/01/20 0454 05/02/20 0631  NA 135 138 139 140 140  K 4.1 4.0 3.6 4.0 4.2  CL 103 108 109 107 107  CO2 19* 22 21* 24 23  GLUCOSE 242* 164* 175* 177* 181*  BUN 22 16 12 9  7*  CREATININE 1.44* 1.24 1.26* 1.12 1.05  CALCIUM 8.3* 7.6* 7.6* 8.0*  8.3*  MG 1.5* 1.8  --   --   --     Liver Function Tests: Recent Labs  Lab 04/28/20 1102  AST 18  ALT 10  ALKPHOS 61  BILITOT 0.6  PROT 6.6  ALBUMIN 2.9*    CBG: Recent Labs  Lab 05/01/20 0730 05/01/20 1110 05/01/20 1618 05/01/20 2045 05/02/20 0741  GLUCAP 156* 149* 148* 186* 170*    Urinalysis    Component Value Date/Time   COLORURINE YELLOW 04/28/2020 1102   APPEARANCEUR CLOUDY (A) 04/28/2020 1102   APPEARANCEUR Clear 02/27/2020 0922   LABSPEC 1.014 04/28/2020 1102   PHURINE 5.0 04/28/2020 1102   GLUCOSEU 50 (A) 04/28/2020 1102   HGBUR MODERATE (A) 04/28/2020 1102   BILIRUBINUR NEGATIVE 04/28/2020 1102   BILIRUBINUR Negative 02/27/2020 0922   KETONESUR NEGATIVE 04/28/2020 1102   PROTEINUR 30 (A) 04/28/2020 1102   UROBILINOGEN 0.2 08/22/2019 1059   UROBILINOGEN 0.2 04/06/2014 1410   NITRITE NEGATIVE 04/28/2020 1102   LEUKOCYTESUR LARGE (A) 04/28/2020 1102      Time coordinating discharge: 45 minutes  SIGNED:  Vernell Leep, MD, FACP, [facility]. [facility]  To contact the attending provider between 7A-7P or the covering provider during after hours 7P-7A, please log into the web site [email or web address] and access using universal [place] password for that web site. If you do not have the password, please call the hospital operator.

## 2020-05-03 LAB — URINE CULTURE: Culture: >=100000 — AB

## 2020-05-03 LAB — CARBAPENEM RESISTANCE PANEL
Carba Resistance IMP Gene: NOT DETECTED
Carba Resistance KPC Gene: NOT DETECTED
Carba Resistance NDM Gene: NOT DETECTED
Carba Resistance OXA48 Gene: NOT DETECTED
Carba Resistance VIM Gene: NOT DETECTED

## 2020-05-04 ENCOUNTER — Telehealth: Payer: Self-pay

## 2020-05-04 NOTE — Telephone Encounter (Addendum)
Transition Care Management Unsuccessful Follow-up Telephone Call  Date of discharge and from where:  05/02/20 from [facility]  Attempts:  1st Attempt  Reason for unsuccessful TCM follow-up call:  Left voice message on home phone, voicemail not set up on cell.   Transition Care Management Follow-up Telephone Call  Date of discharge and from where: 05/02/20 from [facility]  How have you been since you were released from the hospital? Slowly getting better, walking better and has more strength today  Any questions or concerns? Yes - He was prescribed Pantoprazole 40mg , but he didn't get it because they told him it was for acid reflux and he has no issues with that.  Items Reviewed:  Did the pt receive and understand the discharge instructions provided? Yes   Medications obtained and verified? Yes   Other? No   Any new allergies since your discharge? No   Dietary orders reviewed? Yes  Do you have support at home? No  He lives alone, says he doesn't need help  Home Care and Equipment/Supplies: Were home health services ordered? no Were any new equipment or medical supplies ordered?  No  Functional Questionnaire: (I = Independent and D = Dependent) ADLs: I  Bathing/Dressing- I  Meal Prep- I  Eating- I  Maintaining continence- I  Transferring/Ambulation- I  Managing Meds- I  Follow up appointments reviewed:   PCP Hospital f/u appt confirmed? No  He needs to look at his calendar - has several other appts with specialist coming up. Says he will call to be seen in the next 2 weeks.  [facility] f/u appt confirmed? Yes  per patient, he has f/u appts with Arvella Merles and [facility].  Are transportation arrangements needed? No   If their condition worsens, is the pt aware to call PCP or go to the Emergency Dept.? Yes  Was the patient provided with contact information for the PCP's office or ED? Yes  Was to pt encouraged to call back with questions or concerns?  Yes

## 2020-05-04 NOTE — Telephone Encounter (Signed)
Pt is on the [facility] list

## 2020-05-04 NOTE — Telephone Encounter (Signed)
Lmom, waiting to see which lab pt would like to go to.

## 2020-05-05 ENCOUNTER — Other Ambulatory Visit: Payer: Self-pay

## 2020-05-05 DIAGNOSIS — I5032 Chronic diastolic (congestive) heart failure: Secondary | ICD-10-CM

## 2020-05-05 LAB — SURGICAL PATHOLOGY

## 2020-05-06 ENCOUNTER — Encounter (HOSPITAL_COMMUNITY): Payer: Self-pay | Admitting: Internal Medicine

## 2020-05-06 ENCOUNTER — Telehealth: Payer: Self-pay | Admitting: Internal Medicine

## 2020-05-06 NOTE — Telephone Encounter (Signed)
Called pt back and with a raised voices, pt states "I told yall to forget about it, I'm not having anything done". Pt declined blood work per Neil Crouch, PA request.

## 2020-05-06 NOTE — Telephone Encounter (Signed)
Noted  

## 2020-05-06 NOTE — Telephone Encounter (Signed)
PATIENT CALLED AND SAID THAT SOMEONE FROM HERE IS TRYING TO CALL HIM AND HE STATED THAT "HE IS NOT HAVING THIS DONE, JUST TEAR IT UP" HE THEN HUNG UP ON ME

## 2020-05-06 NOTE — Telephone Encounter (Signed)
Looks like [name] has been trying to call patient

## 2020-05-07 ENCOUNTER — Telehealth: Payer: Self-pay

## 2020-05-07 NOTE — Telephone Encounter (Signed)
R/C to Cathy 

## 2020-05-07 NOTE — Telephone Encounter (Signed)
Appt made for HFU on [facility] 05/14/20

## 2020-05-14 ENCOUNTER — Other Ambulatory Visit: Payer: Self-pay

## 2020-05-14 ENCOUNTER — Encounter: Payer: Self-pay | Admitting: Family Medicine

## 2020-05-14 ENCOUNTER — Telehealth: Payer: Self-pay | Admitting: *Deleted

## 2020-05-14 ENCOUNTER — Ambulatory Visit (INDEPENDENT_AMBULATORY_CARE_PROVIDER_SITE_OTHER): Payer: Medicare Other | Admitting: Family Medicine

## 2020-05-14 VITALS — BP 139/77 | HR 70 | Temp 98.2°F | Ht 73.0 in | Wt 232.2 lb

## 2020-05-14 DIAGNOSIS — N3000 Acute cystitis without hematuria: Secondary | ICD-10-CM

## 2020-05-14 DIAGNOSIS — I951 Orthostatic hypotension: Secondary | ICD-10-CM

## 2020-05-14 DIAGNOSIS — R531 Weakness: Secondary | ICD-10-CM

## 2020-05-14 DIAGNOSIS — R197 Diarrhea, unspecified: Secondary | ICD-10-CM

## 2020-05-14 DIAGNOSIS — D649 Anemia, unspecified: Secondary | ICD-10-CM | POA: Diagnosis not present

## 2020-05-14 DIAGNOSIS — E1140 Type 2 diabetes mellitus with diabetic neuropathy, unspecified: Secondary | ICD-10-CM

## 2020-05-14 DIAGNOSIS — Z7689 Persons encountering health services in other specified circumstances: Secondary | ICD-10-CM

## 2020-05-14 DIAGNOSIS — Z794 Long term (current) use of insulin: Secondary | ICD-10-CM | POA: Diagnosis not present

## 2020-05-14 DIAGNOSIS — R3915 Urgency of urination: Secondary | ICD-10-CM | POA: Diagnosis not present

## 2020-05-14 LAB — URINALYSIS, ROUTINE W REFLEX MICROSCOPIC
Bilirubin, UA: NEGATIVE
Ketones, UA: NEGATIVE
Nitrite, UA: NEGATIVE
Specific Gravity, UA: 1.015 (ref 1.005–1.030)
Urobilinogen, Ur: 0.2 mg/dL (ref 0.2–1.0)
pH, UA: 5.5 (ref 5.0–7.5)

## 2020-05-14 LAB — MICROSCOPIC EXAMINATION
Epithelial Cells (non renal): NONE SEEN /hpf (ref 0–10)
RBC, Urine: >30 /hpf — AB (ref 0–2)
WBC, UA: >30 /hpf — AB (ref 0–5)

## 2020-05-14 MED ORDER — CEPHALEXIN 500 MG PO CAPS: 500.0000 mg | ORAL_CAPSULE | Freq: Two times a day (BID) | ORAL | 0 refills | Status: AC

## 2020-05-14 NOTE — Patient Instructions (Signed)
Call Dr. Alyson Ingles at [facility] at [phone] for UTI.  Call Dr. [name] for cardiology.    Urinary Tract Infection, Adult  A urinary tract infection (UTI) is an infection of any part of the urinary tract. The urinary tract includes the kidneys, ureters, bladder, and urethra. These organs make, store, and get rid of urine in the body. An upper UTI affects the ureters and kidneys. A lower UTI affects the bladder and urethra. What are the causes? Most urinary tract infections are caused by bacteria in your genital area around your urethra, where urine leaves your body. These bacteria grow and cause inflammation of your urinary tract. What increases the risk? You are more likely to develop this condition if:  You have a urinary catheter that stays in place.  You are not able to control when you urinate or have a bowel movement (incontinence).  You are male and you: ? Use a spermicide or diaphragm for birth control. ? Have low estrogen levels. ? Are pregnant.  You have certain genes that increase your risk.  You are sexually active.  You take antibiotic medicines.  You have a condition that causes your flow of urine to slow down, such as: ? An enlarged prostate, if you are male. ? Blockage in your urethra. ? A kidney stone. ? A nerve condition that affects your bladder control (neurogenic bladder). ? Not getting enough to drink, or not urinating often.  You have certain medical conditions, such as: ? Diabetes. ? A weak disease-fighting system (immunesystem). ? Sickle cell disease. ? Gout. ? Spinal cord injury. What are the signs or symptoms? Symptoms of this condition include:  Needing to urinate right away (urgency).  Frequent urination. This may include small amounts of urine each time you urinate.  Pain or burning with urination.  Blood in the urine.  Urine that smells bad or unusual.  Trouble urinating.  Cloudy urine.  Vaginal discharge, if  you are male.  Pain in the abdomen or the lower back. You may also have:  Vomiting or a decreased appetite.  Confusion.  Irritability or tiredness.  A fever or chills.  Diarrhea. The first symptom in older adults may be confusion. In some cases, they may not have any symptoms until the infection has worsened. How is this diagnosed? This condition is diagnosed based on your medical history and a physical exam. You may also have other tests, including:  Urine tests.  Blood tests.  Tests for STIs (sexually transmitted infections). If you have had more than one UTI, a cystoscopy or imaging studies may be done to determine the cause of the infections. How is this treated? Treatment for this condition includes:  Antibiotic medicine.  Over-the-counter medicines to treat discomfort.  Drinking enough water to stay hydrated. If you have frequent infections or have other conditions such as a kidney stone, you may need to see a health care provider who specializes in the urinary tract (urologist). In rare cases, urinary tract infections can cause sepsis. Sepsis is a life-threatening condition that occurs when the body responds to an infection. Sepsis is treated in the hospital with IV antibiotics, fluids, and other medicines. Follow these instructions at home: Medicines  Take over-the-counter and prescription medicines only as told by your health care provider.  If you were prescribed an antibiotic medicine, take it as told by your health care provider. Do not stop using the antibiotic even if you start to feel better. General instructions  Make sure you: ?  Empty your bladder often and completely. Do not hold urine for long periods of time. ? Empty your bladder after sex. ? Wipe from front to back after urinating or having a bowel movement if you are male. Use each tissue only one time when you wipe.  Drink enough fluid to keep your urine pale yellow.  Keep all follow-up  visits. This is important.   Contact a health care provider if:  Your symptoms do not get better after 1-2 days.  Your symptoms go away and then return. Get help right away if:  You have severe pain in your back or your lower abdomen.  You have a fever or chills.  You have nausea or vomiting. Summary  A urinary tract infection (UTI) is an infection of any part of the urinary tract, which includes the kidneys, ureters, bladder, and urethra.  Most urinary tract infections are caused by bacteria in your genital area.  Treatment for this condition often includes antibiotic medicines.  If you were prescribed an antibiotic medicine, take it as told by your health care provider. Do not stop using the antibiotic even if you start to feel better.  Keep all follow-up visits. This is important. This information is not intended to replace advice given to you by your health care provider. Make sure you discuss any questions you have with your health care provider. Document Revised: 08/23/2019 Document Reviewed: 08/23/2019 Elsevier Patient Education  [place].

## 2020-05-14 NOTE — Progress Notes (Signed)
Established Patient Office Visit  Subjective:  Patient ID: [name], male    DOB: [birth year]  Age: 75 y.o. MRN: [id number]  CC:  Chief Complaint  Patient presents with  . Transitions Of Care    HPI [name] presents for TOC.  Today's visit was for Transitional Care Management.  The patient was discharged from [facility] on 05/02/20 with a primary diagnosis of generalized weakness, anemia, and orthostatic hypotension.   Contact with the patient and/or caregiver, by a clinical staff member, was made on 05/04/20 and was documented as a telephone encounter within the EMR.  Through chart review and discussion with the patient I have determined that management of their condition is of moderate complexity.   [name] presented with generalized weakness, orthostatic hypotension, possible UTI, and subacute anemia. He was given IVF but continued to have orthostatic hypotension. Weakness is thought to be due to dehydration from acute diarrhea, UTI, and anemia. Orthostatic hypotension thought do be due to poor PO intake and dehydration. He was given compression stocking. And seemed to be asymptomatic at discharge. He would instructed to stay hydrated, move slowly, and follow up with cardiology. FOBT was negative. He underwent EGD and suboptimally preppred colonoscopy on 4/8 without significant finding. GI requests to repeat outpatient. Hemoglobin was stable at 9 at discharge. UA was concerning for UTI. Urology was consulted and recommended Bactrim x 2 weeks. Recommend outpatient follow with urology. A1C was 8.1- started of levemir 5 untis daily. Lasix was held until cardiology outpatient follow up. Inpatient echo was stable without lasix.   He has not called cardiology or urology to schedule follow up appointment. GI has called him, but he refused to be seen. He did not start taking the levemir. He did complete Bactrim as prescribed. He did not start taking Protonix as prescribed because he  "does not have acid reflux" although he reports epigastric discomfort. He has been eating and drinking well. He has been wearing the compression stockings. He denies dizziness, shortness of breath, edema, weight gain, vomiting, diarrhea, bleeding, or chest pain. He does continue to have urinary urgency. Denies fever, dysuria, or flank pain.   Past Medical History:  Diagnosis Date  . Arthritis   . Atrial fibrillation ([place])   . BPH (benign prostatic hypertrophy) 04/15/2010  . Cataract   . Colon polyps   . DEPRESSION 01/22/2009  . Heart valve replaced   . HYPERCHOLESTEROLEMIA 01/22/2009  . HYPERTENSION 01/22/2009   Dr. [name]  . 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 Morbid obesity ([place])   . 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 Tremors of nervous system    ?ptsd  . Ulcer   . Vitreous hemorrhage of left eye ([place]) 08/27/2019    Past Surgical History:  Procedure Laterality Date  . 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 APPENDECTOMY    . bilateral cataract surg    . BIOPSY  05/01/2020   Procedure: BIOPSY;  Surgeon: Eloise Harman, DO;  [facility];  Service: Endoscopy;;  . COLONOSCOPY  04/24/2007   [name]: normal  . COLONOSCOPY WITH ESOPHAGOGASTRODUODENOSCOPY (EGD) N/A 04/05/2012   normal rectum, somewhat elongated and redundant colon, otherwise normal. Remote history of colon polyps. EGD: normal esophagus, bile-stained gastric mucosa, diffuse patchy gastric erythema, small duodenal bulbar ulcer with associated erosions. Negative H.pylori. H.pylori serology also negative.   . COLONOSCOPY WITH PROPOFOL N/A 05/01/2020  Procedure: COLONOSCOPY WITH PROPOFOL;  Surgeon: Eloise Harman, DO;  Location[facility];  Service: Endoscopy;  Laterality: N/A;  . DENTAL SURGERY  05/2004   Dental extractions  . ESOPHAGOGASTRODUODENOSCOPY (EGD) WITH PROPOFOL N/A 05/01/2020   Procedure:  ESOPHAGOGASTRODUODENOSCOPY (EGD) WITH PROPOFOL;  Surgeon: Eloise Harman, DO;  [facility];  Service: Endoscopy;  Laterality: N/A;  . EYE SURGERY Bilateral    cataract  . HEEL SPUR SURGERY Bilateral    resection of heel spur  . KNEE ARTHROSCOPY WITH LATERAL MENISECTOMY Right 04/04/2014   Procedure: KNEE ARTHROSCOPY WITH LATERAL MENISECTOMY;  Surgeon: Carole Civil, MD;  Location: [facility];  Service: Orthopedics;  Laterality: Right;  . PACEMAKER IMPLANT N/A 06/13/2016   Procedure: Pacemaker Implant- Dual Chamber;  Surgeon: Evans Lance, MD;  Location: [place] [facility];  Service: Cardiovascular;  Laterality: N/A;  . PACEMAKER INSERTION  2018  . THYROIDECTOMY  03/21/2013   DR [name]  . THYROIDECTOMY N/A 03/21/2013   Procedure: THYROIDECTOMY;  Surgeon: [name], MD;  Location: [place];  Service: General;  Laterality: N/A;  . TONSILLECTOMY      Family History  Problem Relation Age of Onset  . Heart disease Father   . Congestive Heart Failure Father   . Arthritis Father   . Diabetes Other   . Benign prostatic hyperplasia Brother   . Colon cancer Neg Hx   . Colon polyps Neg Hx     Social History   Socioeconomic History  . Marital status: Single    Spouse name: Not on file  . Number of children: 1  . Years of education: HS  . Highest education level: Not on file  Occupational History  . Occupation: Administrator, retired    Fish farm manager: RETIRED  Tobacco Use  . Smoking status: Former Smoker    Packs/day: 0.00    Years: 12.00    Pack years: 0.00    Types: Cigarettes    Quit date: 09/20/1961    Years since quitting: 58.6  . Smokeless tobacco: Never Used  Vaping Use  . Vaping Use: Never used  Substance and Sexual Activity  . Alcohol use: Not Currently  . Drug use: No  . Sexual activity: Never    Comment: Has not smoked in 20 years  Other Topics Concern  . Not on file  Social History Narrative   Lives alone.   Quit smoking approximately 28 years ago.    Long haul truck driver, lives in [place].   Social Determinants of Health   Financial Resource Strain: Not on file  Food Insecurity: Not on file  Transportation Needs: Not on file  Physical Activity: Not on file  Stress: Not on file  Social Connections: Not on file  Intimate Partner Violence: Not on file    Outpatient Medications Prior to Visit  Medication Sig Dispense Refill  . acetaminophen (TYLENOL) 500 MG tablet Take 1,000 mg by mouth every 6 (six) hours as needed (pain).    Marland Kitchen apixaban (ELIQUIS) 5 MG TABS tablet Take 1 tablet (5 mg total) by mouth 2 (two) times daily. 60 tablet 3  . cholecalciferol (VITAMIN D) 1000 units tablet Take 1,000 Units by mouth daily.     Marland Kitchen gabapentin (NEURONTIN) 300 MG capsule Take 1 capsule (300 mg total) by mouth 2 (two) times daily. 180 capsule 3  . glucose blood test strip 1 strip by Does not apply route 3 (three) times daily. Uses true metrix strips (diabetic club)  12  . hydrocortisone cream 1 % Apply 1 application topically 2 (two) times daily. 30 g 0  . insulin detemir (LEVEMIR FLEXTOUCH) 100 UNIT/ML FlexPen Inject 5 Units into the skin daily.    Marland Kitchen levothyroxine (SYNTHROID) 175 MCG tablet Take 1 tablet (175 mcg total) by mouth daily before breakfast. 90 tablet 3  . loperamide (IMODIUM A-D) 2 MG tablet Take 1 tablet (2 mg total) by mouth 4 (four) times daily as needed for diarrhea or loose stools. 30 tablet 0  . metFORMIN (GLUCOPHAGE) 1000 MG tablet TAKE 1 TABLET BY MOUTH TWICE DAILY WITH A MEAL. (Patient taking differently: Take 1,000 mg by mouth in the morning and at bedtime.) 180 tablet 0  . pantoprazole (PROTONIX) 40 MG tablet Take 1 tablet (40 mg total) by mouth daily before breakfast. 30 tablet 0  . Semaglutide, 1 MG/DOSE, (OZEMPIC, 1 MG/DOSE,) 2 MG/1.5ML SOPN Inject 1 mg into the skin once a week. 4.5 mL 3  . simvastatin (ZOCOR) 80 MG tablet Take 0.5 tablets (40 mg total) by mouth at bedtime. 15 tablet 0  . vitamin B-12 (CYANOCOBALAMIN) 500  MCG tablet take one tablet BY MOUTH DAILY     No facility-administered medications prior to visit.    Allergies  Allergen Reactions  . Phenothiazines Anaphylaxis  . Actos [Pioglitazone] Swelling    ROS Review of Systems As per HPI.    Objective:    Physical Exam Vitals and nursing note reviewed.  Constitutional:      General: He is not in acute distress.    Appearance: He is not ill-appearing, toxic-appearing or diaphoretic.  HENT:     Head: Normocephalic and atraumatic.  Cardiovascular:     Rate and Rhythm: Normal rate and regular rhythm.     Heart sounds: Normal heart sounds. No murmur heard.   Pulmonary:     Effort: Pulmonary effort is normal. No respiratory distress.     Breath sounds: Normal breath sounds. No wheezing, rhonchi or rales.  Abdominal:     General: Bowel sounds are normal. There is no distension.     Palpations: Abdomen is soft.     Tenderness: There is no abdominal tenderness. There is no right CVA tenderness, left CVA tenderness, guarding or rebound.  Musculoskeletal:     Right lower leg: No edema.     Left lower leg: No edema.  Skin:    General: Skin is warm and dry.  Neurological:     General: No focal deficit present.     Mental Status: He is alert and oriented to person, place, and time.  Psychiatric:        Mood and Affect: Mood normal.        Behavior: Behavior normal.     BP 139/77   Pulse 70   Temp 98.2 F (36.8 C) (Temporal)   Ht _0  (1.854 m)   Wt 232 lb 4 oz (105.3 kg)   BMI 30.64 kg/m  Wt Readings from Last 3 Encounters:  05/14/20 232 lb 4 oz (105.3 kg)  04/28/20 234 lb (106.1 kg)  04/01/20 243 lb (110.2 kg)     Health Maintenance Due  Topic Date Due  . COVID-19 Vaccine (1) Never done  . URINE MICROALBUMIN  12/11/2019    There are no preventive care reminders to display for this patient.  Lab Results  Component Value Date   TSH 1.557 03/03/2020   Lab Results  Component Value Date   WBC 7.1 05/02/2020    HGB  9.5 (L) 05/02/2020   HCT 29.3 (L) 05/02/2020   MCV 101.4 (H) 05/02/2020   PLT 147 (L) 05/02/2020   Lab Results  Component Value Date   NA 140 05/02/2020   K 4.2 05/02/2020   CO2 23 05/02/2020   GLUCOSE 181 (H) 05/02/2020   BUN 7 (L) 05/02/2020   CREATININE 1.05 05/02/2020   BILITOT 0.6 04/28/2020   ALKPHOS 61 04/28/2020   AST 18 04/28/2020   ALT 10 04/28/2020   PROT 6.6 04/28/2020   ALBUMIN 2.9 (L) 04/28/2020   CALCIUM 8.3 (L) 05/02/2020   ANIONGAP 10 05/02/2020   Lab Results  Component Value Date   CHOL 135 03/04/2020   Lab Results  Component Value Date   HDL 46 03/04/2020   Lab Results  Component Value Date   LDLCALC 74 03/04/2020   Lab Results  Component Value Date   TRIG 75 03/04/2020   Lab Results  Component Value Date   CHOLHDL 2.9 03/04/2020   Lab Results  Component Value Date   HGBA1C 8.1 (H) 02/17/2020      Assessment & Plan:   [name] was seen today for transitions of care.  Diagnoses and all orders for this visit:  Acute cystitis without hematuria UA today indicated continued infection. Culture pending. Recently completed bactrim. Start keflex as below. Patient was given contact information for Dr. Noland Fordyce office and instructed to schedule follow up appointment.  -     Urinalysis, Routine w reflex microscopic -     Microalbumin / creatinine urine ratio -     Urine Culture -     cephALEXin (KEFLEX) 500 MG capsule; Take 1 capsule (500 mg total) by mouth 2 (two) times daily for 10 days. -     Microscopic Examination  Generalized weakness Labs pending as below. Reports eating and drinking well.  -     CBC with Differential/Platelet -     BMP8+EGFR  Diarrhea, unspecified type Resolved. Labs pending as below.  -     CBC with Differential/Platelet -     BMP8+EGFR  Orthostatic hypotension Asymptomatic. Wearing compression stocking. Stay well hydrated, move slowly.   -     CBC with Differential/Platelet -     BMP8+EGFR  Anemia,  unspecified type Labs pending as below. Discussed why GI wants to repeat colonoscopy and encouraged patient to schedule appointment with GI.   -     CBC with Differential/Platelet -     BMP8+EGFR  Type 2 diabetes mellitus with diabetic neuropathy, with long-term current use of insulin (HCC)  Last a1c was 8.1, patient did not start levemir as ordered at discharge. Microalbumin order as it is overdue. Reminded patient to schedule follow up appointment with PCP -     Microalbumin / creatinine urine ratio  Encounter for support and coordination of transition of care Reviewed hospital records. Reiterated again the need for patient to schedule follows with cardiology, GI, and urology.  -     CBC with Differential/Platelet -     BMP8+EGFR    Follow-up: Return for schedule chronic follow up with PCP .   The patient indicates understanding of these issues and agrees with the plan.    Gwenlyn Perking, FNP

## 2020-05-14 NOTE — Chronic Care Management (AMB) (Signed)
  Chronic Care Management   Outreach Note  05/14/2020 Name: [name] MRN: [id number] DOB: [birth year]  [name] is a 75 y.o. year old male who is a primary care patient of Dettinger, Fransisca Kaufmann, MD. I reached out to [name] by phone today in response to a referral sent by Mr. Avid Guillette Milbourne's PCP, Dettinger, Fransisca Kaufmann, MD.     An unsuccessful telephone outreach was attempted today. The patient was referred to the case management team for assistance with care management and care coordination.   Follow Up Plan: A HIPAA compliant phone message was left for the patient providing contact information and requesting a return call. The care management team will reach out to the patient again over the next 5 days. If patient returns call to provider office, please advise to call [place] at [phone].  [facility] [facility]

## 2020-05-15 ENCOUNTER — Other Ambulatory Visit: Payer: Self-pay | Admitting: Family Medicine

## 2020-05-15 ENCOUNTER — Telehealth: Payer: Self-pay | Admitting: Family Medicine

## 2020-05-15 ENCOUNTER — Telehealth: Payer: Self-pay

## 2020-05-15 DIAGNOSIS — N390 Urinary tract infection, site not specified: Secondary | ICD-10-CM

## 2020-05-15 LAB — CBC WITH DIFFERENTIAL/PLATELET
Basophils Absolute: 0.1 10*3/uL (ref 0.0–0.2)
Basos: 1 %
EOS (ABSOLUTE): 0.2 10*3/uL (ref 0.0–0.4)
Eos: 3 %
Hematocrit: 29.9 % — ABNORMAL LOW (ref 37.5–51.0)
Hemoglobin: 9.8 g/dL — ABNORMAL LOW (ref 13.0–17.7)
Immature Grans (Abs): 0.1 10*3/uL (ref 0.0–0.1)
Immature Granulocytes: 1 %
Lymphocytes Absolute: 1.2 10*3/uL (ref 0.7–3.1)
Lymphs: 18 %
MCH: 33.4 pg — ABNORMAL HIGH (ref 26.6–33.0)
MCHC: 32.8 g/dL (ref 31.5–35.7)
MCV: 102 fL — ABNORMAL HIGH (ref 79–97)
Monocytes Absolute: 0.7 10*3/uL (ref 0.1–0.9)
Monocytes: 11 %
Neutrophils Absolute: 4.3 10*3/uL (ref 1.4–7.0)
Neutrophils: 66 %
Platelets: 163 10*3/uL (ref 150–450)
RBC: 2.93 x10E6/uL — ABNORMAL LOW (ref 4.14–5.80)
RDW: 16 % — ABNORMAL HIGH (ref 11.6–15.4)
WBC: 6.5 10*3/uL (ref 3.4–10.8)

## 2020-05-15 LAB — BMP8+EGFR
BUN/Creatinine Ratio: 13 (ref 10–24)
BUN: 21 mg/dL (ref 8–27)
CO2: 21 mmol/L (ref 20–29)
Calcium: 9.1 mg/dL (ref 8.6–10.2)
Chloride: 100 mmol/L (ref 96–106)
Creatinine, Ser: 1.59 mg/dL — ABNORMAL HIGH (ref 0.76–1.27)
Glucose: 195 mg/dL — ABNORMAL HIGH (ref 65–99)
Potassium: 5.6 mmol/L — ABNORMAL HIGH (ref 3.5–5.2)
Sodium: 135 mmol/L (ref 134–144)
eGFR: 45 mL/min/{1.73_m2} — ABNORMAL LOW (ref >59)

## 2020-05-15 LAB — MICROALBUMIN / CREATININE URINE RATIO
Creatinine, Urine: 69 mg/dL
Microalb/Creat Ratio: 184 mg/g creat — ABNORMAL HIGH (ref 0–29)
Microalbumin, Urine: 127.3 ug/mL

## 2020-05-15 NOTE — Telephone Encounter (Signed)
Referral placed.

## 2020-05-15 NOTE — Telephone Encounter (Signed)
Patient seen you this day please advise referral

## 2020-05-17 LAB — URINE CULTURE

## 2020-05-18 ENCOUNTER — Other Ambulatory Visit: Payer: Self-pay | Admitting: Family Medicine

## 2020-05-18 DIAGNOSIS — N3000 Acute cystitis without hematuria: Secondary | ICD-10-CM

## 2020-05-18 MED ORDER — CIPROFLOXACIN HCL 250 MG PO TABS: 250.0000 mg | ORAL_TABLET | Freq: Two times a day (BID) | ORAL | 0 refills | Status: AC

## 2020-05-19 ENCOUNTER — Telehealth: Payer: Self-pay | Admitting: *Deleted

## 2020-05-19 NOTE — Chronic Care Management (AMB) (Signed)
  Chronic Care Management   Note  05/19/2020 Name: [name] MRN: [id number] DOB: 11-21-1945  [name] is a 75 y.o. year old male who is a primary care patient of Dettinger, Fransisca Kaufmann, MD. I reached out to [name] by phone today in response to a referral sent by [name]'s PCP, Dettinger, Fransisca Kaufmann, MD     [name] was given information about Chronic Care Management services today including:  1. CCM service includes personalized support from designated clinical staff supervised by his physician, including individualized plan of care and coordination with other care providers 2. 24/7 contact phone numbers for assistance for urgent and routine care needs. 3. Service will only be billed when office clinical staff spend 20 minutes or more in a month to coordinate care. 4. Only one practitioner may furnish and bill the service in a calendar month. 5. The patient may stop CCM services at any time (effective at the end of the month) by phone call to the office staff. 6. The patient will be responsible for cost sharing (co-pay) of up to 20% of the service fee (after annual deductible is met).  Patient agreed to services and verbal consent obtained.   Follow up plan: Telephone appointment with care management team member scheduled for:05/27/2020  [facility]  Direct Dial: [phone]

## 2020-05-27 ENCOUNTER — Telehealth: Payer: Medicare Other

## 2020-05-27 ENCOUNTER — Ambulatory Visit: Payer: Medicare Other | Admitting: *Deleted

## 2020-05-27 DIAGNOSIS — I951 Orthostatic hypotension: Secondary | ICD-10-CM

## 2020-05-27 DIAGNOSIS — E1140 Type 2 diabetes mellitus with diabetic neuropathy, unspecified: Secondary | ICD-10-CM

## 2020-05-27 NOTE — Patient Instructions (Signed)
Follow Up Plan:  Patient has been removed from the CCM program per his request and can be added back if he desires assistance in the future.   Forwarding to PCP as an [place], BSN, RN-BC [place] [facility] Direct Dial: [phone]

## 2020-05-27 NOTE — Chronic Care Management (AMB) (Signed)
   05/27/2020  [name] 31-Aug-1945 [id number]  Patient was contacted for an Initial CCM visit after receiving a referral for Chronic Care Management and Care Coordination services at recent hospital discharge. After an explanation of the program and reason for the referral was given, patient refused CCM services and disconnected the call by saying, "I don't need any of this" and then he hung up.   Follow Up Plan:  Patient has been removed from the CCM program per his request and can be added back if he desires assistance in the future.   Forwarding to PCP as an [place], BSN, RN-BC [place] [facility] [place] [facility] Direct Dial: [phone]

## 2020-06-02 ENCOUNTER — Encounter (INDEPENDENT_AMBULATORY_CARE_PROVIDER_SITE_OTHER): Payer: No Typology Code available for payment source | Admitting: Ophthalmology

## 2020-06-02 ENCOUNTER — Ambulatory Visit (INDEPENDENT_AMBULATORY_CARE_PROVIDER_SITE_OTHER): Payer: Medicare Other | Admitting: Ophthalmology

## 2020-06-02 ENCOUNTER — Other Ambulatory Visit: Payer: Self-pay

## 2020-06-02 ENCOUNTER — Encounter (INDEPENDENT_AMBULATORY_CARE_PROVIDER_SITE_OTHER): Payer: Self-pay | Admitting: Ophthalmology

## 2020-06-02 DIAGNOSIS — H353211 Exudative age-related macular degeneration, right eye, with active choroidal neovascularization: Secondary | ICD-10-CM

## 2020-06-02 DIAGNOSIS — E113491 Type 2 diabetes mellitus with severe nonproliferative diabetic retinopathy without macular edema, right eye: Secondary | ICD-10-CM

## 2020-06-02 NOTE — Progress Notes (Signed)
06/02/2020     CHIEF COMPLAINT Patient presents for Retina Follow Up (6 week fu OD and Avastin OD/Pt states VA OU stable since last visit. Pt denies FOL, floaters, or ocular pain OU. /A1C:6.8/LBS:245)   HISTORY OF PRESENT ILLNESS: [name] is a 75 y.o. male who presents to the clinic today for:   HPI    Retina Follow Up    Diagnosis: Wet AMD   Laterality: right eye   Onset: 6 weeks ago   Severity: mild   Duration: 6 weeks   Course: stable   Comments: 6 week fu OD and Avastin OD Pt states VA OU stable since last visit. Pt denies FOL, floaters, or ocular pain OU.  A1C:6.8 ZOX:096       Last edited by Kendra Opitz, [place] on 06/02/2020 10:32 AM. (History)      Referring physician: Dettinger, Fransisca Kaufmann, MD [place],  [place] [address]  HISTORICAL INFORMATION:   Selected notes from the MEDICAL RECORD NUMBER    Lab Results  Component Value Date   HGBA1C 8.1 (H) 02/17/2020     CURRENT MEDICATIONS: No current outpatient medications on file. (Ophthalmic Drugs)   No current facility-administered medications for this visit. (Ophthalmic Drugs)   Current Outpatient Medications (Other)  Medication Sig  . acetaminophen (TYLENOL) 500 MG tablet Take 1,000 mg by mouth every 6 (six) hours as needed (pain).  Marland Kitchen apixaban (ELIQUIS) 5 MG TABS tablet Take 1 tablet (5 mg total) by mouth 2 (two) times daily.  . cholecalciferol (VITAMIN D) 1000 units tablet Take 1,000 Units by mouth daily.   Marland Kitchen gabapentin (NEURONTIN) 300 MG capsule Take 1 capsule (300 mg total) by mouth 2 (two) times daily.  Marland Kitchen glucose blood test strip 1 strip by Does not apply route 3 (three) times daily. Uses true metrix strips (diabetic club)  . hydrocortisone cream 1 % Apply 1 application topically 2 (two) times daily.  . insulin detemir (LEVEMIR FLEXTOUCH) 100 UNIT/ML FlexPen Inject 5 Units into the skin daily.  Marland Kitchen levothyroxine (SYNTHROID) 175 MCG tablet Take 1 tablet (175 mcg total) by mouth daily  before breakfast.  . loperamide (IMODIUM A-D) 2 MG tablet Take 1 tablet (2 mg total) by mouth 4 (four) times daily as needed for diarrhea or loose stools.  . metFORMIN (GLUCOPHAGE) 1000 MG tablet TAKE 1 TABLET BY MOUTH TWICE DAILY WITH A MEAL. (Patient taking differently: Take 1,000 mg by mouth in the morning and at bedtime.)  . pantoprazole (PROTONIX) 40 MG tablet Take 1 tablet (40 mg total) by mouth daily before breakfast.  . Semaglutide, 1 MG/DOSE, (OZEMPIC, 1 MG/DOSE,) 2 MG/1.5ML SOPN Inject 1 mg into the skin once a week.  . simvastatin (ZOCOR) 80 MG tablet Take 0.5 tablets (40 mg total) by mouth at bedtime.  . vitamin B-12 (CYANOCOBALAMIN) 500 MCG tablet take one tablet BY MOUTH DAILY   No current facility-administered medications for this visit. (Other)      REVIEW OF SYSTEMS:    ALLERGIES Allergies  Allergen Reactions  . Phenothiazines Anaphylaxis  . Actos [Pioglitazone] Swelling    PAST MEDICAL HISTORY Past Medical History:  Diagnosis Date  . Arthritis   . Atrial fibrillation ([place])   . BPH (benign prostatic hypertrophy) 04/15/2010  . Cataract   . Colon polyps   . DEPRESSION 01/22/2009  . Heart valve replaced   . HYPERCHOLESTEROLEMIA 01/22/2009  . HYPERTENSION 01/22/2009   Dr. [name]  . 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 IDDM  01/22/2009  . Morbid obesity ([place])   . Severe nonproliferative diabetic retinopathy of both eyes ([place]) 08/27/2019  . Tremors of nervous system    ?ptsd  . Ulcer   . Vitreous hemorrhage of left eye ([place]) 08/27/2019   Past Surgical History:  Procedure Laterality Date  . 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 APPENDECTOMY    . bilateral cataract surg    . BIOPSY  05/01/2020   Procedure: BIOPSY;  Surgeon: Eloise Harman, DO;  [facility];  Service: Endoscopy;;  . COLONOSCOPY  04/24/2007   [name]: normal  . COLONOSCOPY WITH ESOPHAGOGASTRODUODENOSCOPY (EGD) N/A 04/05/2012   normal  rectum, somewhat elongated and redundant colon, otherwise normal. Remote history of colon polyps. EGD: normal esophagus, bile-stained gastric mucosa, diffuse patchy gastric erythema, small duodenal bulbar ulcer with associated erosions. Negative H.pylori. H.pylori serology also negative.   . COLONOSCOPY WITH PROPOFOL N/A 05/01/2020   Procedure: COLONOSCOPY WITH PROPOFOL;  Surgeon: Eloise Harman, DO;  [facility];  Service: Endoscopy;  Laterality: N/A;  . DENTAL SURGERY  05/2004   Dental extractions  . ESOPHAGOGASTRODUODENOSCOPY (EGD) WITH PROPOFOL N/A 05/01/2020   Procedure: ESOPHAGOGASTRODUODENOSCOPY (EGD) WITH PROPOFOL;  Surgeon: Eloise Harman, DO;  [facility];  Service: Endoscopy;  Laterality: N/A;  . EYE SURGERY Bilateral    cataract  . HEEL SPUR SURGERY Bilateral    resection of heel spur  . KNEE ARTHROSCOPY WITH LATERAL MENISECTOMY Right 04/04/2014   Procedure: KNEE ARTHROSCOPY WITH LATERAL MENISECTOMY;  Surgeon: Carole Civil, MD;  Location: [facility];  Service: Orthopedics;  Laterality: Right;  . PACEMAKER IMPLANT N/A 06/13/2016   Procedure: Pacemaker Implant- Dual Chamber;  Surgeon: Evans Lance, MD;  Location: [facility];  Service: Cardiovascular;  Laterality: N/A;  . PACEMAKER INSERTION  2018  . THYROIDECTOMY  03/21/2013   DR [name]  . THYROIDECTOMY N/A 03/21/2013   Procedure: THYROIDECTOMY;  Surgeon: [name], MD;  Location: [facility];  Service: General;  Laterality: N/A;  . TONSILLECTOMY      FAMILY HISTORY Family History  Problem Relation Age of Onset  . Heart disease Father   . Congestive Heart Failure Father   . Arthritis Father   . Diabetes Other   . Benign prostatic hyperplasia Brother   . Colon cancer Neg Hx   . Colon polyps Neg Hx     SOCIAL HISTORY Social History   Tobacco Use  . Smoking status: Former Smoker    Packs/day: 0.00    Years: 12.00    Pack years: 0.00    Types: Cigarettes    Quit date: 09/20/1961     Years since quitting: 58.7  . Smokeless tobacco: Never Used  Vaping Use  . Vaping Use: Never used  Substance Use Topics  . Alcohol use: Not Currently  . Drug use: No         OPHTHALMIC EXAM: Base Eye Exam    Visual Acuity (ETDRS)      Right Left   Dist cc 20/40 20/30 -2   Dist ph cc NI 20/25 -1   Correction: Glasses       Tonometry (Tonopen, 10:36 AM)      Right Left   Pressure 12 13       Pupils      Pupils Dark Light Shape React APD   Right PERRL 4 4 Round Minimal None   Left PERRL 4 4  Round Minimal None       Visual Fields (Counting fingers)      Left Right    Full        Extraocular Movement      Right Left    Full Full       Neuro/Psych    Oriented x3: Yes   Mood/Affect: Normal       Dilation    Right eye: 1.0% Mydriacyl, 2.5% Phenylephrine @ 10:36 AM        Slit Lamp and Fundus Exam    External Exam      Right Left   External Normal Normal       Slit Lamp Exam      Right Left   Lids/Lashes Normal Normal   Conjunctiva/Sclera White and quiet White and quiet   Cornea Clear Clear   Anterior Chamber Deep and quiet Deep and quiet   Iris  patent PI sup Round and reactive   Lens Centered posterior chamber intraocular lens, Open posterior capsule Centered posterior chamber intraocular lens   Anterior Vitreous Normal Normal       Fundus Exam      Right Left   Posterior Vitreous Posterior vitreous detachment Not dilated   Disc Normal    C/D Ratio 0.1    Macula Normal, no clinical subretinal fluid noted.  Peri papillary    Vessels NPDR-Severe    Periphery moderate scatter pattern prp            IMAGING AND PROCEDURES  Imaging and Procedures for 06/02/20  OCT, Retina - OU - Both Eyes       Right Eye Quality was good. Scan locations included subfoveal. Central Foveal Thickness: 302. Progression has worsened. Findings include subretinal fluid, abnormal foveal contour.   Left Eye Quality was good. Scan locations included subfoveal.  Central Foveal Thickness: 275. Progression has been stable. Findings include retinal drusen , abnormal foveal contour.   Notes Much less subretinal fluid in a PERI papillary configuration likely represents peripapillary CNVM which is not evident clinical examination.  Only on OCT do we see this OD  OS no signs of active disease                  ASSESSMENT/PLAN:  Exudative age-related macular degeneration of right eye with active choroidal neovascularization (HCC) PERI papillary CNVM with subretinal fluid much less active today at 6-week follow-up post injection Avastin.  This lesion is not seen clinically but is only detected on OCT and is now much less active we will repeat injection today Avastin intravitreal  Severe nonproliferative diabetic retinopathy of right eye ([place]) No progression today      ICD-10-CM   1. Exudative age-related macular degeneration of right eye with active choroidal neovascularization (HCC)  H35.3211 OCT, Retina - OU - Both Eyes  2. 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  Severe NPDR, no progression OD  2.  PERI papillary CNVM with subretinal fluid much improved post injection Avastin 6 weeks previous.  Repeat injection today  3.  Ophthalmic Meds Ordered this visit:  No orders of the defined types were placed in this encounter.      Return in about 6 weeks (around 07/14/2020) for dilate, OD, AVASTIN OCT.  There are no Patient Instructions on file for this visit.   Explained the diagnoses, plan, and follow up with the patient and they  expressed understanding.  Patient expressed understanding of the importance of proper follow up care.   Clent Demark Hayk Divis M.D. Diseases & Surgery of the Retina and Vitreous [facility] 06/02/20     Abbreviations: M myopia (nearsighted); A astigmatism; H hyperopia (farsighted); P  presbyopia; Mrx spectacle prescription;  CTL contact lenses; OD right eye; OS left eye; OU both eyes  XT exotropia; ET esotropia; PEK punctate epithelial keratitis; PEE punctate epithelial erosions; DES dry eye syndrome; MGD meibomian gland dysfunction; ATs artificial tears; PFAT's preservative free artificial tears; [place] nuclear sclerotic cataract; PSC posterior subcapsular cataract; ERM epi-retinal membrane; PVD posterior vitreous detachment; RD retinal detachment; DM diabetes mellitus; DR diabetic retinopathy; NPDR non-proliferative diabetic retinopathy; PDR proliferative diabetic retinopathy; CSME clinically significant macular edema; DME diabetic macular edema; dbh dot blot hemorrhages; CWS cotton wool spot; POAG primary open angle glaucoma; C/D cup-to-disc ratio; HVF humphrey visual field; GVF goldmann visual field; OCT optical coherence tomography; IOP intraocular pressure; BRVO Branch retinal vein occlusion; CRVO central retinal vein occlusion; CRAO central retinal artery occlusion; BRAO branch retinal artery occlusion; RT retinal tear; SB scleral buckle; PPV pars plana vitrectomy; VH Vitreous hemorrhage; PRP panretinal laser photocoagulation; IVK intravitreal kenalog; VMT vitreomacular traction; MH Macular hole;  NVD neovascularization of the disc; NVE neovascularization elsewhere; AREDS age related eye disease study; ARMD age related macular degeneration; POAG primary open angle glaucoma; EBMD epithelial/anterior basement membrane dystrophy; ACIOL anterior chamber intraocular lens; IOL intraocular lens; PCIOL posterior chamber intraocular lens; Phaco/IOL phacoemulsification with intraocular lens placement; [place] photorefractive keratectomy; LASIK laser assisted in situ keratomileusis; HTN hypertension; DM diabetes mellitus; COPD chronic obstructive pulmonary disease

## 2020-06-02 NOTE — Assessment & Plan Note (Signed)
No progression today

## 2020-06-02 NOTE — Assessment & Plan Note (Signed)
PERI papillary CNVM with subretinal fluid much less active today at 6-week follow-up post injection Avastin.  This lesion is not seen clinically but is only detected on OCT and is now much less active we will repeat injection today Avastin intravitreal

## 2020-06-08 ENCOUNTER — Other Ambulatory Visit: Payer: Self-pay

## 2020-06-08 ENCOUNTER — Ambulatory Visit (INDEPENDENT_AMBULATORY_CARE_PROVIDER_SITE_OTHER): Payer: Medicare Other | Admitting: Family Medicine

## 2020-06-08 ENCOUNTER — Encounter: Payer: Self-pay | Admitting: Family Medicine

## 2020-06-08 VITALS — BP 157/73 | HR 81 | Ht 73.0 in | Wt 234.0 lb

## 2020-06-08 DIAGNOSIS — R399 Unspecified symptoms and signs involving the genitourinary system: Secondary | ICD-10-CM | POA: Diagnosis not present

## 2020-06-08 LAB — URINALYSIS
Bilirubin, UA: NEGATIVE
Ketones, UA: NEGATIVE
Nitrite, UA: NEGATIVE
Specific Gravity, UA: 1.015 (ref 1.005–1.030)
Urobilinogen, Ur: 0.2 mg/dL (ref 0.2–1.0)
pH, UA: 5.5 (ref 5.0–7.5)

## 2020-06-08 NOTE — Progress Notes (Signed)
BP (!) 157/73   Pulse 81   Ht 6\' 1"  (1.854 m)   Wt 234 lb (106.1 kg)   SpO2 98%   BMI 30.87 kg/m    Subjective:   Patient ID: [name], male    DOB: [birth year], 75 y.o.   MRN: [id number]  HPI: [name] is a 75 y.o. male presenting on 06/08/2020 for Urinary Tract Infection   HPI UTI symptoms Patient is coming in today with frequency and dysuria that has been there for the past 3 weeks. He denies any fevers or chills. He does admit to having lower abdominal discomfort.  He does not have any other symptoms but since he gets UTIs still frequently he is concerned about it.  Relevant past medical, surgical, family and social history reviewed and updated as indicated. Interim medical history since our last visit reviewed. Allergies and medications reviewed and updated.  Review of Systems  Constitutional: Negative for chills and fever.  Respiratory: Negative for shortness of breath and wheezing.   Cardiovascular: Negative for chest pain and leg swelling.  Gastrointestinal: Positive for abdominal pain. Negative for abdominal distention.  Genitourinary: Positive for dysuria, frequency and urgency. Negative for flank pain and hematuria.  Musculoskeletal: Negative for back pain and gait problem.  Skin: Negative for rash.  All other systems reviewed and are negative.   Per HPI unless specifically indicated above   Allergies as of 06/08/2020      Reactions   Phenothiazines Anaphylaxis   Actos [pioglitazone] Swelling      Medication List       Accurate as of Jun 08, 2020 11:57 AM. If you have any questions, ask your nurse or doctor.        acetaminophen 500 MG tablet Commonly known as: TYLENOL Take 1,000 mg by mouth every 6 (six) hours as needed (pain).   apixaban 5 MG Tabs tablet Commonly known as: Eliquis Take 1 tablet (5 mg total) by mouth 2 (two) times daily.   cholecalciferol 1000 units tablet Commonly known as: VITAMIN D Take 1,000 Units by mouth  daily.   gabapentin 300 MG capsule Commonly known as: NEURONTIN Take 1 capsule (300 mg total) by mouth 2 (two) times daily.   glucose blood test strip 1 strip by Does not apply route 3 (three) times daily. Uses true metrix strips (diabetic club)   hydrocortisone cream 1 % Apply 1 application topically 2 (two) times daily.   Levemir FlexTouch 100 UNIT/ML FlexPen Generic drug: insulin detemir Inject 5 Units into the skin daily.   levothyroxine 175 MCG tablet Commonly known as: SYNTHROID Take 1 tablet (175 mcg total) by mouth daily before breakfast.   loperamide 2 MG tablet Commonly known as: Imodium A-D Take 1 tablet (2 mg total) by mouth 4 (four) times daily as needed for diarrhea or loose stools.   metFORMIN 1000 MG tablet Commonly known as: GLUCOPHAGE TAKE 1 TABLET BY MOUTH TWICE DAILY WITH A MEAL. What changed: when to take this   Ozempic (1 MG/DOSE) 2 MG/1.5ML Sopn Generic drug: Semaglutide (1 MG/DOSE) Inject 1 mg into the skin once a week.   pantoprazole 40 MG tablet Commonly known as: PROTONIX Take 1 tablet (40 mg total) by mouth daily before breakfast.   simvastatin 80 MG tablet Commonly known as: ZOCOR Take 0.5 tablets (40 mg total) by mouth at bedtime.   vitamin B-12 500 MCG tablet Commonly known as: CYANOCOBALAMIN take one tablet BY MOUTH DAILY  Objective:   BP (!) 157/73   Pulse 81   Ht 6\' 1"  (1.854 m)   Wt 234 lb (106.1 kg)   SpO2 98%   BMI 30.87 kg/m   Wt Readings from Last 3 Encounters:  06/08/20 234 lb (106.1 kg)  05/14/20 232 lb 4 oz (105.3 kg)  04/28/20 234 lb (106.1 kg)    Physical Exam Vitals and nursing note reviewed.  Constitutional:      General: He is not in acute distress.    Appearance: He is well-developed. He is not diaphoretic.  Neck:     Thyroid: No thyromegaly.  Abdominal:     General: Abdomen is flat. Bowel sounds are normal. There is no distension.     Tenderness: There is no abdominal tenderness. There is no  guarding or rebound.  Skin:    General: Skin is warm and dry.     Findings: No rash.  Neurological:     Mental Status: He is alert and oriented to person, place, and time.     Coordination: Coordination normal.  Psychiatric:        Behavior: Behavior normal.      Urinalysis: 1+ glucose 1+ blood and 1+ protein 3+ leukocytes, commonly has this. Assessment & Plan:   Problem List Items Addressed This Visit   None   Visit Diagnoses    UTI symptoms    -  Primary   Relevant Orders   Urine Culture   Urinalysis      Will await urine culture because he frequently has the symptoms only been going on for 3 weeks. Follow up plan: No follow-ups on file.  Counseling provided for all of the vaccine components Orders Placed This Encounter  Procedures  . Urine Culture  . Urinalysis    Caryl Pina, MD [facility] 06/08/2020, 11:57 AM

## 2020-06-09 ENCOUNTER — Ambulatory Visit: Payer: No Typology Code available for payment source | Admitting: Gastroenterology

## 2020-06-11 LAB — URINE CULTURE

## 2020-06-12 ENCOUNTER — Other Ambulatory Visit: Payer: Self-pay | Admitting: Family Medicine

## 2020-06-12 MED ORDER — CIPROFLOXACIN HCL 500 MG PO TABS: 500.0000 mg | ORAL_TABLET | Freq: Two times a day (BID) | ORAL | 0 refills | Status: DC

## 2020-06-12 NOTE — Progress Notes (Signed)
Patient's urine culture grew Klebsiella, sent Cipro for him

## 2020-06-12 NOTE — Progress Notes (Signed)
Called pt on both numbers listed. No answer and no voicemail on home number. Cell number went straight to voicemail but mailbox is full. Mailed results to pt and informed that Dr. Warrick Parisian has sent in an atb for pt to take that will get rid of the infection.

## 2020-06-16 ENCOUNTER — Telehealth: Payer: Self-pay | Admitting: Family Medicine

## 2020-06-16 ENCOUNTER — Telehealth: Payer: Self-pay

## 2020-06-16 NOTE — Telephone Encounter (Signed)
Patient was prescribed Cipro on 06/12/20 for UTI.  He reports he is having penile pain now and is unsure if he should just wait and continue the Cipro or if he needs to be seen.  Please advise.

## 2020-06-16 NOTE — Telephone Encounter (Signed)
Nvm box set up

## 2020-06-16 NOTE — Telephone Encounter (Signed)
According to his last urine culture, cipro should take care of UTI. The other medication would not work. He can repeat urine test after he completes coarse of antibiotics or if symptoms worsen or do not improve.

## 2020-06-17 NOTE — Telephone Encounter (Signed)
Opened in error

## 2020-06-27 ENCOUNTER — Ambulatory Visit
Admission: EM | Admit: 2020-06-27 | Discharge: 2020-06-27 | Disposition: A | Discharge status: Home / Self Care | Payer: No Typology Code available for payment source | Attending: Emergency Medicine | Admitting: Emergency Medicine

## 2020-06-27 DIAGNOSIS — R300 Dysuria: Secondary | ICD-10-CM | POA: Diagnosis not present

## 2020-06-27 LAB — POCT URINALYSIS DIP (MANUAL ENTRY)
Bilirubin, UA: NEGATIVE
Glucose, UA: 100 mg/dL — AB
Ketones, POC UA: NEGATIVE mg/dL
Nitrite, UA: NEGATIVE
Spec Grav, UA: 1.025 (ref 1.010–1.025)
Urobilinogen, UA: 0.2 E.U./dL
pH, UA: 6 (ref 5.0–8.0)

## 2020-06-27 MED ORDER — SULFAMETHOXAZOLE-TRIMETHOPRIM 800-160 MG PO TABS: 1.0000 | ORAL_TABLET | Freq: Two times a day (BID) | ORAL | 0 refills | Status: AC

## 2020-06-27 NOTE — Discharge Instructions (Addendum)
Urine culture sent.  We will call you with the results.   Push fluids and get plenty of rest.  Bactrim DS was prescribed Take antibiotic as directed and to completion Follow up with PCP if symptoms persists Return here or go to ER if you have any new or worsening symptoms such as fever, worsening abdominal pain, nausea/vomiting, flank pain, etc..Marland Kitchen

## 2020-06-27 NOTE — ED Provider Notes (Signed)
[facility]   CC: Burning with urination  SUBJECTIVE:  [name] is a 75 y.o. male who presented to the urgent care for complaint of dysuria for the past 2 weeks.  Patient denies a precipitating event, recent sexual encounter, excessive caffeine intake.   Pain is intermitten and describes it as  burning.  Has tried OTC medications without relief.  Symptoms are made worse with urination.  Admits to similar symptoms in the past.  Denies fever, chills, nausea, vomiting, abdominal pain, flank pain, abnormal vaginal discharge or bleeding, hematuria.    LMP: No LMP for male patient.  ROS: As in HPI.  All other pertinent ROS negative.     Past Medical History:  Diagnosis Date  . Arthritis   . Atrial fibrillation ([place])   . BPH (benign prostatic hypertrophy) 04/15/2010  . Cataract   . Colon polyps   . DEPRESSION 01/22/2009  . Heart valve replaced   . HYPERCHOLESTEROLEMIA 01/22/2009  . HYPERTENSION 01/22/2009   Dr. [name]  . 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 Morbid obesity ([place])   . 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 Tremors of nervous system    ?ptsd  . Ulcer   . Vitreous hemorrhage of left eye ([place]) 08/27/2019   Past Surgical History:  Procedure Laterality Date  . 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 APPENDECTOMY    . bilateral cataract surg    . BIOPSY  05/01/2020   Procedure: BIOPSY;  Surgeon: Eloise Harman, DO;  Location: [facility];  Service: Endoscopy;;  . COLONOSCOPY  04/24/2007   [name]: normal  . COLONOSCOPY WITH ESOPHAGOGASTRODUODENOSCOPY (EGD) N/A 04/05/2012   normal rectum, somewhat elongated and redundant colon, otherwise normal. Remote history of colon polyps. EGD: normal esophagus, bile-stained gastric mucosa, diffuse patchy gastric erythema, small duodenal bulbar ulcer with associated erosions. Negative H.pylori. H.pylori serology also  negative.   . COLONOSCOPY WITH PROPOFOL N/A 05/01/2020   Procedure: COLONOSCOPY WITH PROPOFOL;  Surgeon: Eloise Harman, DO;  Location[facility];  Service: Endoscopy;  Laterality: N/A;  . DENTAL SURGERY  05/2004   Dental extractions  . ESOPHAGOGASTRODUODENOSCOPY (EGD) WITH PROPOFOL N/A 05/01/2020   Procedure: ESOPHAGOGASTRODUODENOSCOPY (EGD) WITH PROPOFOL;  Surgeon: Eloise Harman, DO;  [facility];  Service: Endoscopy;  Laterality: N/A;  . EYE SURGERY Bilateral    cataract  . HEEL SPUR SURGERY Bilateral    resection of heel spur  . KNEE ARTHROSCOPY WITH LATERAL MENISECTOMY Right 04/04/2014   Procedure: KNEE ARTHROSCOPY WITH LATERAL MENISECTOMY;  Surgeon: Carole Civil, MD;  Location: [facility];  Service: Orthopedics;  Laterality: Right;  . PACEMAKER IMPLANT N/A 06/13/2016   Procedure: Pacemaker Implant- Dual Chamber;  Surgeon: Evans Lance, MD;  Location: [facility] [place] [facility];  Service: Cardiovascular;  Laterality: N/A;  . PACEMAKER INSERTION  2018  . THYROIDECTOMY  03/21/2013   DR [name]  . THYROIDECTOMY N/A 03/21/2013   Procedure: THYROIDECTOMY;  Surgeon: [name], MD;  Location: [place];  Service: General;  Laterality: N/A;  . TONSILLECTOMY     Allergies  Allergen Reactions  . Phenothiazines Anaphylaxis  . Actos [Pioglitazone] Swelling   No current facility-administered medications on file prior to encounter.   Current Outpatient Medications on File Prior to Encounter  Medication Sig Dispense Refill  . acetaminophen (TYLENOL) 500 MG tablet Take 1,000 mg by mouth every 6 (six) hours  as needed (pain).    Marland Kitchen apixaban (ELIQUIS) 5 MG TABS tablet Take 1 tablet (5 mg total) by mouth 2 (two) times daily. 60 tablet 3  . cholecalciferol (VITAMIN D) 1000 units tablet Take 1,000 Units by mouth daily.     . ciprofloxacin (CIPRO) 500 MG tablet Take 1 tablet (500 mg total) by mouth 2 (two) times daily. 20 tablet 0  . gabapentin (NEURONTIN) 300 MG capsule Take 1 capsule  (300 mg total) by mouth 2 (two) times daily. 180 capsule 3  . glucose blood test strip 1 strip by Does not apply route 3 (three) times daily. Uses true metrix strips (diabetic club)  12  . hydrocortisone cream 1 % Apply 1 application topically 2 (two) times daily. 30 g 0  . insulin detemir (LEVEMIR FLEXTOUCH) 100 UNIT/ML FlexPen Inject 5 Units into the skin daily.    Marland Kitchen levothyroxine (SYNTHROID) 175 MCG tablet Take 1 tablet (175 mcg total) by mouth daily before breakfast. 90 tablet 3  . loperamide (IMODIUM A-D) 2 MG tablet Take 1 tablet (2 mg total) by mouth 4 (four) times daily as needed for diarrhea or loose stools. 30 tablet 0  . metFORMIN (GLUCOPHAGE) 1000 MG tablet TAKE 1 TABLET BY MOUTH TWICE DAILY WITH A MEAL. (Patient taking differently: Take 1,000 mg by mouth in the morning and at bedtime.) 180 tablet 0  . pantoprazole (PROTONIX) 40 MG tablet Take 1 tablet (40 mg total) by mouth daily before breakfast. 30 tablet 0  . Semaglutide, 1 MG/DOSE, (OZEMPIC, 1 MG/DOSE,) 2 MG/1.5ML SOPN Inject 1 mg into the skin once a week. 4.5 mL 3  . simvastatin (ZOCOR) 80 MG tablet Take 0.5 tablets (40 mg total) by mouth at bedtime. 15 tablet 0  . vitamin B-12 (CYANOCOBALAMIN) 500 MCG tablet take one tablet BY MOUTH DAILY     Social History   Socioeconomic History  . Marital status: Single    Spouse name: Not on file  . Number of children: 1  . Years of education: HS  . Highest education level: Not on file  Occupational History  . Occupation: Administrator, retired    Fish farm manager: RETIRED  Tobacco Use  . Smoking status: Former Smoker    Packs/day: 0.00    Years: 12.00    Pack years: 0.00    Types: Cigarettes    Quit date: 09/20/1961    Years since quitting: 58.8  . Smokeless tobacco: Never Used  Vaping Use  . Vaping Use: Never used  Substance and Sexual Activity  . Alcohol use: Not Currently  . Drug use: No  . Sexual activity: Never    Comment: Has not smoked in 20 years  Other Topics Concern  .  Not on file  Social History Narrative   Lives alone.   Quit smoking approximately 28 years ago.   Long haul truck driver, lives in [place].   Social Determinants of Health   Financial Resource Strain: Not on file  Food Insecurity: Not on file  Transportation Needs: Not on file  Physical Activity: Not on file  Stress: Not on file  Social Connections: Not on file  Intimate Partner Violence: Not on file   Family History  Problem Relation Age of Onset  . Heart disease Father   . Congestive Heart Failure Father   . Arthritis Father   . Diabetes Other   . Benign prostatic hyperplasia Brother   . Colon cancer Neg Hx   . Colon polyps Neg Hx  OBJECTIVE:  Vitals:   06/27/20 0958  BP: (!) 117/55  Pulse: 71  Resp: 18  Temp: 98.4 F (36.9 C)  SpO2: 98%   General appearance: AOx3 in no acute distress HEENT: NCAT.  Oropharynx clear.  Lungs: clear to auscultation bilaterally without adventitious breath sounds Heart: regular rate and rhythm.  Radial pulses 2+ symmetrical bilaterally Abdomen: soft; non-distended; no tenderness; bowel sounds present; no guarding or rebound tenderness Back: no CVA tenderness Extremities: no edema; symmetrical with no gross deformities Skin: warm and dry Neurologic: Ambulates from chair to exam table without difficulty Psychological: alert and cooperative; normal mood and affect  Labs Reviewed  POCT URINALYSIS DIP (MANUAL ENTRY) - Abnormal; Notable for the following components:      Result Value   Clarity, UA cloudy (*)    Glucose, UA =100 (*)    Blood, UA small (*)    Protein Ur, POC trace (*)    Leukocytes, UA Large (3+) (*)    All other components within normal limits  URINE CULTURE    ASSESSMENT & PLAN:  1. Dysuria     No orders of the defined types were placed in this encounter.  Patient is stable at discharge.  UTI versus prostatitis.  Will prescribe Bactrim DS. Will await urine culture.  Discharge instructions  Urine  culture sent.  We will call you with the results.   Push fluids and get plenty of rest.  Bactrim DS was prescribed Take antibiotic as directed and to completion Follow up with PCP if symptoms persists Return here or go to ER if you have any new or worsening symptoms such as fever, worsening abdominal pain, nausea/vomiting, flank pain, etc...  Outlined signs and symptoms indicating need for more acute intervention. Patient verbalized understanding. After Visit Summary given.     Emerson Monte, FNP 06/27/20 1038

## 2020-06-27 NOTE — ED Triage Notes (Signed)
Pt presents with c/o burning with urination  For past 2 weeks

## 2020-06-29 ENCOUNTER — Encounter: Payer: Self-pay | Admitting: Family Medicine

## 2020-06-29 ENCOUNTER — Telehealth (HOSPITAL_COMMUNITY): Payer: Self-pay | Admitting: Emergency Medicine

## 2020-06-29 ENCOUNTER — Ambulatory Visit (INDEPENDENT_AMBULATORY_CARE_PROVIDER_SITE_OTHER): Payer: Medicare Other | Admitting: Family Medicine

## 2020-06-29 DIAGNOSIS — E1142 Type 2 diabetes mellitus with diabetic polyneuropathy: Secondary | ICD-10-CM

## 2020-06-29 DIAGNOSIS — R531 Weakness: Secondary | ICD-10-CM

## 2020-06-29 LAB — URINE CULTURE: Culture: >=100000 — AB

## 2020-06-29 MED ORDER — FOSFOMYCIN TROMETHAMINE 3 G PO PACK: 3.0000 g | PACK | Freq: Once | ORAL | 0 refills | Status: AC

## 2020-06-29 NOTE — Progress Notes (Signed)
Virtual Visit via telephone Note  I connected with [name] on 06/29/20 at 1716 by telephone and verified that I am speaking with the correct person using two identifiers. [name] is currently located at home and patient are currently with her during visit. The provider, Fransisca Kaufmann Jisell Majer, MD is located in their office at time of visit.  Call ended at 1721  I discussed the limitations, risks, security and privacy concerns of performing an evaluation and management service by telephone and the availability of in person appointments. I also discussed with the patient that there may be a patient responsible charge related to this service. The patient expressed understanding and agreed to proceed.   History and Present Illness: Patient is having hip pains and arthritis and is weak and is getting worse.  He is not getting better.  He is taking tylenol and it helps some.  He feels like his legs are giving out and he has fallen.   No diagnosis found.  Outpatient Encounter Medications as of 06/29/2020  Medication Sig  . acetaminophen (TYLENOL) 500 MG tablet Take 1,000 mg by mouth every 6 (six) hours as needed (pain).  Marland Kitchen apixaban (ELIQUIS) 5 MG TABS tablet Take 1 tablet (5 mg total) by mouth 2 (two) times daily.  . cholecalciferol (VITAMIN D) 1000 units tablet Take 1,000 Units by mouth daily.   . ciprofloxacin (CIPRO) 500 MG tablet Take 1 tablet (500 mg total) by mouth 2 (two) times daily.  Marland Kitchen gabapentin (NEURONTIN) 300 MG capsule Take 1 capsule (300 mg total) by mouth 2 (two) times daily.  Marland Kitchen glucose blood test strip 1 strip by Does not apply route 3 (three) times daily. Uses true metrix strips (diabetic club)  . hydrocortisone cream 1 % Apply 1 application topically 2 (two) times daily.  . insulin detemir (LEVEMIR FLEXTOUCH) 100 UNIT/ML FlexPen Inject 5 Units into the skin daily.  Marland Kitchen levothyroxine (SYNTHROID) 175 MCG tablet Take 1 tablet (175 mcg total) by mouth daily before  breakfast.  . loperamide (IMODIUM A-D) 2 MG tablet Take 1 tablet (2 mg total) by mouth 4 (four) times daily as needed for diarrhea or loose stools.  . metFORMIN (GLUCOPHAGE) 1000 MG tablet TAKE 1 TABLET BY MOUTH TWICE DAILY WITH A MEAL. (Patient taking differently: Take 1,000 mg by mouth in the morning and at bedtime.)  . pantoprazole (PROTONIX) 40 MG tablet Take 1 tablet (40 mg total) by mouth daily before breakfast.  . Semaglutide, 1 MG/DOSE, (OZEMPIC, 1 MG/DOSE,) 2 MG/1.5ML SOPN Inject 1 mg into the skin once a week.  . simvastatin (ZOCOR) 80 MG tablet Take 0.5 tablets (40 mg total) by mouth at bedtime.  . sulfamethoxazole-trimethoprim (BACTRIM DS) 800-160 MG tablet Take 1 tablet by mouth 2 (two) times daily for 7 days.  . vitamin B-12 (CYANOCOBALAMIN) 500 MCG tablet take one tablet BY MOUTH DAILY   No facility-administered encounter medications on file as of 06/29/2020.    Review of Systems  Constitutional: Negative for chills and fever.  Eyes: Negative for visual disturbance.  Respiratory: Negative for shortness of breath and wheezing.   Cardiovascular: Negative for chest pain and leg swelling.  Musculoskeletal: Positive for arthralgias, gait problem and myalgias. Negative for back pain and joint swelling.  Skin: Negative for rash.  Neurological: Positive for weakness and numbness. Negative for dizziness and light-headedness.  All other systems reviewed and are negative.   Observations/Objective: Patient sounds comfortable and in no acute distress  Assessment and Plan: Problem  List Items Addressed This Visit      Endocrine   Diabetic neuropathy [facility])   Relevant Orders   Ambulatory referral to Physical Therapy     Other   Generalized weakness - Primary   Relevant Orders   Ambulatory referral to Physical Therapy      Will refer the patient to physical therapy to see if that can help with arthritis and weakness and falls, I think part of the reason he is having these falls is  because of his neuropathy and developing weakness in his legs. Follow up plan: Return if symptoms worsen or fail to improve.     I discussed the assessment and treatment plan with the patient. The patient was provided an opportunity to ask questions and all were answered. The patient agreed with the plan and demonstrated an understanding of the instructions.   The patient was advised to call back or seek an in-person evaluation if the symptoms worsen or if the condition fails to improve as anticipated.  The above assessment and management plan was discussed with the patient. The patient verbalized understanding of and has agreed to the management plan. Patient is aware to call the clinic if symptoms persist or worsen. Patient is aware when to return to the clinic for a follow-up visit. Patient educated on when it is appropriate to go to the emergency department.    I provided 5 minutes of non-face-to-face time during this encounter.    [name], MD

## 2020-07-08 ENCOUNTER — Ambulatory Visit (INDEPENDENT_AMBULATORY_CARE_PROVIDER_SITE_OTHER): Payer: Medicare Other | Admitting: Family Medicine

## 2020-07-08 DIAGNOSIS — N390 Urinary tract infection, site not specified: Secondary | ICD-10-CM

## 2020-07-08 NOTE — Progress Notes (Signed)
Called multiple times today, patient did not answer Caryl Pina, MD [facility] 07/08/2020, 5:02 PM

## 2020-07-09 ENCOUNTER — Other Ambulatory Visit: Payer: Self-pay | Admitting: Family Medicine

## 2020-07-10 ENCOUNTER — Telehealth: Payer: Self-pay

## 2020-07-10 NOTE — Telephone Encounter (Signed)
Patient called in on the community line and was asking about tingling in body. It was very hard to understand what patient was saying. As I was gathering information the patient got frustrated and hung up phone.

## 2020-07-14 ENCOUNTER — Ambulatory Visit (INDEPENDENT_AMBULATORY_CARE_PROVIDER_SITE_OTHER): Payer: Medicare Other

## 2020-07-14 DIAGNOSIS — I442 Atrioventricular block, complete: Secondary | ICD-10-CM

## 2020-07-15 ENCOUNTER — Encounter (INDEPENDENT_AMBULATORY_CARE_PROVIDER_SITE_OTHER): Payer: Self-pay | Admitting: Ophthalmology

## 2020-07-15 ENCOUNTER — Ambulatory Visit (INDEPENDENT_AMBULATORY_CARE_PROVIDER_SITE_OTHER): Payer: Medicare Other | Admitting: Ophthalmology

## 2020-07-15 ENCOUNTER — Other Ambulatory Visit: Payer: Self-pay

## 2020-07-15 DIAGNOSIS — E113491 Type 2 diabetes mellitus with severe nonproliferative diabetic retinopathy without macular edema, right eye: Secondary | ICD-10-CM | POA: Diagnosis not present

## 2020-07-15 DIAGNOSIS — E113493 Type 2 diabetes mellitus with severe nonproliferative diabetic retinopathy without macular edema, bilateral: Secondary | ICD-10-CM | POA: Diagnosis not present

## 2020-07-15 DIAGNOSIS — H43811 Vitreous degeneration, right eye: Secondary | ICD-10-CM | POA: Diagnosis not present

## 2020-07-15 DIAGNOSIS — H353211 Exudative age-related macular degeneration, right eye, with active choroidal neovascularization: Secondary | ICD-10-CM

## 2020-07-15 LAB — CUP PACEART REMOTE DEVICE CHECK
Battery Remaining Longevity: 86 mo
Battery Voltage: 2.98 V
Brady Statistic AP VP Percent: 4.06 %
Brady Statistic AP VS Percent: 0 %
Brady Statistic AS VP Percent: 95.91 %
Brady Statistic AS VS Percent: 0.03 %
Brady Statistic RA Percent Paced: 4 %
Brady Statistic RV Percent Paced: 99.97 %
Date Time Interrogation Session: 20220620203839
Implantable Lead Implant Date: 20180521
Implantable Lead Implant Date: 20180521
Implantable Lead Location: 753859
Implantable Lead Location: 753859
Implantable Lead Model: 3830
Implantable Lead Model: 5076
Implantable Pulse Generator Implant Date: 20180521
Lead Channel Impedance Value: 266 Ohm
Lead Channel Impedance Value: 304 Ohm
Lead Channel Impedance Value: 342 Ohm
Lead Channel Impedance Value: 399 Ohm
Lead Channel Pacing Threshold Amplitude: 0.625 V
Lead Channel Pacing Threshold Amplitude: 0.75 V
Lead Channel Pacing Threshold Pulse Width: 0.4 ms
Lead Channel Pacing Threshold Pulse Width: 0.4 ms
Lead Channel Sensing Intrinsic Amplitude: 0.5 mV
Lead Channel Sensing Intrinsic Amplitude: 0.5 mV
Lead Channel Sensing Intrinsic Amplitude: 19.625 mV
Lead Channel Sensing Intrinsic Amplitude: 19.625 mV
Lead Channel Setting Pacing Amplitude: 1.5 V
Lead Channel Setting Pacing Amplitude: 2.5 V
Lead Channel Setting Pacing Pulse Width: 0.4 ms
Lead Channel Setting Sensing Sensitivity: 2 mV

## 2020-07-15 MED ORDER — BEVACIZUMAB 2.5 MG/0.1ML IZ SOSY
2.5000 mg | PREFILLED_SYRINGE | INTRAVITREAL | Status: AC | PRN
  Administered 2020-07-15: 2.5 mg via INTRAVITREAL

## 2020-07-15 NOTE — Assessment & Plan Note (Signed)
Much less subretinal fluid on last visit yet increased today at same interval of 6 weeks.  Will need to repeat intravitreal Avastin OD today and follow-up again in 6 weeks

## 2020-07-15 NOTE — Assessment & Plan Note (Signed)

## 2020-07-15 NOTE — Progress Notes (Signed)
07/15/2020     CHIEF COMPLAINT Patient presents for Retina Follow Up (6 week fu OD and Avastin OD/Pt states VA OU stable since last visit. Pt denies FOL, floaters, or ocular pain OU. /A1C: it has been a long time since I seen the doctor/LBS:137)   HISTORY OF PRESENT ILLNESS: [name] is a 75 y.o. male who presents to the clinic today for:   HPI     Retina Follow Up           Diagnosis: Wet AMD   Laterality: right eye   Onset: 6 weeks ago   Severity: mild   Duration: 6 weeks   Course: stable   Comments: 6 week fu OD and Avastin OD Pt states VA OU stable since last visit. Pt denies FOL, floaters, or ocular pain OU.  A1C: it has been a long time since I seen the doctor LBS:137         Comments   No change in vision since last visit      Last edited by Hurman Horn, MD on 07/15/2020 11:03 AM.      Referring physician: Dettinger, Fransisca Kaufmann, MD [place],  [place] [id number]  HISTORICAL INFORMATION:   Selected notes from the MEDICAL RECORD NUMBER    Lab Results  Component Value Date   HGBA1C 8.1 (H) 02/17/2020     CURRENT MEDICATIONS: No current outpatient medications on file. (Ophthalmic Drugs)   No current facility-administered medications for this visit. (Ophthalmic Drugs)   Current Outpatient Medications (Other)  Medication Sig   acetaminophen (TYLENOL) 500 MG tablet Take 1,000 mg by mouth every 6 (six) hours as needed (pain).   apixaban (ELIQUIS) 5 MG TABS tablet Take 1 tablet (5 mg total) by mouth 2 (two) times daily.   cholecalciferol (VITAMIN D) 1000 units tablet Take 1,000 Units by mouth daily.    ciprofloxacin (CIPRO) 500 MG tablet Take 1 tablet (500 mg total) by mouth 2 (two) times daily.   gabapentin (NEURONTIN) 300 MG capsule Take 1 capsule (300 mg total) by mouth 2 (two) times daily.   glucose blood test strip 1 strip by Does not apply route 3 (three) times daily. Uses true metrix strips (diabetic club)   hydrocortisone cream 1 %  Apply 1 application topically 2 (two) times daily.   insulin detemir (LEVEMIR FLEXTOUCH) 100 UNIT/ML FlexPen Inject 5 Units into the skin daily.   levothyroxine (SYNTHROID) 175 MCG tablet Take 1 tablet (175 mcg total) by mouth daily before breakfast.   loperamide (IMODIUM A-D) 2 MG tablet Take 1 tablet (2 mg total) by mouth 4 (four) times daily as needed for diarrhea or loose stools.   metFORMIN (GLUCOPHAGE) 1000 MG tablet TAKE 1 TABLET BY MOUTH TWICE DAILY WITH A MEAL. (Patient taking differently: Take 1,000 mg by mouth in the morning and at bedtime.)   pantoprazole (PROTONIX) 40 MG tablet Take 1 tablet (40 mg total) by mouth daily before breakfast.   Semaglutide, 1 MG/DOSE, (OZEMPIC, 1 MG/DOSE,) 2 MG/1.5ML SOPN Inject 1 mg into the skin once a week.   simvastatin (ZOCOR) 80 MG tablet Take 0.5 tablets (40 mg total) by mouth at bedtime.   tamsulosin (FLOMAX) 0.4 MG CAPS capsule TAKE TWO CAPSULES BY MOUTH AT BEDTIME   vitamin B-12 (CYANOCOBALAMIN) 500 MCG tablet take one tablet BY MOUTH DAILY   No current facility-administered medications for this visit. (Other)      REVIEW OF SYSTEMS:    ALLERGIES Allergies  Allergen Reactions   Phenothiazines Anaphylaxis   Actos [Pioglitazone] Swelling    PAST MEDICAL HISTORY Past Medical History:  Diagnosis Date   Arthritis    Atrial fibrillation (HCC)    BPH (benign prostatic hypertrophy) 04/15/2010   Cataract    Colon polyps    DEPRESSION 01/22/2009   Heart valve replaced    HYPERCHOLESTEROLEMIA 01/22/2009   HYPERTENSION 01/22/2009   Dr. 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/22/2009   IDDM 01/22/2009   Morbid obesity ([place])    Severe nonproliferative diabetic retinopathy of both eyes ([place]) 08/27/2019   Tremors of nervous system    ?ptsd   Ulcer    Vitreous hemorrhage of left eye ([place]) 08/27/2019   Past Surgical History:  Procedure Laterality Date   AORTIC VALVE REPLACEMENT  July 2006   #20 stentless [place] porcine valve    APPENDECTOMY     bilateral cataract surg     BIOPSY  05/01/2020   Procedure: BIOPSY;  Surgeon: Eloise Harman, DO;  [facility];  Service: Endoscopy;;   COLONOSCOPY  04/24/2007   [name]: normal   COLONOSCOPY WITH ESOPHAGOGASTRODUODENOSCOPY (EGD) N/A 04/05/2012   normal rectum, somewhat elongated and redundant colon, otherwise normal. Remote history of colon polyps. EGD: normal esophagus, bile-stained gastric mucosa, diffuse patchy gastric erythema, small duodenal bulbar ulcer with associated erosions. Negative H.pylori. H.pylori serology also negative.    COLONOSCOPY WITH PROPOFOL N/A 05/01/2020   Procedure: COLONOSCOPY WITH PROPOFOL;  Surgeon: Eloise Harman, DO;  Location[facility];  Service: Endoscopy;  Laterality: N/A;   DENTAL SURGERY  05/2004   Dental extractions   ESOPHAGOGASTRODUODENOSCOPY (EGD) WITH PROPOFOL N/A 05/01/2020   Procedure: ESOPHAGOGASTRODUODENOSCOPY (EGD) WITH PROPOFOL;  Surgeon: Eloise Harman, DO;  [facility];  Service: Endoscopy;  Laterality: N/A;   EYE SURGERY Bilateral    cataract   HEEL SPUR SURGERY Bilateral    resection of heel spur   KNEE ARTHROSCOPY WITH LATERAL MENISECTOMY Right 04/04/2014   Procedure: KNEE ARTHROSCOPY WITH LATERAL MENISECTOMY;  Surgeon: Carole Civil, MD;  Location: [facility];  Service: Orthopedics;  Laterality: Right;   PACEMAKER IMPLANT N/A 06/13/2016   Procedure: Pacemaker Implant- Dual Chamber;  Surgeon: Evans Lance, MD;  Location: [facility];  Service: Cardiovascular;  Laterality: N/A;   PACEMAKER INSERTION  2018   THYROIDECTOMY  03/21/2013   DR [name]   THYROIDECTOMY N/A 03/21/2013   Procedure: THYROIDECTOMY;  Surgeon: [name], MD;  Location: [facility];  Service: General;  Laterality: N/A;   TONSILLECTOMY      FAMILY HISTORY Family History  Problem Relation Age of Onset   Heart disease Father    Congestive Heart Failure Father    Arthritis Father    Diabetes Other    Benign  prostatic hyperplasia Brother    Colon cancer Neg Hx    Colon polyps Neg Hx     SOCIAL HISTORY Social History   Tobacco Use   Smoking status: Former    Packs/day: 0.00    Years: 12.00    Pack years: 0.00    Types: Cigarettes    Quit date: 09/20/1961    Years since quitting: 58.8   Smokeless tobacco: Never  Vaping Use   Vaping Use: Never used  Substance Use Topics   Alcohol use: Not Currently   Drug use: No         OPHTHALMIC EXAM:  Base Eye Exam     Visual Acuity (ETDRS)  Right Left   Dist cc 20/30 -1 20/25 -1   Dist ph cc NI          Tonometry (Tonopen, 10:34 AM)       Right Left   Pressure 15 15         Pupils       Pupils Dark Light Shape React APD   Right PERRL 4 4 Round Minimal None   Left PERRL 4 4 Round Minimal None         Visual Fields (Counting fingers)       Left Right    Full Full         Extraocular Movement       Right Left    Full Full         Neuro/Psych     Oriented x3: Yes   Mood/Affect: Normal         Dilation     Right eye: 1.0% Mydriacyl, 2.5% Phenylephrine @ 10:34 AM           Slit Lamp and Fundus Exam     External Exam       Right Left   External Normal Normal         Slit Lamp Exam       Right Left   Lids/Lashes Normal Normal   Conjunctiva/Sclera White and quiet White and quiet   Cornea Clear Clear   Anterior Chamber Deep and quiet Deep and quiet   Iris  patent PI sup Round and reactive   Lens Centered posterior chamber intraocular lens, Open posterior capsule Centered posterior chamber intraocular lens   Anterior Vitreous Normal Normal         Fundus Exam       Right Left   Posterior Vitreous Posterior vitreous detachment Not dilated   Disc Normal    C/D Ratio 0.1    Macula Normal, no clinical subretinal fluid noted.  Peri papillary    Vessels NPDR-Severe    Periphery moderate scatter pattern prp              IMAGING AND PROCEDURES  Imaging and Procedures for  07/15/20  OCT, Retina - OU - Both Eyes       Right Eye Quality was good. Scan locations included subfoveal. Central Foveal Thickness: 299. Progression has worsened. Findings include subretinal fluid, abnormal foveal contour.   Left Eye Quality was good. Scan locations included subfoveal. Central Foveal Thickness: 291. Progression has been stable. Findings include retinal drusen , abnormal foveal contour.   Notes Increased subretinal fluid in a PERI papillary configuration likely represents peripapillary CNVM which is not evident clinical examination.  Only on OCT do we see this OD  OS no signs of active disease       Intravitreal Injection, Pharmacologic Agent - OD - Right Eye       Time Out 07/15/2020. 11:05 AM. Confirmed correct patient, procedure, site, and patient consented.   Anesthesia Topical anesthesia was used. Anesthetic medications included [place] 3.5%.   Procedure Preparation included Ofloxacin , 10% betadine to eyelids, 5% betadine to ocular surface. A 30 gauge needle was used.   Injection: 2.5 mg bevacizumab 2.5 MG/0.1ML   Route: Intravitreal   NDC: 75102-585-27, Lot: [id number]   Post-op Post injection exam found visual acuity of at least counting fingers. The patient tolerated the procedure well. There were no complications. The patient received written and verbal post procedure care education. Post injection medications were not given.  ASSESSMENT/PLAN:  Exudative age-related macular degeneration of right eye with active choroidal neovascularization (HCC) Much less subretinal fluid on last visit yet increased today at same interval of 6 weeks.  Will need to repeat intravitreal Avastin OD today and follow-up again in 6 weeks  Posterior vitreous detachment of right eye  The nature of posterior vitreous detachment was discussed with the patient as well as its physiology, its age prevalence, and its possible implication regarding retinal  breaks and detachment.  An informational brochure was offered to the patient.  All the patient's questions were answered.  The patient was asked to return if new or different flashes or floaters develops.   Patient was instructed to contact office immediately if any new changes were noticed. I explained to the patient that vitreous inside the eye is similar to jello inside a bowl. As the jello melts it can start to pull away from the bowl, similarly the vitreous throughout our lives can begin to pull away from the retina. That process is called a posterior vitreous detachment. In some cases, the vitreous can tug hard enough on the retina to form a retinal tear. I discussed with the patient the signs and symptoms of a retinal detachment.  Do not rub the eye.       ICD-10-CM   1. Exudative age-related macular degeneration of right eye with active choroidal neovascularization (HCC)  H35.3211 Intravitreal Injection, Pharmacologic Agent - OD - Right Eye    bevacizumab (AVASTIN) SOSY 2.5 mg    2. 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 Posterior vitreous detachment of right eye  H43.811       1.  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  Currently active again at 6-week interval.  2.  Repeat intravitreal Avastin OD today and examination next in 6 weeks  3.  Ophthalmic Meds Ordered this visit:  Meds ordered this encounter  Medications   bevacizumab (AVASTIN) SOSY 2.5 mg       Return in about 6 weeks (around 08/26/2020) for dilate, OD, AVASTIN OCT.  There are no Patient Instructions on file for this visit.   Explained the diagnoses, plan, and follow up with  the patient and they expressed understanding.  Patient expressed understanding of the importance of proper follow up care.   Clent Demark Anthonella Klausner M.D. Diseases & Surgery of the Retina and Vitreous [facility] [place] 07/15/20     Abbreviations: M myopia (nearsighted); A astigmatism; H hyperopia (farsighted); P presbyopia; Mrx spectacle prescription;  CTL contact lenses; OD right eye; OS left eye; OU both eyes  XT exotropia; ET esotropia; PEK punctate epithelial keratitis; PEE punctate epithelial erosions; DES dry eye syndrome; MGD meibomian gland dysfunction; ATs artificial tears; PFAT's preservative free artificial tears; [place] nuclear sclerotic cataract; PSC posterior subcapsular cataract; ERM epi-retinal membrane; PVD posterior vitreous detachment; RD retinal detachment; DM diabetes mellitus; DR diabetic retinopathy; NPDR non-proliferative diabetic retinopathy; PDR proliferative diabetic retinopathy; CSME clinically significant macular edema; DME diabetic macular edema; dbh dot blot hemorrhages; CWS cotton wool spot; POAG primary open angle glaucoma; C/D cup-to-disc ratio; HVF humphrey visual field; GVF goldmann visual field; OCT optical coherence tomography; IOP intraocular pressure; BRVO  Branch retinal vein occlusion; CRVO central retinal vein occlusion; CRAO central retinal artery occlusion; BRAO branch retinal artery occlusion; RT retinal tear; SB scleral buckle; PPV pars plana vitrectomy; VH Vitreous hemorrhage; PRP panretinal laser photocoagulation; IVK intravitreal kenalog; VMT vitreomacular traction; MH Macular hole;  NVD neovascularization of the disc; NVE neovascularization elsewhere; AREDS age related eye disease study; ARMD age related macular degeneration; POAG primary open angle glaucoma; EBMD epithelial/anterior basement membrane dystrophy; ACIOL anterior chamber intraocular lens; IOL intraocular lens; PCIOL posterior chamber intraocular lens; Phaco/IOL phacoemulsification with  intraocular lens placement; [place] photorefractive keratectomy; LASIK laser assisted in situ keratomileusis; HTN hypertension; DM diabetes mellitus; COPD chronic obstructive pulmonary disease

## 2020-07-16 ENCOUNTER — Telehealth (INDEPENDENT_AMBULATORY_CARE_PROVIDER_SITE_OTHER): Payer: No Typology Code available for payment source | Admitting: Ophthalmology

## 2020-07-16 MED ORDER — TOBRAMYCIN-DEXAMETHASONE 0.3-0.1 % OP OINT: TOPICAL_OINTMENT | Freq: Three times a day (TID) | OPHTHALMIC | 0 refills | Status: AC

## 2020-07-20 ENCOUNTER — Telehealth: Payer: Self-pay | Admitting: Family Medicine

## 2020-07-20 DIAGNOSIS — Z1211 Encounter for screening for malignant neoplasm of colon: Secondary | ICD-10-CM

## 2020-07-20 NOTE — Telephone Encounter (Signed)
Placed referral for [facility], patient already has an appointment

## 2020-07-21 NOTE — Telephone Encounter (Signed)
PRESCRIPTION CALLED IN

## 2020-07-22 ENCOUNTER — Telehealth: Payer: Self-pay | Admitting: Family Medicine

## 2020-07-22 NOTE — Telephone Encounter (Signed)
Appt made with Dettinger for tomorrow

## 2020-07-22 NOTE — Telephone Encounter (Signed)
Pt called in wanting an appt. He was offered a 9:55 with his PCP, Dr Building control surveyor. When asked what he wanted to be seen for he said "nothing serious" and then said "just forget it" and hung up.

## 2020-07-23 ENCOUNTER — Other Ambulatory Visit: Payer: Self-pay

## 2020-07-23 ENCOUNTER — Encounter: Payer: Self-pay | Admitting: Family Medicine

## 2020-07-23 ENCOUNTER — Ambulatory Visit (INDEPENDENT_AMBULATORY_CARE_PROVIDER_SITE_OTHER): Payer: Medicare Other | Admitting: Family Medicine

## 2020-07-23 VITALS — BP 118/64 | HR 82 | Ht 73.0 in | Wt 236.0 lb

## 2020-07-23 DIAGNOSIS — M1612 Unilateral primary osteoarthritis, left hip: Secondary | ICD-10-CM | POA: Diagnosis not present

## 2020-07-23 DIAGNOSIS — M1712 Unilateral primary osteoarthritis, left knee: Secondary | ICD-10-CM

## 2020-07-23 MED ORDER — PREDNISONE 20 MG PO TABS: ORAL_TABLET | ORAL | 0 refills | Status: DC

## 2020-07-23 NOTE — Progress Notes (Signed)
BP 118/64   Pulse 82   Ht 6\' 1"  (1.854 m)   Wt 236 lb (107 kg)   SpO2 97%   BMI 31.14 kg/m    Subjective:   Patient ID: [name], male    DOB: [birth year], 75 y.o.   MRN: [id number]  HPI: [name] is a 75 y.o. male presenting on 07/23/2020 for Leg Pain (left)   HPI Patient comes in complaining of left leg pain and hip pain.  He says is been hurting him off and on for the past couple weeks.  He thinks it made him a bit longer than that.  He says it hurts to walk on it and it hurts to bend and he just does not feel well with it.  He denies any urinary or bowel issues.  He says it hurts from the left hip sometimes down the left knee.  He denies any fevers or chills or redness or warmth.  He denies any weakness or numbness or giving way.  He has used Tylenol but cannot use a lot of other medicines because they have his anticoagulant.  The Tylenol did not seem to help.  Relevant past medical, surgical, family and social history reviewed and updated as indicated. Interim medical history since our last visit reviewed. Allergies and medications reviewed and updated.  Review of Systems  Constitutional:  Negative for chills and fever.  Respiratory:  Negative for shortness of breath and wheezing.   Cardiovascular:  Negative for chest pain and leg swelling.  Musculoskeletal:  Positive for arthralgias, gait problem and myalgias. Negative for back pain.  Skin:  Negative for color change, rash and wound.  All other systems reviewed and are negative.  Per HPI unless specifically indicated above   Allergies as of 07/23/2020       Reactions   Phenothiazines Anaphylaxis   Actos [pioglitazone] Swelling        Medication List        Accurate as of July 23, 2020 11:10 AM. If you have any questions, ask your nurse or doctor.          acetaminophen 500 MG tablet Commonly known as: TYLENOL Take 1,000 mg by mouth every 6 (six) hours as needed (pain).   apixaban 5 MG Tabs  tablet Commonly known as: Eliquis Take 1 tablet (5 mg total) by mouth 2 (two) times daily.   cholecalciferol 1000 units tablet Commonly known as: VITAMIN D Take 1,000 Units by mouth daily.   ciprofloxacin 500 MG tablet Commonly known as: Cipro Take 1 tablet (500 mg total) by mouth 2 (two) times daily.   gabapentin 300 MG capsule Commonly known as: NEURONTIN Take 1 capsule (300 mg total) by mouth 2 (two) times daily.   glucose blood test strip 1 strip by Does not apply route 3 (three) times daily. Uses true metrix strips (diabetic club)   hydrocortisone cream 1 % Apply 1 application topically 2 (two) times daily.   Levemir FlexTouch 100 UNIT/ML FlexPen Generic drug: insulin detemir Inject 5 Units into the skin daily.   levothyroxine 175 MCG tablet Commonly known as: SYNTHROID Take 1 tablet (175 mcg total) by mouth daily before breakfast.   loperamide 2 MG tablet Commonly known as: Imodium A-D Take 1 tablet (2 mg total) by mouth 4 (four) times daily as needed for diarrhea or loose stools.   metFORMIN 1000 MG tablet Commonly known as: GLUCOPHAGE TAKE 1 TABLET BY MOUTH TWICE DAILY WITH A MEAL. What changed:  when to take this   Ozempic (1 MG/DOSE) 2 MG/1.5ML Sopn Generic drug: Semaglutide (1 MG/DOSE) Inject 1 mg into the skin once a week.   pantoprazole 40 MG tablet Commonly known as: PROTONIX Take 1 tablet (40 mg total) by mouth daily before breakfast.   predniSONE 20 MG tablet Commonly known as: DELTASONE 2 po at same time daily for 5 days Started by: [name], MD   simvastatin 80 MG tablet Commonly known as: ZOCOR Take 0.5 tablets (40 mg total) by mouth at bedtime.   tamsulosin 0.4 MG Caps capsule Commonly known as: FLOMAX TAKE TWO CAPSULES BY MOUTH AT BEDTIME   vitamin B-12 500 MCG tablet Commonly known as: CYANOCOBALAMIN take one tablet BY MOUTH DAILY         Objective:   BP 118/64   Pulse 82   Ht 6\' 1"  (1.854 m)   Wt 236 lb (107 kg)    SpO2 97%   BMI 31.14 kg/m   Wt Readings from Last 3 Encounters:  07/23/20 236 lb (107 kg)  06/08/20 234 lb (106.1 kg)  05/14/20 232 lb 4 oz (105.3 kg)    Physical Exam Vitals and nursing note reviewed.  Constitutional:      General: He is not in acute distress.    Appearance: He is well-developed. He is not diaphoretic.  Musculoskeletal:        General: Tenderness (Tenderness in left knee especially with range of motion, no tenderness in left hip on palpation or with range of motion.) present.  Skin:    General: Skin is warm and dry.     Findings: No rash.  Neurological:     Mental Status: He is alert and oriented to person, place, and time.     Coordination: Coordination normal.  Psychiatric:        Behavior: Behavior normal.      Assessment & Plan:   Problem List Items Addressed This Visit   None Visit Diagnoses     Arthritis of left hip    -  Primary   Relevant Medications   predniSONE (DELTASONE) 20 MG tablet   Arthritis of left knee       Relevant Medications   predniSONE (DELTASONE) 20 MG tablet     Will do short course of prednisone, recommended that if his sugars get up to increase his insulin by 5 units up to 38 units daily while he is on the steroids and then go back afterwards.  Based on exam likely arthritis in the hip and the knee bothering him and affecting his walking.  Also recommend the possibility of future physical therapy but he wanted to try steroids first.  Follow up plan: Return if symptoms worsen or fail to improve.  Counseling provided for all of the vaccine components No orders of the defined types were placed in this encounter.   Caryl Pina, MD [facility] 07/23/2020, 11:10 AM

## 2020-07-28 NOTE — Progress Notes (Deleted)
Cardiology Office Note   Date:  07/28/2020   ID:  UDAY JANTZ, DOB 10-Feb-1945, MRN [id number]  PCP:  Dettinger, Fransisca Kaufmann, MD  Cardiologist:   None   No chief complaint on file.     History of Present Illness: [name] is a 75 y.o. male who for followup of aortic valve replacement, follow up of CAD, atrial fib and pacemaker placement for symptomatic bradycardia.  He was in the hospital in April with weakness.  He had orthostatic hypotension.  Lasix and lisinopril were discontinued.   He had anemia and had EGD and colonoscopy.  He was cleared to continue eliquis.   ***      ***At the last visit he had increased SOB.   I sent him for PFTs.  He had moderate restrictive lung disease but he missed follow up to discuss these results and we had trouble rescheduling.  He had a fall with questionable syncope this week and was seen in the ED.  I reviewed these records for this visit.      Has had a complicated recent history.  He has syncope and was hospitalized at [facility] in August and had a work-up.  Is able to review some of these records.  In particular he had a normal echocardiogram.  There was no clear etiology identified for his syncope.  He had some trauma he has some cervical nondisplaced lamina fractures.  He had no acute intracranial findings.  He has a pacemaker and is up-to-date with follow-up and he has had normal function.  He had another event a few days ago.  He said he was getting out of church and going to his car.  He got in his car and he had a loss of consciousness very briefly and went down and injured his ankle.  He again went to the [facility] ER.  There was no fracture.  He has been referred to neurosurgery because he had progressive lower extremity weakness and apparently to evaluate cervical fractures as well.  He says he has days where his legs are weak like today when he had to have assistance walking in and other days where he can do better.  He  can drive his car and walk into the grocery store but then wants a cart.  He does not describe his heart racing or skipping.  Is not having any chest pressure, neck or arm discomfort.  He is not having any new shortness of breath and actually reports this complaint to be relatively stable.  He is not having any PND or orthopnea.  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  He has put this off however.   Past Medical History:  Diagnosis Date   Arthritis    Atrial fibrillation (HCC)    BPH (benign prostatic hypertrophy) 04/15/2010   Cataract    Colon polyps    DEPRESSION 01/22/2009   Heart valve replaced    HYPERCHOLESTEROLEMIA 01/22/2009   HYPERTENSION 01/22/2009   Dr. 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/22/2009   IDDM 01/22/2009   Morbid obesity ([place])    Severe nonproliferative diabetic retinopathy of both eyes ([place]) 08/27/2019   Tremors of nervous system    ?ptsd   Ulcer    Vitreous hemorrhage of left eye ([place]) 08/27/2019    Past Surgical History:  Procedure Laterality Date   AORTIC VALVE REPLACEMENT  July 2006   #20 stentless [place] porcine valve  APPENDECTOMY     bilateral cataract surg     BIOPSY  05/01/2020   Procedure: BIOPSY;  Surgeon: Eloise Harman, DO;  [facility];  Service: Endoscopy;;   COLONOSCOPY  04/24/2007   [name]: normal   COLONOSCOPY WITH ESOPHAGOGASTRODUODENOSCOPY (EGD) N/A 04/05/2012   normal rectum, somewhat elongated and redundant colon, otherwise normal. Remote history of colon polyps. EGD: normal esophagus, bile-stained gastric mucosa, diffuse patchy gastric erythema, small duodenal bulbar ulcer with associated erosions. Negative H.pylori. H.pylori serology also negative.    COLONOSCOPY WITH PROPOFOL N/A 05/01/2020   Procedure: COLONOSCOPY WITH PROPOFOL;  Surgeon: Eloise Harman, DO;  [facility];  Service: Endoscopy;  Laterality: N/A;   DENTAL SURGERY  05/2004   Dental extractions    ESOPHAGOGASTRODUODENOSCOPY (EGD) WITH PROPOFOL N/A 05/01/2020   Procedure: ESOPHAGOGASTRODUODENOSCOPY (EGD) WITH PROPOFOL;  Surgeon: Eloise Harman, DO;  [facility];  Service: Endoscopy;  Laterality: N/A;   EYE SURGERY Bilateral    cataract   HEEL SPUR SURGERY Bilateral    resection of heel spur   KNEE ARTHROSCOPY WITH LATERAL MENISECTOMY Right 04/04/2014   Procedure: KNEE ARTHROSCOPY WITH LATERAL MENISECTOMY;  Surgeon: Carole Civil, MD;  Location: [facility];  Service: Orthopedics;  Laterality: Right;   PACEMAKER IMPLANT N/A 06/13/2016   Procedure: Pacemaker Implant- Dual Chamber;  Surgeon: Evans Lance, MD;  Location: [facility];  Service: Cardiovascular;  Laterality: N/A;   PACEMAKER INSERTION  2018   THYROIDECTOMY  03/21/2013   DR [name]   THYROIDECTOMY N/A 03/21/2013   Procedure: THYROIDECTOMY;  Surgeon: [name], MD;  Location: [facility];  Service: General;  Laterality: N/A;   TONSILLECTOMY       Current Outpatient Medications  Medication Sig Dispense Refill   acetaminophen (TYLENOL) 500 MG tablet Take 1,000 mg by mouth every 6 (six) hours as needed (pain).     apixaban (ELIQUIS) 5 MG TABS tablet Take 1 tablet (5 mg total) by mouth 2 (two) times daily. 60 tablet 3   cholecalciferol (VITAMIN D) 1000 units tablet Take 1,000 Units by mouth daily.      ciprofloxacin (CIPRO) 500 MG tablet Take 1 tablet (500 mg total) by mouth 2 (two) times daily. 20 tablet 0   gabapentin (NEURONTIN) 300 MG capsule Take 1 capsule (300 mg total) by mouth 2 (two) times daily. 180 capsule 3   glucose blood test strip 1 strip by Does not apply route 3 (three) times daily. Uses true metrix strips (diabetic club)  12   hydrocortisone cream 1 % Apply 1 application topically 2 (two) times daily. 30 g 0   insulin detemir (LEVEMIR FLEXTOUCH) 100 UNIT/ML FlexPen Inject 5 Units into the skin daily.     levothyroxine (SYNTHROID) 175 MCG tablet Take 1 tablet (175 mcg total) by mouth daily  before breakfast. 90 tablet 3   loperamide (IMODIUM A-D) 2 MG tablet Take 1 tablet (2 mg total) by mouth 4 (four) times daily as needed for diarrhea or loose stools. 30 tablet 0   metFORMIN (GLUCOPHAGE) 1000 MG tablet TAKE 1 TABLET BY MOUTH TWICE DAILY WITH A MEAL. (Patient taking differently: Take 1,000 mg by mouth in the morning and at bedtime.) 180 tablet 0   pantoprazole (PROTONIX) 40 MG tablet Take 1 tablet (40 mg total) by mouth daily before breakfast. 30 tablet 0   predniSONE (DELTASONE) 20 MG tablet 2 po at same time daily for 5 days 10 tablet 0   Semaglutide,  1 MG/DOSE, (OZEMPIC, 1 MG/DOSE,) 2 MG/1.5ML SOPN Inject 1 mg into the skin once a week. 4.5 mL 3   simvastatin (ZOCOR) 80 MG tablet Take 0.5 tablets (40 mg total) by mouth at bedtime. 15 tablet 0   tamsulosin (FLOMAX) 0.4 MG CAPS capsule TAKE TWO CAPSULES BY MOUTH AT BEDTIME 180 capsule 0   vitamin B-12 (CYANOCOBALAMIN) 500 MCG tablet take one tablet BY MOUTH DAILY     No current facility-administered medications for this visit.    Allergies:   Phenothiazines and Actos [pioglitazone]    ROS:  Please see the history of present illness.   Otherwise, review of systems are positive for ***.   All other systems are reviewed and negative.    PHYSICAL EXAM: VS:  There were no vitals taken for this visit. , BMI There is no height or weight on file to calculate BMI. GENERAL:  Well appearing NECK:  No jugular venous distention, waveform within normal limits, carotid upstroke brisk and symmetric, no bruits, no thyromegaly LUNGS:  Clear to auscultation bilaterally CHEST:  *** HEART:  PMI not displaced or sustained,S1 and S2 within normal limits, no S3, no clicks, no rubs, *** murmurs, irregular  ABD:  Flat, positive bowel sounds normal in frequency in pitch, no bruits, no rebound, no guarding, no midline pulsatile mass, no hepatomegaly, no splenomegaly EXT:  2 plus pulses throughout, no edema, no cyanosis no clubbing    ***GEN:  No  distress NECK:  No jugular venous distention at 90 degrees, waveform within normal limits, carotid upstroke brisk and symmetric, no bruits, no thyromegaly LYMPHATICS:  No cervical adenopathy LUNGS:  Clear to auscultation bilaterally BACK:  No CVA tenderness CHEST:  Unremarkable HEART:  S1 and S2 within normal limits, no S3, no S4, no clicks, no rubs, brief apical systolic murmur heard at the apex, no diastolic murmurs ABD:  Positive bowel sounds normal in frequency in pitch, no bruits, no rebound, no guarding, unable to assess midline mass or bruit with the patient seated. EXT:  2 plus pulses throughout, trace edema, no cyanosis no clubbing SKIN:  No rashes no nodules NEURO:  Cranial nerves II through XII grossly intact, motor grossly intact throughout PSYCH:  Cognitively intact, oriented to person place and time  EKG:  EKG is *** ordered today. The ekg ordered today demonstrates sinus rhythm with ventricular pacing 100% capture, rate ***    Recent Labs: 03/03/2020: TSH 1.557 04/28/2020: ALT 10 04/29/2020: Magnesium 1.8 05/14/2020: BUN 21; Creatinine, Ser 1.59; Hemoglobin 9.8; Platelets 163; Potassium 5.6; Sodium 135    Lipid Panel    Component Value Date/Time   CHOL 135 03/04/2020 0509   CHOL 122 02/17/2020 0915   CHOL 136 08/02/2012 1117   TRIG 75 03/04/2020 0509   TRIG 98 12/09/2013 1054   TRIG 108 08/02/2012 1117   HDL 46 03/04/2020 0509   HDL 59 02/17/2020 0915   HDL 49 12/09/2013 1054   HDL 36 (L) 08/02/2012 1117   CHOLHDL 2.9 03/04/2020 0509   VLDL 15 03/04/2020 0509   LDLCALC 74 03/04/2020 0509   LDLCALC 53 02/17/2020 0915   LDLCALC 95 08/30/2013 0812   LDLCALC 78 08/02/2012 1117      Wt Readings from Last 3 Encounters:  07/23/20 236 lb (107 kg)  06/08/20 234 lb (106.1 kg)  05/14/20 232 lb 4 oz (105.3 kg)     Other studies Reviewed: Additional studies/ records that were reviewed today include: Reivew of hospital records.  ***  Review of  the above records  demonstrates:  Please see elsewhere in the note.     ASSESSMENT AND PLAN:  CAD: ***  At this point I do not think there is an anginal equivalent.  He is not complaining of any chest pain.  He will continue with risk reduction.   CHRONIC SYSTOLIC AND DIASTOLIC HF:   ***  He seems to be euvolemic and has had no evidence of volume overload.  No change in therapy. \   AORTIC VALVE REPLACEMENT, HX OF: ***  I reviewed the results from the echocardiogram earlier this summer as above and he has had normal valve function.   HYPERTENSION:  His blood pressure is ***  at target.  We are unable to assess orthostatics as he could not get up on the exam table.   PPM: He is up to date with follow up.  *** I reviewed the September readings and he is up-to-date with follow-up with predominantly atrial sensed ventricular paced rhythm.    ATRIAL FIB - *** He tolerates anticoagulation.  He has had PAF.   He tolerates anticoagulation.  However, given his leg weakness and the possibility of neuropathy related to amiodarone and also some shortness of breath I feel compelled to stop the amiodarone and see how he does off of this.   DM: A1C was *** 7.3.  No change in therapy.   DYSPNEA: *** He had some mild restriction on his pulmonary function test.  I am going to stop the amiodarone and if he has any continued shortness of breath (which is not a predominant complaint today) I will likely need to send him to pulmonary.   Current medicines are reviewed at length with the patient today.  The patient does not have concerns regarding medicines.  The following changes have been made: ***  Labs/ tests ordered today include: ***  No orders of the defined types were placed in this encounter.    Disposition:   FU with me in *** months.   Signed, Minus Breeding, MD  07/28/2020 8:29 PM    [facility]

## 2020-07-29 ENCOUNTER — Ambulatory Visit: Payer: No Typology Code available for payment source | Admitting: Cardiology

## 2020-07-30 ENCOUNTER — Ambulatory Visit: Payer: Medicare Other | Admitting: Family

## 2020-07-30 ENCOUNTER — Encounter: Payer: Self-pay | Admitting: Family Medicine

## 2020-07-30 DIAGNOSIS — G319 Degenerative disease of nervous system, unspecified: Secondary | ICD-10-CM | POA: Diagnosis not present

## 2020-07-30 DIAGNOSIS — Z87891 Personal history of nicotine dependence: Secondary | ICD-10-CM | POA: Diagnosis not present

## 2020-07-30 DIAGNOSIS — M79604 Pain in right leg: Secondary | ICD-10-CM | POA: Diagnosis not present

## 2020-07-30 DIAGNOSIS — M79605 Pain in left leg: Secondary | ICD-10-CM | POA: Diagnosis not present

## 2020-07-30 DIAGNOSIS — I10 Essential (primary) hypertension: Secondary | ICD-10-CM | POA: Diagnosis not present

## 2020-07-30 DIAGNOSIS — Z950 Presence of cardiac pacemaker: Secondary | ICD-10-CM | POA: Diagnosis not present

## 2020-07-30 DIAGNOSIS — Z20822 Contact with and (suspected) exposure to covid-19: Secondary | ICD-10-CM | POA: Diagnosis not present

## 2020-07-30 DIAGNOSIS — E1159 Type 2 diabetes mellitus with other circulatory complications: Secondary | ICD-10-CM | POA: Diagnosis not present

## 2020-07-30 DIAGNOSIS — R531 Weakness: Secondary | ICD-10-CM | POA: Diagnosis not present

## 2020-07-30 DIAGNOSIS — R262 Difficulty in walking, not elsewhere classified: Secondary | ICD-10-CM | POA: Diagnosis not present

## 2020-07-31 ENCOUNTER — Telehealth: Payer: Self-pay | Admitting: Family Medicine

## 2020-07-31 NOTE — Telephone Encounter (Signed)
No answer, no voicemail.

## 2020-08-01 DIAGNOSIS — R296 Repeated falls: Secondary | ICD-10-CM | POA: Diagnosis not present

## 2020-08-01 DIAGNOSIS — E7849 Other hyperlipidemia: Secondary | ICD-10-CM | POA: Diagnosis not present

## 2020-08-01 DIAGNOSIS — I509 Heart failure, unspecified: Secondary | ICD-10-CM | POA: Diagnosis not present

## 2020-08-01 DIAGNOSIS — N183 Chronic kidney disease, stage 3 unspecified: Secondary | ICD-10-CM | POA: Diagnosis not present

## 2020-08-01 DIAGNOSIS — Z743 Need for continuous supervision: Secondary | ICD-10-CM | POA: Diagnosis not present

## 2020-08-01 DIAGNOSIS — J449 Chronic obstructive pulmonary disease, unspecified: Secondary | ICD-10-CM | POA: Diagnosis not present

## 2020-08-01 DIAGNOSIS — I359 Nonrheumatic aortic valve disorder, unspecified: Secondary | ICD-10-CM | POA: Diagnosis not present

## 2020-08-01 DIAGNOSIS — Z7984 Long term (current) use of oral hypoglycemic drugs: Secondary | ICD-10-CM | POA: Diagnosis not present

## 2020-08-01 DIAGNOSIS — I4891 Unspecified atrial fibrillation: Secondary | ICD-10-CM | POA: Diagnosis not present

## 2020-08-01 DIAGNOSIS — R2689 Other abnormalities of gait and mobility: Secondary | ICD-10-CM | POA: Diagnosis not present

## 2020-08-01 DIAGNOSIS — I499 Cardiac arrhythmia, unspecified: Secondary | ICD-10-CM | POA: Diagnosis not present

## 2020-08-01 DIAGNOSIS — Z20822 Contact with and (suspected) exposure to covid-19: Secondary | ICD-10-CM | POA: Diagnosis not present

## 2020-08-01 DIAGNOSIS — E161 Other hypoglycemia: Secondary | ICD-10-CM | POA: Diagnosis not present

## 2020-08-01 DIAGNOSIS — R6889 Other general symptoms and signs: Secondary | ICD-10-CM | POA: Diagnosis not present

## 2020-08-01 DIAGNOSIS — Z87891 Personal history of nicotine dependence: Secondary | ICD-10-CM | POA: Diagnosis not present

## 2020-08-01 DIAGNOSIS — E1140 Type 2 diabetes mellitus with diabetic neuropathy, unspecified: Secondary | ICD-10-CM | POA: Diagnosis not present

## 2020-08-01 DIAGNOSIS — I130 Hypertensive heart and chronic kidney disease with heart failure and stage 1 through stage 4 chronic kidney disease, or unspecified chronic kidney disease: Secondary | ICD-10-CM | POA: Diagnosis not present

## 2020-08-01 DIAGNOSIS — Z792 Long term (current) use of antibiotics: Secondary | ICD-10-CM | POA: Diagnosis not present

## 2020-08-01 DIAGNOSIS — R404 Transient alteration of awareness: Secondary | ICD-10-CM | POA: Diagnosis not present

## 2020-08-01 DIAGNOSIS — N1831 Chronic kidney disease, stage 3a: Secondary | ICD-10-CM | POA: Diagnosis not present

## 2020-08-01 DIAGNOSIS — E11649 Type 2 diabetes mellitus with hypoglycemia without coma: Secondary | ICD-10-CM | POA: Diagnosis not present

## 2020-08-01 DIAGNOSIS — I5032 Chronic diastolic (congestive) heart failure: Secondary | ICD-10-CM | POA: Diagnosis not present

## 2020-08-01 DIAGNOSIS — Z1612 Extended spectrum beta lactamase (ESBL) resistance: Secondary | ICD-10-CM | POA: Diagnosis not present

## 2020-08-01 DIAGNOSIS — I10 Essential (primary) hypertension: Secondary | ICD-10-CM | POA: Diagnosis not present

## 2020-08-01 DIAGNOSIS — Z794 Long term (current) use of insulin: Secondary | ICD-10-CM | POA: Diagnosis not present

## 2020-08-01 DIAGNOSIS — N182 Chronic kidney disease, stage 2 (mild): Secondary | ICD-10-CM | POA: Diagnosis not present

## 2020-08-01 DIAGNOSIS — N390 Urinary tract infection, site not specified: Secondary | ICD-10-CM | POA: Diagnosis not present

## 2020-08-01 DIAGNOSIS — R531 Weakness: Secondary | ICD-10-CM | POA: Diagnosis not present

## 2020-08-01 DIAGNOSIS — M6281 Muscle weakness (generalized): Secondary | ICD-10-CM | POA: Diagnosis not present

## 2020-08-01 DIAGNOSIS — E1122 Type 2 diabetes mellitus with diabetic chronic kidney disease: Secondary | ICD-10-CM | POA: Diagnosis not present

## 2020-08-01 DIAGNOSIS — Z79899 Other long term (current) drug therapy: Secondary | ICD-10-CM | POA: Diagnosis not present

## 2020-08-03 NOTE — Progress Notes (Signed)
Remote pacemaker transmission.   

## 2020-08-04 NOTE — Telephone Encounter (Signed)
No answer, no voicemail.

## 2020-08-06 DIAGNOSIS — I359 Nonrheumatic aortic valve disorder, unspecified: Secondary | ICD-10-CM | POA: Diagnosis not present

## 2020-08-06 DIAGNOSIS — E11649 Type 2 diabetes mellitus with hypoglycemia without coma: Secondary | ICD-10-CM | POA: Diagnosis not present

## 2020-08-06 DIAGNOSIS — R531 Weakness: Secondary | ICD-10-CM | POA: Diagnosis not present

## 2020-08-06 DIAGNOSIS — I5032 Chronic diastolic (congestive) heart failure: Secondary | ICD-10-CM | POA: Diagnosis not present

## 2020-08-06 DIAGNOSIS — E7849 Other hyperlipidemia: Secondary | ICD-10-CM | POA: Diagnosis not present

## 2020-08-06 DIAGNOSIS — E1122 Type 2 diabetes mellitus with diabetic chronic kidney disease: Secondary | ICD-10-CM | POA: Diagnosis not present

## 2020-08-06 DIAGNOSIS — I10 Essential (primary) hypertension: Secondary | ICD-10-CM | POA: Diagnosis not present

## 2020-08-06 DIAGNOSIS — R296 Repeated falls: Secondary | ICD-10-CM | POA: Diagnosis not present

## 2020-08-06 DIAGNOSIS — Z1612 Extended spectrum beta lactamase (ESBL) resistance: Secondary | ICD-10-CM | POA: Diagnosis not present

## 2020-08-06 DIAGNOSIS — R2689 Other abnormalities of gait and mobility: Secondary | ICD-10-CM | POA: Diagnosis not present

## 2020-08-06 DIAGNOSIS — M6281 Muscle weakness (generalized): Secondary | ICD-10-CM | POA: Diagnosis not present

## 2020-08-06 DIAGNOSIS — I509 Heart failure, unspecified: Secondary | ICD-10-CM | POA: Diagnosis not present

## 2020-08-06 DIAGNOSIS — I130 Hypertensive heart and chronic kidney disease with heart failure and stage 1 through stage 4 chronic kidney disease, or unspecified chronic kidney disease: Secondary | ICD-10-CM | POA: Diagnosis not present

## 2020-08-06 DIAGNOSIS — N390 Urinary tract infection, site not specified: Secondary | ICD-10-CM | POA: Diagnosis not present

## 2020-08-06 DIAGNOSIS — Z7984 Long term (current) use of oral hypoglycemic drugs: Secondary | ICD-10-CM | POA: Diagnosis not present

## 2020-08-06 DIAGNOSIS — I4891 Unspecified atrial fibrillation: Secondary | ICD-10-CM | POA: Diagnosis not present

## 2020-08-06 DIAGNOSIS — J449 Chronic obstructive pulmonary disease, unspecified: Secondary | ICD-10-CM | POA: Diagnosis not present

## 2020-08-06 DIAGNOSIS — N183 Chronic kidney disease, stage 3 unspecified: Secondary | ICD-10-CM | POA: Diagnosis not present

## 2020-08-06 DIAGNOSIS — E161 Other hypoglycemia: Secondary | ICD-10-CM | POA: Diagnosis not present

## 2020-08-06 DIAGNOSIS — N1831 Chronic kidney disease, stage 3a: Secondary | ICD-10-CM | POA: Diagnosis not present

## 2020-08-06 DIAGNOSIS — E1140 Type 2 diabetes mellitus with diabetic neuropathy, unspecified: Secondary | ICD-10-CM | POA: Diagnosis not present

## 2020-08-07 ENCOUNTER — Telehealth: Payer: Self-pay

## 2020-08-07 DIAGNOSIS — Z950 Presence of cardiac pacemaker: Secondary | ICD-10-CM | POA: Insufficient documentation

## 2020-08-07 DIAGNOSIS — S12600A Unspecified displaced fracture of seventh cervical vertebra, initial encounter for closed fracture: Secondary | ICD-10-CM | POA: Insufficient documentation

## 2020-08-07 NOTE — Telephone Encounter (Signed)
Transition Care Management Unsuccessful Follow-up Telephone Call  Date of discharge and from where:  [facility] 08/06/20  Diagnosis: AMS, hypoglycemia   Attempts:  1st Attempt  Reason for unsuccessful TCM follow-up call:  Unable to leave message    Transition Care Management Unsuccessful Follow-up Telephone Call  Date of discharge and from where:  [facility] 08/06/20  Diagnosis: AMS, hypoglycemia   Attempts:  2nd Attempt  Reason for unsuccessful TCM follow-up call:  Unable to leave message

## 2020-08-10 NOTE — Telephone Encounter (Signed)
Transition Care Management Unsuccessful Follow-up Telephone Call  Date of discharge and from where:  [facility] 08/06/20  Diagnosis: AMS, hypoglycemia   Attempts:  3rd Attempt  Reason for unsuccessful TCM follow-up call:  No answer/busy

## 2020-08-24 DIAGNOSIS — R410 Disorientation, unspecified: Secondary | ICD-10-CM | POA: Diagnosis not present

## 2020-08-24 DIAGNOSIS — R739 Hyperglycemia, unspecified: Secondary | ICD-10-CM | POA: Diagnosis not present

## 2020-08-24 DIAGNOSIS — S0990XA Unspecified injury of head, initial encounter: Secondary | ICD-10-CM | POA: Diagnosis not present

## 2020-08-24 DIAGNOSIS — Z043 Encounter for examination and observation following other accident: Secondary | ICD-10-CM | POA: Diagnosis not present

## 2020-08-24 DIAGNOSIS — Z9181 History of falling: Secondary | ICD-10-CM | POA: Insufficient documentation

## 2020-08-24 DIAGNOSIS — Z743 Need for continuous supervision: Secondary | ICD-10-CM | POA: Diagnosis not present

## 2020-08-24 DIAGNOSIS — R531 Weakness: Secondary | ICD-10-CM | POA: Diagnosis not present

## 2020-08-24 DIAGNOSIS — R0689 Other abnormalities of breathing: Secondary | ICD-10-CM | POA: Diagnosis not present

## 2020-08-24 DIAGNOSIS — I259 Chronic ischemic heart disease, unspecified: Secondary | ICD-10-CM | POA: Insufficient documentation

## 2020-08-24 DIAGNOSIS — R42 Dizziness and giddiness: Secondary | ICD-10-CM | POA: Diagnosis not present

## 2020-08-24 DIAGNOSIS — R079 Chest pain, unspecified: Secondary | ICD-10-CM | POA: Diagnosis not present

## 2020-08-24 DIAGNOSIS — I5032 Chronic diastolic (congestive) heart failure: Secondary | ICD-10-CM | POA: Insufficient documentation

## 2020-08-24 DIAGNOSIS — Z950 Presence of cardiac pacemaker: Secondary | ICD-10-CM | POA: Insufficient documentation

## 2020-08-24 DIAGNOSIS — R600 Localized edema: Secondary | ICD-10-CM | POA: Insufficient documentation

## 2020-08-24 DIAGNOSIS — N390 Urinary tract infection, site not specified: Secondary | ICD-10-CM | POA: Diagnosis not present

## 2020-08-24 DIAGNOSIS — W19XXXA Unspecified fall, initial encounter: Secondary | ICD-10-CM | POA: Diagnosis not present

## 2020-08-25 DIAGNOSIS — U071 COVID-19: Secondary | ICD-10-CM | POA: Insufficient documentation

## 2020-08-25 DIAGNOSIS — R531 Weakness: Secondary | ICD-10-CM | POA: Diagnosis not present

## 2020-08-25 DIAGNOSIS — I4891 Unspecified atrial fibrillation: Secondary | ICD-10-CM | POA: Diagnosis not present

## 2020-08-25 DIAGNOSIS — E119 Type 2 diabetes mellitus without complications: Secondary | ICD-10-CM | POA: Diagnosis not present

## 2020-08-25 DIAGNOSIS — I5032 Chronic diastolic (congestive) heart failure: Secondary | ICD-10-CM | POA: Diagnosis not present

## 2020-08-26 ENCOUNTER — Encounter (INDEPENDENT_AMBULATORY_CARE_PROVIDER_SITE_OTHER): Payer: Medicare Other | Admitting: Ophthalmology

## 2020-08-26 DIAGNOSIS — I5032 Chronic diastolic (congestive) heart failure: Secondary | ICD-10-CM | POA: Diagnosis not present

## 2020-08-26 DIAGNOSIS — U071 COVID-19: Secondary | ICD-10-CM | POA: Diagnosis not present

## 2020-08-26 DIAGNOSIS — Z950 Presence of cardiac pacemaker: Secondary | ICD-10-CM | POA: Diagnosis not present

## 2020-08-26 DIAGNOSIS — N183 Chronic kidney disease, stage 3 unspecified: Secondary | ICD-10-CM | POA: Diagnosis not present

## 2020-08-26 DIAGNOSIS — N390 Urinary tract infection, site not specified: Secondary | ICD-10-CM | POA: Diagnosis not present

## 2020-08-26 DIAGNOSIS — I130 Hypertensive heart and chronic kidney disease with heart failure and stage 1 through stage 4 chronic kidney disease, or unspecified chronic kidney disease: Secondary | ICD-10-CM | POA: Diagnosis not present

## 2020-08-26 DIAGNOSIS — R600 Localized edema: Secondary | ICD-10-CM | POA: Diagnosis not present

## 2020-08-26 DIAGNOSIS — E1122 Type 2 diabetes mellitus with diabetic chronic kidney disease: Secondary | ICD-10-CM | POA: Diagnosis not present

## 2020-08-26 DIAGNOSIS — I2510 Atherosclerotic heart disease of native coronary artery without angina pectoris: Secondary | ICD-10-CM | POA: Diagnosis not present

## 2020-08-26 DIAGNOSIS — I4891 Unspecified atrial fibrillation: Secondary | ICD-10-CM | POA: Diagnosis not present

## 2020-08-26 DIAGNOSIS — I359 Nonrheumatic aortic valve disorder, unspecified: Secondary | ICD-10-CM | POA: Diagnosis not present

## 2020-08-26 DIAGNOSIS — R531 Weakness: Secondary | ICD-10-CM | POA: Diagnosis not present

## 2020-08-27 ENCOUNTER — Telehealth: Payer: Self-pay

## 2020-08-27 NOTE — Telephone Encounter (Signed)
Transition Care Management Unsuccessful Follow-up Telephone Call  Date of discharge and from where:  [facility] 08/26/20  Diagnosis: fall, weakness, UTI   Attempts:  1st Attempt  Reason for unsuccessful TCM follow-up call:  No answer/busy no voice mail  Patient or facility has called and made hosp f/u appt for 8/8 at 10:00 - will continue to attempt reaching patient   Transition Care Management Unsuccessful Follow-up Telephone Call  Date of discharge and from where:  [facility] 08/26/20  Diagnosis:  fall, weakness, UTI   Attempts:  2nd Attempt  Reason for unsuccessful TCM follow-up call:  No answer/busy no voice mail on cell

## 2020-08-28 ENCOUNTER — Ambulatory Visit (INDEPENDENT_AMBULATORY_CARE_PROVIDER_SITE_OTHER): Payer: Medicare Other | Admitting: Nurse Practitioner

## 2020-08-28 ENCOUNTER — Encounter: Payer: Self-pay | Admitting: Nurse Practitioner

## 2020-08-28 DIAGNOSIS — R059 Cough, unspecified: Secondary | ICD-10-CM | POA: Diagnosis not present

## 2020-08-28 NOTE — Telephone Encounter (Signed)
Transition Care Management Follow-up Telephone Call Date of discharge and from where: 08/26/20 [facility] Diagnosis:  fall, weakness, UTI How have you been since you were released from the hospital? Coughing, weak, tired, worried about how many medications he is taking Any questions or concerns? No  Items Reviewed: Did the pt receive and understand the discharge instructions provided? Yes  Medications obtained and verified? Yes  Other? No  Any new allergies since your discharge? No  Dietary orders reviewed? Yes Do you have support at home? Yes   Home Care and Equipment/Supplies: Were home health services ordered? Patient said he had to hang up and didn't answer - unsure if he has home health If so, what is the name of the agency? N/a  Has the agency set up a time to come to the patient's home? unknown Were any new equipment or medical supplies ordered?  unsure What is the name of the medical supply agency? N/a Were you able to get the supplies/equipment? unsure Do you have any questions related to the use of the equipment or supplies? Unable to ask  Functional Questionnaire: (I = Independent and D = Dependent) ADLs: I  Bathing/Dressing- I  Meal Prep- I  Eating- I  Maintaining continence- I  Transferring/Ambulation- I with walker - can only walk short distances  Managing Meds- I  Follow up appointments reviewed:  PCP Hospital f/u appt confirmed? Yes  Scheduled to see [place] on 08/31/20 @ 10. [place] [facility] f/u appt confirmed? No   Are transportation arrangements needed? No  If their condition worsens, is the pt aware to call PCP or go to the Emergency Dept.? Yes Was the patient provided with contact information for the PCP's office or ED? Yes Was to pt encouraged to call back with questions or concerns? Yes

## 2020-08-28 NOTE — Assessment & Plan Note (Signed)
Patient at this time do not want to be treated for cough and congestion.  he reports being in the hospital and spent  20 days.  Patient is concerned about having too many medications to take, he has an appointment with his PCP on Monday and prefers to wait till Monday.  Education provided to patient to please follow-up with worsening exacerbation of cough and congestion.  Patient verbalized understanding.

## 2020-08-28 NOTE — Progress Notes (Signed)
   Virtual Visit  Note Due to COVID-19 pandemic this visit was conducted virtually. This visit type was conducted due to national recommendations for restrictions regarding the COVID-19 Pandemic (e.g. social distancing, sheltering in place) in an effort to limit this patient's exposure and mitigate transmission in our community. All issues noted in this document were discussed and addressed.  A physical exam was not performed with this format.  I connected with [name] on 08/28/20 at 9:00 by telephone and verified that I am speaking with the correct person using two identifiers. [name] is currently located at home during visit. The provider, Ivy Lynn, NP is located in their office at time of visit.  I discussed the limitations, risks, security and privacy concerns of performing an evaluation and management service by telephone and the availability of in person appointments. I also discussed with the patient that there may be a patient responsible charge related to this service. The patient expressed understanding and agreed to proceed.   History and Present Illness:  HPI  Patient is reporting cough and congestion, no fever, body aches, chills, nausea or vomiting.  Symptoms present in the past few days and worsening by today.  Patient reports recently being in the hospital for 20 days and believes he did not have COVID even though he was told in the hospital that he had [place].  I encourage patient to please start a cough suppressant and decongestant but patient is not willing due to having too many medications currently.  Patient mentioned his appointment on Monday with PCP and will just hold off until then.  Advised patient to please seek emergency care if cough and congestion gets worse over the weekend.  Patient verbalized understanding.  Review of Systems  Constitutional: Negative.   HENT:  Positive for congestion.   Respiratory:  Positive for cough.   Cardiovascular:  Negative.   Skin: Negative.  Negative for rash.  All other systems reviewed and are negative.   Observations/Objective: Televisit patient not in distress.  Assessment and Plan: Cough Patient at this time do not want to be treated for cough and congestion.  he reports being in the hospital and spent  20 days.  Patient is concerned about having too many medications to take, he has an appointment with his PCP on Monday and prefers to wait till Monday.  Education provided to patient to please follow-up with worsening exacerbation of cough and congestion.  Patient verbalized understanding.   Follow Up Instructions: Follow-up with worsening unresolved symptoms.    I discussed the assessment and treatment plan with the patient. The patient was provided an opportunity to ask questions and all were answered. The patient agreed with the plan and demonstrated an understanding of the instructions.   The patient was advised to call back or seek an in-person evaluation if the symptoms worsen or if the condition fails to improve as anticipated.  The above assessment and management plan was discussed with the patient. The patient verbalized understanding of and has agreed to the management plan. Patient is aware to call the clinic if symptoms persist or worsen. Patient is aware when to return to the clinic for a follow-up visit. Patient educated on when it is appropriate to go to the emergency department.   Time call ended: 9:06 AM  I provided 6 minutes of  non face-to-face time during this encounter.    Ivy Lynn, NP

## 2020-08-29 DIAGNOSIS — Z743 Need for continuous supervision: Secondary | ICD-10-CM | POA: Diagnosis not present

## 2020-08-29 DIAGNOSIS — E162 Hypoglycemia, unspecified: Secondary | ICD-10-CM | POA: Diagnosis not present

## 2020-08-29 DIAGNOSIS — R079 Chest pain, unspecified: Secondary | ICD-10-CM | POA: Diagnosis not present

## 2020-08-29 DIAGNOSIS — J90 Pleural effusion, not elsewhere classified: Secondary | ICD-10-CM | POA: Diagnosis not present

## 2020-08-29 DIAGNOSIS — E161 Other hypoglycemia: Secondary | ICD-10-CM | POA: Diagnosis not present

## 2020-08-29 DIAGNOSIS — Z794 Long term (current) use of insulin: Secondary | ICD-10-CM | POA: Diagnosis not present

## 2020-08-29 DIAGNOSIS — E11649 Type 2 diabetes mellitus with hypoglycemia without coma: Secondary | ICD-10-CM | POA: Diagnosis not present

## 2020-08-29 DIAGNOSIS — J1282 Pneumonia due to coronavirus disease 2019: Secondary | ICD-10-CM | POA: Diagnosis not present

## 2020-08-29 DIAGNOSIS — U071 COVID-19: Secondary | ICD-10-CM | POA: Diagnosis not present

## 2020-08-29 DIAGNOSIS — R6889 Other general symptoms and signs: Secondary | ICD-10-CM | POA: Diagnosis not present

## 2020-08-29 DIAGNOSIS — R404 Transient alteration of awareness: Secondary | ICD-10-CM | POA: Diagnosis not present

## 2020-08-29 DIAGNOSIS — Z87891 Personal history of nicotine dependence: Secondary | ICD-10-CM | POA: Diagnosis not present

## 2020-08-31 ENCOUNTER — Inpatient Hospital Stay: Payer: Medicare Other | Admitting: Nurse Practitioner

## 2020-08-31 NOTE — Telephone Encounter (Signed)
Spoke with patient. He said he forgot all about appt today and needed to reshedule. Pt is calling for transportation and he will call us back to reshedule.

## 2020-09-07 ENCOUNTER — Telehealth: Payer: Self-pay | Admitting: Family Medicine

## 2020-09-07 NOTE — Telephone Encounter (Signed)
Appt made

## 2020-09-10 ENCOUNTER — Ambulatory Visit: Payer: No Typology Code available for payment source | Admitting: Internal Medicine

## 2020-09-10 ENCOUNTER — Encounter: Payer: Self-pay | Admitting: Internal Medicine

## 2020-09-16 ENCOUNTER — Inpatient Hospital Stay: Payer: Medicare Other | Admitting: Family Medicine

## 2020-09-19 DIAGNOSIS — I130 Hypertensive heart and chronic kidney disease with heart failure and stage 1 through stage 4 chronic kidney disease, or unspecified chronic kidney disease: Secondary | ICD-10-CM | POA: Diagnosis not present

## 2020-09-19 DIAGNOSIS — Z742 Need for assistance at home and no other household member able to render care: Secondary | ICD-10-CM | POA: Diagnosis not present

## 2020-09-19 DIAGNOSIS — N183 Chronic kidney disease, stage 3 unspecified: Secondary | ICD-10-CM | POA: Diagnosis not present

## 2020-09-19 DIAGNOSIS — I5032 Chronic diastolic (congestive) heart failure: Secondary | ICD-10-CM | POA: Diagnosis not present

## 2020-09-19 DIAGNOSIS — Z20822 Contact with and (suspected) exposure to covid-19: Secondary | ICD-10-CM | POA: Diagnosis not present

## 2020-09-19 DIAGNOSIS — R739 Hyperglycemia, unspecified: Secondary | ICD-10-CM | POA: Diagnosis not present

## 2020-09-19 DIAGNOSIS — I4891 Unspecified atrial fibrillation: Secondary | ICD-10-CM | POA: Diagnosis not present

## 2020-09-19 DIAGNOSIS — N390 Urinary tract infection, site not specified: Secondary | ICD-10-CM | POA: Diagnosis not present

## 2020-09-19 DIAGNOSIS — I10 Essential (primary) hypertension: Secondary | ICD-10-CM | POA: Diagnosis not present

## 2020-09-19 DIAGNOSIS — N471 Phimosis: Secondary | ICD-10-CM | POA: Diagnosis not present

## 2020-09-19 DIAGNOSIS — D631 Anemia in chronic kidney disease: Secondary | ICD-10-CM | POA: Diagnosis not present

## 2020-09-19 DIAGNOSIS — Z87891 Personal history of nicotine dependence: Secondary | ICD-10-CM | POA: Diagnosis not present

## 2020-09-19 DIAGNOSIS — W19XXXA Unspecified fall, initial encounter: Secondary | ICD-10-CM | POA: Diagnosis not present

## 2020-09-19 DIAGNOSIS — Z8616 Personal history of COVID-19: Secondary | ICD-10-CM | POA: Diagnosis not present

## 2020-09-19 DIAGNOSIS — Z7984 Long term (current) use of oral hypoglycemic drugs: Secondary | ICD-10-CM | POA: Diagnosis not present

## 2020-09-19 DIAGNOSIS — Z743 Need for continuous supervision: Secondary | ICD-10-CM | POA: Diagnosis not present

## 2020-09-19 DIAGNOSIS — R21 Rash and other nonspecific skin eruption: Secondary | ICD-10-CM | POA: Diagnosis not present

## 2020-09-19 DIAGNOSIS — R600 Localized edema: Secondary | ICD-10-CM | POA: Diagnosis not present

## 2020-09-19 DIAGNOSIS — E1165 Type 2 diabetes mellitus with hyperglycemia: Secondary | ICD-10-CM | POA: Diagnosis not present

## 2020-09-19 DIAGNOSIS — Z043 Encounter for examination and observation following other accident: Secondary | ICD-10-CM | POA: Diagnosis not present

## 2020-09-19 DIAGNOSIS — B49 Unspecified mycosis: Secondary | ICD-10-CM | POA: Diagnosis not present

## 2020-09-19 DIAGNOSIS — S70312A Abrasion, left thigh, initial encounter: Secondary | ICD-10-CM | POA: Diagnosis not present

## 2020-09-19 DIAGNOSIS — E1122 Type 2 diabetes mellitus with diabetic chronic kidney disease: Secondary | ICD-10-CM | POA: Diagnosis not present

## 2020-09-19 DIAGNOSIS — L22 Diaper dermatitis: Secondary | ICD-10-CM | POA: Diagnosis not present

## 2020-09-19 DIAGNOSIS — D649 Anemia, unspecified: Secondary | ICD-10-CM | POA: Diagnosis not present

## 2020-09-19 DIAGNOSIS — E119 Type 2 diabetes mellitus without complications: Secondary | ICD-10-CM | POA: Diagnosis not present

## 2020-09-19 DIAGNOSIS — Z79899 Other long term (current) drug therapy: Secondary | ICD-10-CM | POA: Diagnosis not present

## 2020-09-19 DIAGNOSIS — R296 Repeated falls: Secondary | ICD-10-CM | POA: Diagnosis not present

## 2020-09-19 DIAGNOSIS — B372 Candidiasis of skin and nail: Secondary | ICD-10-CM | POA: Diagnosis not present

## 2020-09-19 DIAGNOSIS — I2510 Atherosclerotic heart disease of native coronary artery without angina pectoris: Secondary | ICD-10-CM | POA: Diagnosis not present

## 2020-09-19 DIAGNOSIS — U071 COVID-19: Secondary | ICD-10-CM | POA: Diagnosis not present

## 2020-09-19 DIAGNOSIS — Z9181 History of falling: Secondary | ICD-10-CM | POA: Diagnosis not present

## 2020-09-19 DIAGNOSIS — I4821 Permanent atrial fibrillation: Secondary | ICD-10-CM | POA: Diagnosis not present

## 2020-09-19 DIAGNOSIS — N1831 Chronic kidney disease, stage 3a: Secondary | ICD-10-CM | POA: Diagnosis not present

## 2020-09-19 DIAGNOSIS — B356 Tinea cruris: Secondary | ICD-10-CM | POA: Diagnosis not present

## 2020-09-19 DIAGNOSIS — Z950 Presence of cardiac pacemaker: Secondary | ICD-10-CM | POA: Diagnosis not present

## 2020-09-20 DIAGNOSIS — I2510 Atherosclerotic heart disease of native coronary artery without angina pectoris: Secondary | ICD-10-CM | POA: Diagnosis not present

## 2020-09-20 DIAGNOSIS — Z950 Presence of cardiac pacemaker: Secondary | ICD-10-CM | POA: Diagnosis not present

## 2020-09-20 DIAGNOSIS — I10 Essential (primary) hypertension: Secondary | ICD-10-CM | POA: Diagnosis not present

## 2020-09-20 DIAGNOSIS — E119 Type 2 diabetes mellitus without complications: Secondary | ICD-10-CM | POA: Diagnosis not present

## 2020-09-20 DIAGNOSIS — R296 Repeated falls: Secondary | ICD-10-CM | POA: Diagnosis not present

## 2020-09-20 DIAGNOSIS — I4891 Unspecified atrial fibrillation: Secondary | ICD-10-CM | POA: Diagnosis not present

## 2020-09-20 DIAGNOSIS — N390 Urinary tract infection, site not specified: Secondary | ICD-10-CM | POA: Diagnosis not present

## 2020-09-20 DIAGNOSIS — U071 COVID-19: Secondary | ICD-10-CM | POA: Diagnosis not present

## 2020-09-21 ENCOUNTER — Encounter: Payer: Self-pay | Admitting: Family Medicine

## 2020-09-22 ENCOUNTER — Telehealth: Payer: Self-pay

## 2020-09-22 NOTE — Telephone Encounter (Addendum)
Transition Care Management Unsuccessful Follow-up Telephone Call  Date of discharge and from where:  09/21/20, [facility]  Diagnosis: Recurrent falls/UTI   Attempts:  1st Attempt  Reason for unsuccessful TCM follow-up call:  No answer/busy - home number  Transition Care Management Unsuccessful Follow-up Telephone Call  Date of discharge and from where:  09/21/20 [facility]  Diagnosis: Recurrent falls/UTI   Attempts:  2nd Attempt  Reason for unsuccessful TCM follow-up call:  No answer/busy  Transition Care Management Unsuccessful Follow-up Telephone Call  Date of discharge and from where:  09/21/2020 - [facility]  Diagnosis:  Recurrent Falls/ UTI   Attempts:  3rd Attempt  Reason for unsuccessful TCM follow-up call:  No answer/busy

## 2020-09-23 ENCOUNTER — Telehealth: Payer: Self-pay | Admitting: Family Medicine

## 2020-09-24 NOTE — Telephone Encounter (Signed)
Spoke with patient and offered hospital follow up appointment.  He wants to call us back and let us know when is convenient for him.

## 2020-10-10 ENCOUNTER — Other Ambulatory Visit: Payer: Self-pay

## 2020-10-10 ENCOUNTER — Emergency Department (HOSPITAL_COMMUNITY): Payer: No Typology Code available for payment source

## 2020-10-10 ENCOUNTER — Emergency Department (HOSPITAL_COMMUNITY)
Admission: EM | Admit: 2020-10-10 | Discharge: 2020-10-10 | Disposition: A | Discharge status: Home / Self Care | Payer: No Typology Code available for payment source | Attending: Emergency Medicine | Admitting: Emergency Medicine

## 2020-10-10 DIAGNOSIS — Z950 Presence of cardiac pacemaker: Secondary | ICD-10-CM | POA: Insufficient documentation

## 2020-10-10 DIAGNOSIS — I130 Hypertensive heart and chronic kidney disease with heart failure and stage 1 through stage 4 chronic kidney disease, or unspecified chronic kidney disease: Secondary | ICD-10-CM | POA: Diagnosis not present

## 2020-10-10 DIAGNOSIS — E1122 Type 2 diabetes mellitus with diabetic chronic kidney disease: Secondary | ICD-10-CM | POA: Diagnosis not present

## 2020-10-10 DIAGNOSIS — Z23 Encounter for immunization: Secondary | ICD-10-CM | POA: Diagnosis not present

## 2020-10-10 DIAGNOSIS — J449 Chronic obstructive pulmonary disease, unspecified: Secondary | ICD-10-CM | POA: Insufficient documentation

## 2020-10-10 DIAGNOSIS — Z794 Long term (current) use of insulin: Secondary | ICD-10-CM | POA: Diagnosis not present

## 2020-10-10 DIAGNOSIS — W19XXXA Unspecified fall, initial encounter: Secondary | ICD-10-CM | POA: Diagnosis not present

## 2020-10-10 DIAGNOSIS — S81812A Laceration without foreign body, left lower leg, initial encounter: Secondary | ICD-10-CM | POA: Diagnosis not present

## 2020-10-10 DIAGNOSIS — N1832 Chronic kidney disease, stage 3b: Secondary | ICD-10-CM | POA: Diagnosis not present

## 2020-10-10 DIAGNOSIS — Z8616 Personal history of COVID-19: Secondary | ICD-10-CM | POA: Diagnosis not present

## 2020-10-10 DIAGNOSIS — Z7984 Long term (current) use of oral hypoglycemic drugs: Secondary | ICD-10-CM | POA: Insufficient documentation

## 2020-10-10 DIAGNOSIS — I2510 Atherosclerotic heart disease of native coronary artery without angina pectoris: Secondary | ICD-10-CM | POA: Insufficient documentation

## 2020-10-10 DIAGNOSIS — Z7901 Long term (current) use of anticoagulants: Secondary | ICD-10-CM | POA: Insufficient documentation

## 2020-10-10 DIAGNOSIS — S0990XA Unspecified injury of head, initial encounter: Secondary | ICD-10-CM | POA: Diagnosis not present

## 2020-10-10 DIAGNOSIS — Z79899 Other long term (current) drug therapy: Secondary | ICD-10-CM | POA: Insufficient documentation

## 2020-10-10 DIAGNOSIS — I5032 Chronic diastolic (congestive) heart failure: Secondary | ICD-10-CM | POA: Diagnosis not present

## 2020-10-10 DIAGNOSIS — S80812A Abrasion, left lower leg, initial encounter: Secondary | ICD-10-CM | POA: Diagnosis not present

## 2020-10-10 DIAGNOSIS — Z87891 Personal history of nicotine dependence: Secondary | ICD-10-CM | POA: Insufficient documentation

## 2020-10-10 DIAGNOSIS — M899 Disorder of bone, unspecified: Secondary | ICD-10-CM

## 2020-10-10 DIAGNOSIS — S8992XA Unspecified injury of left lower leg, initial encounter: Secondary | ICD-10-CM | POA: Diagnosis present

## 2020-10-10 MED ORDER — DOXYCYCLINE HYCLATE 100 MG PO CAPS: 100.0000 mg | ORAL_CAPSULE | Freq: Two times a day (BID) | ORAL | 0 refills | Status: DC

## 2020-10-10 MED ORDER — BACITRACIN ZINC 500 UNIT/GM EX OINT: 1.0000 "application " | TOPICAL_OINTMENT | Freq: Two times a day (BID) | CUTANEOUS | 0 refills | Status: DC

## 2020-10-10 MED ORDER — DOXYCYCLINE HYCLATE 100 MG PO TABS
100.0000 mg | ORAL_TABLET | Freq: Once | ORAL | Status: AC
  Administered 2020-10-10: 100 mg via ORAL
  Filled 2020-10-10: qty 1

## 2020-10-10 MED ORDER — TETANUS-DIPHTH-ACELL PERTUSSIS 5-2.5-18.5 LF-MCG/0.5 IM SUSY
0.5000 mL | PREFILLED_SYRINGE | Freq: Once | INTRAMUSCULAR | Status: AC
  Administered 2020-10-10: 0.5 mL via INTRAMUSCULAR
  Filled 2020-10-10: qty 0.5

## 2020-10-10 NOTE — Discharge Instructions (Addendum)
You are seen in the ER for bleeding wound.  Fortunately, the wound is more of a skin abrasion and it should heal on its own over time.  Given that you are on blood thinner, bleeding might become an issue.  Keep the dressing we have provided for at least 24 hours, thereafter start changing the dressing twice a day, antibiotic ointment and oral antibiotics prescribed.  Reviewed the wound care instructions.  RETURN TO THE ER IF THERE IS INCREASED PAIN, REDNESS, PUS COMING OUT from the wound site.  Additionally, there was an incidental finding of abnormal bone density.  We would like you to see your primary care doctor for monitoring and further assessment of it.

## 2020-10-10 NOTE — ED Triage Notes (Signed)
Pt states he had a fall yesterday (9/16) and fell on his left leg. Pt takes eliquis.

## 2020-10-10 NOTE — ED Provider Notes (Signed)
[facility] EMERGENCY DEPARTMENT Provider Note   CSN: [id number] Arrival date & time: 10/10/20  [id number]     History Chief Complaint  Patient presents with  . Leg Injury    [name] is a 75 y.o. male.  HPI     75 year old male comes in with chief complaint of leg injury and fall.  Patient reports that he had a mechanical fall yesterday and struck his leg onto a post.  He also struck his head when he fell.  Currently is not having any headaches.  Pain is primarily located over his leg.  He has had some bleeding from the wound site, and decided to come to the ER today for further assessment.  Fall was yesterday evening around 5 PM.  Past Medical History:  Diagnosis Date  . Arthritis   . Atrial fibrillation ([place])   . BPH (benign prostatic hypertrophy) 04/15/2010  . Cataract   . Colon polyps   . DEPRESSION 01/22/2009  . Heart valve replaced   . HYPERCHOLESTEROLEMIA 01/22/2009  . HYPERTENSION 01/22/2009   Dr. [name]  . 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 Morbid obesity ([place])   . Severe nonproliferative diabetic retinopathy of both eyes ([place]) 08/27/2019  . Stage 3b chronic kidney disease ([place]) 09/18/2019  . Tremors of nervous system    ?ptsd  . Ulcer   . Vitreous hemorrhage of left eye ([place]) 08/27/2019    Patient Active Problem List   Diagnosis Date Noted  . Cough 08/28/2020  . COVID-19 virus infection 08/25/2020  . Status post fall 08/24/2020  . Status post biventricular pacemaker 08/24/2020  . Ischemic heart disease due to coronary artery obstruction ([place]) 08/24/2020  . Chronic diastolic heart failure ([place]) 08/24/2020  . Bilateral lower extremity edema 08/24/2020  . C7 cervical fracture ([place]) 08/07/2020  . Pacemaker 08/07/2020  . Generalized weakness 04/28/2020  . Orthostatic hypotension 04/28/2020  . Acute cystitis without hematuria   . Exudative age-related macular degeneration of right eye with active choroidal neovascularization  ([place]) 04/14/2020  . Skin tear of left lower leg without complication [id number]  . Tinea cruris 04/06/2020  . Hospital discharge follow-up 03/13/2020  . Ambulatory dysfunction 03/05/2020  . Essential tremor 03/05/2020  . Ataxia 03/03/2020  . Rash 01/03/2020  . Lumbar stenosis with neurogenic claudication 11/14/2019  . Stage 3b chronic kidney disease ([place]) 09/18/2019  . Syncope and collapse 09/16/2019  . Severe nonproliferative diabetic retinopathy of both eyes associated with type 2 diabetes mellitus ([place]) 08/27/2019  . 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 Posterior vitreous detachment of right eye 08/27/2019  . Severe nonproliferative diabetic retinopathy of right eye ([place]) 08/27/2019  . Frequency of urination and polyuria 01/29/2019  . Coronary artery disease involving native coronary artery of native heart without angina pectoris 01/27/2019  . Microalbuminuria 12/24/2018  . COPD (chronic obstructive pulmonary disease) ([place]) 06/25/2018  . Thrombocytopenia ([place]) 05/09/2018  . Diabetic neuropathy ([place]) 01/30/2018  . S/P AVR (aortic valve replacement) 11/04/2016  . Atrial fibrillation ([place]) 09/27/2016  . Symptomatic bradycardia 06/12/2016  . Urgency incontinence 11/10/2015  . Erectile dysfunction 11/10/2015  . Acute lower UTI 05/23/2015  . Hypothyroidism 01/15/2015  . 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 Phimosis 01/15/2015  . CHF (congestive heart failure) ([place]) 01/01/2015  . B12 deficiency 04/08/2014  . 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 Type 2 diabetes mellitus with diabetic neuropathy, unspecified ([place]) 03/12/2014  . 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  GERD (gastroesophageal reflux disease) 11/02/2012  . Dysuria 08/28/2012  . Anemia 04/02/2012  . Obesity 08/04/2010  . Benign prostatic hyperplasia 04/15/2010  . CAROTID OCCLUSIVE DISEASE 01/26/2010  . MURMUR 05/27/2009  . Aortic valve  disease 05/27/2009  . Hyperlipidemia with target LDL less than 100 01/22/2009  . Depression 01/22/2009  . Essential hypertension 01/22/2009    Past Surgical History:  Procedure Laterality Date  . 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 APPENDECTOMY    . bilateral cataract surg    . BIOPSY  05/01/2020   Procedure: BIOPSY;  Surgeon: Eloise Harman, DO;  Location: [facility];  Service: Endoscopy;;  . COLONOSCOPY  04/24/2007   [name]: normal  . COLONOSCOPY WITH ESOPHAGOGASTRODUODENOSCOPY (EGD) N/A 04/05/2012   normal rectum, somewhat elongated and redundant colon, otherwise normal. Remote history of colon polyps. EGD: normal esophagus, bile-stained gastric mucosa, diffuse patchy gastric erythema, small duodenal bulbar ulcer with associated erosions. Negative H.pylori. H.pylori serology also negative.   . COLONOSCOPY WITH PROPOFOL N/A 05/01/2020   Procedure: COLONOSCOPY WITH PROPOFOL;  Surgeon: Eloise Harman, DO;  Location: [facility];  Service: Endoscopy;  Laterality: N/A;  . DENTAL SURGERY  05/2004   Dental extractions  . ESOPHAGOGASTRODUODENOSCOPY (EGD) WITH PROPOFOL N/A 05/01/2020   Procedure: ESOPHAGOGASTRODUODENOSCOPY (EGD) WITH PROPOFOL;  Surgeon: Eloise Harman, DO;  Location: [facility];  Service: Endoscopy;  Laterality: N/A;  . EYE SURGERY Bilateral    cataract  . HEEL SPUR SURGERY Bilateral    resection of heel spur  . KNEE ARTHROSCOPY WITH LATERAL MENISECTOMY Right 04/04/2014   Procedure: KNEE ARTHROSCOPY WITH LATERAL MENISECTOMY;  Surgeon: Carole Civil, MD;  Location: [facility] OR[facility];  Service: Orthopedics;  Laterality: Right;  . PACEMAKER IMPLANT N/A 06/13/2016   Procedure: Pacemaker Implant- Dual Chamber;  Surgeon: Evans Lance, MD;  Location: [place] [facility];  Service: Cardiovascular;  Laterality: N/A;  . PACEMAKER INSERTION  2018  . THYROIDECTOMY  03/21/2013   DR [name]  . THYROIDECTOMY N/A 03/21/2013   Procedure:  THYROIDECTOMY;  Surgeon: [name], MD;  Location: [place];  Service: General;  Laterality: N/A;  . TONSILLECTOMY         Family History  Problem Relation Age of Onset  . Heart disease Father   . Congestive Heart Failure Father   . Arthritis Father   . Diabetes Other   . Benign prostatic hyperplasia Brother   . Colon cancer Neg Hx   . Colon polyps Neg Hx     Social History   Tobacco Use  . Smoking status: Former    Packs/day: 0.00    Years: 12.00    Pack years: 0.00    Types: Cigarettes    Quit date: 09/20/1961    Years since quitting: 59.0  . Smokeless tobacco: Never  Vaping Use  . Vaping Use: Never used  Substance Use Topics  . Alcohol use: Not Currently  . Drug use: No    Home Medications Prior to Admission medications   Medication Sig Start Date End Date Taking? Authorizing Provider  acetaminophen (TYLENOL) 500 MG tablet Take 1,000 mg by mouth every 6 (six) hours as needed (pain).    [provider]  amiodarone (PACERONE) 200 MG tablet Take 200 mg by mouth daily. 08/21/20   [provider]  apixaban (ELIQUIS) 5 MG TABS tablet Take 1 tablet (5 mg total) by mouth 2 (two) times daily. 02/17/20   Dettinger,  Fransisca Kaufmann, MD  cefdinir (OMNICEF) 300 MG capsule Take by mouth. 08/26/20   [provider]  cholecalciferol (VITAMIN D) 1000 units tablet Take 1,000 Units by mouth daily.     [provider]  ciprofloxacin (CIPRO) 500 MG tablet Take 1 tablet (500 mg total) by mouth 2 (two) times daily. 06/12/20   Dettinger, Fransisca Kaufmann, MD  dexamethasone (DECADRON) 6 MG tablet Take by mouth. 08/27/20   [provider]  furosemide (LASIX) 20 MG tablet Take by mouth.    [provider]  gabapentin (NEURONTIN) 300 MG capsule Take 1 capsule (300 mg total) by mouth 2 (two) times daily. 02/17/20   Dettinger, Fransisca Kaufmann, MD  glucose blood test strip 1 strip by Does not apply route 3 (three) times daily. Uses true metrix strips (diabetic club) 05/12/16    [provider]  guaiFENesin (MUCINEX) 600 MG 12 hr tablet Take by mouth. 08/26/20   [provider]  hydrocortisone cream 1 % Apply 1 application topically 2 (two) times daily. 04/06/20   Ivy Lynn, NP  insulin detemir (LEVEMIR FLEXTOUCH) 100 UNIT/ML FlexPen Inject 5 Units into the skin daily. 05/02/20   Hongalgi, Lenis Dickinson, MD  levothyroxine (SYNTHROID) 175 MCG tablet Take 1 tablet (175 mcg total) by mouth daily before breakfast. 02/17/20   Dettinger, Fransisca Kaufmann, MD  loperamide (IMODIUM A-D) 2 MG tablet Take 1 tablet (2 mg total) by mouth 4 (four) times daily as needed for diarrhea or loose stools. 03/19/20   Dettinger, Fransisca Kaufmann, MD  metFORMIN (GLUCOPHAGE) 1000 MG tablet TAKE 1 TABLET BY MOUTH TWICE DAILY WITH A MEAL. Patient taking differently: Take 1,000 mg by mouth in the morning and at bedtime. 07/24/15   Timmothy Euler, MD  pantoprazole (PROTONIX) 40 MG tablet Take 1 tablet (40 mg total) by mouth daily before breakfast. 05/03/20   Hongalgi, Lenis Dickinson, MD  predniSONE (DELTASONE) 20 MG tablet 2 po at same time daily for 5 days 07/23/20   Dettinger, Fransisca Kaufmann, MD  Semaglutide, 1 MG/DOSE, (OZEMPIC, 1 MG/DOSE,) 2 MG/1.5ML SOPN Inject 1 mg into the skin once a week. 02/17/20   Dettinger, Fransisca Kaufmann, MD  simvastatin (ZOCOR) 20 MG tablet SMARTSIG:1 Tablet(s) By Mouth Every Evening 08/21/20   [provider]  simvastatin (ZOCOR) 80 MG tablet Take 0.5 tablets (40 mg total) by mouth at bedtime. 04/04/17   Timmothy Euler, MD  tamsulosin (FLOMAX) 0.4 MG CAPS capsule TAKE TWO CAPSULES BY MOUTH AT BEDTIME 07/09/20   Dettinger, Fransisca Kaufmann, MD  vitamin B-12 (CYANOCOBALAMIN) 500 MCG tablet take one tablet BY MOUTH DAILY 05/02/20   [provider]    Allergies    Phenothiazines and Actos [pioglitazone]  Review of Systems   Review of Systems  Constitutional:  Positive for activity change.  Gastrointestinal:  Negative for abdominal pain.  Musculoskeletal:  Positive for myalgias.   Skin:  Positive for wound.  Neurological:  Positive for headaches.  Hematological:  Bruises/bleeds easily.   Physical Exam Updated Vital Signs BP (!) 164/99   Pulse 63   Temp 97.9 F (36.6 C) (Oral)   Resp 16   Ht '6\' 1"'$  (1.854 m)   Wt 99.8 kg   SpO2 100%   BMI 29.03 kg/m   Physical Exam Vitals and nursing note reviewed.  Constitutional:      Appearance: He is well-developed.  HENT:     Head: Atraumatic.  Cardiovascular:     Rate and Rhythm: Normal rate.  Pulmonary:  Effort: Pulmonary effort is normal.  Musculoskeletal:        General: Tenderness present. No deformity.     Cervical back: Neck supple.     Comments: Distal leg skin abrasion with surrounding ecchymoses.  Focal TTP  Skin:    General: Skin is warm.     Findings: Bruising and erythema present.  Neurological:     Mental Status: He is alert and oriented to person, place, and time.    ED Results / Procedures / Treatments   Labs (all labs ordered are listed, but only abnormal results are displayed) Labs Reviewed - No data to display  EKG None  Radiology No results found.  Procedures Procedures   Medications Ordered in ED Medications  doxycycline (VIBRA-TABS) tablet 100 mg (has no administration in time range)    ED Course  I have reviewed the triage vital signs and the nursing notes.  Pertinent labs & imaging results that were available during my care of the patient were reviewed by me and considered in my medical decision making (see chart for details).  Clinical Course as of 10/10/20 1131  Sat Oct 10, 2020  1123 DG Tibia/Fibula Left [RS]    Clinical Course User Index [RS] [place] Lovely   MDM Rules/Calculators/A&P                           Pt comes in with cc of fall. On eliquis. Fall was y'day.  He did strike his head, he is on Eliquis.  We had a shared decision making about whether to proceed on CT scan or not, with the recommendation to be get the CT head while  in the ER given the fall on Eliquis.  Patient agrees with that recommendation.  Additionally, he has skin tear and abrasion for the left distal leg with surrounding ecchymosis.  He is ambulating on it, but x-ray ordered to ensure there is no evidence of fracture/foreign bodies.  We will start wound management here with the irrigation of the wound site.  There is some blood oozing from the wound site likely because of Eliquis use.  We will put with clot gauze.  Ultimately he is stable for discharge with antibiotics for wound care given that he has history of severe diabetes and vascular disease -putting him at high risk for infection.  Final Clinical Impression(s) / ED Diagnoses Final diagnoses:  None    Rx / DC Orders ED Discharge Orders     None        Varney Biles, MD 10/10/20 1112

## 2020-10-12 ENCOUNTER — Encounter (INDEPENDENT_AMBULATORY_CARE_PROVIDER_SITE_OTHER): Payer: Self-pay

## 2020-10-12 DIAGNOSIS — E113513 Type 2 diabetes mellitus with proliferative diabetic retinopathy with macular edema, bilateral: Secondary | ICD-10-CM | POA: Diagnosis not present

## 2020-10-12 DIAGNOSIS — Z961 Presence of intraocular lens: Secondary | ICD-10-CM | POA: Diagnosis not present

## 2020-10-12 DIAGNOSIS — H40013 Open angle with borderline findings, low risk, bilateral: Secondary | ICD-10-CM | POA: Diagnosis not present

## 2020-10-12 DIAGNOSIS — H26492 Other secondary cataract, left eye: Secondary | ICD-10-CM | POA: Diagnosis not present

## 2020-10-12 DIAGNOSIS — H524 Presbyopia: Secondary | ICD-10-CM | POA: Diagnosis not present

## 2020-10-12 LAB — HM DIABETES EYE EXAM

## 2020-10-13 ENCOUNTER — Ambulatory Visit (INDEPENDENT_AMBULATORY_CARE_PROVIDER_SITE_OTHER): Payer: No Typology Code available for payment source

## 2020-10-13 ENCOUNTER — Telehealth: Payer: Self-pay

## 2020-10-13 DIAGNOSIS — I480 Paroxysmal atrial fibrillation: Secondary | ICD-10-CM | POA: Diagnosis not present

## 2020-10-13 LAB — CUP PACEART REMOTE DEVICE CHECK
Battery Remaining Longevity: 81 mo
Battery Voltage: 2.98 V
Brady Statistic RA Percent Paced: 0.36 %
Brady Statistic RV Percent Paced: 99.99 %
Date Time Interrogation Session: 20220920022517
Implantable Lead Implant Date: 20180521
Implantable Lead Implant Date: 20180521
Implantable Lead Location: 753859
Implantable Lead Location: 753859
Implantable Lead Model: 3830
Implantable Lead Model: 5076
Implantable Pulse Generator Implant Date: 20180521
Lead Channel Impedance Value: 247 Ohm
Lead Channel Impedance Value: 285 Ohm
Lead Channel Impedance Value: 304 Ohm
Lead Channel Impedance Value: 380 Ohm
Lead Channel Pacing Threshold Amplitude: 0.75 V
Lead Channel Pacing Threshold Amplitude: 0.75 V
Lead Channel Pacing Threshold Pulse Width: 0.4 ms
Lead Channel Pacing Threshold Pulse Width: 0.4 ms
Lead Channel Sensing Intrinsic Amplitude: 1.75 mV
Lead Channel Sensing Intrinsic Amplitude: 1.75 mV
Lead Channel Sensing Intrinsic Amplitude: 19.625 mV
Lead Channel Sensing Intrinsic Amplitude: 19.625 mV
Lead Channel Setting Pacing Amplitude: 1.5 V
Lead Channel Setting Pacing Amplitude: 2.5 V
Lead Channel Setting Pacing Pulse Width: 0.4 ms
Lead Channel Setting Sensing Sensitivity: 2 mV

## 2020-10-13 NOTE — Telephone Encounter (Signed)
Scheduled remote reviewed. Normal device function.   Persistent AF with V pacing, onset 07/18/20, rates paced. OAC- Eliquis, on Amiodarone. Routing for further review due to persistent AF.   Attempted to reach pt to assess symptoms and medication compliance.  No answer on either cell or home number.  Will need to try again later.

## 2020-10-15 ENCOUNTER — Ambulatory Visit: Payer: Medicare Other | Admitting: Family Medicine

## 2020-10-15 NOTE — Telephone Encounter (Signed)
Second attempt to reach patient.  No answer.  Voicemail not set up.  No emergency contact info on file.

## 2020-10-16 ENCOUNTER — Encounter (HOSPITAL_COMMUNITY): Payer: Self-pay

## 2020-10-16 ENCOUNTER — Emergency Department (HOSPITAL_COMMUNITY): Payer: No Typology Code available for payment source

## 2020-10-16 ENCOUNTER — Other Ambulatory Visit: Payer: Self-pay

## 2020-10-16 ENCOUNTER — Emergency Department (HOSPITAL_COMMUNITY)
Admission: EM | Admit: 2020-10-16 | Discharge: 2020-10-16 | Disposition: A | Discharge status: Home / Self Care | Payer: No Typology Code available for payment source | Attending: Emergency Medicine | Admitting: Emergency Medicine

## 2020-10-16 DIAGNOSIS — I4891 Unspecified atrial fibrillation: Secondary | ICD-10-CM | POA: Insufficient documentation

## 2020-10-16 DIAGNOSIS — I130 Hypertensive heart and chronic kidney disease with heart failure and stage 1 through stage 4 chronic kidney disease, or unspecified chronic kidney disease: Secondary | ICD-10-CM | POA: Insufficient documentation

## 2020-10-16 DIAGNOSIS — Z87891 Personal history of nicotine dependence: Secondary | ICD-10-CM | POA: Insufficient documentation

## 2020-10-16 DIAGNOSIS — I2510 Atherosclerotic heart disease of native coronary artery without angina pectoris: Secondary | ICD-10-CM | POA: Insufficient documentation

## 2020-10-16 DIAGNOSIS — E039 Hypothyroidism, unspecified: Secondary | ICD-10-CM | POA: Insufficient documentation

## 2020-10-16 DIAGNOSIS — Z79899 Other long term (current) drug therapy: Secondary | ICD-10-CM | POA: Diagnosis not present

## 2020-10-16 DIAGNOSIS — S8012XA Contusion of left lower leg, initial encounter: Secondary | ICD-10-CM | POA: Diagnosis not present

## 2020-10-16 DIAGNOSIS — J449 Chronic obstructive pulmonary disease, unspecified: Secondary | ICD-10-CM | POA: Insufficient documentation

## 2020-10-16 DIAGNOSIS — I5032 Chronic diastolic (congestive) heart failure: Secondary | ICD-10-CM | POA: Insufficient documentation

## 2020-10-16 DIAGNOSIS — Z950 Presence of cardiac pacemaker: Secondary | ICD-10-CM | POA: Insufficient documentation

## 2020-10-16 DIAGNOSIS — E1122 Type 2 diabetes mellitus with diabetic chronic kidney disease: Secondary | ICD-10-CM | POA: Insufficient documentation

## 2020-10-16 DIAGNOSIS — Z8616 Personal history of COVID-19: Secondary | ICD-10-CM | POA: Insufficient documentation

## 2020-10-16 DIAGNOSIS — Z794 Long term (current) use of insulin: Secondary | ICD-10-CM | POA: Insufficient documentation

## 2020-10-16 DIAGNOSIS — L089 Local infection of the skin and subcutaneous tissue, unspecified: Secondary | ICD-10-CM | POA: Diagnosis not present

## 2020-10-16 DIAGNOSIS — Z7901 Long term (current) use of anticoagulants: Secondary | ICD-10-CM | POA: Diagnosis not present

## 2020-10-16 DIAGNOSIS — Z7984 Long term (current) use of oral hypoglycemic drugs: Secondary | ICD-10-CM | POA: Diagnosis not present

## 2020-10-16 DIAGNOSIS — N1832 Chronic kidney disease, stage 3b: Secondary | ICD-10-CM | POA: Insufficient documentation

## 2020-10-16 DIAGNOSIS — S8992XA Unspecified injury of left lower leg, initial encounter: Secondary | ICD-10-CM | POA: Diagnosis present

## 2020-10-16 DIAGNOSIS — W01198A Fall on same level from slipping, tripping and stumbling with subsequent striking against other object, initial encounter: Secondary | ICD-10-CM | POA: Diagnosis not present

## 2020-10-16 LAB — CBC WITH DIFFERENTIAL/PLATELET
Abs Immature Granulocytes: 0.07 10*3/uL (ref 0.00–0.07)
Basophils Absolute: 0.1 10*3/uL (ref 0.0–0.1)
Basophils Relative: 1 %
Eosinophils Absolute: 0.1 10*3/uL (ref 0.0–0.5)
Eosinophils Relative: 2 %
HCT: 24.1 % — ABNORMAL LOW (ref 39.0–52.0)
Hemoglobin: 7.7 g/dL — ABNORMAL LOW (ref 13.0–17.0)
Immature Granulocytes: 1 %
Lymphocytes Relative: 22 %
Lymphs Abs: 1.3 10*3/uL (ref 0.7–4.0)
MCH: 33.6 pg (ref 26.0–34.0)
MCHC: 32 g/dL (ref 30.0–36.0)
MCV: 105.2 fL — ABNORMAL HIGH (ref 80.0–100.0)
Monocytes Absolute: 0.8 10*3/uL (ref 0.1–1.0)
Monocytes Relative: 13 %
Neutro Abs: 3.7 10*3/uL (ref 1.7–7.7)
Neutrophils Relative %: 61 %
Platelets: 174 10*3/uL (ref 150–400)
RBC: 2.29 MIL/uL — ABNORMAL LOW (ref 4.22–5.81)
RDW: 21.4 % — ABNORMAL HIGH (ref 11.5–15.5)
WBC: 6 10*3/uL (ref 4.0–10.5)
nRBC: 0 % (ref 0.0–0.2)

## 2020-10-16 LAB — COMPREHENSIVE METABOLIC PANEL
ALT: 11 U/L (ref 0–44)
AST: 17 U/L (ref 15–41)
Albumin: 3.1 g/dL — ABNORMAL LOW (ref 3.5–5.0)
Alkaline Phosphatase: 62 U/L (ref 38–126)
Anion gap: 7 (ref 5–15)
BUN: 25 mg/dL — ABNORMAL HIGH (ref 8–23)
CO2: 26 mmol/L (ref 22–32)
Calcium: 7.9 mg/dL — ABNORMAL LOW (ref 8.9–10.3)
Chloride: 104 mmol/L (ref 98–111)
Creatinine, Ser: 1.64 mg/dL — ABNORMAL HIGH (ref 0.61–1.24)
GFR, Estimated: 43 mL/min — ABNORMAL LOW (ref >60)
Glucose, Bld: 263 mg/dL — ABNORMAL HIGH (ref 70–99)
Potassium: 3.5 mmol/L (ref 3.5–5.1)
Sodium: 137 mmol/L (ref 135–145)
Total Bilirubin: 0.7 mg/dL (ref 0.3–1.2)
Total Protein: 6.5 g/dL (ref 6.5–8.1)

## 2020-10-16 LAB — LACTIC ACID, PLASMA: Lactic Acid, Venous: 1.4 mmol/L (ref 0.5–1.9)

## 2020-10-16 MED ORDER — CEFTRIAXONE SODIUM 1 G IJ SOLR
1.0000 g | Freq: Once | INTRAMUSCULAR | Status: AC
  Administered 2020-10-16: 1 g via INTRAMUSCULAR
  Filled 2020-10-16: qty 10

## 2020-10-16 MED ORDER — DOXYCYCLINE HYCLATE 100 MG PO TABS
100.0000 mg | ORAL_TABLET | Freq: Once | ORAL | Status: AC
  Administered 2020-10-16: 100 mg via ORAL
  Filled 2020-10-16: qty 1

## 2020-10-16 MED ORDER — CEPHALEXIN 500 MG PO CAPS: 500.0000 mg | ORAL_CAPSULE | Freq: Four times a day (QID) | ORAL | 0 refills | Status: DC

## 2020-10-16 NOTE — Discharge Instructions (Addendum)
Recheck at Urgent care in 2 days.  Take current antibiotic and add cephalexin   Return if fever or increased swelling.  Elevate leg,

## 2020-10-16 NOTE — ED Notes (Signed)
Urinal and warm blanket provided to patient.

## 2020-10-16 NOTE — Telephone Encounter (Signed)
Per last office visit in February 2022 with Dr. Jarold Motto was NOT taking amiodarone.  It appears at some point during his multiple hospitalizations since that visit that the amiodarone may have been restarted?  Pt should have follow up scheduled in [place] at next available time.  Will send to scheduling.

## 2020-10-16 NOTE — ED Notes (Signed)
Water provided to patient

## 2020-10-16 NOTE — Telephone Encounter (Signed)
Patient reports not feeling well over the past few months including fatigue. Denies shortness or palpitations. Reports of compliance with medications on file.  Patient will need upcoming apt. Towards the end of the year.   Advised I will forward to Dr. Lovena Le and review.

## 2020-10-16 NOTE — ED Triage Notes (Signed)
Pt presents to ED with continued left leg pain and swelling x 3 weeks.

## 2020-10-17 NOTE — ED Provider Notes (Signed)
[facility] EMERGENCY DEPARTMENT Provider Note   CSN: [id number] Arrival date & time: 10/16/20  1422     History Chief Complaint  Patient presents with   Leg Injury    [name] is a 75 y.o. male.  The history is provided by the patient. No language interpreter was used.  Leg Pain Location:  Leg Time since incident:  1 week Injury: yes   Mechanism of injury: fall   Fall:    Fall occurred:  From a bed   Entrapped after fall: no   Leg location:  L lower leg Pain details:    Quality:  Aching   Radiates to:  L leg   Severity:  Moderate   Onset quality:  Gradual   Duration:  7 days   Timing:  Constant   Progression:  Worsening Foreign body present:  No foreign bodies Prior injury to area:  Yes Relieved by:  Nothing   Pt reports he fell and hit his left leg,  Pt is on a blood thinner.  Pt reports leg swelled after fall.  Pt reports he now has oozing from wound.  Pt is finishing rx for doxycycline    Past Medical History:  Diagnosis Date   Arthritis    Atrial fibrillation (HCC)    BPH (benign prostatic hypertrophy) 04/15/2010   Cataract    Colon polyps    DEPRESSION 01/22/2009   Heart valve replaced    HYPERCHOLESTEROLEMIA 01/22/2009   HYPERTENSION 01/22/2009   Dr. 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) 08/24/2020   Chronic diastolic heart failure ([place]) 08/24/2020   Bilateral lower extremity edema 08/24/2020   C7 cervical  fracture ([place]) 08/07/2020   Pacemaker 08/07/2020   Generalized weakness 04/28/2020   Orthostatic hypotension 04/28/2020   Acute cystitis without hematuria    Exudative age-related macular degeneration of right eye with active choroidal neovascularization ([place]) 04/14/2020   Skin tear of left lower leg without complication 45/62/5638   Tinea cruris 04/06/2020   Hospital discharge follow-up 03/13/2020   Ambulatory dysfunction 03/05/2020   Essential tremor 03/05/2020   Ataxia 03/03/2020   Rash 01/03/2020   Lumbar stenosis with neurogenic claudication 11/14/2019   Stage 3b chronic kidney disease ([place]) 09/18/2019   Syncope and collapse 09/16/2019   Severe nonproliferative diabetic retinopathy of both eyes associated with type 2 diabetes mellitus ([place]) 08/27/2019   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 without macular edema ([place]) 08/27/2019   Posterior vitreous detachment of right eye 08/27/2019   Severe nonproliferative diabetic retinopathy of right eye ([place]) 08/27/2019   Frequency of urination and polyuria 01/29/2019   Coronary artery disease involving native coronary artery of native heart without angina pectoris 01/27/2019   Microalbuminuria 12/24/2018   COPD (chronic obstructive pulmonary disease) ([place]) 06/25/2018   Thrombocytopenia ([place]) 05/09/2018   Diabetic neuropathy ([place]) 01/30/2018   S/P AVR (aortic valve replacement) 11/04/2016   Atrial  fibrillation ([place]) 09/27/2016   Symptomatic bradycardia 06/12/2016   Urgency incontinence 11/10/2015   Erectile dysfunction 11/10/2015   Acute lower UTI 05/23/2015   Hypothyroidism 01/15/2015   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   Phimosis 01/15/2015   CHF (congestive heart failure) ([place]) 01/01/2015   B12 deficiency 04/08/2014   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   Type 2 diabetes mellitus with diabetic neuropathy, unspecified ([place]) 03/12/2014   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   GERD (gastroesophageal  reflux disease) 11/02/2012   Dysuria 08/28/2012   Anemia 04/02/2012   Obesity 08/04/2010   Benign prostatic hyperplasia 04/15/2010   CAROTID OCCLUSIVE DISEASE 01/26/2010   MURMUR 05/27/2009   Aortic valve disease 05/27/2009   Hyperlipidemia with target LDL less than 100 01/22/2009   Depression 01/22/2009   Essential hypertension 01/22/2009    Past Surgical History:  Procedure Laterality Date   AORTIC VALVE REPLACEMENT  July 2006   #20 stentless [place] porcine valve   APPENDECTOMY     bilateral cataract surg     BIOPSY  05/01/2020   Procedure: BIOPSY;  Surgeon: Eloise Harman, DO;  Location: [facility];  Service: Endoscopy;;   COLONOSCOPY  04/24/2007   [name]: normal   COLONOSCOPY WITH ESOPHAGOGASTRODUODENOSCOPY (EGD) N/A 04/05/2012   normal rectum, somewhat elongated and redundant colon, otherwise normal. Remote history of colon polyps. EGD: normal esophagus, bile-stained gastric mucosa, diffuse patchy gastric erythema, small duodenal bulbar ulcer with associated erosions. Negative H.pylori. H.pylori serology also negative.    COLONOSCOPY WITH PROPOFOL N/A 05/01/2020   Procedure: COLONOSCOPY WITH PROPOFOL;  Surgeon: Eloise Harman, DO;  Location: [facility];  Service: Endoscopy;  Laterality: N/A;   DENTAL SURGERY  05/2004   Dental extractions   ESOPHAGOGASTRODUODENOSCOPY (EGD) WITH PROPOFOL N/A 05/01/2020   Procedure: ESOPHAGOGASTRODUODENOSCOPY (EGD) WITH PROPOFOL;  Surgeon: Eloise Harman, DO;  Location: [facility];  Service: Endoscopy;  Laterality: N/A;   EYE SURGERY Bilateral    cataract   HEEL SPUR SURGERY Bilateral    resection of heel spur   KNEE ARTHROSCOPY WITH LATERAL MENISECTOMY Right 04/04/2014   Procedure: KNEE ARTHROSCOPY WITH LATERAL MENISECTOMY;  Surgeon: Carole Civil, MD;  Location: [facility] ORS;  Service: Orthopedics;  Laterality: Right;   PACEMAKER IMPLANT N/A 06/13/2016   Procedure: Pacemaker Implant- Dual Chamber;  Surgeon: Evans Lance, MD;   Location: [facility];  Service: Cardiovascular;  Laterality: N/A;   PACEMAKER INSERTION  2018   THYROIDECTOMY  03/21/2013   DR [name]   THYROIDECTOMY N/A 03/21/2013   Procedure: THYROIDECTOMY;  Surgeon: [name], MD;  Location: [facility];  Service: General;  Laterality: N/A;   TONSILLECTOMY         Family History  Problem Relation Age of Onset   Heart disease Father    Congestive Heart Failure Father    Arthritis Father    Diabetes Other    Benign prostatic hyperplasia Brother    Colon cancer Neg Hx    Colon polyps Neg Hx     Social History   Tobacco Use   Smoking status: Former    Packs/day: 0.00    Years: 12.00    Pack years: 0.00    Types: Cigarettes    Quit date: 09/20/1961    Years since quitting: 59.1   Smokeless tobacco: Never  Vaping Use   Vaping Use: Never used  Substance Use Topics   Alcohol use: Not Currently   Drug use: No    Home Medications Prior to Admission medications  Medication Sig Start Date End Date Taking? Authorizing Provider  cephALEXin (KEFLEX) 500 MG capsule Take 1 capsule (500 mg total) by mouth 4 (four) times daily for 7 days. 10/16/20 10/23/20 Yes Fransico Meadow, PA-C  acetaminophen (TYLENOL) 500 MG tablet Take 1,000 mg by mouth every 6 (six) hours as needed (pain).    [provider]  amiodarone (PACERONE) 200 MG tablet Take 200 mg by mouth daily. 08/21/20   [provider]  apixaban (ELIQUIS) 5 MG TABS tablet Take 1 tablet (5 mg total) by mouth 2 (two) times daily. 02/17/20   Dettinger, Fransisca Kaufmann, MD  bacitracin ointment Apply 1 application topically 2 (two) times daily. Start applying antibiotic ointment starting tomorrow 10/10/20   Varney Biles, MD  cholecalciferol (VITAMIN D) 1000 units tablet Take 1,000 Units by mouth daily.     [provider]  dexamethasone (DECADRON) 6 MG tablet Take by mouth. 08/27/20   [provider]  furosemide (LASIX) 20 MG tablet Take by mouth.    [provider]  gabapentin (NEURONTIN) 300 MG capsule Take 1 capsule (300 mg total) by mouth 2 (two) times daily. 02/17/20   Dettinger, Fransisca Kaufmann, MD  glucose blood test strip 1 strip by Does not apply route 3 (three) times daily. Uses true metrix strips (diabetic club) 05/12/16   [provider]  guaiFENesin (MUCINEX) 600 MG 12 hr tablet Take by mouth. 08/26/20   [provider]  hydrocortisone cream 1 % Apply 1 application topically 2 (two) times daily. 04/06/20   Ivy Lynn, NP  insulin detemir (LEVEMIR FLEXTOUCH) 100 UNIT/ML FlexPen Inject 5 Units into the skin daily. 05/02/20   Hongalgi, Lenis Dickinson, MD  levothyroxine (SYNTHROID) 175 MCG tablet Take 1 tablet (175 mcg total) by mouth daily before breakfast. 02/17/20   Dettinger, Fransisca Kaufmann, MD  loperamide (IMODIUM A-D) 2 MG tablet Take 1 tablet (2 mg total) by mouth 4 (four) times daily as needed for diarrhea or loose stools. 03/19/20   Dettinger, Fransisca Kaufmann, MD  metFORMIN (GLUCOPHAGE) 1000 MG tablet TAKE 1 TABLET BY MOUTH TWICE DAILY WITH A MEAL. Patient taking differently: Take 1,000 mg by mouth in the morning and at bedtime. 07/24/15   Timmothy Euler, MD  pantoprazole (PROTONIX) 40 MG tablet Take 1 tablet (40 mg total) by mouth daily before breakfast. 05/03/20   Hongalgi, Lenis Dickinson, MD  predniSONE (DELTASONE) 20 MG tablet 2 po at same time daily for 5 days 07/23/20   Dettinger, Fransisca Kaufmann, MD  Semaglutide, 1 MG/DOSE, (OZEMPIC, 1 MG/DOSE,) 2 MG/1.5ML SOPN Inject 1 mg into the skin once a week. 02/17/20   Dettinger, Fransisca Kaufmann, MD  simvastatin (ZOCOR) 20 MG tablet SMARTSIG:1 Tablet(s) By Mouth Every Evening 08/21/20   [provider]  simvastatin (ZOCOR) 80 MG tablet Take 0.5 tablets (40 mg total) by mouth at bedtime. 04/04/17   Timmothy Euler, MD  tamsulosin (FLOMAX) 0.4 MG CAPS capsule TAKE TWO CAPSULES BY MOUTH AT BEDTIME 07/09/20   Dettinger, Fransisca Kaufmann, MD  vitamin B-12 (CYANOCOBALAMIN) 500 MCG tablet take one tablet BY MOUTH DAILY  05/02/20   [provider]    Allergies    Phenothiazines and Actos [pioglitazone]  Review of Systems   Review of Systems  All other systems reviewed and are negative.  Physical Exam Updated Vital Signs BP 128/78 (BP Location: Right Arm)   Pulse 62   Temp 98.3 F (36.8 C) (Oral)   Resp 16   SpO2 100%  Physical Exam Vitals and nursing note reviewed.  Constitutional:      Appearance: He is well-developed.  HENT:     Head: Normocephalic and atraumatic.  Eyes:     Conjunctiva/sclera: Conjunctivae normal.  Cardiovascular:     Rate and Rhythm: Normal rate and regular rhythm.     Heart sounds: No murmur heard. Pulmonary:     Effort: Pulmonary effort is normal. No respiratory distress.     Breath sounds: Normal breath sounds.  Abdominal:     Palpations: Abdomen is soft.     Tenderness: There is no abdominal tenderness.  Musculoskeletal:     Cervical back: Neck supple.  Skin:    General: Skin is warm and dry.  Neurological:     Mental Status: He is alert.    ED Results / Procedures / Treatments   Labs (all labs ordered are listed, but only abnormal results are displayed) Labs Reviewed  COMPREHENSIVE METABOLIC PANEL - Abnormal; Notable for the following components:      Result Value   Glucose, Bld 263 (*)    BUN 25 (*)    Creatinine, Ser 1.64 (*)    Calcium 7.9 (*)    Albumin 3.1 (*)    GFR, Estimated 43 (*)    All other components within normal limits  CBC WITH DIFFERENTIAL/PLATELET - Abnormal; Notable for the following components:   RBC 2.29 (*)    Hemoglobin 7.7 (*)    HCT 24.1 (*)    MCV 105.2 (*)    RDW 21.4 (*)    All other components within normal limits  LACTIC ACID, PLASMA    EKG None  Radiology DG Tibia/Fibula Left  Result Date: 10/16/2020 CLINICAL DATA:  Left leg injury EXAM: LEFT TIBIA AND FIBULA - 2 VIEW COMPARISON:  None. FINDINGS: Normal alignment. No fracture or dislocation. Mature periosteal reaction involving the a posteromedial  proximal left tibia may reflect the sequela of remote trauma or inflammation as can be seen in the setting of a remote stress fracture. Advanced vascular calcifications are seen within the left lower extremity. There is mild diffuse subcutaneous edema. There is more focal asymmetric soft tissue swelling lateral to the mid diaphysis of the fibula. No retained radiopaque foreign body. IMPRESSION: Asymmetric lateral soft tissue swelling in keeping with the given history of local trauma. No fracture or dislocation. Electronically Signed   By: Fidela Salisbury M.D.   On: 10/16/2020 20:07    Procedures Procedures   Medications Ordered in ED Medications  cefTRIAXone (ROCEPHIN) injection 1 g (1 g Intramuscular Given 10/16/20 2150)  doxycycline (VIBRA-TABS) tablet 100 mg (100 mg Oral Given 10/16/20 2150)    ED Course  I have reviewed the triage vital signs and the nursing notes.  Pertinent labs & imaging results that were available during my care of the patient were reviewed by me and considered in my medical decision making (see chart for details).    MDM Rules/Calculators/A&P                           MDM:  I think swelling is second to hematoma.  Leg is not hot to touch.  Pt has diffuse bruising  Pt given Rocephin 1 gram IM.  Pt given rx for keflex.  Pt has a normal wbc count  Xray no change.  Pt has a hemoglobin of 7.7   Pt advised he needs to recheck in 2 days.  Follow up for anemia.  Final Clinical Impression(s) / ED Diagnoses Final diagnoses:  Wound infection    Rx / DC Orders ED Discharge Orders          Ordered    cephALEXin (KEFLEX) 500 MG capsule  4 times daily        10/16/20 2148          An After Visit Summary was printed and given to the patient.    Fransico Meadow, [place] 10/17/20 [id number]    Milton Ferguson, MD 10/19/20 1126

## 2020-10-17 NOTE — Telephone Encounter (Signed)
Agree with plans for followup. GT

## 2020-10-20 NOTE — Progress Notes (Signed)
Remote pacemaker transmission.   

## 2020-10-21 ENCOUNTER — Emergency Department (HOSPITAL_COMMUNITY): Payer: No Typology Code available for payment source

## 2020-10-21 ENCOUNTER — Encounter (HOSPITAL_COMMUNITY): Payer: Self-pay

## 2020-10-21 ENCOUNTER — Inpatient Hospital Stay (HOSPITAL_COMMUNITY)
Admission: EM | Admit: 2020-10-21 | Discharge: 2020-11-10 | DRG: 573 | Disposition: A | Discharge status: Skilled Nursing Facility | Payer: No Typology Code available for payment source | Attending: Internal Medicine | Admitting: Internal Medicine

## 2020-10-21 DIAGNOSIS — Z9181 History of falling: Secondary | ICD-10-CM

## 2020-10-21 DIAGNOSIS — E1140 Type 2 diabetes mellitus with diabetic neuropathy, unspecified: Secondary | ICD-10-CM | POA: Diagnosis present

## 2020-10-21 DIAGNOSIS — E669 Obesity, unspecified: Secondary | ICD-10-CM | POA: Diagnosis present

## 2020-10-21 DIAGNOSIS — S8012XA Contusion of left lower leg, initial encounter: Secondary | ICD-10-CM | POA: Diagnosis present

## 2020-10-21 DIAGNOSIS — E1122 Type 2 diabetes mellitus with diabetic chronic kidney disease: Secondary | ICD-10-CM | POA: Diagnosis present

## 2020-10-21 DIAGNOSIS — W1830XA Fall on same level, unspecified, initial encounter: Secondary | ICD-10-CM | POA: Diagnosis present

## 2020-10-21 DIAGNOSIS — Z9114 Patient's other noncompliance with medication regimen: Secondary | ICD-10-CM

## 2020-10-21 DIAGNOSIS — N390 Urinary tract infection, site not specified: Secondary | ICD-10-CM

## 2020-10-21 DIAGNOSIS — I442 Atrioventricular block, complete: Secondary | ICD-10-CM | POA: Diagnosis present

## 2020-10-21 DIAGNOSIS — K5900 Constipation, unspecified: Secondary | ICD-10-CM | POA: Diagnosis not present

## 2020-10-21 DIAGNOSIS — Z6830 Body mass index (BMI) 30.0-30.9, adult: Secondary | ICD-10-CM

## 2020-10-21 DIAGNOSIS — Z7985 Long-term (current) use of injectable non-insulin antidiabetic drugs: Secondary | ICD-10-CM

## 2020-10-21 DIAGNOSIS — L97925 Non-pressure chronic ulcer of unspecified part of left lower leg with muscle involvement without evidence of necrosis: Secondary | ICD-10-CM

## 2020-10-21 DIAGNOSIS — K5909 Other constipation: Secondary | ICD-10-CM

## 2020-10-21 DIAGNOSIS — Z79899 Other long term (current) drug therapy: Secondary | ICD-10-CM

## 2020-10-21 DIAGNOSIS — L97923 Non-pressure chronic ulcer of unspecified part of left lower leg with necrosis of muscle: Principal | ICD-10-CM | POA: Diagnosis present

## 2020-10-21 DIAGNOSIS — E113493 Type 2 diabetes mellitus with severe nonproliferative diabetic retinopathy without macular edema, bilateral: Secondary | ICD-10-CM | POA: Diagnosis present

## 2020-10-21 DIAGNOSIS — Z86010 Personal history of colonic polyps: Secondary | ICD-10-CM

## 2020-10-21 DIAGNOSIS — I130 Hypertensive heart and chronic kidney disease with heart failure and stage 1 through stage 4 chronic kidney disease, or unspecified chronic kidney disease: Secondary | ICD-10-CM | POA: Diagnosis present

## 2020-10-21 DIAGNOSIS — R4182 Altered mental status, unspecified: Secondary | ICD-10-CM | POA: Diagnosis not present

## 2020-10-21 DIAGNOSIS — E861 Hypovolemia: Secondary | ICD-10-CM | POA: Diagnosis present

## 2020-10-21 DIAGNOSIS — N39490 Overflow incontinence: Secondary | ICD-10-CM | POA: Diagnosis present

## 2020-10-21 DIAGNOSIS — Z952 Presence of prosthetic heart valve: Secondary | ICD-10-CM

## 2020-10-21 DIAGNOSIS — Z950 Presence of cardiac pacemaker: Secondary | ICD-10-CM

## 2020-10-21 DIAGNOSIS — Z794 Long term (current) use of insulin: Secondary | ICD-10-CM

## 2020-10-21 DIAGNOSIS — N179 Acute kidney failure, unspecified: Secondary | ICD-10-CM | POA: Diagnosis present

## 2020-10-21 DIAGNOSIS — G9349 Other encephalopathy: Secondary | ICD-10-CM | POA: Diagnosis present

## 2020-10-21 DIAGNOSIS — M1990 Unspecified osteoarthritis, unspecified site: Secondary | ICD-10-CM | POA: Diagnosis present

## 2020-10-21 DIAGNOSIS — D539 Nutritional anemia, unspecified: Secondary | ICD-10-CM | POA: Diagnosis present

## 2020-10-21 DIAGNOSIS — Z833 Family history of diabetes mellitus: Secondary | ICD-10-CM

## 2020-10-21 DIAGNOSIS — E43 Unspecified severe protein-calorie malnutrition: Secondary | ICD-10-CM | POA: Diagnosis present

## 2020-10-21 DIAGNOSIS — I10 Essential (primary) hypertension: Secondary | ICD-10-CM

## 2020-10-21 DIAGNOSIS — Z79890 Hormone replacement therapy (postmenopausal): Secondary | ICD-10-CM

## 2020-10-21 DIAGNOSIS — E890 Postprocedural hypothyroidism: Secondary | ICD-10-CM | POA: Diagnosis present

## 2020-10-21 DIAGNOSIS — Z8619 Personal history of other infectious and parasitic diseases: Secondary | ICD-10-CM

## 2020-10-21 DIAGNOSIS — Z7984 Long term (current) use of oral hypoglycemic drugs: Secondary | ICD-10-CM

## 2020-10-21 DIAGNOSIS — Z7901 Long term (current) use of anticoagulants: Secondary | ICD-10-CM

## 2020-10-21 DIAGNOSIS — I5032 Chronic diastolic (congestive) heart failure: Secondary | ICD-10-CM

## 2020-10-21 DIAGNOSIS — E860 Dehydration: Secondary | ICD-10-CM | POA: Diagnosis present

## 2020-10-21 DIAGNOSIS — Z7952 Long term (current) use of systemic steroids: Secondary | ICD-10-CM

## 2020-10-21 DIAGNOSIS — Z888 Allergy status to other drugs, medicaments and biological substances status: Secondary | ICD-10-CM

## 2020-10-21 DIAGNOSIS — D509 Iron deficiency anemia, unspecified: Secondary | ICD-10-CM | POA: Diagnosis present

## 2020-10-21 DIAGNOSIS — G9340 Encephalopathy, unspecified: Secondary | ICD-10-CM

## 2020-10-21 DIAGNOSIS — I4821 Permanent atrial fibrillation: Secondary | ICD-10-CM | POA: Diagnosis present

## 2020-10-21 DIAGNOSIS — Z87440 Personal history of urinary (tract) infections: Secondary | ICD-10-CM

## 2020-10-21 DIAGNOSIS — Z87891 Personal history of nicotine dependence: Secondary | ICD-10-CM

## 2020-10-21 DIAGNOSIS — L02416 Cutaneous abscess of left lower limb: Secondary | ICD-10-CM | POA: Diagnosis present

## 2020-10-21 DIAGNOSIS — E1152 Type 2 diabetes mellitus with diabetic peripheral angiopathy with gangrene: Secondary | ICD-10-CM | POA: Diagnosis present

## 2020-10-21 DIAGNOSIS — Z1624 Resistance to multiple antibiotics: Secondary | ICD-10-CM | POA: Diagnosis present

## 2020-10-21 DIAGNOSIS — E039 Hypothyroidism, unspecified: Secondary | ICD-10-CM | POA: Diagnosis present

## 2020-10-21 DIAGNOSIS — D631 Anemia in chronic kidney disease: Secondary | ICD-10-CM | POA: Diagnosis present

## 2020-10-21 DIAGNOSIS — R339 Retention of urine, unspecified: Secondary | ICD-10-CM

## 2020-10-21 DIAGNOSIS — Z8249 Family history of ischemic heart disease and other diseases of the circulatory system: Secondary | ICD-10-CM

## 2020-10-21 DIAGNOSIS — N3941 Urge incontinence: Secondary | ICD-10-CM | POA: Diagnosis present

## 2020-10-21 DIAGNOSIS — F05 Delirium due to known physiological condition: Secondary | ICD-10-CM | POA: Diagnosis not present

## 2020-10-21 DIAGNOSIS — N401 Enlarged prostate with lower urinary tract symptoms: Secondary | ICD-10-CM | POA: Diagnosis present

## 2020-10-21 DIAGNOSIS — N400 Enlarged prostate without lower urinary tract symptoms: Secondary | ICD-10-CM | POA: Diagnosis present

## 2020-10-21 DIAGNOSIS — Z8261 Family history of arthritis: Secondary | ICD-10-CM

## 2020-10-21 DIAGNOSIS — E7800 Pure hypercholesterolemia, unspecified: Secondary | ICD-10-CM | POA: Diagnosis present

## 2020-10-21 DIAGNOSIS — R809 Proteinuria, unspecified: Secondary | ICD-10-CM | POA: Diagnosis present

## 2020-10-21 DIAGNOSIS — J90 Pleural effusion, not elsewhere classified: Secondary | ICD-10-CM

## 2020-10-21 DIAGNOSIS — Z20822 Contact with and (suspected) exposure to covid-19: Secondary | ICD-10-CM | POA: Diagnosis present

## 2020-10-21 DIAGNOSIS — F0390 Unspecified dementia without behavioral disturbance: Secondary | ICD-10-CM | POA: Diagnosis present

## 2020-10-21 DIAGNOSIS — R946 Abnormal results of thyroid function studies: Secondary | ICD-10-CM | POA: Diagnosis present

## 2020-10-21 DIAGNOSIS — N35919 Unspecified urethral stricture, male, unspecified site: Secondary | ICD-10-CM | POA: Diagnosis present

## 2020-10-21 DIAGNOSIS — I5033 Acute on chronic diastolic (congestive) heart failure: Secondary | ICD-10-CM | POA: Diagnosis present

## 2020-10-21 DIAGNOSIS — N1832 Chronic kidney disease, stage 3b: Secondary | ICD-10-CM | POA: Diagnosis present

## 2020-10-21 LAB — COMPREHENSIVE METABOLIC PANEL
ALT: 14 U/L (ref 0–44)
AST: 21 U/L (ref 15–41)
Albumin: 3.1 g/dL — ABNORMAL LOW (ref 3.5–5.0)
Alkaline Phosphatase: 63 U/L (ref 38–126)
Anion gap: 10 (ref 5–15)
BUN: 16 mg/dL (ref 8–23)
CO2: 23 mmol/L (ref 22–32)
Calcium: 8.7 mg/dL — ABNORMAL LOW (ref 8.9–10.3)
Chloride: 106 mmol/L (ref 98–111)
Creatinine, Ser: 1.55 mg/dL — ABNORMAL HIGH (ref 0.61–1.24)
GFR, Estimated: 46 mL/min — ABNORMAL LOW (ref >60)
Glucose, Bld: 212 mg/dL — ABNORMAL HIGH (ref 70–99)
Potassium: 3.6 mmol/L (ref 3.5–5.1)
Sodium: 139 mmol/L (ref 135–145)
Total Bilirubin: 1.6 mg/dL — ABNORMAL HIGH (ref 0.3–1.2)
Total Protein: 6.5 g/dL (ref 6.5–8.1)

## 2020-10-21 LAB — CBC WITH DIFFERENTIAL/PLATELET
Abs Immature Granulocytes: 0.07 10*3/uL (ref 0.00–0.07)
Basophils Absolute: 0.1 10*3/uL (ref 0.0–0.1)
Basophils Relative: 1 %
Eosinophils Absolute: 0.1 10*3/uL (ref 0.0–0.5)
Eosinophils Relative: 2 %
HCT: 24.8 % — ABNORMAL LOW (ref 39.0–52.0)
Hemoglobin: 7.8 g/dL — ABNORMAL LOW (ref 13.0–17.0)
Immature Granulocytes: 1 %
Lymphocytes Relative: 14 %
Lymphs Abs: 1 10*3/uL (ref 0.7–4.0)
MCH: 33.3 pg (ref 26.0–34.0)
MCHC: 31.5 g/dL (ref 30.0–36.0)
MCV: 106 fL — ABNORMAL HIGH (ref 80.0–100.0)
Monocytes Absolute: 0.8 10*3/uL (ref 0.1–1.0)
Monocytes Relative: 11 %
Neutro Abs: 5.5 10*3/uL (ref 1.7–7.7)
Neutrophils Relative %: 71 %
Platelets: 197 10*3/uL (ref 150–400)
RBC: 2.34 MIL/uL — ABNORMAL LOW (ref 4.22–5.81)
RDW: 21.3 % — ABNORMAL HIGH (ref 11.5–15.5)
WBC: 7.6 10*3/uL (ref 4.0–10.5)
nRBC: 0 % (ref 0.0–0.2)

## 2020-10-21 LAB — CK: Total CK: 74 U/L (ref 49–397)

## 2020-10-21 LAB — ETHANOL: Alcohol, Ethyl (B): <10 mg/dL (ref <10)

## 2020-10-21 LAB — AMMONIA: Ammonia: 14 umol/L (ref 9–35)

## 2020-10-21 NOTE — ED Triage Notes (Signed)
Pt comes via [facility], pt has been sitting in his car for the past 11 hours outside of work because he ran out of gas, does not know how he got there. Pt hypertensive with EMS, Pt AxO x 3

## 2020-10-21 NOTE — ED Provider Notes (Signed)
Emergency Medicine Provider Triage Evaluation Note  [name] , a 75 y.o. male  was evaluated in triage.  Pt complains of altered mental status.  Per EMS patient was found sitting in his car for the past 11 hours.  Apparently he was on his way to the [place] to see his physician when he apparently either ran out of gas or his battery went dead.  Apparently he was sitting outside of a business and they noticed after returning to work that he was still there in his car.  Patient is alert to self, place.  He is aware that Barbette Or is president however he tells me he thinks it is 60.  He knows that it is September.  He lives alone.  He has no complaints at this time however cannot recall what happened.  Review of Systems  Positive: + AMS, confusion Negative: - weakness, numbness, pain  Physical Exam  BP (!) 169/86 (BP Location: Left Arm)   Pulse 72   Temp 98.3 F (36.8 C) (Oral)   Resp 18   SpO2 100%  Gen:   Awake, no distress   Resp:  Normal effort  MSK:   Moves extremities without difficulty  Other:  Alert to self and place. Knows it is September however is confused on the year. Following commands without difficulty. No focal neuro deficits.   Medical Decision Making  Medically screening exam initiated at 8:18 PM.  Appropriate orders placed.  Rc Amison Bogen was informed that the remainder of the evaluation will be completed by another provider, this initial triage assessment does not replace that evaluation, and the importance of remaining in the ED until their evaluation is complete.     Eustaquio Maize, PA-C 10/21/20 2021    Milton Ferguson, MD 10/21/20 2139

## 2020-10-22 ENCOUNTER — Inpatient Hospital Stay (HOSPITAL_COMMUNITY): Payer: No Typology Code available for payment source

## 2020-10-22 ENCOUNTER — Other Ambulatory Visit: Payer: Self-pay

## 2020-10-22 DIAGNOSIS — R4182 Altered mental status, unspecified: Secondary | ICD-10-CM | POA: Insufficient documentation

## 2020-10-22 DIAGNOSIS — E113493 Type 2 diabetes mellitus with severe nonproliferative diabetic retinopathy without macular edema, bilateral: Secondary | ICD-10-CM | POA: Diagnosis present

## 2020-10-22 DIAGNOSIS — L97923 Non-pressure chronic ulcer of unspecified part of left lower leg with necrosis of muscle: Secondary | ICD-10-CM | POA: Diagnosis present

## 2020-10-22 DIAGNOSIS — I5033 Acute on chronic diastolic (congestive) heart failure: Secondary | ICD-10-CM | POA: Diagnosis present

## 2020-10-22 DIAGNOSIS — G9349 Other encephalopathy: Secondary | ICD-10-CM | POA: Diagnosis present

## 2020-10-22 DIAGNOSIS — G9340 Encephalopathy, unspecified: Secondary | ICD-10-CM | POA: Diagnosis not present

## 2020-10-22 DIAGNOSIS — I130 Hypertensive heart and chronic kidney disease with heart failure and stage 1 through stage 4 chronic kidney disease, or unspecified chronic kidney disease: Secondary | ICD-10-CM | POA: Diagnosis present

## 2020-10-22 DIAGNOSIS — R339 Retention of urine, unspecified: Secondary | ICD-10-CM | POA: Diagnosis not present

## 2020-10-22 DIAGNOSIS — N390 Urinary tract infection, site not specified: Secondary | ICD-10-CM | POA: Diagnosis present

## 2020-10-22 DIAGNOSIS — F0390 Unspecified dementia without behavioral disturbance: Secondary | ICD-10-CM | POA: Diagnosis present

## 2020-10-22 DIAGNOSIS — D631 Anemia in chronic kidney disease: Secondary | ICD-10-CM | POA: Diagnosis present

## 2020-10-22 DIAGNOSIS — F05 Delirium due to known physiological condition: Secondary | ICD-10-CM | POA: Diagnosis not present

## 2020-10-22 DIAGNOSIS — N1832 Chronic kidney disease, stage 3b: Secondary | ICD-10-CM | POA: Diagnosis not present

## 2020-10-22 DIAGNOSIS — I10 Essential (primary) hypertension: Secondary | ICD-10-CM | POA: Diagnosis not present

## 2020-10-22 DIAGNOSIS — E1140 Type 2 diabetes mellitus with diabetic neuropathy, unspecified: Secondary | ICD-10-CM | POA: Diagnosis not present

## 2020-10-22 DIAGNOSIS — G4700 Insomnia, unspecified: Secondary | ICD-10-CM | POA: Diagnosis not present

## 2020-10-22 DIAGNOSIS — I96 Gangrene, not elsewhere classified: Secondary | ICD-10-CM | POA: Diagnosis not present

## 2020-10-22 DIAGNOSIS — Z20822 Contact with and (suspected) exposure to covid-19: Secondary | ICD-10-CM | POA: Diagnosis present

## 2020-10-22 DIAGNOSIS — E538 Deficiency of other specified B group vitamins: Secondary | ICD-10-CM | POA: Diagnosis not present

## 2020-10-22 DIAGNOSIS — D539 Nutritional anemia, unspecified: Secondary | ICD-10-CM | POA: Diagnosis present

## 2020-10-22 DIAGNOSIS — I5032 Chronic diastolic (congestive) heart failure: Secondary | ICD-10-CM | POA: Diagnosis not present

## 2020-10-22 DIAGNOSIS — J449 Chronic obstructive pulmonary disease, unspecified: Secondary | ICD-10-CM | POA: Diagnosis not present

## 2020-10-22 DIAGNOSIS — W1830XA Fall on same level, unspecified, initial encounter: Secondary | ICD-10-CM | POA: Diagnosis present

## 2020-10-22 DIAGNOSIS — Z6830 Body mass index (BMI) 30.0-30.9, adult: Secondary | ICD-10-CM | POA: Diagnosis not present

## 2020-10-22 DIAGNOSIS — E43 Unspecified severe protein-calorie malnutrition: Secondary | ICD-10-CM | POA: Diagnosis not present

## 2020-10-22 DIAGNOSIS — I4891 Unspecified atrial fibrillation: Secondary | ICD-10-CM | POA: Diagnosis not present

## 2020-10-22 DIAGNOSIS — I4821 Permanent atrial fibrillation: Secondary | ICD-10-CM | POA: Diagnosis present

## 2020-10-22 DIAGNOSIS — N179 Acute kidney failure, unspecified: Secondary | ICD-10-CM | POA: Diagnosis present

## 2020-10-22 DIAGNOSIS — Z950 Presence of cardiac pacemaker: Secondary | ICD-10-CM | POA: Diagnosis not present

## 2020-10-22 DIAGNOSIS — E039 Hypothyroidism, unspecified: Secondary | ICD-10-CM | POA: Diagnosis not present

## 2020-10-22 DIAGNOSIS — R1312 Dysphagia, oropharyngeal phase: Secondary | ICD-10-CM | POA: Diagnosis not present

## 2020-10-22 DIAGNOSIS — D649 Anemia, unspecified: Secondary | ICD-10-CM | POA: Diagnosis not present

## 2020-10-22 DIAGNOSIS — M7989 Other specified soft tissue disorders: Secondary | ICD-10-CM | POA: Diagnosis not present

## 2020-10-22 DIAGNOSIS — E669 Obesity, unspecified: Secondary | ICD-10-CM | POA: Diagnosis present

## 2020-10-22 DIAGNOSIS — D509 Iron deficiency anemia, unspecified: Secondary | ICD-10-CM | POA: Diagnosis present

## 2020-10-22 DIAGNOSIS — L02416 Cutaneous abscess of left lower limb: Secondary | ICD-10-CM | POA: Diagnosis present

## 2020-10-22 DIAGNOSIS — I442 Atrioventricular block, complete: Secondary | ICD-10-CM | POA: Diagnosis present

## 2020-10-22 DIAGNOSIS — E1152 Type 2 diabetes mellitus with diabetic peripheral angiopathy with gangrene: Secondary | ICD-10-CM | POA: Diagnosis present

## 2020-10-22 DIAGNOSIS — N183 Chronic kidney disease, stage 3 unspecified: Secondary | ICD-10-CM | POA: Diagnosis not present

## 2020-10-22 DIAGNOSIS — R1310 Dysphagia, unspecified: Secondary | ICD-10-CM | POA: Diagnosis not present

## 2020-10-22 DIAGNOSIS — M79605 Pain in left leg: Secondary | ICD-10-CM

## 2020-10-22 DIAGNOSIS — M6281 Muscle weakness (generalized): Secondary | ICD-10-CM | POA: Diagnosis not present

## 2020-10-22 DIAGNOSIS — E13319 Other specified diabetes mellitus with unspecified diabetic retinopathy without macular edema: Secondary | ICD-10-CM | POA: Diagnosis not present

## 2020-10-22 DIAGNOSIS — E890 Postprocedural hypothyroidism: Secondary | ICD-10-CM | POA: Diagnosis present

## 2020-10-22 DIAGNOSIS — R262 Difficulty in walking, not elsewhere classified: Secondary | ICD-10-CM | POA: Diagnosis not present

## 2020-10-22 DIAGNOSIS — E1122 Type 2 diabetes mellitus with diabetic chronic kidney disease: Secondary | ICD-10-CM | POA: Diagnosis present

## 2020-10-22 DIAGNOSIS — L97925 Non-pressure chronic ulcer of unspecified part of left lower leg with muscle involvement without evidence of necrosis: Secondary | ICD-10-CM | POA: Diagnosis not present

## 2020-10-22 DIAGNOSIS — E785 Hyperlipidemia, unspecified: Secondary | ICD-10-CM | POA: Diagnosis not present

## 2020-10-22 DIAGNOSIS — K219 Gastro-esophageal reflux disease without esophagitis: Secondary | ICD-10-CM | POA: Diagnosis not present

## 2020-10-22 LAB — URINALYSIS, COMPLETE (UACMP) WITH MICROSCOPIC
Bilirubin Urine: NEGATIVE
Glucose, UA: 50 mg/dL — AB
Ketones, ur: 20 mg/dL — AB
Nitrite: NEGATIVE
Protein, ur: 100 mg/dL — AB
Specific Gravity, Urine: 1.019 (ref 1.005–1.030)
WBC, UA: >50 WBC/hpf — ABNORMAL HIGH (ref 0–5)
pH: 5 (ref 5.0–8.0)

## 2020-10-22 LAB — RAPID URINE DRUG SCREEN, HOSP PERFORMED
Amphetamines: NOT DETECTED
Barbiturates: NOT DETECTED
Benzodiazepines: NOT DETECTED
Cocaine: NOT DETECTED
Opiates: NOT DETECTED
Tetrahydrocannabinol: NOT DETECTED

## 2020-10-22 LAB — RESP PANEL BY RT-PCR (FLU A&B, COVID) ARPGX2
Influenza A by PCR: NEGATIVE
Influenza B by PCR: NEGATIVE
SARS Coronavirus 2 by RT PCR: NEGATIVE

## 2020-10-22 LAB — TROPONIN I (HIGH SENSITIVITY)
Troponin I (High Sensitivity): 18 ng/L — ABNORMAL HIGH (ref <18)
Troponin I (High Sensitivity): 18 ng/L — ABNORMAL HIGH (ref <18)

## 2020-10-22 LAB — CBG MONITORING, ED
Glucose-Capillary: 170 mg/dL — ABNORMAL HIGH (ref 70–99)
Glucose-Capillary: 167 mg/dL — ABNORMAL HIGH (ref 70–99)
Glucose-Capillary: 156 mg/dL — ABNORMAL HIGH (ref 70–99)
Glucose-Capillary: 181 mg/dL — ABNORMAL HIGH (ref 70–99)

## 2020-10-22 LAB — POC OCCULT BLOOD, ED: Fecal Occult Bld: NEGATIVE

## 2020-10-22 LAB — LACTIC ACID, PLASMA
Lactic Acid, Venous: 1.4 mmol/L (ref 0.5–1.9)
Lactic Acid, Venous: 1.3 mmol/L (ref 0.5–1.9)

## 2020-10-22 MED ORDER — PANTOPRAZOLE SODIUM 40 MG PO TBEC
40.0000 mg | DELAYED_RELEASE_TABLET | Freq: Every day | ORAL | Status: DC
  Administered 2020-10-23 – 2020-10-30 (×8): 40 mg via ORAL
  Filled 2020-10-22 (×9): qty 1

## 2020-10-22 MED ORDER — FLUCONAZOLE 200 MG PO TABS
200.0000 mg | ORAL_TABLET | Freq: Once | ORAL | Status: AC
  Administered 2020-10-22: 200 mg via ORAL
  Filled 2020-10-22: qty 1

## 2020-10-22 MED ORDER — SODIUM CHLORIDE 0.9 % IV SOLN
2.0000 g | Freq: Once | INTRAVENOUS | Status: AC
  Administered 2020-10-22: 2 g via INTRAVENOUS
  Filled 2020-10-22: qty 2

## 2020-10-22 MED ORDER — FLUCONAZOLE 150 MG PO TABS: 150.0000 mg | ORAL_TABLET | Freq: Once | ORAL | Status: DC

## 2020-10-22 MED ORDER — FUROSEMIDE 10 MG/ML IJ SOLN
40.0000 mg | Freq: Once | INTRAMUSCULAR | Status: AC
  Administered 2020-10-22: 40 mg via INTRAVENOUS
  Filled 2020-10-22: qty 4

## 2020-10-22 MED ORDER — VANCOMYCIN HCL 2000 MG/400ML IV SOLN
2000.0000 mg | Freq: Once | INTRAVENOUS | Status: AC
  Administered 2020-10-22: 2000 mg via INTRAVENOUS
  Filled 2020-10-22: qty 400

## 2020-10-22 MED ORDER — VANCOMYCIN VARIABLE DOSE PER UNSTABLE RENAL FUNCTION (PHARMACIST DOSING): Status: DC

## 2020-10-22 MED ORDER — LEVOTHYROXINE SODIUM 75 MCG PO TABS
175.0000 ug | ORAL_TABLET | Freq: Every day | ORAL | Status: DC
  Administered 2020-10-23 – 2020-11-10 (×16): 175 ug via ORAL
  Filled 2020-10-22 (×16): qty 1

## 2020-10-22 MED ORDER — INSULIN ASPART 100 UNIT/ML IJ SOLN
0.0000 [IU] | Freq: Three times a day (TID) | INTRAMUSCULAR | Status: DC
  Administered 2020-10-22 – 2020-10-23 (×3): 3 [IU] via SUBCUTANEOUS
  Administered 2020-10-23: 2 [IU] via SUBCUTANEOUS
  Administered 2020-10-24 (×2): 3 [IU] via SUBCUTANEOUS
  Administered 2020-10-24: 5 [IU] via SUBCUTANEOUS
  Administered 2020-10-25 (×3): 3 [IU] via SUBCUTANEOUS
  Administered 2020-10-26: 2 [IU] via SUBCUTANEOUS
  Administered 2020-10-26 – 2020-10-27 (×4): 3 [IU] via SUBCUTANEOUS
  Administered 2020-10-27 – 2020-10-28 (×2): 5 [IU] via SUBCUTANEOUS
  Administered 2020-10-29 – 2020-11-02 (×7): 2 [IU] via SUBCUTANEOUS
  Administered 2020-11-03: 5 [IU] via SUBCUTANEOUS
  Administered 2020-11-03: 3 [IU] via SUBCUTANEOUS
  Administered 2020-11-04: 8 [IU] via SUBCUTANEOUS
  Administered 2020-11-04 (×2): 2 [IU] via SUBCUTANEOUS
  Administered 2020-11-05 (×3): 3 [IU] via SUBCUTANEOUS
  Administered 2020-11-06: 5 [IU] via SUBCUTANEOUS
  Administered 2020-11-06: 3 [IU] via SUBCUTANEOUS
  Administered 2020-11-07: 8 [IU] via SUBCUTANEOUS
  Administered 2020-11-08: 3 [IU] via SUBCUTANEOUS
  Administered 2020-11-08: 1 [IU] via SUBCUTANEOUS
  Administered 2020-11-09 (×2): 3 [IU] via SUBCUTANEOUS
  Administered 2020-11-10 (×2): 2 [IU] via SUBCUTANEOUS

## 2020-10-22 MED ORDER — INSULIN DETEMIR 100 UNIT/ML ~~LOC~~ SOLN
5.0000 [IU] | Freq: Every day | SUBCUTANEOUS | Status: DC
  Administered 2020-10-22 – 2020-11-09 (×19): 5 [IU] via SUBCUTANEOUS
  Filled 2020-10-22 (×20): qty 0.05

## 2020-10-22 MED ORDER — LABETALOL HCL 5 MG/ML IV SOLN
10.0000 mg | Freq: Once | INTRAVENOUS | Status: AC
  Administered 2020-10-22: 10 mg via INTRAVENOUS
  Filled 2020-10-22: qty 4

## 2020-10-22 MED ORDER — SODIUM CHLORIDE 0.9% FLUSH
3.0000 mL | Freq: Two times a day (BID) | INTRAVENOUS | Status: DC
  Administered 2020-10-22 – 2020-11-09 (×37): 3 mL via INTRAVENOUS

## 2020-10-22 MED ORDER — SODIUM CHLORIDE 0.9 % IV SOLN
2.0000 g | Freq: Two times a day (BID) | INTRAVENOUS | Status: DC
  Administered 2020-10-22 – 2020-10-25 (×6): 2 g via INTRAVENOUS
  Filled 2020-10-22 (×6): qty 2

## 2020-10-22 MED ORDER — LACTATED RINGERS IV BOLUS
500.0000 mL | Freq: Once | INTRAVENOUS | Status: AC
  Administered 2020-10-22: 500 mL via INTRAVENOUS

## 2020-10-22 MED ORDER — GABAPENTIN 300 MG PO CAPS
300.0000 mg | ORAL_CAPSULE | Freq: Two times a day (BID) | ORAL | Status: DC
  Administered 2020-10-22 – 2020-10-29 (×15): 300 mg via ORAL
  Filled 2020-10-22 (×16): qty 1

## 2020-10-22 MED ORDER — ONDANSETRON HCL 4 MG PO TABS: 4.0000 mg | ORAL_TABLET | Freq: Four times a day (QID) | ORAL | Status: DC | PRN

## 2020-10-22 MED ORDER — AMIODARONE HCL 200 MG PO TABS
200.0000 mg | ORAL_TABLET | Freq: Every day | ORAL | Status: DC
  Administered 2020-10-22 – 2020-11-10 (×16): 200 mg via ORAL
  Filled 2020-10-22 (×19): qty 1

## 2020-10-22 MED ORDER — ACETAMINOPHEN 650 MG RE SUPP: 650.0000 mg | Freq: Four times a day (QID) | RECTAL | Status: DC | PRN

## 2020-10-22 MED ORDER — TAMSULOSIN HCL 0.4 MG PO CAPS
0.8000 mg | ORAL_CAPSULE | Freq: Every day | ORAL | Status: DC
  Administered 2020-10-22 – 2020-11-09 (×16): 0.8 mg via ORAL
  Filled 2020-10-22 (×16): qty 2

## 2020-10-22 MED ORDER — SIMVASTATIN 20 MG PO TABS
40.0000 mg | ORAL_TABLET | Freq: Every day | ORAL | Status: DC
  Administered 2020-10-22 – 2020-10-29 (×8): 40 mg via ORAL
  Filled 2020-10-22 (×8): qty 2

## 2020-10-22 MED ORDER — NITROGLYCERIN 2 % TD OINT
1.0000 [in_us] | TOPICAL_OINTMENT | Freq: Once | TRANSDERMAL | Status: AC
  Administered 2020-10-22: 1 [in_us] via TOPICAL
  Filled 2020-10-22: qty 1

## 2020-10-22 MED ORDER — ACETAMINOPHEN 325 MG PO TABS: 650.0000 mg | ORAL_TABLET | Freq: Four times a day (QID) | ORAL | Status: DC | PRN

## 2020-10-22 MED ORDER — ONDANSETRON HCL 4 MG/2ML IJ SOLN: 4.0000 mg | Freq: Four times a day (QID) | INTRAMUSCULAR | Status: DC | PRN

## 2020-10-22 NOTE — H&P (Signed)
Date: 10/22/2020               Patient Name:  [name] MRN: [id number]  DOB: 02/27/1945 Age / Sex: 75 y.o., male   PCP: Dettinger, Fransisca Kaufmann, MD         Medical Service: Internal Medicine Teaching Service         Attending Physician: Dr. Aldine Contes, MD    First Contact: Merrily Brittle, DO Pager: Durene Cal [phone]  Second Contact: Hadassah Pais, MD Pager: PB [phone]       After Hours (After 5p/  First Contact Pager: [phone]  weekends / holidays): Second Contact Pager: [phone]   SUBJECTIVE  Chief Complaint: AMS, left leg pain  History of Present Illness: [name] is a 75 y.o. male with a pertinent PMH of persistent atrial fibrillation (on Eliquis), complete heart block s/p PPM, grade II diastolic heart failure, hypertension, hypothyroidism, diabetes mellitus, CKD IIIb, , who presents to [facility] with altered mental status. History obtained by chart review and patient.   [name] was found sitting in his car for approximately 11 hours [place] of the [place].  Passerby called police.  Patient was noted to be altered on the scene and did not know how he got there but suspected that his car ran out of gas. EMS noted patient to have urinary incontinence on scene. Patient does not recall the events leading up to his hospital presentation.  He does endorse having left leg pain. He notes that this has been there for a while after his fall several weeks ago.  Patient initially presented on 10/10/20 with a left leg injury after mechanical fall.  Time, patient noted to have skin tag abrasion to left distal leg with surrounding blisters.  X-ray ordered that was negative for evidence of fracture or foreign bodies. The wound was irrigated and patient was discharged on doxycycline. He subsequently presented again on 10/16/2020 for leg swelling and oozing from wound.  Repeat x-ray unchanged from prior.  Swelling was suspect to be secondary to hematoma.  Patient given 1 g of Rocephin and  prescription for Keflex.  He reports that he has been taking the antibiotics. He denies any fever/chills, headaches, lightheadedness or dizziness, chest pain, shortness of breath, focal weakness, urinary symptoms, abdominal pain, nausea or vomiting.  Medications: No current facility-administered medications on file prior to encounter.   Current Outpatient Medications on File Prior to Encounter  Medication Sig Dispense Refill   acetaminophen (TYLENOL) 500 MG tablet Take 1,000 mg by mouth every 6 (six) hours as needed (pain).     amiodarone (PACERONE) 200 MG tablet Take 200 mg by mouth daily.     apixaban (ELIQUIS) 5 MG TABS tablet Take 1 tablet (5 mg total) by mouth 2 (two) times daily. 60 tablet 3   bacitracin ointment Apply 1 application topically 2 (two) times daily. Start applying antibiotic ointment starting tomorrow 14 g 0   cephALEXin (KEFLEX) 500 MG capsule Take 1 capsule (500 mg total) by mouth 4 (four) times daily for 7 days. 28 capsule 0   cholecalciferol (VITAMIN D) 1000 units tablet Take 1,000 Units by mouth daily.      dexamethasone (DECADRON) 6 MG tablet Take by mouth.     furosemide (LASIX) 20 MG tablet Take by mouth.     gabapentin (NEURONTIN) 300 MG capsule Take 1 capsule (300 mg total) by mouth 2 (two) times daily. 180 capsule 3   glucose blood test strip 1  strip by Does not apply route 3 (three) times daily. Uses true metrix strips (diabetic club)  12   guaiFENesin (MUCINEX) 600 MG 12 hr tablet Take by mouth.     hydrocortisone cream 1 % Apply 1 application topically 2 (two) times daily. 30 g 0   insulin detemir (LEVEMIR FLEXTOUCH) 100 UNIT/ML FlexPen Inject 5 Units into the skin daily.     levothyroxine (SYNTHROID) 175 MCG tablet Take 1 tablet (175 mcg total) by mouth daily before breakfast. 90 tablet 3   loperamide (IMODIUM A-D) 2 MG tablet Take 1 tablet (2 mg total) by mouth 4 (four) times daily as needed for diarrhea or loose stools. 30 tablet 0   metFORMIN (GLUCOPHAGE)  1000 MG tablet TAKE 1 TABLET BY MOUTH TWICE DAILY WITH A MEAL. (Patient taking differently: Take 1,000 mg by mouth in the morning and at bedtime.) 180 tablet 0   pantoprazole (PROTONIX) 40 MG tablet Take 1 tablet (40 mg total) by mouth daily before breakfast. 30 tablet 0   predniSONE (DELTASONE) 20 MG tablet 2 po at same time daily for 5 days 10 tablet 0   Semaglutide, 1 MG/DOSE, (OZEMPIC, 1 MG/DOSE,) 2 MG/1.5ML SOPN Inject 1 mg into the skin once a week. 4.5 mL 3   simvastatin (ZOCOR) 20 MG tablet SMARTSIG:1 Tablet(s) By Mouth Every Evening     simvastatin (ZOCOR) 80 MG tablet Take 0.5 tablets (40 mg total) by mouth at bedtime. 15 tablet 0   tamsulosin (FLOMAX) 0.4 MG CAPS capsule TAKE TWO CAPSULES BY MOUTH AT BEDTIME 180 capsule 0   vitamin B-12 (CYANOCOBALAMIN) 500 MCG tablet take one tablet BY MOUTH DAILY      Past Medical History:  Past Medical History:  Diagnosis Date   Arthritis    Atrial fibrillation (HCC)    BPH (benign prostatic hypertrophy) 04/15/2010   Cataract    Colon polyps    DEPRESSION 01/22/2009   Heart valve replaced    HYPERCHOLESTEROLEMIA 01/22/2009   HYPERTENSION 01/22/2009   Dr. 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  He reports using a cane to assist with ambulation.  His brother lives in [place] however they are not in contact with each other. He reports a remote history of tobacco use and alcohol use that he describes as "many moons ago". He denies illicit drug use.   Family History: Family History  Problem Relation Age of Onset   Heart disease Father    Congestive Heart Failure Father    Arthritis Father    Diabetes Other     Benign prostatic hyperplasia Brother    Colon cancer Neg Hx    Colon polyps Neg Hx     Allergies: Allergies as of 10/21/2020 - Review Complete 10/21/2020  Allergen Reaction Noted   Phenothiazines Anaphylaxis 04/15/2010   Actos [pioglitazone] Swelling 04/03/2009    Review of Systems: A complete ROS was negative except as per HPI.   OBJECTIVE:  Physical Exam: Blood pressure 134/74, pulse 61, temperature 97.9 F (36.6 C), temperature source Oral, resp. rate 17, SpO2 99 %. Physical Exam Vitals and nursing note reviewed.  Constitutional:  General: He is not in acute distress.    Appearance: He is obese. He is ill-appearing. He is not diaphoretic.  HENT:     Head: Normocephalic and atraumatic.     Mouth/Throat:     Mouth: Mucous membranes are moist.     Pharynx: Oropharynx is clear.  Eyes:     General: No scleral icterus.    Extraocular Movements: Extraocular movements intact.     Conjunctiva/sclera: Conjunctivae normal.     Pupils: Pupils are equal, round, and reactive to light.     Comments: Pupils pinpoint but reactive  Cardiovascular:     Rate and Rhythm: Normal rate. Rhythm irregular.     Pulses: Normal pulses.     Heart sounds: Normal heart sounds. No murmur heard.   No friction rub. No gallop.  Pulmonary:     Effort: Pulmonary effort is normal. No respiratory distress.     Breath sounds: Normal breath sounds. No rales.  Abdominal:     General: Bowel sounds are normal.     Palpations: Abdomen is soft.     Tenderness: There is no abdominal tenderness. There is no guarding or rebound.  Musculoskeletal:        General: Swelling, tenderness and signs of injury present. Normal range of motion.     Cervical back: Normal range of motion and neck supple.     Comments: 2+ pitting edema to bilateral lower extremities  Necrotic lesion on LLE with surrounding warmth and erythema   Skin:    General: Skin is warm and dry.     Capillary Refill: Capillary refill takes less  than 2 seconds.     Findings: Lesion present.  Neurological:     General: No focal deficit present.     Comments: Mental Status: Patient is awake, alert, oriented to self and location but reports year as 2028   No signs of aphasia or neglect Cranial Nerves: II: Pupils equal, round, and reactive to light.   III,IV, VI: EOMI without ptosis or diploplia.  V: Facial sensation is symmetric to light touch VII: Facial movement is symmetric.  VIII: hearing is intact to voice X: Uvula elevates symmetrically XI: Shoulder shrug is symmetric. XII: tongue is midline without atrophy or fasciculations.  Motor: 5/5 strength in BLE and BUE; spontaneously moving all extremities  Sensory: Sensation is grossly intact  bilateral UEs & LEs    Pertinent Labs: CBC    Component Value Date/Time   WBC 7.6 10/21/2020 2028   RBC 2.34 (L) 10/21/2020 2028   HGB 7.8 (L) 10/21/2020 2028   HGB 9.8 (L) 05/14/2020 1145   HCT 24.8 (L) 10/21/2020 2028   HCT 29.9 (L) 05/14/2020 1145   PLT 197 10/21/2020 2028   PLT 163 05/14/2020 1145   MCV 106.0 (H) 10/21/2020 2028   MCV 102 (H) 05/14/2020 1145   MCH 33.3 10/21/2020 2028   MCHC 31.5 10/21/2020 2028   RDW 21.3 (H) 10/21/2020 2028   RDW 16.0 (H) 05/14/2020 1145   LYMPHSABS 1.0 10/21/2020 2028   LYMPHSABS 1.2 05/14/2020 1145   MONOABS 0.8 10/21/2020 2028   EOSABS 0.1 10/21/2020 2028   EOSABS 0.2 05/14/2020 1145   BASOSABS 0.1 10/21/2020 2028   BASOSABS 0.1 05/14/2020 1145     CMP     Component Value Date/Time   NA 139 10/21/2020 2028   NA 135 05/14/2020 1145   K 3.6 10/21/2020 2028   CL 106 10/21/2020 2028   CO2 23 10/21/2020 2028   GLUCOSE  212 (H) 10/21/2020 2028   BUN 16 10/21/2020 2028   BUN 21 05/14/2020 1145   CREATININE 1.55 (H) 10/21/2020 2028   CREATININE 1.16 08/02/2012 1117   CALCIUM 8.7 (L) 10/21/2020 2028   PROT 6.5 10/21/2020 2028   PROT 7.0 03/19/2020 1133   ALBUMIN 3.1 (L) 10/21/2020 2028   ALBUMIN 3.7 03/19/2020 1133   AST 21  10/21/2020 2028   ALT 14 10/21/2020 2028   ALKPHOS 63 10/21/2020 2028   BILITOT 1.6 (H) 10/21/2020 2028   BILITOT 0.3 03/19/2020 1133   GFRNONAA 46 (L) 10/21/2020 2028   GFRNONAA 64 08/02/2012 1117   GFRAA 42 (L) 03/19/2020 1133   GFRAA 74 08/02/2012 1117    Pertinent Imaging: CT HEAD WO CONTRAST  Result Date: 10/21/2020 CLINICAL DATA:  Mental status change, unknown cause. Pt comes via [facility], pt has been sitting in his car for the past 11 hours outside of work because he ran out of gas, does not know how he got there. Pt hypertensive with EMS, Pt AxO x 3 EXAM: CT HEAD WITHOUT CONTRAST TECHNIQUE: Contiguous axial images were obtained from the base of the skull through the vertex without intravenous contrast. COMPARISON:  CT head 10/10/2020 BRAIN: BRAIN Cerebral ventricle sizes are concordant with the degree of cerebral volume loss. Patchy and confluent areas of decreased attenuation are noted throughout the deep and periventricular white matter of the cerebral hemispheres bilaterally, compatible with chronic microvascular ischemic disease. No evidence of large-territorial acute infarction. No parenchymal hemorrhage. Similar-appearing posterior fossa cystic lesion with mass effect on the cerebellum. Otherwise no mass lesion. No extra-axial collection. Otherwise no mass effect or midline shift. No hydrocephalus. Basilar cisterns are patent. Vascular: No hyperdense vessel. Atherosclerotic calcifications are present within the cavernous internal carotid arteries. Skull: No acute fracture or focal lesion. Sinuses/Orbits: Sphenoid sinus mucosal thickening. Paranasal sinuses and mastoid air cells are clear. Bilateral lens replacement. Otherwise the orbits are unremarkable. Other: None. IMPRESSION: No acute intracranial abnormality. Electronically Signed   By: Iven Finn M.D.   On: 10/21/2020 21:16    EKG: personally reviewed my interpretation is unchanged from previous tracings, ventricular paced  rhythm; HR 61; QTc 545  ASSESSMENT & PLAN:  Assessment: Active Problems:   AMS (altered mental status)   TRAVAUGHN VUE is a 75 y.o. with pertinent PMH of complete heart block s/p PPM, HFpEF (Grade II DD), hypertension, hyperlipidemia, diabetes mellitus, permanent atrial fibrillation on Eliquis and CKD3b who presented with altered mental status and admit for acute encephalopathy on hospital day 0  Plan: #Acute encephalopathy #Suspected LLE osteomyelitis Patient presented with altered mental status after being found in his car for >10 hours. He is alert to self and location but not situation or year. He is afebrile and otherwise hemodynamically stable. Patient noted to have a necrotic left lower extremity wound concerning for possible osteomyelitis. CBC without leukocytosis. CMP with mild hyperbilirubinemia and elevated sCr; however, no other significant electrolyte imbalances. CXR without signs of infection. CT Head negative for acute abnormality. UDS negative. Patient does have a significant left lower extremity necrotic wound concerning for possible infection with overlying warmth and erythema. Prior x-rays of LLE without evidence of infection. Patient given one dose of vancomycin and cefepime in the ED.  - MRI LLE w and wo contrast  - IV vancomycin and IV cefepime - F/u blood cultures - Trend CBC  #Hx of ESBL Klebsiella UTI Patient presented with altered mental status and was noted to have urinary incontinence with  EMS. Urinalysis consistent with urinary tract infection with leukocytes but negative for nitrites. Noted to have budding yeast on UA for which he was given one dose of fluconazole in the ED. Prior urine cultures with multi-drug resistant Klebsiella. He denies any dysuria or urinary incontinence and does not have any suprapubic tenderness on exam. Possible that patient may be colonized vs actual UTI. He did receive one dose of fluconazole in the ED. However, without neutropenia or  planned urologic procedures, will hold off on treatment of asymptomatic candiduria.  - F/u urine and blood cultures  #Acute on chronic macrocytic anemia  Patient with history of macrocytic anemia with baseline Hb ~9. Noted to have Hb down to 7.7 approximately one week prior and is stable at 7.8 on this presentation. Denies any melena or hematochezia. FOBT negative. No signs of active bleed at this time.  - Trend CBC - Folate and vitamin B12 levels   #HFpEF (Grade II DD) Patient has a history of grade II diastolic heart failure. He is on lasix 20mg  daily; however, unclear if he takes this. He has 2+ pitting edema of bilateral lower extremities. Unable to appreciate JVD due to body habitus. Patient would benefit from one dose of IV lasix to help with diuresis. - IV Lasix 40mg   - Strict I&O, daily weights - Trend renal function    #CKDIII Patient with history of CKD III with baseline ~1.0-1.2 5 months prior. Noted to have sCr up to 1.55 on presentation. CK wnl. Suspect may be in setting of dehydration as patient was noted to be in his car for >10 hours before presenting. He does appear volume overload though. Patient given small bolus of LR 500cc/hr for MRI contrast. However, would benefit from diuresis.  - Trend renal function - Avoid nephrotoxic agents   #Type II Diabetes Mellitus Most recent HbA1c 8.3. Patient reports taking metformin daily. He is unable to recall other medications. Prior records indicate patient is on GLP-1 agonist and was previously on levemir daily. However, due to prior hypoglycemic events, was recommended to discontinue his daily insulin. CBG 212 on presentation.  - Levemir 5U + SSI - CBG monitoring  - HbA1c   #Persistent atrial fibrillation on Eliquis #Hx of complete heart block s/p PPM [id number] Score - 5 HASBLED Score- 2. Patient previously advised to discontinue Eliquis due to frequent falls; however, has not done so.  -Continue amiodarone - Will hold Eliquis  at this time  #Hypertension #Hyperlipidemia - Continue simvastatin 40mg  daily  #Hypothyroidism - Continue synthroid 159mcg daily - TSH in AM  #Decreased A/G ratio Patient with slightly elevated total bilirubin to 1.6 and decreased A/G ratio of 0.9. Corrected calcium wnl. UA with proteinuria. Patient with history of BPH for which he is on tamsulosin.  - RUQ [name] - Fractionated t.bili  - SPEP/UPEP/IFE  Best Practice: Diet: Cardiac diet and Diabetic diet IVF: Fluids: None, Rate: None VTE: Heparin   Code: Full AB: Vancomycin, Cefepime Status: Inpatient with expected length of stay greater than 2 midnights. Anticipated Discharge Location: SNF Barriers to Discharge: Decreased caregiver support, Medical stability, and IV antibiotics  Signature: Harvie Heck, MD Internal Medicine Resident, PGY-3 [facility]  Pager: [phone] 12:57 PM, 10/22/2020   Please contact the on call pager after 5 pm and on weekends at [phone].

## 2020-10-22 NOTE — Progress Notes (Signed)
Pharmacy Antibiotic Note  [name] is a 75 y.o. male admitted on 10/21/2020 with  wound infection .  Pharmacy has been consulted for vancomycin dosing.  WBC 7.6, afebrile SCr 1.55 - baseline 1-1.2  Plan: Cefepime 2gm IV x1 per MD Give vancomycin 2000 mg IV x1  Variable vancomycin dosing based on renal function Monitor renal function, CBC, cultures/sensitivities, LOT and de-escalate as able  Temp (24hrs), Avg:98 F (36.7 C), Min:97.7 F (36.5 C), Max:98.3 F (36.8 C)  Recent Labs  Lab 10/16/20 1506 10/21/20 2028  WBC 6.0 7.6  CREATININE 1.64* 1.55*  LATICACIDVEN 1.4  --     Estimated Creatinine Clearance: 49.6 mL/min (A) (by C-G formula based on SCr of 1.55 mg/dL (H)).    Allergies  Allergen Reactions   Phenothiazines Anaphylaxis   Actos [Pioglitazone] Swelling    Antimicrobials this admission: Cefepime 9/29 x1 Vancomycin 9/29 >>   Dose adjustments this admission: none  Microbiology results: 9/29 BCx:  9/29 UCx:    Thank you for allowing pharmacy to be a part of this patient's care.  Laurey Arrow, PharmD PGY1 Pharmacy Resident 10/22/2020  9:40 AM  Please check [email or web address] for unit-specific pharmacy phone numbers.

## 2020-10-22 NOTE — ED Provider Notes (Signed)
[facility] EMERGENCY DEPARTMENT Provider Note   CSN: [id number] Arrival date & time: 10/21/20  2001     History Chief Complaint  Patient presents with   Hypertension   Altered Mental Status    [name] is a 75 y.o. male atrial fibrillation (on Eliquis), hypertension, CKD stage III, CHF, pacemaker, type 2 diabetes.  Per chart review patient was brought to emergency department by EMS.  EMS reported patient was found sitting in his car for the past 11 hours, patient was reportedly on the way to see his [facility] when he either ran out of gas or his battery went dead.  Patient was found to have confusion.  Was alert to self, place and month.  Patient denies any complaints at this time.  Patient is found to be alert to person and month.  Patient knows he is in [place] but does not know what building he is in. Patient is unclear what year it is stating it is 2008.  Patient reports that he lives by himself in [place].  Reports that yesterday he was going to his doctor at the [place].  Patient does not have recollection of his car being stranded for multiple hours.   Hypertension  Altered Mental Status     Past Medical History:  Diagnosis Date   Arthritis    Atrial fibrillation (HCC)    BPH (benign prostatic hypertrophy) 04/15/2010   Cataract    Colon polyps    DEPRESSION 01/22/2009   Heart valve replaced    HYPERCHOLESTEROLEMIA 01/22/2009   HYPERTENSION 01/22/2009   Dr. 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) 08/24/2020   Chronic diastolic heart failure ([place]) 08/24/2020   Bilateral lower extremity edema 08/24/2020   C7 cervical fracture ([place]) 08/07/2020   Pacemaker 08/07/2020   Generalized weakness 04/28/2020   Orthostatic hypotension 04/28/2020   Acute cystitis without hematuria    Exudative age-related macular degeneration of right eye with active choroidal neovascularization ([place]) 04/14/2020   Skin tear of left lower leg without complication 67/12/4578   Tinea cruris 04/06/2020   Hospital discharge follow-up 03/13/2020   Ambulatory dysfunction 03/05/2020   Essential tremor 03/05/2020   Ataxia 03/03/2020   Rash 01/03/2020   Lumbar stenosis with neurogenic claudication 11/14/2019   Stage 3b chronic kidney disease ([place]) 09/18/2019   Syncope and collapse 09/16/2019   Severe nonproliferative diabetic retinopathy of both eyes associated with type 2 diabetes mellitus ([place]) 08/27/2019   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 without macular edema ([place]) 08/27/2019   Posterior vitreous detachment of right eye 08/27/2019   Severe nonproliferative diabetic retinopathy of right eye ([place]) 08/27/2019   Frequency of urination and polyuria 01/29/2019   Coronary artery disease involving native coronary artery of native heart without angina pectoris 01/27/2019   Microalbuminuria 12/24/2018   COPD (chronic obstructive pulmonary disease) ([place]) 06/25/2018   Thrombocytopenia ([place]) 05/09/2018  Diabetic neuropathy ([place]) 01/30/2018   S/P AVR (aortic valve replacement) 11/04/2016   Atrial fibrillation ([place]) 09/27/2016   Symptomatic bradycardia 06/12/2016   Urgency incontinence 11/10/2015   Erectile dysfunction 11/10/2015   Acute lower UTI 05/23/2015   Hypothyroidism 01/15/2015   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   Phimosis 01/15/2015   CHF (congestive heart failure) ([place])  01/01/2015   B12 deficiency 04/08/2014   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   Type 2 diabetes mellitus with diabetic neuropathy, unspecified ([place]) 03/12/2014   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   GERD (gastroesophageal reflux disease) 11/02/2012   Dysuria 08/28/2012   Anemia 04/02/2012   Obesity 08/04/2010   Benign prostatic hyperplasia 04/15/2010   CAROTID OCCLUSIVE DISEASE 01/26/2010   MURMUR 05/27/2009   Aortic valve disease 05/27/2009   Hyperlipidemia with target LDL less than 100 01/22/2009   Depression 01/22/2009   Essential hypertension 01/22/2009    Past Surgical History:  Procedure Laterality Date   AORTIC VALVE REPLACEMENT  July 2006   #20 stentless [place] porcine valve   APPENDECTOMY     bilateral cataract surg     BIOPSY  05/01/2020   Procedure: BIOPSY;  Surgeon: Eloise Harman, DO;  Location[facility];  Service: Endoscopy;;   COLONOSCOPY  04/24/2007   [name]: normal   COLONOSCOPY WITH ESOPHAGOGASTRODUODENOSCOPY (EGD) N/A 04/05/2012   normal rectum, somewhat elongated and redundant colon, otherwise normal. Remote history of colon polyps. EGD: normal esophagus, bile-stained gastric mucosa, diffuse patchy gastric erythema, small duodenal bulbar ulcer with associated erosions. Negative H.pylori. H.pylori serology also negative.    COLONOSCOPY WITH PROPOFOL N/A 05/01/2020   Procedure: COLONOSCOPY WITH PROPOFOL;  Surgeon: Eloise Harman, DO;  Location: [facility];  Service: Endoscopy;  Laterality: N/A;   DENTAL SURGERY  05/2004   Dental extractions   ESOPHAGOGASTRODUODENOSCOPY (EGD) WITH PROPOFOL N/A 05/01/2020   Procedure: ESOPHAGOGASTRODUODENOSCOPY (EGD) WITH PROPOFOL;  Surgeon: Eloise Harman, DO;  Location[facility];  Service: Endoscopy;  Laterality: N/A;   EYE SURGERY Bilateral    cataract   HEEL SPUR SURGERY Bilateral    resection of heel spur   KNEE ARTHROSCOPY WITH LATERAL MENISECTOMY Right 04/04/2014   Procedure: KNEE ARTHROSCOPY WITH LATERAL  MENISECTOMY;  Surgeon: Carole Civil, MD;  Location: [facility];  Service: Orthopedics;  Laterality: Right;   PACEMAKER IMPLANT N/A 06/13/2016   Procedure: Pacemaker Implant- Dual Chamber;  Surgeon: Evans Lance, MD;  Location: [facility];  Service: Cardiovascular;  Laterality: N/A;   PACEMAKER INSERTION  2018   THYROIDECTOMY  03/21/2013   DR [name]   THYROIDECTOMY N/A 03/21/2013   Procedure: THYROIDECTOMY;  Surgeon: [name], MD;  Location: [facility];  Service: General;  Laterality: N/A;   TONSILLECTOMY         Family History  Problem Relation Age of Onset   Heart disease Father    Congestive Heart Failure Father    Arthritis Father    Diabetes Other    Benign prostatic hyperplasia Brother    Colon cancer Neg Hx    Colon polyps Neg Hx     Social History   Tobacco Use   Smoking status: Former    Packs/day: 0.00    Years: 12.00    Pack years: 0.00    Types: Cigarettes    Quit date: 09/20/1961    Years since quitting: 59.1   Smokeless tobacco: Never  Vaping Use   Vaping Use: Never used  Substance Use Topics   Alcohol use: Not Currently  Drug use: No    Home Medications Prior to Admission medications   Medication Sig Start Date End Date Taking? Authorizing Provider  acetaminophen (TYLENOL) 500 MG tablet Take 1,000 mg by mouth every 6 (six) hours as needed (pain).    [provider]  amiodarone (PACERONE) 200 MG tablet Take 200 mg by mouth daily. 08/21/20   [provider]  apixaban (ELIQUIS) 5 MG TABS tablet Take 1 tablet (5 mg total) by mouth 2 (two) times daily. 02/17/20   Dettinger, Fransisca Kaufmann, MD  bacitracin ointment Apply 1 application topically 2 (two) times daily. Start applying antibiotic ointment starting tomorrow 10/10/20   Varney Biles, MD  cephALEXin (KEFLEX) 500 MG capsule Take 1 capsule (500 mg total) by mouth 4 (four) times daily for 7 days. 10/16/20 10/23/20  Fransico Meadow, PA-C  cholecalciferol (VITAMIN D) 1000 units tablet  Take 1,000 Units by mouth daily.     [provider]  dexamethasone (DECADRON) 6 MG tablet Take by mouth. 08/27/20   [provider]  furosemide (LASIX) 20 MG tablet Take by mouth.    [provider]  gabapentin (NEURONTIN) 300 MG capsule Take 1 capsule (300 mg total) by mouth 2 (two) times daily. 02/17/20   Dettinger, Fransisca Kaufmann, MD  glucose blood test strip 1 strip by Does not apply route 3 (three) times daily. Uses true metrix strips (diabetic club) 05/12/16   [provider]  guaiFENesin (MUCINEX) 600 MG 12 hr tablet Take by mouth. 08/26/20   [provider]  hydrocortisone cream 1 % Apply 1 application topically 2 (two) times daily. 04/06/20   Ivy Lynn, NP  insulin detemir (LEVEMIR FLEXTOUCH) 100 UNIT/ML FlexPen Inject 5 Units into the skin daily. 05/02/20   Hongalgi, Lenis Dickinson, MD  levothyroxine (SYNTHROID) 175 MCG tablet Take 1 tablet (175 mcg total) by mouth daily before breakfast. 02/17/20   Dettinger, Fransisca Kaufmann, MD  loperamide (IMODIUM A-D) 2 MG tablet Take 1 tablet (2 mg total) by mouth 4 (four) times daily as needed for diarrhea or loose stools. 03/19/20   Dettinger, Fransisca Kaufmann, MD  metFORMIN (GLUCOPHAGE) 1000 MG tablet TAKE 1 TABLET BY MOUTH TWICE DAILY WITH A MEAL. Patient taking differently: Take 1,000 mg by mouth in the morning and at bedtime. 07/24/15   Timmothy Euler, MD  pantoprazole (PROTONIX) 40 MG tablet Take 1 tablet (40 mg total) by mouth daily before breakfast. 05/03/20   Hongalgi, Lenis Dickinson, MD  predniSONE (DELTASONE) 20 MG tablet 2 po at same time daily for 5 days 07/23/20   Dettinger, Fransisca Kaufmann, MD  Semaglutide, 1 MG/DOSE, (OZEMPIC, 1 MG/DOSE,) 2 MG/1.5ML SOPN Inject 1 mg into the skin once a week. 02/17/20   Dettinger, Fransisca Kaufmann, MD  simvastatin (ZOCOR) 20 MG tablet SMARTSIG:1 Tablet(s) By Mouth Every Evening 08/21/20   [provider]  simvastatin (ZOCOR) 80 MG tablet Take 0.5 tablets (40 mg total) by mouth at bedtime. 04/04/17    Timmothy Euler, MD  tamsulosin (FLOMAX) 0.4 MG CAPS capsule TAKE TWO CAPSULES BY MOUTH AT BEDTIME 07/09/20   Dettinger, Fransisca Kaufmann, MD  vitamin B-12 (CYANOCOBALAMIN) 500 MCG tablet take one tablet BY MOUTH DAILY 05/02/20   [provider]    Allergies    Phenothiazines and Actos [pioglitazone]  Review of Systems   Review of Systems  Unable to perform ROS: Mental status change   Physical Exam Updated Vital Signs BP (!) 184/149 (BP Location: Left Arm)   Pulse (!) 59  Temp 97.7 F (36.5 C) (Oral)   Resp 14   SpO2 100%   Physical Exam Vitals and nursing note reviewed. Exam conducted with a chaperone present (Male tech present as chaperone).  Constitutional:      General: He is not in acute distress.    Appearance: He is not ill-appearing, toxic-appearing or diaphoretic.  HENT:     Head: Normocephalic and atraumatic. No raccoon eyes, Battle's sign, abrasion, contusion, masses, right periorbital erythema, left periorbital erythema or laceration.     Jaw: No trismus or pain on movement.     Mouth/Throat:     Pharynx: Oropharynx is clear. Uvula midline. No pharyngeal swelling, oropharyngeal exudate, posterior oropharyngeal erythema or uvula swelling.  Eyes:     General: No scleral icterus.       Right eye: No discharge.        Left eye: No discharge.     Extraocular Movements: Extraocular movements intact.     Conjunctiva/sclera: Conjunctivae normal.     Pupils: Pupils are equal, round, and reactive to light.  Cardiovascular:     Rate and Rhythm: Normal rate.     Pulses:          Dorsalis pedis pulses are 2+ on the right side and 2+ on the left side.  Pulmonary:     Effort: Pulmonary effort is normal. No tachypnea, bradypnea or respiratory distress.     Breath sounds: Normal breath sounds. No stridor.  Abdominal:     General: Abdomen is protuberant. There is no distension. There are no signs of injury.     Palpations: Abdomen is soft. There is no mass or pulsatile  mass.     Tenderness: There is no abdominal tenderness. There is no guarding or rebound.     Hernia: There is no hernia in the umbilical area or ventral area.  Genitourinary:    Rectum: No mass, tenderness, anal fissure, external hemorrhoid or internal hemorrhoid. Normal anal tone.     Comments: Light brown stool noted in rectal vault.  No frank red blood or melena noted. Musculoskeletal:     Cervical back: Normal range of motion and neck supple. No rigidity.     Right lower leg: No deformity, lacerations, tenderness or bony tenderness. 2+ Edema present.     Left lower leg: Laceration present. No deformity, tenderness or bony tenderness. 2+ Edema present.     Comments: Wound to left lower extremity, surrounding erythema and warmth.  No discharge noted.  No midline tenderness or deformity to cervical, thoracic, or lumbar spine.  Feet:     Right foot:     Skin integrity: Callus and dry skin present. No ulcer, blister, skin breakdown, erythema, warmth or fissure.     Toenail Condition: Right toenails are abnormally thick.     Left foot:     Skin integrity: Callus and dry skin present. No ulcer, blister, skin breakdown, erythema, warmth or fissure.     Toenail Condition: Left toenails are abnormally thick.  Skin:    General: Skin is warm and dry.  Neurological:     General: No focal deficit present.     Mental Status: He is alert.     GCS: GCS eye subscore is 4. GCS verbal subscore is 5. GCS motor subscore is 6.     Cranial Nerves: No cranial nerve deficit or facial asymmetry.     Sensory: Sensation is intact.     Motor: Tremor present. No weakness, seizure activity or pronator drift.  Coordination: Romberg sign negative. Finger-Nose-Finger Test normal.     Gait: Gait is intact. Gait normal.     Comments: CN II-XII intact; performed in supine position, +5 strength to bilateral upper extremities, +5 strength to dorsiflexion and plantarflexion, patient able to left both legs against  gravity and hold each there without difficulty, distal to light touch intact to bilateral upper and lower extremities.  Tremor to bilateral upper extremities noted.  Psychiatric:        Behavior: Behavior is cooperative.    ED Results / Procedures / Treatments   Labs (all labs ordered are listed, but only abnormal results are displayed) Labs Reviewed  COMPREHENSIVE METABOLIC PANEL - Abnormal; Notable for the following components:      Result Value   Glucose, Bld 212 (*)    Creatinine, Ser 1.55 (*)    Calcium 8.7 (*)    Albumin 3.1 (*)    Total Bilirubin 1.6 (*)    GFR, Estimated 46 (*)    All other components within normal limits  CBC WITH DIFFERENTIAL/PLATELET - Abnormal; Notable for the following components:   RBC 2.34 (*)    Hemoglobin 7.8 (*)    HCT 24.8 (*)    MCV 106.0 (*)    RDW 21.3 (*)    All other components within normal limits  URINALYSIS, COMPLETE (UACMP) WITH MICROSCOPIC - Abnormal; Notable for the following components:   Color, Urine AMBER (*)    APPearance CLOUDY (*)    Glucose, UA 50 (*)    Hgb urine dipstick SMALL (*)    Ketones, ur 20 (*)    Protein, ur 100 (*)    Leukocytes,Ua LARGE (*)    WBC, UA >50 (*)    Bacteria, UA RARE (*)    Non Squamous Epithelial 0-5 (*)    All other components within normal limits  CBG MONITORING, ED - Abnormal; Notable for the following components:   Glucose-Capillary 170 (*)    All other components within normal limits  URINE CULTURE  RESP PANEL BY RT-PCR (FLU A&B, COVID) ARPGX2  CULTURE, BLOOD (ROUTINE X 2)  CULTURE, BLOOD (ROUTINE X 2)  AMMONIA  ETHANOL  RAPID URINE DRUG SCREEN, HOSP PERFORMED  CK  LACTIC ACID, PLASMA  LACTIC ACID, PLASMA  CBG MONITORING, ED  POC OCCULT BLOOD, ED  TROPONIN I (HIGH SENSITIVITY)    EKG EKG Interpretation  Date/Time:  Wednesday October 21 2020 20:16:36 EDT Ventricular Rate:  61 PR Interval:    QRS Duration: 166 QT Interval:  542 QTC Calculation: 545 R Axis:   65 Text  Interpretation: Ventricular-paced rhythm Abnormal ECG Confirmed by Godfrey Pick ([id number]) on 10/22/2020 8:30:14 AM  Radiology CT HEAD WO CONTRAST  Result Date: 10/21/2020 CLINICAL DATA:  Mental status change, unknown cause. Pt comes via [facility], pt has been sitting in his car for the past 11 hours outside of work because he ran out of gas, does not know how he got there. Pt hypertensive with EMS, Pt AxO x 3 EXAM: CT HEAD WITHOUT CONTRAST TECHNIQUE: Contiguous axial images were obtained from the base of the skull through the vertex without intravenous contrast. COMPARISON:  CT head 10/10/2020 BRAIN: BRAIN Cerebral ventricle sizes are concordant with the degree of cerebral volume loss. Patchy and confluent areas of decreased attenuation are noted throughout the deep and periventricular white matter of the cerebral hemispheres bilaterally, compatible with chronic microvascular ischemic disease. No evidence of large-territorial acute infarction. No parenchymal hemorrhage. Similar-appearing posterior fossa cystic lesion with  mass effect on the cerebellum. Otherwise no mass lesion. No extra-axial collection. Otherwise no mass effect or midline shift. No hydrocephalus. Basilar cisterns are patent. Vascular: No hyperdense vessel. Atherosclerotic calcifications are present within the cavernous internal carotid arteries. Skull: No acute fracture or focal lesion. Sinuses/Orbits: Sphenoid sinus mucosal thickening. Paranasal sinuses and mastoid air cells are clear. Bilateral lens replacement. Otherwise the orbits are unremarkable. Other: None. IMPRESSION: No acute intracranial abnormality. Electronically Signed   By: Iven Finn M.D.   On: 10/21/2020 21:16    Procedures Procedures   Medications Ordered in ED Medications  vancomycin variable dose per unstable renal function (pharmacist dosing) (has no administration in time range)  sodium chloride flush (NS) 0.9 % injection 3 mL (3 mLs Intravenous Given 10/22/20 1117)   acetaminophen (TYLENOL) tablet 650 mg (has no administration in time range)    Or  acetaminophen (TYLENOL) suppository 650 mg (has no administration in time range)  ondansetron (ZOFRAN) tablet 4 mg (has no administration in time range)    Or  ondansetron (ZOFRAN) injection 4 mg (has no administration in time range)  amiodarone (PACERONE) tablet 200 mg (200 mg Oral Given 10/22/20 1158)  gabapentin (NEURONTIN) capsule 300 mg (300 mg Oral Given 10/22/20 1159)  levothyroxine (SYNTHROID) tablet 175 mcg (has no administration in time range)  pantoprazole (PROTONIX) EC tablet 40 mg (has no administration in time range)  simvastatin (ZOCOR) tablet 40 mg (40 mg Oral Given 10/22/20 1709)  tamsulosin (FLOMAX) capsule 0.8 mg (has no administration in time range)  insulin detemir (LEVEMIR) injection 5 Units (has no administration in time range)  insulin aspart (novoLOG) injection 0-15 Units (3 Units Subcutaneous Given 10/22/20 1712)  ceFEPIme (MAXIPIME) 2 g in sodium chloride 0.9 % 100 mL IVPB (has no administration in time range)  labetalol (NORMODYNE) injection 10 mg (10 mg Intravenous Given 10/22/20 0906)  nitroGLYCERIN (NITROGLYN) 2 % ointment 1 inch (1 inch Topical Given 10/22/20 0908)  ceFEPIme (MAXIPIME) 2 g in sodium chloride 0.9 % 100 mL IVPB (0 g Intravenous Stopped 10/22/20 1200)  vancomycin (VANCOREADY) IVPB 2000 mg/400 mL (0 mg Intravenous Stopped 10/22/20 1232)  fluconazole (DIFLUCAN) tablet 200 mg (200 mg Oral Given 10/22/20 1109)  lactated ringers bolus 500 mL (0 mLs Intravenous Stopped 10/22/20 1308)  furosemide (LASIX) injection 40 mg (40 mg Intravenous Given 10/22/20 1611)    ED Course  I have reviewed the triage vital signs and the nursing notes.  Pertinent labs & imaging results that were available during my care of the patient were reviewed by me and considered in my medical decision making (see chart for details).  Clinical Course as of 10/22/20 1713  Thu Oct 22, 2020  0945 Spoke to  hospitalist who agreed to see the patient for admission. [PB]    Clinical Course User Index [PB] Dyann Ruddle   MDM Rules/Calculators/A&P                           Alert 75 year old male no acute distress, nontoxic appearing.  Brought to emergency department by EMS for confusion after he was found sitting in his car for approximately 11 hours.  Upon interview patient is alert to self.  Patient knows he is in [place] but does not know what building he is in.  Patient knows it is September however thinks it is 2008.  Patient noted to be hypertensive.  Patient has history of hypertension, likely has not had his medications.  Will reorder patient's home medications at this time.  Neurologic exam is reassuring.  Patient has no family or friend information and demographics.  He is unable to recall any phone numbers of friends or family to call.  Patient does not have cell phone in his belongings.  Unable to establish clear baseline mental status.  CMP shows creatinine 1.55; consistent with lab results obtained over the last 4 months. CBC shows no leukocytosis.  Patient noted to be anemic with hemoglobin at 7.8 and hematocrit at 24.8.  This is consistent with lab work obtained 6 days prior.  No obvious source of bleeding.  Will obtain Hemoccult test to look for GI source of bleeding.  No frank red blood or melena noted on rectal exam.  Patient noted to be hypertensive.  Patient given labetalol and nitroglycerin paste with improvement in his hypertension.  UTI shows bacteria rare, WBC greater than 50, leukocytes large, yeast present.  Previous urine culture shows Klebsiella which is sensitive to cefepime.  We will start patient on cefepime and fluconazole for urinary tract infection and yeast infection.  Patient wound to left lower leg concerning for worsening infection.  We will start patient on vancomycin.  Suspect patient's UTI and lower leg infection as possible source of  altered mental status.  Will consult hospitalist for admission.  Patient was discussed with and evaluated by Dr. Doren Custard.   Final Clinical Impression(s) / ED Diagnoses Final diagnoses:  Hypertension, unspecified type  Altered mental status, unspecified altered mental status type  Urinary tract infection without hematuria, site unspecified  Hyperbilirubinemia    Rx / DC Orders ED Discharge Orders     None        Dyann Ruddle 10/22/20 1716    Godfrey Pick, MD 10/22/20 [phone]

## 2020-10-23 ENCOUNTER — Inpatient Hospital Stay (HOSPITAL_COMMUNITY): Payer: No Typology Code available for payment source

## 2020-10-23 DIAGNOSIS — D649 Anemia, unspecified: Secondary | ICD-10-CM

## 2020-10-23 DIAGNOSIS — R339 Retention of urine, unspecified: Secondary | ICD-10-CM

## 2020-10-23 LAB — CBC
HCT: 22.8 % — ABNORMAL LOW (ref 39.0–52.0)
Hemoglobin: 7.1 g/dL — ABNORMAL LOW (ref 13.0–17.0)
MCH: 33 pg (ref 26.0–34.0)
MCHC: 31.1 g/dL (ref 30.0–36.0)
MCV: 106 fL — ABNORMAL HIGH (ref 80.0–100.0)
Platelets: 166 10*3/uL (ref 150–400)
RBC: 2.15 MIL/uL — ABNORMAL LOW (ref 4.22–5.81)
RDW: 20.9 % — ABNORMAL HIGH (ref 11.5–15.5)
WBC: 6.7 10*3/uL (ref 4.0–10.5)
nRBC: 0 % (ref 0.0–0.2)

## 2020-10-23 LAB — CBG MONITORING, ED
Glucose-Capillary: 134 mg/dL — ABNORMAL HIGH (ref 70–99)
Glucose-Capillary: 132 mg/dL — ABNORMAL HIGH (ref 70–99)
Glucose-Capillary: 131 mg/dL — ABNORMAL HIGH (ref 70–99)
Glucose-Capillary: 159 mg/dL — ABNORMAL HIGH (ref 70–99)

## 2020-10-23 LAB — COMPREHENSIVE METABOLIC PANEL
ALT: 10 U/L (ref 0–44)
AST: 17 U/L (ref 15–41)
Albumin: 2.7 g/dL — ABNORMAL LOW (ref 3.5–5.0)
Alkaline Phosphatase: 50 U/L (ref 38–126)
Anion gap: 9 (ref 5–15)
BUN: 16 mg/dL (ref 8–23)
CO2: 25 mmol/L (ref 22–32)
Calcium: 8.4 mg/dL — ABNORMAL LOW (ref 8.9–10.3)
Chloride: 106 mmol/L (ref 98–111)
Creatinine, Ser: 1.45 mg/dL — ABNORMAL HIGH (ref 0.61–1.24)
GFR, Estimated: 50 mL/min — ABNORMAL LOW (ref >60)
Glucose, Bld: 156 mg/dL — ABNORMAL HIGH (ref 70–99)
Potassium: 3.5 mmol/L (ref 3.5–5.1)
Sodium: 140 mmol/L (ref 135–145)
Total Bilirubin: 0.6 mg/dL (ref 0.3–1.2)
Total Protein: 5.7 g/dL — ABNORMAL LOW (ref 6.5–8.1)

## 2020-10-23 LAB — VITAMIN B12: Vitamin B-12: 668 pg/mL (ref 180–914)

## 2020-10-23 LAB — HEMOGLOBIN A1C
Hgb A1c MFr Bld: 7.8 % — ABNORMAL HIGH (ref 4.8–5.6)
Mean Plasma Glucose: 177.16 mg/dL

## 2020-10-23 LAB — FOLATE: Folate: 22.5 ng/mL (ref >5.9)

## 2020-10-23 LAB — TSH: TSH: 15.009 u[IU]/mL — ABNORMAL HIGH (ref 0.350–4.500)

## 2020-10-23 MED ORDER — LIDOCAINE-EPINEPHRINE 1 %-1:100000 IJ SOLN
20.0000 mL | Freq: Once | INTRAMUSCULAR | Status: AC
  Administered 2020-10-23: 20 mL via INTRADERMAL
  Filled 2020-10-23: qty 1

## 2020-10-23 MED ORDER — GADOBUTROL 1 MMOL/ML IV SOLN
10.0000 mL | Freq: Once | INTRAVENOUS | Status: AC | PRN
  Administered 2020-10-23: 10 mL via INTRAVENOUS

## 2020-10-23 MED ORDER — ADULT MULTIVITAMIN W/MINERALS CH
1.0000 | ORAL_TABLET | Freq: Every day | ORAL | Status: DC
  Administered 2020-10-23 – 2020-11-10 (×15): 1 via ORAL
  Filled 2020-10-23 (×16): qty 1

## 2020-10-23 MED ORDER — VANCOMYCIN HCL 1500 MG/300ML IV SOLN
1500.0000 mg | INTRAVENOUS | Status: DC
  Administered 2020-10-23: 1500 mg via INTRAVENOUS
  Filled 2020-10-23 (×2): qty 300

## 2020-10-23 MED ORDER — HYDRALAZINE HCL 25 MG PO TABS
25.0000 mg | ORAL_TABLET | Freq: Three times a day (TID) | ORAL | Status: DC
  Administered 2020-10-23 – 2020-11-09 (×43): 25 mg via ORAL
  Filled 2020-10-23 (×45): qty 1

## 2020-10-23 NOTE — Progress Notes (Signed)
Patient from ED 41 to MRI department for MRI tib/fib left w wo contrast. Patient has medtronic device. CLE sent to [facility]. Orders received for VOO 75. Will re-program once scan is completed.

## 2020-10-23 NOTE — Consult Note (Signed)
Reason for Consult:Left lower leg abscess Referring Physician: Aldine Contes Time called: 1610 Time at bedside: [name] is an 75 y.o. male.  HPI: [name] fell off of his porch and cut his leg on 9/17. He has been dealing with it ever since. He's been on at least one round of abx. He was found yesterday altered in his car and had apparently been there for about 11 hours. He was brought to the ED and admitted. MRI today showed a lower leg abscess and orthopedic surgery was consulted. He denies fevers, chills, sweats, N/V.  Past Medical History:  Diagnosis Date   Arthritis    Atrial fibrillation (HCC)    BPH (benign prostatic hypertrophy) 04/15/2010   Cataract    Colon polyps    DEPRESSION 01/22/2009   Heart valve replaced    HYPERCHOLESTEROLEMIA 01/22/2009   HYPERTENSION 01/22/2009   Dr. 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 ([facility]) 08/27/2019   Stage 3b chronic kidney disease ([facility]) 09/18/2019   Tremors of nervous system    ?ptsd   Ulcer    Vitreous hemorrhage of left eye ([facility]) 08/27/2019    Past Surgical History:  Procedure Laterality Date   AORTIC VALVE REPLACEMENT  July 2006   #20 stentless [place] porcine valve   APPENDECTOMY     bilateral cataract surg     BIOPSY  05/01/2020   Procedure: BIOPSY;  Surgeon: Eloise Harman, DO;  [facility];  Service: Endoscopy;;   COLONOSCOPY  04/24/2007   [name]: normal   COLONOSCOPY WITH ESOPHAGOGASTRODUODENOSCOPY (EGD) N/A 04/05/2012   normal rectum, somewhat elongated and redundant colon, otherwise normal. Remote history of colon polyps. EGD: normal esophagus, bile-stained gastric mucosa, diffuse patchy gastric erythema, small duodenal bulbar ulcer with associated erosions. Negative H.pylori. H.pylori serology also negative.    COLONOSCOPY WITH PROPOFOL N/A 05/01/2020   Procedure: COLONOSCOPY WITH  PROPOFOL;  Surgeon: Eloise Harman, DO;  [facility];  Service: Endoscopy;  Laterality: N/A;   DENTAL SURGERY  05/2004   Dental extractions   ESOPHAGOGASTRODUODENOSCOPY (EGD) WITH PROPOFOL N/A 05/01/2020   Procedure: ESOPHAGOGASTRODUODENOSCOPY (EGD) WITH PROPOFOL;  Surgeon: Eloise Harman, DO;  [facility];  Service: Endoscopy;  Laterality: N/A;   EYE SURGERY Bilateral    cataract   HEEL SPUR SURGERY Bilateral    resection of heel spur   KNEE ARTHROSCOPY WITH LATERAL MENISECTOMY Right 04/04/2014   Procedure: KNEE ARTHROSCOPY WITH LATERAL MENISECTOMY;  Surgeon: Carole Civil, MD;  Location: [facility];  Service: Orthopedics;  Laterality: Right;   PACEMAKER IMPLANT N/A 06/13/2016   Procedure: Pacemaker Implant- Dual Chamber;  Surgeon: Evans Lance, MD;  Location: [facility];  Service: Cardiovascular;  Laterality: N/A;   PACEMAKER INSERTION  2018   THYROIDECTOMY  03/21/2013   DR [name]   THYROIDECTOMY N/A 03/21/2013   Procedure: THYROIDECTOMY;  Surgeon: [name], MD;  Location: [facility];  Service: General;  Laterality: N/A;   TONSILLECTOMY      Family History  Problem Relation Age of Onset   Heart disease Father    Congestive Heart Failure Father    Arthritis Father    Diabetes Other    Benign prostatic hyperplasia Brother    Colon cancer Neg Hx    Colon polyps Neg Hx     Social History:  reports that he quit  smoking about 59 years ago. His smoking use included cigarettes. He has never used smokeless tobacco. He reports that he does not currently use alcohol. He reports that he does not use drugs.  Allergies:  Allergies  Allergen Reactions   Phenothiazines Anaphylaxis   Actos [Pioglitazone] Swelling    Medications: I have reviewed the patient's current medications.  Results for orders placed or performed during the hospital encounter of 10/21/20 (from the past 48 hour(s))  Comprehensive metabolic panel     Status: Abnormal   Collection  Time: 10/21/20  8:28 PM  Result Value Ref Range   Sodium 139 135 - 145 mmol/L   Potassium 3.6 3.5 - 5.1 mmol/L   Chloride 106 98 - 111 mmol/L   CO2 23 22 - 32 mmol/L   Glucose, Bld 212 (H) 70 - 99 mg/dL    Comment: Glucose reference range applies only to samples taken after fasting for at least 8 hours.   BUN 16 8 - 23 mg/dL   Creatinine, Ser 1.55 (H) 0.61 - 1.24 mg/dL   Calcium 8.7 (L) 8.9 - 10.3 mg/dL   Total Protein 6.5 6.5 - 8.1 g/dL   Albumin 3.1 (L) 3.5 - 5.0 g/dL   AST 21 15 - 41 U/L   ALT 14 0 - 44 U/L   Alkaline Phosphatase 63 38 - 126 U/L   Total Bilirubin 1.6 (H) 0.3 - 1.2 mg/dL   GFR, Estimated 46 (L) >60 mL/min    Comment: (NOTE) Calculated using the CKD-EPI Creatinine Equation (2021)    Anion gap 10 5 - 15    Comment: Performed at [facility], [place] [address] [place], [place] [address]  CBC WITH DIFFERENTIAL     Status: Abnormal   Collection Time: 10/21/20  8:28 PM  Result Value Ref Range   WBC 7.6 4.0 - 10.5 K/uL   RBC 2.34 (L) 4.22 - 5.81 MIL/uL   Hemoglobin 7.8 (L) 13.0 - 17.0 g/dL   HCT 24.8 (L) 39.0 - 52.0 %   MCV 106.0 (H) 80.0 - 100.0 fL   MCH 33.3 26.0 - 34.0 pg   MCHC 31.5 30.0 - 36.0 g/dL   RDW 21.3 (H) 11.5 - 15.5 %   Platelets 197 150 - 400 K/uL   nRBC 0.0 0.0 - 0.2 %   Neutrophils Relative % 71 %   Neutro Abs 5.5 1.7 - 7.7 K/uL   Lymphocytes Relative 14 %   Lymphs Abs 1.0 0.7 - 4.0 K/uL   Monocytes Relative 11 %   Monocytes Absolute 0.8 0.1 - 1.0 K/uL   Eosinophils Relative 2 %   Eosinophils Absolute 0.1 0.0 - 0.5 K/uL   Basophils Relative 1 %   Basophils Absolute 0.1 0.0 - 0.1 K/uL   Immature Granulocytes 1 %   Abs Immature Granulocytes 0.07 0.00 - 0.07 K/uL    Comment: Performed at [facility] [address] [place], [place] [address]  Ammonia     Status: None   Collection Time: 10/21/20  8:28 PM  Result Value Ref Range   Ammonia 14 9 - 35 umol/L    Comment: Performed at [facility], [place] [address]  [place], [place] [address]  Ethanol     Status: None   Collection Time: 10/21/20  8:28 PM  Result Value Ref Range   Alcohol, Ethyl (B) <10 <10 mg/dL    Comment: (NOTE) Lowest detectable limit for serum alcohol is 10 mg/dL.  For medical purposes  only. Performed at [facility], [place] [address], [place], [place] [address]   CK     Status: None   Collection Time: 10/21/20  8:28 PM  Result Value Ref Range   Total CK 74 49 - 397 U/L    Comment: Performed at [facility], [place] [address], [place], [place] [address]  Urinalysis, Complete w Microscopic Urine, Clean Catch     Status: Abnormal   Collection Time: 10/22/20  8:15 AM  Result Value Ref Range   Color, Urine AMBER (A) YELLOW    Comment: BIOCHEMICALS MAY BE AFFECTED BY COLOR   APPearance CLOUDY (A) CLEAR   Specific Gravity, Urine 1.019 1.005 - 1.030   pH 5.0 5.0 - 8.0   Glucose, UA 50 (A) NEGATIVE mg/dL   Hgb urine dipstick SMALL (A) NEGATIVE   Bilirubin Urine NEGATIVE NEGATIVE   Ketones, ur 20 (A) NEGATIVE mg/dL   Protein, ur 100 (A) NEGATIVE mg/dL   Nitrite NEGATIVE NEGATIVE   Leukocytes,Ua LARGE (A) NEGATIVE   RBC / HPF 11-20 0 - 5 RBC/hpf   WBC, UA >50 (H) 0 - 5 WBC/hpf   Bacteria, UA RARE (A) NONE SEEN   Squamous Epithelial / LPF 6-10 0 - 5   Mucus PRESENT    Budding Yeast PRESENT    Non Squamous Epithelial 0-5 (A) NONE SEEN    Comment: Performed at [facility], [place] [address], [place], [place] [address]  Urine rapid drug screen (hosp performed)     Status: None   Collection Time: 10/22/20  8:15 AM  Result Value Ref Range   Opiates NONE DETECTED NONE DETECTED   Cocaine NONE DETECTED NONE DETECTED   Benzodiazepines NONE DETECTED NONE DETECTED   Amphetamines NONE DETECTED NONE DETECTED   Tetrahydrocannabinol NONE DETECTED NONE DETECTED   Barbiturates NONE DETECTED NONE DETECTED    Comment: (NOTE) DRUG SCREEN FOR MEDICAL PURPOSES ONLY.  IF CONFIRMATION IS NEEDED FOR ANY PURPOSE, NOTIFY LAB WITHIN 5  DAYS.  LOWEST DETECTABLE LIMITS FOR URINE DRUG SCREEN Drug Class                     Cutoff (ng/mL) Amphetamine and metabolites    1000 Barbiturate and metabolites    200 Benzodiazepine                 888 Tricyclics and metabolites     300 Opiates and metabolites        300 Cocaine and metabolites        300 THC                            50 Performed at [facility], [place] [address] [place], [place] [address]   CBG monitoring, ED     Status: Abnormal   Collection Time: 10/22/20  8:18 AM  Result Value Ref Range   Glucose-Capillary 170 (H) 70 - 99 mg/dL    Comment: Glucose reference range applies only to samples taken after fasting for at least 8 hours.   Comment 1 Notify RN    Comment 2 Document in Chart   Troponin I (High Sensitivity)     Status: Abnormal   Collection Time: 10/22/20  9:21 AM  Result Value Ref Range   Troponin I (High Sensitivity) 18 (H) <18 ng/L    Comment: (NOTE) Elevated high sensitivity troponin I (hsTnI) values and significant  changes across serial  measurements may suggest ACS but many other  chronic and acute conditions are known to elevate hsTnI results.  Refer to the "Links" section for chest pain algorithms and additional  guidance. Performed at [facility], [place] [address] [place], [place] [address]   Lactic acid, plasma     Status: None   Collection Time: 10/22/20  9:22 AM  Result Value Ref Range   Lactic Acid, Venous 1.4 0.5 - 1.9 mmol/L    Comment: Performed at [facility] [address] [place], [place] [address]  Urine Culture     Status: Abnormal (Preliminary result)   Collection Time: 10/22/20  9:24 AM   Specimen: Urine, Clean Catch  Result Value Ref Range   Specimen Description URINE, CLEAN CATCH    Special Requests NONE    Culture (A)     60,000 COLONIES/mL YEAST CULTURE REINCUBATED FOR BETTER GROWTH Performed at [facility], [place] [address], [place], [place] [address]    Report Status PENDING   Resp  Panel by RT-PCR (Flu A&B, Covid) Nasopharyngeal Swab     Status: None   Collection Time: 10/22/20  9:33 AM   Specimen: Nasopharyngeal Swab; Nasopharyngeal(NP) swabs in vial transport medium  Result Value Ref Range   SARS Coronavirus 2 by RT PCR NEGATIVE NEGATIVE    Comment: (NOTE) SARS-CoV-2 target nucleic acids are NOT DETECTED.  The SARS-CoV-2 RNA is generally detectable in upper respiratory specimens during the acute phase of infection. The lowest concentration of SARS-CoV-2 viral copies this assay can detect is 138 copies/mL. A negative result does not preclude SARS-Cov-2 infection and should not be used as the sole basis for treatment or other patient management decisions. A negative result may occur with  improper specimen collection/handling, submission of specimen other than nasopharyngeal swab, presence of viral mutation(s) within the areas targeted by this assay, and inadequate number of viral copies(<138 copies/mL). A negative result must be combined with clinical observations, patient history, and epidemiological information. The expected result is Negative.  Fact Sheet for Patients:  EntrepreneurPulse.com.au  Fact Sheet for Healthcare Providers:  IncredibleEmployment.be  This test is no t yet approved or cleared by the [place] FDA and  has been authorized for detection and/or diagnosis of SARS-CoV-2 by FDA under an Emergency Use Authorization (EUA). This EUA will remain  in effect (meaning this test can be used) for the duration of the COVID-19 declaration under Section 564(b)(1) of the Act, 21 U.S.C.section 360bbb-3(b)(1), unless the authorization is terminated  or revoked sooner.       Influenza A by PCR NEGATIVE NEGATIVE   Influenza B by PCR NEGATIVE NEGATIVE    Comment: (NOTE) The Xpert Xpress SARS-CoV-2/FLU/RSV plus assay is intended as an aid in the diagnosis of influenza from Nasopharyngeal swab specimens  and should not be used as a sole basis for treatment. Nasal washings and aspirates are unacceptable for Xpert Xpress SARS-CoV-2/FLU/RSV testing.  Fact Sheet for Patients: EntrepreneurPulse.com.au  Fact Sheet for Healthcare Providers: IncredibleEmployment.be  This test is not yet approved or cleared by the [place] FDA and has been authorized for detection and/or diagnosis of SARS-CoV-2 by FDA under an Emergency Use Authorization (EUA). This EUA will remain in effect (meaning this test can be used) for the duration of the COVID-19 declaration under Section 564(b)(1) of the Act, 21 U.S.C. section 360bbb-3(b)(1), unless the authorization is terminated or revoked.  Performed at [facility], [place] [address] [place], [place] [address]  Blood culture (routine x 2)     Status: None (Preliminary result)   Collection Time: 10/22/20  9:43 AM   Specimen: BLOOD LEFT ARM  Result Value Ref Range   Specimen Description BLOOD LEFT ARM    Special Requests      BOTTLES DRAWN AEROBIC AND ANAEROBIC Blood Culture adequate volume   Culture      NO GROWTH < 24 HOURS Performed at [facility], [place] [address], [place], [place] [address]    Report Status PENDING   POC occult blood, ED Provider will collect     Status: None   Collection Time: 10/22/20  9:59 AM  Result Value Ref Range   Fecal Occult Bld NEGATIVE NEGATIVE  Blood culture (routine x 2)     Status: None (Preliminary result)   Collection Time: 10/22/20 10:40 AM   Specimen: BLOOD RIGHT ARM  Result Value Ref Range   Specimen Description BLOOD RIGHT ARM    Special Requests      BOTTLES DRAWN AEROBIC AND ANAEROBIC Blood Culture adequate volume   Culture      NO GROWTH < 24 HOURS Performed at [facility], [place] [address], [place], [place] [address]    Report Status PENDING   Troponin I (High Sensitivity)     Status: Abnormal   Collection Time: 10/22/20 11:12 AM  Result Value  Ref Range   Troponin I (High Sensitivity) 18 (H) <18 ng/L    Comment: (NOTE) Elevated high sensitivity troponin I (hsTnI) values and significant  changes across serial measurements may suggest ACS but many other  chronic and acute conditions are known to elevate hsTnI results.  Refer to the "Links" section for chest pain algorithms and additional  guidance. Performed at [facility], [place] [address] [place], [place] [address]   Lactic acid, plasma     Status: None   Collection Time: 10/22/20 11:29 AM  Result Value Ref Range   Lactic Acid, Venous 1.3 0.5 - 1.9 mmol/L    Comment: Performed at [facility] [address], [place], [place] [address]  POC CBG, ED     Status: Abnormal   Collection Time: 10/22/20 12:03 PM  Result Value Ref Range   Glucose-Capillary 167 (H) 70 - 99 mg/dL    Comment: Glucose reference range applies only to samples taken after fasting for at least 8 hours.  CBG monitoring, ED     Status: Abnormal   Collection Time: 10/22/20  5:08 PM  Result Value Ref Range   Glucose-Capillary 156 (H) 70 - 99 mg/dL    Comment: Glucose reference range applies only to samples taken after fasting for at least 8 hours.  CBG monitoring, ED     Status: Abnormal   Collection Time: 10/22/20 10:11 PM  Result Value Ref Range   Glucose-Capillary 181 (H) 70 - 99 mg/dL    Comment: Glucose reference range applies only to samples taken after fasting for at least 8 hours.  Comprehensive metabolic panel     Status: Abnormal   Collection Time: 10/23/20  7:05 AM  Result Value Ref Range   Sodium 140 135 - 145 mmol/L   Potassium 3.5 3.5 - 5.1 mmol/L   Chloride 106 98 - 111 mmol/L   CO2 25 22 - 32 mmol/L   Glucose, Bld 156 (H) 70 - 99 mg/dL    Comment: Glucose reference range applies only to samples taken after fasting for at least 8 hours.  BUN 16 8 - 23 mg/dL   Creatinine, Ser 1.45 (H) 0.61 - 1.24 mg/dL   Calcium 8.4 (L) 8.9 - 10.3 mg/dL   Total Protein 5.7 (L) 6.5 - 8.1  g/dL   Albumin 2.7 (L) 3.5 - 5.0 g/dL   AST 17 15 - 41 U/L   ALT 10 0 - 44 U/L   Alkaline Phosphatase 50 38 - 126 U/L   Total Bilirubin 0.6 0.3 - 1.2 mg/dL   GFR, Estimated 50 (L) >60 mL/min    Comment: (NOTE) Calculated using the CKD-EPI Creatinine Equation (2021)    Anion gap 9 5 - 15    Comment: Performed at [facility], [place] [address] [place], [place] [address]  CBC     Status: Abnormal   Collection Time: 10/23/20  7:05 AM  Result Value Ref Range   WBC 6.7 4.0 - 10.5 K/uL   RBC 2.15 (L) 4.22 - 5.81 MIL/uL   Hemoglobin 7.1 (L) 13.0 - 17.0 g/dL   HCT 22.8 (L) 39.0 - 52.0 %   MCV 106.0 (H) 80.0 - 100.0 fL   MCH 33.0 26.0 - 34.0 pg   MCHC 31.1 30.0 - 36.0 g/dL   RDW 20.9 (H) 11.5 - 15.5 %   Platelets 166 150 - 400 K/uL   nRBC 0.0 0.0 - 0.2 %    Comment: Performed at [facility], [place] [address], [place], [place] [address]  TSH     Status: Abnormal   Collection Time: 10/23/20  7:05 AM  Result Value Ref Range   TSH 15.009 (H) 0.350 - 4.500 uIU/mL    Comment: Performed by a 3rd Generation assay with a functional sensitivity of <=0.01 uIU/mL. Performed at [facility], [place] [address], [place], [place] [address]   Hemoglobin A1c     Status: Abnormal   Collection Time: 10/23/20  7:05 AM  Result Value Ref Range   Hgb A1c MFr Bld 7.8 (H) 4.8 - 5.6 %    Comment: (NOTE) Pre diabetes:          5.7%-6.4%  Diabetes:              >6.4%  Glycemic control for   <7.0% adults with diabetes    Mean Plasma Glucose 177.16 mg/dL    Comment: Performed at [facility] [address], [place], [place] [address]  Vitamin B12     Status: None   Collection Time: 10/23/20  7:05 AM  Result Value Ref Range   Vitamin B-12 668 180 - 914 pg/mL    Comment: (NOTE) This assay is not validated for testing neonatal or myeloproliferative syndrome specimens for Vitamin B12 levels. Performed at [facility], [place] [address], [place], [place] [address]   Folate, serum,  performed at [facility]     Status: None   Collection Time: 10/23/20  7:05 AM  Result Value Ref Range   Folate 22.5 >5.9 ng/mL    Comment: Performed at [facility], [place] [address] [place],  [address]  CBG monitoring, ED     Status: Abnormal   Collection Time: 10/23/20  8:09 AM  Result Value Ref Range   Glucose-Capillary 134 (H) 70 - 99 mg/dL    Comment: Glucose reference range applies only to samples taken after fasting for at least 8 hours.   Comment 1 Notify RN    Comment 2 Document in Chart   CBG monitoring, ED  Status: Abnormal   Collection Time: 10/23/20 11:03 AM  Result Value Ref Range   Glucose-Capillary 132 (H) 70 - 99 mg/dL    Comment: Glucose reference range applies only to samples taken after fasting for at least 8 hours.  CBG monitoring, ED     Status: Abnormal   Collection Time: 10/23/20 12:40 PM  Result Value Ref Range   Glucose-Capillary 131 (H) 70 - 99 mg/dL    Comment: Glucose reference range applies only to samples taken after fasting for at least 8 hours.   *Note: Due to a large number of results and/or encounters for the requested time period, some results have not been displayed. A complete set of results can be found in Results Review.    CT HEAD WO CONTRAST  Result Date: 10/21/2020 CLINICAL DATA:  Mental status change, unknown cause. Pt comes via [facility], pt has been sitting in his car for the past 11 hours outside of work because he ran out of gas, does not know how he got there. Pt hypertensive with EMS, Pt AxO x 3 EXAM: CT HEAD WITHOUT CONTRAST TECHNIQUE: Contiguous axial images were obtained from the base of the skull through the vertex without intravenous contrast. COMPARISON:  CT head 10/10/2020 BRAIN: BRAIN Cerebral ventricle sizes are concordant with the degree of cerebral volume loss. Patchy and confluent areas of decreased attenuation are noted throughout the deep and periventricular white matter of the cerebral hemispheres  bilaterally, compatible with chronic microvascular ischemic disease. No evidence of large-territorial acute infarction. No parenchymal hemorrhage. Similar-appearing posterior fossa cystic lesion with mass effect on the cerebellum. Otherwise no mass lesion. No extra-axial collection. Otherwise no mass effect or midline shift. No hydrocephalus. Basilar cisterns are patent. Vascular: No hyperdense vessel. Atherosclerotic calcifications are present within the cavernous internal carotid arteries. Skull: No acute fracture or focal lesion. Sinuses/Orbits: Sphenoid sinus mucosal thickening. Paranasal sinuses and mastoid air cells are clear. Bilateral lens replacement. Otherwise the orbits are unremarkable. Other: None. IMPRESSION: No acute intracranial abnormality. Electronically Signed   By: Iven Finn M.D.   On: 10/21/2020 21:16   MR TIBIA FIBULA LEFT W WO CONTRAST  Result Date: 10/23/2020 CLINICAL DATA:  Osteomyelitis suspected, tib/fib, xray done. Left lower leg wound with swelling and drainage EXAM: MRI OF LOWER LEFT EXTREMITY WITHOUT AND WITH CONTRAST TECHNIQUE: Multiplanar, multisequence MR imaging of the left tibia and fibula was performed both before and after administration of intravenous contrast. CONTRAST:  58mL GADAVIST GADOBUTROL 1 MMOL/ML IV SOLN COMPARISON:  X-ray 10/16/2020, 10/16/2020 FINDINGS: Bones/Joint/Cartilage No acute fracture. No malalignment. No bony erosion or periostitis. Redemonstrated 3.0 cm well-circumscribed ovoid T2 hyperintense lesion within the distal tibial diaphysis. No surrounding bone marrow edema. No cortical thinning or extraosseous soft tissue components. No additional lesions. Degenerative changes of the left knee are partially imaged at the edge of the field of view. Ligaments Field of view is not centered for ligamentous evaluation. Muscles and Tendons Diffuse fatty infiltration of the lower leg musculature. No intramuscular edema. No intramuscular fluid collection.  Soft tissues There is a wound along the anterolateral aspect of the lower leg at the level of the mid to distal fibular diaphysis with a complex, T2 heterogeneously hyperintense fluid collection within the subcutaneous soft tissues measuring approximately 12.3 x 3.0 x 7.4 cm. There is peripheral enhancement on postcontrast sequences. Collection is contiguous with the overlying skin wound. No foci of susceptibility to suggest gas within the collection or surrounding soft tissues. Mild circumferential subcutaneous edema. No deep  fascial edema or fluid collection. IMPRESSION: 1. Large peripherally-enhancing fluid collection within the subcutaneous soft tissues of the anterolateral aspect of the lower leg at the level of the mid to distal fibular diaphysis measuring 12.3 x 3.0 x 7.4 cm. Appearance is most compatible with abscess. 2. No acute osseous abnormality.  No evidence of osteomyelitis. 3. 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 Electronically Signed   By: Davina Poke D.O.   On: 10/23/2020 11:00   US Abdomen Limited RUQ (LIVER/GB)  Result Date: 10/22/2020 CLINICAL DATA:  Hyperbilirubinemia EXAM: ULTRASOUND ABDOMEN LIMITED RIGHT UPPER QUADRANT COMPARISON:  CT abdomen/pelvis 04/28/2020 FINDINGS: Gallbladder: No gallstones or wall thickening visualized. No sonographic Murphy sign noted by sonographer. Common bile duct: Diameter: 4 mm Liver: Parenchymal echogenicity is mildly increased. There are no focal lesions. There is no intrahepatic biliary ductal dilatation. Portal vein is patent on color Doppler imaging with normal direction of blood flow towards the liver. Other: There is a right pleural effusion. IMPRESSION: 1. No acute findings in the right upper quadrant. 2. Possible mild fatty infiltration of the liver. 3. Right pleural effusion. Electronically Signed   By: Valetta Mole M.D.   On: 10/22/2020 15:47    Review of Systems  Constitutional:  Negative for chills, diaphoresis and fever.  HENT:  Negative for ear discharge, ear pain, hearing loss and tinnitus.   Eyes:  Negative for photophobia and pain.  Respiratory:  Negative for cough and shortness of breath.   Cardiovascular:  Negative for chest pain.  Gastrointestinal:  Negative for abdominal pain, nausea and vomiting.  Genitourinary:  Negative for dysuria, flank pain, frequency and urgency.  Musculoskeletal:  Positive for arthralgias (Left lower leg). Negative for back pain, myalgias and neck pain.  Skin:  Positive for wound (Left shin).  Neurological:  Negative for dizziness and headaches.  Hematological:  Does not bruise/bleed easily.  Psychiatric/Behavioral:  The patient is not nervous/anxious.   Blood pressure (!) 141/54, pulse (!) 59, temperature 97.9 F (36.6 C), temperature source Oral, resp. rate 18, SpO2 97 %. Physical Exam Constitutional:      General: He is not in acute distress.    Appearance: He is well-developed. He is not diaphoretic.  HENT:     Head: Normocephalic and atraumatic.  Eyes:     General: No scleral icterus.       Right eye: No discharge.        Left eye: No discharge.     Conjunctiva/sclera: Conjunctivae normal.  Cardiovascular:     Rate and Rhythm: Normal rate and regular rhythm.  Pulmonary:     Effort: Pulmonary effort is normal. No respiratory distress.  Musculoskeletal:     Cervical back: Normal range of motion.     Comments: LLE Anterior lower leg wound with eschar, fluctuant, no discharge or odor, no ecchymosis or rash  Mild TTP  No knee or ankle effusion  Knee stable to varus/ valgus and anterior/posterior stress  Sens DPN, SPN, TN intact  Motor EHL, ext, flex, evers 5/5  DP 2+, PT 1+, No significant edema  Skin:    General: Skin is warm and dry.  Neurological:     Mental Status: He is alert.  Psychiatric:        Mood and Affect: Mood normal.         Behavior: Behavior normal.    Assessment/Plan: Left lower leg abscess -- Dr. Sharol Given  to evaluate. Please keep NPO for now.    Lisette Abu, PA-C Orthopedic Surgery [phone] 10/23/2020, 1:23 PM

## 2020-10-23 NOTE — Progress Notes (Signed)
Pharmacy Antibiotic Note  [name] is a 74 y.o. male admitted on 10/21/2020 with  wound infection .  Pharmacy has been consulted for vancomycin dosing. Pt is afebrile and WBC is WNL. SCr is trending down.   Plan: Start vancomycin 1500mg  IV Q24H F/u renal fxn, C&S, clinical status and peak/trough at [facility]  No data recorded.  Recent Labs  Lab 10/16/20 1506 10/21/20 2028 10/22/20 0922 10/22/20 1129 10/23/20 0705  WBC 6.0 7.6  --   --  6.7  CREATININE 1.64* 1.55*  --   --  1.45*  LATICACIDVEN 1.4  --  1.4 1.3  --      Estimated Creatinine Clearance: 53 mL/min (A) (by C-G formula based on SCr of 1.45 mg/dL (H)).    Allergies  Allergen Reactions   Phenothiazines Anaphylaxis   Actos [Pioglitazone] Swelling    Antimicrobials this admission: Cefepime 9/29>> Vanc 9/29 >> Fluconazole 9/29 x1  Dose adjustments this admission: none  Microbiology results: 9/29 BCx: NGTD 9/29 UCx:    Thank you for allowing pharmacy to be a part of this patient's care.  Salome Arnt, PharmD, BCPS Clinical Pharmacist Please see AMION for all pharmacy numbers 10/23/2020 9:34 AM

## 2020-10-23 NOTE — Progress Notes (Signed)
PT Cancellation Note  Patient Details Name: [name] MRN: [id number] DOB: [birth year]   Cancelled Treatment:    Reason Eval/Treat Not Completed: Patient at procedure or test/unavailable.  Receiving I&D on LLE abscess.  Retry at another time.   Ramond Dial 10/23/2020, 2:34 PM  Mee Hives, PT MS Acute Rehab Dept. Number: [place]

## 2020-10-23 NOTE — ED Notes (Signed)
Patient transported to MRI 

## 2020-10-23 NOTE — Procedures (Signed)
Procedure: Left leg I&D   Indication: Left leg abscess   Surgeon: Silvestre Gunner, PA-C   Assist: None   Anesthesia: 89ml 1% lidocaine w/epi   EBL: None   Complications: None   Findings: After risks/benefits explained patient desires to undergo procedure. Consent obtained and time out performed. The left leg was sterilely prepped and anesthetized. A 6cm incision was made lateral to the necrosed part of the skin. No gross purulence encountered but approximately 192ml coagulated hematoma evacuated. Culture sent. Pt tolerated the procedure well.       Lisette Abu, PA-C Orthopedic Surgery [phone]

## 2020-10-23 NOTE — Progress Notes (Signed)
HD 1 SUBJECTIVE:  Patient Summary: [name] is a 75 y.o. male (he/him/his) with a pertinent PMH of persistent atrial fibrillation (on Eliquis), complete heart block s/p PPM, grade II diastolic heart failure, hypertension, hypothyroidism, diabetes mellitus, CKD IIIb, who presents to [facility] with altered mental status.   Overnight Events: None  Patient was seen this AM laying comfortably in bed.  He is A&Ox2 to person and place only. He knows he is currently at [facility] but thought he was in the [place] location. He recalls being found in his car and states he was on the way to the [place]. He believes it is September 2027. Unsure of patient's baseline mental status as he lives alone and has not had contact with family.  [name] states his wound on his leg is from falling off his porch at home.    OBJECTIVE:  Vital Signs: Vitals:   10/23/20 1325 10/23/20 1403 10/23/20 1615 10/23/20 1618  BP: 128/65 (!) 118/53 (!) 153/67 (!) 161/65  Pulse: 60 64 60 60  Resp: 11 13 20 20   Temp:    98 F (36.7 C)  TempSrc:    Oral  SpO2: 100% 100% 100% 99%   Supplemental O2: Room Air SpO2: 99 %  There were no vitals filed for this visit.   Intake/Output Summary (Last 24 hours) at 10/23/2020 1920 Last data filed at 10/23/2020 1800 Gross per 24 hour  Intake 740.01 ml  Output --  Net 740.01 ml    Net IO Since Admission: 740.01 mL [10/23/20 1920]  Physical Exam: Physical Exam Vitals reviewed.  Constitutional:      General: He is not in acute distress.    Appearance: He is not ill-appearing.  HENT:     Head: Normocephalic and atraumatic.  Eyes:     Extraocular Movements: Extraocular movements intact.  Cardiovascular:     Rate and Rhythm: Normal rate and regular rhythm.     Pulses: Normal pulses.     Heart sounds: Normal heart sounds.  Pulmonary:     Effort: Pulmonary effort is normal.     Breath sounds: Normal breath sounds.  Abdominal:     General: Bowel sounds are  normal.  Musculoskeletal:     Cervical back: Normal range of motion.     Right lower leg: Edema (2+ pitting) present.     Left lower leg: No edema.     Comments: LLE hematoma, thought to be necrotic or an abscess.  Neurological:     Mental Status: He is alert. He is disoriented.    Pertinent Labs: CBC Latest Ref Rng & Units 10/23/2020 10/21/2020 10/16/2020  WBC 4.0 - 10.5 K/uL 6.7 7.6 6.0  Hemoglobin 13.0 - 17.0 g/dL 7.1(L) 7.8(L) 7.7(L)  Hematocrit 39.0 - 52.0 % 22.8(L) 24.8(L) 24.1(L)  Platelets 150 - 400 K/uL 166 197 174    CMP Latest Ref Rng & Units 10/23/2020 10/21/2020 10/16/2020  Glucose 70 - 99 mg/dL 156(H) 212(H) 263(H)  BUN 8 - 23 mg/dL 16 16 25(H)  Creatinine 0.61 - 1.24 mg/dL 1.45(H) 1.55(H) 1.64(H)  Sodium 135 - 145 mmol/L 140 139 137  Potassium 3.5 - 5.1 mmol/L 3.5 3.6 3.5  Chloride 98 - 111 mmol/L 106 106 104  CO2 22 - 32 mmol/L 25 23 26   Calcium 8.9 - 10.3 mg/dL 8.4(L) 8.7(L) 7.9(L)  Total Protein 6.5 - 8.1 g/dL 5.7(L) 6.5 6.5  Total Bilirubin 0.3 - 1.2 mg/dL 0.6 1.6(H) 0.7  Alkaline Phos 38 - 126 U/L  50 63 62  AST 15 - 41 U/L 17 21 17   ALT 0 - 44 U/L 10 14 11     Recent Labs    10/23/20 1103 10/23/20 1240 10/23/20 1614  GLUCAP 132* 131* 159*     Pertinent Imaging: MR TIBIA FIBULA LEFT W WO CONTRAST  Result Date: 10/23/2020 CLINICAL DATA:  Osteomyelitis suspected, tib/fib, xray done. Left lower leg wound with swelling and drainage EXAM: MRI OF LOWER LEFT EXTREMITY WITHOUT AND WITH CONTRAST TECHNIQUE: Multiplanar, multisequence MR imaging of the left tibia and fibula was performed both before and after administration of intravenous contrast. CONTRAST:  50mL GADAVIST GADOBUTROL 1 MMOL/ML IV SOLN COMPARISON:  X-ray 10/16/2020, 10/16/2020 FINDINGS: Bones/Joint/Cartilage No acute fracture. No malalignment. No bony erosion or periostitis. Redemonstrated 3.0 cm well-circumscribed ovoid T2 hyperintense lesion within the distal tibial diaphysis. No surrounding bone marrow  edema. No cortical thinning or extraosseous soft tissue components. No additional lesions. Degenerative changes of the left knee are partially imaged at the edge of the field of view. Ligaments Field of view is not centered for ligamentous evaluation. Muscles and Tendons Diffuse fatty infiltration of the lower leg musculature. No intramuscular edema. No intramuscular fluid collection. Soft tissues There is a wound along the anterolateral aspect of the lower leg at the level of the mid to distal fibular diaphysis with a complex, T2 heterogeneously hyperintense fluid collection within the subcutaneous soft tissues measuring approximately 12.3 x 3.0 x 7.4 cm. There is peripheral enhancement on postcontrast sequences. Collection is contiguous with the overlying skin wound. No foci of susceptibility to suggest gas within the collection or surrounding soft tissues. Mild circumferential subcutaneous edema. No deep fascial edema or fluid collection. IMPRESSION: 1. Large peripherally-enhancing fluid collection within the subcutaneous soft tissues of the anterolateral aspect of the lower leg at the level of the mid to distal fibular diaphysis measuring 12.3 x 3.0 x 7.4 cm. Appearance is most compatible with abscess. 2. No acute osseous abnormality.  No evidence of osteomyelitis. 3. 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 Electronically Signed   By: Davina Poke D.O.   On: 10/23/2020 11:00    ASSESSMENT/PLAN:  Assessment: Active Problems:   AMS (altered mental status)  [name] is a 75 y.o. male (he/him/his) with a pertinent PMH of persistent atrial fibrillation (on Eliquis), complete heart block s/p PPM, grade II diastolic heart failure, hypertension, hypothyroidism, diabetes mellitus, CKD IIIb, who presents to [facility] with altered mental status.  HD 1  Plan: #Acute  encephalopathy #Hypothyroidism Patient presented with altered mental status after being found in his car for >10 hours. He is alert to self and location but not situation or year.  Speech is clear and goal directed, however unsure if this is patient's baseline or if he is still altered. Afebrile, no leukocytosis, no electrolyte derangement.  Blood cultures showed no growth over 24 hours. TSH 15, he is currently on Synthroid 175 mcg. Unsure if patient has been med compliant, we will continue Synthroid 175 mcg and inquire further. -Trend CBC -Trend BMP -Continue Synthroid 175 mcg  #Acute on chronic macrocytic anemia #LLE Hematoma - evacuated by ortho Patient presented with LLE wound, with concern of osteomyelitis due to hx of T2DM. MRI showed no evidence of osteomyelitis, however showed what was thought to be an abscess. Ortho was consulted for an I&D, which showed that it was actually a hematoma.  About 100 mL  of coagulate hematoma evacuated and sent for culture.   Per ortho, patient will be evaluated by Dr. Due to on Sunday, quested that patient be n.p.o. for now. Patient with history of macrocytic anemia with baseline Hb ~9.  Current Hb 7.1 and downtrending. B12 and folate are within normal limits. Denies any melena or hematochezia. FOBT negative.  Because of recent evacuation of hematoma and Hb 7.1, will hold off on DVT prophylaxis.  Suspect this may be due to poor nutrition, especially after being found in a car after 11 hours. He is asymptomatic currently.  We will start patient on multivitamin. -Discontinue IV vancomycin and IV cefepime -F/u blood cultures -Continue multivitamin -Trend CBC -Follow-up hematoma culture -Hold DVT prophylaxis, for severe anemia  #Right LE swelling - possible DVT Had 2+ pitting edema on right lower extremity, however left leg did not.  Concern for possible DVT since holding DVT prophylaxis, ordered DVT ultrasound.  -Follow-up on DVT ultrasound   #HFpEF (Grade  II DD) Patient has a history of grade II diastolic heart failure. He is on lasix 20mg  daily at home; however, unclear if he takes this.  Received IV Lasix 40 mg yesterday, with good effect.  He could use more diuresing, however will hold off for now due to severe anemia.  - Holding Lasix 40mg  in the setting of severe anemia - Strict I&O, daily weights - Trend renal function     #CKDIII Patient with history of CKD III with baseline ~1.0-1.2 5 months prior. Cr still elevated, however trending down.  We will continue to monitor - Trend renal function - Avoid nephrotoxic agents    #Hx of ESBL Klebsiella UTI Patient presented with altered mental status and was noted to have urinary incontinence with EMS. Urinalysis consistent with urinary tract infection with leukocytes but negative for nitrites. Noted to have budding yeast on UA for which he was given one dose of fluconazole in the ED. Prior urine cultures with multi-drug resistant Klebsiella. He denies any dysuria or urinary incontinence and does not have any suprapubic tenderness on exam. Possible that patient may be colonized vs actual UTI. He did receive one dose of fluconazole in the ED. However, without neutropenia or planned urologic procedures, will hold off on treatment of asymptomatic candiduria.  - F/u urine and blood cultures - Trend CBC  #Type II Diabetes Mellitus HbA1c 7.8 (10/23/2020). Patient reports taking metformin daily. He is unable to recall other medications. Prior records indicate patient is on GLP-1 agonist and was previously on levemir daily. However, due to prior hypoglycemic events, was recommended to discontinue his daily insulin. CBG 130s-140s over night - Levemir 5U + SSI - CBG monitoring  -Gabapentin 300 mg twice daily   #Persistent atrial fibrillation on Eliquis #Hx of complete heart block s/p PPM CHA2DS2VASc Score - 5 HASBLED Score- 2. Patient previously advised to discontinue Eliquis due to frequent falls;  however, has not done so.  - Continue amiodarone - Will hold Eliquis at this time for severe anemia   #Hypertension #Hyperlipidemia - Continue simvastatin 40mg  daily   #Decreased A/G ratio Patient with slightly elevated total bilirubin to 1.6 and decreased A/G ratio of 0.9. Corrected calcium wnl. UA with proteinuria. RUQ US showed no acute findings, possible mild fatty infiltration of the liver, and right pleural effusion. Patient with history of BPH for which he is on tamsulosin.  - Fractionated t.bili  - SPEP/UPEP/IFE  Best Practice: Diet:  Cardiac and diabetic diet IVF: None,None VTE:  Held Code: Full  Code  PT/OT recs: Pending, none. TOC recs: Pending  Prior to Admission Living Arrangement: Home  Anticipated Discharge Location: SNF  Barriers to Discharge: Decreased caregiver support, Medical stability  Dispo: Anticipated discharge in >2 days.  Signature: Merrily Brittle, D.O. Psychiatry Resident, PGY-1 [facility] Teaching Service Pager: [phone] 7:20 PM, 10/23/2020   Please contact the on call pager after 5 pm and on weekends at [phone].

## 2020-10-23 NOTE — ED Notes (Signed)
Ortho PA at bedside.  

## 2020-10-23 NOTE — ED Notes (Signed)
Consent for I&D of abscess obtained. Nolon Rod PA at bedside

## 2020-10-23 NOTE — Progress Notes (Signed)
Date: 10/23/2020  Patient name: [name]  Medical record number: [id number]  Date of birth: Sep 12, 1945   I have seen and evaluated [name] and discussed their care with the Residency Team.  In brief, patient is a 75 year old male with past medical history of A. fib on anticoagulation, complete heart block status post PPM placement, chronic diastolic heart failure, hypertension, hypothyroidism, type 2 diabetes, CKD stage IIIb who presented to the ED with altered mental status.  Patient is seen in the ED twice for left lower extremity wound status post mechanical fall.  The first visit on September 17 patient was noted to have a skin abrasion in the left lower extremity with a surrounding blister.  He had no foreign bodies at that time and the wound was irrigated and he was discharged on doxycycline.  He presented again on September 23 for increased leg swelling and discharge from the wound and was treated with doxycycline.  On the day of admission patient was found sitting on his current side of the highway and had apparently been there for approximately 11 hours and had an episode of urinary incontinence.  He was brought to the ED for further evaluation of his altered mental status.  No fevers or chills, no chest pain, shortness of breath, no palpitations, lightheadedness, no syncope, no focal weakness, no tingling or numbness, no nausea or vomiting, no abdominal pain, no diarrhea.  Today, patient's mental status appears to have improved and he is oriented x3 and able to do simple calculations in his head.  PMHx, Fam Hx, and/or Soc Hx : As per resident admit note  Vitals:   10/23/20 0800 10/23/20 1100  BP: (!) 184/75 (!) 170/63  Pulse: 60 60  Resp: 18 17  Temp:    SpO2: 100% 100%   General: Awake, alert, oriented x3, NAD CVS: Irregular rate, normal heart sounds Lungs: CTA bilaterally Abdomen: Soft, nontender, nondistended, normoactive bowel sounds Extremities: Bilateral 1+  lower extremity pitting edema noted (right greater than left), large wound noted on lateral left lower extremity with eschar in the middle and surrounding erythema but no discharge, mildly increased warmth to palpation, no tenderness noted Psych: Normal affect but can be inappropriate at times (stated that he was surrounded by pretty doctors and that he must be in heaven) HEENT: Normocephalic, atraumatic Neuro: Oriented x3, able to simple calculations at his mind, spontaneously moving all extremities  Assessment and Plan: I have seen and evaluated the patient as outlined above. I agree with the formulated Assessment and Plan as detailed in the residents' note, with the following changes:   1.  Left lower extremity wound with underlying abscess formation: -Patient presented to the ED with altered mental status after being found on the side of the road in the setting of recent left lower extremity wound infection status post 2 courses of antibiotics including doxycycline and Keflex.  MRI of his left lower extremity shows a large 12 x 3 x 7 cm abscess extending from the mid to distal fibular diaphysis. -Patient has no leukocytosis or fevers at this time.  We will continue to monitor CBC daily -We will continue broad-spectrum antibiotic coverage given the patient has failed 2 courses of antibiotics including doxycycline and Keflex.  Continue with cefepime and vancomycin for now -Surgery consulted for large abscess drainage.  We will follow up cultures from I&D -We will follow blood cultures -Patient mental status is slowly improving.  We will continue to monitor him closely -No further  work-up at this time  2.  Worsening macrocytic anemia: -Patient noted to have worsening anemia with hemoglobin down to 7.1 today (baseline hemoglobin approximately 9) -Patient B12 and folate levels are within normal limits -Patient denies any history of alcohol use but chronic alcohol use can cause macrocytosis as  well -Patient is on anticoagulation for his A. fib.  We will hold Eliquis at this time given his worsening anemia but his FOBT was negative and he denies any melena or hemoptysis or hematemesis -It is also possible that his anemia secondary to his underlying CKD. -We will continue to monitor CBC daily and transfuse for hemoglobin less than 7  3.  AKI on CKD stage IIIb: -Patient has baseline creatinine of 1-1.2 and presented with a creatinine 1.64 on admission. -I suspect his AKI secondary to dehydration given that he was in his car at the side of the road for approximately 11 hours -Patient's creatinine has improved to 1.4 today.  He is tolerating oral intake -We will continue to monitor patient off IV fluids  4.  Acute on chronic diastolic heart failure exacerbation: -Patient was noted to have bilateral lower extremity pitting edema on exam in setting of history of chronic diastolic heart failure consistent with an acute on chronic diastolic heart failure exacerbation -We will need strict I's and O's and daily weights -Patient received 1 dose of IV Lasix 40 mg yesterday with improvement in his edema  Aldine Contes, MD 9/30/202211:26 AM

## 2020-10-24 ENCOUNTER — Inpatient Hospital Stay (HOSPITAL_COMMUNITY): Payer: No Typology Code available for payment source

## 2020-10-24 DIAGNOSIS — M7989 Other specified soft tissue disorders: Secondary | ICD-10-CM

## 2020-10-24 LAB — CBC
HCT: 27.2 % — ABNORMAL LOW (ref 39.0–52.0)
Hemoglobin: 9 g/dL — ABNORMAL LOW (ref 13.0–17.0)
MCH: 33.5 pg (ref 26.0–34.0)
MCHC: 33.1 g/dL (ref 30.0–36.0)
MCV: 101.1 fL — ABNORMAL HIGH (ref 80.0–100.0)
Platelets: 173 10*3/uL (ref 150–400)
RBC: 2.69 MIL/uL — ABNORMAL LOW (ref 4.22–5.81)
RDW: 21.8 % — ABNORMAL HIGH (ref 11.5–15.5)
WBC: 7.8 10*3/uL (ref 4.0–10.5)
nRBC: 0 % (ref 0.0–0.2)

## 2020-10-24 LAB — URINE CULTURE

## 2020-10-24 LAB — CBC WITH DIFFERENTIAL/PLATELET
Abs Immature Granulocytes: 0.05 10*3/uL (ref 0.00–0.07)
Basophils Absolute: 0 10*3/uL (ref 0.0–0.1)
Basophils Relative: 1 %
Eosinophils Absolute: 0.1 10*3/uL (ref 0.0–0.5)
Eosinophils Relative: 2 %
HCT: 20.9 % — ABNORMAL LOW (ref 39.0–52.0)
Hemoglobin: 6.7 g/dL — CL (ref 13.0–17.0)
Immature Granulocytes: 1 %
Lymphocytes Relative: 16 %
Lymphs Abs: 1 10*3/uL (ref 0.7–4.0)
MCH: 33.2 pg (ref 26.0–34.0)
MCHC: 32.1 g/dL (ref 30.0–36.0)
MCV: 103.5 fL — ABNORMAL HIGH (ref 80.0–100.0)
Monocytes Absolute: 0.7 10*3/uL (ref 0.1–1.0)
Monocytes Relative: 11 %
Neutro Abs: 4.5 10*3/uL (ref 1.7–7.7)
Neutrophils Relative %: 69 %
Platelets: 152 10*3/uL (ref 150–400)
RBC: 2.02 MIL/uL — ABNORMAL LOW (ref 4.22–5.81)
RDW: 20.6 % — ABNORMAL HIGH (ref 11.5–15.5)
WBC: 6.5 10*3/uL (ref 4.0–10.5)
nRBC: 0 % (ref 0.0–0.2)

## 2020-10-24 LAB — IRON AND TIBC
Iron: 29 ug/dL — ABNORMAL LOW (ref 45–182)
Saturation Ratios: 12 % — ABNORMAL LOW (ref 17.9–39.5)
TIBC: 249 ug/dL — ABNORMAL LOW (ref 250–450)
UIBC: 220 ug/dL

## 2020-10-24 LAB — BASIC METABOLIC PANEL
Anion gap: 8 (ref 5–15)
BUN: 15 mg/dL (ref 8–23)
CO2: 25 mmol/L (ref 22–32)
Calcium: 8.1 mg/dL — ABNORMAL LOW (ref 8.9–10.3)
Chloride: 105 mmol/L (ref 98–111)
Creatinine, Ser: 1.6 mg/dL — ABNORMAL HIGH (ref 0.61–1.24)
GFR, Estimated: 44 mL/min — ABNORMAL LOW (ref >60)
Glucose, Bld: 251 mg/dL — ABNORMAL HIGH (ref 70–99)
Potassium: 3.6 mmol/L (ref 3.5–5.1)
Sodium: 138 mmol/L (ref 135–145)

## 2020-10-24 LAB — GLUCOSE, CAPILLARY
Glucose-Capillary: 208 mg/dL — ABNORMAL HIGH (ref 70–99)
Glucose-Capillary: 190 mg/dL — ABNORMAL HIGH (ref 70–99)
Glucose-Capillary: 169 mg/dL — ABNORMAL HIGH (ref 70–99)
Glucose-Capillary: 149 mg/dL — ABNORMAL HIGH (ref 70–99)

## 2020-10-24 LAB — MAGNESIUM: Magnesium: 1.9 mg/dL (ref 1.7–2.4)

## 2020-10-24 LAB — PREPARE RBC (CROSSMATCH)

## 2020-10-24 LAB — FERRITIN: Ferritin: 585 ng/mL — ABNORMAL HIGH (ref 24–336)

## 2020-10-24 MED ORDER — VANCOMYCIN HCL IN DEXTROSE 1-5 GM/200ML-% IV SOLN
1000.0000 mg | INTRAVENOUS | Status: DC
  Administered 2020-10-24 – 2020-10-25 (×2): 1000 mg via INTRAVENOUS
  Filled 2020-10-24 (×2): qty 200

## 2020-10-24 MED ORDER — SENNOSIDES-DOCUSATE SODIUM 8.6-50 MG PO TABS
1.0000 | ORAL_TABLET | Freq: Every evening | ORAL | Status: DC | PRN
  Administered 2020-10-24 – 2020-10-28 (×4): 1 via ORAL
  Filled 2020-10-24 (×4): qty 1

## 2020-10-24 MED ORDER — FUROSEMIDE 20 MG PO TABS
20.0000 mg | ORAL_TABLET | Freq: Every morning | ORAL | Status: DC
  Administered 2020-10-25 – 2020-10-29 (×4): 20 mg via ORAL
  Filled 2020-10-24 (×5): qty 1

## 2020-10-24 MED ORDER — SODIUM CHLORIDE 0.9% IV SOLUTION
Freq: Once | INTRAVENOUS | Status: AC
  Administered 2020-10-24: 10 mL/h via INTRAVENOUS

## 2020-10-24 MED ORDER — POLYETHYLENE GLYCOL 3350 17 G PO PACK
17.0000 g | PACK | Freq: Every day | ORAL | Status: DC
  Administered 2020-10-25 – 2020-10-29 (×4): 17 g via ORAL
  Filled 2020-10-24 (×4): qty 1

## 2020-10-24 NOTE — Progress Notes (Signed)
Patient's brother called back Nicholas Lose) stated that he will be coming in the morning to sign consent for blood transfusion.

## 2020-10-24 NOTE — Plan of Care (Signed)
  Problem: Activity: Goal: Risk for activity intolerance will decrease Outcome: Progressing   Problem: Nutrition: Goal: Adequate nutrition will be maintained Outcome: Progressing   Problem: Coping: Goal: Level of anxiety will decrease Outcome: Progressing   Problem: Elimination: Goal: Will not experience complications related to bowel motility Outcome: Progressing   Problem: Safety: Goal: Ability to remain free from injury will improve Outcome: Progressing   

## 2020-10-24 NOTE — Evaluation (Signed)
Occupational Therapy Evaluation Patient Details Name: [name] MRN: [id number] DOB: 1945-11-20 Today's Date: 10/24/2020   History of Present Illness 75 y.o. M admitted on 10/22/20 due to AMS. On 9/30 pt had an I&D of LLE abcess. Past medical history of A. 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   Clinical Impression   Pt admitted with concerns listed above. PTA pt reported that he was independent with all ADL's and functional mobility, using a cane "or something" to walk with. At this time, pt presents with increased confusion, difficulty attending to tasks, problem solving, and safety awareness. Additionally pt demonstrates increased weakness, requiring min-max A for all ADL's and functional mobility. Assessment of mobility limited this session due to pt getting ready to receive a blood transfusion. OT will follow up to further assess and encourage mobility and ADL's.      Recommendations for follow up therapy are one component of a multi-disciplinary discharge planning process, led by the attending physician.  Recommendations may be updated based on patient status, additional functional criteria and insurance authorization.   Follow Up Recommendations  SNF    Equipment Recommendations  Other (comment) (RW)    Recommendations for Other Services       Precautions / Restrictions Precautions Precautions: Fall Restrictions Weight Bearing Restrictions: No      Mobility Bed Mobility Overal bed mobility: Needs Assistance Bed Mobility: Supine to Sit;Sit to Supine     Supine to sit: Mod assist Sit to supine: Mod assist   General bed mobility comments: Mod A to sequence task and to assist with bringing trunk up to sitting and BLE back into bed.    Transfers                 General transfer comment: deferred due to pt needing to be hooked up  for transfusion    Balance Overall balance assessment: Needs assistance Sitting-balance support: Feet supported;Single extremity supported Sitting balance-Leahy Scale: Fair                                     ADL either performed or assessed with clinical judgement   ADL Overall ADL's : Needs assistance/impaired Eating/Feeding: Set up;Sitting   Grooming: Set up;Sitting   Upper Body Bathing: Minimal assistance;Sitting   Lower Body Bathing: Moderate assistance;Sitting/lateral leans;Sit to/from stand   Upper Body Dressing : Min guard;Sitting   Lower Body Dressing: Moderate assistance;Sitting/lateral leans;Sit to/from stand   Toilet Transfer: Moderate assistance;Stand-pivot   Toileting- Clothing Manipulation and Hygiene: Moderate assistance;Sit to/from stand;Sitting/lateral lean         General ADL Comments: Assessment limited as RN was bringing in blood for pt to be transfused. Pt requiring min-mod A for most ADL's due to weakness and confusion requring verbal cues and assist.     Vision Baseline Vision/History: 1 Wears glasses Ability to See in Adequate Light: 0 Adequate Patient Visual Report: No change from baseline Vision Assessment?: No apparent visual deficits     Perception Perception Perception Tested?: No   Praxis Praxis Praxis tested?: Not tested    Pertinent Vitals/Pain Pain Assessment: No/denies pain     Hand Dominance Right   Extremity/Trunk Assessment Upper Extremity Assessment Upper Extremity Assessment: Overall WFL for tasks assessed   Lower Extremity Assessment Lower Extremity Assessment: Defer to PT evaluation   Cervical /  Trunk Assessment Cervical / Trunk Assessment: Normal   Communication Communication Communication: No difficulties   Cognition Arousal/Alertness: Awake/alert Behavior During Therapy: Impulsive Overall Cognitive Status: Impaired/Different from baseline Area of Impairment:  Orientation;Memory;Safety/judgement;Awareness;Problem solving                 Orientation Level: Disoriented to;Time;Situation   Memory: Decreased recall of precautions;Decreased short-term memory   Safety/Judgement: Decreased awareness of safety;Decreased awareness of deficits Awareness: Emergent Problem Solving: Slow processing General Comments: Pt knows that he is in the hospital and knows that his LLE needs to be worked on, otherwise he does not know why he is here. Seems to have good recall as to the events leading up to him being hospitalized.   General Comments  VSS, dressing intact on LLE with no visable drainage    Exercises     Shoulder Instructions      Home Living Family/patient expects to be discharged to:: Private residence Living Arrangements: Alone Available Help at Discharge: Family;Available PRN/intermittently Type of Home: Mobile home Home Access: Stairs to enter     Home Layout: One level     Bathroom Shower/Tub: Occupational psychologist: Standard Bathroom Accessibility: Yes How Accessible: Accessible via walker Home Equipment: [place] - single point   Additional Comments: Pt unsure what DME he has due to increased confusion      Prior Functioning/Environment Level of Independence: Independent with assistive device(s)        Comments: Pt reports using "something" to mobilize with at home        OT Problem List: Decreased strength;Decreased range of motion;Decreased activity tolerance;Impaired balance (sitting and/or standing);Decreased cognition;Decreased safety awareness;Decreased knowledge of use of DME or AE      OT Treatment/Interventions: Self-care/ADL training;Therapeutic exercise;Energy conservation;DME and/or AE instruction;Therapeutic activities;Patient/family education;Balance training;Cognitive remediation/compensation    OT Goals(Current goals can be found in the care plan section) Acute Rehab OT Goals Patient Stated  Goal: To go home OT Goal Formulation: With patient Time For Goal Achievement: 11/07/20 Potential to Achieve Goals: Good ADL Goals Pt Will Perform Grooming: with modified independence;standing Pt Will Perform Lower Body Bathing: with modified independence;sitting/lateral leans;sit to/from stand Pt Will Perform Lower Body Dressing: with modified independence;sitting/lateral leans;sit to/from stand Pt Will Transfer to Toilet: with modified independence;ambulating Pt Will Perform Toileting - Clothing Manipulation and hygiene: with modified independence;sitting/lateral leans;sit to/from stand  OT Frequency: Min 2X/week   Barriers to D/C:    Pt lives alone       Co-evaluation              AM-PAC OT "6 Clicks" Daily Activity     Outcome Measure Help from another person eating meals?: A Little Help from another person taking care of personal grooming?: A Little Help from another person toileting, which includes using toliet, bedpan, or urinal?: A Lot Help from another person bathing (including washing, rinsing, drying)?: A Lot Help from another person to put on and taking off regular upper body clothing?: A Little Help from another person to put on and taking off regular lower body clothing?: A Lot 6 Click Score: 15   End of Session Nurse Communication: Mobility status  Activity Tolerance: Patient tolerated treatment well Patient left: in bed;with call bell/phone within reach;with bed alarm set;with nursing/sitter in room  OT Visit Diagnosis: Unsteadiness on feet (R26.81);Other abnormalities of gait and mobility (R26.89);Muscle weakness (generalized) (M62.81)                Time: 1194-1740 OT  Time Calculation (min): 17 min Charges:  OT General Charges $OT Visit: 1 Visit OT Evaluation $OT Eval Moderate Complexity: 1 Mod  [name] Delpilar H., OTR/L Acute Rehabilitation  [name] 10/24/2020, 1:34 PM

## 2020-10-24 NOTE — Progress Notes (Signed)
Brother at the bedside- obtained consent. The patient is disoriented. However, both brother and patient were educated.

## 2020-10-24 NOTE — Progress Notes (Signed)
Received a call from lab regarding pt's critical hgb level of 6.7. On call MD was contacted with the results. Attempted to call family to obtain a consent for blood transfusion. Waiting for family to call back.

## 2020-10-24 NOTE — Plan of Care (Signed)
  Problem: Activity: Goal: Risk for activity intolerance will decrease Outcome: Progressing   Problem: Nutrition: Goal: Adequate nutrition will be maintained Outcome: Progressing   Problem: Coping: Goal: Level of anxiety will decrease Outcome: Progressing   Problem: Elimination: Goal: Will not experience complications related to bowel motility Outcome: Progressing   

## 2020-10-24 NOTE — Progress Notes (Signed)
Pharmacy Antibiotic Note  [name] is a 75 y.o. male admitted on 10/21/2020 with  wound infection .  Pharmacy has been consulted for vancomycin dosing.  WBC 7.6, afebrile SCr 1.6 - baseline 1-1.2  Plan: Cefepime 2gm IV x1 per MD Vancomycin 1000 mg IV Q 24 hrs. Goal AUC 400-550. Expected AUC: 460.1 SCr used: 1.6   Temp (24hrs), Avg:97.9 F (36.6 C), Min:97.7 F (36.5 C), Max:98.1 F (36.7 C)  Recent Labs  Lab 10/21/20 2028 10/22/20 0922 10/22/20 1129 10/23/20 0705 10/24/20 0149  WBC 7.6  --   --  6.7 6.5  CREATININE 1.55*  --   --  1.45* 1.60*  LATICACIDVEN  --  1.4 1.3  --   --      Estimated Creatinine Clearance: 49.6 mL/min (A) (by C-G formula based on SCr of 1.6 mg/dL (H)).    Allergies  Allergen Reactions   Phenothiazines Anaphylaxis   Actos [Pioglitazone] Swelling    Antimicrobials this admission: Cefepime 9/29 x1 Fluconazole 9/29 x1 Vancomycin 9/29 >>   Dose adjustments this admission: Decreased from 1500 mg q24h to 1000 mg q24h based on worsening SCr.   Microbiology results: 9/29 BCx: NGTD 9/29 UCx:  60k colonies of yeast   Thank you for allowing pharmacy to be a part of this patient's care.  Varney Daily, PharmD PGY1 Pharmacy Resident  Please check AMION for all [facility] pharmacy phone numbers After 10:00 PM call main pharmacy [phone]
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
## 2020-10-24 NOTE — Evaluation (Signed)
Physical Therapy Evaluation Patient Details Name: [name] MRN: [id number] DOB: [birth year] Today's Date: 10/24/2020  History of Present Illness  75 y.o. M admitted on 10/22/20 due to AMS. On 9/30 pt had an I&D of LLE abcess. Past medical history of A. 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  Clinical Impression  Pt admitted with/for AMS.  Pt presently weak and needing generally min assist, but light mode to stand from the lower toilet.  Pt currently limited functionally due to the problems listed. ( See problems list.)   Pt will benefit from PT to maximize function and safety in order to get ready for next venue listed below.        Recommendations for follow up therapy are one component of a multi-disciplinary discharge planning process, led by the attending physician.  Recommendations may be updated based on patient status, additional functional criteria and insurance authorization.  Follow Up Recommendations SNF;Other (comment) (but will work toward [facility] if family can provide enough assist.)    Equipment Recommendations   (TBA)    Recommendations for Other Services       Precautions / Restrictions Precautions Precautions: Fall      Mobility  Bed Mobility Overal bed mobility: Needs Assistance Bed Mobility: Supine to Sit;Sit to Supine     Supine to sit: Mod assist Sit to supine: Mod assist   General bed mobility comments: Mod A to sequence task and to assist with bringing trunk up to sitting and BLE back into bed.    Transfers Overall transfer level: Needs assistance Equipment used: None Transfers: Sit to/from Stand Sit to Stand: Min assist;Mod assist (mod from low toilet, min from the bed.)         General transfer comment: cues for hand placement and min/mod forward and boost assist  Ambulation/Gait Ambulation/Gait assistance: Min  assist Gait Distance (Feet): 20 Feet Assistive device: IV Pole Gait Pattern/deviations: Step-through pattern   Gait velocity interpretation: <1.8 ft/sec, indicate of risk for recurrent falls General Gait Details: mildly unsteady and weak-kneed.  Quick to fatigue, but VSS  Stairs            Wheelchair Mobility    Modified Rankin (Stroke Patients Only)       Balance Overall balance assessment: Needs assistance Sitting-balance support: Feet supported;Single extremity supported Sitting balance-Leahy Scale: Fair       Standing balance-Leahy Scale: Poor Standing balance comment: reliant on external support or the IV pole                             Pertinent Vitals/Pain      Home Living Family/patient expects to be discharged to:: Private residence Living Arrangements: Alone Available Help at Discharge: Family;Available PRN/intermittently Type of Home: Mobile home Home Access: Stairs to enter     Home Layout: One level Home Equipment: Cane - single point Additional Comments: RW, but doesn't use it much    Prior Function Level of Independence: Independent with assistive device(s)         Comments: Pt reports using "something" to mobilize with at home     Hand Dominance   Dominant Hand: Right    Extremity/Trunk Assessment   Upper Extremity Assessment Upper Extremity Assessment: Overall WFL for tasks assessed    Lower Extremity Assessment Lower Extremity Assessment: Generalized weakness    Cervical / Trunk Assessment  Cervical / Trunk Assessment: Normal  Communication   Communication: No difficulties  Cognition Arousal/Alertness: Awake/alert Behavior During Therapy: Impulsive Overall Cognitive Status: Impaired/Different from baseline Area of Impairment: Orientation;Memory;Safety/judgement;Awareness;Problem solving                 Orientation Level: Disoriented to;Time;Situation   Memory: Decreased recall of  precautions;Decreased short-term memory   Safety/Judgement: Decreased awareness of safety;Decreased awareness of deficits Awareness: Emergent Problem Solving: Slow processing General Comments: Pt knows that he is in the hospital and knows that his LLE needs to be worked on, otherwise he does not know why he is here. Seems to have good recall as to the events leading up to him being hospitalized.      General Comments      Exercises     Assessment/Plan    PT Assessment Patient needs continued PT services  PT Problem List Decreased strength;Decreased activity tolerance;Decreased balance;Decreased mobility;Decreased safety awareness;Decreased knowledge of use of DME       PT Treatment Interventions      PT Goals (Current goals can be found in the Care Plan section)  Acute Rehab PT Goals Patient Stated Goal: To go home PT Goal Formulation: With patient Time For Goal Achievement: 11/07/20 Potential to Achieve Goals: Good    Frequency Min 3X/week   Barriers to discharge        Co-evaluation               AM-PAC PT "6 Clicks" Mobility  Outcome Measure Help needed turning from your back to your side while in a flat bed without using bedrails?: A Little Help needed moving from lying on your back to sitting on the side of a flat bed without using bedrails?: A Little Help needed moving to and from a bed to a chair (including a wheelchair)?: A Little Help needed standing up from a chair using your arms (e.g., wheelchair or bedside chair)?: A Lot Help needed to walk in hospital room?: A Little Help needed climbing 3-5 steps with a railing? : A Lot 6 Click Score: 16    End of Session   Activity Tolerance: Patient tolerated treatment well;Patient limited by fatigue Patient left: in chair;with call bell/phone within reach;with chair alarm set Nurse Communication: Mobility status PT Visit Diagnosis: Unsteadiness on feet (R26.81)    Time: 5400-8676 PT Time Calculation  (min) (ACUTE ONLY): 24 min   Charges:   PT Evaluation $PT Eval Moderate Complexity: 1 Mod PT Treatments $Gait Training: 8-22 mins        10/24/2020  Ginger Carne., PT Acute Rehabilitation Services [phone]  (pager) [phone]  (office)  Tessie Fass Allister Lessley 10/24/2020, 5:01 PM

## 2020-10-24 NOTE — Progress Notes (Signed)
LLE venous duplex has been completed.   Results can be found under chart review under CV PROC. 10/24/2020 11:11 AM Cola Gane RVT, RDMS

## 2020-10-25 ENCOUNTER — Encounter (HOSPITAL_COMMUNITY): Payer: No Typology Code available for payment source

## 2020-10-25 LAB — AEROBIC CULTURE W GRAM STAIN (SUPERFICIAL SPECIMEN): Culture: NO GROWTH

## 2020-10-25 LAB — BASIC METABOLIC PANEL
Anion gap: 6 (ref 5–15)
BUN: 18 mg/dL (ref 8–23)
CO2: 25 mmol/L (ref 22–32)
Calcium: 8.2 mg/dL — ABNORMAL LOW (ref 8.9–10.3)
Chloride: 105 mmol/L (ref 98–111)
Creatinine, Ser: 1.64 mg/dL — ABNORMAL HIGH (ref 0.61–1.24)
GFR, Estimated: 43 mL/min — ABNORMAL LOW (ref >60)
Glucose, Bld: 209 mg/dL — ABNORMAL HIGH (ref 70–99)
Potassium: 3.9 mmol/L (ref 3.5–5.1)
Sodium: 136 mmol/L (ref 135–145)

## 2020-10-25 LAB — VANCOMYCIN, TROUGH: Vancomycin Tr: 22 ug/mL (ref 15–20)

## 2020-10-25 LAB — CBC WITH DIFFERENTIAL/PLATELET
Abs Immature Granulocytes: 0.06 10*3/uL (ref 0.00–0.07)
Basophils Absolute: 0.1 10*3/uL (ref 0.0–0.1)
Basophils Relative: 1 %
Eosinophils Absolute: 0.1 10*3/uL (ref 0.0–0.5)
Eosinophils Relative: 2 %
HCT: 23.6 % — ABNORMAL LOW (ref 39.0–52.0)
Hemoglobin: 7.4 g/dL — ABNORMAL LOW (ref 13.0–17.0)
Immature Granulocytes: 1 %
Lymphocytes Relative: 18 %
Lymphs Abs: 1.3 10*3/uL (ref 0.7–4.0)
MCH: 31.8 pg (ref 26.0–34.0)
MCHC: 31.4 g/dL (ref 30.0–36.0)
MCV: 101.3 fL — ABNORMAL HIGH (ref 80.0–100.0)
Monocytes Absolute: 0.8 10*3/uL (ref 0.1–1.0)
Monocytes Relative: 12 %
Neutro Abs: 4.7 10*3/uL (ref 1.7–7.7)
Neutrophils Relative %: 66 %
Platelets: 146 10*3/uL — ABNORMAL LOW (ref 150–400)
RBC: 2.33 MIL/uL — ABNORMAL LOW (ref 4.22–5.81)
RDW: 21.5 % — ABNORMAL HIGH (ref 11.5–15.5)
WBC: 7 10*3/uL (ref 4.0–10.5)
nRBC: 0 % (ref 0.0–0.2)

## 2020-10-25 LAB — TYPE AND SCREEN
ABO/RH(D): A POS
Antibody Screen: NEGATIVE
Unit division: 0
Unit division: 0

## 2020-10-25 LAB — GLUCOSE, CAPILLARY
Glucose-Capillary: 186 mg/dL — ABNORMAL HIGH (ref 70–99)
Glucose-Capillary: 165 mg/dL — ABNORMAL HIGH (ref 70–99)
Glucose-Capillary: 169 mg/dL — ABNORMAL HIGH (ref 70–99)
Glucose-Capillary: 193 mg/dL — ABNORMAL HIGH (ref 70–99)

## 2020-10-25 LAB — BPAM RBC
Blood Product Expiration Date: 202210022359
Blood Product Expiration Date: 202210062359
ISSUE DATE / TIME: 202210011233
ISSUE DATE / TIME: 202210011505
Unit Type and Rh: 6200
Unit Type and Rh: 6200

## 2020-10-25 LAB — PSA: Prostatic Specific Antigen: 0.54 ng/mL (ref 0.00–4.00)

## 2020-10-25 LAB — VANCOMYCIN, PEAK: Vancomycin Pk: 38 ug/mL (ref 30–40)

## 2020-10-25 MED ORDER — AMOXICILLIN-POT CLAVULANATE 875-125 MG PO TABS: 1.0000 | ORAL_TABLET | Freq: Two times a day (BID) | ORAL | Status: DC

## 2020-10-25 MED ORDER — DOXYCYCLINE HYCLATE 100 MG PO TABS
100.0000 mg | ORAL_TABLET | Freq: Two times a day (BID) | ORAL | Status: DC
  Administered 2020-10-25 – 2020-10-26 (×2): 100 mg via ORAL
  Filled 2020-10-25 (×2): qty 1

## 2020-10-25 MED ORDER — AMOXICILLIN-POT CLAVULANATE 875-125 MG PO TABS
1.0000 | ORAL_TABLET | Freq: Two times a day (BID) | ORAL | Status: DC
  Administered 2020-10-25 – 2020-10-26 (×2): 1 via ORAL
  Filled 2020-10-25 (×2): qty 1

## 2020-10-25 NOTE — Evaluation (Signed)
Clinical/Bedside Swallow Evaluation Patient Details  Name: [name] MRN: [id number] Date of Birth: 1945/12/15  Today's Date: 10/25/2020 Time: SLP Start Time (ACUTE ONLY): 0853 SLP Stop Time (ACUTE ONLY): 0907 SLP Time Calculation (min) (ACUTE ONLY): 14 min  Past Medical History:  Past Medical History:  Diagnosis Date   Arthritis    Atrial fibrillation (HCC)    BPH (benign prostatic hypertrophy) 04/15/2010   Cataract    Colon polyps    DEPRESSION 01/22/2009   Heart valve replaced    HYPERCHOLESTEROLEMIA 01/22/2009   HYPERTENSION 01/22/2009   Dr. 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 ([place]) 08/27/2019   Stage 3b chronic kidney disease ([place]) 09/18/2019   Tremors of nervous system    ?ptsd   Ulcer    Vitreous hemorrhage of left eye ([place]) 08/27/2019   Past Surgical History:  Past Surgical History:  Procedure Laterality Date   AORTIC VALVE REPLACEMENT  July 2006   #20 stentless [place] porcine valve   APPENDECTOMY     bilateral cataract surg     BIOPSY  05/01/2020   Procedure: BIOPSY;  Surgeon: Eloise Harman, DO;  [facility]: [facility];  Service: Endoscopy;;   COLONOSCOPY  04/24/2007   [name]: normal   COLONOSCOPY WITH ESOPHAGOGASTRODUODENOSCOPY (EGD) N/A 04/05/2012   normal rectum, somewhat elongated and redundant colon, otherwise normal. Remote history of colon polyps. EGD: normal esophagus, bile-stained gastric mucosa, diffuse patchy gastric erythema, small duodenal bulbar ulcer with associated erosions. Negative H.pylori. H.pylori serology also negative.    COLONOSCOPY WITH PROPOFOL N/A 05/01/2020   Procedure: COLONOSCOPY WITH PROPOFOL;  Surgeon: Eloise Harman, DO;  Location: [facility];  Service: Endoscopy;  Laterality: N/A;   DENTAL SURGERY  05/2004   Dental extractions   ESOPHAGOGASTRODUODENOSCOPY (EGD) WITH PROPOFOL N/A 05/01/2020   Procedure:  ESOPHAGOGASTRODUODENOSCOPY (EGD) WITH PROPOFOL;  Surgeon: Eloise Harman, DO;  [facility];  Service: Endoscopy;  Laterality: N/A;   EYE SURGERY Bilateral    cataract   HEEL SPUR SURGERY Bilateral    resection of heel spur   KNEE ARTHROSCOPY WITH LATERAL MENISECTOMY Right 04/04/2014   Procedure: KNEE ARTHROSCOPY WITH LATERAL MENISECTOMY;  Surgeon: Carole Civil, MD;  Location: [facility];  Service: Orthopedics;  Laterality: Right;   PACEMAKER IMPLANT N/A 06/13/2016   Procedure: Pacemaker Implant- Dual Chamber;  Surgeon: Evans Lance, MD;  Location: [facility];  Service: Cardiovascular;  Laterality: N/A;   PACEMAKER INSERTION  2018   THYROIDECTOMY  03/21/2013   DR [name]   THYROIDECTOMY N/A 03/21/2013   Procedure: THYROIDECTOMY;  Surgeon: [name], MD;  Location: [facility];  Service: General;  Laterality: N/A;   TONSILLECTOMY     HPI:  Pt is a 75 y.o. male admitted on 10/22/20 due to AMS. On 9/30 pt had an I&D of LLE abcess. Dx Acute encephalopathy secondary to hypothyroidism; unintenstional noncompliance with Synthyroid suspected since pt was taking it with food. Past medical history of A. 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    Assessment / Plan / Recommendation  Clinical Impression  Pt was seen for bedside swallow evaluation and he denied a history of dysphagia. Oral mechanism exam was [facility]. Pt presented with six anterior teeth only and he stated that he has had some difficulty with  foods received since they are too hard. He tolerated all solids and liquids without signs or symptoms of oropharyngeal dysphagia. A dysphagia 3 diet with thin liquids is recommended at this time per pt's request, but this may advanced back to regular textures if pt subsequently wishes. Further skilled SLP services are not clinically indicated at this time. SLP  Visit Diagnosis: Dysphagia, unspecified (R13.10)    Aspiration Risk  Mild aspiration risk    Diet Recommendation Thin liquid;Dysphagia 3 (Mech soft)   Liquid Administration via: Cup;Straw Medication Administration: Whole meds with liquid Supervision: Patient able to self feed Postural Changes: Remain upright for at least 30 minutes after po intake    Other  Recommendations Oral Care Recommendations: Oral care BID    Recommendations for follow up therapy are one component of a multi-disciplinary discharge planning process, led by the attending physician.  Recommendations may be updated based on patient status, additional functional criteria and insurance authorization.  Follow up Recommendations None      Frequency and Duration            Prognosis Prognosis for Safe Diet Advancement: Good Barriers to Reach Goals:  (adavncement safe, but pt would like softer solids due to limited dentition.)      Swallow Study   General Date of Onset: 10/24/20 HPI: Pt is a 75 y.o. male admitted on 10/22/20 due to AMS. On 9/30 pt had an I&D of LLE abcess. Dx Acute encephalopathy secondary to hypothyroidism; unintenstional noncompliance with Synthyroid suspected since pt was taking it with food. Past medical history of A. 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 Type of Study: Bedside Swallow Evaluation Previous Swallow Assessment: none Diet Prior to this Study: Regular;Thin liquids Temperature Spikes Noted: No Respiratory Status: Room air History of Recent Intubation: No Behavior/Cognition: Alert;Cooperative;Pleasant mood;Confused;Doesn't follow directions Oral Cavity Assessment: Within Functional Limits Oral Care Completed by SLP: No Oral Cavity - Dentition: Adequate natural dentition Vision: Functional for self-feeding Self-Feeding Abilities: Able to feed  self Patient Positioning: Upright in bed;Postural control adequate for testing Baseline Vocal Quality: Normal Volitional Cough: Strong Volitional Swallow: Able to elicit    Oral/Motor/Sensory Function Overall Oral Motor/Sensory Function: Within functional limits   Ice Chips Ice chips: Within functional limits Presentation: Spoon   Thin Liquid Thin Liquid: Within functional limits Presentation: Straw    Nectar Thick Nectar Thick Liquid: Not tested   Honey Thick Honey Thick Liquid: Not tested   Puree Puree: Within functional limits Presentation: Spoon   Solid     Solid: Within functional limits Presentation: [place] [address], [place], [place] Office number [phone] Pager [phone]  Horton Marshall 10/25/2020,9:10 AM

## 2020-10-25 NOTE — Progress Notes (Signed)
HD 3 SUBJECTIVE:  Patient Summary: [name] is a 75 y.o. male (he/him/his) with a pertinent PMH of persistent atrial fibrillation (on Eliquis), complete heart block s/p PPM, grade II diastolic heart failure, hypertension, hypothyroidism, diabetes mellitus, CKD IIIb, who presents to [facility] with altered mental status.   Overnight Events: None.  This AM, [name] states he is experiencing numbness and tingling in his bilateral lower extremities that has been intermittent over the last several days.  In addition he has been experiencing urinary incontinence.  He states he can feel the need to urinate, however the urine starts going out on its own and he cannot stop the flow.  Otherwise he has been urinating without any difficulty.  Otherwise, we discussed recommendations for SNF on discharge.  Patient states he would like to think about it.  He states that he has never been to a SNF before however this is contradictory to his chart review  OBJECTIVE:  Vital Signs: Vitals:   10/24/20 1556 10/24/20 1700 10/24/20 1950 10/25/20 0350  BP: (!) 153/62 (!) 165/74 (!) 141/77 (!) 148/62  Pulse: 64 60 62 63  Resp: 18 16 20 18   Temp: 97.9 F (36.6 C) 98.7 F (37.1 C) 98.7 F (37.1 C) 98.1 F (36.7 C)  TempSrc: Oral Oral Oral Oral  SpO2: 100% 100% 98% 97%  Weight:    108.4 kg   Supplemental O2: Room Air SpO2: 97 %  Filed Weights   10/24/20 0350 10/25/20 0350  Weight: 106.9 kg 108.4 kg     Intake/Output Summary (Last 24 hours) at 10/25/2020 0630 Last data filed at 10/25/2020 0353 Gross per 24 hour  Intake 616.66 ml  Output 200 ml  Net 416.66 ml    Net IO Since Admission: 1,156.67 mL [10/25/20 0630]  Physical Exam: Physical Exam Vitals and nursing note reviewed.  Constitutional:      General: He is not in acute distress.    Appearance: He is not ill-appearing.  HENT:     Head: Normocephalic and atraumatic.     Mouth/Throat:     Mouth: Mucous membranes are moist.      Pharynx: Oropharynx is clear.  Eyes:     Extraocular Movements: Extraocular movements intact.     Pupils: Pupils are equal, round, and reactive to light.  Cardiovascular:     Rate and Rhythm: Normal rate and regular rhythm.     Pulses: Normal pulses.          Dorsalis pedis pulses are 2+ on the right side and 2+ on the left side.  Pulmonary:     Effort: Pulmonary effort is normal. No respiratory distress.  Abdominal:     Tenderness: There is no abdominal tenderness. There is no guarding.  Musculoskeletal:     Cervical back: Normal range of motion.     Right lower leg: No edema.     Left lower leg: No edema.     Comments: Left lower extremity necrotic ulcer with some sanguinous drainage.  No migration of erythema.  No foul odor.  No purulent drainage.  Skin:    General: Skin is warm and dry.  Neurological:     Mental Status: He is alert and oriented to person, place, and time. Mental status is at baseline.     Cranial Nerves: No cranial nerve deficit or dysarthria.     Sensory: Sensation is intact.     Comments: A&Ox3 - person, place, and situation.  5/5 strength in all extremities.  Psychiatric:        Attention and Perception: Attention and perception normal.        Mood and Affect: Mood and affect normal.        Behavior: Behavior normal.        Cognition and Memory: Memory is impaired.   Pertinent Labs: CBC Latest Ref Rng & Units 10/25/2020 10/24/2020 10/24/2020  WBC 4.0 - 10.5 K/uL 7.0 7.8 6.5  Hemoglobin 13.0 - 17.0 g/dL 7.4(L) 9.0(L) 6.7(LL)  Hematocrit 39.0 - 52.0 % 23.6(L) 27.2(L) 20.9(L)  Platelets 150 - 400 K/uL 146(L) 173 152   CMP Latest Ref Rng & Units 10/25/2020 10/24/2020 10/23/2020  Glucose 70 - 99 mg/dL 209(H) 251(H) 156(H)  BUN 8 - 23 mg/dL 18 15 16   Creatinine 0.61 - 1.24 mg/dL 1.64(H) 1.60(H) 1.45(H)  Sodium 135 - 145 mmol/L 136 138 140  Potassium 3.5 - 5.1 mmol/L 3.9 3.6 3.5  Chloride 98 - 111 mmol/L 105 105 106  CO2 22 - 32 mmol/L 25 25 25   Calcium 8.9 -  10.3 mg/dL 8.2(L) 8.1(L) 8.4(L)  Total Protein 6.5 - 8.1 g/dL - - 5.7(L)  Total Bilirubin 0.3 - 1.2 mg/dL - - 0.6  Alkaline Phos 38 - 126 U/L - - 50  AST 15 - 41 U/L - - 17  ALT 0 - 44 U/L - - 10   Recent Labs    10/24/20 1214 10/24/20 1716 10/24/20 2054  GLUCAP 190* 169* 149*     Pertinent Imaging: VAS [name] LOWER EXTREMITY VENOUS (DVT)  Result Date: 10/24/2020  Lower Venous DVT Study Patient Name:  [name]  Date of Exam:   10/24/2020 Medical Rec #: [id number]         Accession #:    [id number] Date of Birth: [birth year]          Patient Gender: M Patient Age:   20 years Exam [facility] Procedure:      VAS [name] LOWER EXTREMITY VENOUS (DVT) Referring Phys: NISCHAL NARENDRA --------------------------------------------------------------------------------  Indications: Swelling. Other Indications: Necrotic wound to LLE post fall (10/10/20). Anticoagulation: Eliquis. Comparison Study: No previous exams Performing Technologist: Jody Hill RVT, RDMS  Examination Guidelines: A complete evaluation includes B-mode imaging, spectral Doppler, color Doppler, and power Doppler as needed of all accessible portions of each vessel. Bilateral testing is considered an integral part of a complete examination. Limited examinations for reoccurring indications may be performed as noted. The reflux portion of the exam is performed with the patient in reverse Trendelenburg.  +-----+---------------+---------+-----------+----------+--------------+ RIGHTCompressibilityPhasicitySpontaneityPropertiesThrombus Aging +-----+---------------+---------+-----------+----------+--------------+ CFV  Full           Yes      Yes                                 +-----+---------------+---------+-----------+----------+--------------+   +---------+---------------+---------+-----------+----------+--------------+ LEFT     CompressibilityPhasicitySpontaneityPropertiesThrombus Aging  +---------+---------------+---------+-----------+----------+--------------+ CFV      Full           Yes      Yes                                 +---------+---------------+---------+-----------+----------+--------------+ SFJ      Full                                                        +---------+---------------+---------+-----------+----------+--------------+  FV Prox  Full           Yes      Yes                                 +---------+---------------+---------+-----------+----------+--------------+ FV Mid   Full           Yes      Yes                                 +---------+---------------+---------+-----------+----------+--------------+ FV DistalFull           Yes      Yes                                 +---------+---------------+---------+-----------+----------+--------------+ PFV      Full                                                        +---------+---------------+---------+-----------+----------+--------------+ POP      Full           Yes      Yes                                 +---------+---------------+---------+-----------+----------+--------------+ PTV      Full                                                        +---------+---------------+---------+-----------+----------+--------------+ PERO     Full                                                        +---------+---------------+---------+-----------+----------+--------------+    Summary: RIGHT: - No evidence of common femoral vein obstruction.  LEFT: - There is no evidence of deep vein thrombosis in the lower extremity. - There is no evidence of superficial venous thrombosis.  Subcutaneous edema in left lower extremity.  *See table(s) above for measurements and observations.    Preliminary     ASSESSMENT/PLAN:  Assessment: Active Problems:   AMS (altered mental status)  [name] is a 75 y.o. male (he/him/his) with a pertinent PMH of persistent atrial  fibrillation (on Eliquis), complete heart block s/p PPM, grade II diastolic heart failure, hypertension, hypothyroidism, diabetes mellitus, CKD IIIb, who presents to [facility] with altered mental status.  HD 3  Plan:  # Acute Encephalopathy, resolved  # Memory Deficits Patient presented with altered mental status after being found in his car for >10 hours.  At this time, he is alert and oriented to person, place, and situation however there is evidence of lapses in memory and cognition.  It seems that orientation is also waxing and waning, as there are times he is unsure of where he is. I wonder if there  is an aspect of dementia, potentially vascular dementia.  - Will obtain MoCA  # LLE Necrotic Ulcer Secondary to mechanical fall 9/16. Due to poor wound healing in the setting of diabetes and vascular disease, abrasion developed into non-healing ulcer with necrosis. Ortho consulted in the ED; I&D showed hematoma. MRI negative for osteomyelitis. No formal ABIs in chart, however DP pulses are intact.   - Orthopedics consulted - Discontinue IV vancomycin and IV cefepime  - Start Augmentin and doxycycline - Follow-up wound culture  # Acute on chronic macrocytic anemia # Anemia of Chronic Disease Downtrend in hemoglobin over the last 3 months with no clear source. Despite macrocytic nature, folate and B12 are WNL. Patient is anti-coagulated increasing the risk for bleed, however iron panel suggests anemia of chronic disease.   - Trend hemoglobin  - Transfuse as needed for hemoglobin < 7 - Reticulocytes ordered for tomorrow AM  # Urinary Incontinence # Urinary Retention # Hx of BPH # Hx of ESBL Klebsiella UTI Per chart review, patient has a history of chronic urinary incontinence. Current complaints are concerning for overflow incontinence so PVR was obtained and elevated at 292 cc. Will obtain in/out and monitor closely.  Patient does have a history of ESBL Klebsiella UTI, however no dysuria.   Initial urine culture on admission shows multi species.  Will recollect.  - In/Out cath - Bladder scans Q8h - Continue tamsulosin 0.8 mg daily - PSA pending - Follow-up with urology outpatient - Repeat urine culture  # Recurrent Falls  # History of Peripheral Neuropathy  History of recurrent falls leading to injuries requiring numerous hospital visit, including admission from 8/27 - 9/6. Current wound is from fall shortly after discharge. PT/OT recommending SNF.   - SNF placement  #HFpEF (Grade II DD) Euvolemic on examination.  - Continue home Lasix 20mg  PO - Strict I&O, daily weights   #CKDIII Creatinine at baseline.   - Avoid nephrotoxic agents   #Type II Diabetes Mellitus HbA1c 7.8%.   - Levemir 5U + SSI - CBG monitoring  - Gabapentin 300 mg twice daily   #Persistent atrial fibrillation on Eliquis #Hx of complete heart block s/p PPM CHA2DS2VASc Score - 5 HASBLED Score- 2.   - Continue Amiodarone - Holding Eliquis at this time for severe anemia  # HTN  Blood pressure well controlled at this time  - Continue home hydralazine  # Hypothyroidism TSH elevated at 15 secondary to unintentional medicine noncompliance.  Patient was informed he must take Synthroid on an empty stomach.  - Continue Synthroid 175 mcg  Best Practice: Diet:  Cardiac and diabetic diet IVF: None,None VTE:  Held Code: Full Code  PT/OT recs: SNF  Prior to Admission Living Arrangement: Home  Anticipated Discharge Location: SNF  Barriers to Discharge: Decreased caregiver support, Medical stability  Dispo: Anticipated discharge in 1-2 days.  Signature: Dr. Jose Persia Internal Medicine PGY-3  Pager: [phone] After 5pm on weekdays and 1pm on weekends: On Call pager [phone]  10/25/2020, 6:36 AM

## 2020-10-25 NOTE — NC FL2 (Signed)
[place] LEVEL OF CARE SCREENING TOOL     IDENTIFICATION  Patient Name: [name] Birthdate: 08/10/1945 Sex: male Admission Date (Current Location): 10/21/2020  [facility] and [place] Number:  Herbalist and Address:  [facility], [place] [address], [place],  [address]      Provider Number: [id number]  Attending Physician Name and Address:  Angelica Pou, MD  Relative Name and Phone Number:  Jermey, Closs (Brother)   [phone]    Current Level of Care: Hospital Recommended Level of Care: [place] Prior Approval Number:    Date Approved/Denied:   PASRR Number: [id number]  Discharge Plan: SNF    Current Diagnoses: Patient Active Problem List   Diagnosis Date Noted   AMS (altered mental status) 10/22/2020   Cough 08/28/2020   COVID-19 virus infection 08/25/2020   Status post fall 08/24/2020   Status post biventricular pacemaker 08/24/2020   Ischemic heart disease due to coronary artery obstruction ([place]) 08/24/2020   Chronic diastolic heart failure ([place]) 08/24/2020   Bilateral lower extremity edema 08/24/2020   C7 cervical fracture ([place]) 08/07/2020   Pacemaker 08/07/2020   Generalized weakness 04/28/2020   Orthostatic hypotension 04/28/2020   Acute cystitis without hematuria    Exudative age-related macular degeneration of right eye with active choroidal neovascularization ([place]) 04/14/2020   Skin tear of left lower leg without complication 16/60/6301   Tinea cruris 04/06/2020   Hospital discharge follow-up 03/13/2020   Ambulatory dysfunction 03/05/2020   Essential tremor 03/05/2020   Ataxia 03/03/2020   Rash 01/03/2020   Lumbar stenosis with neurogenic claudication 11/14/2019   Stage 3b chronic kidney disease ([place]) 09/18/2019   Syncope and collapse 09/16/2019   Severe nonproliferative diabetic retinopathy of both eyes associated with type 2 diabetes mellitus ([place]) 08/27/2019    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 without macular edema ([place]) 08/27/2019   Posterior vitreous detachment of right eye 08/27/2019   Severe nonproliferative diabetic retinopathy of right eye ([place]) 08/27/2019   Frequency of urination and polyuria 01/29/2019   Coronary artery disease involving native coronary artery of native heart without angina pectoris 01/27/2019   Microalbuminuria 12/24/2018   COPD (chronic obstructive pulmonary disease) ([place]) 06/25/2018   Thrombocytopenia ([place]) 05/09/2018   Diabetic neuropathy ([place]) 01/30/2018   S/P AVR (aortic valve replacement) 11/04/2016   Atrial fibrillation ([place]) 09/27/2016   Symptomatic bradycardia 06/12/2016   Urgency incontinence 11/10/2015   Erectile dysfunction 11/10/2015   Acute lower UTI 05/23/2015   Hypothyroidism 01/15/2015   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   Phimosis 01/15/2015   CHF (congestive heart failure) ([place]) 01/01/2015   B12 deficiency 04/08/2014   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   Type 2 diabetes mellitus with diabetic neuropathy, unspecified ([place]) 03/12/2014   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   GERD (gastroesophageal reflux disease) 11/02/2012   Dysuria 08/28/2012   Anemia 04/02/2012   Obesity 08/04/2010   Benign prostatic hyperplasia 04/15/2010   CAROTID OCCLUSIVE DISEASE 01/26/2010   MURMUR 05/27/2009   Aortic valve disease 05/27/2009   Hyperlipidemia with target LDL less than 100 01/22/2009   Depression 01/22/2009   Essential hypertension 01/22/2009    Orientation RESPIRATION BLADDER Height & Weight     Self  Normal Incontinent, External catheter Weight: 238 lb 15.7 oz (108.4 kg) Height:     BEHAVIORAL SYMPTOMS/MOOD NEUROLOGICAL BOWEL NUTRITION STATUS      Continent Diet (See DC Summary)  AMBULATORY STATUS COMMUNICATION OF NEEDS Skin   Limited  Assist Verbally Normal                       Personal Care Assistance Level of Assistance  Bathing, Feeding,  Dressing Bathing Assistance: Limited assistance Feeding assistance: Independent Dressing Assistance: Limited assistance     Functional Limitations Info  Sight, Hearing, Speech Sight Info: Adequate Hearing Info: Adequate Speech Info: Adequate    SPECIAL CARE FACTORS FREQUENCY  PT (By licensed PT), OT (By licensed OT)     PT Frequency: 5x a week OT Frequency: 5x a week            Contractures Contractures Info: Not present    Additional Factors Info  Code Status, Allergies Code Status Info: Full Allergies Info: Phenothiazines   Actos (Pioglitazone)           Current Medications (10/25/2020):  This is the current hospital active medication list Current Facility-Administered Medications  Medication Dose Route Frequency Provider Last Rate Last Admin   acetaminophen (TYLENOL) tablet 650 mg  650 mg Oral Q6H PRN Aslam, Loralyn Freshwater, MD       Or   acetaminophen (TYLENOL) suppository 650 mg  650 mg Rectal Q6H PRN Aslam, Sadia, MD       amiodarone (PACERONE) tablet 200 mg  200 mg Oral Daily Aslam, Sadia, MD   200 mg at 10/25/20 0925   amoxicillin-clavulanate (AUGMENTIN) 875-125 MG per tablet 1 tablet  1 tablet Oral Q12H Weiskopf, Marin C, RPH       doxycycline (VIBRA-TABS) tablet 100 mg  100 mg Oral Q12H Jose Persia, MD       furosemide (LASIX) tablet 20 mg  20 mg Oral q morning Jose Persia, MD   20 mg at 10/25/20 0925   gabapentin (NEURONTIN) capsule 300 mg  300 mg Oral BID Harvie Heck, MD   300 mg at 10/25/20 0926   hydrALAZINE (APRESOLINE) tablet 25 mg  25 mg Oral Q8H Jose Persia, MD   25 mg at 10/24/20 2116   insulin aspart (novoLOG) injection 0-15 Units  0-15 Units Subcutaneous TID WC Harvie Heck, MD   3 Units at 10/25/20 1239   insulin detemir (LEVEMIR) injection 5 Units  5 Units Subcutaneous QHS Aslam, Loralyn Freshwater, MD   5 Units at 10/24/20 2125   levothyroxine (SYNTHROID) tablet 175 mcg  175 mcg Oral QAC breakfast Harvie Heck, MD   175 mcg at 10/24/20 0602   multivitamin  with minerals tablet 1 tablet  1 tablet Oral Daily Jose Persia, MD   1 tablet at 10/25/20 0925   ondansetron (ZOFRAN) tablet 4 mg  4 mg Oral Q6H PRN Aslam, Loralyn Freshwater, MD       Or   ondansetron (ZOFRAN) injection 4 mg  4 mg Intravenous Q6H PRN Aslam, Sadia, MD       pantoprazole (PROTONIX) EC tablet 40 mg  40 mg Oral QAC breakfast Aslam, Sadia, MD   40 mg at 10/25/20 0925   polyethylene glycol (MIRALAX / GLYCOLAX) packet 17 g  17 g Oral Daily Jose Persia, MD   17 g at 10/25/20 0924   senna-docusate (Senokot-S) tablet 1 tablet  1 tablet Oral QHS PRN Jose Persia, MD   1 tablet at 10/24/20 2118   simvastatin (ZOCOR) tablet 40 mg  40 mg Oral q1800 Aslam, Loralyn Freshwater, MD   40 mg at 10/24/20 1727   sodium chloride flush (NS) 0.9 % injection 3 mL  3 mL Intravenous Q12H Aslam, Loralyn Freshwater, MD   3 mL at 10/25/20 1120  tamsulosin (FLOMAX) capsule 0.8 mg  0.8 mg Oral QHS Harvie Heck, MD   0.8 mg at 10/24/20 2116     Discharge Medications: Please see discharge summary for a list of discharge medications.  Relevant Imaging Results:  Relevant Lab Results:   Additional Information SSN: [id number];  Reece Agar, Latanya Presser

## 2020-10-25 NOTE — Social Work (Signed)
CSW attempted to contact pt brother x2 with no answer, there is not an option to leave a VM. CSW will follow up with pt brother again to discuss DC planning. Pt is only oriented to person.

## 2020-10-26 ENCOUNTER — Inpatient Hospital Stay (HOSPITAL_COMMUNITY): Payer: No Typology Code available for payment source

## 2020-10-26 DIAGNOSIS — R4182 Altered mental status, unspecified: Secondary | ICD-10-CM | POA: Diagnosis not present

## 2020-10-26 DIAGNOSIS — L97925 Non-pressure chronic ulcer of unspecified part of left lower leg with muscle involvement without evidence of necrosis: Secondary | ICD-10-CM | POA: Insufficient documentation

## 2020-10-26 LAB — BASIC METABOLIC PANEL
Anion gap: 8 (ref 5–15)
BUN: 17 mg/dL (ref 8–23)
CO2: 23 mmol/L (ref 22–32)
Calcium: 8.4 mg/dL — ABNORMAL LOW (ref 8.9–10.3)
Chloride: 104 mmol/L (ref 98–111)
Creatinine, Ser: 1.84 mg/dL — ABNORMAL HIGH (ref 0.61–1.24)
GFR, Estimated: 37 mL/min — ABNORMAL LOW (ref >60)
Glucose, Bld: 211 mg/dL — ABNORMAL HIGH (ref 70–99)
Potassium: 3.8 mmol/L (ref 3.5–5.1)
Sodium: 135 mmol/L (ref 135–145)

## 2020-10-26 LAB — CBC WITH DIFFERENTIAL/PLATELET
Abs Immature Granulocytes: 0.06 10*3/uL (ref 0.00–0.07)
Basophils Absolute: 0 10*3/uL (ref 0.0–0.1)
Basophils Relative: 1 %
Eosinophils Absolute: 0.2 10*3/uL (ref 0.0–0.5)
Eosinophils Relative: 2 %
HCT: 24.3 % — ABNORMAL LOW (ref 39.0–52.0)
Hemoglobin: 7.8 g/dL — ABNORMAL LOW (ref 13.0–17.0)
Immature Granulocytes: 1 %
Lymphocytes Relative: 18 %
Lymphs Abs: 1.2 10*3/uL (ref 0.7–4.0)
MCH: 32.4 pg (ref 26.0–34.0)
MCHC: 32.1 g/dL (ref 30.0–36.0)
MCV: 100.8 fL — ABNORMAL HIGH (ref 80.0–100.0)
Monocytes Absolute: 0.7 10*3/uL (ref 0.1–1.0)
Monocytes Relative: 11 %
Neutro Abs: 4.3 10*3/uL (ref 1.7–7.7)
Neutrophils Relative %: 67 %
Platelets: 148 10*3/uL — ABNORMAL LOW (ref 150–400)
RBC: 2.41 MIL/uL — ABNORMAL LOW (ref 4.22–5.81)
RDW: 20.8 % — ABNORMAL HIGH (ref 11.5–15.5)
WBC: 6.4 10*3/uL (ref 4.0–10.5)
nRBC: 0 % (ref 0.0–0.2)

## 2020-10-26 LAB — URINE CULTURE: Culture: 20000 — AB

## 2020-10-26 LAB — GLUCOSE, CAPILLARY
Glucose-Capillary: 170 mg/dL — ABNORMAL HIGH (ref 70–99)
Glucose-Capillary: 141 mg/dL — ABNORMAL HIGH (ref 70–99)
Glucose-Capillary: 266 mg/dL — ABNORMAL HIGH (ref 70–99)

## 2020-10-26 LAB — KAPPA/LAMBDA LIGHT CHAINS
Kappa free light chain: 80.5 mg/L — ABNORMAL HIGH (ref 3.3–19.4)
Kappa, lambda light chain ratio: 2.18 — ABNORMAL HIGH (ref 0.26–1.65)
Lambda free light chains: 36.9 mg/L — ABNORMAL HIGH (ref 5.7–26.3)

## 2020-10-26 LAB — RETICULOCYTES
Immature Retic Fract: 32.6 % — ABNORMAL HIGH (ref 2.3–15.9)
RBC.: 2.41 MIL/uL — ABNORMAL LOW (ref 4.22–5.81)
Retic Count, Absolute: 90.4 10*3/uL (ref 19.0–186.0)
Retic Ct Pct: 3.8 % — ABNORMAL HIGH (ref 0.4–3.1)

## 2020-10-26 MED ORDER — SODIUM CHLORIDE 0.9 % IV SOLN
2.0000 g | Freq: Two times a day (BID) | INTRAVENOUS | Status: DC
  Administered 2020-10-26 – 2020-10-29 (×6): 2 g via INTRAVENOUS
  Filled 2020-10-26 (×7): qty 2

## 2020-10-26 MED ORDER — VANCOMYCIN HCL IN DEXTROSE 1-5 GM/200ML-% IV SOLN
1000.0000 mg | INTRAVENOUS | Status: DC
  Administered 2020-10-26 – 2020-10-28 (×3): 1000 mg via INTRAVENOUS
  Filled 2020-10-26 (×3): qty 200

## 2020-10-26 MED ORDER — HEPARIN SODIUM (PORCINE) 5000 UNIT/ML IJ SOLN
5000.0000 [IU] | Freq: Three times a day (TID) | INTRAMUSCULAR | Status: AC
  Administered 2020-10-26 – 2020-10-27 (×3): 5000 [IU] via SUBCUTANEOUS
  Filled 2020-10-26 (×3): qty 1

## 2020-10-26 NOTE — Progress Notes (Signed)
HD 4 SUBJECTIVE:  Patient Summary: [name] is a 75 y.o. male (he/him/his) with a pertinent PMH of persistent atrial fibrillation (on Eliquis), complete heart block s/p PPM, grade II HFpEF, hypertension, hypothyroidism, diabetes mellitus, CKD IIIb, who presents to [facility] with altered mental status.   Overnight Events: None. Patient evaluated at bedside this AM, was seen sitting up in his chair. A&Ox 2, person and place only. Reported that he is in the hospital because of a fall that injured his leg, stated that it was the year 2027, and stated that he does not remember blood transfusion yesterday.  Patient stated his feet were feeling cold. Stated he felt his sugar was dropping and was asking for gum. Patient knew he was in [place] and in a hospital but stated it was 2027. Denied acute concerns.   OBJECTIVE:  Vital Signs: Vitals:   10/26/20 0442 10/26/20 0500 10/26/20 0729 10/26/20 0841  BP: (!) 153/72  (!) 156/71 137/60  Pulse: 76  64 66  Resp:   19 18  Temp: 98.5 F (36.9 C)  98 F (36.7 C) (!) 97.5 F (36.4 C)  TempSrc: Oral   Oral  SpO2: 97%  98% 100%  Weight:  107.9 kg     Supplemental O2: Room Air SpO2: 100 %  Filed Weights   10/24/20 0350 10/25/20 0350 10/26/20 0500  Weight: 106.9 kg 108.4 kg 107.9 kg     Intake/Output Summary (Last 24 hours) at 10/26/2020 1526 Last data filed at 10/26/2020 1000 Gross per 24 hour  Intake 203 ml  Output 1050 ml  Net -847 ml   Net IO Since Admission: 352.67 mL [10/26/20 1526]  Physical Exam: Physical Exam Vitals and nursing note reviewed.  Constitutional:      General: He is not in acute distress.    Appearance: He is not ill-appearing.  HENT:     Head: Normocephalic and atraumatic.     Mouth/Throat:     Mouth: Mucous membranes are moist.     Pharynx: Oropharynx is clear.  Eyes:     Extraocular Movements: Extraocular movements intact.     Pupils: Pupils are equal, round, and reactive to light.  Cardiovascular:     Rate  and Rhythm: Normal rate and regular rhythm.     Pulses: Normal pulses.          Dorsalis pedis pulses are 2+ on the right side and 2+ on the left side.  Pulmonary:     Effort: Pulmonary effort is normal. No respiratory distress.  Abdominal:     General: There is no distension.     Palpations: Abdomen is soft.     Tenderness: There is no abdominal tenderness. There is no guarding.  Musculoskeletal:        General: No tenderness.     Cervical back: Normal range of motion.     Right lower leg: No edema.     Left lower leg: No edema.     Comments: Left lower extremity necrotic ulcer with some sanguinous drainage.  No migration of erythema.  No foul odor.  No purulent drainage.  Skin:    General: Skin is warm and dry.  Neurological:     Mental Status: He is alert. He is disoriented.     Cranial Nerves: No cranial nerve deficit or dysarthria.     Sensory: Sensation is intact.     Comments: A&Ox2 - person, place only, see above for more details   Psychiatric:  Attention and Perception: Attention and perception normal.        Mood and Affect: Mood and affect normal.        Behavior: Behavior normal.        Cognition and Memory: Memory is impaired.   Pertinent Labs: CBC Latest Ref Rng & Units 10/26/2020 10/25/2020 10/24/2020  WBC 4.0 - 10.5 K/uL 6.4 7.0 7.8  Hemoglobin 13.0 - 17.0 g/dL 7.8(L) 7.4(L) 9.0(L)  Hematocrit 39.0 - 52.0 % 24.3(L) 23.6(L) 27.2(L)  Platelets 150 - 400 K/uL 148(L) 146(L) 173   CMP Latest Ref Rng & Units 10/26/2020 10/25/2020 10/24/2020  Glucose 70 - 99 mg/dL 211(H) 209(H) 251(H)  BUN 8 - 23 mg/dL 17 18 15   Creatinine 0.61 - 1.24 mg/dL 1.84(H) 1.64(H) 1.60(H)  Sodium 135 - 145 mmol/L 135 136 138  Potassium 3.5 - 5.1 mmol/L 3.8 3.9 3.6  Chloride 98 - 111 mmol/L 104 105 105  CO2 22 - 32 mmol/L 23 25 25   Calcium 8.9 - 10.3 mg/dL 8.4(L) 8.2(L) 8.1(L)  Total Protein 6.5 - 8.1 g/dL - - -  Total Bilirubin 0.3 - 1.2 mg/dL - - -  Alkaline Phos 38 - 126 U/L - - -   AST 15 - 41 U/L - - -  ALT 0 - 44 U/L - - -   Recent Labs    10/25/20 2236 10/26/20 0732 10/26/20 1348  GLUCAP 193* 170* 141*    Pertinent Imaging: US RENAL  Result Date: 10/26/2020 CLINICAL DATA:  Urinary retention EXAM: RENAL / URINARY TRACT ULTRASOUND COMPLETE COMPARISON:  None. FINDINGS: Right Kidney: Renal measurements: 11.5 x 4.1 x 4.0 cm = volume: 112 mL. Increased cortical echogenicity. No mass or hydronephrosis visualized. Left Kidney: Renal measurements: 11.3 x 5.0 x 4.5 cm = volume: 136 mL. Increased cortical echogenicity. No mass or hydronephrosis visualized. Bladder: Appears normal for degree of bladder distention. Bilateral ureter jets visualized. Other: Left pleural effusion. IMPRESSION: No hydronephrosis. Increased cortical echogenicity, findings can be seen in the setting of medical renal disease. Electronically Signed   By: Yetta Glassman M.D.   On: 10/26/2020 11:59    ASSESSMENT/PLAN:  Assessment: Active Problems:   AMS (altered mental status)   Ulcer of left lower extremity with muscle involvement without evidence of necrosis (HCC)  [name] is a 75 y.o. male (he/him/his) with a pertinent PMH of persistent atrial fibrillation (on Eliquis), complete heart block s/p PPM, grade II diastolic heart failure, hypertension, hypothyroidism, diabetes mellitus, CKD IIIb, who presents to [facility] with altered mental status.  HD 4  Plan: # Acute Encephalopathy, resolved  # Memory Deficits Patient presented with altered mental status after being found in his car for >10 hours likely secondary to possible infection, hypothyroidism, urinary retention. At this time, he is alert and oriented to person and place only. He was not able to recall recent blood transfusion on 10/1. See subjective for additional details. It seems that orientation waxes and wanes, as there are times he is unsure of where he is, this could possibly be due to delirium. Considering an aspect of dementia,  potentially vascular dementia.  We will have patient follow up outpatient. - Will obtain MMSE  # LLE Necrotic Ulcer Secondary to mechanical fall 9/16. Due to poor wound healing in the setting of diabetes and vascular disease, abrasion developed into non-healing ulcer with necrosis. Ortho consulted in the ED; I&D showed hematoma. MRI negative for osteomyelitis. No formal ABIs in chart, however DP pulses are intact. Wound culture, no  growth in 4 days. Planned for possible debridement in the OR on 10/5 with ortho. - Orthopedics consulted - Continue IV vancomycin and cefepime - OR with ortho on 10/5 for possible debridement   # Acute on chronic macrocytic anemia # Anemia of Chronic Disease Downtrend in hemoglobin over the last 3 months with no clear source. Despite macrocytic nature, folate and B12 are WNL. Patient is anti-coagulated increasing the risk for bleed, however iron panel suggests anemia of chronic disease. Reticulocyte revealed inappropriately normal count of 2.2, suggesting an inadequate compensation for anemia, possibly secondary to myelodysplastic syndrome.  - Trend hemoglobin  - Transfuse as needed for hemoglobin < 7  # Urinary Incontinence # Urinary Retention # Hx of BPH # Hx of ESBL Klebsiella UTI Per chart review, patient has a history of chronic urinary incontinence possibly secondary to BPH, urethral strictures, UTI. Current complaints are concerning for overflow incontinence so PVR was obtained and elevated at 292 cc. History of multiple urological procedures resulting in urethral strictures. Renal ultrasound showed no hydronephrosis, but did show increased renal cortical echogenicity and left pleural effusion. Patient does have a history of ESBL Klebsiella UTI, however no dysuria. Repeat urine culture shows yeast colonization, likely chronic colonization since he denies symptoms of UTI.  We will continue to hold starting antibiotics, in the setting of asymptomatic funguria . -  In/Out cath as needed - Bladder scans Q8h - Continue tamsulosin 0.8 mg daily - Follow-up with urology outpatient  # Recurrent Falls  # History of Peripheral Neuropathy  History of recurrent falls leading to injuries requiring numerous hospital visit, including admission from 8/27 - 9/6. Current wound is from fall shortly after discharge. PT/OT recommending SNF.  -Continue gabapentin 300 mg twice daily -SNF placement  #HFpEF (Grade II DD) Euvolemic on examination. - Continue home Lasix 20mg  PO - Strict I&O, daily weights   #CKDIII Baseline Cr 1.5. Elevated at 1.8 on 10/3. - Avoid nephrotoxic agents   #Type II Diabetes Mellitus HbA1c 7.8%.  - Levemir 5U + SSI - CBG monitoring  - Gabapentin 300 mg twice daily - Will have patient follow-up outpatient for more optimal medical management and encourage lifestyle modification   #Persistent atrial fibrillation on Eliquis #Hx of complete heart block s/p PPM CHA2DS2VASc Score - 5 HASBLED Score- 2.  - Continue Amiodarone - Holding Eliquis at this time for severe anemia  # HTN  Blood pressure well controlled at this time - Continue home hydralazine  # Hypothyroidism TSH elevated at 15 secondary to unintentional medicine noncompliance.  Patient was informed he must take Synthroid on an empty stomach. Will hav - Continue Synthroid 175 mcg  Best Practice: Diet:  Cardiac and diabetic diet IVF: None,None VTE:  Held Code: Full Code  PT/OT recs: SNF  Prior to Admission Living Arrangement: Home  Anticipated Discharge Location: SNF  Barriers to Discharge: Decreased caregiver support, IV cefepime and vancomycin   Dispo: Anticipated discharge in 1-2 days.  Signature: Merrily Brittle, DO Psychiatry Resident, PGY-1 [facility] Teaching Service Pager: [phone] After 5pm on weekdays and 1pm on weekends: On Call pager [phone]  10/26/2020, 3:26 PM

## 2020-10-26 NOTE — Progress Notes (Signed)
Occupational Therapy Treatment Patient Details Name: [name] MRN: [id number] DOB: [birth year] Today's Date: 10/26/2020   History of present illness 75 y.o. M admitted on 10/22/20 due to AMS. On 9/30 pt had an I&D of LLE abcess. Past medical history of A. 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   OT comments  Pt in bed upon arrival. Pt agreeable to OT session. He currently requires minguard for bed mobility and minA at RW level for functional mobility. Pt completed grooming while standing at sink level with minA for stability and safety. Pt with decreased safety awareness and noted to have increased water on the counter and floor. Pt continues to demonstrate cognitive limitations impacting safety and independence with ADL/IADL and functional mobility. Continue to recommend follow-up therapy at SNF level to progress toward prior level of functioning. Will continue to follow acutely.    Recommendations for follow up therapy are one component of a multi-disciplinary discharge planning process, led by the attending physician.  Recommendations may be updated based on patient status, additional functional criteria and insurance authorization.    Follow Up Recommendations  SNF    Equipment Recommendations  Other (comment) (RW)    Recommendations for Other Services      Precautions / Restrictions Precautions Precautions: Fall Restrictions Weight Bearing Restrictions: No       Mobility Bed Mobility Overal bed mobility: Needs Assistance Bed Mobility: Supine to Sit     Supine to sit: Min guard     General bed mobility comments: minguard for safety, pt progressed to EOB with HOB elevated increased time and effort, not physical assist needed from OT    Transfers Overall transfer level: Needs assistance Equipment used: None Transfers: Sit to/from Stand Sit  to Stand: Min assist;Mod assist         General transfer comment: minA to powerup into standing from lower surface    Balance Overall balance assessment: Needs assistance Sitting-balance support: Feet supported;Single extremity supported Sitting balance-Leahy Scale: Fair     Standing balance support: No upper extremity supported;During functional activity Standing balance-Leahy Scale: Poor Standing balance comment: reliant on at least single UE support in standing, while engaging in grooming at sink level pt with no UE support but bracing with hips on counter                           ADL either performed or assessed with clinical judgement   ADL Overall ADL's : Needs assistance/impaired     Grooming: Min guard;Standing Grooming Details (indicate cue type and reason): cues for safety, pt washed face and hands while standing at sink level. Pt with increased water on sink and floor, cues for safety and assist to dry water.         Upper Body Dressing : Min guard;Sitting   Lower Body Dressing: Moderate assistance;Sit to/from stand Lower Body Dressing Details (indicate cue type and reason): assist to access BLE and for stability in standing without UE support Toilet Transfer: Minimal assistance;Ambulation Toilet Transfer Details (indicate cue type and reason): pt ambulated from EOB to sink with minA for safety and stability Toileting- Clothing Manipulation and Hygiene: Minimal assistance;Sit to/from stand Toileting - Clothing Manipulation Details (indicate cue type and reason): minA to powerup into standing from a lower surface     Functional mobility during ADLs: Minimal assistance;Rolling walker General ADL Comments: pt required  increased     Vision   Vision Assessment?: No apparent visual deficits   Perception     Praxis      Cognition Arousal/Alertness: Awake/alert Behavior During Therapy: Impulsive Overall Cognitive Status: Impaired/Different from  baseline Area of Impairment: Orientation;Memory;Safety/judgement;Awareness;Problem solving                 Orientation Level: Disoriented to;Time;Situation   Memory: Decreased recall of precautions;Decreased short-term memory   Safety/Judgement: Decreased awareness of safety;Decreased awareness of deficits Awareness: Emergent Problem Solving: Slow processing General Comments: Pt oriented to self and hospital, pt able to recall reason for admission, became emotionally labile when talking about being stuck on the highway and noone stopped for him. Pt with tangential thoughts throughout session, was able to redirect. Pt with poor safety awareness and poor problem solving skills. recommend increased assistance for higher cognitive tasks during IADL completion.        Exercises     Shoulder Instructions       General Comments vss, dressing intact with no visable drainage    Pertinent Vitals/ Pain       Pain Assessment: No/denies pain  Home Living                                          Prior Functioning/Environment              Frequency  Min 2X/week        Progress Toward Goals  OT Goals(current goals can now be found in the care plan section)  Progress towards OT goals: Progressing toward goals  Acute Rehab OT Goals Patient Stated Goal: To go home OT Goal Formulation: With patient Time For Goal Achievement: 11/07/20 Potential to Achieve Goals: Good ADL Goals Pt Will Perform Grooming: with modified independence;standing Pt Will Perform Lower Body Bathing: with modified independence;sitting/lateral leans;sit to/from stand Pt Will Perform Lower Body Dressing: with modified independence;sitting/lateral leans;sit to/from stand Pt Will Transfer to Toilet: with modified independence;ambulating Pt Will Perform Toileting - Clothing Manipulation and hygiene: with modified independence;sitting/lateral leans;sit to/from stand  Plan Discharge plan  remains appropriate    Co-evaluation                 AM-PAC OT "6 Clicks" Daily Activity     Outcome Measure   Help from another person eating meals?: A Little Help from another person taking care of personal grooming?: A Little Help from another person toileting, which includes using toliet, bedpan, or urinal?: A Little Help from another person bathing (including washing, rinsing, drying)?: A Lot Help from another person to put on and taking off regular upper body clothing?: A Little Help from another person to put on and taking off regular lower body clothing?: A Lot 6 Click Score: 16    End of Session Equipment Utilized During Treatment: Gait belt;Rolling walker  OT Visit Diagnosis: Unsteadiness on feet (R26.81);Other abnormalities of gait and mobility (R26.89);Muscle weakness (generalized) (M62.81)   Activity Tolerance Patient tolerated treatment well   Patient Left with call bell/phone within reach;in chair;with chair alarm set   Nurse Communication Mobility status        Time: 4193-7902 OT Time Calculation (min): 17 min  Charges: OT General Charges $OT Visit: 1 Visit OT Treatments $Self Care/Home Management : 8-22 mins  Helene Kelp OTR/L Acute Rehabilitation Services Office: [place] 10/26/2020, 11:23 AM

## 2020-10-26 NOTE — Consult Note (Signed)
ORTHOPAEDIC CONSULTATION  REQUESTING PHYSICIAN: Angelica Pou, MD  Chief Complaint: Traumatic wound left leg.  HPI: [name] is a 75 y.o. male who presents with a hematoma anterior lateral left leg.  Patient underwent initial debridement in the emergency room and findings at that time were consistent with hematoma.  The cultures remain negative after 2 days.  Patient has multiple medical problems.  Patient states that he bumped his leg on the car door.  Social history patient states that he does drive a car and states that he lives home alone.  Past Medical History:  Diagnosis Date   Arthritis    Atrial fibrillation (HCC)    BPH (benign prostatic hypertrophy) 04/15/2010   Cataract    Colon polyps    DEPRESSION 01/22/2009   Heart valve replaced    HYPERCHOLESTEROLEMIA 01/22/2009   HYPERTENSION 01/22/2009   Dr. 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/22/2009   IDDM 01/22/2009   Morbid obesity ([place])    Severe nonproliferative diabetic retinopathy of both eyes ([place]) 08/27/2019   Stage 3b chronic kidney disease ([place]) 09/18/2019   Tremors of nervous system    ?ptsd   Ulcer    Vitreous hemorrhage of left eye ([place]) 08/27/2019   Past Surgical History:  Procedure Laterality Date   AORTIC VALVE REPLACEMENT  July 2006   #20 stentless [place] porcine valve   APPENDECTOMY     bilateral cataract surg     BIOPSY  05/01/2020   Procedure: BIOPSY;  Surgeon: Eloise Harman, DO;  Location: [facility];  Service: Endoscopy;;   COLONOSCOPY  04/24/2007   [name]: normal   COLONOSCOPY WITH ESOPHAGOGASTRODUODENOSCOPY (EGD) N/A 04/05/2012   normal rectum, somewhat elongated and redundant colon, otherwise normal. Remote history of colon polyps. EGD: normal esophagus, bile-stained gastric mucosa, diffuse patchy gastric erythema, small duodenal bulbar ulcer with associated erosions. Negative H.pylori. H.pylori serology also negative.    COLONOSCOPY WITH PROPOFOL N/A 05/01/2020    Procedure: COLONOSCOPY WITH PROPOFOL;  Surgeon: Eloise Harman, DO;  Location: [facility];  Service: Endoscopy;  Laterality: N/A;   DENTAL SURGERY  05/2004   Dental extractions   ESOPHAGOGASTRODUODENOSCOPY (EGD) WITH PROPOFOL N/A 05/01/2020   Procedure: ESOPHAGOGASTRODUODENOSCOPY (EGD) WITH PROPOFOL;  Surgeon: Eloise Harman, DO;  Location: [facility];  Service: Endoscopy;  Laterality: N/A;   EYE SURGERY Bilateral    cataract   HEEL SPUR SURGERY Bilateral    resection of heel spur   KNEE ARTHROSCOPY WITH LATERAL MENISECTOMY Right 04/04/2014   Procedure: KNEE ARTHROSCOPY WITH LATERAL MENISECTOMY;  Surgeon: Carole Civil, MD;  Location: [facility];  Service: Orthopedics;  Laterality: Right;   PACEMAKER IMPLANT N/A 06/13/2016   Procedure: Pacemaker Implant- Dual Chamber;  Surgeon: Evans Lance, MD;  Location: [facility];  Service: Cardiovascular;  Laterality: N/A;   PACEMAKER INSERTION  2018   THYROIDECTOMY  03/21/2013   DR [name]   THYROIDECTOMY N/A 03/21/2013   Procedure: THYROIDECTOMY;  Surgeon: [name], MD;  Location[facility];  Service: General;  Laterality: N/A;   TONSILLECTOMY     Social History   Socioeconomic History   Marital status: Single    Spouse name: Not on file   Number of children: 1   Years of education: HS   Highest education level: Not on file  Occupational History   Occupation: Truck Geophysicist/field seismologist, retired    Fish farm manager: RETIRED  Tobacco Use   Smoking status: Former    Packs/day: 0.00  Years: 12.00    Pack years: 0.00    Types: Cigarettes    Quit date: 09/20/1961    Years since quitting: 59.1   Smokeless tobacco: Never  Vaping Use   Vaping Use: Never used  Substance and Sexual Activity   Alcohol use: Not Currently   Drug use: No   Sexual activity: Never    Comment: Has not smoked in 20 years  Other Topics Concern   Not on file  Social History Narrative   Lives alone.   Quit smoking approximately 28 years ago.   Long haul truck  driver, lives in [place].   Social Determinants of Health   Financial Resource Strain: Not on file  Food Insecurity: Not on file  Transportation Needs: Not on file  Physical Activity: Not on file  Stress: Not on file  Social Connections: Not on file   Family History  Problem Relation Age of Onset   Heart disease Father    Congestive Heart Failure Father    Arthritis Father    Diabetes Other    Benign prostatic hyperplasia Brother    Colon cancer Neg Hx    Colon polyps Neg Hx    - negative except otherwise stated in the family history section Allergies  Allergen Reactions   Phenothiazines Anaphylaxis   Actos [Pioglitazone] Swelling   Prior to Admission medications   Medication Sig Start Date End Date Taking? Authorizing Provider  acetaminophen (TYLENOL) 500 MG tablet Take 1,000 mg by mouth every 6 (six) hours as needed (pain).    [provider]  albuterol (VENTOLIN HFA) 108 (90 Base) MCG/ACT inhaler Inhale 2 puffs into the lungs every 4 (four) hours as needed for shortness of breath or wheezing. 08/30/20   [provider]  amiodarone (PACERONE) 200 MG tablet Take 200 mg by mouth daily. 08/21/20   [provider]  apixaban (ELIQUIS) 5 MG TABS tablet Take 1 tablet (5 mg total) by mouth 2 (two) times daily. 02/17/20   Dettinger, Fransisca Kaufmann, MD  azithromycin (ZITHROMAX) 250 MG tablet Take 250-500 mg by mouth See admin instructions. Take 500mg  by mouth on Day 1, then take 250mg  daily on Days 2-5. 08/30/20   [provider]  bacitracin ointment Apply 1 application topically 2 (two) times daily. Start applying antibiotic ointment starting tomorrow 10/10/20   Varney Biles, MD  cholecalciferol (VITAMIN D3) 25 MCG (1000 UNIT) tablet Take 1,000 Units by mouth every morning.    [provider]  dexamethasone (DECADRON) 2 MG tablet Take 6 mg by mouth every morning. 08/26/20   [provider]  diclofenac Sodium (VOLTAREN) 1 % GEL Apply 2 g  topically 4 (four) times daily as needed (pain). Do not take ibuprofen or aleve while using this gel    [provider]  doxycycline (VIBRAMYCIN) 100 MG capsule Take 100 mg by mouth 2 (two) times daily. For 5 days    [provider]  furosemide (LASIX) 20 MG tablet Take 20 mg by mouth every morning.    [provider]  gabapentin (NEURONTIN) 300 MG capsule Take 1 capsule (300 mg total) by mouth 2 (two) times daily. 02/17/20   Dettinger, Fransisca Kaufmann, MD  guaiFENesin (MUCINEX) 600 MG 12 hr tablet Take 1,200 mg by mouth 2 (two) times daily. 08/26/20   [provider]  hydrALAZINE (APRESOLINE) 25 MG tablet Take 25 mg by mouth every 8 (eight) hours. 09/21/20   [provider]  hydrocortisone cream 1 % Apply 1 application topically  2 (two) times daily. 04/06/20   Ivy Lynn, NP  insulin detemir (LEVEMIR FLEXTOUCH) 100 UNIT/ML FlexPen Inject 5 Units into the skin daily. 05/02/20   Hongalgi, Lenis Dickinson, MD  levothyroxine (SYNTHROID) 175 MCG tablet Take 1 tablet (175 mcg total) by mouth daily before breakfast. 02/17/20   Dettinger, Fransisca Kaufmann, MD  loperamide (IMODIUM A-D) 2 MG tablet Take 1 tablet (2 mg total) by mouth 4 (four) times daily as needed for diarrhea or loose stools. 03/19/20   Dettinger, Fransisca Kaufmann, MD  metFORMIN (GLUCOPHAGE) 1000 MG tablet TAKE 1 TABLET BY MOUTH TWICE DAILY WITH A MEAL. Patient taking differently: Take 1,000 mg by mouth in the morning and at bedtime. 07/24/15   Timmothy Euler, MD  Omega-3 Fatty Acids (FISH OIL) 1000 MG CAPS Take 1,000 mg by mouth 2 (two) times daily.    [provider]  pantoprazole (PROTONIX) 40 MG tablet Take 1 tablet (40 mg total) by mouth daily before breakfast. 05/03/20   Hongalgi, Lenis Dickinson, MD  predniSONE (DELTASONE) 20 MG tablet 2 po at same time daily for 5 days Patient taking differently: Take 40 mg by mouth daily. 2 po at same time daily for 5 days 07/23/20   Dettinger, Fransisca Kaufmann, MD  Semaglutide, 1 MG/DOSE,  (OZEMPIC, 1 MG/DOSE,) 2 MG/1.5ML SOPN Inject 1 mg into the skin once a week. 02/17/20   Dettinger, Fransisca Kaufmann, MD  simvastatin (ZOCOR) 40 MG tablet Take 40 mg by mouth at bedtime. For cholesterol    [provider]  simvastatin (ZOCOR) 80 MG tablet Take 0.5 tablets (40 mg total) by mouth at bedtime. Patient not taking: Reported on 10/23/2020 04/04/17   Timmothy Euler, MD  tamsulosin (FLOMAX) 0.4 MG CAPS capsule TAKE TWO CAPSULES BY MOUTH AT BEDTIME Patient taking differently: Take 0.8 mg by mouth daily after supper. 07/09/20   Dettinger, Fransisca Kaufmann, MD  vitamin B-12 (CYANOCOBALAMIN) 500 MCG tablet Take 500 mcg by mouth daily. 05/02/20   [provider]   VAS [name] LOWER EXTREMITY VENOUS (DVT)  Result Date: 10/25/2020  Lower Venous DVT Study Patient Name:  TRISTIAN SICKINGER  Date of Exam:   10/24/2020 Medical Rec #: [id number]         Accession #:    [id number] Date of Birth: [birth year]          Patient Gender: M Patient Age:   33 years Exam Location:  [facility] Procedure:      VAS [name] LOWER EXTREMITY VENOUS (DVT) Referring Phys: NISCHAL NARENDRA --------------------------------------------------------------------------------  Indications: Swelling. Other Indications: Necrotic wound to LLE post fall (10/10/20). Anticoagulation: Eliquis. Comparison Study: No previous exams Performing Technologist: Jody Hill RVT, RDMS  Examination Guidelines: A complete evaluation includes B-mode imaging, spectral Doppler, color Doppler, and power Doppler as needed of all accessible portions of each vessel. Bilateral testing is considered an integral part of a complete examination. Limited examinations for reoccurring indications may be performed as noted. The reflux portion of the exam is performed with the patient in reverse Trendelenburg.  +-----+---------------+---------+-----------+----------+--------------+ RIGHTCompressibilityPhasicitySpontaneityPropertiesThrombus Aging  +-----+---------------+---------+-----------+----------+--------------+ CFV  Full           Yes      Yes                                 +-----+---------------+---------+-----------+----------+--------------+   +---------+---------------+---------+-----------+----------+--------------+ LEFT     CompressibilityPhasicitySpontaneityPropertiesThrombus Aging +---------+---------------+---------+-----------+----------+--------------+ CFV      Full  Yes      Yes                                 +---------+---------------+---------+-----------+----------+--------------+ SFJ      Full                                                        +---------+---------------+---------+-----------+----------+--------------+ FV Prox  Full           Yes      Yes                                 +---------+---------------+---------+-----------+----------+--------------+ FV Mid   Full           Yes      Yes                                 +---------+---------------+---------+-----------+----------+--------------+ FV DistalFull           Yes      Yes                                 +---------+---------------+---------+-----------+----------+--------------+ PFV      Full                                                        +---------+---------------+---------+-----------+----------+--------------+ POP      Full           Yes      Yes                                 +---------+---------------+---------+-----------+----------+--------------+ PTV      Full                                                        +---------+---------------+---------+-----------+----------+--------------+ PERO     Full                                                        +---------+---------------+---------+-----------+----------+--------------+    Summary: RIGHT: - No evidence of common femoral vein obstruction.  LEFT: - There is no evidence of deep vein thrombosis in the lower  extremity. - There is no evidence of superficial venous thrombosis.  Subcutaneous edema in left lower extremity.  *See table(s) above for measurements and observations. Electronically signed by Harold Barban MD on 10/25/2020 at 9:33:14 AM.    Final    - pertinent xrays, CT, MRI studies were reviewed and independently interpreted  Positive ROS: All other systems have been reviewed  and were otherwise negative with the exception of those mentioned in the HPI and as above.  Physical Exam: General: Alert, no acute distress Psychiatric: Patient is competent for consent with normal mood and affect Lymphatic: No axillary or cervical lymphadenopathy Cardiovascular: No pedal edema Respiratory: No cyanosis, no use of accessory musculature GI: No organomegaly, abdomen is soft and non-tender    Images:  @ENCIMAGES @  Labs:  Lab Results  Component Value Date   HGBA1C 7.8 (H) 10/23/2020   HGBA1C 8.1 (H) 02/17/2020   HGBA1C 7.3 (H) 10/02/2019   ESRSEDRATE 26 (H) 08/16/2017   ESRSEDRATE 22 10/19/2012   CRP <0.8 08/16/2017   LABURIC 5.8 10/19/2012   REPTSTATUS 10/25/2020 FINAL 10/23/2020   GRAMSTAIN  10/23/2020    FEW WBC PRESENT,BOTH PMN AND MONONUCLEAR NO ORGANISMS SEEN    CULT  10/23/2020    NO GROWTH 2 DAYS Performed at [facility], [place] [address] [place], [place] [address]    LABORGA KLEBSIELLA PNEUMONIAE (A) 06/27/2020    Lab Results  Component Value Date   ALBUMIN 2.7 (L) 10/23/2020   ALBUMIN 3.1 (L) 10/21/2020   ALBUMIN 3.1 (L) 10/16/2020   LABURIC 5.8 10/19/2012     CBC EXTENDED Latest Ref Rng & Units 10/26/2020 10/26/2020 10/25/2020  WBC 4.0 - 10.5 K/uL 6.4 - 7.0  RBC 4.22 - 5.81 MIL/uL 2.41(L) 2.41(L) 2.33(L)  HGB 13.0 - 17.0 g/dL 7.8(L) - 7.4(L)  HCT 39.0 - 52.0 % 24.3(L) - 23.6(L)  PLT 150 - 400 K/uL 148(L) - 146(L)  NEUTROABS 1.7 - 7.7 K/uL 4.3 - 4.7  LYMPHSABS 0.7 - 4.0 K/uL 1.2 - 1.3    Neurologic: Patient does not have protective sensation bilateral  lower extremities.   MUSCULOSKELETAL:   Skin: Examination there is an open traumatic hematoma wound anterior lateral aspect of left leg this is 5 x 3 cm and approximately 2 cm deep.  There is black eschar around the wound there is no cellulitis.  There is no calf tenderness to palpation.  Patient has palpable dorsalis pedis pulses bilaterally.  No arterial insufficiency.  Patient's white cell count is 6.4 hemoglobin 7.8.  Albumin 2.7.  Hemoglobin A1c 7.8.  Assessment: Assessment: Traumatic blunt trauma will left leg with hematoma status post decompression with open wound.  Plan: We will plan for surgical debridement and wound closure on Wednesday.  Continue IV antibiotics until cultures are finalized.  Thank you for the consult and the opportunity to see Mr. Raven Harmes, [place] [phone] 8:27 AM

## 2020-10-26 NOTE — H&P (View-Only) (Signed)
ORTHOPAEDIC CONSULTATION  REQUESTING PHYSICIAN: Angelica Pou, MD  Chief Complaint: Traumatic wound left leg.  HPI: [name] is a 75 y.o. male who presents with a hematoma anterior lateral left leg.  Patient underwent initial debridement in the emergency room and findings at that time were consistent with hematoma.  The cultures remain negative after 2 days.  Patient has multiple medical problems.  Patient states that he bumped his leg on the car door.  Social history patient states that he does drive a car and states that he lives home alone.  Past Medical History:  Diagnosis Date   Arthritis    Atrial fibrillation (HCC)    BPH (benign prostatic hypertrophy) 04/15/2010   Cataract    Colon polyps    DEPRESSION 01/22/2009   Heart valve replaced    HYPERCHOLESTEROLEMIA 01/22/2009   HYPERTENSION 01/22/2009   Dr. 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/22/2009   IDDM 01/22/2009   Morbid obesity ([place])    Severe nonproliferative diabetic retinopathy of both eyes ([place]) 08/27/2019   Stage 3b chronic kidney disease ([place]) 09/18/2019   Tremors of nervous system    ?ptsd   Ulcer    Vitreous hemorrhage of left eye ([place]) 08/27/2019   Past Surgical History:  Procedure Laterality Date   AORTIC VALVE REPLACEMENT  July 2006   #20 stentless [place] porcine valve   APPENDECTOMY     bilateral cataract surg     BIOPSY  05/01/2020   Procedure: BIOPSY;  Surgeon: Eloise Harman, DO;  [facility];  Service: Endoscopy;;   COLONOSCOPY  04/24/2007   [name]: normal   COLONOSCOPY WITH ESOPHAGOGASTRODUODENOSCOPY (EGD) N/A 04/05/2012   normal rectum, somewhat elongated and redundant colon, otherwise normal. Remote history of colon polyps. EGD: normal esophagus, bile-stained gastric mucosa, diffuse patchy gastric erythema, small duodenal bulbar ulcer with associated erosions. Negative H.pylori. H.pylori serology also negative.    COLONOSCOPY WITH PROPOFOL N/A 05/01/2020    Procedure: COLONOSCOPY WITH PROPOFOL;  Surgeon: Eloise Harman, DO;  [facility];  Service: Endoscopy;  Laterality: N/A;   DENTAL SURGERY  05/2004   Dental extractions   ESOPHAGOGASTRODUODENOSCOPY (EGD) WITH PROPOFOL N/A 05/01/2020   Procedure: ESOPHAGOGASTRODUODENOSCOPY (EGD) WITH PROPOFOL;  Surgeon: Eloise Harman, DO;  [facility];  Service: Endoscopy;  Laterality: N/A;   EYE SURGERY Bilateral    cataract   HEEL SPUR SURGERY Bilateral    resection of heel spur   KNEE ARTHROSCOPY WITH LATERAL MENISECTOMY Right 04/04/2014   Procedure: KNEE ARTHROSCOPY WITH LATERAL MENISECTOMY;  Surgeon: Carole Civil, MD;  Location: [facility] OR[facility];  Service: Orthopedics;  Laterality: Right;   PACEMAKER IMPLANT N/A 06/13/2016   Procedure: Pacemaker Implant- Dual Chamber;  Surgeon: Evans Lance, MD;  Location: [place] [facility];  Service: Cardiovascular;  Laterality: N/A;   PACEMAKER INSERTION  2018   THYROIDECTOMY  03/21/2013   DR [name]   THYROIDECTOMY N/A 03/21/2013   Procedure: THYROIDECTOMY;  Surgeon: [name], MD;  Location[facility];  Service: General;  Laterality: N/A;   TONSILLECTOMY     Social History   Socioeconomic History   Marital status: Single    Spouse name: Not on file   Number of children: 1   Years of education: HS   Highest education level: Not on file  Occupational History   Occupation: Truck Geophysicist/field seismologist, retired    Fish farm manager: RETIRED  Tobacco Use   Smoking status: Former    Packs/day: 0.00  Years: 12.00    Pack years: 0.00    Types: Cigarettes    Quit date: 09/20/1961    Years since quitting: 59.1   Smokeless tobacco: Never  Vaping Use   Vaping Use: Never used  Substance and Sexual Activity   Alcohol use: Not Currently   Drug use: No   Sexual activity: Never    Comment: Has not smoked in 20 years  Other Topics Concern   Not on file  Social History Narrative   Lives alone.   Quit smoking approximately 28 years ago.   Long haul truck  driver, lives in [place].   Social Determinants of Health   Financial Resource Strain: Not on file  Food Insecurity: Not on file  Transportation Needs: Not on file  Physical Activity: Not on file  Stress: Not on file  Social Connections: Not on file   Family History  Problem Relation Age of Onset   Heart disease Father    Congestive Heart Failure Father    Arthritis Father    Diabetes Other    Benign prostatic hyperplasia Brother    Colon cancer Neg Hx    Colon polyps Neg Hx    - negative except otherwise stated in the family history section Allergies  Allergen Reactions   Phenothiazines Anaphylaxis   Actos [Pioglitazone] Swelling   Prior to Admission medications   Medication Sig Start Date End Date Taking? Authorizing Provider  acetaminophen (TYLENOL) 500 MG tablet Take 1,000 mg by mouth every 6 (six) hours as needed (pain).    [provider]  albuterol (VENTOLIN HFA) 108 (90 Base) MCG/ACT inhaler Inhale 2 puffs into the lungs every 4 (four) hours as needed for shortness of breath or wheezing. 08/30/20   [provider]  amiodarone (PACERONE) 200 MG tablet Take 200 mg by mouth daily. 08/21/20   [provider]  apixaban (ELIQUIS) 5 MG TABS tablet Take 1 tablet (5 mg total) by mouth 2 (two) times daily. 02/17/20   Dettinger, Fransisca Kaufmann, MD  azithromycin (ZITHROMAX) 250 MG tablet Take 250-500 mg by mouth See admin instructions. Take 500mg  by mouth on Day 1, then take 250mg  daily on Days 2-5. 08/30/20   [provider]  bacitracin ointment Apply 1 application topically 2 (two) times daily. Start applying antibiotic ointment starting tomorrow 10/10/20   Varney Biles, MD  cholecalciferol (VITAMIN D3) 25 MCG (1000 UNIT) tablet Take 1,000 Units by mouth every morning.    [provider]  dexamethasone (DECADRON) 2 MG tablet Take 6 mg by mouth every morning. 08/26/20   [provider]  diclofenac Sodium (VOLTAREN) 1 % GEL Apply 2 g  topically 4 (four) times daily as needed (pain). Do not take ibuprofen or aleve while using this gel    [provider]  doxycycline (VIBRAMYCIN) 100 MG capsule Take 100 mg by mouth 2 (two) times daily. For 5 days    [provider]  furosemide (LASIX) 20 MG tablet Take 20 mg by mouth every morning.    [provider]  gabapentin (NEURONTIN) 300 MG capsule Take 1 capsule (300 mg total) by mouth 2 (two) times daily. 02/17/20   Dettinger, Fransisca Kaufmann, MD  guaiFENesin (MUCINEX) 600 MG 12 hr tablet Take 1,200 mg by mouth 2 (two) times daily. 08/26/20   [provider]  hydrALAZINE (APRESOLINE) 25 MG tablet Take 25 mg by mouth every 8 (eight) hours. 09/21/20   [provider]  hydrocortisone cream 1 % Apply 1 application topically  2 (two) times daily. 04/06/20   Ivy Lynn, NP  insulin detemir (LEVEMIR FLEXTOUCH) 100 UNIT/ML FlexPen Inject 5 Units into the skin daily. 05/02/20   Hongalgi, Lenis Dickinson, MD  levothyroxine (SYNTHROID) 175 MCG tablet Take 1 tablet (175 mcg total) by mouth daily before breakfast. 02/17/20   Dettinger, Fransisca Kaufmann, MD  loperamide (IMODIUM A-D) 2 MG tablet Take 1 tablet (2 mg total) by mouth 4 (four) times daily as needed for diarrhea or loose stools. 03/19/20   Dettinger, Fransisca Kaufmann, MD  metFORMIN (GLUCOPHAGE) 1000 MG tablet TAKE 1 TABLET BY MOUTH TWICE DAILY WITH A MEAL. Patient taking differently: Take 1,000 mg by mouth in the morning and at bedtime. 07/24/15   Timmothy Euler, MD  Omega-3 Fatty Acids (FISH OIL) 1000 MG CAPS Take 1,000 mg by mouth 2 (two) times daily.    [provider]  pantoprazole (PROTONIX) 40 MG tablet Take 1 tablet (40 mg total) by mouth daily before breakfast. 05/03/20   Hongalgi, Lenis Dickinson, MD  predniSONE (DELTASONE) 20 MG tablet 2 po at same time daily for 5 days Patient taking differently: Take 40 mg by mouth daily. 2 po at same time daily for 5 days 07/23/20   Dettinger, Fransisca Kaufmann, MD  Semaglutide, 1 MG/DOSE,  (OZEMPIC, 1 MG/DOSE,) 2 MG/1.5ML SOPN Inject 1 mg into the skin once a week. 02/17/20   Dettinger, Fransisca Kaufmann, MD  simvastatin (ZOCOR) 40 MG tablet Take 40 mg by mouth at bedtime. For cholesterol    [provider]  simvastatin (ZOCOR) 80 MG tablet Take 0.5 tablets (40 mg total) by mouth at bedtime. Patient not taking: Reported on 10/23/2020 04/04/17   Timmothy Euler, MD  tamsulosin (FLOMAX) 0.4 MG CAPS capsule TAKE TWO CAPSULES BY MOUTH AT BEDTIME Patient taking differently: Take 0.8 mg by mouth daily after supper. 07/09/20   Dettinger, Fransisca Kaufmann, MD  vitamin B-12 (CYANOCOBALAMIN) 500 MCG tablet Take 500 mcg by mouth daily. 05/02/20   [provider]   VAS [name] LOWER EXTREMITY VENOUS (DVT)  Result Date: 10/25/2020  Lower Venous DVT Study Patient Name:  JOURDON ZIMMERLE  Date of Exam:   10/24/2020 Medical Rec #: [id number]         Accession #:    [id number] Date of Birth: 10-Aug-1945          Patient Gender: M Patient Age:   37 years Exam Location:  [facility] Procedure:      VAS [name] LOWER EXTREMITY VENOUS (DVT) Referring Phys: NISCHAL NARENDRA --------------------------------------------------------------------------------  Indications: Swelling. Other Indications: Necrotic wound to LLE post fall (10/10/20). Anticoagulation: Eliquis. Comparison Study: No previous exams Performing Technologist: Jody Hill RVT, RDMS  Examination Guidelines: A complete evaluation includes B-mode imaging, spectral Doppler, color Doppler, and power Doppler as needed of all accessible portions of each vessel. Bilateral testing is considered an integral part of a complete examination. Limited examinations for reoccurring indications may be performed as noted. The reflux portion of the exam is performed with the patient in reverse Trendelenburg.  +-----+---------------+---------+-----------+----------+--------------+ RIGHTCompressibilityPhasicitySpontaneityPropertiesThrombus Aging  +-----+---------------+---------+-----------+----------+--------------+ CFV  Full           Yes      Yes                                 +-----+---------------+---------+-----------+----------+--------------+   +---------+---------------+---------+-----------+----------+--------------+ LEFT     CompressibilityPhasicitySpontaneityPropertiesThrombus Aging +---------+---------------+---------+-----------+----------+--------------+ CFV      Full  Yes      Yes                                 +---------+---------------+---------+-----------+----------+--------------+ SFJ      Full                                                        +---------+---------------+---------+-----------+----------+--------------+ FV Prox  Full           Yes      Yes                                 +---------+---------------+---------+-----------+----------+--------------+ FV Mid   Full           Yes      Yes                                 +---------+---------------+---------+-----------+----------+--------------+ FV DistalFull           Yes      Yes                                 +---------+---------------+---------+-----------+----------+--------------+ PFV      Full                                                        +---------+---------------+---------+-----------+----------+--------------+ POP      Full           Yes      Yes                                 +---------+---------------+---------+-----------+----------+--------------+ PTV      Full                                                        +---------+---------------+---------+-----------+----------+--------------+ PERO     Full                                                        +---------+---------------+---------+-----------+----------+--------------+    Summary: RIGHT: - No evidence of common femoral vein obstruction.  LEFT: - There is no evidence of deep vein thrombosis in the lower  extremity. - There is no evidence of superficial venous thrombosis.  Subcutaneous edema in left lower extremity.  *See table(s) above for measurements and observations. Electronically signed by Harold Barban MD on 10/25/2020 at 9:33:14 AM.    Final    - pertinent xrays, CT, MRI studies were reviewed and independently interpreted  Positive ROS: All other systems have been reviewed  and were otherwise negative with the exception of those mentioned in the HPI and as above.  Physical Exam: General: Alert, no acute distress Psychiatric: Patient is competent for consent with normal mood and affect Lymphatic: No axillary or cervical lymphadenopathy Cardiovascular: No pedal edema Respiratory: No cyanosis, no use of accessory musculature GI: No organomegaly, abdomen is soft and non-tender    Images:  @ENCIMAGES @  Labs:  Lab Results  Component Value Date   HGBA1C 7.8 (H) 10/23/2020   HGBA1C 8.1 (H) 02/17/2020   HGBA1C 7.3 (H) 10/02/2019   ESRSEDRATE 26 (H) 08/16/2017   ESRSEDRATE 22 10/19/2012   CRP <0.8 08/16/2017   LABURIC 5.8 10/19/2012   REPTSTATUS 10/25/2020 FINAL 10/23/2020   GRAMSTAIN  10/23/2020    FEW WBC PRESENT,BOTH PMN AND MONONUCLEAR NO ORGANISMS SEEN    CULT  10/23/2020    NO GROWTH 2 DAYS Performed at [facility], [place] [address], [place], [place] [address]    LABORGA KLEBSIELLA PNEUMONIAE (A) 06/27/2020    Lab Results  Component Value Date   ALBUMIN 2.7 (L) 10/23/2020   ALBUMIN 3.1 (L) 10/21/2020   ALBUMIN 3.1 (L) 10/16/2020   LABURIC 5.8 10/19/2012     CBC EXTENDED Latest Ref Rng & Units 10/26/2020 10/26/2020 10/25/2020  WBC 4.0 - 10.5 K/uL 6.4 - 7.0  RBC 4.22 - 5.81 MIL/uL 2.41(L) 2.41(L) 2.33(L)  HGB 13.0 - 17.0 g/dL 7.8(L) - 7.4(L)  HCT 39.0 - 52.0 % 24.3(L) - 23.6(L)  PLT 150 - 400 K/uL 148(L) - 146(L)  NEUTROABS 1.7 - 7.7 K/uL 4.3 - 4.7  LYMPHSABS 0.7 - 4.0 K/uL 1.2 - 1.3    Neurologic: Patient does not have protective sensation bilateral  lower extremities.   MUSCULOSKELETAL:   Skin: Examination there is an open traumatic hematoma wound anterior lateral aspect of left leg this is 5 x 3 cm and approximately 2 cm deep.  There is black eschar around the wound there is no cellulitis.  There is no calf tenderness to palpation.  Patient has palpable dorsalis pedis pulses bilaterally.  No arterial insufficiency.  Patient's white cell count is 6.4 hemoglobin 7.8.  Albumin 2.7.  Hemoglobin A1c 7.8.  Assessment: Assessment: Traumatic blunt trauma will left leg with hematoma status post decompression with open wound.  Plan: We will plan for surgical debridement and wound closure on Wednesday.  Continue IV antibiotics until cultures are finalized.  Thank you for the consult and the opportunity to see Mr. Jaeveon Ashland, [place] [phone] 8:27 AM

## 2020-10-26 NOTE — TOC Initial Note (Addendum)
Transition of Care [facility]) - Initial/Assessment Note    Patient Details  Name: [name] MRN: [id number] Date of Birth: 1945/09/30  Transition of Care [facility]) CM/SW Contact:    Angelita Ingles, RN Phone Number:[phone]  10/26/2020, 1:21 PM  Clinical Narrative:                 [facility] consulted for patient with [facility] recommendations. CM at bedside to discuss disposition with patient. Patient states that he is from home alone and that prior to this hospitalization he has been independent and able to care for himselk independently. Patient reports that he does have PCP Dr. Bobette Mo at [facility] and he follows up. Patient states that he has transportation and access to his medications. Patient has walker and cane at home and denies a need for any other DME. CM discussed recommendation for short term rehab and patient states that he is not interested in rehab unless that means that the [place] will increase his check. CM explained the purpose of short term rehab and the patients states that he can not make that decision right now and  he wants time to think about it. CM asked patient if there is a family member that CM could notify to help with the decision. Patient states no he doesn't want anyone to help with the decision. TOC will continue to follow and discuss with patient again tomorrow.   1330 CM has reached out to April at [place] to determine benefits and SW info.   Expected Discharge Plan: Skilled Nursing Facility Barriers to Discharge: Continued Medical Work up   Patient Goals and CMS Choice Patient states their goals for this hospitalization and ongoing recovery are:: Patient states that he just wants to get better CMS Medicare.gov Compare Post Acute Care list provided to::  (patient refused) Choice offered to / list presented to : NA (patient refused)  Expected Discharge Plan and Services Expected Discharge Plan: [place] In-house Referral: NA Discharge Planning  Services: CM Consult Post Acute Care Choice: [place] Living arrangements for the past 2 months: Single Family Home                 DME Arranged: N/A DME Agency: NA       HH Arranged: NA HH Agency: NA        Prior Living Arrangements/Services Living arrangements for the past 2 months: Single Family Home Lives with:: Self Patient language and need for interpreter reviewed:: Yes Do you feel safe going back to the place where you live?: Yes      Need for Family Participation in Patient Care: Yes (Comment) Care giver support system in place?: No (comment) Current home services:  (n/a) Criminal Activity/Legal Involvement Pertinent to Current Situation/Hospitalization: No - Comment as needed  Activities of Daily Living      Permission Sought/Granted Permission sought to share information with : Case Manager (refused to share info with anyone else) Permission granted to share information with : No (states he doesnt want info shared)              Emotional Assessment Appearance:: Appears stated age Attitude/Demeanor/Rapport: Avoidant Affect (typically observed): Unable to Assess Orientation: : Oriented to Self, Oriented to Place, Oriented to Situation Alcohol / Substance Use: Not Applicable Psych Involvement: No (comment)  Admission diagnosis:  Hyperbilirubinemia [E80.6] Urinary tract infection without hematuria, site unspecified [N39.0] Altered mental status, unspecified altered mental status type [R41.82] Hypertension, unspecified type [I10] AMS (altered mental status) [  R41.82] Patient Active Problem List   Diagnosis Date Noted   Ulcer of left lower extremity with muscle involvement without evidence of necrosis ([place])    AMS (altered mental status) 10/22/2020   Cough 08/28/2020   COVID-19 virus infection 08/25/2020   Status post fall 08/24/2020   Status post biventricular pacemaker 08/24/2020   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) 08/24/2020   Chronic diastolic heart failure ([place]) 08/24/2020   Bilateral lower extremity edema 08/24/2020   C7 cervical fracture ([place]) 08/07/2020   Pacemaker 08/07/2020   Generalized weakness 04/28/2020   Orthostatic hypotension 04/28/2020   Acute cystitis without hematuria    Exudative age-related macular degeneration of right eye with active choroidal neovascularization ([place]) 04/14/2020   Skin tear of left lower leg without complication 71/24/5809   Tinea cruris 04/06/2020   Hospital discharge follow-up 03/13/2020   Ambulatory dysfunction 03/05/2020   Essential tremor 03/05/2020   Ataxia 03/03/2020   Rash 01/03/2020   Lumbar stenosis with neurogenic claudication 11/14/2019   Stage 3b chronic kidney disease ([place]) 09/18/2019   Syncope and collapse 09/16/2019   Severe nonproliferative diabetic retinopathy of both eyes associated with type 2 diabetes mellitus ([place]) 08/27/2019   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 without macular edema ([place]) 08/27/2019   Posterior vitreous detachment of right eye 08/27/2019   Severe nonproliferative diabetic retinopathy of right eye ([place]) 08/27/2019   Frequency of urination and polyuria 01/29/2019   Coronary artery disease involving native coronary artery of native heart without angina pectoris 01/27/2019   Microalbuminuria 12/24/2018   COPD (chronic obstructive pulmonary disease) ([place]) 06/25/2018   Thrombocytopenia ([place]) 05/09/2018   Diabetic neuropathy ([place]) 01/30/2018   S/P AVR (aortic valve replacement) 11/04/2016   Atrial fibrillation ([place]) 09/27/2016   Symptomatic bradycardia 06/12/2016   Urgency incontinence 11/10/2015   Erectile dysfunction 11/10/2015   Acute lower UTI 05/23/2015   Hypothyroidism 01/15/2015   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   Phimosis 01/15/2015   CHF (congestive heart failure) ([place]) 01/01/2015   B12 deficiency 04/08/2014   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 [place] [address] Phone: [phone] Fax: [place], [place] - [place] [address] [place] [address] Phone: [phone] Fax: [phone]     Social Determinants of Health (SDOH) Interventions    Readmission Risk Interventions Readmission Risk Prevention Plan 10/26/2020  Transportation Screening Complete  PCP or Specialist Appt within 3-5 Days Complete  HRI or [place] Complete  Social Work Consult for [place] Planning/Counseling Complete  Palliative Care Screening Not Applicable  Medication Review (RN Care Manager) Referral to Pharmacy  Some recent data might be hidden

## 2020-10-26 NOTE — Consult Note (Signed)
[place] Nurse Consult Note: Patient receiving care in [place] Reason for Consult: LLE wound Wound type: Trauma wound with hematoma Pressure Injury POA: NA Measurement: 5 cm x 3 cm x 2 cm with undermining at 10 o'clock of 3.5 cm  Wound bed: black, red, moist Drainage (amount, consistency, odor) serosanguinous Periwound: erythema noted around Dressing procedure/placement/frequency: Dr. Sharol Given plans for surgical debridement and wound closure on Wednesday of this week. Until then we will apply a moistened saline gauze and cover with dry gauze and wrap with Kerlix. Change twice daily.  Monitor the wound area(s) for worsening of condition such as: Signs/symptoms of infection, increase in size, development of or worsening of odor, development of pain, or increased pain at the affected locations.   Notify the medical team if any of these develop.  Thank you for the consult. [place] nurse will not follow at this time.   Please re-consult the [place] team if needed.  Cathlean Marseilles Tamala Julian, MSN, RN, [place], [place]le [place], [facility] Wound Treatment Associate Pager [phone]

## 2020-10-26 NOTE — Progress Notes (Signed)
Pharmacy Antibiotic Note  [name] is a 75 y.o. male admitted on 10/21/2020 with  wound infection .  Pharmacy has been consulted for Vancomycin and Cefepime dosing.  Vanc dose based on Scr 1.84, and using 0.5L/kg for Vd results in estimated AUC 393 at Vancomycin 1000mg  Q24H and an estimated AUC 492 at vancomycin 1250mg  Q24H. Due to worsening renal function, will dose at the lower end of estimated AUC for now and re-assess daily while on vancomycin.  Plan: Resume cefepime 2gm IV Q12H Resume vancomycin 1000 mg IV Q24H. Goal AUC 400-550.  Weight: 107.9 kg (237 lb 14 oz)  Temp (24hrs), Avg:97.8 F (36.6 C), Min:97.5 F (36.4 C), Max:98.5 F (36.9 C)  Recent Labs  Lab 10/21/20 2028 10/22/20 0922 10/22/20 1129 10/23/20 0705 10/24/20 0149 10/24/20 1858 10/25/20 0046 10/25/20 1000 10/25/20 1338 10/26/20 0207  WBC 7.6  --   --  6.7 6.5 7.8 7.0  --   --  6.4  CREATININE 1.55*  --   --  1.45* 1.60*  --  1.64*  --   --  1.84*  LATICACIDVEN  --  1.4 1.3  --   --   --   --   --   --   --   VANCOTROUGH  --   --   --   --   --   --   --  22*  --   --   VANCOPEAK  --   --   --   --   --   --   --   --  38  --     Estimated Creatinine Clearance: 43.3 mL/min (A) (by C-G formula based on SCr of 1.84 mg/dL (H)).    Allergies  Allergen Reactions   Phenothiazines Anaphylaxis   Actos [Pioglitazone] Swelling    Antimicrobials this admission: Cefepime 9/29 > 10/02; 10/03 >> Vancomycin 9/29 > 10/02; 10/03 >> Fluconazole 9/29 x1 Doxycycline 10/02 > 10/03 Augmentin 10/02 > 10/03  Microbiology results: 9/29 BCx: NGTD 9/29 UCx: 60k colonies of yeast 10/2 Ucx: 20K colonies of yeast   Thank you for allowing pharmacy to be a part of this patient's care.  Ardyth Harps, PharmD Clinical Pharmacist

## 2020-10-26 NOTE — Progress Notes (Signed)
Physical Therapy Treatment Patient Details Name: [name] MRN: [id number] DOB: [birth year] Today's Date: 10/26/2020   History of Present Illness 75 y.o. M admitted on 10/22/20 due to AMS. On 9/30 pt had an I&D of LLE abcess. Past medical history of A. 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    PT Comments    Pt slowly progressing toward goals, pt having trouble pushing himself to progress gait distance and therefore stamina.  Emphasis on transitions, sit to stand technique with work on coming more forward and progression of gait stability and improving distances.  Pt deferred the standing exercise today, but agreed to 2 trials of gait.    Recommendations for follow up therapy are one component of a multi-disciplinary discharge planning process, led by the attending physician.  Recommendations may be updated based on patient status, additional functional criteria and insurance authorization.  Follow Up Recommendations  SNF;Other (comment) (will work toward [place] if enough assist can be provided.)     Equipment Recommendations   (TBA, HAS cane)    Recommendations for Other Services       Precautions / Restrictions Precautions Precautions: Fall     Mobility  Bed Mobility Overal bed mobility: Needs Assistance Bed Mobility: Supine to Sit     Supine to sit: Min assist (flat bed) Sit to supine: Min guard   General bed mobility comments: pt having trouble rolling over onto R elbow to come up, but got into bed with relative ease.    Transfers Overall transfer level: Needs assistance Equipment used: None;Rolling walker (2 wheeled) Transfers: Sit to/from Stand Sit to Stand: Min assist;Mod assist (mod from low surface, min with elevated surface)         General transfer comment: cues for hand placement and both assist forward and  boost.  Ambulation/Gait Ambulation/Gait assistance: Min assist Gait Distance (Feet): 25 Feet (x2 with 3 min recovery sitting EOB.)   Gait Pattern/deviations: Step-through pattern   Gait velocity interpretation: <1.8 ft/sec, indicate of risk for recurrent falls General Gait Details: mildly unsteady, less weak-kneed gait, Fatigues relatively quickly, rushing to sit down each of 2 trials and cuing pt to hold, back up and reach back.   Stairs             Wheelchair Mobility    Modified Rankin (Stroke Patients Only)       Balance Overall balance assessment: Needs assistance Sitting-balance support: Feet supported;Single extremity supported Sitting balance-Leahy Scale: Fair     Standing balance support: Bilateral upper extremity supported;No upper extremity supported;During functional activity Standing balance-Leahy Scale: Poor Standing balance comment: reliant on at least single UE support in standing, while engaging in grooming at sink level pt with no UE support but bracing with hips on counter                            Cognition Arousal/Alertness: Awake/alert Behavior During Therapy: WFL for tasks assessed/performed Overall Cognitive Status: Impaired/Different from baseline Area of Impairment: Orientation;Memory;Safety/judgement;Awareness;Problem solving                 Orientation Level: Disoriented to;Time;Situation   Memory: Decreased recall of precautions;Decreased short-term memory   Safety/Judgement: Decreased awareness of safety;Decreased awareness of deficits Awareness: Emergent Problem Solving: Slow processing        Exercises      General Comments  Pertinent Vitals/Pain Pain Assessment: No/denies pain    Home Living                      Prior Function            PT Goals (current goals can now be found in the care plan section) Acute Rehab PT Goals Patient Stated Goal: To go home PT Goal Formulation:  With patient Time For Goal Achievement: 11/07/20 Potential to Achieve Goals: Good Progress towards PT goals: Progressing toward goals    Frequency    Min 3X/week      PT Plan Current plan remains appropriate    Co-evaluation              AM-PAC PT "6 Clicks" Mobility   Outcome Measure  Help needed turning from your back to your side while in a flat bed without using bedrails?: A Little Help needed moving from lying on your back to sitting on the side of a flat bed without using bedrails?: A Little Help needed moving to and from a bed to a chair (including a wheelchair)?: A Little Help needed standing up from a chair using your arms (e.g., wheelchair or bedside chair)?: A Lot Help needed to walk in hospital room?: A Little Help needed climbing 3-5 steps with a railing? : A Lot 6 Click Score: 16    End of Session   Activity Tolerance: Patient tolerated treatment well;Patient limited by fatigue Patient left: in bed;with call bell/phone within reach;with bed alarm set Nurse Communication: Mobility status PT Visit Diagnosis: Unsteadiness on feet (R26.81)     Time: 8177-1165 PT Time Calculation (min) (ACUTE ONLY): 13 min  Charges:  $Gait Training: 8-22 mins                     10/26/2020  Ginger Carne., PT Acute Rehabilitation Services [phone]  (pager) [phone]  (office)   Tessie Fass Sacha Radloff 10/26/2020, 4:19 PM

## 2020-10-27 LAB — CBC WITH DIFFERENTIAL/PLATELET
Abs Immature Granulocytes: 0.06 10*3/uL (ref 0.00–0.07)
Basophils Absolute: 0.1 10*3/uL (ref 0.0–0.1)
Basophils Relative: 1 %
Eosinophils Absolute: 0.2 10*3/uL (ref 0.0–0.5)
Eosinophils Relative: 2 %
HCT: 24.4 % — ABNORMAL LOW (ref 39.0–52.0)
Hemoglobin: 7.6 g/dL — ABNORMAL LOW (ref 13.0–17.0)
Immature Granulocytes: 1 %
Lymphocytes Relative: 13 %
Lymphs Abs: 1 10*3/uL (ref 0.7–4.0)
MCH: 32.2 pg (ref 26.0–34.0)
MCHC: 31.1 g/dL (ref 30.0–36.0)
MCV: 103.4 fL — ABNORMAL HIGH (ref 80.0–100.0)
Monocytes Absolute: 0.7 10*3/uL (ref 0.1–1.0)
Monocytes Relative: 9 %
Neutro Abs: 5.8 10*3/uL (ref 1.7–7.7)
Neutrophils Relative %: 74 %
Platelets: 147 10*3/uL — ABNORMAL LOW (ref 150–400)
RBC: 2.36 MIL/uL — ABNORMAL LOW (ref 4.22–5.81)
RDW: 21 % — ABNORMAL HIGH (ref 11.5–15.5)
WBC: 7.9 10*3/uL (ref 4.0–10.5)
nRBC: 0 % (ref 0.0–0.2)

## 2020-10-27 LAB — BASIC METABOLIC PANEL
Anion gap: 7 (ref 5–15)
BUN: 20 mg/dL (ref 8–23)
CO2: 27 mmol/L (ref 22–32)
Calcium: 8.5 mg/dL — ABNORMAL LOW (ref 8.9–10.3)
Chloride: 102 mmol/L (ref 98–111)
Creatinine, Ser: 1.79 mg/dL — ABNORMAL HIGH (ref 0.61–1.24)
GFR, Estimated: 39 mL/min — ABNORMAL LOW (ref >60)
Glucose, Bld: 241 mg/dL — ABNORMAL HIGH (ref 70–99)
Potassium: 4.1 mmol/L (ref 3.5–5.1)
Sodium: 136 mmol/L (ref 135–145)

## 2020-10-27 LAB — CULTURE, BLOOD (ROUTINE X 2)
Culture: NO GROWTH
Culture: NO GROWTH
Special Requests: ADEQUATE
Special Requests: ADEQUATE

## 2020-10-27 LAB — GLUCOSE, CAPILLARY
Glucose-Capillary: 160 mg/dL — ABNORMAL HIGH (ref 70–99)
Glucose-Capillary: 197 mg/dL — ABNORMAL HIGH (ref 70–99)
Glucose-Capillary: 242 mg/dL — ABNORMAL HIGH (ref 70–99)
Glucose-Capillary: 229 mg/dL — ABNORMAL HIGH (ref 70–99)
Glucose-Capillary: 197 mg/dL — ABNORMAL HIGH (ref 70–99)

## 2020-10-27 LAB — PROTEIN ELECTROPHORESIS, SERUM
A/G Ratio: 1 (ref 0.7–1.7)
Albumin ELP: 2.9 g/dL (ref 2.9–4.4)
Alpha-1-Globulin: 0.2 g/dL (ref 0.0–0.4)
Alpha-2-Globulin: 0.6 g/dL (ref 0.4–1.0)
Beta Globulin: 0.9 g/dL (ref 0.7–1.3)
Gamma Globulin: 1.1 g/dL (ref 0.4–1.8)
Globulin, Total: 2.8 g/dL (ref 2.2–3.9)
Total Protein ELP: 5.7 g/dL — ABNORMAL LOW (ref 6.0–8.5)

## 2020-10-27 MED ORDER — FUROSEMIDE 10 MG/ML IJ SOLN
20.0000 mg | Freq: Once | INTRAMUSCULAR | Status: AC
  Administered 2020-10-27: 20 mg via INTRAVENOUS
  Filled 2020-10-27: qty 2

## 2020-10-27 MED ORDER — CEFAZOLIN SODIUM-DEXTROSE 2-4 GM/100ML-% IV SOLN
2.0000 g | INTRAVENOUS | Status: AC
  Administered 2020-10-28: 2 g via INTRAVENOUS
  Filled 2020-10-27 (×2): qty 100

## 2020-10-27 MED ORDER — CHLORHEXIDINE GLUCONATE 4 % EX LIQD
60.0000 mL | Freq: Once | CUTANEOUS | Status: DC
  Filled 2020-10-27: qty 60

## 2020-10-27 MED ORDER — POVIDONE-IODINE 10 % EX SWAB: 2.0000 "application " | Freq: Once | CUTANEOUS | Status: DC

## 2020-10-27 NOTE — Anesthesia Preprocedure Evaluation (Addendum)
Anesthesia Evaluation  Patient identified by MRN, date of birth, ID band Patient awake    Reviewed: Allergy & Precautions, NPO status , Patient's Chart, lab work & pertinent test results  Airway Mallampati: II  TM Distance: >3 FB Neck ROM: Full    Dental  (+) Edentulous Upper, Dental Advisory Given, Poor Dentition, Missing,    Pulmonary COPD, former smoker,    Pulmonary exam normal breath sounds clear to auscultation       Cardiovascular hypertension, + CAD and +CHF  Normal cardiovascular exam+ dysrhythmias (on eliquis) Atrial Fibrillation + pacemaker (for CHB) + Valvular Problems/Murmurs (s/p AVR)  Rhythm:Regular Rate:Normal  TTE 2022 1. Left ventricular ejection fraction, by estimation, is 65 to 70%. The  left ventricle has normal function. The left ventricle has no regional  wall motion abnormalities. There is mild left ventricular hypertrophy.  Left ventricular diastolic parameters  are indeterminate.  2. Right ventricular systolic function is normal. The right ventricular  size is normal.  3. The mitral valve is normal in structure. No evidence of mitral valve  regurgitation. No evidence of mitral stenosis.  4. The aortic valve has been repaired/replaced. Aortic valve  regurgitation is not visualized. No aortic stenosis is present.  5. The inferior vena cava is normal in size with greater than 50%  respiratory variability, suggesting right atrial pressure of 3 mmHg.    Neuro/Psych PSYCHIATRIC DISORDERS Depression negative neurological ROS     GI/Hepatic Neg liver ROS, GERD  ,  Endo/Other  diabetes, Insulin DependentHypothyroidism   Renal/GU Renal InsufficiencyRenal diseaseLab Results      Component                Value               Date                      CREATININE               1.78 (H)            10/28/2020                BUN                      19                  10/28/2020                NA                        135                 10/28/2020                K                        4.2                 10/28/2020                CL                       103                 10/28/2020                CO2  26                  10/28/2020             negative genitourinary   Musculoskeletal  (+) Arthritis ,   Abdominal   Peds  Hematology  (+) Blood dyscrasia, anemia , Lab Results      Component                Value               Date                      WBC                      6.7                 10/28/2020                HGB                      7.8 (L)             10/28/2020                HCT                      24.8 (L)            10/28/2020                MCV                      102.5 (H)           10/28/2020                PLT                      153                 10/28/2020              Anesthesia Other Findings   Reproductive/Obstetrics                           Anesthesia Physical Anesthesia Plan  ASA: 3  Anesthesia Plan: General   Post-op Pain Management:    Induction: Intravenous  PONV Risk Score and Plan: 2 and Ondansetron, Dexamethasone and Treatment may vary due to age or medical condition  Airway Management Planned: LMA  Additional Equipment:   Intra-op Plan:   Post-operative Plan: Extubation in OR  Informed Consent: I have reviewed the patients History and Physical, chart, labs and discussed the procedure including the risks, benefits and alternatives for the proposed anesthesia with the patient or authorized representative who has indicated his/her understanding and acceptance.     Dental advisory given  Plan Discussed with: CRNA  Anesthesia Plan Comments:         Anesthesia Quick Evaluation

## 2020-10-27 NOTE — TOC Progression Note (Addendum)
Transition of Care [facility]) - Progression Note    Patient Details  Name: [name] MRN: [id number] Date of Birth: 09/21/1945  Transition of Care [facility]) CM/SW [place], RN Phone Number:[phone]  10/27/2020, 9:48 AM  Clinical Narrative:    Patient called main TOC office to request to speak with CM about placement @ [place] facility. CM at bedside and patient states that he would like to go to a [place] facility long term. CM has left a message with the coordinator at the [place]. Awaiting return call to determine what level of benefits the patient has.   1400 CM received call from [place]. Patient is not service connected therefore he will need to use his medicare benefits. Patient is not eligible for any long term care. CM at bedside to update patient on [place] benefits info. Patient verbalized understanding but states that he does not want to do short term care. Patient states that he will just go home. Patient states that he is undecided about accepting home health. CM requested permission to discuss discharge plan with patients brother but patient states that his brother is in worse shape than patient and requested that brother not be contacted at this time. CM has left contact number with patient. Patient states that he will call CM when he decides what he wants to do.    Expected Discharge Plan: [place] Barriers to Discharge: Continued Medical Work up  Expected Discharge Plan and Services Expected Discharge Plan: [place] In-house Referral: NA Discharge Planning Services: CM Consult Post Acute Care Choice: [place] Living arrangements for the past 2 months: Single Family Home                 DME Arranged: N/A DME Agency: NA       HH Arranged: NA HH Agency: NA         Social Determinants of Health (SDOH) Interventions    Readmission Risk Interventions Readmission Risk Prevention Plan 10/26/2020  Transportation Screening  Complete  PCP or Specialist Appt within 3-5 Days Complete  HRI or Home Care Consult Complete  Social Work Consult for [place] Planning/Counseling Complete  Palliative Care Screening Not Applicable  Medication Review Press photographer) Referral to Pharmacy  Some recent data might be hidden

## 2020-10-27 NOTE — Plan of Care (Signed)

## 2020-10-27 NOTE — Progress Notes (Signed)
Patient ID: [name], male   DOB: [birth year], 75 y.o.   MRN: [id number] Plan for debridement and wound closure of left leg wound tomorrow Wednesday approximately 10 AM.  Cultures negative to date.

## 2020-10-27 NOTE — Progress Notes (Signed)
HD 5 SUBJECTIVE:  Patient Summary: [name] is a 75 y.o. male (he/him/his) with a pertinent PMH of persistent atrial fibrillation (on Eliquis), complete heart block s/p PPM, grade II HFpEF, hypertension, hypothyroidism, diabetes mellitus, CKD IIIb, who presents to [facility] with altered mental status.   Overnight Events: None. Patient evaluated at bedside this AM, was seen sitting up in his chair. A&Ox 2, person and place only.  Patient does not remember this author and Dr. Charleen Kirks, despite encounter the day prior.  Patient was not able to tell the reason why he is in the hospital.  He denied any pain at the site of the wound, he also had no other concerns.  OBJECTIVE:  Vital Signs: Vitals:   10/27/20 0500 10/27/20 0547 10/27/20 0832 10/27/20 1122  BP:  (!) 152/77 123/64 140/83  Pulse:  75 62 71  Resp:  18 17 19   Temp:  98 F (36.7 C) 98.1 F (36.7 C) 98.2 F (36.8 C)  TempSrc:  Oral Oral Oral  SpO2:  97% 98% 99%  Weight: 107.5 kg      Supplemental O2: Room Air SpO2: 99 %  Filed Weights   10/25/20 0350 10/26/20 0500 10/27/20 0500  Weight: 108.4 kg 107.9 kg 107.5 kg    Intake/Output Summary (Last 24 hours) at 10/27/2020 1842 Last data filed at 10/27/2020 1100 Gross per 24 hour  Intake 888.92 ml  Output 1087 ml  Net -198.08 ml   Net IO Since Admission: 154.59 mL [10/27/20 1842]  Physical Exam: Physical Exam Vitals and nursing note reviewed.  Constitutional:      General: He is not in acute distress.    Appearance: He is not ill-appearing.  HENT:     Head: Normocephalic and atraumatic.     Mouth/Throat:     Mouth: Mucous membranes are moist.     Pharynx: Oropharynx is clear.  Eyes:     Extraocular Movements: Extraocular movements intact.     Pupils: Pupils are equal, round, and reactive to light.  Cardiovascular:     Rate and Rhythm: Normal rate and regular rhythm.     Pulses: Normal pulses.          Dorsalis pedis pulses are 2+ on the right side and 2+ on the  left side.  Pulmonary:     Effort: Pulmonary effort is normal. No respiratory distress.  Abdominal:     General: There is no distension.     Palpations: Abdomen is soft.     Tenderness: There is no abdominal tenderness. There is no guarding.  Musculoskeletal:        General: No tenderness.     Cervical back: Normal range of motion.     Right lower leg: No edema.     Left lower leg: No edema.     Comments: Left lower extremity necrotic ulcer with some sanguinous drainage.  No migration of erythema.  No foul odor.  No purulent drainage.  Skin:    General: Skin is warm and dry.  Neurological:     Mental Status: He is alert. He is disoriented.     Cranial Nerves: No cranial nerve deficit or dysarthria.     Sensory: Sensation is intact.     Comments: A&Ox2 - person, place only, see above for more details   Psychiatric:        Attention and Perception: Attention and perception normal.        Mood and Affect: Mood and affect normal.  Behavior: Behavior normal.        Cognition and Memory: Memory is impaired.   Pertinent Labs: CBC Latest Ref Rng & Units 10/27/2020 10/26/2020 10/25/2020  WBC 4.0 - 10.5 K/uL 7.9 6.4 7.0  Hemoglobin 13.0 - 17.0 g/dL 7.6(L) 7.8(L) 7.4(L)  Hematocrit 39.0 - 52.0 % 24.4(L) 24.3(L) 23.6(L)  Platelets 150 - 400 K/uL 147(L) 148(L) 146(L)   CMP Latest Ref Rng & Units 10/27/2020 10/26/2020 10/25/2020  Glucose 70 - 99 mg/dL 241(H) 211(H) 209(H)  BUN 8 - 23 mg/dL 20 17 18   Creatinine 0.61 - 1.24 mg/dL 1.79(H) 1.84(H) 1.64(H)  Sodium 135 - 145 mmol/L 136 135 136  Potassium 3.5 - 5.1 mmol/L 4.1 3.8 3.9  Chloride 98 - 111 mmol/L 102 104 105  CO2 22 - 32 mmol/L 27 23 25   Calcium 8.9 - 10.3 mg/dL 8.5(L) 8.4(L) 8.2(L)  Total Protein 6.5 - 8.1 g/dL - - -  Total Bilirubin 0.3 - 1.2 mg/dL - - -  Alkaline Phos 38 - 126 U/L - - -  AST 15 - 41 U/L - - -  ALT 0 - 44 U/L - - -   Recent Labs    10/27/20 1058 10/27/20 1538 10/27/20 1739  GLUCAP 197* 242* 229*     Pertinent Imaging: No results found.  ASSESSMENT/PLAN:  Assessment: Active Problems:   AMS (altered mental status)   Ulcer of left lower extremity with muscle involvement without evidence of necrosis (HCC)  [name] is a 75 y.o. male (he/him/his) with a pertinent PMH of persistent atrial fibrillation (on Eliquis), complete heart block s/p PPM, grade II diastolic heart failure, hypertension, hypothyroidism, diabetes mellitus, CKD IIIb, who presents to [facility] with altered mental status.  HD 5  Plan: # Acute Encephalopathy, resolved  # Memory Deficits Patient presented with altered mental status after being found in his car for >10 hours likely secondary to possible infection, hypothyroidism, urinary retention. At this time, he is alert and oriented to person and place only. Patient has impaired memory from the day prior. See subjective for additional details. It seems that orientation waxes and wanes, as there are times he is unsure of where he is, this could possibly be due to delirium. Considering an aspect of dementia, potentially vascular dementia. We will have patient follow up outpatient. - Will obtain MMSE  # LLE Necrotic Ulcer Secondary to mechanical fall 9/16. Due to poor wound healing in the setting of diabetes and vascular disease, abrasion developed into non-healing ulcer with necrosis. Ortho consulted in the ED; I&D showed hematoma. MRI negative for osteomyelitis. No formal ABIs in chart, however DP pulses are intact. Wound culture, no growth in 4 days. Planned for possible debridement in the OR on 10/5 with ortho. - Orthopedics consulted - Continue IV vancomycin and cefepime - OR with ortho on 10/5 for possible debridement   # Acute on chronic macrocytic anemia # Anemia of Chronic Disease Downtrend in hemoglobin over the last 3 months with no clear source. Despite macrocytic nature, folate and B12 are WNL. Patient is anti-coagulated increasing the risk for bleed, however  iron panel suggests anemia of chronic disease. Reticulocyte revealed inappropriately normal count of 2.2, suggesting an inadequate compensation for anemia, possibly secondary to myelodysplastic syndrome.  - Trend hemoglobin  - Transfuse as needed for hemoglobin < 7  # Urinary Incontinence # Urinary Retention # Hx of BPH # Hx of ESBL Klebsiella UTI Per chart review, patient has a history of chronic urinary incontinence possibly secondary to  BPH, urethral strictures, UTI. Current complaints are concerning for overflow incontinence so PVR was obtained and elevated at 292 cc. History of multiple urological procedures resulting in urethral strictures. Renal ultrasound showed no hydronephrosis, but did show increased renal cortical echogenicity and left pleural effusion. Patient does have a history of ESBL Klebsiella UTI, however no dysuria. Repeat urine culture shows yeast colonization, likely chronic colonization since he denies symptoms of UTI.  We will continue to hold starting antibiotics, in the setting of asymptomatic funguria . - In/Out cath as needed - Bladder scans Q8h - Continue tamsulosin 0.8 mg daily - Follow-up with urology outpatient  # Recurrent Falls  # History of Peripheral Neuropathy  History of recurrent falls leading to injuries requiring numerous hospital visit, including admission from 8/27 - 9/6. Current wound is from fall shortly after discharge. PT/OT recommending SNF.  -Continue gabapentin 300 mg twice daily -SNF placement  #HFpEF (Grade II DD) Hypervolemic on examination.  We will consider increasing home Lasix. - 1 dose of IV Lasix 20 mg - Continue home Lasix 20mg  PO - Strict I&O, daily weights   #CKDIII Baseline Cr 1.5. Elevated at 1.8 on 10/3. - Avoid nephrotoxic agents   #Type II Diabetes Mellitus HbA1c 7.8%.  - Levemir 5U + SSI - CBG monitoring  - Gabapentin 300 mg twice daily - Will have patient follow-up outpatient for more optimal medical management  and encourage lifestyle modification   #Persistent atrial fibrillation on Eliquis #Hx of complete heart block s/p PPM CHA2DS2VASc Score - 5 HASBLED Score- 2.  - Continue Amiodarone - Holding Eliquis at this time for severe anemia  # HTN  Blood pressure well controlled at this time - Continue home hydralazine  # Hypothyroidism TSH elevated at 15 secondary to unintentional medicine noncompliance.  Patient was informed he must take Synthroid on an empty stomach. Will have patient recheck TSH in 1 month outpatient. - Continue Synthroid 175 mcg  Best Practice: Diet:  Cardiac and diabetic diet IVF: None,None VTE:  Held Code: Full Code  PT/OT recs: SNF  Prior to Admission Living Arrangement: Home  Anticipated Discharge Location: SNF  Barriers to Discharge: Decreased caregiver support, IV cefepime and vancomycin   Dispo: Anticipated discharge in 1-2 days.  Signature: Merrily Brittle, DO Psychiatry Resident, PGY-1 [facility] Teaching Service Pager: [phone] After 5pm on weekdays and 1pm on weekends: On Call pager [phone]  10/27/2020, 6:42 PM

## 2020-10-28 ENCOUNTER — Inpatient Hospital Stay (HOSPITAL_COMMUNITY): Payer: No Typology Code available for payment source | Admitting: Anesthesiology

## 2020-10-28 ENCOUNTER — Encounter (HOSPITAL_COMMUNITY): Payer: Self-pay | Admitting: Internal Medicine

## 2020-10-28 ENCOUNTER — Ambulatory Visit: Payer: No Typology Code available for payment source | Admitting: Cardiology

## 2020-10-28 ENCOUNTER — Encounter (HOSPITAL_COMMUNITY)
Admission: EM | Disposition: A | Discharge status: Skilled Nursing Facility | Payer: Self-pay | Source: Home / Self Care | Attending: Internal Medicine

## 2020-10-28 DIAGNOSIS — D649 Anemia, unspecified: Secondary | ICD-10-CM | POA: Diagnosis not present

## 2020-10-28 DIAGNOSIS — L97925 Non-pressure chronic ulcer of unspecified part of left lower leg with muscle involvement without evidence of necrosis: Secondary | ICD-10-CM | POA: Diagnosis not present

## 2020-10-28 DIAGNOSIS — N183 Chronic kidney disease, stage 3 unspecified: Secondary | ICD-10-CM | POA: Diagnosis not present

## 2020-10-28 HISTORY — PX: I & D EXTREMITY: SHX5045

## 2020-10-28 LAB — CBC WITH DIFFERENTIAL/PLATELET
Abs Immature Granulocytes: 0.05 10*3/uL (ref 0.00–0.07)
Basophils Absolute: 0.1 10*3/uL (ref 0.0–0.1)
Basophils Relative: 1 %
Eosinophils Absolute: 0.2 10*3/uL (ref 0.0–0.5)
Eosinophils Relative: 3 %
HCT: 24.8 % — ABNORMAL LOW (ref 39.0–52.0)
Hemoglobin: 7.8 g/dL — ABNORMAL LOW (ref 13.0–17.0)
Immature Granulocytes: 1 %
Lymphocytes Relative: 18 %
Lymphs Abs: 1.2 10*3/uL (ref 0.7–4.0)
MCH: 32.2 pg (ref 26.0–34.0)
MCHC: 31.5 g/dL (ref 30.0–36.0)
MCV: 102.5 fL — ABNORMAL HIGH (ref 80.0–100.0)
Monocytes Absolute: 0.8 10*3/uL (ref 0.1–1.0)
Monocytes Relative: 12 %
Neutro Abs: 4.5 10*3/uL (ref 1.7–7.7)
Neutrophils Relative %: 65 %
Platelets: 153 10*3/uL (ref 150–400)
RBC: 2.42 MIL/uL — ABNORMAL LOW (ref 4.22–5.81)
RDW: 20.1 % — ABNORMAL HIGH (ref 11.5–15.5)
WBC: 6.7 10*3/uL (ref 4.0–10.5)
nRBC: 0 % (ref 0.0–0.2)

## 2020-10-28 LAB — GLUCOSE, CAPILLARY
Glucose-Capillary: 180 mg/dL — ABNORMAL HIGH (ref 70–99)
Glucose-Capillary: 198 mg/dL — ABNORMAL HIGH (ref 70–99)
Glucose-Capillary: 240 mg/dL — ABNORMAL HIGH (ref 70–99)
Glucose-Capillary: 267 mg/dL — ABNORMAL HIGH (ref 70–99)

## 2020-10-28 LAB — BASIC METABOLIC PANEL
Anion gap: 6 (ref 5–15)
BUN: 19 mg/dL (ref 8–23)
CO2: 26 mmol/L (ref 22–32)
Calcium: 8.4 mg/dL — ABNORMAL LOW (ref 8.9–10.3)
Chloride: 103 mmol/L (ref 98–111)
Creatinine, Ser: 1.78 mg/dL — ABNORMAL HIGH (ref 0.61–1.24)
GFR, Estimated: 39 mL/min — ABNORMAL LOW (ref >60)
Glucose, Bld: 209 mg/dL — ABNORMAL HIGH (ref 70–99)
Potassium: 4.2 mmol/L (ref 3.5–5.1)
Sodium: 135 mmol/L (ref 135–145)

## 2020-10-28 SURGERY — IRRIGATION AND DEBRIDEMENT EXTREMITY
Anesthesia: General | Laterality: Left

## 2020-10-28 MED ORDER — LIDOCAINE 2% (20 MG/ML) 5 ML SYRINGE
INTRAMUSCULAR | Status: DC | PRN
  Administered 2020-10-28: 60 mg via INTRAVENOUS

## 2020-10-28 MED ORDER — ACETAMINOPHEN 500 MG PO TABS
1000.0000 mg | ORAL_TABLET | Freq: Once | ORAL | Status: AC
  Administered 2020-10-28: 1000 mg via ORAL
  Filled 2020-10-28: qty 2

## 2020-10-28 MED ORDER — LACTATED RINGERS IV SOLN: INTRAVENOUS | Status: DC

## 2020-10-28 MED ORDER — CHLORHEXIDINE GLUCONATE 0.12 % MT SOLN
OROMUCOSAL | Status: AC
  Administered 2020-10-28: 15 mL via OROMUCOSAL
  Filled 2020-10-28: qty 15

## 2020-10-28 MED ORDER — EPHEDRINE SULFATE-NACL 50-0.9 MG/10ML-% IV SOSY
PREFILLED_SYRINGE | INTRAVENOUS | Status: DC | PRN
  Administered 2020-10-28: 10 mg via INTRAVENOUS

## 2020-10-28 MED ORDER — DEXAMETHASONE SODIUM PHOSPHATE 10 MG/ML IJ SOLN
INTRAMUSCULAR | Status: AC
  Filled 2020-10-28: qty 1

## 2020-10-28 MED ORDER — FENTANYL CITRATE (PF) 250 MCG/5ML IJ SOLN
INTRAMUSCULAR | Status: AC
  Filled 2020-10-28: qty 5

## 2020-10-28 MED ORDER — OXYCODONE HCL 5 MG PO TABS: 5.0000 mg | ORAL_TABLET | Freq: Four times a day (QID) | ORAL | Status: DC | PRN

## 2020-10-28 MED ORDER — ORAL CARE MOUTH RINSE: 15.0000 mL | Freq: Once | OROMUCOSAL | Status: AC

## 2020-10-28 MED ORDER — 0.9 % SODIUM CHLORIDE (POUR BTL) OPTIME
TOPICAL | Status: DC | PRN
  Administered 2020-10-28 (×2): 1000 mL

## 2020-10-28 MED ORDER — DEXAMETHASONE SODIUM PHOSPHATE 10 MG/ML IJ SOLN
INTRAMUSCULAR | Status: DC | PRN
  Administered 2020-10-28: 4 mg via INTRAVENOUS

## 2020-10-28 MED ORDER — ONDANSETRON HCL 4 MG/2ML IJ SOLN
INTRAMUSCULAR | Status: DC | PRN
  Administered 2020-10-28: 4 mg via INTRAVENOUS

## 2020-10-28 MED ORDER — CHLORHEXIDINE GLUCONATE 0.12 % MT SOLN: 15.0000 mL | Freq: Once | OROMUCOSAL | Status: AC

## 2020-10-28 MED ORDER — HYDROMORPHONE HCL 1 MG/ML IJ SOLN
0.5000 mg | INTRAMUSCULAR | Status: DC | PRN
  Administered 2020-10-28: 0.5 mg via INTRAVENOUS
  Filled 2020-10-28: qty 1

## 2020-10-28 MED ORDER — PHENYLEPHRINE 40 MCG/ML (10ML) SYRINGE FOR IV PUSH (FOR BLOOD PRESSURE SUPPORT)
PREFILLED_SYRINGE | INTRAVENOUS | Status: DC | PRN
  Administered 2020-10-28: 80 ug via INTRAVENOUS
  Administered 2020-10-28: 120 ug via INTRAVENOUS
  Administered 2020-10-28: 200 ug via INTRAVENOUS

## 2020-10-28 MED ORDER — FENTANYL CITRATE (PF) 100 MCG/2ML IJ SOLN
INTRAMUSCULAR | Status: DC | PRN
  Administered 2020-10-28 (×2): 25 ug via INTRAVENOUS

## 2020-10-28 MED ORDER — PROPOFOL 10 MG/ML IV BOLUS
INTRAVENOUS | Status: DC | PRN
  Administered 2020-10-28: 50 mg via INTRAVENOUS
  Administered 2020-10-28: 100 mg via INTRAVENOUS

## 2020-10-28 MED ORDER — ACETAMINOPHEN 500 MG PO TABS
1000.0000 mg | ORAL_TABLET | Freq: Three times a day (TID) | ORAL | Status: DC
  Administered 2020-10-28 – 2020-11-09 (×28): 1000 mg via ORAL
  Filled 2020-10-28 (×30): qty 2

## 2020-10-28 MED ORDER — ONDANSETRON HCL 4 MG/2ML IJ SOLN
INTRAMUSCULAR | Status: AC
  Filled 2020-10-28: qty 2

## 2020-10-28 MED ORDER — FENTANYL CITRATE (PF) 100 MCG/2ML IJ SOLN: 25.0000 ug | INTRAMUSCULAR | Status: DC | PRN

## 2020-10-28 MED ORDER — LACTATED RINGERS IV SOLN: INTRAVENOUS | Status: DC | PRN

## 2020-10-28 MED ORDER — PHENYLEPHRINE HCL-NACL 20-0.9 MG/250ML-% IV SOLN
INTRAVENOUS | Status: DC | PRN
  Administered 2020-10-28: 60 ug/min via INTRAVENOUS

## 2020-10-28 MED ORDER — PROPOFOL 10 MG/ML IV BOLUS
INTRAVENOUS | Status: AC
  Filled 2020-10-28: qty 20

## 2020-10-28 MED ORDER — LIDOCAINE 2% (20 MG/ML) 5 ML SYRINGE
INTRAMUSCULAR | Status: AC
  Filled 2020-10-28: qty 5

## 2020-10-28 SURGICAL SUPPLY — 41 items
BAG COUNTER SPONGE SURGICOUNT (BAG) IMPLANT
BAG SPNG CNTER NS LX DISP (BAG)
BLADE SURG 21 STRL SS (BLADE) ×2 IMPLANT
BNDG COHESIVE 6X5 TAN STRL LF (GAUZE/BANDAGES/DRESSINGS) ×1 IMPLANT
BNDG GAUZE ELAST 4 BULKY (GAUZE/BANDAGES/DRESSINGS) ×4 IMPLANT
CANISTER WOUND CARE 500ML ATS (WOUND CARE) ×1 IMPLANT
COVER SURGICAL LIGHT HANDLE (MISCELLANEOUS) ×4 IMPLANT
DRAPE INCISE IOBAN 66X45 STRL (DRAPES) ×1 IMPLANT
DRAPE U-SHAPE 47X51 STRL (DRAPES) ×2 IMPLANT
DRESSING PEEL AND PLAC PRVNA20 (GAUZE/BANDAGES/DRESSINGS) IMPLANT
DRSG ADAPTIC 3X8 NADH LF (GAUZE/BANDAGES/DRESSINGS) ×2 IMPLANT
DRSG PEEL AND PLACE PREVENA 20 (GAUZE/BANDAGES/DRESSINGS) ×2
DURAPREP 26ML APPLICATOR (WOUND CARE) ×2 IMPLANT
ELECT REM PT RETURN 9FT ADLT (ELECTROSURGICAL)
ELECTRODE REM PT RTRN 9FT ADLT (ELECTROSURGICAL) IMPLANT
GAUZE SPONGE 4X4 12PLY STRL (GAUZE/BANDAGES/DRESSINGS) ×2 IMPLANT
GLOVE SRG 8 PF TXTR STRL LF DI (GLOVE) IMPLANT
GLOVE SURG ORTHO LTX SZ9 (GLOVE) ×2 IMPLANT
GLOVE SURG UNDER POLY LF SZ7 (GLOVE) ×2 IMPLANT
GLOVE SURG UNDER POLY LF SZ8 (GLOVE) ×2
GLOVE SURG UNDER POLY LF SZ9 (GLOVE) ×2 IMPLANT
GOWN STRL REUS W/ TWL XL LVL3 (GOWN DISPOSABLE) ×2 IMPLANT
GOWN STRL REUS W/TWL XL LVL3 (GOWN DISPOSABLE) ×4
HANDPIECE INTERPULSE COAX TIP (DISPOSABLE)
KIT BASIN OR (CUSTOM PROCEDURE TRAY) ×2 IMPLANT
KIT DRSG PREVENA PLUS 7DAY 125 (MISCELLANEOUS) ×1 IMPLANT
KIT TURNOVER KIT B (KITS) ×2 IMPLANT
MANIFOLD NEPTUNE II (INSTRUMENTS) ×2 IMPLANT
NS IRRIG 1000ML POUR BTL (IV SOLUTION) ×2 IMPLANT
PACK ORTHO EXTREMITY (CUSTOM PROCEDURE TRAY) ×2 IMPLANT
PAD ARMBOARD 7.5X6 YLW CONV (MISCELLANEOUS) ×4 IMPLANT
SET HNDPC FAN SPRY TIP SCT (DISPOSABLE) IMPLANT
SPONGE T-LAP 18X18 ~~LOC~~+RFID (SPONGE) ×1 IMPLANT
STOCKINETTE IMPERVIOUS 9X36 MD (GAUZE/BANDAGES/DRESSINGS) IMPLANT
SUT ETHILON 2 0 PSLX (SUTURE) ×2 IMPLANT
SUT ETHILON 4 0 PS 2 18 (SUTURE) ×2 IMPLANT
SWAB COLLECTION DEVICE MRSA (MISCELLANEOUS) ×2 IMPLANT
SWAB CULTURE ESWAB REG 1ML (MISCELLANEOUS) IMPLANT
TOWEL GREEN STERILE (TOWEL DISPOSABLE) ×2 IMPLANT
TUBE CONNECTING 12X1/4 (SUCTIONS) ×2 IMPLANT
YANKAUER SUCT BULB TIP NO VENT (SUCTIONS) ×2 IMPLANT

## 2020-10-28 NOTE — Progress Notes (Signed)
PT Cancellation Note  Patient Details Name: [name] MRN: [id number] DOB: Dec 08, 1945   Cancelled Treatment:    Reason Eval/Treat Not Completed: Medical issues which prohibited therapy;Patient not medically ready.  Pt still very sedated from his procedure (I andD) today.   10/28/2020  Ginger Carne., PT Acute Rehabilitation Services [phone]  (pager) [phone]  (office)   [name] 10/28/2020, 5:33 PM

## 2020-10-28 NOTE — Consult Note (Signed)
   [facility] Inpatient Consult   10/28/2020  KHOEN GENET [birth year] [id number]  [place] Organization [ACO] Patient: Theme park manager Medicare/VA network  Primary Care Provider:  Dettinger, Fransisca Kaufmann, MD, [place] is an Embedded provider with a chronic care management program.  Patient was screened for [place] Management services. Patient will have the transition of care call conducted by the primary care provider. This patient is also in an Warden/ranger which has a chronic disease management Embedded Care Management team.  Plan: Continue to follow up and refer to the [place] Management depending on his disposition.  Please contact for further questions,  Natividad Brood, RN BSN [facility] Liaison  [phone] business mobile phone Toll free office [phone]  Fax number: [phone] [email or web address]

## 2020-10-28 NOTE — Progress Notes (Signed)
HD#6 SUBJECTIVE:  Patient Summary: [name] is a 75 y.o. male (he/him/his) with a pertinent PMH of persistent atrial fibrillation (on Eliquis), complete heart block s/p PPM, grade II HFpEF, hypertension, hypothyroidism, diabetes mellitus, CKD IIIb, who presents to [facility] with altered mental status.   Overnight Events: no acute events overnight  Interim History: This is hospital day 6 for [name] who was seen and evaluated at the bedside post-operatively. He endorses leg pain in the area of surgery, but no other pain at this time.   OBJECTIVE:  Vital Signs: Vitals:   10/27/20 0832 10/27/20 1122 10/27/20 1917 10/28/20 0500  BP: 123/64 140/83 (!) 132/50   Pulse: 62 71 60   Resp: 17 19 18    Temp: 98.1 F (36.7 C) 98.2 F (36.8 C) 97.8 F (36.6 C)   TempSrc: Oral Oral    SpO2: 98% 99% 100%   Weight:    108.3 kg   Supplemental O2: Room Air SpO2: 100 %  Filed Weights   10/26/20 0500 10/27/20 0500 10/28/20 0500  Weight: 107.9 kg 107.5 kg 108.3 kg    Intake/Output Summary (Last 24 hours) at 10/28/2020 0542 Last data filed at 10/28/2020 0454 Gross per 24 hour  Intake 483 ml  Output 1487 ml  Net -1004 ml   Net IO Since Admission: -745.41 mL [10/28/20 0542]  Physical Exam: General: Chronically ill appearing male. No acute distress. Head: Normocephalic. Atraumatic. Pulmonary: Normal effort. No audible wheezing  Extremities: Palpable DP pulses. Left lower extremity wrapped with wound-vac in place.  Skin: Warm and dry. No obvious rash or lesions. Neuro: Alert and oriented to self. Moves all extremities. Normal sensation. No focal deficit. Psych: Normal mood and affect  ASSESSMENT/PLAN:  Assessment: Active Problems:   AMS (altered mental status)   Ulcer of left lower extremity with muscle involvement without evidence of necrosis ([place])  Plan: # Acute Encephalopathy, resolved  # Memory Deficits Patient presented with altered mental status after being found in  his car for >10 hours likely secondary to possible infection, hypothyroidism, urinary retention. Initially on admission, cognitive dysfunction resolved with family endorsing that patient is at baseline. Since then, mentation waxing and waning; suspect hospital delirium.   - Will obtain MMSE on 10/06   # LLE Necrotic Ulcer Secondary to mechanical fall 9/16. Due to poor wound healing in the setting of diabetes and vascular disease, abrasion developed into non-healing ulcer with necrosis. S/p debridement today with multiple compartment release.   - Orthopedics following; appreciate their recommendations  - Continue IV vancomycin and cefepime for additional 24 hours  - Wound vac for 1 week  - PT/OT with weight bearing as tolerated; will need SNF placement    # Acute on chronic macrocytic anemia # Anemia of Chronic Disease Downtrend in hemoglobin over the last 3 months with no clear source. Despite macrocytic nature, folate and B12 are WNL. Reticulocyte index suggests hypo-proliferation.   - Trend hemoglobin  - Transfuse as needed for hemoglobin < 7   # Chronic Urinary Incontinence # Chronic Urinary Retention # Hx of BPH # Hx of ESBL Klebsiella UTI - Bladder scans Q8h - Continue tamsulosin 0.8 mg daily - Follow-up with urology outpatient   # Recurrent Falls  # History of Peripheral Neuropathy  -Continue gabapentin 300 mg twice daily -SNF placement   #HFpEF (Grade II DD) - Continue home Lasix 20mg  PO - Strict I&O, daily weights   #CKDIII - Avoid nephrotoxic agents    #Type II Diabetes  Mellitus HbA1c 7.8%.  - Levemir 5U + SSI - CBG monitoring  - Gabapentin 300 mg twice daily - Will have patient follow-up outpatient for more optimal medical management and encourage lifestyle modification   #Persistent atrial fibrillation on Eliquis #Hx of complete heart block s/p PPM CHA2DS2VASc Score - 5 HASBLED Score- 2.   - Continue Amiodarone - Holding Eliquis at this time for severe  anemia   # HTN  - Continue home Hydralazine   # Hypothyroidism - Continue Synthroid 175 mcg  Best Practice: Diet: Cardiac diet IVF: Fluids: none VTE: heparin injection 5,000 Units Start: 10/26/20 2200 Code: Full Therapy Recs: SNF  DISPO: Anticipated discharge in 1-2 days days to Skilled nursing facility pending  clinical improvement .  Signature: Dr. Jose Persia Internal Medicine PGY-3  Pager: [phone] After 5pm on weekdays and 1pm on weekends: On Call pager [phone]  10/28/2020, 2:23 PM

## 2020-10-28 NOTE — Progress Notes (Signed)
OT Cancellation Note  Patient Details Name: [name] MRN: [id number] DOB: 08-02-1945   Cancelled Treatment:    Reason Eval/Treat Not Completed: Fatigue/lethargy limiting ability to participate Per RN, pt unable to participate at this due to to lethargy after having recently received dilaudid.. OT will return as time allows and pt is appropriate.   [facility] OTR/L Acute Rehabilitation Services Office: [place] 10/28/2020, 3:58 PM

## 2020-10-28 NOTE — Transfer of Care (Signed)
Immediate Anesthesia Transfer of Care Note  Patient: Semaje Kinker Chieffo  Procedure(s) Performed: LEFT LEG DEBRIDEMENT (Left)  Patient Location: PACU  Anesthesia Type:General  Level of Consciousness: awake, drowsy and patient cooperative  Airway & Oxygen Therapy: Patient Spontanous Breathing and Patient connected to nasal cannula oxygen  Post-op Assessment: Report given to RN, Post -op Vital signs reviewed and stable and Patient moving all extremities X 4  Post vital signs: Reviewed and stable  Last Vitals:  Vitals Value Taken Time  BP 117/58 10/28/20 1051  Temp    Pulse 66 10/28/20 1053  Resp 25 10/28/20 1053  SpO2 92 % 10/28/20 1053  Vitals shown include unvalidated device data.  Last Pain:  Vitals:   10/28/20 0904  TempSrc:   PainSc: 0-No pain         Complications: No notable events documented.

## 2020-10-28 NOTE — Op Note (Signed)
10/28/2020  10:53 AM  PATIENT:  [name]    PRE-OPERATIVE DIAGNOSIS:  Ulcer Left Leg  POST-OPERATIVE DIAGNOSIS:  Same  PROCEDURE:  LEFT LEG DEBRIDEMENT Compartment releases of the anterior and lateral compartment. Local tissue rearrangement for wound closure 18 x 5 cm. Application of 20 cm Prevena wound VAC  SURGEON:  [name], MD  PHYSICIAN ASSISTANT:None ANESTHESIA:   General  PREOPERATIVE INDICATIONS:  [name] is a  75 y.o. male with a diagnosis of Ulcer Left Leg who failed conservative measures and elected for surgical management.    The risks benefits and alternatives were discussed with the patient preoperatively including but not limited to the risks of infection, bleeding, nerve injury, cardiopulmonary complications, the need for revision surgery, among others, and the patient was willing to proceed.  OPERATIVE IMPLANTS: 20 cm Prevena wound VAC.  @ENCIMAGES @  OPERATIVE FINDINGS: Patient had ischemic fascial changes the deep muscle in the anterior lateral compartment were healthy and viable.  OPERATIVE PROCEDURE: Patient brought the operating room and underwent a general anesthetic.  After adequate levels anesthesia were obtained patient's left lower extremity was prepped using DuraPrep draped into a sterile field a timeout was called.  A elliptical incision was made around the gangrenous ulcerative tissue this left a wound that was 18 x 5 cm.  There was a large hematoma the superficial peroneal nerve was identified within the wound and protected fasciotomies of the anterior compartment low obtain proximal and distal to the superficial peroneal nerve the muscle had good color contractility and consistency no deep abscess.  The lateral compartment also underwent compartment release and this muscle was also healthy and viable.  There was a large hematoma between the fascia and the skin as well as ischemic soft tissue changes.  This was extensively debrided  irrigated with normal saline.  Local tissue rearrangement was used to close the wound 18 x 5 cm.  A 20 cm Prevena wound VAC was applied this was outlined with derma tack this had a good suction fit this was covered with Covan patient was extubated taken the PACU in stable condition.  Debridement type: Excisional Debridement  Side: left  Body Location: leg   Tools used for debridement: scalpel and rongeur  Pre-debridement Wound size (cm):   Length: 3        Width: 3     Depth: 1   Post-debridement Wound size (cm):   Length: 18        Width: 5     Depth: 1   Debridement depth beyond dead/damaged tissue down to healthy viable tissue: yes  Tissue layer involved: skin, subcutaneous tissue, muscle / fascia  Nature of tissue removed: Slough, Necrotic, Devitalized Tissue, and Non-viable tissue  Irrigation volume: 3 liters     Irrigation fluid type: Normal Saline     DISCHARGE PLANNING:  Antibiotic duration: Continue antibiotics for 24 hours  Weightbearing: Weightbearing as tolerated on the left  Pain medication: Opioid pathway  Dressing care/ Wound VAC: Continue wound VAC for 1 week after discharge  Ambulatory devices: Walker or crutches  Discharge to: Anticipate discharge to home.  Follow-up: In the office 1 week post operative.

## 2020-10-28 NOTE — Progress Notes (Signed)
Patient confused and unable to consent for surgery. Called brother Karriem Muench for consent. Verified phone consent with second RN witness Drema Balzarine. Dr. Lanetta Inch aware.     Molden,Anthony (Brother)  [phone]

## 2020-10-28 NOTE — Anesthesia Postprocedure Evaluation (Signed)
Anesthesia Post Note  Patient: [name]  Procedure(s) Performed: LEFT LEG DEBRIDEMENT (Left)     Patient location during evaluation: PACU Anesthesia Type: General Level of consciousness: awake and alert Pain management: pain level controlled Vital Signs Assessment: post-procedure vital signs reviewed and stable Respiratory status: spontaneous breathing, nonlabored ventilation, respiratory function stable and patient connected to nasal cannula oxygen Cardiovascular status: blood pressure returned to baseline and stable Postop Assessment: no apparent nausea or vomiting Anesthetic complications: no   No notable events documented.  Last Vitals:  Vitals:   10/28/20 1105 10/28/20 1120  BP: (!) 126/51 (!) 130/55  Pulse: 60 (!) 59  Resp: 15 17  Temp:    SpO2: 94% 95%    Last Pain:  Vitals:   10/28/20 1120  TempSrc:   PainSc: 0-No pain                 Toretto Tingler L Elvenia Godden

## 2020-10-28 NOTE — Anesthesia Procedure Notes (Signed)
Procedure Name: LMA Insertion Date/Time: 10/28/2020 10:11 AM Performed by: Leonor Liv, CRNA Pre-anesthesia Checklist: Patient identified, Emergency Drugs available, Suction available and Patient being monitored Patient Re-evaluated:Patient Re-evaluated prior to induction Oxygen Delivery Method: Circle System Utilized Preoxygenation: Pre-oxygenation with 100% oxygen Induction Type: IV induction Ventilation: Mask ventilation without difficulty LMA: LMA inserted LMA Size: 4.0 Number of attempts: 1 Airway Equipment and Method: Bite block Placement Confirmation: positive ETCO2 Tube secured with: Tape Dental Injury: Teeth and Oropharynx as per pre-operative assessment

## 2020-10-28 NOTE — Plan of Care (Signed)
  Problem: Education: Goal: Knowledge of General Education information will improve Description: Including pain rating scale, medication(s)/side effects and non-pharmacologic comfort measures Outcome: Progressing   Problem: Activity: Goal: Risk for activity intolerance will decrease Outcome: Progressing   Problem: Nutrition: Goal: Adequate nutrition will be maintained Outcome: Progressing   Problem: Coping: Goal: Level of anxiety will decrease Outcome: Progressing   

## 2020-10-28 NOTE — Interval H&P Note (Signed)
History and Physical Interval Note:  10/28/2020 6:48 AM  [name]  has presented today for surgery, with the diagnosis of Ulcer Left Leg.  The various methods of treatment have been discussed with the patient and family. After consideration of risks, benefits and other options for treatment, the patient has consented to  Procedure(s): LEFT LEG DEBRIDEMENT (Left) as a surgical intervention.  The patient's history has been reviewed, patient examined, no change in status, stable for surgery.  I have reviewed the patient's chart and labs.  Questions were answered to the patient's satisfaction.     [name]

## 2020-10-29 ENCOUNTER — Encounter (HOSPITAL_COMMUNITY): Payer: Self-pay | Admitting: Orthopedic Surgery

## 2020-10-29 DIAGNOSIS — G9340 Encephalopathy, unspecified: Secondary | ICD-10-CM

## 2020-10-29 LAB — BASIC METABOLIC PANEL
Anion gap: 9 (ref 5–15)
BUN: 23 mg/dL (ref 8–23)
CO2: 27 mmol/L (ref 22–32)
Calcium: 9 mg/dL (ref 8.9–10.3)
Chloride: 102 mmol/L (ref 98–111)
Creatinine, Ser: 1.84 mg/dL — ABNORMAL HIGH (ref 0.61–1.24)
GFR, Estimated: 37 mL/min — ABNORMAL LOW (ref >60)
Glucose, Bld: 181 mg/dL — ABNORMAL HIGH (ref 70–99)
Potassium: 4.7 mmol/L (ref 3.5–5.1)
Sodium: 138 mmol/L (ref 135–145)

## 2020-10-29 LAB — GLUCOSE, CAPILLARY
Glucose-Capillary: 150 mg/dL — ABNORMAL HIGH (ref 70–99)
Glucose-Capillary: 142 mg/dL — ABNORMAL HIGH (ref 70–99)
Glucose-Capillary: 140 mg/dL — ABNORMAL HIGH (ref 70–99)
Glucose-Capillary: 131 mg/dL — ABNORMAL HIGH (ref 70–99)

## 2020-10-29 LAB — CBC WITH DIFFERENTIAL/PLATELET
Abs Immature Granulocytes: 0.04 10*3/uL (ref 0.00–0.07)
Basophils Absolute: 0 10*3/uL (ref 0.0–0.1)
Basophils Relative: 0 %
Eosinophils Absolute: 0 10*3/uL (ref 0.0–0.5)
Eosinophils Relative: 0 %
HCT: 26.4 % — ABNORMAL LOW (ref 39.0–52.0)
Hemoglobin: 8.3 g/dL — ABNORMAL LOW (ref 13.0–17.0)
Immature Granulocytes: 1 %
Lymphocytes Relative: 8 %
Lymphs Abs: 0.6 10*3/uL — ABNORMAL LOW (ref 0.7–4.0)
MCH: 32.5 pg (ref 26.0–34.0)
MCHC: 31.4 g/dL (ref 30.0–36.0)
MCV: 103.5 fL — ABNORMAL HIGH (ref 80.0–100.0)
Monocytes Absolute: 0.7 10*3/uL (ref 0.1–1.0)
Monocytes Relative: 9 %
Neutro Abs: 6.1 10*3/uL (ref 1.7–7.7)
Neutrophils Relative %: 82 %
Platelets: 167 10*3/uL (ref 150–400)
RBC: 2.55 MIL/uL — ABNORMAL LOW (ref 4.22–5.81)
RDW: 19.9 % — ABNORMAL HIGH (ref 11.5–15.5)
WBC: 7.4 10*3/uL (ref 4.0–10.5)
nRBC: 0 % (ref 0.0–0.2)

## 2020-10-29 MED ORDER — CEFAZOLIN SODIUM-DEXTROSE 2-4 GM/100ML-% IV SOLN
2.0000 g | Freq: Four times a day (QID) | INTRAVENOUS | Status: AC
  Administered 2020-10-29 – 2020-10-30 (×3): 2 g via INTRAVENOUS
  Filled 2020-10-29 (×3): qty 100

## 2020-10-29 MED ORDER — DOCUSATE SODIUM 100 MG PO CAPS
100.0000 mg | ORAL_CAPSULE | Freq: Two times a day (BID) | ORAL | Status: DC
  Administered 2020-10-29 (×2): 100 mg via ORAL
  Filled 2020-10-29 (×3): qty 1

## 2020-10-29 MED ORDER — OXYCODONE HCL 5 MG PO TABS: 5.0000 mg | ORAL_TABLET | ORAL | Status: DC | PRN

## 2020-10-29 MED ORDER — BISACODYL 10 MG RE SUPP: 10.0000 mg | Freq: Every day | RECTAL | Status: DC | PRN

## 2020-10-29 MED ORDER — HYDROMORPHONE HCL 1 MG/ML IJ SOLN: 0.5000 mg | INTRAMUSCULAR | Status: DC | PRN

## 2020-10-29 MED ORDER — OXYCODONE HCL 5 MG PO TABS: 10.0000 mg | ORAL_TABLET | ORAL | Status: DC | PRN

## 2020-10-29 MED ORDER — METOCLOPRAMIDE HCL 5 MG/ML IJ SOLN: 5.0000 mg | Freq: Three times a day (TID) | INTRAMUSCULAR | Status: DC | PRN

## 2020-10-29 MED ORDER — METHOCARBAMOL 500 MG PO TABS: 500.0000 mg | ORAL_TABLET | Freq: Four times a day (QID) | ORAL | Status: DC | PRN

## 2020-10-29 MED ORDER — METOCLOPRAMIDE HCL 5 MG PO TABS: 5.0000 mg | ORAL_TABLET | Freq: Three times a day (TID) | ORAL | Status: DC | PRN

## 2020-10-29 MED ORDER — POLYETHYLENE GLYCOL 3350 17 G PO PACK: 17.0000 g | PACK | Freq: Every day | ORAL | Status: DC | PRN

## 2020-10-29 MED ORDER — METHOCARBAMOL 1000 MG/10ML IJ SOLN
500.0000 mg | Freq: Four times a day (QID) | INTRAVENOUS | Status: DC | PRN
  Filled 2020-10-29: qty 5

## 2020-10-29 MED ORDER — SODIUM CHLORIDE 0.9 % IV SOLN: INTRAVENOUS | Status: DC

## 2020-10-29 MED ORDER — ONDANSETRON HCL 4 MG PO TABS: 4.0000 mg | ORAL_TABLET | Freq: Four times a day (QID) | ORAL | Status: DC | PRN

## 2020-10-29 MED ORDER — ACETAMINOPHEN 325 MG PO TABS: 325.0000 mg | ORAL_TABLET | Freq: Four times a day (QID) | ORAL | Status: DC | PRN

## 2020-10-29 MED ORDER — OXYCODONE HCL 5 MG PO TABS: 2.5000 mg | ORAL_TABLET | Freq: Four times a day (QID) | ORAL | Status: DC | PRN

## 2020-10-29 MED ORDER — ONDANSETRON HCL 4 MG/2ML IJ SOLN: 4.0000 mg | Freq: Four times a day (QID) | INTRAMUSCULAR | Status: DC | PRN

## 2020-10-29 NOTE — Plan of Care (Signed)
  Problem: Activity: Goal: Risk for activity intolerance will decrease Outcome: Progressing   Problem: Nutrition: Goal: Adequate nutrition will be maintained Outcome: Progressing   Problem: Coping: Goal: Level of anxiety will decrease Outcome: Progressing   Problem: Safety: Goal: Ability to remain free from injury will improve Outcome: Progressing   Problem: Skin Integrity: Goal: Risk for impaired skin integrity will decrease Outcome: Progressing   

## 2020-10-29 NOTE — Progress Notes (Signed)
Patient ID: [name], male   DOB: 06-Jun-1945, 75 y.o.   MRN: [id number] Patient is postoperative day 1 debridement of massive hematoma and ulceration left leg.  Patient without complaints this morning there is no drainage in the wound VAC canister.  Plan for physical therapy progressive ambulation discharge with the portable Praveena wound VAC pump.  There is a good suction fit with the pump without drainage.

## 2020-10-29 NOTE — Progress Notes (Signed)
Physical Therapy Treatment Patient Details Name: [name] MRN: [id number] DOB: [birth year] Today's Date: 10/29/2020   History of Present Illness 75 y.o. M admitted on 10/22/20 due to AMS. On 9/30 pt had an I&D of LLE abcess. 10/5 L LE debridement with compartment releases and application of VAC dressing.   Past medical history of A. 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    PT Comments    Pt still very drowsy and confused since surgical intervention 10/5.  Emphasis on exercise to warm up and try to arouse pt, transitions to EOB, sit to stand and transfers to the chair.  Pt was not able to focus enough on task to safely ambulate in the room away from a sitting surface.    Recommendations for follow up therapy are one component of a multi-disciplinary discharge planning process, led by the attending physician.  Recommendations may be updated based on patient status, additional functional criteria and insurance authorization.  Follow Up Recommendations  SNF     Equipment Recommendations  Other (comment) (TBD)    Recommendations for Other Services       Precautions / Restrictions Precautions Precautions: Fall     Mobility  Bed Mobility Overal bed mobility: Needs Assistance Bed Mobility: Supine to Sit     Supine to sit: Min assist     General bed mobility comments: pt still lethargic, confused and having trouble following direction.  Needed assist getting LE's off the bed and truncal assist up and forward.    Transfers Overall transfer level: Needs assistance   Transfers: Sit to/from Stand;Stand Pivot Transfers Sit to Stand: Mod assist Stand pivot transfers: Mod assist;+2 physical assistance       General transfer comment: stability assist and boost to stand.  2 person assist during transfer due to L>R LE bucckling.  Ambulation/Gait              General Gait Details: pt not safe to ambulate today due to he was unable to focus on task with knees frequently buckling just to pivot to the chair.   Stairs             Wheelchair Mobility    Modified Rankin (Stroke Patients Only)       Balance Overall balance assessment: Needs assistance   Sitting balance-Leahy Scale: Fair       Standing balance-Leahy Scale: Poor Standing balance comment: reliant on significant assist today                            Cognition Arousal/Alertness: Lethargic Behavior During Therapy: Flat affect Overall Cognitive Status: Impaired/Different from baseline                   Orientation Level: Disoriented to;Time;Situation   Memory: Decreased short-term memory   Safety/Judgement: Decreased awareness of safety;Decreased awareness of deficits Awareness: Intellectual Problem Solving: Slow processing;Decreased initiation;Requires verbal cues;Requires tactile cues        Exercises Other Exercises Other Exercises: hip/knee flexion/ext ROM with graded assist and resistance. Other Exercises: bicep/tricep presses with graded resistance    General Comments        Pertinent Vitals/Pain Faces Pain Scale: Hurts little more Pain Location: leg Pain Descriptors / Indicators: Sore Pain Intervention(s): Monitored during session;Premedicated before session    Home Living  Prior Function            PT Goals (current goals can now be found in the care plan section) Acute Rehab PT Goals Patient Stated Goal: none stated PT Goal Formulation: With patient Time For Goal Achievement: 11/07/20 Potential to Achieve Goals: Good Progress towards PT goals: Progressing toward goals    Frequency    Min 3X/week      PT Plan Current plan remains appropriate    Co-evaluation              AM-PAC PT "6 Clicks" Mobility   Outcome Measure  Help needed turning from your back  to your side while in a flat bed without using bedrails?: A Little Help needed moving from lying on your back to sitting on the side of a flat bed without using bedrails?: A Little Help needed moving to and from a bed to a chair (including a wheelchair)?: A Lot Help needed standing up from a chair using your arms (e.g., wheelchair or bedside chair)?: A Lot Help needed to walk in hospital room?: A Lot Help needed climbing 3-5 steps with a railing? : A Lot 6 Click Score: 14    End of Session   Activity Tolerance: Patient limited by lethargy Patient left: in chair;with call bell/phone within reach;with chair alarm set Nurse Communication: Mobility status PT Visit Diagnosis: Unsteadiness on feet (R26.81);Other abnormalities of gait and mobility (R26.89)     Time: 3614-4315 PT Time Calculation (min) (ACUTE ONLY): 23 min  Charges:  $Therapeutic Exercise: 8-22 mins $Therapeutic Activity: 8-22 mins                     10/29/2020  Ginger Carne., PT Acute Rehabilitation Services [phone]  (pager) [phone]  (office)   [name] 10/29/2020, 5:57 PM

## 2020-10-29 NOTE — Progress Notes (Signed)
Occupational Therapy Treatment Patient Details Name: [name] MRN: [id number] DOB: [birth year] Today's Date: 10/29/2020   History of present illness 75 y.o. M admitted on 10/22/20 due to AMS. On 9/30 pt had an I&D of LLE abcess. Past medical history of A. 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   OT comments  Patient with increased lethargy and declining out of the bed for today.  Stating repeatedly "not today", confused and asking the same questions.  Initially sat edge of bed with supervision, able to wash face briefly, declined out of bed, and returned to supine.  OT to continue efforts in the acute setting to maximize his functional status, but SNF is needed for post acute rehab prior to returning home.     Recommendations for follow up therapy are one component of a multi-disciplinary discharge planning process, led by the attending physician.  Recommendations may be updated based on patient status, additional functional criteria and insurance authorization.    Follow Up Recommendations  SNF    Equipment Recommendations  3 in 1 bedside commode    Recommendations for Other Services      Precautions / Restrictions Precautions Precautions: Fall Restrictions Weight Bearing Restrictions: No       Mobility Bed Mobility Overal bed mobility: Needs Assistance Bed Mobility: Supine to Sit;Sit to Supine     Supine to sit: Min assist Sit to supine: Min assist     Patient Response: Flat affect  Transfers                 General transfer comment: declined OOB to recliner    Balance Overall balance assessment: Needs assistance Sitting-balance support: Feet supported;Single extremity supported Sitting balance-Leahy Scale: Fair                                     ADL either performed or assessed with clinical judgement   ADL        Grooming: Supervision/safety;Sitting                                                         Cognition Arousal/Alertness: Lethargic Behavior During Therapy: Flat affect Overall Cognitive Status: Impaired/Different from baseline                       Memory: Decreased short-term memory   Safety/Judgement: Decreased awareness of safety;Decreased awareness of deficits Awareness: Emergent Problem Solving: Slow processing;Decreased initiation;Requires verbal cues;Requires tactile cues                            Pertinent Vitals/ Pain       Pain Assessment: Faces Faces Pain Scale: Hurts a little bit Pain Location: leg Pain Descriptors / Indicators: Tender Pain Intervention(s): Monitored during session                                                          Frequency  Min 2X/week        Progress Toward Goals  OT Goals(current goals can now be found in the care plan section)     Acute Rehab OT Goals Patient Stated Goal: none stated OT Goal Formulation: Patient unable to participate in goal setting Time For Goal Achievement: 11/07/20 Potential to Achieve Goals: Good  Plan Discharge plan remains appropriate    Co-evaluation                 AM-PAC OT "6 Clicks" Daily Activity     Outcome Measure   Help from another person eating meals?: A Little Help from another person taking care of personal grooming?: A Little Help from another person toileting, which includes using toliet, bedpan, or urinal?: A Lot Help from another person bathing (including washing, rinsing, drying)?: A Lot Help from another person to put on and taking off regular upper body clothing?: A Lot Help from another person to put on and taking off regular lower body clothing?: A Lot 6 Click Score: 14    End of Session    OT Visit Diagnosis: Unsteadiness on feet (R26.81);Other abnormalities of gait and mobility  (R26.89);Muscle weakness (generalized) (M62.81)   Activity Tolerance Patient limited by lethargy   Patient Left with call bell/phone within reach;in bed;with bed alarm set   Nurse Communication          Time: [phone] OT Time Calculation (min): 15 min  Charges: OT General Charges $OT Visit: 1 Visit OT Treatments $Self Care/Home Management : 8-22 mins  10/29/2020  RP, OTR/L  Acute Rehabilitation Services  Office:  [phone]   Metta Clines 10/29/2020, 9:42 AM

## 2020-10-29 NOTE — Progress Notes (Signed)
HD#7 SUBJECTIVE:  Patient Summary: [name] is a 75 y.o. male (he/him/his) with a pertinent PMH of persistent atrial fibrillation (on Eliquis), complete heart block s/p PPM, grade II HFpEF, hypertension, hypothyroidism, diabetes mellitus, CKD IIIb, who presents to [facility] with altered mental status.   Overnight Events: Per RN, patient keeps removing his condom cath, and urine output cannot be accurately monitored.   Interim History: [name] was evaluated this AM at bedside initially asleep.  He is lethargic, and keeps falling asleep during interview, however was pleasant. A&Ox1, self only, and did not remember this author from previous encounter 2 days prior. States that he feels fine and slept well. He agreed that he feels confused today. He denied pain or SOB, and had no other concerns.   OBJECTIVE:  Vital Signs: Vitals:   10/28/20 1957 10/29/20 0500 10/29/20 0510 10/29/20 1212  BP: (!) 125/53  137/62 126/64  Pulse: 62   63  Resp: 16   19  Temp:    (!) 97.1 F (36.2 C)  TempSrc:    Axillary  SpO2: 99%   96%  Weight:  109.8 kg     Supplemental O2:  Room Air SpO2: 96 % O2 Flow Rate (L/min): 2 L/min  Filed Weights   10/27/20 0500 10/28/20 0500 10/29/20 0500  Weight: 107.5 kg 108.3 kg 109.8 kg   No intake or output data in the 24 hours ending 10/29/20 1533 Net IO Since Admission: -320.41 mL [10/29/20 1533]  Physical Exam: Physical Exam Vitals and nursing note reviewed.  Constitutional:      General: He is not in acute distress.    Appearance: He is ill-appearing.     Comments: A&Ox1, self only.  HENT:     Head: Normocephalic and atraumatic.  Cardiovascular:     Rate and Rhythm: Normal rate and regular rhythm.     Heart sounds:    No gallop.  Pulmonary:     Effort: Pulmonary effort is normal.     Breath sounds: Normal breath sounds.  Abdominal:     Palpations: Abdomen is soft.  Musculoskeletal:     Cervical back: Normal range of motion.     Comments:  Palpable DP pulses. Left lower extremity wrapped with wound-vac in place.  Skin:    General: Skin is warm and dry.  Neurological:     Mental Status: He is alert. He is disoriented.     Comments: Not a baseline     ASSESSMENT/PLAN:  Assessment: Active Problems:   AMS (altered mental status)   Ulcer of left lower extremity with muscle involvement without evidence of necrosis (HCC)  Plan: #Acute Encephalopathy, resolved  #Memory Deficits #Hypoactive delirium Patient presented with altered mental status after being found in his car for >10 hours likely secondary to possible infection, hypothyroidism, urinary retention. Initially on admission, cognitive dysfunction resolved with family endorsing that patient is at baseline. Since then, mentation waxing and waning; suspect hypoactive hospital delirium. He also demonstrates significant memory deficits, however due to delirium, MMSE would not be accurate. Patient refused consent for surgery 10/5, consent was acquired from his brother. Patient is refusing SNF, will contact next of kin (brother) for dispo. - Will obtain MMSE, once at baseline mentation   # LLE Necrotic Ulcer s/p 10/28/2020, POD 1 Secondary to mechanical fall 9/16. Due to poor wound healing in the setting of diabetes and vascular disease, abrasion developed into non-healing ulcer with necrosis. S/p debridement 10/5 with multiple compartment release. Afebrile and no  leukocytosis, we will continue to monitor. He has completed IV vancomycin and cefepime. - Orthopedics following; appreciate their recommendations  - Completed IV vancomycin and cefepime 10/6  - Wound vac for 1 week after discharge (start 10/5) - Weightbearing as tolerated on the left - PT/OT with weight bearing as tolerated; will need SNF placement  - Continue delirium precautions, limiting tubes and lines.  # Acute on chronic macrocytic anemia # Anemia of Chronic Disease Downtrend in hemoglobin over the last 3 months  with no clear source. Despite macrocytic nature, folate and B12 are WNL. Reticulocyte index suggests hypo-proliferation. Hb stable, we will continue to monitor. - Trend hemoglobin  - Transfuse as needed for hemoglobin < 7 - We will have patient follow up outpatient   # Chronic Urinary Incontinence # Chronic Urinary Retention # Hx of BPH # Hx of ESBL Klebsiella UTI Urine output of 275 mL (10/5) was concerned for urinary retention or oliguria. However per RN, he keeps removing his condom cath, resulting in inaccurate reporting. - Bladder scans Q8h  - Continue tamsulosin 0.8 mg daily - Follow-up with urology outpatient   # Recurrent Falls  # History of Peripheral Neuropathy  - Continue gabapentin 300 mg twice daily - SNF placement   #HFpEF (Grade II DD) - Continue home Lasix 20mg  PO - Strict I&O, daily weights   #CKDIII Baseline Cr 1.4-1.5. - Avoid nephrotoxic agents   #Type II Diabetes Mellitus HbA1c 7.8%. - Levemir 5U + SSI - CBG monitoring  - Gabapentin 300 mg twice daily - Will have patient follow-up outpatient for more optimal medical management and encourage lifestyle modification   #Persistent atrial fibrillation on Eliquis #Hx of complete heart block s/p PPM CHA2DS2VASc Score - 5 HASBLED Score- 2.  - Continue Amiodarone - Holding Eliquis at this time for severe anemia   # HTN  - Continue home Hydralazine   # Hypothyroidism - Continue Synthroid 175 mcg  Best Practice: Diet: Cardiac diet IVF: Fluids: none VTE: SCDs Start: 10/29/20 1124 Code: Full Therapy Recs: SNF  DISPO: Anticipated discharge in 1-2 days days to Skilled nursing facility pending  clinical improvement .  Signature: Merrily Brittle, DO Psychiatry Resident, PGY-1 [facility] Teaching Service Pager: [phone] After 5pm on weekdays and 1pm on weekends: On Call pager [phone]  10/29/2020, 3:33 PM

## 2020-10-30 ENCOUNTER — Inpatient Hospital Stay (HOSPITAL_COMMUNITY): Payer: No Typology Code available for payment source

## 2020-10-30 DIAGNOSIS — G9340 Encephalopathy, unspecified: Secondary | ICD-10-CM | POA: Diagnosis not present

## 2020-10-30 LAB — CBC WITH DIFFERENTIAL/PLATELET
Abs Immature Granulocytes: 0.04 10*3/uL (ref 0.00–0.07)
Basophils Absolute: 0.1 10*3/uL (ref 0.0–0.1)
Basophils Relative: 1 %
Eosinophils Absolute: 0.2 10*3/uL (ref 0.0–0.5)
Eosinophils Relative: 2 %
HCT: 24.4 % — ABNORMAL LOW (ref 39.0–52.0)
Hemoglobin: 7.5 g/dL — ABNORMAL LOW (ref 13.0–17.0)
Immature Granulocytes: 1 %
Lymphocytes Relative: 15 %
Lymphs Abs: 1.3 10*3/uL (ref 0.7–4.0)
MCH: 32.3 pg (ref 26.0–34.0)
MCHC: 30.7 g/dL (ref 30.0–36.0)
MCV: 105.2 fL — ABNORMAL HIGH (ref 80.0–100.0)
Monocytes Absolute: 1 10*3/uL (ref 0.1–1.0)
Monocytes Relative: 12 %
Neutro Abs: 5.9 10*3/uL (ref 1.7–7.7)
Neutrophils Relative %: 69 %
Platelets: 165 10*3/uL (ref 150–400)
RBC: 2.32 MIL/uL — ABNORMAL LOW (ref 4.22–5.81)
RDW: 19.9 % — ABNORMAL HIGH (ref 11.5–15.5)
WBC: 8.5 10*3/uL (ref 4.0–10.5)
nRBC: 0 % (ref 0.0–0.2)

## 2020-10-30 LAB — BASIC METABOLIC PANEL
Anion gap: 11 (ref 5–15)
BUN: 31 mg/dL — ABNORMAL HIGH (ref 8–23)
CO2: 22 mmol/L (ref 22–32)
Calcium: 8.6 mg/dL — ABNORMAL LOW (ref 8.9–10.3)
Chloride: 103 mmol/L (ref 98–111)
Creatinine, Ser: 2.21 mg/dL — ABNORMAL HIGH (ref 0.61–1.24)
GFR, Estimated: 30 mL/min — ABNORMAL LOW (ref >60)
Glucose, Bld: 129 mg/dL — ABNORMAL HIGH (ref 70–99)
Potassium: 5.1 mmol/L (ref 3.5–5.1)
Sodium: 136 mmol/L (ref 135–145)

## 2020-10-30 LAB — GLUCOSE, CAPILLARY
Glucose-Capillary: 118 mg/dL — ABNORMAL HIGH (ref 70–99)
Glucose-Capillary: 117 mg/dL — ABNORMAL HIGH (ref 70–99)
Glucose-Capillary: 138 mg/dL — ABNORMAL HIGH (ref 70–99)
Glucose-Capillary: 120 mg/dL — ABNORMAL HIGH (ref 70–99)

## 2020-10-30 LAB — BLOOD GAS, ARTERIAL
Acid-Base Excess: 0.3 mmol/L (ref 0.0–2.0)
Bicarbonate: 24.7 mmol/L (ref 20.0–28.0)
Drawn by: 38235
FIO2: 21
O2 Saturation: 85.7 %
Patient temperature: 37
pCO2 arterial: 42 mmHg (ref 32.0–48.0)
pH, Arterial: 7.387 (ref 7.350–7.450)
pO2, Arterial: 52.7 mmHg — ABNORMAL LOW (ref 83.0–108.0)

## 2020-10-30 LAB — LACTIC ACID, PLASMA: Lactic Acid, Venous: 1.4 mmol/L (ref 0.5–1.9)

## 2020-10-30 LAB — BRAIN NATRIURETIC PEPTIDE: B Natriuretic Peptide: 284 pg/mL — ABNORMAL HIGH (ref 0.0–100.0)

## 2020-10-30 MED ORDER — HYDROMORPHONE HCL 1 MG/ML IJ SOLN
0.5000 mg | Freq: Once | INTRAMUSCULAR | Status: AC
  Administered 2020-10-30: 0.5 mg via INTRAVENOUS
  Filled 2020-10-30: qty 1

## 2020-10-30 MED ORDER — FUROSEMIDE 10 MG/ML IJ SOLN
40.0000 mg | Freq: Once | INTRAMUSCULAR | Status: AC
  Administered 2020-10-30: 40 mg via INTRAVENOUS
  Filled 2020-10-30: qty 4

## 2020-10-30 MED ORDER — APIXABAN 5 MG PO TABS
5.0000 mg | ORAL_TABLET | Freq: Two times a day (BID) | ORAL | Status: DC
Start: 2020-10-30 — End: 2020-11-10
  Administered 2020-11-02 – 2020-11-10 (×17): 5 mg via ORAL
  Filled 2020-10-30 (×19): qty 1

## 2020-10-30 NOTE — Progress Notes (Signed)
Patient at bedside for decreased mental status.  Per RN, patient was verbal yesterday afternoon but has had a decrease in mental status today.  Patient was somnolent but arousable to physical stimulation on exam.  He was not verbal.  Patient was able to move all his extremities without difficulties.  Normal strength of bilateral upper extremities.  Diffuse wheeze of bilateral lung fields.  Chest x-ray ordered this afternoon showed vascular congestion with moderate right pleural effusion which is worse compared to prior his chest x-ray.  He gained 9 pounds since admission.  BNP is also elevated at 284.  ABGs unremarkable.  His worsening mental status can be secondary to his increased work of breathing and possible aspiration.  We will give 1 dose of Lasix 40 mg tonight and monitor urine output.  Given the lack of fever, leukocytosis or lactic acidosis, will not initiate antibiotics at this time.  Will make him n.p.o. until mental status improves.

## 2020-10-30 NOTE — TOC Progression Note (Addendum)
Transition of Care [facility]) - Progression Note    Patient Details  Name: [name] MRN: [id number] Date of Birth: Oct 13, 1945  Transition of [facility]) CM/SW [place], RN Phone Number:[phone]  10/30/2020, 10:03 AM  Clinical Narrative:    CM at bedside to discuss disposition with patient. Patient is alert to self only and is not responsive to questions. Opens eyes and mumbles but CM is unable to have a conversation with patient. CM attempted to call brother who is listed as contact [name] @ [phone]. Brother does not answer phone and there is no option for voicemail.    [place] spoke with brother [name]. [name] states that he is willing to help with the decisions for disposition. Brother has requested that bed search be extended to [place]. Brother  states that he will come to visit patient tomorrow and would like to discuss plan of care for the patient.    Expected Discharge Plan: [place] Barriers to Discharge: Continued Medical Work up  Expected Discharge Plan and Services Expected Discharge Plan: [place] In-house Referral: NA Discharge Planning Services: CM Consult Post Acute Care Choice: [place] Living arrangements for the past 2 months: Single Family Home                 DME Arranged: N/A DME Agency: NA       HH Arranged: NA HH Agency: NA         Social Determinants of Health (SDOH) Interventions    Readmission Risk Interventions Readmission Risk Prevention Plan 10/26/2020  Transportation Screening Complete  PCP or Specialist Appt within 3-5 Days Complete  HRI or Home Care Consult Complete  Social Work Consult for [place] Planning/Counseling Complete  Palliative Care Screening Not Applicable  Medication Review [employer]) Referral to Pharmacy  Some recent data might be hidden

## 2020-10-30 NOTE — Progress Notes (Signed)
HD#8 SUBJECTIVE:  Patient Summary: [name] is a 75 y.o. male (he/him/his) with a pertinent PMH of persistent atrial fibrillation (on Eliquis), complete heart block s/p PPM, grade II HFpEF, hypertension, hypothyroidism, diabetes mellitus, CKD IIIb, who presents to [facility] with altered mental status.   Overnight Events: Required [place] 2L last night, but then returned to room air. Bladder scan showed 276mL.  Interim History: [name] was evaluated this AM at bedside.  He was awake, however altered. He was averbal, except said "yes" to everything, and not able to follow commands.  Patient was not able to tell us whether he was in pain or not.   OBJECTIVE:  Vital Signs: Vitals:   10/29/20 1212 10/29/20 1956 10/30/20 0309 10/30/20 0833  BP: 126/64 (!) 143/67 (!) 134/59 (!) 111/44  Pulse: 63 62 60 (!) 59  Resp: 19 20 20 18   Temp: (!) 97.1 F (36.2 C) 97.7 F (36.5 C) 98.2 F (36.8 C) 98.4 F (36.9 C)  TempSrc: Axillary Oral Oral Axillary  SpO2: 96% 98% 95%   Weight:   111.1 kg    Supplemental O2:  [place]  SpO2: 95 % O2 Flow Rate (L/min): 2 L/min  Filed Weights   10/28/20 0500 10/29/20 0500 10/30/20 0309  Weight: 108.3 kg 109.8 kg 111.1 kg    Intake/Output Summary (Last 24 hours) at 10/30/2020 1524 Last data filed at 10/29/2020 1958 Gross per 24 hour  Intake 59.89 ml  Output 200 ml  Net -140.11 ml   Net IO Since Admission: -460.52 mL [10/30/20 1524]  Physical Exam: from 10/6 Physical Exam Vitals and nursing note reviewed.  Constitutional:      General: He is not in acute distress.    Appearance: He is ill-appearing.     Comments: A&Ox1, self only.  HENT:     Head: Normocephalic and atraumatic.  Cardiovascular:     Rate and Rhythm: Normal rate and regular rhythm.     Heart sounds:    No gallop.  Pulmonary:     Effort: Pulmonary effort is normal.     Breath sounds: Normal breath sounds.  Abdominal:     Palpations: Abdomen is soft.  Musculoskeletal:     Cervical  back: Normal range of motion.     Comments: Palpable DP pulses. Left lower extremity wrapped with wound-vac in place.  Skin:    General: Skin is warm and dry.  Neurological:     Mental Status: He is alert. He is disoriented.     Comments: Not a baseline     ASSESSMENT/PLAN:  Assessment: Active Problems:   AMS (altered mental status)   Ulcer of left lower extremity with muscle involvement without evidence of necrosis (HCC)  Plan: #Acute Encephalopathy, resolved  #Memory Deficits #Hypoactive delirium Patient presented with altered mental status after being found in his car for >10 hours likely secondary to possible infection, hypothyroidism, urinary retention.  Patient's delirium is worse, as described above.  Unsure if patient is in pain or some other cause. Will stop excessive sedating meds, such as his gabapentin.  Also give him 1 dose of IV Dilaudid 0.5 mg and monitor closely, in case pain is the reason for his worsening delirium.  We will obtain CXR in case of aspiration ammonia. - Dilaudid 0.5 mg IV once only - Follow-up chest x-ray - DC home gabapentin - Will obtain MMSE, once at baseline mentation - Continue delirium precautions, limiting tubes and lines.   # LLE Necrotic Ulcer s/p debridement 10/28/2020,  POD 2  Secondary to mechanical fall 9/16. Due to poor wound healing in the setting of diabetes and vascular disease, abrasion developed into non-healing ulcer with necrosis. S/p debridement 10/5 with multiple compartment release. Afebrile and no leukocytosis, we will continue to monitor.  - Orthopedics following; appreciate their recommendations  - Cefazolin 2 g, ends 11:59 AM 10/7 - Wound vac for 1 week after discharge (start 10/5) - Weightbearing as tolerated on the left - PT/OT with weight bearing as tolerated; will need SNF placement   # Acute on chronic macrocytic anemia # Anemia of Chronic Disease Downtrend in hemoglobin over the last 3 months with no clear source.  Despite macrocytic nature, folate and B12 are WNL. Reticulocyte index suggests hypo-proliferation. Hb stable, we will continue to monitor. - Trend hemoglobin  - Transfuse as needed for hemoglobin < 7 - We will have patient follow up outpatient   # Chronic Urinary Incontinence # Chronic Urinary Retention # Hx of BPH # Hx of ESBL Klebsiella UTI Difficult to quantify urinary output, as patient he is removing his condom cath. - Bladder scans Q8h  - Continue tamsulosin 0.8 mg daily - Follow-up with urology outpatient   #HFpEF (Grade II DD) - Continue home Lasix 20mg  PO - Strict I&O, daily weights  # Recurrent Falls  # History of Peripheral Neuropathy  - DC gabapentin 300 mg twice daily for delirium precautions - SNF placement   #CKDIII Baseline Cr 1.4-1.5. - Avoid nephrotoxic agents   #Type II Diabetes Mellitus HbA1c 7.8%. - Levemir 5U + SSI - CBG monitoring  - DC Gabapentin 300 mg twice daily, for above - Will have patient follow-up outpatient for more optimal medical management and encourage lifestyle modification   #Persistent atrial fibrillation on Eliquis #Hx of complete heart block s/p PPM CHA2DS2VASc Score - 5 HASBLED Score- 2.  Restarting home Eliquis, as hematoma has been evacuated by ortho, and that Hb has been stable. - Continue Amiodarone - Restart home Eliquis 5mg  BID   # HTN  - Continue home Hydralazine 25mg  q8h   # Hypothyroidism - Continue Synthroid 175 mcg  Best Practice: Diet: Cardiac diet IVF: Fluids: none VTE: SCDs Start: 10/29/20 1124 Code: Full Therapy Recs: SNF, and DME 3 in 1 bedside commode  DISPO: Anticipated discharge in 1-2 days days to Skilled nursing facility pending  clinical improvement .  Signature: Merrily Brittle, DO Psychiatry Resident, PGY-1 [facility] Teaching Service Pager: [phone] After 5pm on weekdays and 1pm on weekends: On Call pager [phone]  10/30/2020, 3:24 PM

## 2020-10-30 NOTE — Plan of Care (Signed)
  Problem: Clinical Measurements: Goal: Cardiovascular complication will be avoided Outcome: Progressing   Problem: Activity: Goal: Risk for activity intolerance will decrease Outcome: Progressing   Problem: Coping: Goal: Level of anxiety will decrease Outcome: Progressing   Problem: Elimination: Goal: Will not experience complications related to bowel motility Outcome: Progressing   Problem: Safety: Goal: Ability to remain free from injury will improve Outcome: Progressing   Problem: Skin Integrity: Goal: Risk for impaired skin integrity will decrease Outcome: Progressing   Problem: Education: Goal: Knowledge of General Education information will improve Description: Including pain rating scale, medication(s)/side effects and non-pharmacologic comfort measures Outcome: Not Progressing   Problem: Nutrition: Goal: Adequate nutrition will be maintained Outcome: Not Progressing   Problem: Elimination: Goal: Will not experience complications related to urinary retention Outcome: Not Progressing   Problem: Pain Managment: Goal: General experience of comfort will improve Outcome: Not Progressing

## 2020-10-31 ENCOUNTER — Inpatient Hospital Stay (HOSPITAL_COMMUNITY): Payer: No Typology Code available for payment source

## 2020-10-31 DIAGNOSIS — G9340 Encephalopathy, unspecified: Secondary | ICD-10-CM | POA: Diagnosis not present

## 2020-10-31 LAB — BASIC METABOLIC PANEL
Anion gap: 9 (ref 5–15)
BUN: 30 mg/dL — ABNORMAL HIGH (ref 8–23)
CO2: 26 mmol/L (ref 22–32)
Calcium: 9.1 mg/dL (ref 8.9–10.3)
Chloride: 102 mmol/L (ref 98–111)
Creatinine, Ser: 2.14 mg/dL — ABNORMAL HIGH (ref 0.61–1.24)
GFR, Estimated: 31 mL/min — ABNORMAL LOW (ref >60)
Glucose, Bld: 110 mg/dL — ABNORMAL HIGH (ref 70–99)
Potassium: 4.1 mmol/L (ref 3.5–5.1)
Sodium: 137 mmol/L (ref 135–145)

## 2020-10-31 LAB — CBC WITH DIFFERENTIAL/PLATELET
Abs Immature Granulocytes: 0.07 10*3/uL (ref 0.00–0.07)
Basophils Absolute: 0.1 10*3/uL (ref 0.0–0.1)
Basophils Relative: 1 %
Eosinophils Absolute: 0.2 10*3/uL (ref 0.0–0.5)
Eosinophils Relative: 2 %
HCT: 27.2 % — ABNORMAL LOW (ref 39.0–52.0)
Hemoglobin: 8.8 g/dL — ABNORMAL LOW (ref 13.0–17.0)
Immature Granulocytes: 1 %
Lymphocytes Relative: 15 %
Lymphs Abs: 1.3 10*3/uL (ref 0.7–4.0)
MCH: 33.1 pg (ref 26.0–34.0)
MCHC: 32.4 g/dL (ref 30.0–36.0)
MCV: 102.3 fL — ABNORMAL HIGH (ref 80.0–100.0)
Monocytes Absolute: 1.3 10*3/uL — ABNORMAL HIGH (ref 0.1–1.0)
Monocytes Relative: 14 %
Neutro Abs: 5.8 10*3/uL (ref 1.7–7.7)
Neutrophils Relative %: 67 %
Platelets: 198 10*3/uL (ref 150–400)
RBC: 2.66 MIL/uL — ABNORMAL LOW (ref 4.22–5.81)
RDW: 19.5 % — ABNORMAL HIGH (ref 11.5–15.5)
WBC: 8.7 10*3/uL (ref 4.0–10.5)
nRBC: 0 % (ref 0.0–0.2)

## 2020-10-31 LAB — GLUCOSE, CAPILLARY
Glucose-Capillary: 95 mg/dL (ref 70–99)
Glucose-Capillary: 96 mg/dL (ref 70–99)
Glucose-Capillary: 118 mg/dL — ABNORMAL HIGH (ref 70–99)
Glucose-Capillary: 159 mg/dL — ABNORMAL HIGH (ref 70–99)

## 2020-10-31 LAB — LACTIC ACID, PLASMA: Lactic Acid, Venous: 1.4 mmol/L (ref 0.5–1.9)

## 2020-10-31 MED ORDER — LORATADINE 10 MG PO TABS
10.0000 mg | ORAL_TABLET | Freq: Once | ORAL | Status: DC
Start: 2020-10-31 — End: 2020-11-03
  Filled 2020-10-31: qty 1

## 2020-10-31 MED ORDER — SENNOSIDES-DOCUSATE SODIUM 8.6-50 MG PO TABS
1.0000 | ORAL_TABLET | Freq: Every day | ORAL | Status: DC
  Filled 2020-10-31: qty 1

## 2020-10-31 MED ORDER — DIPHENHYDRAMINE HCL 50 MG/ML IJ SOLN
12.5000 mg | Freq: Once | INTRAMUSCULAR | Status: AC
  Administered 2020-10-31: 12.5 mg via INTRAVENOUS
  Filled 2020-10-31: qty 1

## 2020-10-31 MED ORDER — POLYETHYLENE GLYCOL 3350 17 G PO PACK
17.0000 g | PACK | Freq: Every day | ORAL | Status: DC
  Filled 2020-10-31: qty 1

## 2020-10-31 MED ORDER — DIPHENHYDRAMINE HCL 50 MG/ML IJ SOLN: 12.5000 mg | Freq: Once | INTRAMUSCULAR | Status: DC

## 2020-10-31 NOTE — Plan of Care (Signed)

## 2020-10-31 NOTE — Progress Notes (Addendum)
HD#9 SUBJECTIVE:  Patient Summary: [name] is a 75 y.o. male (he/him/his) with a pertinent PMH of persistent atrial fibrillation (on Eliquis), complete heart block s/p PPM, grade II HFpEF, hypertension, hypothyroidism, diabetes mellitus, CKD IIIb, who presents to [facility] with altered mental status.   Overnight Events: CXR revealed right-sided pleural effusion. RN noted new b/l periorbital edema.  Interim History: [name] was evaluated this AM at bedside. Patient is still altered, averbal, unable to follow commands, and in fetal position writhing x2 days.   OBJECTIVE:  Vital Signs: Vitals:   10/30/20 0833 10/30/20 2000 10/31/20 0500 10/31/20 0759  BP: (!) 111/44 121/60  (!) 159/49  Pulse: (!) 59 67  68  Resp: 18 18  18   Temp: 98.4 F (36.9 C) 98.1 F (36.7 C)    TempSrc: Axillary Rectal    SpO2:  91%  90%  Weight:   107.6 kg    Supplemental O2:  [place]  SpO2: 90 % O2 Flow Rate (L/min): 1 L/min  Filed Weights   10/29/20 0500 10/30/20 0309 10/31/20 0500  Weight: 109.8 kg 111.1 kg 107.6 kg    Intake/Output Summary (Last 24 hours) at 10/31/2020 1106 Last data filed at 10/31/2020 0500 Gross per 24 hour  Intake --  Output 600 ml  Net -600 ml   Net IO Since Admission: -1,060.52 mL [10/31/20 1106]  Physical Exam:  Physical Exam Vitals and nursing note reviewed.  Constitutional:      General: He is in acute distress.     Appearance: He is ill-appearing. He is not toxic-appearing or diaphoretic.     Comments: Roled onto R-side in fetal position, averbal, will not follow commands  HENT:     Head: Normocephalic and atraumatic.     Mouth/Throat:     Mouth: Mucous membranes are dry.  Eyes:     General:        Right eye: Discharge present.     Extraocular Movements: Extraocular movements intact.     Conjunctiva/sclera: Conjunctivae normal.     Comments: B/l periorbital edema, non-pitting. Some crusty discharge on right lashline  Cardiovascular:     Rate and Rhythm:  Normal rate and regular rhythm.     Heart sounds:    No gallop.  Pulmonary:     Effort: Pulmonary effort is normal.     Breath sounds: Rales (RLL, RML) present. No wheezing.  Abdominal:     General: Bowel sounds are normal. There is no distension.     Palpations: Abdomen is soft.  Musculoskeletal:     Cervical back: Normal range of motion.     Right lower leg: No edema.     Left lower leg: No edema.     Comments: Palpable DP pulses. Left lower extremity wrapped with wound-vac in place.  Skin:    General: Skin is warm and dry.     Findings: No rash.  Neurological:     Mental Status: He is alert. He is disoriented.     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  Patient's delirium is worse, as described above. CXR showed vascular congestion with moderate right pleural effusion. BNP  elevated at 284. ABGs showed pO2 52.7. Patient has 1L [place], but keeps removing it. Ddx aspiration pna, HF exacerbation, post-surgical pain, and resulting hypoxia likely contributing to delirium. Received 1 dose IV lasix 80mg , with 649mL and 4x unmeasured urine output because he keeps pulling off his external catheter. Patient refusing meds, will convert to IV. Will repeat CXR to see if pleural effusion is relieved from diuresis last night. Will hold off on starting antibiotics, since patient is afebrile and has no leukocytosis. - Will obtain MMSE, once at baseline mentation - Continue delirium precautions, limiting tubes and lines. - Follow-up repeat CXR  #Periorbital Edema, bilateral Likely allergic reaction to unsure  of what currently vs dependent edema from hypervolemia. This is new, RN noticed within a few hours. No respiratory distress or rash noted. Only new meds was IV lasix 1x.  Unrelieved by IM Benadryl.  We will elevating head of bed and consider diuresing. However patient's Cr 2.1 from 1.7.  # LLE Necrotic Ulcer s/p debridement 10/28/2020, POD 3 Secondary to mechanical fall 9/16. Due to poor wound healing in the setting of diabetes and vascular disease, abrasion developed into non-healing ulcer with necrosis. S/p debridement 10/5 with multiple compartment release. Afebrile and no leukocytosis, we will continue to monitor.  - Orthopedics following; appreciate their recommendations  - Wound vac for 1 week after discharge (start 10/5) - Weightbearing as tolerated on the left - PT/OT with weight bearing as tolerated; will need SNF placement   # Acute on chronic macrocytic anemia # Anemia of Chronic Disease Downtrend in hemoglobin over the last 3 months with no clear source. Despite macrocytic nature, folate and B12 are WNL. Reticulocyte index suggests hypo-proliferation. Hb stable, we will continue to monitor. - Trend hemoglobin  - Transfuse as needed for hemoglobin < 7 - We will have patient follow up outpatient   # Chronic Urinary Incontinence # Hx of BPH # Hx of ESBL Klebsiella UTI Difficult to quantify urinary output, as patient he is removing his condom cath.  - Bladder scans Q8h  - Continue tamsulosin 0.8 mg daily - Follow-up with urology outpatient   #HFpEF (Grade II DD) - Continue home Lasix 20mg  PO - Strict I&O, daily weights  # Recurrent Falls  # History of Peripheral Neuropathy  - DC gabapentin 300 mg twice daily for delirium precautions - SNF placement   #AoCKDIII Baseline Cr 1.4-1.5. Likely from recent IV Lasix. - Avoid nephrotoxic agents   #Type II Diabetes Mellitus HbA1c 7.8%. - Levemir 5U + SSI - CBG monitoring  - DC Gabapentin 300 mg twice daily, for above - Will  have patient follow-up outpatient for more optimal medical management and encourage lifestyle modification   #Persistent atrial fibrillation on Eliquis #Hx of complete heart block s/p PPM CHA2DS2VASc Score - 5 HASBLED Score- 2.  Restarting home Eliquis, as hematoma has been evacuated by ortho, and that Hb has been stable. - Continue Amiodarone - Continue home Eliquis 5mg  BID   # HTN  - Continue home Hydralazine 25mg  q8h   # Hypothyroidism - Continue Synthroid 175 mcg  Best Practice: Diet: Cardiac diet IVF: Fluids: none VTE: SCDs Start: 10/29/20 1124 Code: Full Therapy Recs: SNF, and DME 3 in 1 bedside commode  DISPO: Anticipated discharge in 1-2 days days to Skilled nursing facility pending  clinical improvement .  Signature: Merrily Brittle, DO Psychiatry Resident, PGY-1 [facility] Teaching Service Pager: [phone] After 5pm on weekdays and 1pm on weekends: On Call pager [phone]  10/31/2020, 11:06 AM

## 2020-11-01 DIAGNOSIS — G9340 Encephalopathy, unspecified: Secondary | ICD-10-CM | POA: Diagnosis not present

## 2020-11-01 LAB — COMPREHENSIVE METABOLIC PANEL WITH GFR
ALT: 7 U/L (ref 0–44)
AST: 21 U/L (ref 15–41)
Albumin: 2.5 g/dL — ABNORMAL LOW (ref 3.5–5.0)
Alkaline Phosphatase: 63 U/L (ref 38–126)
Anion gap: 11 (ref 5–15)
BUN: 31 mg/dL — ABNORMAL HIGH (ref 8–23)
CO2: 23 mmol/L (ref 22–32)
Calcium: 8.9 mg/dL (ref 8.9–10.3)
Chloride: 104 mmol/L (ref 98–111)
Creatinine, Ser: 2.2 mg/dL — ABNORMAL HIGH (ref 0.61–1.24)
GFR, Estimated: 30 mL/min — ABNORMAL LOW
Glucose, Bld: 152 mg/dL — ABNORMAL HIGH (ref 70–99)
Potassium: 4.1 mmol/L (ref 3.5–5.1)
Sodium: 138 mmol/L (ref 135–145)
Total Bilirubin: 1 mg/dL (ref 0.3–1.2)
Total Protein: 6.6 g/dL (ref 6.5–8.1)

## 2020-11-01 LAB — GLUCOSE, CAPILLARY
Glucose-Capillary: 110 mg/dL — ABNORMAL HIGH (ref 70–99)
Glucose-Capillary: 124 mg/dL — ABNORMAL HIGH (ref 70–99)
Glucose-Capillary: 150 mg/dL — ABNORMAL HIGH (ref 70–99)
Glucose-Capillary: 123 mg/dL — ABNORMAL HIGH (ref 70–99)

## 2020-11-01 LAB — CBC WITH DIFFERENTIAL/PLATELET
Abs Immature Granulocytes: 0.09 K/uL — ABNORMAL HIGH (ref 0.00–0.07)
Basophils Absolute: 0.1 K/uL (ref 0.0–0.1)
Basophils Relative: 1 %
Eosinophils Absolute: 0 K/uL (ref 0.0–0.5)
Eosinophils Relative: 1 %
HCT: 28.1 % — ABNORMAL LOW (ref 39.0–52.0)
Hemoglobin: 9 g/dL — ABNORMAL LOW (ref 13.0–17.0)
Immature Granulocytes: 1 %
Lymphocytes Relative: 10 %
Lymphs Abs: 0.9 K/uL (ref 0.7–4.0)
MCH: 33.1 pg (ref 26.0–34.0)
MCHC: 32 g/dL (ref 30.0–36.0)
MCV: 103.3 fL — ABNORMAL HIGH (ref 80.0–100.0)
Monocytes Absolute: 1.1 K/uL — ABNORMAL HIGH (ref 0.1–1.0)
Monocytes Relative: 13 %
Neutro Abs: 6.6 K/uL (ref 1.7–7.7)
Neutrophils Relative %: 74 %
Platelets: 197 K/uL (ref 150–400)
RBC: 2.72 MIL/uL — ABNORMAL LOW (ref 4.22–5.81)
RDW: 19.5 % — ABNORMAL HIGH (ref 11.5–15.5)
WBC: 8.8 K/uL (ref 4.0–10.5)
nRBC: 0 % (ref 0.0–0.2)

## 2020-11-01 LAB — MAGNESIUM: Magnesium: 2.4 mg/dL (ref 1.7–2.4)

## 2020-11-01 MED ORDER — FUROSEMIDE 10 MG/ML IJ SOLN
40.0000 mg | Freq: Once | INTRAMUSCULAR | Status: AC
  Administered 2020-11-01: 40 mg via INTRAVENOUS
  Filled 2020-11-01: qty 4

## 2020-11-01 NOTE — Plan of Care (Signed)
  Problem: Clinical Measurements: Goal: Will remain free from infection Outcome: Progressing   Problem: Education: Goal: Knowledge of General Education information will improve Description: Including pain rating scale, medication(s)/side effects and non-pharmacologic comfort measures Outcome: Not Progressing   Problem: Health Behavior/Discharge Planning: Goal: Ability to manage health-related needs will improve Outcome: Not Progressing   Problem: Clinical Measurements: Goal: Ability to maintain clinical measurements within normal limits will improve Outcome: Not Progressing Goal: Diagnostic test results will improve Outcome: Not Progressing Goal: Respiratory complications will improve Outcome: Not Progressing   Problem: Activity: Goal: Risk for activity intolerance will decrease Outcome: Not Progressing   Problem: Nutrition: Goal: Adequate nutrition will be maintained Outcome: Not Progressing   Problem: Coping: Goal: Level of anxiety will decrease Outcome: Not Progressing   Problem: Elimination: Goal: Will not experience complications related to bowel motility Outcome: Not Progressing Goal: Will not experience complications related to urinary retention Outcome: Not Progressing   Problem: Pain Managment: Goal: General experience of comfort will improve Outcome: Not Progressing

## 2020-11-01 NOTE — Progress Notes (Signed)
HD#10 SUBJECTIVE:  Patient Summary: [name] is a 75 y.o. male (he/him/his) with a pertinent PMH of persistent atrial fibrillation (on Eliquis), complete heart block s/p PPM, grade II HFpEF, hypertension, hypothyroidism, diabetes mellitus, CKD IIIb, who presents to [facility] with altered mental status.   Overnight Events: Continues to refuse med.  Interim History: [name] was evaluated this AM at bedside. Patient is still altered, averbal, NPO, unable to follow commands, and in fetal position and writhing x3 days.  OBJECTIVE:  Vital Signs: Vitals:   10/31/20 1741 10/31/20 2100 11/01/20 0200 11/01/20 0500  BP: (!) 153/58 (!) 157/68 (!) 164/60 (!) 160/119  Pulse: 90 91 67 62  Resp: 17 18 18 16   Temp:  98.1 F (36.7 C) 97.9 F (36.6 C) 98 F (36.7 C)  TempSrc:  Rectal Axillary Axillary  SpO2: (!) 89% 94% 96% 99%  Weight:    104.3 kg   Supplemental O2:  [place]  - But keeps pulling it off SpO2: 99 % O2 Flow Rate (L/min): 1 L/min  Filed Weights   10/30/20 0309 10/31/20 0500 11/01/20 0500  Weight: 111.1 kg 107.6 kg 104.3 kg   No intake or output data in the 24 hours ending 11/01/20 1344  Net IO Since Admission: -1,060.52 mL [11/01/20 1344]  Physical Exam:  Physical Exam Vitals and nursing note reviewed.  Constitutional:      General: He is in acute distress.     Appearance: He is ill-appearing. He is not toxic-appearing or diaphoretic.     Comments: Roled onto R-side in fetal position, averbal, will not follow commands, see above for additional details  HENT:     Head: Normocephalic and atraumatic.     Mouth/Throat:     Mouth: Mucous membranes are dry.  Eyes:     Extraocular Movements: Extraocular movements intact.     Conjunctiva/sclera: Conjunctivae normal.  Cardiovascular:     Rate and Rhythm: Normal rate and regular rhythm.     Heart sounds:    No gallop.  Pulmonary:     Effort: Pulmonary effort is normal.     Breath sounds: Rales (RLL, much improved, barely  audible, difficult to auscultate d/t altered state) present. No wheezing.  Abdominal:     General: Bowel sounds are normal. There is no distension.     Palpations: Abdomen is soft.  Musculoskeletal:     Cervical back: Normal range of motion.     Right lower leg: No edema.     Left lower leg: No edema.     Comments: Palpable DP pulses. Left lower extremity wrapped with wound-vac in place.  Skin:    General: Skin is warm and dry.     Findings: No rash.  Neurological:     Mental Status: He is alert. He is disoriented.     Comments: Not a baseline     ASSESSMENT/PLAN:  Assessment: Active Problems:   AMS (altered mental status)   Ulcer of left lower extremity with muscle involvement without evidence of necrosis (HCC)  Plan: #Acute Encephalopathy #Hyperactive Delirium Presented with AMS after being found in his car for >10 hours. This initially improved, but he is acutely altered again, likely multifactorial. Ddx: pleural effusion,  post-surgical pain, constipation, and resulting hypoxia likely contributing to delirium. Patient refusing meds, will convert to IV. Will treat etiologies, seen below. - Continue delirium precautions, limiting tubes and lines.  #Right pleural effusion #HFpEF (Grade II DD) Evident on CXR. Likely 2/2 to CHF. Since patient is  afebrile with no leukocytosis, and NPO, makes aspiration pna less of a concern right now. Will start antibiotics if evidence of infection show. Will diurese today. - Lasix 40 mg IV - Held home Lasix 20mg  PO, as patient keeps refusing meds - Strict I&O, daily weights  #Constipation Last recorded BM was 9/30. Daily bowel regimen of MiraLAX was unsuccessful. -Soap suds enema  # LLE Necrotic Ulcer s/p debridement 10/28/2020, POD 4 Secondary to mechanical fall 9/16. Due to poor wound healing in the setting of diabetes and vascular disease, abrasion developed into non-healing ulcer with necrosis. S/p debridement 10/5 with multiple compartment  release. Afebrile and no leukocytosis, we will continue to monitor.  - Orthopedics following; appreciate their recommendations  - Wound vac for 1 week after discharge (start 10/5) - Weightbearing as tolerated on the left - PT/OT with weight bearing as tolerated; will need SNF placement   #Likely Dementia Memory deficits were made obvious during hospitalization. - Will obtain MMSE, once at baseline mentation  # Acute on chronic macrocytic anemia # Anemia of Chronic Disease Downtrend in hemoglobin over the last 3 months with no clear source. Despite macrocytic nature, folate and B12 are WNL. Reticulocyte index suggests hypo-proliferation. Hb stable, we will continue to monitor. - Trend hemoglobin  - Transfuse as needed for hemoglobin < 7 - We will have patient follow up outpatient   # Chronic Urinary Incontinence # Hx of BPH # Hx of ESBL Klebsiella UTI Difficult to quantify urinary output, as patient he is removing his condom cath.  - Bladder scans Q8h  - Continue tamsulosin 0.8 mg daily - Follow-up with urology outpatient  # Recurrent Falls  # History of Peripheral Neuropathy  - DC gabapentin 300 mg twice daily for delirium precautions - SNF placement   #AoCKDIII Baseline Cr 1.4-1.5. Likely from recent IV Lasix. - Avoid nephrotoxic agents   #Type II Diabetes Mellitus HbA1c 7.8%. - Levemir 5U + SSI - CBG monitoring  - DC Gabapentin 300 mg twice daily, for above - Will have patient follow-up outpatient for more optimal medical management and encourage lifestyle modification   #Persistent atrial fibrillation on Eliquis #Hx of complete heart block s/p PPM CHA2DS2VASc Score - 5 HASBLED Score- 2.  Restarting home Eliquis, as hematoma has been evacuated by ortho, and that Hb has been stable. - Continue Amiodarone - Continue home Eliquis 5mg  BID   # HTN  - Continue home Hydralazine 25mg  q8h   # Hypothyroidism - Continue Synthroid 175 mcg  Best Practice: Diet: Cardiac  diet IVF: Fluids: none VTE: SCDs Start: 10/29/20 1124 Code: Full Therapy Recs: SNF, and DME 3 in 1 bedside commode  DISPO: Anticipated discharge in 1-2 days days to Skilled nursing facility pending  clinical improvement .  Signature: Merrily Brittle, DO Psychiatry Resident, PGY-1 [facility] Teaching Service Pager: [phone] After 5pm on weekdays and 1pm on weekends: On Call pager [phone]  11/01/2020, 1:44 PM

## 2020-11-02 DIAGNOSIS — G9340 Encephalopathy, unspecified: Secondary | ICD-10-CM | POA: Diagnosis not present

## 2020-11-02 LAB — BASIC METABOLIC PANEL
Anion gap: 12 (ref 5–15)
BUN: 28 mg/dL — ABNORMAL HIGH (ref 8–23)
CO2: 23 mmol/L (ref 22–32)
Calcium: 8.8 mg/dL — ABNORMAL LOW (ref 8.9–10.3)
Chloride: 105 mmol/L (ref 98–111)
Creatinine, Ser: 2.18 mg/dL — ABNORMAL HIGH (ref 0.61–1.24)
GFR, Estimated: 30 mL/min — ABNORMAL LOW (ref >60)
Glucose, Bld: 138 mg/dL — ABNORMAL HIGH (ref 70–99)
Potassium: 3.5 mmol/L (ref 3.5–5.1)
Sodium: 140 mmol/L (ref 135–145)

## 2020-11-02 LAB — CBC
HCT: 28 % — ABNORMAL LOW (ref 39.0–52.0)
Hemoglobin: 8.8 g/dL — ABNORMAL LOW (ref 13.0–17.0)
MCH: 32.6 pg (ref 26.0–34.0)
MCHC: 31.4 g/dL (ref 30.0–36.0)
MCV: 103.7 fL — ABNORMAL HIGH (ref 80.0–100.0)
Platelets: 239 10*3/uL (ref 150–400)
RBC: 2.7 MIL/uL — ABNORMAL LOW (ref 4.22–5.81)
RDW: 19.2 % — ABNORMAL HIGH (ref 11.5–15.5)
WBC: 9.6 10*3/uL (ref 4.0–10.5)
nRBC: 0 % (ref 0.0–0.2)

## 2020-11-02 LAB — GLUCOSE, CAPILLARY
Glucose-Capillary: 100 mg/dL — ABNORMAL HIGH (ref 70–99)
Glucose-Capillary: 121 mg/dL — ABNORMAL HIGH (ref 70–99)
Glucose-Capillary: 145 mg/dL — ABNORMAL HIGH (ref 70–99)
Glucose-Capillary: 120 mg/dL — ABNORMAL HIGH (ref 70–99)

## 2020-11-02 NOTE — Progress Notes (Signed)
Physical Therapy Treatment Patient Details Name: [name] MRN: [id number] DOB: 1946/01/23 Today's Date: 11/02/2020   History of Present Illness 75 y.o. M admitted on 10/22/20 due to AMS. On 9/30 pt had an I&D of LLE abcess. 10/5 L LE debridement with compartment releases and application of VAC dressing.   Past medical history of A. 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    PT Comments    Patient received in bed, pleasantly confused; oriented to self and location, but quite confused when it came to time of year and reason why he is here in the hospital. Perseverated on asking "what stung me" and  saying "go ask them before I stand, you better talk to them first". Very difficult to redirect and only following cues about 30% of the time which really limited session today. Unsafe to attempt transfers/gait as he did become physically resistive. Left in bed with all needs met, bed alarm active. Will continue efforts, for now continue to recommend SNF.     Recommendations for follow up therapy are one component of a multi-disciplinary discharge planning process, led by the attending physician.  Recommendations may be updated based on patient status, additional functional criteria and insurance authorization.  Follow Up Recommendations  SNF     Equipment Recommendations  Other (comment) (TBD)    Recommendations for Other Services       Precautions / Restrictions Precautions Precautions: Fall Restrictions Weight Bearing Restrictions: No     Mobility  Bed Mobility Overal bed mobility: Needs Assistance Bed Mobility: Supine to Sit;Sit to Supine     Supine to sit: Min assist Sit to supine: Mod assist;+2 for physical assistance   General bed mobility comments: MinA to get to EOB and able to maintain midline balance once there, however did need ModAx2 to  return to bed due to poor command following    Transfers                 General transfer comment: refused  Ambulation/Gait             General Gait Details: refused   Stairs             Wheelchair Mobility    Modified Rankin (Stroke Patients Only)       Balance Overall balance assessment: Needs assistance Sitting-balance support: Feet supported Sitting balance-Leahy Scale: Good Sitting balance - Comments: preference for BUE support                                    Cognition Arousal/Alertness: Awake/alert Behavior During Therapy: Flat affect Overall Cognitive Status: Impaired/Different from baseline Area of Impairment: Orientation;Memory;Safety/judgement;Awareness;Problem solving;Following commands                 Orientation Level: Disoriented to;Time;Situation   Memory: Decreased short-term memory Following Commands: Follows one step commands inconsistently Safety/Judgement: Decreased awareness of safety;Decreased awareness of deficits Awareness: Intellectual Problem Solving: Slow processing;Decreased initiation;Requires verbal cues;Requires tactile cues;Difficulty sequencing General Comments: oriented to self and city, unable to tell me time of year and why he is here. Perseverated on me "needing to talk to them" before he would try to get up. Only followed cues about 30% of the time today, making it difficult to progress him, also perseverated on asking me "what stung me?". Pleasant but difficult to redirect.  Exercises      General Comments        Pertinent Vitals/Pain Pain Assessment: Faces Faces Pain Scale: No hurt Pain Intervention(s): Limited activity within patient's tolerance;Monitored during session    Home Living                      Prior Function            PT Goals (current goals can now be found in the care plan section) Acute Rehab PT Goals Patient Stated Goal: none stated PT Goal  Formulation: With patient Time For Goal Achievement: 11/07/20 Potential to Achieve Goals: Good Progress towards PT goals: Not progressing toward goals - comment (limited by cognition/confusion today)    Frequency    Min 3X/week      PT Plan Current plan remains appropriate    Co-evaluation              AM-PAC PT "6 Clicks" Mobility   Outcome Measure  Help needed turning from your back to your side while in a flat bed without using bedrails?: A Little Help needed moving from lying on your back to sitting on the side of a flat bed without using bedrails?: A Little Help needed moving to and from a bed to a chair (including a wheelchair)?: Total Help needed standing up from a chair using your arms (e.g., wheelchair or bedside chair)?: Total Help needed to walk in hospital room?: Total Help needed climbing 3-5 steps with a railing? : Total 6 Click Score: 10    End of Session   Activity Tolerance: Other (comment) (limited by cognition/poor command following) Patient left: in bed;with call bell/phone within reach;with bed alarm set Nurse Communication: Mobility status PT Visit Diagnosis: Unsteadiness on feet (R26.81);Other abnormalities of gait and mobility (R26.89)     Time: 4451-4604 PT Time Calculation (min) (ACUTE ONLY): 14 min  Charges:  $Therapeutic Activity: 8-22 mins                    Windell Norfolk, DPT, PN2   Supplemental Physical Therapist [place]    Pager [phone] Acute Rehab Office [phone]

## 2020-11-02 NOTE — Progress Notes (Addendum)
HD#11 SUBJECTIVE:  Patient Summary: [name] is a 75 y.o. male (he/him/his) with a pertinent PMH of persistent atrial fibrillation (on Eliquis), complete heart block s/p PPM, grade II HFpEF, hypertension, hypothyroidism, diabetes mellitus, CKD IIIb, who presents to [facility] with altered mental status.   Overnight Events: Continues to refuse med x4 days.  Interim History: [name] was evaluated this AM at bedside. A&Ox1, to self only. AMS has improved, as he is more responsive and able to follow commands.  He is more communicative, however will relapse intermittently into repeating "yes" as he did the past few days. He still lays on his right side, in a fetal position, and appears restless.   OBJECTIVE:  Vital Signs: Vitals:   11/01/20 1359 11/01/20 2041 11/02/20 0213 11/02/20 0830  BP: (!) 168/57 135/87  (!) 159/62  Pulse: 70 69  64  Resp: 18     Temp: 98 F (36.7 C)   97.8 F (36.6 C)  TempSrc: Oral     SpO2:  92%  93%  Weight:   102 kg     Filed Weights   10/31/20 0500 11/01/20 0500 11/02/20 0213  Weight: 107.6 kg 104.3 kg 102 kg   No intake or output data in the 24 hours ending 11/02/20 1411  Net IO Since Admission: -1,060.52 mL [11/02/20 1411]  Physical Exam:  Physical Exam Vitals and nursing note reviewed.  Constitutional:      General: He is in acute distress.     Appearance: He is ill-appearing. He is not toxic-appearing or diaphoretic.     Comments: Roled onto R-side in fetal position, averbal, will not follow commands, see above for additional details  HENT:     Head: Normocephalic and atraumatic.     Mouth/Throat:     Mouth: Mucous membranes are dry.  Eyes:     Extraocular Movements: Extraocular movements intact.     Conjunctiva/sclera: Conjunctivae normal.  Cardiovascular:     Rate and Rhythm: Normal rate and regular rhythm.     Heart sounds: Murmur (Diastolic murmur) heard.    No gallop.  Pulmonary:     Breath sounds: No wheezing or rales.      Comments: Increased respiratory effort Abdominal:     General: Bowel sounds are normal. There is no distension.     Palpations: Abdomen is soft.  Musculoskeletal:     Cervical back: Normal range of motion.     Right lower leg: No edema.     Left lower leg: No edema.     Comments: Palpable DP pulses. Left lower extremity wrapped with wound-vac in place.  Skin:    General: Skin is warm and dry.     Findings: No rash.  Neurological:     Mental Status: He is alert. He is disoriented.     Comments: Not a baseline     ASSESSMENT/PLAN:  Assessment: Active Problems:   AMS (altered mental status)   Ulcer of left lower extremity with muscle involvement without evidence of necrosis (HCC)  Plan: #Acute Encephalopathy #Hyperactive Delirium Presented with AMS after being found in his car for >10 hours.  Mental status has improved after bowel movement, although not at baseline.  Patient no longer refusing food and meds.  Will have speech therapy evaluate patient. - Continue delirium precautions, limiting tubes and lines. -Follow-up speech therapy  #HFpEF (Grade II DD) Right pleural effusion evident on CXR. Likely 2/2 to refusing meds.  - Holding home Lasix 20mg  PO, in setting  of AKI - Strict I&O, daily weights  # LLE Necrotic Ulcer s/p debridement with multiple compartment releases 10/28/2020, POD 5 Secondary to mechanical fall 9/16 and poor wound healing in the setting of diabetes and vascular disease, which resulted in non-healing ulcer with necrosis and gangrene. S/p debridement 10/5 with multiple compartment release. Afebrile and no leukocytosis, we will continue to monitor.  - Orthopedics following; appreciate their recommendations  - Wound vac for 1 week after discharge (start 10/5) - Weightbearing as tolerated on the left - PT/OT with weight bearing as tolerated; will need SNF placement   #Likely Dementia Memory deficits were made obvious during hospitalization. - Will obtain MMSE,  once at baseline mentation  # Acute on chronic macrocytic anemia # Anemia of Chronic Disease Downtrend in hemoglobin over the last 3 months with no clear source. Despite macrocytic nature, folate and B12 are WNL. Reticulocyte index suggests hypo-proliferation. Hb stable, we will continue to monitor. - Trend hemoglobin  - Transfuse as needed for hemoglobin < 7 - We will have patient follow up outpatient   # Chronic Urinary Incontinence # Hx of BPH Difficult to quantify urinary output, as patient he is removing his condom cath.  - Bladder scans Q8h  - Continue tamsulosin 0.8 mg daily - Follow-up with urology outpatient  # Recurrent Falls  # History of Peripheral Neuropathy  - DC gabapentin 300 mg twice daily for delirium precautions - SNF placement   #AoCKDIII Baseline Cr 1.4-1.5. Likely from recent IV Lasix. Cr downtrending, but still elevated.  We will hold home Lasix for now. - Avoid nephrotoxic agents   #Type II Diabetes Mellitus HbA1c 7.8%. - Levemir 5U + SSI - CBG monitoring  - DC Gabapentin 300 mg twice daily, for above - Will have patient follow-up outpatient for more optimal medical management and encourage lifestyle modification   #Persistent atrial fibrillation on Eliquis #Hx of complete heart block s/p PPM CHA2DS2VASc Score - 5 HASBLED Score- 2.  Restarting home Eliquis, as hematoma has been evacuated by ortho, and that Hb has been stable. - Continue Amiodarone - Continue home Eliquis 5mg  BID   # HTN  - Continue home Hydralazine 25mg  q8h   # Hypothyroidism - Continue Synthroid 175 mcg  Medical history making Of note, per patient's brother [name]), patient has a son whom he has not communicated with for the last 5 to 6 years.  Also states that the patient was not part of his son's upbringing.  Because of patient's memory deficit and mental status, this leaves [name] as the only other next of kin, for medical decision making.  Best Practice: Diet: Dys 3,  mechanical soft IVF: Fluids: none VTE: SCDs Start: 10/29/20 1124 Code: Full Therapy Recs: SNF, and DME 3 in 1 bedside commode  DISPO: Anticipated discharge in 1-2 days days to Skilled nursing facility pending  clinical improvement .  Signature: Merrily Brittle, DO Psychiatry Resident, PGY-1 [facility] Teaching Service Pager: [phone] After 5pm on weekdays and 1pm on weekends: On Call pager [phone]  11/02/2020, 2:11 PM

## 2020-11-02 NOTE — TOC Progression Note (Signed)
Transition of Care [facility]) - Progression Note    Patient Details  Name: [name] MRN: [id number] Date of Birth: 02-Mar-1945  Transition of Care [facility]) CM/SW Contact  Pollie Friar, RN Phone Number: 11/02/2020, 2:10 PM  Clinical Narrative:    Attempted to reach brother for SNF rehab choice. Unable to leave a message or secure text.  TOC following.   Expected Discharge Plan: [place] Barriers to Discharge: Continued Medical Work up  Expected Discharge Plan and Services Expected Discharge Plan: [place] In-house Referral: NA Discharge Planning Services: CM Consult Post Acute Care Choice: [place] Living arrangements for the past 2 months: Single Family Home                 DME Arranged: N/A DME Agency: NA       HH Arranged: NA HH Agency: NA         Social Determinants of Health (SDOH) Interventions    Readmission Risk Interventions Readmission Risk Prevention Plan 10/26/2020  Transportation Screening Complete  PCP or Specialist Appt within 3-5 Days Complete  HRI or Home Care Consult Complete  Social Work Consult for [place] Planning/Counseling Complete  Palliative Care Screening Not Applicable  Medication Review [employer] photographer) Referral to Pharmacy  Some recent data might be hidden

## 2020-11-02 NOTE — Plan of Care (Signed)

## 2020-11-03 DIAGNOSIS — E43 Unspecified severe protein-calorie malnutrition: Secondary | ICD-10-CM | POA: Insufficient documentation

## 2020-11-03 DIAGNOSIS — G9340 Encephalopathy, unspecified: Secondary | ICD-10-CM

## 2020-11-03 LAB — BASIC METABOLIC PANEL
Anion gap: 7 (ref 5–15)
BUN: 26 mg/dL — ABNORMAL HIGH (ref 8–23)
CO2: 27 mmol/L (ref 22–32)
Calcium: 8.7 mg/dL — ABNORMAL LOW (ref 8.9–10.3)
Chloride: 106 mmol/L (ref 98–111)
Creatinine, Ser: 1.9 mg/dL — ABNORMAL HIGH (ref 0.61–1.24)
GFR, Estimated: 36 mL/min — ABNORMAL LOW (ref >60)
Glucose, Bld: 117 mg/dL — ABNORMAL HIGH (ref 70–99)
Potassium: 3.4 mmol/L — ABNORMAL LOW (ref 3.5–5.1)
Sodium: 140 mmol/L (ref 135–145)

## 2020-11-03 LAB — GLUCOSE, CAPILLARY
Glucose-Capillary: 65 mg/dL — ABNORMAL LOW (ref 70–99)
Glucose-Capillary: 91 mg/dL (ref 70–99)
Glucose-Capillary: 219 mg/dL — ABNORMAL HIGH (ref 70–99)
Glucose-Capillary: 190 mg/dL — ABNORMAL HIGH (ref 70–99)
Glucose-Capillary: 86 mg/dL (ref 70–99)

## 2020-11-03 LAB — MAGNESIUM: Magnesium: 2.1 mg/dL (ref 1.7–2.4)

## 2020-11-03 MED ORDER — SENNOSIDES-DOCUSATE SODIUM 8.6-50 MG PO TABS
1.0000 | ORAL_TABLET | Freq: Every day | ORAL | Status: DC
  Administered 2020-11-03 – 2020-11-08 (×6): 1 via ORAL
  Filled 2020-11-03 (×6): qty 1

## 2020-11-03 MED ORDER — POTASSIUM CHLORIDE CRYS ER 20 MEQ PO TBCR
40.0000 meq | EXTENDED_RELEASE_TABLET | Freq: Once | ORAL | Status: AC
  Administered 2020-11-03: 40 meq via ORAL
  Filled 2020-11-03: qty 2

## 2020-11-03 MED ORDER — PROSOURCE PLUS PO LIQD
30.0000 mL | Freq: Two times a day (BID) | ORAL | Status: DC
  Administered 2020-11-03 – 2020-11-10 (×14): 30 mL via ORAL
  Filled 2020-11-03 (×14): qty 30

## 2020-11-03 MED ORDER — POLYETHYLENE GLYCOL 3350 17 G PO PACK
17.0000 g | PACK | Freq: Every day | ORAL | Status: DC
  Administered 2020-11-03 – 2020-11-09 (×7): 17 g via ORAL
  Filled 2020-11-03 (×7): qty 1

## 2020-11-03 MED ORDER — ENSURE ENLIVE PO LIQD
237.0000 mL | Freq: Two times a day (BID) | ORAL | Status: DC
  Administered 2020-11-03 – 2020-11-09 (×12): 237 mL via ORAL

## 2020-11-03 NOTE — Evaluation (Signed)
Clinical/Bedside Swallow Evaluation Patient Details  Name: [name] MRN: [id number] Date of Birth: [birth year]  Today's Date: 11/03/2020 Time: SLP Start Time (ACUTE ONLY): 0910 SLP Stop Time (ACUTE ONLY): 0925 SLP Time Calculation (min) (ACUTE ONLY): 15 min  Past Medical History:  Past Medical History:  Diagnosis Date   Arthritis    Atrial fibrillation (HCC)    BPH (benign prostatic hypertrophy) 04/15/2010   Cataract    Colon polyps    DEPRESSION 01/22/2009   Heart valve replaced    HYPERCHOLESTEROLEMIA 01/22/2009   HYPERTENSION 01/22/2009   Dr. 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/22/2009   IDDM 01/22/2009   Morbid obesity ([place])    Severe nonproliferative diabetic retinopathy of both eyes ([place]) 08/27/2019   Stage 3b chronic kidney disease ([place]) 09/18/2019   Tremors of nervous system    ?ptsd   Ulcer    Vitreous hemorrhage of left eye ([place]) 08/27/2019   Past Surgical History:  Past Surgical History:  Procedure Laterality Date   AORTIC VALVE REPLACEMENT  July 2006   #20 stentless [place] porcine valve   APPENDECTOMY     bilateral cataract surg     BIOPSY  05/01/2020   Procedure: BIOPSY;  Surgeon: Eloise Harman, DO;  [facility];  Service: Endoscopy;;   COLONOSCOPY  04/24/2007   [name]: normal   COLONOSCOPY WITH ESOPHAGOGASTRODUODENOSCOPY (EGD) N/A 04/05/2012   normal rectum, somewhat elongated and redundant colon, otherwise normal. Remote history of colon polyps. EGD: normal esophagus, bile-stained gastric mucosa, diffuse patchy gastric erythema, small duodenal bulbar ulcer with associated erosions. Negative H.pylori. H.pylori serology also negative.    COLONOSCOPY WITH PROPOFOL N/A 05/01/2020   Procedure: COLONOSCOPY WITH PROPOFOL;  Surgeon: Eloise Harman, DO;  [facility];  Service: Endoscopy;  Laterality: N/A;   DENTAL SURGERY  05/2004   Dental extractions   ESOPHAGOGASTRODUODENOSCOPY (EGD) WITH PROPOFOL N/A 05/01/2020   Procedure:  ESOPHAGOGASTRODUODENOSCOPY (EGD) WITH PROPOFOL;  Surgeon: Eloise Harman, DO;  [facility];  Service: Endoscopy;  Laterality: N/A;   EYE SURGERY Bilateral    cataract   HEEL SPUR SURGERY Bilateral    resection of heel spur   I & D EXTREMITY Left 10/28/2020   Procedure: LEFT LEG DEBRIDEMENT;  Surgeon: [name], MD;  Location: [facility];  Service: Orthopedics;  Laterality: Left;   KNEE ARTHROSCOPY WITH LATERAL MENISECTOMY Right 04/04/2014   Procedure: KNEE ARTHROSCOPY WITH LATERAL MENISECTOMY;  Surgeon: Carole Civil, MD;  Location: [facility];  Service: Orthopedics;  Laterality: Right;   PACEMAKER IMPLANT N/A 06/13/2016   Procedure: Pacemaker Implant- Dual Chamber;  Surgeon: Evans Lance, MD;  Location: [facility];  Service: Cardiovascular;  Laterality: N/A;   PACEMAKER INSERTION  2018   THYROIDECTOMY  03/21/2013   DR [name]   THYROIDECTOMY N/A 03/21/2013   Procedure: THYROIDECTOMY;  Surgeon: [name], MD;  Location: [facility];  Service: General;  Laterality: N/A;   TONSILLECTOMY     HPI:  Pt is a 75 y.o. male admitted on 10/22/20 due to AMS. On 9/30 pt had an I&D of LLE abcess. Dx Acute encephalopathy secondary to hypothyroidism; unintenstional noncompliance with Synthyroid suspected since pt was taking it with food. Past medical history of A. 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    Assessment / Plan / Recommendation  Clinical Impression  SLP reassessment reordered as pt willingness  to eat and drink improved. Pt seen sitting upright self feeding am mech soft meal. Pt observed to have prolonged mastication of solids due to missing dentition. Pt does not have dentures says he wishes I would get him some. When pt uses a liquid wash to clear solids this results in wet vocal quality and one cough during meal. Pt then observed to use grits to  make a more cohesive bolus with solids with much greater success. When drinking thins in isolation there were no observations of cough or wet vocal quality. Recommend trianing to avoid mixing thin and solid consistencied to reduce coughing episodes and using puree textures mixed with masticated solids as needed. Pts mentation is improving but he is not capable of retaining new learning at this time. Posted sign for staff and will f/u for repeated teaching. SLP Visit Diagnosis: Dysphagia, oral phase (R13.11)    Aspiration Risk  Mild aspiration risk    Diet Recommendation Thin liquid;Dysphagia 3 (Mech soft)   Liquid Administration via: Cup;Straw Medication Administration: Whole meds with liquid Supervision: Patient able to self feed;Intermittent supervision to cue for compensatory strategies Compensations: Slow rate;Small sips/bites;Other (Comment) (avoid mixing solids and liquids. clear mouth with puree) Postural Changes: Seated upright at 90 degrees;Remain upright for at least 30 minutes after po intake    Other  Recommendations Oral Care Recommendations: Oral care BID    Recommendations for follow up therapy are one component of a multi-disciplinary discharge planning process, led by the attending physician.  Recommendations may be updated based on patient status, additional functional criteria and insurance authorization.  Follow up Recommendations Skilled Nursing facility      Frequency and Duration min 2x/week  2 weeks       Prognosis Prognosis for Safe Diet Advancement: Good Barriers to Reach Goals: Cognitive deficits      Swallow Study   General HPI: Pt is a 75 y.o. male admitted on 10/22/20 due to AMS. On 9/30 pt had an I&D of LLE abcess. Dx Acute encephalopathy secondary to hypothyroidism; unintenstional noncompliance with Synthyroid suspected since pt was taking it with food. Past medical history of A. 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 Type of Study: Bedside Swallow Evaluation Previous Swallow Assessment: 10/25/20 Diet Prior to this Study: Dysphagia 3 (soft);Thin liquids Temperature Spikes Noted: No Respiratory Status: Room air History of Recent Intubation: No Behavior/Cognition: Alert;Cooperative;Pleasant mood Oral Cavity Assessment: Within Functional Limits Oral Care Completed by SLP: No Oral Cavity - Dentition: Adequate natural dentition Vision: Functional for self-feeding Self-Feeding Abilities: Able to feed self Patient Positioning: Upright in bed Baseline Vocal Quality: Wet Volitional Cough: Strong Volitional Swallow: Able to elicit    Oral/Motor/Sensory Function Overall Oral Motor/Sensory Function: Within functional limits   Ice Chips     Thin Liquid Thin Liquid: Impaired Presentation: Cup;Self Fed;Straw Pharyngeal  Phase Impairments: Wet Vocal Quality    Nectar Thick Nectar Thick Liquid: Not tested   Honey Thick Honey Thick Liquid: Not tested   Puree Puree: Within functional limits   Solid     Solid: Impaired Presentation: Self Fed Oral Phase Impairments: Impaired mastication Oral Phase Functional Implications: Impaired mastication;Prolonged oral transit     Herbie Baltimore, MA CCC-SLP  Acute Rehabilitation Services Pager [phone] Office [phone]  Lynann Beaver 11/03/2020,11:08 AM

## 2020-11-03 NOTE — Progress Notes (Signed)
Initial Nutrition Assessment  DOCUMENTATION CODES:  Severe malnutrition in context of chronic illness  INTERVENTION:  -Ensure Enlive po BID, each supplement provides 350 kcal and 20 grams of protein -PROSource PLUS PO 36mls BID, each supplement provides 100 kcals and 15 grams of protein -MVI with minerals daily  NUTRITION DIAGNOSIS:  Severe Malnutrition related to chronic illness (HF and likely dementia) as evidenced by severe fat depletion, severe muscle depletion, percent weight loss.  GOAL:  Patient will meet greater than or equal to 90% of their needs  MONITOR:  PO intake, Supplement acceptance, Diet advancement, Labs, Weight trends, I & O's, Skin  REASON FOR ASSESSMENT:  Consult Assessment of nutrition requirement/status, Poor PO  ASSESSMENT:  Pt with a PMH significant for persistent Afib, complete heart block s/p PPM, grade II HFpEF, HTN, hypothyroidism, DM, CKD IIIb admitted with acute encephalopathy and hyperactive delirium. Pt also with LLE necrotic ulcer s/p debridement 10/5 w/ multiple compartment releases.  Pt's mental status has improved, though is still A&O to self only and not yet returned to baseline. Pt provided no meaningful responses to RD questions at this time. Pt had been refusing POs 2/2 constipation, but per RN, pt has started to accept foods and meds now that he has had a BM. SLP to evaluate pt today. Note MD plan to obtain MMSE once pt at baseline mentation due to suspected dementia.   Reviewed weight history. Pt weighed 107 kg on 07/23/20 and 97.7 kg upon admission. This indicates a 8.7% weight loss over ~4 months, which is significant for time frame.  PO Intake: 70-100% x5 recorded meals (94% average meal intake)  Medications: SSI TID w/ meals, 5 units Levemir daily, MVI w/ minerals Labs: K+ 3.4 (L), BUN 26 (H), Cr 1.90 (H) CBGs: 65-145 x24 hours  UOP: 1 unmeasured occurrence x24 hours I/O: -860ml since admit  NUTRITION - FOCUSED PHYSICAL  EXAM: Flowsheet Row Most Recent Value  Orbital Region Severe depletion  Upper Arm Region Severe depletion  Thoracic and Lumbar Region Severe depletion  Buccal Region Severe depletion  Temple Region Moderate depletion  Clavicle Bone Region Severe depletion  Clavicle and Acromion Bone Region Severe depletion  Scapular Bone Region Severe depletion  Dorsal Hand Severe depletion  Patellar Region Severe depletion  Anterior Thigh Region Severe depletion  Posterior Calf Region Severe depletion  Edema (RD Assessment) None  Hair Reviewed  Eyes Reviewed  Mouth Reviewed  Skin Reviewed  Nails Reviewed      Diet Order:   Diet Order             DIET DYS 3 Room service appropriate? Yes; Fluid consistency: Thin  Diet effective now                  EDUCATION NEEDS:  Not appropriate for education at this time  Skin:  Skin Assessment: Skin Integrity Issues: Skin Integrity Issues:: Wound VAC, Incisions Wound Vac: Pretibial L Incisions: L leg  Last BM:  10/10  Height:  Ht Readings from Last 1 Encounters:  10/10/20 6\' 1"  (1.854 m)   Weight:  Wt Readings from Last 1 Encounters:  11/03/20 97.7 kg   BMI:  Body mass index is 28.42 kg/m.  Estimated Nutritional Needs:  Kcal:  2400-2600 Protein:  120-130 grams Fluid:  >2L    Larkin Ina, MS, RD, LDN (she/her/hers) RD pager number and weekend/on-call pager number located in [place].

## 2020-11-03 NOTE — Progress Notes (Addendum)
HD#12 SUBJECTIVE:  Patient Summary: [name] is a 75 y.o. male (he/him/his) with a pertinent PMH of persistent atrial fibrillation (on Eliquis), complete heart block s/p PPM, grade II HFpEF, hypertension, hypothyroidism, diabetes mellitus, CKD IIIb, who presents to [facility] with altered mental status.   Overnight Events: Accepted scheduled meds  Interim History: [name] endorses some "funny breathing" but cannot characterize it. States that he did not sleep well, and requests a sleeping pill. He is alert and sitting up in bed. He is oriented to person, place superficially. Knows that he is in the hospital, but thought he was in [place] or [place], [place]. He does not recall what brought him in to the hospital or about the LLE wound. He denies SOB, CP, pain at surgical site.   OBJECTIVE:  Vital Signs: Vitals:   11/03/20 0200 11/03/20 0425 11/03/20 0831 11/03/20 1236  BP:  (!) 130/48 129/75 (!) 132/52  Pulse:  66 60 60  Resp:  20 16   Temp:  97.9 F (36.6 C) (!) 97 F (36.1 C) (!) 97.2 F (36.2 C)  TempSrc:  Oral  Oral  SpO2:  97% 98% 96%  Weight: 97.7 kg       Filed Weights   11/01/20 0500 11/02/20 0213 11/03/20 0200  Weight: 104.3 kg 102 kg 97.7 kg    Intake/Output Summary (Last 24 hours) at 11/03/2020 1655 Last data filed at 11/03/2020 0959 Gross per 24 hour  Intake 363 ml  Output 200 ml  Net 163 ml   Net IO Since Admission: -897.52 mL [11/03/20 1655]  Physical Exam:  Physical Exam Vitals and nursing note reviewed.  Constitutional:      General: He is not in acute distress.    Appearance: He is ill-appearing. He is not toxic-appearing or diaphoretic.     Comments: A&Ox1, person only. States that he is in the hospital, in [place] or [place], [place]. See above for more details  HENT:     Head: Normocephalic and atraumatic.  Eyes:     Extraocular Movements: Extraocular movements intact.     Conjunctiva/sclera: Conjunctivae normal.  Cardiovascular:     Rate and  Rhythm: Normal rate and regular rhythm.     Pulses:          Dorsalis pedis pulses are 2+ on the right side and 2+ on the left side.     Heart sounds: Murmur (Systolic on LUSB) heard.    No gallop.  Pulmonary:     Effort: Pulmonary effort is normal.     Breath sounds: Decreased air movement present. Examination of the right-middle field reveals decreased breath sounds. Examination of the right-lower field reveals decreased breath sounds. Decreased breath sounds present. No wheezing, rhonchi or rales.  Musculoskeletal:     Right lower leg: No edema.     Left lower leg: No edema.     Comments: Palpable DP pulses. Left lower extremity wrapped with wound-vac in place.  Skin:    General: Skin is warm and dry.  Neurological:     Mental Status: He is alert. He is disoriented.  Psychiatric:     Comments: Affect and mood, concerned, confused, but cooperative and pleasant on encounter Speech clear, coherent, goal directed, spontaneous.     ASSESSMENT/PLAN:  Assessment: Principal Problem:   Acute encephalopathy, likely multifactorial.  Initial episode resolved, now with new delirium improving. Active Problems:   Hospital acquired/post-op delirium, improved with assisted bowel movement   LLE surgical wound following debridement of extensive  hematoma complicating chronic traumatic wound   Benign prostatic hyperplasia   Type 2 diabetes mellitus with diabetic neuropathy, unspecified (HCC)   Hypothyroidism   Urgency incontinence   Protein-calorie malnutrition, severe   Acute on chronic kidney injury, not otherwise specified  Plan: #Acute Encephalopathy, resolved  #Hypoactive Delirium Presented with AMS after being found in his car for >10 hours. A&Ox1, nearing his baseline mental status, see above for details. SLP evaluated patient, and recommended Dysph 3 (mech soft) diet.  - Continue delirium precautions, limiting tubes and lines. IMprovement daily in the past 72 hrs following BM.  He was  sitting in chair today, more conversant, has worked with therapy.    #HFpEF (Grade II DD) Right pleural effusion evident on CXR (resolved).  - Holding home Lasix 20mg  PO, in setting of AKI, oliguria, and decreased p.o. intake - Strict I&O, daily weights, Mg2+.  Output not measurable due to incontinence.  WEights are declining.  # LLE Necrotic Ulcer s/p debridement with multiple compartment releases 10/28/2020, 6 Days Post-Op  Secondary to mechanical fall 9/16, poor wound healing in the setting of diabetes and vascular disease, which resulted in non-healing ulcer with necrosis and gangrene. S/p debridement 10/5 with multiple compartment release.  Antibiotics completed on 10/7.  We will continue to monitor.  - Orthopedics following; appreciate their recommendations  - Wound vac for 1 week after discharge (start 10/5) - Weightbearing as tolerated on the left - PT/OT with weight bearing as tolerated; will need SNF placement   #Likely Dementia Short term memory deficits were made obvious during hospitalization.  - Will obtain MMSE, once at baseline mental status  # Acute on chronic macrocytic anemia # Anemia of Chronic Disease Downtrend in hemoglobin over the last 3 months with no clear source. Despite macrocytic nature, folate and B12 are WNL. Reticulocyte index suggests hypo-proliferation. Hb stable, we will continue to monitor. - Trend hemoglobin  - Transfuse as needed for hemoglobin < 7 - We will have patient follow up outpatient  # Chronic Urinary Incontinence # Hx of BPH Difficult to quantify urinary output, as patient he is removing his condom cath.  - Bladder scans Q8h  - Continue tamsulosin 0.8 mg daily - Follow-up with urology outpatient  # Recurrent Falls   # History of Peripheral Neuropathy  - stopped gabapentin 300 mg twice daily for delirium precautions.  He has not endorsed symptoms. - SNF placement   #AoCKDIII Baseline Cr 1.4-1.5. Likely from recent IV Lasix. Cr  downtrending, but still elevated.  We will hold home Lasix for now. - Avoid nephrotoxic agents   #Type II Diabetes Mellitus HbA1c 7.8%. - Levemir 5U + SSI - CBG monitoring  - DC Gabapentin 300 mg twice daily, for reasons above - Will have patient follow-up outpatient for more optimal medical management and encourage lifestyle modification   #Persistent atrial fibrillation on Eliquis #Hx of complete heart block s/p PPM CHA2DS2VASc Score - 5 HASBLED Score- 2.  Restarting home Eliquis, as hematoma has been evacuated by ortho, and that Hb has been stable. - Continue Amiodarone - Continue home Eliquis 5mg  BID   # HTN  - Continue home Hydralazine 25mg  q8h   # Hypothyroidism - Continue Synthroid 175 mcg  Medical history making Of note, per patient's brother [name]), patient has a son whom he has not communicated with for the last 5 to 6 years.  Also states that the patient was not part of his son's upbringing.  Because of patient's memory deficit and mental status,  this leaves [name] as the only other next of kin, for medical decision making.  Best Practice: Diet: Dys 3, mechanical soft Pain: Acetaminophen 1000mg  TID [facility] IVF: Fluids: none VTE: SCDs Start: 10/29/20 1124 Code: Full Therapy Recs: SNF, and DME 3 in 1 bedside commode  DISPO: Anticipated discharge in 1-2 days days to Skilled nursing facility pending Placement  Signature: Merrily Brittle, DO Psychiatry Resident, PGY-1 [facility] Teaching Service Pager: [phone] After 5pm on weekdays and 1pm on weekends: On Call pager [phone]  11/03/2020, 4:55 PM

## 2020-11-03 NOTE — TOC Progression Note (Signed)
Transition of Care [facility]) - Progression Note    Patient Details  Name: [name] MRN: [id number] Date of Birth: 1946/01/03  Transition of Care [facility]) CM/SW [place], RN Phone Number:[phone]  11/03/2020, 3:23 PM  Clinical Narrative:    CM met with brother at bedside. CM gave brother a list of facilities that have made a bed offer. Brother requested that CM call [facility] [place] and [facility] rehab in [place]. CM has left voicemail for [facility] at [place]. CM spoke with Bryson Ha at [place] rehab. Bryson Ha is reviewing patients info and will get back with CM.    Expected Discharge Plan: [place] Barriers to Discharge: Continued Medical Work up  Expected Discharge Plan and Services Expected Discharge Plan: [place] In-house Referral: NA Discharge Planning Services: CM Consult Post Acute Care Choice: [place] Living arrangements for the past 2 months: Single Family Home                 DME Arranged: N/A DME Agency: NA       HH Arranged: NA HH Agency: NA         Social Determinants of Health (SDOH) Interventions    Readmission Risk Interventions Readmission Risk Prevention Plan 10/26/2020  Transportation Screening Complete  PCP or Specialist Appt within 3-5 Days Complete  HRI or Home Care Consult Complete  Social Work Consult for [place] Planning/Counseling Complete  Palliative Care Screening Not Applicable  Medication Review [employer]) Referral to Pharmacy  Some recent data might be hidden

## 2020-11-03 NOTE — Progress Notes (Addendum)
HD#12 SUBJECTIVE:  Patient Summary: [name] is a 75 y.o. male (he/him/his) with a pertinent PMH of persistent atrial fibrillation (on Eliquis), complete heart block s/p PPM, grade II HFpEF, hypertension, hypothyroidism, diabetes mellitus, CKD IIIb, who presents to [facility] with altered mental status.   Overnight Events: Accepted scheduled meds  Interim History: [name] endorses some "funny breathing" but cannot characterize it. States that he did not sleep well, and requests a sleeping pill. He is alert and sitting up in bed. He is oriented to person, place superficially. Knows that he is in the hospital, but thought he was in [place] or [place], [place].  He does not recall what brought him in to the hospital or about the LLE wound.  He denies SOB, CP, pain at surgical site.    OBJECTIVE:  Vital Signs: Vitals:   11/03/20 0200 11/03/20 0425 11/03/20 0831 11/03/20 1236  BP:  (!) 130/48 129/75 (!) 132/52  Pulse:  66 60 60  Resp:  20 16   Temp:  97.9 F (36.6 C) (!) 97 F (36.1 C) (!) 97.2 F (36.2 C)  TempSrc:  Oral  Oral  SpO2:  97% 98% 96%  Weight: 97.7 kg       Filed Weights   11/01/20 0500 11/02/20 0213 11/03/20 0200  Weight: 104.3 kg 102 kg 97.7 kg    Intake/Output Summary (Last 24 hours) at 11/03/2020 1704 Last data filed at 11/03/2020 0959 Gross per 24 hour  Intake 363 ml  Output 200 ml  Net 163 ml   Net IO Since Admission: -897.52 mL [11/03/20 1704]  Physical Exam:  Physical Exam Vitals and nursing note reviewed.  Constitutional:      General: He is not in acute distress.    Appearance: He is ill-appearing. He is not toxic-appearing or diaphoretic.     Comments: A&Ox1, person only. States that he is in the hospital, in [place] or [place], [place]. See above for more details  HENT:     Head: Normocephalic and atraumatic.  Eyes:     Extraocular Movements: Extraocular movements intact.     Conjunctiva/sclera: Conjunctivae normal.  Cardiovascular:      Rate and Rhythm: Normal rate and regular rhythm.     Pulses:          Dorsalis pedis pulses are 2+ on the right side and 2+ on the left side.     Heart sounds: Murmur (Systolic on LUSB) heard.    No gallop.  Pulmonary:     Effort: Pulmonary effort is normal.     Breath sounds: Decreased air movement present. Examination of the right-middle field reveals decreased breath sounds. Examination of the right-lower field reveals decreased breath sounds. Decreased breath sounds present. No wheezing, rhonchi or rales.  Musculoskeletal:     Right lower leg: No edema.     Left lower leg: No edema.     Comments: Palpable DP pulses. Left lower extremity wrapped with wound-vac in place.  Skin:    General: Skin is warm and dry.  Neurological:     Mental Status: He is alert. He is disoriented.  Psychiatric:     Comments: Affect and mood, concerned, confused, but cooperative and pleasant on encounter Speech clear, coherent, goal directed, spontaneous.     ASSESSMENT/PLAN:  Assessment: Principal Problem:   Acute encephalopathy Active Problems:   Benign prostatic hyperplasia   Type 2 diabetes mellitus with diabetic neuropathy, unspecified ([place])   Hypothyroidism   Urgency incontinence   Protein-calorie malnutrition,  severe  Plan: #Acute Encephalopathy, resolved #Hypoactive Delirium Presented with AMS after being found in his car for >10 hours. A&Ox1, nearing his baseline mental status, see above for details. SLP evaluated patient, and recommended Dysph 3 (mech soft) diet.  - Continue delirium precautions, limiting tubes and lines. - Start scheduled bowel regimen  #HFpEF (Grade II DD) Right pleural effusion evident on CXR. Likely 2/2 to refusing meds.  - Holding home Lasix 20mg  PO, in setting of AKI, oliguria, and decreased p.o. intake - Strict I&O, daily weights, Mg2+  #Protein-calorie malnutrition, severe Consulted dietitian, appreciate assistance and recommendations. - Diet and  supplements Per dietitian  # LLE Necrotic Ulcer s/p debridement with multiple compartment releases 10/28/2020, 6 Days Post-Op  Secondary to mechanical fall 9/16, poor wound healing in the setting of diabetes and vascular disease, which resulted in non-healing ulcer with necrosis and gangrene. S/p debridement 10/5 with multiple compartment release.  Antibiotics completed on 10/7.  We will continue to monitor.  - Orthopedics following; appreciate their recommendations  - Wound vac for 1 week after discharge (start 10/5) - Weightbearing as tolerated on the left - PT/OT with weight bearing as tolerated; will need SNF placement   #Likely Dementia Short term memory deficits were made obvious during hospitalization.  - Will obtain MMSE, once at baseline mental status  # Acute on chronic macrocytic anemia # Anemia of Chronic Disease Downtrend in hemoglobin over the last 3 months with no clear source. Despite macrocytic nature, folate and B12 are WNL. Reticulocyte index suggests hypo-proliferation. Hb stable, we will continue to monitor. - Trend hemoglobin  - Transfuse as needed for hemoglobin < 7 - We will have patient follow up outpatient  # Chronic Urinary Incontinence # Hx of BPH Difficult to quantify urinary output, as patient he is removing his condom cath.  - Bladder scans Q8h  - Continue tamsulosin 0.8 mg daily - Follow-up with urology outpatient  # Recurrent Falls  # History of Peripheral Neuropathy  - DC gabapentin 300 mg twice daily for delirium precautions - SNF placement   #AoCKDIII Baseline Cr 1.4-1.5. Likely from recent IV Lasix. Cr downtrending, but still elevated.  We will hold home Lasix for now. - Avoid nephrotoxic agents   #Type II Diabetes Mellitus HbA1c 7.8%. - Levemir 5U + SSI - CBG monitoring  - DC Gabapentin 300 mg twice daily, for reasons above - Will have patient follow-up outpatient for more optimal medical management and encourage lifestyle modification    #Persistent atrial fibrillation on Eliquis #Hx of complete heart block s/p PPM CHA2DS2VASc Score - 5 HASBLED Score- 2.  Restarting home Eliquis, as hematoma has been evacuated by ortho, and that Hb has been stable. - Continue Amiodarone - Continue home Eliquis 5mg  BID   # HTN  - Continue home Hydralazine 25mg  q8h   Hypothyroidism - Continue Synthroid 175 mcg  Medical history making Of note, per patient's brother [name]), patient has a son whom he has not communicated with for the last 5 to 6 years.  Also states that the patient was not part of his son's upbringing.  Because of patient's memory deficit and mental status, this leaves [name] as the only other next of kin, for medical decision making.  Best Practice: Diet: Dys 3, mechanical soft Pain: Acetaminophen 1000mg  TID [facility] BM: MiraLAX and Senokot scheduled IVF: Fluids: none VTE: SCDs Start: 10/29/20 1124 Code: Full Therapy Recs: SNF, and DME 3 in 1 bedside commode  DISPO: Anticipated discharge in 1-2 days days to  Skilled nursing facility pending Placement  Signature: Merrily Brittle, DO Psychiatry Resident, PGY-1 [facility] Teaching Service Pager: [phone] After 5pm on weekdays and 1pm on weekends: On Call pager [phone]  11/03/2020, 5:04 PM

## 2020-11-03 NOTE — Progress Notes (Signed)
ANTICOAGULATION CONSULT NOTE - Initial Consult  Pharmacy Consult for apixaban Indication: atrial fibrillation  Allergies  Allergen Reactions   Phenothiazines Anaphylaxis   Actos [Pioglitazone] Swelling    Patient Measurements: Weight: 97.7 kg (215 lb 6.2 oz) (wound vac machine taken off bed prior weight)  Vital Signs: Temp: 97 F (36.1 C) (10/11 0831) Temp Source: Oral (10/11 0425) BP: 129/75 (10/11 0831) Pulse Rate: 60 (10/11 0831)  Labs: Recent Labs    11/01/20 0213 11/02/20 0241 11/03/20 0232  HGB 9.0* 8.8*  --   HCT 28.1* 28.0*  --   PLT 197 239  --   CREATININE 2.20* 2.18* 1.90*    Estimated Creatinine Clearance: 40.1 mL/min (A) (by C-G formula based on SCr of 1.9 mg/dL (H)).   Medical History: Past Medical History:  Diagnosis Date   Arthritis    Atrial fibrillation (HCC)    BPH (benign prostatic hypertrophy) 04/15/2010   Cataract    Colon polyps    DEPRESSION 01/22/2009   Heart valve replaced    HYPERCHOLESTEROLEMIA 01/22/2009   HYPERTENSION 01/22/2009   Dr. 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      albuterol (VENTOLIN HFA) 108 (90 Base) MCG/ACT inhaler Inhale 2 puffs into the lungs every 4 (four) hours as needed for shortness of breath or wheezing.      amiodarone (PACERONE) 200 MG tablet Take 200 mg by mouth daily.      apixaban (ELIQUIS) 5 MG TABS tablet Take 1 tablet (5 mg total) by mouth 2 (two) times daily. 60 tablet 3    azithromycin (ZITHROMAX) 250 MG tablet Take 250-500 mg by  mouth See admin instructions. Take 500mg  by mouth on Day 1, then take 250mg  daily on Days 2-5.      bacitracin ointment Apply 1 application topically 2 (two) times daily. Start applying antibiotic ointment starting tomorrow 14 g 0    [EXPIRED] cephALEXin (KEFLEX) 500 MG capsule Take 1 capsule (500 mg total) by mouth 4 (four) times daily for 7 days. 28 capsule 0    cholecalciferol (VITAMIN D3) 25 MCG (1000 UNIT) tablet Take 1,000 Units by mouth every morning.      dexamethasone (DECADRON) 2 MG tablet Take 6 mg by mouth every morning.      diclofenac Sodium (VOLTAREN) 1 % GEL Apply 2 g topically 4 (four) times daily as needed (pain). Do not take ibuprofen or aleve while using this gel      doxycycline (VIBRAMYCIN) 100 MG capsule Take 100 mg by mouth 2 (two) times daily. For 5 days      furosemide (LASIX) 20 MG tablet Take 20 mg by mouth every morning.      gabapentin (NEURONTIN) 300 MG capsule Take 1 capsule (300 mg total) by mouth 2 (two) times daily. 180 capsule 3    guaiFENesin (MUCINEX) 600 MG 12 hr tablet Take 1,200 mg by mouth 2 (two) times daily.      hydrALAZINE (APRESOLINE) 25 MG tablet Take 25 mg by  mouth every 8 (eight) hours.      hydrocortisone cream 1 % Apply 1 application topically 2 (two) times daily. 30 g 0    insulin detemir (LEVEMIR FLEXTOUCH) 100 UNIT/ML FlexPen Inject 5 Units into the skin daily.      levothyroxine (SYNTHROID) 175 MCG tablet Take 1 tablet (175 mcg total) by mouth daily before breakfast. 90 tablet 3    loperamide (IMODIUM A-D) 2 MG tablet Take 1 tablet (2 mg total) by mouth 4 (four) times daily as needed for diarrhea or loose stools. 30 tablet 0    metFORMIN (GLUCOPHAGE) 1000 MG tablet TAKE 1 TABLET BY MOUTH TWICE DAILY WITH A MEAL. (Patient taking differently: Take 1,000 mg by mouth in the morning and at bedtime.) 180 tablet 0    Omega-3 Fatty Acids (FISH OIL) 1000 MG CAPS Take 1,000 mg by mouth 2 (two) times daily.      pantoprazole (PROTONIX) 40 MG tablet Take 1  tablet (40 mg total) by mouth daily before breakfast. 30 tablet 0    predniSONE (DELTASONE) 20 MG tablet 2 po at same time daily for 5 days (Patient taking differently: Take 40 mg by mouth daily. 2 po at same time daily for 5 days) 10 tablet 0    Semaglutide, 1 MG/DOSE, (OZEMPIC, 1 MG/DOSE,) 2 MG/1.5ML SOPN Inject 1 mg into the skin once a week. 4.5 mL 3    simvastatin (ZOCOR) 40 MG tablet Take 40 mg by mouth at bedtime. For cholesterol      simvastatin (ZOCOR) 80 MG tablet Take 0.5 tablets (40 mg total) by mouth at bedtime. (Patient not taking: Reported on 10/23/2020) 15 tablet 0 Not Taking   tamsulosin (FLOMAX) 0.4 MG CAPS capsule TAKE TWO CAPSULES BY MOUTH AT BEDTIME (Patient taking differently: Take 0.8 mg by mouth daily after supper.) 180 capsule 0    vitamin B-12 (CYANOCOBALAMIN) 500 MCG tablet Take 500 mcg by mouth daily.        Plan:  Continue Apixaban home dose (5 mg po bid) Monitor Hgb   Bonham Zingale J Eyonna Sandstrom BS, PharmD, BCPS, RPh 11/03/2020,11:09 AM

## 2020-11-03 NOTE — Progress Notes (Signed)
Occupational Therapy Treatment Patient Details Name: [name] MRN: [id number] DOB: 1945-06-22 Today's Date: 11/03/2020   History of present illness 75 y.o. M admitted on 10/22/20 due to AMS. On 9/30 pt had an I&D of LLE abcess. 10/5 L LE debridement with compartment releases and application of VAC dressing.   Past medical history of A. 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   OT comments  Patient progressing slowly but steadily and showed improved ability to power up from EOB to RW with Min As, compared to previous session where pt required Mod As and "significant assistance" to pivot to chair.  Pt chose to perform ADLs at chair level and declined standing at sink, with need of full setup due to UE tremor. PT with decreased awareness of situation and struggled with 1 and 2 step commands.  Patient remains limited by confusion and hardness of hearing, BUE gross tremor,  generalized weakness and decreased activity tolerance along with deficits noted below. Pt continues to demonstrate good rehab potential and would benefit from continued skilled OT to increase safety and independence with ADLs and functional transfers to allow pt to return home safely and reduce caregiver burden and fall risk.    Recommendations for follow up therapy are one component of a multi-disciplinary discharge planning process, led by the attending physician.  Recommendations may be updated based on patient status, additional functional criteria and insurance authorization.    Follow Up Recommendations  SNF    Equipment Recommendations  3 in 1 bedside commode    Recommendations for Other Services      Precautions / Restrictions Precautions Precautions: Fall Restrictions Weight Bearing Restrictions: No       Mobility Bed Mobility Overal bed mobility: Needs Assistance Bed  Mobility: Supine to Sit     Supine to sit: Min guard          Transfers Overall transfer level: Needs assistance Equipment used: Rolling walker (2 wheeled) Transfers: Sit to/from [facility] Sit to Stand: Min assist Stand pivot transfers: Min assist            Balance Overall balance assessment: Needs assistance Sitting-balance support: Feet supported Sitting balance-Leahy Scale: Good     Standing balance support: Bilateral upper extremity supported Standing balance-Leahy Scale: Poor                             ADL either performed or assessed with clinical judgement   ADL Overall ADL's : Needs assistance/impaired     Grooming: Supervision/safety;Sitting Grooming Details (indicate cue type and reason): cues for sequencing and attention to task. Pt performed oral hygiene in recliner with assistance needed to place paste on toothbrush due to pt's gross tremor. Pt educated on toothpaste tube to mouth for increased ease, but pt did not demonstrate back. Pt able to perform all oral hygiene, reach for and hold basin to mouth and sip/swish/expectorate with supervision only.         Upper Body Dressing : Min guard;Sitting       Toilet Transfer: Minimal assistance;Stand-pivot;RW Toilet Transfer Details (indicate cue type and reason): Pt stood from EOB with Min As and used RW to pivot to recliner. Pt with poor eccentric control when descending to chair and did not follow cues for UE reach back.  Pt educated once safely seated in chair on importance of use  of BUEs and slow, controlled sit for safety. Pt did verbalize understanding.   Toileting - Clothing Manipulation Details (indicate cue type and reason): Pt on external male catheter.     Functional mobility during ADLs: Minimal assistance;Rolling walker       Vision Baseline Vision/History: 1 Wears glasses Ability to See in Adequate Light: 0 Adequate Patient Visual Report: No change from  baseline     Perception     Praxis      Cognition Arousal/Alertness: Awake/alert Behavior During Therapy: Flat affect Overall Cognitive Status: Impaired/Different from baseline Area of Impairment: Orientation;Memory;Safety/judgement;Awareness;Problem solving;Following commands;Attention                 Orientation Level: Disoriented to;Time;Situation (Increased cue to specify place other than "". Pt able to identify month and year, not date or day of week) Current Attention Level: Sustained Memory: Decreased short-term memory Following Commands: Follows one step commands inconsistently (HOH) Safety/Judgement: Decreased awareness of safety;Decreased awareness of deficits Awareness: Intellectual Problem Solving: Slow processing;Decreased initiation;Requires verbal cues;Requires tactile cues;Difficulty sequencing General Comments: Needs frequent cues to attend to task, and initiate activities. Easily distracted with decreased safety with line mangement during mobility.        Exercises     Shoulder Instructions       General Comments      Pertinent Vitals/ Pain       Pain Assessment: No/denies pain Faces Pain Scale: No hurt  Home Living                                          Prior Functioning/Environment              Frequency  Min 2X/week        Progress Toward Goals  OT Goals(current goals can now be found in the care plan section)  Progress towards OT goals: Progressing toward goals  Acute Rehab OT Goals OT Goal Formulation: Patient unable to participate in goal setting Time For Goal Achievement: 11/07/20 Potential to Achieve Goals: Good  Plan Discharge plan remains appropriate    Co-evaluation                 AM-PAC OT "6 Clicks" Daily Activity     Outcome Measure   Help from another person eating meals?: A Little Help from another person taking care of personal grooming?: A Little Help from another  person toileting, which includes using toliet, bedpan, or urinal?: A Lot Help from another person bathing (including washing, rinsing, drying)?: A Lot Help from another person to put on and taking off regular upper body clothing?: A Lot Help from another person to put on and taking off regular lower body clothing?: A Lot 6 Click Score: 14    End of Session Equipment Utilized During Treatment: Gait belt;Rolling walker  OT Visit Diagnosis: Unsteadiness on feet (R26.81);Other abnormalities of gait and mobility (R26.89);Muscle weakness (generalized) (M62.81)   Activity Tolerance Patient tolerated treatment well   Patient Left with call bell/phone within reach;in bed;with chair alarm set   Nurse Communication Mobility status        Time: 6812-7517 OT Time Calculation (min): 30 min  Charges: OT General Charges $OT Visit: 1 Visit OT Treatments $Self Care/Home Management : 8-22 mins $Therapeutic Activity: 8-22 mins  Anderson [place], [place] Office: [phone] 11/03/2020  Julien Girt 11/03/2020, 11:46 AM

## 2020-11-04 DIAGNOSIS — G9340 Encephalopathy, unspecified: Secondary | ICD-10-CM | POA: Diagnosis not present

## 2020-11-04 LAB — GLUCOSE, CAPILLARY
Glucose-Capillary: 137 mg/dL — ABNORMAL HIGH (ref 70–99)
Glucose-Capillary: 259 mg/dL — ABNORMAL HIGH (ref 70–99)
Glucose-Capillary: 136 mg/dL — ABNORMAL HIGH (ref 70–99)
Glucose-Capillary: 245 mg/dL — ABNORMAL HIGH (ref 70–99)

## 2020-11-04 LAB — BASIC METABOLIC PANEL
Anion gap: 9 (ref 5–15)
BUN: 30 mg/dL — ABNORMAL HIGH (ref 8–23)
CO2: 27 mmol/L (ref 22–32)
Calcium: 8.9 mg/dL (ref 8.9–10.3)
Chloride: 104 mmol/L (ref 98–111)
Creatinine, Ser: 1.99 mg/dL — ABNORMAL HIGH (ref 0.61–1.24)
GFR, Estimated: 34 mL/min — ABNORMAL LOW (ref >60)
Glucose, Bld: 141 mg/dL — ABNORMAL HIGH (ref 70–99)
Potassium: 4.2 mmol/L (ref 3.5–5.1)
Sodium: 140 mmol/L (ref 135–145)

## 2020-11-04 LAB — MAGNESIUM: Magnesium: 2.3 mg/dL (ref 1.7–2.4)

## 2020-11-04 MED ORDER — FUROSEMIDE 20 MG PO TABS
20.0000 mg | ORAL_TABLET | Freq: Every morning | ORAL | Status: DC
  Administered 2020-11-04 – 2020-11-10 (×7): 20 mg via ORAL
  Filled 2020-11-04 (×8): qty 1

## 2020-11-04 NOTE — Progress Notes (Signed)
Speech Language Pathology Treatment: Dysphagia  Patient Details Name: [name] MRN: [id number] DOB: 1945/03/11 Today's Date: 11/04/2020 Time: 0940-1000 SLP Time Calculation (min) (ACUTE ONLY): 20 min  Assessment / Plan / Recommendation Clinical Impression  Pt continues to demonstrate improving mentation. He is hard of hearing and benefits from repetition, visual reinforcement and increased volume. He is able to complete basic functional tasks like brushing teeth seated in chair with a basin though he is a bit messy due to tremor. He follows cues for following solids with pudding to clear oral residue and to clear wet vocal quality (noted with speech unrelated to liquid intake), though he will be unlikely to carry this over. Will need f/u at next level of care.   HPI HPI: Pt is a 75 y.o. male admitted on 10/22/20 due to AMS. On 9/30 pt had an I&D of LLE abcess. Dx Acute encephalopathy secondary to hypothyroidism; unintenstional noncompliance with Synthyroid suspected since pt was taking it with food. Past medical history of A. 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      SLP Plan  All goals met      Recommendations for follow up therapy are one component of a multi-disciplinary discharge planning process, led by the attending physician.  Recommendations may be updated based on patient status, additional functional criteria and insurance authorization.    Recommendations  Diet recommendations: Dysphagia 3 (mechanical soft);Thin liquid Liquids provided via: Cup;Straw Medication Administration: Whole meds with liquid Supervision: Patient able to self feed;Intermittent supervision to cue for compensatory strategies Compensations: Slow rate;Small sips/bites;Other (Comment);Clear throat intermittently (follow solids with puree)                Follow up  Recommendations: Skilled Nursing facility SLP Visit Diagnosis: Dysphagia, oral phase (R13.11) Plan: All goals met       GO                Macayla Ekdahl, Katherene Ponto  11/04/2020, 11:58 AM

## 2020-11-04 NOTE — Progress Notes (Signed)
HD 13 SUBJECTIVE:  Patient Summary: [name] is a 75 y.o. male (he/him/his) with a pertinent PMH of persistent atrial fibrillation (on Eliquis), complete heart block s/p PPM, grade II HFpEF, hypertension, hypothyroidism, diabetes mellitus, CKD IIIb, who presents to [facility] with altered mental status.   Overnight Events: Accepted scheduled meds  Interim History: [name] denies any acute complaints this AM, including pain. He slept well last night.  A&Ox1, to person and situation that brought him to the hospital.  He recalls being on the side of the road and waiting for help. After waiting for a long time, the police and EMS stopped to help him. However, he cannot remember why he is here. He would like to go home. We discussed our concerns regarding him going home and SNF placement. Patient refuses SNF placement currently.   OBJECTIVE:  Vital Signs: Vitals:   11/03/20 2000 11/04/20 0335 11/04/20 0742 11/04/20 0744  BP: 140/70 (!) 151/59 (!) 147/76 (!) 153/76  Pulse: 66 (!) 59  64  Resp: 20 20  17   Temp: 97.6 F (36.4 C) 98 F (36.7 C)  97.7 F (36.5 C)  TempSrc: Oral     SpO2: 98% 100%  97%  Weight:        Filed Weights   11/01/20 0500 11/02/20 0213 11/03/20 0200  Weight: 104.3 kg 102 kg 97.7 kg    Intake/Output Summary (Last 24 hours) at 11/04/2020 0948 Last data filed at 11/04/2020 0843 Gross per 24 hour  Intake 726 ml  Output 150 ml  Net 576 ml   Net IO Since Admission: -564.52 mL [11/04/20 0948]  Physical Exam:  Physical Exam Vitals and nursing note reviewed.  HENT:     Head: Normocephalic.  Eyes:     Extraocular Movements: Extraocular movements intact.  Cardiovascular:     Rate and Rhythm: Normal rate and regular rhythm.     Pulses:          Dorsalis pedis pulses are 1+ on the right side and 1+ on the left side.     Heart sounds: Murmur (Systolic) heard.  Pulmonary:     Effort: Pulmonary effort is normal.     Breath sounds: Wheezing (Some expiratory  wheezing at lung bases) present. No rales.     Comments: Near absent breath sounds on Right lower and Right middle lobe on auscultation. Left side, breath sounds present. Musculoskeletal:     Cervical back: Normal range of motion.     Right lower leg: 2+ Pitting Edema present.     Left lower leg: 2+ Pitting Edema present.  Skin:    General: Skin is warm and dry.     Comments: Blanchable heels b/l  Neurological:     Mental Status: He is alert. Mental status is at baseline. He is disoriented.     Cranial Nerves: No facial asymmetry.     Motor: Tremor present.     Comments: Right hand and foot resting tremor noted, 2 beat clonus on right foot    ASSESSMENT/PLAN:  Assessment: Principal Problem:   Acute encephalopathy Active Problems:   Benign prostatic hyperplasia   Type 2 diabetes mellitus with diabetic neuropathy, unspecified (HCC)   Hypothyroidism   Urgency incontinence   Protein-calorie malnutrition, severe  Plan: #Acute Encephalopathy, resolved #Hypoactive Delirium Presented with AMS after being found in his car for >10 hours. A&Ox1, seems to be at baseline mental status this AM, see above for details. SLP evaluated patient, and recommended Dysph 3 (mech  soft) diet.  - Continue delirium precautions, limiting tubes and lines.  #HFpEF (Grade II DD) Home lasix was held due to AMS, refusing meds, and low PO intake. Patient is no longer altered and NPO, SLP has evaluated and recommended a soft mech diet. Hypervolemic on exam, will restart home lasix. - Restart home Lasix 20mg  PO - Strict I&O, daily weights, Mg2+  #Protein-calorie malnutrition, severe Patient had continued weight loss concurrent with increasing falls, concerned for underlying dementia, possible, self-neglect, and possible malignancy. Consulted dietitian, appreciate assistance and recommendations. - Diet and supplements Per dietitian  # LLE Necrotic Ulcer s/p debridement with multiple compartment releases  10/28/2020, 7 Days Post-Op  Secondary to mechanical fall 9/16, poor wound healing in the setting of diabetes and vascular disease, which resulted in non-healing ulcer with necrosis and gangrene. S/p debridement 10/5 with multiple compartment release.  Antibiotics completed on 10/7. Per ortho, wound vac may be removed 10/12. - Orthopedics following; appreciate their recommendations  - Wound vac (placed 10/5) and will be removed 10/12 - Weightbearing as tolerated on the left - PT/OT with weight bearing as tolerated; will need SNF placement   #Likely Dementia Short term memory deficits were made obvious during hospitalization. Patient is unsafe to return home alone, pending SNF placement - Will obtain MMSE, once at baseline mental status or have doen outpatient  # Acute on chronic macrocytic anemia # Anemia of Chronic Disease Downtrend in hemoglobin over the last 3 months with no clear source. Despite macrocytic nature, folate and B12 are WNL. Reticulocyte index suggests hypo-proliferation. Hb stable, we will continue to monitor. - Trend hemoglobin  - Transfuse as needed for hemoglobin < 7 - We will have patient follow up outpatient  # Chronic Urinary Incontinence # Hx of BPH Difficult to quantify urinary output, as patient he is removing his condom cath.  - Bladder scans Q8h  - Continue tamsulosin 0.8 mg daily - Follow-up with urology outpatient  # Recurrent Falls  # History of Peripheral Neuropathy  - DC gabapentin 300 mg twice daily for delirium precautions - SNF placement   #AoCKDIII Baseline Cr 1.4-1.5. Likely from recent IV Lasix.  - Avoid nephrotoxic agents   #Type II Diabetes Mellitus HbA1c 7.8%. - Levemir 5U + SSI - CBG monitoring  - DC Gabapentin 300 mg twice daily, to decrease in hospital delirium - Will have patient follow-up outpatient for more optimal medical management and encourage lifestyle modification   #Persistent atrial fibrillation on Eliquis #Hx of  complete heart block s/p PPM CHA2DS2VASc Score - 5 HASBLED Score- 2.  - Continue Amiodarone - Continue home Eliquis 5mg  BID   # HTN  - Continue home Hydralazine 25mg  q8h   Hypothyroidism - Continue Synthroid 175 mcg  Medical history making Of note, per patient's brother [name]), patient has a son whom he has not communicated with for the last 5 to 6 years.  Also states that the patient was not part of his son's upbringing.  Because of patient's memory deficit and mental status, this leaves [name] as the only other next of kin, for medical decision making.  Best Practice: Diet: Dys 3, mechanical soft Pain: Acetaminophen 1000mg  TID [facility] BM: MiraLAX and Senokot scheduled IVF: Fluids: none VTE: SCDs Start: 10/29/20 1124 Code: Full Therapy Recs: SNF, and DME 3 in 1 bedside commode  DISPO: Anticipated discharge in 1-2 days days to Skilled nursing facility pending Placement  Signature: Merrily Brittle, DO Psychiatry Resident, PGY-1 [facility] Teaching Service Pager: [phone] After  5pm on weekdays and 1pm on weekends: On Call pager [phone]  11/04/2020, 9:48 AM

## 2020-11-04 NOTE — Progress Notes (Signed)
Physical Therapy Treatment Patient Details Name: [name] MRN: [id number] DOB: 1945-12-10 Today's Date: 11/04/2020   History of Present Illness 75 y.o. M admitted on 10/22/20 due to AMS. On 9/30 pt had an I&D of LLE abcess. 10/5 L LE debridement with compartment releases and application of VAC dressing.   Past medical history of A. 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    PT Comments    Patient received in recliner, pleasantly confused but perseverating on getting dressed and getting his wallet so he can go home. More alert and able to mobilize today- but with very poor safety awareness and problem solving, as well as very little insight into deficits. Often got himself into situations where he started to complete a task, then changed his mind and tried to do a different task, putting himself at high risk of fall and needing external intervention to maintain balance. Left up in recliner with all needs met, chair alarm active. Continue to recommend SNF.    Recommendations for follow up therapy are one component of a multi-disciplinary discharge planning process, led by the attending physician.  Recommendations may be updated based on patient status, additional functional criteria and insurance authorization.  Follow Up Recommendations  SNF;Other (comment) (will need max HHPT services and potential PCA if he continues to refuse SNF)     Equipment Recommendations  Rolling walker with 5" wheels;3in1 (PT)    Recommendations for Other Services       Precautions / Restrictions Precautions Precautions: Fall Restrictions Weight Bearing Restrictions: No     Mobility  Bed Mobility               General bed mobility comments: up in recliner upon entry    Transfers Overall transfer level: Needs assistance Equipment used: Rolling walker (2  wheeled) Transfers: Sit to/from Stand Sit to Stand: Mod assist         General transfer comment: needs ModA and rocking to get up to full standing position- very unsafe when going from stand to sit due to tendency to just hold onto RW and "flop" into chair  Ambulation/Gait Ambulation/Gait assistance: Min assist Gait Distance (Feet): 52 Feet (40f, 671f [id number] device: Rolling walker (2 wheeled) Gait Pattern/deviations: Step-through pattern;Decreased step length - right;Decreased step length - left;Trunk flexed;Wide base of support;Drifts right/left Gait velocity: decreased   General Gait Details: very impulsive and unsafe with gait- tends to push RW off to the side or way too far ahead of himself when turning; also tends to make lots of decisions on where to go, then impulsively changes his mind which is a big fall risk in itself as he ends up poorly positioned/unbalanced for the task he is trying to accomplish.   Stairs             Wheelchair Mobility    Modified Rankin (Stroke Patients Only)       Balance Overall balance assessment: Needs assistance Sitting-balance support: Feet supported Sitting balance-Leahy Scale: Good Sitting balance - Comments: preference for BUE support   Standing balance support: Bilateral upper extremity supported Standing balance-Leahy Scale: Poor Standing balance comment: reliant on significant assist today                            Cognition Arousal/Alertness: Awake/alert Behavior During Therapy: Flat affect;Impulsive Overall Cognitive Status: Impaired/Different from baseline  Orientation Level: Disoriented to;Situation Current Attention Level: Sustained Memory: Decreased short-term memory Following Commands: Follows one step commands inconsistently;Follows one step commands with increased time Safety/Judgement: Decreased awareness of safety;Decreased awareness of deficits Awareness:  Intellectual Problem Solving: Slow processing;Decreased initiation;Requires verbal cues;Requires tactile cues;Difficulty sequencing General Comments: much more alert and aware today- but still with STM deficits, asked at least 10 times why he can't get dressed during session; very poor safety awareness and reduced insight into deficits- often got himself into precarious situations and needed external assist to prevent a fall with little awareness of such      Exercises      General Comments        Pertinent Vitals/Pain Pain Assessment: Faces Faces Pain Scale: No hurt Pain Intervention(s): Limited activity within patient's tolerance;Monitored during session    Home Living                      Prior Function            PT Goals (current goals can now be found in the care plan section) Acute Rehab PT Goals Patient Stated Goal: none stated PT Goal Formulation: With patient Time For Goal Achievement: 11/07/20 Potential to Achieve Goals: Good Progress towards PT goals: Progressing toward goals    Frequency    Min 3X/week      PT Plan Current plan remains appropriate;Equipment recommendations need to be updated    Co-evaluation              AM-PAC PT "6 Clicks" Mobility   Outcome Measure  Help needed turning from your back to your side while in a flat bed without using bedrails?: A Little Help needed moving from lying on your back to sitting on the side of a flat bed without using bedrails?: A Little Help needed moving to and from a bed to a chair (including a wheelchair)?: A Lot Help needed standing up from a chair using your arms (e.g., wheelchair or bedside chair)?: A Lot Help needed to walk in hospital room?: A Lot Help needed climbing 3-5 steps with a railing? : Total 6 Click Score: 13    End of Session Equipment Utilized During Treatment: Gait belt Activity Tolerance: Patient tolerated treatment well Patient left: in chair;with call bell/phone  within reach;with chair alarm set Nurse Communication: Mobility status PT Visit Diagnosis: Unsteadiness on feet (R26.81);Other abnormalities of gait and mobility (R26.89)     Time: 1224-4975 PT Time Calculation (min) (ACUTE ONLY): 12 min  Charges:  $Gait Training: 8-22 mins                    Windell Norfolk, DPT, PN2   Supplemental Physical Therapist [place]    Pager [phone] Acute Rehab Office [phone]

## 2020-11-04 NOTE — TOC Progression Note (Addendum)
Transition of Care [facility]) - Progression Note    Patient Details  Name: [name] MRN: [id number] Date of Birth: 11-14-1945  Transition of [facility]) CM/SW [place], RN Phone Number:[phone]  11/04/2020, 9:50 AM  Clinical Narrative:    CM spoke with Bryson Ha at [facility]. Per Bryson Ha the facility can accept the patient and the brother has to be in agreement that he is willing to sign for patient. CM attempted to call brother [name] [phone]) no answer and no voicemail option. CM at bedside to discuss discharge with the patient. Patient states that he is not going to rehab. He states that he is going home no matter what and he may come back in a few weeks for rehab. Patient is alert and oriented X4. CM has explained the rehab process to patient and patient continues to refuse. CM to reach out to PT for reevaluation. CM will continue to follow.   1120 Brother at bedside requesting to speak with CM. Cm at bedside to discuss bed offer from [facility] rehab. Patient states that he is not going. Brother states that patient can not live with him and that brother whom is confined to a wheelchair is unable to participate in patients care Patient continues to state that he is not going to rehab. Patient states he is going home and nowhere else.  Expected Discharge Plan: [place] Barriers to Discharge: Continued Medical Work up  Expected Discharge Plan and Services Expected Discharge Plan: [place] In-house Referral: NA Discharge Planning Services: CM Consult Post Acute Care Choice: [place] Living arrangements for the past 2 months: Single Family Home                 DME Arranged: N/A DME Agency: NA       HH Arranged: NA HH Agency: NA         Social Determinants of Health (SDOH) Interventions    Readmission Risk Interventions Readmission Risk Prevention Plan 10/26/2020  Transportation Screening Complete  PCP or  Specialist Appt within 3-5 Days Complete  HRI or Home Care Consult Complete  Social Work Consult for [place] Planning/Counseling Complete  Palliative Care Screening Not Applicable  Medication Review [employer]) Referral to Pharmacy  Some recent data might be hidden

## 2020-11-05 DIAGNOSIS — G9340 Encephalopathy, unspecified: Secondary | ICD-10-CM | POA: Diagnosis not present

## 2020-11-05 LAB — BASIC METABOLIC PANEL
Anion gap: 9 (ref 5–15)
BUN: 37 mg/dL — ABNORMAL HIGH (ref 8–23)
CO2: 24 mmol/L (ref 22–32)
Calcium: 8.6 mg/dL — ABNORMAL LOW (ref 8.9–10.3)
Chloride: 103 mmol/L (ref 98–111)
Creatinine, Ser: 2.06 mg/dL — ABNORMAL HIGH (ref 0.61–1.24)
GFR, Estimated: 33 mL/min — ABNORMAL LOW (ref >60)
Glucose, Bld: 203 mg/dL — ABNORMAL HIGH (ref 70–99)
Potassium: 4.8 mmol/L (ref 3.5–5.1)
Sodium: 136 mmol/L (ref 135–145)

## 2020-11-05 LAB — MAGNESIUM: Magnesium: 2.3 mg/dL (ref 1.7–2.4)

## 2020-11-05 LAB — GLUCOSE, CAPILLARY
Glucose-Capillary: 172 mg/dL — ABNORMAL HIGH (ref 70–99)
Glucose-Capillary: 160 mg/dL — ABNORMAL HIGH (ref 70–99)
Glucose-Capillary: 193 mg/dL — ABNORMAL HIGH (ref 70–99)
Glucose-Capillary: 191 mg/dL — ABNORMAL HIGH (ref 70–99)

## 2020-11-05 MED ORDER — RAMELTEON 8 MG PO TABS
8.0000 mg | ORAL_TABLET | Freq: Every day | ORAL | Status: DC
  Administered 2020-11-05 – 2020-11-09 (×5): 8 mg via ORAL
  Filled 2020-11-05 (×6): qty 1

## 2020-11-05 NOTE — Progress Notes (Signed)
Patient requesting medication to help him sleep and relax stating he hasn't slept as much as he is used to. Provider notified.

## 2020-11-05 NOTE — Progress Notes (Addendum)
HD 14 SUBJECTIVE:  Patient Summary: [name] is a 75 y.o. male (he/him/his) with a pertinent PMH of persistent atrial fibrillation (on Eliquis), complete heart block s/p PPM, grade II HFpEF, hypertension, hypothyroidism, diabetes mellitus, CKD IIIb, who presents to [facility] with altered mental status.   Overnight Events: Accepted scheduled meds. Per RN, patient was agitated, delirious and wanted to go home.  Interim History: [name] was seen this AM sitting up in a chair. States that he feels much better, did not sleep well, and wants to go home.   Otherwise he denies chest pain, SOB, abdominal pain, or any other acute complaints.  On second encounter to assess capacity. Patient continued to perseverate on wanting to go home in the morning.  States that he is in the hospital because of his leg, that he cannot remember how he ascertain the wound. Patient's understanding seemed superficial, as he just repeated what this author said verbatim. He was not able to and refused to answer his reasoning for wanting to leave early.   OBJECTIVE:  Vital Signs: Vitals:   11/04/20 0744 11/04/20 1259 11/04/20 2100 11/05/20 0534  BP: (!) 153/76 138/62 (!) 154/80 (!) 121/43  Pulse: 64 70 62 66  Resp: 17  16 17   Temp: 97.7 F (36.5 C) (!) 97.4 F (36.3 C) 98.7 F (37.1 C) 99 F (37.2 C)  TempSrc:   Oral Oral  SpO2: 97% 100% 100% 98%  Weight:   97.7 kg 102.3 kg  Height:   6\' 1"  (1.854 m)     Filed Weights   11/03/20 0200 11/04/20 2100 11/05/20 0534  Weight: 97.7 kg 97.7 kg 102.3 kg    Intake/Output Summary (Last 24 hours) at 11/05/2020 0639 Last data filed at 11/05/2020 0500 Gross per 24 hour  Intake 483 ml  Output 500 ml  Net -17 ml    Net IO Since Admission: -584.52 mL [11/05/20 0639]  Physical Exam:  Physical Exam Vitals and nursing note reviewed.  HENT:     Head: Normocephalic.  Eyes:     Extraocular Movements: Extraocular movements intact.  Cardiovascular:     Rate  and Rhythm: Normal rate and regular rhythm.     Pulses:          Dorsalis pedis pulses are 1+ on the right side and 1+ on the left side.     Heart sounds: Murmur (Systolic) heard.  Pulmonary:     Effort: Pulmonary effort is normal. No accessory muscle usage or respiratory distress.     Breath sounds: Decreased air movement present. Wheezing (Some expiratory wheezing at lung bases) present. No rales.     Comments: Near absent breath sounds and dullness on percussion on Right lower and Right middle lobe on auscultation.  Left side, breath sounds present and resonant on percussion. Musculoskeletal:     Cervical back: Normal range of motion.     Right lower leg: 2+ Pitting Edema present.     Left lower leg: 2+ Pitting Edema present.  Skin:    General: Skin is warm and dry.     Comments: Blanchable heels with calluses b/l. LLE postop site appears to be healing well, nontender to palpation, no purulent or any discharge, minimal erythema at edge, no bleeding, no necrosis.  Neurological:     Mental Status: He is alert. Mental status is at baseline. He is disoriented.     Cranial Nerves: No facial asymmetry.     Motor: Tremor present.  Comments: Right hand and foot resting tremor noted, 2 beat clonus on right foot    ASSESSMENT/PLAN:  Assessment: Principal Problem:   Acute encephalopathy Active Problems:   Benign prostatic hyperplasia   Type 2 diabetes mellitus with diabetic neuropathy, unspecified (HCC)   Hypothyroidism   Urgency incontinence   Protein-calorie malnutrition, severe  Plan: #Acute Encephalopathy, resolved #Hypoactive Delirium #Likely Dementia Presented with AMS after being found in his car for >10 hours. Patient is restless, and wants to go home. Short term memory deficits were made obvious during hospitalization. Patient is unsafe to return home alone, pending SNF placement.  Patient was not able to demonstrate understanding and reasoning to his hospitalization. Will  reassess again in the AM. - Continue delirium precautions, limiting tubes and lines. - Dysphagia 3 (mechanical soft) diet, per SLP - Will obtain MMSE, once at baseline mental status or have done outpatient  #HFpEF (Grade II DD) Home lasix was held due to AMS, refusing meds, and low PO intake. Patient is no longer altered and NPO, SLP has evaluated and recommended a soft mech diet. Hypervolemic on exam, home Lasix has been started - Continue home Lasix 20mg  PO - Strict I&O, daily weights, Mg2+  #Decreased breath sounds right base Decreased breath sounds and dullness to percussion, as stated above. There was evidence of right-sided effusion on chest x-ray 1 week prior that was assuming to be secondary to hypervolemia secondary to refusing meds.  It is still present after diuresing.  Patient is not in respiratory distress, is afebrile, not hypoxic, and denies shortness of breath.  CXR could be considered for further assessment.  #Protein-calorie malnutrition, severe Patient had continued weight loss concurrent with increasing falls, concerned for underlying dementia, possible self-neglect. Consulted dietitian, appreciate assistance and recommendations. - Diet and supplements per dietitian  #AoCKDIII Baseline Cr 1.4-1.5.  Elevated, likely from decreased p.o. intake and starting home Lasix. - Avoid nephrotoxic agents - BMP  # LLE Necrotic Ulcer s/p debridement with multiple compartment releases 10/28/2020, 8 Days Post-Op  Secondary to mechanical fall 9/16, poor wound healing in the setting of diabetes and vascular disease, which resulted in non-healing ulcer with necrosis and gangrene. S/p debridement 10/5 with multiple compartment release.  Antibiotics completed on 10/7.  Wound VAC removed.  Surgical site is healing well, we will continue to monitor. - Orthopedics following; appreciate their recommendations  - Weightbearing as tolerated on the left - PT/OT with weight bearing as tolerated; will  need SNF placement   # Acute on chronic macrocytic anemia # Anemia of Chronic Disease Downtrend in hemoglobin over the last 3 months with no clear source. Despite macrocytic nature, folate and B12 are WNL. Reticulocyte index suggests hypo-proliferation. Hb stable, we will continue to monitor. - Transfuse as needed for hemoglobin < 7 - We will have patient follow up outpatient  # Chronic Urinary Incontinence # Hx of BPH Difficult to quantify urinary output, as patient he is removing his condom cath.  - Bladder scans Q8h  - Continue tamsulosin 0.8 mg daily - Follow-up with urology outpatient if necessary  # Recurrent Falls  # History of Peripheral Neuropathy  - DC gabapentin 300 mg twice daily for delirium precautions - SNF placement   #Type II Diabetes Mellitus HbA1c 7.8%. - Levemir 5U + SSI - CBG monitoring  - Will have patient follow-up outpatient for more optimal medical management and encourage lifestyle modification   #Persistent atrial fibrillation on Eliquis #Hx of complete heart block s/p PPM CHA2DS2VASc Score - 5  HASBLED Score- 2.  - Continue Amiodarone - Continue home Eliquis 5mg  BID   # HTN  - Continue home Hydralazine 25mg  q8h   Hypothyroidism - Continue Synthroid 175 mcg  Medical history making Of note, per patient's brother [name]), patient has a son whom he has not communicated with for the last 5 to 6 years.  Also states that the patient was not part of his son's upbringing.  Because of patient's memory deficit and mental status, this leaves [name] as the only other next of kin, for medical decision making.  Best Practice: Diet: Dys 3, mechanical soft Pain: Acetaminophen 1000mg  TID [facility] BM: MiraLAX and Senokot scheduled IVF: Fluids: none VTE: SCDs Start: 10/29/20 1124 Code: Full Therapy Recs: SNF, and DME Rolling walker with 5" wheels, 3 in 1 bedside commode. will need max HHPT services and potential PCA if he continues to refuse SNF  DISPO:  Anticipated discharge in 1-2 days days to [facility]) pending [name] (brother) willing to sign for patient  Signature: Merrily Brittle, DO Psychiatry Resident, PGY-1 [facility] Teaching Service Pager: [phone] After 5pm on weekdays and 1pm on weekends: On Call pager [phone]  11/05/2020, 6:39 AM

## 2020-11-05 NOTE — Progress Notes (Signed)
Patient is adamant that he is going home today. Encouraged patient to wait and speak with provider during rounds today. He is insisting he is dressed and walked out. Reminded patient when provider is expected and to relax and remain in bed. Patient was asked if he had anyone to help him at home once discharged, patient responded very sternly "No and I don't need help!" Patient refuses SNF and any place other than home at this time.

## 2020-11-05 NOTE — Plan of Care (Signed)
  Problem: Education: Goal: Knowledge of General Education information will improve Description Including pain rating scale, medication(s)/side effects and non-pharmacologic comfort measures Outcome: Progressing   

## 2020-11-05 NOTE — Consult Note (Signed)
   [facility] Inpatient Consult   11/05/2020  YONAH TANGEMAN [birth year] [id number]  [place] Organization [ACO] Patient:  [employer]  Primary Care Provider:  Dettinger, Fransisca Kaufmann, MD, [place], an Embedded provider  Follow up:  LLOS; barriers  Reviewed inpatient [facility] notes and PT/OT recommendations for barriers.  Spoke with the patient via phone regarding allowing post hospital follow up with the [facility] chronic care management team. Patient continues to refuse a skilled nursing facility stay.  Patient did agree that this Probation officer could refer him to his Embedded CCM team for follow up. He states he is willing to have the RN CCM to follow up with him. Explained that his primary provider will call first then a nurse can be assigned to follow up if he qualifies for the CCM program.  For questions, please contact:  Natividad Brood, RN BSN [facility] Liaison  [phone] business mobile phone Toll free office [phone]  Fax number: [phone] [email or web address]

## 2020-11-05 NOTE — Discharge Instructions (Signed)

## 2020-11-06 DIAGNOSIS — G9340 Encephalopathy, unspecified: Secondary | ICD-10-CM

## 2020-11-06 LAB — CBC WITH DIFFERENTIAL/PLATELET
Abs Immature Granulocytes: 0.05 10*3/uL (ref 0.00–0.07)
Basophils Absolute: 0 10*3/uL (ref 0.0–0.1)
Basophils Relative: 1 %
Eosinophils Absolute: 0.2 10*3/uL (ref 0.0–0.5)
Eosinophils Relative: 5 %
HCT: 22.4 % — ABNORMAL LOW (ref 39.0–52.0)
Hemoglobin: 7.5 g/dL — ABNORMAL LOW (ref 13.0–17.0)
Immature Granulocytes: 1 %
Lymphocytes Relative: 22 %
Lymphs Abs: 1.1 10*3/uL (ref 0.7–4.0)
MCH: 33.5 pg (ref 26.0–34.0)
MCHC: 33.5 g/dL (ref 30.0–36.0)
MCV: 100 fL (ref 80.0–100.0)
Monocytes Absolute: 0.5 10*3/uL (ref 0.1–1.0)
Monocytes Relative: 10 %
Neutro Abs: 3.1 10*3/uL (ref 1.7–7.7)
Neutrophils Relative %: 61 %
Platelets: 189 10*3/uL (ref 150–400)
RBC: 2.24 MIL/uL — ABNORMAL LOW (ref 4.22–5.81)
RDW: 18.4 % — ABNORMAL HIGH (ref 11.5–15.5)
WBC: 5 10*3/uL (ref 4.0–10.5)
nRBC: 0 % (ref 0.0–0.2)

## 2020-11-06 LAB — BASIC METABOLIC PANEL
Anion gap: 7 (ref 5–15)
BUN: 40 mg/dL — ABNORMAL HIGH (ref 8–23)
CO2: 26 mmol/L (ref 22–32)
Calcium: 8.3 mg/dL — ABNORMAL LOW (ref 8.9–10.3)
Chloride: 103 mmol/L (ref 98–111)
Creatinine, Ser: 2.04 mg/dL — ABNORMAL HIGH (ref 0.61–1.24)
GFR, Estimated: 33 mL/min — ABNORMAL LOW (ref >60)
Glucose, Bld: 144 mg/dL — ABNORMAL HIGH (ref 70–99)
Potassium: 4.6 mmol/L (ref 3.5–5.1)
Sodium: 136 mmol/L (ref 135–145)

## 2020-11-06 LAB — MAGNESIUM: Magnesium: 2.4 mg/dL (ref 1.7–2.4)

## 2020-11-06 LAB — GLUCOSE, CAPILLARY
Glucose-Capillary: 176 mg/dL — ABNORMAL HIGH (ref 70–99)
Glucose-Capillary: 112 mg/dL — ABNORMAL HIGH (ref 70–99)
Glucose-Capillary: 240 mg/dL — ABNORMAL HIGH (ref 70–99)
Glucose-Capillary: 146 mg/dL — ABNORMAL HIGH (ref 70–99)

## 2020-11-06 NOTE — Progress Notes (Signed)
Patient is confused to time. He knows he's in [place] but thinks he is at [place] to recall year, DOB, full name, and president.

## 2020-11-06 NOTE — Progress Notes (Signed)
HD 15 SUBJECTIVE:  Patient Summary: [name] is a 75 y.o. male (he/him/his) with a pertinent PMH of persistent atrial fibrillation (on Eliquis), complete heart block s/p PPM, grade II HFpEF, hypertension, hypothyroidism, diabetes mellitus, CKD IIIb, who presents to [facility] with altered mental status.   Overnight Events: Very delirious, stripped off his gown.  Early this a.m., patient threatened to leave AMA, however was able to de-escalate.  Although dysphoric, he was willing to stay for 1 more week.  Interim History: [name] was seen this AM sitting up in a chair dressed in his home clothes. States that he feels much better, did not sleep well, and wants to go home.  However did agree to staying for an extra week for physical rehabilitation.  Otherwise he denies chest pain, SOB, abdominal pain, or any other acute complaints.  OBJECTIVE:  Vital Signs: Vitals:   11/05/20 2006 11/06/20 0500 11/06/20 0605 11/06/20 1342  BP: (!) 155/75  (!) 109/49 129/75  Pulse: 83  72 66  Resp: 19  18 20   Temp: 98 F (36.7 C)  98 F (36.7 C) 98.3 F (36.8 C)  TempSrc: Oral   Oral  SpO2: 100%  99% 98%  Weight:  102.3 kg    Height:        Filed Weights   11/04/20 2100 11/05/20 0534 11/06/20 0500  Weight: 97.7 kg 102.3 kg 102.3 kg    Intake/Output Summary (Last 24 hours) at 11/06/2020 1555 Last data filed at 11/06/2020 1342 Gross per 24 hour  Intake 1080 ml  Output 350 ml  Net 730 ml   Net IO Since Admission: 468.48 mL [11/06/20 1555]  Physical Exam:  Physical Exam Vitals and nursing note reviewed.  HENT:     Head: Normocephalic.  Eyes:     Extraocular Movements: Extraocular movements intact.  Cardiovascular:     Rate and Rhythm: Normal rate and regular rhythm.     Pulses:          Dorsalis pedis pulses are 1+ on the right side and 1+ on the left side.     Heart sounds: Murmur (Systolic) heard.  Pulmonary:     Effort: Pulmonary effort is normal. No accessory muscle usage or  respiratory distress.     Breath sounds: Decreased air movement present. Wheezing (Some expiratory wheezing at lung bases) present. No rales.     Comments: Near absent breath sounds and dullness on percussion on Right lower and Right middle lobe on auscultation.  Left side, breath sounds present and resonant on percussion. Musculoskeletal:     Cervical back: Normal range of motion.     Right lower leg: 2+ Pitting Edema present.     Left lower leg: 2+ Pitting Edema present.  Skin:    General: Skin is warm and dry.  Neurological:     Mental Status: He is alert. Mental status is at baseline.     Cranial Nerves: No facial asymmetry.     Motor: Tremor present.     Comments: Right hand and foot resting tremor noted, 2 beat clonus on right foot    ASSESSMENT/PLAN:  Assessment: Principal Problem:   Acute encephalopathy Active Problems:   Benign prostatic hyperplasia   Type 2 diabetes mellitus with diabetic neuropathy, unspecified (HCC)   Hypothyroidism   Urgency incontinence   Protein-calorie malnutrition, severe  Plan: #Acute Encephalopathy, resolved #Hypoactive Delirium #Likely Dementia Presented with AMS after being found in his car for >10 hours. Patient is restless, and wants  to go home. Short term memory deficits were made obvious during hospitalization. Patient is unsafe to return home alone, pending SNF placement.  Patient was not able to demonstrate understanding and reasoning to his hospitalization. Will reassess again in the AM. - Continue delirium precautions, limiting tubes and lines. - Dysphagia 3 (mechanical soft) diet, per SLP - Will obtain MMSE, once at baseline mental status or have done outpatient  #HFpEF (Grade II DD) Home lasix was held due to AMS, refusing meds, and low PO intake. Patient is no longer altered and NPO, SLP has evaluated and recommended a soft mech diet. Hypervolemic on exam, will restart home lasix.  - Continue home Lasix 20mg  PO - Strict I&O,  daily weights, TG6+ - Systolic murmur that was not present on admission, outpatient echo  #Right sided consolidation Decreased breath sounds and dull to percussion, as stated above. There was evidence of right-sided on chest x-ray 1 week prior that was assuming to be secondary to hypovolemia secondary to refusing meds.  It is still present after diuresing, decreased breath sounds from right upper to lower lung field.  Patient is not in respiratory distress, is afebrile, and denies shortness of breath.  May require repeat chest x-ray, otherwise stable right now.  #Protein-calorie malnutrition, severe Patient had continued weight loss concurrent with increasing falls, concerned for underlying dementia, possible, self-neglect, and possible malignancy. Consulted dietitian, appreciate assistance and recommendations. - Diet and supplements per dietitian  #AoCKDIII Baseline Cr 1.4-1.5.  - Avoid nephrotoxic agents - BMP  # LLE Necrotic Ulcer s/p debridement with multiple compartment releases 10/28/2020, 9 Days Post-Op  Secondary to mechanical fall 9/16, poor wound healing in the setting of diabetes and vascular disease, which resulted in non-healing ulcer with necrosis and gangrene. S/p debridement 10/5 with multiple compartment release.  Antibiotics completed on 10/7. Per ortho, wound vac may be removed 10/12.  Surgical site is healing well, we will continue to monitor. - Weightbearing as tolerated on the left - PT/OT with weight bearing as tolerated; will need SNF placement   # Acute on chronic macrocytic anemia # Anemia of Chronic Disease Downtrend in hemoglobin over the last 3 months with no clear source. Despite macrocytic nature, folate and B12 are WNL. Reticulocyte index suggests hypo-proliferation. Hb stable, we will continue to monitor. - Transfuse as needed for hemoglobin < 7 - We will have patient follow up outpatient  # Chronic Urinary Incontinence # Hx of BPH Difficult to quantify  urinary output, as patient he is removing his condom cath.  - Bladder scans Q8h  - Continue tamsulosin 0.8 mg daily - Follow-up with urology outpatient  # Recurrent Falls  # History of Peripheral Neuropathy  - DC gabapentin 300 mg twice daily for delirium precautions - SNF placement   #Type II Diabetes Mellitus HbA1c 7.8%. - Levemir 5U + SSI - CBG monitoring  - Will have patient follow-up outpatient for more optimal medical management and encourage lifestyle modification   #Persistent atrial fibrillation on Eliquis #Hx of complete heart block s/p PPM CHA2DS2VASc Score - 5 HASBLED Score- 2.  - Continue Amiodarone - Continue home Eliquis 5mg  BID   # HTN  - Continue home Hydralazine 25mg  q8h   Hypothyroidism - Continue Synthroid 175 mcg  Medical decision making Patient lacks capacity, he is unable to understand the circumstances and apply consequences to self. Also has memory deficits. Of note, per patient's brother [name]), patient has a son whom he has not communicated with for the last 5 to  6 years. Also states that the patient was not part of his son's upbringing.  Because of patient's memory deficit and mental status, this leaves [name] as the only other next of kin, for medical decision making.  Best Practice: Diet: Dys 3, mechanical soft Pain: Acetaminophen 1000mg  TID [place] BM: MiraLAX and Senokot scheduled IVF: Fluids: none VTE: SCDs Start: 10/29/20 1124 Code: Full Therapy Recs: SNF, and DME Rolling walker with 5" wheels, 3 in 1 bedside commode. will need max HHPT services and potential PCA if he continues to refuse SNF  DISPO: Anticipated discharge in 1-2 days days to [facility]) pending [name] (brother) willing to sign for patient  Signature: Merrily Brittle, DO Psychiatry Resident, PGY-1 [facility] Teaching Service Pager: [phone] After 5pm on weekdays and 1pm on weekends: On Call pager [phone]  11/06/2020, 3:55 PM

## 2020-11-06 NOTE — Progress Notes (Signed)
Physical Therapy Treatment Patient Details Name: [name] MRN: [id number] DOB: [birth year] Today's Date: 11/06/2020   History of Present Illness 75 y.o. M admitted on 10/22/20 due to AMS. On 9/30 pt had an I&D of LLE abcess. 10/5 L LE debridement with compartment releases and application of VAC dressing.   Past medical history of A. 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    PT Comments    Pt disgruntled upon PT arrival, states "they're making me stay another week, ... I am in a bad mood". Pt ambulatory x80 ft with close guard for safety, requiring extended seated rest break to recover fatigue prior to return to room. Pt remains adamant about d/c home, recommending HHPT and increased aide services to assist pt with ADLs and mobility. Will continue to follow.    Recommendations for follow up therapy are one component of a multi-disciplinary discharge planning process, led by the attending physician.  Recommendations may be updated based on patient status, additional functional criteria and insurance authorization.  Follow Up Recommendations  Other (comment);Home health PT;Supervision for mobility/OOB (HHPT with increased support of aide/PCA, pt refusing SNF)     Equipment Recommendations  Rolling walker with 5" wheels;3in1 (PT)    Recommendations for Other Services       Precautions / Restrictions Precautions Precautions: Fall Restrictions Weight Bearing Restrictions: No     Mobility  Bed Mobility Overal bed mobility: Needs Assistance             General bed mobility comments: up in recliner upon entry    Transfers Overall transfer level: Needs assistance Equipment used: Rolling walker (2 wheeled) Transfers: Sit to/from Stand Sit to Stand: Min guard;Min assist         General transfer comment: close guard for rise from recliner,  assist for boost up when rising from chair in hallway.  Ambulation/Gait Ambulation/Gait assistance: Min guard Gait Distance (Feet): 80 Feet (x2 - 8 minute seated rest break) Assistive device: Rolling walker (2 wheeled) Gait Pattern/deviations: Step-through pattern;Trunk flexed;Wide base of support;Drifts right/left;Shuffle;Decreased stride length Gait velocity: decr   General Gait Details: close guard for safety, verbal cuing for upright posture and placement within RW x2, not adhered to well. Pt fatigues at 18 ft, requiring extended seated rest to recover fatigue   Stairs             Wheelchair Mobility    Modified Rankin (Stroke Patients Only)       Balance Overall balance assessment: Needs assistance Sitting-balance support: Feet supported Sitting balance-Leahy Scale: Good     Standing balance support: Bilateral upper extremity supported Standing balance-Leahy Scale: Poor Standing balance comment: reliant on RW in standing                            Cognition Arousal/Alertness: Awake/alert Behavior During Therapy: Flat affect Overall Cognitive Status: Impaired/Different from baseline Area of Impairment: Orientation;Attention;Following commands;Safety/judgement;Problem solving                 Orientation Level: Disoriented to;Situation Current Attention Level: Sustained   Following Commands: Follows one step commands with increased time Safety/Judgement: Decreased awareness of deficits;Decreased awareness of safety   Problem Solving: Slow processing;Decreased initiation;Requires verbal cues;Requires tactile cues;Difficulty sequencing General Comments: Pt tangential in conversation, difficult to follow. Pt states x2 during session "they are making me stay another week", no indication  of this in the chart. Pt follows most commands, at times does not follow safety cues during gait.      Exercises      General Comments General comments (skin  integrity, edema, etc.): pt donned shoes with very increased time, small white insects in bilat shoes RN notified      Pertinent Vitals/Pain Pain Assessment: No/denies pain    Home Living                      Prior Function            PT Goals (current goals can now be found in the care plan section) Acute Rehab PT Goals Patient Stated Goal: none stated PT Goal Formulation: With patient Time For Goal Achievement: 11/07/20 Potential to Achieve Goals: Good Progress towards PT goals: Progressing toward goals    Frequency    Min 3X/week      PT Plan Current plan remains appropriate    Co-evaluation              AM-PAC PT "6 Clicks" Mobility   Outcome Measure  Help needed turning from your back to your side while in a flat bed without using bedrails?: A Little Help needed moving from lying on your back to sitting on the side of a flat bed without using bedrails?: A Little Help needed moving to and from a bed to a chair (including a wheelchair)?: A Little Help needed standing up from a chair using your arms (e.g., wheelchair or bedside chair)?: A Little Help needed to walk in hospital room?: A Little Help needed climbing 3-5 steps with a railing? : A Lot 6 Click Score: 17    End of Session Equipment Utilized During Treatment: Gait belt Activity Tolerance: Patient tolerated treatment well Patient left: in chair;with call bell/phone within reach;with chair alarm set Nurse Communication: Mobility status PT Visit Diagnosis: Unsteadiness on feet (R26.81);Other abnormalities of gait and mobility (R26.89)     Time: 1020-1041 PT Time Calculation (min) (ACUTE ONLY): 21 min  Charges:  $Gait Training: 8-22 mins                    Stacie Glaze, PT DPT Acute Rehabilitation Services Pager [phone]  Office [phone]    [place] 11/06/2020, 10:56 AM

## 2020-11-06 NOTE — Progress Notes (Signed)
Patient up and refusing to go back to bed. He has ripped off his gown and got his belongings. He has dressed himself and put his boots on refusing to get back into bed. Patient sitting in recliner demanding to go home and for staff to take him to his car. Patient believes car is at hospital, I am unsure of the location of his vehicle. Will remain with patient and monitor for continued safety. Asked patient to remain seated in chair on a chair alarm. Patient is requesting to be escorted out.

## 2020-11-06 NOTE — TOC Progression Note (Addendum)
Transition of Care [facility]) - Progression Note    Patient Details  Name: [name] MRN: [id number] Date of Birth: [birth year]  Transition of Care [facility]) CM/SW [place], RN Phone Number:[phone]  11/06/2020, 1:20 PM  Clinical Narrative:    Patient is now agreeable to go to rehab. Brother will need to be agreeable to sign for the patient. CM spoke with brother [name] @ [phone] who is agreeable to sign for patient to go to rehab. CM to reach out to [facility].  1340 CM attempted to call Ebony Hail for [facility] admission. NO answer voicemail has been left.    Expected Discharge Plan: [place] Barriers to Discharge: Continued Medical Work up  Expected Discharge Plan and Services Expected Discharge Plan: [place] In-house Referral: NA Discharge Planning Services: CM Consult Post Acute Care Choice: [place] Living arrangements for the past 2 months: Single Family Home                 DME Arranged: N/A DME Agency: NA       HH Arranged: NA HH Agency: NA         Social Determinants of Health (SDOH) Interventions    Readmission Risk Interventions Readmission Risk Prevention Plan 10/26/2020  Transportation Screening Complete  PCP or Specialist Appt within 3-5 Days Complete  HRI or Home Care Consult Complete  Social Work Consult for [place] Planning/Counseling Complete  Palliative Care Screening Not Applicable  Medication Review Press photographer) Referral to Pharmacy  Some recent data might be hidden

## 2020-11-07 DIAGNOSIS — G9340 Encephalopathy, unspecified: Secondary | ICD-10-CM | POA: Diagnosis not present

## 2020-11-07 LAB — BASIC METABOLIC PANEL
Anion gap: 7 (ref 5–15)
BUN: 44 mg/dL — ABNORMAL HIGH (ref 8–23)
CO2: 25 mmol/L (ref 22–32)
Calcium: 8.7 mg/dL — ABNORMAL LOW (ref 8.9–10.3)
Chloride: 104 mmol/L (ref 98–111)
Creatinine, Ser: 1.97 mg/dL — ABNORMAL HIGH (ref 0.61–1.24)
GFR, Estimated: 34 mL/min — ABNORMAL LOW (ref >60)
Glucose, Bld: 105 mg/dL — ABNORMAL HIGH (ref 70–99)
Potassium: 4.6 mmol/L (ref 3.5–5.1)
Sodium: 136 mmol/L (ref 135–145)

## 2020-11-07 LAB — CBC WITH DIFFERENTIAL/PLATELET
Abs Immature Granulocytes: 0.05 10*3/uL (ref 0.00–0.07)
Basophils Absolute: 0.1 10*3/uL (ref 0.0–0.1)
Basophils Relative: 1 %
Eosinophils Absolute: 0.2 10*3/uL (ref 0.0–0.5)
Eosinophils Relative: 5 %
HCT: 24.2 % — ABNORMAL LOW (ref 39.0–52.0)
Hemoglobin: 7.7 g/dL — ABNORMAL LOW (ref 13.0–17.0)
Immature Granulocytes: 1 %
Lymphocytes Relative: 24 %
Lymphs Abs: 1.3 10*3/uL (ref 0.7–4.0)
MCH: 32.4 pg (ref 26.0–34.0)
MCHC: 31.8 g/dL (ref 30.0–36.0)
MCV: 101.7 fL — ABNORMAL HIGH (ref 80.0–100.0)
Monocytes Absolute: 0.5 10*3/uL (ref 0.1–1.0)
Monocytes Relative: 10 %
Neutro Abs: 3.3 10*3/uL (ref 1.7–7.7)
Neutrophils Relative %: 59 %
Platelets: 183 10*3/uL (ref 150–400)
RBC: 2.38 MIL/uL — ABNORMAL LOW (ref 4.22–5.81)
RDW: 18.5 % — ABNORMAL HIGH (ref 11.5–15.5)
WBC: 5.4 10*3/uL (ref 4.0–10.5)
nRBC: 0 % (ref 0.0–0.2)

## 2020-11-07 LAB — GLUCOSE, CAPILLARY
Glucose-Capillary: 109 mg/dL — ABNORMAL HIGH (ref 70–99)
Glucose-Capillary: 259 mg/dL — ABNORMAL HIGH (ref 70–99)
Glucose-Capillary: 145 mg/dL — ABNORMAL HIGH (ref 70–99)

## 2020-11-07 LAB — MAGNESIUM: Magnesium: 2.4 mg/dL (ref 1.7–2.4)

## 2020-11-07 NOTE — Progress Notes (Signed)
HD 16 SUBJECTIVE:  Patient Summary: [name] is a 75 y.o. male (he/him/his) with a pertinent PMH of persistent atrial fibrillation (on Eliquis), complete heart block s/p PPM, grade II HFpEF, hypertension, hypothyroidism, diabetes mellitus, CKD IIIb, who presents to [facility] with altered mental status.   Overnight Events: No overnight events.   Interim History: [name] denies any acute complaints this AM but states he doesn't feel completely well. He cannot characterize it beyond that.   OBJECTIVE:  Vital Signs: Vitals:   11/06/20 0605 11/06/20 1342 11/06/20 2026 11/07/20 0447  BP: (!) 109/49 129/75 (!) 141/68 140/66  Pulse: 72 66 68 61  Resp: 18 20 18 18   Temp: 98 F (36.7 C) 98.3 F (36.8 C) 98.1 F (36.7 C) 97.6 F (36.4 C)  TempSrc:  Oral Oral Oral  SpO2: 99% 98% 99% 99%  Weight:      Height:        Filed Weights   11/04/20 2100 11/05/20 0534 11/06/20 0500  Weight: 97.7 kg 102.3 kg 102.3 kg    Intake/Output Summary (Last 24 hours) at 11/07/2020 0625 Last data filed at 11/07/2020 0451 Gross per 24 hour  Intake 840 ml  Output 150 ml  Net 690 ml    Net IO Since Admission: 558.48 mL [11/07/20 0625]  Physical Exam:  Physical Exam Vitals and nursing note reviewed.  HENT:     Head: Normocephalic.  Eyes:     Extraocular Movements: Extraocular movements intact.  Cardiovascular:     Pulses:          Dorsalis pedis pulses are 1+ on the right side and 1+ on the left side.  Pulmonary:     Effort: Pulmonary effort is normal. No accessory muscle usage or respiratory distress.     Breath sounds: Normal breath sounds. Decreased air movement present.  Musculoskeletal:     Cervical back: Normal range of motion.     Right lower leg: 2+ Pitting Edema (trace) present.     Left lower leg: 2+ Pitting Edema (trace) present.  Skin:    General: Skin is warm and dry.     Comments: Left lower extremity wound healing well with no evidence of erythema. No increased warmth to  touch. Minimal serosanguinous fluid on gauze pad.   Neurological:     Mental Status: He is alert. Mental status is at baseline.     Cranial Nerves: No facial asymmetry.     Motor: Tremor present.    ASSESSMENT/PLAN:  Assessment: Principal Problem:   Acute encephalopathy Active Problems:   Benign prostatic hyperplasia   Type 2 diabetes mellitus with diabetic neuropathy, unspecified (HCC)   Hypothyroidism   Urgency incontinence   Protein-calorie malnutrition, severe  Plan: #Acute Encephalopathy, resolved #Hypoactive Delirium, resolved #Dementia Patient is back to baseline and medically stable for discharge.   - Continue delirium precautions, limiting tubes and lines. - Dysphagia 3 (mechanical soft) diet, per SLP - Will need outpatient MMSE and full dementia evaluation   #HFpEF (Grade II DD) Minimally hypervolemic on examination today.   - Continue home Lasix 20mg  PO  #Protein-calorie malnutrition, severe Patient had continued weight loss concurrent with increasing falls, concerned for underlying dementia, possible, self-neglect, and possible malignancy. Consulted dietitian, appreciate assistance and recommendations.  - Diet and supplements per dietitian  #AoCKDIII Baseline Cr 1.4-1.5.   - Avoid nephrotoxic agents  # LLE Necrotic Ulcer s/p debridement with multiple compartment releases 10/28/2020, 10 Days Post-Op  Secondary to mechanical fall 9/16, poor wound  healing in the setting of diabetes and vascular disease, which resulted in non-healing ulcer with necrosis and gangrene. S/p debridement 10/5 with multiple compartment release.  Antibiotics completed on 10/7. Per ortho, wound vac removed 10/12.  Surgical site is healing well  - Weightbearing as tolerated on the left - PT/OT with weight bearing as tolerated; will need SNF placement   # Acute on chronic macrocytic anemia # Anemia of Chronic Disease Downtrend in hemoglobin over the last 3 months with no clear source.  Despite macrocytic nature, folate and B12 are WNL. Reticulocyte index suggests hypo-proliferation. Hb stable. Outpatient follow up.   # Chronic Urinary Incontinence # Hx of BPH - Continue tamsulosin 0.8 mg daily - Follow-up with urology outpatient   #Type II Diabetes Mellitus HbA1c 7.8%. - Levemir 5U + SSI - CBG monitoring    #Persistent atrial fibrillation on Eliquis #Hx of complete heart block s/p PPM CHA2DS2VASc Score - 5 HASBLED Score- 2.  - Continue Amiodarone - Continue home Eliquis 5mg  BID   # HTN  - Continue home Hydralazine 25mg  q8h   Hypothyroidism - Continue Synthroid 175 mcg  Medical decision making Patient lacks capacity, he is unable to understand the circumstances and apply consequences to self. Also has memory deficits. Of note, per patient's brother [name]), patient has a son whom he has not communicated with for the last 5 to 6 years. Also states that the patient was not part of his son's upbringing.  Because of patient's memory deficit and mental status, this leaves [name] as the only other next of kin, for medical decision making.  Best Practice: Diet: Dys 3, mechanical soft Pain: Acetaminophen 1000mg  TID [place] BM: MiraLAX and Senokot scheduled IVF: Fluids: none VTE: SCDs Start: 10/29/20 1124 Code: Full Therapy Recs: SNF, and DME Rolling walker with 5" wheels, 3 in 1 bedside commode. will need max HHPT services and potential PCA if he continues to refuse SNF  DISPO: Anticipated discharge in 1-2 days days to [facility]) pending [name] (brother) willing to sign for patient  Signature: Dr. Jose Persia Internal Medicine PGY-3  Pager: [phone] After 5pm on weekdays and 1pm on weekends: On Call pager [phone]  11/07/2020, 4:52 PM

## 2020-11-07 NOTE — Progress Notes (Signed)
Nurse Tech found bugs in pt bed, pt bathed , linen changed, closet taped ,  EVS contacted, charge nurse is aware, contact precautions initiated

## 2020-11-08 DIAGNOSIS — G9340 Encephalopathy, unspecified: Secondary | ICD-10-CM | POA: Diagnosis not present

## 2020-11-08 LAB — GLUCOSE, CAPILLARY
Glucose-Capillary: 125 mg/dL — ABNORMAL HIGH (ref 70–99)
Glucose-Capillary: 182 mg/dL — ABNORMAL HIGH (ref 70–99)
Glucose-Capillary: 88 mg/dL (ref 70–99)
Glucose-Capillary: 159 mg/dL — ABNORMAL HIGH (ref 70–99)

## 2020-11-08 NOTE — Progress Notes (Signed)
HD 17 SUBJECTIVE:  Patient Summary: [name] is a 75 y.o. male (he/him/his) with a pertinent PMH of persistent atrial fibrillation (on Eliquis), complete heart block s/p PPM, grade II HFpEF, hypertension, hypothyroidism, diabetes mellitus, CKD IIIb, who presents to [facility] with altered mental status.   Overnight Events: EVS was called because bugs were found on patient.  Interim History: [name] is resting in bed comfortably, in no acute distress. He states he is overall feeling better. Patient states he has changed is mind and wants to go home tomorrow, however he is agreeable to staying today.  Denies chest pain or abdominal pain. His last bowel movement was yesterday.   OBJECTIVE:  Vital Signs: Vitals:   11/07/20 1624 11/07/20 2112 11/08/20 0531 11/08/20 1300  BP: (!) 126/43 128/67 115/62 117/71  Pulse: 66 71 65 74  Resp: 18 18 18 15   Temp: 98.6 F (37 C) 98.3 F (36.8 C) 97.8 F (36.6 C) 98.4 F (36.9 C)  TempSrc:    Oral  SpO2: 99% 97% 97% 100%  Weight:      Height:        Filed Weights   11/04/20 2100 11/05/20 0534 11/06/20 0500  Weight: 97.7 kg 102.3 kg 102.3 kg    Intake/Output Summary (Last 24 hours) at 11/08/2020 1539 Last data filed at 11/08/2020 0703 Gross per 24 hour  Intake 480 ml  Output 575 ml  Net -95 ml   Net IO Since Admission: 823.48 mL [11/08/20 1539]  Physical Exam:  Physical Exam Vitals and nursing note reviewed.  HENT:     Head: Normocephalic.  Eyes:     Extraocular Movements: Extraocular movements intact.  Cardiovascular:     Rate and Rhythm: Normal rate and regular rhythm.     Pulses:          Dorsalis pedis pulses are 1+ on the right side and 1+ on the left side.     Heart sounds: Murmur (Systolic) heard.  Pulmonary:     Effort: Pulmonary effort is normal. No accessory muscle usage or respiratory distress.     Breath sounds: Decreased air movement present. No wheezing.     Comments: Decreased breath sounds on RUL and RLL,  although improved Musculoskeletal:     Cervical back: Normal range of motion.     Right lower leg: Pitting Edema present.     Left lower leg: Edema (trace) present.     Comments: 1+ edema on RLE  Skin:    General: Skin is warm and dry.     Comments: Left lower extremity wound healing well with some surrounding erythema. The wound is unwrapped. No warmth, there is some loculation proximal to the surgical scar, but no purulent or any discharge. Not tender to palpation.   Neurological:     Mental Status: He is alert. Mental status is at baseline.     Cranial Nerves: No facial asymmetry.     Motor: Tremor present.    ASSESSMENT/PLAN:  Assessment: Principal Problem:   Acute encephalopathy Active Problems:   Benign prostatic hyperplasia   Type 2 diabetes mellitus with diabetic neuropathy, unspecified (HCC)   Hypothyroidism   Urgency incontinence   Protein-calorie malnutrition, severe  Plan: #Acute Encephalopathy, resolved #Hypoactive Delirium, resolved #Dementia Patient is back to baseline and medically stable for discharge.  - Continue delirium precautions, limiting tubes and lines. - Dysphagia 3 (mechanical soft) diet, per SLP - Will need outpatient MMSE and full dementia evaluation   #HFpEF (Grade II  DD) Minimally hypervolemic on examination today.  - Continue home Lasix 20mg  PO  #Protein-calorie malnutrition, severe Patient had continued weight loss concurrent with increasing falls, concerned for underlying dementia, possible, self-neglect, and possible malignancy. Consulted dietitian, appreciate assistance and recommendations. - Diet and supplements per dietitian  #AoCKDIII Baseline Cr 1.4-1.5.  - Avoid nephrotoxic agents  # LLE Necrotic Ulcer s/p debridement with multiple compartment releases 10/28/2020, 11 Days Post-Op  Secondary to mechanical fall 9/16, poor wound healing in the setting of diabetes and vascular disease, which resulted in non-healing ulcer with necrosis  and gangrene. S/p debridement 10/5 with multiple compartment release.  Antibiotics completed on 10/7. Per ortho, wound vac removed 10/12.  Surgical site is healing well. There is some surrounding erythema and loculation proximal to the surgical site. However there is no swelling, discharge, non-tender, and patient is afebrile. Will CTM, and consider consulting surgery for possible evacuation or antibiotic if it progresses to cellulitis.  - Weightbearing as tolerated on the left - PT/OT with weight bearing as tolerated; will need SNF placement   # Acute on chronic macrocytic anemia # Anemia of Chronic Disease Downtrend in hemoglobin over the last 3 months with no clear source. Despite macrocytic nature, folate and B12 are WNL. Reticulocyte index suggests hypo-proliferation. Hb stable. Outpatient follow up.   # Chronic Urinary Incontinence # Hx of BPH - Continue tamsulosin 0.8 mg daily - Follow-up with urology outpatient   #Type II Diabetes Mellitus HbA1c 7.8%. - Levemir 5U + SSI - CBG monitoring    #Persistent atrial fibrillation on Eliquis #Hx of complete heart block s/p PPM CHA2DS2VASc Score - 5 HASBLED Score- 2.  - Continue Amiodarone - Continue home Eliquis 5mg  BID   # HTN  - Continue home Hydralazine 25mg  q8h   Hypothyroidism - Continue Synthroid 175 mcg  Medical decision making Patient lacks capacity, he is unable to understand the circumstances and apply consequences to self. Also has memory deficits. Of note, per patient's brother [name]), patient has a son whom he has not communicated with for the last 5 to 6 years. Also states that the patient was not part of his son's upbringing.  Because of patient's memory deficit and mental status, this leaves [name] as the only other next of kin, for medical decision making.  Best Practice: Diet: Dys 3, mechanical soft Pain: Acetaminophen 1000mg  TID [place] BM: MiraLAX and Senokot scheduled IVF: Fluids: none VTE: SCDs Start:  10/29/20 1124 Code: Full Therapy Recs: SNF, and DME Rolling walker with 5" wheels, 3 in 1 bedside commode. will need max HHPT services and potential PCA if he continues to refuse SNF  DISPO: Anticipated discharge in 1-2 days days to [place] [facility]) pending [name] (brother) willing to sign for patient  Signature: Merrily Brittle, DO Psychiatry Resident, PGY-1 [facility] IMTS Pager: [phone]  After 5pm on weekdays and 1pm on weekends: On Call pager [phone]  11/08/2020, 3:39 PM

## 2020-11-09 DIAGNOSIS — I96 Gangrene, not elsewhere classified: Secondary | ICD-10-CM

## 2020-11-09 DIAGNOSIS — R32 Unspecified urinary incontinence: Secondary | ICD-10-CM

## 2020-11-09 DIAGNOSIS — L97928 Non-pressure chronic ulcer of unspecified part of left lower leg with other specified severity: Secondary | ICD-10-CM

## 2020-11-09 DIAGNOSIS — F0390 Unspecified dementia without behavioral disturbance: Secondary | ICD-10-CM

## 2020-11-09 DIAGNOSIS — R1310 Dysphagia, unspecified: Secondary | ICD-10-CM

## 2020-11-09 DIAGNOSIS — E43 Unspecified severe protein-calorie malnutrition: Secondary | ICD-10-CM

## 2020-11-09 DIAGNOSIS — D638 Anemia in other chronic diseases classified elsewhere: Secondary | ICD-10-CM

## 2020-11-09 DIAGNOSIS — E039 Hypothyroidism, unspecified: Secondary | ICD-10-CM

## 2020-11-09 DIAGNOSIS — I5030 Unspecified diastolic (congestive) heart failure: Secondary | ICD-10-CM

## 2020-11-09 DIAGNOSIS — I130 Hypertensive heart and chronic kidney disease with heart failure and stage 1 through stage 4 chronic kidney disease, or unspecified chronic kidney disease: Secondary | ICD-10-CM

## 2020-11-09 DIAGNOSIS — I4819 Other persistent atrial fibrillation: Secondary | ICD-10-CM

## 2020-11-09 DIAGNOSIS — E11622 Type 2 diabetes mellitus with other skin ulcer: Secondary | ICD-10-CM

## 2020-11-09 DIAGNOSIS — Z7901 Long term (current) use of anticoagulants: Secondary | ICD-10-CM

## 2020-11-09 DIAGNOSIS — G9340 Encephalopathy, unspecified: Secondary | ICD-10-CM | POA: Diagnosis not present

## 2020-11-09 DIAGNOSIS — N1832 Chronic kidney disease, stage 3b: Secondary | ICD-10-CM

## 2020-11-09 DIAGNOSIS — E1122 Type 2 diabetes mellitus with diabetic chronic kidney disease: Secondary | ICD-10-CM

## 2020-11-09 DIAGNOSIS — D539 Nutritional anemia, unspecified: Secondary | ICD-10-CM

## 2020-11-09 LAB — CBC WITH DIFFERENTIAL/PLATELET
Abs Immature Granulocytes: 0.04 10*3/uL (ref 0.00–0.07)
Basophils Absolute: 0.1 10*3/uL (ref 0.0–0.1)
Basophils Relative: 1 %
Eosinophils Absolute: 0.3 10*3/uL (ref 0.0–0.5)
Eosinophils Relative: 5 %
HCT: 24.1 % — ABNORMAL LOW (ref 39.0–52.0)
Hemoglobin: 7.9 g/dL — ABNORMAL LOW (ref 13.0–17.0)
Immature Granulocytes: 1 %
Lymphocytes Relative: 23 %
Lymphs Abs: 1.2 10*3/uL (ref 0.7–4.0)
MCH: 32.9 pg (ref 26.0–34.0)
MCHC: 32.8 g/dL (ref 30.0–36.0)
MCV: 100.4 fL — ABNORMAL HIGH (ref 80.0–100.0)
Monocytes Absolute: 0.5 10*3/uL (ref 0.1–1.0)
Monocytes Relative: 11 %
Neutro Abs: 2.9 10*3/uL (ref 1.7–7.7)
Neutrophils Relative %: 59 %
Platelets: 188 10*3/uL (ref 150–400)
RBC: 2.4 MIL/uL — ABNORMAL LOW (ref 4.22–5.81)
RDW: 18.3 % — ABNORMAL HIGH (ref 11.5–15.5)
WBC: 4.9 10*3/uL (ref 4.0–10.5)
nRBC: 0 % (ref 0.0–0.2)

## 2020-11-09 LAB — COMPREHENSIVE METABOLIC PANEL
ALT: 10 U/L (ref 0–44)
AST: 21 U/L (ref 15–41)
Albumin: 2.6 g/dL — ABNORMAL LOW (ref 3.5–5.0)
Alkaline Phosphatase: 47 U/L (ref 38–126)
Anion gap: 8 (ref 5–15)
BUN: 37 mg/dL — ABNORMAL HIGH (ref 8–23)
CO2: 24 mmol/L (ref 22–32)
Calcium: 8.8 mg/dL — ABNORMAL LOW (ref 8.9–10.3)
Chloride: 103 mmol/L (ref 98–111)
Creatinine, Ser: 1.73 mg/dL — ABNORMAL HIGH (ref 0.61–1.24)
GFR, Estimated: 40 mL/min — ABNORMAL LOW (ref >60)
Glucose, Bld: 122 mg/dL — ABNORMAL HIGH (ref 70–99)
Potassium: 4.5 mmol/L (ref 3.5–5.1)
Sodium: 135 mmol/L (ref 135–145)
Total Bilirubin: 0.6 mg/dL (ref 0.3–1.2)
Total Protein: 6 g/dL — ABNORMAL LOW (ref 6.5–8.1)

## 2020-11-09 LAB — SARS CORONAVIRUS 2 (TAT 6-24 HRS): SARS Coronavirus 2: NEGATIVE

## 2020-11-09 LAB — GLUCOSE, CAPILLARY
Glucose-Capillary: 114 mg/dL — ABNORMAL HIGH (ref 70–99)
Glucose-Capillary: 186 mg/dL — ABNORMAL HIGH (ref 70–99)
Glucose-Capillary: 164 mg/dL — ABNORMAL HIGH (ref 70–99)
Glucose-Capillary: 174 mg/dL — ABNORMAL HIGH (ref 70–99)

## 2020-11-09 LAB — MAGNESIUM: Magnesium: 2 mg/dL (ref 1.7–2.4)

## 2020-11-09 MED ORDER — SENNOSIDES-DOCUSATE SODIUM 8.6-50 MG PO TABS
2.0000 | ORAL_TABLET | Freq: Every day | ORAL | Status: DC
  Administered 2020-11-09: 2 via ORAL
  Filled 2020-11-09: qty 2

## 2020-11-09 MED ORDER — ACETAMINOPHEN 325 MG PO TABS
650.0000 mg | ORAL_TABLET | Freq: Three times a day (TID) | ORAL | Status: DC
  Administered 2020-11-09 – 2020-11-10 (×2): 650 mg via ORAL
  Filled 2020-11-09 (×2): qty 2

## 2020-11-09 NOTE — Progress Notes (Addendum)
HD 18 SUBJECTIVE:  Patient Summary: [name] is a 75 y.o. male (he/him/his) with a pertinent PMH of persistent atrial fibrillation (on Eliquis), complete heart block s/p PPM, grade II HFpEF, hypertension, hypothyroidism, diabetes mellitus, CKD IIIb, who presents to [facility] with altered mental status.   Overnight Events: None  Interim History: [name] was evaluated this AM.  He was laying in bed, awake, states that he was not feeling well.  States that he felt tired, and was not able to further explain.  He denied chest pain, abdominal pain, shortness of breath, chills, night sweats, or pain at his surgical site.   OBJECTIVE:  Vital Signs: Vitals:   11/09/20 0732 11/09/20 0734 11/09/20 0953 11/09/20 1445  BP: 108/65 (!) 108/49 (!) 128/56 (!) 152/82  Pulse: 61 60 66   Resp:      Temp: 98 F (36.7 C) 98 F (36.7 C)    TempSrc:      SpO2: 97% 96%    Weight:      Height:        Filed Weights   11/05/20 0534 11/06/20 0500 11/09/20 0500  Weight: 102.3 kg 102.3 kg 102.5 kg    Intake/Output Summary (Last 24 hours) at 11/09/2020 1831 Last data filed at 11/09/2020 0100 Gross per 24 hour  Intake 240 ml  Output 450 ml  Net -210 ml   Net IO Since Admission: 613.48 mL [11/09/20 1831]  Physical Exam:  Physical Exam Vitals and nursing note reviewed.  HENT:     Head: Normocephalic.  Eyes:     Extraocular Movements: Extraocular movements intact.  Cardiovascular:     Rate and Rhythm: Normal rate and regular rhythm.     Pulses:          Dorsalis pedis pulses are 1+ on the right side and 1+ on the left side.     Heart sounds: Murmur (Systolic) heard.  Pulmonary:     Effort: Pulmonary effort is normal. No accessory muscle usage or respiratory distress.     Breath sounds: Decreased air movement present. No wheezing or rales.     Comments: Improved air movement in right upper and lower lung field, however still mildly diminished compared to the left Abdominal:     General:  Bowel sounds are normal.     Palpations: Abdomen is soft.  Musculoskeletal:     Cervical back: Normal range of motion.     Right lower leg: Pitting Edema present.     Left lower leg: Edema (trace) present.     Comments: 1+ edema on RLE  Skin:    General: Skin is warm and dry.     Comments: Left lower extremity wound healing well with some surrounding erythema, no warmth, there is some loculation proximal to the surgical scar, but no purulent or any discharge. Not tender to palpation. Wound has been re-dressed.  Neurological:     Mental Status: He is alert. Mental status is at baseline.     Cranial Nerves: No facial asymmetry.     Motor: Tremor present.    ASSESSMENT/PLAN:  Assessment: Principal Problem:   Acute encephalopathy Active Problems:   Benign prostatic hyperplasia   Type 2 diabetes mellitus with diabetic neuropathy, unspecified (HCC)   Hypothyroidism   Urgency incontinence   Protein-calorie malnutrition, severe  Plan: #Acute Encephalopathy, resolved #Hypoactive Delirium, resolved #Dementia Patient is back to baseline and medically stable for discharge.  - Ramelteon tablet 8 mg qHS - Continue delirium precautions, limiting tubes and  lines. - Dysphagia 3 (mechanical soft) diet, per SLP - Will need outpatient MMSE and full dementia evaluation   #HFpEF (Grade II DD) Minimally hypervolemic on examination today.  - Continue home Lasix 20mg  PO  #Protein-calorie malnutrition, severe Patient had continued weight loss concurrent with increasing falls, concerned for underlying dementia, possible, self-neglect, and possible malignancy. Consulted dietitian, appreciate assistance and recommendations. - Diet and supplements per dietitian  #AoCKDIII Baseline Cr 1.4-1.5.  - Avoid nephrotoxic agents  # LLE Necrotic Ulcer s/p debridement with multiple compartment releases 10/28/2020, 12 Days Post-Op  Secondary to mechanical fall 9/16, poor wound healing in the setting of  diabetes and vascular disease, which resulted in non-healing ulcer with necrosis and gangrene. S/p debridement 10/5 with multiple compartment release.  Antibiotics completed on 10/7. Per ortho, wound vac removed 10/12.  Surgical site is healing well. There is minimal loculation proximal to the surgical site. However there is no swelling, discharge, non-tender, and patient is afebrile.  - Weightbearing as tolerated on the left - PT/OT with weight bearing as tolerated; will need SNF placement   # Acute on chronic macrocytic anemia # Anemia of Chronic Disease Downtrend in hemoglobin over the last 3 months with no clear source. Despite macrocytic nature, folate and B12 are WNL. Reticulocyte index suggests hypo-proliferation. Hb stable. Outpatient follow up.   # Chronic Urinary Incontinence # Hx of BPH - Continue tamsulosin 0.8 mg daily - Follow-up with urology outpatient   #Type II Diabetes Mellitus HbA1c 7.8%. - Levemir 5U + SSI - CBG monitoring    #Persistent atrial fibrillation on Eliquis #Hx of complete heart block s/p PPM CHA2DS2VASc Score - 5 HASBLED Score- 2.  - Continue Amiodarone - Continue home Eliquis 5mg  BID   # HTN  - Continue home Hydralazine 25mg  q8h   Hypothyroidism - Continue Synthroid 175 mcg  Medical decision making Patient lacks capacity, he is unable to understand the circumstances and apply consequences to self. Also has memory deficits. Of note, per patient's brother [name]), patient has a son whom he has not communicated with for the last 5 to 6 years. Also states that the patient was not part of his son's upbringing.  Because of patient's memory deficit and mental status, this leaves [name] as the only other next of kin, for medical decision making.  Best Practice: Diet: Dys 3, mechanical soft Pain: Acetaminophen 650mg  TID [facility] BM: MiraLAX and Senokot 2 tablets scheduled IVF: Fluids: none VTE: SCDs Start: 10/29/20 1124 Code: Full Therapy Recs: SNF, and  DME Rolling walker with 5" wheels, 3 in 1 bedside commode. will need max HHPT services and potential PCA if he continues to refuse SNF  DISPO: Anticipated discharge in 1-2 days days to [place] [facility]) pending [name] (brother) willing to sign for patient  Signature: Merrily Brittle, DO Psychiatry Resident, PGY-1 [facility] IMTS Pager: [phone]  After 5pm on weekdays and 1pm on weekends: On Call pager [phone]  11/09/2020, 6:31 PM

## 2020-11-09 NOTE — TOC Progression Note (Addendum)
Transition of Care [facility]) - Progression Note    Patient Details  Name: SENAI KINGSLEY MRN: [id number] Date of Birth: [birth year]  Transition of Care [facility]) CM/SW [place], RN Phone [phone]  11/09/2020, 8:59 AM  Clinical Narrative:    CM spoke with Bryson Ha liaison for [facility] rehab. Per Bryson Ha facility should be able to accept patient. CM to initiate insurance auth and touch bases with Bryson Ha after Bryson Ha gets to office and makes final review of patients info.   [place] initiated  1300 Updated clinicals uploaded to [employer] for [employer].   [place] still pending. CM called [facility] rehab to make them aware. Will plan for d/c in am  Expected Discharge Plan: [place] Barriers to Discharge: Continued Medical Work up  Expected Discharge Plan and Services Expected Discharge Plan: [place] In-house Referral: NA Discharge Planning Services: CM Consult Post Acute Care Choice: [place] arrangements for the past 2 months: Single Family Home                 DME Arranged: N/A DME Agency: NA       HH Arranged: NA HH Agency: NA         Social Determinants of Health (SDOH) Interventions    Readmission Risk Interventions Readmission Risk Prevention Plan 10/26/2020  Transportation Screening Complete  PCP or Specialist Appt within 3-5 Days Complete  HRI or Home Care Consult Complete  Social Work Consult for [place] Planning/Counseling Complete  Palliative Care Screening Not Applicable  Medication Review Press photographer) Referral to Pharmacy  Some recent data might be hidden

## 2020-11-09 NOTE — Progress Notes (Signed)
Nutrition Follow-up  DOCUMENTATION CODES:  Severe malnutrition in context of chronic illness  INTERVENTION:  -Continue Ensure Enlive po BID, each supplement provides 350 kcal and 20 grams of protein -Continue PROSource PLUS PO 53mls BID, each supplement provides 100 kcals and 15 grams of protein -Continue MVI with minerals daily  NUTRITION DIAGNOSIS:  Severe Malnutrition related to chronic illness (HF and likely dementia) as evidenced by severe fat depletion, severe muscle depletion, percent weight loss. -- ongoing  GOAL:  Patient will meet greater than or equal to 90% of their needs  -- progressing  MONITOR:  PO intake, Supplement acceptance, Diet advancement, Labs, Weight trends, I & O's, Skin  REASON FOR ASSESSMENT:  Consult Assessment of nutrition requirement/status, Poor PO  ASSESSMENT:  Pt with a PMH significant for persistent Afib, complete heart block s/p PPM, grade II HFpEF, HTN, hypothyroidism, DM, CKD IIIb admitted with acute encephalopathy and hyperactive delirium. Pt also with LLE necrotic ulcer s/p debridement 10/5 w/ multiple compartment releases.  Per MD, pt is back to baseline and stable for discharge. Continues to recommend outpt MMSE and full dementia evaluation.   Pt reports feeling overall better and is wanting to discharge today. Denies chest/abdominal pain and reports good appetite. Per RN, pt doing well with supplements. Continue current nutrition plan of care.   PO Intake: 50-100% x last 8 recorded meals (91% average meal intake)  Medications: 39ml Prosource Plus po BID, , Ensure Enlive/Plus BID, lasix, SSI TID w/ meals, 5 units Levemir daily, MVI w/ minerals, miralax, senokot-s Labs: BUN 37 (H), Cr 1.73 (H) CBGs: 88-186 x24 hours  UOP: 596ml x24 hours I/O: +613.87ml since admit  Diet Order:   Diet Order             DIET DYS 3 Room service appropriate? Yes; Fluid consistency: Thin  Diet effective now                  EDUCATION NEEDS:  Not  appropriate for education at this time  Skin:  Skin Assessment: Skin Integrity Issues: Skin Integrity Issues:: Wound VAC, Incisions Wound Vac: Pretibial L Incisions: L leg  Last BM:  10/15 per pt report  Height:  Ht Readings from Last 1 Encounters:  11/04/20 6\' 1"  (1.854 m)   Weight:  Wt Readings from Last 1 Encounters:  11/09/20 102.5 kg   BMI:  Body mass index is 29.81 kg/m.  Estimated Nutritional Needs:  Kcal:  2400-2600 Protein:  120-130 grams Fluid:  >2L    Larkin Ina, MS, RD, LDN (she/her/hers) RD pager number and weekend/on-call pager number located in [place].

## 2020-11-09 NOTE — Progress Notes (Signed)
Physical Therapy Treatment Patient Details Name: [name] MRN: [id number] DOB: January 21, 1946 Today's Date: 11/09/2020   History of Present Illness 75 y.o. M admitted on 10/22/20 due to AMS. On 9/30 pt had an I&D of LLE abcess. 10/5 L LE debridement with compartment releases and application of VAC dressing.   Past medical history of A. 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    PT Comments    Pt continues to have both cognitive and functional deficits that inhibit pt from being safe to return home alone. Pt with decreased insight to safety and deficits, delayed processing, impaired sequencing, memory deficits in addition to impaired balance and increased falls risk. Recommend SNF to allow pt increased time to achieve safe mod I level of function for safe transition home. Acute PT to cont to follow.   Recommendations for follow up therapy are one component of a multi-disciplinary discharge planning process, led by the attending physician.  Recommendations may be updated based on patient status, additional functional criteria and insurance authorization.  Follow Up Recommendations  SNF     Equipment Recommendations  Rolling walker with 5" wheels    Recommendations for Other Services       Precautions / Restrictions Precautions Precautions: Fall Restrictions Weight Bearing Restrictions: No     Mobility  Bed Mobility Overal bed mobility: Needs Assistance Bed Mobility: Sidelying to Sit   Sidelying to sit: Min guard       General bed mobility comments: pt received in R sidelying, brought self up into sitting with max directional verbal cues and incresaed time    Transfers Overall transfer level: Needs assistance Equipment used: Rolling walker (2 wheeled) Transfers: Sit to/from Stand Sit to Stand: Min guard;Min assist         General transfer  comment: max directional verbal cues to push up from bed and to reach back for chair vs pulling up from RW or holding onto walker and falling backwards  Ambulation/Gait Ambulation/Gait assistance: Min guard Gait Distance (Feet): 100 Feet Assistive device: Rolling walker (2 wheeled) Gait Pattern/deviations: Step-through pattern;Decreased stride length;Trunk flexed Gait velocity: decr Gait velocity interpretation: <1.31 ft/sec, indicative of household ambulator General Gait Details: min guard for safety, minA for walker management especially during turns   Chief Strategy Officer    Modified Rankin (Stroke Patients Only)       Balance Overall balance assessment: Needs assistance Sitting-balance support: Feet supported;No upper extremity supported Sitting balance-Leahy Scale: Fair Sitting balance - Comments: pt sits with elbows on knees   Standing balance support: Bilateral upper extremity supported Standing balance-Leahy Scale: Poor Standing balance comment: reliant on RW                            Cognition Arousal/Alertness: Awake/alert Behavior During Therapy: WFL for tasks assessed/performed Overall Cognitive Status: No family/caregiver present to determine baseline cognitive functioning Area of Impairment: Orientation;Attention;Following commands;Safety/judgement;Problem solving                 Orientation Level: Disoriented to;Situation Current Attention Level: Sustained (easily distracted) Memory: Decreased short-term memory Following Commands: Follows one step commands with increased time Safety/Judgement: Decreased awareness of deficits Awareness: Intellectual Problem Solving: Slow processing;Difficulty sequencing;Requires verbal cues;Requires tactile cues General Comments: pt perseverating on if we found his cousins number despite PT  stating "no" pt kept asking. Pt hard time differentiating between L and R. Pt easily  distracted and difficult to stay on task      Exercises General Exercises - Lower Extremity Long Arc Quad: Both;10 reps;Seated (against moderate manual resistance) Heel Slides: AROM;Both;Seated;10 reps (against manual resistance) Hip Flexion/Marching: AROM;Both;10 reps;Seated    General Comments General comments (skin integrity, edema, etc.): SpO2 >97% on RA, pt with noted bilat lower leg pitting edema      Pertinent Vitals/Pain Pain Assessment: No/denies pain    Home Living                      Prior Function            PT Goals (current goals can now be found in the care plan section) Acute Rehab PT Goals Patient Stated Goal: none stated PT Goal Formulation: With patient Time For Goal Achievement: 11/23/20 Potential to Achieve Goals: Good Progress towards PT goals: Progressing toward goals    Frequency    Min 3X/week      PT Plan Discharge plan needs to be updated    Co-evaluation              AM-PAC PT "6 Clicks" Mobility   Outcome Measure  Help needed turning from your back to your side while in a flat bed without using bedrails?: A Little Help needed moving from lying on your back to sitting on the side of a flat bed without using bedrails?: A Little Help needed moving to and from a bed to a chair (including a wheelchair)?: A Little Help needed standing up from a chair using your arms (e.g., wheelchair or bedside chair)?: A Little Help needed to walk in hospital room?: A Little Help needed climbing 3-5 steps with a railing? : A Lot 6 Click Score: 17    End of Session Equipment Utilized During Treatment: Gait belt Activity Tolerance: Patient tolerated treatment well Patient left: in chair;with call bell/phone within reach;with chair alarm set Nurse Communication: Mobility status PT Visit Diagnosis: Unsteadiness on feet (R26.81);Other abnormalities of gait and mobility (R26.89)     Time: 1110-1131 PT Time Calculation (min) (ACUTE ONLY):  21 min  Charges:  $Gait Training: 8-22 mins                     Kittie Plater, PT, DPT Acute Rehabilitation Services Pager #: [phone] Office #: [phone]    [place] Lopes 11/09/2020, 11:57 AM

## 2020-11-10 DIAGNOSIS — I96 Gangrene, not elsewhere classified: Secondary | ICD-10-CM | POA: Diagnosis not present

## 2020-11-10 DIAGNOSIS — J449 Chronic obstructive pulmonary disease, unspecified: Secondary | ICD-10-CM | POA: Diagnosis not present

## 2020-11-10 DIAGNOSIS — Z950 Presence of cardiac pacemaker: Secondary | ICD-10-CM | POA: Diagnosis not present

## 2020-11-10 DIAGNOSIS — E538 Deficiency of other specified B group vitamins: Secondary | ICD-10-CM | POA: Diagnosis not present

## 2020-11-10 DIAGNOSIS — D638 Anemia in other chronic diseases classified elsewhere: Secondary | ICD-10-CM | POA: Diagnosis not present

## 2020-11-10 DIAGNOSIS — I10 Essential (primary) hypertension: Secondary | ICD-10-CM | POA: Diagnosis not present

## 2020-11-10 DIAGNOSIS — E13319 Other specified diabetes mellitus with unspecified diabetic retinopathy without macular edema: Secondary | ICD-10-CM | POA: Diagnosis not present

## 2020-11-10 DIAGNOSIS — I4891 Unspecified atrial fibrillation: Secondary | ICD-10-CM | POA: Diagnosis not present

## 2020-11-10 DIAGNOSIS — R1312 Dysphagia, oropharyngeal phase: Secondary | ICD-10-CM | POA: Diagnosis not present

## 2020-11-10 DIAGNOSIS — E1140 Type 2 diabetes mellitus with diabetic neuropathy, unspecified: Secondary | ICD-10-CM | POA: Diagnosis not present

## 2020-11-10 DIAGNOSIS — N1832 Chronic kidney disease, stage 3b: Secondary | ICD-10-CM | POA: Diagnosis not present

## 2020-11-10 DIAGNOSIS — E785 Hyperlipidemia, unspecified: Secondary | ICD-10-CM | POA: Diagnosis not present

## 2020-11-10 DIAGNOSIS — I5032 Chronic diastolic (congestive) heart failure: Secondary | ICD-10-CM | POA: Diagnosis not present

## 2020-11-10 DIAGNOSIS — R262 Difficulty in walking, not elsewhere classified: Secondary | ICD-10-CM | POA: Diagnosis not present

## 2020-11-10 DIAGNOSIS — Z23 Encounter for immunization: Secondary | ICD-10-CM | POA: Diagnosis not present

## 2020-11-10 DIAGNOSIS — N189 Chronic kidney disease, unspecified: Secondary | ICD-10-CM | POA: Diagnosis not present

## 2020-11-10 DIAGNOSIS — E43 Unspecified severe protein-calorie malnutrition: Secondary | ICD-10-CM | POA: Diagnosis not present

## 2020-11-10 DIAGNOSIS — N179 Acute kidney failure, unspecified: Secondary | ICD-10-CM | POA: Diagnosis not present

## 2020-11-10 DIAGNOSIS — E039 Hypothyroidism, unspecified: Secondary | ICD-10-CM | POA: Diagnosis not present

## 2020-11-10 DIAGNOSIS — G4700 Insomnia, unspecified: Secondary | ICD-10-CM | POA: Diagnosis not present

## 2020-11-10 DIAGNOSIS — I5030 Unspecified diastolic (congestive) heart failure: Secondary | ICD-10-CM | POA: Diagnosis not present

## 2020-11-10 DIAGNOSIS — G9340 Encephalopathy, unspecified: Secondary | ICD-10-CM | POA: Diagnosis not present

## 2020-11-10 DIAGNOSIS — M6281 Muscle weakness (generalized): Secondary | ICD-10-CM | POA: Diagnosis not present

## 2020-11-10 DIAGNOSIS — K219 Gastro-esophageal reflux disease without esophagitis: Secondary | ICD-10-CM | POA: Diagnosis not present

## 2020-11-10 LAB — CBC WITH DIFFERENTIAL/PLATELET
Abs Immature Granulocytes: 0.05 10*3/uL (ref 0.00–0.07)
Basophils Absolute: 0.1 10*3/uL (ref 0.0–0.1)
Basophils Relative: 1 %
Eosinophils Absolute: 0.3 10*3/uL (ref 0.0–0.5)
Eosinophils Relative: 4 %
HCT: 25 % — ABNORMAL LOW (ref 39.0–52.0)
Hemoglobin: 8 g/dL — ABNORMAL LOW (ref 13.0–17.0)
Immature Granulocytes: 1 %
Lymphocytes Relative: 22 %
Lymphs Abs: 1.3 10*3/uL (ref 0.7–4.0)
MCH: 32 pg (ref 26.0–34.0)
MCHC: 32 g/dL (ref 30.0–36.0)
MCV: 100 fL (ref 80.0–100.0)
Monocytes Absolute: 0.6 10*3/uL (ref 0.1–1.0)
Monocytes Relative: 11 %
Neutro Abs: 3.6 10*3/uL (ref 1.7–7.7)
Neutrophils Relative %: 61 %
Platelets: 188 10*3/uL (ref 150–400)
RBC: 2.5 MIL/uL — ABNORMAL LOW (ref 4.22–5.81)
RDW: 18.2 % — ABNORMAL HIGH (ref 11.5–15.5)
WBC: 5.8 10*3/uL (ref 4.0–10.5)
nRBC: 0 % (ref 0.0–0.2)

## 2020-11-10 LAB — BASIC METABOLIC PANEL
Anion gap: 6 (ref 5–15)
BUN: 35 mg/dL — ABNORMAL HIGH (ref 8–23)
CO2: 25 mmol/L (ref 22–32)
Calcium: 8.9 mg/dL (ref 8.9–10.3)
Chloride: 105 mmol/L (ref 98–111)
Creatinine, Ser: 1.64 mg/dL — ABNORMAL HIGH (ref 0.61–1.24)
GFR, Estimated: 43 mL/min — ABNORMAL LOW (ref >60)
Glucose, Bld: 115 mg/dL — ABNORMAL HIGH (ref 70–99)
Potassium: 4.7 mmol/L (ref 3.5–5.1)
Sodium: 136 mmol/L (ref 135–145)

## 2020-11-10 LAB — GLUCOSE, CAPILLARY
Glucose-Capillary: 133 mg/dL — ABNORMAL HIGH (ref 70–99)
Glucose-Capillary: 142 mg/dL — ABNORMAL HIGH (ref 70–99)

## 2020-11-10 LAB — MAGNESIUM: Magnesium: 2.3 mg/dL (ref 1.7–2.4)

## 2020-11-10 MED ORDER — SENNOSIDES-DOCUSATE SODIUM 8.6-50 MG PO TABS: 2.0000 | ORAL_TABLET | Freq: Every day | ORAL | 0 refills | Status: AC

## 2020-11-10 MED ORDER — POLYETHYLENE GLYCOL 3350 17 G PO PACK: 17.0000 g | PACK | Freq: Every day | ORAL | 0 refills | Status: AC

## 2020-11-10 MED ORDER — RAMELTEON 8 MG PO TABS: 8.0000 mg | ORAL_TABLET | Freq: Every day | ORAL | 0 refills | Status: DC

## 2020-11-10 NOTE — Progress Notes (Signed)
Occupational Therapy Treatment Patient Details Name: [name] MRN: [id number] DOB: 03-30-1945 Today's Date: 11/10/2020   History of present illness 75 y.o. M admitted on 10/22/20 due to AMS. On 9/30 pt had an I&D of LLE abcess. 10/5 L LE debridement with compartment releases and application of VAC dressing.   Past medical history of A. 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   OT comments  Patient received in recliner stating he needed to have clothes to put on.  Patient asked therapist to check closet and dresser but only a pair of boxers were found.  Patient was provided a pair of scrub tops and bottoms and non skid socks. Patient was able to donn with min assist and assistance for safety.  Patient performed grooming standing at sink.  Acute OT to continue to follow.    Recommendations for follow up therapy are one component of a multi-disciplinary discharge planning process, led by the attending physician.  Recommendations may be updated based on patient status, additional functional criteria and insurance authorization.    Follow Up Recommendations  SNF    Equipment Recommendations  3 in 1 bedside commode    Recommendations for Other Services      Precautions / Restrictions Precautions Precautions: Fall Restrictions Weight Bearing Restrictions: No       Mobility Bed Mobility               General bed mobility comments: patient received sitting in recliner    Transfers Overall transfer level: Needs assistance Equipment used: Rolling walker (2 wheeled) Transfers: Sit to/from [facility] Sit to Stand: Min guard Stand pivot transfers: Min assist       General transfer comment: multiple cues for safety    Balance Overall balance assessment: Needs assistance Sitting-balance support: Feet supported;No upper extremity  supported Sitting balance-Leahy Scale: Fair     Standing balance support: Single extremity supported;During functional activity Standing balance-Leahy Scale: Poor Standing balance comment: able to stand at sink for grooming                           ADL either performed or assessed with clinical judgement   ADL Overall ADL's : Needs assistance/impaired     Grooming: Min guard;Standing;Oral care;Wash/dry face Grooming Details (indicate cue type and reason): performed while standing with mid guard for safety         Upper Body Dressing : Supervision/safety;Sitting Upper Body Dressing Details (indicate cue type and reason): donned scrub top Lower Body Dressing: Minimal assistance;Sitting/lateral leans;Sit to/from stand Lower Body Dressing Details (indicate cue type and reason): donned non skid socks and scrub bottoms             Functional mobility during ADLs: Min guard;Rolling walker General ADL Comments: patient perseverated on need to get dressed     Vision       Perception     Praxis      Cognition Arousal/Alertness: Awake/alert Behavior During Therapy: WFL for tasks assessed/performed Overall Cognitive Status: No family/caregiver present to determine baseline cognitive functioning Area of Impairment: Orientation;Attention;Following commands;Safety/judgement;Problem solving                 Orientation Level: Disoriented to;Situation Current Attention Level: Sustained Memory: Decreased short-term memory Following Commands: Follows one step commands with increased time Safety/Judgement: Decreased awareness of deficits Awareness: Intellectual Problem Solving: Slow processing;Difficulty  sequencing;Requires verbal cues;Requires tactile cues General Comments: perseverating on clothing in closet even though he was informed many times that the closet was empty        Exercises     Shoulder Instructions       General Comments       Pertinent Vitals/ Pain       Pain Assessment: No/denies pain  Home Living                                          Prior Functioning/Environment              Frequency  Min 2X/week        Progress Toward Goals  OT Goals(current goals can now be found in the care plan section)  Progress towards OT goals: Progressing toward goals  Acute Rehab OT Goals Patient Stated Goal: get out of here OT Goal Formulation: Patient unable to participate in goal setting Time For Goal Achievement: 11/07/20 Potential to Achieve Goals: Good ADL Goals Pt Will Perform Grooming: with modified independence;standing Pt Will Perform Lower Body Bathing: with modified independence;sitting/lateral leans;sit to/from stand Pt Will Perform Lower Body Dressing: with modified independence;sitting/lateral leans;sit to/from stand Pt Will Transfer to Toilet: with modified independence;ambulating Pt Will Perform Toileting - Clothing Manipulation and hygiene: with modified independence;sitting/lateral leans;sit to/from stand  Plan Discharge plan remains appropriate    Co-evaluation                 AM-PAC OT "6 Clicks" Daily Activity     Outcome Measure   Help from another person eating meals?: A Little Help from another person taking care of personal grooming?: A Little Help from another person toileting, which includes using toliet, bedpan, or urinal?: A Little Help from another person bathing (including washing, rinsing, drying)?: A Little Help from another person to put on and taking off regular upper body clothing?: A Little Help from another person to put on and taking off regular lower body clothing?: A Little 6 Click Score: 18    End of Session Equipment Utilized During Treatment: Rolling walker  OT Visit Diagnosis: Unsteadiness on feet (R26.81);Other abnormalities of gait and mobility (R26.89);Muscle weakness (generalized) (M62.81)   Activity Tolerance Patient  tolerated treatment well   Patient Left in chair;with call bell/phone within reach;with chair alarm set   Nurse Communication Mobility status;Other (comment) (informed on patients desire to donn clothing)        Time: 1110-1130 OT Time Calculation (min): 20 min  Charges: OT General Charges $OT Visit: 1 Visit OT Treatments $Self Care/Home Management : 8-22 mins  Lodema Hong, [place]  Pager [phone] Office [place] 11/10/2020, 11:42 AM

## 2020-11-10 NOTE — Plan of Care (Signed)
  Problem: Education: Goal: Knowledge of General Education information will improve Description: Including pain rating scale, medication(s)/side effects and non-pharmacologic comfort measures Outcome: Completed/Met   Problem: Health Behavior/Discharge Planning: Goal: Ability to manage health-related needs will improve Outcome: Completed/Met   Problem: Clinical Measurements: Goal: Ability to maintain clinical measurements within normal limits will improve Outcome: Completed/Met Goal: Will remain free from infection Outcome: Completed/Met Goal: Diagnostic test results will improve Outcome: Completed/Met Goal: Cardiovascular complication will be avoided Outcome: Completed/Met   Problem: Elimination: Goal: Will not experience complications related to urinary retention Outcome: Completed/Met   Problem: Pain Managment: Goal: General experience of comfort will improve Outcome: Completed/Met   Problem: Safety: Goal: Ability to remain free from injury will improve Outcome: Completed/Met   Problem: Skin Integrity: Goal: Risk for impaired skin integrity will decrease Outcome: Completed/Met

## 2020-11-10 NOTE — Progress Notes (Signed)
Report called to [place] at [facility]. Patient is ready for discharge. Waiting on PTAR to arrive.  Albertina [place] E

## 2020-11-10 NOTE — Discharge Summary (Addendum)
Name: [name] MRN: [id number] DOB: January 07, 1946 75 y.o. PCP: Dettinger, Fransisca Kaufmann, MD  Date of Admission: 10/21/2020  8:02 PM Date of Discharge: 11/10/2020 Attending Physician: Dr. Velna Ochs  Discharge Diagnosis: 1. Acute Encephalopathy complicated by Hospital Delirium  2. Dementia  3. Lower extremity necrotic ulcer s/p debridement  4. Acute on Chronic Kidney Disease Stage 3b  5. Acute on Chronic Macrocytic Anemia  6. HFpEF 7. Persistent A. Fib  8. Hypertension  9. Type 2 Diabetes Mellitus  10. Chronic Urinary Incontinence with history of BPH 11. Hypothyroidism   Discharge Medications: Allergies as of 11/10/2020       Reactions   Phenothiazines Anaphylaxis   Actos [pioglitazone] Swelling        Medication List     STOP taking these medications    azithromycin 250 MG tablet Commonly known as: ZITHROMAX   bacitracin ointment   cephALEXin 500 MG capsule Commonly known as: KEFLEX   dexamethasone 2 MG tablet Commonly known as: DECADRON   doxycycline 100 MG capsule Commonly known as: VIBRAMYCIN   gabapentin 300 MG capsule Commonly known as: NEURONTIN   guaiFENesin 600 MG 12 hr tablet Commonly known as: MUCINEX   hydrocortisone cream 1 %   loperamide 2 MG tablet Commonly known as: Imodium A-D   predniSONE 20 MG tablet Commonly known as: DELTASONE       TAKE these medications    acetaminophen 500 MG tablet Commonly known as: TYLENOL Take 1,000 mg by mouth every 6 (six) hours as needed (pain).   albuterol 108 (90 Base) MCG/ACT inhaler Commonly known as: VENTOLIN HFA Inhale 2 puffs into the lungs every 4 (four) hours as needed for shortness of breath or wheezing.   amiodarone 200 MG tablet Commonly known as: PACERONE Take 200 mg by mouth daily.   apixaban 5 MG Tabs tablet Commonly known as: Eliquis Take 1 tablet (5 mg total) by mouth 2 (two) times daily.   cholecalciferol 25 MCG (1000 UNIT) tablet Commonly known as: VITAMIN  D3 Take 1,000 Units by mouth every morning.   diclofenac Sodium 1 % Gel Commonly known as: VOLTAREN Apply 2 g topically 4 (four) times daily as needed (pain). Do not take ibuprofen or aleve while using this gel   Fish Oil 1000 MG Caps Take 1,000 mg by mouth 2 (two) times daily.   furosemide 20 MG tablet Commonly known as: LASIX Take 20 mg by mouth every morning.   hydrALAZINE 25 MG tablet Commonly known as: APRESOLINE Take 25 mg by mouth every 8 (eight) hours.   Levemir FlexTouch 100 UNIT/ML FlexPen Generic drug: insulin detemir Inject 5 Units into the skin daily.   levothyroxine 175 MCG tablet Commonly known as: SYNTHROID Take 1 tablet (175 mcg total) by mouth daily before breakfast.   metFORMIN 1000 MG tablet Commonly known as: GLUCOPHAGE TAKE 1 TABLET BY MOUTH TWICE DAILY WITH A MEAL. What changed: when to take this   Ozempic (1 MG/DOSE) 2 MG/1.5ML Sopn Generic drug: Semaglutide (1 MG/DOSE) Inject 1 mg into the skin once a week.   pantoprazole 40 MG tablet Commonly known as: PROTONIX Take 1 tablet (40 mg total) by mouth daily before breakfast.   polyethylene glycol 17 g packet Commonly known as: MIRALAX / GLYCOLAX Take 17 g by mouth daily.   ramelteon 8 MG tablet Commonly known as: ROZEREM Take 1 tablet (8 mg total) by mouth at bedtime.   senna-docusate 8.6-50 MG tablet Commonly known as: Senokot-S Take 2 tablets by  mouth daily.   simvastatin 40 MG tablet Commonly known as: ZOCOR Take 40 mg by mouth at bedtime. For cholesterol What changed: Another medication with the same name was removed. Continue taking this medication, and follow the directions you see here.   tamsulosin 0.4 MG Caps capsule Commonly known as: FLOMAX TAKE TWO CAPSULES BY MOUTH AT BEDTIME What changed: when to take this   vitamin B-12 500 MCG tablet Commonly known as: CYANOCOBALAMIN Take 500 mcg by mouth daily.               Discharge Care Instructions  (From admission,  onward)           Start     Ordered   11/10/20 0000  Discharge wound care:       Comments: Change dressing daily. Apply dry gauze and wrap with ace band. Keep clean and dry.   11/10/20 0958            Disposition and follow-up:   [name] was discharged from [facility] in Good condition.  At the hospital follow up visit please address:  1.    - Please follow-up with orthopedic surgery - Please follow-up with urology - Recommend further evaluation of dementia - Consider nephrology referral given CKD  2.  Labs / imaging needed at time of follow-up: CBC, BMP, TSH  3.  Pending labs/ test needing follow-up: None  Follow-up Appointments:  Follow-up Information     [name], MD Follow up in 1 week(s).   Specialty: Orthopedic Surgery Contact information: [address] [place] [id number] [place] by problem list: 1. Acute Encephalopathy complicated by Hospital Delirium Prior to admission, patient found on side of the road with altered mental status.  On further evaluation, no metabolic derangements noted.  CT head on admission did not show any acute intracranial abnormalities that may explain AMS. Acute encephalopathy likely secondary to necrotic ulcer. Mentation returned to baseline prior to surgery.  Post surgery hospital course was complicated by delirium that resolved with implementation of a bowel regimen. On discharge, patient is at baseline functioning.   2. Dementia  At patient's baseline, there is evidence of short-term memory and cognitive deficits.  He is unable to comprehend his circumstances, nor apply consequences to self.  Due to this, patient lacks capacity for medical decision-making.  [name] chart review, patient had an MSSE in 2018 with a score of 25 out of 30.  MSSE was not able to be pursued while patient was admitted.  Recommend further evaluation of underlying dementia.  3.  Lower extremity necrotic ulcer s/p debridement  Secondary to a fall approximately 2-3 weeks prior to admission.  He had been seen in the ED on 10/10/2020 as well as 10/16/2020 with multiple rounds of antibiotic (doxycycline initially and then Keflex).  Development of necrotic ulcer likely secondary to poor wound healing in the setting of small vessel disease.  MRI did not show any evidence of osteomyelitis. Pre-op antibiotics included Cefepime and Vancomycin. Orthopedic surgery was consulted and debridement pursued with evacuation of a large hematoma. Wound cultures obtained during operation did not show any growth.  Postop wound care included a negative pressure wound vacuum for 7 days.  On discharge, patient's surgical incision is healing well with no evidence of erythema or purulent drainage.  4. Acute on Chronic Kidney Disease Stage 3b  [name] chart  review, patient has a past medical history of CKD stage IIIb, however renal function acutely worsened during hospitalization.  Likely prerenal in the setting of delirium and poor p.o. intake with continued diuretic use.  Diuretics were held and upon resolution of delirium, patient resumed p.o. intake.  On discharge, creatinine improved to baseline.  5. Acute on Chronic Macrocytic Anemia  Patient has a past medical history of mild macrocytic anemia dating back to 2016 that worsened in April 2022.  Vitamin B12 and folate within normal limits.  Ferritin markedly elevated in the setting of acute illness.  Iron panel consistent with anemia of chronic disease, as well as iron deficiency anemia.  Reticulocytes support anemia of chronic disease with evidence of hypoproliferation. Postoperatively, patient had a decrease in his hemoglobin down to 6.7.  He received 1 unit of packed red blood cells with improvement.  Hemoglobin stable on discharge.  6. HFpEF Patient's diuretics were temporarily held due to poor p.o. intake, however patient subsequently developed worsening  pleural effusions.  He received 2 days of IV Lasix with improvement in respiratory effort and oxygenation requirements.  On discharge, home Lasix 20 mg daily was continued.  7. Persistent A. Fib  Medications prior to admission include Eliquis and amiodarone.  Eliquis was temporarily held due to surgery, however resumed.  Please continue to monitor hemoglobin while on Eliquis.  8. Hypertension  Well-controlled during hospitalization with home hydralazine 25 mg 3 times daily.  9. Type 2 Diabetes Mellitus  A1c on admission is 7.8%.  During admission, patient was managed with sliding scale insulin and Lantus 5 units at bedtime.  On discharge, home medications including metformin, semaglutide, and nighttime Lantus were resumed.  10. Chronic Urinary Incontinence with history of BPH Patient has a longstanding history of BPH with chronic urinary incontinence managed by Dr. Rip Harbour in [place].  During hospital course, urinary retention with overflow incontinence was suspected and regular bladder scans were obtained.  Prevoid bladder scans up to 300 with post void approximately 80.  PSA was obtained and within normal limits.  Recommend outpatient follow-up with urology.  11. Hypothyroidism  TSH elevated at 15 on admission secondary to accidental medication noncompliance; patient was unaware levothyroxine was required to be taken on an empty stomach.  Home Synthroid was pursued on admission with no dosage adjustments.  Discharge Exam:   BP (!) 123/48   Pulse 73   Temp 98 F (36.7 C)   Resp 20   Ht 6\' 1"  (1.854 m)   Wt 105.4 kg   SpO2 96%   BMI 30.66 kg/m  Discharge exam:  Physical Exam Vitals and nursing note reviewed.  Constitutional:      General: He is not in acute distress.    Appearance: He is obese.  HENT:     Head: Normocephalic and atraumatic.     Mouth/Throat:     Mouth: Mucous membranes are moist.     Pharynx: Oropharynx is clear.  Eyes:     Extraocular Movements:  Extraocular movements intact.     Pupils: Pupils are equal, round, and reactive to light.  Cardiovascular:     Rate and Rhythm: Normal rate. Rhythm irregular.     Heart sounds: No murmur heard. Pulmonary:     Effort: Pulmonary effort is normal. No respiratory distress.     Breath sounds: Rales (minimal bibasilar rales) present. No wheezing.     Comments: Poor inspiratory effort. Abdominal:     General: Bowel sounds are normal. There is no distension.  Palpations: Abdomen is soft.     Tenderness: There is no abdominal tenderness.  Musculoskeletal:     Right lower leg: No edema.     Left lower leg: No edema.  Skin:    General: Skin is warm and dry.     Comments: Lower extremity surgical incision wrapped. Appears clean and dry  Neurological:     General: No focal deficit present.     Mental Status: He is alert. Mental status is at baseline. He is disoriented.   Pertinent Labs, Studies, and Procedures:  CBC Latest Ref Rng & Units 11/10/2020 11/09/2020 11/07/2020  WBC 4.0 - 10.5 K/uL 5.8 4.9 5.4  Hemoglobin 13.0 - 17.0 g/dL 8.0(L) 7.9(L) 7.7(L)  Hematocrit 39.0 - 52.0 % 25.0(L) 24.1(L) 24.2(L)  Platelets 150 - 400 K/uL 188 188 183   BMP Latest Ref Rng & Units 11/10/2020 11/09/2020 11/07/2020  Glucose 70 - 99 mg/dL 115(H) 122(H) 105(H)  BUN 8 - 23 mg/dL 35(H) 37(H) 44(H)  Creatinine 0.61 - 1.24 mg/dL 1.64(H) 1.73(H) 1.97(H)  BUN/Creat Ratio 10 - 24 - - -  Sodium 135 - 145 mmol/L 136 135 136  Potassium 3.5 - 5.1 mmol/L 4.7 4.5 4.6  Chloride 98 - 111 mmol/L 105 103 104  CO2 22 - 32 mmol/L 25 24 25   Calcium 8.9 - 10.3 mg/dL 8.9 8.8(L) 8.7(L)   Lab Results  Component Value Date   TSH 15.009 (H) 10/23/2020   Lab Results  Component Value Date   IRON 29 (L) 10/23/2020   TIBC 249 (L) 10/23/2020   FERRITIN 585 (H) 10/23/2020   Lab Results  Component Value Date   HGBA1C 7.8 (H) 10/23/2020   MRI Tib/Fib IMPRESSION: 1. Large peripherally-enhancing fluid collection within  the subcutaneous soft tissues of the anterolateral aspect of the lower leg at the level of the mid to distal fibular diaphysis measuring 12.3 x 3.0 x 7.4 cm. Appearance is most compatible with abscess. 2. No acute osseous abnormality.  No evidence of osteomyelitis. 3. 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 Patchy and confluent areas of decreased attenuation are noted throughout the deep and periventricular white matter of the cerebral hemispheres bilaterally, compatible with chronic microvascular ischemic disease.   No evidence of large-territorial acute infarction. No parenchymal hemorrhage. Similar-appearing posterior fossa cystic lesion with mass effect on the cerebellum. Otherwise no mass lesion. No extra-axial collection.   Otherwise no mass effect or midline shift. No hydrocephalus. Basilar cisterns are patent.   Vascular: No hyperdense vessel. Atherosclerotic calcifications are present within the cavernous internal carotid arteries.   Skull: No acute fracture or focal lesion.   Sinuses/Orbits: Sphenoid sinus mucosal thickening. Paranasal sinuses and mastoid air cells are clear. Bilateral lens replacement. Otherwise the orbits are unremarkable.   Other: None.   IMPRESSION: No acute intracranial abnormality.  Renal Ultrasound IMPRESSION: No hydronephrosis.   Increased cortical echogenicity, findings can be seen in the setting of medical renal disease.  DG CHEST PORT 1 VIEW CLINICAL DATA:  Hypertension.  EXAM: PORTABLE CHEST 1 VIEW  COMPARISON:  October 30, 2020  FINDINGS: Stable postsurgical changes. Stable position of dual lead cardiac pacemaker.  Enlarged cardiac silhouette.  Mediastinal contours appear intact.  Streaky  airspace opacities in the bilateral lower lobes, right greater the left.  Osseous structures are without acute abnormality. Soft tissues  are grossly normal.  IMPRESSION: Streaky airspace opacities in the bilateral lower lobes, right greater the left, may represent atelectasis or peribronchial airspace consolidation.  Electronically Signed   By: Fidela Salisbury M.D.   On: 10/31/2020 13:41  Discharge Instructions: Discharge Instructions     (HEART FAILURE PATIENTS) Call MD:  Anytime you have any of the following symptoms: 1) 3 pound weight gain in 24 hours or 5 pounds in 1 week 2) shortness of breath, with or without a dry hacking cough 3) swelling in the hands, feet or stomach 4) if you have to sleep on extra pillows at night in order to breathe.   Complete by: As directed    AMB Referral to [facility]   Complete by: As directed    Embedded CCM:  [place],  Dettinger, Fransisca Kaufmann, MD  Please assign to [place] Coordinator for chronic care management follow up calls and assess for further needs.  Questions please call:   Natividad Brood, RN BSN [facility] Liaison  [phone] business mobile phone Toll free office [phone]  Fax number: [phone] [email or web address]   Reason for Referral: Embedded Chronic Care Management Services (Dept. specific)   Disease management services needed: Nurse Case Manager   Diagnoses of: Other   Other Diagnosis: LLOS; acute encephalopathy - currently A & O x4, refusing SNF   Expected date of contact: Routine - 30 Days   Call MD for:  difficulty breathing, headache or visual disturbances   Complete by: As directed    Call MD for:  persistant dizziness or light-headedness   Complete by: As directed    Call MD for:  redness, tenderness, or signs of infection (pain, swelling, redness, odor or green/yellow discharge around  incision site)   Complete by: As directed    Call MD for:  temperature >100.4   Complete by: As directed    Diet - low sodium heart healthy   Complete by: As directed    Discharge instructions   Complete by: As directed    Mr. Oisin, [place] were admitted to the hospital after becoming confused on the road. This confusion was likely caused by the wound on your leg. The orthopedic surgeons operated while you were here and the wound is healing great. On discharge, you will be going to a rehab center to work on your strength.   It was a pleasure meeting you and we wish you the best!   Sincerely,  Dr. Charleen Kirks   Discharge wound care:   Complete by: As directed    Change dressing daily. Apply dry gauze and wrap with ace band. Keep clean and dry.   Increase activity slowly   Complete by: As directed        Signed: Dr. Jose Persia Internal Medicine PGY-3  Pager: [phone] After 5pm on weekdays and 1pm on weekends: On Call pager [phone]  11/10/2020, 9:58 AM

## 2020-11-10 NOTE — TOC Transition Note (Signed)
Transition of Care [facility]) - CM/SW Discharge Note   Patient Details  Name: [name] MRN: [id number] Date of Birth: 02/13/1945  Transition of Care [facility]) CM/SW Contact:  Angelita Ingles, RN Phone Number:[phone]  11/10/2020, 12:07 PM   Clinical Narrative:    Insurance auth received. Patient remains ok with discharge to SNF for rehab. Attempted to notify brother but no answer. Transportation has been set up with [facility]. No other needs noted at this time. TOC will sign off. D/c packet is at nurses station.   Please call report to [facility] [phone] Room # 510-2   Final next level of care: Skilled Nursing Facility Barriers to Discharge: No Barriers Identified   Patient Goals and CMS Choice Patient states their goals for this hospitalization and ongoing recovery are:: Patient states that he just wants to get better CMS Medicare.gov Compare Post Acute Care list provided to:: Patient Choice offered to / list presented to : Patient  Discharge Placement              Patient chooses bed at: [facility] Patient to be transferred to facility by: [place] Name of family member notified: Mithran Strike attempted to call no answer    Discharge Plan and Services In-house Referral: NA Discharge Planning Services: CM Consult Post Acute Care Choice: [place]          DME Arranged: N/A DME Agency: NA       HH Arranged: NA HH Agency: NA        Social Determinants of Health (SDOH) Interventions     Readmission Risk Interventions Readmission Risk Prevention Plan 10/26/2020  Transportation Screening Complete  PCP or Specialist Appt within 3-5 Days Complete  HRI or Home Care Consult Complete  Social Work Consult for [place] Planning/Counseling Complete  Palliative Care Screening Not Applicable  Medication Review Press photographer) Referral to Pharmacy  Some recent data might be hidden

## 2020-11-11 DIAGNOSIS — I10 Essential (primary) hypertension: Secondary | ICD-10-CM | POA: Diagnosis not present

## 2020-11-11 DIAGNOSIS — G9340 Encephalopathy, unspecified: Secondary | ICD-10-CM | POA: Diagnosis not present

## 2020-11-11 DIAGNOSIS — N189 Chronic kidney disease, unspecified: Secondary | ICD-10-CM | POA: Diagnosis not present

## 2020-11-11 DIAGNOSIS — I4891 Unspecified atrial fibrillation: Secondary | ICD-10-CM | POA: Diagnosis not present

## 2020-11-11 DIAGNOSIS — I96 Gangrene, not elsewhere classified: Secondary | ICD-10-CM | POA: Diagnosis not present

## 2020-11-11 DIAGNOSIS — I5030 Unspecified diastolic (congestive) heart failure: Secondary | ICD-10-CM | POA: Diagnosis not present

## 2020-11-11 DIAGNOSIS — N179 Acute kidney failure, unspecified: Secondary | ICD-10-CM | POA: Diagnosis not present

## 2020-11-11 DIAGNOSIS — D638 Anemia in other chronic diseases classified elsewhere: Secondary | ICD-10-CM | POA: Diagnosis not present

## 2020-11-12 DIAGNOSIS — I5030 Unspecified diastolic (congestive) heart failure: Secondary | ICD-10-CM | POA: Diagnosis not present

## 2020-11-12 DIAGNOSIS — I96 Gangrene, not elsewhere classified: Secondary | ICD-10-CM | POA: Diagnosis not present

## 2020-11-12 DIAGNOSIS — I10 Essential (primary) hypertension: Secondary | ICD-10-CM | POA: Diagnosis not present

## 2020-11-12 DIAGNOSIS — N189 Chronic kidney disease, unspecified: Secondary | ICD-10-CM | POA: Diagnosis not present

## 2020-11-12 DIAGNOSIS — N179 Acute kidney failure, unspecified: Secondary | ICD-10-CM | POA: Diagnosis not present

## 2020-11-12 DIAGNOSIS — G9340 Encephalopathy, unspecified: Secondary | ICD-10-CM | POA: Diagnosis not present

## 2020-11-12 DIAGNOSIS — D638 Anemia in other chronic diseases classified elsewhere: Secondary | ICD-10-CM | POA: Diagnosis not present

## 2020-11-12 DIAGNOSIS — I4891 Unspecified atrial fibrillation: Secondary | ICD-10-CM | POA: Diagnosis not present

## 2020-11-17 ENCOUNTER — Encounter: Payer: No Typology Code available for payment source | Admitting: Internal Medicine

## 2020-11-23 ENCOUNTER — Ambulatory Visit (INDEPENDENT_AMBULATORY_CARE_PROVIDER_SITE_OTHER): Payer: No Typology Code available for payment source | Admitting: Orthopedic Surgery

## 2020-11-23 ENCOUNTER — Other Ambulatory Visit: Payer: Self-pay

## 2020-11-23 DIAGNOSIS — S8012XA Contusion of left lower leg, initial encounter: Secondary | ICD-10-CM

## 2020-11-23 DIAGNOSIS — M79662 Pain in left lower leg: Secondary | ICD-10-CM

## 2020-11-24 DIAGNOSIS — Z23 Encounter for immunization: Secondary | ICD-10-CM | POA: Diagnosis not present

## 2020-11-26 DIAGNOSIS — I10 Essential (primary) hypertension: Secondary | ICD-10-CM | POA: Diagnosis not present

## 2020-11-26 DIAGNOSIS — I96 Gangrene, not elsewhere classified: Secondary | ICD-10-CM | POA: Diagnosis not present

## 2020-11-26 DIAGNOSIS — E039 Hypothyroidism, unspecified: Secondary | ICD-10-CM | POA: Diagnosis not present

## 2020-11-26 DIAGNOSIS — N189 Chronic kidney disease, unspecified: Secondary | ICD-10-CM | POA: Diagnosis not present

## 2020-11-26 DIAGNOSIS — I4891 Unspecified atrial fibrillation: Secondary | ICD-10-CM | POA: Diagnosis not present

## 2020-11-26 DIAGNOSIS — D638 Anemia in other chronic diseases classified elsewhere: Secondary | ICD-10-CM | POA: Diagnosis not present

## 2020-12-01 ENCOUNTER — Encounter: Payer: Self-pay | Admitting: Orthopedic Surgery

## 2020-12-01 NOTE — Progress Notes (Signed)
Office Visit Note   Patient: [name]           Date of Birth: January 06, 1946           MRN: [id number] Visit Date: 11/23/2020              Requested by: Dettinger, Fransisca Kaufmann, MD [address] [place],  [place] [address] PCP: Dettinger, Fransisca Kaufmann, MD  Chief Complaint  Patient presents with   Left Leg - Routine Post Op    10/28/20 LLE debridement hematoma       HPI: Patient is approximately 4 weeks status postdebridement hematoma left leg.  Assessment & Plan: Visit Diagnoses:  1. Hematoma of left lower leg     Plan: Continue with current wound care follow-up in 1 week.  Follow-Up Instructions: Return in about 1 week (around 11/30/2020).   Ortho Exam  Patient is alert, oriented, no adenopathy, well-dressed, normal affect, normal respiratory effort. Examination there is no redness no cellulitis no odor or drainage no signs of infection  Imaging: No results found. No images are attached to the encounter.  Labs: Lab Results  Component Value Date   HGBA1C 7.8 (H) 10/23/2020   HGBA1C 8.1 (H) 02/17/2020   HGBA1C 7.3 (H) 10/02/2019   ESRSEDRATE 26 (H) 08/16/2017   ESRSEDRATE 22 10/19/2012   CRP <0.8 08/16/2017   LABURIC 5.8 10/19/2012   REPTSTATUS 10/26/2020 FINAL 10/25/2020   GRAMSTAIN  10/23/2020    FEW WBC PRESENT,BOTH PMN AND MONONUCLEAR NO ORGANISMS SEEN    CULT 20,000 COLONIES/mL YEAST (A) 10/25/2020   LABORGA KLEBSIELLA PNEUMONIAE (A) 06/27/2020     Lab Results  Component Value Date   ALBUMIN 2.6 (L) 11/09/2020   ALBUMIN 2.5 (L) 11/01/2020   ALBUMIN 2.7 (L) 10/23/2020    Lab Results  Component Value Date   MG 2.3 11/10/2020   MG 2.0 11/09/2020   MG 2.4 11/07/2020   Lab Results  Component Value Date   VD25OH 41.29 07/08/2019   VD25OH 43.08 01/08/2019   VD25OH 43.0 07/31/2018    No results found for: PREALBUMIN CBC EXTENDED Latest Ref Rng & Units 11/10/2020 11/09/2020 11/07/2020  WBC 4.0 - 10.5 K/uL 5.8 4.9 5.4  RBC 4.22 - 5.81 MIL/uL 2.50(L)  2.40(L) 2.38(L)  HGB 13.0 - 17.0 g/dL 8.0(L) 7.9(L) 7.7(L)  HCT 39.0 - 52.0 % 25.0(L) 24.1(L) 24.2(L)  PLT 150 - 400 K/uL 188 188 183  NEUTROABS 1.7 - 7.7 K/uL 3.6 2.9 3.3  LYMPHSABS 0.7 - 4.0 K/uL 1.3 1.2 1.3     There is no height or weight on file to calculate BMI.  Orders:  No orders of the defined types were placed in this encounter.  No orders of the defined types were placed in this encounter.    Procedures: No procedures performed  Clinical Data: No additional findings.  ROS:  All other systems negative, except as noted in the HPI. Review of Systems  Objective: Vital Signs: There were no vitals taken for this visit.  Specialty Comments:  No specialty comments available.  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) 08/24/2020   Chronic diastolic heart failure ([place]) 08/24/2020   Bilateral lower extremity edema 08/24/2020   C7 cervical fracture ([place]) 08/07/2020   Pacemaker 08/07/2020   Generalized weakness 04/28/2020   Orthostatic hypotension 04/28/2020   Acute cystitis without hematuria    Exudative age-related macular degeneration of right eye with active choroidal neovascularization ([place]) 04/14/2020   Skin tear of left lower leg without complication 64/33/2951   Tinea cruris 04/06/2020   Hospital discharge follow-up 03/13/2020   Ambulatory dysfunction 03/05/2020   Essential tremor 03/05/2020   Ataxia 03/03/2020   Rash 01/03/2020   Lumbar stenosis with neurogenic claudication 11/14/2019   Stage 3b chronic kidney  disease ([place]) 09/18/2019   Syncope and collapse 09/16/2019   Severe nonproliferative diabetic retinopathy of both eyes associated with type 2 diabetes mellitus ([place]) 08/27/2019   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 without macular edema ([place]) 08/27/2019   Posterior vitreous detachment of right eye 08/27/2019   Severe nonproliferative diabetic retinopathy of right eye ([place]) 08/27/2019   Frequency of urination and polyuria 01/29/2019   Coronary artery disease involving native coronary artery of native heart without angina pectoris 01/27/2019   Microalbuminuria 12/24/2018   COPD (chronic obstructive pulmonary disease) ([place]) 06/25/2018   Thrombocytopenia ([place]) 05/09/2018   Diabetic neuropathy ([place]) 01/30/2018   S/P AVR (aortic valve replacement) 11/04/2016   Atrial fibrillation ([place]) 09/27/2016   Symptomatic bradycardia 06/12/2016   Urgency incontinence 11/10/2015   Erectile dysfunction 11/10/2015   Acute lower UTI 05/23/2015   Hypothyroidism 01/15/2015   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   Phimosis 01/15/2015   CHF (congestive heart failure) ([place]) 01/01/2015   B12 deficiency 04/08/2014   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   Type 2 diabetes mellitus with diabetic neuropathy, unspecified ([place]) 03/12/2014   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   GERD (gastroesophageal reflux disease) 11/02/2012   Dysuria 08/28/2012   Anemia 04/02/2012   Obesity 08/04/2010   Benign prostatic hyperplasia 04/15/2010   CAROTID OCCLUSIVE DISEASE 01/26/2010   MURMUR 05/27/2009   Aortic valve disease 05/27/2009   Hyperlipidemia with target LDL less than 100 01/22/2009   Depression 01/22/2009   Essential hypertension 01/22/2009   Past Medical History:  Diagnosis Date   Arthritis    Atrial fibrillation (HCC)    BPH (benign prostatic hypertrophy) 04/15/2010   Cataract    Colon polyps    DEPRESSION 01/22/2009   Heart valve replaced    HYPERCHOLESTEROLEMIA 01/22/2009    HYPERTENSION 01/22/2009   Dr. 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/22/2009   IDDM 01/22/2009   Morbid obesity ([place])    Severe nonproliferative diabetic retinopathy of both eyes ([place]) 08/27/2019   Stage 3b chronic kidney disease ([place]) 09/18/2019   Tremors of nervous system    ?ptsd   Ulcer    Vitreous hemorrhage of left eye ([place]) 08/27/2019    Family History  Problem Relation Age of Onset   Heart disease Father    Congestive Heart Failure Father    Arthritis Father    Diabetes Other    Benign prostatic hyperplasia Brother    Colon cancer Neg Hx    Colon polyps Neg Hx     Past Surgical History:  Procedure Laterality Date   AORTIC VALVE REPLACEMENT  July 2006   #20 stentless [place] porcine valve   APPENDECTOMY     bilateral cataract surg     BIOPSY  05/01/2020   Procedure: BIOPSY;  Surgeon: Eloise Harman, DO;  [facility];  Service: Endoscopy;;   COLONOSCOPY  04/24/2007   [name]: normal   COLONOSCOPY WITH ESOPHAGOGASTRODUODENOSCOPY (EGD) N/A 04/05/2012   normal rectum, somewhat elongated and redundant colon, otherwise normal. Remote history of colon polyps. EGD: normal esophagus, bile-stained gastric mucosa, diffuse patchy gastric erythema, small duodenal bulbar ulcer with associated erosions. Negative H.pylori. H.pylori serology also negative.    COLONOSCOPY WITH PROPOFOL N/A 05/01/2020   Procedure: COLONOSCOPY WITH PROPOFOL;  Surgeon: Eloise Harman, DO;  [facility];  Service: Endoscopy;  Laterality: N/A;   DENTAL SURGERY  05/2004   Dental extractions   ESOPHAGOGASTRODUODENOSCOPY (EGD) WITH PROPOFOL N/A 05/01/2020   Procedure: ESOPHAGOGASTRODUODENOSCOPY (EGD) WITH PROPOFOL;  Surgeon: Eloise Harman, DO;  [facility];  Service: Endoscopy;  Laterality: N/A;   EYE SURGERY Bilateral    cataract   HEEL SPUR SURGERY Bilateral    resection of heel spur   I & D EXTREMITY Left 10/28/2020   Procedure: LEFT LEG DEBRIDEMENT;  Surgeon: [name], MD;  Location: [facility];  Service: Orthopedics;  Laterality: Left;   KNEE ARTHROSCOPY WITH LATERAL MENISECTOMY Right 04/04/2014   Procedure: KNEE ARTHROSCOPY WITH LATERAL MENISECTOMY;  Surgeon: Carole Civil, MD;  Location: [facility];  Service: Orthopedics;  Laterality: Right;   PACEMAKER IMPLANT N/A 06/13/2016   Procedure: Pacemaker Implant- Dual Chamber;  Surgeon: Evans Lance, MD;  Location: [facility];  Service: Cardiovascular;  Laterality: N/A;   PACEMAKER INSERTION  2018   THYROIDECTOMY  03/21/2013   DR [name]   THYROIDECTOMY N/A 03/21/2013   Procedure: THYROIDECTOMY;  Surgeon: [name], MD;  Location: [facility];  Service: General;  Laterality: N/A;   TONSILLECTOMY     Social History   Occupational History   Occupation: Administrator, retired    Fish farm manager: RETIRED  Tobacco Use   Smoking status: Former    Packs/day: 0.00    Years: 12.00    Pack years: 0.00    Types: Cigarettes    Quit date: 09/20/1961    Years since quitting: 59.2   Smokeless tobacco: Never  Vaping Use   Vaping Use: Never used  Substance and Sexual Activity   Alcohol use: Not Currently   Drug use: No   Sexual activity: Never    Comment: Has not smoked in 20 years

## 2020-12-02 ENCOUNTER — Ambulatory Visit: Payer: No Typology Code available for payment source | Admitting: Urology

## 2020-12-03 ENCOUNTER — Telehealth: Payer: Self-pay | Admitting: Family Medicine

## 2020-12-03 NOTE — Telephone Encounter (Signed)
Spoke with patient, he was discharged from rehab facility last week and needs a follow up appointment.  Appointment scheduled with Dr. Warrick Parisian on 12/10/20.

## 2020-12-10 ENCOUNTER — Ambulatory Visit (INDEPENDENT_AMBULATORY_CARE_PROVIDER_SITE_OTHER): Payer: Medicare Other | Admitting: Family Medicine

## 2020-12-10 ENCOUNTER — Ambulatory Visit: Payer: Medicare Other | Admitting: Family Medicine

## 2020-12-10 ENCOUNTER — Telehealth: Payer: Self-pay

## 2020-12-10 ENCOUNTER — Other Ambulatory Visit: Payer: Self-pay

## 2020-12-10 ENCOUNTER — Encounter: Payer: Self-pay | Admitting: Family Medicine

## 2020-12-10 VITALS — BP 135/62 | HR 71 | Ht 73.0 in | Wt 222.0 lb

## 2020-12-10 DIAGNOSIS — S81812D Laceration without foreign body, left lower leg, subsequent encounter: Secondary | ICD-10-CM

## 2020-12-10 DIAGNOSIS — G9340 Encephalopathy, unspecified: Secondary | ICD-10-CM

## 2020-12-10 NOTE — Progress Notes (Signed)
BP 135/62   Pulse 71   Ht _0  (1.854 m)   Wt 222 lb (100.7 kg)   SpO2 100%   BMI 29.29 kg/m    Subjective:   Patient ID: [name], male    DOB: 1945-03-05, 75 y.o.   MRN: [id number]  HPI: [name] is a 75 y.o. male presenting on 12/10/2020 for rehab follow up (Heart attack per pt and two DVT's in left leg)   HPI Hospital/rehab follow-up Patient was admitted on 10/21/2020 and discharged on 11/10/2020 to rehab.  He was in the hospital for acute encephalopathy with delirium and diagnosed with dementia.  This of the patient was found on the side of the road with altered mental status.  They were suspected that he had acute encephalopathy due to necrotic ulcer.  During the hospitalization he had debridement and multiple rounds of antibiotics that was debrided by orthopedic surgery.  He was on a wound VAC for 7 days postop.  During the hospitalization he was found to have acute on chronic kidney injury and acute on chronic anemia.  His left leg was debrided on 10/28/2020. He was at nursing home.   Patient's memory has been gradually worsening and we have been knowing that but it seems to be a lot worse than what it was and his dementia is setting and worse.  Relevant past medical, surgical, family and social history reviewed and updated as indicated. Interim medical history since our last visit reviewed. Allergies and medications reviewed and updated.  Review of Systems  Constitutional:  Negative for chills and fever.  Eyes:  Negative for visual disturbance.  Respiratory:  Negative for shortness of breath and wheezing.   Cardiovascular:  Negative for chest pain and leg swelling.  Musculoskeletal:  Negative for back pain and gait problem.  Skin:  Positive for wound. Negative for color change and rash.  All other systems reviewed and are negative.  Per HPI unless specifically indicated above   Allergies as of 12/10/2020       Reactions   Phenothiazines Anaphylaxis    Actos [pioglitazone] Swelling        Medication List        Accurate as of December 10, 2020 11:26 AM. If you have any questions, ask your nurse or doctor.          STOP taking these medications    albuterol 108 (90 Base) MCG/ACT inhaler Commonly known as: VENTOLIN HFA Stopped by: Fransisca Kaufmann Starlene Consuegra, MD   amiodarone 200 MG tablet Commonly known as: PACERONE Stopped by: [name], MD   Ozempic (1 MG/DOSE) 2 MG/1.5ML Sopn Generic drug: Semaglutide (1 MG/DOSE) Stopped by: Fransisca Kaufmann Yanice Maqueda, MD   ramelteon 8 MG tablet Commonly known as: ROZEREM Stopped by: [name], MD       TAKE these medications    acetaminophen 500 MG tablet Commonly known as: TYLENOL Take 1,000 mg by mouth every 6 (six) hours as needed (pain).   apixaban 5 MG Tabs tablet Commonly known as: Eliquis Take 1 tablet (5 mg total) by mouth 2 (two) times daily.   cholecalciferol 25 MCG (1000 UNIT) tablet Commonly known as: VITAMIN D3 Take 1,000 Units by mouth every morning.   diclofenac Sodium 1 % Gel Commonly known as: VOLTAREN Apply 2 g topically 4 (four) times daily as needed (pain). Do not take ibuprofen or aleve while using this gel   Fish Oil 1000 MG Caps Take 1,000 mg by mouth  2 (two) times daily.   furosemide 20 MG tablet Commonly known as: LASIX Take 20 mg by mouth every morning.   hydrALAZINE 25 MG tablet Commonly known as: APRESOLINE Take 25 mg by mouth every 8 (eight) hours.   Levemir FlexTouch 100 UNIT/ML FlexPen Generic drug: insulin detemir Inject 5 Units into the skin daily.   levothyroxine 175 MCG tablet Commonly known as: SYNTHROID Take 1 tablet (175 mcg total) by mouth daily before breakfast.   metFORMIN 1000 MG tablet Commonly known as: GLUCOPHAGE TAKE 1 TABLET BY MOUTH TWICE DAILY WITH A MEAL. What changed: when to take this   pantoprazole 40 MG tablet Commonly known as: PROTONIX Take 1 tablet (40 mg total) by mouth daily before  breakfast.   polyethylene glycol 17 g packet Commonly known as: MIRALAX / GLYCOLAX Take 17 g by mouth daily.   senna-docusate 8.6-50 MG tablet Commonly known as: Senokot-S Take 2 tablets by mouth daily.   simvastatin 40 MG tablet Commonly known as: ZOCOR Take 40 mg by mouth at bedtime. For cholesterol   tamsulosin 0.4 MG Caps capsule Commonly known as: FLOMAX TAKE TWO CAPSULES BY MOUTH AT BEDTIME What changed: when to take this   vitamin B-12 500 MCG tablet Commonly known as: CYANOCOBALAMIN Take 500 mcg by mouth daily.         Objective:   BP 135/62   Pulse 71   Ht _0  (1.854 m)   Wt 222 lb (100.7 kg)   SpO2 100%   BMI 29.29 kg/m   Wt Readings from Last 3 Encounters:  12/10/20 222 lb (100.7 kg)  11/10/20 232 lb 5.8 oz (105.4 kg)  10/10/20 220 lb (99.8 kg)    Physical Exam Vitals and nursing note reviewed.  Constitutional:      General: He is not in acute distress.    Appearance: He is well-developed. He is not diaphoretic.  Eyes:     General: No scleral icterus.    Conjunctiva/sclera: Conjunctivae normal.  Neck:     Thyroid: No thyromegaly.  Cardiovascular:     Rate and Rhythm: Normal rate and regular rhythm.     Heart sounds: Normal heart sounds. No murmur heard. Pulmonary:     Effort: Pulmonary effort is normal. No respiratory distress.     Breath sounds: Normal breath sounds. No wheezing.  Musculoskeletal:        General: Swelling (2 plus bilateral lower extremity) present. Normal range of motion.     Cervical back: Neck supple.  Lymphadenopathy:     Cervical: No cervical adenopathy.  Skin:    General: Skin is warm and dry.     Findings: No rash.  Neurological:     Mental Status: He is alert and oriented to person, place, and time.     Coordination: Coordination normal.  Psychiatric:        Behavior: Behavior normal.        Cognition and Memory: Memory is impaired. He exhibits impaired recent memory.      Assessment & Plan:   Problem  List Items Addressed This Visit       Nervous and Auditory   Acute encephalopathy - Primary   Relevant Orders   Ambulatory referral to [facility] [place]   AMB Referral to [facility] [place]   CBC with Differential/Platelet   CMP14+EGFR     Musculoskeletal and Integument   Skin tear of left lower leg without complication   Relevant Orders   Ambulatory referral to [place]  AMB Referral to [place]   CBC with Differential/Platelet   CMP14+EGFR    Patient's memory seems to be worsening and is having difficulty taking care of himself we have been concerned about this for some time but it really has worsened since the hospitalization and discharge from rehab facility.  Wound looks okay at this point but concerned that he has a possibility of becoming reinfected and recommended topical antibiotic cream at this point.  Patient does not have any immediate family that can help, he has a brother and his son and it sounds like the son is not in his life at all and the brother cannot drive or leave his house so he cannot come to help take care of him. Follow up plan: Return if symptoms worsen or fail to improve, for Recheck leg and dementia 1-2 weeks.  Counseling provided for all of the vaccine components Orders Placed This Encounter  Procedures   CBC with Differential/Platelet   CMP14+EGFR   Ambulatory referral to [place] Referral to Morrison Bishop Vanderwerf, MD [facility] 12/10/2020, 11:26 AM

## 2020-12-10 NOTE — Chronic Care Management (AMB) (Signed)
  Chronic Care Management   Outreach Note  12/10/2020 Name: JAHZIAH SIMONIN MRN: [id number] DOB: 01-04-1946  ISHAAN VILLAMAR is a 75 y.o. year old male who is a primary care patient of Dettinger, Fransisca Kaufmann, MD. I reached out to [name] by phone today in response to a referral sent by Mr. Lemar Bakos Col[place]'s primary care provider.  An unsuccessful telephone outreach was attempted today. The patient was referred to the case management team for assistance with care management and care coordination.   Follow Up Plan: The care management team will reach out to the patient again over the next 3 days.  If patient returns call to provider office, please advise to call [facility]  at [place], [place], [place], [place] [address] Direct Dial: [phone] [email or web address] Website: [email or web address]

## 2020-12-11 LAB — CMP14+EGFR
ALT: 8 IU/L (ref 0–44)
AST: 15 IU/L (ref 0–40)
Albumin/Globulin Ratio: 1.4 (ref 1.2–2.2)
Albumin: 3.7 g/dL (ref 3.7–4.7)
Alkaline Phosphatase: 62 IU/L (ref 44–121)
BUN/Creatinine Ratio: 22 (ref 10–24)
BUN: 35 mg/dL — ABNORMAL HIGH (ref 8–27)
Bilirubin Total: 0.3 mg/dL (ref 0.0–1.2)
CO2: 21 mmol/L (ref 20–29)
Calcium: 8.4 mg/dL — ABNORMAL LOW (ref 8.6–10.2)
Chloride: 106 mmol/L (ref 96–106)
Creatinine, Ser: 1.57 mg/dL — ABNORMAL HIGH (ref 0.76–1.27)
Globulin, Total: 2.6 g/dL (ref 1.5–4.5)
Glucose: 184 mg/dL — ABNORMAL HIGH (ref 70–99)
Potassium: 5.3 mmol/L — ABNORMAL HIGH (ref 3.5–5.2)
Sodium: 139 mmol/L (ref 134–144)
Total Protein: 6.3 g/dL (ref 6.0–8.5)
eGFR: 45 mL/min/{1.73_m2} — ABNORMAL LOW (ref >59)

## 2020-12-11 LAB — CBC WITH DIFFERENTIAL/PLATELET
Basophils Absolute: 0.1 10*3/uL (ref 0.0–0.2)
Basos: 1 %
EOS (ABSOLUTE): 0.2 10*3/uL (ref 0.0–0.4)
Eos: 3 %
Hematocrit: 26.5 % — ABNORMAL LOW (ref 37.5–51.0)
Hemoglobin: 8.3 g/dL — CL (ref 13.0–17.7)
Immature Grans (Abs): 0 10*3/uL (ref 0.0–0.1)
Immature Granulocytes: 1 %
Lymphocytes Absolute: 1.1 10*3/uL (ref 0.7–3.1)
Lymphs: 17 %
MCH: 31.8 pg (ref 26.6–33.0)
MCHC: 31.3 g/dL — ABNORMAL LOW (ref 31.5–35.7)
MCV: 102 fL — ABNORMAL HIGH (ref 79–97)
Monocytes Absolute: 0.6 10*3/uL (ref 0.1–0.9)
Monocytes: 10 %
Neutrophils Absolute: 4.5 10*3/uL (ref 1.4–7.0)
Neutrophils: 68 %
Platelets: 183 10*3/uL (ref 150–450)
RBC: 2.61 x10E6/uL — CL (ref 4.14–5.80)
RDW: 14.9 % (ref 11.6–15.4)
WBC: 6.5 10*3/uL (ref 3.4–10.8)

## 2020-12-15 ENCOUNTER — Telehealth: Payer: Self-pay | Admitting: Family Medicine

## 2020-12-15 NOTE — Telephone Encounter (Signed)
[facility] would like to add on skilled nursing for disease management and to assess wound.Taking off home health aide, social worker will help with placement Please call back with any questions.

## 2020-12-15 NOTE — Telephone Encounter (Signed)
RTC to [facility] w/ [facility], VO given

## 2020-12-16 NOTE — Chronic Care Management (AMB) (Signed)
  Chronic Care Management   Outreach Note  12/16/2020 Name: [name] MRN: [id number] DOB: [birth year]  [name] is a 75 y.o. year old male who is a primary care patient of Dettinger, Fransisca Kaufmann, MD. I reached out to [name] by phone today in response to a referral sent by [name]'s primary care provider.  A second unsuccessful telephone outreach was attempted today. The patient was referred to the case management team for assistance with care management and care coordination.   Follow Up Plan: The care management team will reach out to the patient again over the next 5 days.  If patient returns call to provider office, please advise to call [facility] at [place], [place], [place], [place] [address] Direct Dial: [phone] [email or web address] Website: [email or web address]

## 2020-12-21 ENCOUNTER — Encounter: Payer: No Typology Code available for payment source | Admitting: Orthopedic Surgery

## 2020-12-21 ENCOUNTER — Telehealth: Payer: Self-pay | Admitting: Family Medicine

## 2020-12-21 NOTE — Telephone Encounter (Signed)
Left message for patient to call back and schedule Medicare Annual Wellness Visit (AWV) to be completed by video or phone.   Last AWV: 07/04/2016  Please schedule at anytime with [facility]  45 minute appointment  Any questions, please contact me at [phone]

## 2020-12-24 ENCOUNTER — Encounter: Payer: Self-pay | Admitting: Family Medicine

## 2020-12-24 ENCOUNTER — Ambulatory Visit: Payer: Medicare Other | Admitting: Family Medicine

## 2020-12-25 ENCOUNTER — Ambulatory Visit: Payer: No Typology Code available for payment source | Admitting: Urology

## 2020-12-25 NOTE — Chronic Care Management (AMB) (Signed)
  Chronic Care Management   Note  12/25/2020 Name: [name] MRN: [id number] DOB: May 11, 1945  [name] is a 75 y.o. year old male who is a primary care patient of Dettinger, Fransisca Kaufmann, MD. I reached out to [name] by phone today in response to a referral sent by [name]'s PCP.  [name] was given information about Chronic Care Management services today including:  CCM service includes personalized support from designated clinical staff supervised by his physician, including individualized plan of care and coordination with other care providers 24/7 contact phone numbers for assistance for urgent and routine care needs. Service will only be billed when office clinical staff spend 20 minutes or more in a month to coordinate care. Only one practitioner may furnish and bill the service in a calendar month. The patient may stop CCM services at any time (effective at the end of the month) by phone call to the office staff. The patient is responsible for co-pay (up to 20% after annual deductible is met) if co-pay is required by the individual health plan.   Patient agreed to services and verbal consent obtained.   Follow up plan: Telephone appointment with care management team member scheduled for:01/06/2021  Noreene Larsson, [place], [place], [place] [address] Direct Dial: [phone] [name] Website: [email or web address]

## 2020-12-25 NOTE — Chronic Care Management (AMB) (Signed)
  Chronic Care Management   Note  12/25/2020 Name: [name] MRN: [id number] DOB: 11/30/1945  [name] is a 75 y.o. year old male who is a primary care patient of Dettinger, Joshua A, MD. I reached out to [name] by phone today in response to a referral sent by [name]'s PCP.  [name] was given information about Chronic Care Management services today including:  CCM service includes personalized support from designated clinical staff supervised by his physician, including individualized plan of care and coordination with other care providers 24/7 contact phone numbers for assistance for urgent and routine care needs. Service will only be billed when office clinical staff spend 20 minutes or more in a month to coordinate care. Only one practitioner may furnish and bill the service in a calendar month. The patient may stop CCM services at any time (effective at the end of the month) by phone call to the office staff. The patient is responsible for co-pay (up to 20% after annual deductible is met) if co-pay is required by the individual health plan.   Patient agreed to services and verbal consent obtained.   Follow up plan: Telephone appointment with care management team member scheduled for:01/06/2021  Ted Leonhart, RMA Care Guide, Embedded Care Coordination [facility]  Care Management  [place], [place] [address] Direct Dial: [phone] [email or web address]: [email or web address]   

## 2020-12-31 ENCOUNTER — Ambulatory Visit: Payer: Medicare Other | Admitting: Family Medicine

## 2020-12-31 ENCOUNTER — Encounter: Payer: Self-pay | Admitting: Family Medicine

## 2021-01-06 ENCOUNTER — Ambulatory Visit (INDEPENDENT_AMBULATORY_CARE_PROVIDER_SITE_OTHER): Payer: Medicare Other | Admitting: Licensed Clinical Social Worker

## 2021-01-06 DIAGNOSIS — I509 Heart failure, unspecified: Secondary | ICD-10-CM

## 2021-01-06 DIAGNOSIS — K219 Gastro-esophageal reflux disease without esophagitis: Secondary | ICD-10-CM

## 2021-01-06 DIAGNOSIS — D649 Anemia, unspecified: Secondary | ICD-10-CM

## 2021-01-06 DIAGNOSIS — I10 Essential (primary) hypertension: Secondary | ICD-10-CM

## 2021-01-06 DIAGNOSIS — R270 Ataxia, unspecified: Secondary | ICD-10-CM

## 2021-01-06 DIAGNOSIS — I2510 Atherosclerotic heart disease of native coronary artery without angina pectoris: Secondary | ICD-10-CM

## 2021-01-06 DIAGNOSIS — E1140 Type 2 diabetes mellitus with diabetic neuropathy, unspecified: Secondary | ICD-10-CM

## 2021-01-06 DIAGNOSIS — I480 Paroxysmal atrial fibrillation: Secondary | ICD-10-CM

## 2021-01-06 DIAGNOSIS — Z794 Long term (current) use of insulin: Secondary | ICD-10-CM

## 2021-01-06 DIAGNOSIS — E538 Deficiency of other specified B group vitamins: Secondary | ICD-10-CM

## 2021-01-06 DIAGNOSIS — E039 Hypothyroidism, unspecified: Secondary | ICD-10-CM

## 2021-01-06 NOTE — Chronic Care Management (AMB) (Signed)
Chronic Care Management    Clinical Social Work Note  01/06/2021 Name: [name] MRN: [id number] DOB: 1945/06/04  [name] is a 75 y.o. year old male who is a primary care patient of [name], [name] Kaufmann, MD. The CCM team was consulted to assist the patient with chronic disease management and/or care coordination needs related to: [employer] .   Engaged with patient and brother of patient, [name], by telephone for initial visit in response to provider referral for social work chronic care management and care coordination services.   Consent to Services:  The patient was given the following information about Chronic Care Management services today, agreed to services, and gave verbal consent: 1. CCM service includes personalized support from designated clinical staff supervised by the primary care provider, including individualized plan of care and coordination with other care providers 2. 24/7 contact phone numbers for assistance for urgent and routine care needs. 3. Service will only be billed when office clinical staff spend 20 minutes or more in a month to coordinate care. 4. Only one practitioner may furnish and bill the service in a calendar month. 5.The patient may stop CCM services at any time (effective at the end of the month) by phone call to the office staff. 6. The patient will be responsible for cost sharing (co-pay) of up to 20% of the service fee (after annual deductible is met). Patient agreed to services and consent obtained.  Patient agreed to services and consent obtained.   Assessment: Review of patient past medical history, allergies, medications, and health status, including review of relevant consultants reports was performed today as part of a comprehensive evaluation and provision of chronic care management and care coordination services.     SDOH (Social Determinants of Health) assessments and interventions performed:  SDOH Interventions     Flowsheet Row Most Recent Value  SDOH Interventions   Stress Interventions Other (Comment)  [client has stress related to managing medical needs. client has memory loss, has confusion]  Social Connections Interventions Other (Comment)  [risk of social isolation. Parents deceased. client has few friends]  Depression Interventions/Treatment  Currently on Treatment        Advanced Directives Status: See Vynca application for related entries.  CCM Care Plan  Allergies  Allergen Reactions   Phenothiazines Anaphylaxis   Actos [Pioglitazone] Swelling    Outpatient Encounter Medications as of 01/06/2021  Medication Sig Note   acetaminophen (TYLENOL) 500 MG tablet Take 1,000 mg by mouth every 6 (six) hours as needed (pain).    apixaban (ELIQUIS) 5 MG TABS tablet Take 1 tablet (5 mg total) by mouth 2 (two) times daily. 10/23/2020: Active med per [facility]   cholecalciferol (VITAMIN D3) 25 MCG (1000 UNIT) tablet Take 1,000 Units by mouth every morning. 10/23/2020: Active med per [facility]   diclofenac Sodium (VOLTAREN) 1 % GEL Apply 2 g topically 4 (four) times daily as needed (pain). Do not take ibuprofen or aleve while using this gel 10/23/2020: Active med per [facility]    furosemide (LASIX) 20 MG tablet Take 20 mg by mouth every morning. 10/23/2020: Active med per [facility]/ #10 filled 08/21/2020 [facility]    hydrALAZINE (APRESOLINE) 25 MG tablet Take 25 mg by mouth every 8 (eight) hours. 10/23/2020: #30/10 days filled 08/21/2020 [facility] - not listed on med list from [facility]   insulin detemir (LEVEMIR FLEXTOUCH) 100 UNIT/ML FlexPen Inject 5 Units into the skin daily. 10/23/2020: No record of this being filled at [place] -  not listed on med list from [facility]   levothyroxine (SYNTHROID) 175 MCG tablet Take 1 tablet (175 mcg total) by mouth daily before breakfast. 10/23/2020: Active med per [facility]/ #30 filled 09/08/2020 [facility]    metFORMIN (GLUCOPHAGE) 1000 MG tablet TAKE 1 TABLET BY MOUTH TWICE DAILY WITH A MEAL.  (Patient taking differently: Take 1,000 mg by mouth in the morning and at bedtime.) 10/23/2020: Active med per [facility]/ #20 filled 08/21/2020 [facility]    Omega-3 Fatty Acids (FISH OIL) 1000 MG CAPS Take 1,000 mg by mouth 2 (two) times daily. 10/23/2020: Active med per [facility]    pantoprazole (PROTONIX) 40 MG tablet Take 1 tablet (40 mg total) by mouth daily before breakfast. 10/23/2020: No record of this being filled at [place] / not listed on med list from [facility]   polyethylene glycol (MIRALAX / GLYCOLAX) 17 g packet Take 17 g by mouth daily.    senna-docusate (SENOKOT-S) 8.6-50 MG tablet Take 2 tablets by mouth daily.    simvastatin (ZOCOR) 40 MG tablet Take 40 mg by mouth at bedtime. For cholesterol 10/23/2020: Active med per [facility]    tamsulosin (FLOMAX) 0.4 MG CAPS capsule TAKE TWO CAPSULES BY MOUTH AT BEDTIME (Patient taking differently: Take 0.8 mg by mouth daily after supper.) 10/23/2020: Active med per [facility]/ #180 filled 07/09/2020 [facility]    vitamin B-12 (CYANOCOBALAMIN) 500 MCG tablet Take 500 mcg by mouth daily. 10/23/2020: #30 filled 05/02/2020 [facility] - not listed on med list from [facility]   No facility-administered encounter medications on file as of 01/06/2021.    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) 08/24/2020   Chronic diastolic heart failure ([place]) 08/24/2020   Bilateral lower extremity edema 08/24/2020   C7 cervical fracture ([place]) 08/07/2020   Pacemaker 08/07/2020   Generalized weakness 04/28/2020   Orthostatic hypotension 04/28/2020   Acute cystitis without  hematuria    Exudative age-related macular degeneration of right eye with active choroidal neovascularization ([place]) 04/14/2020   Skin tear of left lower leg without complication 50/27/7412   Tinea cruris 04/06/2020   Hospital discharge follow-up 03/13/2020   Ambulatory dysfunction 03/05/2020   Essential tremor 03/05/2020   Ataxia 03/03/2020   Rash 01/03/2020   Lumbar stenosis with neurogenic claudication 11/14/2019   Stage 3b chronic kidney disease ([place]) 09/18/2019   Syncope and collapse 09/16/2019   Severe nonproliferative diabetic retinopathy of both eyes associated with type 2 diabetes mellitus ([place]) 08/27/2019   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 without macular edema ([place]) 08/27/2019   Posterior vitreous detachment of right eye 08/27/2019   Severe nonproliferative diabetic retinopathy of right eye ([place]) 08/27/2019   Frequency of urination and polyuria 01/29/2019   Coronary artery disease involving native coronary artery of native heart without angina pectoris 01/27/2019   Microalbuminuria 12/24/2018   COPD (chronic obstructive pulmonary disease) ([place]) 06/25/2018   Thrombocytopenia ([place]) 05/09/2018   Diabetic neuropathy ([place]) 01/30/2018   S/P AVR (aortic valve replacement) 11/04/2016   Atrial fibrillation ([place]) 09/27/2016   Symptomatic bradycardia 06/12/2016   Urgency incontinence 11/10/2015  Erectile dysfunction 11/10/2015   Acute lower UTI 05/23/2015   Hypothyroidism 01/15/2015   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   Phimosis 01/15/2015   CHF (congestive heart failure) ([place]) 01/01/2015   B12 deficiency 04/08/2014   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   Type 2 diabetes mellitus with diabetic neuropathy, unspecified ([place]) 03/12/2014   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   GERD (gastroesophageal reflux disease) 11/02/2012   Dysuria 08/28/2012   Anemia 04/02/2012   Obesity 08/04/2010   Benign prostatic hyperplasia 04/15/2010   CAROTID  OCCLUSIVE DISEASE 01/26/2010   MURMUR 05/27/2009   Aortic valve disease 05/27/2009   Hyperlipidemia with target LDL less than 100 01/22/2009   Depression 01/22/2009   Essential hypertension 01/22/2009    Conditions to be addressed/monitored: monitor client completion of ADLs as he is able  Care Plan : [place]  Updates made by Katha Cabal, LCSW since 01/06/2021 12:00 AM     Problem: Coping Skills (General Plan of Care)      Goal: Coping Skills Enhanced   Start Date: 01/06/2021  Expected End Date: 04/01/2021  This Visit's Progress: Not on track  Priority: High  Note:   Current barriers:   Patient in need of assistance with connecting to community resources for possible help with completing daily ADLs Patient is unable to independently navigate community resource options without care coordination support Memory issues; confusion issues Risk of social isolation Decreased family support  Clinical Goals:  Client to communicate with LCSW in next 30 days to discuss ADLs completion of client Client to communicate with LCSW in next 30 days to discuss home care needs of client, mobility of client, and pain issues of client Client to communicate in next 30 days with RNCM to discuss nursing needs of client  Clinical Interventions:  Collaboration with [name], [name] Kaufmann, MD regarding development and update of comprehensive plan of care as evidenced by provider attestation and co-signature Assessment of needs, barriers of client Discussed client needs with [name], brother of client. [name] said client lived alone. [name] said that client's parents are deceased; [name] said client has few friends. [name] said client was in [employer].  Discussed client memory issues with [name].  [name] said client gets confused. He said client will call him one day and say one thing; [name] said client will call him another day and say something else.   Discussed with  [name] the support to client of CCM program staff From chart review, client wears glasses and has a macular degeneration issue LCSW called client today and spoke via phone with client.  [name], client, said he was eating ok and said he was sleeping fine.  LCSW discussed edema of client. Client did not mention any problems with edema.  Client did not mention using a device to walk.  Discussed transport needs with [name], client. Client said he has a car but it is in need of repairs. He said he was dependent on others to help meet his transport needs Reviewed family support. LCSW discussed  with client the health needs of [name], brother of client Reviewed with client the support available with RN and LCSW in CCM program As conversation continued, client was having more difficulty communicating.  He seemed to get angry about questions.   Per Epic note of Dr. Warrick Parisian, client has Dementia and memory issues, confusion.    Patient Coping Skills:  Has conversation regularly with brother, [name]  Patient Deficits  Memory issues; confusion May have problems completing ADLs  Patient Goals: In next 30 days, patient will: Attend scheduled medical appointments Talk regularly with brother, [name], about needs of client  Follow Up Plan: LCSW to call client or brother, [name] on 03/03/21 at 3:00 PM to assess client needs    [name] MSW, LCSW Licensed Clinical Social Worker [facility] Care Management [phone]

## 2021-01-06 NOTE — Patient Instructions (Addendum)
Visit Information  Patient Goals:  Coping Skills Enhanced. Complete ADLs as able  Time Frame:  Short Term Goal Priority:  High Progress: Not On Track  Start Date:  01/06/21 Expected End Date:  04/01/21  Follow Up Date:  03/03/21 at 3:00 PM  Coping Skills Enhanced. Complete ADLs as able  Patient Coping Skills:  Has conversation regularly with brother, Timoteo Carreiro  Patient Deficits  Memory issues; confusion May have problems completing ADLs  Patient Goals: In next 30 days, patient will: Attend scheduled medical appointments Talk regularly with brother, [name], about needs of client  Follow Up Plan: LCSW to call client or brother, Gradie Ohm on 03/03/21 at 3:00 PM to assess client needs   Norva Riffle.Rosamary Boudreau MSW, LCSW Licensed Clinical Social Worker [facility] Care Management [phone]

## 2021-01-07 ENCOUNTER — Other Ambulatory Visit: Payer: Self-pay

## 2021-01-07 ENCOUNTER — Ambulatory Visit (INDEPENDENT_AMBULATORY_CARE_PROVIDER_SITE_OTHER): Payer: Medicare Other

## 2021-01-07 DIAGNOSIS — Z7984 Long term (current) use of oral hypoglycemic drugs: Secondary | ICD-10-CM

## 2021-01-07 DIAGNOSIS — Z7901 Long term (current) use of anticoagulants: Secondary | ICD-10-CM

## 2021-01-07 DIAGNOSIS — Z9181 History of falling: Secondary | ICD-10-CM

## 2021-01-07 DIAGNOSIS — Z742 Need for assistance at home and no other household member able to render care: Secondary | ICD-10-CM

## 2021-01-07 DIAGNOSIS — M6281 Muscle weakness (generalized): Secondary | ICD-10-CM | POA: Diagnosis not present

## 2021-01-07 DIAGNOSIS — E119 Type 2 diabetes mellitus without complications: Secondary | ICD-10-CM | POA: Diagnosis not present

## 2021-01-07 DIAGNOSIS — G9340 Encephalopathy, unspecified: Secondary | ICD-10-CM

## 2021-01-13 DIAGNOSIS — R404 Transient alteration of awareness: Secondary | ICD-10-CM | POA: Diagnosis not present

## 2021-01-13 DIAGNOSIS — Z743 Need for continuous supervision: Secondary | ICD-10-CM | POA: Diagnosis not present

## 2021-01-23 DIAGNOSIS — E039 Hypothyroidism, unspecified: Secondary | ICD-10-CM

## 2021-01-23 DIAGNOSIS — I480 Paroxysmal atrial fibrillation: Secondary | ICD-10-CM

## 2021-01-23 DIAGNOSIS — D649 Anemia, unspecified: Secondary | ICD-10-CM

## 2021-01-23 DIAGNOSIS — Z794 Long term (current) use of insulin: Secondary | ICD-10-CM

## 2021-01-23 DIAGNOSIS — E1140 Type 2 diabetes mellitus with diabetic neuropathy, unspecified: Secondary | ICD-10-CM

## 2021-01-23 DIAGNOSIS — I10 Essential (primary) hypertension: Secondary | ICD-10-CM

## 2021-01-23 DIAGNOSIS — I509 Heart failure, unspecified: Secondary | ICD-10-CM

## 2021-01-23 DIAGNOSIS — I2510 Atherosclerotic heart disease of native coronary artery without angina pectoris: Secondary | ICD-10-CM

## 2021-01-24 DIAGNOSIS — 419620001 Death: Secondary | SNOMED CT | POA: Diagnosis not present

## 2021-01-24 DEATH — deceased

## 2021-02-01 ENCOUNTER — Ambulatory Visit: Payer: No Typology Code available for payment source | Admitting: Urology

## 2021-02-03 NOTE — Telephone Encounter (Signed)
Will close encounter

## 2021-02-17 ENCOUNTER — Ambulatory Visit: Payer: Medicare Other | Admitting: Family Medicine

## 2021-03-03 ENCOUNTER — Telehealth: Payer: Medicare Other
